# Patient Record
Sex: Female | Born: 1935 | Hispanic: Yes | Marital: Married | Smoking: Never smoker
Health system: Southern US, Community
[De-identification: ages and names within clinical notes are randomized; demographics above are authoritative.]

## PROBLEM LIST (undated history)

## (undated) DIAGNOSIS — C50919 Malignant neoplasm of unspecified site of unspecified female breast: Secondary | ICD-10-CM

## (undated) DIAGNOSIS — N289 Disorder of kidney and ureter, unspecified: Secondary | ICD-10-CM

## (undated) DIAGNOSIS — K219 Gastro-esophageal reflux disease without esophagitis: Secondary | ICD-10-CM

## (undated) DIAGNOSIS — D649 Anemia, unspecified: Secondary | ICD-10-CM

## (undated) DIAGNOSIS — M1990 Unspecified osteoarthritis, unspecified site: Secondary | ICD-10-CM

## (undated) DIAGNOSIS — R5383 Other fatigue: Secondary | ICD-10-CM

## (undated) DIAGNOSIS — E785 Hyperlipidemia, unspecified: Secondary | ICD-10-CM

## (undated) DIAGNOSIS — L0390 Cellulitis, unspecified: Secondary | ICD-10-CM

## (undated) DIAGNOSIS — N184 Chronic kidney disease, stage 4 (severe): Secondary | ICD-10-CM

## (undated) DIAGNOSIS — H3530 Unspecified macular degeneration: Secondary | ICD-10-CM

## (undated) DIAGNOSIS — IMO0002 Reserved for concepts with insufficient information to code with codable children: Secondary | ICD-10-CM

## (undated) DIAGNOSIS — F329 Major depressive disorder, single episode, unspecified: Secondary | ICD-10-CM

## (undated) DIAGNOSIS — F32A Depression, unspecified: Secondary | ICD-10-CM

## (undated) DIAGNOSIS — I10 Essential (primary) hypertension: Secondary | ICD-10-CM

## (undated) DIAGNOSIS — IMO0001 Reserved for inherently not codable concepts without codable children: Secondary | ICD-10-CM

## (undated) HISTORY — DX: Gastro-esophageal reflux disease without esophagitis: K21.9

## (undated) HISTORY — DX: Chronic kidney disease, stage 4 (severe): N18.4

## (undated) HISTORY — DX: Anemia, unspecified: D64.9

## (undated) HISTORY — DX: Malignant neoplasm of unspecified site of unspecified female breast: C50.919

## (undated) HISTORY — DX: Unspecified osteoarthritis, unspecified site: M19.90

## (undated) HISTORY — DX: Disorder of kidney and ureter, unspecified: N28.9

## (undated) HISTORY — DX: Depression, unspecified: F32.A

## (undated) HISTORY — DX: Major depressive disorder, single episode, unspecified: F32.9

## (undated) HISTORY — DX: Hyperlipidemia, unspecified: E78.5

## (undated) HISTORY — DX: Essential (primary) hypertension: I10

## (undated) HISTORY — DX: Unspecified macular degeneration: H35.30

---
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
## 1997-10-11 HISTORY — PX: MASTECTOMY: SHX3

## 1997-10-11 HISTORY — PX: LUNG BIOPSY: SHX232

## 2000-04-17 ENCOUNTER — Inpatient Hospital Stay (HOSPITAL_COMMUNITY): Admission: EM | Admit: 2000-04-17 | Discharge: 2000-04-19 | Payer: Self-pay | Admitting: Emergency Medicine

## 2000-04-17 ENCOUNTER — Encounter: Payer: Self-pay | Admitting: Internal Medicine

## 2000-05-09 ENCOUNTER — Ambulatory Visit (HOSPITAL_BASED_OUTPATIENT_CLINIC_OR_DEPARTMENT_OTHER): Admission: RE | Admit: 2000-05-09 | Discharge: 2000-05-09 | Payer: Self-pay | Admitting: General Surgery

## 2000-06-17 ENCOUNTER — Encounter: Admission: RE | Admit: 2000-06-17 | Discharge: 2000-06-17 | Payer: Self-pay | Admitting: Gynecology

## 2000-06-17 ENCOUNTER — Encounter: Payer: Self-pay | Admitting: Gynecology

## 2000-06-21 ENCOUNTER — Ambulatory Visit (HOSPITAL_COMMUNITY): Admission: RE | Admit: 2000-06-21 | Discharge: 2000-06-21 | Payer: Self-pay | Admitting: Internal Medicine

## 2000-07-06 ENCOUNTER — Encounter: Payer: Self-pay | Admitting: Oncology

## 2000-07-06 ENCOUNTER — Encounter: Admission: RE | Admit: 2000-07-06 | Discharge: 2000-07-06 | Payer: Self-pay | Admitting: Oncology

## 2000-07-14 ENCOUNTER — Encounter: Admission: RE | Admit: 2000-07-14 | Discharge: 2000-08-17 | Payer: Self-pay | Admitting: Oncology

## 2001-06-22 ENCOUNTER — Other Ambulatory Visit: Admission: RE | Admit: 2001-06-22 | Discharge: 2001-06-22 | Payer: Self-pay | Admitting: Gynecology

## 2001-06-28 ENCOUNTER — Encounter: Payer: Self-pay | Admitting: Internal Medicine

## 2001-07-11 ENCOUNTER — Encounter: Payer: Self-pay | Admitting: Oncology

## 2001-07-11 ENCOUNTER — Encounter: Admission: RE | Admit: 2001-07-11 | Discharge: 2001-07-11 | Payer: Self-pay | Admitting: Oncology

## 2002-01-31 ENCOUNTER — Ambulatory Visit (HOSPITAL_COMMUNITY): Admission: RE | Admit: 2002-01-31 | Discharge: 2002-01-31 | Payer: Self-pay | Admitting: Oncology

## 2002-01-31 ENCOUNTER — Encounter: Payer: Self-pay | Admitting: Oncology

## 2002-05-17 ENCOUNTER — Encounter (INDEPENDENT_AMBULATORY_CARE_PROVIDER_SITE_OTHER): Payer: Self-pay | Admitting: Specialist

## 2002-05-17 ENCOUNTER — Ambulatory Visit (HOSPITAL_BASED_OUTPATIENT_CLINIC_OR_DEPARTMENT_OTHER): Admission: RE | Admit: 2002-05-17 | Discharge: 2002-05-17 | Payer: Self-pay | Admitting: Gynecology

## 2002-05-31 ENCOUNTER — Emergency Department (HOSPITAL_COMMUNITY): Admission: EM | Admit: 2002-05-31 | Discharge: 2002-05-31 | Payer: Self-pay | Admitting: Emergency Medicine

## 2002-06-14 ENCOUNTER — Ambulatory Visit (HOSPITAL_COMMUNITY): Admission: RE | Admit: 2002-06-14 | Discharge: 2002-06-14 | Payer: Self-pay | Admitting: Orthopedic Surgery

## 2002-06-14 ENCOUNTER — Encounter: Payer: Self-pay | Admitting: Orthopedic Surgery

## 2002-07-16 ENCOUNTER — Encounter: Payer: Self-pay | Admitting: Oncology

## 2002-07-16 ENCOUNTER — Encounter: Admission: RE | Admit: 2002-07-16 | Discharge: 2002-07-16 | Payer: Self-pay | Admitting: Oncology

## 2003-01-29 ENCOUNTER — Encounter: Payer: Self-pay | Admitting: Oncology

## 2003-01-29 ENCOUNTER — Ambulatory Visit (HOSPITAL_COMMUNITY): Admission: RE | Admit: 2003-01-29 | Discharge: 2003-01-29 | Payer: Self-pay | Admitting: Oncology

## 2003-07-04 ENCOUNTER — Other Ambulatory Visit: Admission: RE | Admit: 2003-07-04 | Discharge: 2003-07-04 | Payer: Self-pay | Admitting: Gynecology

## 2003-08-02 ENCOUNTER — Encounter: Payer: Self-pay | Admitting: Oncology

## 2003-08-02 ENCOUNTER — Encounter: Admission: RE | Admit: 2003-08-02 | Discharge: 2003-08-02 | Payer: Self-pay | Admitting: Oncology

## 2003-08-02 ENCOUNTER — Emergency Department (HOSPITAL_COMMUNITY): Admission: EM | Admit: 2003-08-02 | Discharge: 2003-08-02 | Payer: Self-pay

## 2003-08-03 ENCOUNTER — Emergency Department (HOSPITAL_COMMUNITY): Admission: AD | Admit: 2003-08-03 | Discharge: 2003-08-03 | Payer: Self-pay | Admitting: *Deleted

## 2004-01-26 ENCOUNTER — Emergency Department (HOSPITAL_COMMUNITY): Admission: EM | Admit: 2004-01-26 | Discharge: 2004-01-27 | Payer: Self-pay | Admitting: Emergency Medicine

## 2004-07-06 ENCOUNTER — Other Ambulatory Visit: Admission: RE | Admit: 2004-07-06 | Discharge: 2004-07-06 | Payer: Self-pay | Admitting: Gynecology

## 2004-08-03 ENCOUNTER — Encounter: Admission: RE | Admit: 2004-08-03 | Discharge: 2004-08-03 | Payer: Self-pay | Admitting: Oncology

## 2004-08-19 ENCOUNTER — Ambulatory Visit: Payer: Self-pay | Admitting: Internal Medicine

## 2004-10-16 ENCOUNTER — Ambulatory Visit: Payer: Self-pay | Admitting: Internal Medicine

## 2004-11-09 ENCOUNTER — Ambulatory Visit: Payer: Self-pay | Admitting: Internal Medicine

## 2005-01-08 ENCOUNTER — Ambulatory Visit: Payer: Self-pay | Admitting: Internal Medicine

## 2005-01-15 ENCOUNTER — Ambulatory Visit: Payer: Self-pay | Admitting: Oncology

## 2005-02-01 ENCOUNTER — Ambulatory Visit: Payer: Self-pay | Admitting: Internal Medicine

## 2005-03-01 ENCOUNTER — Ambulatory Visit: Payer: Self-pay | Admitting: Internal Medicine

## 2005-03-31 ENCOUNTER — Ambulatory Visit: Payer: Self-pay | Admitting: Internal Medicine

## 2005-04-02 ENCOUNTER — Encounter: Admission: RE | Admit: 2005-04-02 | Discharge: 2005-04-02 | Payer: Self-pay | Admitting: Internal Medicine

## 2005-05-11 ENCOUNTER — Ambulatory Visit: Payer: Self-pay | Admitting: Internal Medicine

## 2005-05-17 ENCOUNTER — Encounter: Admission: RE | Admit: 2005-05-17 | Discharge: 2005-06-23 | Payer: Self-pay | Admitting: Internal Medicine

## 2005-06-01 ENCOUNTER — Ambulatory Visit: Payer: Self-pay | Admitting: Internal Medicine

## 2005-06-04 ENCOUNTER — Encounter: Admission: RE | Admit: 2005-06-04 | Discharge: 2005-06-04 | Payer: Self-pay | Admitting: Internal Medicine

## 2005-07-12 ENCOUNTER — Other Ambulatory Visit: Admission: RE | Admit: 2005-07-12 | Discharge: 2005-07-12 | Payer: Self-pay | Admitting: Gynecology

## 2005-07-30 ENCOUNTER — Ambulatory Visit: Payer: Self-pay | Admitting: Internal Medicine

## 2005-08-05 ENCOUNTER — Encounter: Admission: RE | Admit: 2005-08-05 | Discharge: 2005-08-05 | Payer: Self-pay | Admitting: Gynecology

## 2005-09-28 ENCOUNTER — Ambulatory Visit: Payer: Self-pay | Admitting: Internal Medicine

## 2006-01-31 ENCOUNTER — Ambulatory Visit: Payer: Self-pay | Admitting: Oncology

## 2006-02-01 LAB — CBC WITH DIFFERENTIAL/PLATELET
BASO%: 0.3 % (ref 0.0–2.0)
Basophils Absolute: 0 10*3/uL (ref 0.0–0.1)
EOS%: 2 % (ref 0.0–7.0)
HCT: 33.1 % — ABNORMAL LOW (ref 34.8–46.6)
HGB: 11.2 g/dL — ABNORMAL LOW (ref 11.6–15.9)
LYMPH%: 27.9 % (ref 14.0–48.0)
MCH: 31.7 pg (ref 26.0–34.0)
MCHC: 33.9 g/dL (ref 32.0–36.0)
MCV: 93.5 fL (ref 81.0–101.0)
MONO%: 5.9 % (ref 0.0–13.0)
NEUT%: 63.9 % (ref 39.6–76.8)
Platelets: 193 10*3/uL (ref 145–400)

## 2006-02-01 LAB — COMPREHENSIVE METABOLIC PANEL
ALT: 24 U/L (ref 0–40)
AST: 28 U/L (ref 0–37)
BUN: 39 mg/dL — ABNORMAL HIGH (ref 6–23)
Calcium: 10 mg/dL (ref 8.4–10.5)
Chloride: 103 mEq/L (ref 96–112)
Creatinine, Ser: 2.1 mg/dL — ABNORMAL HIGH (ref 0.4–1.2)
Total Bilirubin: 0.4 mg/dL (ref 0.3–1.2)

## 2006-06-01 ENCOUNTER — Ambulatory Visit: Payer: Self-pay | Admitting: Internal Medicine

## 2006-06-07 ENCOUNTER — Ambulatory Visit: Payer: Self-pay | Admitting: Internal Medicine

## 2006-06-16 ENCOUNTER — Ambulatory Visit: Payer: Self-pay

## 2006-07-14 ENCOUNTER — Other Ambulatory Visit: Admission: RE | Admit: 2006-07-14 | Discharge: 2006-07-14 | Payer: Self-pay | Admitting: Gynecology

## 2006-08-08 ENCOUNTER — Encounter: Admission: RE | Admit: 2006-08-08 | Discharge: 2006-08-08 | Payer: Self-pay | Admitting: Gynecology

## 2006-08-24 ENCOUNTER — Ambulatory Visit: Payer: Self-pay | Admitting: Internal Medicine

## 2006-12-16 ENCOUNTER — Ambulatory Visit: Payer: Self-pay | Admitting: Internal Medicine

## 2006-12-16 LAB — CONVERTED CEMR LAB
ALT: 25 units/L (ref 0–40)
AST: 31 units/L (ref 0–37)
BUN: 43 mg/dL — ABNORMAL HIGH (ref 6–23)
CO2: 29 meq/L (ref 19–32)
Calcium: 10.4 mg/dL (ref 8.4–10.5)
Chloride: 106 meq/L (ref 96–112)
Creatinine, Ser: 1.8 mg/dL — ABNORMAL HIGH (ref 0.4–1.2)
GFR calc Af Amer: 36 mL/min
GFR calc non Af Amer: 30 mL/min
Glucose, Bld: 132 mg/dL — ABNORMAL HIGH (ref 70–99)
Hgb A1c MFr Bld: 7.1 % — ABNORMAL HIGH (ref 4.6–6.0)
Potassium: 4.6 meq/L (ref 3.5–5.1)
Sodium: 143 meq/L (ref 135–145)

## 2007-01-19 ENCOUNTER — Ambulatory Visit: Payer: Self-pay | Admitting: Internal Medicine

## 2007-01-20 ENCOUNTER — Ambulatory Visit: Payer: Self-pay | Admitting: Oncology

## 2007-01-25 LAB — CBC WITH DIFFERENTIAL/PLATELET
BASO%: 0.5 % (ref 0.0–2.0)
Basophils Absolute: 0 10*3/uL (ref 0.0–0.1)
EOS%: 1.5 % (ref 0.0–7.0)
HCT: 30.8 % — ABNORMAL LOW (ref 34.8–46.6)
HGB: 10.5 g/dL — ABNORMAL LOW (ref 11.6–15.9)
LYMPH%: 36 % (ref 14.0–48.0)
MCH: 31.3 pg (ref 26.0–34.0)
MCHC: 34.2 g/dL (ref 32.0–36.0)
MONO#: 0.6 10*3/uL (ref 0.1–0.9)
NEUT%: 52.2 % (ref 39.6–76.8)
Platelets: 174 10*3/uL (ref 145–400)
lymph#: 2.3 10*3/uL (ref 0.9–3.3)

## 2007-01-25 LAB — COMPREHENSIVE METABOLIC PANEL
BUN: 51 mg/dL — ABNORMAL HIGH (ref 6–23)
CO2: 28 mEq/L (ref 19–32)
Calcium: 9.5 mg/dL (ref 8.4–10.5)
Chloride: 102 mEq/L (ref 96–112)
Creatinine, Ser: 2.34 mg/dL — ABNORMAL HIGH (ref 0.40–1.20)
Total Bilirubin: 0.4 mg/dL (ref 0.3–1.2)

## 2007-01-25 LAB — CANCER ANTIGEN 27.29: CA 27.29: 16 U/mL (ref 0–39)

## 2007-02-15 ENCOUNTER — Ambulatory Visit: Payer: Self-pay | Admitting: Internal Medicine

## 2007-02-28 DIAGNOSIS — E118 Type 2 diabetes mellitus with unspecified complications: Secondary | ICD-10-CM | POA: Insufficient documentation

## 2007-02-28 DIAGNOSIS — E785 Hyperlipidemia, unspecified: Secondary | ICD-10-CM | POA: Insufficient documentation

## 2007-02-28 DIAGNOSIS — D649 Anemia, unspecified: Secondary | ICD-10-CM

## 2007-02-28 DIAGNOSIS — E119 Type 2 diabetes mellitus without complications: Secondary | ICD-10-CM

## 2007-02-28 DIAGNOSIS — M109 Gout, unspecified: Secondary | ICD-10-CM | POA: Insufficient documentation

## 2007-02-28 DIAGNOSIS — I10 Essential (primary) hypertension: Secondary | ICD-10-CM

## 2007-03-16 ENCOUNTER — Ambulatory Visit: Payer: Self-pay | Admitting: Internal Medicine

## 2007-04-10 ENCOUNTER — Encounter (INDEPENDENT_AMBULATORY_CARE_PROVIDER_SITE_OTHER): Payer: Self-pay | Admitting: *Deleted

## 2007-06-12 LAB — CONVERTED CEMR LAB: Pap Smear: NORMAL

## 2007-06-15 ENCOUNTER — Telehealth (INDEPENDENT_AMBULATORY_CARE_PROVIDER_SITE_OTHER): Payer: Self-pay | Admitting: *Deleted

## 2007-07-07 ENCOUNTER — Ambulatory Visit: Payer: Self-pay | Admitting: Internal Medicine

## 2007-07-07 DIAGNOSIS — N259 Disorder resulting from impaired renal tubular function, unspecified: Secondary | ICD-10-CM

## 2007-07-07 DIAGNOSIS — M1990 Unspecified osteoarthritis, unspecified site: Secondary | ICD-10-CM

## 2007-07-07 DIAGNOSIS — C50919 Malignant neoplasm of unspecified site of unspecified female breast: Secondary | ICD-10-CM | POA: Insufficient documentation

## 2007-07-10 LAB — CONVERTED CEMR LAB
ALT: 23 units/L (ref 0–35)
AST: 24 units/L (ref 0–37)
BUN: 55 mg/dL — ABNORMAL HIGH (ref 6–23)
Basophils Absolute: 0 10*3/uL (ref 0.0–0.1)
Basophils Relative: 0.7 % (ref 0.0–1.0)
CO2: 30 meq/L (ref 19–32)
Calcium: 10 mg/dL (ref 8.4–10.5)
Chloride: 105 meq/L (ref 96–112)
Cholesterol: 166 mg/dL (ref 0–200)
Creatinine, Ser: 2 mg/dL — ABNORMAL HIGH (ref 0.4–1.2)
Creatinine,U: 33.8 mg/dL
Eosinophils Absolute: 0.1 10*3/uL (ref 0.0–0.6)
Eosinophils Relative: 1.9 % (ref 0.0–5.0)
Folate: 13.3 ng/mL
GFR calc Af Amer: 32 mL/min
GFR calc non Af Amer: 26 mL/min
Glucose, Bld: 108 mg/dL — ABNORMAL HIGH (ref 70–99)
HCT: 32.4 % — ABNORMAL LOW (ref 36.0–46.0)
HDL: 42.6 mg/dL (ref >39.0)
Hemoglobin: 11.2 g/dL — ABNORMAL LOW (ref 12.0–15.0)
Hgb A1c MFr Bld: 6.8 % — ABNORMAL HIGH (ref 4.6–6.0)
Iron: 68 ug/dL (ref 42–145)
LDL Cholesterol: 83 mg/dL (ref 0–99)
Lymphocytes Relative: 34.2 % (ref 12.0–46.0)
MCHC: 34.4 g/dL (ref 30.0–36.0)
MCV: 91.9 fL (ref 78.0–100.0)
Microalb Creat Ratio: 5.9 mg/g (ref 0.0–30.0)
Microalb, Ur: 0.2 mg/dL (ref 0.0–1.9)
Monocytes Absolute: 0.4 10*3/uL (ref 0.2–0.7)
Monocytes Relative: 7.5 % (ref 3.0–11.0)
Neutro Abs: 3.3 10*3/uL (ref 1.4–7.7)
Neutrophils Relative %: 55.7 % (ref 43.0–77.0)
Platelets: 172 10*3/uL (ref 150–400)
Potassium: 4.8 meq/L (ref 3.5–5.1)
RBC: 3.53 M/uL — ABNORMAL LOW (ref 3.87–5.11)
RDW: 15 % — ABNORMAL HIGH (ref 11.5–14.6)
Sodium: 141 meq/L (ref 135–145)
TSH: 1.87 microintl units/mL (ref 0.35–5.50)
Total CHOL/HDL Ratio: 3.9
Triglycerides: 200 mg/dL — ABNORMAL HIGH (ref 0–149)
VLDL: 40 mg/dL (ref 0–40)
Vitamin B-12: 199 pg/mL — ABNORMAL LOW (ref 211–911)
WBC: 5.8 10*3/uL (ref 4.5–10.5)

## 2007-07-21 ENCOUNTER — Encounter: Payer: Self-pay | Admitting: Internal Medicine

## 2007-08-10 ENCOUNTER — Encounter: Admission: RE | Admit: 2007-08-10 | Discharge: 2007-08-10 | Payer: Self-pay | Admitting: Gynecology

## 2007-08-10 ENCOUNTER — Encounter: Payer: Self-pay | Admitting: Internal Medicine

## 2007-08-14 ENCOUNTER — Encounter (INDEPENDENT_AMBULATORY_CARE_PROVIDER_SITE_OTHER): Payer: Self-pay | Admitting: *Deleted

## 2007-12-19 ENCOUNTER — Ambulatory Visit: Payer: Self-pay | Admitting: Internal Medicine

## 2007-12-19 DIAGNOSIS — K219 Gastro-esophageal reflux disease without esophagitis: Secondary | ICD-10-CM | POA: Insufficient documentation

## 2007-12-22 LAB — CONVERTED CEMR LAB
BUN: 45 mg/dL — ABNORMAL HIGH (ref 6–23)
CO2: 28 meq/L (ref 19–32)
Calcium: 10.1 mg/dL (ref 8.4–10.5)
Chloride: 107 meq/L (ref 96–112)
Creatinine, Ser: 2 mg/dL — ABNORMAL HIGH (ref 0.4–1.2)
Creatinine,U: 30.2 mg/dL
GFR calc Af Amer: 32 mL/min
GFR calc non Af Amer: 26 mL/min
Glucose, Bld: 107 mg/dL — ABNORMAL HIGH (ref 70–99)
Hgb A1c MFr Bld: 6.7 % — ABNORMAL HIGH (ref 4.6–6.0)
Microalb Creat Ratio: 33.1 mg/g — ABNORMAL HIGH (ref 0.0–30.0)
Microalb, Ur: 1 mg/dL (ref 0.0–1.9)
Potassium: 4.5 meq/L (ref 3.5–5.1)
Sodium: 142 meq/L (ref 135–145)

## 2008-01-29 ENCOUNTER — Ambulatory Visit: Payer: Self-pay | Admitting: Oncology

## 2008-01-31 ENCOUNTER — Encounter: Payer: Self-pay | Admitting: Internal Medicine

## 2008-01-31 LAB — COMPREHENSIVE METABOLIC PANEL
ALT: 19 U/L (ref 0–35)
AST: 20 U/L (ref 0–37)
Alkaline Phosphatase: 65 U/L (ref 39–117)
CO2: 27 mEq/L (ref 19–32)
Creatinine, Ser: 2.1 mg/dL — ABNORMAL HIGH (ref 0.40–1.20)
Sodium: 139 mEq/L (ref 135–145)
Total Bilirubin: 0.4 mg/dL (ref 0.3–1.2)
Total Protein: 7.7 g/dL (ref 6.0–8.3)

## 2008-01-31 LAB — CBC WITH DIFFERENTIAL/PLATELET
BASO%: 0.1 % (ref 0.0–2.0)
EOS%: 2.6 % (ref 0.0–7.0)
LYMPH%: 32.7 % (ref 14.0–48.0)
MCH: 31.5 pg (ref 26.0–34.0)
MCHC: 34.2 g/dL (ref 32.0–36.0)
MONO#: 0.4 10*3/uL (ref 0.1–0.9)
Platelets: 204 10*3/uL (ref 145–400)
RBC: 3.6 10*6/uL — ABNORMAL LOW (ref 3.70–5.32)
WBC: 5.5 10*3/uL (ref 3.9–10.0)
lymph#: 1.8 10*3/uL (ref 0.9–3.3)

## 2008-01-31 LAB — CANCER ANTIGEN 27.29: CA 27.29: 14 U/mL (ref 0–39)

## 2008-03-25 ENCOUNTER — Telehealth (INDEPENDENT_AMBULATORY_CARE_PROVIDER_SITE_OTHER): Payer: Self-pay | Admitting: *Deleted

## 2008-04-01 ENCOUNTER — Ambulatory Visit: Payer: Self-pay | Admitting: Internal Medicine

## 2008-04-01 DIAGNOSIS — E538 Deficiency of other specified B group vitamins: Secondary | ICD-10-CM | POA: Insufficient documentation

## 2008-04-04 ENCOUNTER — Encounter (INDEPENDENT_AMBULATORY_CARE_PROVIDER_SITE_OTHER): Payer: Self-pay | Admitting: *Deleted

## 2008-04-04 LAB — CONVERTED CEMR LAB
Creatinine, Ser: 1.9 mg/dL — ABNORMAL HIGH (ref 0.4–1.2)
Creatinine,U: 28.2 mg/dL
Folate: >20 ng/mL
Hgb A1c MFr Bld: 6.6 % — ABNORMAL HIGH (ref 4.6–6.0)
Microalb, Ur: 0.2 mg/dL (ref 0.0–1.9)
Vitamin B-12: 1101 pg/mL — ABNORMAL HIGH (ref 211–911)

## 2008-04-18 ENCOUNTER — Encounter (INDEPENDENT_AMBULATORY_CARE_PROVIDER_SITE_OTHER): Payer: Self-pay | Admitting: *Deleted

## 2008-07-22 ENCOUNTER — Encounter: Payer: Self-pay | Admitting: Internal Medicine

## 2008-08-12 ENCOUNTER — Encounter: Admission: RE | Admit: 2008-08-12 | Discharge: 2008-08-12 | Payer: Self-pay | Admitting: Internal Medicine

## 2008-08-19 ENCOUNTER — Telehealth (INDEPENDENT_AMBULATORY_CARE_PROVIDER_SITE_OTHER): Payer: Self-pay | Admitting: *Deleted

## 2008-09-09 ENCOUNTER — Telehealth (INDEPENDENT_AMBULATORY_CARE_PROVIDER_SITE_OTHER): Payer: Self-pay | Admitting: *Deleted

## 2008-11-08 ENCOUNTER — Telehealth (INDEPENDENT_AMBULATORY_CARE_PROVIDER_SITE_OTHER): Payer: Self-pay | Admitting: *Deleted

## 2008-11-27 ENCOUNTER — Ambulatory Visit: Payer: Self-pay | Admitting: Internal Medicine

## 2008-11-28 ENCOUNTER — Ambulatory Visit: Payer: Self-pay | Admitting: Internal Medicine

## 2008-12-03 ENCOUNTER — Telehealth (INDEPENDENT_AMBULATORY_CARE_PROVIDER_SITE_OTHER): Payer: Self-pay | Admitting: *Deleted

## 2008-12-03 LAB — CONVERTED CEMR LAB
ALT: 21 units/L (ref 0–35)
AST: 24 units/L (ref 0–37)
BUN: 52 mg/dL — ABNORMAL HIGH (ref 6–23)
Basophils Absolute: 0 10*3/uL (ref 0.0–0.1)
Basophils Relative: 0.6 % (ref 0.0–3.0)
CO2: 28 meq/L (ref 19–32)
Calcium: 9.5 mg/dL (ref 8.4–10.5)
Chloride: 106 meq/L (ref 96–112)
Cholesterol: 144 mg/dL (ref 0–200)
Creatinine, Ser: 2.1 mg/dL — ABNORMAL HIGH (ref 0.4–1.2)
Eosinophils Absolute: 0.2 10*3/uL (ref 0.0–0.7)
Eosinophils Relative: 2.5 % (ref 0.0–5.0)
GFR calc Af Amer: 30 mL/min
GFR calc non Af Amer: 25 mL/min
Glucose, Bld: 131 mg/dL — ABNORMAL HIGH (ref 70–99)
HCT: 33.1 % — ABNORMAL LOW (ref 36.0–46.0)
HDL: 47 mg/dL (ref >39.0)
Hemoglobin: 11 g/dL — ABNORMAL LOW (ref 12.0–15.0)
Hgb A1c MFr Bld: 6.5 % — ABNORMAL HIGH (ref 4.6–6.0)
LDL Cholesterol: 77 mg/dL (ref 0–99)
Lymphocytes Relative: 30.8 % (ref 12.0–46.0)
MCHC: 33.3 g/dL (ref 30.0–36.0)
MCV: 94.5 fL (ref 78.0–100.0)
Monocytes Absolute: 0.4 10*3/uL (ref 0.1–1.0)
Monocytes Relative: 5.8 % (ref 3.0–12.0)
Neutro Abs: 3.8 10*3/uL (ref 1.4–7.7)
Neutrophils Relative %: 60.3 % (ref 43.0–77.0)
Platelets: 161 10*3/uL (ref 150–400)
Potassium: 4.8 meq/L (ref 3.5–5.1)
RBC: 3.51 M/uL — ABNORMAL LOW (ref 3.87–5.11)
RDW: 15 % — ABNORMAL HIGH (ref 11.5–14.6)
Sodium: 141 meq/L (ref 135–145)
Total CHOL/HDL Ratio: 3.1
Triglycerides: 100 mg/dL (ref 0–149)
VLDL: 20 mg/dL (ref 0–40)
WBC: 6.4 10*3/uL (ref 4.5–10.5)

## 2009-01-03 ENCOUNTER — Ambulatory Visit: Payer: Self-pay | Admitting: Internal Medicine

## 2009-01-27 ENCOUNTER — Ambulatory Visit: Payer: Self-pay | Admitting: Internal Medicine

## 2009-01-28 ENCOUNTER — Ambulatory Visit: Payer: Self-pay | Admitting: Oncology

## 2009-01-30 ENCOUNTER — Encounter: Payer: Self-pay | Admitting: Internal Medicine

## 2009-01-30 LAB — CBC WITH DIFFERENTIAL/PLATELET
Basophils Absolute: 0 10*3/uL (ref 0.0–0.1)
Eosinophils Absolute: 0.1 10*3/uL (ref 0.0–0.5)
HCT: 33.3 % — ABNORMAL LOW (ref 34.8–46.6)
MCV: 93.4 fL (ref 79.5–101.0)
MONO#: 0.5 10*3/uL (ref 0.1–0.9)
Platelets: 180 10*3/uL (ref 145–400)
RDW: 15.7 % — ABNORMAL HIGH (ref 11.2–14.5)
lymph#: 2.4 10*3/uL (ref 0.9–3.3)

## 2009-01-31 LAB — COMPREHENSIVE METABOLIC PANEL
Albumin: 4 g/dL (ref 3.5–5.2)
BUN: 38 mg/dL — ABNORMAL HIGH (ref 6–23)
Calcium: 9.3 mg/dL (ref 8.4–10.5)
Chloride: 104 mEq/L (ref 96–112)
Glucose, Bld: 108 mg/dL — ABNORMAL HIGH (ref 70–99)
Potassium: 4.6 mEq/L (ref 3.5–5.3)
Total Protein: 7.1 g/dL (ref 6.0–8.3)

## 2009-01-31 LAB — VITAMIN D 25 HYDROXY (VIT D DEFICIENCY, FRACTURES): Vit D, 25-Hydroxy: 77 ng/mL (ref 30–89)

## 2009-03-06 ENCOUNTER — Ambulatory Visit: Payer: Self-pay | Admitting: Internal Medicine

## 2009-03-06 ENCOUNTER — Encounter: Payer: Self-pay | Admitting: Internal Medicine

## 2009-03-06 ENCOUNTER — Inpatient Hospital Stay (HOSPITAL_COMMUNITY): Admission: EM | Admit: 2009-03-06 | Discharge: 2009-03-07 | Payer: Self-pay | Admitting: Emergency Medicine

## 2009-04-04 ENCOUNTER — Ambulatory Visit: Payer: Self-pay | Admitting: Internal Medicine

## 2009-04-16 ENCOUNTER — Encounter (INDEPENDENT_AMBULATORY_CARE_PROVIDER_SITE_OTHER): Payer: Self-pay | Admitting: *Deleted

## 2009-04-16 LAB — CONVERTED CEMR LAB
Basophils Absolute: 0 10*3/uL (ref 0.0–0.1)
Basophils Relative: 0.8 % (ref 0.0–3.0)
Eosinophils Absolute: 0.2 10*3/uL (ref 0.0–0.7)
Eosinophils Relative: 3.7 % (ref 0.0–5.0)
Folate: >20 ng/mL
HCT: 29.7 % — ABNORMAL LOW (ref 36.0–46.0)
Hemoglobin: 10.1 g/dL — ABNORMAL LOW (ref 12.0–15.0)
Hgb A1c MFr Bld: 6.6 % — ABNORMAL HIGH (ref 4.6–6.5)
Iron: 52 ug/dL (ref 42–145)
Lymphocytes Relative: 40.2 % (ref 12.0–46.0)
Lymphs Abs: 2.2 10*3/uL (ref 0.7–4.0)
MCHC: 34.1 g/dL (ref 30.0–36.0)
MCV: 93.2 fL (ref 78.0–100.0)
Monocytes Absolute: 0.4 10*3/uL (ref 0.1–1.0)
Monocytes Relative: 6.9 % (ref 3.0–12.0)
Neutro Abs: 2.7 10*3/uL (ref 1.4–7.7)
Neutrophils Relative %: 48.4 % (ref 43.0–77.0)
Platelets: 161 10*3/uL (ref 150.0–400.0)
RBC: 3.19 M/uL — ABNORMAL LOW (ref 3.87–5.11)
RDW: 15 % — ABNORMAL HIGH (ref 11.5–14.6)
Saturation Ratios: 18.7 % — ABNORMAL LOW (ref 20.0–50.0)
Transferrin: 198.1 mg/dL — ABNORMAL LOW (ref 212.0–360.0)
Vitamin B-12: 599 pg/mL (ref 211–911)
WBC: 5.5 10*3/uL (ref 4.5–10.5)

## 2009-07-24 ENCOUNTER — Encounter: Payer: Self-pay | Admitting: Internal Medicine

## 2009-08-13 ENCOUNTER — Encounter: Admission: RE | Admit: 2009-08-13 | Discharge: 2009-08-13 | Payer: Self-pay | Admitting: Gynecology

## 2009-08-13 LAB — HM MAMMOGRAPHY: HM Mammogram: NORMAL

## 2009-10-11 DIAGNOSIS — H3530 Unspecified macular degeneration: Secondary | ICD-10-CM

## 2009-10-11 HISTORY — DX: Unspecified macular degeneration: H35.30

## 2009-10-20 ENCOUNTER — Encounter: Payer: Self-pay | Admitting: Internal Medicine

## 2009-12-15 ENCOUNTER — Ambulatory Visit: Payer: Self-pay | Admitting: Internal Medicine

## 2009-12-15 LAB — CONVERTED CEMR LAB
Cholesterol, target level: 200 mg/dL
HDL goal, serum: 40 mg/dL
LDL Goal: 100 mg/dL

## 2009-12-19 LAB — CONVERTED CEMR LAB
ALT: 19 units/L (ref 0–35)
AST: 23 units/L (ref 0–37)
BUN: 34 mg/dL — ABNORMAL HIGH (ref 6–23)
Basophils Absolute: 0 10*3/uL (ref 0.0–0.1)
Basophils Relative: 0.9 % (ref 0.0–3.0)
CO2: 30 meq/L (ref 19–32)
Calcium: 9 mg/dL (ref 8.4–10.5)
Chloride: 106 meq/L (ref 96–112)
Cholesterol: 192 mg/dL (ref 0–200)
Creatinine, Ser: 2 mg/dL — ABNORMAL HIGH (ref 0.4–1.2)
Direct LDL: 96.6 mg/dL
Eosinophils Absolute: 0.2 10*3/uL (ref 0.0–0.7)
Eosinophils Relative: 2.9 % (ref 0.0–5.0)
Folate: >20 ng/mL
GFR calc non Af Amer: 26.08 mL/min (ref >60)
Glucose, Bld: 128 mg/dL — ABNORMAL HIGH (ref 70–99)
HCT: 33.9 % — ABNORMAL LOW (ref 36.0–46.0)
HDL: 55.8 mg/dL (ref >39.00)
Hemoglobin: 11.2 g/dL — ABNORMAL LOW (ref 12.0–15.0)
Hgb A1c MFr Bld: 6.6 % — ABNORMAL HIGH (ref 4.6–6.5)
Lymphocytes Relative: 31.1 % (ref 12.0–46.0)
Lymphs Abs: 1.7 10*3/uL (ref 0.7–4.0)
MCHC: 33.2 g/dL (ref 30.0–36.0)
MCV: 95.7 fL (ref 78.0–100.0)
Monocytes Absolute: 0.4 10*3/uL (ref 0.1–1.0)
Monocytes Relative: 7.1 % (ref 3.0–12.0)
Neutro Abs: 3.1 10*3/uL (ref 1.4–7.7)
Neutrophils Relative %: 58 % (ref 43.0–77.0)
Platelets: 182 10*3/uL (ref 150.0–400.0)
Potassium: 4.5 meq/L (ref 3.5–5.1)
RBC: 3.54 M/uL — ABNORMAL LOW (ref 3.87–5.11)
RDW: 14.5 % (ref 11.5–14.6)
Sodium: 142 meq/L (ref 135–145)
TSH: 2.49 microintl units/mL (ref 0.35–5.50)
Total CHOL/HDL Ratio: 3
Triglycerides: 205 mg/dL — ABNORMAL HIGH (ref 0.0–149.0)
Uric Acid, Serum: 6.5 mg/dL (ref 2.4–7.0)
VLDL: 41 mg/dL — ABNORMAL HIGH (ref 0.0–40.0)
Vitamin B-12: 658 pg/mL (ref 211–911)
WBC: 5.4 10*3/uL (ref 4.5–10.5)

## 2010-01-29 ENCOUNTER — Telehealth (INDEPENDENT_AMBULATORY_CARE_PROVIDER_SITE_OTHER): Payer: Self-pay | Admitting: *Deleted

## 2010-02-11 ENCOUNTER — Encounter: Payer: Self-pay | Admitting: Internal Medicine

## 2010-02-27 ENCOUNTER — Telehealth (INDEPENDENT_AMBULATORY_CARE_PROVIDER_SITE_OTHER): Payer: Self-pay | Admitting: *Deleted

## 2010-03-10 ENCOUNTER — Telehealth (INDEPENDENT_AMBULATORY_CARE_PROVIDER_SITE_OTHER): Payer: Self-pay | Admitting: *Deleted

## 2010-04-06 ENCOUNTER — Telehealth (INDEPENDENT_AMBULATORY_CARE_PROVIDER_SITE_OTHER): Payer: Self-pay | Admitting: *Deleted

## 2010-07-07 ENCOUNTER — Telehealth (INDEPENDENT_AMBULATORY_CARE_PROVIDER_SITE_OTHER): Payer: Self-pay | Admitting: *Deleted

## 2010-08-03 ENCOUNTER — Encounter: Payer: Self-pay | Admitting: Internal Medicine

## 2010-08-14 ENCOUNTER — Encounter: Admission: RE | Admit: 2010-08-14 | Discharge: 2010-08-14 | Payer: Self-pay | Admitting: Internal Medicine

## 2010-09-08 ENCOUNTER — Telehealth (INDEPENDENT_AMBULATORY_CARE_PROVIDER_SITE_OTHER): Payer: Self-pay | Admitting: *Deleted

## 2010-09-09 ENCOUNTER — Ambulatory Visit: Payer: Self-pay | Admitting: Internal Medicine

## 2010-09-09 LAB — HM DIABETES FOOT EXAM

## 2010-09-14 LAB — CONVERTED CEMR LAB
BUN: 41 mg/dL — ABNORMAL HIGH (ref 6–23)
CO2: 28 meq/L (ref 19–32)
Calcium: 9.7 mg/dL (ref 8.4–10.5)
Chloride: 105 meq/L (ref 96–112)
Creatinine, Ser: 2.3 mg/dL — ABNORMAL HIGH (ref 0.4–1.2)
GFR calc non Af Amer: 22.72 mL/min (ref >60)
Glucose, Bld: 128 mg/dL — ABNORMAL HIGH (ref 70–99)
Hemoglobin: 11.4 g/dL — ABNORMAL LOW (ref 12.0–15.0)
Hgb A1c MFr Bld: 6.7 % — ABNORMAL HIGH (ref 4.6–6.5)
Iron: 76 ug/dL (ref 42–145)
Potassium: 5.1 meq/L (ref 3.5–5.1)
Saturation Ratios: 21 % (ref 20.0–50.0)
Sodium: 142 meq/L (ref 135–145)
Transferrin: 258.2 mg/dL (ref 212.0–360.0)

## 2010-10-11 HISTORY — PX: OTHER SURGICAL HISTORY: SHX169

## 2010-10-26 ENCOUNTER — Ambulatory Visit
Admission: RE | Admit: 2010-10-26 | Discharge: 2010-10-26 | Payer: Self-pay | Source: Home / Self Care | Attending: Internal Medicine | Admitting: Internal Medicine

## 2010-11-10 NOTE — Letter (Signed)
Summary: [facility], followup in one year  [place] By: Edmonia James 02/23/2010 08:30:15  _____________________________________________________________________  External Attachment:    Type:   Image     Comment:   External Document  Appended Document: [facility] stable, followup in one year

## 2010-11-10 NOTE — Progress Notes (Signed)
Summary: Refill Request  Phone Note Refill Request Call back at [phone] Message from:  Pharmacy on Mar 10, 2010 8:28 AM  Refills Requested: Medication #1:  SIMVASTATIN 40 MG TABS 1po qd   Dosage confirmed as above?Dosage Confirmed   Supply Requested: 3 months  Medication #2:  METOPROLOL TARTRATE 50 MG TABS 1 by mouth once daily   Dosage confirmed as above?Dosage Confirmed   Supply Requested: 3 months   Notes: Need new rx for [facility] supply [facility] [address]  Next Appointment Scheduled: none Initial call taken by: Elna Breslow,  Mar 10, 2010 8:29 AM    Prescriptions: SIMVASTATIN 40 MG TABS (SIMVASTATIN) 1po qd  #90 x 0   Entered by:   Dawson Bills   Authorized by:   Alda Berthold. Paz MD   Signed by:   Dawson Bills on 03/10/2010   Method used:   Electronically to        [facility] [phone]* (retail)       [place], [place]  [address]       Ph: [phone]       Fax: [phone]   RxID:   [id number] METOPROLOL TARTRATE 50 MG TABS (METOPROLOL TARTRATE) 1 by mouth once daily  #90 x 0   Entered by:   Dawson Bills   Authorized by:   Alda Berthold. Paz MD   Signed by:   Dawson Bills on 03/10/2010   Method used:   Electronically to        [facility] [phone]* (retail)       [address]       [place], [place]  [address]       Ph: [phone]       Fax: [phone]   RxID:   [id number]

## 2010-11-10 NOTE — Assessment & Plan Note (Signed)
Summary: f/u appt//pt will be fasting//lch    Vital Signs:  Patient profile:   75 year old female Weight:      142.13 pounds Pulse rate:   74 / minute Pulse rhythm:   regular BP sitting:   126 / 84  (left arm) Cuff size:   regular  Vitals Entered By: Allyn Kenner CMA (September 09, 2010 8:26 AM) CC: Follow up visit- fasting Comments c/o having neck pain, getting worse overtime. Feels like it "cracks" when turning head. [facility] [address]   History of Present Illness: ROV  OA-- c/o neck pain  x  ~ 1 year, slightly  worse  pain not constant , worse w/ hyperextension no radiation  h/o breast cancer--saw gynecology  10- 11, had a  negative breast exam  had flu shot 10-11at  gynecology  Diabetes -- no ambulatory CBGs    Hyperlipidemia-- good medication compliance except x 1 day (runned out)  Hypertension-- ambulatory BPs varies but  ~ 120-130  Renal insufficiency--saw nephrology 5-11, note reviewed: Stable, followup one year        Current Medications (verified): 1)  Diovan 320 Mg Tabs (Valsartan) .Marland Kitchen.. 1 By Mouth Once Daily 2)  Metoprolol Tartrate 50 Mg Tabs (Metoprolol Tartrate) .Marland Kitchen.. 1 By Mouth Once Daily 3)  Simvastatin 40 Mg Tabs (Simvastatin) .Marland Kitchen.. 1po Once Daily. Due For Office Visit Before Additional Refills. 4)  Allopurinol 100 Mg Tabs (Allopurinol) .Marland Kitchen.. 1 By Mouth Once Daily.  Due For Office Visit. 5)  Nexium 40 Mg  Cpdr (Esomeprazole Magnesium) .Marland Kitchen.. 1 By Mouth Two Times A Day Before Meals 6)  Allegra 60 Mg  Tabs (Fexofenadine Hcl) .Marland Kitchen.. 1 By Mouth Qd 7)  Aspir-Low 81 Mg  Tbec (Aspirin) 8)  Amlodipine Besylate 5 Mg Tabs (Amlodipine Besylate) .Marland Kitchen.. 1 By Mouth Once Daily  Allergies (verified): 1)  ! Sulfa 2)  ! Codeine 3)  ! Percocet 4)  ! Morphine 5)  ! Ultram  Past History:  Past Medical History: h/o breast cancer--- surgery,chemo, XRT; had peripheral neuropathy after chemo Anemia-NOS Diabetes mellitus, type II Gout Hyperlipidemia Hypertension Renal  insufficiency Osteoarthritis EGD 02-2007-- gastritis GERD Carotid u/s Nl 11-07 (had a carotid bruit) Allergic rhinitis GERD  Past Surgical History: Reviewed history from 12/15/2009 and no changes required. Mastectomy AND ZJQBHALPFX(9024) BONE SCAN-NEG(AUG 2006) open Lung Bx (1999) ----> neg   Social History: Married husband  w/ several medical problems 2 children, 2 gk tobacco-- never  ETOH-- rarely  diet-- relatively low fat , avoiding sugars  exercise-- not exercising lately d/t weather   Review of Systems CV:  Denies chest pain or discomfort and swelling of feet. Resp:  Denies cough and shortness of breath. GI:  Denies bloody stools, nausea, and vomiting. MS:  pain at the distal right foot with walking. Has a long history of tingling/burning in the lower extremity--- that is unchanged. Psych:  admits to some depression, symptoms usually surface in December (Christmas), her sons are divorced, grandkids are not as happy as she would like.  Physical Exam  General:  alert and well-developed.   Neck:  full ROM.  no TTP Lungs:  normal respiratory effort, no intercostal retractions, no accessory muscle use, and normal breath sounds.   Heart:  normal rate, regular rhythm, no murmur, and no gallop.   Pulses:  normal pedal pulses Extremities:  no lower extremity edema inspection and palpation of the right foot normal  Diabetes Management Exam:    Foot Exam (with socks and/or shoes not present):  Sensory-Pinprick/Light touch:          Left medial foot (L-4): normal          Left dorsal foot (L-5): normal          Left lateral foot (S-1): normal          Right medial foot (L-4): normal          Right dorsal foot (L-5): normal          Right lateral foot (S-1): normal       Sensory-Monofilament:          Left foot: normal          Right foot: normal       Inspection:          Left foot: normal          Right foot: normal       Nails:          Left foot: normal           Right foot: normal   Impression & Recommendations:  Problem # 1:  DIABETES MELLITUS, TYPE II (ICD-250.00) labs  Her updated medication list for this problem includes:    Diovan 320 Mg Tabs (Valsartan) .Marland Kitchen... 1 by mouth once daily    Aspir-low 81 Mg Tbec (Aspirin)  Labs Reviewed: Creat: 2.0 (12/15/2009)    Reviewed HgBA1c results: 6.6 (12/15/2009)  6.6 (04/04/2009)  Orders: Venipuncture (53299) TLB-A1C / Hgb A1C (Glycohemoglobin) (83036-A1C) Specimen Handling (99000)  Problem # 2:  HYPERLIPIDEMIA (ICD-272.4) patient  is on amlodipine and simvastatin, although there is no apparent problems, I prefer to switch her to Lipitor 20, half tablet daily Her updated medication list for this problem includes:    Lipitor 20 Mg Tabs (Atorvastatin calcium) .Marland Kitchen... As directed  Labs Reviewed: SGOT: 23 (12/15/2009)   SGPT: 19 (12/15/2009)  Lipid Goals: Chol Goal: 200 (12/15/2009)   HDL Goal: 40 (12/15/2009)   LDL Goal: 100 (12/15/2009)   TG Goal:  will switch to simvastatin to Pravachol.150 (12/15/2009)  Prior 10 Yr Risk Heart Disease: 15 % (12/15/2009)   HDL:55.80 (12/15/2009), 47.0 (11/28/2008)  LDL:77 (11/28/2008), 83 (07/07/2007)  Chol:192 (12/15/2009), 144 (11/28/2008)  Trig:205.0 (12/15/2009), 100 (11/28/2008)  Problem # 3:  HYPERTENSION (ICD-401.9) at goal  Her updated medication list for this problem includes:    Diovan 320 Mg Tabs (Valsartan) .Marland Kitchen... 1 by mouth once daily    Metoprolol Tartrate 50 Mg Tabs (Metoprolol tartrate) .Marland Kitchen... 1 by mouth once daily    Amlodipine Besylate 5 Mg Tabs (Amlodipine besylate) .Marland Kitchen... 1 by mouth once daily    BP today: 126/84 Prior BP: 120/64 (12/15/2009)  Prior 10 Yr Risk Heart Disease: 15 % (12/15/2009)  Labs Reviewed: K+: 4.5 (12/15/2009) Creat: : 2.0 (12/15/2009)   Chol: 192 (12/15/2009)   HDL: 55.80 (12/15/2009)   LDL: 77 (11/28/2008)   TG: 205.0 (12/15/2009)  Orders: TLB-BMP (Basic Metabolic Panel-BMET) (24268-TMHDQQI) Specimen Handling  (99000)  Problem # 4:  OSTEOARTHRITIS (ICD-715.90) neck pain, rec tylenol for now  R foot pain, exam normal, ?Morton's neuroma (pain is different from neuropathy) ----> observe  Her updated medication list for this problem includes:    Aspir-low 81 Mg Tbec (Aspirin)  Problem # 5:  ANEMIA-NOS (ICD-285.9) history of anemia, recheck labs, patient is still reluctant to proceed with a colonoscopy  Hgb: 11.2 (12/15/2009)   Hct: 33.9 (12/15/2009)   Platelets: 182.0 (12/15/2009) RBC: 3.54 (12/15/2009)   RDW: 14.5 (12/15/2009)   WBC: 5.4 (12/15/2009) MCV:  95.7 (12/15/2009)   MCHC: 33.2 (12/15/2009) Iron: 52 (04/04/2009)   % Sat: 18.7 (04/04/2009) B12: 658 (12/15/2009)   Folate: >20.0 ng/mL (12/15/2009)   TSH: 2.49 (12/15/2009)  Orders: TLB-IBC Pnl (Iron/FE;Transferrin) (83550-IBC) TLB-Hemoglobin (Hgb) (85018-HGB) Specimen Handling (99000)  Problem # 6:  depression see ROS, counseled  Complete Medication List: 1)  Diovan 320 Mg Tabs (Valsartan) .Marland Kitchen.. 1 by mouth once daily 2)  Metoprolol Tartrate 50 Mg Tabs (Metoprolol tartrate) .Marland Kitchen.. 1 by mouth once daily 3)  Lipitor 20 Mg Tabs (Atorvastatin calcium) .... As directed 4)  Allopurinol 100 Mg Tabs (Allopurinol) .Marland Kitchen.. 1 by mouth once daily.  due for office visit. 5)  Nexium 40 Mg Cpdr (Esomeprazole magnesium) .Marland Kitchen.. 1 by mouth two times a day before meals 6)  Allegra 60 Mg Tabs (Fexofenadine hcl) .Marland Kitchen.. 1 by mouth qd 7)  Aspir-low 81 Mg Tbec (Aspirin) 8)  Amlodipine Besylate 5 Mg Tabs (Amlodipine besylate) .Marland Kitchen.. 1 by mouth once daily  Patient Instructions: 1)  stop simvastatin 2)  start  Lipitor 20 mg half tablet at bedtime. 3)  for neck pain, try only Tylenol, no Motrin 4)  Please schedule a follow-up appointment in 3 months .  Prescriptions: LIPITOR 20 MG TABS (ATORVASTATIN CALCIUM) as directed  #90 x 1   Entered and Authorized by:   Jacqulyn Bath E. Paz MD   Signed by:   Alda Berthold. Paz MD on 09/09/2010   Method used:   Electronically to        [facility] [phone]* (retail)       [place], [place]  [address]       Ph: [phone]       Fax: [phone]   RxID:   [id number] AMLODIPINE BESYLATE 5 MG TABS (AMLODIPINE BESYLATE) 1 by mouth once daily  #90 Tablet x 1   Entered by:   Allyn Kenner CMA   Authorized by:   Alda Berthold. Paz MD   Signed by:   Alda Berthold. Paz MD on 09/09/2010   Method used:   Electronically to        [facility] [phone]* (retail)       [place], [place]  [address]       Ph: [phone]       Fax: [phone]   RxID:   [id number] ALLOPURINOL 100 MG TABS (ALLOPURINOL) 1 by mouth once daily.  DUE FOR OFFICE VISIT.  #90 x 1   Entered by:   Allyn Kenner CMA   Authorized by:   Alda Berthold. Paz MD   Signed by:   Alda Berthold. Paz MD on 09/09/2010   Method used:   Electronically to        [facility] [phone]* (retail)       [place], [place]  [address]       Ph: [phone]       Fax: [phone]   RxID:   [id number] METOPROLOL TARTRATE 50 MG TABS (METOPROLOL TARTRATE) 1 by mouth once daily  #90 Tablet x 1   Entered by:   Allyn Kenner CMA   Authorized by:   Alda Berthold. Paz MD   Signed by:   Alda Berthold. Paz MD on 09/09/2010  Method used:   Electronically to        [facility] [phone]* (retail)       [address]       [place], [place]  [address]       Ph: [phone]       Fax: [phone]   RxID:   [id number]    Orders Added: 1)  Venipuncture [97949] 2)  TLB-A1C / Hgb A1C (Glycohemoglobin) [83036-A1C] 3)  TLB-BMP (Basic Metabolic Panel-BMET) [97182-UVHAWUJ] 4)  TLB-IBC Pnl (Iron/FE;Transferrin) [83550-IBC] 5)  TLB-Hemoglobin (Hgb) [85018-HGB] 6)  Specimen Handling [99000] 7)  Est. Patient Level IV [34068]   Immunization History:  Influenza Immunization History:    Influenza:  historical  (08/09/2010)   Immunization History:  Influenza Immunization History:    Influenza:  Historical (08/09/2010)

## 2010-11-10 NOTE — Letter (Signed)
Summary: [facility]   Imported By: Edmonia James 11/05/2009 13:49:15  _____________________________________________________________________  External Attachment:    Type:   Image     Comment:   External Document

## 2010-11-10 NOTE — Progress Notes (Signed)
Summary: ALLOPURINOL REFILL--INS REQUIRES 90 DAY SUPPLY  Phone Note Refill Request Message from:  Fax from Pharmacy on July 07, 2010 4:27 PM  Refills Requested: Medication #1:  ALLOPURINOL 100 MG TABS 1 by mouth once daily.  DUE FOR OFFICE VISIT. [facility]X [id number] D[facility] 9/27 [facility]--INSURANCE REQUIRES 90 DAY SUPPLY --PRESCRIPTION FOR 9/24 IS FOR 30 PILLS ONLY BECAUSE [facility] NEEDS OFFICE VISIT     Joylene Igo [phone]  Initial call taken by: Berneta Sages,  July 07, 2010 4:29 PM    Prescriptions: ALLOPURINOL 100 MG TABS (ALLOPURINOL) 1 by mouth once daily.  DUE FOR OFFICE VISIT.  #90 x 0   Entered by:   Allyn Kenner CMA   Authorized by:   Alda Berthold. Paz MD   Signed by:   Allyn Kenner CMA on 07/07/2010   Method used:   Electronically to        [facility] [phone]* (retail)       [address]       [place], [place]  [address]       Ph: [phone]       Fax: [phone]   RxID:   [id number]

## 2010-11-10 NOTE — Assessment & Plan Note (Signed)
Summary: emp-will fast//ccm   Vital Signs:  Patient profile:   75 year old female Height:      62 inches Weight:      145.6 pounds BMI:     26.73 Pulse rate:   70 / minute BP sitting:   120 / 64  Vitals Entered By: Dawson Bills (December 15, 2009 8:54 AM) CC: yearly - fasting - pt has gyn Comments  - pt saw Dr. Moshe Cipro & she increased diovan to 320 BP @ home 144/66, 137/83, 120/59, 123/60, 162/63 Dawson Bills  December 15, 2009 8:59 AM    History of Present Illness: yearly - fasting - pt has gyn chart reviewed  feels well   Renal insuff. pt saw Dr. Moshe Cipro & she increased diovan to 320  HTN BP @ home 144/66, 137/83, 120/59, 123/60, 162/63  h/o breast cancer-- was seen 4-10 by onc: released from  them , needs yearly MMG -breast exam    allergies-- needs a RF   Diabetes-- on diet only, ambulatory CBGs readings? (checks infrecuently)   Hyperlipidemia-- good medication compliance   GERD-- occasionally heartburns depending on diet   Current Medications (verified): 1)  Diovan 320 Mg Tabs (Valsartan) .Marland Kitchen.. 1 By Mouth Once Daily 2)  Metoprolol Tartrate 50 Mg Tabs (Metoprolol Tartrate) .Marland Kitchen.. 1 By Mouth Once Daily 3)  Simvastatin 40 Mg Tabs (Simvastatin) .Marland Kitchen.. 1po Qd 4)  Allopurinol 100 Mg Tabs (Allopurinol) .Marland Kitchen.. 1 By Mouth Qd 5)  Nexium 40 Mg  Cpdr (Esomeprazole Magnesium) .Marland Kitchen.. 1 By Mouth Two Times A Day Before Meals 6)  Allegra 60 Mg  Tabs (Fexofenadine Hcl) .Marland Kitchen.. 1 By Mouth Qd 7)  Flonase 50 Mcg/act  Susp (Fluticasone Propionate) .... 2 Sprays Once Daily 8)  Aspir-Low 81 Mg  Tbec (Aspirin) 9)  Amlodipine Besylate 5 Mg Tabs (Amlodipine Besylate) .Marland Kitchen.. 1 By Mouth Once Daily  Allergies (verified): 1)  ! Sulfa 2)  ! Codeine 3)  ! Percocet 4)  ! Morphine 5)  ! Ultram  Past History:  Past Medical History: h/o breast cancer Anemia-NOS Diabetes mellitus, type II Gout Hyperlipidemia Hypertension Renal insufficiency Osteoarthritis EGD 02-2007-- gastritis  GERD Carotid u/s Nl 11-07 (had a carotid bruit) Allergic rhinitis GERD  Past Surgical History: Mastectomy AND CHYIFOYDXA(1287) BONE SCAN-NEG(AUG 2006) open Lung Bx (1999) ----> neg   Social History: Married husband  w/ several medical problems 2 children tobacco-- never  ETOH-- rarely  diet-- relatively low fat , avoiding sugars  exercise-- not exercising lately d/t weather   Review of Systems General:  Denies fatigue and fever. CV:  Denies chest pain or discomfort and shortness of breath with exertion. Resp:  Denies cough and wheezing. GI:  Denies bloody stools, nausea, and vomiting; no odynophagia or dysphagia . GU:  Denies dysuria and hematuria. Psych:  Denies anxiety and depression.  Physical Exam  General:  alert, well-developed, and well-nourished.   Neck:  no masses, no thyromegaly, and normal carotid upstroke.   Lungs:  normal respiratory effort, no intercostal retractions, no accessory muscle use, and normal breath sounds.   Heart:  normal rate, regular rhythm, no murmur, and no gallop.   Abdomen:  soft, non-tender, no distention, and no masses.   Pulses:  normal B pedal pulses  Extremities:  no pretibial edema bilaterally  Psych:  Oriented X3, memory intact for recent and remote, normally interactive, good eye contact, not anxious appearing, and not depressed appearing.    Diabetes Management Exam:    Foot Exam (with socks  and/or shoes not present):       Sensory-Pinprick/Light touch:          Left medial foot (L-4): normal          Left dorsal foot (L-5): normal          Left lateral foot (S-1): normal          Right medial foot (L-4): normal          Right dorsal foot (L-5): normal          Right lateral foot (S-1): normal       Sensory-Monofilament:          Left foot: normal          Right foot: normal       Inspection:          Left foot: normal          Right foot: normal       Nails:          Left foot: normal          Right foot:  normal   Impression & Recommendations:  Problem # 1:  NEOP, MALIGNANT, FEMALE BREAST NOS (ICD-174.9) released from oncology, needs yearly MMG -breast exam   last MMG 11-10 breast exam @ gyn   Problem # 2:  [place] (ICD-V70.0) chart reviewed  Last Tetanus 2005 Last Pneumovax:  2005 printed material provided regards shingles shot   Female care per gynecology Ubaldo Glassing)  DEXAs per gyn (Dr Ubaldo Glassing rec no further testing per patient)   up to date on colonoscopy ( 2001).  reports hemocults (-)  per gyn  due for Cscope , reluctant to have one done, discussed benefits, patient will let me know when ready     Problem # 3:  ANEMIA-NOS (ICD-285.9) h/o anemia recheck  Hgb: 10.1 (04/04/2009)   Hct: 29.7 (04/04/2009)   Platelets: 161.0 (04/04/2009) RBC: 3.19 (04/04/2009)   RDW: 15.0 (04/04/2009)   WBC: 5.5 (04/04/2009) MCV: 93.2 (04/04/2009)   MCHC: 34.1 (04/04/2009) Iron: 52 (04/04/2009)   % Sat: 18.7 (04/04/2009) B12: 599 (04/04/2009)   Folate: >20.0 ng/mL (04/04/2009)   TSH: 1.87 (07/07/2007)  Orders: TLB-CBC Platelet - w/Differential (85025-CBCD)  Problem # 4:  VITAMIN B12 DEFICIENCY (ICD-266.2) h/o B12 def? on no suplements if labs normal will  take B12 def off  problems list  Orders: TLB-B12 + Folate Pnl (16109_60454-U98/JXB)  Problem # 5:  RENAL INSUFFICIENCY (ICD-588.9) nephrology notes reviewed diovan recently increase by nephrology to improve BP  Orders: TLB-BMP (Basic Metabolic Panel-BMET) (14782-NFAOZHY)  Problem # 6:  HYPERLIPIDEMIA (ICD-272.4) labs  Her updated medication list for this problem includes:    Simvastatin 40 Mg Tabs (Simvastatin) .Marland Kitchen... 1po qd    Labs Reviewed: SGOT: 24 (11/28/2008)   SGPT: 21 (11/28/2008)  Lipid Goals: Chol Goal: 200 (12/15/2009)   HDL Goal: 40 (12/15/2009)   LDL Goal: 100 (12/15/2009)   TG Goal: 150 (12/15/2009)  10 Yr Risk Heart Disease: 15 %   HDL:47.0 (11/28/2008), 42.6 (07/07/2007)  LDL:77 (11/28/2008), 83  (07/07/2007)  Chol:144 (11/28/2008), 166 (07/07/2007)  Trig:100 (11/28/2008), 200 (07/07/2007)  Orders: TLB-ALT (SGPT) (84460-ALT) TLB-AST (SGOT) (84450-SGOT) TLB-Lipid Panel (80061-LIPID)  Problem # 7:  HYPERTENSION (ICD-401.9) ambulatory BPs varies, diovan dose  recently increased  no change for now  Her updated medication list for this problem includes:    Diovan 320 Mg Tabs (Valsartan) .Marland Kitchen... 1 by mouth once daily    Metoprolol Tartrate 50 Mg Tabs (Metoprolol tartrate) .Marland KitchenMarland KitchenMarland KitchenMarland Kitchen  1 by mouth once daily    Amlodipine Besylate 5 Mg Tabs (Amlodipine besylate) .Marland Kitchen... 1 by mouth once daily    BP today: 120/64 Prior BP: 104/46 (04/04/2009)  10 Yr Risk Heart Disease: 15 %  Labs Reviewed: K+: 4.8 (11/28/2008) Creat: : 2.1 (11/28/2008)   Chol: 144 (11/28/2008)   HDL: 47.0 (11/28/2008)   LDL: 77 (11/28/2008)   TG: 100 (11/28/2008)  Orders: TLB-TSH (Thyroid Stimulating Hormone) (84443-TSH)  Problem # 8:  GOUT (ICD-274.9) labs Her updated medication list for this problem includes:    Allopurinol 100 Mg Tabs (Allopurinol) .Marland Kitchen... 1 by mouth qd      Orders: TLB-Uric Acid, Blood (84550-URIC)  Problem # 9:  DIABETES MELLITUS, TYPE II (ICD-250.00) on diet only, labs  Her updated medication list for this problem includes:    Diovan 320 Mg Tabs (Valsartan) .Marland Kitchen... 1 by mouth once daily    Aspir-low 81 Mg Tbec (Aspirin)    Labs Reviewed: Creat: 2.1 (11/28/2008)    Reviewed HgBA1c results: 6.6 (04/04/2009)  6.5 (11/28/2008)  Orders: TLB-A1C / Hgb A1C (Glycohemoglobin) (83036-A1C) Venipuncture (76720)  Problem # 10:  needs labs sent to renal   Complete Medication List: 1)  Diovan 320 Mg Tabs (Valsartan) .Marland Kitchen.. 1 by mouth once daily 2)  Metoprolol Tartrate 50 Mg Tabs (Metoprolol tartrate) .Marland Kitchen.. 1 by mouth once daily 3)  Simvastatin 40 Mg Tabs (Simvastatin) .Marland Kitchen.. 1po qd 4)  Allopurinol 100 Mg Tabs (Allopurinol) .Marland Kitchen.. 1 by mouth qd 5)  Nexium 40 Mg Cpdr (Esomeprazole magnesium) .Marland Kitchen.. 1 by  mouth two times a day before meals 6)  Allegra 60 Mg Tabs (Fexofenadine hcl) .Marland Kitchen.. 1 by mouth qd 7)  Flonase 50 Mcg/act Susp (Fluticasone propionate) .... 2 sprays once daily 8)  Aspir-low 81 Mg Tbec (Aspirin) 9)  Amlodipine Besylate 5 Mg Tabs (Amlodipine besylate) .Marland Kitchen.. 1 by mouth once daily  Other Orders: Pneumococcal Vaccine (94709) Admin 1st Vaccine (62836)   Patient Instructions: 1)  Please schedule a follow-up appointment in 4 months .  Prescriptions: ALLEGRA 60 MG  TABS (FEXOFENADINE HCL) 1 by mouth qd  #90 Tablet x 1   Entered and Authorized by:   Alda Berthold. Christina Gintz MD   Signed by:   Alda Berthold. Maryum Batterson MD on 12/15/2009   Method used:   Electronically to        [facility] [phone]* (retail)       [address]       [place], [place]  [address]       Ph: [phone]       Fax: [phone]   RxID:   [id number]    Preventive Care Screening  Mammogram:    Date:  08/13/2009    Results:  normal  Prior Values:    Pap Smear:  normal (06/12/2007)    Mammogram:  ASSESSMENT: Negative - BI-RADS 1^MM DIGITAL SCREENING UNILAT R (08/13/2009)    Bone Density:  normal (10/11/2005)    Last Tetanus Booster:  Historical (05/28/2004)    Last Pneumovax:  Historical (10/12/2003)    Dexa Interp:  normal (10/11/2005)     Immunizations Administered:  Pneumonia Vaccine:    Vaccine Type: Pneumovax (Medicare)    Site: right deltoid    Mfr: Merck    Dose: 0.5 ml    Route: IM    Given by: Dawson Bills    Exp. Date: 01/29/2011    Lot #: [id number]

## 2010-11-10 NOTE — Progress Notes (Signed)
Summary: appt  Phone Note Outgoing Call   Call placed by: Allyn Kenner CMA,  September 08, 2010 8:05 AM Summary of Call: Pt needs f/u appt with Dr.Paz.  Follow-up for Phone Call        Patient is coming in tomorrow morning @ 8:20am for a fasting appt.Marland KitchenMarland KitchenElna Mckenzie  September 08, 2010 9:06 AM

## 2010-11-10 NOTE — Progress Notes (Signed)
Summary: Refill Request  Phone Note Refill Request Call back at [phone] Message from:  Pharmacy on Feb 27, 2010 4:29 PM  Refills Requested: Medication #1:  SIMVASTATIN 40 MG TABS 1po qd   Dosage confirmed as above?Dosage Confirmed   [facility] [address]  Next Appointment Scheduled: none Initial call taken by: Elna Breslow,  Feb 27, 2010 4:29 PM    Prescriptions: SIMVASTATIN 40 MG TABS (SIMVASTATIN) 1po qd  #30 Tablet x 6   Entered by:   Dawson Bills   Authorized by:   Alda Berthold. Paz MD   Signed by:   Dawson Bills on 02/27/2010   Method used:   Electronically to        [facility] [phone]* (retail)       [address]       [place], [place]  [address]       Ph: [phone]       Fax: [phone]   RxID:   [id number]

## 2010-11-10 NOTE — Letter (Signed)
Summary: Rexford Maus MD  Rexford Maus MD   Imported By: Edmonia James 08/13/2010 15:28:54  _____________________________________________________________________  External Attachment:    Type:   Image     Comment:   External Document

## 2010-11-10 NOTE — Progress Notes (Signed)
Summary: Refill Requests  Phone Note Refill Request Message from:  [facility] Fax #: [phone]  Refills Requested: Medication #1:  ALLOPURINOL 100 MG TABS 1 by mouth qd   Dosage confirmed as above?Dosage Confirmed   Supply Requested: 3 months  Medication #2:  AMLODIPINE BESYLATE 5 MG TABS 1 by mouth once daily.   Dosage confirmed as above?Dosage Confirmed   Supply Requested: 3 months  Medication #3:  METOPROLOL TARTRATE 50 MG TABS 1 by mouth once daily   Dosage confirmed as above?Dosage Confirmed   Supply Requested: 3 months Next Appointment Scheduled: none Initial call taken by: Elna Breslow,  January 29, 2010 8:28 AM    Prescriptions: AMLODIPINE BESYLATE 5 MG TABS (AMLODIPINE BESYLATE) 1 by mouth once daily  #30 Tablet x 3   Entered by:   Verdie Mosher   Authorized by:   Alda Berthold. Paz MD   Signed by:   Verdie Mosher on 01/29/2010   Method used:   [facility]ed to ...       [facility] [phone]* (retail)       [place], [place]  [address]       Ph: [phone]       Fax: [phone]   RxID:   [id number] ALLOPURINOL 100 MG TABS (ALLOPURINOL) 1 by mouth qd  #30 Tablet x 3   Entered by:   Verdie Mosher   Authorized by:   Alda Berthold. Paz MD   Signed by:   Verdie Mosher on 01/29/2010   Method used:   Faxed to ...       [facility] [phone]* (retail)       [place], [place]  [address]       Ph: [phone]       Fax: [phone]   RxID:   [id number] METOPROLOL TARTRATE 50 MG TABS (METOPROLOL TARTRATE) 1 by mouth once daily  #30 Tablet x 3   Entered by:   Verdie Mosher   Authorized by:   Alda Berthold. Paz MD   Signed by:   Verdie Mosher on 01/29/2010   Method used:   Faxed to ...       [facility] [phone]* (retail)       [address]       [place], [place]  [address]       Ph: [phone]       Fax: [phone]   RxID:    [id number]

## 2010-11-10 NOTE — Progress Notes (Signed)
Summary: allopurinol refill   Phone Note Refill Request Call back at [phone] Message from:  Pharmacy on April 06, 2010 9:47 AM  Refills Requested: Medication #1:  ALLOPURINOL 100 MG TABS 1 by mouth qd   Dosage confirmed as above?Dosage Confirmed   Supply Requested: 3 months [facility] [address] NEEDS 90 DAY SUPPLY FOR INSURANCE PURPOSES.  Next Appointment Scheduled: NONE Initial call taken by: Osborn Coho,  April 06, 2010 9:48 AM    Prescriptions: ALLOPURINOL 100 MG TABS (ALLOPURINOL) 1 by mouth qd  #90 x 0   Entered by:   Malachi Bonds   Authorized by:   Alda Berthold. Paz MD   Signed by:   Malachi Bonds on 04/06/2010   Method used:   Electronically to        [facility] [phone]* (retail)       [address]       [place], [place]  [address]       Ph: [phone]       Fax: [phone]   RxID:   [id number]

## 2010-11-12 NOTE — Assessment & Plan Note (Signed)
Summary: ears clogged//lch   Vital Signs:  Patient profile:   75 year old female Height:      62 inches Weight:      145.25 pounds BMI:     26.66 Temp:     98.1 degrees F oral Pulse rate:   76 / minute Pulse rhythm:   regular BP sitting:   128 / 82  (left arm) Cuff size:   regular  Vitals Entered By: [place] (October 26, 2010 2:18 PM) CC: Pt here c/o ears feeling "full", head feels like its going to explode. Comments x 1 week not fasting [facility] [address]   History of Present Illness: clogged feeling at ears x 1 week  ROS no ear d/c or pain no URI type of symptoms  hearing ?sometimes decreased    Current Medications (verified): 1)  Diovan 320 Mg Tabs (Valsartan) .Marland Kitchen.. 1 By Mouth Once Daily 2)  Metoprolol Tartrate 50 Mg Tabs (Metoprolol Tartrate) .Marland Kitchen.. 1 By Mouth Once Daily 3)  Lipitor 20 Mg Tabs (Atorvastatin Calcium) .... As Directed 4)  Allopurinol 100 Mg Tabs (Allopurinol) .Marland Kitchen.. 1 By Mouth Once Daily.  Due For Office Visit. 5)  Nexium 40 Mg  Cpdr (Esomeprazole Magnesium) .Marland Kitchen.. 1 By Mouth Two Times A Day Before Meals 6)  Allegra 60 Mg  Tabs (Fexofenadine Hcl) .Marland Kitchen.. 1 By Mouth Qd 7)  Aspir-Low 81 Mg  Tbec (Aspirin) 8)  Amlodipine Besylate 5 Mg Tabs (Amlodipine Besylate) .Marland Kitchen.. 1 By Mouth Once Daily  Allergies (verified): 1)  ! Sulfa 2)  ! Codeine 3)  ! Percocet 4)  ! Morphine 5)  ! Ultram  Past History:  Past Medical History: Reviewed history from 09/09/2010 and no changes required. h/o breast cancer--- surgery,chemo, XRT; had peripheral neuropathy after chemo Anemia-NOS Diabetes mellitus, type II Gout Hyperlipidemia Hypertension Renal insufficiency Osteoarthritis EGD 02-2007-- gastritis GERD Carotid u/s Nl 11-07 (had a carotid bruit) Allergic rhinitis GERD  Past Surgical History: Reviewed history from 12/15/2009 and no changes required. Mastectomy AND JQBHALPFXT(0240) BONE SCAN-NEG(AUG 2006) open Lung Bx (1999) ----> neg   Physical  Exam  General:  alert and well-developed.   Head:  face symetric , NTTP Ears:  R ear normal and L ear --wax Nose:  not congested    Impression & Recommendations:  Problem # 1:  CERUMEN IMPACTION (ICD-380.4) Assessment New  cerumen impact., better after lavage (L side) will use peroxide and will call if no better   Orders: Cerumen Impaction Removal (97353)  Complete Medication List: 1)  Diovan 320 Mg Tabs (Valsartan) .Marland Kitchen.. 1 by mouth once daily 2)  Metoprolol Tartrate 50 Mg Tabs (Metoprolol tartrate) .Marland Kitchen.. 1 by mouth once daily 3)  Lipitor 20 Mg Tabs (Atorvastatin calcium) .... As directed 4)  Allopurinol 100 Mg Tabs (Allopurinol) .Marland Kitchen.. 1 by mouth once daily.  due for office visit. 5)  Nexium 40 Mg Cpdr (Esomeprazole magnesium) .Marland Kitchen.. 1 by mouth two times a day before meals 6)  Allegra 60 Mg Tabs (Fexofenadine hcl) .Marland Kitchen.. 1 by mouth qd 7)  Aspir-low 81 Mg Tbec (Aspirin) 8)  Amlodipine Besylate 5 Mg Tabs (Amlodipine besylate) .Marland Kitchen.. 1 by mouth once daily  Patient Instructions: 1)  peroxide : 3 or 4 drops in the L ear once a day x 5 days    Orders Added: 1)  Est. Patient Level II [29924] 2)  Cerumen Impaction Removal [26834]

## 2010-12-28 ENCOUNTER — Encounter: Payer: Self-pay | Admitting: Internal Medicine

## 2010-12-28 ENCOUNTER — Ambulatory Visit (INDEPENDENT_AMBULATORY_CARE_PROVIDER_SITE_OTHER): Payer: Medicare Other | Admitting: Internal Medicine

## 2010-12-28 ENCOUNTER — Other Ambulatory Visit: Payer: Self-pay | Admitting: Internal Medicine

## 2010-12-28 DIAGNOSIS — E119 Type 2 diabetes mellitus without complications: Secondary | ICD-10-CM

## 2010-12-28 DIAGNOSIS — E785 Hyperlipidemia, unspecified: Secondary | ICD-10-CM

## 2010-12-28 DIAGNOSIS — M542 Cervicalgia: Secondary | ICD-10-CM | POA: Insufficient documentation

## 2010-12-28 DIAGNOSIS — R5383 Other fatigue: Secondary | ICD-10-CM | POA: Insufficient documentation

## 2010-12-28 DIAGNOSIS — I10 Essential (primary) hypertension: Secondary | ICD-10-CM

## 2010-12-28 DIAGNOSIS — Z79899 Other long term (current) drug therapy: Secondary | ICD-10-CM

## 2010-12-28 DIAGNOSIS — R5381 Other malaise: Secondary | ICD-10-CM

## 2010-12-29 LAB — HEPATIC FUNCTION PANEL
ALT: 17 U/L (ref 0–35)
AST: 22 U/L (ref 0–37)
Albumin: 3.9 g/dL (ref 3.5–5.2)
Alkaline Phosphatase: 65 U/L (ref 39–117)
Bilirubin, Direct: 0.1 mg/dL (ref 0.0–0.3)
Total Bilirubin: 0.4 mg/dL (ref 0.3–1.2)
Total Protein: 6.8 g/dL (ref 6.0–8.3)

## 2010-12-29 LAB — BASIC METABOLIC PANEL
BUN: 39 mg/dL — ABNORMAL HIGH (ref 6–23)
CO2: 28 mEq/L (ref 19–32)
Calcium: 9.4 mg/dL (ref 8.4–10.5)
Chloride: 107 mEq/L (ref 96–112)
Creatinine, Ser: 2.2 mg/dL — ABNORMAL HIGH (ref 0.4–1.2)
GFR: 23.42 mL/min — ABNORMAL LOW (ref >60.00)
Glucose, Bld: 128 mg/dL — ABNORMAL HIGH (ref 70–99)
Potassium: 4.7 mEq/L (ref 3.5–5.1)
Sodium: 144 mEq/L (ref 135–145)

## 2010-12-29 LAB — CBC WITH DIFFERENTIAL/PLATELET
Basophils Absolute: 0 10*3/uL (ref 0.0–0.1)
Basophils Relative: 0.3 % (ref 0.0–3.0)
Eosinophils Absolute: 0.2 10*3/uL (ref 0.0–0.7)
Eosinophils Relative: 2.6 % (ref 0.0–5.0)
HCT: 34.2 % — ABNORMAL LOW (ref 36.0–46.0)
Hemoglobin: 11.5 g/dL — ABNORMAL LOW (ref 12.0–15.0)
Lymphocytes Relative: 41.6 % (ref 12.0–46.0)
Lymphs Abs: 2.7 10*3/uL (ref 0.7–4.0)
MCHC: 33.6 g/dL (ref 30.0–36.0)
MCV: 95.7 fl (ref 78.0–100.0)
Monocytes Absolute: 0.4 10*3/uL (ref 0.1–1.0)
Monocytes Relative: 5.6 % (ref 3.0–12.0)
Neutro Abs: 3.2 10*3/uL (ref 1.4–7.7)
Neutrophils Relative %: 49.9 % (ref 43.0–77.0)
Platelets: 195 10*3/uL (ref 150.0–400.0)
RBC: 3.58 Mil/uL — ABNORMAL LOW (ref 3.87–5.11)
RDW: 15.4 % — ABNORMAL HIGH (ref 11.5–14.6)
WBC: 6.5 10*3/uL (ref 4.5–10.5)

## 2010-12-29 LAB — HEMOGLOBIN A1C: Hgb A1c MFr Bld: 6.8 % — ABNORMAL HIGH (ref 4.6–6.5)

## 2010-12-29 LAB — TSH: TSH: 1.56 u[IU]/mL (ref 0.35–5.50)

## 2010-12-29 LAB — SEDIMENTATION RATE: Sed Rate: 79 mm/hr — ABNORMAL HIGH (ref 0–22)

## 2010-12-29 LAB — B12 AND FOLATE PANEL
Folate: >24.8 ng/mL (ref >5.9)
Vitamin B-12: 1255 pg/mL — ABNORMAL HIGH (ref 211–911)

## 2011-01-01 MED ORDER — DIAZEPAM 2 MG PO TABS: 2.0000 mg | ORAL_TABLET | Freq: Every evening | ORAL | Status: DC | PRN

## 2011-01-01 NOTE — Progress Notes (Addendum)
Addended by: Allyn Kenner on: 01/01/2011 09:06 AM  Modules accepted: Orders  Pt will stop Flexeril

## 2011-01-07 NOTE — Assessment & Plan Note (Signed)
Summary: fatigue/headache/cbs   Vital Signs:  Patient profile:   75 year old female Weight:      146.50 pounds Pulse rate:   65 / minute Pulse rhythm:   regular BP sitting:   144 / 80  (left arm) Cuff size:   regular  Vitals Entered By: Allyn Kenner CMA (December 28, 2010 2:17 PM) CC: Pt here to disucss being fatigued and a chronic Ha/ Comments x [facility]    History of Present Illness:  6 months history of fatigue, described as "I need to rest more frequently"  symptoms are gradually slightly worse  denies feeling sleepy per se.  Also several months history of "headache"  The pain is located at the nuchal area  and radiates down to the shoulders and trapezoid areas bilaterally. Sometimes worse with certain head motion.  described as a "tenderness and soreness in the scalp" Not the  worst headache of her life.   Diabetes--  no recent ambulatory CBGs   Hyperlipidemia-- was switched from Lipitor a few months ago, above describe headache was already going on when she started Lipitor.    ROS  no fever or weight loss  mood okay, very mild and sporadic depressive and anxiety feelings No chest pain, lower extremity edema, dyspnea on exertion  has been told she snores but otherwise  she sleeps well  Current Medications (verified): 1)  Diovan 320 Mg Tabs (Valsartan) .Marland Kitchen.. 1 By Mouth Once Daily 2)  Metoprolol Tartrate 50 Mg Tabs (Metoprolol Tartrate) .Marland Kitchen.. 1 By Mouth Once Daily 3)  Lipitor 20 Mg Tabs (Atorvastatin Calcium) .... As Directed 4)  Allopurinol 100 Mg Tabs (Allopurinol) .Marland Kitchen.. 1 By Mouth Once Daily.  Due For Office Visit. 5)  Nexium 40 Mg  Cpdr (Esomeprazole Magnesium) .Marland Kitchen.. 1 By Mouth Two Times A Day Before Meals 6)  Allegra 60 Mg  Tabs (Fexofenadine Hcl) .Marland Kitchen.. 1 By Mouth Qd 7)  Aspir-Low 81 Mg  Tbec (Aspirin) 8)  Amlodipine Besylate 5 Mg Tabs (Amlodipine Besylate) .Marland Kitchen.. 1 By Mouth Once Daily  Allergies (verified): 1)  ! Sulfa 2)  ! Codeine 3)  ! Percocet 4)   ! Morphine 5)  ! Ultram  Past History:  Past Medical History: h/o breast cancer--- surgery,chemo, XRT; had peripheral neuropathy (imbalance at times) after chemo Anemia-NOS Diabetes mellitus, type II Gout Hyperlipidemia Hypertension Renal insufficiency Osteoarthritis EGD 02-2007-- gastritis GERD Carotid u/s Nl 11-07 (had a carotid bruit) Allergic rhinitis GERD  Past Surgical History: Reviewed history from 12/15/2009 and no changes required. Mastectomy AND QTMAUQJFHL(4562) BONE SCAN-NEG(AUG 2006) open Lung Bx (1999) ----> neg   Social History: Reviewed history from 09/09/2010 and no changes required. Married husband  w/ several medical problems 2 children, 2 gk tobacco-- never  ETOH-- rarely  diet-- relatively low fat , avoiding sugars  exercise-- not exercising lately d/t weather   Physical Exam  General:  alert and well-developed.   no apparent distress, seems to be doing well Neck:   no JVD at 45  cervical spine is nontender to palpation, range of motion is normal, the pain was elicited when she turned to her sides. Lungs:  normal respiratory effort, no intercostal retractions, no accessory muscle use, and normal breath sounds.   Heart:  normal rate, regular rhythm, no murmur, and no gallop.   Extremities:  no lower extremity edema   Neurologic:  alert & oriented X3 and strength normal in all extremities.   DTRs symmetrically decreased throughout Psych:  Cognition and judgment  appear intact. Alert and cooperative with normal attention span and concentration. not anxious appearing and not depressed appearing.     Impression & Recommendations:  Problem # 1:  FATIGUE (ICD-780.79) several months history of fatigue, cardiac review of systems negative, she is not volume overloaded. Very mild anxiety /depression. Plan: Labs  consider further workup if symptoms continue  Orders: TLB-CBC Platelet - w/Differential (85025-CBCD) TLB-B12 + Folate Pnl  (82746_82607-B12/FOL) TLB-Sedimentation Rate (ESR) (85652-ESR) TLB-TSH (Thyroid Stimulating Hormone) (84443-TSH)  Problem # 2:  NECK PAIN (ICD-723.1)  what she describes as  "headache"  I think is actually neck pain, see physical exam.  recommend Flexeril at bedtime , consider XRays Her updated medication list for this problem includes:    Aspir-low 81 Mg Tbec (Aspirin)    Cyclobenzaprine Hcl 5 Mg Tabs (Cyclobenzaprine hcl) ..... One at bedtime as needed  Problem # 3:  DIABETES MELLITUS, TYPE II (ICD-250.00) labs  Her updated medication list for this problem includes:    Diovan 320 Mg Tabs (Valsartan) .Marland Kitchen... 1 by mouth once daily    Aspir-low 81 Mg Tbec (Aspirin)  Orders: TLB-A1C / Hgb A1C (Glycohemoglobin) (83036-A1C)  Labs Reviewed: Creat: 2.3 (09/09/2010)    Reviewed HgBA1c results: 6.7 (09/09/2010)  6.6 (12/15/2009)  Problem # 4:  HYPERLIPIDEMIA (ICD-272.4) labs  Her updated medication list for this problem includes:    Lipitor 20 Mg Tabs (Atorvastatin calcium) .Marland Kitchen... As directed  Orders: Venipuncture (16109) TLB-Hepatic/Liver Function Pnl (80076-HEPATIC)  Labs Reviewed: SGOT: 23 (12/15/2009)   SGPT: 19 (12/15/2009)  Lipid Goals: Chol Goal: 200 (12/15/2009)   HDL Goal: 40 (12/15/2009)   LDL Goal: 100 (12/15/2009)   TG Goal: 150 (12/15/2009)  Prior 10 Yr Risk Heart Disease: 15 % (12/15/2009)   HDL:55.80 (12/15/2009), 47.0 (11/28/2008)  LDL:77 (11/28/2008), 83 (07/07/2007)  Chol:192 (12/15/2009), 144 (11/28/2008)  Trig:205.0 (12/15/2009), 100 (11/28/2008)  Complete Medication List: 1)  Diovan 320 Mg Tabs (Valsartan) .Marland Kitchen.. 1 by mouth once daily 2)  Metoprolol Tartrate 50 Mg Tabs (Metoprolol tartrate) .Marland Kitchen.. 1 by mouth once daily 3)  Lipitor 20 Mg Tabs (Atorvastatin calcium) .... As directed 4)  Allopurinol 100 Mg Tabs (Allopurinol) .Marland Kitchen.. 1 by mouth once daily.  due for office visit. 5)  Nexium 40 Mg Cpdr (Esomeprazole magnesium) .Marland Kitchen.. 1 by mouth two times a day before  meals 6)  Allegra 60 Mg Tabs (Fexofenadine hcl) .Marland Kitchen.. 1 by mouth qd 7)  Aspir-low 81 Mg Tbec (Aspirin) 8)  Amlodipine Besylate 5 Mg Tabs (Amlodipine besylate) .Marland Kitchen.. 1 by mouth once daily 9)  Cyclobenzaprine Hcl 5 Mg Tabs (Cyclobenzaprine hcl) .... One at bedtime as needed  Other Orders: TLB-BMP (Basic Metabolic Panel-BMET) (60454-UJWJXBJ)  Patient Instructions: 1)   warm compresses to the neck twice a day  2)  cyclobenzaprine at bedtime for neck pain 3)  Watch for drowsiness 4)  Come back in 2 months for a physical exam, fasting Prescriptions: CYCLOBENZAPRINE HCL 5 MG TABS (CYCLOBENZAPRINE HCL) one at bedtime as needed  #30 x 1   Entered and Authorized by:   Malia Corsi E. Rillie Riffel MD   Signed by:   Alda Berthold. Amahd Morino MD on 12/28/2010   Method used:   Print then Give to Patient   RxID:   [id number]    Orders Added: 1)  Venipuncture [36415] 2)  TLB-A1C / Hgb A1C (Glycohemoglobin) [83036-A1C] 3)  TLB-BMP (Basic Metabolic Panel-BMET) [46962-XBMWUXL] 4)  TLB-CBC Platelet - w/Differential [85025-CBCD] 5)  TLB-B12 + Folate Pnl [82746_82607-B12/FOL] 6)  TLB-Sedimentation Rate (ESR) [85652-ESR] 7)  TLB-Hepatic/Liver Function Pnl [80076-HEPATIC] 8)  TLB-TSH (Thyroid Stimulating Hormone) [84443-TSH] 9)  Est. Patient Level IV [70786]

## 2011-01-19 LAB — COMPREHENSIVE METABOLIC PANEL WITH GFR
ALT: 22 U/L (ref 0–35)
AST: 26 U/L (ref 0–37)
Albumin: 3.4 g/dL — ABNORMAL LOW (ref 3.5–5.2)
Alkaline Phosphatase: 61 U/L (ref 39–117)
BUN: 38 mg/dL — ABNORMAL HIGH (ref 6–23)
CO2: 22 meq/L (ref 19–32)
Calcium: 8.5 mg/dL (ref 8.4–10.5)
Chloride: 109 meq/L (ref 96–112)
Creatinine, Ser: 2.06 mg/dL — ABNORMAL HIGH (ref 0.4–1.2)
GFR calc non Af Amer: 24 mL/min — ABNORMAL LOW
Glucose, Bld: 193 mg/dL — ABNORMAL HIGH (ref 70–99)
Potassium: 4.2 meq/L (ref 3.5–5.1)
Sodium: 137 meq/L (ref 135–145)
Total Bilirubin: 0.5 mg/dL (ref 0.3–1.2)
Total Protein: 6.9 g/dL (ref 6.0–8.3)

## 2011-01-19 LAB — URINE CULTURE

## 2011-01-19 LAB — CBC
HCT: 26.8 % — ABNORMAL LOW (ref 36.0–46.0)
Hemoglobin: 9 g/dL — ABNORMAL LOW (ref 12.0–15.0)
MCHC: 33.8 g/dL (ref 30.0–36.0)
MCV: 93.6 fL (ref 78.0–100.0)
Platelets: 134 10*3/uL — ABNORMAL LOW (ref 150–400)
RBC: 2.86 MIL/uL — ABNORMAL LOW (ref 3.87–5.11)
RDW: 15.9 % — ABNORMAL HIGH (ref 11.5–15.5)
WBC: 7.5 10*3/uL (ref 4.0–10.5)

## 2011-01-19 LAB — DIFFERENTIAL
Basophils Absolute: 0 10*3/uL (ref 0.0–0.1)
Lymphocytes Relative: 10 % — ABNORMAL LOW (ref 12–46)
Monocytes Absolute: 0.2 10*3/uL (ref 0.1–1.0)
Monocytes Relative: 3 % (ref 3–12)
Neutro Abs: 6.6 10*3/uL (ref 1.7–7.7)

## 2011-01-19 LAB — URINE MICROSCOPIC-ADD ON

## 2011-01-19 LAB — PROTIME-INR
INR: 1.2 (ref 0.00–1.49)
Prothrombin Time: 15.9 seconds — ABNORMAL HIGH (ref 11.6–15.2)

## 2011-01-19 LAB — TYPE AND SCREEN

## 2011-01-19 LAB — COMPREHENSIVE METABOLIC PANEL
ALT: 25 U/L (ref 0–35)
AST: 34 U/L (ref 0–37)
Calcium: 8.1 mg/dL — ABNORMAL LOW (ref 8.4–10.5)
Creatinine, Ser: 2.07 mg/dL — ABNORMAL HIGH (ref 0.4–1.2)
GFR calc Af Amer: 29 mL/min — ABNORMAL LOW (ref >60)
GFR calc non Af Amer: 24 mL/min — ABNORMAL LOW (ref >60)
Sodium: 142 mEq/L (ref 135–145)
Total Protein: 6.1 g/dL (ref 6.0–8.3)

## 2011-01-19 LAB — HEMOGLOBIN AND HEMATOCRIT, BLOOD
HCT: 31.7 % — ABNORMAL LOW (ref 36.0–46.0)
Hemoglobin: 10 g/dL — ABNORMAL LOW (ref 12.0–15.0)
Hemoglobin: 10.4 g/dL — ABNORMAL LOW (ref 12.0–15.0)
Hemoglobin: 9.5 g/dL — ABNORMAL LOW (ref 12.0–15.0)

## 2011-01-19 LAB — GLUCOSE, CAPILLARY: Glucose-Capillary: 190 mg/dL — ABNORMAL HIGH (ref 70–99)

## 2011-01-19 LAB — ABO/RH: ABO/RH(D): A POS

## 2011-01-19 LAB — URINALYSIS, ROUTINE W REFLEX MICROSCOPIC
Hgb urine dipstick: NEGATIVE
Nitrite: NEGATIVE
Protein, ur: NEGATIVE mg/dL
Specific Gravity, Urine: 1.009 (ref 1.005–1.030)
Urobilinogen, UA: 0.2 mg/dL (ref 0.0–1.0)

## 2011-01-19 LAB — HEMOCCULT GUIAC POC 1CARD (OFFICE): Fecal Occult Bld: POSITIVE

## 2011-02-12 ENCOUNTER — Encounter: Payer: Medicare Other | Admitting: Internal Medicine

## 2011-02-23 NOTE — Assessment & Plan Note (Signed)
[facility] OFFICE NOTE   Samantha, Mckenzie                      MRN:          [id number]  DATE:03/16/2007                            DOB:          [birth year]    HISTORY:  Samantha Mckenzie presents today for followup.  She is accompanied by  her husband.  She was evaluated January 19, 2007 for reflux.  See that  dictation for details.  She underwent upper endoscopy Feb 15, 2007.  She  was found to have reflux esophagitis.  An inflammatory-appearing nodule  at the gastroesophageal junction was biopsied and returned inflammation  only.  There was no Barrett's esophagus.  She also had a small hiatal  hernia and nonspecific gastritis.  Testing for Helicobacter pylori was  negative.  She was placed on Prilosec 20 mg daily and asked to follow up  at this time.  On Prilosec, her symptoms are improved.  However, she  does experience intermittent breakthrough and is requesting a change  from Prilosec to Nexium.  No dysphagia, nausea or vomiting, abdominal  pain, or other symptoms.   CURRENT MEDICATIONS:  Include allopurinol.  Arimidex.  Diovan.  Doxazosin.  Metoprolol.  Zocor.  Prilosec.   PHYSICAL EXAM:  Well-appearing female in no acute distress.  Blood pressure is 152/62, heart rate 72 and regular, weight 157.6  pounds.  The abdomen was not re-examined.   IMPRESSION:  1. Gastroesophageal reflux disease with endoscopic evidence of      esophagitis.  Improved though incompletely on Prilosec.  2. Gastritis.  No evidence of Helicobacter pylori.   RECOMMENDATIONS:  1. Reflux precautions with attention to weight loss.  2. Change from Prilosec to Nexium 40 mg daily.  Samples as well as a      prescription with multiple refills have been provided.  3. GI followup in 1 year.  4. Resume general medical care with Dr. Larose Kells.     Docia Chuck. Henrene Pastor, MD  Electronically Signed    JNP/MedQ  DD: 03/16/2007  DT: 03/16/2007  Job #:  [id number]   cc:   Kathlene November, MD

## 2011-02-23 NOTE — Discharge Summary (Signed)
NAME:  Samantha Mckenzie, Samantha Mckenzie               ACCOUNT NO.:  [id number]   MEDICAL RECORD NO.:  [id number]          PATIENT TYPE:  INP   LOCATION:  24                         FACILITY:  [facility]   PHYSICIAN:  Darrick Penna. Swords, MD    DATE OF BIRTH:  [birth year]   DATE OF ADMISSION:  03/06/2009  DATE OF DISCHARGE:  03/07/2009                               DISCHARGE SUMMARY   DISCHARGE DIAGNOSES:  1. Acute gastroenteritis.  2. Heme-positive stool.  3. Presyncope.   Other diagnoses per HPI.   DISCHARGE MEDICATIONS:  Per medicine reconciliation form.   DISCHARGE LABORATORY DATA:  CMET normal except for a chloride of 114,  glucose 114, BUN 28, creatinine 2.07, albumin 2.9, calcium 8.1.  Discharge hemoglobin 9.5 (presenting hemoglobin 9.0).  Hemoccult  positive stool.   HOSPITAL PROCEDURES:  Abdominal x-ray with no acute abnormalities.   HOSPITAL CONSULT:  Gastroenterology, no recommendations for evaluation  of GI bleed.   HOSPITAL COURSE:  The patient admitted to the hospitalist service on Mar 06, 2009; see admission note for details.  The patient admitted with  acute gastroenteritis-type symptoms but a hemoglobin of 9.0 and heme-  positive stools.  The patient admitted to the hospital for evaluation of  GI bleed.  Hemoglobin actually increased during the hospitalization with  hydration.  Based on that, gastroenterology thought heme-positive stools  resulting from mucosal irritation.  No further evaluation recommended at  this time.   CONDITION ON DISCHARGE:  Improved; the patient tolerating diet without  difficulty.  No nausea, vomiting.  No gross GI bleed.   FOLLOW-UP PLANS:  Dr. Larose Kells as previously scheduled.      Bruce Lemmie Evens Swords, MD  Electronically Signed     BHS/MEDQ  D:  03/07/2009  T:  03/07/2009  Job:  [id number]

## 2011-02-26 NOTE — Discharge Summary (Signed)
[place]  Patient:    Samantha Mckenzie, Samantha Mckenzie                       MRN: [id number] Adm. Date:  [id number] Disc. Date: [id number] Attending:  Linna Darner Dictator:   Rejeana Brock. Vanessa Wardell, M.D. [facility] CC:         Darrick Penna. Swords, M.D. [facility]                           Discharge Summary  HISTORY OF PRESENT ILLNESS:  Samantha Mckenzie is a 75 year old [place] female admitted by Dr. Linna Darner due to chills and rash of the left upper extremity. Apparently, the fever began early in the morning of 04/15/00.  The patient ten presented with erythema of the left upper extremity with associated cellulitis.  The patient is a primary care patient of Dr. Phoebe Sharps _____ office.  PAST MEDICAL HISTORY:  Significant for a benign nodule of the lung in 2000. She had a mastectomy in 02/91 with subsequent chemotherapy and radiation.  Her lymph node biopsy was benign at that time.  She did have Port-A-Cath placement for chemotherapy and still has the Port-A-Cath.  HOSPITAL COURSE: Problem #1 - INFECTIOUS DISEASE:  The patient was noted to have cellulitis and placed on Rocephin.  Blood cultures were drawn.  Two peripherally drawn ones were negative.  A blood culture from the Port-A-Cath was positive for gram-positive cocci.  Identification is still pending at the time of this dictation.  She continued on Rocephin and had good resolution of the cellulitis.  The arm is chronically edematous and she will be switched to Keflex 500 mg p.o. t.i.d. to finish a full 10 day course.  Further blood cultures were drawn through the Port-A-Cath.  They remained negative at one day.  With a normal white blood cell count and resolution of her symptoms, I feel that it is unlikely that she has a Port-A-Cath infection; however, I did caution her to watch for fever, sweats, or chills and to call her primary physician if this should happen.  Problem #2 - HYPERTENSION:  The patients blood pressure  was a little on the low side.  Her Cardura was held.  She was continued on Diovan 80 and Metoprolol.  Problem #3 - DIABETES MELLITUS:  Blood sugars were well-controlled.  She should resume her Glyburide at discharge.  FINAL DIAGNOSES: 1.  Left upper extremity cellulitis. 2.  Port-A-Cath since 1999. 3.  Diabetes mellitus, controlled.  DISCHARGE MEDICATIONS: 1.  Keflex 500 mg p.o. t.i.d. to complete a 10 day course. 2.  Cardura 2 mg p.o. q. d. 3.  Tamoxifen 10 mg p.o. b.i.d. 4.  Diovan 80 mg p.o. q. d. 5.  Coumadin 1 mg daily. 6.  Lopressor 50 mg p.o. q. d. 7.  Zocor 40 mg p.o. q. hs. 8.  Glyburide at previous dose.  DIET:  Diabetic with low salt.  ACTIVITY:  As tolerated.  FOLLOWUP:  She is to followup with Dr. Leanne Chang in 7-10 days and she is to call for the appointment. DD:  04/19/00 TD:  04/19/00 Job: [id number]

## 2011-02-26 NOTE — H&P (Signed)
[place]  Patient:    Samantha Mckenzie, Samantha Mckenzie                       MRN: [id number] Adm. Date:  [id number] Attending:  Linna Darner CC:         Darrick Penna. Swords, M.D. [facility]             Gustav C. Magrinat, M.D.             Selinda Orion, M.D.                         History and Physical  HISTORY OF PRESENT ILLNESS:  Jaquala Fuller is a 75 year old female, originally from [place], [place], who presents with rigor, fever, cellulitis, and lymphedema of rapid progression.  She had some chilling last night and was found to have a temperature of 101 this morning.  She was brought to the emergency room where significant lymphedema and cellulitis of the left upper extremity was found; the patient has been unaware of this.  She has had some nausea but denies any other symptoms.  Specifically there has been no sign of infection such as sore throat, purulent secretions from her head or chest, dysuria, diarrhea, or significant rash otherwise.  She denies any tick exposure.  PAST MEDICAL HISTORY:  Significant for mastectomy in February of 1999 followed by chemotherapy and radiation.  Subsequently, she had a lymph node biopsy which proved to be benign.  In 2000 in [place], [place] a benign nodule of the lung was biopsied.  MEDICATIONS: 1. Tamoxifen 10 mg daily. 2. Coumadin 1 mg daily. 3. Lopressor 50 mg daily. 4. Doxazosin 2 mg daily. 5. She is also on valsartan and simvastatin, but the dosages are not listed.  ALLERGIES:  She is intolerant or allergic to CODEINE, SULFA, and MORPHINE.  SOCIAL HISTORY:  She does not drink nor smoke.  She and her husband moved from [place].  REVIEW OF SYSTEMS:  As noted above.  Although she is a diabetic and takes glyburide of unknown dose, she denies hypoglycemia, polyphaga, polyuria, or polydipsia.  She has had no nonhealing lesions of her feet.  She does see an ophthalmologist on a regular  basis.  Her glucose this morning was 177.  PHYSICAL EXAMINATION:  GENERAL:  She is mildly overweight; she is in no acute distress.  Normal sinus rhythm on telemetry at 95.  VITAL SIGNS:  Blood pressure 129/60.  O2 saturation are 97% on room air. Respiratory rate is 20.  She has no lymphadenopathy about the head, neck or axilla.  HEENT:  Fundi examination reveals some arterial narrowing.  Otolaryngologic examination and oropharynx are unremarkable.  CHEST:  Clear with no ______ breathing.  She has an S4 with no significant murmurs.  ABDOMEN:  Bowel sounds are present.  She has no organomegaly or tenderness.  EXTREMITIES:  Pedal pulses are intact and she has no edema.  Homans sign is negative.  There are no striking changes, there is a brawny, erythematous, lymphedematous change of the left upper extremity extending from the shoulder joint to the antecubital area.  She has had a mastectomy on the left as well.  PLAN:  She will be admitted for intravenous antibiotics because of the rapid progression of the cellulitis in the setting of diabetes of questionable control.  Additionally, she will be monitored for thrombophlebitis in that extremity.  She will be continued on  the Coumadin and prothrombin time monitored.  Cultures will be collected prior to the initiation of the Rocephin.  A copy of this will be sent to Dr. Gunnar Bulla Magrinat, Dr. Phoebe Sharps, and to Dr. Rexford Maus to facilitate continuity of care.  She does have an appointment to see Dr. Ubaldo Glassing in September. DD:  04/17/00 TD:  04/18/00 Job: [id number]

## 2011-02-26 NOTE — Assessment & Plan Note (Signed)
Samantha Mckenzie OFFICE NOTE   Samantha Mckenzie                      MRN:          [id number]  DATE:01/19/2007                            DOB:          1936-01-02    REFERRING PHYSICIAN:  Kathlene November, MD   REASON FOR CONSULTATION:  Reflux disease.   HISTORY:  This is a 75 year old female with a history of hypertension,  hyperlipidemia, osteoarthritis, and breast cancer, for which she is  status post left mastectomy, as well as chemotherapy in 1999.  She was  evaluated previously for anemia and questionable history of colon  polyps.  In September of 2001 she underwent complete colonoscopy.  She  was found to have sigmoid diverticulosis and internal hemorrhoids only.  She has not been seen since.  She is referred through the courtesy of  Dr. Larose Kells regarding reflux disease.  The patient reports reflux symptoms  over the past 2 years.  Symptoms are worse with dietary indiscretion and  late at night.  She denies nausea or vomiting, or dysphagia.  No  abdominal pain.  Her bowel habits are regular.  No bleeding.  Her  appetite and weight are stable.  For her reflux symptoms, she uses Tums.  It was recommended that she take Prilosec.  She is not certain if that  helped.   PAST MEDICAL HISTORY:  1. Hypertension.  2. Hyperlipidemia.  3. Arthritis.  4. Left breast cancer.   PAST SURGICAL HISTORY:  Left mastectomy.   ALLERGIES:  CODEINE, MORPHINE, PERCOCET, SULFATE, TRAMADOL.   CURRENT MEDICATIONS:  1. Allopurinol 100 mg daily.  2. Arimidex 1 mg daily.  3. Diovan 80 mg daily.  4. Doxazosin 2 mg daily.  5. Metoprolol 25 mg b.i.d.  6. Zocor 40 mg at night.   FAMILY HISTORY:  No family history of gastrointestinal malignancy.  Mother with a history of ovarian cancer, 3 siblings with diabetes.   SOCIAL HISTORY:  The patient is married with 2 sons.  She lives with her  husband.  She does not smoke.  She rarely has a  glass of wine.   REVIEW OF SYSTEMS:  Per diagnostic evaluation form.   PHYSICAL EXAM:  Well-appearing female in no acute distress.  Blood pressure is 142/60, heart rate 64, weight is 155.2 pounds.  She is  5 feet 3 inches in height.  HEENT:  Sclerae anicteric.  Conjunctivae pink.  Oral mucosa is intact.  There is no adenopathy.  LUNGS:  Clear.  HEART:  Regular.  ABDOMEN:  Soft without tenderness, mass, or hernia.   IMPRESSION:  1. Chronic gastroesophageal reflux disease without alarm symptoms.  2. Multiple general medical problems as listed, including breast      cancer.  3. Colonoscopy in 2001 revealing diverticulosis and hemorrhoids only.   RECOMMENDATIONS:  1. Discussion today regarding reflux disease.  Brochure on the topic      as well as reflux precautions has been provided.  2. Schedule upper endoscopy to evaluate reflux symptoms and exclude      complications, such as esophagitis or Barrett's esophagus.  The  nature of the procedure, as well as the risks, benefits, and      alternatives were reviewed.  She understood and agreed to proceed.  3. Okay to use antacids at this time.  We will reconsider medical      therapy after the results of her endoscopy are known.     Docia Chuck. Henrene Pastor, MD  Electronically Signed    JNP/MedQ  DD: 01/19/2007  DT: 01/19/2007  Job #: [id number]   cc:   Kathlene November, MD

## 2011-02-26 NOTE — Op Note (Signed)
   Samantha Mckenzie, Samantha Mckenzie                        ACCOUNT NO.:  [id number]   MEDICAL RECORD NO.:  [id number]                   PATIENT TYPE:  AMB   LOCATION:  [facility]                                 FACILITY:  [facility]   PHYSICIAN:  Selinda Orion, M.D.              DATE OF BIRTH:  07/04/1936   DATE OF PROCEDURE:  05/17/2002  DATE OF DISCHARGE:  05/17/2002                                 OPERATIVE REPORT   PREOPERATIVE DIAGNOSIS:  Endometrial polyp on tamoxifen therapy.   POSTOPERATIVE DIAGNOSIS:  Endometrial polyp on tamoxifen therapy.   PROCEDURE:  Hysteroscopy, resection of endometrial polyp, and endometrial  ablation.   SURGEON:  Selinda Orion, M.D.   ANESTHESIA:  Paracervical block and IV sedation.   DESCRIPTION OF PROCEDURE:  Under excellent anesthesia as above, with the  patient prepped and draped in the [place], the cervix was grasped  with a single-tooth tenaculum, progressively dilated with series of Pratt  dilators to accommodate a 7 mm resectoscope.  The resectoscope was then  introduced and the endometrial cavity evaluated.  There was a large polyp  protruding from the anterior wall. The polyp was progressively resected  until the entire polyp was removed.  At this point, the entire endometrial  cavity was then also resected with the resectoscope with 90 degree loop.  Once all of the tissue had been removed throughout the entire endometrial  cavity, the VaporTrode electrode was then used to eliminate any islands of  viable endometrial tissue close to the surface.  At this point, there was no  significant bleeding at reduced pressure.  The resected tissue was all  removed and no significant bleeding at reduced pressure.  The instruments  were removed and the patient returned to the recovery room in excellent  condition.                                                Selinda Orion, M.D.    CWL/MEDQ  D:  06/06/2002  T:  06/06/2002  Job:  [id number]

## 2011-02-26 NOTE — Procedures (Signed)
[facility]  Patient:    Samantha Mckenzie, GATCHEL                      MRN: [id number] Proc. Date: 06/21/00 Adm. Date:  [id number] Attending:  Neita Garnet CC:         Darrick Penna Swords, M.D. [facility]  Sarajane Jews C. Magrinat, M.D.   Procedure Report  PROCEDURE:  Colonoscopy.  INDICATIONS FOR PROCEDURE:  Anemia and questionable history of colon polyps.  HISTORY OF PRESENT ILLNESS:  This 75 year old female with a history of breast cancer is evaluated in the office June 01, 2000 for anemia, questionable history of colon polyps and consideration of colonoscopy. She is now for that exam. Coumadin has been held prior to the procedure. Preoperative coagulation studies are normal. She is now for the exam.  The nature of the procedure as well as the risks, benefits, and alternatives were reviewed. She understood and agreed to proceed.  PHYSICAL EXAMINATION:  GENERAL:  A well-appearing female in no acute distress. She is alert and oriented.  VITAL SIGNS:  Stable.  LUNGS:  Clear.  HEART:  Regular.  ABDOMEN:  Soft.  DESCRIPTION OF PROCEDURE:  After informed consent was obtained, the patient was sedated with 50 mcg of fentanyl and 5 mg of Versed IV. Rectal exam was performed and found to be unremarkable. The Olympus colonoscope was passed under direct vision per rectum and advanced through the entire length of the colon to the cecal tip. Preparation was excellent. Careful examination of the colonic mucosa from the cecal tip to rectum revealed mild sigmoid diverticulosis. Retroflexed view of the rectum demonstrated internal hemorrhoids. No other abnormalities.  IMPRESSION: 1. Sigmoid diverticulosis. 2. Internal hemorrhoids. 3. No evidence of neoplasia.  RECOMMENDATIONS: 1. Resume previous medications. 2. Medical follow-up with Dr. Leanne Chang. DD:  06/21/00 TD:  06/22/00 Job: [id number]

## 2011-03-01 ENCOUNTER — Other Ambulatory Visit: Payer: Self-pay | Admitting: Internal Medicine

## 2011-03-05 ENCOUNTER — Other Ambulatory Visit: Payer: Self-pay | Admitting: Internal Medicine

## 2011-03-18 ENCOUNTER — Other Ambulatory Visit: Payer: Self-pay | Admitting: Internal Medicine

## 2011-05-03 ENCOUNTER — Ambulatory Visit (INDEPENDENT_AMBULATORY_CARE_PROVIDER_SITE_OTHER): Payer: Medicare Other | Admitting: Internal Medicine

## 2011-05-03 ENCOUNTER — Encounter: Payer: Self-pay | Admitting: Internal Medicine

## 2011-05-03 VITALS — BP 146/74 | HR 64 | Wt 144.0 lb

## 2011-05-03 DIAGNOSIS — R5383 Other fatigue: Secondary | ICD-10-CM

## 2011-05-03 DIAGNOSIS — R5381 Other malaise: Secondary | ICD-10-CM

## 2011-05-03 DIAGNOSIS — F329 Major depressive disorder, single episode, unspecified: Secondary | ICD-10-CM | POA: Insufficient documentation

## 2011-05-03 DIAGNOSIS — F32A Depression, unspecified: Secondary | ICD-10-CM | POA: Insufficient documentation

## 2011-05-03 MED ORDER — ESCITALOPRAM OXALATE 10 MG PO TABS: 10.0000 mg | ORAL_TABLET | Freq: Every day | ORAL | Status: DC

## 2011-05-03 NOTE — Patient Instructions (Signed)
Start Lexapro, call if side effects. Take it at night.

## 2011-05-03 NOTE — Assessment & Plan Note (Signed)
Patient is clearly depressed due to a number of factors. See history of present illness. At this point I recommend counseling and medication.  We talked about different options and I think Lexapro is a good one  Information about counselors in the area and the benefits of counseling discussed.

## 2011-05-03 NOTE — Progress Notes (Signed)
  Subjective:    Patient ID: Samantha Mckenzie, female    DOB: 03/02/1936, 75 y.o.   MRN: [id number]  HPI Followup from previous visit. Here with her sister who is visiting from [place]  As soon as we started talking, it become clear that she is depressed. On further questioning, she has been feeling depressed for months, frequent crying, feeling sad. She is concerned because her husband has dementia, her son is divorced, does not see her grand kids frequently. She was also diagnosed with macular degeneration and obviously she distress about the dx.  Past Medical History  Diagnosis Date  . Breast ca     surgery, chemo, XRT; had peripheral neuropathy (imbalance at times) after chemo  . Anemia   . Diabetes mellitus   . Gout   . Hyperlipidemia   . Hypertension   . Renal insufficiency   . Osteoarthritis   . GERD (gastroesophageal reflux disease)     gastritis, EGD 02/2007  . Allergic rhinitis   . Depression   . Macular degeneration 2011   Past Surgical History  Procedure Date  . Mastectomy 1999    and lymphnodes   . Lung biopsy 1999    neg  . Cataracts bilaterally 1 -2012     Review of Systems Continue to be fatigued She was seen with neck pain, that seems better; also she denies actual headaches, from time to time her scalp feels tender. No fever or weight loss, some aches and pains. No rash No chest pain, shortness of breath or cough. Denies any suicidal ideas but she has lost her desire to get out and walk, her appetite comes and goes.     Objective:   Physical Exam  Constitutional: She is oriented to person, place, and time. She appears well-developed and well-nourished.       tearfull  HENT:  Head: Normocephalic and atraumatic.  Cardiovascular: Normal rate, regular rhythm and normal heart sounds.   No murmur heard. Pulmonary/Chest: Breath sounds normal. No respiratory distress. She has no wheezes. She has no rales.  Musculoskeletal: She exhibits no edema.    Neurological: She is alert and oriented to person, place, and time.  Skin: She is not diaphoretic.  Psychiatric:       Obviously sad, tearful but coherent  And  cooperative          Assessment & Plan:  Today , I spent more than 25 min with the patient, >50% of the time counseling

## 2011-05-03 NOTE — Assessment & Plan Note (Signed)
Continued to be fatigued, recent labs negative, sedimentation rate was elevated to 79. Today, it is clear that she is depressed. We'll have to reassess fatigue once the depression is better. Also, when she comes back we'll recheck a set rate

## 2011-05-20 ENCOUNTER — Encounter (INDEPENDENT_AMBULATORY_CARE_PROVIDER_SITE_OTHER): Payer: Medicare Other | Admitting: Ophthalmology

## 2011-06-02 ENCOUNTER — Inpatient Hospital Stay (HOSPITAL_COMMUNITY)
Admission: AD | Admit: 2011-06-02 | Discharge: 2011-06-04 | DRG: 602 | Disposition: A | Discharge status: Home / Self Care | Payer: Medicare Other | Source: Other Acute Inpatient Hospital | Attending: Family Medicine | Admitting: Family Medicine

## 2011-06-02 ENCOUNTER — Emergency Department (HOSPITAL_BASED_OUTPATIENT_CLINIC_OR_DEPARTMENT_OTHER)
Admission: EM | Admit: 2011-06-02 | Discharge: 2011-06-02 | Disposition: A | Discharge status: Other Acute Inpatient Hospital | Payer: Medicare Other | Source: Home / Self Care | Attending: Emergency Medicine | Admitting: Emergency Medicine

## 2011-06-02 ENCOUNTER — Emergency Department (INDEPENDENT_AMBULATORY_CARE_PROVIDER_SITE_OTHER): Payer: Medicare Other

## 2011-06-02 ENCOUNTER — Other Ambulatory Visit: Payer: Self-pay

## 2011-06-02 ENCOUNTER — Emergency Department (HOSPITAL_BASED_OUTPATIENT_CLINIC_OR_DEPARTMENT_OTHER): Payer: Medicare Other

## 2011-06-02 ENCOUNTER — Inpatient Hospital Stay (HOSPITAL_COMMUNITY): Payer: Medicare Other

## 2011-06-02 ENCOUNTER — Encounter (HOSPITAL_BASED_OUTPATIENT_CLINIC_OR_DEPARTMENT_OTHER): Payer: Self-pay | Admitting: Emergency Medicine

## 2011-06-02 DIAGNOSIS — R9431 Abnormal electrocardiogram [ECG] [EKG]: Secondary | ICD-10-CM

## 2011-06-02 DIAGNOSIS — M79609 Pain in unspecified limb: Secondary | ICD-10-CM

## 2011-06-02 DIAGNOSIS — R079 Chest pain, unspecified: Secondary | ICD-10-CM

## 2011-06-02 DIAGNOSIS — I10 Essential (primary) hypertension: Secondary | ICD-10-CM | POA: Insufficient documentation

## 2011-06-02 DIAGNOSIS — E119 Type 2 diabetes mellitus without complications: Secondary | ICD-10-CM | POA: Diagnosis present

## 2011-06-02 DIAGNOSIS — IMO0002 Reserved for concepts with insufficient information to code with codable children: Principal | ICD-10-CM | POA: Diagnosis present

## 2011-06-02 DIAGNOSIS — A419 Sepsis, unspecified organism: Secondary | ICD-10-CM | POA: Diagnosis present

## 2011-06-02 DIAGNOSIS — Z853 Personal history of malignant neoplasm of breast: Secondary | ICD-10-CM

## 2011-06-02 DIAGNOSIS — R55 Syncope and collapse: Secondary | ICD-10-CM

## 2011-06-02 DIAGNOSIS — I129 Hypertensive chronic kidney disease with stage 1 through stage 4 chronic kidney disease, or unspecified chronic kidney disease: Secondary | ICD-10-CM | POA: Diagnosis present

## 2011-06-02 DIAGNOSIS — K219 Gastro-esophageal reflux disease without esophagitis: Secondary | ICD-10-CM | POA: Insufficient documentation

## 2011-06-02 DIAGNOSIS — R51 Headache: Secondary | ICD-10-CM | POA: Insufficient documentation

## 2011-06-02 DIAGNOSIS — R42 Dizziness and giddiness: Secondary | ICD-10-CM

## 2011-06-02 DIAGNOSIS — F329 Major depressive disorder, single episode, unspecified: Secondary | ICD-10-CM | POA: Diagnosis present

## 2011-06-02 DIAGNOSIS — F3289 Other specified depressive episodes: Secondary | ICD-10-CM | POA: Diagnosis present

## 2011-06-02 DIAGNOSIS — Z79899 Other long term (current) drug therapy: Secondary | ICD-10-CM | POA: Insufficient documentation

## 2011-06-02 DIAGNOSIS — E785 Hyperlipidemia, unspecified: Secondary | ICD-10-CM | POA: Insufficient documentation

## 2011-06-02 DIAGNOSIS — R112 Nausea with vomiting, unspecified: Secondary | ICD-10-CM | POA: Insufficient documentation

## 2011-06-02 DIAGNOSIS — L0390 Cellulitis, unspecified: Secondary | ICD-10-CM

## 2011-06-02 DIAGNOSIS — R5381 Other malaise: Secondary | ICD-10-CM | POA: Diagnosis present

## 2011-06-02 DIAGNOSIS — N184 Chronic kidney disease, stage 4 (severe): Secondary | ICD-10-CM | POA: Diagnosis present

## 2011-06-02 LAB — COMPREHENSIVE METABOLIC PANEL
AST: 27 U/L (ref 0–37)
BUN: 41 mg/dL — ABNORMAL HIGH (ref 6–23)
CO2: 25 mEq/L (ref 19–32)
Chloride: 99 mEq/L (ref 96–112)
Creatinine, Ser: 1.9 mg/dL — ABNORMAL HIGH (ref 0.50–1.10)
GFR calc Af Amer: 31 mL/min — ABNORMAL LOW (ref >60)
GFR calc non Af Amer: 26 mL/min — ABNORMAL LOW (ref >60)
Glucose, Bld: 201 mg/dL — ABNORMAL HIGH (ref 70–99)
Total Bilirubin: 0.3 mg/dL (ref 0.3–1.2)

## 2011-06-02 LAB — GLUCOSE, CAPILLARY

## 2011-06-02 LAB — URINALYSIS, ROUTINE W REFLEX MICROSCOPIC
Bilirubin Urine: NEGATIVE
Ketones, ur: NEGATIVE mg/dL
Nitrite: NEGATIVE
Specific Gravity, Urine: 1.01 (ref 1.005–1.030)
Urobilinogen, UA: 0.2 mg/dL (ref 0.0–1.0)

## 2011-06-02 LAB — CBC
HCT: 33.7 % — ABNORMAL LOW (ref 36.0–46.0)
Hemoglobin: 11.5 g/dL — ABNORMAL LOW (ref 12.0–15.0)
MCH: 31.6 pg (ref 26.0–34.0)
MCV: 92.6 fL (ref 78.0–100.0)
Platelets: 212 10*3/uL (ref 150–400)
RBC: 3.64 MIL/uL — ABNORMAL LOW (ref 3.87–5.11)
WBC: 9.3 10*3/uL (ref 4.0–10.5)

## 2011-06-02 LAB — CARDIAC PANEL(CRET KIN+CKTOT+MB+TROPI): CK, MB: 1.5 ng/mL (ref 0.3–4.0)

## 2011-06-02 LAB — URINE MICROSCOPIC-ADD ON

## 2011-06-02 LAB — MRSA PCR SCREENING: MRSA by PCR: POSITIVE — AB

## 2011-06-02 MED ORDER — VANCOMYCIN HCL IN DEXTROSE 1-5 GM/200ML-% IV SOLN
1000.0000 mg | Freq: Once | INTRAVENOUS | Status: AC
  Administered 2011-06-02: 1000 mg via INTRAVENOUS
  Filled 2011-06-02: qty 200

## 2011-06-02 MED ORDER — XENON XE 133 GAS
5.3000 | GAS_FOR_INHALATION | Freq: Once | RESPIRATORY_TRACT | Status: AC | PRN
  Administered 2011-06-02: 5 via RESPIRATORY_TRACT

## 2011-06-02 MED ORDER — SODIUM CHLORIDE 0.9 % IV SOLN
Freq: Once | INTRAVENOUS | Status: AC
  Administered 2011-06-02: 08:00:00 via INTRAVENOUS

## 2011-06-02 MED ORDER — ENOXAPARIN SODIUM 150 MG/ML ~~LOC~~ SOLN
1.0000 mg/kg | Freq: Once | SUBCUTANEOUS | Status: AC
  Administered 2011-06-02: 65 mg via SUBCUTANEOUS

## 2011-06-02 MED ORDER — TECHNETIUM TO 99M ALBUMIN AGGREGATED
5.4000 | Freq: Once | INTRAVENOUS | Status: AC | PRN
  Administered 2011-06-02: 5 via INTRAVENOUS

## 2011-06-02 MED ORDER — ONDANSETRON HCL 4 MG/2ML IJ SOLN
4.0000 mg | Freq: Once | INTRAMUSCULAR | Status: AC
  Administered 2011-06-02: 4 mg via INTRAVENOUS
  Filled 2011-06-02: qty 2

## 2011-06-02 MED ORDER — PIPERACILLIN-TAZOBACTAM 3.375 G IVPB
3.3750 g | Freq: Once | INTRAVENOUS | Status: DC
  Administered 2011-06-02: 3.375 g via INTRAVENOUS
  Filled 2011-06-02: qty 50

## 2011-06-02 MED ORDER — ACETAMINOPHEN 325 MG PO TABS
ORAL_TABLET | ORAL | Status: AC
  Administered 2011-06-02: 650 mg
  Filled 2011-06-02: qty 2

## 2011-06-02 MED ORDER — ENOXAPARIN SODIUM 100 MG/ML ~~LOC~~ SOLN
SUBCUTANEOUS | Status: AC
  Administered 2011-06-02: 09:00:00
  Filled 2011-06-02: qty 1

## 2011-06-02 NOTE — Progress Notes (Signed)
CT head negative, OK to give lovenox

## 2011-06-02 NOTE — ED Notes (Signed)
Chart reviewed and care assumed.  Pt resting quietly. VSS.

## 2011-06-02 NOTE — ED Notes (Signed)
CBG 191

## 2011-06-02 NOTE — Progress Notes (Signed)
Called to bedside for low BP, change from previous. Pt alert and oriented. Pt states she feels the same as previous. Denies worsening/new weakness/dizziness/cp/sob/chills.  No h/o CHF. Will bolus with IVF and reassess  Filed Vitals:   06/02/11 0915  BP: 90/50  Pulse:   Temp:   Resp:

## 2011-06-02 NOTE — ED Notes (Signed)
Dr. Justin Mend informed of BP, new orders received and IVF bolus initiated.

## 2011-06-02 NOTE — ED Notes (Signed)
NSR per monitor HR 92.  Family at bedside.

## 2011-06-02 NOTE — ED Notes (Signed)
Report called to unit nurse Magdalene Molly, RN

## 2011-06-02 NOTE — ED Notes (Signed)
Belongings sent with son.

## 2011-06-02 NOTE — ED Notes (Signed)
Urine collected.  Pt ambulatory to BR.  Pt transported to CT via stretcher.  Pt and family updated of plan of care.

## 2011-06-02 NOTE — ED Notes (Signed)
Returned from radiology. IV ABX started and pt placed on telemetry.

## 2011-06-02 NOTE — ED Notes (Signed)
Pt c/o nasuea

## 2011-06-02 NOTE — ED Notes (Signed)
[facility] at bedside for transport. 

## 2011-06-02 NOTE — ED Provider Notes (Signed)
History     CSN: [id number] Arrival date & time: No admission date for patient encounter.  Chief Complaint  Patient presents with  . Nausea   Patient is a 75 y.o. female presenting with vomiting. The history is provided by the patient. No language interpreter was used.  Emesis  This is a new problem. The current episode started 3 to 5 hours ago. Episode frequency: dry heaves only. The problem has not changed since onset.There has been no fever. Associated symptoms include arthralgias. Pertinent negatives include no abdominal pain, no chills, no cough, no diarrhea, no fever, no headaches, no myalgias, no sweats and no URI. Associated symptoms comments: Left arm pain and near syncope. Risk factors: none.  No CP, no SOB today.    Past Medical History  Diagnosis Date  . Breast ca     surgery, chemo, XRT; had peripheral neuropathy (imbalance at times) after chemo  . Anemia   . Diabetes mellitus   . Gout   . Hyperlipidemia   . Hypertension   . Renal insufficiency   . Osteoarthritis   . GERD (gastroesophageal reflux disease)     gastritis, EGD 02/2007  . Allergic rhinitis   . Depression   . Macular degeneration 2011    Past Surgical History  Procedure Date  . Mastectomy 1999    and lymphnodes   . Lung biopsy 1999    neg  . Cataracts bilaterally 1 -2012    Family History  Problem Relation Age of Onset  . Breast cancer    . Colon cancer Neg Hx   . Heart attack Father 75  . Diabetes Sister     2    History  Substance Use Topics  . Smoking status: Never Smoker   . Smokeless tobacco: Not on file  . Alcohol Use: Yes     rarely    OB History    Grav Para Term Preterm Abortions TAB SAB Ect Mult Living                  Review of Systems  Constitutional: Negative for fever and chills.  Respiratory: Negative for cough.   Cardiovascular: Negative for chest pain and leg swelling.  Gastrointestinal: Positive for nausea and vomiting. Negative for abdominal pain, diarrhea,  constipation and rectal pain.  Genitourinary: Negative for difficulty urinating.  Musculoskeletal: Positive for arthralgias. Negative for myalgias.  Skin: Negative.   Neurological: Negative for dizziness, tremors, seizures, weakness, numbness and headaches.  Hematological: Negative.   Psychiatric/Behavioral: Negative.     Physical Exam  BP 138/63  Pulse 112  Temp(Src) 98.6 F (37 C) (Oral)  Resp 18  SpO2 96%  Physical Exam  Constitutional: She is oriented to person, place, and time. She appears well-developed and well-nourished. No distress.  HENT:  Head: Normocephalic and atraumatic.  Eyes: EOM are normal. Pupils are equal, round, and reactive to light.  Neck: Normal range of motion. Neck supple. No JVD present.  Cardiovascular: Regular rhythm, normal heart sounds and intact distal pulses.  Exam reveals no gallop and no friction rub.   No murmur heard. Pulmonary/Chest: Effort normal and breath sounds normal. No respiratory distress.  Abdominal: Soft. Bowel sounds are normal. She exhibits no distension and no mass. There is no tenderness. There is no rebound and no guarding.  Musculoskeletal: Normal range of motion.       Left upper arm: She exhibits swelling. She exhibits no bony tenderness.       Warmth, erythema and induration  of the LUE, no crepitus.  No effusion  Neurological: She is alert and oriented to person, place, and time. No cranial nerve deficit.  Skin: Skin is warm and dry.  Psychiatric: She has a normal mood and affect.    ED Course  Procedures  MDM  Date: 06/02/2011  Rate: 116  Rhythm: sinus tachycardia  QRS Axis: normal  Intervals: normal  ST/T Wave abnormalities: normal  Conduction Disutrbances:none  Narrative Interpretation: IMI age undetermined  Old EKG Reviewed: changes noted        Bonnell Placzek Alfonso Patten, MD 06/02/11 [phone]

## 2011-06-02 NOTE — ED Notes (Signed)
Report called to [facility], [facility].  Pt and family informed of plan of care.

## 2011-06-03 LAB — GLUCOSE, CAPILLARY
Glucose-Capillary: 93 mg/dL (ref 70–99)
Glucose-Capillary: 110 mg/dL — ABNORMAL HIGH (ref 70–99)
Glucose-Capillary: 90 mg/dL (ref 70–99)
Glucose-Capillary: 78 mg/dL (ref 70–99)

## 2011-06-03 LAB — BASIC METABOLIC PANEL
Calcium: 9.1 mg/dL (ref 8.4–10.5)
Creatinine, Ser: 2.12 mg/dL — ABNORMAL HIGH (ref 0.50–1.10)
GFR calc Af Amer: 28 mL/min — ABNORMAL LOW (ref >60)
GFR calc non Af Amer: 23 mL/min — ABNORMAL LOW (ref >60)
Sodium: 139 mEq/L (ref 135–145)

## 2011-06-03 LAB — URINE CULTURE
Culture  Setup Time: 201208230120
Culture: NO GROWTH
Special Requests: NEGATIVE

## 2011-06-03 LAB — HEMOGLOBIN A1C: Hgb A1c MFr Bld: 6.5 % — ABNORMAL HIGH (ref <5.7)

## 2011-06-03 LAB — CBC: RDW: 15.3 % (ref 11.5–15.5)

## 2011-06-03 NOTE — H&P (Signed)
NAME:  Samantha Mckenzie, Samantha Mckenzie NO.:  [id number]  MEDICAL RECORD NO.:  [id number]  LOCATION:  7096                         FACILITY:  [facility]  PHYSICIAN:  Murray Hodgkins, MD    DATE OF BIRTH:  Aug 02, 1936  DATE OF ADMISSION:  06/02/2011 DATE OF DISCHARGE:                             HISTORY & PHYSICAL   REFERRING PHYSICIAN:  Dr. Randal Buba at [facility].  PRIMARY CARE PHYSICIAN:  Dr. Larose Kells.  PRIMARY NEPHROLOGIST:  Dr. Moshe Cipro.  CHIEF COMPLAINT:  Weakness.  HISTORY OF PRESENT ILLNESS:  This is a 75 year old woman who presented to [facility] today with left arm pain.  She knows that over the last 6 weeks, she has had very poor appetite and general fatigue as well as headache.  She saw her primary care physician in the outpatient setting and was diagnosed with depression and started on Lexapro, which has really not helped.  Otherwise, she felt in her usual state; however, she was cold last night and she fell asleep on her left arm.  She woke up at about midnight and her left arm was hurting without relief.  She initially thought it was because she had been lying on it; however, again at 4 a.m., she woke up and her arm continued to increase in pain.  She felt generally weak and has some nausea as well as presyncope, so she came to the emergency room for further evaluation.  In the emergency room, she was noted to be tachycardic and initially normotensive.  Further investigation was conducted including a negative CT of the head and chest x-ray.  However, for unclear reasons, a D-dimer was checked, which was positive.  CT scan was not able to be performed secondary to the patient's chronic kidney disease and so a V/Q scan was under consideration on transfer.  I discussed the case with Dr. Randal Buba who contacted me for transfer to [facility] for further evaluation and treatment.  She reported the patient felt "foggy headed."  Dr. Lynnae January clinical  impression was left arm cellulitis.  Of note, there is no chest pain or shortness of breath and her EKG had no acute changes.  She was given Lovenox, Zofran, Zosyn, and vancomycin as well as Tylenol.  She was felt to be hemodynamically stable and was accepted to a telemetry bed.  [facility] contacted me on arrival to [facility] at 10:04 this morning. They told me that when they arrived at [facility] to pick up the patient, her blood pressure was 90/40, which they did verify manually, also on the monitor was a systolic blood pressure of 70.  I was not apprised of this by Dr. Randal Buba or the staff at [facility] to transfer.  In reviewing notes from the emergency room, I do note recorded blood pressure of 90/50 and an order for a bolus was given.  Again, this was not communicated to the accepting physician. Per [facility], the patient's vital signs on arrival were blood pressure of 94/48, heart rate of 72, respiratory rate of 14 to 15, and oxygen saturation of 90% on 2 L.  Here in the ICU now, blood pressure is 106/44 and  vital signs appear to be stable.  REVIEW OF SYSTEMS:  Positive for visual changes from macular degeneration.  Negative for sore throat, rash, chest pain, shortness of breath, vomiting, dysuria, bleeding, abdominal pain, or diarrhea.  PAST MEDICAL HISTORY: 1. Depression. 2. Macular degeneration. 3. Left-sided breast cancer. 4. Anemia. 5. Diabetes mellitus, previously hypoglycemic with treatment.  Not     currently being treated with medications. 6. Gout. 7. Hypertension. 8. Chronic kidney disease, stage IV, followed by Dr. Moshe Cipro. 9. GERD. 10.History of at least 2 bouts of cellulitis of the left upper     extremity in the past.  PAST SURGICAL HISTORY: 1. Left mastectomy. 2. Lung biopsy, which was benign. 3. Bilateral cataract extraction.  SOCIAL HISTORY:  Nonsmoker and nondrinker.  She lives in [place] with her husband.  FAMILY  HISTORY:  Father had heart disease and died in his 57s.  ALLERGIES: 1. CODEINE and 2. MORPHINE SULFATE, which cause nausea. 3. SULFA, which causes nausea. 4. OXYCODONE and 5. TRAMADOL are also listed.  MEDICATIONS:  The patient does not have a list with her.  After reviewing the emergency room documentation, I do not see any medications documented.  We will await medical reconciliation.  PHYSICAL EXAMINATION:  GENERAL:  A well-developed and well-nourished woman, currently sitting up in bed in no acute distress.  The patient appears to be nontoxic. VITAL SIGNS:  Noted to be afebrile at 98.6, blood pressure, heart rate, and oxygenation stable.  These have been reviewed. HEAD:  Appears to be normal. EYES:  Sclerae clear.  Pupils equal, round, and reactive to light with irides and conjunctivae appear unremarkable. ENT:  Hearing is grossly normal.  Lips and tongue appear normal. NECK:  Supple.  No lymphadenopathy or masses.  No thyromegaly. CHEST:  Clear to auscultation bilaterally.  No wheezes, rales, or rhonchi.  There is normal respiratory effort. CARDIOVASCULAR:  Regular rate and rhythm.  No murmur, rub, or gallop. ABDOMEN:  Soft, nontender, and nondistended.  No masses are appreciated. SKIN:  Excluding the left upper extremity appears to be unremarkable. MUSCULOSKELETAL:  Digits and nails of upper and lower extremities appear to be grossly unremarkable.  No lower extremity edema.  The left upper extremity is notable for lymphedema, which the patient says that usual side.  It is slightly erythematous from wrist up to upper arm, currently nontender to palpation.  It is warm to touch.  No fluctuance or abscess is noted.  No lesions seen.  IMAGING: 1. Chest x-ray, negative. 2. CT of the head, negative. 3. Nuclear medicine ventilation perfusion lung scan is pending.  EKG independently reviewed showed sinus rhythm with no acute changes seen.  Inferior infarct, age  unknown.  PERTINENT LABORATORY STUDIES: 1. Hemoglobin is 11.5, which appears to be stable. 2. Basic metabolic panel is notable for blood sugar of 201 and     creatinine of 1.90, which appears to be at the patient's baseline. 3. Mild elevation of serum calcium is seen at 10.6. 4. One set of cardiac markers negative. 5. D-dimer positive at 3.13. 6. Urinalysis equivocal.  ASSESSMENT AND PLAN:  This is a 75 year old woman presents with left upper extremity cellulitis and presumed sepsis. 1. Left upper extremity cellulitis with presumed sepsis.  The patient     was noted be hypotensive at [facility].  Currently is     normotensive now after 1 L bolus.  She has been admitted to the     step-down unit because of her hypotension and dizziness.  She has     already received Zosyn and vancomycin.  I doubt methicillin-     resistant Staphylococcus aureus at this point.  We will continue     her on Zosyn and follow clinically.  The patient appears to be     asymptomatic at this point.  We will continue IV fluids as needed. 2. Presumed sepsis.  Plan as above. 3. Generalized weakness with presyncope.  Presumably secondary to the     above.  We will obtain Physical Therapy consultation. 4. Positive D-dimer.  It is unclear why this test was obtained as the     patient has no history of shortness of breath or chest pain     documented or by clinical history.  Presumably this will be related     to her current infection.  She has already been given 1 therapeutic     dose of Lovenox and a V/Q scan is pending at this point.  If this     is negative, we would not pursue any further evaluation.  We will     determine her positive D-dimer to her acute infection and she has     no signs or symptoms suggestive of pulmonary embolism or venous     thromboembolism.  Her lymphedema is about its usual size. 5. Diet-controlled diabetes mellitus.  Noted to be elevated.  Here we     will check a  hemoglobin A1c and place her on sliding scale insulin. 6. Chronic normocytic anemia.  This appears to be stable. 7. Chronic kidney disease stage 4.  This appears to be stable.  We     will check baseline metabolic panel in the morning. 8. Code status.  The patient is full code.     Murray Hodgkins, MD     DG/MEDQ  D:  06/02/2011  T:  06/02/2011  Job:  [id number]  cc:   Kathlene November, MD [phone] [place], [place] [address]  Louis Meckel, M.D. Fax: [phone]  Electronically Signed by Murray Hodgkins  on 06/03/2011 07:40:20 AM

## 2011-06-04 LAB — BASIC METABOLIC PANEL
BUN: 29 mg/dL — ABNORMAL HIGH (ref 6–23)
Chloride: 105 mEq/L (ref 96–112)
GFR calc Af Amer: 28 mL/min — ABNORMAL LOW (ref >60)
GFR calc non Af Amer: 23 mL/min — ABNORMAL LOW (ref >60)
Potassium: 4.2 mEq/L (ref 3.5–5.1)
Sodium: 141 mEq/L (ref 135–145)

## 2011-06-04 LAB — CBC
HCT: 28.8 % — ABNORMAL LOW (ref 36.0–46.0)
Hemoglobin: 9.7 g/dL — ABNORMAL LOW (ref 12.0–15.0)
MCHC: 33.7 g/dL (ref 30.0–36.0)
RDW: 15.5 % (ref 11.5–15.5)
WBC: 6.4 10*3/uL (ref 4.0–10.5)

## 2011-06-04 LAB — GLUCOSE, CAPILLARY: Glucose-Capillary: 104 mg/dL — ABNORMAL HIGH (ref 70–99)

## 2011-06-07 LAB — GLUCOSE, CAPILLARY: Glucose-Capillary: 126 mg/dL — ABNORMAL HIGH (ref 70–99)

## 2011-06-08 ENCOUNTER — Other Ambulatory Visit: Payer: Self-pay | Admitting: Internal Medicine

## 2011-06-08 LAB — CULTURE, BLOOD (ROUTINE X 2)
Culture  Setup Time: 201208222233
Culture: NO GROWTH

## 2011-06-09 NOTE — Telephone Encounter (Signed)
Rx Done.

## 2011-06-16 ENCOUNTER — Other Ambulatory Visit: Payer: Self-pay | Admitting: Internal Medicine

## 2011-06-17 NOTE — Discharge Summary (Signed)
NAME:  Samantha Mckenzie, LAUMANN NO.:  [id number]  MEDICAL RECORD NO.:  [id number]  LOCATION:  3704                         FACILITY:  [facility]  PHYSICIAN:  Murray Hodgkins, MD    DATE OF BIRTH:  1936/05/03  DATE OF ADMISSION:  06/02/2011 DATE OF DISCHARGE:  06/04/2011                              DISCHARGE SUMMARY   PRIMARY CARE PHYSICIAN:  Kathlene November, MD  PRIMARY NEPHROLOGIST:  Louis Meckel, MD  CONDITION ON DISCHARGE:  Improved.  DISPOSITION:  Home.  DISCHARGE DIAGNOSES: 1. Left upper extremity cellulitis with probable associated sepsis. 2. Generalized weakness. 3. Diabetes mellitus type 2, diet controlled. 4. Stable chronic kidney disease stage 4.  HISTORY OF PRESENT ILLNESS:  This is a 75 year old woman who presented to [facility] with generalized weakness and left arm pain.  She was noted to have cellulitis in the left arm and was transferred to [facility] for further evaluation and treatment.  Prior to transfer, she was noted to be hypotensive.  HOSPITAL COURSE:  Ms. Deprey was admitted initially to the step-down unit for hypotension, however, her condition quickly improved and she was later transferred that evening.  She has been placed on empiric antibiotic therapy and continues to improve.  Blood pressure has been stable.  She is now stable for discharge and will complete a course of antibiotics in the outpatient setting.  She has been able to ambulate and she has been stable and will return home.  Her diabetes and chronic kidney disease have remained stable.  CONSULTATIONS:  None.  PROCEDURES:  None.  IMAGING: 1. Chest x-ray, August 22nd:  No evidence of acute cardiopulmonary     disease. 2. CT of the head, August 22nd:  No acute or significant findings. 3. Nuclear medicine ventilation perfusion lung scan, August 22nd:  Low     likelihood ratio for pulmonary embolism.  MICROBIOLOGY: 1. Urine culture x2, August 22nd,  insignificant growth. 2. Blood cultures x2, August 22nd, no growth to date.  ANCILLARY STUDIES:  Left upper extremity venous Doppler was negative for DVT.  PERTINENT LABORATORY STUDIES: 1. D-dimer was noted to be 3.13.  Please see history and physical for     full details.  It is not clear why this test was ordered. 2. Hemoglobin A1c was 6.5. 3. CBC notable for hemoglobin of 9.7 which is stable. 4. Capillary blood sugars stable. 5. Creatinine 2.09 which appears to be her baseline.  PHYSICAL EXAMINATION:  GENERAL:  On discharge, the patient is feeling well.  She feels like her arm has improved and she is ready to go home. VITAL SIGNS:  Temperature is 97.7, pulse 70, respirations 20, blood pressure 152/66, saturation 99% on room air. CARDIOVASCULAR:  Regular rate and rhythm.  No murmur, rub, or gallop. RESPIRATORY:  Clear to auscultation bilaterally.  No wheezes, rales, or rhonchi.  Normal respiratory effort. EXTREMITIES:  No lower extremity edema.  The left upper arm, she has chronic lymphedema without change.  No tenderness with palpation of the arm.  The skin has some sun damage and perhaps mild erythema.  Overall, she continues to improve.  DISCHARGE INSTRUCTIONS:  The patient will be discharged home  today. Diet is a heart-healthy diet.  Activity as tolerated.  Follow up with Dr. Larose Kells in about 2 weeks and Dr. Moshe Cipro as directed.  DISCHARGE MEDICATIONS:  New, 1. Augmentin 875 mg p.o. b.i.d. 2. Mupirocin 1 application nasally b.i.d., last dose August 27th.  Resume home medications, 1. Allopurinol 100 mg p.o. daily. 2. Amlodipine 5 mg p.o. nightly. 3. Aspirin 81 mg p.o. daily. 4. Atorvastatin 20 mg p.o. daily at bedtime. 5. Bromfenac 0.09% ophthalmic solution 1 drop in the right eye at     bedtime. 6. Calcium carbonate over the counter p.o. nightly. 7. Lexapro 10 mg p.o. daily at bedtime. 8. Nexium 40 mg p.o. daily. 9. Fexofenadine 60 mg p.o. daily as needed for  allergies. 10.ICaps eye vitamin multivitamin p.o. daily. 11.Metoprolol 50 mg p.o. nightly. 12.Multivitamin p.o. daily. 13.Prednisolone acetate 1% ophthalmic solution, use as directed. 14.Valsartan 320 mg p.o. daily. 15.Vitamin B12 one tablet p.o. nightly. 16.Vitamin D3 over the counter p.o. nightly.  Time coordinating discharge is 30 minutes.     Murray Hodgkins, MD     DG/MEDQ  D:  06/04/2011  T:  06/05/2011  Job:  [id number]  cc:   Kathlene November, MD [phone] [place], [place] [address]  Louis Meckel, M.D. Fax: [phone]  Electronically Signed by Murray Hodgkins  on 06/17/2011 09:54:07 PM

## 2011-06-22 ENCOUNTER — Ambulatory Visit (INDEPENDENT_AMBULATORY_CARE_PROVIDER_SITE_OTHER): Payer: Medicare Other | Admitting: Internal Medicine

## 2011-06-22 ENCOUNTER — Encounter: Payer: Self-pay | Admitting: Internal Medicine

## 2011-06-22 DIAGNOSIS — E538 Deficiency of other specified B group vitamins: Secondary | ICD-10-CM

## 2011-06-22 DIAGNOSIS — E119 Type 2 diabetes mellitus without complications: Secondary | ICD-10-CM

## 2011-06-22 DIAGNOSIS — C50919 Malignant neoplasm of unspecified site of unspecified female breast: Secondary | ICD-10-CM

## 2011-06-22 DIAGNOSIS — N259 Disorder resulting from impaired renal tubular function, unspecified: Secondary | ICD-10-CM

## 2011-06-22 DIAGNOSIS — E785 Hyperlipidemia, unspecified: Secondary | ICD-10-CM

## 2011-06-22 DIAGNOSIS — I10 Essential (primary) hypertension: Secondary | ICD-10-CM

## 2011-06-22 DIAGNOSIS — D649 Anemia, unspecified: Secondary | ICD-10-CM

## 2011-06-22 DIAGNOSIS — Z23 Encounter for immunization: Secondary | ICD-10-CM

## 2011-06-22 DIAGNOSIS — Z0000 Encounter for general adult medical examination without abnormal findings: Secondary | ICD-10-CM | POA: Insufficient documentation

## 2011-06-22 LAB — LIPID PANEL
LDL Cholesterol: 67 mg/dL (ref 0–99)
Total CHOL/HDL Ratio: 3
VLDL: 22.6 mg/dL (ref 0.0–40.0)

## 2011-06-22 LAB — CBC WITH DIFFERENTIAL/PLATELET
Basophils Absolute: 0 10*3/uL (ref 0.0–0.1)
HCT: 31.7 % — ABNORMAL LOW (ref 36.0–46.0)
Hemoglobin: 10.4 g/dL — ABNORMAL LOW (ref 12.0–15.0)
Lymphs Abs: 1.5 10*3/uL (ref 0.7–4.0)
MCV: 97.3 fl (ref 78.0–100.0)
Monocytes Absolute: 0.4 10*3/uL (ref 0.1–1.0)
Monocytes Relative: 6.1 % (ref 3.0–12.0)
Neutro Abs: 4.4 10*3/uL (ref 1.4–7.7)
Platelets: 221 10*3/uL (ref 150.0–400.0)
RDW: 16.5 % — ABNORMAL HIGH (ref 11.5–14.6)

## 2011-06-22 MED ORDER — ZOSTER VACCINE LIVE 19400 UNT/0.65ML ~~LOC~~ SOLR: 0.6500 mL | Freq: Once | SUBCUTANEOUS | Status: DC

## 2011-06-22 NOTE — Assessment & Plan Note (Signed)
Well controlled  Per last labs

## 2011-06-22 NOTE — Assessment & Plan Note (Addendum)
chart reviewed  Last Tetanus 2005 Last Pneumovax:  2005, 2011 shingles shot : Rx provided  PAP: saw Dr Ubaldo Glassing the last time 2011, was told no further PAPs MMG: due for one soon Breast exam today-- see physical    DEXAs per gyn , Dr Ubaldo Glassing told pt all DEXAs were neg, and he recommended no further testing; will re asses next year   Had a  colonoscopy ( 2001).  reports hemocults (-)  per gyn : due for Cscope , reluctant to have one done, discussed benefits   Diet exercise discussed

## 2011-06-22 NOTE — Assessment & Plan Note (Signed)
Recent B12 wnl

## 2011-06-22 NOTE — Progress Notes (Signed)
Subjective:    Patient ID: Samantha Mckenzie, female    DOB: [birth year], 75 y.o.   MRN: [id number]  HPI Here for Medicare AWV: 1. Risk factors based on Past M, S, F history: reviewed 2. Physical Activities: sedentary due to  fatigue    3. Depression/mood:  Denies depressive mood , does not look depress  4. Hearing:  No problems noted or reported 5. ADL's:  Independent , still drives  6. Fall Risk: no recent problems  7. home Safety: does feelsafe at home  8. Height, weight, &visual acuity: see VS, h/o macular degeneration, sees ophthalmology routinely 9. Counseling: provided 10. Labs ordered based on risk factors: if needed  11. Referral Coordination: if needed 12.  Care Plan, see assessment and plan  13.   Cognitive Assessment: Motor skills and cognition seem appropriate  In addition, today we discussed the following: Cellulitis-- s/p admission, better, finished abx  hyperlipipidemia-- good med compliance  HTN--  good medication compliance, no recent amb BPs DM--no ACBGs, trying to eat healthy   Past Medical History  Diagnosis Date  . Breast ca     surgery, chemo, XRT; had peripheral neuropathy (imbalance at times) after chemo  . Anemia   . Diabetes mellitus   . Gout   . Hyperlipidemia   . Hypertension   . Renal insufficiency   . Osteoarthritis   . GERD (gastroesophageal reflux disease)     gastritis, EGD 02/2007  . Allergic rhinitis   . Depression   . Macular degeneration 2011   Past Surgical History  Procedure Date  . Mastectomy 1999    and lymphnodes   . Lung biopsy 1999    neg  . Cataracts bilaterally 1 -2012   Family History  Problem Relation Age of Onset  . Breast cancer      ? of   . Colon cancer Neg Hx   . Heart attack Father 19  . Diabetes Sister     2   Family History  Problem Relation Age of Onset  . Breast cancer      ? of   . Colon cancer Neg Hx   . Heart attack Father 28  . Diabetes Sister     2    Social History: Married husband   w/ several medical problems 2 children, 2 gk tobacco-- never  ETOH-- rarely  diet-- healthy  exercise-- not exercising  Review of Systems  Constitutional: Negative for fever.  Respiratory: Negative for cough and shortness of breath.   Cardiovascular: Negative for chest pain and leg swelling.  Gastrointestinal: Negative for abdominal pain and blood in stool. Diarrhea: had some while on abx: resolved.  Genitourinary: Negative for dysuria and difficulty urinating.       Objective:   Physical Exam  Constitutional: She is oriented to person, place, and time. She appears well-developed and well-nourished. No distress.  HENT:  Head: Normocephalic and atraumatic.  Neck: No thyromegaly present.  Cardiovascular: Normal rate, regular rhythm and normal heart sounds.   No murmur heard. Pulmonary/Chest: Effort normal and breath sounds normal. No respiratory distress. She has no wheezes. She has no rales.  Abdominal: Soft. She exhibits no distension. There is no tenderness. There is no rebound.  Genitourinary:       R breast normal; s/p L mastectomy, chest palpation w/ post op changes, no LAD  Musculoskeletal:       No LE edema; R arm normal, L arm w/o redness , no tender, larger than the R (  back to baseline)  Neurological: She is alert and oriented to person, place, and time.  Skin: She is not diaphoretic.  Psychiatric: She has a normal mood and affect. Judgment and thought content normal.          Assessment & Plan:

## 2011-06-22 NOTE — Assessment & Plan Note (Signed)
Labs

## 2011-06-22 NOTE — Assessment & Plan Note (Addendum)
Stable per last BMP but Hg decreased lately  Labs

## 2011-06-22 NOTE — Assessment & Plan Note (Addendum)
States saw oncology 2011, was released Breast exam today ok Has a MMG due soon rec SBE

## 2011-06-22 NOTE — Assessment & Plan Note (Signed)
No change 

## 2011-06-30 NOTE — Progress Notes (Signed)
Quick Note:  Pt unavailable. Will try to call back later ______

## 2011-07-01 ENCOUNTER — Telehealth: Payer: Self-pay | Admitting: *Deleted

## 2011-07-01 ENCOUNTER — Encounter: Payer: Self-pay | Admitting: *Deleted

## 2011-07-01 DIAGNOSIS — D649 Anemia, unspecified: Secondary | ICD-10-CM

## 2011-07-01 NOTE — Telephone Encounter (Signed)
Message copied by Marylen Ponto on Thu Jul 01, 2011  5:24 PM ------      Message from: Colon Branch      Created: Sat Jun 26, 2011  2:30 PM       Advise patient to:      Cholesterol very well controlled      Continue with mild iron deficiency anemia, recommend GI referral. Please arrange

## 2011-07-01 NOTE — Telephone Encounter (Signed)
Pt aware Referral put, copy of labs mailed

## 2011-07-02 ENCOUNTER — Encounter: Payer: Self-pay | Admitting: Internal Medicine

## 2011-07-15 ENCOUNTER — Other Ambulatory Visit: Payer: Self-pay | Admitting: Internal Medicine

## 2011-07-16 ENCOUNTER — Other Ambulatory Visit: Payer: Self-pay | Admitting: Internal Medicine

## 2011-07-16 DIAGNOSIS — Z1231 Encounter for screening mammogram for malignant neoplasm of breast: Secondary | ICD-10-CM

## 2011-07-16 DIAGNOSIS — Z9012 Acquired absence of left breast and nipple: Secondary | ICD-10-CM

## 2011-07-20 ENCOUNTER — Other Ambulatory Visit: Payer: Self-pay | Admitting: Internal Medicine

## 2011-08-04 ENCOUNTER — Encounter: Payer: Self-pay | Admitting: Internal Medicine

## 2011-08-04 ENCOUNTER — Ambulatory Visit (INDEPENDENT_AMBULATORY_CARE_PROVIDER_SITE_OTHER): Payer: Medicare Other | Admitting: Internal Medicine

## 2011-08-04 DIAGNOSIS — D509 Iron deficiency anemia, unspecified: Secondary | ICD-10-CM

## 2011-08-04 DIAGNOSIS — Z1211 Encounter for screening for malignant neoplasm of colon: Secondary | ICD-10-CM

## 2011-08-04 DIAGNOSIS — K219 Gastro-esophageal reflux disease without esophagitis: Secondary | ICD-10-CM

## 2011-08-04 MED ORDER — PEG-KCL-NACL-NASULF-NA ASC-C 100 G PO SOLR: 1.0000 | Freq: Once | ORAL | Status: DC

## 2011-08-04 NOTE — Progress Notes (Signed)
HISTORY OF PRESENT ILLNESS:  Samantha Mckenzie is a 75 y.o. female with hypertension, hyperlipidemia, diabetes mellitus, breast cancer, actually degeneration, renal insufficiency, and chronic anemia. She said today regarding anemia, possibly iron deficient. The patient has had anemia for many years. In September of 2001 she underwent complete colonoscopy to evaluate anemia and a questionable history of colon polyps. Examination was normal except for mild sigmoid diverticulosis and internal hemorrhoids. She has not had colonoscopy since. She also has a history of GERD and underwent upper endoscopy in May of 2008 to evaluate reflux symptoms. She was found to have active esophagitis and gastritis. No evidence of Helicobacter pylori. She is maintained on Nexium 40 mg daily or twice daily. This controls GERD symptoms. Her only GI complaint is belching. No melena or hematochezia. Annual Hemoccult studies with her gynecologist have been negative. Review of outside records shows chronic anemia. Her hemoglobin last year was 12.2. Currently 10.4. Normal B12 and folate. Normal ferritin. Mild decrease in iron. High normal MCV. He is accompanied today by her husband.  REVIEW OF SYSTEMS:  All non-GI ROS negative except for occasional joint aches  Past Medical History  Diagnosis Date  . Breast ca     surgery, chemo, XRT; had peripheral neuropathy (imbalance at times) after chemo  . Anemia   . Diabetes mellitus   . Gout   . Hyperlipidemia   . Hypertension   . Renal insufficiency   . Osteoarthritis   . GERD (gastroesophageal reflux disease)     gastritis, EGD 02/2007  . Allergic rhinitis   . Depression   . Macular degeneration 2011    Past Surgical History  Procedure Date  . Mastectomy 1999    and lymphnodes   . Lung biopsy 1999    neg  . Cataracts bilaterally 1 -2012    Social History [name]  reports that she has never smoked. She has never used smokeless tobacco. She reports that she  drinks alcohol. She reports that she does not use illicit drugs.  family history includes Breast cancer in an unspecified family member; Diabetes in her sister; Heart attack (age of onset:86) in her father; and Hypertension in her sister.  There is no history of Colon cancer.  Allergies  Allergen Reactions  . Codeine Nausea And Vomiting  . Morphine Nausea And Vomiting  . Oxycodone-Acetaminophen Nausea And Vomiting  . Sulfonamide Derivatives Nausea And Vomiting  . Tramadol Hcl Nausea And Vomiting       PHYSICAL EXAMINATION: Vital signs: BP 118/58  Pulse 60  Ht 5' 2"  (1.575 m)  Wt 140 lb 9.6 oz (63.776 kg)  BMI 25.72 kg/m2  Constitutional: generally well-appearing, no acute distress Psychiatric: alert and oriented x3, cooperative Eyes: extraocular movements intact, anicteric, conjunctiva pink Mouth: oral pharynx moist, no lesions Neck: supple no lymphadenopathy Cardiovascular: heart regular rate and rhythm, no murmur Lungs: clear to auscultation bilaterally Abdomen: soft, nontender, nondistended, no obvious ascites, no peritoneal signs, normal bowel sounds, no organomegaly Rectal: Deferred until colonoscopy Extremities: no lower extremity edema bilaterally Skin: no lesions on visible extremities Neuro: No focal deficits.   ASSESSMENT:  #1. Chronic normocytic anemia. Not clearcut iron deficiency, though possible. #2. Last colonoscopy 2001 #3. GERD with a history of endoscopic esophagitis and gastritis on EGD in 2008 #4. Multiple medical problems   PLAN:  #1. Colonoscopy to provide followup colon cancer screening. Greater than 10 years since last colonoscopy. Also to investigate possible iron deficiency anemia.The nature of the procedure, as well  as the risks, benefits, and alternatives were carefully and thoroughly reviewed with the patient. Ample time for discussion and questions allowed. The patient understood, was satisfied, and agreed to proceed. Movi prep prescribed.  Patient instructed on its use #2. Continue PPI to control GERD symptoms #3. Would recommend a problem of iron post colonoscopy, if no obvious cause found. If she does not respond to iron therapy, then it may be reasonable for her to seek a hematology opinion. I will leave this to her primary provider, Dr. Larose Kells

## 2011-08-04 NOTE — Patient Instructions (Signed)
You have been scheduled for a colonoscopy. Please follow written instructions given to you at your visit today.  Please pick up your prep kit at the pharmacy within the next 2-3 days.  We have sent the following medications to your pharmacy for you to pick up at your convenience: Moviprep. Please use as directed.

## 2011-08-19 ENCOUNTER — Ambulatory Visit
Admission: RE | Admit: 2011-08-19 | Discharge: 2011-08-19 | Disposition: A | Discharge status: Home / Self Care | Payer: Medicare Other | Source: Ambulatory Visit | Attending: Internal Medicine | Admitting: Internal Medicine

## 2011-08-19 DIAGNOSIS — Z1231 Encounter for screening mammogram for malignant neoplasm of breast: Secondary | ICD-10-CM

## 2011-08-19 DIAGNOSIS — Z9012 Acquired absence of left breast and nipple: Secondary | ICD-10-CM

## 2011-09-13 ENCOUNTER — Encounter: Payer: Self-pay | Admitting: Internal Medicine

## 2011-09-13 ENCOUNTER — Ambulatory Visit (AMBULATORY_SURGERY_CENTER): Payer: Medicare Other | Admitting: Internal Medicine

## 2011-09-13 VITALS — BP 142/76 | HR 66 | Temp 96.5°F | Resp 18 | Ht 62.0 in | Wt 140.0 lb

## 2011-09-13 DIAGNOSIS — K5730 Diverticulosis of large intestine without perforation or abscess without bleeding: Secondary | ICD-10-CM

## 2011-09-13 DIAGNOSIS — D509 Iron deficiency anemia, unspecified: Secondary | ICD-10-CM

## 2011-09-13 DIAGNOSIS — Z1211 Encounter for screening for malignant neoplasm of colon: Secondary | ICD-10-CM

## 2011-09-13 MED ORDER — SODIUM CHLORIDE 0.9 % IV SOLN: 500.0000 mL | INTRAVENOUS | Status: DC

## 2011-09-13 NOTE — Progress Notes (Signed)
Pt denied having diabetes, therefore removed from record today

## 2011-09-13 NOTE — Progress Notes (Signed)
Patient did not experience any of the following events: a burn prior to discharge; a fall within the facility; wrong site/side/patient/procedure/implant event; or a hospital transfer or hospital admission upon discharge from the facility. (G8907) Patient did not have preoperative order for IV antibiotic SSI prophylaxis. (G8918)  

## 2011-09-13 NOTE — Patient Instructions (Signed)
Diverticulosis Trial of iron FeSO4 330m daily Please refer to blue and green discharge instructions

## 2011-09-14 ENCOUNTER — Telehealth: Payer: Self-pay

## 2011-09-14 NOTE — Telephone Encounter (Signed)

## 2011-10-19 ENCOUNTER — Other Ambulatory Visit: Payer: Self-pay | Admitting: Internal Medicine

## 2011-10-19 DIAGNOSIS — H35379 Puckering of macula, unspecified eye: Secondary | ICD-10-CM | POA: Diagnosis not present

## 2011-10-19 DIAGNOSIS — H43819 Vitreous degeneration, unspecified eye: Secondary | ICD-10-CM | POA: Diagnosis not present

## 2011-10-19 DIAGNOSIS — H40059 Ocular hypertension, unspecified eye: Secondary | ICD-10-CM | POA: Diagnosis not present

## 2011-11-02 DIAGNOSIS — H40059 Ocular hypertension, unspecified eye: Secondary | ICD-10-CM | POA: Diagnosis not present

## 2011-11-05 DIAGNOSIS — H26499 Other secondary cataract, unspecified eye: Secondary | ICD-10-CM | POA: Diagnosis not present

## 2011-11-11 ENCOUNTER — Other Ambulatory Visit: Payer: Self-pay | Admitting: Internal Medicine

## 2011-11-12 NOTE — Telephone Encounter (Signed)
Refill done.  

## 2011-12-03 ENCOUNTER — Ambulatory Visit (INDEPENDENT_AMBULATORY_CARE_PROVIDER_SITE_OTHER): Payer: Medicare Other | Admitting: Internal Medicine

## 2011-12-03 VITALS — BP 120/64 | HR 62 | Temp 98.6°F | Wt 140.0 lb

## 2011-12-03 DIAGNOSIS — E119 Type 2 diabetes mellitus without complications: Secondary | ICD-10-CM | POA: Diagnosis not present

## 2011-12-03 DIAGNOSIS — R5383 Other fatigue: Secondary | ICD-10-CM | POA: Diagnosis not present

## 2011-12-03 DIAGNOSIS — R5381 Other malaise: Secondary | ICD-10-CM | POA: Diagnosis not present

## 2011-12-03 DIAGNOSIS — D649 Anemia, unspecified: Secondary | ICD-10-CM

## 2011-12-03 DIAGNOSIS — I10 Essential (primary) hypertension: Secondary | ICD-10-CM

## 2011-12-03 LAB — BASIC METABOLIC PANEL
BUN: 39 mg/dL — ABNORMAL HIGH (ref 6–23)
CO2: 25 mEq/L (ref 19–32)
Calcium: 9.3 mg/dL (ref 8.4–10.5)
Creatinine, Ser: 1.9 mg/dL — ABNORMAL HIGH (ref 0.4–1.2)

## 2011-12-03 LAB — CBC WITH DIFFERENTIAL/PLATELET
Basophils Absolute: 0 10*3/uL (ref 0.0–0.1)
Basophils Relative: 0.2 % (ref 0.0–3.0)
HCT: 33.8 % — ABNORMAL LOW (ref 36.0–46.0)
Hemoglobin: 11.2 g/dL — ABNORMAL LOW (ref 12.0–15.0)
Lymphs Abs: 1.9 10*3/uL (ref 0.7–4.0)
MCHC: 33.1 g/dL (ref 30.0–36.0)
Monocytes Relative: 6.6 % (ref 3.0–12.0)
Neutro Abs: 3.9 10*3/uL (ref 1.4–7.7)
RBC: 3.53 Mil/uL — ABNORMAL LOW (ref 3.87–5.11)
RDW: 15.4 % — ABNORMAL HIGH (ref 11.5–14.6)

## 2011-12-03 NOTE — Assessment & Plan Note (Signed)
On diet control only, labs

## 2011-12-03 NOTE — Assessment & Plan Note (Signed)
Mild fatigue x 1 week only, ROS neg Recommend observation Checking for anemia

## 2011-12-03 NOTE — Assessment & Plan Note (Signed)
Due for labs. Has c/o fatigue today but BP  doesn't seem to be over controlled

## 2011-12-03 NOTE — Progress Notes (Signed)
  Subjective:    Patient ID: Samantha Mckenzie, female    DOB: [birth year], 76 y.o.   MRN: [id number]  HPI Routine office visit Complaining of hair loss (global) for the last 2 months. Scalp slightly itchy. Also complains of fatigue , only going on for the last week Anemia, chart reviewed, normal colonoscopy December 2012. Hypertension, good medication compliance, ambulatory blood pressures are 120/80.  Past Medical History  Diagnosis Date  . Breast CA     surgery, chemo, XRT; had peripheral neuropathy (imbalance at times) after chemo  . Anemia   . Gout   . Hyperlipidemia   . Hypertension   . Renal insufficiency   . Osteoarthritis   . GERD (gastroesophageal reflux disease)     gastritis, EGD 02/2007  . Allergic rhinitis   . Depression   . Macular degeneration 2011   Past Surgical History  Procedure Date  . Mastectomy 1999    and lymphnodes   . Lung biopsy 1999    neg  . Cataracts bilaterally 1 -2012     Review of Systems Denies anxiety or depression, symptoms well-controlled No lower extremity edema, chest pain or shortness of breath Denies fever, chills, aches or URI type of symptoms.     Objective:   Physical Exam  Constitutional: She is oriented to person, place, and time. She appears well-developed and well-nourished. No distress.  HENT:  Head: Normocephalic and atraumatic.  Cardiovascular: Normal rate, regular rhythm and normal heart sounds.   No murmur heard. Pulmonary/Chest: Effort normal and breath sounds normal. No respiratory distress. She has no wheezes. She has no rales.  Musculoskeletal: She exhibits no edema.  Neurological: She is alert and oriented to person, place, and time.  Skin: She is not diaphoretic.       Scalp normal, hair thin but healthy and seems appropriate for age      Assessment & Plan:  Hair loss-- rec observation

## 2011-12-03 NOTE — Assessment & Plan Note (Addendum)
Cscope 12-12 neg, per GI notes they rec iron po and consider hematology eval Plan: labs , she is not on iron supplement thus if still anemia I'll recommend oral iron and cont MVIs-B12 supplements

## 2011-12-07 MED ORDER — FERROUS SULFATE 325 (65 FE) MG PO TABS: 325.0000 mg | ORAL_TABLET | Freq: Two times a day (BID) | ORAL | Status: DC

## 2011-12-07 NOTE — Progress Notes (Signed)
Addended by: Douglass Rivers T on: 12/07/2011 05:05 PM   Modules accepted: Orders

## 2011-12-08 ENCOUNTER — Ambulatory Visit (INDEPENDENT_AMBULATORY_CARE_PROVIDER_SITE_OTHER): Payer: Medicare Other | Admitting: Internal Medicine

## 2011-12-08 VITALS — BP 146/72 | HR 65 | Temp 99.8°F | Wt 142.0 lb

## 2011-12-08 DIAGNOSIS — Z23 Encounter for immunization: Secondary | ICD-10-CM

## 2011-12-08 DIAGNOSIS — L0390 Cellulitis, unspecified: Secondary | ICD-10-CM

## 2011-12-08 DIAGNOSIS — L0291 Cutaneous abscess, unspecified: Secondary | ICD-10-CM

## 2011-12-08 DIAGNOSIS — IMO0002 Reserved for concepts with insufficient information to code with codable children: Secondary | ICD-10-CM

## 2011-12-08 HISTORY — DX: Reserved for concepts with insufficient information to code with codable children: IMO0002

## 2011-12-08 MED ORDER — CEFTRIAXONE SODIUM 1 G IJ SOLR
1.0000 g | Freq: Once | INTRAMUSCULAR | Status: AC
  Administered 2011-12-08: 1 g via INTRAMUSCULAR

## 2011-12-08 MED ORDER — DOXYCYCLINE HYCLATE 100 MG PO TABS: 100.0000 mg | ORAL_TABLET | Freq: Two times a day (BID) | ORAL | Status: DC

## 2011-12-08 MED ORDER — DEXTROSE 5 % IV SOLN: 1.0000 g | INTRAVENOUS | Status: DC

## 2011-12-08 NOTE — Progress Notes (Signed)
  Subjective:    Patient ID: Samantha Mckenzie, female    DOB: 12-15-1935, 76 y.o.   MRN: [id number]  HPI Acute visit Patient noted redness, swelling and tenderness on the left arm yesterday, she has a history of cellulitis in that arm and she knew what it was.  Past Medical History  Diagnosis Date  . Breast CA     surgery, chemo, XRT; had peripheral neuropathy (imbalance at times) after chemo  . Anemia   . Gout   . Hyperlipidemia   . Hypertension   . Renal insufficiency   . Osteoarthritis   . GERD (gastroesophageal reflux disease)     gastritis, EGD 02/2007  . Allergic rhinitis   . Depression   . Macular degeneration 2011   Past Surgical History  Procedure Date  . Mastectomy 1999    and lymphnodes   . Lung biopsy 1999    neg  . Cataracts bilaterally 1 -2012     Review of Systems Denies any fever, chills although her temperature today is 99.8. No nausea, vomiting. Other than the mild fatigue that she has for a while, she feels well.    Objective:   Physical Exam  Musculoskeletal:       Arms:   Alert, oriented x3, no apparent distress. Lungs are clear to auscultation bilaterally Cardiovascular regular rate and rhythm without a murmur. Right upper extremity normal Left upper extremity swelling, red, warm, slightly tender. At the biceps area the arm is 3 inches larger than the right side. At the forearm is only 2 inches. See graphic. No open sores, fluctuance or anything to incise and drain       Assessment & Plan:  Today , I spent more than 25 min with the patient, >50% of the time counseling, see a/p

## 2011-12-08 NOTE — Assessment & Plan Note (Addendum)
Patient presents with acute cellulitis of the left arm , has a history of left breast cancer. This is his fourth episode of cellulitis since the breast surgery, this has been treated successfully as an outpatient at least one time however last year, she presented with arm cellulitis and she was hypotensive and ended up being admitted to the hospital. I am concerned about her and I am recommending an admission to the hospital, the patient is extremely reluctant to do that, I told her that I am willing to work with her understanding that there is a chance that she may get a little worse. She said she understand that risk. (Fortunately, besides the cellulitis she looks well, vital signs are stable.) With this in mind I recommend: Doxycycline, Rocephin 1 g IM, blood cultures, reassess in the morning, ER if symptoms increase, see instructions. Again patient seems to understand my concerns.

## 2011-12-08 NOTE — Patient Instructions (Signed)
Keep your arm elevated Take doxycycline for 10 days Please call 911 or go to the ER if you have high fever, the swelling/redness/pain gets worse or if you have nausea, vomiting or feeling faint. Please come back tomorrow for a recheck.

## 2011-12-09 ENCOUNTER — Ambulatory Visit (INDEPENDENT_AMBULATORY_CARE_PROVIDER_SITE_OTHER): Payer: Medicare Other | Admitting: Internal Medicine

## 2011-12-09 ENCOUNTER — Encounter: Payer: Self-pay | Admitting: Internal Medicine

## 2011-12-09 ENCOUNTER — Other Ambulatory Visit: Payer: Self-pay | Admitting: Internal Medicine

## 2011-12-09 VITALS — BP 118/64 | HR 58 | Temp 97.8°F | Wt 140.0 lb

## 2011-12-09 DIAGNOSIS — L0390 Cellulitis, unspecified: Secondary | ICD-10-CM

## 2011-12-09 MED ORDER — CEFTRIAXONE SODIUM 1 G IJ SOLR
1.0000 g | Freq: Once | INTRAMUSCULAR | Status: AC
  Administered 2011-12-09: 1 g via INTRAMUSCULAR

## 2011-12-09 NOTE — Assessment & Plan Note (Signed)
Cellulitis: Improving,  Plan: Rocephin today. Continue with by mouth antibiotics Followup in 2 weeks I again discussed the patient the importance of calling immediately if she's not improving or if she initially improves and then symptoms resurface. She verbalized understanding

## 2011-12-09 NOTE — Progress Notes (Signed)
  Subjective:    Patient ID: Samantha Mckenzie, female    DOB: 1935-11-21, 76 y.o.   MRN: [id number]  HPI  Followup from yesterday. Good compliance with antibiotics. Arm seems  better to her.  Past Medical History  Diagnosis Date  . Breast CA     surgery, chemo, XRT; had peripheral neuropathy (imbalance at times) after chemo  . Anemia   . Gout   . Hyperlipidemia   . Hypertension   . Renal insufficiency   . Osteoarthritis   . GERD (gastroesophageal reflux disease)     gastritis, EGD 02/2007  . Allergic rhinitis   . Depression   . Macular degeneration 2011    Review of Systems No fever or chills. Tolerates by mouth antibiotics will without nausea, vomiting or diarrhea    Objective:   Physical Exam  Alert, oriented x3, afebrile. Cellulitis looks less red. On the proximal arm, the redness has actually decreased. I measured the  bicipital  and the left side is only 2 inches larger than the right. The forearm is still 3 inches larger.       Assessment & Plan:

## 2011-12-10 NOTE — Telephone Encounter (Signed)
Refill done.  

## 2011-12-14 LAB — CULTURE, BLOOD (SINGLE): Organism ID, Bacteria: NO GROWTH

## 2011-12-23 ENCOUNTER — Other Ambulatory Visit: Payer: Self-pay | Admitting: Internal Medicine

## 2011-12-23 ENCOUNTER — Ambulatory Visit (INDEPENDENT_AMBULATORY_CARE_PROVIDER_SITE_OTHER): Payer: Medicare Other | Admitting: Internal Medicine

## 2011-12-23 VITALS — BP 122/64 | HR 53 | Temp 98.2°F | Wt 139.0 lb

## 2011-12-23 DIAGNOSIS — L0390 Cellulitis, unspecified: Secondary | ICD-10-CM | POA: Diagnosis not present

## 2011-12-23 DIAGNOSIS — L0291 Cutaneous abscess, unspecified: Secondary | ICD-10-CM

## 2011-12-23 MED ORDER — DOXYCYCLINE HYCLATE 100 MG PO TABS: 100.0000 mg | ORAL_TABLET | Freq: Two times a day (BID) | ORAL | Status: AC

## 2011-12-23 NOTE — Telephone Encounter (Signed)
Refill done.  

## 2011-12-23 NOTE — Progress Notes (Signed)
  Subjective:    Patient ID: Samantha Mckenzie, female    DOB: Sep 30, 1936, 76 y.o.   MRN: [id number]  HPI Followup for cellulitis She finished doxycycline 2 days ago, tolerated well except for a very mild diarrhea  Past Medical History  Diagnosis Date  . Breast CA     surgery, chemo, XRT; had peripheral neuropathy (imbalance at times) after chemo  . Anemia   . Gout   . Hyperlipidemia   . Hypertension   . Renal insufficiency   . Osteoarthritis   . GERD (gastroesophageal reflux disease)     gastritis, EGD 02/2007  . Allergic rhinitis   . Depression   . Macular degeneration 2011      Review of Systems No fever chills, thinks that her arms are almost  back to normal.    Objective:   Physical Exam  Alert oriented x3 Right arm normal Left arm, continue to be slightly larger than the right, patient reports it is back to baseline however when I palpated it seemed to be slightly warm. It is 1 & 3/4 inches larger than the R (forearm)      Assessment & Plan:

## 2011-12-24 ENCOUNTER — Encounter: Payer: Self-pay | Admitting: Internal Medicine

## 2011-12-24 NOTE — Assessment & Plan Note (Signed)
Cellulitis, patient reports the arm is back to baseline however arm is still slightly warm. I recommend doxycycline for 5 additional days , no need to followup if any improvement continue.

## 2012-01-04 DIAGNOSIS — H35379 Puckering of macula, unspecified eye: Secondary | ICD-10-CM | POA: Diagnosis not present

## 2012-01-04 DIAGNOSIS — H40059 Ocular hypertension, unspecified eye: Secondary | ICD-10-CM | POA: Diagnosis not present

## 2012-01-04 DIAGNOSIS — H43819 Vitreous degeneration, unspecified eye: Secondary | ICD-10-CM | POA: Diagnosis not present

## 2012-01-10 ENCOUNTER — Other Ambulatory Visit: Payer: Self-pay | Admitting: Internal Medicine

## 2012-01-10 NOTE — Telephone Encounter (Signed)
Refill done.  

## 2012-01-18 ENCOUNTER — Other Ambulatory Visit: Payer: Self-pay | Admitting: Internal Medicine

## 2012-01-18 NOTE — Telephone Encounter (Signed)
Refill done.  

## 2012-02-11 DIAGNOSIS — L259 Unspecified contact dermatitis, unspecified cause: Secondary | ICD-10-CM | POA: Diagnosis not present

## 2012-02-17 ENCOUNTER — Other Ambulatory Visit: Payer: Self-pay | Admitting: Internal Medicine

## 2012-02-24 ENCOUNTER — Telehealth: Payer: Self-pay | Admitting: Internal Medicine

## 2012-02-24 MED ORDER — ESCITALOPRAM OXALATE 10 MG PO TABS: 10.0000 mg | ORAL_TABLET | Freq: Every day | ORAL | Status: DC

## 2012-02-24 MED ORDER — ALLOPURINOL 100 MG PO TABS: 100.0000 mg | ORAL_TABLET | Freq: Every day | ORAL | Status: DC

## 2012-02-24 MED ORDER — ATORVASTATIN CALCIUM 20 MG PO TABS: 20.0000 mg | ORAL_TABLET | Freq: Every day | ORAL | Status: DC

## 2012-02-24 NOTE — Telephone Encounter (Signed)
Refills x 3 - [facility] to [facility] fax [phone]  1-Escitalopram 10MG tablet Requesting 90 day supply wt/4-refills Last written 4.9.13, qty 30, take on tablet by mouth daily , with 2-refills  2-Allopurinol 100 MG Tablet Requesting 90 day supply Last written 3.14.13, qty 90,wt/3-refills take one tablet by mouth once daily (NOTED DUE FOR Office Visit)  3-Atorvastatin 20MG tablet Requesting 90 days Last written 4.1.13, qty 90 wt/3-refills, Take as directed  Last OV 3.14.13

## 2012-02-24 NOTE — Telephone Encounter (Signed)
Refill done.  

## 2012-02-25 ENCOUNTER — Other Ambulatory Visit: Payer: Self-pay

## 2012-02-25 ENCOUNTER — Other Ambulatory Visit: Payer: Self-pay | Admitting: Internal Medicine

## 2012-02-25 MED ORDER — METOPROLOL TARTRATE 50 MG PO TABS: 50.0000 mg | ORAL_TABLET | Freq: Every day | ORAL | Status: DC

## 2012-02-25 MED ORDER — AMLODIPINE BESYLATE 5 MG PO TABS: 5.0000 mg | ORAL_TABLET | Freq: Every day | ORAL | Status: DC

## 2012-02-25 MED ORDER — ESOMEPRAZOLE MAGNESIUM 40 MG PO CPDR: 40.0000 mg | DELAYED_RELEASE_CAPSULE | Freq: Every day | ORAL | Status: DC

## 2012-02-25 NOTE — Telephone Encounter (Signed)
Refills x 2 Amlodipine Besylate 83m tab Qty 90 Last written 1.31.13, qty 90 TAKE 1 TABLET BY MOUTH EVERY DAY  metroprolol tartrate 593mtab Qty 90 Last written 2.28.13, qty 90 TAKE 1 TABLET BY MOUTH ONCE DAILY  Last OV 03.14.13

## 2012-02-25 NOTE — Telephone Encounter (Signed)
Refill done.  

## 2012-02-25 NOTE — Telephone Encounter (Signed)
request for refill of nexium; rx refilled

## 2012-03-01 ENCOUNTER — Ambulatory Visit (INDEPENDENT_AMBULATORY_CARE_PROVIDER_SITE_OTHER): Payer: Medicare Other | Admitting: Internal Medicine

## 2012-03-01 ENCOUNTER — Ambulatory Visit (INDEPENDENT_AMBULATORY_CARE_PROVIDER_SITE_OTHER)
Admission: RE | Admit: 2012-03-01 | Discharge: 2012-03-01 | Disposition: A | Discharge status: Home / Self Care | Payer: Medicare Other | Source: Ambulatory Visit | Attending: Internal Medicine | Admitting: Internal Medicine

## 2012-03-01 ENCOUNTER — Telehealth: Payer: Self-pay | Admitting: Internal Medicine

## 2012-03-01 VITALS — BP 128/68 | HR 58 | Temp 98.4°F | Wt 138.0 lb

## 2012-03-01 DIAGNOSIS — M542 Cervicalgia: Secondary | ICD-10-CM

## 2012-03-01 DIAGNOSIS — M5030 Other cervical disc degeneration, unspecified cervical region: Secondary | ICD-10-CM | POA: Diagnosis not present

## 2012-03-01 DIAGNOSIS — M1990 Unspecified osteoarthritis, unspecified site: Secondary | ICD-10-CM | POA: Diagnosis not present

## 2012-03-01 DIAGNOSIS — I10 Essential (primary) hypertension: Secondary | ICD-10-CM | POA: Diagnosis not present

## 2012-03-01 DIAGNOSIS — M47812 Spondylosis without myelopathy or radiculopathy, cervical region: Secondary | ICD-10-CM | POA: Diagnosis not present

## 2012-03-01 DIAGNOSIS — D649 Anemia, unspecified: Secondary | ICD-10-CM

## 2012-03-01 DIAGNOSIS — E119 Type 2 diabetes mellitus without complications: Secondary | ICD-10-CM

## 2012-03-01 LAB — HEMOGLOBIN A1C: Hgb A1c MFr Bld: 6.5 % (ref 4.6–6.5)

## 2012-03-01 LAB — CBC WITH DIFFERENTIAL/PLATELET
Basophils Absolute: 0 10*3/uL (ref 0.0–0.1)
Basophils Relative: 0.5 % (ref 0.0–3.0)
Eosinophils Absolute: 0.1 10*3/uL (ref 0.0–0.7)
HCT: 34.6 % — ABNORMAL LOW (ref 36.0–46.0)
Hemoglobin: 11.2 g/dL — ABNORMAL LOW (ref 12.0–15.0)
Lymphs Abs: 1.9 10*3/uL (ref 0.7–4.0)
MCHC: 32.4 g/dL (ref 30.0–36.0)
MCV: 97.5 fl (ref 78.0–100.0)
Monocytes Absolute: 0.5 10*3/uL (ref 0.1–1.0)
Neutro Abs: 5.5 10*3/uL (ref 1.4–7.7)
RBC: 3.55 Mil/uL — ABNORMAL LOW (ref 3.87–5.11)
RDW: 15.9 % — ABNORMAL HIGH (ref 11.5–14.6)

## 2012-03-01 LAB — BASIC METABOLIC PANEL
BUN: 54 mg/dL — ABNORMAL HIGH (ref 6–23)
Calcium: 10.1 mg/dL (ref 8.4–10.5)
Creatinine, Ser: 2.2 mg/dL — ABNORMAL HIGH (ref 0.4–1.2)

## 2012-03-01 NOTE — Assessment & Plan Note (Signed)
Well-controlled,  check a BMP

## 2012-03-01 NOTE — Assessment & Plan Note (Deleted)
Patient with a history of ulcer try his presents with a new problem, neck pain for a few weeks. Pain is without radiation, DTRs symmetric 6 Plan: X-ray Avoid Motrin or to light medicines do to renal insufficiency Continue with Tylenol, warm compress If not better consider PT referral or Vicodin.

## 2012-03-01 NOTE — Patient Instructions (Signed)
Please get your x-ray at the other [facility]  office located at: [place], across from [facility].  Please go to the basement, this is a walk-in facility, they are open from 8:30 to 5:30 PM. Phone number [phone]. ------ Warm compress 3 times a day to the neck Tylenol 500 mg 2 tablets every 6 hours as needed Avoid Motrin or Motrin-like medicines. Call if the pain is not better.

## 2012-03-01 NOTE — Assessment & Plan Note (Addendum)
History of anemia, saw GI -- 2012, had a colonoscopy 09/2011, essentially negative. GI recommended iron supplementation and consider a hematology referral Plan: check a CBC and iron panel.

## 2012-03-01 NOTE — Telephone Encounter (Signed)
Patient 's spouse calling office, states someone from our office called his phone, let it ring twice, then hung up.  He believes it may have been about patient's xray results.  Please return his call with results.

## 2012-03-01 NOTE — Assessment & Plan Note (Signed)
On diet control only, last A1c 6.9, labs

## 2012-03-01 NOTE — Telephone Encounter (Signed)
Discussed with pt

## 2012-03-01 NOTE — Progress Notes (Signed)
  Subjective:    Patient ID: Samantha Mckenzie, female    DOB: 09-27-1936, 76 y.o.   MRN: [id number]  HPI Acute visit Reports neck pain for the last few weeks, located in the back of the neck, no radiation, no recent injury. No history of neck surgery. Symptoms worse with moving her neck. Tylenol help some. Also thinks she has a cerumen in the left ear  Past Medical History  Diagnosis Date  . Breast CA     surgery, chemo, XRT; had peripheral neuropathy (imbalance at times) after chemo  . Anemia   . Gout   . Hyperlipidemia   . Hypertension   . Renal insufficiency   . Osteoarthritis   . GERD (gastroesophageal reflux disease)     gastritis, EGD 02/2007  . Allergic rhinitis   . Depression   . Macular degeneration 2011    Review of Systems Denies any upper or lower extremity paresthesias. She is here with a acute problem however she hasn't been seen for a routine visit consequently we reviewed her medications--->  good compliance, we'll also order appropriate labs to followup chronic medical problems.    Objective:   Physical Exam  Constitutional: She appears well-developed and well-nourished.  HENT:  Head: Normocephalic and atraumatic.  Neck:         Range of motion Limited by pain, particularly by turning her head to the left or the right  Musculoskeletal: She exhibits no edema.  Neurological:       Gait normal, DTRs and motor strength symmetric  Psychiatric: She has a normal mood and affect. Her behavior is normal. Judgment and thought content normal.       Assessment & Plan:

## 2012-03-01 NOTE — Assessment & Plan Note (Signed)
Patient complained about neck pain last year, problems was silent  until the last few weeks. Pain is without radiation, DTRs symmetric  Plan: X-ray Avoid Motrin-nsaids d/t renal insufficiency Continue with Tylenol, warm compress If not better consider PT referral or Vicodin.

## 2012-03-02 ENCOUNTER — Encounter: Payer: Self-pay | Admitting: Internal Medicine

## 2012-03-17 DIAGNOSIS — I129 Hypertensive chronic kidney disease with stage 1 through stage 4 chronic kidney disease, or unspecified chronic kidney disease: Secondary | ICD-10-CM | POA: Diagnosis not present

## 2012-03-17 DIAGNOSIS — E119 Type 2 diabetes mellitus without complications: Secondary | ICD-10-CM | POA: Diagnosis not present

## 2012-03-17 DIAGNOSIS — D649 Anemia, unspecified: Secondary | ICD-10-CM | POA: Diagnosis not present

## 2012-03-17 DIAGNOSIS — N184 Chronic kidney disease, stage 4 (severe): Secondary | ICD-10-CM | POA: Diagnosis not present

## 2012-03-26 ENCOUNTER — Telehealth: Payer: Self-pay | Admitting: Internal Medicine

## 2012-03-26 NOTE — Telephone Encounter (Signed)
Her potassium was elevated, needs a nurse visit this weeks for a BP check , labs (BMP --- dx hypertension) and to be sure she cut Diovan in 1/2 dose

## 2012-03-28 NOTE — Telephone Encounter (Signed)
Done coming in 6.19.13 @ 830 - labs, then to see nurse for bp

## 2012-03-28 NOTE — Telephone Encounter (Signed)
Lm w spouse for pt to call the office.

## 2012-03-29 ENCOUNTER — Encounter: Payer: Self-pay | Admitting: *Deleted

## 2012-03-29 ENCOUNTER — Encounter (INDEPENDENT_AMBULATORY_CARE_PROVIDER_SITE_OTHER): Payer: Medicare Other | Admitting: Ophthalmology

## 2012-03-29 ENCOUNTER — Ambulatory Visit: Payer: Medicare Other | Admitting: *Deleted

## 2012-03-29 ENCOUNTER — Other Ambulatory Visit (INDEPENDENT_AMBULATORY_CARE_PROVIDER_SITE_OTHER): Payer: Medicare Other

## 2012-03-29 VITALS — BP 114/68

## 2012-03-29 DIAGNOSIS — H43819 Vitreous degeneration, unspecified eye: Secondary | ICD-10-CM | POA: Diagnosis not present

## 2012-03-29 DIAGNOSIS — H35379 Puckering of macula, unspecified eye: Secondary | ICD-10-CM

## 2012-03-29 DIAGNOSIS — H26499 Other secondary cataract, unspecified eye: Secondary | ICD-10-CM | POA: Diagnosis not present

## 2012-03-29 DIAGNOSIS — H35039 Hypertensive retinopathy, unspecified eye: Secondary | ICD-10-CM

## 2012-03-29 DIAGNOSIS — I10 Essential (primary) hypertension: Secondary | ICD-10-CM

## 2012-03-29 LAB — BASIC METABOLIC PANEL
BUN: 38 mg/dL — ABNORMAL HIGH (ref 6–23)
CO2: 25 mEq/L (ref 19–32)
GFR: 25.77 mL/min — ABNORMAL LOW (ref >60.00)
Glucose, Bld: 145 mg/dL — ABNORMAL HIGH (ref 70–99)
Potassium: 4.6 mEq/L (ref 3.5–5.1)
Sodium: 137 mEq/L (ref 135–145)

## 2012-04-12 ENCOUNTER — Ambulatory Visit (INDEPENDENT_AMBULATORY_CARE_PROVIDER_SITE_OTHER): Payer: Medicare Other | Admitting: Ophthalmology

## 2012-04-12 DIAGNOSIS — H2700 Aphakia, unspecified eye: Secondary | ICD-10-CM

## 2012-04-12 DIAGNOSIS — H35039 Hypertensive retinopathy, unspecified eye: Secondary | ICD-10-CM

## 2012-05-16 DIAGNOSIS — H35379 Puckering of macula, unspecified eye: Secondary | ICD-10-CM | POA: Diagnosis not present

## 2012-05-16 DIAGNOSIS — H43819 Vitreous degeneration, unspecified eye: Secondary | ICD-10-CM | POA: Diagnosis not present

## 2012-06-21 ENCOUNTER — Ambulatory Visit (INDEPENDENT_AMBULATORY_CARE_PROVIDER_SITE_OTHER): Payer: Medicare Other | Admitting: Internal Medicine

## 2012-06-21 ENCOUNTER — Encounter: Payer: Self-pay | Admitting: Internal Medicine

## 2012-06-21 VITALS — BP 142/70 | HR 63 | Temp 98.2°F | Wt 142.0 lb

## 2012-06-21 DIAGNOSIS — F329 Major depressive disorder, single episode, unspecified: Secondary | ICD-10-CM

## 2012-06-21 DIAGNOSIS — E119 Type 2 diabetes mellitus without complications: Secondary | ICD-10-CM | POA: Diagnosis not present

## 2012-06-21 DIAGNOSIS — I10 Essential (primary) hypertension: Secondary | ICD-10-CM

## 2012-06-21 DIAGNOSIS — M79609 Pain in unspecified limb: Secondary | ICD-10-CM

## 2012-06-21 DIAGNOSIS — N259 Disorder resulting from impaired renal tubular function, unspecified: Secondary | ICD-10-CM | POA: Diagnosis not present

## 2012-06-21 DIAGNOSIS — M79606 Pain in leg, unspecified: Secondary | ICD-10-CM | POA: Insufficient documentation

## 2012-06-21 MED ORDER — GABAPENTIN 100 MG PO CAPS: 100.0000 mg | ORAL_CAPSULE | Freq: Every evening | ORAL | Status: DC | PRN

## 2012-06-21 NOTE — Assessment & Plan Note (Signed)
On no medication, check a A1c

## 2012-06-21 NOTE — Assessment & Plan Note (Signed)
Good compliance of medication, BP 142/70 today. No change for now.

## 2012-06-21 NOTE — Patient Instructions (Addendum)
Gabapentin 1 or 2 tablets at night and see if that helps with the discomfort in your legs

## 2012-06-21 NOTE — Assessment & Plan Note (Signed)
Self discontinue Lexapro, does not feel depressed at this point.

## 2012-06-21 NOTE — Progress Notes (Signed)
Subjective:    Patient ID: Samantha Mckenzie, female    DOB: [birth year], 76 y.o.   MRN: [id number]  HPI Acute visit for leg pain Several weeks history of ill-defined  lower extremity symptoms: Hurting  at the lateral aspect of both legs on and off, mostly when walking, no calf pain with exertion. Additionally, mostly at night her feet feel hot and dry and she has ill-defined discomfort throughout the legs, better when she move legs?.  We also checked her for her chronic medical problems High cholesterol good medication compliance Hypertension, good compliance with medicines, normal ambulatory blood pressures History of depression, few months ago we prescribed Lexapro, she took it for a while but then discontinued it.  States that at this point she does not feel depressed. Did notice that Lexapro helped her sleep better , at the beginning of the treatment.  Past Medical History: h/o breast cancer--- surgery,chemo, XRT; had peripheral neuropathy (imbalance at times) after chemo Diabetes mellitus, type II Gout Hyperlipidemia Hypertension Renal insufficiency Osteoarthritis H/o anemia  GERD EGD 02-2007-- gastritis GERD Carotid u/s Nl 11-07 (had a carotid bruit) Allergic rhinitis   Past Surgical History: Mastectomy AND [id number](5170) open Lung Bx (1999) ----> neg   Social History:  Married  husband w/ several medical problems  2 children, 2 gk  tobacco-- never  ETOH-- rarely  diet-- healthy  exercise-- not exercising   Review of Systems Denies fever chills. No rash anywhere in the lower extremities. No bladder or bowel incontinence. Occasionally has neck and back pain but not severe No  nausea, vomiting, diarrhea.  Current Outpatient Rx  Name Route Sig Dispense Refill  . ALLOPURINOL 100 MG PO TABS Oral Take 1 tablet (100 mg total) by mouth daily. 90 tablet 3  . AMLODIPINE BESYLATE 5 MG PO TABS Oral Take 1 tablet (5 mg total) by mouth daily. 90 tablet 3  . ASPIRIN 81 MG  PO TABS Oral Take 81 mg by mouth daily.      . ATORVASTATIN CALCIUM 20 MG PO TABS Oral Take 1 tablet (20 mg total) by mouth daily. 90 tablet 3  . BRIMONIDINE TARTRATE-TIMOLOL 0.2-0.5 % OP SOLN Both Eyes Place 1 drop into both eyes every 12 (twelve) hours.    Marland Kitchen VITAMIN D3 1000 UNITS PO CAPS Oral Take 1 each by mouth daily.      Marland Kitchen ESOMEPRAZOLE MAGNESIUM 40 MG PO CPDR Oral Take 1 capsule (40 mg total) by mouth daily before breakfast. 180 capsule 3  . METOPROLOL TARTRATE 50 MG PO TABS Oral Take 1 tablet (50 mg total) by mouth daily. 90 tablet 3  . CENTRUM SILVER PO Oral Take 1 each by mouth daily.      Marland Kitchen VALSARTAN 320 MG PO TABS Oral Take 160 mg by mouth daily.     Marland Kitchen VITAMIN B-12 1000 MCG PO TABS Oral Take 1,000 mcg by mouth daily.      Marland Kitchen FERROUS SULFATE 325 (65 FE) MG PO TABS Oral Take 1 tablet (325 mg total) by mouth 2 (two) times daily. 60 tablet 3  . FEXOFENADINE HCL 60 MG PO TABS Oral Take 60 mg by mouth daily.      Marland Kitchen GABAPENTIN 100 MG PO CAPS Oral Take 1-2 capsules (100-200 mg total) by mouth at bedtime as needed. 60 capsule 3       Objective:   Physical Exam General -- alert, well-developed Lungs -- normal respiratory effort, no intercostal retractions, no accessory muscle use, and normal breath sounds.  Heart-- normal rate, regular rhythm, no murmur, and no gallop.   Abdomen--soft, non-tender   Extremities-- no pretibial edema bilaterally , normal femoral and pedal pulses. Hips and knees with normal rotation without pain. Knees do have some changes consistent with DJD but no effusion. DIABETIC FEET EXAM: Skin and nails are normal without calluses Pinprick examination of the feet normal. Neurologic--  strength normal in all extremities. Gait normal Psych-- Cognition and judgment appear intact. Alert and cooperative with normal attention span and concentration.  not anxious appearing and not depressed appearing.       Assessment & Plan:

## 2012-06-21 NOTE — Assessment & Plan Note (Addendum)
Saw nephrology 03/2012, she was stable. Pt request a BMP

## 2012-06-21 NOTE — Assessment & Plan Note (Addendum)
Chief complaint today is leg discomfort, symptoms could be RLS versus diabetic neuropathy. She has a history of B12 deficiency but B12s have been consistently normal. Plan: Trial with Neurontin , taking in consideration renal function she should not be taking more than 300 daily. Will start with 100 mg tablet 1 or 2 at bedtime.

## 2012-06-22 LAB — BASIC METABOLIC PANEL
BUN: 51 mg/dL — ABNORMAL HIGH (ref 6–23)
Chloride: 104 mEq/L (ref 96–112)
Creatinine, Ser: 2 mg/dL — ABNORMAL HIGH (ref 0.4–1.2)
Glucose, Bld: 103 mg/dL — ABNORMAL HIGH (ref 70–99)
Potassium: 4.9 mEq/L (ref 3.5–5.1)

## 2012-06-26 ENCOUNTER — Encounter: Payer: Self-pay | Admitting: *Deleted

## 2012-07-11 ENCOUNTER — Other Ambulatory Visit: Payer: Self-pay | Admitting: Internal Medicine

## 2012-07-11 DIAGNOSIS — Z9012 Acquired absence of left breast and nipple: Secondary | ICD-10-CM

## 2012-07-11 DIAGNOSIS — Z1231 Encounter for screening mammogram for malignant neoplasm of breast: Secondary | ICD-10-CM

## 2012-07-19 ENCOUNTER — Encounter (HOSPITAL_COMMUNITY): Payer: Self-pay | Admitting: Emergency Medicine

## 2012-07-19 ENCOUNTER — Emergency Department (HOSPITAL_COMMUNITY)
Admission: EM | Admit: 2012-07-19 | Discharge: 2012-07-20 | Disposition: A | Discharge status: Home / Self Care | Payer: Medicare Other | Attending: Emergency Medicine | Admitting: Emergency Medicine

## 2012-07-19 ENCOUNTER — Ambulatory Visit (INDEPENDENT_AMBULATORY_CARE_PROVIDER_SITE_OTHER): Payer: Medicare Other | Admitting: Internal Medicine

## 2012-07-19 ENCOUNTER — Encounter: Payer: Self-pay | Admitting: Internal Medicine

## 2012-07-19 VITALS — BP 118/62 | HR 48 | Temp 97.5°F | Wt 142.0 lb

## 2012-07-19 DIAGNOSIS — F329 Major depressive disorder, single episode, unspecified: Secondary | ICD-10-CM | POA: Insufficient documentation

## 2012-07-19 DIAGNOSIS — R112 Nausea with vomiting, unspecified: Secondary | ICD-10-CM | POA: Insufficient documentation

## 2012-07-19 DIAGNOSIS — R404 Transient alteration of awareness: Secondary | ICD-10-CM | POA: Diagnosis not present

## 2012-07-19 DIAGNOSIS — Z79899 Other long term (current) drug therapy: Secondary | ICD-10-CM | POA: Diagnosis not present

## 2012-07-19 DIAGNOSIS — R197 Diarrhea, unspecified: Secondary | ICD-10-CM | POA: Insufficient documentation

## 2012-07-19 DIAGNOSIS — K219 Gastro-esophageal reflux disease without esophagitis: Secondary | ICD-10-CM | POA: Insufficient documentation

## 2012-07-19 DIAGNOSIS — R5381 Other malaise: Secondary | ICD-10-CM | POA: Insufficient documentation

## 2012-07-19 DIAGNOSIS — I10 Essential (primary) hypertension: Secondary | ICD-10-CM | POA: Diagnosis not present

## 2012-07-19 DIAGNOSIS — R110 Nausea: Secondary | ICD-10-CM | POA: Diagnosis not present

## 2012-07-19 DIAGNOSIS — E785 Hyperlipidemia, unspecified: Secondary | ICD-10-CM | POA: Insufficient documentation

## 2012-07-19 DIAGNOSIS — F3289 Other specified depressive episodes: Secondary | ICD-10-CM | POA: Insufficient documentation

## 2012-07-19 DIAGNOSIS — I959 Hypotension, unspecified: Secondary | ICD-10-CM | POA: Diagnosis not present

## 2012-07-19 LAB — COMPREHENSIVE METABOLIC PANEL
Albumin: 3.2 g/dL — ABNORMAL LOW (ref 3.5–5.2)
BUN: 47 mg/dL — ABNORMAL HIGH (ref 6–23)
Calcium: 8.8 mg/dL (ref 8.4–10.5)
Creatinine, Ser: 1.91 mg/dL — ABNORMAL HIGH (ref 0.50–1.10)
Potassium: 4.9 mEq/L (ref 3.5–5.1)
Total Protein: 7.1 g/dL (ref 6.0–8.3)

## 2012-07-19 LAB — CBC WITH DIFFERENTIAL/PLATELET
Basophils Relative: 0 % (ref 0–1)
Eosinophils Absolute: 0.2 10*3/uL (ref 0.0–0.7)
Eosinophils Relative: 2 % (ref 0–5)
Hemoglobin: 11 g/dL — ABNORMAL LOW (ref 12.0–15.0)
MCH: 32.4 pg (ref 26.0–34.0)
MCHC: 34.3 g/dL (ref 30.0–36.0)
Monocytes Absolute: 0.5 10*3/uL (ref 0.1–1.0)
Monocytes Relative: 5 % (ref 3–12)
Neutrophils Relative %: 77 % (ref 43–77)

## 2012-07-19 LAB — GLUCOSE, CAPILLARY: Glucose-Capillary: 151 mg/dL — ABNORMAL HIGH (ref 70–99)

## 2012-07-19 NOTE — ED Notes (Signed)
Pt reports thinking she ate too much. Pt took tums, kept burping, and started vomiting.

## 2012-07-19 NOTE — ED Notes (Signed)
Pt reports no trauma, no fall, "just sitting in bed when she felt faint".

## 2012-07-19 NOTE — Progress Notes (Signed)
  Subjective:    Patient ID: Samantha Mckenzie, female    DOB: 08-15-1936, 76 y.o.   MRN: [id number]  HPI Here to discuss her blood pressure. BP has been in the low side the last 2 days, 97/60, 91/57, pulse is stable in the 50s. Previously, her BP was actually around the 120s, 130s. Her medication regimen has not changed, she has not changed her BP cuff.   Past Medical History: h/o breast cancer--- surgery,chemo, XRT; had peripheral neuropathy (imbalance at times) after chemo Diabetes mellitus, type II Gout Hyperlipidemia Hypertension Renal insufficiency Osteoarthritis H/o anemia   GERD EGD 02-2007-- gastritis GERD Carotid u/s Nl 11-07 (had a carotid bruit) Allergic rhinitis   Past Surgical History: Mastectomy AND [id number](5956) open Lung Bx (1999) ----> neg   Social History:   Married   husband w/ several medical problems   2 children, 2 gk   tobacco-- never   ETOH-- rarely   diet-- healthy   exercise-- not exercising    Review of Systems Denies fever or chills No nausea, vomiting, blood in the stools Denies feeling weak, she does feel a little off balance which is nothing new to her, no falls. Denies headaches Taking fluids as usual    Objective:   Physical Exam  General -- alert, well-developed , not pale Lungs -- normal respiratory effort, no intercostal retractions, no accessory muscle use, and normal breath sounds.  Heart-- normal rate, regular rhythm, no murmur, and no gallop.  Extremities-- no pretibial edema bilaterally   Psych-- Cognition and judgment appear intact. Alert and cooperative with normal attention span and concentration. not anxious appearing and not depressed appearing.      Assessment & Plan:

## 2012-07-19 NOTE — ED Notes (Signed)
Samantha Mckenzie     [place] [phone]

## 2012-07-19 NOTE — ED Notes (Signed)
Pt states she normally has to get blood transfusions when she goes to the hospital.

## 2012-07-19 NOTE — Assessment & Plan Note (Addendum)
BP usually well controlled,  slightly low per her own measurements in the last couple of days, review of systems is essentially negative, she has not changed any medication. Will decrease slightly the doses of beta blockers, see instructions.

## 2012-07-19 NOTE — ED Notes (Signed)
Pt c/o "feeling faint" N/V/D at home 2000 today. Pt BP 70/40 at initial EMS arrival. Pt BP 114/56 HR 64 RR 16 CBG 139 SpO2 95% RA at 2155.  Hx of Left Breast mastectomy. Medications at bedside. AAOx4.

## 2012-07-19 NOTE — Patient Instructions (Addendum)
Continue with all medications as before except for metoprolol, take only half tablet daily. Check the  blood pressure 2 or 3 times a week, be sure it is between 110/60 and 140/85. If it is consistently higher or lower, let me know

## 2012-07-19 NOTE — ED Notes (Signed)
GAY:GE72<WT> Expected date:<BR> Expected time:<BR> Means of arrival:<BR> Comments:<BR> EMS/faint and weak-N/V/D-SBP 70 before fluid bolus

## 2012-07-20 ENCOUNTER — Ambulatory Visit: Payer: Medicare Other | Admitting: Internal Medicine

## 2012-07-20 LAB — URINALYSIS, ROUTINE W REFLEX MICROSCOPIC
Glucose, UA: NEGATIVE mg/dL
Leukocytes, UA: NEGATIVE
Nitrite: NEGATIVE
pH: 5 (ref 5.0–8.0)

## 2012-07-20 LAB — LACTIC ACID, PLASMA: Lactic Acid, Venous: 1.5 mmol/L (ref 0.5–2.2)

## 2012-07-20 MED ORDER — ONDANSETRON HCL 4 MG/2ML IJ SOLN
4.0000 mg | Freq: Once | INTRAMUSCULAR | Status: AC
  Administered 2012-07-20: 4 mg via INTRAVENOUS
  Filled 2012-07-20: qty 2

## 2012-07-20 MED ORDER — SODIUM CHLORIDE 0.9 % IV BOLUS (SEPSIS)
1000.0000 mL | Freq: Once | INTRAVENOUS | Status: AC
  Administered 2012-07-20: 1000 mL via INTRAVENOUS

## 2012-07-20 MED ORDER — ONDANSETRON HCL 4 MG PO TABS: 4.0000 mg | ORAL_TABLET | Freq: Four times a day (QID) | ORAL | Status: DC

## 2012-07-20 NOTE — ED Notes (Signed)
Patient positioned for comfort. Water given.

## 2012-07-20 NOTE — ED Notes (Signed)
Staff ambulated pt into the bathroom, pt had no dizziness, pt was able to dress herself.

## 2012-07-20 NOTE — ED Notes (Signed)
Spoke with lab. Lactic acid being run at this time.

## 2012-07-20 NOTE — ED Provider Notes (Signed)
History     CSN: [id number]  Arrival date & time 07/19/12  2155   First MD Initiated Contact with Patient 07/19/12 2309      Chief Complaint  Patient presents with  . Weakness    (Consider location/radiation/quality/duration/timing/severity/associated sxs/prior treatment) Patient is a 76 y.o. female presenting with weakness.  Weakness The primary symptoms include nausea and vomiting. Primary symptoms do not include headaches or fever.  Additional symptoms include weakness. Additional symptoms do not include neck stiffness.   HX per PT. Ate dinner at [employer], came home feeling nauseated with diarrhea and then developed diarrhea. No F/C.  feeling weak and called EMS and reported BP 70/ 40. No recent ABX or travel. No known sick contacts. Mod in severity. BP imporved with IVFs PTA. No blood in stool or emesis. Had flu shot earlier today. No rash or known allergies to flu shot.  Past Medical History  Diagnosis Date  . Breast CA     surgery, chemo, XRT; had peripheral neuropathy (imbalance at times) after chemo  . Anemia   . Gout   . Hyperlipidemia   . Hypertension   . Renal insufficiency   . Osteoarthritis   . GERD (gastroesophageal reflux disease)     gastritis, EGD 02/2007  . Allergic rhinitis   . Depression   . Macular degeneration 2011    Past Surgical History  Procedure Date  . Mastectomy 1999    and lymphnodes   . Lung biopsy 1999    neg  . Cataracts bilaterally 1 -2012    Family History  Problem Relation Age of Onset  . Breast cancer      ? of   . Colon cancer Neg Hx   . Heart attack Father 77  . Diabetes Sister     2  . Hypertension Sister     History  Substance Use Topics  . Smoking status: Never Smoker   . Smokeless tobacco: Never Used  . Alcohol Use: 0.0 oz/week    0 Glasses of wine per week     rarely    OB History    Grav Para Term Preterm Abortions TAB SAB Ect Mult Living                  Review of Systems  Constitutional:  Negative for fever and chills.  HENT: Negative for neck pain and neck stiffness.   Eyes: Negative for pain.  Respiratory: Negative for shortness of breath.   Cardiovascular: Negative for chest pain.  Gastrointestinal: Positive for nausea, vomiting and diarrhea. Negative for abdominal pain, constipation and blood in stool.  Genitourinary: Negative for dysuria.  Musculoskeletal: Negative for back pain.  Skin: Negative for rash.  Neurological: Positive for weakness. Negative for headaches.  All other systems reviewed and are negative.    Allergies  Codeine; Morphine; Oxycodone-acetaminophen; Sulfonamide derivatives; and Tramadol hcl  Home Medications   Current Outpatient Rx  Name Route Sig Dispense Refill  . ALLOPURINOL 100 MG PO TABS Oral Take 1 tablet (100 mg total) by mouth daily. 90 tablet 3  . AMLODIPINE BESYLATE 5 MG PO TABS Oral Take 1 tablet (5 mg total) by mouth daily. 90 tablet 3  . ASPIRIN 81 MG PO TABS Oral Take 81 mg by mouth daily.      . ATORVASTATIN CALCIUM 20 MG PO TABS Oral Take 1 tablet (20 mg total) by mouth daily. 90 tablet 3  . BRIMONIDINE TARTRATE-TIMOLOL 0.2-0.5 % OP SOLN Both Eyes Place 1 drop  into both eyes every 12 (twelve) hours.    Marland Kitchen VITAMIN D3 1000 UNITS PO CAPS Oral Take 1 capsule by mouth daily.     Marland Kitchen ESOMEPRAZOLE MAGNESIUM 40 MG PO CPDR Oral Take 1 capsule (40 mg total) by mouth daily before breakfast. 180 capsule 3  . GABAPENTIN 100 MG PO CAPS Oral Take 100-200 mg by mouth at bedtime as needed. For pain    . METOPROLOL TARTRATE 50 MG PO TABS Oral Take 25 mg by mouth 2 (two) times daily.     . CENTRUM SILVER PO Oral Take 1 tablet by mouth daily.     Marland Kitchen VALSARTAN 320 MG PO TABS Oral Take 160 mg by mouth daily.     Marland Kitchen VITAMIN B-12 1000 MCG PO TABS Oral Take 1,000 mcg by mouth daily.        BP 135/49  Pulse 59  Temp 97.5 F (36.4 C) (Oral)  Resp 15  SpO2 99%  Physical Exam  Constitutional: She is oriented to person, place, and time. She appears  well-developed and well-nourished.  HENT:  Head: Normocephalic and atraumatic.       mmm  Eyes: Conjunctivae normal and EOM are normal. Pupils are equal, round, and reactive to light.  Neck: Trachea normal. Neck supple. No thyromegaly present.  Cardiovascular: Normal rate, regular rhythm, S1 normal, S2 normal and normal pulses.     No systolic murmur is present   No diastolic murmur is present  Pulses:      Radial pulses are 2+ on the right side, and 2+ on the left side.  Pulmonary/Chest: Effort normal and breath sounds normal. She has no wheezes. She has no rhonchi. She has no rales. She exhibits no tenderness.  Abdominal: Soft. Normal appearance and bowel sounds are normal. She exhibits no distension and no mass. There is no tenderness. There is no rebound, no guarding, no CVA tenderness and negative Murphy's sign.  Musculoskeletal:       BLE:s Calves nontender, no cords or erythema, negative Homans sign  Neurological: She is alert and oriented to person, place, and time. She has normal strength. No cranial nerve deficit or sensory deficit. GCS eye subscore is 4. GCS verbal subscore is 5. GCS motor subscore is 6.  Skin: Skin is warm and dry. No rash noted. She is not diaphoretic.  Psychiatric: Her speech is normal.       Cooperative and appropriate    ED Course  Procedures (including critical care time)  Results for orders placed during the hospital encounter of 07/19/12  CBC WITH DIFFERENTIAL      Component Value Range   WBC 10.9 (*) 4.0 - 10.5 K/uL   RBC 3.40 (*) 3.87 - 5.11 MIL/uL   Hemoglobin 11.0 (*) 12.0 - 15.0 g/dL   HCT 32.1 (*) 36.0 - 46.0 %   MCV 94.4  78.0 - 100.0 fL   MCH 32.4  26.0 - 34.0 pg   MCHC 34.3  30.0 - 36.0 g/dL   RDW 15.1  11.5 - 15.5 %   Platelets 187  150 - 400 K/uL   Neutrophils Relative 77  43 - 77 %   Neutro Abs 8.4 (*) 1.7 - 7.7 K/uL   Lymphocytes Relative 16  12 - 46 %   Lymphs Abs 1.8  0.7 - 4.0 K/uL   Monocytes Relative 5  3 - 12 %    Monocytes Absolute 0.5  0.1 - 1.0 K/uL   Eosinophils Relative 2  0 - 5 %  Eosinophils Absolute 0.2  0.0 - 0.7 K/uL   Basophils Relative 0  0 - 1 %   Basophils Absolute 0.0  0.0 - 0.1 K/uL  COMPREHENSIVE METABOLIC PANEL      Component Value Range   Sodium 133 (*) 135 - 145 mEq/L   Potassium 4.9  3.5 - 5.1 mEq/L   Chloride 102  96 - 112 mEq/L   CO2 17 (*) 19 - 32 mEq/L   Glucose, Bld 169 (*) 70 - 99 mg/dL   BUN 47 (*) 6 - 23 mg/dL   Creatinine, Ser 1.91 (*) 0.50 - 1.10 mg/dL   Calcium 8.8  8.4 - 10.5 mg/dL   Total Protein 7.1  6.0 - 8.3 g/dL   Albumin 3.2 (*) 3.5 - 5.2 g/dL   AST 26  0 - 37 U/L   ALT 22  0 - 35 U/L   Alkaline Phosphatase 78  39 - 117 U/L   Total Bilirubin 0.2 (*) 0.3 - 1.2 mg/dL   GFR calc non Af Amer 25 (*) >90 mL/min   GFR calc Af Amer 29 (*) >90 mL/min  URINALYSIS, ROUTINE W REFLEX MICROSCOPIC      Component Value Range   Color, Urine YELLOW  YELLOW   APPearance CLEAR  CLEAR   Specific Gravity, Urine 1.007  1.005 - 1.030   pH 5.0  5.0 - 8.0   Glucose, UA NEGATIVE  NEGATIVE mg/dL   Hgb urine dipstick NEGATIVE  NEGATIVE   Bilirubin Urine NEGATIVE  NEGATIVE   Ketones, ur NEGATIVE  NEGATIVE mg/dL   Protein, ur NEGATIVE  NEGATIVE mg/dL   Urobilinogen, UA 0.2  0.0 - 1.0 mg/dL   Nitrite NEGATIVE  NEGATIVE   Leukocytes, UA NEGATIVE  NEGATIVE  GLUCOSE, CAPILLARY      Component Value Range   Glucose-Capillary 151 (*) 70 - 99 mg/dL   Comment 1 Documented in Chart     Comment 2 Notify RN    LACTIC ACID, PLASMA      Component Value Range   Lactic Acid, Venous 1.5  0.5 - 2.2 mmol/L   IVFs. Zofran. Labs  1:34 AM recheck feeling much better. No further emesis or BM. Tolerating PO fluids.   3:03 AM Lactate WNL, no hypotension in ED. PT requesting to be discharged home. UA, CBC and metabolic panel as above. No ABD tenderness or acute ABD serial exams.   MDM   N/V/D now resolved - reported hypotension in route improved with IVFs. Work up as above. Prolonged  period of observation in the ED with serial evaluations and improved condition. PT felt to be appropriate for d/c home with strict return precautions verbalized as understood.         Teressa Lower, MD 07/21/12 1125

## 2012-07-20 NOTE — ED Notes (Signed)
[facility] contacted 

## 2012-07-20 NOTE — ED Notes (Signed)
Contacted laboratory. They confirmed to me that it will take ten more minutes.

## 2012-07-23 ENCOUNTER — Telehealth: Payer: Self-pay | Admitting: Internal Medicine

## 2012-07-23 NOTE — Telephone Encounter (Signed)
Went to the ER w/ GI sx 07-19-12, please check on her , better? BP ok?

## 2012-07-24 NOTE — Telephone Encounter (Signed)
lmovm for pt to return call.  

## 2012-08-21 ENCOUNTER — Ambulatory Visit
Admission: RE | Admit: 2012-08-21 | Discharge: 2012-08-21 | Disposition: A | Discharge status: Home / Self Care | Payer: Medicare Other | Source: Ambulatory Visit | Attending: Internal Medicine | Admitting: Internal Medicine

## 2012-08-21 DIAGNOSIS — Z1231 Encounter for screening mammogram for malignant neoplasm of breast: Secondary | ICD-10-CM

## 2012-08-21 DIAGNOSIS — Z9012 Acquired absence of left breast and nipple: Secondary | ICD-10-CM

## 2012-09-22 DIAGNOSIS — N184 Chronic kidney disease, stage 4 (severe): Secondary | ICD-10-CM | POA: Diagnosis not present

## 2012-09-22 DIAGNOSIS — I129 Hypertensive chronic kidney disease with stage 1 through stage 4 chronic kidney disease, or unspecified chronic kidney disease: Secondary | ICD-10-CM | POA: Diagnosis not present

## 2012-09-22 DIAGNOSIS — I10 Essential (primary) hypertension: Secondary | ICD-10-CM | POA: Diagnosis not present

## 2012-09-22 DIAGNOSIS — D649 Anemia, unspecified: Secondary | ICD-10-CM | POA: Diagnosis not present

## 2012-09-22 DIAGNOSIS — N2581 Secondary hyperparathyroidism of renal origin: Secondary | ICD-10-CM | POA: Diagnosis not present

## 2012-10-13 ENCOUNTER — Ambulatory Visit (INDEPENDENT_AMBULATORY_CARE_PROVIDER_SITE_OTHER): Payer: Medicare Other | Admitting: Ophthalmology

## 2012-11-01 ENCOUNTER — Ambulatory Visit (INDEPENDENT_AMBULATORY_CARE_PROVIDER_SITE_OTHER): Payer: Medicare Other | Admitting: Ophthalmology

## 2012-11-01 DIAGNOSIS — H35379 Puckering of macula, unspecified eye: Secondary | ICD-10-CM | POA: Diagnosis not present

## 2012-11-01 DIAGNOSIS — H43819 Vitreous degeneration, unspecified eye: Secondary | ICD-10-CM | POA: Diagnosis not present

## 2012-11-01 DIAGNOSIS — H40059 Ocular hypertension, unspecified eye: Secondary | ICD-10-CM | POA: Diagnosis not present

## 2012-11-11 ENCOUNTER — Other Ambulatory Visit: Payer: Self-pay | Admitting: Internal Medicine

## 2012-11-13 NOTE — Telephone Encounter (Signed)
Refill done.  

## 2012-12-20 DIAGNOSIS — H10019 Acute follicular conjunctivitis, unspecified eye: Secondary | ICD-10-CM | POA: Diagnosis not present

## 2013-01-06 ENCOUNTER — Other Ambulatory Visit: Payer: Self-pay | Admitting: Internal Medicine

## 2013-01-08 NOTE — Telephone Encounter (Signed)
Refill done.  

## 2013-01-18 ENCOUNTER — Other Ambulatory Visit: Payer: Self-pay | Admitting: Internal Medicine

## 2013-01-19 NOTE — Telephone Encounter (Signed)
Refill done.  

## 2013-01-23 DIAGNOSIS — H40059 Ocular hypertension, unspecified eye: Secondary | ICD-10-CM | POA: Diagnosis not present

## 2013-01-23 DIAGNOSIS — H35379 Puckering of macula, unspecified eye: Secondary | ICD-10-CM | POA: Diagnosis not present

## 2013-02-19 ENCOUNTER — Other Ambulatory Visit: Payer: Self-pay | Admitting: Internal Medicine

## 2013-02-19 NOTE — Telephone Encounter (Signed)
Refill done.  

## 2013-03-26 DIAGNOSIS — M109 Gout, unspecified: Secondary | ICD-10-CM | POA: Diagnosis not present

## 2013-03-26 DIAGNOSIS — I10 Essential (primary) hypertension: Secondary | ICD-10-CM | POA: Diagnosis not present

## 2013-03-26 DIAGNOSIS — N2581 Secondary hyperparathyroidism of renal origin: Secondary | ICD-10-CM | POA: Diagnosis not present

## 2013-03-26 DIAGNOSIS — D649 Anemia, unspecified: Secondary | ICD-10-CM | POA: Diagnosis not present

## 2013-03-26 DIAGNOSIS — I129 Hypertensive chronic kidney disease with stage 1 through stage 4 chronic kidney disease, or unspecified chronic kidney disease: Secondary | ICD-10-CM | POA: Diagnosis not present

## 2013-04-05 ENCOUNTER — Other Ambulatory Visit (HOSPITAL_COMMUNITY): Payer: Self-pay

## 2013-04-06 ENCOUNTER — Ambulatory Visit (HOSPITAL_COMMUNITY)
Admission: RE | Admit: 2013-04-06 | Discharge: 2013-04-06 | Disposition: A | Discharge status: Home / Self Care | Payer: Medicare Other | Source: Ambulatory Visit | Attending: Nephrology | Admitting: Nephrology

## 2013-04-06 DIAGNOSIS — N184 Chronic kidney disease, stage 4 (severe): Secondary | ICD-10-CM | POA: Diagnosis not present

## 2013-04-06 DIAGNOSIS — D509 Iron deficiency anemia, unspecified: Secondary | ICD-10-CM | POA: Insufficient documentation

## 2013-04-06 MED ORDER — SODIUM CHLORIDE 0.9 % IV SOLN
INTRAVENOUS | Status: DC
  Administered 2013-04-06: 12:00:00 via INTRAVENOUS

## 2013-04-06 MED ORDER — FERUMOXYTOL INJECTION 510 MG/17 ML
1020.0000 mg | Freq: Once | INTRAVENOUS | Status: AC
  Administered 2013-04-06: 1020 mg via INTRAVENOUS
  Filled 2013-04-06: qty 34

## 2013-04-07 ENCOUNTER — Other Ambulatory Visit: Payer: Self-pay | Admitting: Internal Medicine

## 2013-04-09 NOTE — Telephone Encounter (Signed)
Refill done.  

## 2013-04-10 ENCOUNTER — Other Ambulatory Visit: Payer: Self-pay | Admitting: Internal Medicine

## 2013-04-10 NOTE — Telephone Encounter (Signed)
Refill done.  

## 2013-04-19 ENCOUNTER — Encounter: Payer: Self-pay | Admitting: Internal Medicine

## 2013-04-19 ENCOUNTER — Ambulatory Visit (INDEPENDENT_AMBULATORY_CARE_PROVIDER_SITE_OTHER): Payer: Medicare Other | Admitting: Internal Medicine

## 2013-04-19 ENCOUNTER — Inpatient Hospital Stay (HOSPITAL_COMMUNITY)
Admission: EM | Admit: 2013-04-19 | Discharge: 2013-04-23 | DRG: 872 | Disposition: A | Discharge status: Home / Self Care | Payer: Medicare Other | Attending: Internal Medicine | Admitting: Internal Medicine

## 2013-04-19 ENCOUNTER — Encounter (HOSPITAL_COMMUNITY): Payer: Self-pay

## 2013-04-19 VITALS — BP 152/78 | HR 100 | Temp 103.1°F | Resp 14 | Wt 145.0 lb

## 2013-04-19 DIAGNOSIS — IMO0002 Reserved for concepts with insufficient information to code with codable children: Secondary | ICD-10-CM

## 2013-04-19 DIAGNOSIS — F329 Major depressive disorder, single episode, unspecified: Secondary | ICD-10-CM | POA: Diagnosis present

## 2013-04-19 DIAGNOSIS — M1990 Unspecified osteoarthritis, unspecified site: Secondary | ICD-10-CM

## 2013-04-19 DIAGNOSIS — E538 Deficiency of other specified B group vitamins: Secondary | ICD-10-CM

## 2013-04-19 DIAGNOSIS — Z8614 Personal history of Methicillin resistant Staphylococcus aureus infection: Secondary | ICD-10-CM | POA: Diagnosis not present

## 2013-04-19 DIAGNOSIS — D649 Anemia, unspecified: Secondary | ICD-10-CM | POA: Diagnosis present

## 2013-04-19 DIAGNOSIS — L03114 Cellulitis of left upper limb: Secondary | ICD-10-CM

## 2013-04-19 DIAGNOSIS — M109 Gout, unspecified: Secondary | ICD-10-CM | POA: Diagnosis present

## 2013-04-19 DIAGNOSIS — G609 Hereditary and idiopathic neuropathy, unspecified: Secondary | ICD-10-CM | POA: Diagnosis present

## 2013-04-19 DIAGNOSIS — E118 Type 2 diabetes mellitus with unspecified complications: Secondary | ICD-10-CM | POA: Diagnosis present

## 2013-04-19 DIAGNOSIS — K219 Gastro-esophageal reflux disease without esophagitis: Secondary | ICD-10-CM | POA: Diagnosis not present

## 2013-04-19 DIAGNOSIS — I890 Lymphedema, not elsewhere classified: Secondary | ICD-10-CM

## 2013-04-19 DIAGNOSIS — I129 Hypertensive chronic kidney disease with stage 1 through stage 4 chronic kidney disease, or unspecified chronic kidney disease: Secondary | ICD-10-CM | POA: Diagnosis present

## 2013-04-19 DIAGNOSIS — E119 Type 2 diabetes mellitus without complications: Secondary | ICD-10-CM | POA: Diagnosis present

## 2013-04-19 DIAGNOSIS — R6883 Chills (without fever): Secondary | ICD-10-CM | POA: Diagnosis not present

## 2013-04-19 DIAGNOSIS — A419 Sepsis, unspecified organism: Secondary | ICD-10-CM | POA: Diagnosis not present

## 2013-04-19 DIAGNOSIS — I10 Essential (primary) hypertension: Secondary | ICD-10-CM | POA: Diagnosis not present

## 2013-04-19 DIAGNOSIS — F3289 Other specified depressive episodes: Secondary | ICD-10-CM | POA: Diagnosis present

## 2013-04-19 DIAGNOSIS — H3530 Unspecified macular degeneration: Secondary | ICD-10-CM | POA: Diagnosis present

## 2013-04-19 DIAGNOSIS — N189 Chronic kidney disease, unspecified: Secondary | ICD-10-CM | POA: Diagnosis present

## 2013-04-19 DIAGNOSIS — R6889 Other general symptoms and signs: Secondary | ICD-10-CM

## 2013-04-19 DIAGNOSIS — E785 Hyperlipidemia, unspecified: Secondary | ICD-10-CM | POA: Diagnosis not present

## 2013-04-19 DIAGNOSIS — Z853 Personal history of malignant neoplasm of breast: Secondary | ICD-10-CM | POA: Diagnosis not present

## 2013-04-19 DIAGNOSIS — C50919 Malignant neoplasm of unspecified site of unspecified female breast: Secondary | ICD-10-CM

## 2013-04-19 DIAGNOSIS — R509 Fever, unspecified: Secondary | ICD-10-CM | POA: Diagnosis not present

## 2013-04-19 DIAGNOSIS — M79609 Pain in unspecified limb: Secondary | ICD-10-CM | POA: Diagnosis not present

## 2013-04-19 DIAGNOSIS — M542 Cervicalgia: Secondary | ICD-10-CM

## 2013-04-19 DIAGNOSIS — N259 Disorder resulting from impaired renal tubular function, unspecified: Secondary | ICD-10-CM

## 2013-04-19 DIAGNOSIS — R5381 Other malaise: Secondary | ICD-10-CM

## 2013-04-19 DIAGNOSIS — Z8719 Personal history of other diseases of the digestive system: Secondary | ICD-10-CM

## 2013-04-19 HISTORY — DX: Cellulitis, unspecified: L03.90

## 2013-04-19 LAB — COMPREHENSIVE METABOLIC PANEL
ALT: 29 U/L (ref 0–35)
AST: 31 U/L (ref 0–37)
Albumin: 3.7 g/dL (ref 3.5–5.2)
Alkaline Phosphatase: 99 U/L (ref 39–117)
Calcium: 9.2 mg/dL (ref 8.4–10.5)
GFR calc Af Amer: 26 mL/min — ABNORMAL LOW (ref >90)
Potassium: 5.1 mEq/L (ref 3.5–5.1)
Sodium: 139 mEq/L (ref 135–145)
Total Protein: 8 g/dL (ref 6.0–8.3)

## 2013-04-19 LAB — CBC WITH DIFFERENTIAL/PLATELET
Basophils Absolute: 0 10*3/uL (ref 0.0–0.1)
Basophils Relative: 0 % (ref 0–1)
Eosinophils Absolute: 0 10*3/uL (ref 0.0–0.7)
Eosinophils Relative: 0 % (ref 0–5)
Lymphs Abs: 0.6 10*3/uL — ABNORMAL LOW (ref 0.7–4.0)
MCH: 32.5 pg (ref 26.0–34.0)
MCHC: 35 g/dL (ref 30.0–36.0)
MCV: 92.8 fL (ref 78.0–100.0)
Neutrophils Relative %: 92 % — ABNORMAL HIGH (ref 43–77)
Platelets: 150 10*3/uL (ref 150–400)
RBC: 4.31 MIL/uL (ref 3.87–5.11)
RDW: 15.5 % (ref 11.5–15.5)

## 2013-04-19 LAB — URINALYSIS, ROUTINE W REFLEX MICROSCOPIC
Bilirubin Urine: NEGATIVE
Hgb urine dipstick: NEGATIVE
Specific Gravity, Urine: 1.01 (ref 1.005–1.030)
Urobilinogen, UA: 0.2 mg/dL (ref 0.0–1.0)
pH: 6 (ref 5.0–8.0)

## 2013-04-19 LAB — URINE MICROSCOPIC-ADD ON

## 2013-04-19 MED ORDER — VANCOMYCIN HCL IN DEXTROSE 750-5 MG/150ML-% IV SOLN
750.0000 mg | INTRAVENOUS | Status: DC
  Administered 2013-04-20 – 2013-04-22 (×3): 750 mg via INTRAVENOUS
  Filled 2013-04-19 (×4): qty 150

## 2013-04-19 MED ORDER — ASPIRIN 81 MG PO CHEW
81.0000 mg | CHEWABLE_TABLET | Freq: Every day | ORAL | Status: DC
  Administered 2013-04-20 – 2013-04-23 (×4): 81 mg via ORAL
  Filled 2013-04-19 (×4): qty 1

## 2013-04-19 MED ORDER — SODIUM CHLORIDE 0.9 % IV SOLN
INTRAVENOUS | Status: DC
  Administered 2013-04-19: 23:00:00 via INTRAVENOUS

## 2013-04-19 MED ORDER — HEPARIN SODIUM (PORCINE) 5000 UNIT/ML IJ SOLN
5000.0000 [IU] | Freq: Three times a day (TID) | INTRAMUSCULAR | Status: DC
  Administered 2013-04-20 – 2013-04-23 (×11): 5000 [IU] via SUBCUTANEOUS
  Filled 2013-04-19 (×14): qty 1

## 2013-04-19 MED ORDER — ALLOPURINOL 100 MG PO TABS
100.0000 mg | ORAL_TABLET | Freq: Every day | ORAL | Status: DC
  Administered 2013-04-20 – 2013-04-23 (×4): 100 mg via ORAL
  Filled 2013-04-19 (×4): qty 1

## 2013-04-19 MED ORDER — SODIUM CHLORIDE 0.9 % IJ SOLN
3.0000 mL | Freq: Two times a day (BID) | INTRAMUSCULAR | Status: DC
  Administered 2013-04-20 – 2013-04-22 (×7): 3 mL via INTRAVENOUS

## 2013-04-19 MED ORDER — ATORVASTATIN CALCIUM 20 MG PO TABS
20.0000 mg | ORAL_TABLET | Freq: Every day | ORAL | Status: DC
  Administered 2013-04-19 – 2013-04-22 (×4): 20 mg via ORAL
  Filled 2013-04-19 (×5): qty 1

## 2013-04-19 MED ORDER — GABAPENTIN 100 MG PO CAPS
100.0000 mg | ORAL_CAPSULE | Freq: Every day | ORAL | Status: DC
  Administered 2013-04-19: 100 mg via ORAL
  Administered 2013-04-20 – 2013-04-22 (×3): 200 mg via ORAL
  Filled 2013-04-19 (×5): qty 2

## 2013-04-19 MED ORDER — ESCITALOPRAM OXALATE 10 MG PO TABS
10.0000 mg | ORAL_TABLET | Freq: Every day | ORAL | Status: DC
  Administered 2013-04-19 – 2013-04-22 (×4): 10 mg via ORAL
  Filled 2013-04-19 (×5): qty 1

## 2013-04-19 MED ORDER — IRBESARTAN 300 MG PO TABS
300.0000 mg | ORAL_TABLET | Freq: Every day | ORAL | Status: DC
  Administered 2013-04-20 – 2013-04-23 (×4): 300 mg via ORAL
  Filled 2013-04-19 (×4): qty 1

## 2013-04-19 MED ORDER — SODIUM CHLORIDE 0.9 % IV SOLN: 1000.0000 mL | INTRAVENOUS | Status: DC

## 2013-04-19 MED ORDER — ACETAMINOPHEN 325 MG PO TABS
650.0000 mg | ORAL_TABLET | Freq: Once | ORAL | Status: AC
  Administered 2013-04-19: 650 mg via ORAL
  Filled 2013-04-19: qty 2

## 2013-04-19 MED ORDER — SODIUM CHLORIDE 0.9 % IV SOLN: 1000.0000 mL | Freq: Once | INTRAVENOUS | Status: DC

## 2013-04-19 MED ORDER — VITAMIN B-12 1000 MCG PO TABS
1000.0000 ug | ORAL_TABLET | Freq: Every day | ORAL | Status: DC
  Administered 2013-04-20 – 2013-04-23 (×4): 1000 ug via ORAL
  Filled 2013-04-19 (×4): qty 1

## 2013-04-19 MED ORDER — PANTOPRAZOLE SODIUM 40 MG PO TBEC
80.0000 mg | DELAYED_RELEASE_TABLET | Freq: Every day | ORAL | Status: DC
  Administered 2013-04-20 – 2013-04-22 (×3): 80 mg via ORAL
  Filled 2013-04-19 (×2): qty 2

## 2013-04-19 MED ORDER — AMLODIPINE BESYLATE 5 MG PO TABS
5.0000 mg | ORAL_TABLET | Freq: Every day | ORAL | Status: DC
  Administered 2013-04-20 – 2013-04-23 (×4): 5 mg via ORAL
  Filled 2013-04-19 (×4): qty 1

## 2013-04-19 MED ORDER — SODIUM CHLORIDE 0.9 % IV SOLN
1000.0000 mL | Freq: Once | INTRAVENOUS | Status: AC
  Administered 2013-04-19: 1000 mL via INTRAVENOUS

## 2013-04-19 MED ORDER — ASPIRIN 81 MG PO TABS: 81.0000 mg | ORAL_TABLET | Freq: Every day | ORAL | Status: DC

## 2013-04-19 MED ORDER — VANCOMYCIN HCL IN DEXTROSE 1-5 GM/200ML-% IV SOLN
1000.0000 mg | Freq: Once | INTRAVENOUS | Status: AC
  Administered 2013-04-19: 1000 mg via INTRAVENOUS
  Filled 2013-04-19 (×2): qty 200

## 2013-04-19 NOTE — H&P (Signed)
[facility] History and Physical  Samantha Mckenzie ZGY:[id number] DOB: 1936-05-05 DOA: 04/19/2013  Referring physician: ED PCP: Samantha November, MD   Chief Complaint: Left arm pain, rigors  HPI: Samantha Mckenzie is a 77 y.o. female who presents to the ED with c/o fever, chills, rigors, L arm pain, erythema.  Symptoms onset this morning, and have been persistant throughout the day.  This occurs in the context of chronic LUE lymphedema secondary to mastectomy for BRCA in 1999 (no recurrence).  She has had multiple episodes of recurrent cellulitis in the LUE because of this in the past.  Tylenol makes fever better.  In ED she was started on vancomycin for cellulitis, fever 103.1, tachycardia 125, tachycardia resolved with IVF and fever improved with tylenol.  Hospitalist asked to admit.  Review of Systems: 12 systems reviewed and otherwise negative.  Past Medical History  Diagnosis Date  . Breast CA     surgery, chemo, XRT; had peripheral neuropathy (imbalance at times) after chemo  . Anemia   . Gout   . Hyperlipidemia   . Hypertension   . Renal insufficiency   . Osteoarthritis   . GERD (gastroesophageal reflux disease)     gastritis, EGD 02/2007  . Allergic rhinitis   . Depression   . Macular degeneration 2011  . Cellulitis     Left arm   Past Surgical History  Procedure Laterality Date  . Mastectomy  1999    and lymphnodes   . Lung biopsy  1999    neg  . Cataracts bilaterally  1 -2012   Social History:  reports that she has never smoked. She has never used smokeless tobacco. She reports that  drinks alcohol. She reports that she does not use illicit drugs.   Allergies  Allergen Reactions  . Codeine Nausea And Vomiting  . Morphine Nausea And Vomiting  . Oxycodone-Acetaminophen Nausea And Vomiting  . Sulfonamide Derivatives Nausea And Vomiting  . Tramadol Hcl Nausea And Vomiting    Family History  Problem Relation Age of Onset  . Breast cancer      ? of   . Colon  cancer Neg Hx   . Heart attack Father 69  . Diabetes Sister     2  . Hypertension Sister     Prior to Admission medications   Medication Sig Start Date End Date Taking? Authorizing Provider  allopurinol (ZYLOPRIM) 100 MG tablet Take 100 mg by mouth daily.   Yes Historical Provider, MD  amLODipine (NORVASC) 5 MG tablet Take 5 mg by mouth daily.   Yes Historical Provider, MD  aspirin 81 MG tablet Take 81 mg by mouth daily.     Yes Historical Provider, MD  atorvastatin (LIPITOR) 20 MG tablet Take 20 mg by mouth daily.   Yes Historical Provider, MD  Cholecalciferol (VITAMIN D3) 1000 UNITS CAPS Take 1 capsule by mouth daily.    Yes Historical Provider, MD  escitalopram (LEXAPRO) 10 MG tablet Take 10 mg by mouth at bedtime.   Yes Historical Provider, MD  esomeprazole (NEXIUM) 40 MG capsule Take 40 mg by mouth daily before breakfast.   Yes Historical Provider, MD  gabapentin (NEURONTIN) 100 MG capsule Take 100-200 mg by mouth at bedtime.   Yes Historical Provider, MD  Multiple Vitamins-Minerals (CENTRUM SILVER PO) Take 1 tablet by mouth daily.    Yes Historical Provider, MD  valsartan (DIOVAN) 320 MG tablet Take 160 mg by mouth daily.    Yes Historical Provider, MD  vitamin  B-12 (CYANOCOBALAMIN) 1000 MCG tablet Take 1,000 mcg by mouth daily.     Yes Historical Provider, MD   Physical Exam: Filed Vitals:   04/19/13 1822 04/19/13 1930 04/19/13 2000  BP: 138/70 146/62 125/48  Pulse: 129 112 101  Temp: 102.1 F (38.9 C)    TempSrc: Oral    Resp: 18 18 17   Height: 5' 5"  (1.651 m)    Weight: 65.772 kg (145 lb)    SpO2: 94% 96% 93%    General:  NAD, resting comfortably in bed Eyes: PEERLA EOMI ENT: mucous membranes moist Neck: supple w/o JVD Cardiovascular: RRR w/o MRG Respiratory: CTA B Abdomen: soft, nt, nd, bs+ Skin: LUE with lymphedema, erythema, TTP, not TTP beyond site of erythema, pain not out of proportion to findings, no subq emphyzema Musculoskeletal: MAE, full ROM all 4  extremities Psychiatric: normal tone and affect Neurologic: AAOx3, grossly non-focal  Labs on Admission:  Basic Metabolic Panel:  Recent Labs Lab 04/19/13 1905  NA 139  K 5.1  CL 102  CO2 26  GLUCOSE 160*  BUN 46*  CREATININE 2.10*  CALCIUM 9.2   Liver Function Tests:  Recent Labs Lab 04/19/13 1905  AST 31  ALT 29  ALKPHOS 99  BILITOT 0.3  PROT 8.0  ALBUMIN 3.7   No results found for this basename: LIPASE, AMYLASE,  in the last 168 hours No results found for this basename: AMMONIA,  in the last 168 hours CBC:  Recent Labs Lab 04/19/13 1905  WBC 10.3  NEUTROABS 9.4*  HGB 14.0  HCT 40.0  MCV 92.8  PLT 150   Cardiac Enzymes: No results found for this basename: CKTOTAL, CKMB, CKMBINDEX, TROPONINI,  in the last 168 hours  BNP (last 3 results) No results found for this basename: PROBNP,  in the last 8760 hours CBG: No results found for this basename: GLUCAP,  in the last 168 hours  Radiological Exams on Admission: No results found.  EKG: Independently reviewed.  Assessment/Plan Principal Problem:   Cellulitis of left arm Active Problems:   DIABETES MELLITUS, TYPE II   HYPERTENSION   RENAL INSUFFICIENCY   Sepsis   1. Cellulitis of L arm causing sepsis - on vancomycin, IVF at 100 cc/hr, tylenol for fever, tachycardia already improving in ED, has h/o MRSA causing her cellulitis in the past so will need to continue vanc unless she can be discharged on linezolid. 2. DM2 - appears to be diet controlled at this time, not on any home meds 3. HTN - continue home meds 4. CKD - continue home meds, appears to be at baseline    Code Status: Full Code (must indicate code status--if unknown or must be presumed, indicate so) Family Communication: Spoke with son at bedside (indicate person spoken with, if applicable, with phone number if by telephone) Disposition Plan: Admit to inpatient (indicate anticipated LOS)  Time spent: 70 min  Samantha Mckenzie M. [facility] Pager [phone]  If 7PM-7AM, please contact night-coverage [email or web address] Password [facility] 04/19/2013, 8:51 PM

## 2013-04-19 NOTE — ED Provider Notes (Signed)
History    CSN: [id number] Arrival date & time 04/19/13  8756  First MD Initiated Contact with Patient 04/19/13 1838     Chief Complaint  Patient presents with  . Code Sepsis  . Joint Pain   (Consider location/radiation/quality/duration/timing/severity/associated sxs/prior Treatment) The history is provided by the patient.   patient has had redness and some pain in her left upper extremity for the last day or 2. She has a previous history of cellulitis here. She was seen by her PCP and told to come to the ED. She's had fevers. No other skin changes. No chest pain. Trouble breathing. She states she has felt fatigued. No difficulty moving the arm. She had breast cancer with mastectomy on the side and has had lymph nodes also removed. She has a previous history of MRSA Past Medical History  Diagnosis Date  . Breast CA     surgery, chemo, XRT; had peripheral neuropathy (imbalance at times) after chemo  . Anemia   . Gout   . Hyperlipidemia   . Hypertension   . Renal insufficiency   . Osteoarthritis   . GERD (gastroesophageal reflux disease)     gastritis, EGD 02/2007  . Allergic rhinitis   . Depression   . Macular degeneration 2011  . Cellulitis     Left arm   Past Surgical History  Procedure Laterality Date  . Mastectomy  1999    and lymphnodes   . Lung biopsy  1999    neg  . Cataracts bilaterally  1 -2012   Family History  Problem Relation Age of Onset  . Breast cancer      ? of   . Colon cancer Neg Hx   . Heart attack Father 41  . Diabetes Sister     2  . Hypertension Sister    History  Substance Use Topics  . Smoking status: Never Smoker   . Smokeless tobacco: Never Used  . Alcohol Use: 0.0 oz/week    0 Glasses of wine per week     Comment: rarely   OB History   Grav Para Term Preterm Abortions TAB SAB Ect Mult Living                 Review of Systems  Constitutional: Positive for fever and fatigue. Negative for activity change and appetite change.   HENT: Negative for neck stiffness.   Eyes: Negative for pain.  Respiratory: Negative for chest tightness and shortness of breath.   Cardiovascular: Negative for chest pain and leg swelling.  Gastrointestinal: Negative for nausea, vomiting, abdominal pain and diarrhea.  Genitourinary: Negative for flank pain.  Musculoskeletal: Negative for back pain.  Skin: Positive for color change. Negative for rash.  Neurological: Negative for weakness, numbness and headaches.  Psychiatric/Behavioral: Negative for behavioral problems.    Allergies  Codeine; Morphine; Oxycodone-acetaminophen; Sulfonamide derivatives; and Tramadol hcl  Home Medications   No current outpatient prescriptions on file. BP 113/57  Pulse 79  Temp(Src) 98.5 F (36.9 C) (Oral)  Resp 18  Ht 5' 3"  (1.6 m)  Wt 145 lb 1 oz (65.8 kg)  BMI 25.7 kg/m2  SpO2 95% Physical Exam  Nursing note and vitals reviewed. Constitutional: She is oriented to person, place, and time. She appears well-developed and well-nourished.  HENT:  Head: Normocephalic and atraumatic.  Eyes: EOM are normal. Pupils are equal, round, and reactive to light.  Neck: Normal range of motion. Neck supple.  Cardiovascular: Regular rhythm and normal heart sounds.  No murmur heard. Tachycardia  Pulmonary/Chest: Effort normal and breath sounds normal. No respiratory distress. She has no wheezes. She has no rales.  Abdominal: Soft. Bowel sounds are normal. She exhibits no distension. There is no tenderness. There is no rebound and no guarding.  Musculoskeletal: Normal range of motion. She exhibits edema and tenderness.  Redness and lymphedema of the left upper extremity from shoulder to wrist. And appears uninvolved. There is scar along the upper arm from previous surgery. No fluctuance. Joints are not irritable.  Neurological: She is alert and oriented to person, place, and time. No cranial nerve deficit.  Skin: Skin is warm and dry.  Psychiatric: She has a  normal mood and affect. Her speech is normal.    ED Course  Procedures (including critical care time) Labs Reviewed  CBC WITH DIFFERENTIAL - Abnormal; Notable for the following:    Neutrophils Relative % 92 (*)    Neutro Abs 9.4 (*)    Lymphocytes Relative 5 (*)    Lymphs Abs 0.6 (*)    All other components within normal limits  COMPREHENSIVE METABOLIC PANEL - Abnormal; Notable for the following:    Glucose, Bld 160 (*)    BUN 46 (*)    Creatinine, Ser 2.10 (*)    GFR calc non Af Amer 22 (*)    GFR calc Af Amer 26 (*)    All other components within normal limits  URINALYSIS, ROUTINE W REFLEX MICROSCOPIC - Abnormal; Notable for the following:    Leukocytes, UA TRACE (*)    All other components within normal limits  CBC - Abnormal; Notable for the following:    WBC 10.7 (*)    RBC 3.17 (*)    Hemoglobin 10.0 (*)    HCT 29.4 (*)    RDW 15.7 (*)    Platelets 149 (*)    All other components within normal limits  BASIC METABOLIC PANEL - Abnormal; Notable for the following:    Glucose, Bld 143 (*)    BUN 39 (*)    Creatinine, Ser 1.81 (*)    Calcium 8.2 (*)    GFR calc non Af Amer 26 (*)    GFR calc Af Amer 31 (*)    All other components within normal limits  CBC - Abnormal; Notable for the following:    RBC 2.98 (*)    Hemoglobin 9.3 (*)    HCT 27.8 (*)    RDW 16.1 (*)    All other components within normal limits  BASIC METABOLIC PANEL - Abnormal; Notable for the following:    Glucose, Bld 117 (*)    BUN 36 (*)    Creatinine, Ser 1.95 (*)    GFR calc non Af Amer 24 (*)    GFR calc Af Amer 28 (*)    All other components within normal limits  CULTURE, BLOOD (ROUTINE X 2)  CULTURE, BLOOD (ROUTINE X 2)  URINE CULTURE  MRSA PCR SCREENING  PROCALCITONIN  URINE MICROSCOPIC-ADD ON  BASIC METABOLIC PANEL  CBC  CG4 I-STAT (LACTIC ACID)   No results found. 1. Cellulitis of left upper extremity   2. Sepsis   3. Type II or unspecified type diabetes mellitus without  mention of complication, not stated as uncontrolled   4. Unspecified essential hypertension   5. Depression   6. Malignant neoplasm of breast (female), unspecified site   7. Cellulitis of left arm   8. Esophageal reflux     MDM  Patient with cellulitis of  her left upper extremity. Previous MRSA. She does not appear to be in severe sepsis at this time. Will be admitted to internal medicine  Jasper Riling. Alvino Chapel, MD 04/21/13 [phone]

## 2013-04-19 NOTE — Patient Instructions (Addendum)
Share results with [facility] ER medical staff.

## 2013-04-19 NOTE — ED Notes (Addendum)
Pt c/o all over body aches, joint pain, fever, and chills starting this am. Pt's redness and swelling noted to pt's Left arm, when asked about this pt states "that's why the dr sent me over here, I had Left breast cancer years ago and my Left arm sometime does this." pt brought paper work with her that suggest possible Left arm cellulitis with a hx of recurrent cellulitis to this extremity since 1999. Pt reports taking 2 Tylenol tablets earlier today

## 2013-04-19 NOTE — Progress Notes (Signed)
  Subjective:    Patient ID: Samantha Mckenzie, female    DOB: 1936/09/21, 77 y.o.   MRN: [id number]  HPI   Symptoms began this morning acutely between 6:30-7 AM as chills an exacerbation of arthralgias in all extremities. This was also associated with frank rigor  PMH : chronic lymphedema LUE post breast cancer 1999. Recurrent cellulitis LUE    Review of Systems  Specifically denies frontal headache, facial pain, nasal purulence, sore throat, cough, sputum production, diarrhea, dysuria, pyuria, or hematuria.  She denies any new rashes or skin lesions  She's had no tick exposure     Objective:   Physical Exam Gen.: appears fatigued & slightly uncomfortable but well-nourished in appearance. Alert, appropriate and cooperative throughout exam.Appears younger than stated age  Head: Normocephalic without obvious abnormalities  Eyes: No corneal or conjunctival inflammation noted. No icterus Ears: External  ear exam reveals no significant lesions or deformities. Canals clear .TMs normal. Hearing is grossly normal bilaterally. Nose: External nasal exam reveals no deformity or inflammation. Nasal mucosa are dry. No lesions or exudates noted.  Mouth: Oral mucosa and oropharynx reveal no lesions or exudates. Teeth in good repair. Neck: No deformities, masses, or tenderness noted. Supple w/o meningismus Lungs: Normal respiratory effort; chest expands symmetrically. Lungs are clear to auscultation without rales, wheezes, or increased work of breathing. Heart: rate rechecked @ 140 ; regular  rhythm. Normal S1 and S2. No gallop, click, or rub. Flow murmur. Abdomen: Bowel sounds normal; abdomen soft and nontender. No masses, organomegaly or hernias noted.                                Musculoskeletal/extremities:  No clubbing or cyanosis noted. Severe edema with active cellulitis from wrist to biceps. Erythema extents total of 37 cm. Range of motion normal .Tone & strength  Normal. Joints normal.  Nail health good. Able to lie down & sit up w/o help. Negative SLR bilaterally Vascular: Carotid, radial artery, dorsalis pedis and  posterior tibial pulses are full and equal. No bruits present. Neurologic: Alert and oriented x3. Deep tendon reflexes symmetrical and normal.         Skin: Intact without suspicious lesions or rashes. The left upper extremity is palpably hot to touch Lymph: No cervical, axillary lymphadenopathy present. Psych: Mood and affect are normal. Normally interactive                                                                                        Assessment & Plan:  #1 progressive cellulitis left upper extremity in the context of a history of chronic lymphedema. Associated tachycardia and extremely elevated temperature.  Plan: Inpatient therapy indicated with parenteral antibiotics.

## 2013-04-19 NOTE — Progress Notes (Signed)
ANTIBIOTIC CONSULT NOTE - INITIAL  Pharmacy Consult for vancomycin Indication: cellulitis  Allergies  Allergen Reactions  . Codeine Nausea And Vomiting  . Morphine Nausea And Vomiting  . Oxycodone-Acetaminophen Nausea And Vomiting  . Sulfonamide Derivatives Nausea And Vomiting  . Tramadol Hcl Nausea And Vomiting    Patient Measurements: Height: 5' 5"  (165.1 cm) Weight: 145 lb (65.772 kg) IBW/kg (Calculated) : 57 Adjusted Body Weight:   Vital Signs: Temp: 102.1 F (38.9 C) (07/10 1822) Temp src: Oral (07/10 1822) BP: 125/48 mmHg (07/10 2000) Pulse Rate: 101 (07/10 2000) Intake/Output from previous day:   Intake/Output from this shift:    Labs:  Recent Labs  04/19/13 1905  WBC 10.3  HGB 14.0  PLT 150  CREATININE 2.10*   Estimated Creatinine Clearance: 21.1 ml/min (by C-G formula based on Cr of 2.1). No results found for this basename: VANCOTROUGH, VANCOPEAK, VANCORANDOM, GENTTROUGH, GENTPEAK, GENTRANDOM, TOBRATROUGH, TOBRAPEAK, TOBRARND, AMIKACINPEAK, AMIKACINTROU, AMIKACIN,  in the last 72 hours   Microbiology: No results found for this or any previous visit (from the past 720 hour(s)).  Medical History: Past Medical History  Diagnosis Date  . Breast CA     surgery, chemo, XRT; had peripheral neuropathy (imbalance at times) after chemo  . Anemia   . Gout   . Hyperlipidemia   . Hypertension   . Renal insufficiency   . Osteoarthritis   . GERD (gastroesophageal reflux disease)     gastritis, EGD 02/2007  . Allergic rhinitis   . Depression   . Macular degeneration 2011  . Cellulitis     Left arm    Medications:  Anti-infectives   Start     Dose/Rate Route Frequency Ordered Stop   04/20/13 2000  vancomycin (VANCOCIN) IVPB 750 mg/150 ml premix     750 mg 150 mL/hr over 60 Minutes Intravenous Every 24 hours 04/19/13 2055     04/19/13 1900  vancomycin (VANCOCIN) IVPB 1000 mg/200 mL premix     1,000 mg 200 mL/hr over 60 Minutes Intravenous  Once  04/19/13 1846 04/19/13 2028     Assessment: 50 yof presented to the ED with body aches and fever. Initially called a code sepsis and received 1gm vancomycin. Pts tmax 103.1 and WBC is WNL, Scr is elevated at 2.1.   Goal of Therapy:  Vancomycin trough level 10-15 mcg/ml  Plan:  1. Vanc 747m IV Q24H starting tomorrow 2. F/u renal fxn, C&S, clinical [facility] RRande Lawman7/07/2013,8:55 PM

## 2013-04-19 NOTE — ED Notes (Signed)
Dr. Hurley Cisco at bedside.

## 2013-04-20 DIAGNOSIS — F329 Major depressive disorder, single episode, unspecified: Secondary | ICD-10-CM | POA: Diagnosis not present

## 2013-04-20 DIAGNOSIS — IMO0002 Reserved for concepts with insufficient information to code with codable children: Secondary | ICD-10-CM | POA: Diagnosis not present

## 2013-04-20 DIAGNOSIS — C50919 Malignant neoplasm of unspecified site of unspecified female breast: Secondary | ICD-10-CM | POA: Diagnosis not present

## 2013-04-20 DIAGNOSIS — E119 Type 2 diabetes mellitus without complications: Secondary | ICD-10-CM | POA: Diagnosis not present

## 2013-04-20 LAB — BASIC METABOLIC PANEL
BUN: 39 mg/dL — ABNORMAL HIGH (ref 6–23)
Calcium: 8.2 mg/dL — ABNORMAL LOW (ref 8.4–10.5)
GFR calc Af Amer: 31 mL/min — ABNORMAL LOW (ref >90)
GFR calc non Af Amer: 26 mL/min — ABNORMAL LOW (ref >90)
Potassium: 4.4 mEq/L (ref 3.5–5.1)
Sodium: 138 mEq/L (ref 135–145)

## 2013-04-20 LAB — URINE CULTURE: Culture: NO GROWTH

## 2013-04-20 LAB — CBC
MCHC: 34 g/dL (ref 30.0–36.0)
RDW: 15.7 % — ABNORMAL HIGH (ref 11.5–15.5)

## 2013-04-20 NOTE — Progress Notes (Signed)
[facility] PROGRESS NOTE  Samantha Mckenzie YTW:[id number] DOB: 1936-05-10 DOA: 04/19/2013 PCP: Samantha November, MD  Assessment/Plan:  Sepsis -Fever of 103.1 last night -Clinically sepsis appears resolved.  Cellulitis of L arm  -on vancomycin (history of MRSA) - tylenol for fever - Will rule out DVT of LUE with venous doppler (7/11)  DM2  - appears to be diet controlled at this time, not on any home meds  -CBGs reasonable.  HTN  - continue home meds   CKD  - appears to be at baseline -  Continue to monitor while on vanc.   DVT Prophylaxis:  Heparin  Code Status: full Family Communication:  Disposition Plan: Home with appropriate.  inpatient   Antibiotics:  vanc  HPI/Subjective: "they took out 16 lymph nodes"  I've been off treatment since 1998.  This is my 4th episode of cellulitis in my left arm.  Objective: Filed Vitals:   04/19/13 2100 04/19/13 2131 04/19/13 2157 04/20/13 0509  BP: 117/50  107/56 152/62  Pulse:   85 100  Temp:  98 F (36.7 C) 98.6 F (37 C) 98.8 F (37.1 C)  TempSrc:  Oral Oral Oral  Resp: 14  16 18   Height:   5' 3"  (1.6 m)   Weight:   65.8 kg (145 lb 1 oz)   SpO2:   95% 94%    Intake/Output Summary (Last 24 hours) at 04/20/13 1033 Last data filed at 04/20/13 0600  Gross per 24 hour  Intake 783.33 ml  Output      0 ml  Net 783.33 ml   Filed Weights   04/19/13 1822 04/19/13 2157  Weight: 65.772 kg (145 lb) 65.8 kg (145 lb 1 oz)    Exam:   General:  A&O, NAD, Lying comfortably in bed  Cardiovascular: RRR, no murmurs, rubs or gallops, no lower extremity edema  Respiratory: CTA, no wheeze, crackles, or rales.  No increased work of breathing.  Abdomen: Soft, non-tender, non-distended, + bowel sounds, no masses  Musculoskeletal: left arm visibly larger and with erythema compared to right.  There is not a single focus of infection or fluid collection.  Data Reviewed: Basic Metabolic Panel:  Recent Labs Lab  04/19/13 1905 04/20/13 0547  NA 139 138  K 5.1 4.4  CL 102 106  CO2 26 24  GLUCOSE 160* 143*  BUN 46* 39*  CREATININE 2.10* 1.81*  CALCIUM 9.2 8.2*   Liver Function Tests:  Recent Labs Lab 04/19/13 1905  AST 31  ALT 29  ALKPHOS 99  BILITOT 0.3  PROT 8.0  ALBUMIN 3.7   CBC:  Recent Labs Lab 04/19/13 1905 04/20/13 0547  WBC 10.3 10.7*  NEUTROABS 9.4*  --   HGB 14.0 10.0*  HCT 40.0 29.4*  MCV 92.8 92.7  PLT 150 149*     Recent Results (from the past 240 hour(s))  MRSA PCR SCREENING     Status: None   Collection Time    04/19/13 10:18 PM      Result Value Range Status   MRSA by PCR NEGATIVE  NEGATIVE Final   Comment:            The GeneXpert MRSA Assay (FDA     approved for NASAL specimens     only), is one component of a     comprehensive MRSA colonization     surveillance program. It is not     intended to diagnose MRSA     infection nor to guide or  monitor treatment for     MRSA infections.      Scheduled Meds: . allopurinol  100 mg Oral Daily  . amLODipine  5 mg Oral Daily  . aspirin  81 mg Oral Daily  . atorvastatin  20 mg Oral q1800  . escitalopram  10 mg Oral QHS  . gabapentin  100-200 mg Oral QHS  . heparin  5,000 Units Subcutaneous Q8H  . irbesartan  300 mg Oral Daily  . pantoprazole  80 mg Oral Q1200  . sodium chloride  3 mL Intravenous Q12H  . vancomycin  750 mg Intravenous Q24H  . vitamin B-12  1,000 mcg Oral Daily   Continuous Infusions:   Principal Problem:   Cellulitis of left arm Active Problems:   DIABETES MELLITUS, TYPE II   HYPERTENSION   RENAL INSUFFICIENCY   Sepsis    Samantha Mckenzie  [facility] Pager [phone]. If 7PM-7AM, please contact night-coverage at [email or web address], password [facility] 04/20/2013, 10:33 AM  LOS: 1 day

## 2013-04-20 NOTE — Progress Notes (Signed)
Addendum  Patient seen and examined, chart and data base reviewed.  I agree with the above assessment and plan.  For full details please see Mrs. Imogene Burn PA note.  Left upper extremity cellulitis complicating chronic lymphedema.  Check Doppler ultrasound to rule out DVT.   Birdie Hopes, MD [facility] Pager: [phone] 04/20/2013, 11:47 AM

## 2013-04-20 NOTE — Care Management Note (Unsigned)
    Page 1 of 1   04/20/2013     8:44:14 AM   CARE MANAGEMENT NOTE 04/20/2013  Patient:  Samantha Mckenzie, Samantha Mckenzie   Account Number:  [id number]  Date Initiated:  04/20/2013  Documentation initiated by:  Tomi Bamberger  Subjective/Objective Assessment:   dx cellulitis  admit- lives with spouse.     Action/Plan:   Anticipated DC Date:  04/23/2013   Anticipated DC Plan:  [place]  CM consult      Choice offered to / List presented to:             Status of service:  In process, will continue to follow Medicare Important Message given?   (If response is "NO", the following Medicare IM given date fields will be blank) Date Medicare IM given:   Date Additional Medicare IM given:    Discharge Disposition:    Per UR Regulation:  Reviewed for med. necessity/level of care/duration of stay  If discussed at [place] of Stay Meetings, dates discussed:    Comments:  04/20/13 8:43 Tomi Bamberger RN, BSN [phone] patient lives with spouse, NCM will continue to follow for dc needs.

## 2013-04-21 DIAGNOSIS — M79609 Pain in unspecified limb: Secondary | ICD-10-CM

## 2013-04-21 DIAGNOSIS — K219 Gastro-esophageal reflux disease without esophagitis: Secondary | ICD-10-CM | POA: Diagnosis not present

## 2013-04-21 DIAGNOSIS — IMO0002 Reserved for concepts with insufficient information to code with codable children: Secondary | ICD-10-CM | POA: Diagnosis not present

## 2013-04-21 LAB — BASIC METABOLIC PANEL
Calcium: 8.5 mg/dL (ref 8.4–10.5)
Creatinine, Ser: 1.95 mg/dL — ABNORMAL HIGH (ref 0.50–1.10)
GFR calc non Af Amer: 24 mL/min — ABNORMAL LOW (ref >90)
Glucose, Bld: 117 mg/dL — ABNORMAL HIGH (ref 70–99)
Sodium: 139 mEq/L (ref 135–145)

## 2013-04-21 LAB — CBC
Hemoglobin: 9.3 g/dL — ABNORMAL LOW (ref 12.0–15.0)
MCH: 31.2 pg (ref 26.0–34.0)
MCHC: 33.5 g/dL (ref 30.0–36.0)
MCV: 93.3 fL (ref 78.0–100.0)

## 2013-04-21 NOTE — Progress Notes (Signed)
Left upper extremity venous duplex:  No evidence of DVT or superficial thrombosis.

## 2013-04-21 NOTE — Progress Notes (Signed)
[facility] PROGRESS NOTE  Samantha Mckenzie [id number]:[id number] DOB: December 14, 1935 DOA: 04/19/2013 PCP: Kathlene November, MD  HPI/Subjective: Feels much better, denies any fever or chills. Less pain and warmth around her left arm.  Assessment/Plan:  Sepsis -Fever of 103.1 last night -Clinically sepsis appears resolved.  Cellulitis of L arm  -on vancomycin (history of MRSA) - tylenol for fever - LUE venous Doppler showed no evidence of DVT.  DM2  - appears to be diet controlled at this time, not on any home meds  -CBGs reasonable.  HTN  - continue home meds   CKD  - appears to be at baseline -  Continue to monitor while on vanc.   DVT Prophylaxis:  Heparin  Code Status: full Family Communication:  Disposition Plan: Home when appropriate.  inpatient   Antibiotics:  vanc    Objective: Filed Vitals:   04/20/13 0509 04/20/13 1346 04/20/13 2134 04/21/13 0500  BP: 152/62 118/66 163/71 116/54  Pulse: 100 81 83 84  Temp: 98.8 F (37.1 C) 98.1 F (36.7 C) 98.4 F (36.9 C) 98.7 F (37.1 C)  TempSrc: Oral Oral Oral Oral  Resp: 18 18 19 18   Height:      Weight:      SpO2: 94% 99% 94% 97%    Intake/Output Summary (Last 24 hours) at 04/21/13 1310 Last data filed at 04/20/13 1940  Gross per 24 hour  Intake    390 ml  Output      0 ml  Net    390 ml   Filed Weights   04/19/13 1822 04/19/13 2157  Weight: 65.772 kg (145 lb) 65.8 kg (145 lb 1 oz)    Exam:   General:  A&O, NAD, Lying comfortably in bed  Cardiovascular: RRR, no murmurs, rubs or gallops, no lower extremity edema  Respiratory: CTA, no wheeze, crackles, or rales.  No increased work of breathing.  Abdomen: Soft, non-tender, non-distended, + bowel sounds, no masses  Musculoskeletal: left arm visibly larger and with erythema compared to right.  There is not a single focus of infection or fluid collection.  Data Reviewed: Basic Metabolic Panel:  Recent Labs Lab 04/19/13 1905 04/20/13 0547  04/21/13 0500  NA 139 138 139  K 5.1 4.4 4.2  CL 102 106 105  CO2 26 24 28   GLUCOSE 160* 143* 117*  BUN 46* 39* 36*  CREATININE 2.10* 1.81* 1.95*  CALCIUM 9.2 8.2* 8.5   Liver Function Tests:  Recent Labs Lab 04/19/13 1905  AST 31  ALT 29  ALKPHOS 99  BILITOT 0.3  PROT 8.0  ALBUMIN 3.7   CBC:  Recent Labs Lab 04/19/13 1905 04/20/13 0547 04/21/13 0500  WBC 10.3 10.7* 8.1  NEUTROABS 9.4*  --   --   HGB 14.0 10.0* 9.3*  HCT 40.0 29.4* 27.8*  MCV 92.8 92.7 93.3  PLT 150 149* 170     Recent Results (from the past 240 hour(s))  URINE CULTURE     Status: None   Collection Time    04/19/13  8:36 PM      Result Value Range Status   Specimen Description URINE, RANDOM   Final   Special Requests NONE   Final   Culture  Setup Time 04/19/2013 21:33   Final   Colony Count NO GROWTH   Final   Culture NO GROWTH   Final   Report Status 04/20/2013 FINAL   Final  MRSA PCR SCREENING     Status: None   Collection Time  04/19/13 10:18 PM      Result Value Range Status   MRSA by PCR NEGATIVE  NEGATIVE Final   Comment:            The GeneXpert MRSA Assay (FDA     approved for NASAL specimens     only), is one component of a     comprehensive MRSA colonization     surveillance program. It is not     intended to diagnose MRSA     infection nor to guide or     monitor treatment for     MRSA infections.      Scheduled Meds: . allopurinol  100 mg Oral Daily  . amLODipine  5 mg Oral Daily  . aspirin  81 mg Oral Daily  . atorvastatin  20 mg Oral q1800  . escitalopram  10 mg Oral QHS  . gabapentin  100-200 mg Oral QHS  . heparin  5,000 Units Subcutaneous Q8H  . irbesartan  300 mg Oral Daily  . pantoprazole  80 mg Oral Q1200  . sodium chloride  3 mL Intravenous Q12H  . vancomycin  750 mg Intravenous Q24H  . vitamin B-12  1,000 mcg Oral Daily   Continuous Infusions:   Principal Problem:   Cellulitis of left arm Active Problems:   DIABETES MELLITUS, TYPE II    HYPERTENSION   RENAL INSUFFICIENCY   Sepsis    [facility] A  [facility] Pager [phone]. If 7PM-7AM, please contact night-coverage at [email or web address], password [facility] 04/21/2013, 1:10 PM  LOS: 2 days

## 2013-04-22 DIAGNOSIS — D649 Anemia, unspecified: Secondary | ICD-10-CM | POA: Diagnosis not present

## 2013-04-22 DIAGNOSIS — C50919 Malignant neoplasm of unspecified site of unspecified female breast: Secondary | ICD-10-CM | POA: Diagnosis not present

## 2013-04-22 DIAGNOSIS — IMO0002 Reserved for concepts with insufficient information to code with codable children: Secondary | ICD-10-CM | POA: Diagnosis not present

## 2013-04-22 DIAGNOSIS — F329 Major depressive disorder, single episode, unspecified: Secondary | ICD-10-CM | POA: Diagnosis not present

## 2013-04-22 LAB — CBC
Hemoglobin: 9.3 g/dL — ABNORMAL LOW (ref 12.0–15.0)
MCH: 31.6 pg (ref 26.0–34.0)
MCHC: 33.7 g/dL (ref 30.0–36.0)
Platelets: 165 10*3/uL (ref 150–400)
RDW: 15.8 % — ABNORMAL HIGH (ref 11.5–15.5)

## 2013-04-22 LAB — BASIC METABOLIC PANEL
BUN: 35 mg/dL — ABNORMAL HIGH (ref 6–23)
Calcium: 8.8 mg/dL (ref 8.4–10.5)
Creatinine, Ser: 1.94 mg/dL — ABNORMAL HIGH (ref 0.50–1.10)
GFR calc Af Amer: 28 mL/min — ABNORMAL LOW (ref >90)
GFR calc non Af Amer: 24 mL/min — ABNORMAL LOW (ref >90)
Glucose, Bld: 121 mg/dL — ABNORMAL HIGH (ref 70–99)
Potassium: 4.4 mEq/L (ref 3.5–5.1)

## 2013-04-22 NOTE — Progress Notes (Signed)
[facility] PROGRESS NOTE  Samantha Mckenzie IOE:[id number] DOB: Oct 30, 1935 DOA: 04/19/2013 PCP: Kathlene November, MD  HPI/Subjective: Denies any fever or chills. Less redness and hotness, but he still has significant tenderness.  Assessment/Plan:  Sepsis -MAXIMUM TEMPERATURE was 103.148 hours ago, no fever last night -Clinically sepsis appears resolved.  Cellulitis of L arm  -on vancomycin (history of MRSA) - tylenol for fever - LUE venous Doppler showed no evidence of DVT.  DM2  - appears to be diet controlled at this time, not on any home meds  -CBGs reasonable.  HTN  - continue home meds   CKD  - appears to be at baseline -  Continue to monitor while on vanc.   DVT Prophylaxis:  Heparin  Code Status: full Family Communication:  Disposition Plan: Remains inpatient, likely to be discharged in a.m.   Antibiotics:  vanc    Objective: Filed Vitals:   04/21/13 1347 04/21/13 2143 04/21/13 2148 04/22/13 0605  BP: 113/57 157/71 154/70 115/64  Pulse: 79 73  78  Temp: 98.5 F (36.9 C) 98.2 F (36.8 C)  98.7 F (37.1 C)  TempSrc: Oral Oral  Oral  Resp: 18 18  17   Height:      Weight:      SpO2: 95% 95%  95%    Intake/Output Summary (Last 24 hours) at 04/22/13 1236 Last data filed at 04/21/13 2116  Gross per 24 hour  Intake    150 ml  Output      0 ml  Net    150 ml   Filed Weights   04/19/13 1822 04/19/13 2157  Weight: 65.772 kg (145 lb) 65.8 kg (145 lb 1 oz)    Exam:   General:  A&O, NAD, Lying comfortably in bed  Cardiovascular: RRR, no murmurs, rubs or gallops, no lower extremity edema  Respiratory: CTA, no wheeze, crackles, or rales.  No increased work of breathing.  Abdomen: Soft, non-tender, non-distended, + bowel sounds, no masses  Musculoskeletal: left arm visibly larger and with erythema compared to right.  There is not a single focus of infection or fluid collection.  Data Reviewed: Basic Metabolic Panel:  Recent Labs Lab  04/19/13 1905 04/20/13 0547 04/21/13 0500 04/22/13 0430  NA 139 138 139 136  K 5.1 4.4 4.2 4.4  CL 102 106 105 102  CO2 26 24 28 26   GLUCOSE 160* 143* 117* 121*  BUN 46* 39* 36* 35*  CREATININE 2.10* 1.81* 1.95* 1.94*  CALCIUM 9.2 8.2* 8.5 8.8   Liver Function Tests:  Recent Labs Lab 04/19/13 1905  AST 31  ALT 29  ALKPHOS 99  BILITOT 0.3  PROT 8.0  ALBUMIN 3.7   CBC:  Recent Labs Lab 04/19/13 1905 04/20/13 0547 04/21/13 0500 04/22/13 0430  WBC 10.3 10.7* 8.1 6.7  NEUTROABS 9.4*  --   --   --   HGB 14.0 10.0* 9.3* 9.3*  HCT 40.0 29.4* 27.8* 27.6*  MCV 92.8 92.7 93.3 93.9  PLT 150 149* 170 165     Recent Results (from the past 240 hour(s))  CULTURE, BLOOD (ROUTINE X 2)     Status: None   Collection Time    04/19/13  7:00 PM      Result Value Range Status   Specimen Description BLOOD ARM RIGHT   Final   Special Requests BOTTLES DRAWN AEROBIC AND ANAEROBIC 5CC   Final   Culture  Setup Time 04/20/2013 01:20   Final   Culture  Final   Value:        BLOOD CULTURE RECEIVED NO GROWTH TO DATE CULTURE WILL BE HELD FOR 5 DAYS BEFORE ISSUING A FINAL NEGATIVE REPORT   Report Status PENDING   Incomplete  CULTURE, BLOOD (ROUTINE X 2)     Status: None   Collection Time    04/19/13  7:23 PM      Result Value Range Status   Specimen Description BLOOD HAND RIGHT   Final   Special Requests BOTTLES DRAWN AEROBIC ONLY 10CC   Final   Culture  Setup Time 04/20/2013 01:20   Final   Culture     Final   Value:        BLOOD CULTURE RECEIVED NO GROWTH TO DATE CULTURE WILL BE HELD FOR 5 DAYS BEFORE ISSUING A FINAL NEGATIVE REPORT   Report Status PENDING   Incomplete  URINE CULTURE     Status: None   Collection Time    04/19/13  8:36 PM      Result Value Range Status   Specimen Description URINE, RANDOM   Final   Special Requests NONE   Final   Culture  Setup Time 04/19/2013 21:33   Final   Colony Count NO GROWTH   Final   Culture NO GROWTH   Final   Report Status  04/20/2013 FINAL   Final  MRSA PCR SCREENING     Status: None   Collection Time    04/19/13 10:18 PM      Result Value Range Status   MRSA by PCR NEGATIVE  NEGATIVE Final   Comment:            The GeneXpert MRSA Assay (FDA     approved for NASAL specimens     only), is one component of a     comprehensive MRSA colonization     surveillance program. It is not     intended to diagnose MRSA     infection nor to guide or     monitor treatment for     MRSA infections.      Scheduled Meds: . allopurinol  100 mg Oral Daily  . amLODipine  5 mg Oral Daily  . aspirin  81 mg Oral Daily  . atorvastatin  20 mg Oral q1800  . escitalopram  10 mg Oral QHS  . gabapentin  100-200 mg Oral QHS  . heparin  5,000 Units Subcutaneous Q8H  . irbesartan  300 mg Oral Daily  . pantoprazole  80 mg Oral Q1200  . sodium chloride  3 mL Intravenous Q12H  . vancomycin  750 mg Intravenous Q24H  . vitamin B-12  1,000 mcg Oral Daily   Continuous Infusions:   Principal Problem:   Cellulitis of left arm Active Problems:   DIABETES MELLITUS, TYPE II   HYPERTENSION   RENAL INSUFFICIENCY   Sepsis    [facility] A  [facility] Pager [phone]. If 7PM-7AM, please contact night-coverage at [email or web address], password [facility] 04/22/2013, 12:36 PM  LOS: 3 days

## 2013-04-23 DIAGNOSIS — E785 Hyperlipidemia, unspecified: Secondary | ICD-10-CM | POA: Diagnosis not present

## 2013-04-23 DIAGNOSIS — C50919 Malignant neoplasm of unspecified site of unspecified female breast: Secondary | ICD-10-CM | POA: Diagnosis not present

## 2013-04-23 DIAGNOSIS — IMO0002 Reserved for concepts with insufficient information to code with codable children: Secondary | ICD-10-CM | POA: Diagnosis not present

## 2013-04-23 DIAGNOSIS — E119 Type 2 diabetes mellitus without complications: Secondary | ICD-10-CM | POA: Diagnosis not present

## 2013-04-23 LAB — BASIC METABOLIC PANEL
Calcium: 8.8 mg/dL (ref 8.4–10.5)
GFR calc Af Amer: 29 mL/min — ABNORMAL LOW (ref >90)
GFR calc non Af Amer: 25 mL/min — ABNORMAL LOW (ref >90)
Potassium: 4.5 mEq/L (ref 3.5–5.1)
Sodium: 140 mEq/L (ref 135–145)

## 2013-04-23 MED ORDER — DOXYCYCLINE HYCLATE 50 MG PO CAPS: 50.0000 mg | ORAL_CAPSULE | Freq: Two times a day (BID) | ORAL | Status: DC

## 2013-04-23 NOTE — Discharge Summary (Signed)
Physician Discharge Summary  Samantha Mckenzie [id number]:[id number] DOB: June 10, 1936 DOA: 04/19/2013  PCP: Kathlene November, MD  Admit date: 04/19/2013 Discharge date: 04/23/2013  Time spent: 35 minutes  Recommendations for Outpatient Follow-up:  1. PCP follow up in 1 week to monitor left arm for any evidence of cellulitis.  Discharge Diagnoses:  Principal Problem:   Cellulitis of left arm Active Problems:   DIABETES MELLITUS, TYPE II   HYPERTENSION   RENAL INSUFFICIENCY   Sepsis   Discharge Condition: stable and much improved.  Diet recommendation: Heart Healthy  Filed Weights   04/19/13 1822 04/19/13 2157  Weight: 65.772 kg (145 lb) 65.8 kg (145 lb 1 oz)    History of present illness:  Samantha Mckenzie is a 77 y.o. female who presents to the ED with c/o fever, chills, rigors, L arm pain, erythema. Symptoms onset this morning, and have been persistant throughout the day. This occurs in the context of chronic LUE lymphedema secondary to mastectomy for BRCA in 1999 (no recurrence). She has had multiple episodes of recurrent cellulitis in the LUE because of this in the past. This is her fourth episode.  In ED she was started on vancomycin for cellulitis, fever 103.1, tachycardia 125, tachycardia resolved with IVF and fever improved with tylenol. Hospitalist asked to admit.   Hospital Course:  Sepsis  -MAXIMUM TEMPERATURE was 103. Afebrile for 48+  hours  -Clinically sepsis appears resolved.   Cellulitis of L arm  - Treated with vancomycin (history of MRSA) as an inpatient. Discharged on Doxycycline for a total of 14 days of antibiotic therapy - LUE venous Doppler showed no evidence of DVT.   DM2  - appears to be diet controlled at this time, not on any home meds  -CBGs reasonable.   HTN  - continue home meds   CKD  - appears to be at baseline     Discharge Exam: Filed Vitals:   04/22/13 0605 04/22/13 1517 04/22/13 2033 04/23/13 0515  BP: 115/64 126/61 149/70 131/66  Pulse: 78 77  75 75  Temp: 98.7 F (37.1 C) 98 F (36.7 C) 98.5 F (36.9 C) 98.8 F (37.1 C)  TempSrc: Oral Oral Oral Oral  Resp: 17 18 18 18   Height:      Weight:      SpO2: 95% 94% 96% 93%    General: A&O, Very pleasant, appears well Cardiovascular: rrr, no m/r/g Respiratory: cta no w/c/r Abdomen:  Soft, nt, nd, +Bs, no masses Extremities:  Left arm erythema resolved.  Swelling improved.  Discharge Instructions      Discharge Orders   Future Appointments Provider Department Dept Phone   05/30/2013 10:30 AM Colon Branch, MD [place] at  [place]   Future Orders Complete By Expires     Diet - low sodium heart healthy  As directed     Increase activity slowly  As directed         Medication List         allopurinol 100 MG tablet  Commonly known as:  ZYLOPRIM  Take 100 mg by mouth daily.     amLODipine 5 MG tablet  Commonly known as:  NORVASC  Take 5 mg by mouth daily.     aspirin 81 MG tablet  Take 81 mg by mouth daily.     atorvastatin 20 MG tablet  Commonly known as:  LIPITOR  Take 20 mg by mouth daily.     CENTRUM SILVER PO  Take 1 tablet  by mouth daily.     doxycycline 50 MG capsule  Commonly known as:  VIBRAMYCIN  Take 1 capsule (50 mg total) by mouth 2 (two) times daily.     escitalopram 10 MG tablet  Commonly known as:  LEXAPRO  Take 10 mg by mouth at bedtime.     esomeprazole 40 MG capsule  Commonly known as:  NEXIUM  Take 40 mg by mouth daily before breakfast.     gabapentin 100 MG capsule  Commonly known as:  NEURONTIN  Take 100-200 mg by mouth at bedtime.     valsartan 320 MG tablet  Commonly known as:  DIOVAN  Take 160 mg by mouth daily.     vitamin B-12 1000 MCG tablet  Commonly known as:  CYANOCOBALAMIN  Take 1,000 mcg by mouth daily.     Vitamin D3 1000 UNITS Caps  Take 1 capsule by mouth daily.       Allergies  Allergen Reactions  . Codeine Nausea And Vomiting  . Morphine Nausea And Vomiting  .  Oxycodone-Acetaminophen Nausea And Vomiting  . Sulfonamide Derivatives Nausea And Vomiting  . Tramadol Hcl Nausea And Vomiting   Follow-up Information   Follow up with Kathlene November, MD. Schedule an appointment as soon as possible for a visit in 7 days.   Contact information:   [facility] [address] [address] [place] [address] [phone]        The results of significant diagnostics from this hospitalization (including imaging, microbiology, ancillary and laboratory) are listed below for reference.    Significant Diagnostic Studies: No results found.  Microbiology: Recent Results (from the past 240 hour(s))  CULTURE, BLOOD (ROUTINE X 2)     Status: None   Collection Time    04/19/13  7:00 PM      Result Value Range Status   Specimen Description BLOOD ARM RIGHT   Final   Special Requests BOTTLES DRAWN AEROBIC AND ANAEROBIC 5CC   Final   Culture  Setup Time 04/20/2013 01:20   Final   Culture     Final   Value:        BLOOD CULTURE RECEIVED NO GROWTH TO DATE CULTURE WILL BE HELD FOR 5 DAYS BEFORE ISSUING A FINAL NEGATIVE REPORT   Report Status PENDING   Incomplete  CULTURE, BLOOD (ROUTINE X 2)     Status: None   Collection Time    04/19/13  7:23 PM      Result Value Range Status   Specimen Description BLOOD HAND RIGHT   Final   Special Requests BOTTLES DRAWN AEROBIC ONLY 10CC   Final   Culture  Setup Time 04/20/2013 01:20   Final   Culture     Final   Value:        BLOOD CULTURE RECEIVED NO GROWTH TO DATE CULTURE WILL BE HELD FOR 5 DAYS BEFORE ISSUING A FINAL NEGATIVE REPORT   Report Status PENDING   Incomplete  URINE CULTURE     Status: None   Collection Time    04/19/13  8:36 PM      Result Value Range Status   Specimen Description URINE, RANDOM   Final   Special Requests NONE   Final   Culture  Setup Time 04/19/2013 21:33   Final   Colony Count NO GROWTH   Final   Culture NO GROWTH   Final   Report Status 04/20/2013 FINAL   Final  MRSA PCR SCREENING  Status: None   Collection Time    04/19/13 10:18 PM      Result Value Range Status   MRSA by PCR NEGATIVE  NEGATIVE Final   Comment:            The GeneXpert MRSA Assay (FDA     approved for NASAL specimens     only), is one component of a     comprehensive MRSA colonization     surveillance program. It is not     intended to diagnose MRSA     infection nor to guide or     monitor treatment for     MRSA infections.     Labs: Basic Metabolic Panel:  Recent Labs Lab 04/19/13 1905 04/20/13 0547 04/21/13 0500 04/22/13 0430 04/23/13 0510  NA 139 138 139 136 140  K 5.1 4.4 4.2 4.4 4.5  CL 102 106 105 102 104  CO2 26 24 28 26 29   GLUCOSE 160* 143* 117* 121* 116*  BUN 46* 39* 36* 35* 35*  CREATININE 2.10* 1.81* 1.95* 1.94* 1.90*  CALCIUM 9.2 8.2* 8.5 8.8 8.8   Liver Function Tests:  Recent Labs Lab 04/19/13 1905  AST 31  ALT 29  ALKPHOS 99  BILITOT 0.3  PROT 8.0  ALBUMIN 3.7   CBC:  Recent Labs Lab 04/19/13 1905 04/20/13 0547 04/21/13 0500 04/22/13 0430  WBC 10.3 10.7* 8.1 6.7  NEUTROABS 9.4*  --   --   --   HGB 14.0 10.0* 9.3* 9.3*  HCT 40.0 29.4* 27.8* 27.6*  MCV 92.8 92.7 93.3 93.9  PLT 150 149* 170 165     Signed:  YorkBobby Rumpf, PA-C  [facility] 04/23/2013, 3:32 PM

## 2013-04-23 NOTE — Discharge Summary (Signed)
Addendum  Patient seen and examined, chart and data base reviewed.  I agree with the above assessment and discharge plan.  For full details please see Mrs. Imogene Burn PA note.  Left upper extremity cellulitis and setting of lymphedema.  Discharge on doxycycline for 10 days.   Birdie Hopes, MD [facility] Pager: [phone] 04/23/2013, 3:35 PM

## 2013-04-23 NOTE — Progress Notes (Signed)
Chevis Pretty to be D/C'd Home per MD order.  Discussed with the patient and all questions fully answered.    Medication List         allopurinol 100 MG tablet  Commonly known as:  ZYLOPRIM  Take 100 mg by mouth daily.     amLODipine 5 MG tablet  Commonly known as:  NORVASC  Take 5 mg by mouth daily.     aspirin 81 MG tablet  Take 81 mg by mouth daily.     atorvastatin 20 MG tablet  Commonly known as:  LIPITOR  Take 20 mg by mouth daily.     CENTRUM SILVER PO  Take 1 tablet by mouth daily.     doxycycline 50 MG capsule  Commonly known as:  VIBRAMYCIN  Take 1 capsule (50 mg total) by mouth 2 (two) times daily.     escitalopram 10 MG tablet  Commonly known as:  LEXAPRO  Take 10 mg by mouth at bedtime.     esomeprazole 40 MG capsule  Commonly known as:  NEXIUM  Take 40 mg by mouth daily before breakfast.     gabapentin 100 MG capsule  Commonly known as:  NEURONTIN  Take 100-200 mg by mouth at bedtime.     valsartan 320 MG tablet  Commonly known as:  DIOVAN  Take 160 mg by mouth daily.     vitamin B-12 1000 MCG tablet  Commonly known as:  CYANOCOBALAMIN  Take 1,000 mcg by mouth daily.     Vitamin D3 1000 UNITS Caps  Take 1 capsule by mouth daily.        VVS, Skin clean, dry and intact without evidence of skin break down, no evidence of skin tears noted. IV catheter discontinued intact. Site without signs and symptoms of complications. Dressing and pressure applied.  An After Visit Summary was printed and given to the patient. Follow up appointments , new prescriptions and medication administration times given. Handout given and discussed on cellulitis Patient escorted via [place], and D/C home via private auto.  Park Breed, RN 04/23/2013 8:00 PM

## 2013-04-26 ENCOUNTER — Telehealth: Payer: Self-pay | Admitting: Internal Medicine

## 2013-04-26 LAB — CULTURE, BLOOD (ROUTINE X 2): Culture: NO GROWTH

## 2013-04-26 NOTE — Telephone Encounter (Signed)
Yes, next week

## 2013-04-26 NOTE — Telephone Encounter (Signed)
She has an apt. On 05/30/13 does she need to be seen sooner?

## 2013-04-26 NOTE — Telephone Encounter (Signed)
Patient was recently admitted to the hospital, needs followup within few days, please be sure she has an appointment.

## 2013-04-26 NOTE — Telephone Encounter (Signed)
thx

## 2013-04-26 NOTE — Telephone Encounter (Signed)
Spoke with her husband, Jailee Jaquez and scheduled apt for 05/04/13 at 2:30

## 2013-05-04 ENCOUNTER — Ambulatory Visit (INDEPENDENT_AMBULATORY_CARE_PROVIDER_SITE_OTHER): Payer: Medicare Other | Admitting: Internal Medicine

## 2013-05-04 VITALS — BP 140/50 | HR 66 | Temp 97.7°F | Wt 144.8 lb

## 2013-05-04 DIAGNOSIS — D649 Anemia, unspecified: Secondary | ICD-10-CM

## 2013-05-04 DIAGNOSIS — IMO0002 Reserved for concepts with insufficient information to code with codable children: Secondary | ICD-10-CM

## 2013-05-04 DIAGNOSIS — R42 Dizziness and giddiness: Secondary | ICD-10-CM

## 2013-05-04 MED ORDER — DOXYCYCLINE HYCLATE 100 MG PO TABS: 100.0000 mg | ORAL_TABLET | Freq: Two times a day (BID) | ORAL | Status: DC

## 2013-05-04 NOTE — Progress Notes (Signed)
  Subjective:    Patient ID: Samantha Mckenzie, female    DOB: [birth year], 77 y.o.   MRN: [id number]  HPI Hospital followup Was admitted to with left arm cellulitis:  discharge summary, labs, x-rays reviewed and summarized in the assessment and plan.  Also, had 2 episodes of dizziness 3 and 2 months ago.Reports that the dizziness felt like she was "falling to one side", it lasted a few minutes, was not associated with double vision, slurred speech, motor deficits, face paresthesias, nausea or headaches. Also denies chest pain or palpitations.  Past Medical History: h/o breast cancer--- surgery,chemo, XRT; had peripheral neuropathy (imbalance at times) after chemo Diabetes mellitus, type II Gout Hyperlipidemia Hypertension Renal insufficiency Osteoarthritis H/o anemia   GERD EGD 02-2007-- gastritis GERD Carotid u/s Nl 11-07 (had a carotid bruit) Allergic rhinitis   Past Surgical History: Mastectomy AND [id number]) open Lung Bx (1999) ----> neg   Social History:   Married   husband w/ several medical problems   2 children, 2 gk   tobacco-- never   ETOH-- rarely   diet-- healthy   exercise-- not exercising    Review of Systems Since he left the hospital she is doing well, she got her last doxycycline 50 mg tablet today. No fever. Appetite is good. No nausea, vomiting, diarrhea. Arm looks almost back to baseline.     Objective:   Physical Exam  Skin:      BP 140/50  Pulse 66  Temp(Src) 97.7 F (36.5 C) (Oral)  Wt 144 lb 12.8 oz (65.681 kg)  BMI 25.66 kg/m2  SpO2 94%  General -- alert, well-developed, NAD .   Neck -- normal carotid pulse Lungs -- normal respiratory effort, no intercostal retractions, no accessory muscle use, and normal breath sounds.   Heart-- normal rate, regular rhythm, no murmur, and no gallop.   Extremities-- no pretibial edema bilaterally Neurologic-- alert & oriented X3 ; Speech clear, pupils equal and reactive, EOMI, motor are  intact, gait normal.  Psych-- Cognition and judgment appear intact. Alert and cooperative with normal attention span and concentration.  not anxious appearing and not depressed appearing.       Assessment & Plan:

## 2013-05-04 NOTE — Patient Instructions (Addendum)
take doxycycline 5 additional days Please come back in 4 weeks for a routine checkup. Call anytime if you have fever, chills, the arm start swelling again or looks red and warm.

## 2013-05-06 ENCOUNTER — Encounter: Payer: Self-pay | Admitting: Internal Medicine

## 2013-05-06 DIAGNOSIS — R42 Dizziness and giddiness: Secondary | ICD-10-CM | POA: Insufficient documentation

## 2013-05-06 NOTE — Assessment & Plan Note (Signed)
  2 episodes of dizziness, see history of present illness; She has multiple cardiovascular risk factors consequently a TIA is always in the differential however her neurological exam normal, normal carotid pulses an there was no associated neurological symptoms. If she has more symptoms or a severe episode needs to let me know or go to the ER. In the meantime We'll continue controlling her cardiovascular risk factors and consider  further eval.

## 2013-05-06 NOTE — Assessment & Plan Note (Signed)
  S/p admission from 7-10 to 04-23-2013 left arm cellulitis, this is~ the fourth episode of cellulitis, Was treated with IV vancomycin and discharge and doxycycline. Ultrasound was negative for left upper extremity DVT. Creatinine was stable at 1.9, LFTs normal. Hemoglobin upon admission was 14, on discharge 9.3. Platelets in the 150, 160, lower than baseline Blood cultures negative x2 patient seems to be improving but there is one area of the arm that feel slightly warm. I'm going to continue with doxycycline for 5 additional days 100 mg twice a day.

## 2013-05-06 NOTE — Assessment & Plan Note (Signed)
last hemoglobin of the hospital slightly lower than baseline, platelets also slt decreased; we'll recheck on return to the office in one month.

## 2013-05-16 ENCOUNTER — Other Ambulatory Visit: Payer: Self-pay | Admitting: Internal Medicine

## 2013-05-16 NOTE — Telephone Encounter (Signed)
Ok to refill gabapentin? Last OV 05/04/13 Last filled 04/10/13 with #60 and no refills.

## 2013-05-17 NOTE — Telephone Encounter (Signed)
Refill done per orders.

## 2013-05-17 NOTE — Telephone Encounter (Signed)
Ok 60, 5 RF

## 2013-05-30 ENCOUNTER — Encounter: Payer: Self-pay | Admitting: Internal Medicine

## 2013-05-30 ENCOUNTER — Ambulatory Visit (INDEPENDENT_AMBULATORY_CARE_PROVIDER_SITE_OTHER): Payer: Medicare Other | Admitting: Internal Medicine

## 2013-05-30 VITALS — BP 130/60 | HR 57 | Temp 97.8°F | Ht 63.4 in | Wt 144.8 lb

## 2013-05-30 DIAGNOSIS — I10 Essential (primary) hypertension: Secondary | ICD-10-CM

## 2013-05-30 DIAGNOSIS — Z13820 Encounter for screening for osteoporosis: Secondary | ICD-10-CM

## 2013-05-30 DIAGNOSIS — F329 Major depressive disorder, single episode, unspecified: Secondary | ICD-10-CM | POA: Diagnosis not present

## 2013-05-30 DIAGNOSIS — F32A Depression, unspecified: Secondary | ICD-10-CM

## 2013-05-30 DIAGNOSIS — E119 Type 2 diabetes mellitus without complications: Secondary | ICD-10-CM

## 2013-05-30 DIAGNOSIS — Z0000 Encounter for general adult medical examination without abnormal findings: Secondary | ICD-10-CM | POA: Diagnosis not present

## 2013-05-30 DIAGNOSIS — M79609 Pain in unspecified limb: Secondary | ICD-10-CM

## 2013-05-30 DIAGNOSIS — D649 Anemia, unspecified: Secondary | ICD-10-CM

## 2013-05-30 DIAGNOSIS — M109 Gout, unspecified: Secondary | ICD-10-CM

## 2013-05-30 DIAGNOSIS — E785 Hyperlipidemia, unspecified: Secondary | ICD-10-CM | POA: Diagnosis not present

## 2013-05-30 LAB — LDL CHOLESTEROL, DIRECT: Direct LDL: 78.6 mg/dL

## 2013-05-30 LAB — CBC WITH DIFFERENTIAL/PLATELET
Basophils Relative: 0.4 % (ref 0.0–3.0)
Eosinophils Absolute: 0.1 10*3/uL (ref 0.0–0.7)
Hemoglobin: 10.4 g/dL — ABNORMAL LOW (ref 12.0–15.0)
MCHC: 33.3 g/dL (ref 30.0–36.0)
MCV: 97.1 fl (ref 78.0–100.0)
Monocytes Absolute: 0.4 10*3/uL (ref 0.1–1.0)
Neutro Abs: 2.9 10*3/uL (ref 1.4–7.7)
RBC: 3.22 Mil/uL — ABNORMAL LOW (ref 3.87–5.11)

## 2013-05-30 LAB — LIPID PANEL: HDL: 37.7 mg/dL — ABNORMAL LOW (ref >39.00)

## 2013-05-30 LAB — URIC ACID: Uric Acid, Serum: 6.5 mg/dL (ref 2.4–7.0)

## 2013-05-30 LAB — HEMOGLOBIN A1C: Hgb A1c MFr Bld: 6.5 % (ref 4.6–6.5)

## 2013-05-30 MED ORDER — ZOSTER VACCINE LIVE 19400 UNT/0.65ML ~~LOC~~ SOLR: 0.6500 mL | Freq: Once | SUBCUTANEOUS | Status: DC

## 2013-05-30 NOTE — Assessment & Plan Note (Signed)
Doing well, not taking lexapro, med list corrected

## 2013-05-30 NOTE — Assessment & Plan Note (Signed)
Due for labs , cont lipitor

## 2013-05-30 NOTE — Patient Instructions (Addendum)
Get your blood work before you leave  Next visit in  4-5 months for a routine office visit Please make an appointment before you leave the office today (or call few weeks in advance) ----    Fall Prevention and Home Safety Falls cause injuries and can affect all age groups. It is possible to use preventive measures to significantly decrease the likelihood of falls. There are many simple measures which can make your home safer and prevent falls. OUTDOORS  Repair cracks and edges of walkways and driveways.  Remove high doorway thresholds.  Trim shrubbery on the main path into your home.  Have good outside lighting.  Clear walkways of tools, rocks, debris, and clutter.  Check that handrails are not broken and are securely fastened. Both sides of steps should have handrails.  Have leaves, snow, and ice cleared regularly.  Use sand or salt on walkways during winter months.  In the garage, clean up grease or oil spills. BATHROOM  Install night lights.  Install grab bars by the toilet and in the tub and shower.  Use non-skid mats or decals in the tub or shower.  Place a plastic non-slip stool in the shower to sit on, if needed.  Keep floors dry and clean up all water on the floor immediately.  Remove soap buildup in the tub or shower on a regular basis.  Secure bath mats with non-slip, double-sided rug tape.  Remove throw rugs and tripping hazards from the floors. BEDROOMS  Install night lights.  Make sure a bedside light is easy to reach.  Do not use oversized bedding.  Keep a telephone by your bedside.  Have a firm chair with side arms to use for getting dressed.  Remove throw rugs and tripping hazards from the floor. KITCHEN  Keep handles on pots and pans turned toward the center of the stove. Use back burners when possible.  Clean up spills quickly and allow time for drying.  Avoid walking on wet floors.  Avoid hot utensils and knives.  Position  shelves so they are not too high or low.  Place commonly used objects within easy reach.  If necessary, use a sturdy step stool with a grab bar when reaching.  Keep electrical cables out of the way.  Do not use floor polish or wax that makes floors slippery. If you must use wax, use non-skid floor wax.  Remove throw rugs and tripping hazards from the floor. STAIRWAYS  Never leave objects on stairs.  Place handrails on both sides of stairways and use them. Fix any loose handrails. Make sure handrails on both sides of the stairways are as long as the stairs.  Check carpeting to make sure it is firmly attached along stairs. Make repairs to worn or loose carpet promptly.  Avoid placing throw rugs at the top or bottom of stairways, or properly secure the rug with carpet tape to prevent slippage. Get rid of throw rugs, if possible.  Have an electrician put in a light switch at the top and bottom of the stairs. OTHER FALL PREVENTION TIPS  Wear low-heel or rubber-soled shoes that are supportive and fit well. Wear closed toe shoes.  When using a stepladder, make sure it is fully opened and both spreaders are firmly locked. Do not climb a closed stepladder.  Add color or contrast paint or tape to grab bars and handrails in your home. Place contrasting color strips on first and last steps.  Learn and use mobility aids as needed.  Install an electrical emergency response system.  Turn on lights to avoid dark areas. Replace light bulbs that burn out immediately. Get light switches that glow.  Arrange furniture to create clear pathways. Keep furniture in the same place.  Firmly attach carpet with non-skid or double-sided tape.  Eliminate uneven floor surfaces.  Select a carpet pattern that does not visually hide the edge of steps.  Be aware of all pets. OTHER HOME SAFETY TIPS  Set the water temperature for 120 F (48.8 C).  Keep emergency numbers on or near the telephone.  Keep  smoke detectors on every level of the home and near sleeping areas. Document Released: 09/17/2002 Document Revised: 03/28/2012 Document Reviewed: 12/17/2011 [facility] Patient Information 2014 [place].

## 2013-05-30 NOTE — Assessment & Plan Note (Signed)
Check a u acid, cont allopurinol

## 2013-05-30 NOTE — Progress Notes (Signed)
Subjective:    Patient ID: Samantha Mckenzie, female    DOB: 23-Oct-1935, 77 y.o.   MRN: [id number]  HPI Here for Medicare AWV:  1. Risk factors based on Past M, S, F history: reviewed  2. Physical Activities: occ takes a walk, 1-2 miles 3. Depression/mood: neg screening   4. Hearing: No problems noted or reported  5. ADL's: Independent, still drives  6. Fall Risk: no recent problems , see instructions  7. home Safety: does feelsafe at home  8. Height, weight, &visual acuity: see VS, h/o macular degeneration, sees Dr Baird Cancer routinely  9. Counseling: provided  10. Labs ordered based on risk factors: if needed  11. Referral Coordination: if needed  12. Care Plan, see assessment and plan  13. Cognitive Assessment: Motor skills  seem appropriate for age, cognition normal   In addition, today we discussed the following: Hypertension, good medication compliance, ambulatory BPs 130/60, she takes  Metoprolol half tablet daily in addition to other medications. Leg pain-- RLS? On Neurontin, doing great. High cholesterol, good medication compliance, no apparent side effects. At some point she was taking Lexapro, not taking any longer, not anxious or depressed at all.   Past Medical History  Diagnosis Date  . Breast CA     surgery, chemo, XRT; had peripheral neuropathy (imbalance at times) after chemo  . Anemia   . Gout   . Hyperlipidemia   . Hypertension   . Renal insufficiency   . Osteoarthritis   . GERD (gastroesophageal reflux disease)     gastritis, EGD 02/2007  . Allergic rhinitis   . Depression   . Macular degeneration 2011  . Cellulitis     Left arm, recurrent    Past Surgical History  Procedure Laterality Date  . Mastectomy  1999    and lymphnodes   . Lung biopsy  1999    neg  . Cataracts bilaterally  1 -2012   Family History  Problem Relation Age of Onset  . Breast cancer Other     ? of   . Colon cancer Neg Hx   . Heart attack Father 79  . Diabetes Sister     2   . Hypertension Sister    History   Social History  . Marital Status: Married    Spouse Name: N/A    Number of Children: 2  . Years of Education: N/A   Occupational History  . retired     Social History Main Topics  . Smoking status: Never Smoker   . Smokeless tobacco: Never Used  . Alcohol Use: 0.0 oz/week    0 Glasses of wine per week     Comment: rarely  . Drug Use: No  . Sexual Activity: Not on file   Other Topics Concern  . Not on file   Social History Narrative   Lives w/ husband , 2 children in [place], 2 [place]               Review of Systems Chest pain or shortness or breath No  nausea, vomiting, diarrhea. No blood in the stools. No fever or chills. No dysuria or vaginal bleeding.     Objective:   Physical Exam BP 130/60  Pulse 57  Temp(Src) 97.8 F (36.6 C)  Ht 5' 3.4" (1.61 m)  Wt 144 lb 12.8 oz (65.681 kg)  BMI 25.34 kg/m2  SpO2 98% General -- alert, well-developed, NAD.  Neck --no thyromegaly , normal carotid pulse  Breast--declined  Lungs --  normal respiratory effort, no intercostal retractions, no accessory muscle use, and normal breath sounds.  Heart-- normal rate, regular rhythm, no murmur.  Abdomen-- Not distended, Good bowel sounds,soft, non-tender.No mass Extremities-- no pretibial edema bilaterally ; Walks with some difficulty but appropriate for age. Neurologic-- alert & oriented X3. Speech, gait normal.  Psych-- Cognition and judgment appear intact. Alert and cooperative with normal attention span and concentration. not anxious appearing and not depressed appearing.      Assessment & Plan:

## 2013-05-30 NOTE — Assessment & Plan Note (Signed)
Sx well controlled w/ neurontinn, no change

## 2013-05-30 NOTE — Assessment & Plan Note (Signed)
Good compliance w/ medication, she takes metoprolol half tablet daily. Metoprolol was added to her medication list. Last BMP stable. No change

## 2013-05-30 NOTE — Assessment & Plan Note (Signed)
On no medications, taking diovan, check the A1c

## 2013-05-30 NOTE — Assessment & Plan Note (Signed)
Labs

## 2013-05-30 NOTE — Assessment & Plan Note (Addendum)
Last Tetanus 2005 Last Pneumovax:  2005, 2011 shingles shot Rx was provided, never used, Rx reissued  PAP: saw Dr Ubaldo Glassing the last time 2011, was told no further PAPs R MMG (-) 2013, does one every year Breast exam today-- declined, does SBE and is normal    DEXAs per gyn , Dr Ubaldo Glassing told pt all DEXAs were neg, last ~ 3 years ago, rx one today   Had a  Colonoscopy 2001 and 2012 (tics), next  Per GI Diet exercise discussed

## 2013-05-31 ENCOUNTER — Encounter: Payer: Self-pay | Admitting: Internal Medicine

## 2013-06-04 ENCOUNTER — Telehealth: Payer: Self-pay | Admitting: Internal Medicine

## 2013-06-04 ENCOUNTER — Encounter: Payer: Self-pay | Admitting: General Practice

## 2013-06-04 NOTE — Telephone Encounter (Signed)
06/04/2013  Pt called, returning Stephanie's call from Friday.  Looked in chart notes but I did not see where Colletta [place] had called.  When I put pt on hold, she got disconnected.  bw

## 2013-06-04 NOTE — Telephone Encounter (Signed)
Spoke with pt advised that her bone density was trying to be scheduled. Also gave her a copy of her labs.

## 2013-06-19 ENCOUNTER — Other Ambulatory Visit: Payer: Self-pay | Admitting: Internal Medicine

## 2013-06-19 NOTE — Telephone Encounter (Signed)
rx refilled per protocol. DJR

## 2013-07-05 ENCOUNTER — Other Ambulatory Visit: Payer: Self-pay | Admitting: Internal Medicine

## 2013-07-06 ENCOUNTER — Other Ambulatory Visit: Payer: Self-pay | Admitting: *Deleted

## 2013-07-06 DIAGNOSIS — E785 Hyperlipidemia, unspecified: Secondary | ICD-10-CM

## 2013-07-06 MED ORDER — ATORVASTATIN CALCIUM 20 MG PO TABS: 20.0000 mg | ORAL_TABLET | Freq: Every day | ORAL | Status: DC

## 2013-07-06 NOTE — Telephone Encounter (Signed)
Refill for lipitor sent to [facility]

## 2013-07-17 ENCOUNTER — Other Ambulatory Visit: Payer: Self-pay

## 2013-07-17 DIAGNOSIS — Z9012 Acquired absence of left breast and nipple: Secondary | ICD-10-CM

## 2013-07-17 DIAGNOSIS — Z1231 Encounter for screening mammogram for malignant neoplasm of breast: Secondary | ICD-10-CM

## 2013-07-31 ENCOUNTER — Other Ambulatory Visit: Payer: Self-pay | Admitting: Internal Medicine

## 2013-07-31 DIAGNOSIS — H35379 Puckering of macula, unspecified eye: Secondary | ICD-10-CM | POA: Diagnosis not present

## 2013-07-31 DIAGNOSIS — H43819 Vitreous degeneration, unspecified eye: Secondary | ICD-10-CM | POA: Diagnosis not present

## 2013-07-31 DIAGNOSIS — H40059 Ocular hypertension, unspecified eye: Secondary | ICD-10-CM | POA: Diagnosis not present

## 2013-07-31 NOTE — Telephone Encounter (Signed)
Amlodipine refill sent to pharmacy

## 2013-08-20 DIAGNOSIS — H35349 Macular cyst, hole, or pseudohole, unspecified eye: Secondary | ICD-10-CM | POA: Diagnosis not present

## 2013-08-20 DIAGNOSIS — H35379 Puckering of macula, unspecified eye: Secondary | ICD-10-CM | POA: Diagnosis not present

## 2013-08-21 DIAGNOSIS — H35379 Puckering of macula, unspecified eye: Secondary | ICD-10-CM | POA: Diagnosis not present

## 2013-08-22 ENCOUNTER — Ambulatory Visit: Payer: Medicare Other

## 2013-09-25 ENCOUNTER — Ambulatory Visit
Admission: RE | Admit: 2013-09-25 | Discharge: 2013-09-25 | Disposition: A | Discharge status: Home / Self Care | Payer: Medicare Other | Source: Ambulatory Visit

## 2013-09-25 DIAGNOSIS — Z9012 Acquired absence of left breast and nipple: Secondary | ICD-10-CM

## 2013-09-25 DIAGNOSIS — Z1231 Encounter for screening mammogram for malignant neoplasm of breast: Secondary | ICD-10-CM | POA: Diagnosis not present

## 2013-10-01 DIAGNOSIS — H35379 Puckering of macula, unspecified eye: Secondary | ICD-10-CM | POA: Diagnosis not present

## 2013-10-11 ENCOUNTER — Other Ambulatory Visit: Payer: Self-pay | Admitting: Internal Medicine

## 2013-10-12 DIAGNOSIS — N184 Chronic kidney disease, stage 4 (severe): Secondary | ICD-10-CM | POA: Diagnosis not present

## 2013-10-12 DIAGNOSIS — E785 Hyperlipidemia, unspecified: Secondary | ICD-10-CM | POA: Diagnosis not present

## 2013-10-12 DIAGNOSIS — M109 Gout, unspecified: Secondary | ICD-10-CM | POA: Diagnosis not present

## 2013-10-12 DIAGNOSIS — I129 Hypertensive chronic kidney disease with stage 1 through stage 4 chronic kidney disease, or unspecified chronic kidney disease: Secondary | ICD-10-CM | POA: Diagnosis not present

## 2013-10-12 NOTE — Telephone Encounter (Signed)
Amlodipine refilled per protocol. JG//CMA

## 2013-11-10 ENCOUNTER — Other Ambulatory Visit: Payer: Self-pay | Admitting: Internal Medicine

## 2013-11-12 NOTE — Telephone Encounter (Signed)
Gabapentin refilled per protocol. JG//CMA

## 2013-11-12 NOTE — Telephone Encounter (Signed)
Allopurinol refilled per protocol. JG//CMA

## 2014-01-07 ENCOUNTER — Other Ambulatory Visit: Payer: Self-pay | Admitting: Internal Medicine

## 2014-01-15 DIAGNOSIS — H3530 Unspecified macular degeneration: Secondary | ICD-10-CM | POA: Diagnosis not present

## 2014-01-15 DIAGNOSIS — T1510XA Foreign body in conjunctival sac, unspecified eye, initial encounter: Secondary | ICD-10-CM | POA: Diagnosis not present

## 2014-01-23 ENCOUNTER — Other Ambulatory Visit: Payer: Self-pay | Admitting: Internal Medicine

## 2014-01-23 NOTE — Telephone Encounter (Signed)
Rx sent to the pharmacy by e-script.//AB/CMA 

## 2014-03-05 ENCOUNTER — Other Ambulatory Visit: Payer: Self-pay | Admitting: Internal Medicine

## 2014-03-08 ENCOUNTER — Encounter: Payer: Self-pay | Admitting: Family Medicine

## 2014-03-08 ENCOUNTER — Ambulatory Visit (INDEPENDENT_AMBULATORY_CARE_PROVIDER_SITE_OTHER): Payer: Medicare Other | Admitting: Family Medicine

## 2014-03-08 VITALS — BP 124/78 | HR 66 | Temp 98.2°F | Resp 16 | Wt 144.1 lb

## 2014-03-08 DIAGNOSIS — R5381 Other malaise: Secondary | ICD-10-CM

## 2014-03-08 DIAGNOSIS — R5383 Other fatigue: Secondary | ICD-10-CM | POA: Diagnosis not present

## 2014-03-08 DIAGNOSIS — M1990 Unspecified osteoarthritis, unspecified site: Secondary | ICD-10-CM

## 2014-03-08 DIAGNOSIS — E785 Hyperlipidemia, unspecified: Secondary | ICD-10-CM | POA: Diagnosis not present

## 2014-03-08 DIAGNOSIS — IMO0001 Reserved for inherently not codable concepts without codable children: Secondary | ICD-10-CM | POA: Diagnosis not present

## 2014-03-08 LAB — HEPATIC FUNCTION PANEL
ALT: 21 U/L (ref 0–35)
AST: 23 U/L (ref 0–37)
Albumin: 3.5 g/dL (ref 3.5–5.2)
Alkaline Phosphatase: 58 U/L (ref 39–117)
BILIRUBIN DIRECT: 0.1 mg/dL (ref 0.0–0.3)
BILIRUBIN TOTAL: 0.4 mg/dL (ref 0.2–1.2)
TOTAL PROTEIN: 7.1 g/dL (ref 6.0–8.3)

## 2014-03-08 LAB — CBC WITH DIFFERENTIAL/PLATELET
BASOS PCT: 0.4 % (ref 0.0–3.0)
Basophils Absolute: 0 10*3/uL (ref 0.0–0.1)
EOS PCT: 2.2 % (ref 0.0–5.0)
Eosinophils Absolute: 0.1 10*3/uL (ref 0.0–0.7)
HEMATOCRIT: 31.8 % — AB (ref 36.0–46.0)
HEMOGLOBIN: 10.5 g/dL — AB (ref 12.0–15.0)
LYMPHS ABS: 1.7 10*3/uL (ref 0.7–4.0)
LYMPHS PCT: 31 % (ref 12.0–46.0)
MCHC: 33.1 g/dL (ref 30.0–36.0)
MCV: 97.1 fl (ref 78.0–100.0)
MONOS PCT: 6.9 % (ref 3.0–12.0)
Monocytes Absolute: 0.4 10*3/uL (ref 0.1–1.0)
NEUTROS ABS: 3.3 10*3/uL (ref 1.4–7.7)
Neutrophils Relative %: 59.5 % (ref 43.0–77.0)
Platelets: 197 10*3/uL (ref 150.0–400.0)
RBC: 3.28 Mil/uL — AB (ref 3.87–5.11)
RDW: 15.8 % — ABNORMAL HIGH (ref 11.5–15.5)
WBC: 5.5 10*3/uL (ref 4.0–10.5)

## 2014-03-08 LAB — BASIC METABOLIC PANEL
BUN: 42 mg/dL — AB (ref 6–23)
CHLORIDE: 105 meq/L (ref 96–112)
CO2: 28 meq/L (ref 19–32)
CREATININE: 2.4 mg/dL — AB (ref 0.4–1.2)
Calcium: 9.1 mg/dL (ref 8.4–10.5)
GFR: 20.79 mL/min — ABNORMAL LOW (ref >60.00)
Glucose, Bld: 105 mg/dL — ABNORMAL HIGH (ref 70–99)
POTASSIUM: 4.7 meq/L (ref 3.5–5.1)
Sodium: 140 mEq/L (ref 135–145)

## 2014-03-08 LAB — CK: Total CK: 69 U/L (ref 7–177)

## 2014-03-08 LAB — LIPID PANEL
CHOL/HDL RATIO: 4
Cholesterol: 166 mg/dL (ref 0–200)
HDL: 44.8 mg/dL (ref >39.00)
LDL CALC: 80 mg/dL (ref 0–99)
Triglycerides: 205 mg/dL — ABNORMAL HIGH (ref 0.0–149.0)
VLDL: 41 mg/dL — ABNORMAL HIGH (ref 0.0–40.0)

## 2014-03-08 NOTE — Assessment & Plan Note (Signed)
Chronic problem.  Given pt's muscular complaints, concern for possible statin induced myalgias.  Check labs.  Will hold statin if needed.  Pt expressed understanding and is in agreement w/ plan.

## 2014-03-08 NOTE — Assessment & Plan Note (Signed)
Chronic problem.  Continue tylenol prn.  Ice or heat prn.  Pt expressed understanding and is in agreement w/ plan.

## 2014-03-08 NOTE — Patient Instructions (Signed)
Follow up as needed Continue to take the tylenol regularly for arthritis pain Alternate heat and ice for pain relief We'll notify you of your lab results and make any changes if needed Call with any questions or concerns Hang in there!!!

## 2014-03-08 NOTE — Progress Notes (Signed)
Pre visit review using our clinic review tool, if applicable. No additional management support is needed unless otherwise documented below in the visit note. 

## 2014-03-08 NOTE — Progress Notes (Signed)
   Subjective:    Patient ID: Samantha Mckenzie, female    DOB: 1936/05/03, 78 y.o.   MRN: [id number]  HPI Bilateral upper arm pain for 'quite some time'.  Thought it was arthritis 'or the weather' but it's not getting better.  Husband thinks that she 'over does it'.  On statin.  + fatigue- hx of similar.  Hand and feet cramping- pt has known arthritis in hands.  Taking tylenol w/ minimal relief.  Has hx of renal insufficiency and is unable to take NSAIDs.   Review of Systems No CP, SOB, HAs, abd pain, N/V, fevers.    Objective:   Physical Exam  Vitals reviewed. Constitutional: She is oriented to person, place, and time. She appears well-developed and well-nourished. No distress.  Appears younger than stated age  HENT:  Head: Normocephalic and atraumatic.  Cardiovascular: Normal rate, regular rhythm, normal heart sounds and intact distal pulses.   Pulmonary/Chest: Effort normal and breath sounds normal. No respiratory distress. She has no wheezes. She has no rales.  Abdominal: Soft. Bowel sounds are normal. She exhibits no distension. There is no tenderness. There is no rebound.  Musculoskeletal: She exhibits tenderness (over upper arms bilaterally). She exhibits no edema.  Obvious OA of R index finger and thumb  Neurological: She is alert and oriented to person, place, and time. No cranial nerve deficit. Coordination normal.  Skin: Skin is warm and dry.  Psychiatric: She has a normal mood and affect. Her behavior is normal.          Assessment & Plan:

## 2014-03-08 NOTE — Assessment & Plan Note (Signed)
New to provider, recurrent for pt.  Husband feels strongly that pt 'over does it'.  Check labs to r/o metabolic abnormality, infection, or anemia.  Will follow.

## 2014-03-08 NOTE — Assessment & Plan Note (Signed)
New.  Concern for possible statin induced myalgias.  Check CK level.  Will hold statin if levels are elevated.  Pt expressed understanding and is in agreement w/ plan.

## 2014-03-20 ENCOUNTER — Other Ambulatory Visit: Payer: Self-pay | Admitting: Internal Medicine

## 2014-04-04 ENCOUNTER — Other Ambulatory Visit: Payer: Self-pay | Admitting: Internal Medicine

## 2014-04-05 ENCOUNTER — Other Ambulatory Visit: Payer: Self-pay | Admitting: Internal Medicine

## 2014-04-18 ENCOUNTER — Telehealth: Payer: Self-pay | Admitting: *Deleted

## 2014-04-18 NOTE — Telephone Encounter (Signed)
Gabapentin 100 mg  Last OV- 05/30/13  Last refilled- 11/10/13 # 60 / 5 rf  UDS- none

## 2014-04-19 MED ORDER — GABAPENTIN 100 MG PO CAPS: 100.0000 mg | ORAL_CAPSULE | Freq: Every day | ORAL | Status: DC

## 2014-04-19 NOTE — Telephone Encounter (Signed)
rx faxed to [facility]

## 2014-04-19 NOTE — Telephone Encounter (Signed)
Gabapentin doesn't require a UDS RF x 1 year

## 2014-04-29 ENCOUNTER — Telehealth: Payer: Self-pay | Admitting: Internal Medicine

## 2014-04-29 MED ORDER — GABAPENTIN 100 MG PO CAPS: 100.0000 mg | ORAL_CAPSULE | Freq: Every day | ORAL | Status: DC

## 2014-04-29 NOTE — Telephone Encounter (Signed)
Done . Pt notified.

## 2014-04-29 NOTE — Telephone Encounter (Signed)
Caller name: Shaily Relation to pt: patient Call back number: [phone] Pharmacy: [facility]   Reason for call: Patient called to request a refill for Gabapentin 100 mg to [facility]. Patient would like a phone call when this is done. Please advise.

## 2014-05-16 ENCOUNTER — Other Ambulatory Visit: Payer: Self-pay | Admitting: Internal Medicine

## 2014-05-25 ENCOUNTER — Other Ambulatory Visit: Payer: Self-pay | Admitting: Internal Medicine

## 2014-05-28 ENCOUNTER — Encounter: Payer: Self-pay | Admitting: Internal Medicine

## 2014-05-28 ENCOUNTER — Ambulatory Visit (INDEPENDENT_AMBULATORY_CARE_PROVIDER_SITE_OTHER): Payer: Medicare Other | Admitting: Internal Medicine

## 2014-05-28 VITALS — BP 146/64 | HR 67 | Temp 97.9°F | Wt 143.5 lb

## 2014-05-28 DIAGNOSIS — N259 Disorder resulting from impaired renal tubular function, unspecified: Secondary | ICD-10-CM

## 2014-05-28 DIAGNOSIS — R5383 Other fatigue: Principal | ICD-10-CM

## 2014-05-28 DIAGNOSIS — R5381 Other malaise: Secondary | ICD-10-CM

## 2014-05-28 DIAGNOSIS — E119 Type 2 diabetes mellitus without complications: Secondary | ICD-10-CM

## 2014-05-28 DIAGNOSIS — E785 Hyperlipidemia, unspecified: Secondary | ICD-10-CM

## 2014-05-28 DIAGNOSIS — E538 Deficiency of other specified B group vitamins: Secondary | ICD-10-CM

## 2014-05-28 DIAGNOSIS — F329 Major depressive disorder, single episode, unspecified: Secondary | ICD-10-CM

## 2014-05-28 DIAGNOSIS — F32A Depression, unspecified: Secondary | ICD-10-CM

## 2014-05-28 DIAGNOSIS — F3289 Other specified depressive episodes: Secondary | ICD-10-CM

## 2014-05-28 NOTE — Assessment & Plan Note (Signed)
High cholesterol, continue with aches and pains, likely DJD but I recommend to hold Lipitor for 3 weeks and see if she improves

## 2014-05-28 NOTE — Assessment & Plan Note (Signed)
Chronic renal insufficiency, Anemia  To see her nephrologist This week, thus will not check  BMP or CBC today

## 2014-05-28 NOTE — Assessment & Plan Note (Signed)
Depression, counseled, declined medications

## 2014-05-28 NOTE — Assessment & Plan Note (Signed)
Diabetes, due for a A1c.

## 2014-05-28 NOTE — Patient Instructions (Addendum)
Get your blood work before you leave , you also need a I37 and Folic Acid   Hold atorvastatin for 3 weeks, then restart the medicine; see if that help the aches and pains you have  Next visit is for a physical exam in 6 weeks,   fasting Please make an appointment    Please tell Dr Moshe Cipro  I'm not checking a BMP-CBC or iron

## 2014-05-28 NOTE — Progress Notes (Signed)
Subjective:    Patient ID: Samantha Mckenzie, female    DOB: January 17, 1936, 78 y.o.   MRN: [id number]  DOS:  05/28/2014 Type of visit - description: acute  History: Today, she complains of fatigue for the last month, on chart review she had the same symptoms about 3 months ago, labs were done, results reviewed, see assessment and plan. Describe her fatigue as  lack of energy after activities of daily living; despite that she is able to sometimes take a 2 mile walk if the weather is good. When asked, admits to some depression, lost her brother last week. Continue with ache and pains, on Lipitor, mostly are pains in the hands, occasionally has true myalgias but they are infrequent  ROS Denies fever, chills, weight loss. Appetite is normal No chest pain, dyspnea on exertion, shortness of breath, orthopnea. Occasionally has mild lower extremity edema at the end of the day No suicidal ideas, no headaches  Past Medical History  Diagnosis Date  . Breast CA     surgery, chemo, XRT; had peripheral neuropathy (imbalance at times) after chemo  . Anemia   . Gout   . Hyperlipidemia   . Hypertension   . Renal insufficiency   . Osteoarthritis   . GERD (gastroesophageal reflux disease)     gastritis, EGD 02/2007  . Allergic rhinitis   . Depression   . Macular degeneration 2011  . Cellulitis     Left arm, recurrent     Past Surgical History  Procedure Laterality Date  . Mastectomy  1999    and lymphnodes   . Lung biopsy  1999    neg  . Cataracts bilaterally  1 -2012    History   Social History  . Marital Status: Married    Spouse Name: N/A    Number of Children: 2  . Years of Education: N/A   Occupational History  . retired     Social History Main Topics  . Smoking status: Never Smoker   . Smokeless tobacco: Never Used  . Alcohol Use: 0.0 oz/week    0 Glasses of wine per week     Comment: rarely  . Drug Use: No  . Sexual Activity: Not on file   Other Topics Concern  . Not  on file   Social History Narrative   Lives w/ husband , 2 children in [place], 2 [place]                Medication List       This list is accurate as of: 05/28/14  6:40 PM.  Always use your most recent med list.               allopurinol 100 MG tablet  Commonly known as:  ZYLOPRIM  TAKE 1 TABLET DAILY     amLODipine 5 MG tablet  Commonly known as:  NORVASC  TAKE 1 TABLET DAILY (DUE FOR OFFICE VISIT AT DR Larose Kells)     aspirin 81 MG tablet  Take 81 mg by mouth daily.     atorvastatin 20 MG tablet  Commonly known as:  LIPITOR  TAKE 1 TABLET DAILY     CENTRUM SILVER PO  Take 1 tablet by mouth daily.     esomeprazole 40 MG capsule  Commonly known as:  NEXIUM  Take 40 mg by mouth daily before breakfast.     gabapentin 100 MG capsule  Commonly known as:  NEURONTIN  Take 1-2 capsules (100-200 mg total) by mouth  at bedtime.     metoprolol 50 MG tablet  Commonly known as:  LOPRESSOR  TAKE 1 TABLET DAILY (DUE FOR OFFICE VISIT WITH DR. Dorota Heinrichs, PLEASE SCHEDULE [phone])     valsartan 320 MG tablet  Commonly known as:  DIOVAN  Take 160 mg by mouth daily.     vitamin B-12 1000 MCG tablet  Commonly known as:  CYANOCOBALAMIN  Take 1,000 mcg by mouth daily.     Vitamin D3 1000 UNITS Caps  Take 1 capsule by mouth daily.           Objective:   Physical Exam BP 146/64  Pulse 67  Temp(Src) 97.9 F (36.6 C) (Oral)  Wt 143 lb 8 oz (65.091 kg)  SpO2 97%  General -- alert, well-developed, NAD.  Neck --no thyromegaly  HEENT-- Not pale.  Lungs -- normal respiratory effort, no intercostal retractions, no accessory muscle use, and normal breath sounds.  Heart-- normal rate, regular rhythm, no murmur.   Extremities-- no pretibial edema bilaterally ; Hands and wrists without synovitis Neurologic--  alert & oriented X3. Speech normal, gait appropriate for age, strength symmetric and appropriate for age.  Psych-- Cognition and judgment appear intact. Cooperative with normal  attention span and concentration. No anxious or depressed appearing except when we talked about her brother .      Assessment & Plan:

## 2014-05-28 NOTE — Assessment & Plan Note (Signed)
Fatigue, Labs 3 months ago shows stable anemia and worsening kidney function. The fatigue is likely multifactorial including renal disease and depression. Plan: Check O70 and folic acid, reassess on return to the office

## 2014-05-28 NOTE — Progress Notes (Signed)
Pre-visit discussion using our clinic review tool. No additional management support is needed unless otherwise documented below in the visit note.  

## 2014-05-29 LAB — VITAMIN B12: VITAMIN B 12: 1065 pg/mL — AB (ref 211–911)

## 2014-05-29 LAB — HEMOGLOBIN A1C: Hgb A1c MFr Bld: 6.7 % — ABNORMAL HIGH (ref 4.6–6.5)

## 2014-05-29 LAB — FOLATE

## 2014-05-30 DIAGNOSIS — E119 Type 2 diabetes mellitus without complications: Secondary | ICD-10-CM | POA: Diagnosis not present

## 2014-05-30 DIAGNOSIS — I10 Essential (primary) hypertension: Secondary | ICD-10-CM | POA: Diagnosis not present

## 2014-05-30 DIAGNOSIS — M109 Gout, unspecified: Secondary | ICD-10-CM | POA: Diagnosis not present

## 2014-05-30 DIAGNOSIS — N183 Chronic kidney disease, stage 3 unspecified: Secondary | ICD-10-CM | POA: Diagnosis not present

## 2014-05-30 DIAGNOSIS — E785 Hyperlipidemia, unspecified: Secondary | ICD-10-CM | POA: Diagnosis not present

## 2014-05-30 DIAGNOSIS — I129 Hypertensive chronic kidney disease with stage 1 through stage 4 chronic kidney disease, or unspecified chronic kidney disease: Secondary | ICD-10-CM | POA: Diagnosis not present

## 2014-05-30 DIAGNOSIS — N184 Chronic kidney disease, stage 4 (severe): Secondary | ICD-10-CM | POA: Diagnosis not present

## 2014-06-26 ENCOUNTER — Telehealth: Payer: Self-pay | Admitting: *Deleted

## 2014-06-26 NOTE — Telephone Encounter (Signed)
Pt was scheduled today for BP check/diabetic bundle.  She states when she talked to someone on the phone and told them she wanted to have her physical done.  Pt states whoever she talked to checked and told her that would be fine.  So when she came in today, she was expecting to get her physical.  Pt said that was just another mistake on our part.  She said she was not seeing the nurse today, and would call if she needed anything.  Dustin and I encouraged her to go ahead and schedule her physical, letting her know the earliest appointment at this time for physical with Larose Kells was in December.  She refused to make the physical appointment at this time, stating she has never had to wait that long.  At that point, pt left.

## 2014-07-21 ENCOUNTER — Other Ambulatory Visit: Payer: Self-pay | Admitting: Internal Medicine

## 2014-07-27 ENCOUNTER — Other Ambulatory Visit: Payer: Self-pay | Admitting: Internal Medicine

## 2014-08-05 ENCOUNTER — Encounter: Payer: Medicare Other | Admitting: Internal Medicine

## 2014-08-07 ENCOUNTER — Encounter: Payer: Self-pay | Admitting: Internal Medicine

## 2014-08-07 ENCOUNTER — Other Ambulatory Visit: Payer: Self-pay | Admitting: Internal Medicine

## 2014-08-07 ENCOUNTER — Ambulatory Visit (INDEPENDENT_AMBULATORY_CARE_PROVIDER_SITE_OTHER): Payer: Medicare Other | Admitting: Internal Medicine

## 2014-08-07 VITALS — BP 118/62 | HR 61 | Temp 98.4°F | Wt 142.4 lb

## 2014-08-07 DIAGNOSIS — F329 Major depressive disorder, single episode, unspecified: Secondary | ICD-10-CM

## 2014-08-07 DIAGNOSIS — E785 Hyperlipidemia, unspecified: Secondary | ICD-10-CM

## 2014-08-07 DIAGNOSIS — R5383 Other fatigue: Secondary | ICD-10-CM | POA: Diagnosis not present

## 2014-08-07 LAB — CBC WITH DIFFERENTIAL/PLATELET
BASOS PCT: 0.5 % (ref 0.0–3.0)
Basophils Absolute: 0 10*3/uL (ref 0.0–0.1)
EOS PCT: 1.4 % (ref 0.0–5.0)
Eosinophils Absolute: 0.1 10*3/uL (ref 0.0–0.7)
HEMATOCRIT: 34 % — AB (ref 36.0–46.0)
HEMOGLOBIN: 11.3 g/dL — AB (ref 12.0–15.0)
Lymphocytes Relative: 29.5 % (ref 12.0–46.0)
Lymphs Abs: 1.7 10*3/uL (ref 0.7–4.0)
MCHC: 33.2 g/dL (ref 30.0–36.0)
MCV: 95.8 fl (ref 78.0–100.0)
MONO ABS: 0.4 10*3/uL (ref 0.1–1.0)
Monocytes Relative: 7.5 % (ref 3.0–12.0)
NEUTROS ABS: 3.6 10*3/uL (ref 1.4–7.7)
NEUTROS PCT: 61.1 % (ref 43.0–77.0)
Platelets: 225 10*3/uL (ref 150.0–400.0)
RBC: 3.55 Mil/uL — AB (ref 3.87–5.11)
RDW: 15.6 % — ABNORMAL HIGH (ref 11.5–15.5)
WBC: 5.9 10*3/uL (ref 4.0–10.5)

## 2014-08-07 LAB — BASIC METABOLIC PANEL
BUN: 41 mg/dL — ABNORMAL HIGH (ref 6–23)
CHLORIDE: 102 meq/L (ref 96–112)
CO2: 20 mEq/L (ref 19–32)
CREATININE: 2.1 mg/dL — AB (ref 0.4–1.2)
Calcium: 9.1 mg/dL (ref 8.4–10.5)
GFR: 23.82 mL/min — ABNORMAL LOW (ref >60.00)
Glucose, Bld: 106 mg/dL — ABNORMAL HIGH (ref 70–99)
POTASSIUM: 4.7 meq/L (ref 3.5–5.1)
SODIUM: 137 meq/L (ref 135–145)

## 2014-08-07 LAB — TSH: TSH: 3.26 u[IU]/mL (ref 0.35–4.50)

## 2014-08-07 MED ORDER — FLUOXETINE HCL 20 MG PO TABS: 20.0000 mg | ORAL_TABLET | Freq: Every day | ORAL | Status: DC

## 2014-08-07 NOTE — Assessment & Plan Note (Addendum)
Continue with fatigue. Labs reviewed: A1c 2 months ago satisfactory, normal B12, hemoglobin 10.5 on May 2015 which is stable. No cardiopulmonary symptoms. PHQ 9 scored 13, mild-to-moderate depression. Suspect fatigue is multifactorial,  Depression probably playing a dominant role (pt agreed w/ me, she got emotional, her husband is sick and she is unable to do much b/c takes care of him) Plan: CBC, BMP, vitamin D, TSH Treat  Depression, discussed counseling and SSRI-- will start w/ prozac, see instructions

## 2014-08-07 NOTE — Progress Notes (Signed)
Pre visit review using our clinic review tool, if applicable. No additional management support is needed unless otherwise documented below in the visit note. 

## 2014-08-07 NOTE — Assessment & Plan Note (Signed)
Since the last visit, she stopped Lipitor, aches and pain at the upper extremity improved but continue with right leg discomfort sometimes. Plan: Restart Lipitor, see if  pain resurface

## 2014-08-07 NOTE — Progress Notes (Signed)
Subjective:    Patient ID: Samantha Mckenzie, female    DOB: Mar 13, 1936, 78 y.o.   MRN: [id number]  DOS:  08/07/2014 Type of visit - description : acute Interval history: Continue with fatigue, symptoms started approximately July 2015: During the morning she is able to do her activities of daily living and even take a walk without problems but consistently in the afternoons she is very tired, lacks energy. Also reports she is quite irritable, when I asked to describe this symptom she said "I don't feel like doing anything".  High cholesterol, off Lipitor since last office visit,  pains at the arms improved, still has occasional pain on the right leg pain, no claudication per se    ROS Denies chest pain,DOE, lower extremity edema or palpitations. Denies  suicidal ideas, not feeling withdraw  from her family or friends. She has 2 children in [place], does not see them as often as she would like to.   Past Medical History  Diagnosis Date  . Breast CA     surgery, chemo, XRT; had peripheral neuropathy (imbalance at times) after chemo  . Anemia   . Gout   . Hyperlipidemia   . Hypertension   . Renal insufficiency   . Osteoarthritis   . GERD (gastroesophageal reflux disease)     gastritis, EGD 02/2007  . Allergic rhinitis   . Depression   . Macular degeneration 2011  . Cellulitis     Left arm, recurrent     Past Surgical History  Procedure Laterality Date  . Mastectomy  1999    and lymphnodes   . Lung biopsy  1999    neg  . Cataracts bilaterally  1 -2012    History   Social History  . Marital Status: Married    Spouse Name: N/A    Number of Children: 2  . Years of Education: N/A   Occupational History  . retired     Social History Main Topics  . Smoking status: Never Smoker   . Smokeless tobacco: Never Used  . Alcohol Use: 0.0 oz/week    0 Glasses of wine per week     Comment: rarely  . Drug Use: No  . Sexual Activity: Not on file   Other Topics Concern    . Not on file   Social History Narrative   Lives w/ husband , 2 children in [place], 2 [place]                Medication List       This list is accurate as of: 08/07/14  6:54 PM.  Always use your most recent med list.               allopurinol 100 MG tablet  Commonly known as:  ZYLOPRIM  TAKE 1 TABLET DAILY     amLODipine 5 MG tablet  Commonly known as:  NORVASC  Take 1 tablet daily.     aspirin 81 MG tablet  Take 81 mg by mouth daily.     atorvastatin 20 MG tablet  Commonly known as:  LIPITOR  Take 1 tablet daily.     CENTRUM SILVER PO  Take 1 tablet by mouth daily.     esomeprazole 40 MG capsule  Commonly known as:  NEXIUM  Take 40 mg by mouth daily before breakfast.     FLUoxetine 20 MG tablet  Commonly known as:  PROZAC  Take 1 tablet (20 mg total) by mouth daily.  gabapentin 100 MG capsule  Commonly known as:  NEURONTIN  Take 1-2 capsules (100-200 mg total) by mouth at bedtime.     metoprolol 50 MG tablet  Commonly known as:  LOPRESSOR  TAKE 1 TABLET DAILY (DUE FOR OFFICE VISIT WITH DR. Jonuel Butterfield, PLEASE SCHEDULE [phone])     valsartan 320 MG tablet  Commonly known as:  DIOVAN  Take 160 mg by mouth daily.     vitamin B-12 1000 MCG tablet  Commonly known as:  CYANOCOBALAMIN  Take 1,000 mcg by mouth daily.     Vitamin D3 1000 UNITS Caps  Take 1 capsule by mouth daily.           Objective:   Physical Exam BP 118/62  Pulse 61  Temp(Src) 98.4 F (36.9 C) (Oral)  Wt 142 lb 6 oz (64.581 kg)  SpO2 99% General -- alert, well-developed, NAD.  Neck --no thyromegaly  HEENT-- Not pale.   Lungs -- normal respiratory effort, no intercostal retractions, no accessory muscle use, and normal breath sounds.  Heart-- normal rate, regular rhythm, no murmur.  Abdomen-- Not distended, good bowel sounds,soft, non-tender.  Extremities-- no pretibial edema bilaterally ; normal femoral-pedal pulse Neurologic--  alert & oriented X3. Speech normal, gait  appropriate for age, strength symmetric and appropriate for age.  Psych-- Cognition and judgment appear intact. Cooperative with normal attention span and concentration. No anxious or depressed appearing.     Assessment & Plan:

## 2014-08-07 NOTE — Patient Instructions (Signed)
Get your blood work before you leave  \ START FLUOXETINE 20 MG: 1/2 TABLET THE FIRST 10 DAYS, THEN 1 TABLET EVERY MORNING   Please come back to the office IN 4 WEEKS  for a routine check up

## 2014-08-08 ENCOUNTER — Other Ambulatory Visit: Payer: Medicare Other

## 2014-08-19 ENCOUNTER — Other Ambulatory Visit: Payer: Self-pay

## 2014-08-19 DIAGNOSIS — Z1231 Encounter for screening mammogram for malignant neoplasm of breast: Secondary | ICD-10-CM

## 2014-08-27 ENCOUNTER — Other Ambulatory Visit: Payer: Self-pay | Admitting: Internal Medicine

## 2014-09-09 ENCOUNTER — Ambulatory Visit: Payer: Medicare Other | Admitting: Internal Medicine

## 2014-09-26 ENCOUNTER — Ambulatory Visit
Admission: RE | Admit: 2014-09-26 | Discharge: 2014-09-26 | Disposition: A | Discharge status: Home / Self Care | Payer: Medicare Other | Source: Ambulatory Visit

## 2014-09-26 DIAGNOSIS — Z1231 Encounter for screening mammogram for malignant neoplasm of breast: Secondary | ICD-10-CM | POA: Diagnosis not present

## 2014-10-09 ENCOUNTER — Ambulatory Visit (INDEPENDENT_AMBULATORY_CARE_PROVIDER_SITE_OTHER): Payer: Medicare Other | Admitting: Internal Medicine

## 2014-10-09 ENCOUNTER — Encounter: Payer: Self-pay | Admitting: Internal Medicine

## 2014-10-09 VITALS — BP 134/71 | HR 86 | Temp 98.2°F | Ht 62.0 in | Wt 141.1 lb

## 2014-10-09 DIAGNOSIS — F32A Depression, unspecified: Secondary | ICD-10-CM

## 2014-10-09 DIAGNOSIS — E785 Hyperlipidemia, unspecified: Secondary | ICD-10-CM

## 2014-10-09 DIAGNOSIS — R5383 Other fatigue: Secondary | ICD-10-CM

## 2014-10-09 DIAGNOSIS — E119 Type 2 diabetes mellitus without complications: Secondary | ICD-10-CM

## 2014-10-09 DIAGNOSIS — M150 Primary generalized (osteo)arthritis: Secondary | ICD-10-CM | POA: Diagnosis not present

## 2014-10-09 DIAGNOSIS — Z0000 Encounter for general adult medical examination without abnormal findings: Secondary | ICD-10-CM | POA: Diagnosis not present

## 2014-10-09 DIAGNOSIS — Z23 Encounter for immunization: Secondary | ICD-10-CM | POA: Diagnosis not present

## 2014-10-09 DIAGNOSIS — F329 Major depressive disorder, single episode, unspecified: Secondary | ICD-10-CM | POA: Diagnosis not present

## 2014-10-09 DIAGNOSIS — N959 Unspecified menopausal and perimenopausal disorder: Secondary | ICD-10-CM

## 2014-10-09 DIAGNOSIS — M159 Polyosteoarthritis, unspecified: Secondary | ICD-10-CM

## 2014-10-09 DIAGNOSIS — I10 Essential (primary) hypertension: Secondary | ICD-10-CM

## 2014-10-09 LAB — TSH: TSH: 3.62 u[IU]/mL (ref 0.35–4.50)

## 2014-10-09 LAB — HEMOGLOBIN A1C: HEMOGLOBIN A1C: 6.8 % — AB (ref 4.6–6.5)

## 2014-10-09 MED ORDER — METOPROLOL TARTRATE 25 MG PO TABS: 25.0000 mg | ORAL_TABLET | Freq: Every day | ORAL | Status: DC

## 2014-10-09 NOTE — Assessment & Plan Note (Signed)
Last BMP satisfactory, continue with Diovan, amlodipine and metoprolol. She takes metoprolol 25 mg, will send a new prescription.

## 2014-10-09 NOTE — Assessment & Plan Note (Signed)
See previous entry, labs were okay, now feels better

## 2014-10-09 NOTE — Progress Notes (Signed)
Subjective:    Patient ID: Samantha Mckenzie, female    DOB: Nov 17, 1935, 78 y.o.   MRN: [id number]  DOS:  10/09/2014 Type of visit - description :   Here for Medicare AWV:   1. Risk factors based on Past M, S, F history: reviewed   2. Physical Activities: occ takes a walk, 1-2 miles depending on the weather  3. Depression/mood:  Last ov was rx ssri for depression, did not feel well w/ meds, currently feels ok emotionally 4. Hearing: No problems noted or reported   5. ADL's: Independent, still drives   6. Fall Risk: no recent problems , see instructions   7. home Safety: does feel safe at home   8. Height, weight, &visual acuity: see VS, reports that eventually  macular degeneration was r/o, used to see Dr Baird Cancer -----------will refer to ophthalmology 9. Counseling: provided   10. Labs ordered based on risk factors: if needed   11. Referral Coordination: if needed   12. Care Plan, see assessment and plan   13. Cognitive Assessment: Motor skills  seem appropriate for age, cognition normal  14. Care team updated-- will refer to ophtalmology 15. Written plan of care provided   In addition, today we discussed the following: Hypertension, good medication compliance, ambulatory BP in the 120s Hyperlipidemia, on Lipitor, doing well. Was seen recently with fatigue, see feels better DJD, complains of knee and hip pain, worse on the right, on and off, worse with standing and walking, after he "warm up" and continue walking she feels is slightly better. Occasional back pain.  ROS Denies chest pain or difficulty breathing No nausea, vomiting, diarrhea No cough or sputum production No dysuria, gross hematuria, vaginal discharge or bleeding  Past Medical History  Diagnosis Date  . Breast CA     surgery, chemo, XRT; had peripheral neuropathy (imbalance at times) after chemo  . Anemia   . Gout   . Hyperlipidemia   . Hypertension   . Renal insufficiency   . Osteoarthritis   . GERD  (gastroesophageal reflux disease)     gastritis, EGD 02/2007  . Allergic rhinitis   . Depression   . Macular degeneration 2011  . Cellulitis     Left arm, recurrent     Past Surgical History  Procedure Laterality Date  . Mastectomy  1999    and lymphnodes   . Lung biopsy  1999    neg  . Cataracts bilaterally  1 -2012    History   Social History  . Marital Status: Married    Spouse Name: N/A    Number of Children: 2  . Years of Education: N/A   Occupational History  . retired     Social History Main Topics  . Smoking status: Never Smoker   . Smokeless tobacco: Never Used  . Alcohol Use: 0.0 oz/week    0 Glasses of wine per week     Comment: rarely  . Drug Use: No  . Sexual Activity: Not on file   Other Topics Concern  . Not on file   Social History Narrative   Lives w/ husband , 2 children in [place], 2 [place]             Family History  Problem Relation Age of Onset  . Breast cancer Other     ? of   . Colon cancer Neg Hx   . Heart attack Father 60  . Diabetes Sister     2  .  Hypertension Sister        Medication List       This list is accurate as of: 10/09/14  9:16 AM.  Always use your most recent med list.               allopurinol 100 MG tablet  Commonly known as:  ZYLOPRIM  TAKE 1 TABLET DAILY     amLODipine 5 MG tablet  Commonly known as:  NORVASC  Take 1 tablet daily.     aspirin 81 MG tablet  Take 81 mg by mouth daily.     atorvastatin 20 MG tablet  Commonly known as:  LIPITOR  Take 1 tablet daily.     CENTRUM SILVER PO  Take 1 tablet by mouth daily.     esomeprazole 40 MG capsule  Commonly known as:  NEXIUM  Take 40 mg by mouth daily before breakfast.     FLUoxetine 20 MG tablet  Commonly known as:  PROZAC  Take 1 tablet (20 mg total) by mouth daily.     gabapentin 100 MG capsule  Commonly known as:  NEURONTIN  Take 1-2 capsules (100-200 mg total) by mouth at bedtime.     metoprolol 50 MG tablet  Commonly known as:   LOPRESSOR  TAKE 1 TABLET DAILY (DUE FOR OFFICE VISIT WITH DR. Graycen Sadlon, PLEASE SCHEDULE [phone])     valsartan 320 MG tablet  Commonly known as:  DIOVAN  Take 160 mg by mouth daily.     vitamin B-12 1000 MCG tablet  Commonly known as:  CYANOCOBALAMIN  Take 1,000 mcg by mouth daily.     Vitamin D3 1000 UNITS Caps  Take 1 capsule by mouth daily.           Objective:   Physical Exam BP 134/71 mmHg  Pulse 86  Temp(Src) 98.2 F (36.8 C) (Oral)  Ht 5' 2"  (1.575 m)  Wt 141 lb 2 oz (64.014 kg)  BMI 25.81 kg/m2  SpO2 96% General -- alert, well-developed, NAD.  Neck --no thyromegaly  HEENT-- Not pale.   Breast-- DECLINED Lungs -- normal respiratory effort, no intercostal retractions, no accessory muscle use, and normal breath sounds.  Heart-- normal rate, regular rhythm, no murmur.  Abdomen-- Not distended, good bowel sounds,soft, non-tender. No bruit  Extremities-- no pretibial edema bilaterally ; hips with normal range of motion without pain. Femoral and pedal pulses normal bilaterally Neurologic--  alert & oriented X3. Speech normal, gait appropriate for age, strength symmetric and appropriate for age.  Psych-- Cognition and judgment appear intact. Cooperative with normal attention span and concentration. No anxious or depressed appearing.     Assessment & Plan:

## 2014-10-09 NOTE — Progress Notes (Signed)
Pre visit review using our clinic review tool, if applicable. No additional management support is needed unless otherwise documented below in the visit note. 

## 2014-10-09 NOTE — Assessment & Plan Note (Addendum)
Td today Last Pneumovax:  2005, 2011 prevnar-- today shingles shot Rx was provided before Had a Flu shot  PAP: saw Dr Ubaldo Glassing the last time 2011, was told no further PAPs History of breast cancer, she does occasional SBE and is normal. Declined breast exam today. Reports a recent mammogram was normal.    DEXAs used to be rx by Dr Ubaldo Glassing,  told pt all DEXAs were neg, will order a DEXA   Had a  Colonoscopy 2001 and 2012 (tics), next  Per GI Diet exercise discussed

## 2014-10-09 NOTE — Assessment & Plan Note (Signed)
Hip and knee pain likely due to DJD, vascular exam normal. Recommend use Tylenol as needed, avoid NSAIDs

## 2014-10-09 NOTE — Patient Instructions (Signed)
Get your blood work before you leave   Please come back to the office in 6 months  for a routine check up    Come back fasting       Fall Prevention and [place] cause injuries and can affect all age groups. It is possible to use preventive measures to significantly decrease the likelihood of falls. There are many simple measures which can make your home safer and prevent falls. OUTDOORS  Repair cracks and edges of walkways and driveways.  Remove high doorway thresholds.  Trim shrubbery on the main path into your home.  Have good outside lighting.  Clear walkways of tools, rocks, debris, and clutter.  Check that handrails are not broken and are securely fastened. Both sides of steps should have handrails.  Have leaves, snow, and ice cleared regularly.  Use sand or salt on walkways during winter months.  In the garage, clean up grease or oil spills. BATHROOM  Install night lights.  Install grab bars by the toilet and in the tub and shower.  Use non-skid mats or decals in the tub or shower.  Place a plastic non-slip stool in the shower to sit on, if needed.  Keep floors dry and clean up all water on the floor immediately.  Remove soap buildup in the tub or shower on a regular basis.  Secure bath mats with non-slip, double-sided rug tape.  Remove throw rugs and tripping hazards from the floors. BEDROOMS  Install night lights.  Make sure a bedside light is easy to reach.  Do not use oversized bedding.  Keep a telephone by your bedside.  Have a firm chair with side arms to use for getting dressed.  Remove throw rugs and tripping hazards from the floor. KITCHEN  Keep handles on pots and pans turned toward the center of the stove. Use back burners when possible.  Clean up spills quickly and allow time for drying.  Avoid walking on wet floors.  Avoid hot utensils and knives.  Position shelves so they are not too high or low.  Place commonly used  objects within easy reach.  If necessary, use a sturdy step stool with a grab bar when reaching.  Keep electrical cables out of the way.  Do not use floor polish or wax that makes floors slippery. If you must use wax, use non-skid floor wax.  Remove throw rugs and tripping hazards from the floor. STAIRWAYS  Never leave objects on stairs.  Place handrails on both sides of stairways and use them. Fix any loose handrails. Make sure handrails on both sides of the stairways are as long as the stairs.  Check carpeting to make sure it is firmly attached along stairs. Make repairs to worn or loose carpet promptly.  Avoid placing throw rugs at the top or bottom of stairways, or properly secure the rug with carpet tape to prevent slippage. Get rid of throw rugs, if possible.  Have an electrician put in a light switch at the top and bottom of the stairs. OTHER FALL PREVENTION TIPS  Wear low-heel or rubber-soled shoes that are supportive and fit well. Wear closed toe shoes.  When using a stepladder, make sure it is fully opened and both spreaders are firmly locked. Do not climb a closed stepladder.  Add color or contrast paint or tape to grab bars and handrails in your home. Place contrasting color strips on first and last steps.  Learn and use mobility aids as needed. Install an Dealer  emergency response system.  Turn on lights to avoid dark areas. Replace light bulbs that burn out immediately. Get light switches that glow.  Arrange furniture to create clear pathways. Keep furniture in the same place.  Firmly attach carpet with non-skid or double-sided tape.  Eliminate uneven floor surfaces.  Select a carpet pattern that does not visually hide the edge of steps.  Be aware of all pets. OTHER HOME SAFETY TIPS  Set the water temperature for 120 F (48.8 C).  Keep emergency numbers on or near the telephone.  Keep smoke detectors on every level of the home and near sleeping  areas. Document Released: 09/17/2002 Document Revised: 03/28/2012 Document Reviewed: 12/17/2011 [facility] Patient Information 2015 [place], [place]. This information is not intended to replace advice given to you by your health care provider. Make sure you discuss any questions you have with your health care provider.      Preventive Care for Adults Ages 14 years and over  Blood pressure check.** / Every 1 to 2 years.  Lipid and cholesterol check.** / Every 5 years beginning at age 76 years.  Lung cancer screening. / Every year if you are aged 76-76 years and have a 30-pack-year history of smoking and currently smoke or have quit within the past 15 years. Yearly screening is stopped once you have quit smoking for at least 15 years or develop a health problem that would prevent you from having lung cancer treatment.  Clinical breast exam.** / Every year after age 76 years.  BRCA-related cancer risk assessment.** / For women who have family members with a BRCA-related cancer (breast, ovarian, tubal, or peritoneal cancers).  Mammogram.** / Every year beginning at age 76 years and continuing for as long as you are in good health. Consult with your health care provider.  Pap test.** / Every 3 years starting at age 76 years through age 76 or 76 years with 3 consecutive normal Pap tests. Testing can be stopped between 65 and 70 years with 3 consecutive normal Pap tests and no abnormal Pap or HPV tests in the past 10 years.  HPV screening.** / Every 3 years from ages 76 years through ages 76 or 76 years with a history of 3 consecutive normal Pap tests. Testing can be stopped between 65 and 70 years with 3 consecutive normal Pap tests and no abnormal Pap or HPV tests in the past 10 years.  Fecal occult blood test (FOBT) of stool. / Every year beginning at age 76 years and continuing until age 76 years. You may not need to do this test if you get a colonoscopy every 10 years.  Flexible  sigmoidoscopy or colonoscopy.** / Every 5 years for a flexible sigmoidoscopy or every 10 years for a colonoscopy beginning at age 76 years and continuing until age 76 years.  Hepatitis C blood test.** / For all people born from [birth year] through 1963 and any individual with known risks for hepatitis C.  Osteoporosis screening.** / A one-time screening for women ages 66 years and over and women at risk for fractures or osteoporosis.  Skin self-exam. / Monthly.  Influenza vaccine. / Every year.  Tetanus, diphtheria, and acellular pertussis (Tdap/Td) vaccine.** / 1 dose of Td every 10 years.  Varicella vaccine.** / Consult your health care provider.  Zoster vaccine.** / 1 dose for adults aged 25 years or older.  Pneumococcal 13-valent conjugate (PCV13) vaccine.** / Consult your health care provider.  Pneumococcal polysaccharide (PPSV23) vaccine.** / 1 dose for all adults  aged 71 years and older.  Meningococcal vaccine.** / Consult your health care provider.  Hepatitis A vaccine.** / Consult your health care provider.  Hepatitis B vaccine.** / Consult your health care provider.  Haemophilus influenzae type b (Hib) vaccine.** / Consult your health care provider. ** Family history and personal history of risk and conditions may change your health care provider's recommendations. Document Released: 11/23/2001 Document Revised: 02/11/2014 Document Reviewed: 02/22/2011 [facility] Patient Information 2015 [place], [place]. This information is not intended to replace advice given to you by your health care provider. Make sure you discuss any questions you have with your health care provider.

## 2014-10-09 NOTE — Assessment & Plan Note (Signed)
On diet control, check A1c

## 2014-10-09 NOTE — Assessment & Plan Note (Addendum)
At the last office visit she was felt to be depressed, was prescribed fluoxetine, took it temporarily, did not feel very well on meds and self discontinued it. At this point she denies depression. Plan: Observation

## 2014-10-18 ENCOUNTER — Other Ambulatory Visit: Payer: Self-pay

## 2014-10-18 MED ORDER — ESOMEPRAZOLE MAGNESIUM 40 MG PO CPDR: 40.0000 mg | DELAYED_RELEASE_CAPSULE | Freq: Every day | ORAL | Status: DC

## 2014-11-12 DIAGNOSIS — H35372 Puckering of macula, left eye: Secondary | ICD-10-CM | POA: Diagnosis not present

## 2014-11-12 LAB — HM DIABETES EYE EXAM

## 2014-12-05 ENCOUNTER — Encounter: Payer: Self-pay | Admitting: Internal Medicine

## 2014-12-10 ENCOUNTER — Other Ambulatory Visit (HOSPITAL_COMMUNITY): Payer: Self-pay | Admitting: Cardiology

## 2014-12-10 DIAGNOSIS — E785 Hyperlipidemia, unspecified: Secondary | ICD-10-CM | POA: Diagnosis not present

## 2014-12-10 DIAGNOSIS — D649 Anemia, unspecified: Secondary | ICD-10-CM | POA: Diagnosis not present

## 2014-12-10 DIAGNOSIS — R079 Chest pain, unspecified: Secondary | ICD-10-CM

## 2014-12-10 DIAGNOSIS — I2510 Atherosclerotic heart disease of native coronary artery without angina pectoris: Secondary | ICD-10-CM | POA: Diagnosis not present

## 2014-12-10 DIAGNOSIS — IMO0001 Reserved for inherently not codable concepts without codable children: Secondary | ICD-10-CM

## 2014-12-10 DIAGNOSIS — N189 Chronic kidney disease, unspecified: Secondary | ICD-10-CM | POA: Diagnosis not present

## 2014-12-10 DIAGNOSIS — I10 Essential (primary) hypertension: Secondary | ICD-10-CM | POA: Diagnosis not present

## 2014-12-10 DIAGNOSIS — E119 Type 2 diabetes mellitus without complications: Secondary | ICD-10-CM | POA: Diagnosis not present

## 2014-12-10 HISTORY — DX: Reserved for inherently not codable concepts without codable children: IMO0001

## 2014-12-14 ENCOUNTER — Encounter (HOSPITAL_COMMUNITY): Payer: Self-pay | Admitting: Emergency Medicine

## 2014-12-14 ENCOUNTER — Emergency Department (HOSPITAL_COMMUNITY)
Admission: EM | Admit: 2014-12-14 | Discharge: 2014-12-15 | Disposition: A | Discharge status: Home / Self Care | Payer: Medicare Other | Attending: Emergency Medicine | Admitting: Emergency Medicine

## 2014-12-14 ENCOUNTER — Emergency Department (HOSPITAL_COMMUNITY): Payer: Medicare Other

## 2014-12-14 DIAGNOSIS — E785 Hyperlipidemia, unspecified: Secondary | ICD-10-CM | POA: Insufficient documentation

## 2014-12-14 DIAGNOSIS — Z862 Personal history of diseases of the blood and blood-forming organs and certain disorders involving the immune mechanism: Secondary | ICD-10-CM | POA: Insufficient documentation

## 2014-12-14 DIAGNOSIS — R079 Chest pain, unspecified: Secondary | ICD-10-CM | POA: Insufficient documentation

## 2014-12-14 DIAGNOSIS — Z8659 Personal history of other mental and behavioral disorders: Secondary | ICD-10-CM | POA: Insufficient documentation

## 2014-12-14 DIAGNOSIS — M1990 Unspecified osteoarthritis, unspecified site: Secondary | ICD-10-CM | POA: Insufficient documentation

## 2014-12-14 DIAGNOSIS — Z79899 Other long term (current) drug therapy: Secondary | ICD-10-CM | POA: Insufficient documentation

## 2014-12-14 DIAGNOSIS — Z7982 Long term (current) use of aspirin: Secondary | ICD-10-CM | POA: Insufficient documentation

## 2014-12-14 DIAGNOSIS — Z8669 Personal history of other diseases of the nervous system and sense organs: Secondary | ICD-10-CM | POA: Insufficient documentation

## 2014-12-14 DIAGNOSIS — I10 Essential (primary) hypertension: Secondary | ICD-10-CM | POA: Diagnosis not present

## 2014-12-14 DIAGNOSIS — Z853 Personal history of malignant neoplasm of breast: Secondary | ICD-10-CM | POA: Diagnosis not present

## 2014-12-14 DIAGNOSIS — K219 Gastro-esophageal reflux disease without esophagitis: Secondary | ICD-10-CM | POA: Diagnosis not present

## 2014-12-14 DIAGNOSIS — M109 Gout, unspecified: Secondary | ICD-10-CM | POA: Diagnosis not present

## 2014-12-14 DIAGNOSIS — Z87448 Personal history of other diseases of urinary system: Secondary | ICD-10-CM | POA: Insufficient documentation

## 2014-12-14 DIAGNOSIS — Z872 Personal history of diseases of the skin and subcutaneous tissue: Secondary | ICD-10-CM | POA: Insufficient documentation

## 2014-12-14 DIAGNOSIS — R000 Tachycardia, unspecified: Secondary | ICD-10-CM | POA: Diagnosis not present

## 2014-12-14 LAB — COMPREHENSIVE METABOLIC PANEL
ALBUMIN: 3.7 g/dL (ref 3.5–5.2)
ALK PHOS: 73 U/L (ref 39–117)
ALT: 15 U/L (ref 0–35)
ANION GAP: 6 (ref 5–15)
AST: 23 U/L (ref 0–37)
BILIRUBIN TOTAL: 0.3 mg/dL (ref 0.3–1.2)
BUN: 45 mg/dL — ABNORMAL HIGH (ref 6–23)
CHLORIDE: 108 mmol/L (ref 96–112)
CO2: 24 mmol/L (ref 19–32)
Calcium: 9.8 mg/dL (ref 8.4–10.5)
Creatinine, Ser: 2.04 mg/dL — ABNORMAL HIGH (ref 0.50–1.10)
GFR calc Af Amer: 26 mL/min — ABNORMAL LOW (ref >90)
GFR calc non Af Amer: 23 mL/min — ABNORMAL LOW (ref >90)
Glucose, Bld: 155 mg/dL — ABNORMAL HIGH (ref 70–99)
Potassium: 4.3 mmol/L (ref 3.5–5.1)
Sodium: 138 mmol/L (ref 135–145)
TOTAL PROTEIN: 7.8 g/dL (ref 6.0–8.3)

## 2014-12-14 LAB — CBC
HCT: 35.9 % — ABNORMAL LOW (ref 36.0–46.0)
HEMOGLOBIN: 12 g/dL (ref 12.0–15.0)
MCH: 31.7 pg (ref 26.0–34.0)
MCHC: 33.4 g/dL (ref 30.0–36.0)
MCV: 95 fL (ref 78.0–100.0)
Platelets: 207 10*3/uL (ref 150–400)
RBC: 3.78 MIL/uL — AB (ref 3.87–5.11)
RDW: 14.6 % (ref 11.5–15.5)
WBC: 10.8 10*3/uL — AB (ref 4.0–10.5)

## 2014-12-14 LAB — I-STAT TROPONIN, ED: TROPONIN I, POC: 0.01 ng/mL (ref 0.00–0.08)

## 2014-12-14 MED ORDER — SODIUM CHLORIDE 0.9 % IV BOLUS (SEPSIS)
500.0000 mL | Freq: Once | INTRAVENOUS | Status: AC
  Administered 2014-12-14: 500 mL via INTRAVENOUS

## 2014-12-14 MED ORDER — LORAZEPAM 2 MG/ML IJ SOLN
0.5000 mg | Freq: Once | INTRAMUSCULAR | Status: AC
  Administered 2014-12-15: 0.5 mg via INTRAVENOUS
  Filled 2014-12-14: qty 1

## 2014-12-14 MED ORDER — GI COCKTAIL ~~LOC~~
30.0000 mL | Freq: Once | ORAL | Status: AC
  Administered 2014-12-14: 30 mL via ORAL
  Filled 2014-12-14: qty 30

## 2014-12-14 MED ORDER — ACETAMINOPHEN 325 MG PO TABS
650.0000 mg | ORAL_TABLET | Freq: Once | ORAL | Status: AC
  Administered 2014-12-15: 650 mg via ORAL
  Filled 2014-12-14: qty 2

## 2014-12-14 MED ORDER — FAMOTIDINE IN NACL 20-0.9 MG/50ML-% IV SOLN
20.0000 mg | Freq: Once | INTRAVENOUS | Status: AC
  Administered 2014-12-14: 20 mg via INTRAVENOUS
  Filled 2014-12-14: qty 50

## 2014-12-14 NOTE — ED Notes (Signed)
MD at bedside. 

## 2014-12-14 NOTE — ED Provider Notes (Addendum)
CSN: [id number]     Arrival date & time 12/14/14  2117 History   First MD Initiated Contact with Patient 12/14/14 2119     Chief Complaint  Patient presents with  . Chest Pain     (Consider location/radiation/quality/duration/timing/severity/associated sxs/prior Treatment) HPI Comments: Patient presents to the ER for evaluation of chest pain. Patient reports that she had onset of a burning sensation from her throat down to her chest while eating at all about 2 hours ago. Patient reports that she does have a history of GERD. She stopped taking her Prevacid one week ago because she read that it could damage her kidneys. Patient went home and took 2 Tums with significant improvement. She continues to have some slight burning sensation. Patient reports that she recently saw Dr. Terrence Dupont and was told that she might have had a heart attack in the past based on the EKG. Patient denies any shortness of breath.  Patient is a 79 y.o. female presenting with chest pain.  Chest Pain Associated symptoms: no shortness of breath     Past Medical History  Diagnosis Date  . Breast CA     surgery, chemo, XRT; had peripheral neuropathy (imbalance at times) after chemo  . Anemia   . Gout   . Hyperlipidemia   . Hypertension   . Renal insufficiency   . Osteoarthritis   . GERD (gastroesophageal reflux disease)     gastritis, EGD 02/2007  . Allergic rhinitis   . Depression   . Macular degeneration 2011  . Cellulitis     Left arm, recurrent    Past Surgical History  Procedure Laterality Date  . Mastectomy Left 1999    and lymphnodes   . Lung biopsy  1999    neg  . Cataracts bilaterally  1 -2012   Family History  Problem Relation Age of Onset  . Breast cancer Other     ? of   . Colon cancer Neg Hx   . Heart attack Father 39  . Diabetes Sister     2  . Hypertension Sister    History  Substance Use Topics  . Smoking status: Never Smoker   . Smokeless tobacco: Never Used  . Alcohol Use: 0.0  oz/week    0 Glasses of wine per week     Comment: rarely   OB History    No data available     Review of Systems  Respiratory: Negative for shortness of breath.   Cardiovascular: Positive for chest pain.  All other systems reviewed and are negative.     Allergies  Codeine; Morphine; Oxycodone-acetaminophen; Sulfonamide derivatives; and Tramadol hcl  Home Medications   Prior to Admission medications   Medication Sig Start Date End Date Taking? Authorizing Provider  allopurinol (ZYLOPRIM) 100 MG tablet TAKE 1 TABLET DAILY 08/27/14  Yes Colon Branch, MD  amLODipine (NORVASC) 5 MG tablet Take 1 tablet daily. Patient taking differently: Take 10 mg by mouth daily. Take 2 tablets daily. 07/29/14  Yes Colon Branch, MD  aspirin 81 MG tablet Take 81 mg by mouth daily.     Yes Historical Provider, MD  atorvastatin (LIPITOR) 20 MG tablet Take 1 tablet daily. 08/07/14  Yes Colon Branch, MD  Cholecalciferol (VITAMIN D3) 1000 UNITS CAPS Take 1 capsule by mouth daily.    Yes Historical Provider, MD  gabapentin (NEURONTIN) 100 MG capsule Take 1-2 capsules (100-200 mg total) by mouth at bedtime. 04/29/14  Yes Colon Branch, MD  metoprolol tartrate (LOPRESSOR) 25 MG tablet Take 1 tablet (25 mg total) by mouth daily. Patient taking differently: Take 50 mg by mouth daily.  10/09/14  Yes Colon Branch, MD  Multiple Vitamins-Minerals (CENTRUM SILVER PO) Take 1 tablet by mouth daily.    Yes Historical Provider, MD  valsartan (DIOVAN) 320 MG tablet Take 160 mg by mouth daily.    Yes Historical Provider, MD  vitamin B-12 (CYANOCOBALAMIN) 1000 MCG tablet Take 1,000 mcg by mouth daily.     Yes Historical Provider, MD  esomeprazole (NEXIUM) 40 MG capsule Take 1 capsule (40 mg total) by mouth daily before breakfast. 10/18/14   Colon Branch, MD   BP 168/77 mmHg  Pulse 110  Temp(Src) 99.2 F (37.3 C) (Oral)  Resp 12  Ht 5' 2"  (1.575 m)  Wt 138 lb (62.596 kg)  BMI 25.23 kg/m2  SpO2 98% Physical Exam  Constitutional:  She is oriented to person, place, and time. She appears well-developed and well-nourished. No distress.  HENT:  Head: Normocephalic and atraumatic.  Right Ear: Hearing normal.  Left Ear: Hearing normal.  Nose: Nose normal.  Mouth/Throat: Oropharynx is clear and moist and mucous membranes are normal.  Eyes: Conjunctivae and EOM are normal. Pupils are equal, round, and reactive to light.  Neck: Normal range of motion. Neck supple.  Cardiovascular: Regular rhythm, S1 normal and S2 normal.  Exam reveals no gallop and no friction rub.   No murmur heard. Pulmonary/Chest: Effort normal and breath sounds normal. No respiratory distress. She exhibits no tenderness.  Abdominal: Soft. Normal appearance and bowel sounds are normal. There is no hepatosplenomegaly. There is no tenderness. There is no rebound, no guarding, no tenderness at McBurney's point and negative Murphy's sign. No hernia.  Musculoskeletal: Normal range of motion.  Neurological: She is alert and oriented to person, place, and time. She has normal strength. No cranial nerve deficit or sensory deficit. Coordination normal. GCS eye subscore is 4. GCS verbal subscore is 5. GCS motor subscore is 6.  Skin: Skin is warm, dry and intact. No rash noted. No cyanosis.  Psychiatric: She has a normal mood and affect. Her speech is normal and behavior is normal. Thought content normal.  Nursing note and vitals reviewed.   ED Course  Procedures (including critical care time) Labs Review Labs Reviewed  COMPREHENSIVE METABOLIC PANEL - Abnormal; Notable for the following:    Glucose, Bld 155 (*)    BUN 45 (*)    Creatinine, Ser 2.04 (*)    GFR calc non Af Amer 23 (*)    GFR calc Af Amer 26 (*)    All other components within normal limits  CBC - Abnormal; Notable for the following:    WBC 10.8 (*)    RBC 3.78 (*)    HCT 35.9 (*)    All other components within normal limits  I-STAT TROPOININ, ED    Imaging Review Dg Chest 2  View  12/14/2014   CLINICAL DATA:  Initial evaluation chills heartburn tachycardia high blood pressure  EXAM: CHEST  2 VIEW  COMPARISON:  06/02/2011  FINDINGS: Heart size and vascular pattern are normal. Old left rib fractures. No consolidation effusion or pneumothorax. Postsurgical change left mid lung stable.  IMPRESSION: No active cardiopulmonary disease.   Electronically Signed   By: Skipper Cliche M.D.   On: 12/14/2014 22:45     EKG Interpretation   Date/Time:  Saturday December 14 2014 21:22:06 EST Ventricular Rate:  118 PR Interval:  177 QRS Duration: 90 QT Interval:  313 QTC Calculation: 438 R Axis:   40 Text Interpretation:  Age not entered, assumed to be  79 years old for  purpose of ECG interpretation Sinus tachycardia Confirmed by Lylliana Kitamura  MD,  [place] [phone]) on 12/14/2014 9:32:50 PM      MDM   Final diagnoses:  None   chest pain  GERD  Headache  Patient presents to the ER for evaluation of chest pain. Patient had onset of a burning sensation in her throat, esophagus and chest while eating at all of guarding. Patient does report a previous history of reflux, stopped taking her Prevacid because of concern over possibly causing kidney damage. She did have significant improvement with Tums. She had some mild burning sensation upon arrival which completely resolved after GI cocktail and IV Pepcid here in the ER. EKG does not show any evidence of ischemia. Troponin is negative. Patient not experiencing any shortness of breath with the symptoms.  Patient was noted to have some mild tachycardia upon arrival to the ER. She seemed very anxious upon arrival. She was administered a small dose of Ativan and IV fluids. Tachycardia remains unchanged. Will add a d-dimer. She cannot have CT angiography because of chronic renal insufficiency.   She is complaining of a headache as well. She was given Tylenol for her headache.  Will be monitored here in the ER to have a second troponin.  Will sign out to oncoming ER physician. Chest pain symptoms are very atypical. Would feel comfortable discharging the patient for follow-up with primary care doctor if second troponin is negative. If the d-dimer is elevated, however, patient might require further evaluation for possible PE. Her PT test probability for PE is very low, but the tachycardia puts PE in the differential diagnosis. As she cannot have CT angiography and nuclear medicine is not available, could potentially require hospitalization for further workup if d-dimer is significantly elevated.     Orpah Greek, MD 12/14/14 3825  Orpah Greek, MD 12/15/14 [phone]

## 2014-12-14 NOTE — ED Notes (Signed)
Per EMS, pt was eating at [employer] two hours ago when she had an episode of indigestion. Pt took 2 tums with relief. Pt stopped taking Prilosec 1 week ago. Family advised pt to be seen due to EKG abnormality at PCP. Per EMS, PCP told PCP previous EKG showed a potential old MI, but no tests were done. Pt has lab work at stress test scheduled for next week. NAD noted. VSS.

## 2014-12-15 DIAGNOSIS — R079 Chest pain, unspecified: Secondary | ICD-10-CM | POA: Diagnosis not present

## 2014-12-15 LAB — TROPONIN I: Troponin I: <0.03 ng/mL (ref <0.031)

## 2014-12-15 LAB — D-DIMER, QUANTITATIVE: D-Dimer, Quant: 1.05 ug/mL-FEU — ABNORMAL HIGH (ref 0.00–0.48)

## 2014-12-15 MED ORDER — ESOMEPRAZOLE MAGNESIUM 40 MG PO CPDR: 40.0000 mg | DELAYED_RELEASE_CAPSULE | Freq: Every day | ORAL | Status: DC

## 2014-12-15 NOTE — ED Provider Notes (Addendum)
1:44 AM The patient is overall well-appearing.  Her heart rate is 97.  Clinically this sounds exactly like gastroesophageal reflux disease.  Doubt ACS doubt PE.  No chest pain or shortness of breath at this time.  Pulse ox is 98%.  EKG is without ischemic changes.  Troponin is negative 2.  Patient with renal insufficiency.  Her d-dimer is 1.05 however my pretest probability is so low I have no sense what to make with this result.  I don't believe this is clinically significant.  I recommended that the patient restart her Nexium.  Discharge home in good condition.  Primary care follow-up.  Hoy Morn, MD 12/15/14 0145   EKG Interpretation  Date/Time:  Saturday December 14 2014 21:22:06 EST Ventricular Rate:  118 PR Interval:  177 QRS Duration: 90 QT Interval:  313 QTC Calculation: 438 R Axis:   40 Text Interpretation:  Age not entered, assumed to be  79 years old for purpose of ECG interpretation Sinus tachycardia Confirmed by POLLINA  MD, CHRISTOPHER [phone]) on 12/14/2014 9:32:50 PM       Dg Chest 2 View  12/14/2014   CLINICAL DATA:  Initial evaluation chills heartburn tachycardia high blood pressure  EXAM: CHEST  2 VIEW  COMPARISON:  06/02/2011  FINDINGS: Heart size and vascular pattern are normal. Old left rib fractures. No consolidation effusion or pneumothorax. Postsurgical change left mid lung stable.  IMPRESSION: No active cardiopulmonary disease.   Electronically Signed   By: Skipper Cliche M.D.   On: 12/14/2014 22:45     Hoy Morn, MD 12/15/14 [phone]

## 2014-12-15 NOTE — ED Notes (Signed)
Pt. Refused wheelchair and left with all belongings

## 2014-12-15 NOTE — Discharge Instructions (Signed)
Gastroesophageal Reflux Disease, Adult Gastroesophageal reflux disease (GERD) happens when acid from your stomach flows up into the esophagus. When acid comes in contact with the esophagus, the acid causes soreness (inflammation) in the esophagus. Over time, GERD may create small holes (ulcers) in the lining of the esophagus. CAUSES   Increased body weight. This puts pressure on the stomach, making acid rise from the stomach into the esophagus.  Smoking. This increases acid production in the stomach.  Drinking alcohol. This causes decreased pressure in the lower esophageal sphincter (valve or ring of muscle between the esophagus and stomach), allowing acid from the stomach into the esophagus.  Late evening meals and a full stomach. This increases pressure and acid production in the stomach.  A malformed lower esophageal sphincter. Sometimes, no cause is found. SYMPTOMS   Burning pain in the lower part of the mid-chest behind the breastbone and in the mid-stomach area. This may occur twice a week or more often.  Trouble swallowing.  Sore throat.  Dry cough.  Asthma-like symptoms including chest tightness, shortness of breath, or wheezing. DIAGNOSIS  Your caregiver may be able to diagnose GERD based on your symptoms. In some cases, X-rays and other tests may be done to check for complications or to check the condition of your stomach and esophagus. TREATMENT  Your caregiver may recommend over-the-counter or prescription medicines to help decrease acid production. Ask your caregiver before starting or adding any new medicines.  HOME CARE INSTRUCTIONS   Change the factors that you can control. Ask your caregiver for guidance concerning weight loss, quitting smoking, and alcohol consumption.  Avoid foods and drinks that make your symptoms worse, such as:  Caffeine or alcoholic drinks.  Chocolate.  Peppermint or mint flavorings.  Garlic and onions.  Spicy foods.  Citrus fruits,  such as oranges, lemons, or limes.  Tomato-based foods such as sauce, chili, salsa, and pizza.  Fried and fatty foods.  Avoid lying down for the 3 hours prior to your bedtime or prior to taking a nap.  Eat small, frequent meals instead of large meals.  Wear loose-fitting clothing. Do not wear anything tight around your waist that causes pressure on your stomach.  Raise the head of your bed 6 to 8 inches with wood blocks to help you sleep. Extra pillows will not help.  Only take over-the-counter or prescription medicines for pain, discomfort, or fever as directed by your caregiver.  Do not take aspirin, ibuprofen, or other nonsteroidal anti-inflammatory drugs (NSAIDs). SEEK IMMEDIATE MEDICAL CARE IF:   You have pain in your arms, neck, jaw, teeth, or back.  Your pain increases or changes in intensity or duration.  You develop nausea, vomiting, or sweating (diaphoresis).  You develop shortness of breath, or you faint.  Your vomit is green, yellow, black, or looks like coffee grounds or blood.  Your stool is red, bloody, or black. These symptoms could be signs of other problems, such as heart disease, gastric bleeding, or esophageal bleeding. MAKE SURE YOU:   Understand these instructions.  Will watch your condition.  Will get help right away if you are not doing well or get worse. Document Released: 07/07/2005 Document Revised: 12/20/2011 Document Reviewed: 04/16/2011 Exit[employer] Patient Information 2015 [employer]. This information is not intended to replace advice given to you by your health care provider. Make sure you discuss any questions you have with your health care provider.  

## 2014-12-15 NOTE — ED Notes (Signed)
MD Campos at bedside.

## 2014-12-16 ENCOUNTER — Ambulatory Visit (INDEPENDENT_AMBULATORY_CARE_PROVIDER_SITE_OTHER): Payer: Medicare Other | Admitting: Internal Medicine

## 2014-12-16 ENCOUNTER — Encounter: Payer: Self-pay | Admitting: Internal Medicine

## 2014-12-16 VITALS — BP 116/74 | HR 65 | Temp 98.1°F | Ht 62.0 in | Wt 140.4 lb

## 2014-12-16 DIAGNOSIS — I2510 Atherosclerotic heart disease of native coronary artery without angina pectoris: Secondary | ICD-10-CM | POA: Diagnosis not present

## 2014-12-16 DIAGNOSIS — I10 Essential (primary) hypertension: Secondary | ICD-10-CM

## 2014-12-16 DIAGNOSIS — E119 Type 2 diabetes mellitus without complications: Secondary | ICD-10-CM | POA: Diagnosis not present

## 2014-12-16 DIAGNOSIS — R079 Chest pain, unspecified: Secondary | ICD-10-CM | POA: Diagnosis not present

## 2014-12-16 DIAGNOSIS — K219 Gastro-esophageal reflux disease without esophagitis: Secondary | ICD-10-CM | POA: Diagnosis not present

## 2014-12-16 DIAGNOSIS — D649 Anemia, unspecified: Secondary | ICD-10-CM | POA: Diagnosis not present

## 2014-12-16 DIAGNOSIS — E785 Hyperlipidemia, unspecified: Secondary | ICD-10-CM | POA: Diagnosis not present

## 2014-12-16 DIAGNOSIS — N189 Chronic kidney disease, unspecified: Secondary | ICD-10-CM | POA: Diagnosis not present

## 2014-12-16 MED ORDER — RANITIDINE HCL 150 MG PO TABS: 150.0000 mg | ORAL_TABLET | Freq: Every day | ORAL | Status: DC

## 2014-12-16 NOTE — Patient Instructions (Signed)
For heartburn, take ranitidine 1 tablet at bedtime Okay to take Tums as needed as well.  Please review the medication list below be sure is what you are actually taking  Next visit by June 2016 for a checkup.  If you have more palpitations and heartburn, call anytime

## 2014-12-16 NOTE — Progress Notes (Signed)
Subjective:    Patient ID: Samantha Mckenzie, female    DOB: [birth year], 79 y.o.   MRN: [id number]  DOS:  12/16/2014 Type of visit - description : ER f/u Interval history: On 12/14/2014 he was eating a restaurant, shortly after she developed a hot sensation at the throat and chest, she felt it was GERD, took Tums and felt better. Then she feels slightly shaky and had palpitation, went to the ER. In the ER, EKG showed no acute changes, troponin negative 2. D-dimer was mildly elevated but felt to be not clinically significant. She had mild tachycardia but eventually improved. Was recommended to go back on Nexium. She didn't. No further symptoms.   Review of Systems Denies chest pain, difficulty breathing. Occasionally lower extremity edema at the end of the day, no calf pain. No dysphasia or odynophagia.  Past Medical History  Diagnosis Date  . Breast CA     surgery, chemo, XRT; had peripheral neuropathy (imbalance at times) after chemo  . Anemia   . Gout   . Hyperlipidemia   . Hypertension   . Renal insufficiency   . Osteoarthritis   . GERD (gastroesophageal reflux disease)     gastritis, EGD 02/2007  . Allergic rhinitis   . Depression   . Macular degeneration 2011  . Cellulitis     Left arm, recurrent     Past Surgical History  Procedure Laterality Date  . Mastectomy Left 1999    and lymphnodes   . Lung biopsy  1999    neg  . Cataracts bilaterally  1 -2012    History   Social History  . Marital Status: Married    Spouse Name: N/A  . Number of Children: 2  . Years of Education: N/A   Occupational History  . retired     Social History Main Topics  . Smoking status: Never Smoker   . Smokeless tobacco: Never Used  . Alcohol Use: 0.0 oz/week    0 Glasses of wine per week     Comment: rarely  . Drug Use: No  . Sexual Activity: Not on file   Other Topics Concern  . Not on file   Social History Narrative   Lives w/ husband , 2 children in [place], 2 [place]               Medication List       This list is accurate as of: 12/16/14 11:59 PM.  Always use your most recent med list.               allopurinol 100 MG tablet  Commonly known as:  ZYLOPRIM  TAKE 1 TABLET DAILY     amLODipine 10 MG tablet  Commonly known as:  NORVASC  Take 10 mg by mouth daily.     aspirin 81 MG tablet  Take 81 mg by mouth daily.     atorvastatin 20 MG tablet  Commonly known as:  LIPITOR  Take 1 tablet daily.     CENTRUM SILVER PO  Take 1 tablet by mouth daily.     esomeprazole 40 MG capsule  Commonly known as:  NEXIUM  Take 1 capsule (40 mg total) by mouth daily before breakfast.     gabapentin 100 MG capsule  Commonly known as:  NEURONTIN  Take 1-2 capsules (100-200 mg total) by mouth at bedtime.     metoprolol tartrate 25 MG tablet  Commonly known as:  LOPRESSOR  Take 1 tablet (25 mg total)  by mouth daily.     ranitidine 150 MG tablet  Commonly known as:  ZANTAC  Take 1 tablet (150 mg total) by mouth at bedtime.     valsartan 320 MG tablet  Commonly known as:  DIOVAN  Take 160 mg by mouth daily.     vitamin B-12 1000 MCG tablet  Commonly known as:  CYANOCOBALAMIN  Take 1,000 mcg by mouth daily.     Vitamin D3 1000 UNITS Caps  Take 1 capsule by mouth daily.           Objective:   Physical Exam BP 116/74 mmHg  Pulse 65  Temp(Src) 98.1 F (36.7 C) (Oral)  Ht 5' 2"  (1.575 m)  Wt 140 lb 6 oz (63.674 kg)  BMI 25.67 kg/m2  SpO2 94% General:   Well developed, well nourished . NAD.  HEENT:  Normocephalic . Face symmetric, atraumatic Lungs:  CTA B Normal respiratory effort, no intercostal retractions, no accessory muscle use. Heart: RRR,  no murmur.  Abdomen:  Not distended, soft, non-tender. No rebound or rigidity. No mass,organomegaly Muscle skeletal: no pretibial edema bilaterally  Skin: Not pale. Not jaundice Neurologic:  alert & oriented X3.  Speech normal, gait appropriate for age and unassisted Psych--  Cognition  and judgment appear intact.  Cooperative with normal attention span and concentration.  Behavior appropriate. No anxious or depressed appearing.       Assessment & Plan:

## 2014-12-16 NOTE — Assessment & Plan Note (Signed)
Last week (prior to go to the ER) she went to see a cardiologist, Dr. Terrence Dupont at the request of her husband, he increased the  amlodipine dose. I'm slightly confused about how she is taking Toprol and Diovan, I recommend the patient to call us when she go home and tell us exactly what medications she is taking. At this point her blood pressure is very good.

## 2014-12-16 NOTE — Assessment & Plan Note (Addendum)
Patient went to the ER with symptoms consistent with GERD, she used to take Nexium but discontinue the medication because things that she read PPIs. We talk about alternatives, she does not like to take PPIs. Plan: ranitidine, okay to take Tums as needed. Avoid other OTC medications that could contain Mg (she has renal failure).

## 2014-12-16 NOTE — Progress Notes (Signed)
Pre visit review using our clinic review tool, if applicable. No additional management support is needed unless otherwise documented below in the visit note. 

## 2014-12-18 ENCOUNTER — Other Ambulatory Visit: Payer: Self-pay | Admitting: Internal Medicine

## 2014-12-20 ENCOUNTER — Encounter (HOSPITAL_COMMUNITY)
Admission: RE | Admit: 2014-12-20 | Discharge: 2014-12-20 | Disposition: A | Discharge status: Home / Self Care | Payer: Medicare Other | Source: Ambulatory Visit | Attending: Cardiology | Admitting: Cardiology

## 2014-12-20 ENCOUNTER — Other Ambulatory Visit: Payer: Self-pay

## 2014-12-20 VITALS — BP 127/50 | HR 75

## 2014-12-20 DIAGNOSIS — E119 Type 2 diabetes mellitus without complications: Secondary | ICD-10-CM | POA: Diagnosis not present

## 2014-12-20 DIAGNOSIS — R079 Chest pain, unspecified: Secondary | ICD-10-CM | POA: Insufficient documentation

## 2014-12-20 DIAGNOSIS — I10 Essential (primary) hypertension: Secondary | ICD-10-CM | POA: Diagnosis not present

## 2014-12-20 DIAGNOSIS — I2510 Atherosclerotic heart disease of native coronary artery without angina pectoris: Secondary | ICD-10-CM | POA: Diagnosis not present

## 2014-12-20 DIAGNOSIS — R0789 Other chest pain: Secondary | ICD-10-CM | POA: Diagnosis not present

## 2014-12-20 LAB — HEPATIC FUNCTION PANEL
ALK PHOS: 74 U/L (ref 39–117)
ALT: 17 U/L (ref 0–35)
AST: 19 U/L (ref 0–37)
Albumin: 3.5 g/dL (ref 3.5–5.2)
BILIRUBIN TOTAL: 0.3 mg/dL (ref 0.3–1.2)
TOTAL PROTEIN: 7.8 g/dL (ref 6.0–8.3)

## 2014-12-20 LAB — BASIC METABOLIC PANEL
Anion gap: 9 (ref 5–15)
BUN: 43 mg/dL — AB (ref 6–23)
CHLORIDE: 105 mmol/L (ref 96–112)
CO2: 26 mmol/L (ref 19–32)
CREATININE: 1.86 mg/dL — AB (ref 0.50–1.10)
Calcium: 9.1 mg/dL (ref 8.4–10.5)
GFR calc non Af Amer: 25 mL/min — ABNORMAL LOW (ref >90)
GFR, EST AFRICAN AMERICAN: 29 mL/min — AB (ref >90)
Glucose, Bld: 127 mg/dL — ABNORMAL HIGH (ref 70–99)
Potassium: 5 mmol/L (ref 3.5–5.1)
Sodium: 140 mmol/L (ref 135–145)

## 2014-12-20 MED ORDER — TECHNETIUM TC 99M SESTAMIBI GENERIC - CARDIOLITE
10.0000 | Freq: Once | INTRAVENOUS | Status: AC | PRN
  Administered 2014-12-20: 10 via INTRAVENOUS

## 2014-12-20 MED ORDER — REGADENOSON 0.4 MG/5ML IV SOLN
0.4000 mg | Freq: Once | INTRAVENOUS | Status: AC
  Administered 2014-12-20: 0.4 mg via INTRAVENOUS

## 2014-12-20 MED ORDER — TECHNETIUM TC 99M SESTAMIBI GENERIC - CARDIOLITE
30.0000 | Freq: Once | INTRAVENOUS | Status: AC | PRN
  Administered 2014-12-20: 30 via INTRAVENOUS

## 2014-12-20 MED ORDER — REGADENOSON 0.4 MG/5ML IV SOLN
INTRAVENOUS | Status: AC
  Filled 2014-12-20: qty 5

## 2014-12-21 LAB — HEMOGLOBIN A1C
Hgb A1c MFr Bld: 6.4 % — ABNORMAL HIGH (ref 4.8–5.6)
MEAN PLASMA GLUCOSE: 137 mg/dL

## 2014-12-25 ENCOUNTER — Telehealth: Payer: Self-pay | Admitting: Internal Medicine

## 2014-12-25 NOTE — Telephone Encounter (Signed)
Pts husband states that pt has been having lots of heartburn and epigastric pain. Husband requests pt be seen. Pt scheduled to see Nicoletta Ba PA tomorrow at 3pm. Husband aware of appt.

## 2014-12-25 NOTE — Telephone Encounter (Signed)
Left message for pt to call back  °

## 2014-12-26 ENCOUNTER — Ambulatory Visit (INDEPENDENT_AMBULATORY_CARE_PROVIDER_SITE_OTHER): Payer: Medicare Other | Admitting: Physician Assistant

## 2014-12-26 ENCOUNTER — Encounter: Payer: Self-pay | Admitting: Physician Assistant

## 2014-12-26 VITALS — BP 128/50 | HR 88 | Ht 62.0 in | Wt 139.1 lb

## 2014-12-26 DIAGNOSIS — K2100 Gastro-esophageal reflux disease with esophagitis, without bleeding: Secondary | ICD-10-CM

## 2014-12-26 MED ORDER — FAMOTIDINE 40 MG PO TABS: ORAL_TABLET | ORAL | Status: DC

## 2014-12-26 NOTE — Progress Notes (Signed)
Patient ID: Samantha Mckenzie, female   DOB: May 08, 1936, 79 y.o.   MRN: [id number]   Subjective:    Patient ID: Samantha Mckenzie, female    DOB: 10-Aug-1936, 79 y.o.   MRN: [id number]  HPI Samantha Mckenzie is a pleasant 79 year old Hispanic female known to Dr. Henrene Mckenzie remotely. She has history of breast cancer, adult-onset diabetes mellitus, hypertension, chronic kidney disease, and depression. She had undergone an EGD in 2008 and was found to have moderate esophagitis and an acute gastritis. There is no evidence of Barrett's. She has been on PPI therapy since then. She recently stopped Nexium about 3 weeks ago because of reading about potential issues with kidney problems. She has had a significant increase in reflux symptoms and heartburn since. She had an ER visit a couple of weeks ago for chest pain and epigastric pain. She had cardiac workup done which was negative and has since seen Dr. Terrence Mckenzie, had her blood pressure medication adjusted and also had a nuclear stress test done on 12/15/2014 which was read as negative. She is seen Dr. Lincoln Mckenzie to go back on Nexium. She did not want to do this and was given a prescription for Zantac 150 daily at bedtime. She has tried the Zantac but does not feel this is helping. He has taken some Tums and Mylanta as well. She has no complaints of dysphagia and no current odynophagia. She has been having heartburn indigestion and some discomfort with swallowing.   Review of Systems Pertinent positive and negative review of systems were noted in the above HPI section.  All other review of systems was otherwise negative.  Outpatient Encounter Prescriptions as of 12/26/2014  Medication Sig  . allopurinol (ZYLOPRIM) 100 MG tablet TAKE 1 TABLET DAILY  . amLODipine (NORVASC) 10 MG tablet Take 10 mg by mouth daily.  Marland Kitchen aspirin 81 MG tablet Take 81 mg by mouth daily.    Marland Kitchen atorvastatin (LIPITOR) 20 MG tablet Take 1 tablet daily.  . Cholecalciferol (VITAMIN D3) 1000 UNITS CAPS Take 1  capsule by mouth daily.   . famotidine (PEPCID) 40 MG tablet Take 1 tab before breakfast and dinner. ( twice daily)  . gabapentin (NEURONTIN) 100 MG capsule TAKE 1 TO 2 CAPSULES (100 TO 200 MG TOTAL) AT BEDTIME  . metoprolol tartrate (LOPRESSOR) 25 MG tablet Take 1 tablet (25 mg total) by mouth daily. (Patient taking differently: Take 12.5 mg by mouth daily. )  . Multiple Vitamins-Minerals (CENTRUM SILVER PO) Take 1 tablet by mouth daily.   . ranitidine (ZANTAC) 150 MG tablet Take 1 tablet (150 mg total) by mouth at bedtime.  . valsartan (DIOVAN) 320 MG tablet Take 160 mg by mouth daily.   . vitamin B-12 (CYANOCOBALAMIN) 1000 MCG tablet Take 1,000 mcg by mouth daily.    . [DISCONTINUED] amLODipine (NORVASC) 5 MG tablet TAKE 1 TABLET DAILY  . [DISCONTINUED] esomeprazole (NEXIUM) 40 MG capsule Take 1 capsule (40 mg total) by mouth daily before breakfast.   Allergies  Allergen Reactions  . Codeine Nausea And Vomiting  . Morphine Nausea And Vomiting  . Oxycodone-Acetaminophen Nausea And Vomiting  . Sulfonamide Derivatives Nausea And Vomiting  . Tramadol Hcl Nausea And Vomiting   Patient Active Problem List   Diagnosis Date Noted  . Myalgia and myositis 03/08/2014  . Dizziness and giddiness 05/06/2013  . Leg pain, RLS? neuropathy? 06/21/2012  . Cellulitis LEFT arm recurrent 12/08/2011  . Medicare annual wellness visit, subsequent 06/22/2011  . Depression   . NECK PAIN 12/28/2010  .  Fatigue 12/28/2010  . VITAMIN B12 DEFICIENCY 04/01/2008  . GERD 12/19/2007  . NEOP, MALIGNANT, FEMALE BREAST NOS 07/07/2007  . RENAL INSUFFICIENCY 07/07/2007  . Osteoarthritis 07/07/2007  . DM II (diabetes mellitus, type II), controlled 02/28/2007  . Hyperlipidemia 02/28/2007  . GOUT 02/28/2007  . ANEMIA-NOS 02/28/2007  . Essential hypertension 02/28/2007  . COLONOSCOPY, HX OF 02/28/2007   History   Social History  . Marital Status: Married    Spouse Name: N/A  . Number of Children: 2  . Years of  Education: N/A   Occupational History  . retired     Social History Main Topics  . Smoking status: Never Smoker   . Smokeless tobacco: Never Used  . Alcohol Use: 0.0 oz/week    0 Glasses of wine per week     Comment: rarely  . Drug Use: No  . Sexual Activity: Not on file   Other Topics Concern  . Not on file   Social History Narrative   Lives w/ husband , 2 children in [place], [address] [place]            Samantha Mckenzie's family history includes Breast cancer in her other; Diabetes in her sister; Heart attack (age of onset: 78) in her father; Hypertension in her sister. There is no history of Samantha cancer.      Objective:    Filed Vitals:   12/26/14 1443  BP: 128/50  Pulse: 88    Physical Exam  well-developed older Hispanic female in no acute distress, accompanied by her husband, pleasant blood pressure 128/50 pulse 88 height 5 foot 2 weight 139 and HEENT; nontraumatic normocephalic EOMI PERRLA sclera anicteric, Supple; no JVD, Cardiovascular; regular rate and rhythm with S1-S2 no murmur or gallop, Pulmonary; clear bilaterally, Abdomen ;soft nontender nondistended bowel sounds are active there is no palpable mass or hepatosplenomegaly, Rectal ;exam not done, Extremities ;no clubbing cyanosis or edema skin warm and dry, Psych; mood and affect appropriate       Assessment & Plan:  #1 79 yo female with chronic GERD and hx of reflux induced esophagitis with worsening GERD sxs off PPI therapy- pt does not want to continue PPI  rx due to concerns with contributing to  renal failure May have esophagitis #2 AODM #3 CKD #4 HTN #5 hx breast CA  Plan; Strict  ant-reflux regimen- reviewed with pt and education material, need to elevate HOB 45 degrees ,try wedge pillow Start Pepcid 40 mg BID She will call back in a few weeks with update     Samantha Ferguson PA-C 12/26/2014   Cc: Samantha Branch, MD

## 2014-12-26 NOTE — Patient Instructions (Signed)
We sent a prescription to [facility], [place], [place]. 1. Pepcid 40 mg. Take 1 tab before breakfast and dinner. ( Twice daily).   Follow up with Dr. Scarlette Shorts as needed.  We have given you information on Reflux.

## 2014-12-27 NOTE — Progress Notes (Signed)
Agree 

## 2014-12-29 ENCOUNTER — Other Ambulatory Visit: Payer: Self-pay | Admitting: Internal Medicine

## 2014-12-29 ENCOUNTER — Emergency Department (HOSPITAL_COMMUNITY)
Admission: EM | Admit: 2014-12-29 | Discharge: 2014-12-29 | Disposition: A | Discharge status: Home / Self Care | Payer: Medicare Other | Attending: Emergency Medicine | Admitting: Emergency Medicine

## 2014-12-29 ENCOUNTER — Emergency Department (HOSPITAL_COMMUNITY): Payer: Medicare Other

## 2014-12-29 ENCOUNTER — Encounter (HOSPITAL_COMMUNITY): Payer: Self-pay | Admitting: Emergency Medicine

## 2014-12-29 DIAGNOSIS — Z9221 Personal history of antineoplastic chemotherapy: Secondary | ICD-10-CM | POA: Diagnosis not present

## 2014-12-29 DIAGNOSIS — K219 Gastro-esophageal reflux disease without esophagitis: Secondary | ICD-10-CM | POA: Insufficient documentation

## 2014-12-29 DIAGNOSIS — Z79899 Other long term (current) drug therapy: Secondary | ICD-10-CM | POA: Insufficient documentation

## 2014-12-29 DIAGNOSIS — Z853 Personal history of malignant neoplasm of breast: Secondary | ICD-10-CM | POA: Diagnosis not present

## 2014-12-29 DIAGNOSIS — R109 Unspecified abdominal pain: Secondary | ICD-10-CM | POA: Insufficient documentation

## 2014-12-29 DIAGNOSIS — M1990 Unspecified osteoarthritis, unspecified site: Secondary | ICD-10-CM | POA: Insufficient documentation

## 2014-12-29 DIAGNOSIS — I10 Essential (primary) hypertension: Secondary | ICD-10-CM | POA: Diagnosis not present

## 2014-12-29 DIAGNOSIS — N281 Cyst of kidney, acquired: Secondary | ICD-10-CM | POA: Diagnosis not present

## 2014-12-29 DIAGNOSIS — Z872 Personal history of diseases of the skin and subcutaneous tissue: Secondary | ICD-10-CM | POA: Insufficient documentation

## 2014-12-29 DIAGNOSIS — R197 Diarrhea, unspecified: Secondary | ICD-10-CM | POA: Diagnosis not present

## 2014-12-29 DIAGNOSIS — Z862 Personal history of diseases of the blood and blood-forming organs and certain disorders involving the immune mechanism: Secondary | ICD-10-CM | POA: Diagnosis not present

## 2014-12-29 DIAGNOSIS — Z7982 Long term (current) use of aspirin: Secondary | ICD-10-CM | POA: Insufficient documentation

## 2014-12-29 DIAGNOSIS — R1032 Left lower quadrant pain: Secondary | ICD-10-CM | POA: Diagnosis not present

## 2014-12-29 HISTORY — DX: Reserved for inherently not codable concepts without codable children: IMO0001

## 2014-12-29 HISTORY — DX: Other fatigue: R53.83

## 2014-12-29 LAB — URINALYSIS, ROUTINE W REFLEX MICROSCOPIC
BILIRUBIN URINE: NEGATIVE
Glucose, UA: NEGATIVE mg/dL
HGB URINE DIPSTICK: NEGATIVE
KETONES UR: NEGATIVE mg/dL
NITRITE: NEGATIVE
PH: 5.5 (ref 5.0–8.0)
Protein, ur: NEGATIVE mg/dL
Specific Gravity, Urine: 1.006 (ref 1.005–1.030)
UROBILINOGEN UA: 0.2 mg/dL (ref 0.0–1.0)

## 2014-12-29 LAB — URINE MICROSCOPIC-ADD ON

## 2014-12-29 LAB — CBC WITH DIFFERENTIAL/PLATELET
BASOS PCT: 0 % (ref 0–1)
Basophils Absolute: 0 10*3/uL (ref 0.0–0.1)
EOS ABS: 0 10*3/uL (ref 0.0–0.7)
Eosinophils Relative: 1 % (ref 0–5)
HEMATOCRIT: 33.7 % — AB (ref 36.0–46.0)
Hemoglobin: 10.9 g/dL — ABNORMAL LOW (ref 12.0–15.0)
LYMPHS ABS: 1.2 10*3/uL (ref 0.7–4.0)
LYMPHS PCT: 19 % (ref 12–46)
MCH: 31.2 pg (ref 26.0–34.0)
MCHC: 32.3 g/dL (ref 30.0–36.0)
MCV: 96.6 fL (ref 78.0–100.0)
MONO ABS: 0.4 10*3/uL (ref 0.1–1.0)
MONOS PCT: 6 % (ref 3–12)
NEUTROS ABS: 4.5 10*3/uL (ref 1.7–7.7)
Neutrophils Relative %: 74 % (ref 43–77)
PLATELETS: 234 10*3/uL (ref 150–400)
RBC: 3.49 MIL/uL — ABNORMAL LOW (ref 3.87–5.11)
RDW: 14.8 % (ref 11.5–15.5)
WBC: 6.1 10*3/uL (ref 4.0–10.5)

## 2014-12-29 LAB — COMPREHENSIVE METABOLIC PANEL
ALK PHOS: 80 U/L (ref 39–117)
ALT: 31 U/L (ref 0–35)
AST: 37 U/L (ref 0–37)
Albumin: 3.4 g/dL — ABNORMAL LOW (ref 3.5–5.2)
Anion gap: 10 (ref 5–15)
BILIRUBIN TOTAL: 0.4 mg/dL (ref 0.3–1.2)
BUN: 42 mg/dL — ABNORMAL HIGH (ref 6–23)
CALCIUM: 8.6 mg/dL (ref 8.4–10.5)
CO2: 24 mmol/L (ref 19–32)
Chloride: 103 mmol/L (ref 96–112)
Creatinine, Ser: 2.59 mg/dL — ABNORMAL HIGH (ref 0.50–1.10)
GFR calc Af Amer: 20 mL/min — ABNORMAL LOW (ref >90)
GFR calc non Af Amer: 17 mL/min — ABNORMAL LOW (ref >90)
GLUCOSE: 155 mg/dL — AB (ref 70–99)
POTASSIUM: 4.3 mmol/L (ref 3.5–5.1)
Sodium: 137 mmol/L (ref 135–145)
Total Protein: 7.3 g/dL (ref 6.0–8.3)

## 2014-12-29 LAB — LIPASE, BLOOD: LIPASE: 53 U/L (ref 11–59)

## 2014-12-29 LAB — LACTIC ACID, PLASMA: Lactic Acid, Venous: 2.3 mmol/L (ref 0.5–2.0)

## 2014-12-29 MED ORDER — SODIUM CHLORIDE 0.9 % IV BOLUS (SEPSIS)
250.0000 mL | Freq: Once | INTRAVENOUS | Status: AC
  Administered 2014-12-29: 250 mL via INTRAVENOUS

## 2014-12-29 MED ORDER — IOHEXOL 300 MG/ML  SOLN: 25.0000 mL | Freq: Once | INTRAMUSCULAR | Status: DC | PRN

## 2014-12-29 MED ORDER — SODIUM CHLORIDE 0.9 % IV SOLN
INTRAVENOUS | Status: DC
  Administered 2014-12-29: 12:00:00 via INTRAVENOUS

## 2014-12-29 NOTE — ED Provider Notes (Signed)
CSN: [id number]     Arrival date & time 12/29/14  0940 History   First MD Initiated Contact with Patient 12/29/14 1004     Chief Complaint  Patient presents with  . Diarrhea  . Abdominal Pain      HPI Pt was seen at 1015. Per pt, c/o gradual onset and persistence of multiple intermittent episodes of diarrhea that began last night.   Describes the stools as "watery."  Has been associated with "a little" LLQ abd "pain." Denies N/V, no CP/SOB, no back pain, no fevers, no black or blood in stools.     Past Medical History  Diagnosis Date  . Breast CA     surgery, chemo, XRT; had peripheral neuropathy (imbalance at times) after chemo  . Anemia   . Gout   . Hyperlipidemia   . Hypertension   . Renal insufficiency   . Osteoarthritis   . GERD (gastroesophageal reflux disease)     gastritis, EGD 02/2007  . Allergic rhinitis   . Depression   . Macular degeneration 2011  . Cellulitis     Left arm, recurrent   . Normal cardiac stress test 12/2014  . Fatigue    Past Surgical History  Procedure Laterality Date  . Mastectomy Left 1999    and lymphnodes   . Lung biopsy  1999    neg  . Cataracts bilaterally  1 -2012   Family History  Problem Relation Age of Onset  . Breast cancer Other     ? of   . Colon cancer Neg Hx   . Heart attack Father 47  . Diabetes Sister     2  . Hypertension Sister    History  Substance Use Topics  . Smoking status: Never Smoker   . Smokeless tobacco: Never Used  . Alcohol Use: 0.0 oz/week    0 Glasses of wine per week     Comment: rarely    Review of Systems ROS: Statement: All systems negative except as marked or noted in the HPI; Constitutional: Negative for fever and chills. ; ; Eyes: Negative for eye pain, redness and discharge. ; ; ENMT: Negative for ear pain, hoarseness, nasal congestion, sinus pressure and sore throat. ; ; Cardiovascular: Negative for chest pain, palpitations, diaphoresis, dyspnea and peripheral edema. ; ; Respiratory:  Negative for cough, wheezing and stridor. ; ; Gastrointestinal: +diarrhea, abd pain. Negative for nausea, vomiting, blood in stool, hematemesis, jaundice and rectal bleeding. . ; ; Genitourinary: Negative for dysuria, flank pain and hematuria. ; ; Musculoskeletal: Negative for back pain and neck pain. Negative for swelling and trauma.; ; Skin: Negative for pruritus, rash, abrasions, blisters, bruising and skin lesion.; ; Neuro: Negative for headache, lightheadedness and neck stiffness. Negative for weakness, altered level of consciousness , altered mental status, extremity weakness, paresthesias, involuntary movement, seizure and syncope.      Allergies  Codeine; Morphine; Oxycodone-acetaminophen; Sulfonamide derivatives; and Tramadol hcl  Home Medications   Prior to Admission medications   Medication Sig Start Date End Date Taking? Authorizing Provider  allopurinol (ZYLOPRIM) 100 MG tablet TAKE 1 TABLET DAILY 08/27/14   Colon Branch, MD  amLODipine (NORVASC) 10 MG tablet Take 10 mg by mouth daily.    Historical Provider, MD  aspirin 81 MG tablet Take 81 mg by mouth daily.      Historical Provider, MD  atorvastatin (LIPITOR) 20 MG tablet Take 1 tablet daily. 08/07/14   Colon Branch, MD  Cholecalciferol (VITAMIN D3) 1000 UNITS  CAPS Take 1 capsule by mouth daily.     Historical Provider, MD  famotidine (PEPCID) 40 MG tablet Take 1 tab before breakfast and dinner. ( twice daily) 12/26/14   Amy S Esterwood, PA-C  gabapentin (NEURONTIN) 100 MG capsule TAKE 1 TO 2 CAPSULES (100 TO 200 MG TOTAL) AT BEDTIME 12/18/14   Colon Branch, MD  metoprolol tartrate (LOPRESSOR) 25 MG tablet Take 1 tablet (25 mg total) by mouth daily. Patient taking differently: Take 12.5 mg by mouth daily.  10/09/14   Colon Branch, MD  Multiple Vitamins-Minerals (CENTRUM SILVER PO) Take 1 tablet by mouth daily.     Historical Provider, MD  ranitidine (ZANTAC) 150 MG tablet Take 1 tablet (150 mg total) by mouth at bedtime. 12/16/14   Colon Branch, MD  valsartan (DIOVAN) 320 MG tablet Take 160 mg by mouth daily.     Historical Provider, MD  vitamin B-12 (CYANOCOBALAMIN) 1000 MCG tablet Take 1,000 mcg by mouth daily.      Historical Provider, MD   BP 123/46 mmHg  Pulse 70  Temp(Src) 98.4 F (36.9 C) (Oral)  Resp 10  Ht 5' 2"  (1.575 m)  Wt 135 lb (61.236 kg)  BMI 24.69 kg/m2  SpO2 95%   Filed Vitals:   12/29/14 1300 12/29/14 1330 12/29/14 1430 12/29/14 1500  BP: 119/49 117/51 120/48 123/46  Pulse: 75 73 71 70  Temp:      TempSrc:      Resp: 15 11 12 10   Height:      Weight:      SpO2: 97% 93% 96% 95%    11:28:52 Orthostatic Vital Signs KC  Orthostatic Lying  - BP- Lying: 115/52 mmHg ; Pulse- Lying: 78  Orthostatic Sitting - BP- Sitting: 111/50 mmHg ; Pulse- Sitting: 88  Orthostatic Standing at 0 minutes - BP- Standing at 0 minutes: 118/51 mmHg ; Pulse- Standing at 0 minutes: 90      Physical Exam  1020: Physical examination:  Nursing notes reviewed; Vital signs and O2 SAT reviewed;  Constitutional: Well developed, Well nourished, Well hydrated, In no acute distress; Head:  Normocephalic, atraumatic; Eyes: EOMI, PERRL, No scleral icterus; ENMT: Mouth and pharynx normal, Mucous membranes moist; Neck: Supple, Full range of motion, No lymphadenopathy; Cardiovascular: Regular rate and rhythm, No gallop; Respiratory: Breath sounds clear & equal bilaterally, No wheezes.  Speaking full sentences with ease, Normal respiratory effort/excursion; Chest: Nontender, Movement normal; Abdomen: Soft, +mild LLQ tenderness to palp. No rebound or guarding. Nondistended, Normal bowel sounds; Genitourinary: No CVA tenderness; Extremities: Pulses normal, No tenderness, No edema, No calf edema or asymmetry.; Neuro: AA&Ox3, Major CN grossly intact.  Speech clear. No gross focal motor or sensory deficits in extremities.; Skin: Color normal, Warm, Dry.; Psych:  Affect flat, poor eye contact.    ED Course  Procedures   1025:  During my exam,  pt stated she "was wearing a pad because the stool just keeps coming out of me." States she currently was "sitting in stool." ED Tech went into room to clean pt; pad dry, no stool noted. Will continue to monitor.   1550:  Pt is not orthostatic on VS. BUN/Cr elevated per baseline. Workup otherwise reassuring. Lactic acid very mildly elevated; IVF bolus given as well as PO fluids. CO2 on CMP is normal. Doubt sepsis at this time. Pt has tol PO well while in the ED without N/V.  No stooling while in the ED.  Abd benign, VSS/afebrile. Feels better and  wants to go home now. Dx and testing d/w pt and family.  Questions answered.  Verb understanding, agreeable to d/c home with outpt f/u.     MDM  MDM Reviewed: previous chart, nursing note and vitals Reviewed previous: labs Interpretation: labs and CT scan      Results for orders placed or performed during the hospital encounter of 12/29/14  CBC with Differential  Result Value Ref Range   WBC 6.1 4.0 - 10.5 K/uL   RBC 3.49 (L) 3.87 - 5.11 MIL/uL   Hemoglobin 10.9 (L) 12.0 - 15.0 g/dL   HCT 33.7 (L) 36.0 - 46.0 %   MCV 96.6 78.0 - 100.0 fL   MCH 31.2 26.0 - 34.0 pg   MCHC 32.3 30.0 - 36.0 g/dL   RDW 14.8 11.5 - 15.5 %   Platelets 234 150 - 400 K/uL   Neutrophils Relative % 74 43 - 77 %   Neutro Abs 4.5 1.7 - 7.7 K/uL   Lymphocytes Relative 19 12 - 46 %   Lymphs Abs 1.2 0.7 - 4.0 K/uL   Monocytes Relative 6 3 - 12 %   Monocytes Absolute 0.4 0.1 - 1.0 K/uL   Eosinophils Relative 1 0 - 5 %   Eosinophils Absolute 0.0 0.0 - 0.7 K/uL   Basophils Relative 0 0 - 1 %   Basophils Absolute 0.0 0.0 - 0.1 K/uL  Comprehensive metabolic panel  Result Value Ref Range   Sodium 137 135 - 145 mmol/L   Potassium 4.3 3.5 - 5.1 mmol/L   Chloride 103 96 - 112 mmol/L   CO2 24 19 - 32 mmol/L   Glucose, Bld 155 (H) 70 - 99 mg/dL   BUN 42 (H) 6 - 23 mg/dL   Creatinine, Ser 2.59 (H) 0.50 - 1.10 mg/dL   Calcium 8.6 8.4 - 10.5 mg/dL   Total Protein 7.3 6.0 -  8.3 g/dL   Albumin 3.4 (L) 3.5 - 5.2 g/dL   AST 37 0 - 37 U/L   ALT 31 0 - 35 U/L   Alkaline Phosphatase 80 39 - 117 U/L   Total Bilirubin 0.4 0.3 - 1.2 mg/dL   GFR calc non Af Amer 17 (L) >90 mL/min   GFR calc Af Amer 20 (L) >90 mL/min   Anion gap 10 5 - 15  Lipase, blood  Result Value Ref Range   Lipase 53 11 - 59 U/L  Urinalysis, Routine w reflex microscopic  Result Value Ref Range   Color, Urine YELLOW YELLOW   APPearance CLEAR CLEAR   Specific Gravity, Urine 1.006 1.005 - 1.030   pH 5.5 5.0 - 8.0   Glucose, UA NEGATIVE NEGATIVE mg/dL   Hgb urine dipstick NEGATIVE NEGATIVE   Bilirubin Urine NEGATIVE NEGATIVE   Ketones, ur NEGATIVE NEGATIVE mg/dL   Protein, ur NEGATIVE NEGATIVE mg/dL   Urobilinogen, UA 0.2 0.0 - 1.0 mg/dL   Nitrite NEGATIVE NEGATIVE   Leukocytes, UA TRACE (A) NEGATIVE  Lactic acid, plasma  Result Value Ref Range   Lactic Acid, Venous 2.3 (HH) 0.5 - 2.0 mmol/L  Urine microscopic-add on  Result Value Ref Range   Squamous Epithelial / LPF RARE RARE   WBC, UA 3-6 <3 WBC/hpf   Casts HYALINE CASTS (A) NEGATIVE    Ct Abdomen Pelvis Wo Contrast 12/29/2014   CLINICAL DATA:  Diffuse abdominal pain, diarrhea. History of breast cancer status post chemotherapy and radiation.  EXAM: CT ABDOMEN AND PELVIS WITHOUT CONTRAST  TECHNIQUE: Multidetector CT imaging of  the abdomen and pelvis was performed following the standard protocol without IV contrast.  COMPARISON:  None.  FINDINGS: Lower chest: Status post left mastectomy with radiation changes in the left upper lobe.  Hepatobiliary: Unenhanced liver is within normal limits.  Gallbladder is unremarkable. No intrahepatic or extrahepatic ductal dilatation.  Pancreas: Within normal limits.  Spleen: Calcified splenic granulomata.  Adrenals/Urinary Tract: Adrenal glands are unremarkable.  Bilateral renal cysts, including a 19 mm cyst along the lateral interpolar right kidney (series 2/ image 39), a 6 mm cyst along the lateral  interpolar left kidney (series 2/ image 43), and a 5 mm hyperdense/hemorrhagic cyst along the posterior right lower kidney (series 2/ image 46).  No renal, ureteral, or bladder calculi.  Bladder is within normal limits.  Stomach/Bowel: Stomach is notable for a tiny hiatal hernia.  No evidence of bowel obstruction.  Normal appendix.  Colonic diverticulosis, without evidence of diverticulitis.  Vascular/Lymphatic: Atherosclerotic calcifications of the abdominal aorta and branch vessels.  No suspicious abdominopelvic lymphadenopathy.  Reproductive: Uterus is unremarkable.  Bilateral ovaries are within normal limits.  Other: No abdominopelvic ascites.  Musculoskeletal: Grade 1 anterolisthesis of L4 on L5. Visualized osseous structures are otherwise within normal limits.  IMPRESSION: No renal, ureteral, or bladder calculi.  No hydronephrosis.  No evidence of bowel obstruction.  Normal appendix.  No CT findings to account for the patient's abdominal pain.   Electronically Signed   By: Julian Hy M.D.   On: 12/29/2014 12:54    Nm Myocar Multi W/spect W/wall Motion / Ef 12/20/2014   CLINICAL DATA:  Chest pain.  Cardiac chest pain.  EXAM: MYOCARDIAL IMAGING WITH SPECT (REST AND EXERCISE)  GATED LEFT VENTRICULAR WALL MOTION STUDY  LEFT VENTRICULAR EJECTION FRACTION  TECHNIQUE: Standard myocardial SPECT imaging was performed after resting intravenous injection of 10 mCi [id number]. Subsequently, exercise tolerance test was performed by the patient under the supervision of the Cardiology staff. At peak-stress, 30 mCi [id number] was injected intravenously and standard myocardial SPECT imaging was performed. Quantitative gated imaging was also performed to evaluate left ventricular wall motion, and estimate left ventricular ejection fraction.  COMPARISON:  Chest radiograph 12/14/2014.  FINDINGS: Perfusion: No decreased activity in the left ventricle on stress imaging to suggest reversible ischemia or  infarction.  Wall Motion: Normal left ventricular wall motion. No left ventricular dilation.  Left Ventricular Ejection Fraction: 68 %  End diastolic volume 63 ml  End systolic volume 20 ml  IMPRESSION: 1. No reversible ischemia or infarction.  2. Normal left ventricular wall motion.  3. Left ventricular ejection fraction 68%  4. Low-risk stress test findings*.  *2012 Appropriate Use Criteria for Coronary Revascularization Focused Update: J Am Coll Cardiol. 203343;56(8):616-837http://[email or web address]=[email or web address]   Electronically Signed   By: GeDereck Ligas.D.   On: 12/20/2014 13:42      KaFrancine GravenDO 01/01/15 1409

## 2014-12-29 NOTE — Discharge Instructions (Signed)
Emergency Department Resource Guide 1) Find a Doctor and Pay Out of Pocket Although you won't have to find out who is covered by your insurance plan, it is a good idea to ask around and get recommendations. You will then need to call the office and see if the doctor you have chosen will accept you as a new patient and what types of options they offer for patients who are self-pay. Some doctors offer discounts or will set up payment plans for their patients who do not have insurance, but you will need to ask so you aren't surprised when you get to your appointment.  2) Contact Your Local Health Department Not all health departments have doctors that can see patients for sick visits, but many do, so it is worth a call to see if yours does. If you don't know where your local health department is, you can check in your phone book. The CDC also has a tool to help you locate your state's health department, and many state websites also have listings of all of their local health departments.  3) Find a Hope Clinic If your illness is not likely to be very severe or complicated, you may want to try a walk in clinic. These are popping up all over the country in pharmacies, drugstores, and shopping centers. They're usually staffed by nurse practitioners or physician assistants that have been trained to treat common illnesses and complaints. They're usually fairly quick and inexpensive. However, if you have serious medical issues or chronic medical problems, these are probably not your best option.  No Primary Care Doctor: - Call Health Connect at  [phone] - they can help you locate a primary care doctor that  accepts your insurance, provides certain services, etc. - Physician Referral Service- [phone]  Chronic Pain Problems: Organization         Address  Phone   Notes  [facility]  [phone] Patients need to be referred by their primary care doctor.   Medication  Assistance: Organization         Address  Phone   Notes  [facility] Medication [facility] [address] [place], [place] [address] [phone] --Must be a resident of [facility] -- Must have NO insurance coverage whatsoever (no Medicaid/ Medicare, etc.) -- The pt. MUST have a primary care doctor that directs their care regularly and follows them in the community   [facility]  [phone]   [employer]  [phone]    Agencies that provide inexpensive medical care: Organization         Address  Phone   Notes  [place]  [phone]   [facility]    [phone]   [facility] [place], [place] [address] [phone]   [place] [address], [place] [phone]   Planned Parenthood    [phone]   [facility]    [phone]   [place] [address], [place] Phone:  [phone], Fax:  [phone] Hours of Operation:  9 am - 6 pm, M-F.  Also accepts Medicaid/Medicare and self-pay.  [facility] for [place], [address], [place] Phone: [phone], Fax: [phone]. Hours of Operation:  8:30 am - 5:30 pm, M-F.  Also accepts Medicaid and self-pay.  [facility] [address], [place] Phone: [phone]   [place], [place], [place] [phone], Ext. [phone] Mondays & Thursdays: 7-9 AM.  First 15 patients are seen on a first come, first serve basis.    [place] Providers:  Organization         Address  Phone   Notes  [facility] [address], [place] [phone] Also accepts self-pay patients.  [facility] [address], [place]  [phone]   [place], [address], [place]  [phone]   [facility] [address], [place] [phone]   Lucianne Lei [address], [place]   [phone] Only accepts [place] Access [place] patients after they have their name applied to their card.   Self-Pay (no insurance) in [facility]:  Organization         Address  Phone   Notes  Sickle Cell Patients, [facility] Internal Medicine [place] [phone]   [facility]gent [facility] [place] [phone]   [facility] [place]  [place], [place], [place] [phone]   [facility]/Dr. Osei-Bonsu  [address] [place] or [address], [place] [phone] Phone number for both [place] and [place] locations is the same.  [facility] [address] [place] [phone]   [facility] [address] [place] or [address] [phone]   [facility] [address], [place] [phone], phone; [phone], fax Sees patients 1st and 3rd Saturday of every month.  Must not qualify for public or private insurance (i.e. Medicaid, Medicare, [place] Health Choice, Veterans' Benefits)  Household income should be no more than 200% of the poverty level The clinic cannot treat you if you are pregnant or think you are pregnant  Sexually transmitted diseases are not treated at the clinic.    Dental Care: Organization         Address  Phone  Notes  [facility] Department of [facility] [phone] Accepts children up to age 51 who are enrolled in [place] or [place]; pregnant women with a Medicaid card; and children who have applied for Medicaid or [place] Health Choice, but were declined, whose parents can pay a reduced fee at time of service.  [facility]  Department of [facility]  [address], [place] [phone] Accepts children up to age 44 who are enrolled in [place] or [place]; pregnant women with a Medicaid card; and children who have applied for Medicaid or [place] Health Choice, but were declined, whose parents can pay a reduced fee at time of service.  [place] Adult Dental Access PROGRAM  [place] [phone] Patients are seen by appointment only. Walk-ins are not accepted. [place] will see patients 57 years of age and older. Monday - Tuesday (8am-5pm) Most Wednesdays (8:30-5pm) $30 per visit, cash only  [facility] Adult Dental Access PROGRAM  [address], [facility] [phone] Patients are seen by appointment only. Walk-ins are not accepted. [facility] [place] will see patients 26 years of age and older. One  Wednesday Evening (Monthly: Volunteer Based).  $30 per visit, cash only  [place]  [phone] for adults; Children under age 40, call Graduate Pediatric Dentistry at [phone]. Children aged 46-14, please call [phone] to request a pediatric application.  Dental services are provided in all areas of dental care including fillings, crowns and bridges, complete and partial dentures, implants, gum treatment, root canals, and extractions. Preventive care is also provided. Treatment is provided to both adults and children. Patients are selected via a lottery and there is often a waiting list.   [facility] [address] [place]  [phone] [email or web address]   [facility] [address] [place], [place] [phone], Ext. [phone] Second and Fourth Thursday of each month, opens at 6:30 AM; Clinic ends at 9 AM.  Patients are seen on a first-come first-served basis, and a limited number are seen during each clinic.   [facility]  [address] Hillard Danker [place], [place] [phone]    Eligibility Requirements You must have lived in [place], [place], or [place] counties for at least the last three months.   You cannot be eligible for state or federal sponsored [employer], including [employer], [place], or [employer].   You generally cannot be eligible for healthcare insurance through your employer.    How to apply: Eligibility screenings are held every Tuesday and Wednesday afternoon from 1:00 pm until 4:00 pm. You do not need an appointment for the interview!  [facility] [address] [place], [place]   [place]  [place] Department  [place]  [phone]    Behavioral Health Resources in the Community: Intensive Outpatient Programs Organization         Address  Phone  Notes  [place]. [address], [place], [place] [phone]   [facility] Outpatient [address], [place], [place]   ADS: Alcohol [facility] Drug S[facility]cs [address] [place], [place]   [place] [address],  [place], [place] or [phone]   Substance Abuse Resources Organization         Address  Phone  Notes  Al[facility] Drug Services  [phone]   [place]  [phone]   [facility] [place]   Chinita Pester  [phone]   Residential & Outpatient Substance Abuse Program  [phone]   Psychological Services Organization         Address  Phone  Notes  [facility] [place]  [place]  [phone]   [place] [address] [place] or [phone]    Mobile Crisis Teams Organization         Address  Phone  Notes  [facility], Mobile Crisis Care Unit  [phone]   [facility]  [address]  [place], [place]   [place] [address] [place] [phone]    Self-Help/Support Groups Organization         Address  Phone             Notes  [place]. of [place] - variety of support groups  [place] Call for more information  Narcotics Anonymous (NA), [facility] [address], [employer] [place]  2 meetings at this location  Residential Treatment Programs Organization         Address  Phone  Notes  [facility] [address],    [place]  [phone]   [facility]  [address] [place] [phone], [place], [place]   [place], [place] [phone] Admissions: 8am-3pm M-F  Incentives Substance [place] [address],    [place], [place] [phone]   [facility] [address] [place], [place], [place]   [facility] [address]  [place], [place]   [facility] - Intensive Outpatient [address] Kristeen [address], [place], [place]   [facility] [address].,  [place], [place] [phone] or [phone]   [facility] Services (RTS) [address] [place], [place] Accepts Medicaid  [facility] [place] [address]  [place] 1-4133298493 Substance Abuse/Addiction Treatment   [facility] Organization         Address  Phone  Notes  [facility]  [phone]   Domenic Schwab, PhD [address] [place]lis [place], [place]   [phone] or [phone]   [place]ancy [place], [place] [phone]   [facility] [address] [place], [place] [phone] Insurance/Medicaid/sponsorship through [facility] Families [address], [address] [place]                                    [place], [place] [phone] [place] [address] [place], [place] [phone]    Dr. Adele Schilder  [phone]   [facility] Dept. 1) [address], [place] 2) [place] 3)  [place] [address], [place] [phone]  [phone]   [place] [phone] or [phone] (After Hours)      Take your usual prescriptions as previously directed.  Increase your fluid intake (ie:  Gatoraide) for the next few days, as discussed.  Eat a bland diet and advance to your regular diet slowly as you can tolerate it.   Avoid full strength juices, as well as milk and milk products until your diarrhea has resolved.   Call your regular medical doctor tomorrow to schedule a follow up appointment in the next 2 days.  Return to the Emergency Department immediately if not improving (or even worsening) despite taking the medicines as prescribed, any black or bloody stool or vomit, if you develop a fever over "101," or for any other concerns.

## 2014-12-29 NOTE — ED Notes (Signed)
Pt is in a gown and on the monitor.

## 2014-12-29 NOTE — ED Notes (Signed)
Pt getting dressed in gown. Heather EMT notified

## 2014-12-29 NOTE — ED Notes (Signed)
Pt c/o diarrhea onset last night. Pt denies n/v.

## 2015-01-06 ENCOUNTER — Telehealth: Payer: Self-pay | Admitting: Physician Assistant

## 2015-01-06 NOTE — Telephone Encounter (Signed)
Spoke with the husband who calls for the patient. She is taking her Pepcid BID and continues to have symptoms. She is not wanting to eat because of the pain that it causes. Frequent use of TUMS. He thinks she has lost weight. She is following the reflux precautions, diet and has a wedge to elevate her head when she sleep. The patient wants an EGD to determine what is "going on inside". Please advise.

## 2015-01-07 NOTE — Telephone Encounter (Signed)
Arrange EGD in [place]

## 2015-01-08 ENCOUNTER — Ambulatory Visit (AMBULATORY_SURGERY_CENTER): Payer: Self-pay

## 2015-01-08 VITALS — Ht 62.0 in | Wt 138.2 lb

## 2015-01-08 DIAGNOSIS — K219 Gastro-esophageal reflux disease without esophagitis: Secondary | ICD-10-CM

## 2015-01-08 NOTE — Progress Notes (Signed)
No allergies to eggs or soy No diet/weight loss meds No home oxygen No past problems with anesthesia  No email

## 2015-01-08 NOTE — Telephone Encounter (Signed)
Patient is in agreement with this plan. Pre-visit today. EGD 01/13/15.

## 2015-01-13 ENCOUNTER — Ambulatory Visit (AMBULATORY_SURGERY_CENTER): Payer: Medicare Other | Admitting: Internal Medicine

## 2015-01-13 ENCOUNTER — Encounter: Payer: Self-pay | Admitting: Internal Medicine

## 2015-01-13 VITALS — BP 127/63 | HR 66 | Temp 95.5°F | Resp 19 | Ht 62.0 in | Wt 138.0 lb

## 2015-01-13 DIAGNOSIS — I10 Essential (primary) hypertension: Secondary | ICD-10-CM | POA: Diagnosis not present

## 2015-01-13 DIAGNOSIS — K219 Gastro-esophageal reflux disease without esophagitis: Secondary | ICD-10-CM

## 2015-01-13 DIAGNOSIS — F329 Major depressive disorder, single episode, unspecified: Secondary | ICD-10-CM | POA: Diagnosis not present

## 2015-01-13 DIAGNOSIS — N186 End stage renal disease: Secondary | ICD-10-CM | POA: Diagnosis not present

## 2015-01-13 MED ORDER — SODIUM CHLORIDE 0.9 % IV SOLN: 500.0000 mL | INTRAVENOUS | Status: DC

## 2015-01-13 NOTE — Progress Notes (Signed)
Awake with Spont resp, Alert and oriented pleased with MAC, Report to RN

## 2015-01-13 NOTE — Op Note (Signed)
[place]  [employer] [place], [address]   ENDOSCOPY PROCEDURE REPORT  PATIENT: Samantha, Mckenzie  MR#: [id number] BIRTHDATE: [birth year] , 78  yrs. old GENDER: female ENDOSCOPIST: Eustace Quail, MD REFERRED BY:  Kathlene November, M.D. PROCEDURE DATE:  01/13/2015 PROCEDURE:  EGD, diagnostic ASA CLASS:     Class II INDICATIONS:  history of esophageal reflux. MEDICATIONS: Monitored anesthesia care and Propofol 100 mg IV TOPICAL ANESTHETIC: none  DESCRIPTION OF PROCEDURE: After the risks benefits and alternatives of the procedure were thoroughly explained, informed consent was obtained.  The LB [id number] endoscope was introduced through the mouth and advanced to the second portion of the duodenum , Without limitations.  The instrument was slowly withdrawn as the mucosa was fully examined.     EXAM: The esophagus and gastroesophageal junction were completely normal in appearance.  The stomach was entered and closely examined.The antrum, angularis, and lesser curvature were well visualized, including a retroflexed view of the cardia and fundus. The stomach wall was normally distensable.  The scope passed easily through the pylorus into the duodenum.  Retroflexed views revealed no abnormalities.     The scope was then withdrawn from the patient and the procedure completed.  COMPLICATIONS: There were no immediate complications.  ENDOSCOPIC IMPRESSION: 1. Normal EGD 2. GERD  RECOMMENDATIONS: 1. Continue Pepcid (famotidine) 40 mg twice daily . May need to increase if reflux symptoms persist  REPEAT EXAM:  eSigned:  Eustace Quail, MD 01/13/2015 9:13 AM    CB:ULAG Larose Kells, MD and The Patient

## 2015-01-13 NOTE — Patient Instructions (Signed)
Discharge instructions given. Normal exam. Resume previous medications. YOU HAD AN ENDOSCOPIC PROCEDURE TODAY AT THE [facility]:   Refer to the procedure report that was given to you for any specific questions about what was found during the examination.  If the procedure report does not answer your questions, please call your gastroenterologist to clarify.  If you requested that your care partner not be given the details of your procedure findings, then the procedure report has been included in a sealed envelope for you to review at your convenience later.  YOU SHOULD EXPECT: Some feelings of bloating in the abdomen. Passage of more gas than usual.  Walking can help get rid of the air that was put into your GI tract during the procedure and reduce the bloating. If you had a lower endoscopy (such as a colonoscopy or flexible sigmoidoscopy) you may notice spotting of blood in your stool or on the toilet paper. If you underwent a bowel prep for your procedure, you may not have a normal bowel movement for a few days.  Please Note:  You might notice some irritation and congestion in your nose or some drainage.  This is from the oxygen used during your procedure.  There is no need for concern and it should clear up in a day or so.  SYMPTOMS TO REPORT IMMEDIATELY:    Following upper endoscopy (EGD)  Vomiting of blood or coffee ground material  New chest pain or pain under the shoulder blades  Painful or persistently difficult swallowing  New shortness of breath  Fever of 100F or higher  Black, tarry-looking stools  For urgent or emergent issues, a gastroenterologist can be reached at any hour by calling [phone].   DIET: Your first meal following the procedure should be a small meal and then it is ok to progress to your normal diet. Heavy or fried foods are harder to digest and may make you feel nauseous or bloated.  Likewise, meals heavy in dairy and vegetables can increase  bloating.  Drink plenty of fluids but you should avoid alcoholic beverages for 24 hours.  ACTIVITY:  You should plan to take it easy for the rest of today and you should NOT DRIVE or use heavy machinery until tomorrow (because of the sedation medicines used during the test).    FOLLOW UP: Our staff will call the number listed on your records the next business day following your procedure to check on you and address any questions or concerns that you may have regarding the information given to you following your procedure. If we do not reach you, we will leave a message.  However, if you are feeling well and you are not experiencing any problems, there is no need to return our call.  We will assume that you have returned to your regular daily activities without incident.  If any biopsies were taken you will be contacted by phone or by letter within the next 1-3 weeks.  Please call us at [phone] if you have not heard about the biopsies in 3 weeks.    SIGNATURES/CONFIDENTIALITY: You and/or your care partner have signed paperwork which will be entered into your electronic medical record.  These signatures attest to the fact that that the information above on your After Visit Summary has been reviewed and is understood.  Full responsibility of the confidentiality of this discharge information lies with you and/or your care-partner. 

## 2015-01-14 ENCOUNTER — Telehealth: Payer: Self-pay | Admitting: *Deleted

## 2015-01-14 NOTE — Telephone Encounter (Signed)
  Follow up Call-  Call back number 01/13/2015  Post procedure Call Back phone  # [phone]  Permission to leave phone message Yes     Patient questions:  Do you have a fever, pain , or abdominal swelling? No. Pain Score  0 *  Have you tolerated food without any problems? Yes.    Have you been able to return to your normal activities? Yes.    Do you have any questions about your discharge instructions: Diet   No. Medications  No. Follow up visit  No.  Do you have questions or concerns about your Care? No.  Actions: * If pain score is 4 or above: No action needed, pain <4.

## 2015-01-17 ENCOUNTER — Other Ambulatory Visit: Payer: Self-pay | Admitting: Internal Medicine

## 2015-01-17 DIAGNOSIS — I10 Essential (primary) hypertension: Secondary | ICD-10-CM | POA: Diagnosis not present

## 2015-01-17 DIAGNOSIS — D649 Anemia, unspecified: Secondary | ICD-10-CM | POA: Diagnosis not present

## 2015-01-17 DIAGNOSIS — I2510 Atherosclerotic heart disease of native coronary artery without angina pectoris: Secondary | ICD-10-CM | POA: Diagnosis not present

## 2015-01-17 DIAGNOSIS — R079 Chest pain, unspecified: Secondary | ICD-10-CM | POA: Diagnosis not present

## 2015-01-17 DIAGNOSIS — I252 Old myocardial infarction: Secondary | ICD-10-CM | POA: Diagnosis not present

## 2015-01-17 DIAGNOSIS — E119 Type 2 diabetes mellitus without complications: Secondary | ICD-10-CM | POA: Diagnosis not present

## 2015-01-20 DIAGNOSIS — N184 Chronic kidney disease, stage 4 (severe): Secondary | ICD-10-CM | POA: Diagnosis not present

## 2015-01-20 DIAGNOSIS — K219 Gastro-esophageal reflux disease without esophagitis: Secondary | ICD-10-CM | POA: Diagnosis not present

## 2015-01-20 DIAGNOSIS — E785 Hyperlipidemia, unspecified: Secondary | ICD-10-CM | POA: Diagnosis not present

## 2015-01-20 DIAGNOSIS — I129 Hypertensive chronic kidney disease with stage 1 through stage 4 chronic kidney disease, or unspecified chronic kidney disease: Secondary | ICD-10-CM | POA: Diagnosis not present

## 2015-01-20 LAB — BASIC METABOLIC PANEL
BUN: 39 mg/dL — AB (ref 4–21)
Creatinine: 2 mg/dL — AB (ref <=1.1)
GLUCOSE: 112 mg/dL
POTASSIUM: 5.7 mmol/L — AB (ref 3.4–5.3)
SODIUM: 139 mmol/L (ref 137–147)

## 2015-01-20 LAB — CBC AND DIFFERENTIAL: HEMOGLOBIN: 10.8 g/dL — AB (ref 12.0–16.0)

## 2015-03-12 DIAGNOSIS — N189 Chronic kidney disease, unspecified: Secondary | ICD-10-CM | POA: Diagnosis not present

## 2015-03-12 DIAGNOSIS — R221 Localized swelling, mass and lump, neck: Secondary | ICD-10-CM | POA: Diagnosis not present

## 2015-03-12 DIAGNOSIS — I2510 Atherosclerotic heart disease of native coronary artery without angina pectoris: Secondary | ICD-10-CM | POA: Diagnosis not present

## 2015-03-12 DIAGNOSIS — E785 Hyperlipidemia, unspecified: Secondary | ICD-10-CM | POA: Diagnosis not present

## 2015-03-12 DIAGNOSIS — D649 Anemia, unspecified: Secondary | ICD-10-CM | POA: Diagnosis not present

## 2015-03-12 DIAGNOSIS — I10 Essential (primary) hypertension: Secondary | ICD-10-CM | POA: Diagnosis not present

## 2015-03-12 DIAGNOSIS — E119 Type 2 diabetes mellitus without complications: Secondary | ICD-10-CM | POA: Diagnosis not present

## 2015-03-13 DIAGNOSIS — I10 Essential (primary) hypertension: Secondary | ICD-10-CM | POA: Diagnosis not present

## 2015-03-27 ENCOUNTER — Other Ambulatory Visit: Payer: Self-pay | Admitting: Internal Medicine

## 2015-04-10 ENCOUNTER — Ambulatory Visit: Payer: Medicare Other | Admitting: Internal Medicine

## 2015-06-16 ENCOUNTER — Other Ambulatory Visit: Payer: Self-pay | Admitting: Internal Medicine

## 2015-06-18 DIAGNOSIS — D649 Anemia, unspecified: Secondary | ICD-10-CM | POA: Diagnosis not present

## 2015-06-18 DIAGNOSIS — R0789 Other chest pain: Secondary | ICD-10-CM | POA: Diagnosis not present

## 2015-06-18 DIAGNOSIS — N189 Chronic kidney disease, unspecified: Secondary | ICD-10-CM | POA: Diagnosis not present

## 2015-06-18 DIAGNOSIS — E785 Hyperlipidemia, unspecified: Secondary | ICD-10-CM | POA: Diagnosis not present

## 2015-06-18 DIAGNOSIS — E119 Type 2 diabetes mellitus without complications: Secondary | ICD-10-CM | POA: Diagnosis not present

## 2015-06-18 DIAGNOSIS — I10 Essential (primary) hypertension: Secondary | ICD-10-CM | POA: Diagnosis not present

## 2015-06-18 DIAGNOSIS — I2510 Atherosclerotic heart disease of native coronary artery without angina pectoris: Secondary | ICD-10-CM | POA: Diagnosis not present

## 2015-06-19 DIAGNOSIS — E785 Hyperlipidemia, unspecified: Secondary | ICD-10-CM | POA: Diagnosis not present

## 2015-06-19 DIAGNOSIS — E119 Type 2 diabetes mellitus without complications: Secondary | ICD-10-CM | POA: Diagnosis not present

## 2015-06-19 DIAGNOSIS — I10 Essential (primary) hypertension: Secondary | ICD-10-CM | POA: Diagnosis not present

## 2015-06-26 ENCOUNTER — Other Ambulatory Visit: Payer: Self-pay | Admitting: Internal Medicine

## 2015-07-09 ENCOUNTER — Ambulatory Visit: Payer: TRICARE For Life (TFL)

## 2015-07-11 ENCOUNTER — Encounter: Payer: Self-pay | Admitting: Internal Medicine

## 2015-07-11 ENCOUNTER — Ambulatory Visit (INDEPENDENT_AMBULATORY_CARE_PROVIDER_SITE_OTHER): Payer: Medicare Other | Admitting: Internal Medicine

## 2015-07-11 ENCOUNTER — Ambulatory Visit (HOSPITAL_BASED_OUTPATIENT_CLINIC_OR_DEPARTMENT_OTHER)
Admission: RE | Admit: 2015-07-11 | Discharge: 2015-07-11 | Disposition: A | Discharge status: Home / Self Care | Payer: Medicare Other | Source: Ambulatory Visit | Attending: Internal Medicine | Admitting: Internal Medicine

## 2015-07-11 VITALS — BP 126/52 | HR 62 | Temp 98.2°F | Ht 62.0 in | Wt 137.5 lb

## 2015-07-11 DIAGNOSIS — E119 Type 2 diabetes mellitus without complications: Secondary | ICD-10-CM

## 2015-07-11 DIAGNOSIS — M150 Primary generalized (osteo)arthritis: Secondary | ICD-10-CM

## 2015-07-11 DIAGNOSIS — M159 Polyosteoarthritis, unspecified: Secondary | ICD-10-CM

## 2015-07-11 DIAGNOSIS — K219 Gastro-esophageal reflux disease without esophagitis: Secondary | ICD-10-CM | POA: Diagnosis not present

## 2015-07-11 DIAGNOSIS — M79641 Pain in right hand: Secondary | ICD-10-CM | POA: Diagnosis not present

## 2015-07-11 DIAGNOSIS — M154 Erosive (osteo)arthritis: Secondary | ICD-10-CM

## 2015-07-11 DIAGNOSIS — M1990 Unspecified osteoarthritis, unspecified site: Secondary | ICD-10-CM

## 2015-07-11 DIAGNOSIS — Z09 Encounter for follow-up examination after completed treatment for conditions other than malignant neoplasm: Secondary | ICD-10-CM | POA: Insufficient documentation

## 2015-07-11 DIAGNOSIS — I10 Essential (primary) hypertension: Secondary | ICD-10-CM

## 2015-07-11 DIAGNOSIS — M79642 Pain in left hand: Secondary | ICD-10-CM | POA: Diagnosis not present

## 2015-07-11 DIAGNOSIS — M7989 Other specified soft tissue disorders: Secondary | ICD-10-CM | POA: Insufficient documentation

## 2015-07-11 DIAGNOSIS — Z23 Encounter for immunization: Secondary | ICD-10-CM

## 2015-07-11 DIAGNOSIS — M19042 Primary osteoarthritis, left hand: Secondary | ICD-10-CM | POA: Diagnosis not present

## 2015-07-11 LAB — BASIC METABOLIC PANEL
BUN: 48 mg/dL — AB (ref 6–23)
CHLORIDE: 103 meq/L (ref 96–112)
CO2: 29 mEq/L (ref 19–32)
Calcium: 9.8 mg/dL (ref 8.4–10.5)
Creatinine, Ser: 2.15 mg/dL — ABNORMAL HIGH (ref 0.40–1.20)
GFR: 23.63 mL/min — ABNORMAL LOW (ref >60.00)
Glucose, Bld: 119 mg/dL — ABNORMAL HIGH (ref 70–99)
Potassium: 5.3 mEq/L — ABNORMAL HIGH (ref 3.5–5.1)
Sodium: 139 mEq/L (ref 135–145)

## 2015-07-11 LAB — HEMOGLOBIN A1C: Hgb A1c MFr Bld: 6.6 % — ABNORMAL HIGH (ref 4.6–6.5)

## 2015-07-11 NOTE — Patient Instructions (Signed)
Get your blood work before you leave   Stop by the first floor and get the XR    Check the  blood pressure  weekly  Be sure your blood pressure is between 110/65 and  145/85.  if it is consistently higher or lower, let me know   Next visit  for a complete physical exam in 3 months, fasting.    Please schedule an appointment at the front desk

## 2015-07-11 NOTE — Progress Notes (Signed)
Subjective:    Patient ID: Samantha Mckenzie, female    DOB: 30-Oct-1935, 79 y.o.   MRN: [id number]  DOS:  07/11/2015 Type of visit - description : Routine office visit Interval history: Hypertension: Good compliance of medication, ambulatory BPs within normal per patient. GERD: Have a normal egd in April, currently on Nexium as needed. Diabetes, on chart reviewed, due for a A1c Complain of pain at the hands mostly DIPs PIPs, different joints on different days.    Review of Systems Denies chest pain or difficulty breathing No nausea, vomiting, diarrhea. Denies depression per se  Past Medical History  Diagnosis Date  . Breast CA     surgery, chemo, XRT; had peripheral neuropathy (imbalance at times) after chemo  . Anemia   . Gout   . Hyperlipidemia   . Hypertension   . Renal insufficiency   . Osteoarthritis   . GERD (gastroesophageal reflux disease)     gastritis, EGD 02/2007  . Allergic rhinitis   . Depression   . Macular degeneration 2011  . Cellulitis     Left arm, recurrent   . Normal cardiac stress test 12/2014  . Fatigue     Past Surgical History  Procedure Laterality Date  . Mastectomy Left 1999    and lymphnodes   . Lung biopsy  1999    neg  . Cataracts bilaterally  1 -2012    Social History   Social History  . Marital Status: Married    Spouse Name: N/A  . Number of Children: 2  . Years of Education: N/A   Occupational History  . retired     Social History Main Topics  . Smoking status: Never Smoker   . Smokeless tobacco: Never Used  . Alcohol Use: 0.0 oz/week    0 Glasses of wine per week     Comment: rarely  . Drug Use: No  . Sexual Activity: Not on file   Other Topics Concern  . Not on file   Social History Narrative   Lives w/ husband , 2 children in [place], 2 [place]                Medication List       This list is accurate as of: 07/11/15  5:54 PM.  Always use your most recent med list.               allopurinol 100 MG tablet   Commonly known as:  ZYLOPRIM  Take 1 tablet (100 mg total) by mouth daily.     amLODipine 10 MG tablet  Commonly known as:  NORVASC  Take 10 mg by mouth at bedtime.     aspirin 81 MG tablet  Take 81 mg by mouth daily.     atorvastatin 20 MG tablet  Commonly known as:  LIPITOR  Take 1 tablet (20 mg total) by mouth daily.     CENTRUM SILVER PO  Take 1 tablet by mouth daily.     esomeprazole 40 MG capsule  Commonly known as:  NEXIUM  Take 40 mg by mouth daily at 12 noon.     gabapentin 100 MG capsule  Commonly known as:  NEURONTIN  TAKE 1 TO 2 CAPSULES (100 TO 200 MG TOTAL) AT BEDTIME     metoprolol tartrate 25 MG tablet  Commonly known as:  LOPRESSOR  Take 1 tablet (25 mg total) by mouth daily.     valsartan 320 MG tablet  Commonly known as:  DIOVAN  Take 160 mg by mouth daily.     vitamin B-12 1000 MCG tablet  Commonly known as:  CYANOCOBALAMIN  Take 1,000 mcg by mouth daily.     Vitamin D3 1000 UNITS Caps  Take 1 capsule by mouth daily.           Objective:   Physical Exam BP 126/52 mmHg  Pulse 62  Temp(Src) 98.2 F (36.8 C) (Oral)  Ht 5' 2"  (1.575 m)  Wt 137 lb 8 oz (62.37 kg)  BMI 25.14 kg/m2  SpO2 97% General:   Well developed, well nourished . NAD.  HEENT:  Normocephalic . Face symmetric, atraumatic Lungs:  CTA B Normal respiratory effort, no intercostal retractions, no accessory muscle use. Heart: RRR,  no murmur.  No pretibial edema bilaterally  MSK: Wrists normal, hands with some deformities consistent with DJD in the fingers, some of the DIPs are TTP but not  swollen , red or warm. Skin: Not pale. Not jaundice Neurologic:  alert & oriented X3.  Speech normal, gait appropriate for age and unassisted Psych--  Cognition and judgment appear intact.  Cooperative with normal attention span and concentration.  Behavior appropriate. No anxious or depressed appearing.      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 Labs potassium is slightly elevated, she has renal insufficiency, check a BMP Depression? Back in October 2015 the PHQ 9 was +, was prescribed Prozac, did not like it and self discontinue. Reports she is not depressed and does not need any medication. DJD: Pain at the fingers and physical findings consistent with DJD, will get x-rays ; she knows to avoid Motrin type of medications, recommend to take Tylenol. DM: Recommend a A1c. GERD: Normal endoscopy 01-2015, since the last time she was here she decided to take PPIs, on Nexium as needed prescribed at the elsewhere RTC 3 months

## 2015-07-11 NOTE — Assessment & Plan Note (Signed)
HTN: Seems to be well-controlled at present. Labs potassium is slightly elevated, she has renal insufficiency, check a BMP Depression? Back in October 2015 the PHQ 9 was +, was prescribed Prozac, did not like it and self discontinue. Reports she is not depressed and does not need any medication. DJD: Pain at the fingers and physical findings consistent with DJD, will get x-rays ; she knows to avoid Motrin type of medications, recommend to take Tylenol. DM: Recommend a A1c. GERD: Normal endoscopy 01-2015, since the last time she was here she decided to take PPIs, on Nexium as needed prescribed at the elsewhere RTC 3 months

## 2015-07-11 NOTE — Progress Notes (Signed)
Pre visit review using our clinic review tool, if applicable. No additional management support is needed unless otherwise documented below in the visit note. 

## 2015-07-14 ENCOUNTER — Inpatient Hospital Stay (HOSPITAL_BASED_OUTPATIENT_CLINIC_OR_DEPARTMENT_OTHER): Admission: RE | Admit: 2015-07-14 | Payer: TRICARE For Life (TFL) | Source: Ambulatory Visit

## 2015-07-15 ENCOUNTER — Other Ambulatory Visit: Payer: Self-pay | Admitting: Internal Medicine

## 2015-07-15 NOTE — Addendum Note (Signed)
Addended by: Wilfrid Lund on: 07/15/2015 02:13 PM   Modules accepted: Orders

## 2015-07-24 ENCOUNTER — Other Ambulatory Visit: Payer: Self-pay

## 2015-07-29 DIAGNOSIS — I129 Hypertensive chronic kidney disease with stage 1 through stage 4 chronic kidney disease, or unspecified chronic kidney disease: Secondary | ICD-10-CM | POA: Diagnosis not present

## 2015-07-29 DIAGNOSIS — E785 Hyperlipidemia, unspecified: Secondary | ICD-10-CM | POA: Diagnosis not present

## 2015-07-29 DIAGNOSIS — N184 Chronic kidney disease, stage 4 (severe): Secondary | ICD-10-CM | POA: Diagnosis not present

## 2015-07-29 DIAGNOSIS — K219 Gastro-esophageal reflux disease without esophagitis: Secondary | ICD-10-CM | POA: Diagnosis not present

## 2015-08-01 ENCOUNTER — Telehealth: Payer: Self-pay | Admitting: Internal Medicine

## 2015-08-01 MED ORDER — METOPROLOL TARTRATE 25 MG PO TABS: 25.0000 mg | ORAL_TABLET | Freq: Every day | ORAL | Status: DC

## 2015-08-01 NOTE — Telephone Encounter (Signed)
Rx sent 

## 2015-08-01 NOTE — Telephone Encounter (Signed)
Caller name: Jalise Zawistowski   Relationship to patient: Self   Can be reached: [phone]  Pharmacy: [facility], [place]  Reason for call: Pt is requesting a refill on her metoprolol tartrate Rx.

## 2015-08-29 ENCOUNTER — Other Ambulatory Visit: Payer: Self-pay

## 2015-08-29 DIAGNOSIS — Z1231 Encounter for screening mammogram for malignant neoplasm of breast: Secondary | ICD-10-CM

## 2015-08-29 DIAGNOSIS — Z9012 Acquired absence of left breast and nipple: Secondary | ICD-10-CM

## 2015-09-09 ENCOUNTER — Encounter: Payer: Self-pay | Admitting: Internal Medicine

## 2015-09-09 ENCOUNTER — Ambulatory Visit (INDEPENDENT_AMBULATORY_CARE_PROVIDER_SITE_OTHER): Payer: Medicare Other | Admitting: Internal Medicine

## 2015-09-09 VITALS — BP 130/76 | HR 67 | Temp 98.0°F | Resp 16 | Ht 62.0 in | Wt 141.2 lb

## 2015-09-09 DIAGNOSIS — N19 Unspecified kidney failure: Secondary | ICD-10-CM

## 2015-09-09 DIAGNOSIS — M542 Cervicalgia: Secondary | ICD-10-CM

## 2015-09-09 DIAGNOSIS — Z09 Encounter for follow-up examination after completed treatment for conditions other than malignant neoplasm: Secondary | ICD-10-CM | POA: Diagnosis not present

## 2015-09-09 NOTE — Progress Notes (Signed)
Pre visit review using our clinic review tool, if applicable. No additional management support is needed unless otherwise documented below in the visit note. 

## 2015-09-09 NOTE — Patient Instructions (Signed)
Tylenol  500 mg OTC 2 tabs a day every 8 hours as needed for pain  Use ICE twice a day  Call if severe symptoms or if not better in few days

## 2015-09-09 NOTE — Progress Notes (Signed)
Subjective:    Patient ID: Samantha Mckenzie, female    DOB: [birth year], 79 y.o.   MRN: [id number]  DOS:  09/09/2015 Type of visit - description : Acute Interval history: This morning, woke up with pain at the right side of the neck. Denies any radiation. Pain worse by moving her head. She took Tylenol and applied ice and she now feels much better.   Review of Systems No upper or lower extremity paresthesias No headaches or shoulder pain Denies any fall or injury  Past Medical History  Diagnosis Date  . Breast CA ([place])     surgery, chemo, XRT; had peripheral neuropathy (imbalance at times) after chemo  . Anemia   . Gout   . Hyperlipidemia   . Hypertension   . Renal insufficiency   . Osteoarthritis   . GERD (gastroesophageal reflux disease)     gastritis, EGD 02/2007  . Allergic rhinitis   . Depression   . Macular degeneration 2011  . Cellulitis     Left arm, recurrent   . Normal cardiac stress test 12/2014  . Fatigue   . Chronic renal disease, stage IV (HCC)     Dr. Moshe Cipro    Past Surgical History  Procedure Laterality Date  . Mastectomy Left 1999    and lymphnodes   . Lung biopsy  1999    neg  . Cataracts bilaterally  1 -2012    Social History   Social History  . Marital Status: Married    Spouse Name: N/A  . Number of Children: 2  . Years of Education: N/A   Occupational History  . retired     Social History Main Topics  . Smoking status: Never Smoker   . Smokeless tobacco: Never Used  . Alcohol Use: 0.0 oz/week    0 Glasses of wine per week     Comment: rarely  . Drug Use: No  . Sexual Activity: Not on file   Other Topics Concern  . Not on file   Social History Narrative   Lives w/ husband , 2 children in [place], 2 [place]                Medication List       This list is accurate as of: 09/09/15 11:59 PM.  Always use your most recent med list.               allopurinol 100 MG tablet  Commonly known as:  ZYLOPRIM  Take 1 tablet  (100 mg total) by mouth daily.     amLODipine 10 MG tablet  Commonly known as:  NORVASC  Take 10 mg by mouth at bedtime.     aspirin 81 MG tablet  Take 81 mg by mouth daily.     atorvastatin 20 MG tablet  Commonly known as:  LIPITOR  Take 1 tablet (20 mg total) by mouth daily.     CENTRUM SILVER PO  Take 1 tablet by mouth daily.     esomeprazole 40 MG capsule  Commonly known as:  NEXIUM  Take 40 mg by mouth daily at 12 noon.     gabapentin 100 MG capsule  Commonly known as:  NEURONTIN  TAKE 1 TO 2 CAPSULES (100 TO 200 MG TOTAL) AT BEDTIME     metoprolol tartrate 25 MG tablet  Commonly known as:  LOPRESSOR  Take 1 tablet (25 mg total) by mouth daily.     valsartan 320 MG tablet  Commonly known  as:  DIOVAN  Take 160 mg by mouth daily.     vitamin B-12 1000 MCG tablet  Commonly known as:  CYANOCOBALAMIN  Take 1,000 mcg by mouth daily.     Vitamin D3 1000 UNITS Caps  Take 1 capsule by mouth daily.           Objective:   Physical Exam BP 130/76 mmHg  Pulse 67  Temp(Src) 98 F (36.7 C) (Oral)  Resp 16  Ht 5' 2"  (1.575 m)  Wt 141 lb 3.2 oz (64.048 kg)  BMI 25.82 kg/m2  SpO2 98% General:   Well developed, well nourished . NAD.  HEENT:  Normocephalic . Face symmetric, atraumatic Neck: Normal to inspection on palpation, no TTP, range of motion normal although she did report pain when she turned her head to the right. Lungs:  CTA B Normal respiratory effort, no intercostal retractions, no accessory muscle use. Heart: RRR,  no murmur.  No pretibial edema bilaterally  Skin: Not pale. Not jaundice Neurologic:  alert & oriented X3.  Speech normal, gait appropriate for age and unassisted. Motor and DTRs symmetric Psych--  Cognition and judgment appear intact.  Cooperative with normal attention span and concentration.  Behavior appropriate. No anxious or depressed appearing.      Assessment & Plan:    Assessment> DM HTN Hyperlipidemia Depression Renal  insufficiency-- sees nephrology Gout GERD H/o anemia --- no iron def , Cscope 2012, normal EGD 01-2015 Breast cancer:  --S/p Surgery (L mastectomy), chemotherapy, XRT --Peripheral neuropathy felt to be due to chemotherapy --Recurrent left arm cellulitis DJD Macular degeneration 2011 Normal stress test 12-2014  PLAN Neck pain: Likely self-limited MSK pain. Recommend to continue Tylenol and ice and call if not better. Reminded  not to take NSAIDs. Renal insufficiency and increased potassium: Diet discussed.

## 2015-09-10 NOTE — Assessment & Plan Note (Signed)
Neck pain: Likely self-limited MSK pain. Recommend to continue Tylenol and ice and call if not better. Reminded  not to take NSAIDs. Renal insufficiency and increased potassium: Diet discussed.

## 2015-09-15 ENCOUNTER — Other Ambulatory Visit: Payer: Self-pay | Admitting: Internal Medicine

## 2015-09-20 DIAGNOSIS — R51 Headache: Secondary | ICD-10-CM | POA: Diagnosis not present

## 2015-09-20 DIAGNOSIS — M542 Cervicalgia: Secondary | ICD-10-CM | POA: Diagnosis not present

## 2015-09-26 ENCOUNTER — Other Ambulatory Visit: Payer: Self-pay

## 2015-09-26 ENCOUNTER — Other Ambulatory Visit: Payer: Self-pay | Admitting: Internal Medicine

## 2015-10-07 ENCOUNTER — Ambulatory Visit
Admission: RE | Admit: 2015-10-07 | Discharge: 2015-10-07 | Disposition: A | Discharge status: Home / Self Care | Payer: Medicare Other | Source: Ambulatory Visit

## 2015-10-07 DIAGNOSIS — Z9012 Acquired absence of left breast and nipple: Secondary | ICD-10-CM

## 2015-10-07 DIAGNOSIS — Z1231 Encounter for screening mammogram for malignant neoplasm of breast: Secondary | ICD-10-CM

## 2015-11-04 ENCOUNTER — Other Ambulatory Visit: Payer: Self-pay

## 2015-11-04 MED ORDER — METOPROLOL TARTRATE 25 MG PO TABS: 25.0000 mg | ORAL_TABLET | Freq: Every day | ORAL | Status: DC

## 2015-11-18 ENCOUNTER — Telehealth: Payer: Self-pay | Admitting: Behavioral Health

## 2015-11-18 NOTE — Telephone Encounter (Signed)
Spoke to the patient's spouse and he voiced that he will have his wife to return the call later for pre-visit info.

## 2015-11-19 ENCOUNTER — Encounter: Payer: Self-pay | Admitting: Internal Medicine

## 2015-11-19 ENCOUNTER — Ambulatory Visit (INDEPENDENT_AMBULATORY_CARE_PROVIDER_SITE_OTHER): Payer: Medicare Other | Admitting: Internal Medicine

## 2015-11-19 VITALS — BP 122/52 | HR 58 | Temp 97.8°F | Ht 62.0 in | Wt 139.5 lb

## 2015-11-19 DIAGNOSIS — N19 Unspecified kidney failure: Secondary | ICD-10-CM | POA: Diagnosis not present

## 2015-11-19 DIAGNOSIS — E1122 Type 2 diabetes mellitus with diabetic chronic kidney disease: Secondary | ICD-10-CM | POA: Diagnosis not present

## 2015-11-19 DIAGNOSIS — N182 Chronic kidney disease, stage 2 (mild): Secondary | ICD-10-CM

## 2015-11-19 DIAGNOSIS — I10 Essential (primary) hypertension: Secondary | ICD-10-CM

## 2015-11-19 DIAGNOSIS — Z0000 Encounter for general adult medical examination without abnormal findings: Secondary | ICD-10-CM | POA: Diagnosis not present

## 2015-11-19 DIAGNOSIS — E785 Hyperlipidemia, unspecified: Secondary | ICD-10-CM | POA: Diagnosis not present

## 2015-11-19 DIAGNOSIS — E119 Type 2 diabetes mellitus without complications: Secondary | ICD-10-CM | POA: Diagnosis not present

## 2015-11-19 LAB — COMPREHENSIVE METABOLIC PANEL
ALT: 17 U/L (ref 0–35)
AST: 21 U/L (ref 0–37)
Albumin: 3.8 g/dL (ref 3.5–5.2)
Alkaline Phosphatase: 64 U/L (ref 39–117)
BILIRUBIN TOTAL: 0.3 mg/dL (ref 0.2–1.2)
BUN: 48 mg/dL — AB (ref 6–23)
CALCIUM: 9.5 mg/dL (ref 8.4–10.5)
CHLORIDE: 105 meq/L (ref 96–112)
CO2: 28 meq/L (ref 19–32)
CREATININE: 2.11 mg/dL — AB (ref 0.40–1.20)
GFR: 24.13 mL/min — ABNORMAL LOW (ref >60.00)
GLUCOSE: 117 mg/dL — AB (ref 70–99)
Potassium: 5.2 mEq/L — ABNORMAL HIGH (ref 3.5–5.1)
SODIUM: 140 meq/L (ref 135–145)
Total Protein: 7.5 g/dL (ref 6.0–8.3)

## 2015-11-19 LAB — LIPID PANEL
CHOL/HDL RATIO: 4
Cholesterol: 176 mg/dL (ref 0–200)
HDL: 47.6 mg/dL (ref >39.00)
NonHDL: 128.24
Triglycerides: 233 mg/dL — ABNORMAL HIGH (ref 0.0–149.0)
VLDL: 46.6 mg/dL — ABNORMAL HIGH (ref 0.0–40.0)

## 2015-11-19 LAB — LDL CHOLESTEROL, DIRECT: LDL DIRECT: 71 mg/dL

## 2015-11-19 LAB — HEMOGLOBIN A1C: HEMOGLOBIN A1C: 6.6 % — AB (ref 4.6–6.5)

## 2015-11-19 NOTE — Patient Instructions (Signed)
Get your blood work before you leave    Please consider visit these websites for more information:  www.begintheconversation.org  theconversationproject.org   Next visit in 6 months, routine checkup, no fasting.    Fall Prevention and Home Safety Falls cause injuries and can affect all age groups. It is possible to use preventive measures to significantly decrease the likelihood of falls. There are many simple measures which can make your home safer and prevent falls. OUTDOORS  Repair cracks and edges of walkways and driveways.  Remove high doorway thresholds.  Trim shrubbery on the main path into your home.  Have good outside lighting.  Clear walkways of tools, rocks, debris, and clutter.  Check that handrails are not broken and are securely fastened. Both sides of steps should have handrails.  Have leaves, snow, and ice cleared regularly.  Use sand or salt on walkways during winter months.  In the garage, clean up grease or oil spills. BATHROOM  Install night lights.  Install grab bars by the toilet and in the tub and shower.  Use non-skid mats or decals in the tub or shower.  Place a plastic non-slip stool in the shower to sit on, if needed.  Keep floors dry and clean up all water on the floor immediately.  Remove soap buildup in the tub or shower on a regular basis.  Secure bath mats with non-slip, double-sided rug tape.  Remove throw rugs and tripping hazards from the floors. BEDROOMS  Install night lights.  Make sure a bedside light is easy to reach.  Do not use oversized bedding.  Keep a telephone by your bedside.  Have a firm chair with side arms to use for getting dressed.  Remove throw rugs and tripping hazards from the floor. KITCHEN  Keep handles on pots and pans turned toward the center of the stove. Use back burners when possible.  Clean up spills quickly and allow time for drying.  Avoid walking on wet floors.  Avoid hot utensils  and knives.  Position shelves so they are not too high or low.  Place commonly used objects within easy reach.  If necessary, use a sturdy step stool with a grab bar when reaching.  Keep electrical cables out of the way.  Do not use floor polish or wax that makes floors slippery. If you must use wax, use non-skid floor wax.  Remove throw rugs and tripping hazards from the floor. STAIRWAYS  Never leave objects on stairs.  Place handrails on both sides of stairways and use them. Fix any loose handrails. Make sure handrails on both sides of the stairways are as long as the stairs.  Check carpeting to make sure it is firmly attached along stairs. Make repairs to worn or loose carpet promptly.  Avoid placing throw rugs at the top or bottom of stairways, or properly secure the rug with carpet tape to prevent slippage. Get rid of throw rugs, if possible.  Have an electrician put in a light switch at the top and bottom of the stairs. OTHER FALL PREVENTION TIPS  Wear low-heel or rubber-soled shoes that are supportive and fit well. Wear closed toe shoes.  When using a stepladder, make sure it is fully opened and both spreaders are firmly locked. Do not climb a closed stepladder.  Add color or contrast paint or tape to grab bars and handrails in your home. Place contrasting color strips on first and last steps.  Learn and use mobility aids as needed. Install an Dealer  emergency response system.  Turn on lights to avoid dark areas. Replace light bulbs that burn out immediately. Get light switches that glow.  Arrange furniture to create clear pathways. Keep furniture in the same place.  Firmly attach carpet with non-skid or double-sided tape.  Eliminate uneven floor surfaces.  Select a carpet pattern that does not visually hide the edge of steps.  Be aware of all pets. OTHER HOME SAFETY TIPS  Set the water temperature for 120 F (48.8 C).  Keep emergency numbers on or near  the telephone.  Keep smoke detectors on every level of the home and near sleeping areas. Document Released: 09/17/2002 Document Revised: 03/28/2012 Document Reviewed: 12/17/2011 [facility] Patient Information 2015 [place], [place]. This information is not intended to replace advice given to you by your health care provider. Make sure you discuss any questions you have with your health care provider.   Preventive Care for Adults Ages 50 and over  Blood pressure check.** / Every 1 to 2 years.  Lipid and cholesterol check.**/ Every 5 years beginning at age 47.  Lung cancer screening. / Every year if you are aged 53-80 years and have a 30-pack-year history of smoking and currently smoke or have quit within the past 15 years. Yearly screening is stopped once you have quit smoking for at least 15 years or develop a health problem that would prevent you from having lung cancer treatment.  Fecal occult blood test (FOBT) of stool. / Every year beginning at age 37 and continuing until age 71. You may not have to do this test if you get a colonoscopy every 10 years.  Flexible sigmoidoscopy** or colonoscopy.** / Every 5 years for a flexible sigmoidoscopy or every 10 years for a colonoscopy beginning at age 9 and continuing until age 61.  Hepatitis C blood test.** / For all people born from [birth year] through 1963 and any individual with known risks for hepatitis C.  Abdominal aortic aneurysm (AAA) screening.** / A one-time screening for ages 69 to 22 years who are current or former smokers.  Skin self-exam. / Monthly.  Influenza vaccine. / Every year.  Tetanus, diphtheria, and acellular pertussis (Tdap/Td) vaccine.** / 1 dose of Td every 10 years.  Varicella vaccine.** / Consult your health care provider.  Zoster vaccine.** / 1 dose for adults aged 46 years or older.  Pneumococcal 13-valent conjugate (PCV13) vaccine.** / Consult your health care provider.  Pneumococcal polysaccharide (PPSV23)  vaccine.** / 1 dose for all adults aged 18 years and older.  Meningococcal vaccine.** / Consult your health care provider.  Hepatitis A vaccine.** / Consult your health care provider.  Hepatitis B vaccine.** / Consult your health care provider.  Haemophilus influenzae type b (Hib) vaccine.** / Consult your health care provider. **Family history and personal history of risk and conditions may change your health care provider's recommendations. Document Released: 11/23/2001 Document Revised: 10/02/2013 Document Reviewed: 02/22/2011 [facility] Patient Information 2015 [place], [place]. This information is not intended to replace advice given to you by your health care provider. Make sure you discuss any questions you have with your health care provider.

## 2015-11-19 NOTE — Progress Notes (Signed)
Subjective:    Patient ID: Samantha Mckenzie, female    DOB: May 19, 1936, 80 y.o.   MRN: [id number]  DOS:  11/19/2015 Type of visit - description :   Here for Medicare AWV:   1. Risk factors based on Past M, S, F history: reviewed   2. Physical Activities: occ takes a walk, 1-2 miles depending on the weather   3. Depression/mood:  neg screening 4. Hearing: No problems noted or reported   5. ADL's: Independent, still drives   6. Fall Risk: no recent problems , see instructions   7. home Safety: does feel safe at home   8. Height, weight, &visual acuity: see VS,rec to see ophthalmology q year, will call if referral needed  9. Counseling: provided   10. Labs ordered based on risk factors: if needed   11. Referral Coordination: if needed   12. Care Plan, see assessment and plan   13. Cognitive Assessment: Motor skills  seem appropriate for age, cognition normal   14. Care team updated  15. Written plan of care provided  16. HC-POA discussed   In addition, today we discussed the following:  DM: diet control, due for A1c CAD ?- states she sees cardiology, no records HTN: On Diovan and beta blockers, BP today is excellent. CKD: Up-to-date on nephrology visits.   Review of Systems  Constitutional: No fever. No chills. No unexplained wt changes. No unusual sweats  HEENT: No dental problems, no ear discharge, no facial swelling, no voice changes. No eye discharge, no eye  redness , no  intolerance to light   Respiratory: No wheezing , no  difficulty breathing. No cough , no mucus production  Cardiovascular: No CP, no leg swelling , no  Palpitations  GI: no nausea, no vomiting, no diarrhea , no  abdominal pain.  No blood in the stools. No dysphagia, no odynophagia    Endocrine: No polyphagia, no polyuria , no polydipsia  GU: No dysuria, gross hematuria, difficulty urinating. No urinary urgency, no frequency.  Musculoskeletal: No joint swellings or unusual aches or pains  Skin:  No change in the color of the skin, palor , no  Rash  Allergic, immunologic: No environmental allergies , no  food allergies  Neurological: No dizziness no  syncope. No headaches. No diplopia, no slurred, no slurred speech, no motor deficits, no facial  Numbness  Hematological: No enlarged lymph nodes, no easy bruising , no unusual bleedings  Psychiatry: No suicidal ideas, no hallucinations, no beavior problems, no confusion.  No unusual/severe anxiety, no depression  Past Medical History  Diagnosis Date  . Breast CA ([facility])     surgery, chemo, XRT; had peripheral neuropathy (imbalance at times) after chemo  . Anemia   . Gout   . Hyperlipidemia   . Hypertension   . Renal insufficiency   . Osteoarthritis   . GERD (gastroesophageal reflux disease)     gastritis, EGD 02/2007  . Allergic rhinitis   . Depression   . Macular degeneration 2011  . Cellulitis     Left arm, recurrent   . Normal cardiac stress test 12/2014  . Fatigue   . Chronic renal disease, stage IV (HCC)     Dr. Moshe Cipro    Past Surgical History  Procedure Laterality Date  . Mastectomy Left 1999    and lymphnodes   . Lung biopsy  1999    neg  . Cataracts bilaterally  1 -2012    Social History   Social History  .  Marital Status: Married    Spouse Name: N/A  . Number of Children: 2  . Years of Education: N/A   Occupational History  . retired     Social History Main Topics  . Smoking status: Never Smoker   . Smokeless tobacco: Never Used  . Alcohol Use: 0.0 oz/week    0 Glasses of wine per week     Comment: rarely  . Drug Use: No  . Sexual Activity: Not on file   Other Topics Concern  . Not on file   Social History Narrative   Lives w/ husband , 2 children in [place], [place]             Family History  Problem Relation Age of Onset  . Breast cancer Other     ? of   . Colon cancer Neg Hx   . Stomach cancer Neg Hx   . Heart attack Father 56  . Diabetes Sister     2  . Hypertension Sister         Medication List       This list is accurate as of: 11/19/15  2:05 PM.  Always use your most recent med list.               allopurinol 100 MG tablet  Commonly known as:  ZYLOPRIM  Take 1 tablet (100 mg total) by mouth daily.     amLODipine 5 MG tablet  Commonly known as:  NORVASC  Take 1 tablet (5 mg total) by mouth daily.     aspirin 81 MG tablet  Take 81 mg by mouth daily.     atorvastatin 20 MG tablet  Commonly known as:  LIPITOR  Take 1 tablet (20 mg total) by mouth daily.     CENTRUM SILVER PO  Take 1 tablet by mouth daily.     esomeprazole 40 MG capsule  Commonly known as:  NEXIUM  Take 40 mg by mouth daily at 12 noon.     gabapentin 100 MG capsule  Commonly known as:  NEURONTIN  Take 1-2 capsules (100-200 mg total) by mouth at bedtime.     metoprolol tartrate 25 MG tablet  Commonly known as:  LOPRESSOR  Take 1 tablet (25 mg total) by mouth daily.     valsartan 320 MG tablet  Commonly known as:  DIOVAN  Take 160 mg by mouth daily.     vitamin B-12 1000 MCG tablet  Commonly known as:  CYANOCOBALAMIN  Take 1,000 mcg by mouth daily.     Vitamin D3 1000 units Caps  Take 1 capsule by mouth daily.           Objective:   Physical Exam BP 122/52 mmHg  Pulse 58  Temp(Src) 97.8 F (36.6 C) (Oral)  Ht 5' 2"  (1.575 m)  Wt 139 lb 8 oz (63.277 kg)  BMI 25.51 kg/m2  SpO2 97% General:   Well developed, well nourished . NAD.  HEENT:  Normocephalic . Face symmetric, atraumatic. Neck: No thyromegaly, normal carotid pulses Lungs:  CTA B Normal respiratory effort, no intercostal retractions, no accessory muscle use. Heart: RRR,  no murmur.  no pretibial edema bilaterally  Abdomen:  Not distended, soft, non-tender. No rebound or rigidity.  Diabetic feet exam: + B Pedal pulses, pinprick examination normal  Neurologic:  alert & oriented X3.  Speech normal, gait appropriate for age and unassisted Psych--  Cognition and judgment appear intact.    Cooperative with normal attention  span and concentration.  Behavior appropriate. No anxious or depressed appearing.    Assessment & Plan:   Assessment> DM- neuropathy (paresthesias, nl pinprick exam), CKD HTN Hyperlipidemia: Lipitor intolerant? CAD? States see Dr Terrence Dupont, no OV  records. (-) stress test 12-2014 CKD -- sees nephrology Gout GERD, nl EGD 01-2015  H/o Depression Leg pain-- RLS vs neuropathy, on gabapentin since 2013, good results H/o anemia --- no iron def , Cscope 2012, normal EGD 01-2015 Breast cancer:  --S/p Surgery (L mastectomy), chemotherapy, XRT --Peripheral neuropathy felt to be due to chemotherapy --Recurrent left arm cellulitis DJD   PLAN Diabetes: Paresthesias, well-controlled with gabapentin, normal pinprick examination today. Check a A1c, continue with diet control. HTN: Continue Diovan and beta blockers, BP well-controlled. Hyperlipidemia: Currently on no medication, question of Lipitor intolerance before, check FLP. CKD: Follow-up by nephrology, check a BMP. Gout: Asymptomatic, on allopurinol RTC 6 months RTC

## 2015-11-19 NOTE — Addendum Note (Signed)
Addended by: Kathlene November E on: 11/19/2015 02:13 PM   Modules accepted: Miquel Dunn

## 2015-11-19 NOTE — Assessment & Plan Note (Addendum)
Td 2015;  Pneumovax:  2005, 2011; prevnar-- 2015; shingles shot Rx was provided before, states she got it  Had a Flu shot  No further PAPs see previous entry  History of breast cancer,had a R MMG this year, no report; declined breast exam today  DEXAs used to be rx by Dr Ubaldo Glassing,  told pt all DEXAs were neg, declined further dexas   Had a  Colonoscopy 2001 and 2012 (tics), next  Per GI Diet exercise discussed

## 2015-11-19 NOTE — Progress Notes (Signed)
Pre visit review using our clinic review tool, if applicable. No additional management support is needed unless otherwise documented below in the visit note. 

## 2015-12-15 DIAGNOSIS — I129 Hypertensive chronic kidney disease with stage 1 through stage 4 chronic kidney disease, or unspecified chronic kidney disease: Secondary | ICD-10-CM | POA: Diagnosis not present

## 2015-12-15 DIAGNOSIS — I2510 Atherosclerotic heart disease of native coronary artery without angina pectoris: Secondary | ICD-10-CM | POA: Diagnosis not present

## 2015-12-15 DIAGNOSIS — E785 Hyperlipidemia, unspecified: Secondary | ICD-10-CM | POA: Diagnosis not present

## 2015-12-15 DIAGNOSIS — E119 Type 2 diabetes mellitus without complications: Secondary | ICD-10-CM | POA: Diagnosis not present

## 2015-12-15 DIAGNOSIS — D649 Anemia, unspecified: Secondary | ICD-10-CM | POA: Diagnosis not present

## 2015-12-15 DIAGNOSIS — I252 Old myocardial infarction: Secondary | ICD-10-CM | POA: Diagnosis not present

## 2015-12-16 DIAGNOSIS — E785 Hyperlipidemia, unspecified: Secondary | ICD-10-CM | POA: Diagnosis not present

## 2015-12-16 DIAGNOSIS — I10 Essential (primary) hypertension: Secondary | ICD-10-CM | POA: Diagnosis not present

## 2015-12-16 DIAGNOSIS — E119 Type 2 diabetes mellitus without complications: Secondary | ICD-10-CM | POA: Diagnosis not present

## 2016-02-05 DIAGNOSIS — E785 Hyperlipidemia, unspecified: Secondary | ICD-10-CM | POA: Diagnosis not present

## 2016-02-05 DIAGNOSIS — M109 Gout, unspecified: Secondary | ICD-10-CM | POA: Diagnosis not present

## 2016-02-05 DIAGNOSIS — K219 Gastro-esophageal reflux disease without esophagitis: Secondary | ICD-10-CM | POA: Diagnosis not present

## 2016-02-05 DIAGNOSIS — I129 Hypertensive chronic kidney disease with stage 1 through stage 4 chronic kidney disease, or unspecified chronic kidney disease: Secondary | ICD-10-CM | POA: Diagnosis not present

## 2016-02-05 DIAGNOSIS — E875 Hyperkalemia: Secondary | ICD-10-CM | POA: Diagnosis not present

## 2016-02-05 DIAGNOSIS — N184 Chronic kidney disease, stage 4 (severe): Secondary | ICD-10-CM | POA: Diagnosis not present

## 2016-02-20 DIAGNOSIS — H02831 Dermatochalasis of right upper eyelid: Secondary | ICD-10-CM | POA: Diagnosis not present

## 2016-02-20 DIAGNOSIS — H527 Unspecified disorder of refraction: Secondary | ICD-10-CM | POA: Diagnosis not present

## 2016-02-20 DIAGNOSIS — H35373 Puckering of macula, bilateral: Secondary | ICD-10-CM | POA: Diagnosis not present

## 2016-02-20 DIAGNOSIS — H04123 Dry eye syndrome of bilateral lacrimal glands: Secondary | ICD-10-CM | POA: Diagnosis not present

## 2016-03-29 DIAGNOSIS — D649 Anemia, unspecified: Secondary | ICD-10-CM | POA: Diagnosis not present

## 2016-03-29 DIAGNOSIS — I2510 Atherosclerotic heart disease of native coronary artery without angina pectoris: Secondary | ICD-10-CM | POA: Diagnosis not present

## 2016-03-29 DIAGNOSIS — I129 Hypertensive chronic kidney disease with stage 1 through stage 4 chronic kidney disease, or unspecified chronic kidney disease: Secondary | ICD-10-CM | POA: Diagnosis not present

## 2016-03-29 DIAGNOSIS — E119 Type 2 diabetes mellitus without complications: Secondary | ICD-10-CM | POA: Diagnosis not present

## 2016-03-29 DIAGNOSIS — N189 Chronic kidney disease, unspecified: Secondary | ICD-10-CM | POA: Diagnosis not present

## 2016-03-29 DIAGNOSIS — E785 Hyperlipidemia, unspecified: Secondary | ICD-10-CM | POA: Diagnosis not present

## 2016-03-29 DIAGNOSIS — R002 Palpitations: Secondary | ICD-10-CM | POA: Diagnosis not present

## 2016-04-11 ENCOUNTER — Other Ambulatory Visit: Payer: Self-pay | Admitting: Internal Medicine

## 2016-04-12 NOTE — Telephone Encounter (Signed)
Refill sent per [facility] refill protocol.

## 2016-06-15 ENCOUNTER — Other Ambulatory Visit: Payer: Self-pay | Admitting: Internal Medicine

## 2016-06-25 ENCOUNTER — Other Ambulatory Visit: Payer: Self-pay | Admitting: Internal Medicine

## 2016-07-05 DIAGNOSIS — I2510 Atherosclerotic heart disease of native coronary artery without angina pectoris: Secondary | ICD-10-CM | POA: Diagnosis not present

## 2016-07-05 DIAGNOSIS — I10 Essential (primary) hypertension: Secondary | ICD-10-CM | POA: Diagnosis not present

## 2016-07-05 DIAGNOSIS — I252 Old myocardial infarction: Secondary | ICD-10-CM | POA: Diagnosis not present

## 2016-07-05 DIAGNOSIS — E119 Type 2 diabetes mellitus without complications: Secondary | ICD-10-CM | POA: Diagnosis not present

## 2016-07-05 DIAGNOSIS — E785 Hyperlipidemia, unspecified: Secondary | ICD-10-CM | POA: Diagnosis not present

## 2016-07-05 DIAGNOSIS — D649 Anemia, unspecified: Secondary | ICD-10-CM | POA: Diagnosis not present

## 2016-07-05 DIAGNOSIS — N189 Chronic kidney disease, unspecified: Secondary | ICD-10-CM | POA: Diagnosis not present

## 2016-07-05 DIAGNOSIS — I129 Hypertensive chronic kidney disease with stage 1 through stage 4 chronic kidney disease, or unspecified chronic kidney disease: Secondary | ICD-10-CM | POA: Diagnosis not present

## 2016-07-11 ENCOUNTER — Other Ambulatory Visit: Payer: Self-pay | Admitting: Internal Medicine

## 2016-07-14 ENCOUNTER — Telehealth: Payer: Self-pay

## 2016-07-14 NOTE — Telephone Encounter (Signed)
Form from [facility] completed for Breast prosthesis. Forms faxed to [phone] successfully. Form sent for scanning.

## 2016-07-20 DIAGNOSIS — N184 Chronic kidney disease, stage 4 (severe): Secondary | ICD-10-CM | POA: Diagnosis not present

## 2016-07-20 DIAGNOSIS — E785 Hyperlipidemia, unspecified: Secondary | ICD-10-CM | POA: Diagnosis not present

## 2016-07-20 DIAGNOSIS — M109 Gout, unspecified: Secondary | ICD-10-CM | POA: Diagnosis not present

## 2016-07-20 DIAGNOSIS — K219 Gastro-esophageal reflux disease without esophagitis: Secondary | ICD-10-CM | POA: Diagnosis not present

## 2016-07-20 DIAGNOSIS — I129 Hypertensive chronic kidney disease with stage 1 through stage 4 chronic kidney disease, or unspecified chronic kidney disease: Secondary | ICD-10-CM | POA: Diagnosis not present

## 2016-07-20 DIAGNOSIS — E875 Hyperkalemia: Secondary | ICD-10-CM | POA: Diagnosis not present

## 2016-07-26 DIAGNOSIS — Z23 Encounter for immunization: Secondary | ICD-10-CM | POA: Diagnosis not present

## 2016-09-08 ENCOUNTER — Other Ambulatory Visit: Payer: Self-pay | Admitting: Internal Medicine

## 2016-09-08 DIAGNOSIS — Z1231 Encounter for screening mammogram for malignant neoplasm of breast: Secondary | ICD-10-CM

## 2016-09-10 ENCOUNTER — Encounter: Payer: Self-pay | Admitting: Internal Medicine

## 2016-09-10 ENCOUNTER — Ambulatory Visit (INDEPENDENT_AMBULATORY_CARE_PROVIDER_SITE_OTHER): Payer: Medicare Other | Admitting: Internal Medicine

## 2016-09-10 VITALS — BP 124/72 | HR 70 | Temp 98.1°F | Resp 14 | Ht 62.0 in | Wt 142.1 lb

## 2016-09-10 DIAGNOSIS — E1122 Type 2 diabetes mellitus with diabetic chronic kidney disease: Secondary | ICD-10-CM | POA: Diagnosis not present

## 2016-09-10 DIAGNOSIS — N182 Chronic kidney disease, stage 2 (mild): Secondary | ICD-10-CM

## 2016-09-10 DIAGNOSIS — H6121 Impacted cerumen, right ear: Secondary | ICD-10-CM

## 2016-09-10 DIAGNOSIS — I10 Essential (primary) hypertension: Secondary | ICD-10-CM | POA: Diagnosis not present

## 2016-09-10 DIAGNOSIS — H6122 Impacted cerumen, left ear: Secondary | ICD-10-CM

## 2016-09-10 LAB — HEMOGLOBIN A1C: Hgb A1c MFr Bld: 6.5 % (ref 4.6–6.5)

## 2016-09-10 LAB — BASIC METABOLIC PANEL
BUN: 50 mg/dL — AB (ref 6–23)
CHLORIDE: 103 meq/L (ref 96–112)
CO2: 30 meq/L (ref 19–32)
CREATININE: 2.18 mg/dL — AB (ref 0.40–1.20)
Calcium: 9.9 mg/dL (ref 8.4–10.5)
GFR: 23.19 mL/min — ABNORMAL LOW (ref >60.00)
Glucose, Bld: 98 mg/dL (ref 70–99)
Potassium: 5.4 mEq/L — ABNORMAL HIGH (ref 3.5–5.1)
Sodium: 141 mEq/L (ref 135–145)

## 2016-09-10 NOTE — Progress Notes (Signed)
Subjective:    Patient ID: Samantha Mckenzie, female    DOB: 08-21-1936, 80 y.o.   MRN: [id number]  DOS:  09/10/2016 Type of visit - description : Acute visit Interval history: For the last few days, she has left ear discomfort described as feeling clogged. She tried to clean the ear with a Q-tip but she was unsuccessful. Has a history of renal failure, nephrology note reviewed  Review of Systems No fever chills Mild runny nose but no sore throat. No sinus pain or congestion  Past Medical History:  Diagnosis Date  . Allergic rhinitis   . Anemia   . Breast CA ([facility])    surgery, chemo, XRT; had peripheral neuropathy (imbalance at times) after chemo  . Cellulitis    Left arm, recurrent   . Chronic renal disease, stage IV (HCC)    Dr. Moshe Cipro  . Depression   . Fatigue   . GERD (gastroesophageal reflux disease)    gastritis, EGD 02/2007  . Gout   . Hyperlipidemia   . Hypertension   . Macular degeneration 2011  . Normal cardiac stress test 12/2014  . Osteoarthritis   . Renal insufficiency     Past Surgical History:  Procedure Laterality Date  . cataracts bilaterally  1 -2012  . LUNG BIOPSY  1999   neg  . MASTECTOMY Left 1999   and lymphnodes     Social History   Social History  . Marital status: Married    Spouse name: N/A  . Number of children: 2  . Years of education: N/A   Occupational History  . retired  Retired   Social History Main Topics  . Smoking status: Never Smoker  . Smokeless tobacco: Never Used  . Alcohol use 0.0 oz/week     Comment: rarely  . Drug use: No  . Sexual activity: Not on file   Other Topics Concern  . Not on file   Social History Narrative   Lives w/ husband , 2 children in [place], [place]                Medication List       Accurate as of 09/10/16 11:59 PM. Always use your most recent med list.          allopurinol 100 MG tablet Commonly known as:  ZYLOPRIM Take 1 tablet (100 mg total) by mouth daily.     amLODipine 5 MG tablet Commonly known as:  NORVASC Take 1 tablet (5 mg total) by mouth daily.   aspirin 81 MG tablet Take 81 mg by mouth daily.   atorvastatin 20 MG tablet Commonly known as:  LIPITOR Take 1 tablet (20 mg total) by mouth daily.   CENTRUM SILVER PO Take 1 tablet by mouth daily.   esomeprazole 40 MG capsule Commonly known as:  NEXIUM Take 40 mg by mouth daily at 12 noon.   gabapentin 100 MG capsule Commonly known as:  NEURONTIN Take 1-2 capsules (100-200 mg total) by mouth at bedtime.   metoprolol tartrate 25 MG tablet Commonly known as:  LOPRESSOR Take 1 tablet (25 mg total) by mouth daily.   valsartan 320 MG tablet Commonly known as:  DIOVAN Take 160 mg by mouth daily.   vitamin B-12 1000 MCG tablet Commonly known as:  CYANOCOBALAMIN Take 1,000 mcg by mouth daily.   Vitamin D3 1000 units Caps Take 1 capsule by mouth daily.          Objective:   Physical Exam  BP 124/72 (BP Location: Right Arm, Patient Position: Sitting, Cuff Size: Small)   Pulse 70   Temp 98.1 F (36.7 C) (Oral)   Resp 14   Ht 5' 2"  (1.575 m)   Wt 142 lb 2 oz (64.5 kg)   SpO2 96%   BMI 26.00 kg/m  General:   Well developed, well nourished . NAD.  HEENT:  Normocephalic . Face symmetric, atraumatic. Right year: Normal Left ear: + Cerumen impaction, canal is otherwise normal. Lungs:  CTA B Normal respiratory effort, no intercostal retractions, no accessory muscle use. Heart: RRR,  no murmur.  No pretibial edema bilaterally  Skin: Not pale. Not jaundice Neurologic:  alert & oriented X3.  Speech normal, gait appropriate for age and unassisted Psych--  Cognition and judgment appear intact.  Cooperative with normal attention span and concentration.  Behavior appropriate. No anxious or depressed appearing.      Assessment & Plan:     Assessment> DM- neuropathy (paresthesias, nl pinprick exam), CKD HTN Hyperlipidemia: Lipitor intolerant? CAD? States see Dr  Terrence Dupont, no OV  records. (-) stress test 12-2014 CKD -- sees nephrology Gout GERD, nl EGD 01-2015  H/o Depression Leg pain-- RLS vs neuropathy, on gabapentin since 2013, good results H/o anemia --- no iron def , Cscope 2012, normal EGD 01-2015 Breast cancer:  --S/p Surgery (L mastectomy), chemotherapy, XRT --Peripheral neuropathy felt to be due to chemotherapy --Recurrent left arm cellulitis DJD   PLAN L Cerumen impaction: Canal is very narrow, we flushed the ear, not successfully. I tried with a spoon and was unable to get much. Minimal bleeding noted. Recommend H2 O2 for a week, call if no better, or if swelling pain or discharge. ENT referral? DM: Diet control, check A1c HTN: Continue Diovan and beta blockers. Last visit with nephrology 07-2016, noted to be stable. Patient quite concerned about the elevated potassium. Request to recheck a BMP. Will do. High cholesterol: Last LDL satisfactory. No change. Had a flu shot RTC 11-2016, CPX

## 2016-09-10 NOTE — Progress Notes (Signed)
Pre visit review using our clinic review tool, if applicable. No additional management support is needed unless otherwise documented below in the visit note. 

## 2016-09-10 NOTE — Patient Instructions (Addendum)
GO TO THE LAB : Get the blood work     GO TO THE FRONT DESK Schedule your next appointment for a  complete physical exam by 11-2016, fasting   Use H2O2 few drops every night on the Left ear If no better, please call

## 2016-09-11 NOTE — Assessment & Plan Note (Signed)
L Cerumen impaction: Canal is very narrow, we flushed the ear, not successfully. I tried with a spoon and was unable to get much. Minimal bleeding noted. Recommend H2 O2 for a week, call if no better, or if swelling pain or discharge. ENT referral? DM: Diet control, check A1c HTN: Continue Diovan and beta blockers. Last visit with nephrology 07-2016, noted to be stable. Patient quite concerned about the elevated potassium. Request to recheck a BMP. Will do. High cholesterol: Last LDL satisfactory. No change. Had a flu shot RTC 11-2016, CPX

## 2016-09-16 ENCOUNTER — Telehealth: Payer: Self-pay | Admitting: Internal Medicine

## 2016-09-16 NOTE — Telephone Encounter (Signed)
Notes Recorded by Colon Branch, MD on 09/13/2016 at 11:17 AM EST Please call with the nurse for Dr. Moshe Cipro at nephrology. Her potassium is 5.4, fax them the results; from my side will just recommend a low K+ diet, I don't know if they like to do something different Then send the patient low potassium diet and a letter Samantha Mckenzie, your kidney function is a stable, your potassium is slightly high, please contact your nephrology for further advice, I do recommend you a low potassium diet. Your blood sugar is excellent  -- Pt notified of instructions and verbalized understanding. She will call the office if she does not receive letter mailed on 09/13/16 by the end of the week.

## 2016-09-16 NOTE — Telephone Encounter (Signed)
Tried calling Pt, unavailable, left message to return call.

## 2016-09-16 NOTE — Telephone Encounter (Signed)
Patient called with questions on her lab results. Please advise   Patient phone: [phone]

## 2016-10-14 DIAGNOSIS — N184 Chronic kidney disease, stage 4 (severe): Secondary | ICD-10-CM | POA: Diagnosis not present

## 2016-10-15 ENCOUNTER — Ambulatory Visit
Admission: RE | Admit: 2016-10-15 | Discharge: 2016-10-15 | Disposition: A | Discharge status: Home / Self Care | Payer: Medicare Other | Source: Ambulatory Visit | Attending: Internal Medicine | Admitting: Internal Medicine

## 2016-10-15 DIAGNOSIS — Z1231 Encounter for screening mammogram for malignant neoplasm of breast: Secondary | ICD-10-CM

## 2016-10-18 DIAGNOSIS — E119 Type 2 diabetes mellitus without complications: Secondary | ICD-10-CM | POA: Diagnosis not present

## 2016-10-18 DIAGNOSIS — N183 Chronic kidney disease, stage 3 (moderate): Secondary | ICD-10-CM | POA: Diagnosis not present

## 2016-10-18 DIAGNOSIS — I129 Hypertensive chronic kidney disease with stage 1 through stage 4 chronic kidney disease, or unspecified chronic kidney disease: Secondary | ICD-10-CM | POA: Diagnosis not present

## 2016-10-18 DIAGNOSIS — E785 Hyperlipidemia, unspecified: Secondary | ICD-10-CM | POA: Diagnosis not present

## 2016-10-18 DIAGNOSIS — D649 Anemia, unspecified: Secondary | ICD-10-CM | POA: Diagnosis not present

## 2016-10-18 DIAGNOSIS — M791 Myalgia: Secondary | ICD-10-CM | POA: Diagnosis not present

## 2016-10-18 DIAGNOSIS — I2510 Atherosclerotic heart disease of native coronary artery without angina pectoris: Secondary | ICD-10-CM | POA: Diagnosis not present

## 2016-11-02 ENCOUNTER — Other Ambulatory Visit: Payer: Self-pay | Admitting: Internal Medicine

## 2016-11-08 ENCOUNTER — Other Ambulatory Visit: Payer: Self-pay

## 2016-11-08 MED ORDER — GABAPENTIN 100 MG PO CAPS: 100.0000 mg | ORAL_CAPSULE | Freq: Every day | ORAL | 1 refills | Status: DC

## 2016-12-06 ENCOUNTER — Ambulatory Visit (INDEPENDENT_AMBULATORY_CARE_PROVIDER_SITE_OTHER): Payer: Medicare Other | Admitting: Medical

## 2016-12-06 ENCOUNTER — Encounter: Payer: Self-pay | Admitting: Medical

## 2016-12-06 VITALS — BP 135/58 | HR 61 | Temp 97.5°F | Resp 16 | Ht 62.0 in | Wt 143.2 lb

## 2016-12-06 DIAGNOSIS — H6122 Impacted cerumen, left ear: Secondary | ICD-10-CM | POA: Diagnosis not present

## 2016-12-06 NOTE — Patient Instructions (Signed)
For your left side cerumen impaction that failed prior lavage, I will make a ENT referral. Since your canal is tiny and your ear bled on last attempt, I do think referral to ent is better option. Would use debrox over the counter starting 4 days before your ent appointment date. This would allow wax to be softened and make for easier lavage.   Follow up with [place] as needed pior to ent referral date.

## 2016-12-06 NOTE — Progress Notes (Signed)
Pre visit review using our clinic review tool, if applicable. No additional management support is needed unless otherwise documented below in the visit note/SLS  

## 2016-12-06 NOTE — Progress Notes (Signed)
Subjective:    Patient ID: Samantha Mckenzie, female    DOB: 1936/06/04, 81 y.o.   MRN: [id number]  HPI  Pt in with left ear pain. Pt had very hard time getting her wax removed last time in December. Pt has narrow canal on prior report. Lavage and manual removal with currete. During procedure desrcibed that blood was seen from trauma.   Review of Systems  Constitutional: Negative for chills, fatigue and fever.  HENT: Negative for congestion, postnasal drip, sneezing and tinnitus.        Ear blocked sensation.  Respiratory: Negative for cough, chest tightness, shortness of breath, wheezing and stridor.   Cardiovascular: Negative for chest pain and palpitations.  Gastrointestinal: Negative for abdominal pain, blood in stool and constipation.  Musculoskeletal: Negative for back pain.  Skin: Negative for rash.  Neurological: Negative for dizziness and headaches.  Hematological: Negative for adenopathy. Does not bruise/bleed easily.    Past Medical History:  Diagnosis Date  . Allergic rhinitis   . Anemia   . Breast CA ([facility])    surgery, chemo, XRT; had peripheral neuropathy (imbalance at times) after chemo  . Cellulitis    Left arm, recurrent   . Chronic renal disease, stage IV (HCC)    Dr. Moshe Cipro  . Depression   . Fatigue   . GERD (gastroesophageal reflux disease)    gastritis, EGD 02/2007  . Gout   . Hyperlipidemia   . Hypertension   . Macular degeneration 2011  . Normal cardiac stress test 12/2014  . Osteoarthritis   . Renal insufficiency      Social History   Social History  . Marital status: Married    Spouse name: N/A  . Number of children: 2  . Years of education: N/A   Occupational History  . retired  Retired   Social History Main Topics  . Smoking status: Never Smoker  . Smokeless tobacco: Never Used  . Alcohol use 0.0 oz/week     Comment: rarely  . Drug use: No  . Sexual activity: Not on file   Other Topics Concern  . Not on file   Social  History Narrative   Lives w/ husband , 2 children in [place], [place]            Past Surgical History:  Procedure Laterality Date  . cataracts bilaterally  1 -2012  . LUNG BIOPSY  1999   neg  . MASTECTOMY Left 1999   and lymphnodes     Family History  Problem Relation Age of Onset  . Breast cancer Other     ? of   . Heart attack Father 69  . Diabetes Sister     2  . Hypertension Sister   . Colon cancer Neg Hx   . Stomach cancer Neg Hx     Allergies  Allergen Reactions  . Codeine Nausea And Vomiting  . Morphine Nausea And Vomiting  . Oxycodone-Acetaminophen Nausea And Vomiting  . Sulfonamide Derivatives Nausea And Vomiting  . Tramadol Hcl Nausea And Vomiting    Current Outpatient Prescriptions on File Prior to Visit  Medication Sig Dispense Refill  . allopurinol (ZYLOPRIM) 100 MG tablet Take 1 tablet (100 mg total) by mouth daily. 90 tablet 2  . amLODipine (NORVASC) 5 MG tablet Take 1 tablet (5 mg total) by mouth daily. 90 tablet 1  . aspirin 81 MG tablet Take 81 mg by mouth daily.      Marland Kitchen atorvastatin (LIPITOR) 20 MG  tablet Take 1 tablet (20 mg total) by mouth daily. 90 tablet 0  . Cholecalciferol (VITAMIN D3) 1000 UNITS CAPS Take 1 capsule by mouth daily.     Marland Kitchen esomeprazole (NEXIUM) 40 MG capsule Take 40 mg by mouth daily at 12 noon.    . gabapentin (NEURONTIN) 100 MG capsule Take 1-2 capsules (100-200 mg total) by mouth at bedtime. 180 capsule 1  . metoprolol tartrate (LOPRESSOR) 25 MG tablet Take 1 tablet (25 mg total) by mouth daily. 90 tablet 2  . Multiple Vitamins-Minerals (CENTRUM SILVER PO) Take 1 tablet by mouth daily.     . valsartan (DIOVAN) 320 MG tablet Take 160 mg by mouth daily. Take one-half tablet daily.    . vitamin B-12 (CYANOCOBALAMIN) 1000 MCG tablet Take 1,000 mcg by mouth daily.       No current facility-administered medications on file prior to visit.     BP (!) 135/58 (BP Location: Right Arm, Patient Position: Sitting, Cuff Size: Normal)   Pulse  61   Temp 97.5 F (36.4 C) (Oral)   Resp 16   Ht 5' 2"  (1.575 m)   Wt 143 lb 4 oz (65 kg)   SpO2 99%   BMI 26.20 kg/m       Objective:   Physical Exam   General  Mental Status - Alert. General Appearance - Well groomed. Not in acute distress.  Skin Rashes- No Rashes.  HEENT Head- Normal. Ear Auditory Canal - Left- tiny canal and blocked with wax Right - Normal.Tympanic Membrane- Left- small edge of tm seen and normal.  Right- Normal. Eye Sclera/Conjunctiva- Left- Normal. Right- Normal. Nose & Sinuses Nasal Mucosa- Left-  Not Boggy and Congested. Right-   Not Boggy and  Congested.Bilateral no maxillary and no  frontal sinus pressure. Mouth & Throat Lips: Upper Lip- Normal: no dryness, cracking, pallor, cyanosis, or vesicular eruption. Lower Lip-Normal: no dryness, cracking, pallor, cyanosis or vesicular eruption. Buccal Mucosa- Bilateral- No Aphthous ulcers. Oropharynx- No Discharge or Erythema. Tonsils: Characteristics- Bilateral- No Erythema or Congestion. Size/Enlargement- Bilateral- No enlargement. Discharge- bilateral-None.  Neck Neck- Supple. No Masses.   Chest and Lung Exam Auscultation: Breath Sounds:-Clear even and unlabored.  Cardiovascular Auscultation:Rythm- Regular, rate and rhythm. Murmurs & Other Heart Sounds:Ausculatation of the heart reveal- No Murmurs.  Lymphatic Head & Neck General Head & Neck Lymphatics: Bilateral: Description- No Localized lymphadenopathy.      Assessment & Plan:  For your left side cerumen impaction that failed prior lavage, I will make a ENT referral. Since your canal is tiny and your ear bled on last attempt, I do think referral to ent is better option. Would use debrox over the counter starting 4 days before your ent appointment date. This would allow wax to be softened and make for easier lavage.   Follow up with [place] as needed pior to ent referral date.  Note seeing last note and reviewing that she bled on attempt  thought best to refer.  Pt given [facility] [place] name to call here if no one from ent calls her.  Karmella Bouvier, Percell Miller, PA-C

## 2016-12-13 ENCOUNTER — Other Ambulatory Visit: Payer: Self-pay | Admitting: Internal Medicine

## 2016-12-21 DIAGNOSIS — H6122 Impacted cerumen, left ear: Secondary | ICD-10-CM | POA: Diagnosis not present

## 2016-12-24 ENCOUNTER — Encounter: Payer: Self-pay | Admitting: Internal Medicine

## 2016-12-24 ENCOUNTER — Other Ambulatory Visit: Payer: Self-pay | Admitting: Internal Medicine

## 2016-12-24 ENCOUNTER — Ambulatory Visit (INDEPENDENT_AMBULATORY_CARE_PROVIDER_SITE_OTHER): Payer: Medicare Other | Admitting: Internal Medicine

## 2016-12-24 VITALS — BP 126/74 | HR 59 | Temp 98.1°F | Resp 14 | Ht 62.0 in | Wt 142.4 lb

## 2016-12-24 DIAGNOSIS — Z23 Encounter for immunization: Secondary | ICD-10-CM | POA: Diagnosis not present

## 2016-12-24 DIAGNOSIS — M109 Gout, unspecified: Secondary | ICD-10-CM | POA: Diagnosis not present

## 2016-12-24 DIAGNOSIS — N189 Chronic kidney disease, unspecified: Secondary | ICD-10-CM | POA: Diagnosis not present

## 2016-12-24 DIAGNOSIS — I10 Essential (primary) hypertension: Secondary | ICD-10-CM | POA: Diagnosis not present

## 2016-12-24 DIAGNOSIS — Z0000 Encounter for general adult medical examination without abnormal findings: Secondary | ICD-10-CM

## 2016-12-24 DIAGNOSIS — E785 Hyperlipidemia, unspecified: Secondary | ICD-10-CM

## 2016-12-24 LAB — CBC WITH DIFFERENTIAL/PLATELET
Basophils Absolute: 0.1 10*3/uL (ref 0.0–0.1)
Basophils Relative: 1 % (ref 0.0–3.0)
EOS ABS: 0.1 10*3/uL (ref 0.0–0.7)
Eosinophils Relative: 2.5 % (ref 0.0–5.0)
HCT: 33.1 % — ABNORMAL LOW (ref 36.0–46.0)
HEMOGLOBIN: 10.9 g/dL — AB (ref 12.0–15.0)
Lymphocytes Relative: 28.3 % (ref 12.0–46.0)
Lymphs Abs: 1.6 10*3/uL (ref 0.7–4.0)
MCHC: 32.8 g/dL (ref 30.0–36.0)
MCV: 96.6 fl (ref 78.0–100.0)
Monocytes Absolute: 0.4 10*3/uL (ref 0.1–1.0)
Monocytes Relative: 7.2 % (ref 3.0–12.0)
Neutro Abs: 3.4 10*3/uL (ref 1.4–7.7)
Neutrophils Relative %: 61 % (ref 43.0–77.0)
Platelets: 211 10*3/uL (ref 150.0–400.0)
RBC: 3.43 Mil/uL — ABNORMAL LOW (ref 3.87–5.11)
RDW: 15.5 % (ref 11.5–15.5)
WBC: 5.6 10*3/uL (ref 4.0–10.5)

## 2016-12-24 LAB — LIPID PANEL
CHOL/HDL RATIO: 4
Cholesterol: 162 mg/dL (ref 0–200)
HDL: 43.5 mg/dL (ref >39.00)
LDL CALC: 81 mg/dL (ref 0–99)
NONHDL: 118.96
Triglycerides: 190 mg/dL — ABNORMAL HIGH (ref 0.0–149.0)
VLDL: 38 mg/dL (ref 0.0–40.0)

## 2016-12-24 LAB — ALT: ALT: 12 U/L (ref 0–35)

## 2016-12-24 LAB — BASIC METABOLIC PANEL
BUN: 48 mg/dL — AB (ref 6–23)
CO2: 27 meq/L (ref 19–32)
CREATININE: 1.99 mg/dL — AB (ref 0.40–1.20)
Calcium: 8.7 mg/dL (ref 8.4–10.5)
Chloride: 102 mEq/L (ref 96–112)
GFR: 25.74 mL/min — ABNORMAL LOW (ref >60.00)
GLUCOSE: 134 mg/dL — AB (ref 70–99)
Potassium: 4.6 mEq/L (ref 3.5–5.1)
Sodium: 136 mEq/L (ref 135–145)

## 2016-12-24 LAB — URIC ACID: URIC ACID, SERUM: 6.4 mg/dL (ref 2.4–7.0)

## 2016-12-24 LAB — AST: AST: 19 U/L (ref 0–37)

## 2016-12-24 NOTE — Assessment & Plan Note (Addendum)
--  Td 2015;  Pneumovax: 2005 and 2018, 2011; prevnar-- 2015; s/p zostavax per pt  --No further PAPs see previous entry  --h/o breast cancer, (-) R MMG 10-2016; declined breast exam today --DEXAs used to be rx by Dr Ubaldo Glassing,  told pt all DEXAs were neg, again declined further dexas --Had a  Colonoscopy 2001 and 2012 (tics), next per GI --Diet exercise discussed   --I also recommend fall prevention, PT eval? Use a cane? Declined for now.

## 2016-12-24 NOTE — Progress Notes (Signed)
Pre visit review using our clinic review tool, if applicable. No additional management support is needed unless otherwise documented below in the visit note. 

## 2016-12-24 NOTE — Patient Instructions (Signed)
GO TO THE LAB : Get the blood work     GO TO THE FRONT DESK Schedule your next appointment for a routine checkup in 4-5 months  Please schedule Medicare wellness with one of our RNs  Continue watching your potassium intake     Fall Prevention in the Home Falls can cause injuries and can affect people from all age groups. There are many simple things that you can do to make your home safe and to help prevent falls. What can I do on the outside of my home?  Regularly repair the edges of walkways and driveways and fix any cracks.  Remove high doorway thresholds.  Trim any shrubbery on the main path into your home.  Use bright outdoor lighting.  Clear walkways of debris and clutter, including tools and rocks.  Regularly check that handrails are securely fastened and in good repair. Both sides of any steps should have handrails.  Install guardrails along the edges of any raised decks or porches.  Have leaves, snow, and ice cleared regularly.  Use sand or salt on walkways during winter months.  In the garage, clean up any spills right away, including grease or oil spills. What can I do in the bathroom?  Use night lights.  Install grab bars by the toilet and in the tub and shower. Do not use towel bars as grab bars.  Use non-skid mats or decals on the floor of the tub or shower.  If you need to sit down while you are in the shower, use a plastic, non-slip stool.  Keep the floor dry. Immediately clean up any water that spills on the floor.  Remove soap buildup in the tub or shower on a regular basis.  Attach bath mats securely with double-sided non-slip rug tape.  Remove throw rugs and other tripping hazards from the floor. What can I do in the bedroom?  Use night lights.  Make sure that a bedside light is easy to reach.  Do not use oversized bedding that drapes onto the floor.  Have a firm chair that has side arms to use for getting dressed.  Remove throw rugs  and other tripping hazards from the floor. What can I do in the kitchen?  Clean up any spills right away.  Avoid walking on wet floors.  Place frequently used items in easy-to-reach places.  If you need to reach for something above you, use a sturdy step stool that has a grab bar.  Keep electrical cables out of the way.  Do not use floor polish or wax that makes floors slippery. If you have to use wax, make sure that it is non-skid floor wax.  Remove throw rugs and other tripping hazards from the floor. What can I do in the stairways?  Do not leave any items on the stairs.  Make sure that there are handrails on both sides of the stairs. Fix handrails that are broken or loose. Make sure that handrails are as long as the stairways.  Check any carpeting to make sure that it is firmly attached to the stairs. Fix any carpet that is loose or worn.  Avoid having throw rugs at the top or bottom of stairways, or secure the rugs with carpet tape to prevent them from moving.  Make sure that you have a light switch at the top of the stairs and the bottom of the stairs. If you do not have them, have them installed. What are some other fall prevention tips?  Wear closed-toe shoes that fit well and support your feet. Wear shoes that have rubber soles or low heels.  When you use a stepladder, make sure that it is completely opened and that the sides are firmly locked. Have someone hold the ladder while you are using it. Do not climb a closed stepladder.  Add color or contrast paint or tape to grab bars and handrails in your home. Place contrasting color strips on the first and last steps.  Use mobility aids as needed, such as canes, walkers, scooters, and crutches.  Turn on lights if it is dark. Replace any light bulbs that burn out.  Set up furniture so that there are clear paths. Keep the furniture in the same spot.  Fix any uneven floor surfaces.  Choose a carpet design that does not  hide the edge of steps of a stairway.  Be aware of any and all pets.  Review your medicines with your healthcare provider. Some medicines can cause dizziness or changes in blood pressure, which increase your risk of falling. Talk with your health care provider about other ways that you can decrease your risk of falls. This may include working with a physical therapist or trainer to improve your strength, balance, and endurance. This information is not intended to replace advice given to you by your health care provider. Make sure you discuss any questions you have with your health care provider. Document Released: 09/17/2002 Document Revised: 02/24/2016 Document Reviewed: 11/01/2014 Elsevier Interactive Patient Education  2017 [employer].

## 2016-12-24 NOTE — Progress Notes (Signed)
Subjective:    Patient ID: Samantha Mckenzie, female    DOB: 18-Nov-1935, 81 y.o.   MRN: [id number]  DOS:  12/24/2016 Type of visit - description : Routine checkup Interval history: In general feeling well except for chronic fatigue, tiredness. DM: Good medication compliance, normal ambulatory BPs Gout: On allopurinol, no recent attacks  Review of [employer] [place] one time few months ago, no major injury, it happened in the context of she helping her disabled husband. Denies chest pain, difficulty breathing. Feels tired butdenies feeling sleepy, no headaches, fever, chills or weight loss.   Past Medical History:  Diagnosis Date  . Allergic rhinitis   . Anemia   . Breast CA ([place])    surgery, chemo, XRT; had peripheral neuropathy (imbalance at times) after chemo  . Cellulitis    Left arm, recurrent   . Chronic renal disease, stage IV (HCC)    Dr. Moshe Cipro  . Depression   . Fatigue   . GERD (gastroesophageal reflux disease)    gastritis, EGD 02/2007  . Gout   . Hyperlipidemia   . Hypertension   . Macular degeneration 2011  . Normal cardiac stress test 12/2014  . Osteoarthritis   . Renal insufficiency     Past Surgical History:  Procedure Laterality Date  . cataracts bilaterally  1 -2012  . LUNG BIOPSY  1999   neg  . MASTECTOMY Left 1999   and lymphnodes     Social History   Social History  . Marital status: Married    Spouse name: N/A  . Number of children: 2  . Years of education: N/A   Occupational History  . retired  Retired   Social History Main Topics  . Smoking status: Never Smoker  . Smokeless tobacco: Never Used  . Alcohol use 0.0 oz/week     Comment: rarely  . Drug use: No  . Sexual activity: Not on file   Other Topics Concern  . Not on file   Social History Narrative   Lives w/ husband , 2 children in [place], 2 GK              Allergies as of 12/24/2016      Reactions   Codeine Nausea And Vomiting   Morphine Nausea And Vomiting   Oxycodone-acetaminophen Nausea And Vomiting   Sulfonamide Derivatives Nausea And Vomiting   Tramadol Hcl Nausea And Vomiting      Medication List       Accurate as of 12/24/16 11:59 PM. Always use your most recent med list.          allopurinol 100 MG tablet Commonly known as:  ZYLOPRIM Take 1 tablet (100 mg total) by mouth daily.   amLODipine 5 MG tablet Commonly known as:  NORVASC Take 1 tablet (5 mg total) by mouth daily.   aspirin 81 MG tablet Take 81 mg by mouth daily.   atorvastatin 20 MG tablet Commonly known as:  LIPITOR Take 1 tablet (20 mg total) by mouth daily.   CENTRUM SILVER PO Take 1 tablet by mouth daily.   esomeprazole 40 MG capsule Commonly known as:  NEXIUM Take 40 mg by mouth daily at 12 noon.   gabapentin 100 MG capsule Commonly known as:  NEURONTIN Take 1-2 capsules (100-200 mg total) by mouth at bedtime.   metoprolol tartrate 25 MG tablet Commonly known as:  LOPRESSOR Take 1 tablet (25 mg total) by mouth daily.   valsartan 320 MG tablet Commonly known as:  DIOVAN Take 160 mg by mouth daily. Take one-half tablet daily.   vitamin B-12 1000 MCG tablet Commonly known as:  CYANOCOBALAMIN Take 1,000 mcg by mouth daily.   Vitamin D3 1000 units Caps Take 1 capsule by mouth daily.          Objective:   Physical Exam BP 126/74 (BP Location: Left Arm, Patient Position: Sitting, Cuff Size: Normal)   Pulse (!) 59   Temp 98.1 F (36.7 C) (Oral)   Resp 14   Ht 5' 2"  (1.575 m)   Wt 142 lb 6 oz (64.6 kg)   SpO2 95%   BMI 26.04 kg/m  General:   Well developed, well nourished . NAD.  HEENT:  Normocephalic . Face symmetric, atraumatic Neck: No thyromegaly Lungs:  CTA B Normal respiratory effort, no intercostal retractions, no accessory muscle use. Heart: RRR,  no murmur.  no pretibial edema bilaterally  Abdomen:  Not distended, soft, non-tender. No rebound or rigidity.  DIABETIC FEET EXAM: No lower extremity edema Normal pedal  pulses bilaterally Skin normal, nails normal, no calluses Pinprick examination of the feet normal. Neurologic:  alert & oriented X3.  Speech normal, gait appropriate for age and unassisted Psych--  Cognition and judgment appear intact.  Cooperative with normal attention span and concentration.  Behavior appropriate. No anxious or depressed appearing.    Assessment & Plan:   Assessment> DM- neuropathy (paresthesias, nl pinprick exam), CKD HTN Hyperlipidemia: Lipitor intolerant? CAD? States see Dr Terrence Dupont, no OV  records. (-) stress test 12-2014 CKD -- sees nephrology Gout GERD, nl EGD 01-2015  H/o Depression Leg pain-- RLS vs neuropathy, on gabapentin since 2013, good results H/o anemia --- no iron def , Cscope 2012, normal EGD 01-2015 Breast cancer:  --S/p Surgery (L mastectomy), chemotherapy, XRT --Peripheral neuropathy felt to be due to chemotherapy --Recurrent left arm cellulitis DJD  PLAN DM: Diet control, last A1c satisfactory HTN: Continue amlodipine, Diovan, checking a BMP, has a h/o elevated potassium. Encouraged low potassium diet. Hyperlipidemia: On Lipitor, check a FLP, AST, ALT CKD: Check a CBC.  Gout: Checking a uric acid, on allopurinol Fatigue: Chronic issue, at baseline. Primary care issues reviewed   RTC 4-5 months

## 2016-12-26 NOTE — Assessment & Plan Note (Signed)
DM: Diet control, last A1c satisfactory HTN: Continue amlodipine, Diovan, checking a BMP, has a h/o elevated potassium. Encouraged low potassium diet. Hyperlipidemia: On Lipitor, check a FLP, AST, ALT CKD: Check a CBC.  Gout: Checking a uric acid, on allopurinol Fatigue: Chronic issue, at baseline. Primary care issues reviewed   RTC 4-5 months

## 2017-04-18 DIAGNOSIS — D649 Anemia, unspecified: Secondary | ICD-10-CM | POA: Diagnosis not present

## 2017-04-18 DIAGNOSIS — E119 Type 2 diabetes mellitus without complications: Secondary | ICD-10-CM | POA: Diagnosis not present

## 2017-04-18 DIAGNOSIS — I2510 Atherosclerotic heart disease of native coronary artery without angina pectoris: Secondary | ICD-10-CM | POA: Diagnosis not present

## 2017-04-18 DIAGNOSIS — I129 Hypertensive chronic kidney disease with stage 1 through stage 4 chronic kidney disease, or unspecified chronic kidney disease: Secondary | ICD-10-CM | POA: Diagnosis not present

## 2017-04-18 DIAGNOSIS — E785 Hyperlipidemia, unspecified: Secondary | ICD-10-CM | POA: Diagnosis not present

## 2017-04-18 DIAGNOSIS — N189 Chronic kidney disease, unspecified: Secondary | ICD-10-CM | POA: Diagnosis not present

## 2017-04-21 DIAGNOSIS — N183 Chronic kidney disease, stage 3 (moderate): Secondary | ICD-10-CM | POA: Diagnosis not present

## 2017-04-21 DIAGNOSIS — I10 Essential (primary) hypertension: Secondary | ICD-10-CM | POA: Diagnosis not present

## 2017-04-21 DIAGNOSIS — E785 Hyperlipidemia, unspecified: Secondary | ICD-10-CM | POA: Diagnosis not present

## 2017-05-26 ENCOUNTER — Ambulatory Visit: Payer: Medicare Other | Admitting: Internal Medicine

## 2017-05-31 ENCOUNTER — Telehealth: Payer: Self-pay | Admitting: Internal Medicine

## 2017-05-31 NOTE — Telephone Encounter (Signed)
FYI

## 2017-05-31 NOTE — Telephone Encounter (Signed)
Caller name: Estella Malatesta Relationship to patient: self Can be reached: [phone]  Reason for call: Pt called in stating dizzy each morning when she gets up. This has been ongoing for about 2 months. Transferred to [facility] with [facility].

## 2017-05-31 NOTE — Telephone Encounter (Signed)
[facility] - Client TELEPHONE ADVICE RECORD [facility]  Patient Name: Samantha Mckenzie  DOB: [birth year]    Initial Comment Caller states she has been waking up with dizziness   Nurse Assessment  Nurse: Wynetta Emery, RN, Baker Janus Date/Time (Eastern Time): 05/31/2017 9:52:13 AM  Confirm and document reason for call. If symptomatic, describe symptoms. ---Brand Males has been waking up during the night to go to [place] had dizziness one episode in June and it happened again last week; when getting back up she is fine. concerned since this has now happened x 2  Does the patient have any new or worsening symptoms? ---Yes  Will a triage be completed? ---Yes  Related visit to physician within the last 2 weeks? ---No  Does the PT have any chronic conditions? (i.e. diabetes, asthma, etc.) ---Unknown  Is this a behavioral health or substance abuse call? ---No     Guidelines    Guideline Title Affirmed Question Affirmed Notes       Final Disposition User        Comments  NOTE: 06/01/2017 11am w/Dr. French Ana c/o vertigo

## 2017-06-01 ENCOUNTER — Ambulatory Visit (INDEPENDENT_AMBULATORY_CARE_PROVIDER_SITE_OTHER): Payer: Medicare Other | Admitting: Internal Medicine

## 2017-06-01 ENCOUNTER — Encounter: Payer: Self-pay | Admitting: Internal Medicine

## 2017-06-01 VITALS — BP 132/60 | HR 58 | Temp 97.4°F | Resp 14 | Ht 62.0 in | Wt 141.0 lb

## 2017-06-01 DIAGNOSIS — R42 Dizziness and giddiness: Secondary | ICD-10-CM

## 2017-06-01 DIAGNOSIS — E1122 Type 2 diabetes mellitus with diabetic chronic kidney disease: Secondary | ICD-10-CM

## 2017-06-01 DIAGNOSIS — N182 Chronic kidney disease, stage 2 (mild): Secondary | ICD-10-CM | POA: Diagnosis not present

## 2017-06-01 DIAGNOSIS — E875 Hyperkalemia: Secondary | ICD-10-CM

## 2017-06-01 DIAGNOSIS — I10 Essential (primary) hypertension: Secondary | ICD-10-CM

## 2017-06-01 LAB — TSH: TSH: 2.52 u[IU]/mL (ref 0.35–4.50)

## 2017-06-01 LAB — BASIC METABOLIC PANEL
BUN: 41 mg/dL — AB (ref 6–23)
CO2: 29 meq/L (ref 19–32)
Calcium: 9.3 mg/dL (ref 8.4–10.5)
Chloride: 101 mEq/L (ref 96–112)
Creatinine, Ser: 2.08 mg/dL — ABNORMAL HIGH (ref 0.40–1.20)
GFR: 24.43 mL/min — ABNORMAL LOW (ref >60.00)
Glucose, Bld: 118 mg/dL — ABNORMAL HIGH (ref 70–99)
Potassium: 5.4 mEq/L — ABNORMAL HIGH (ref 3.5–5.1)
Sodium: 138 mEq/L (ref 135–145)

## 2017-06-01 LAB — CBC WITH DIFFERENTIAL/PLATELET
BASOS PCT: 0.7 % (ref 0.0–3.0)
Basophils Absolute: 0 10*3/uL (ref 0.0–0.1)
EOS ABS: 0.1 10*3/uL (ref 0.0–0.7)
Eosinophils Relative: 1.6 % (ref 0.0–5.0)
HEMATOCRIT: 33.5 % — AB (ref 36.0–46.0)
Hemoglobin: 10.7 g/dL — ABNORMAL LOW (ref 12.0–15.0)
Lymphocytes Relative: 30.5 % (ref 12.0–46.0)
Lymphs Abs: 1.6 10*3/uL (ref 0.7–4.0)
MCHC: 31.9 g/dL (ref 30.0–36.0)
MCV: 97.6 fl (ref 78.0–100.0)
MONO ABS: 0.5 10*3/uL (ref 0.1–1.0)
Monocytes Relative: 9.2 % (ref 3.0–12.0)
NEUTROS ABS: 3.1 10*3/uL (ref 1.4–7.7)
Neutrophils Relative %: 58 % (ref 43.0–77.0)
PLATELETS: 235 10*3/uL (ref 150.0–400.0)
RBC: 3.43 Mil/uL — ABNORMAL LOW (ref 3.87–5.11)
RDW: 15.8 % — AB (ref 11.5–15.5)
WBC: 5.3 10*3/uL (ref 4.0–10.5)

## 2017-06-01 LAB — HEMOGLOBIN A1C: HEMOGLOBIN A1C: 6.7 % — AB (ref 4.6–6.5)

## 2017-06-01 NOTE — Assessment & Plan Note (Signed)
Dizziness: Episodes x 2 as described above, likely a peripheral issue however given cardiovascular risk factors, will proceed with a workup. EKG machine  is not working, we will ask her to come back as soon as the machine is fixed. Get a MRI, echo and carotid ultrasound. DM: Diet control, check A1c HTN: Continue amlodipine, Diovan (to check w/ pharmacy if her batch is ok), check a BMP, CBC, TSH ER if symptoms persistent.. RTC 6 weeks.

## 2017-06-01 NOTE — Patient Instructions (Signed)
GO TO THE LAB : Get the blood work     GO TO THE FRONT DESK Schedule your next appointment for a  checkup in 6 weeks  We'll schedule an MRI, echocardiogram and carotid ultrasound   If you have more dizziness particularly if you have persistent symptoms: Go to the ER  We'll call you as soon as our EKG machine is fixed

## 2017-06-01 NOTE — Progress Notes (Signed)
Pre visit review using our clinic review tool, if applicable. No additional management support is needed unless otherwise documented below in the visit note. 

## 2017-06-01 NOTE — Progress Notes (Signed)
Subjective:    Patient ID: Samantha Mckenzie, female    DOB: January 07, 1936, 81 y.o.   MRN: [id number]  DOS:  06/01/2017 Type of visit - description : acute Interval history: Her main concern is dizziness. 2 months ago, she was sleeping, needed to go to the bathroom, got up and immediately felt dizzy.  Described the symptoms as she had to "walk on a moving train", had to hold onto things to prevent a fall. After she went to the bathroom, she went back to bed and symptoms stopped . The next morning she felt well. There was no associated chest pain, difficulty breathing, palpitations. No stroke symptoms such as slurred speech, diplopia, facial numbness. Had an identical episodes last week.  Since she is here, we also discussed her chronic medical problems. Good compliance of medication, ambulatory BPs normal  Review of Systems See above   Past Medical History:  Diagnosis Date  . Allergic rhinitis   . Anemia   . Breast CA ([place])    surgery, chemo, XRT; had peripheral neuropathy (imbalance at times) after chemo  . Cellulitis    Left arm, recurrent   . Chronic renal disease, stage IV (HCC)    Dr. Moshe Cipro  . Depression   . Fatigue   . GERD (gastroesophageal reflux disease)    gastritis, EGD 02/2007  . Gout   . Hyperlipidemia   . Hypertension   . Macular degeneration 2011  . Normal cardiac stress test 12/2014  . Osteoarthritis   . Renal insufficiency     Past Surgical History:  Procedure Laterality Date  . cataracts bilaterally  1 -2012  . LUNG BIOPSY  1999   neg  . MASTECTOMY Left 1999   and lymphnodes     Social History   Social History  . Marital status: Married    Spouse name: N/A  . Number of children: 2  . Years of education: N/A   Occupational History  . retired  Retired   Social History Main Topics  . Smoking status: Never Smoker  . Smokeless tobacco: Never Used  . Alcohol use 0.0 oz/week     Comment: rarely  . Drug use: No  . Sexual activity: Not on  file   Other Topics Concern  . Not on file   Social History Narrative   Lives w/ husband , 2 children in [place], 2 [place]              Allergies as of 06/01/2017      Reactions   Codeine Nausea And Vomiting   Morphine Nausea And Vomiting   Oxycodone-acetaminophen Nausea And Vomiting   Sulfonamide Derivatives Nausea And Vomiting   Tramadol Hcl Nausea And Vomiting      Medication List       Accurate as of 06/01/17  5:27 PM. Always use your most recent med list.          allopurinol 100 MG tablet Commonly known as:  ZYLOPRIM Take 1 tablet (100 mg total) by mouth daily.   amLODipine 5 MG tablet Commonly known as:  NORVASC Take 1 tablet (5 mg total) by mouth daily.   aspirin 81 MG tablet Take 81 mg by mouth daily.   atorvastatin 20 MG tablet Commonly known as:  LIPITOR Take 1 tablet (20 mg total) by mouth daily.   CENTRUM SILVER PO Take 1 tablet by mouth daily.   esomeprazole 40 MG capsule Commonly known as:  NEXIUM Take 40 mg by mouth daily at  12 noon.   gabapentin 100 MG capsule Commonly known as:  NEURONTIN Take 1-2 capsules (100-200 mg total) by mouth at bedtime.   metoprolol tartrate 25 MG tablet Commonly known as:  LOPRESSOR Take 1 tablet (25 mg total) by mouth daily.   valsartan 320 MG tablet Commonly known as:  DIOVAN Take 160 mg by mouth daily. Take one-half tablet daily.   vitamin B-12 1000 MCG tablet Commonly known as:  CYANOCOBALAMIN Take 1,000 mcg by mouth daily.   Vitamin D3 1000 units Caps Take 1 capsule by mouth daily.            Discharge Care Instructions        Start     Ordered   06/01/17 8937  Basic metabolic panel     34/28/76 1137   06/01/17 0000  CBC w/Diff     06/01/17 1137   06/01/17 0000  Hemoglobin A1c     06/01/17 1137   06/01/17 0000  TSH     06/01/17 1137   06/01/17 0000  MR Brain Wo Contrast    Question Answer Comment  What is the patient's sedation requirement? No Sedation   Does the patient have a pacemaker  or implanted devices? No   Preferred imaging location? [facility] (table limit 350lbs)   Radiology Contrast Protocol - do NOT remove file path [email or web address]      06/01/17 1142   06/01/17 0000  ECHOCARDIOGRAM COMPLETE    Question Answer Comment  Where should this test be performed [facility]   Perflutren DEFINITY (image enhancing agent) should be administered unless hypersensitivity or allergy exist Administer Perflutren   Expected Date: ASAP      06/01/17 1142   06/01/17 0000  US Carotid Duplex Bilateral    Question Answer Comment  Reason for exam: dizziness   Preferred imaging location? [facility]      06/01/17 1142         Objective:   Physical Exam BP 132/60 (BP Location: Right Arm, Patient Position: Sitting, Cuff Size: Small)   Pulse (!) 58   Temp (!) 97.4 F (36.3 C) (Oral)   Resp 14   Ht 5' 2"  (1.575 m)   Wt 141 lb (64 kg)   SpO2 96%   BMI 25.79 kg/m  General:   Well developed, well nourished . NAD.  HEENT:  Normocephalic . Face symmetric, atraumatic Neck: Normal carotid pulses, no bruit Lungs:  CTA B Normal respiratory effort, no intercostal retractions, no accessory muscle use. Heart: RRR,  no murmur.  No pretibial edema bilaterally  Skin: Not pale. Not jaundice Neurologic:  alert & oriented X3.  Speech normal, gait appropriate for age and unassisted EOMI. Pupils nonreactive due to previous surgeries. Motor and DTRs symmetric Psych--  Cognition and judgment appear intact.  Cooperative with normal attention span and concentration.  Behavior appropriate. No anxious or depressed appearing.      Assessment & Plan:   Assessment  DM- neuropathy (paresthesias, nl pinprick exam), CKD HTN Hyperlipidemia: Lipitor intolerant? CAD? States see Dr Terrence Dupont, no OV  records. (-) stress test 12-2014 CKD -- sees nephrology Gout GERD, nl EGD 01-2015  H/o Depression Leg pain-- RLS vs neuropathy, on gabapentin since  2013, good results H/o anemia --- no iron def , Cscope 2012, normal EGD 01-2015 Breast cancer:  --S/p Surgery (L mastectomy), chemotherapy, XRT --Peripheral neuropathy felt to be due to chemotherapy --Recurrent left arm cellulitis DJD  PLAN Dizziness: Episodes x 2 as described above, likely  a peripheral issue however given cardiovascular risk factors, will proceed with a workup. EKG machine  is not working, we will ask her to come back as soon as the machine is fixed. Get a MRI, echo and carotid ultrasound. DM: Diet control, check A1c HTN: Continue amlodipine, Diovan (to check w/ pharmacy if her batch is ok), check a BMP, CBC, TSH ER if symptoms persistent.. RTC 6 weeks.

## 2017-06-02 ENCOUNTER — Ambulatory Visit (HOSPITAL_BASED_OUTPATIENT_CLINIC_OR_DEPARTMENT_OTHER): Admission: RE | Admit: 2017-06-02 | Payer: Medicare Other | Source: Ambulatory Visit

## 2017-06-02 ENCOUNTER — Ambulatory Visit (INDEPENDENT_AMBULATORY_CARE_PROVIDER_SITE_OTHER): Payer: Medicare Other | Admitting: Family Medicine

## 2017-06-02 DIAGNOSIS — R42 Dizziness and giddiness: Secondary | ICD-10-CM

## 2017-06-02 NOTE — Progress Notes (Signed)
Pre visit review using our clinic review tool, if applicable. No additional management support is needed unless otherwise documented below in the visit note.  Patient presents in office for EKG per OV note 06/01/17. RN completed EKG & printed the results. Patient tolerated the procedure well. Dr. Nani Ravens reviewed EKG in PCP's absence.

## 2017-06-02 NOTE — Progress Notes (Signed)
Noted. LVH by voltage criteria in AVL. Reg PCP has echo ordered. No changes in care at this time.

## 2017-06-03 ENCOUNTER — Ambulatory Visit (HOSPITAL_BASED_OUTPATIENT_CLINIC_OR_DEPARTMENT_OTHER)
Admission: RE | Admit: 2017-06-03 | Discharge: 2017-06-03 | Disposition: A | Discharge status: Home / Self Care | Payer: Medicare Other | Source: Ambulatory Visit | Attending: Internal Medicine | Admitting: Internal Medicine

## 2017-06-03 DIAGNOSIS — I6523 Occlusion and stenosis of bilateral carotid arteries: Secondary | ICD-10-CM | POA: Insufficient documentation

## 2017-06-03 DIAGNOSIS — R42 Dizziness and giddiness: Secondary | ICD-10-CM | POA: Insufficient documentation

## 2017-06-03 NOTE — Addendum Note (Signed)
Addended byDamita Dunnings D on: 06/03/2017 01:57 PM   Modules accepted: Orders

## 2017-06-07 ENCOUNTER — Other Ambulatory Visit: Payer: Self-pay

## 2017-06-07 ENCOUNTER — Ambulatory Visit (HOSPITAL_COMMUNITY): Payer: Medicare Other | Attending: Cardiology

## 2017-06-07 DIAGNOSIS — R42 Dizziness and giddiness: Secondary | ICD-10-CM

## 2017-06-07 DIAGNOSIS — I5030 Unspecified diastolic (congestive) heart failure: Secondary | ICD-10-CM | POA: Insufficient documentation

## 2017-06-07 DIAGNOSIS — I10 Essential (primary) hypertension: Secondary | ICD-10-CM | POA: Diagnosis not present

## 2017-06-11 ENCOUNTER — Ambulatory Visit (HOSPITAL_BASED_OUTPATIENT_CLINIC_OR_DEPARTMENT_OTHER)
Admission: RE | Admit: 2017-06-11 | Discharge: 2017-06-11 | Disposition: A | Discharge status: Home / Self Care | Payer: Medicare Other | Source: Ambulatory Visit | Attending: Internal Medicine | Admitting: Internal Medicine

## 2017-06-11 DIAGNOSIS — R410 Disorientation, unspecified: Secondary | ICD-10-CM | POA: Insufficient documentation

## 2017-06-11 DIAGNOSIS — R42 Dizziness and giddiness: Secondary | ICD-10-CM | POA: Diagnosis not present

## 2017-06-14 ENCOUNTER — Other Ambulatory Visit: Payer: Self-pay | Admitting: Internal Medicine

## 2017-06-18 ENCOUNTER — Other Ambulatory Visit: Payer: Self-pay | Admitting: Internal Medicine

## 2017-07-13 ENCOUNTER — Encounter: Payer: Self-pay | Admitting: Internal Medicine

## 2017-07-13 ENCOUNTER — Ambulatory Visit (INDEPENDENT_AMBULATORY_CARE_PROVIDER_SITE_OTHER): Payer: Medicare Other | Admitting: Internal Medicine

## 2017-07-13 VITALS — BP 116/56 | HR 57 | Temp 97.7°F | Resp 14 | Ht 62.0 in | Wt 141.1 lb

## 2017-07-13 DIAGNOSIS — N182 Chronic kidney disease, stage 2 (mild): Secondary | ICD-10-CM

## 2017-07-13 DIAGNOSIS — Z23 Encounter for immunization: Secondary | ICD-10-CM | POA: Diagnosis not present

## 2017-07-13 DIAGNOSIS — E1122 Type 2 diabetes mellitus with diabetic chronic kidney disease: Secondary | ICD-10-CM | POA: Diagnosis not present

## 2017-07-13 DIAGNOSIS — R42 Dizziness and giddiness: Secondary | ICD-10-CM

## 2017-07-13 DIAGNOSIS — N19 Unspecified kidney failure: Secondary | ICD-10-CM

## 2017-07-13 LAB — BASIC METABOLIC PANEL
BUN: 54 mg/dL — AB (ref 6–23)
CHLORIDE: 99 meq/L (ref 96–112)
CO2: 28 meq/L (ref 19–32)
Calcium: 9.7 mg/dL (ref 8.4–10.5)
Creatinine, Ser: 2.24 mg/dL — ABNORMAL HIGH (ref 0.40–1.20)
GFR: 22.42 mL/min — ABNORMAL LOW (ref >60.00)
GLUCOSE: 111 mg/dL — AB (ref 70–99)
Potassium: 5.1 mEq/L (ref 3.5–5.1)
Sodium: 133 mEq/L — ABNORMAL LOW (ref 135–145)

## 2017-07-13 NOTE — Progress Notes (Signed)
Pre visit review using our clinic review tool, if applicable. No additional management support is needed unless otherwise documented below in the visit note. 

## 2017-07-13 NOTE — Progress Notes (Signed)
Subjective:    Patient ID: Samantha Mckenzie, female    DOB: Feb 16, 1936, 81 y.o.   MRN: [id number]  DOS:  07/13/2017 Type of visit - description : f/u Interval history: Was seen several weeks ago with dizziness, symptoms resolved. Workup reviewed and discussed with the patient Hyperkalemia: She does follow a low potassium diet.   Review of Systems Denies chest pain, difficulty breathing or palpitations No headaches No lower extremity paresthesias  Past Medical History:  Diagnosis Date  . Allergic rhinitis   . Anemia   . Breast CA ([place])    surgery, chemo, XRT; had peripheral neuropathy (imbalance at times) after chemo  . Cellulitis    Left arm, recurrent   . Chronic renal disease, stage IV (HCC)    Dr. Moshe Cipro  . Depression   . Fatigue   . GERD (gastroesophageal reflux disease)    gastritis, EGD 02/2007  . Gout   . Hyperlipidemia   . Hypertension   . Macular degeneration 2011  . Normal cardiac stress test 12/2014  . Osteoarthritis   . Renal insufficiency     Past Surgical History:  Procedure Laterality Date  . cataracts bilaterally  1 -2012  . LUNG BIOPSY  1999   neg  . MASTECTOMY Left 1999   and lymphnodes     Social History   Social History  . Marital status: Married    Spouse name: N/A  . Number of children: 2  . Years of education: N/A   Occupational History  . retired  Retired   Social History Main Topics  . Smoking status: Never Smoker  . Smokeless tobacco: Never Used  . Alcohol use 0.0 oz/week     Comment: rarely  . Drug use: No  . Sexual activity: Not on file   Other Topics Concern  . Not on file   Social History Narrative   Lives w/ husband , 2 children in [place], 2 [place] as of 07/13/2017      Reactions   Codeine Nausea And Vomiting   Morphine Nausea And Vomiting   Oxycodone-acetaminophen Nausea And Vomiting   Sulfonamide Derivatives Nausea And Vomiting   Tramadol Hcl Nausea And Vomiting      Medication  List       Accurate as of 07/13/17 11:59 PM. Always use your most recent med list.          allopurinol 100 MG tablet Commonly known as:  ZYLOPRIM Take 1 tablet (100 mg total) by mouth daily.   amLODipine 5 MG tablet Commonly known as:  NORVASC Take 1 tablet (5 mg total) by mouth daily.   aspirin 81 MG tablet Take 81 mg by mouth daily.   atorvastatin 20 MG tablet Commonly known as:  LIPITOR Take 1 tablet (20 mg total) by mouth daily.   CENTRUM SILVER PO Take 1 tablet by mouth daily.   esomeprazole 40 MG capsule Commonly known as:  NEXIUM Take 40 mg by mouth daily at 12 noon.   gabapentin 100 MG capsule Commonly known as:  NEURONTIN Take 1-2 capsules (100-200 mg total) by mouth at bedtime.   metoprolol tartrate 25 MG tablet Commonly known as:  LOPRESSOR Take 1 tablet (25 mg total) by mouth daily.   valsartan 320 MG tablet Commonly known as:  DIOVAN Take 160 mg by mouth daily. Take one-half tablet daily.   vitamin B-12 1000 MCG tablet Commonly known as:  CYANOCOBALAMIN Take 1,000 mcg by mouth daily.   Vitamin D3 1000 units Caps Take 1 capsule by mouth daily.          Objective:   Physical Exam BP (!) 116/56 (BP Location: Right Arm, Patient Position: Sitting, Cuff Size: Small)   Pulse (!) 57   Temp 97.7 F (36.5 C) (Oral)   Resp 14   Ht 5' 2"  (1.575 m)   Wt 141 lb 2 oz (64 kg)   SpO2 93%   BMI 25.81 kg/m  General:   Well developed, well nourished . NAD.  HEENT:  Normocephalic . Face symmetric, atraumatic Lungs:  CTA B Normal respiratory effort, no intercostal retractions, no accessory muscle use. Heart: RRR,  no murmur.  No pretibial edema bilaterally  DIABETIC FEET EXAM: No lower extremity edema Normal pedal pulses bilaterally Skin normal, nails normal, no calluses Pinprick examination of the feet normal. Neurologic:  alert & oriented X3.  Speech normal, gait appropriate for age and unassisted Psych--  Cognition and judgment appear  intact.  Cooperative with normal attention span and concentration.  Behavior appropriate. No anxious or depressed appearing.      Assessment & Plan:   Assessment  DM- neuropathy (paresthesias, nl pinprick exam), CKD HTN Hyperlipidemia: Lipitor intolerant? CAD? States see Dr Terrence Dupont, no OV  records. (-) stress test 12-2014 CKD -- sees nephrology Gout GERD, nl EGD 01-2015  H/o Depression Leg pain-- RLS vs neuropathy, on gabapentin since 2013, good results H/o anemia --- no iron def , Cscope 2012, normal EGD 01-2015 Breast cancer:  --S/p Surgery (L mastectomy), chemotherapy, XRT --Peripheral neuropathy felt to be due to chemotherapy --Recurrent left arm cellulitis DJD  PLAN DM: Last A1c 6.7, on diet control,Today she denies paresthesias and feet exam is normal. CKD: Last potassium elevated, she does follow a low potassium diet, recheck a BMP. States she has a follow-up with nephrology this week. Will consider decrease or stop Diovan. Dizziness: Since the last visit, symptoms resolved, and normal brain MRI, echocardiogram: "Abnormal global longitudinal strain, -12.4%.", I informally discuss results w/  Cards, told echo was essentially okay. No further eval needed Flu shot today RTC 4 months

## 2017-07-13 NOTE — Patient Instructions (Signed)
GO TO THE LAB : Get the blood work     GO TO THE FRONT DESK Schedule your next appointment for a   routine checkup in 4 months

## 2017-07-14 DIAGNOSIS — N184 Chronic kidney disease, stage 4 (severe): Secondary | ICD-10-CM | POA: Diagnosis not present

## 2017-07-14 DIAGNOSIS — M109 Gout, unspecified: Secondary | ICD-10-CM | POA: Diagnosis not present

## 2017-07-14 DIAGNOSIS — Z6826 Body mass index (BMI) 26.0-26.9, adult: Secondary | ICD-10-CM | POA: Diagnosis not present

## 2017-07-14 DIAGNOSIS — K219 Gastro-esophageal reflux disease without esophagitis: Secondary | ICD-10-CM | POA: Diagnosis not present

## 2017-07-14 DIAGNOSIS — I129 Hypertensive chronic kidney disease with stage 1 through stage 4 chronic kidney disease, or unspecified chronic kidney disease: Secondary | ICD-10-CM | POA: Diagnosis not present

## 2017-07-14 DIAGNOSIS — E875 Hyperkalemia: Secondary | ICD-10-CM | POA: Diagnosis not present

## 2017-07-14 DIAGNOSIS — E785 Hyperlipidemia, unspecified: Secondary | ICD-10-CM | POA: Diagnosis not present

## 2017-07-14 NOTE — Assessment & Plan Note (Signed)
DM: Last A1c 6.7, on diet control,Today she denies paresthesias and feet exam is normal. CKD: Last potassium elevated, she does follow a low potassium diet, recheck a BMP. States she has a follow-up with nephrology this week. Will consider decrease or stop Diovan. Dizziness: Since the last visit, symptoms resolved, and normal brain MRI, echocardiogram: "Abnormal global longitudinal strain, -12.4%.", I informally discuss results w/  Cards, told echo was essentially okay. No further eval needed Flu shot today RTC 4 months

## 2017-07-18 DIAGNOSIS — E785 Hyperlipidemia, unspecified: Secondary | ICD-10-CM | POA: Diagnosis not present

## 2017-07-18 DIAGNOSIS — N189 Chronic kidney disease, unspecified: Secondary | ICD-10-CM | POA: Diagnosis not present

## 2017-07-18 DIAGNOSIS — I129 Hypertensive chronic kidney disease with stage 1 through stage 4 chronic kidney disease, or unspecified chronic kidney disease: Secondary | ICD-10-CM | POA: Diagnosis not present

## 2017-07-18 DIAGNOSIS — R42 Dizziness and giddiness: Secondary | ICD-10-CM | POA: Diagnosis not present

## 2017-07-18 DIAGNOSIS — I2510 Atherosclerotic heart disease of native coronary artery without angina pectoris: Secondary | ICD-10-CM | POA: Diagnosis not present

## 2017-07-18 DIAGNOSIS — E1122 Type 2 diabetes mellitus with diabetic chronic kidney disease: Secondary | ICD-10-CM | POA: Diagnosis not present

## 2017-07-30 ENCOUNTER — Other Ambulatory Visit: Payer: Self-pay | Admitting: Internal Medicine

## 2017-09-13 ENCOUNTER — Other Ambulatory Visit: Payer: Self-pay | Admitting: Internal Medicine

## 2017-09-13 DIAGNOSIS — Z1231 Encounter for screening mammogram for malignant neoplasm of breast: Secondary | ICD-10-CM

## 2017-09-23 ENCOUNTER — Other Ambulatory Visit: Payer: Self-pay | Admitting: Internal Medicine

## 2017-09-27 DIAGNOSIS — H9312 Tinnitus, left ear: Secondary | ICD-10-CM | POA: Diagnosis not present

## 2017-09-27 DIAGNOSIS — H6122 Impacted cerumen, left ear: Secondary | ICD-10-CM | POA: Diagnosis not present

## 2017-10-18 ENCOUNTER — Ambulatory Visit
Admission: RE | Admit: 2017-10-18 | Discharge: 2017-10-18 | Disposition: A | Discharge status: Home / Self Care | Payer: Medicare Other | Source: Ambulatory Visit | Attending: Internal Medicine | Admitting: Internal Medicine

## 2017-10-18 DIAGNOSIS — Z1231 Encounter for screening mammogram for malignant neoplasm of breast: Secondary | ICD-10-CM | POA: Diagnosis not present

## 2017-10-31 DIAGNOSIS — I252 Old myocardial infarction: Secondary | ICD-10-CM | POA: Diagnosis not present

## 2017-10-31 DIAGNOSIS — D649 Anemia, unspecified: Secondary | ICD-10-CM | POA: Diagnosis not present

## 2017-10-31 DIAGNOSIS — I2510 Atherosclerotic heart disease of native coronary artery without angina pectoris: Secondary | ICD-10-CM | POA: Diagnosis not present

## 2017-10-31 DIAGNOSIS — N189 Chronic kidney disease, unspecified: Secondary | ICD-10-CM | POA: Diagnosis not present

## 2017-10-31 DIAGNOSIS — I129 Hypertensive chronic kidney disease with stage 1 through stage 4 chronic kidney disease, or unspecified chronic kidney disease: Secondary | ICD-10-CM | POA: Diagnosis not present

## 2017-10-31 DIAGNOSIS — E1122 Type 2 diabetes mellitus with diabetic chronic kidney disease: Secondary | ICD-10-CM | POA: Diagnosis not present

## 2017-10-31 DIAGNOSIS — E785 Hyperlipidemia, unspecified: Secondary | ICD-10-CM | POA: Diagnosis not present

## 2017-11-04 DIAGNOSIS — E119 Type 2 diabetes mellitus without complications: Secondary | ICD-10-CM | POA: Diagnosis not present

## 2017-11-04 DIAGNOSIS — E785 Hyperlipidemia, unspecified: Secondary | ICD-10-CM | POA: Diagnosis not present

## 2017-11-04 DIAGNOSIS — I10 Essential (primary) hypertension: Secondary | ICD-10-CM | POA: Diagnosis not present

## 2017-11-07 ENCOUNTER — Ambulatory Visit (HOSPITAL_BASED_OUTPATIENT_CLINIC_OR_DEPARTMENT_OTHER)
Admission: RE | Admit: 2017-11-07 | Discharge: 2017-11-07 | Disposition: A | Discharge status: Home / Self Care | Payer: Medicare Other | Source: Ambulatory Visit | Attending: Medical | Admitting: Medical

## 2017-11-07 ENCOUNTER — Encounter: Payer: Self-pay | Admitting: Medical

## 2017-11-07 ENCOUNTER — Ambulatory Visit (INDEPENDENT_AMBULATORY_CARE_PROVIDER_SITE_OTHER): Payer: Medicare Other | Admitting: Medical

## 2017-11-07 VITALS — BP 132/47 | HR 59 | Temp 98.1°F | Resp 16 | Ht 62.0 in | Wt 140.0 lb

## 2017-11-07 DIAGNOSIS — G8929 Other chronic pain: Secondary | ICD-10-CM | POA: Diagnosis not present

## 2017-11-07 DIAGNOSIS — M546 Pain in thoracic spine: Secondary | ICD-10-CM

## 2017-11-07 NOTE — Patient Instructions (Addendum)
For your chronic back pain in the thoracic area I want you to continue with Tylenol but could also use salon pas lidocaine patch.  Cannot use NSAIDs due to your kidney insufficiency, and you expressed desire not to use narcotic type medication.  I do think the lidocaine patch is reasonable option or you could use ThermaCare heat pads.  We will get x-rays today to see if you have any compression of thoracic vertebrae or degenerative changes.  I do think the pain is related to the vertebrae or muscles since you report when you lie supine the pain is resolved.  If you have any other symptoms associated with thoracic pain such as shoulder pain, shortness of breath or chest pain then recommend emergency department evaluation.  Follow-up in 7-10 days or as needed.

## 2017-11-07 NOTE — Progress Notes (Signed)
Subjective:    Patient ID: Samantha Mckenzie, female    DOB: [birth year], 82 y.o.   MRN: [id number]  HPI  Pt in with some upper back pain for quite sometime per pt. Pt states pain for 4-5 months.   Pt states pain will come and goes. She states pain mostly end of the day. During the day sometimes when she lays flat on her back can relieve pain. No pain at night.  Notes when lays down during the day or at night does not have back pain.  Pt not taking anything for pain presently. She does not like taking medications.   Pt never has any chest pain, no shortness of breath or shoulder pain related to her back pain.  In august 2018. Pt had ekg done by Dr. Nani Ravens. Prior ct abdomen does not mention and anuersym.   Pt present pain in her back is about level 1/10 presently. When she rests supine pain will decrease.  At times  pain level increase to 3-5/10.  She expresses does not want to be on any strong medication.   Review of Systems  Constitutional: Negative for chills, fatigue and fever.  Respiratory: Negative for cough, chest tightness, shortness of breath and wheezing.   Cardiovascular: Negative for chest pain and palpitations.  Musculoskeletal: Positive for back pain. Negative for arthralgias, myalgias, neck pain and neck stiffness.  Skin: Negative for rash.  Neurological: Negative for dizziness, speech difficulty, numbness and headaches.  Psychiatric/Behavioral: Negative for behavioral problems and confusion.   Past Medical History:  Diagnosis Date  . Allergic rhinitis   . Anemia   . Breast CA ([place])    surgery, chemo, XRT; had peripheral neuropathy (imbalance at times) after chemo  . Cellulitis    Left arm, recurrent   . Chronic renal disease, stage IV (HCC)    Dr. Moshe Cipro  . Depression   . Fatigue   . GERD (gastroesophageal reflux disease)    gastritis, EGD 02/2007  . Gout   . Hyperlipidemia   . Hypertension   . Macular degeneration 2011  . Normal cardiac stress  test 12/2014  . Osteoarthritis   . Renal insufficiency      Social History   Socioeconomic History  . Marital status: Married    Spouse name: Not on file  . Number of children: 2  . Years of education: Not on file  . Highest education level: Not on file  Social Needs  . Financial resource strain: Not on file  . Food insecurity - worry: Not on file  . Food insecurity - inability: Not on file  . Transportation needs - medical: Not on file  . Transportation needs - non-medical: Not on file  Occupational History  . Occupation: retired     Fish farm manager: RETIRED  Tobacco Use  . Smoking status: Never Smoker  . Smokeless tobacco: Never Used  Substance and Sexual Activity  . Alcohol use: Yes    Alcohol/week: 0.0 oz    Comment: rarely  . Drug use: No  . Sexual activity: Not on file  Other Topics Concern  . Not on file  Social History Narrative   Lives w/ husband , 2 children in [place], [place]            Past Surgical History:  Procedure Laterality Date  . cataracts bilaterally  1 -2012  . LUNG BIOPSY  1999   neg  . MASTECTOMY Left 1999   and lymphnodes     Family History  Problem Relation Age of Onset  . Breast cancer Other        ? of   . Heart attack Father 65  . Diabetes Sister        2  . Hypertension Sister   . Colon cancer Neg Hx   . Stomach cancer Neg Hx     Allergies  Allergen Reactions  . Codeine Nausea And Vomiting  . Morphine Nausea And Vomiting  . Oxycodone-Acetaminophen Nausea And Vomiting  . Sulfonamide Derivatives Nausea And Vomiting  . Tramadol Hcl Nausea And Vomiting    Current Outpatient Medications on File Prior to Visit  Medication Sig Dispense Refill  . allopurinol (ZYLOPRIM) 100 MG tablet Take 1 tablet (100 mg total) by mouth daily. 90 tablet 1  . amLODipine (NORVASC) 5 MG tablet Take 1 tablet (5 mg total) by mouth daily. 90 tablet 1  . aspirin 81 MG tablet Take 81 mg by mouth daily.      Marland Kitchen atorvastatin (LIPITOR) 20 MG tablet Take 1 tablet  (20 mg total) by mouth daily. 90 tablet 1  . Cholecalciferol (VITAMIN D3) 1000 UNITS CAPS Take 1 capsule by mouth daily.     Marland Kitchen esomeprazole (NEXIUM) 40 MG capsule Take 40 mg by mouth daily at 12 noon.    . gabapentin (NEURONTIN) 100 MG capsule Take 1-2 capsules (100-200 mg total) by mouth at bedtime. 180 capsule 1  . metoprolol tartrate (LOPRESSOR) 25 MG tablet Take 1 tablet (25 mg total) by mouth daily. 90 tablet 2  . Multiple Vitamins-Minerals (CENTRUM SILVER PO) Take 1 tablet by mouth daily.     . valsartan (DIOVAN) 320 MG tablet Take 160 mg by mouth daily. Take one-half tablet daily.    . vitamin B-12 (CYANOCOBALAMIN) 1000 MCG tablet Take 1,000 mcg by mouth daily.       No current facility-administered medications on file prior to visit.     BP (!) 132/47   Pulse (!) 59   Temp 98.1 F (36.7 C) (Oral)   Resp 16   Ht 5' 2"  (1.575 m)   Wt 140 lb (63.5 kg)   SpO2 100%   BMI 25.61 kg/m       Objective:   Physical Exam  General- No acute distress. Pleasant patient. Neck- Full range of motion, no jvd Lungs- Clear, even and unlabored. Heart- regular rate and rhythm. Neurologic- CNII- XII grossly intact.  Back- mild paraspinal tenderness to palpation level of scapula. Pain 1/10 when seated. But when lies  supine pain is resolved.       Assessment & Plan:  For your chronic back pain in the thoracic area I want you to continue with Tylenol but could also use salon pas lidocaine patch.  Cannot use NSAIDs due to your kidney insufficiency, and you expressed desire not to use narcotic type medication.  I do think the lidocaine patch is reasonable option or you could use ThermaCare heat pads.  We will get x-rays today to see if you have any compression of thoracic vertebrae or degenerative changes.  I do think the pain is related to the vertebrae or muscles since you report when you lie supine the pain is resolved.  If you have any other symptoms associated with thoracic pain such as  shoulder pain, shortness of breath or chest pain then recommend emergency department evaluation.  Follow-up in 7-10 days or as needed.  Mackie Pai, PA-C

## 2017-11-10 DIAGNOSIS — N189 Chronic kidney disease, unspecified: Secondary | ICD-10-CM | POA: Diagnosis not present

## 2017-11-10 DIAGNOSIS — I10 Essential (primary) hypertension: Secondary | ICD-10-CM | POA: Diagnosis not present

## 2017-11-10 DIAGNOSIS — E119 Type 2 diabetes mellitus without complications: Secondary | ICD-10-CM | POA: Diagnosis not present

## 2017-11-10 DIAGNOSIS — E785 Hyperlipidemia, unspecified: Secondary | ICD-10-CM | POA: Diagnosis not present

## 2017-12-13 ENCOUNTER — Other Ambulatory Visit: Payer: Self-pay | Admitting: Internal Medicine

## 2018-01-03 ENCOUNTER — Other Ambulatory Visit: Payer: Self-pay | Admitting: Internal Medicine

## 2018-02-21 DIAGNOSIS — D649 Anemia, unspecified: Secondary | ICD-10-CM | POA: Diagnosis not present

## 2018-02-21 DIAGNOSIS — I129 Hypertensive chronic kidney disease with stage 1 through stage 4 chronic kidney disease, or unspecified chronic kidney disease: Secondary | ICD-10-CM | POA: Diagnosis not present

## 2018-02-21 DIAGNOSIS — E785 Hyperlipidemia, unspecified: Secondary | ICD-10-CM | POA: Diagnosis not present

## 2018-02-21 DIAGNOSIS — E1122 Type 2 diabetes mellitus with diabetic chronic kidney disease: Secondary | ICD-10-CM | POA: Diagnosis not present

## 2018-02-21 DIAGNOSIS — N189 Chronic kidney disease, unspecified: Secondary | ICD-10-CM | POA: Diagnosis not present

## 2018-02-21 DIAGNOSIS — I2510 Atherosclerotic heart disease of native coronary artery without angina pectoris: Secondary | ICD-10-CM | POA: Diagnosis not present

## 2018-02-22 ENCOUNTER — Ambulatory Visit (INDEPENDENT_AMBULATORY_CARE_PROVIDER_SITE_OTHER): Payer: Medicare Other | Admitting: Medical

## 2018-02-22 ENCOUNTER — Encounter: Payer: Self-pay | Admitting: Medical

## 2018-02-22 VITALS — BP 169/56 | HR 56 | Temp 97.6°F | Resp 16 | Ht 62.0 in | Wt 140.4 lb

## 2018-02-22 DIAGNOSIS — F329 Major depressive disorder, single episode, unspecified: Secondary | ICD-10-CM | POA: Diagnosis not present

## 2018-02-22 DIAGNOSIS — E875 Hyperkalemia: Secondary | ICD-10-CM | POA: Diagnosis not present

## 2018-02-22 DIAGNOSIS — G8929 Other chronic pain: Secondary | ICD-10-CM

## 2018-02-22 DIAGNOSIS — F32A Depression, unspecified: Secondary | ICD-10-CM

## 2018-02-22 DIAGNOSIS — M546 Pain in thoracic spine: Secondary | ICD-10-CM | POA: Diagnosis not present

## 2018-02-22 MED ORDER — SERTRALINE HCL 25 MG PO TABS: 25.0000 mg | ORAL_TABLET | Freq: Every day | ORAL | 0 refills | Status: DC

## 2018-02-22 MED ORDER — GABAPENTIN 100 MG PO CAPS: 100.0000 mg | ORAL_CAPSULE | Freq: Three times a day (TID) | ORAL | 0 refills | Status: DC

## 2018-02-22 NOTE — Progress Notes (Signed)
Subjective:    Patient ID: Samantha Mckenzie, female    DOB: 08/11/1936, 82 y.o.   MRN: [id number]  HPI  Pt in with back pain. Still in her bilateral thoracic. Pt xray of t spine did not show degenerative changes that were significant. Pain level  5-6/10. Most of time pain during the day. Pt is taking tylenol but not helping much.  Pain has been present since around January at least.  On discussion of her pain she seems to have forgotten that I saw her for the same problem in January.  Pt can't take nsaid due to kidney functions.  Also has a lot of allergies to narcotics.  Unfortunately Tylenol did not help much at all as stated above.  She does have kidney specialist.  Pain varies from day to day.   Pt does report some feeling of sadness. Not sure why. Just reports generalized sadness. Poor motivation. Decreased energy.  She does reflect an state that her life is not what it used to be.  Expresses some sadness about her general health.  Pt does read the psalms in the bible.  She reports reading the Bible does encourage her.     Review of Systems  Constitutional: Negative for chills.  Respiratory: Negative for cough, chest tightness, shortness of breath and wheezing.   Cardiovascular: Negative for chest pain and palpitations.  Gastrointestinal: Negative for abdominal pain.  Genitourinary: Negative for dyspareunia.  Musculoskeletal: Positive for back pain.  Neurological: Negative for dizziness, seizures, syncope, weakness, light-headedness and headaches.  Hematological: Negative for adenopathy. Does not bruise/bleed easily.  Psychiatric/Behavioral: Positive for dysphoric mood. Negative for behavioral problems, confusion and sleep disturbance. The patient is not nervous/anxious.    Past Medical History:  Diagnosis Date  . Allergic rhinitis   . Anemia   . Breast CA ([place])    surgery, chemo, XRT; had peripheral neuropathy (imbalance at times) after chemo  . Cellulitis    Left arm,  recurrent   . Chronic renal disease, stage IV (HCC)    Dr. Moshe Cipro  . Depression   . Fatigue   . GERD (gastroesophageal reflux disease)    gastritis, EGD 02/2007  . Gout   . Hyperlipidemia   . Hypertension   . Macular degeneration 2011  . Normal cardiac stress test 12/2014  . Osteoarthritis   . Renal insufficiency      Social History   Socioeconomic History  . Marital status: Married    Spouse name: Not on file  . Number of children: 2  . Years of education: Not on file  . Highest education level: Not on file  Occupational History  . Occupation: retired     Fish farm manager: RETIRED  Social Needs  . Financial resource strain: Not on file  . Food insecurity:    Worry: Not on file    Inability: Not on file  . Transportation needs:    Medical: Not on file    Non-medical: Not on file  Tobacco Use  . Smoking status: Never Smoker  . Smokeless tobacco: Never Used  Substance and Sexual Activity  . Alcohol use: Yes    Alcohol/week: 0.0 oz    Comment: rarely  . Drug use: No  . Sexual activity: Not on file  Lifestyle  . Physical activity:    Days per week: Not on file    Minutes per session: Not on file  . Stress: Not on file  Relationships  . Social connections:    Talks on  phone: Not on file    Gets together: Not on file    Attends religious service: Not on file    Active member of club or organization: Not on file    Attends meetings of clubs or organizations: Not on file    Relationship status: Not on file  . Intimate partner violence:    Fear of current or ex partner: Not on file    Emotionally abused: Not on file    Physically abused: Not on file    Forced sexual activity: Not on file  Other Topics Concern  . Not on file  Social History Narrative   Lives w/ husband , 2 children in [place], [place]            Past Surgical History:  Procedure Laterality Date  . cataracts bilaterally  1 -2012  . LUNG BIOPSY  1999   neg  . MASTECTOMY Left 1999   and lymphnodes      Family History  Problem Relation Age of Onset  . Breast cancer Other        ? of   . Heart attack Father 23  . Diabetes Sister        2  . Hypertension Sister   . Colon cancer Neg Hx   . Stomach cancer Neg Hx     Allergies  Allergen Reactions  . Codeine Nausea And Vomiting  . Morphine Nausea And Vomiting  . Oxycodone-Acetaminophen Nausea And Vomiting  . Sulfonamide Derivatives Nausea And Vomiting  . Tramadol Hcl Nausea And Vomiting    Current Outpatient Medications on File Prior to Visit  Medication Sig Dispense Refill  . allopurinol (ZYLOPRIM) 100 MG tablet Take 1 tablet (100 mg total) by mouth daily. 90 tablet 1  . amLODipine (NORVASC) 5 MG tablet Take 1 tablet (5 mg total) by mouth daily. 90 tablet 0  . aspirin 81 MG tablet Take 81 mg by mouth daily.      Marland Kitchen atorvastatin (LIPITOR) 20 MG tablet Take 1 tablet (20 mg total) by mouth daily. 90 tablet 1  . Cholecalciferol (VITAMIN D3) 1000 UNITS CAPS Take 1 capsule by mouth daily.     Marland Kitchen esomeprazole (NEXIUM) 40 MG capsule Take 40 mg by mouth daily at 12 noon.    . metoprolol tartrate (LOPRESSOR) 25 MG tablet Take 1 tablet (25 mg total) by mouth daily. 90 tablet 2  . Multiple Vitamins-Minerals (CENTRUM SILVER PO) Take 1 tablet by mouth daily.     . valsartan (DIOVAN) 320 MG tablet Take 160 mg by mouth daily. Take one-half tablet daily.    . vitamin B-12 (CYANOCOBALAMIN) 1000 MCG tablet Take 1,000 mcg by mouth daily.       No current facility-administered medications on file prior to visit.     BP (!) 169/56   Pulse (!) 56   Temp 97.6 F (36.4 C) (Oral)   Resp 16   Ht 5' 2"  (1.575 m)   Wt 140 lb 6.4 oz (63.7 kg)   SpO2 100%   BMI 25.68 kg/m       Objective:   Physical Exam   General Mental Status- Alert. General Appearance- Not in acute distress.   Skin General: Color- Normal Color. Moisture- Normal Moisture.  Neck Carotid Arteries- Normal color. Moisture- Normal Moisture. No carotid bruits. No  JVD.  Chest and Lung Exam Auscultation: Breath Sounds:-Normal.  Cardiovascular Auscultation:Rythm- Regular. Murmurs & Other Heart Sounds:Auscultation of the heart reveals- No Murmurs.  Abdomen Inspection:-Inspeection Normal. Palpation/Percussion:Note:No  mass. Palpation and Percussion of the abdomen reveal- Non Tender, Non Distended + BS, no rebound or guarding.    Neurologic Cranial Nerve exam:- CN III-XII intact(No nystagmus), symmetric smile. Strength:- 5/5 equal and symmetric strength both upper and lower extremities.  Back- bilateral para-Thoracic tender.  No mid spine tenderness to palpation presently.     Assessment & Plan:  For your depression recently, I wrote for sertraline 25 mg daily. Will see if this helps.   Also for back pain will try low dose gabapentin. This is not my first choice your allergy to narcotics, poor response to tylenol and very low gfr limits nsaid use.  Use gapentin up to 3 times daily if needed.(sedation side effect so maybe start out just using one tab at night)  Follow up in 2 weeks or as needed  Note patient does point out that she was on gabapentin in the past and it did help.  She was only taking it 1 to 2 tablets at night.  She specifically states that did not over sedate her.  I did go ahead and send new prescription to her pharmacy.  She can use it 1 tablet 3 times daily as needed for pain.  Or I explained to her she could take 2 tablets at night as she sometimes does and then take 1 tablet midday.  Note also did discuss with our pharmacist dosing and he thought 3 tablets a day would be safe.  Reviewed due to her decreased GFR.  Mackie Pai, PA-C

## 2018-02-22 NOTE — Patient Instructions (Addendum)
For your depression recently, I wrote for sertraline 25 mg daily. Will see if this helps.   Also for back pain will try low dose gabapentin. This is not my first choice your allergy to narcotics, poor response to tylenol and very low gfr limits nsaid use.  Use gapentin up to 3 times daily if needed.(sedation side effect so maybe start out just using one tab at night)  Follow up in 2 weeks or as needed

## 2018-02-23 LAB — COMPREHENSIVE METABOLIC PANEL
ALK PHOS: 74 U/L (ref 39–117)
ALT: 15 U/L (ref 0–35)
AST: 20 U/L (ref 0–37)
Albumin: 3.9 g/dL (ref 3.5–5.2)
BUN: 48 mg/dL — ABNORMAL HIGH (ref 6–23)
CO2: 28 meq/L (ref 19–32)
Calcium: 8.7 mg/dL (ref 8.4–10.5)
Chloride: 101 mEq/L (ref 96–112)
Creatinine, Ser: 2.28 mg/dL — ABNORMAL HIGH (ref 0.40–1.20)
GFR: 21.93 mL/min — AB (ref >60.00)
GLUCOSE: 127 mg/dL — AB (ref 70–99)
Potassium: 4.2 mEq/L (ref 3.5–5.1)
Sodium: 138 mEq/L (ref 135–145)
TOTAL PROTEIN: 7.2 g/dL (ref 6.0–8.3)
Total Bilirubin: 0.3 mg/dL (ref 0.2–1.2)

## 2018-04-26 DIAGNOSIS — H353131 Nonexudative age-related macular degeneration, bilateral, early dry stage: Secondary | ICD-10-CM | POA: Diagnosis not present

## 2018-04-26 LAB — HM DIABETES EYE EXAM

## 2018-04-27 ENCOUNTER — Encounter: Payer: Self-pay | Admitting: Internal Medicine

## 2018-05-09 ENCOUNTER — Other Ambulatory Visit: Payer: Self-pay | Admitting: Internal Medicine

## 2018-05-16 ENCOUNTER — Ambulatory Visit: Payer: Medicare Other | Admitting: Internal Medicine

## 2018-05-16 ENCOUNTER — Other Ambulatory Visit: Payer: Self-pay | Admitting: Internal Medicine

## 2018-05-16 MED ORDER — ALLOPURINOL 100 MG PO TABS: 100.0000 mg | ORAL_TABLET | Freq: Every day | ORAL | 1 refills | Status: DC

## 2018-05-16 MED ORDER — AMLODIPINE BESYLATE 5 MG PO TABS: 5.0000 mg | ORAL_TABLET | Freq: Every day | ORAL | 1 refills | Status: DC

## 2018-05-16 MED ORDER — METOPROLOL TARTRATE 25 MG PO TABS: 25.0000 mg | ORAL_TABLET | Freq: Every day | ORAL | 1 refills | Status: DC

## 2018-05-16 MED ORDER — ATORVASTATIN CALCIUM 20 MG PO TABS: 20.0000 mg | ORAL_TABLET | Freq: Every day | ORAL | 1 refills | Status: DC

## 2018-05-16 NOTE — Telephone Encounter (Signed)
Requested Rx's sent to [facility].

## 2018-05-16 NOTE — Telephone Encounter (Signed)
Copied from [place] [id number]. Topic: Quick Communication - See Telephone Encounter >> May 16, 2018 11:43 AM Rosalin Hawking wrote: CRM for notification. See Telephone encounter for: 05/16/18.    Pt needing refill on Atorvastatin 20 mg tab, Metoprolol Tartrate 25 mg, Allopurinol tab 137m and Anlodopine Besylate tabs 5 mg for 30 days. Pt is rescheduling her appt and will come back to see provider another day. Please advise.

## 2018-05-22 MED ORDER — METOPROLOL TARTRATE 25 MG PO TABS: 25.0000 mg | ORAL_TABLET | Freq: Every day | ORAL | 0 refills | Status: DC

## 2018-05-22 MED ORDER — ALLOPURINOL 100 MG PO TABS: 100.0000 mg | ORAL_TABLET | Freq: Every day | ORAL | 0 refills | Status: DC

## 2018-05-22 MED ORDER — AMLODIPINE BESYLATE 5 MG PO TABS: 5.0000 mg | ORAL_TABLET | Freq: Every day | ORAL | 0 refills | Status: DC

## 2018-05-22 MED ORDER — ATORVASTATIN CALCIUM 20 MG PO TABS: 20.0000 mg | ORAL_TABLET | Freq: Every day | ORAL | 0 refills | Status: DC

## 2018-05-22 NOTE — Telephone Encounter (Signed)
Rx sent to [facility] discontinued by Damita Dunnings, CMA, and resent to [facility] per pt's request.

## 2018-05-22 NOTE — Addendum Note (Signed)
Addended byDamita Dunnings D on: 05/22/2018 09:28 AM   Modules accepted: Orders

## 2018-05-22 NOTE — Telephone Encounter (Signed)
Pt would like to see if these medications can be resent to [facility] instead of [facility].

## 2018-05-22 NOTE — Telephone Encounter (Signed)
Rx's sent to [facility].

## 2018-06-05 ENCOUNTER — Ambulatory Visit (HOSPITAL_BASED_OUTPATIENT_CLINIC_OR_DEPARTMENT_OTHER)
Admission: RE | Admit: 2018-06-05 | Discharge: 2018-06-05 | Disposition: A | Discharge status: Home / Self Care | Payer: Medicare Other | Source: Ambulatory Visit | Attending: Medical | Admitting: Medical

## 2018-06-05 ENCOUNTER — Ambulatory Visit (INDEPENDENT_AMBULATORY_CARE_PROVIDER_SITE_OTHER): Payer: Medicare Other | Admitting: Medical

## 2018-06-05 ENCOUNTER — Encounter: Payer: Self-pay | Admitting: Medical

## 2018-06-05 ENCOUNTER — Telehealth: Payer: Self-pay | Admitting: Medical

## 2018-06-05 VITALS — BP 156/60 | HR 62 | Temp 98.3°F | Resp 16 | Ht 62.0 in | Wt 136.2 lb

## 2018-06-05 DIAGNOSIS — M7989 Other specified soft tissue disorders: Secondary | ICD-10-CM | POA: Insufficient documentation

## 2018-06-05 DIAGNOSIS — L089 Local infection of the skin and subcutaneous tissue, unspecified: Secondary | ICD-10-CM

## 2018-06-05 DIAGNOSIS — R5383 Other fatigue: Secondary | ICD-10-CM

## 2018-06-05 DIAGNOSIS — R6883 Chills (without fever): Secondary | ICD-10-CM

## 2018-06-05 MED ORDER — CEFTRIAXONE SODIUM 1 G IJ SOLR
1.0000 g | Freq: Once | INTRAMUSCULAR | Status: AC
  Administered 2018-06-05: 1 g via INTRAMUSCULAR

## 2018-06-05 MED ORDER — DOXYCYCLINE HYCLATE 100 MG PO TABS: 100.0000 mg | ORAL_TABLET | Freq: Two times a day (BID) | ORAL | 0 refills | Status: DC

## 2018-06-05 NOTE — Telephone Encounter (Signed)
Sent doxycycline antibiotic to your pharmacy.

## 2018-06-05 NOTE — Progress Notes (Signed)
Subjective:    Patient ID: Samantha Mckenzie, female    DOB: [birth year], 82 y.o.   MRN: [id number]  HPI   Pt has history of breast cancer and has lymphedema. She had swollen left forearm in past. She had describes probably skin infection cellulitis twice in past but this was remote maybe 20 years ago(but one time in recent past on review of Dr. Larose Kells note). She does not last Wednesday she was feeling chills and she felt fatigued. That Wednesday night she stats was fatigued/wiped out. That night paramedics told her she had a fever and her bp was little high. They offered her ED evaluation. She took tylenol fever came down and she felt overall better next morning but since then feels fatigued.   Pt notes that yesterday her left arm felt warm and swollen. She typically does not wear compression sleeve.    Review of Systems  Constitutional: Positive for fatigue and fever. Negative for chills.  Respiratory: Negative for cough, chest tightness, shortness of breath and wheezing.   Cardiovascular: Negative for chest pain and palpitations.  Gastrointestinal: Negative for abdominal distention, abdominal pain, anal bleeding, constipation and nausea.  Musculoskeletal: Negative for back pain, myalgias, neck pain and neck stiffness.       Left forearm swelling.  Skin: Negative for rash and wound.       See hpi.  Neurological: Negative for dizziness, tremors, weakness and light-headedness.  Hematological: Negative for adenopathy.  Psychiatric/Behavioral: Negative for behavioral problems and confusion.    Past Medical History:  Diagnosis Date  . Allergic rhinitis   . Anemia   . Breast CA ([place])    surgery, chemo, XRT; had peripheral neuropathy (imbalance at times) after chemo  . Cellulitis    Left arm, recurrent   . Chronic renal disease, stage IV (HCC)    Dr. Moshe Cipro  . Depression   . Fatigue   . GERD (gastroesophageal reflux disease)    gastritis, EGD 02/2007  . Gout   . Hyperlipidemia    . Hypertension   . Macular degeneration 2011  . Normal cardiac stress test 12/2014  . Osteoarthritis   . Renal insufficiency      Social History   Socioeconomic History  . Marital status: Married    Spouse name: Not on file  . Number of children: 2  . Years of education: Not on file  . Highest education level: Not on file  Occupational History  . Occupation: retired     Fish farm manager: RETIRED  Social Needs  . Financial resource strain: Not on file  . Food insecurity:    Worry: Not on file    Inability: Not on file  . Transportation needs:    Medical: Not on file    Non-medical: Not on file  Tobacco Use  . Smoking status: Never Smoker  . Smokeless tobacco: Never Used  Substance and Sexual Activity  . Alcohol use: Yes    Alcohol/week: 0.0 standard drinks    Comment: rarely  . Drug use: No  . Sexual activity: Not on file  Lifestyle  . Physical activity:    Days per week: Not on file    Minutes per session: Not on file  . Stress: Not on file  Relationships  . Social connections:    Talks on phone: Not on file    Gets together: Not on file    Attends religious service: Not on file    Active member of club or organization: Not on file  Attends meetings of clubs or organizations: Not on file    Relationship status: Not on file  . Intimate partner violence:    Fear of current or ex partner: Not on file    Emotionally abused: Not on file    Physically abused: Not on file    Forced sexual activity: Not on file  Other Topics Concern  . Not on file  Social History Narrative   Lives w/ husband , 2 children in [place], [place]            Past Surgical History:  Procedure Laterality Date  . cataracts bilaterally  1 -2012  . LUNG BIOPSY  1999   neg  . MASTECTOMY Left 1999   and lymphnodes     Family History  Problem Relation Age of Onset  . Breast cancer Other        ? of   . Heart attack Father 74  . Diabetes Sister        2  . Hypertension Sister   . Colon  cancer Neg Hx   . Stomach cancer Neg Hx     Allergies  Allergen Reactions  . Codeine Nausea And Vomiting  . Morphine Nausea And Vomiting  . Oxycodone-Acetaminophen Nausea And Vomiting  . Sulfonamide Derivatives Nausea And Vomiting  . Tramadol Hcl Nausea And Vomiting    Current Outpatient Medications on File Prior to Visit  Medication Sig Dispense Refill  . allopurinol (ZYLOPRIM) 100 MG tablet Take 1 tablet (100 mg total) by mouth daily. 90 tablet 0  . amLODipine (NORVASC) 5 MG tablet Take 1 tablet (5 mg total) by mouth daily. 90 tablet 0  . aspirin 81 MG tablet Take 81 mg by mouth daily.      Marland Kitchen atorvastatin (LIPITOR) 20 MG tablet Take 1 tablet (20 mg total) by mouth daily. 90 tablet 0  . Cholecalciferol (VITAMIN D3) 1000 UNITS CAPS Take 1 capsule by mouth daily.     Marland Kitchen esomeprazole (NEXIUM) 40 MG capsule Take 40 mg by mouth daily at 12 noon.    . gabapentin (NEURONTIN) 100 MG capsule Take 1 capsule (100 mg total) by mouth 3 (three) times daily. 180 capsule 0  . metoprolol tartrate (LOPRESSOR) 25 MG tablet Take 1 tablet (25 mg total) by mouth daily. 90 tablet 0  . Multiple Vitamins-Minerals (CENTRUM SILVER PO) Take 1 tablet by mouth daily.     . sertraline (ZOLOFT) 25 MG tablet Take 1 tablet (25 mg total) by mouth daily. 30 tablet 0  . valsartan (DIOVAN) 320 MG tablet Take 160 mg by mouth daily. Take one-half tablet daily.    . vitamin B-12 (CYANOCOBALAMIN) 1000 MCG tablet Take 1,000 mcg by mouth daily.       No current facility-administered medications on file prior to visit.     BP (!) 156/60   Pulse 62   Temp 98.3 F (36.8 C) (Oral)   Resp 16   Ht 5' 2"  (1.575 m)   Wt 136 lb 3.2 oz (61.8 kg)   SpO2 99%   BMI 24.91 kg/m       Objective:   Physical Exam  General- No acute distress. Pleasant patient. Neck- Full range of motion, no jvd Lungs- Clear, even and unlabored. Heart- regular rate and rhythm. Neurologic- CNII- XII grossly intact.  Left upper ex- moderate to  severe edema of left forearm. Skin mild pink and warm to touch. No fluctuance. Upper arm at bicep level not swollen.  Assessment & Plan:  You do have history of lymphedema following your prior breast cancer and surgery.  But you also have history of cellulitis of the left upper extremity.  The recent chills and fatigue might be related to infection.  We will get CBC and CMP.  We will also add TSH and B vitamin studies.  We gave you Rocephin 1 g injection today pending results of labs and left upper extremity ultrasound study.  The study will be done tonight at 8:30 PM.  We will need to call you later tonight with the results.  If you do have DVT in upper extremity then would need to make decision if ED evaluation would be necessary.  The soonest outpatient ultrasound is tonight after hours at 8:30.  If no dvt found then will call in/send additional oral antibiotic for your to start tomorrow.  Follow-up date to be determined after imaging and ultrasound review.  Mackie Pai, PA-C

## 2018-06-05 NOTE — Patient Instructions (Addendum)
You do have history of lymphedema following your prior breast cancer and surgery.  But you also have history of cellulitis of the left upper extremity.  The recent chills and fatigue might be related to infection.  We will get CBC and CMP.  We will also add TSH and B vitamin studies.  We gave you Rocephin 1 g injection today pending results of labs and left upper extremity ultrasound study.  The study will be done tonight at 8:30 PM.  We will need to call you later tonight with the results.  If you do have DVT in upper extremity then would need to make decision if ED evaluation would be necessary.  The soonest outpatient ultrasound is tonight after hours at 8:30.  If no dvt found then will call in/send additional oral antibiotic for your to start tomorrow.  Follow-up date to be determined after imaging and ultrasound review.

## 2018-06-06 LAB — CBC WITH DIFFERENTIAL/PLATELET
BASOS PCT: 0.6 % (ref 0.0–3.0)
Basophils Absolute: 0 10*3/uL (ref 0.0–0.1)
EOS PCT: 1.3 % (ref 0.0–5.0)
Eosinophils Absolute: 0.1 10*3/uL (ref 0.0–0.7)
HCT: 32.2 % — ABNORMAL LOW (ref 36.0–46.0)
Hemoglobin: 10.6 g/dL — ABNORMAL LOW (ref 12.0–15.0)
LYMPHS ABS: 2.2 10*3/uL (ref 0.7–4.0)
Lymphocytes Relative: 31.3 % (ref 12.0–46.0)
MCHC: 32.8 g/dL (ref 30.0–36.0)
MCV: 95.3 fl (ref 78.0–100.0)
MONO ABS: 0.6 10*3/uL (ref 0.1–1.0)
Monocytes Relative: 8.2 % (ref 3.0–12.0)
NEUTROS PCT: 58.6 % (ref 43.0–77.0)
Neutro Abs: 4.1 10*3/uL (ref 1.4–7.7)
PLATELETS: 244 10*3/uL (ref 150.0–400.0)
RBC: 3.38 Mil/uL — ABNORMAL LOW (ref 3.87–5.11)
RDW: 15.9 % — AB (ref 11.5–15.5)
WBC: 7.1 10*3/uL (ref 4.0–10.5)

## 2018-06-06 LAB — TSH: TSH: 1.85 u[IU]/mL (ref 0.35–4.50)

## 2018-06-06 LAB — COMPREHENSIVE METABOLIC PANEL
ALBUMIN: 4 g/dL (ref 3.5–5.2)
ALT: 19 U/L (ref 0–35)
AST: 21 U/L (ref 0–37)
Alkaline Phosphatase: 64 U/L (ref 39–117)
BUN: 60 mg/dL — AB (ref 6–23)
CHLORIDE: 102 meq/L (ref 96–112)
CO2: 27 mEq/L (ref 19–32)
Calcium: 9.5 mg/dL (ref 8.4–10.5)
Creatinine, Ser: 2.34 mg/dL — ABNORMAL HIGH (ref 0.40–1.20)
GFR: 21.27 mL/min — ABNORMAL LOW (ref >60.00)
GLUCOSE: 103 mg/dL — AB (ref 70–99)
POTASSIUM: 5.2 meq/L — AB (ref 3.5–5.1)
SODIUM: 138 meq/L (ref 135–145)
Total Bilirubin: 0.5 mg/dL (ref 0.2–1.2)
Total Protein: 7.3 g/dL (ref 6.0–8.3)

## 2018-06-06 LAB — VITAMIN B12

## 2018-06-10 LAB — VITAMIN B1: Vitamin B1 (Thiamine): 21 nmol/L (ref 8–30)

## 2018-06-13 ENCOUNTER — Ambulatory Visit (INDEPENDENT_AMBULATORY_CARE_PROVIDER_SITE_OTHER): Payer: Medicare Other | Admitting: Internal Medicine

## 2018-06-13 ENCOUNTER — Encounter: Payer: Self-pay | Admitting: Internal Medicine

## 2018-06-13 VITALS — BP 130/68 | HR 59 | Temp 98.0°F | Resp 16 | Ht 62.0 in | Wt 130.2 lb

## 2018-06-13 DIAGNOSIS — E1122 Type 2 diabetes mellitus with diabetic chronic kidney disease: Secondary | ICD-10-CM

## 2018-06-13 DIAGNOSIS — I10 Essential (primary) hypertension: Secondary | ICD-10-CM

## 2018-06-13 DIAGNOSIS — R197 Diarrhea, unspecified: Secondary | ICD-10-CM

## 2018-06-13 DIAGNOSIS — N182 Chronic kidney disease, stage 2 (mild): Secondary | ICD-10-CM

## 2018-06-13 MED ORDER — AMLODIPINE BESYLATE 10 MG PO TABS: 10.0000 mg | ORAL_TABLET | Freq: Every day | ORAL | 6 refills | Status: DC

## 2018-06-13 MED ORDER — VALSARTAN 160 MG PO TABS: 160.0000 mg | ORAL_TABLET | Freq: Every day | ORAL | 3 refills | Status: DC

## 2018-06-13 MED ORDER — SERTRALINE HCL 25 MG PO TABS: 50.0000 mg | ORAL_TABLET | Freq: Every day | ORAL | 3 refills | Status: DC

## 2018-06-13 NOTE — Assessment & Plan Note (Signed)
DM: Diet controlled, check a A1c HTN: Well-controlled, currently on amlodipine 5 mg, Diovan 320 mg, potassium has been moderately elevated on and off.  Patient quite concerned. Plan: Increase amlodipine to 10 mg daily decrease Diovan to 160.  Monitor BPs.  BMP in 2 weeks. Depression?  See above, PHQ 9 scored 9 which is mild depression.  Samantha Mckenzie is started Zoloft 11 days ago, increase Zoloft to 50 mg Diarrhea: Chronic, worse for the last few weeks, they take antibiotics but by then the diarrhea was already worse.  Will get stool studies including C. difficile, WBCs and a culture.  Further advised with results, consider further eval by GI. RTC for blood work in 2 weeks and follow-up visit in 4 weeks.

## 2018-06-13 NOTE — Progress Notes (Signed)
Subjective:    Patient ID: Samantha Mckenzie, female    DOB: 03/03/1936, 82 y.o.   MRN: [id number]  DOS:  06/13/2018 Type of visit - description : f/u Interval history: Since the last office visit was seen by Iline Oven twice. On 02/22/2018, he suspected depression, was prescribed Zoloft, did not start until 11 days ago when she felt fatigue, somewhat withdrawal, and felt that maybe Zoloft would help.  Has not seen any difference. Was seen with arm cellulitis 06/05/2018, ultrasound negative for DVT, was Rx antibiotics, she is better. Also report 1 year history of diarrhea, worse for the last month.  Stools are yellow, loose, has anywhere between 1 to 4 BMs daily. No associated with blood in the stools, mucus. HTN: Very well controlled per ambulatory readings, did not bring her log today.  Review of Systems Continue with occasional dizziness. No dysuria, gross hematuria No suicidal ideas.   Past Medical History:  Diagnosis Date  . Allergic rhinitis   . Anemia   . Breast CA ([place])    surgery, chemo, XRT; had peripheral neuropathy (imbalance at times) after chemo  . Cellulitis    Left arm, recurrent   . Chronic renal disease, stage IV (HCC)    Dr. Moshe Cipro  . Depression   . Fatigue   . GERD (gastroesophageal reflux disease)    gastritis, EGD 02/2007  . Gout   . Hyperlipidemia   . Hypertension   . Macular degeneration 2011  . Normal cardiac stress test 12/2014  . Osteoarthritis   . Renal insufficiency     Past Surgical History:  Procedure Laterality Date  . cataracts bilaterally  1 -2012  . LUNG BIOPSY  1999   neg  . MASTECTOMY Left 1999   and lymphnodes     Social History   Socioeconomic History  . Marital status: Married    Spouse name: Not on file  . Number of children: 2  . Years of education: Not on file  . Highest education level: Not on file  Occupational History  . Occupation: retired     Fish farm manager: RETIRED  Social Needs  . Financial resource strain: Not  on file  . Food insecurity:    Worry: Not on file    Inability: Not on file  . Transportation needs:    Medical: Not on file    Non-medical: Not on file  Tobacco Use  . Smoking status: Never Smoker  . Smokeless tobacco: Never Used  Substance and Sexual Activity  . Alcohol use: Yes    Alcohol/week: 0.0 standard drinks    Comment: rarely  . Drug use: No  . Sexual activity: Not on file  Lifestyle  . Physical activity:    Days per week: Not on file    Minutes per session: Not on file  . Stress: Not on file  Relationships  . Social connections:    Talks on phone: Not on file    Gets together: Not on file    Attends religious service: Not on file    Active member of club or organization: Not on file    Attends meetings of clubs or organizations: Not on file    Relationship status: Not on file  . Intimate partner violence:    Fear of current or ex partner: Not on file    Emotionally abused: Not on file    Physically abused: Not on file    Forced sexual activity: Not on file  Other Topics Concern  .  Not on file  Social History Narrative   Lives w/ husband , 2 children in [place], [place]              Allergies as of 06/13/2018      Reactions   Codeine Nausea And Vomiting   Morphine Nausea And Vomiting   Oxycodone-acetaminophen Nausea And Vomiting   Sulfonamide Derivatives Nausea And Vomiting   Tramadol Hcl Nausea And Vomiting      Medication List        Accurate as of 06/13/18 10:30 PM. Always use your most recent med list.          allopurinol 100 MG tablet Commonly known as:  ZYLOPRIM Take 1 tablet (100 mg total) by mouth daily.   amLODipine 10 MG tablet Commonly known as:  NORVASC Take 1 tablet (10 mg total) by mouth daily.   aspirin 81 MG tablet Take 81 mg by mouth daily.   atorvastatin 20 MG tablet Commonly known as:  LIPITOR Take 1 tablet (20 mg total) by mouth daily.   CENTRUM SILVER PO Take 1 tablet by mouth daily.   doxycycline 100 MG  tablet Commonly known as:  VIBRA-TABS Take 1 tablet (100 mg total) by mouth 2 (two) times daily. Can give caps or generic   esomeprazole 40 MG capsule Commonly known as:  NEXIUM Take 40 mg by mouth daily at 12 noon.   gabapentin 100 MG capsule Commonly known as:  NEURONTIN Take 1 capsule (100 mg total) by mouth 3 (three) times daily.   metoprolol tartrate 25 MG tablet Commonly known as:  LOPRESSOR Take 1 tablet (25 mg total) by mouth daily.   sertraline 25 MG tablet Commonly known as:  ZOLOFT Take 2 tablets (50 mg total) by mouth daily.   valsartan 160 MG tablet Commonly known as:  DIOVAN Take 1 tablet (160 mg total) by mouth daily.   vitamin B-12 1000 MCG tablet Commonly known as:  CYANOCOBALAMIN Take 1,000 mcg by mouth daily.   Vitamin D3 1000 units Caps Take 1 capsule by mouth daily.          Objective:   Physical Exam BP 130/68 (BP Location: Right Arm, Patient Position: Sitting, Cuff Size: Small)   Pulse (!) 59   Temp 98 F (36.7 C) (Oral)   Resp 16   Ht 5' 2"  (1.575 m)   Wt 130 lb 3.2 oz (59.1 kg)   SpO2 99%   BMI 23.81 kg/m  General:   Well developed, NAD, see BMI.  HEENT:  Normocephalic . Face symmetric, atraumatic Lungs:  CTA B Normal respiratory effort, no intercostal retractions, no accessory muscle use. Heart: RRR,  no murmur.  no pretibial edema bilaterally  UE: L arm slt swollen, no red or warm, at baseline Abdomen:  Not distended, soft, non-tender. No rebound or rigidity.   Skin: Not pale. Not jaundice Neurologic:  alert & oriented X3.  Speech normal, gait appropriate for age and unassisted Psych--  Cognition and judgment appear intact.  Cooperative with normal attention span and concentration.  Behavior appropriate. No anxious or depressed appearing.     Assessment & Plan:   Assessment  DM- neuropathy (paresthesias, nl pinprick exam), CKD HTN Hyperlipidemia: Lipitor intolerant? CAD? States see Dr Terrence Dupont, no OV  records. (-)  stress test 12-2014 CKD -- sees nephrology Gout GERD, nl EGD 01-2015  H/o Depression Leg pain-- RLS vs neuropathy, on gabapentin since 2013, good results H/o anemia --- no iron def , Cscope 2012, normal  EGD 01-2015 Breast cancer:  --S/p Surgery (L mastectomy), chemotherapy, XRT --Peripheral neuropathy felt to be due to chemotherapy --Recurrent left arm cellulitis DJD  PLAN DM: Diet controlled, check a A1c HTN: Well-controlled, currently on amlodipine 5 mg, Diovan 320 mg, potassium has been moderately elevated on and off.  Patient quite concerned. Plan: Increase amlodipine to 10 mg daily decrease Diovan to 160.  Monitor BPs.  BMP in 2 weeks. Depression?  See above, PHQ 9 scored 9 which is mild depression.  She is started Zoloft 11 days ago, increase Zoloft to 50 mg Diarrhea: Chronic, worse for the last few weeks, they take antibiotics but by then the diarrhea was already worse.  Will get stool studies including C. difficile, WBCs and a culture.  Further advised with results, consider further eval by GI. RTC for blood work in 2 weeks and follow-up visit in 4 weeks.

## 2018-06-13 NOTE — Patient Instructions (Signed)
GO TO THE LAB : Pick up containers to provide a stool sample  GO TO THE FRONT DESK Schedule your next appointment for  blood work only, 2 weeks from today  Schedule a follow-up to see me in 4 weeks  ==== Amlodipine: 10 mg a day Diovan 160 mg a day   Check the  blood pressure  daily Be sure your blood pressure is between 110/65 and  135/85. If it is consistently higher or lower, let me know  === Increase sertraline to 2 tablets a day

## 2018-06-14 ENCOUNTER — Other Ambulatory Visit: Payer: Medicare Other

## 2018-06-14 DIAGNOSIS — R197 Diarrhea, unspecified: Secondary | ICD-10-CM

## 2018-06-15 ENCOUNTER — Emergency Department (HOSPITAL_BASED_OUTPATIENT_CLINIC_OR_DEPARTMENT_OTHER)
Admission: EM | Admit: 2018-06-15 | Discharge: 2018-06-15 | Disposition: A | Discharge status: Home / Self Care | Payer: Medicare Other | Attending: Emergency Medicine | Admitting: Emergency Medicine

## 2018-06-15 ENCOUNTER — Encounter (HOSPITAL_BASED_OUTPATIENT_CLINIC_OR_DEPARTMENT_OTHER): Payer: Self-pay | Admitting: Emergency Medicine

## 2018-06-15 ENCOUNTER — Other Ambulatory Visit: Payer: Self-pay

## 2018-06-15 DIAGNOSIS — E1122 Type 2 diabetes mellitus with diabetic chronic kidney disease: Secondary | ICD-10-CM | POA: Insufficient documentation

## 2018-06-15 DIAGNOSIS — Z79899 Other long term (current) drug therapy: Secondary | ICD-10-CM | POA: Insufficient documentation

## 2018-06-15 DIAGNOSIS — Z7982 Long term (current) use of aspirin: Secondary | ICD-10-CM | POA: Insufficient documentation

## 2018-06-15 DIAGNOSIS — N184 Chronic kidney disease, stage 4 (severe): Secondary | ICD-10-CM | POA: Insufficient documentation

## 2018-06-15 DIAGNOSIS — I129 Hypertensive chronic kidney disease with stage 1 through stage 4 chronic kidney disease, or unspecified chronic kidney disease: Secondary | ICD-10-CM | POA: Diagnosis not present

## 2018-06-15 DIAGNOSIS — R197 Diarrhea, unspecified: Secondary | ICD-10-CM | POA: Insufficient documentation

## 2018-06-15 DIAGNOSIS — R531 Weakness: Secondary | ICD-10-CM | POA: Diagnosis not present

## 2018-06-15 DIAGNOSIS — M6281 Muscle weakness (generalized): Secondary | ICD-10-CM | POA: Diagnosis not present

## 2018-06-15 LAB — COMPREHENSIVE METABOLIC PANEL
ALBUMIN: 3.3 g/dL — AB (ref 3.5–5.0)
ALT: 19 U/L (ref 0–44)
AST: 28 U/L (ref 15–41)
Alkaline Phosphatase: 67 U/L (ref 38–126)
Anion gap: 10 (ref 5–15)
BILIRUBIN TOTAL: 0.7 mg/dL (ref 0.3–1.2)
BUN: 65 mg/dL — AB (ref 8–23)
CALCIUM: 8.2 mg/dL — AB (ref 8.9–10.3)
CO2: 20 mmol/L — ABNORMAL LOW (ref 22–32)
CREATININE: 1.96 mg/dL — AB (ref 0.44–1.00)
Chloride: 104 mmol/L (ref 98–111)
GFR calc Af Amer: 27 mL/min — ABNORMAL LOW (ref >60)
GFR calc non Af Amer: 23 mL/min — ABNORMAL LOW (ref >60)
GLUCOSE: 114 mg/dL — AB (ref 70–99)
Potassium: 4.6 mmol/L (ref 3.5–5.1)
SODIUM: 134 mmol/L — AB (ref 135–145)
TOTAL PROTEIN: 7.2 g/dL (ref 6.5–8.1)

## 2018-06-15 LAB — CBC WITH DIFFERENTIAL/PLATELET
BASOS PCT: 0 %
Basophils Absolute: 0 10*3/uL (ref 0.0–0.1)
Eosinophils Absolute: 0 10*3/uL (ref 0.0–0.7)
Eosinophils Relative: 1 %
HEMATOCRIT: 30.3 % — AB (ref 36.0–46.0)
HEMOGLOBIN: 10.5 g/dL — AB (ref 12.0–15.0)
LYMPHS ABS: 1.3 10*3/uL (ref 0.7–4.0)
Lymphocytes Relative: 20 %
MCH: 31.9 pg (ref 26.0–34.0)
MCHC: 34.7 g/dL (ref 30.0–36.0)
MCV: 92.1 fL (ref 78.0–100.0)
MONOS PCT: 7 %
Monocytes Absolute: 0.4 10*3/uL (ref 0.1–1.0)
NEUTROS ABS: 4.6 10*3/uL (ref 1.7–7.7)
NEUTROS PCT: 72 %
Platelets: 255 10*3/uL (ref 150–400)
RBC: 3.29 MIL/uL — AB (ref 3.87–5.11)
RDW: 14.6 % (ref 11.5–15.5)
WBC: 6.4 10*3/uL (ref 4.0–10.5)

## 2018-06-15 LAB — URINALYSIS, ROUTINE W REFLEX MICROSCOPIC
Bilirubin Urine: NEGATIVE
Glucose, UA: NEGATIVE mg/dL
Ketones, ur: NEGATIVE mg/dL
Leukocytes, UA: NEGATIVE
NITRITE: NEGATIVE
PH: 5.5 (ref 5.0–8.0)
Protein, ur: NEGATIVE mg/dL

## 2018-06-15 LAB — URINALYSIS, MICROSCOPIC (REFLEX)

## 2018-06-15 LAB — LIPASE, BLOOD: Lipase: 77 U/L — ABNORMAL HIGH (ref 11–51)

## 2018-06-15 MED ORDER — SODIUM CHLORIDE 0.9 % IV BOLUS
1000.0000 mL | Freq: Once | INTRAVENOUS | Status: AC
  Administered 2018-06-15: 1000 mL via INTRAVENOUS

## 2018-06-15 NOTE — ED Provider Notes (Signed)
[facility] EMERGENCY DEPARTMENT Provider Note   CSN: [id number] Arrival date & time: 06/15/18  1275     History   Chief Complaint Chief Complaint  Patient presents with  . Diarrhea    HPI Samantha Mckenzie is a 82 y.o. female past medical history of anemia, depression, GERD, hyperlipidemia, hypertension who presents for evaluation of diarrhea and generalized weakness that is been ongoing for the last 2 weeks.  She states that she has had 2-3 episodes of loose stool for the last 2 weeks.  No blood noted in stool, no black tarry stools.  She states sometimes her stools more of a yellow rusty color.  She states that she has had some nausea and decreased appetite and feels like she does not want to eat.  She denies any vomiting or abdominal pain but states she feels like "her stomach feels like it swelling."  She was seen by her PCP several days ago and had lab work done at that time.  She states that they called her on the 27th and told her she had an infection and prescribed her doxycycline which she has been taking.  Patient does not know what the infection was.  Patient reports no other antibiotic use or any travel outside of the country.  Patient reports that the symptoms have caused her to be generalized weak, fatigued and rundown.  She states she feels like she has no energy.  She denies any focal weakness or numbness.  Patient reports that yesterday, she had an episode where she just felt so weak that she felt like she could not even finish getting dressed and just laid on the bed.  Patient states she has felt some subjective fevers but has not measured temperature.  Patient denies any vision changes, headache, chest pain, difficulty breathing, dysuria, hematuria, abdominal pain.  The history is provided by the patient.    Past Medical History:  Diagnosis Date  . Allergic rhinitis   . Anemia   . Breast CA ([place])    surgery, chemo, XRT; had peripheral neuropathy (imbalance at  times) after chemo  . Cellulitis    Left arm, recurrent   . Chronic renal disease, stage IV (HCC)    Dr. Moshe Cipro  . Depression   . Fatigue   . GERD (gastroesophageal reflux disease)    gastritis, EGD 02/2007  . Gout   . Hyperlipidemia   . Hypertension   . Macular degeneration 2011  . Normal cardiac stress test 12/2014  . Osteoarthritis   . Renal insufficiency     Patient Active Problem List   Diagnosis Date Noted  . PCP NOTES >>> 07/11/2015  . Myalgia and myositis 03/08/2014  . Dizziness and giddiness 05/06/2013  . Leg pain, RLS? neuropathy? 06/21/2012  . Cellulitis LEFT arm recurrent 12/08/2011  . Annual physical exam 06/22/2011  . Depression   . NECK PAIN 12/28/2010  . Fatigue 12/28/2010  . VITAMIN B12 DEFICIENCY 04/01/2008  . GERD 12/19/2007  . NEOP, MALIGNANT, FEMALE BREAST NOS 07/07/2007  . RENAL INSUFFICIENCY 07/07/2007  . Osteoarthritis 07/07/2007  . DM II (diabetes mellitus, type II), controlled ([place]) 02/28/2007  . Hyperlipidemia 02/28/2007  . GOUT 02/28/2007  . ANEMIA-NOS 02/28/2007  . Essential hypertension 02/28/2007    Past Surgical History:  Procedure Laterality Date  . cataracts bilaterally  1 -2012  . LUNG BIOPSY  1999   neg  . MASTECTOMY Left 1999   and lymphnodes      OB History  None      Home Medications    Prior to Admission medications   Medication Sig Start Date End Date Taking? Authorizing Provider  allopurinol (ZYLOPRIM) 100 MG tablet Take 1 tablet (100 mg total) by mouth daily. 05/22/18   Colon Branch, MD  amLODipine (NORVASC) 10 MG tablet Take 1 tablet (10 mg total) by mouth daily. 06/13/18   Colon Branch, MD  aspirin 81 MG tablet Take 81 mg by mouth daily.      [provider]  atorvastatin (LIPITOR) 20 MG tablet Take 1 tablet (20 mg total) by mouth daily. 05/22/18   Colon Branch, MD  Cholecalciferol (VITAMIN D3) 1000 UNITS CAPS Take 1 capsule by mouth daily.     [provider]  doxycycline (VIBRA-TABS) 100 MG  tablet Take 1 tablet (100 mg total) by mouth 2 (two) times daily. Can give caps or generic 06/05/18   Saguier, Percell Miller, PA-C  esomeprazole (NEXIUM) 40 MG capsule Take 40 mg by mouth daily at 12 noon.    [provider]  gabapentin (NEURONTIN) 100 MG capsule Take 1 capsule (100 mg total) by mouth 3 (three) times daily. 02/22/18   Saguier, Percell Miller, PA-C  metoprolol tartrate (LOPRESSOR) 25 MG tablet Take 1 tablet (25 mg total) by mouth daily. 05/22/18   Colon Branch, MD  Multiple Vitamins-Minerals (CENTRUM SILVER PO) Take 1 tablet by mouth daily.     [provider]  sertraline (ZOLOFT) 25 MG tablet Take 2 tablets (50 mg total) by mouth daily. 06/13/18   Colon Branch, MD  valsartan (DIOVAN) 160 MG tablet Take 1 tablet (160 mg total) by mouth daily. 06/13/18   Colon Branch, MD  vitamin B-12 (CYANOCOBALAMIN) 1000 MCG tablet Take 1,000 mcg by mouth daily.      [provider]    Family History Family History  Problem Relation Age of Onset  . Breast cancer Other        ? of   . Heart attack Father 36  . Diabetes Sister        2  . Hypertension Sister   . Colon cancer Neg Hx   . Stomach cancer Neg Hx     Social History Social History   Tobacco Use  . Smoking status: Never Smoker  . Smokeless tobacco: Never Used  Substance Use Topics  . Alcohol use: Yes    Alcohol/week: 0.0 standard drinks    Comment: rarely  . Drug use: No     Allergies   Codeine; Morphine; Oxycodone-acetaminophen; Sulfonamide derivatives; and Tramadol hcl   Review of Systems Review of Systems  Constitutional: Positive for appetite change and fatigue. Negative for fever.  Eyes: Negative for visual disturbance.  Respiratory: Negative for cough and shortness of breath.   Cardiovascular: Negative for chest pain.  Gastrointestinal: Positive for diarrhea and nausea. Negative for abdominal pain and vomiting.  Genitourinary: Negative for dysuria and hematuria.  Neurological: Positive for weakness  (generalized). Negative for numbness and headaches.  All other systems reviewed and are negative.    Physical Exam Updated Vital Signs BP (!) 142/79   Pulse 61   Temp 98.3 F (36.8 C) (Oral)   Resp 16   Ht 5' 5"  (1.651 m)   Wt 59 kg   SpO2 99%   BMI 21.63 kg/m   Physical Exam  Constitutional: She is oriented to person, place, and time. She appears well-developed and well-nourished.  Appears fatigued but no acute distress   HENT:  Head: Normocephalic and atraumatic.  Mouth/Throat: Oropharynx is clear and moist and mucous membranes are normal.  Eyes: Pupils are equal, round, and reactive to light. Conjunctivae, EOM and lids are normal.  Neck: Full passive range of motion without pain.  Cardiovascular: Normal rate, regular rhythm, normal heart sounds and normal pulses. Exam reveals no gallop and no friction rub.  No murmur heard. Pulmonary/Chest: Effort normal and breath sounds normal.  Lungs clear to auscultation bilaterally.  Symmetric chest rise.  No wheezing, rales, rhonchi.  Abdominal: Soft. Normal appearance. There is no tenderness. There is no rigidity and no guarding.  Abdomen is soft, non-distended, non-tender. No rigidity, No guarding. No peritoneal signs.  Musculoskeletal: Normal range of motion.  Neurological: She is alert and oriented to person, place, and time.  Cranial nerves III-XII intact Follows commands, Moves all extremities  5/5 strength to BUE and BLE  Sensation intact throughout all major nerve distributions Normal coordination No slurred speech. No facial droop.   Skin: Skin is warm and dry. Capillary refill takes less than 2 seconds.  Psychiatric: She has a normal mood and affect. Her speech is normal.  Nursing note and vitals reviewed.    ED Treatments / Results  Labs (all labs ordered are listed, but only abnormal results are displayed) Labs Reviewed  CBC WITH DIFFERENTIAL/PLATELET - Abnormal; Notable for the following components:      Result  Value   RBC 3.29 (*)    Hemoglobin 10.5 (*)    HCT 30.3 (*)    All other components within normal limits  URINALYSIS, ROUTINE W REFLEX MICROSCOPIC - Abnormal; Notable for the following components:   APPearance HAZY (*)    Specific Gravity, Urine <1.005 (*)    Hgb urine dipstick TRACE (*)    All other components within normal limits  COMPREHENSIVE METABOLIC PANEL - Abnormal; Notable for the following components:   Sodium 134 (*)    CO2 20 (*)    Glucose, Bld 114 (*)    BUN 65 (*)    Creatinine, Ser 1.96 (*)    Calcium 8.2 (*)    Albumin 3.3 (*)    GFR calc non Af Amer 23 (*)    GFR calc Af Amer 27 (*)    All other components within normal limits  LIPASE, BLOOD - Abnormal; Notable for the following components:   Lipase 77 (*)    All other components within normal limits  URINALYSIS, MICROSCOPIC (REFLEX) - Abnormal; Notable for the following components:   Bacteria, UA RARE (*)    All other components within normal limits    EKG None  Radiology No results found.  Procedures Procedures (including critical care time)  Medications Ordered in ED Medications  sodium chloride 0.9 % bolus 1,000 mL (0 mLs Intravenous Stopped 06/15/18 1214)     Initial Impression / Assessment and Plan / ED Course  I have reviewed the triage vital signs and the nursing notes.  Pertinent labs & imaging results that were available during my care of the patient were reviewed by me and considered in my medical decision making (see chart for details).     82 year old female with past medical history of depression, GERD, hypertension, hyperlipidemia presents for evaluation of 2 weeks of generalized weakness and diarrhea.  Seen by PCP last week and was diagnosed with "infection" and was started on doxycycline.  No blood in stools, vomiting, abdominal pain.  No focal weakness. Patient is afebrile, non-toxic appearing, sitting comfortably on examination table. Vital  signs reviewed and stable. No neuro  deficits noted on exam. Abdomen exam is benign. Consider viral GI process vs UTI. History/physical exam is not concerning for CVA. Plan to check basic labs, UA.   UA shows trace hgb. No infectious signs. Lipase is slightly elevated at 77. CMP shows bicarb 20. BUN is slightly elevated at 65. Baseline is 60. Creatinine is 1.96. Patient's baseline is around 2.3. Improved from previous. CBC shows Hgb. Is 10.5. This is consistent with with patient's previous.  Patient is hemodynamically stable.  She is not having any abdominal pain or nausea/vomiting.  No indication for further C abdomen pelvis at this time.  Discussed results with patient.  Review of patient records show that she had been placed on doxycycline because of some left upper extremity swelling.  Patient has chronic lymphedema of left upper extremity secondary to previous history of breast cancer.  No signs of surrounding warmth, erythema.  Will DC doxycycline.  Encourage at home supportive care measures.  Encourage follow-up with patient's primary care doctor. Patient had ample opportunity for questions and discussion. All patient's questions were answered with full understanding. Strict return precautions discussed. Patient expresses understanding and agreement to plan.    Final Clinical Impressions(s) / ED Diagnoses   Final diagnoses:  Diarrhea, unspecified type  Generalized weakness    ED Discharge Orders    None       Volanda Napoleon, PA-C 06/15/18 1501    Quintella Reichert, MD 06/17/18 [phone]

## 2018-06-15 NOTE — ED Notes (Signed)
Pt aware that we need urine specimen. Pt states that she has problems d/t "kidney issues".

## 2018-06-15 NOTE — ED Triage Notes (Signed)
Pt c/o diarrhea and nause and weakness for over 2 weeks. She states that she is unable to eat.  She went to dr 2 days ago and is not improving and is feeling weaker.

## 2018-06-15 NOTE — Discharge Instructions (Signed)
Follow the BRAT (Bananas/Bread, Rice, Applesauce, Toast) to help with diarrhea.   Make sure you are drinking plenty of fluids and staying hydrated.   Follow-up with your primary care doctor.   Stop taking the doxycycline.   Return to the Emergency Department for any worsening weakness, chest pain, difficulty breathing, abdominal pain, persistent vomiting or any other worsening or concerning symptoms.

## 2018-06-16 ENCOUNTER — Other Ambulatory Visit: Payer: Self-pay

## 2018-06-16 ENCOUNTER — Emergency Department (HOSPITAL_COMMUNITY): Payer: Medicare Other

## 2018-06-16 ENCOUNTER — Encounter (HOSPITAL_COMMUNITY): Payer: Self-pay | Admitting: *Deleted

## 2018-06-16 ENCOUNTER — Emergency Department (HOSPITAL_COMMUNITY)
Admission: EM | Admit: 2018-06-16 | Discharge: 2018-06-16 | Disposition: A | Discharge status: Home / Self Care | Payer: Medicare Other | Attending: Emergency Medicine | Admitting: Emergency Medicine

## 2018-06-16 DIAGNOSIS — I10 Essential (primary) hypertension: Secondary | ICD-10-CM | POA: Diagnosis not present

## 2018-06-16 DIAGNOSIS — R0902 Hypoxemia: Secondary | ICD-10-CM | POA: Diagnosis not present

## 2018-06-16 DIAGNOSIS — N184 Chronic kidney disease, stage 4 (severe): Secondary | ICD-10-CM | POA: Diagnosis not present

## 2018-06-16 DIAGNOSIS — E119 Type 2 diabetes mellitus without complications: Secondary | ICD-10-CM | POA: Insufficient documentation

## 2018-06-16 DIAGNOSIS — R197 Diarrhea, unspecified: Secondary | ICD-10-CM | POA: Diagnosis not present

## 2018-06-16 DIAGNOSIS — I129 Hypertensive chronic kidney disease with stage 1 through stage 4 chronic kidney disease, or unspecified chronic kidney disease: Secondary | ICD-10-CM | POA: Diagnosis not present

## 2018-06-16 DIAGNOSIS — R1033 Periumbilical pain: Secondary | ICD-10-CM | POA: Insufficient documentation

## 2018-06-16 DIAGNOSIS — R531 Weakness: Secondary | ICD-10-CM

## 2018-06-16 DIAGNOSIS — Z79899 Other long term (current) drug therapy: Secondary | ICD-10-CM | POA: Insufficient documentation

## 2018-06-16 DIAGNOSIS — R42 Dizziness and giddiness: Secondary | ICD-10-CM | POA: Diagnosis not present

## 2018-06-16 DIAGNOSIS — K5730 Diverticulosis of large intestine without perforation or abscess without bleeding: Secondary | ICD-10-CM | POA: Diagnosis not present

## 2018-06-16 DIAGNOSIS — Z7982 Long term (current) use of aspirin: Secondary | ICD-10-CM | POA: Insufficient documentation

## 2018-06-16 DIAGNOSIS — R110 Nausea: Secondary | ICD-10-CM | POA: Diagnosis not present

## 2018-06-16 DIAGNOSIS — Z853 Personal history of malignant neoplasm of breast: Secondary | ICD-10-CM | POA: Insufficient documentation

## 2018-06-16 LAB — COMPREHENSIVE METABOLIC PANEL
ALBUMIN: 3.2 g/dL — AB (ref 3.5–5.0)
ALT: 20 U/L (ref 0–44)
AST: 30 U/L (ref 15–41)
Alkaline Phosphatase: 72 U/L (ref 38–126)
Anion gap: 7 (ref 5–15)
BUN: 54 mg/dL — AB (ref 8–23)
CHLORIDE: 104 mmol/L (ref 98–111)
CO2: 22 mmol/L (ref 22–32)
Calcium: 8.4 mg/dL — ABNORMAL LOW (ref 8.9–10.3)
Creatinine, Ser: 1.94 mg/dL — ABNORMAL HIGH (ref 0.44–1.00)
GFR calc Af Amer: 27 mL/min — ABNORMAL LOW (ref >60)
GFR calc non Af Amer: 23 mL/min — ABNORMAL LOW (ref >60)
GLUCOSE: 116 mg/dL — AB (ref 70–99)
POTASSIUM: 4.4 mmol/L (ref 3.5–5.1)
SODIUM: 133 mmol/L — AB (ref 135–145)
Total Bilirubin: 0.9 mg/dL (ref 0.3–1.2)
Total Protein: 7.1 g/dL (ref 6.5–8.1)

## 2018-06-16 LAB — CBC WITH DIFFERENTIAL/PLATELET
ABS IMMATURE GRANULOCYTES: 0 10*3/uL (ref 0.0–0.1)
Basophils Absolute: 0 10*3/uL (ref 0.0–0.1)
Basophils Relative: 0 %
Eosinophils Absolute: 0 10*3/uL (ref 0.0–0.7)
Eosinophils Relative: 0 %
HEMATOCRIT: 32.6 % — AB (ref 36.0–46.0)
HEMOGLOBIN: 10.7 g/dL — AB (ref 12.0–15.0)
Immature Granulocytes: 0 %
LYMPHS ABS: 1.6 10*3/uL (ref 0.7–4.0)
LYMPHS PCT: 20 %
MCH: 31.2 pg (ref 26.0–34.0)
MCHC: 32.8 g/dL (ref 30.0–36.0)
MCV: 95 fL (ref 78.0–100.0)
MONO ABS: 0.5 10*3/uL (ref 0.1–1.0)
Monocytes Relative: 6 %
NEUTROS ABS: 5.8 10*3/uL (ref 1.7–7.7)
Neutrophils Relative %: 74 %
Platelets: 235 10*3/uL (ref 150–400)
RBC: 3.43 MIL/uL — ABNORMAL LOW (ref 3.87–5.11)
RDW: 14.8 % (ref 11.5–15.5)
WBC: 7.9 10*3/uL (ref 4.0–10.5)

## 2018-06-16 LAB — LIPASE, BLOOD: LIPASE: 80 U/L — AB (ref 11–51)

## 2018-06-16 MED ORDER — IOPAMIDOL (ISOVUE-300) INJECTION 61%
INTRAVENOUS | Status: AC
  Filled 2018-06-16: qty 30

## 2018-06-16 MED ORDER — IOPAMIDOL (ISOVUE-300) INJECTION 61%: 15.0000 mL | INTRAVENOUS | Status: AC

## 2018-06-16 MED ORDER — SODIUM CHLORIDE 0.9 % IV BOLUS
1000.0000 mL | Freq: Once | INTRAVENOUS | Status: AC
  Administered 2018-06-16: 1000 mL via INTRAVENOUS

## 2018-06-16 MED ORDER — ONDANSETRON 4 MG PO TBDP
4.0000 mg | ORAL_TABLET | Freq: Once | ORAL | Status: AC
  Administered 2018-06-16: 4 mg via ORAL
  Filled 2018-06-16: qty 1

## 2018-06-16 MED ORDER — ONDANSETRON 4 MG PO TBDP: 4.0000 mg | ORAL_TABLET | Freq: Three times a day (TID) | ORAL | 0 refills | Status: DC | PRN

## 2018-06-16 MED ORDER — DIPHENOXYLATE-ATROPINE 2.5-0.025 MG PO TABS
1.0000 | ORAL_TABLET | Freq: Once | ORAL | Status: AC
  Administered 2018-06-16: 1 via ORAL
  Filled 2018-06-16: qty 1

## 2018-06-16 MED ORDER — LOPERAMIDE HCL 2 MG PO CAPS: 2.0000 mg | ORAL_CAPSULE | Freq: Four times a day (QID) | ORAL | 0 refills | Status: DC | PRN

## 2018-06-16 NOTE — ED Notes (Signed)
Pt given sandwich but states she hasn't had an appetite.  Pt able to keep fluids down.

## 2018-06-16 NOTE — ED Notes (Signed)
Pt stable, ambulatory, states understanding of discharge instructions, family coming to transport

## 2018-06-16 NOTE — ED Notes (Signed)
Patient was asked to ambulated and patient responded that she was too weak to do so at this time. RN notified.

## 2018-06-16 NOTE — ED Notes (Signed)
Patient transported to CT 

## 2018-06-16 NOTE — ED Triage Notes (Signed)
Patient presents to ed via [facility] states she has been having dizziness and diarrhea for several weeks. Was seen at [facility] yest , however she doesn't feel any better today . States her diarrhea is yellow in color just feels very weak.

## 2018-06-16 NOTE — ED Provider Notes (Signed)
[facility] EMERGENCY DEPARTMENT Provider Note   CSN: [id number] Arrival date & time: 06/16/18  0920     History   Chief Complaint No chief complaint on file.   HPI Samantha Mckenzie is a 82 y.o. female.  The history is provided by the patient. No language interpreter was used.    Samantha Mckenzie is a 82 y.o. female who presents to the Emergency Department complaining of weakness. She presents to the emergency department by EMS for evaluation of weakness. She has experienced 2 to 3 weeks of diarrhea, yellow in color. She states that every time she eats or drinks she has to have a bowel movement. She feels profound progressive generalized weakness with difficulty with ambulation. She has not passed out but feels as if she might. She has been seen by her PCP and in the emergency department for similar symptoms. She endorses some left lower quadrant abdominal discomfort and rumbling sensation that began yesterday. No fevers, vomiting, dysuria.  Past Medical History:  Diagnosis Date  . Allergic rhinitis   . Anemia   . Breast CA ([place])    surgery, chemo, XRT; had peripheral neuropathy (imbalance at times) after chemo  . Cellulitis    Left arm, recurrent   . Chronic renal disease, stage IV (HCC)    Dr. Moshe Cipro  . Depression   . Fatigue   . GERD (gastroesophageal reflux disease)    gastritis, EGD 02/2007  . Gout   . Hyperlipidemia   . Hypertension   . Macular degeneration 2011  . Normal cardiac stress test 12/2014  . Osteoarthritis   . Renal insufficiency     Patient Active Problem List   Diagnosis Date Noted  . PCP NOTES >>> 07/11/2015  . Myalgia and myositis 03/08/2014  . Dizziness and giddiness 05/06/2013  . Leg pain, RLS? neuropathy? 06/21/2012  . Cellulitis LEFT arm recurrent 12/08/2011  . Annual physical exam 06/22/2011  . Depression   . NECK PAIN 12/28/2010  . Fatigue 12/28/2010  . VITAMIN B12 DEFICIENCY 04/01/2008  . GERD 12/19/2007  . NEOP,  MALIGNANT, FEMALE BREAST NOS 07/07/2007  . RENAL INSUFFICIENCY 07/07/2007  . Osteoarthritis 07/07/2007  . DM II (diabetes mellitus, type II), controlled ([place]) 02/28/2007  . Hyperlipidemia 02/28/2007  . GOUT 02/28/2007  . ANEMIA-NOS 02/28/2007  . Essential hypertension 02/28/2007    Past Surgical History:  Procedure Laterality Date  . cataracts bilaterally  1 -2012  . LUNG BIOPSY  1999   neg  . MASTECTOMY Left 1999   and lymphnodes      OB History   None      Home Medications    Prior to Admission medications   Medication Sig Start Date End Date Taking? Authorizing Provider  allopurinol (ZYLOPRIM) 100 MG tablet Take 1 tablet (100 mg total) by mouth daily. 05/22/18   Colon Branch, MD  amLODipine (NORVASC) 10 MG tablet Take 1 tablet (10 mg total) by mouth daily. 06/13/18   Colon Branch, MD  aspirin 81 MG tablet Take 81 mg by mouth daily.      [provider]  atorvastatin (LIPITOR) 20 MG tablet Take 1 tablet (20 mg total) by mouth daily. 05/22/18   Colon Branch, MD  Cholecalciferol (VITAMIN D3) 1000 UNITS CAPS Take 1 capsule by mouth daily.     [provider]  doxycycline (VIBRA-TABS) 100 MG tablet Take 1 tablet (100 mg total) by mouth 2 (two) times daily. Can give caps or generic 06/05/18  Saguier, Percell Miller, PA-C  esomeprazole (NEXIUM) 40 MG capsule Take 40 mg by mouth daily at 12 noon.    [provider]  gabapentin (NEURONTIN) 100 MG capsule Take 1 capsule (100 mg total) by mouth 3 (three) times daily. 02/22/18   Saguier, Percell Miller, PA-C  loperamide (IMODIUM) 2 MG capsule Take 1 capsule (2 mg total) by mouth 4 (four) times daily as needed for diarrhea or loose stools. 06/16/18   Quintella Reichert, MD  metoprolol tartrate (LOPRESSOR) 25 MG tablet Take 1 tablet (25 mg total) by mouth daily. 05/22/18   Colon Branch, MD  Multiple Vitamins-Minerals (CENTRUM SILVER PO) Take 1 tablet by mouth daily.     [provider]  ondansetron (ZOFRAN ODT) 4 MG disintegrating  tablet Take 1 tablet (4 mg total) by mouth every 8 (eight) hours as needed for nausea or vomiting. 06/16/18   Quintella Reichert, MD  sertraline (ZOLOFT) 25 MG tablet Take 2 tablets (50 mg total) by mouth daily. 06/13/18   Colon Branch, MD  valsartan (DIOVAN) 160 MG tablet Take 1 tablet (160 mg total) by mouth daily. 06/13/18   Colon Branch, MD  vitamin B-12 (CYANOCOBALAMIN) 1000 MCG tablet Take 1,000 mcg by mouth daily.      [provider]    Family History Family History  Problem Relation Age of Onset  . Breast cancer Other        ? of   . Heart attack Father 53  . Diabetes Sister        2  . Hypertension Sister   . Colon cancer Neg Hx   . Stomach cancer Neg Hx     Social History Social History   Tobacco Use  . Smoking status: Never Smoker  . Smokeless tobacco: Never Used  Substance Use Topics  . Alcohol use: Yes    Alcohol/week: 0.0 standard drinks    Comment: rarely  . Drug use: No     Allergies   Codeine; Morphine; Oxycodone-acetaminophen; Sulfonamide derivatives; and Tramadol hcl   Review of Systems Review of Systems  All other systems reviewed and are negative.    Physical Exam Updated Vital Signs BP (!) 163/62   Pulse 64   Temp 98.5 F (36.9 C) (Oral)   Resp 18   Ht 5' 5"  (1.651 m)   Wt 58.9 kg   SpO2 100%   BMI 21.61 kg/m   Physical Exam  Constitutional: She is oriented to person, place, and time. She appears well-developed and well-nourished.  HENT:  Head: Normocephalic and atraumatic.  Cardiovascular: Normal rate and regular rhythm.  No murmur heard. Pulmonary/Chest: Effort normal and breath sounds normal. No respiratory distress.  Abdominal: Soft. There is no rebound and no guarding.  Mild LLQ tenderness  Musculoskeletal: She exhibits no tenderness.  Edema to LUE without erythema  Neurological: She is alert and oriented to person, place, and time.  Skin: Skin is warm and dry.  Psychiatric: She has a normal mood and affect. Her behavior  is normal.  Nursing note and vitals reviewed.    ED Treatments / Results  Labs (all labs ordered are listed, but only abnormal results are displayed) Labs Reviewed  COMPREHENSIVE METABOLIC PANEL - Abnormal; Notable for the following components:      Result Value   Sodium 133 (*)    Glucose, Bld 116 (*)    BUN 54 (*)    Creatinine, Ser 1.94 (*)    Calcium 8.4 (*)    Albumin 3.2 (*)  GFR calc non Af Amer 23 (*)    GFR calc Af Amer 27 (*)    All other components within normal limits  CBC WITH DIFFERENTIAL/PLATELET - Abnormal; Notable for the following components:   RBC 3.43 (*)    Hemoglobin 10.7 (*)    HCT 32.6 (*)    All other components within normal limits  LIPASE, BLOOD - Abnormal; Notable for the following components:   Lipase 80 (*)    All other components within normal limits    EKG None  Radiology Ct Abdomen Pelvis Wo Contrast  Result Date: 06/16/2018 CLINICAL DATA:  Periumbilical abdominal pain, nausea, unable to keep anything down for 1 month, question diverticulitis, history hypertension, type II diabetes mellitus EXAM: CT ABDOMEN AND PELVIS WITHOUT CONTRAST TECHNIQUE: Multidetector CT imaging of the abdomen and pelvis was performed following the standard protocol without IV contrast. Sagittal and coronal MPR images reconstructed from axial data set. COMPARISON:  12/29/2014 FINDINGS: Lower chest: Dependent atelectasis RIGHT lower lobe Hepatobiliary: Gallbladder and liver normal appearance Pancreas: Atrophic pancreas without mass Spleen: Calcified granulomata in the spleen.  No focal mass lesion. Adrenals/Urinary Tract: Adrenal glands normal appearance. Atrophic kidneys bilaterally. BILATERAL prominent extrarenal pelves. Exophytic low-attenuation lesion lateral RIGHT kidney 2.2 x 2.1 cm consistent with a cyst. No additional renal masses. Decompressed ureters without calcification. Bladder well distended and unremarkable. Stomach/Bowel: Sigmoid diverticulosis without  evidence of diverticulitis. Normal appendix. Stomach and bowel loops otherwise normal appearance. Vascular/Lymphatic: Atherosclerotic calcifications aorta and iliac arteries without aneurysm. No adenopathy. Reproductive: Atrophic uterus and adnexa Other: No free air or free fluid. No inflammatory process. No hernia. Musculoskeletal: Osseous demineralization. Grade 1 anterolisthesis L4-L5 with mildly bulging disc. IMPRESSION: Distal colonic diverticulosis without evidence of diverticulitis. No acute intra-abdominal or intrapelvic abnormalities. Aortic Atherosclerosis (ICD10-I70.0). Electronically Signed   By: Lavonia Dana M.D.   On: 06/16/2018 13:50    Procedures Procedures (including critical care time)  Medications Ordered in ED Medications  iopamidol (ISOVUE-300) 61 % injection 15 mL ( Oral Canceled Entry 06/16/18 1258)  sodium chloride 0.9 % bolus 1,000 mL (0 mLs Intravenous Stopped 06/16/18 1102)  diphenoxylate-atropine (LOMOTIL) 2.5-0.025 MG per tablet 1 tablet (1 tablet Oral Given 06/16/18 1643)  ondansetron (ZOFRAN-ODT) disintegrating tablet 4 mg (4 mg Oral Given 06/16/18 1643)     Initial Impression / Assessment and Plan / ED Course  I have reviewed the triage vital signs and the nursing notes.  Pertinent labs & imaging results that were available during my care of the patient were reviewed by me and considered in my medical decision making (see chart for details).     Patient with history of CKD here for evaluation of progressive diarrhea, generalized weakness. She is non-toxic appearing on examination. She does have minimal left lower quadrant tenderness on exam. Labs are at her baseline with stable renal function. CT abdomen is negative for acute intra-abdominal abnormality, no acute diverticulitis. Please note that well patient complaint states hypotension she did not have any hypotension in the department. She is well perfused on examination and tolerating oral's without difficulty.  Reviewed records in epic. She did have stool studies performed that were negative for C diff. Patient has significant symptoms secondary to her diarrhea. Will treat with loperamide, Zofran for nausea. Discussed importance of PCP follow-up as well as return precautions.  Final Clinical Impressions(s) / ED Diagnoses   Final diagnoses:  Diarrhea, unspecified type  Weakness    ED Discharge Orders  Ordered    ondansetron (ZOFRAN ODT) 4 MG disintegrating tablet  Every 8 hours PRN     06/16/18 1600    loperamide (IMODIUM) 2 MG capsule  4 times daily PRN     06/16/18 1600           Quintella Reichert, MD 06/16/18 1649

## 2018-06-17 LAB — STOOL CULTURE
MICRO NUMBER: 91055808
MICRO NUMBER:: 91055804
MICRO NUMBER:: 91055806
SHIGA RESULT:: NOT DETECTED
SPECIMEN QUALITY:: ADEQUATE
SPECIMEN QUALITY:: ADEQUATE
SPECIMEN QUALITY:: ADEQUATE

## 2018-06-17 LAB — FECAL LACTOFERRIN, QUANT
FECAL LACTOFERRIN: NEGATIVE
MICRO NUMBER: 91055807
SPECIMEN QUALITY:: ADEQUATE

## 2018-06-17 LAB — C. DIFFICILE GDH AND TOXIN A/B
GDH ANTIGEN: NOT DETECTED
MICRO NUMBER: 91055805
SPECIMEN QUALITY: ADEQUATE
TOXIN A AND B: NOT DETECTED

## 2018-06-19 ENCOUNTER — Telehealth: Payer: Self-pay | Admitting: Internal Medicine

## 2018-06-19 NOTE — Telephone Encounter (Signed)
Patient has been scheduled for follow up visit tomorrow.

## 2018-06-19 NOTE — Telephone Encounter (Signed)
Please call the patient, went to the ER twice recently, schedule a follow-up for this week to discuss further steps.  She is having diarrhea and generalized weakness.

## 2018-06-19 NOTE — Telephone Encounter (Signed)
thx

## 2018-06-20 ENCOUNTER — Encounter (HOSPITAL_BASED_OUTPATIENT_CLINIC_OR_DEPARTMENT_OTHER): Payer: Self-pay | Admitting: Emergency Medicine

## 2018-06-20 ENCOUNTER — Observation Stay (HOSPITAL_BASED_OUTPATIENT_CLINIC_OR_DEPARTMENT_OTHER)
Admission: EM | Admit: 2018-06-20 | Discharge: 2018-06-21 | Disposition: A | Discharge status: Home / Self Care | Payer: Medicare Other | Attending: Family Medicine | Admitting: Family Medicine

## 2018-06-20 ENCOUNTER — Emergency Department (HOSPITAL_BASED_OUTPATIENT_CLINIC_OR_DEPARTMENT_OTHER): Payer: Medicare Other

## 2018-06-20 ENCOUNTER — Ambulatory Visit (INDEPENDENT_AMBULATORY_CARE_PROVIDER_SITE_OTHER): Payer: Medicare Other | Admitting: Internal Medicine

## 2018-06-20 ENCOUNTER — Other Ambulatory Visit: Payer: Self-pay

## 2018-06-20 ENCOUNTER — Encounter: Payer: Self-pay | Admitting: Internal Medicine

## 2018-06-20 ENCOUNTER — Telehealth: Payer: Self-pay | Admitting: *Deleted

## 2018-06-20 VITALS — BP 126/58 | HR 86 | Temp 98.0°F | Resp 14 | Ht 62.0 in | Wt 131.4 lb

## 2018-06-20 DIAGNOSIS — E1140 Type 2 diabetes mellitus with diabetic neuropathy, unspecified: Secondary | ICD-10-CM | POA: Insufficient documentation

## 2018-06-20 DIAGNOSIS — M1990 Unspecified osteoarthritis, unspecified site: Secondary | ICD-10-CM | POA: Insufficient documentation

## 2018-06-20 DIAGNOSIS — E785 Hyperlipidemia, unspecified: Secondary | ICD-10-CM | POA: Diagnosis not present

## 2018-06-20 DIAGNOSIS — Z853 Personal history of malignant neoplasm of breast: Secondary | ICD-10-CM | POA: Insufficient documentation

## 2018-06-20 DIAGNOSIS — R55 Syncope and collapse: Secondary | ICD-10-CM | POA: Diagnosis not present

## 2018-06-20 DIAGNOSIS — N189 Chronic kidney disease, unspecified: Secondary | ICD-10-CM

## 2018-06-20 DIAGNOSIS — F32A Depression, unspecified: Secondary | ICD-10-CM

## 2018-06-20 DIAGNOSIS — I890 Lymphedema, not elsewhere classified: Secondary | ICD-10-CM | POA: Diagnosis not present

## 2018-06-20 DIAGNOSIS — K5730 Diverticulosis of large intestine without perforation or abscess without bleeding: Secondary | ICD-10-CM | POA: Diagnosis not present

## 2018-06-20 DIAGNOSIS — K219 Gastro-esophageal reflux disease without esophagitis: Secondary | ICD-10-CM | POA: Diagnosis not present

## 2018-06-20 DIAGNOSIS — M109 Gout, unspecified: Secondary | ICD-10-CM | POA: Insufficient documentation

## 2018-06-20 DIAGNOSIS — E871 Hypo-osmolality and hyponatremia: Secondary | ICD-10-CM | POA: Diagnosis present

## 2018-06-20 DIAGNOSIS — R110 Nausea: Secondary | ICD-10-CM | POA: Diagnosis not present

## 2018-06-20 DIAGNOSIS — N182 Chronic kidney disease, stage 2 (mild): Secondary | ICD-10-CM | POA: Diagnosis not present

## 2018-06-20 DIAGNOSIS — F329 Major depressive disorder, single episode, unspecified: Secondary | ICD-10-CM

## 2018-06-20 DIAGNOSIS — E1122 Type 2 diabetes mellitus with diabetic chronic kidney disease: Secondary | ICD-10-CM | POA: Insufficient documentation

## 2018-06-20 DIAGNOSIS — R531 Weakness: Secondary | ICD-10-CM

## 2018-06-20 DIAGNOSIS — Z923 Personal history of irradiation: Secondary | ICD-10-CM | POA: Diagnosis not present

## 2018-06-20 DIAGNOSIS — R197 Diarrhea, unspecified: Secondary | ICD-10-CM

## 2018-06-20 DIAGNOSIS — I951 Orthostatic hypotension: Secondary | ICD-10-CM | POA: Diagnosis not present

## 2018-06-20 DIAGNOSIS — N184 Chronic kidney disease, stage 4 (severe): Secondary | ICD-10-CM | POA: Diagnosis not present

## 2018-06-20 DIAGNOSIS — I700 Atherosclerosis of aorta: Secondary | ICD-10-CM | POA: Diagnosis not present

## 2018-06-20 DIAGNOSIS — R1084 Generalized abdominal pain: Secondary | ICD-10-CM | POA: Diagnosis not present

## 2018-06-20 DIAGNOSIS — Z9889 Other specified postprocedural states: Secondary | ICD-10-CM | POA: Insufficient documentation

## 2018-06-20 DIAGNOSIS — E118 Type 2 diabetes mellitus with unspecified complications: Secondary | ICD-10-CM

## 2018-06-20 DIAGNOSIS — Z9221 Personal history of antineoplastic chemotherapy: Secondary | ICD-10-CM | POA: Insufficient documentation

## 2018-06-20 DIAGNOSIS — I129 Hypertensive chronic kidney disease with stage 1 through stage 4 chronic kidney disease, or unspecified chronic kidney disease: Secondary | ICD-10-CM | POA: Diagnosis not present

## 2018-06-20 DIAGNOSIS — I10 Essential (primary) hypertension: Secondary | ICD-10-CM | POA: Diagnosis present

## 2018-06-20 DIAGNOSIS — Z885 Allergy status to narcotic agent status: Secondary | ICD-10-CM | POA: Insufficient documentation

## 2018-06-20 DIAGNOSIS — E860 Dehydration: Secondary | ICD-10-CM | POA: Diagnosis not present

## 2018-06-20 DIAGNOSIS — Z9012 Acquired absence of left breast and nipple: Secondary | ICD-10-CM | POA: Insufficient documentation

## 2018-06-20 DIAGNOSIS — E538 Deficiency of other specified B group vitamins: Secondary | ICD-10-CM | POA: Diagnosis not present

## 2018-06-20 DIAGNOSIS — N3289 Other specified disorders of bladder: Secondary | ICD-10-CM | POA: Diagnosis not present

## 2018-06-20 DIAGNOSIS — Z7982 Long term (current) use of aspirin: Secondary | ICD-10-CM | POA: Insufficient documentation

## 2018-06-20 DIAGNOSIS — D631 Anemia in chronic kidney disease: Secondary | ICD-10-CM | POA: Diagnosis not present

## 2018-06-20 DIAGNOSIS — E119 Type 2 diabetes mellitus without complications: Secondary | ICD-10-CM

## 2018-06-20 DIAGNOSIS — Z79899 Other long term (current) drug therapy: Secondary | ICD-10-CM | POA: Insufficient documentation

## 2018-06-20 DIAGNOSIS — Z882 Allergy status to sulfonamides status: Secondary | ICD-10-CM | POA: Insufficient documentation

## 2018-06-20 DIAGNOSIS — Z8249 Family history of ischemic heart disease and other diseases of the circulatory system: Secondary | ICD-10-CM | POA: Insufficient documentation

## 2018-06-20 DIAGNOSIS — N179 Acute kidney failure, unspecified: Secondary | ICD-10-CM | POA: Diagnosis not present

## 2018-06-20 LAB — CBC WITH DIFFERENTIAL/PLATELET
BASOS ABS: 0 10*3/uL (ref 0.0–0.1)
BASOS PCT: 0 %
EOS ABS: 0 10*3/uL (ref 0.0–0.7)
Eosinophils Relative: 1 %
HEMATOCRIT: 31 % — AB (ref 36.0–46.0)
Hemoglobin: 10.8 g/dL — ABNORMAL LOW (ref 12.0–15.0)
Lymphocytes Relative: 24 %
Lymphs Abs: 1.6 10*3/uL (ref 0.7–4.0)
MCH: 31.9 pg (ref 26.0–34.0)
MCHC: 34.8 g/dL (ref 30.0–36.0)
MCV: 91.4 fL (ref 78.0–100.0)
MONO ABS: 0.6 10*3/uL (ref 0.1–1.0)
Monocytes Relative: 8 %
NEUTROS ABS: 4.7 10*3/uL (ref 1.7–7.7)
Neutrophils Relative %: 67 %
PLATELETS: 235 10*3/uL (ref 150–400)
RBC: 3.39 MIL/uL — ABNORMAL LOW (ref 3.87–5.11)
RDW: 14.4 % (ref 11.5–15.5)
WBC: 6.9 10*3/uL (ref 4.0–10.5)

## 2018-06-20 LAB — URINALYSIS, ROUTINE W REFLEX MICROSCOPIC
Bilirubin Urine: NEGATIVE
Glucose, UA: NEGATIVE mg/dL
HGB URINE DIPSTICK: NEGATIVE
KETONES UR: NEGATIVE mg/dL
Leukocytes, UA: NEGATIVE
NITRITE: NEGATIVE
PROTEIN: NEGATIVE mg/dL
pH: 5.5 (ref 5.0–8.0)

## 2018-06-20 LAB — TROPONIN I

## 2018-06-20 LAB — SEDIMENTATION RATE: Sed Rate: 60 mm/hr — ABNORMAL HIGH (ref 0–22)

## 2018-06-20 LAB — COMPREHENSIVE METABOLIC PANEL
ALBUMIN: 3.1 g/dL — AB (ref 3.5–5.0)
ALT: 23 U/L (ref 0–44)
ANION GAP: 12 (ref 5–15)
AST: 33 U/L (ref 15–41)
Alkaline Phosphatase: 87 U/L (ref 38–126)
BILIRUBIN TOTAL: 0.6 mg/dL (ref 0.3–1.2)
BUN: 65 mg/dL — ABNORMAL HIGH (ref 8–23)
CALCIUM: 8.1 mg/dL — AB (ref 8.9–10.3)
CO2: 22 mmol/L (ref 22–32)
Chloride: 96 mmol/L — ABNORMAL LOW (ref 98–111)
Creatinine, Ser: 2.27 mg/dL — ABNORMAL HIGH (ref 0.44–1.00)
GFR calc Af Amer: 22 mL/min — ABNORMAL LOW (ref >60)
GFR, EST NON AFRICAN AMERICAN: 19 mL/min — AB (ref >60)
GLUCOSE: 109 mg/dL — AB (ref 70–99)
POTASSIUM: 4.7 mmol/L (ref 3.5–5.1)
Sodium: 130 mmol/L — ABNORMAL LOW (ref 135–145)
TOTAL PROTEIN: 6.9 g/dL (ref 6.5–8.1)

## 2018-06-20 LAB — C-REACTIVE PROTEIN: CRP: <0.8 mg/dL (ref <1.0)

## 2018-06-20 LAB — MAGNESIUM: MAGNESIUM: 1.6 mg/dL — AB (ref 1.7–2.4)

## 2018-06-20 LAB — LIPASE, BLOOD: LIPASE: 109 U/L — AB (ref 11–51)

## 2018-06-20 MED ORDER — ONDANSETRON HCL 4 MG PO TABS: 4.0000 mg | ORAL_TABLET | Freq: Four times a day (QID) | ORAL | Status: DC | PRN

## 2018-06-20 MED ORDER — ONDANSETRON HCL 4 MG/2ML IJ SOLN
4.0000 mg | Freq: Once | INTRAMUSCULAR | Status: DC
  Filled 2018-06-20: qty 2

## 2018-06-20 MED ORDER — SODIUM CHLORIDE 0.9% FLUSH: 3.0000 mL | Freq: Two times a day (BID) | INTRAVENOUS | Status: DC

## 2018-06-20 MED ORDER — HEPARIN SODIUM (PORCINE) 5000 UNIT/ML IJ SOLN
5000.0000 [IU] | Freq: Three times a day (TID) | INTRAMUSCULAR | Status: DC
  Administered 2018-06-20 – 2018-06-21 (×3): 5000 [IU] via SUBCUTANEOUS
  Filled 2018-06-20 (×3): qty 1

## 2018-06-20 MED ORDER — GABAPENTIN 100 MG PO CAPS
200.0000 mg | ORAL_CAPSULE | Freq: Every day | ORAL | Status: DC
  Administered 2018-06-21: 200 mg via ORAL
  Filled 2018-06-20: qty 2

## 2018-06-20 MED ORDER — METOPROLOL TARTRATE 25 MG PO TABS
25.0000 mg | ORAL_TABLET | Freq: Two times a day (BID) | ORAL | Status: DC
  Administered 2018-06-21 (×2): 25 mg via ORAL
  Filled 2018-06-20 (×2): qty 1

## 2018-06-20 MED ORDER — ATORVASTATIN CALCIUM 20 MG PO TABS
20.0000 mg | ORAL_TABLET | Freq: Every day | ORAL | Status: DC
  Administered 2018-06-21: 20 mg via ORAL
  Filled 2018-06-20: qty 1

## 2018-06-20 MED ORDER — SERTRALINE HCL 50 MG PO TABS
50.0000 mg | ORAL_TABLET | Freq: Every day | ORAL | Status: DC
  Administered 2018-06-21: 50 mg via ORAL
  Filled 2018-06-20: qty 1

## 2018-06-20 MED ORDER — ALBUTEROL SULFATE (2.5 MG/3ML) 0.083% IN NEBU: 2.5000 mg | INHALATION_SOLUTION | Freq: Four times a day (QID) | RESPIRATORY_TRACT | Status: DC | PRN

## 2018-06-20 MED ORDER — PANTOPRAZOLE SODIUM 40 MG PO TBEC
40.0000 mg | DELAYED_RELEASE_TABLET | Freq: Every day | ORAL | Status: DC
  Administered 2018-06-21 (×2): 40 mg via ORAL
  Filled 2018-06-20 (×2): qty 1

## 2018-06-20 MED ORDER — ONDANSETRON HCL 4 MG/2ML IJ SOLN: 4.0000 mg | Freq: Four times a day (QID) | INTRAMUSCULAR | Status: DC | PRN

## 2018-06-20 MED ORDER — AMLODIPINE BESYLATE 10 MG PO TABS
10.0000 mg | ORAL_TABLET | Freq: Every day | ORAL | Status: DC
  Administered 2018-06-21: 10 mg via ORAL
  Filled 2018-06-20: qty 1

## 2018-06-20 MED ORDER — SODIUM CHLORIDE 0.9 % IV BOLUS
1000.0000 mL | Freq: Once | INTRAVENOUS | Status: AC
  Administered 2018-06-20: 1000 mL via INTRAVENOUS

## 2018-06-20 MED ORDER — MAGNESIUM SULFATE 2 GM/50ML IV SOLN
2.0000 g | Freq: Once | INTRAVENOUS | Status: AC
  Administered 2018-06-20: 2 g via INTRAVENOUS
  Filled 2018-06-20: qty 50

## 2018-06-20 MED ORDER — SODIUM CHLORIDE 0.9 % IV SOLN
INTRAVENOUS | Status: DC
  Administered 2018-06-20 – 2018-06-21 (×2): via INTRAVENOUS

## 2018-06-20 MED ORDER — ACETAMINOPHEN 650 MG RE SUPP: 650.0000 mg | Freq: Four times a day (QID) | RECTAL | Status: DC | PRN

## 2018-06-20 MED ORDER — ACETAMINOPHEN 325 MG PO TABS: 650.0000 mg | ORAL_TABLET | Freq: Four times a day (QID) | ORAL | Status: DC | PRN

## 2018-06-20 MED ORDER — ASPIRIN EC 81 MG PO TBEC
81.0000 mg | DELAYED_RELEASE_TABLET | Freq: Every day | ORAL | Status: DC
  Administered 2018-06-21: 81 mg via ORAL
  Filled 2018-06-20: qty 1

## 2018-06-20 NOTE — Assessment & Plan Note (Signed)
Generalized weakness, diarrhea. Patient reports on and off diarrhea for a while, definitely worse lately.  Stool studies ordered by me last week came back negative.  Went to the ER twice, she had stable anemia and decreased renal function.  CT abdomen no acute. On chart review she has a normal B12, B1 and TSH. We did orthostatic vital signs on her and her BP dropped significantly when she stood up. At this point, she feels extremely weak, she is orthostatic, sometimes she feels like she is going to faint.  Lying down: 135 /56 pulse 59 Standing 101/49, pulse 64  I think the best next step would be to send her to the ER, give her some IV fluids, reassess how she is feeling on either send her home or keep her for further treatment. I spoke with the ER doctor who agrees to see the patient. Depression: Not clear if that is playing a role in how she feels Upper back pain: Chronic issue, has a history of breast cancer, last spine x-ray was January 2019 and she had no acute findings.  We will reassess the situation when she comes back. Cellulitis, left arm: She has chronic edema on that arm, arm looks back to normal.

## 2018-06-20 NOTE — Patient Instructions (Signed)
Please go downstairs to the ER, I already spoke with the doctor there

## 2018-06-20 NOTE — Telephone Encounter (Signed)
Copied from [place]. Topic: Inquiry >> Jun 20, 2018  4:33 PM Conception Chancy, NT wrote: Reason for CRM: patient husband is calling and states that the patient was seen today for an appointment with Dr. Larose Kells and was sent downstairs for further testing and was told he would receive the results within 2 hours. He states nobody has called and he is concerned.

## 2018-06-20 NOTE — Progress Notes (Signed)
Pre visit review using our clinic review tool, if applicable. No additional management support is needed unless otherwise documented below in the visit note. 

## 2018-06-20 NOTE — ED Notes (Signed)
Pt ambulated without any assistance and only stated that she had some dizziness.

## 2018-06-20 NOTE — ED Triage Notes (Signed)
Pt sent from PCP upstairs. She c/o generalized weakness following diarrhea for several weeks. She states the diarrhea stopped after taking loperamide. Denies pain.

## 2018-06-20 NOTE — Telephone Encounter (Signed)
Advise husband that I see that they already draw blood work and the doctor at the emergency room is aware of the situation.

## 2018-06-20 NOTE — H&P (Signed)
History and Physical    Samantha Mckenzie:[id number] DOB: [birth year] DOA: 06/20/2018  Referring MD/NP/PA: Chevis Pretty, MD PCP: Colon Branch, MD  Patient coming from: [facility] transfer  Chief Complaint: Fatigue and weakness  I have personally briefly reviewed patient's old medical records in [place]   HPI: Samantha Mckenzie is a 82 y.o. female with medical history significant of prior breast cancer (status post resection, chemo, radiation with residual lymphedema), HTN, HLD, chronic kidney disease stage IV followed by Dr. Clover Mealy, anemia, and depression; who presents with complaints of fatigue and weakness.  Patient reported symptoms started approximately 2 to 3 weeks ago with complaints of diarrhea 2-3 times per day.  Patient reports anything that she ate made her have a bowel movement.  Stools were noted to be rusty yellow in color.  Denies having any blood present.  Associated symptoms included lightheadedness, near syncope with changes in position, decreased appetite, upper epigastric abdominal discomfort.  Due to the symptoms she was seen in the emergency department 2 separate days.  On the last visit she was given Imodium which she took for 2-3 doses with resolution of diarrhea symptoms.  However, patient has not felt back to her baseline.  Even minimum activity causes her to feel significantly tired and drained.  Denies having any leg swelling, chest pain, cough, loss of consciousness, medication changes(besides taking Imodium), or recent falls.  Patient had been seen by her PCP on 8/26 for swelling of the upper extremity.  Imaging studies revealed no acute signs of DVT and she was supposed to start on doxycycline for presumed cellulitis, but unclear if patient took this medication..  ED Course: Upon admission to the emergency department patient was noted to be afebrile with blood pressures 126/58-181/63, and all other vital signs been normal limits.  Labs revealed WBC 6.9, hemoglobin  10.8, sodium 130, chloride 96, BUN 65, creatinine 2.27, magnesium 1.6, and lipase 109.  Urinalysis was negative for signs of infection.  Patient was given 2 L of normal saline IV fluids and TRH called to admit.  Review of Systems  Constitutional: Positive for malaise/fatigue. Negative for fever.  HENT: Negative for ear discharge and nosebleeds.   Eyes: Negative for pain and discharge.  Respiratory: Negative for cough and shortness of breath.   Cardiovascular: Negative for chest pain and leg swelling.  Gastrointestinal: Positive for abdominal pain and nausea. Negative for blood in stool and vomiting.  Genitourinary: Negative for dysuria and hematuria.  Musculoskeletal: Negative for back pain and falls.  Skin: Negative for itching and rash.  Neurological: Positive for dizziness and weakness. Negative for focal weakness and loss of consciousness.  Psychiatric/Behavioral: Negative for memory loss and substance abuse.    Past Medical History:  Diagnosis Date  . Allergic rhinitis   . Anemia   . Breast CA ([place])    surgery, chemo, XRT; had peripheral neuropathy (imbalance at times) after chemo  . Cellulitis    Left arm, recurrent   . Chronic renal disease, stage IV ([facility])    Dr. Moshe Cipro  . Depression   . Fatigue   . GERD (gastroesophageal reflux disease)    gastritis, EGD 02/2007  . Gout   . Hyperlipidemia   . Hypertension   . Macular degeneration 2011  . Normal cardiac stress test 12/2014  . Osteoarthritis   . Renal insufficiency     Past Surgical History:  Procedure Laterality Date  . cataracts bilaterally  1 -2012  . [place]  neg  . MASTECTOMY Left 1999   and lymphnodes      reports that she has never smoked. She has never used smokeless tobacco. She reports that she drinks alcohol. She reports that she does not use drugs.  Allergies  Allergen Reactions  . Codeine Nausea And Vomiting  . Morphine Nausea And Vomiting  . Oxycodone-Acetaminophen Nausea And  Vomiting  . Sulfonamide Derivatives Nausea And Vomiting  . Tramadol Hcl Nausea And Vomiting    Family History  Problem Relation Age of Onset  . Breast cancer Other        ? of   . Heart attack Father 6  . Diabetes Sister        2  . Hypertension Sister   . Colon cancer Neg Hx   . Stomach cancer Neg Hx     Prior to Admission medications   Medication Sig Start Date End Date Taking? Authorizing Provider  allopurinol (ZYLOPRIM) 100 MG tablet Take 1 tablet (100 mg total) by mouth daily. 05/22/18   Colon Branch, MD  amLODipine (NORVASC) 10 MG tablet Take 1 tablet (10 mg total) by mouth daily. 06/13/18   Colon Branch, MD  aspirin 81 MG tablet Take 81 mg by mouth daily.      [provider]  atorvastatin (LIPITOR) 20 MG tablet Take 1 tablet (20 mg total) by mouth daily. 05/22/18   Colon Branch, MD  Cholecalciferol (VITAMIN D3) 1000 UNITS CAPS Take 1 capsule by mouth daily.     [provider]  esomeprazole (NEXIUM) 40 MG capsule Take 40 mg by mouth daily at 12 noon.    [provider]  gabapentin (NEURONTIN) 100 MG capsule Take 1 capsule (100 mg total) by mouth 3 (three) times daily. 02/22/18   Saguier, Percell Miller, PA-C  loperamide (IMODIUM) 2 MG capsule Take 1 capsule (2 mg total) by mouth 4 (four) times daily as needed for diarrhea or loose stools. 06/16/18   Quintella Reichert, MD  metoprolol tartrate (LOPRESSOR) 25 MG tablet Take 1 tablet (25 mg total) by mouth daily. 05/22/18   Colon Branch, MD  Multiple Vitamins-Minerals (CENTRUM SILVER PO) Take 1 tablet by mouth daily.     [provider]  ondansetron (ZOFRAN ODT) 4 MG disintegrating tablet Take 1 tablet (4 mg total) by mouth every 8 (eight) hours as needed for nausea or vomiting. 06/16/18   Quintella Reichert, MD  sertraline (ZOLOFT) 25 MG tablet Take 2 tablets (50 mg total) by mouth daily. 06/13/18   Colon Branch, MD  valsartan (DIOVAN) 160 MG tablet Take 1 tablet (160 mg total) by mouth daily. 06/13/18   Colon Branch, MD    vitamin B-12 (CYANOCOBALAMIN) 1000 MCG tablet Take 1,000 mcg by mouth daily.      [provider]    Physical Exam:  Constitutional: Elderly female who appears fatigued, but otherwise in no acute distress. Vitals:   06/20/18 1524 06/20/18 1900 06/20/18 1930 06/20/18 2042  BP: (!) 142/69 (!) 178/63 (!) 181/63   Pulse: 75 73 79   Resp: _0 Temp: 98 F (36.7 C)     TempSrc: Oral     SpO2: 99% 97% 100%   Weight:    58.4 kg  Height:    _1  (1.651 m)   Eyes: PERRL, lids and conjunctivae normal ENMT: Mucous membranes are moist. Posterior pharynx clear of any exudate or lesions.Normal dentition.  Neck: normal, supple, no masses, no thyromegaly Respiratory:  clear to auscultation bilaterally, no wheezing, no crackles. Normal respiratory effort. No accessory muscle use.  Cardiovascular: Regular rate and rhythm, no murmurs / rubs / gallops. No extremity edema. 2+ pedal pulses. No carotid bruits.  Abdomen: no tenderness, no masses palpated. No hepatosplenomegaly. Bowel sounds positive.  Musculoskeletal: no clubbing / cyanosis. No joint deformity upper and lower extremities. Good ROM, no contractures. Normal muscle tone.  Skin: no rashes, lesions, ulcers. No induration Neurologic: CN 2-12 grossly intact. Sensation intact, DTR normal. Strength 5/5 in all 4.  Psychiatric: Normal judgment and insight. Alert and oriented x 3. Normal mood.     Labs on Admission: I have personally reviewed following labs and imaging studies  CBC: Recent Labs  Lab 06/15/18 0952 06/16/18 0959 06/20/18 1235  WBC 6.4 7.9 6.9  NEUTROABS 4.6 5.8 4.7  HGB 10.5* 10.7* 10.8*  HCT 30.3* 32.6* 31.0*  MCV 92.1 95.0 91.4  PLT 255 235 841   Basic Metabolic Panel: Recent Labs  Lab 06/15/18 1130 06/16/18 0959 06/20/18 1235  NA 134* 133* 130*  K 4.6 4.4 4.7  CL 104 104 96*  CO2 20* 22 22  GLUCOSE 114* 116* 109*  BUN 65* 54* 65*  CREATININE 1.96* 1.94* 2.27*  CALCIUM 8.2* 8.4* 8.1*  MG  --    --  1.6*   GFR: Estimated Creatinine Clearance: 18.1 mL/min (A) (by C-G formula based on SCr of 2.27 mg/dL (H)). Liver Function Tests: Recent Labs  Lab 06/15/18 1130 06/16/18 0959 06/20/18 1235  AST 28 30 33  ALT _0 ALKPHOS 67 72 87  BILITOT 0.7 0.9 0.6  PROT 7.2 7.1 6.9  ALBUMIN 3.3* 3.2* 3.1*   Recent Labs  Lab 06/15/18 1130 06/16/18 0959 06/20/18 1235  LIPASE 77* 80* 109*   No results for input(s): AMMONIA in the last 168 hours. Coagulation Profile: No results for input(s): INR, PROTIME in the last 168 hours. Cardiac Enzymes: No results for input(s): CKTOTAL, CKMB, CKMBINDEX, TROPONINI in the last 168 hours. BNP (last 3 results) No results for input(s): PROBNP in the last 8760 hours. HbA1C: No results for input(s): HGBA1C in the last 72 hours. CBG: No results for input(s): GLUCAP in the last 168 hours. Lipid Profile: No results for input(s): CHOL, HDL, LDLCALC, TRIG, CHOLHDL, LDLDIRECT in the last 72 hours. Thyroid Function Tests: No results for input(s): TSH, T4TOTAL, FREET4, T3FREE, THYROIDAB in the last 72 hours. Anemia Panel: No results for input(s): VITAMINB12, FOLATE, FERRITIN, TIBC, IRON, RETICCTPCT in the last 72 hours. Urine analysis:    Component Value Date/Time   COLORURINE YELLOW 06/20/2018 1522   APPEARANCEUR CLEAR 06/20/2018 1522   LABSPEC <1.005 (L) 06/20/2018 1522   PHURINE 5.5 06/20/2018 1522   GLUCOSEU NEGATIVE 06/20/2018 1522   HGBUR NEGATIVE 06/20/2018 1522   BILIRUBINUR NEGATIVE 06/20/2018 1522   KETONESUR NEGATIVE 06/20/2018 1522   PROTEINUR NEGATIVE 06/20/2018 1522   UROBILINOGEN 0.2 12/29/2014 1400   NITRITE NEGATIVE 06/20/2018 1522   LEUKOCYTESUR NEGATIVE 06/20/2018 1522   Sepsis Labs: Recent Results (from the past 240 hour(s))  Stool Culture     Status: None   Collection Time: 06/14/18 10:16 AM  Result Value Ref Range Status   MICRO NUMBER: [id number]  Final   SPECIMEN QUALITY: ADEQUATE  Final   SOURCE: STOOL  Final    STATUS: FINAL  Final   SHIGA RESULT: Not Detected  Final   MICRO NUMBER: [id number]  Final   SPECIMEN QUALITY: ADEQUATE  Final   Source STOOL  Final   STATUS: FINAL  Final   CAM RESULT: No enteric Campylobacter isolated  Final   MICRO NUMBER: [id number]  Final   SPECIMEN QUALITY: ADEQUATE  Final   SOURCE: STOOL  Final   STATUS: FINAL  Final   SS RESULT: No Salmonella or Shigella isolated  Final     Radiological Exams on Admission: Ct Abdomen Pelvis Wo Contrast  Result Date: 06/20/2018 CLINICAL DATA:  Diarrhea for 2 weeks EXAM: CT ABDOMEN AND PELVIS WITHOUT CONTRAST TECHNIQUE: Multidetector CT imaging of the abdomen and pelvis was performed following the standard protocol without IV contrast. COMPARISON:  06/16/2018 FINDINGS: Lower chest: Dependent atelectasis. Hepatobiliary: Unremarkable Pancreas: Unremarkable Spleen: Calcified granulomata. Adrenals/Urinary Tract: Stable hypodensities in the kidneys. Moderate atrophy of the cortex. Adrenal glands are unremarkable. Bladder is distended. Stomach/Bowel: Appendix is nonvisualized. No disproportionate dilatation of bowel. Air-fluid levels are scattered throughout both small and large bowel. No obvious mass in the colon. Sigmoid diverticulosis without evidence of acute diverticulitis. Stomach is decompressed. Vascular/Lymphatic: Atherosclerotic calcifications of the aorta and iliac arteries. No abnormal retroperitoneal adenopathy. Reproductive: Small calcification in the fundus of the uterus may simply represent a calcified fibroid. Adnexa are unremarkable. Other: No free fluid. Musculoskeletal: No vertebral compression deformity. Lumbosacral junction is transitional. Small ribs at T12. L5 is sacralized. There is anterolisthesis L4 upon L5 associated with advanced facet arthropathy. IMPRESSION: No acute intra-abdominal or intrapelvic process. Bladder distention. Sigmoid diverticulosis without acute diverticulitis. Electronically Signed   By: Marybelle Killings M.D.    On: 06/20/2018 15:03    EKG: Independently reviewed.  Sinus rhythm at 58 bpm with left ventricular hypertrophy  Assessment/Plan Generalized weakness, presyncope: Acute.  Patient reports having generalized weakness and fatigue with near syncope after weeks of diarrhea now improved.  Suspect dehydration is likely cause of symptoms with findings of decreased kidney function with elevated BUN. - Admit to a telemetry bed - Check orthostatic vital signs - Check ESR, CRP, troponin - Follow-up telemetry overnight - Physical therapy to eval and treat in a.m.  Acute kidney injury superimposed on chronic kidney disease stage IV 2/2 dehydration: At baseline patient creatinine appears to have recently been around 1.9.  However patient creatinine 2.27 with BUN 65.  Given recent diarrhea suspect prerenal cause.  Patient had received 2 L of normal saline IV fluids in the emergency department.  Patient followed in outpatient setting by Dr. Moshe Cipro of nephrology. - Gentle IV fluids normal saline at 75 mL/h overnight as tolerated - Monitor intake and output  Diarrhea: Resolved.  Patient reports resolution of diarrhea after taking couple doses of Imodium.  Epigastric abdominal discomfort: Patient reports having upper epigastric abdominal discomfort and decreased appetite.  Question possibility of gastritis. - Protonix  Hypomagnesia: Acute.  Initial magnesium level 1.6 on admission. - Give 2 g of magnesium sulfate IV x1 dose now - Continue to monitor and replace as needed  Normocytic normochromic anemia: Hemoglobin 10.8 which appears near her baseline upon review of records. - Continue to monitor  Hyponatremia: Acute on chronic.  Presents with sodium of 130 which is lower than baseline.  Possible contributing factors include recent diarrhea and Zoloft. - IV fluids as seen above  Essential hypertension - Continue amlodipine and metoprolol - Held valsartan  History of breast cancer: Patient  status post mastectomy with chemo and radiation.  Depression - Continue Zoloft  Hyperlipidemia - Continue atorvastatin  GI prophylaxis: Protonix DVT prophylaxis: Heparin Code Status: Full Family Communication: No family present at bedside Disposition Plan: To  be determined Consults called: None Admission status: Observation  Norval Morton MD [facility] Pager [phone]   If 7PM-7AM, please contact night-coverage [email or web address] Password [facility]  06/20/2018, 8:46 PM

## 2018-06-20 NOTE — ED Notes (Signed)
ED Provider at bedside. 

## 2018-06-20 NOTE — ED Notes (Signed)
Pt on monitor 

## 2018-06-20 NOTE — Progress Notes (Addendum)
Transfer from [facility] for generalized weakness, ? Presyncope patient feels she is going to pass out.  Patient had several weeks of diarrhea that has now resolved.  3rd ED visit in a week.  Appears dehydrated.  Creatinine mildly elevated/close to baseline.  Called to admit/transfer for ? Presyncope, and generalized weakness.   Temporary admit order- obs, tele.  LOS- NO CHARGE.   Bing Neighbors, MD. [facility]

## 2018-06-20 NOTE — Telephone Encounter (Signed)
Notified pt's spouse and advised him that ER doctor would discuss results with pt in the ER and advise her of the next steps. Notified Abby in the ER to please have pt call spouse with any updates as she can to ease his concern.

## 2018-06-20 NOTE — ED Provider Notes (Signed)
[facility] EMERGENCY DEPARTMENT Provider Note   CSN: [id number] Arrival date & time: 06/20/18  1215     History   Chief Complaint Chief Complaint  Patient presents with  . Weakness    HPI Samantha Mckenzie is a 82 y.o. female with a hx of anemia, CKD stabe IV, GERD, hyperlipidemia, hypertension, T2DM, and depression who presents to the ED from PCP appointment for generalized weakness and orthostatic hypotension in office.   Information Per Chart Review: Patient seen by PCP and in the ED x 2 for generalized weakness, abdominal discomfort, and non bloody diarrhea since 09/03. Sxs reportedly ongoing for 2-3 weeks. Initial PCP visit 09/03 stool cultures and C. Diff testing performed- negative. 09/05 and 09/06 ER visits- Initial visit work-up included a CBC with a stable anemia, urinalysis negative, sodium 134 slightly low.  Creatinine 1.96, better than baseline.  Lipase 77 slightly elevated. Ultimately discharged home and returned subsequent day with continued sxs, had repeat labs which were stable. CT abdomen/pelvis without acute findings at that time as well. Was discharged home with zofran and imodium with PCP follow up.  Information Per Patient: Patient states that she has had resolution of her diarrhea with taking imodium since last ER visit, stopped taking this day before last due to trouble having a bowel movement, she stopping medicine she has had a bowel movement and is passing gas. She has however continued to experience diffuse abdominal "soreness" that is a 5/10 in severity, decreased appetite, occasional nausea without vomiting, and generalized weakness. Generalized weakness is fairly constant, worse with activity and when transitioning from sitting to standing- with these activities she becomes lightheaded and feels almost near syncopal, but does not believe she has passed out. With her decreased appetite she has had 2-3 16 ounce bottles of water daily, yesterday had some  chicken noodle soup, today attempted to eat eggs and toast and could not finish her meal, she states she just does not feel hungry, not necessarily unable to due to nausea. She was seen in PCP office today for continued weakness, was found to be orthostatic, and sent to ER for further evaluation and IV fluids. EDP spoke with patient's PCP.  Patient denies fever, chills, chest pain, dyspnea, focal weakness, numbness, headache, change in vision, blood in stool, vomiting, or dysuria.    HPI  Past Medical History:  Diagnosis Date  . Allergic rhinitis   . Anemia   . Breast CA ([place])    surgery, chemo, XRT; had peripheral neuropathy (imbalance at times) after chemo  . Cellulitis    Left arm, recurrent   . Chronic renal disease, stage IV (HCC)    Dr. Moshe Cipro  . Depression   . Fatigue   . GERD (gastroesophageal reflux disease)    gastritis, EGD 02/2007  . Gout   . Hyperlipidemia   . Hypertension   . Macular degeneration 2011  . Normal cardiac stress test 12/2014  . Osteoarthritis   . Renal insufficiency     Patient Active Problem List   Diagnosis Date Noted  . PCP NOTES >>> 07/11/2015  . Myalgia and myositis 03/08/2014  . Dizziness and giddiness 05/06/2013  . Leg pain, RLS? neuropathy? 06/21/2012  . Cellulitis LEFT arm recurrent 12/08/2011  . Annual physical exam 06/22/2011  . Depression   . NECK PAIN 12/28/2010  . Fatigue 12/28/2010  . VITAMIN B12 DEFICIENCY 04/01/2008  . GERD 12/19/2007  . NEOP, MALIGNANT, FEMALE BREAST NOS 07/07/2007  . RENAL INSUFFICIENCY 07/07/2007  .  Osteoarthritis 07/07/2007  . DM II (diabetes mellitus, type II), controlled ([place]) 02/28/2007  . Hyperlipidemia 02/28/2007  . GOUT 02/28/2007  . ANEMIA-NOS 02/28/2007  . Essential hypertension 02/28/2007    Past Surgical History:  Procedure Laterality Date  . cataracts bilaterally  1 -2012  . LUNG BIOPSY  1999   neg  . MASTECTOMY Left 1999   and lymphnodes      OB History   None      Home  Medications    Prior to Admission medications   Medication Sig Start Date End Date Taking? Authorizing Provider  allopurinol (ZYLOPRIM) 100 MG tablet Take 1 tablet (100 mg total) by mouth daily. 05/22/18   Colon Branch, MD  amLODipine (NORVASC) 10 MG tablet Take 1 tablet (10 mg total) by mouth daily. 06/13/18   Colon Branch, MD  aspirin 81 MG tablet Take 81 mg by mouth daily.      [provider]  atorvastatin (LIPITOR) 20 MG tablet Take 1 tablet (20 mg total) by mouth daily. 05/22/18   Colon Branch, MD  Cholecalciferol (VITAMIN D3) 1000 UNITS CAPS Take 1 capsule by mouth daily.     [provider]  esomeprazole (NEXIUM) 40 MG capsule Take 40 mg by mouth daily at 12 noon.    [provider]  gabapentin (NEURONTIN) 100 MG capsule Take 1 capsule (100 mg total) by mouth 3 (three) times daily. 02/22/18   Saguier, Percell Miller, PA-C  loperamide (IMODIUM) 2 MG capsule Take 1 capsule (2 mg total) by mouth 4 (four) times daily as needed for diarrhea or loose stools. 06/16/18   Quintella Reichert, MD  metoprolol tartrate (LOPRESSOR) 25 MG tablet Take 1 tablet (25 mg total) by mouth daily. 05/22/18   Colon Branch, MD  Multiple Vitamins-Minerals (CENTRUM SILVER PO) Take 1 tablet by mouth daily.     [provider]  ondansetron (ZOFRAN ODT) 4 MG disintegrating tablet Take 1 tablet (4 mg total) by mouth every 8 (eight) hours as needed for nausea or vomiting. 06/16/18   Quintella Reichert, MD  sertraline (ZOLOFT) 25 MG tablet Take 2 tablets (50 mg total) by mouth daily. 06/13/18   Colon Branch, MD  valsartan (DIOVAN) 160 MG tablet Take 1 tablet (160 mg total) by mouth daily. 06/13/18   Colon Branch, MD  vitamin B-12 (CYANOCOBALAMIN) 1000 MCG tablet Take 1,000 mcg by mouth daily.      [provider]    Family History Family History  Problem Relation Age of Onset  . Breast cancer Other        ? of   . Heart attack Father 4  . Diabetes Sister        2  . Hypertension Sister   . Colon  cancer Neg Hx   . Stomach cancer Neg Hx     Social History Social History   Tobacco Use  . Smoking status: Never Smoker  . Smokeless tobacco: Never Used  Substance Use Topics  . Alcohol use: Yes    Alcohol/week: 0.0 standard drinks    Comment: rarely  . Drug use: No     Allergies   Codeine; Morphine; Oxycodone-acetaminophen; Sulfonamide derivatives; and Tramadol hcl   Review of Systems Review of Systems  Constitutional: Negative for chills and fever.  Respiratory: Negative for shortness of breath.   Cardiovascular: Negative for chest pain.  Gastrointestinal: Positive for abdominal pain, diarrhea (now resolved) and nausea. Negative for blood in stool, rectal pain and vomiting.  Genitourinary: Negative for dysuria.  Neurological: Positive for weakness (generalized, non focal) and light-headedness. Negative for dizziness, seizures, syncope, facial asymmetry, numbness and headaches.  All other systems reviewed and are negative.   Physical Exam Updated Vital Signs BP (!) 145/63 (BP Location: Right Arm)   Pulse (!) 59   Temp 98.5 F (36.9 C) (Oral)   Resp 16   Ht 5' 2"  (1.575 m)   Wt 59.6 kg   SpO2 99%   BMI 24.03 kg/m   Physical Exam  Constitutional: She appears well-developed and well-nourished.  Non-toxic appearance. No distress.  HENT:  Head: Normocephalic and atraumatic.  Mouth/Throat: Uvula is midline. Mucous membranes are dry.  Eyes: Pupils are equal, round, and reactive to light. Conjunctivae and EOM are normal. Right eye exhibits no discharge. Left eye exhibits no discharge.  Neck: Normal range of motion. Neck supple.  Cardiovascular: Normal rate and regular rhythm.  No murmur heard. Pulmonary/Chest: Effort normal and breath sounds normal. No respiratory distress. She has no wheezes. She has no rhonchi. She has no rales.  Respiration even and unlabored  Abdominal: Soft. She exhibits no distension. There is generalized tenderness (mild, non focal). There is  no rigidity, no rebound, no guarding and no CVA tenderness.  Normoactive bowel sounds in all four quadrants.   Neurological: She is alert.  Clear speech.  CN III through XII grossly intact.  Normal finger-nose.  Negative pronator drift.  Symmetric 5 out of 5 grip strength and symmetric 5 out of 5 strength with plantar dorsiflexion bilaterally.  Sensation grossly intact x4. Patient felt lightheaded when she attempted to stand, will defer at this time.   Skin: Skin is warm and dry. No rash noted.  Psychiatric: She has a normal mood and affect. Her behavior is normal.  Nursing note and vitals reviewed.   ED Treatments / Results  Labs Results for orders placed or performed during the hospital encounter of 06/20/18  Comprehensive metabolic panel  Result Value Ref Range   Sodium 130 (L) 135 - 145 mmol/L   Potassium 4.7 3.5 - 5.1 mmol/L   Chloride 96 (L) 98 - 111 mmol/L   CO2 22 22 - 32 mmol/L   Glucose, Bld 109 (H) 70 - 99 mg/dL   BUN 65 (H) 8 - 23 mg/dL   Creatinine, Ser 2.27 (H) 0.44 - 1.00 mg/dL   Calcium 8.1 (L) 8.9 - 10.3 mg/dL   Total Protein 6.9 6.5 - 8.1 g/dL   Albumin 3.1 (L) 3.5 - 5.0 g/dL   AST 33 15 - 41 U/L   ALT 23 0 - 44 U/L   Alkaline Phosphatase 87 38 - 126 U/L   Total Bilirubin 0.6 0.3 - 1.2 mg/dL   GFR calc non Af Amer 19 (L) >60 mL/min   GFR calc Af Amer 22 (L) >60 mL/min   Anion gap 12 5 - 15  Lipase, blood  Result Value Ref Range   Lipase 109 (H) 11 - 51 U/L  CBC with Differential  Result Value Ref Range   WBC 6.9 4.0 - 10.5 K/uL   RBC 3.39 (L) 3.87 - 5.11 MIL/uL   Hemoglobin 10.8 (L) 12.0 - 15.0 g/dL   HCT 31.0 (L) 36.0 - 46.0 %   MCV 91.4 78.0 - 100.0 fL   MCH 31.9 26.0 - 34.0 pg   MCHC 34.8 30.0 - 36.0 g/dL   RDW 14.4 11.5 - 15.5 %   Platelets 235 150 - 400 K/uL   Neutrophils Relative %  67 %   Neutro Abs 4.7 1.7 - 7.7 K/uL   Lymphocytes Relative 24 %   Lymphs Abs 1.6 0.7 - 4.0 K/uL   Monocytes Relative 8 %   Monocytes Absolute 0.6 0.1 - 1.0 K/uL    Eosinophils Relative 1 %   Eosinophils Absolute 0.0 0.0 - 0.7 K/uL   Basophils Relative 0 %   Basophils Absolute 0.0 0.0 - 0.1 K/uL  Magnesium  Result Value Ref Range   Magnesium 1.6 (L) 1.7 - 2.4 mg/dL  Urinalysis, Routine w reflex microscopic  Result Value Ref Range   Color, Urine YELLOW YELLOW   APPearance CLEAR CLEAR   Specific Gravity, Urine <1.005 (L) 1.005 - 1.030   pH 5.5 5.0 - 8.0   Glucose, UA NEGATIVE NEGATIVE mg/dL   Hgb urine dipstick NEGATIVE NEGATIVE   Bilirubin Urine NEGATIVE NEGATIVE   Ketones, ur NEGATIVE NEGATIVE mg/dL   Protein, ur NEGATIVE NEGATIVE mg/dL   Nitrite NEGATIVE NEGATIVE   Leukocytes, UA NEGATIVE NEGATIVE    EKG EKG Interpretation  Date/Time:  Tuesday June 20 2018 13:00:47 EDT Ventricular Rate:  58 PR Interval:    QRS Duration: 92 QT Interval:  417 QTC Calculation: 410 R Axis:   22 Text Interpretation:  Sinus rhythm Left ventricular hypertrophy inferior Q waves unchanged No significant change since last tracing in march 2016 Confirmed by Merrily Pew [phone]) on 06/20/2018 1:26:38 PM   Radiology Ct Abdomen Pelvis Wo Contrast  Result Date: 06/20/2018 CLINICAL DATA:  Diarrhea for 2 weeks EXAM: CT ABDOMEN AND PELVIS WITHOUT CONTRAST TECHNIQUE: Multidetector CT imaging of the abdomen and pelvis was performed following the standard protocol without IV contrast. COMPARISON:  06/16/2018 FINDINGS: Lower chest: Dependent atelectasis. Hepatobiliary: Unremarkable Pancreas: Unremarkable Spleen: Calcified granulomata. Adrenals/Urinary Tract: Stable hypodensities in the kidneys. Moderate atrophy of the cortex. Adrenal glands are unremarkable. Bladder is distended. Stomach/Bowel: Appendix is nonvisualized. No disproportionate dilatation of bowel. Air-fluid levels are scattered throughout both small and large bowel. No obvious mass in the colon. Sigmoid diverticulosis without evidence of acute diverticulitis. Stomach is decompressed. Vascular/Lymphatic:  Atherosclerotic calcifications of the aorta and iliac arteries. No abnormal retroperitoneal adenopathy. Reproductive: Small calcification in the fundus of the uterus may simply represent a calcified fibroid. Adnexa are unremarkable. Other: No free fluid. Musculoskeletal: No vertebral compression deformity. Lumbosacral junction is transitional. Small ribs at T12. L5 is sacralized. There is anterolisthesis L4 upon L5 associated with advanced facet arthropathy. IMPRESSION: No acute intra-abdominal or intrapelvic process. Bladder distention. Sigmoid diverticulosis without acute diverticulitis. Electronically Signed   By: Marybelle Killings M.D.   On: 06/20/2018 15:03    Procedures Procedures (including critical care time)  Medications Ordered in ED Medications - No data to display   Initial Impression / Assessment and Plan / ED Course  I have reviewed the triage vital signs and the nursing notes.  Pertinent labs & imaging results that were available during my care of the patient were reviewed by me and considered in my medical decision making (see chart for details).   Patient presents from PCP office for continued generalized weakness and findings consistent with orthostatic hypotension (per PCP note: Lying down: 135 /56 pulse 59, Standing 101/49, pulse 64). Patient nontoxic appearing, resting vitals without significant abnormality in the ER, overall fairly benign physical exam. No peritoneal signs on abdominal exam, generalized tenderness. Patient passing gas and has had a bowel movement, do not suspect obstruction/perforation. She does have some lightheadedness with near syncope sensation when attempt to stand on exam,  no focal neurologic deficits. Will further evaluate with labs and EKG at this time. Plan for IVF hydration and re-assessment.   Patient's work-up in the ER has been reviewed:  Patient's creatine/BUN are elevated at 2.27/65 this is increased from prior 1.94/54 at visit four days ago. Mild  electrolyte derangements include hyponatremic at 130, hypochloremic at 96, hypocalcemic at 8.1, and hypomagnesemic at 1.6.  Her LFTs are within normal limits. No leukocytosis. Hgb stable at 10.8. : Laboratory evaluation appears consistent with dehydration, patient has received 1 L NS thus far, additional 1 L ordered. EKG without significant change from prior. Her lipase continues to trend up, 109 today, recently 107 and 80 with past ER visits- discussed with supervising physician Dr. Dayna [place]- recommends CT abdomen/pelvis wo contrast given patient can likely not have IV contrast and will likely not tolerate PO.   CT grossly unremarkable, no acute findings.   Discussed at length with patient and her son via telephone, patient's son is concerned given her persistent weakness and refusal to eat that she will be unsafe at home and return. Patient with persistent generalized weakness and decreased appetite. She continues to complain of of lightheadedness with ambulation despite 2L of fluids, feel it is reasonable to admit her to hospitalist service given presentation in combination with this being patient's 3rd ER visit for same in past 5 days. Patient is agreeable.   Findings and plan of care discussed with supervising physician Dr. Dayna [place] who personally evaluated and examined this patient and is in agreement.   18:43: CONSULT: Discussed with hospitalist Dr. Denton Brick- accepts for observation.   Final Clinical Impressions(s) / ED Diagnoses   Final diagnoses:  Generalized weakness  Orthostatic hypotension  Generalized abdominal pain    ED Discharge Orders    None       Amaryllis Dyke, PA-C 06/21/18 1153    Mesner, Corene Cornea, MD 06/21/18 2122

## 2018-06-20 NOTE — Progress Notes (Signed)
Subjective:    Patient ID: Samantha Mckenzie, female    DOB: 1935/12/31, 82 y.o.   MRN: [id number]  DOS:  06/20/2018 Type of visit - description :  Interval history:  Since the last office visit 06/13/2018 went to the ER twice. 06/15/2018: Her main concern was generalized weakness and diarrhea. The patient was afebrile, nontoxic, abdominal exam benign. Work-up included a CBC with a stable anemia, urinalysis negative, sodium 134 slightly low.  Creatinine 1.96, better than baseline.  Lipase 77 slightly elevated. Was released home. 06/16/2018: Went back to the ER again c/o  weakness, she arrived by EMS. She continue reporting diarrhea. CMP, CBC remained stable.  Lipase is slightly elevated at 80.  CT of the abdomen and pelvis with no acute findings.   Review of Systems She is here with her husband for a follow-up. Again her main concern is generalized weakness.  I asked several questions tried to get a better idea of how she feels but she could not clarify that, "I am just weak", "I feel like I am going to faint sometimes" Diarrhea has stopped after she took loperamide. Denies chest pain no difficulty breathing No cough or wheezing. She has a history of back pain, seems to be worse lately. Admits to poor appetite. Has some ill-defined abdominal pain. I asked about anxiety, depression: She was not very clear on her answer, feels like she depressed because she is not feeling well physically.  Past Medical History:  Diagnosis Date  . Allergic rhinitis   . Anemia   . Breast CA ([place])    surgery, chemo, XRT; had peripheral neuropathy (imbalance at times) after chemo  . Cellulitis    Left arm, recurrent   . Chronic renal disease, stage IV (HCC)    Dr. Moshe Cipro  . Depression   . Fatigue   . GERD (gastroesophageal reflux disease)    gastritis, EGD 02/2007  . Gout   . Hyperlipidemia   . Hypertension   . Macular degeneration 2011  . Normal cardiac stress test 12/2014  . Osteoarthritis     . Renal insufficiency     Past Surgical History:  Procedure Laterality Date  . cataracts bilaterally  1 -2012  . LUNG BIOPSY  1999   neg  . MASTECTOMY Left 1999   and lymphnodes     Social History   Socioeconomic History  . Marital status: Married    Spouse name: Not on file  . Number of children: 2  . Years of education: Not on file  . Highest education level: Not on file  Occupational History  . Occupation: retired     Fish farm manager: RETIRED  Social Needs  . Financial resource strain: Not on file  . Food insecurity:    Worry: Not on file    Inability: Not on file  . Transportation needs:    Medical: Not on file    Non-medical: Not on file  Tobacco Use  . Smoking status: Never Smoker  . Smokeless tobacco: Never Used  Substance and Sexual Activity  . Alcohol use: Yes    Alcohol/week: 0.0 standard drinks    Comment: rarely  . Drug use: No  . Sexual activity: Not on file  Lifestyle  . Physical activity:    Days per week: Not on file    Minutes per session: Not on file  . Stress: Not on file  Relationships  . Social connections:    Talks on phone: Not on file    Gets  together: Not on file    Attends religious service: Not on file    Active member of club or organization: Not on file    Attends meetings of clubs or organizations: Not on file    Relationship status: Not on file  . Intimate partner violence:    Fear of current or ex partner: Not on file    Emotionally abused: Not on file    Physically abused: Not on file    Forced sexual activity: Not on file  Other Topics Concern  . Not on file  Social History Narrative   Lives w/ husband , 2 children in [place], 2 [place]              Allergies as of 06/20/2018      Reactions   Codeine Nausea And Vomiting   Morphine Nausea And Vomiting   Oxycodone-acetaminophen Nausea And Vomiting   Sulfonamide Derivatives Nausea And Vomiting   Tramadol Hcl Nausea And Vomiting      Medication List        Accurate as of  06/20/18  6:24 PM. Always use your most recent med list.          allopurinol 100 MG tablet Commonly known as:  ZYLOPRIM Take 1 tablet (100 mg total) by mouth daily.   amLODipine 10 MG tablet Commonly known as:  NORVASC Take 1 tablet (10 mg total) by mouth daily.   aspirin 81 MG tablet Take 81 mg by mouth daily.   atorvastatin 20 MG tablet Commonly known as:  LIPITOR Take 1 tablet (20 mg total) by mouth daily.   CENTRUM SILVER PO Take 1 tablet by mouth daily.   esomeprazole 40 MG capsule Commonly known as:  NEXIUM Take 40 mg by mouth daily at 12 noon.   gabapentin 100 MG capsule Commonly known as:  NEURONTIN Take 1 capsule (100 mg total) by mouth 3 (three) times daily.   loperamide 2 MG capsule Commonly known as:  IMODIUM Take 1 capsule (2 mg total) by mouth 4 (four) times daily as needed for diarrhea or loose stools.   metoprolol tartrate 25 MG tablet Commonly known as:  LOPRESSOR Take 1 tablet (25 mg total) by mouth daily.   ondansetron 4 MG disintegrating tablet Commonly known as:  ZOFRAN-ODT Take 1 tablet (4 mg total) by mouth every 8 (eight) hours as needed for nausea or vomiting.   sertraline 25 MG tablet Commonly known as:  ZOLOFT Take 2 tablets (50 mg total) by mouth daily.   valsartan 160 MG tablet Commonly known as:  DIOVAN Take 1 tablet (160 mg total) by mouth daily.   vitamin B-12 1000 MCG tablet Commonly known as:  CYANOCOBALAMIN Take 1,000 mcg by mouth daily.   Vitamin D3 1000 units Caps Take 1 capsule by mouth daily.          Objective:   Physical Exam BP (!) 126/58 (BP Location: Right Arm, Patient Position: Sitting, Cuff Size: Small)   Pulse 86   Temp 98 F (36.7 C) (Oral)   Resp 14   Ht 5' 2"  (1.575 m)   Wt 131 lb 6 oz (59.6 kg)   SpO2 98%   BMI 24.03 kg/m  General:   Well developed, elderly lady, looks fatigued but in no distress. HEENT:  Normocephalic . Face symmetric, atraumatic Lungs:  CTA B Normal respiratory effort,  no intercostal retractions, no accessory muscle use. O2 sat was 88 when she arrived to the office, I recheck it I was  98 on room air. Heart: RRR,  no murmur.  no pretibial edema bilaterally  Abdomen:  Not distended, soft, slightly tender at the upper abdomen bilaterally but no mass or rebound. Skin: Not pale. Not jaundice Neurologic:  alert & oriented X3.  Speech normal, gait appropriate for age and unassisted Psych--  Cognition and judgment appear intact.  Cooperative with normal attention span and concentration.  Behavior appropriate. No anxious or depressed appearing.     Assessment & Plan:   Assessment  DM- neuropathy (paresthesias, nl pinprick exam), CKD HTN Hyperlipidemia: Lipitor intolerant? CAD? States see Dr Terrence Dupont, no OV  records. 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 Patient reports on and off diarrhea for a while, definitely worse lately.  Stool studies ordered by me last week came back negative.  Went to the ER twice, she had stable anemia and decreased renal function.  CT abdomen no acute. On chart review she has a normal B12, B1 and TSH. We did orthostatic vital signs on her and her BP dropped significantly when she stood up. At this point, she feels extremely weak, she is orthostatic, sometimes she feels like she is going to faint.  Lying down: 135 /56 pulse 59 Standing 101/49, pulse 64  I think the best next step would be to send her to the ER, give her some IV fluids, reassess how she is feeling on either send her home or keep her for further treatment. I spoke with the ER doctor who  agrees to see the patient. Depression: Not clear if that is playing a role in how she feels Upper back pain: Chronic issue, has a history of breast cancer, last spine x-ray was January 2019 and she had no acute findings.  We will reassess the situation when she comes back. Cellulitis, left arm: She has chronic edema on that arm, arm looks back to normal.   F2F 25 min plus (reviewing the chart and coordinating her care)

## 2018-06-20 NOTE — ED Provider Notes (Signed)
Medical screening examination/treatment/procedure(s) were conducted as a shared visit with non-physician practitioner(s) and myself.  I personally evaluated the patient during the encounter.  82 year old female sent from primary care office secondary to likely dehydration.  Patient apparently has had some diarrhea for the last week or week and a half.  She just took loperamide a couple days ago it stopped the diarrhea but she continues to feel unwell.  She has decreased appetite.  She was seen upstairs by Dr. Laurine Blazer and they did orthostatics which were positive with a blood pressure going from 130s to low 100s.  Patient also feels worse with movement.  On exam she does appear dry with sunken eyes, skin tenting and dry mouth.  Would suggest checking basic labs and fluid hydrating to see if we get her feel better. Near syncope workup otherwise.   None     Huntley Knoop, Corene Cornea, MD 06/21/18 2122

## 2018-06-20 NOTE — ED Notes (Signed)
Denies nausea after fluid intake.

## 2018-06-20 NOTE — ED Notes (Signed)
Patient called her son to pick her up.  Pt wanted me to talk to her son.  I informed her son, Marcello Fennel, that at this time, I don't have any order to discharge his mom.  EDP notified of this conversation.    EDP informed that pt refused Zofran.  Denies nausea after PO intake.

## 2018-06-20 NOTE — ED Notes (Signed)
Report given to Leretha Dykes, Therapist, sports at [facility].

## 2018-06-21 DIAGNOSIS — R531 Weakness: Secondary | ICD-10-CM | POA: Diagnosis not present

## 2018-06-21 DIAGNOSIS — F329 Major depressive disorder, single episode, unspecified: Secondary | ICD-10-CM

## 2018-06-21 DIAGNOSIS — I10 Essential (primary) hypertension: Secondary | ICD-10-CM

## 2018-06-21 DIAGNOSIS — E871 Hypo-osmolality and hyponatremia: Secondary | ICD-10-CM | POA: Diagnosis present

## 2018-06-21 DIAGNOSIS — N179 Acute kidney failure, unspecified: Secondary | ICD-10-CM

## 2018-06-21 DIAGNOSIS — E860 Dehydration: Secondary | ICD-10-CM | POA: Diagnosis not present

## 2018-06-21 LAB — BASIC METABOLIC PANEL
Anion gap: 7 (ref 5–15)
BUN: 52 mg/dL — ABNORMAL HIGH (ref 8–23)
CO2: 22 mmol/L (ref 22–32)
Calcium: 7.9 mg/dL — ABNORMAL LOW (ref 8.9–10.3)
Chloride: 108 mmol/L (ref 98–111)
Creatinine, Ser: 1.71 mg/dL — ABNORMAL HIGH (ref 0.44–1.00)
GFR calc Af Amer: 32 mL/min — ABNORMAL LOW (ref >60)
GFR, EST NON AFRICAN AMERICAN: 27 mL/min — AB (ref >60)
GLUCOSE: 111 mg/dL — AB (ref 70–99)
POTASSIUM: 5 mmol/L (ref 3.5–5.1)
Sodium: 137 mmol/L (ref 135–145)

## 2018-06-21 LAB — CBC
HCT: 25.8 % — ABNORMAL LOW (ref 36.0–46.0)
Hemoglobin: 8.9 g/dL — ABNORMAL LOW (ref 12.0–15.0)
MCH: 31.9 pg (ref 26.0–34.0)
MCHC: 34.5 g/dL (ref 30.0–36.0)
MCV: 92.5 fL (ref 78.0–100.0)
PLATELETS: 227 10*3/uL (ref 150–400)
RBC: 2.79 MIL/uL — AB (ref 3.87–5.11)
RDW: 15.1 % (ref 11.5–15.5)
WBC: 5.8 10*3/uL (ref 4.0–10.5)

## 2018-06-21 MED ORDER — METOPROLOL TARTRATE 25 MG PO TABS: 25.0000 mg | ORAL_TABLET | Freq: Two times a day (BID) | ORAL | Status: DC

## 2018-06-21 MED ORDER — GABAPENTIN 100 MG PO CAPS: 200.0000 mg | ORAL_CAPSULE | Freq: Every day | ORAL | Status: DC

## 2018-06-21 MED ORDER — SODIUM CHLORIDE 0.9 % IV SOLN
2.0000 g | Freq: Once | INTRAVENOUS | Status: AC
  Administered 2018-06-21: 2 g via INTRAVENOUS
  Filled 2018-06-21: qty 20

## 2018-06-21 NOTE — Care Management Obs Status (Signed)
[facility] NOTIFICATION   Patient Details  Name: Samantha Mckenzie MRN: [id number] Date of Birth: December 22, 1935   Medicare Observation Status Notification Given:  Yes    Purcell Mouton, RN 06/21/2018, 2:18 PM

## 2018-06-21 NOTE — Evaluation (Signed)
Physical Therapy One Time Evaluation Patient Details Name: Samantha Mckenzie MRN: [id number] DOB: 10-16-1935 Today's Date: 06/21/2018   History of Present Illness  82 y.o. female with medical history significant of prior breast cancer (status post resection, chemo, radiation with residual lymphedema), HTN, HLD, chronic kidney disease stage IV followed by Dr. Clover Mealy, anemia, and depression and presents to ED with weakness and fatigue and admitted for work up of Generalized weakness, presyncope  Clinical Impression  Patient evaluated by Physical Therapy with no further acute PT needs identified. All education has been completed and the patient has no further questions.  Pt ambulated around unit and denies any symptoms today.  Pt reports she has been fatigued lately and napping a lot.  Pt also reports poor appetite prior to admission however states she has been hungry today.  See below for any follow-up Physical Therapy or equipment needs. PT is signing off. Thank you for this referral.     Follow Up Recommendations No PT follow up    Equipment Recommendations  None recommended by PT    Recommendations for Other Services       Precautions / Restrictions Precautions Precautions: Fall      Mobility  Bed Mobility Overal bed mobility: Modified Independent                Transfers Overall transfer level: Needs assistance Equipment used: None Transfers: Sit to/from Stand Sit to Stand: Supervision         General transfer comment: supervision for safety  Ambulation/Gait Ambulation/Gait assistance: Min guard;Supervision Gait Distance (Feet): 400 Feet Assistive device: None Gait Pattern/deviations: Step-through pattern;Decreased stride length     General Gait Details: slow but steady pace, pt denies any symptoms, no overt LOB observed  Stairs            Wheelchair Mobility    Modified Rankin (Stroke Patients Only)       Balance Overall balance assessment:  No apparent balance deficits (not formally assessed)(pt denies any recent falls)                                           Pertinent Vitals/Pain Pain Assessment: No/denies pain    Home Living Family/patient expects to be discharged to:: Private residence Living Arrangements: Spouse/significant other   Type of Home: House Home Access: Stairs to enter   Technical brewer of Steps: 2-3 Home Layout: One level Home Equipment: None      Prior Function Level of Independence: Independent               Hand Dominance        Extremity/Trunk Assessment        Lower Extremity Assessment Lower Extremity Assessment: (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 txs)    Cervical / Trunk Assessment Cervical / Trunk Assessment: Normal  Communication   Communication: No difficulties  Cognition Arousal/Alertness: Awake/alert Behavior During Therapy: WFL for tasks assessed/performed Overall Cognitive Status: Within Functional Limits for tasks assessed                                        General Comments      Exercises     Assessment/Plan    PT Assessment Patent does not need any further PT services  PT Problem List  PT Treatment Interventions      PT Goals (Current goals can be found in the Care Plan section)  Acute Rehab PT Goals PT Goal Formulation: All assessment and education complete, DC therapy    Frequency     Barriers to discharge        Co-evaluation               AM-PAC PT "6 Clicks" Daily Activity  Outcome Measure Difficulty turning over in bed (including adjusting bedclothes, sheets and blankets)?: None Difficulty moving from lying on back to sitting on the side of the bed? : None Difficulty sitting down on and standing up from a chair with arms (e.g., wheelchair, bedside commode, etc,.)?: None Help needed moving to and from a bed to chair (including a wheelchair)?: A Little Help needed  walking in hospital room?: A Little Help needed climbing 3-5 steps with a railing? : A Little 6 Click Score: 21    End of Session Equipment Utilized During Treatment: Gait belt Activity Tolerance: Patient tolerated treatment well Patient left: in chair;with call bell/phone within reach Nurse Communication: Mobility status(requested nursing ambulate with pt during acute stay) PT Visit Diagnosis: Difficulty in walking, not elsewhere classified (R26.2)    Time: 0938-1829 PT Time Calculation (min) (ACUTE ONLY): 12 min   Charges:   PT Evaluation $PT Eval Low Complexity: Metzger, PT, DPT Acute Rehabilitation Services Office: [phone] Pager: [phone]  Trena Platt 06/21/2018, 1:18 PM

## 2018-06-21 NOTE — Discharge Summary (Signed)
Physician Discharge Summary  [name]  YBW:[id number]  DOB: Apr 25, 1936  DOA: 06/20/2018 PCP: Colon Branch, MD  Admit date: 06/20/2018 Discharge date: 06/21/2018  Admitted From: Home  Disposition: Home   Recommendations for Outpatient Follow-up:  1. Follow up with PCP in 1-2 weeks 2. Follow up with nephrology in 1 to 2 weeks 3. Please obtain BMP/CBC in one week to monitor renal function and hemoglobin 4. Hold valsartan until seen by PCP  Discharge Condition: Stable   CODE STATUS: Full Code  Diet recommendation: Heart Healthy    Brief/Interim Summary: For full details see H&P/Progress note, but in brief, Samantha Mckenzie is a 82 year old female with medical history significant for prior breast cancer, hypertension, hyperlipidemia, CKD stage IV, anemia and depression who presented to the emergency department complaining of weakness and fatigue.  Patient had diarrhea for 2 to 3 weeks prior to admission upon ED evaluation lab work-up revealed elevated creatinine to 2.27, sodium of 130, low magnesium at 1.6 and hemoglobin of 10.8.  UA was negative for signs of infection.  Patient was admitted with working diagnosis for AKI due to diarrhea.   Subjective: Patient seen and examined, she reports feeling better.  Work with PT with no acute issues.  Creatinine significantly improved.   Discharge Diagnoses/Hospital Course:  Principal Problem:   Generalized weakness Active Problems:   DM II (diabetes mellitus, type II), controlled (HCC)   Hyperlipidemia   Essential hypertension   Depression   Hyponatremia   Hypomagnesemia  Generalized weakness in setting of severe dehydration Patient with history of CKD stage IV, develop with diarrhea developing AKI leading to weakness. Patient was hydrated and feeling improved.  Normal orthostatic vital signs.  No events on telemetry monitor.  PT work with patient and no outpatient recommendations were made.  Acute kidney injury on CKD stage IV Due  to diarrhea, upon admission creatinine 2.7, after IV hydration creatinine down to 1.5 Avoid nephrotoxic agent, holding ARB until seen by PCP or renal Encourage oral hydration and monitor renal function in 1 week  Diarrhea resolved after couple of doses of Imodium  Hypomagnesemia Repleted, check magnesium in 1 week  Anemia of chronic disease, likely related to CKD.  Slight drop in hemoglobin likely from hemo-dilution.  No signs of overt bleeding.  Check CBC in 1 week  Essential hypertension BP stable during hospital stay Continue amlodipine and metoprolol  Depression Felt that weakness and nice fatigue is associated to depressive symptoms.  Advised to follow-up with PCP for medication adjustment.  On the day of the discharge the patient's vitals were stable, and no other acute medical condition were reported by patient. the patient was felt safe to be discharge to home.   Discharge Instructions  You were cared for by a hospitalist during your hospital stay. If you have any questions about your discharge medications or the care you received while you were in the hospital after you are discharged, you can call the unit and asked to speak with the hospitalist on call if the hospitalist that took care of you is not available. Once you are discharged, your primary care physician will handle any further medical issues. Please note that NO REFILLS for any discharge medications will be authorized once you are discharged, as it is imperative that you return to your primary care physician (or establish a relationship with a primary care physician if you do not have one) for your aftercare needs so that they can reassess your need for medications  and monitor your lab values.  Discharge Instructions    Call MD for:  difficulty breathing, headache or visual disturbances   Complete by:  As directed    Call MD for:  extreme fatigue   Complete by:  As directed    Call MD for:  hives   Complete by:  As  directed    Call MD for:  persistant dizziness or light-headedness   Complete by:  As directed    Call MD for:  persistant nausea and vomiting   Complete by:  As directed    Call MD for:  redness, tenderness, or signs of infection (pain, swelling, redness, odor or green/yellow discharge around incision site)   Complete by:  As directed    Call MD for:  severe uncontrolled pain   Complete by:  As directed    Call MD for:  temperature >100.4   Complete by:  As directed    Diet - low sodium heart healthy   Complete by:  As directed    Increase activity slowly   Complete by:  As directed      Allergies as of 06/21/2018      Reactions   Codeine Nausea And Vomiting   Morphine Nausea And Vomiting   Oxycodone-acetaminophen Nausea And Vomiting   Sulfonamide Derivatives Nausea And Vomiting   Tramadol Hcl Nausea And Vomiting      Medication List    STOP taking these medications   loperamide 2 MG capsule Commonly known as:  IMODIUM   valsartan 160 MG tablet Commonly known as:  DIOVAN     TAKE these medications   allopurinol 100 MG tablet Commonly known as:  ZYLOPRIM Take 1 tablet (100 mg total) by mouth daily.   amLODipine 10 MG tablet Commonly known as:  NORVASC Take 1 tablet (10 mg total) by mouth daily.   aspirin 81 MG tablet Take 81 mg by mouth daily.   atorvastatin 20 MG tablet Commonly known as:  LIPITOR Take 1 tablet (20 mg total) by mouth daily.   CENTRUM SILVER PO Take 1 tablet by mouth daily.   gabapentin 100 MG capsule Commonly known as:  NEURONTIN Take 2 capsules (200 mg total) by mouth at bedtime.   metoprolol tartrate 25 MG tablet Commonly known as:  LOPRESSOR Take 1 tablet (25 mg total) by mouth 2 (two) times daily.   ondansetron 4 MG disintegrating tablet Commonly known as:  ZOFRAN-ODT Take 1 tablet (4 mg total) by mouth every 8 (eight) hours as needed for nausea or vomiting.   sertraline 25 MG tablet Commonly known as:  ZOLOFT Take 2 tablets  (50 mg total) by mouth daily.   vitamin B-12 1000 MCG tablet Commonly known as:  CYANOCOBALAMIN Take 1,000 mcg by mouth daily.   Vitamin D3 1000 units Caps Take 1 capsule by mouth daily.       Allergies  Allergen Reactions  . Codeine Nausea And Vomiting  . Morphine Nausea And Vomiting  . Oxycodone-Acetaminophen Nausea And Vomiting  . Sulfonamide Derivatives Nausea And Vomiting  . Tramadol Hcl Nausea And Vomiting    Consultations:     Procedures/Studies: Ct Abdomen Pelvis Wo Contrast  Result Date: 06/20/2018 CLINICAL DATA:  Diarrhea for 2 weeks EXAM: CT ABDOMEN AND PELVIS WITHOUT CONTRAST TECHNIQUE: Multidetector CT imaging of the abdomen and pelvis was performed following the standard protocol without IV contrast. COMPARISON:  06/16/2018 FINDINGS: Lower chest: Dependent atelectasis. Hepatobiliary: Unremarkable Pancreas: Unremarkable Spleen: Calcified granulomata. Adrenals/Urinary Tract: Stable hypodensities in  the kidneys. Moderate atrophy of the cortex. Adrenal glands are unremarkable. Bladder is distended. Stomach/Bowel: Appendix is nonvisualized. No disproportionate dilatation of bowel. Air-fluid levels are scattered throughout both small and large bowel. No obvious mass in the colon. Sigmoid diverticulosis without evidence of acute diverticulitis. Stomach is decompressed. Vascular/Lymphatic: Atherosclerotic calcifications of the aorta and iliac arteries. No abnormal retroperitoneal adenopathy. Reproductive: Small calcification in the fundus of the uterus may simply represent a calcified fibroid. Adnexa are unremarkable. Other: No free fluid. Musculoskeletal: No vertebral compression deformity. Lumbosacral junction is transitional. Small ribs at T12. L5 is sacralized. There is anterolisthesis L4 upon L5 associated with advanced facet arthropathy. IMPRESSION: No acute intra-abdominal or intrapelvic process. Bladder distention. Sigmoid diverticulosis without acute diverticulitis.  Electronically Signed   By: Marybelle Killings M.D.   On: 06/20/2018 15:03   Ct Abdomen Pelvis Wo Contrast  Result Date: 06/16/2018 CLINICAL DATA:  Periumbilical abdominal pain, nausea, unable to keep anything down for 1 month, question diverticulitis, history hypertension, type II diabetes mellitus EXAM: CT ABDOMEN AND PELVIS WITHOUT CONTRAST TECHNIQUE: Multidetector CT imaging of the abdomen and pelvis was performed following the standard protocol without IV contrast. Sagittal and coronal MPR images reconstructed from axial data set. COMPARISON:  12/29/2014 FINDINGS: Lower chest: Dependent atelectasis RIGHT lower lobe Hepatobiliary: Gallbladder and liver normal appearance Pancreas: Atrophic pancreas without mass Spleen: Calcified granulomata in the spleen.  No focal mass lesion. Adrenals/Urinary Tract: Adrenal glands normal appearance. Atrophic kidneys bilaterally. BILATERAL prominent extrarenal pelves. Exophytic low-attenuation lesion lateral RIGHT kidney 2.2 x 2.1 cm consistent with a cyst. No additional renal masses. Decompressed ureters without calcification. Bladder well distended and unremarkable. Stomach/Bowel: Sigmoid diverticulosis without evidence of diverticulitis. Normal appendix. Stomach and bowel loops otherwise normal appearance. Vascular/Lymphatic: Atherosclerotic calcifications aorta and iliac arteries without aneurysm. No adenopathy. Reproductive: Atrophic uterus and adnexa Other: No free air or free fluid. No inflammatory process. No hernia. Musculoskeletal: Osseous demineralization. Grade 1 anterolisthesis L4-L5 with mildly bulging disc. IMPRESSION: Distal colonic diverticulosis without evidence of diverticulitis. No acute intra-abdominal or intrapelvic abnormalities. Aortic Atherosclerosis (ICD10-I70.0). Electronically Signed   By: Lavonia Dana M.D.   On: 06/16/2018 13:50   US Venous Img Upper Uni Left  Result Date: 06/05/2018 CLINICAL DATA:  Left upper extremity swelling. EXAM: LEFT UPPER  EXTREMITY VENOUS DOPPLER ULTRASOUND TECHNIQUE: Gray-scale sonography with graded compression, as well as color Doppler and duplex ultrasound were performed to evaluate the upper extremity deep venous system from the level of the subclavian vein and including the jugular, axillary, basilic, radial, ulnar and upper cephalic vein. Spectral Doppler was utilized to evaluate flow at rest and with distal augmentation maneuvers. COMPARISON:  None. FINDINGS: Contralateral Subclavian Vein: Respiratory phasicity is normal and symmetric with the symptomatic side. No evidence of thrombus. Normal compressibility. Internal Jugular Vein: No evidence of thrombus. Normal compressibility, respiratory phasicity and response to augmentation. Subclavian Vein: No evidence of thrombus. Normal compressibility, respiratory phasicity and response to augmentation. Axillary Vein: No evidence of thrombus. Normal compressibility, respiratory phasicity and response to augmentation. Cephalic Vein: No evidence of thrombus. Normal compressibility, respiratory phasicity and response to augmentation. Basilic Vein: No evidence of thrombus. Normal compressibility, respiratory phasicity and response to augmentation. Brachial Veins: No evidence of thrombus. Normal compressibility, respiratory phasicity and response to augmentation. Radial Veins: No evidence of thrombus. Normal compressibility, respiratory phasicity and response to augmentation. Ulnar Veins: No evidence of thrombus. Normal compressibility, respiratory phasicity and response to augmentation. Venous Reflux:  None visualized. Other Findings:  None visualized. IMPRESSION: No  evidence of DVT within the left upper extremity. Electronically Signed   By: Rolm Baptise M.D.   On: 06/05/2018 21:48    Discharge Exam: Vitals:   06/21/18 0903 06/21/18 1330  BP: (!) 133/54 (!) 128/46  Pulse: 63 (!) 59  Resp:  18  Temp:  98.2 F (36.8 C)  SpO2:  99%   Vitals:   06/21/18 0509 06/21/18 0851  06/21/18 0903 06/21/18 1330  BP: (!) 110/54  (!) 133/54 (!) 128/46  Pulse: 61  63 (!) 59  Resp: 18   18  Temp: 98.8 F (37.1 C) 99.3 F (37.4 C)  98.2 F (36.8 C)  TempSrc: Oral Oral  Oral  SpO2: 98% 97%  99%  Weight:      Height:        General: NAD  Cardiovascular: RRR, S1/S2 +, no rubs, no gallops Respiratory: CTA bilaterally, no wheezing, no rhonchi Abdominal: Soft, NT, ND, bowel sounds + Extremities: No LE edema    The results of significant diagnostics from this hospitalization (including imaging, microbiology, ancillary and laboratory) are listed below for reference.     Microbiology: Recent Results (from the past 240 hour(s))  Stool Culture     Status: None   Collection Time: 06/14/18 10:16 AM  Result Value Ref Range Status   MICRO NUMBER: [id number]  Final   SPECIMEN QUALITY: ADEQUATE  Final   SOURCE: STOOL  Final   STATUS: FINAL  Final   SHIGA RESULT: Not Detected  Final   MICRO NUMBER: [id number]  Final   SPECIMEN QUALITY: ADEQUATE  Final   Source STOOL  Final   STATUS: FINAL  Final   CAM RESULT: No enteric Campylobacter isolated  Final   MICRO NUMBER: [id number]  Final   SPECIMEN QUALITY: ADEQUATE  Final   SOURCE: STOOL  Final   STATUS: FINAL  Final   SS RESULT: No Salmonella or Shigella isolated  Final     Labs: BNP (last 3 results) No results for input(s): BNP in the last 8760 hours. Basic Metabolic Panel: Recent Labs  Lab 06/15/18 1130 06/16/18 0959 06/20/18 1235 06/21/18 0411  NA 134* 133* 130* 137  K 4.6 4.4 4.7 5.0  CL 104 104 96* 108  CO2 20* 22 22 22   GLUCOSE 114* 116* 109* 111*  BUN 65* 54* 65* 52*  CREATININE 1.96* 1.94* 2.27* 1.71*  CALCIUM 8.2* 8.4* 8.1* 7.9*  MG  --   --  1.6*  --    Liver Function Tests: Recent Labs  Lab 06/15/18 1130 06/16/18 0959 06/20/18 1235  AST 28 30 33  ALT 19 20 23   ALKPHOS 67 72 87  BILITOT 0.7 0.9 0.6  PROT 7.2 7.1 6.9  ALBUMIN 3.3* 3.2* 3.1*   Recent Labs  Lab 06/15/18 1130 06/16/18 0959  06/20/18 1235  LIPASE 77* 80* 109*   No results for input(s): AMMONIA in the last 168 hours. CBC: Recent Labs  Lab 06/15/18 0952 06/16/18 0959 06/20/18 1235 06/21/18 0411  WBC 6.4 7.9 6.9 5.8  NEUTROABS 4.6 5.8 4.7  --   HGB 10.5* 10.7* 10.8* 8.9*  HCT 30.3* 32.6* 31.0* 25.8*  MCV 92.1 95.0 91.4 92.5  PLT 255 235 235 227   Cardiac Enzymes: Recent Labs  Lab 06/20/18 2129  TROPONINI <0.03   BNP: Invalid input(s): POCBNP CBG: No results for input(s): GLUCAP in the last 168 hours. D-Dimer No results for input(s): DDIMER in the last 72 hours. Hgb A1c No results for input(s): HGBA1C in the  last 72 hours. Lipid Profile No results for input(s): CHOL, HDL, LDLCALC, TRIG, CHOLHDL, LDLDIRECT in the last 72 hours. Thyroid function studies No results for input(s): TSH, T4TOTAL, T3FREE, THYROIDAB in the last 72 hours.  Invalid input(s): FREET3 Anemia work up No results for input(s): VITAMINB12, FOLATE, FERRITIN, TIBC, IRON, RETICCTPCT in the last 72 hours. Urinalysis    Component Value Date/Time   COLORURINE YELLOW 06/20/2018 1522   APPEARANCEUR CLEAR 06/20/2018 1522   LABSPEC <1.005 (L) 06/20/2018 1522   PHURINE 5.5 06/20/2018 1522   GLUCOSEU NEGATIVE 06/20/2018 1522   HGBUR NEGATIVE 06/20/2018 1522   BILIRUBINUR NEGATIVE 06/20/2018 1522   KETONESUR NEGATIVE 06/20/2018 1522   PROTEINUR NEGATIVE 06/20/2018 1522   UROBILINOGEN 0.2 12/29/2014 1400   NITRITE NEGATIVE 06/20/2018 1522   LEUKOCYTESUR NEGATIVE 06/20/2018 1522   Sepsis Labs Invalid input(s): PROCALCITONIN,  WBC,  LACTICIDVEN Microbiology Recent Results (from the past 240 hour(s))  Stool Culture     Status: None   Collection Time: 06/14/18 10:16 AM  Result Value Ref Range Status   MICRO NUMBER: [id number]  Final   SPECIMEN QUALITY: ADEQUATE  Final   SOURCE: STOOL  Final   STATUS: FINAL  Final   SHIGA RESULT: Not Detected  Final   MICRO NUMBER: [id number]  Final   SPECIMEN QUALITY: ADEQUATE  Final   Source  STOOL  Final   STATUS: FINAL  Final   CAM RESULT: No enteric Campylobacter isolated  Final   MICRO NUMBER: [id number]  Final   SPECIMEN QUALITY: ADEQUATE  Final   SOURCE: STOOL  Final   STATUS: FINAL  Final   SS RESULT: No Salmonella or Shigella isolated  Final   Time coordinating discharge: 32 minutes  SIGNED:  Chipper Oman, MD  [facility] 06/21/2018, 1:51 PM  Pager please text page via  [email or web address]  Note - This record has been created using [employer]. Chart creation errors have been sought, but may not always have been located. Such creation errors do not reflect on the standard of medical care.

## 2018-06-21 NOTE — Progress Notes (Signed)
Patient discharged home with son, discharge instructions given and explained to patient and she verbalized understanding, denies any pain/distress. Skin intact, no wound noted.  Accompanied home by son.

## 2018-06-21 NOTE — Progress Notes (Signed)
Patient discharged, discharge instructions given. waiting for transportation-son,

## 2018-06-22 ENCOUNTER — Telehealth: Payer: Self-pay

## 2018-06-22 NOTE — Telephone Encounter (Signed)
thx

## 2018-06-22 NOTE — Telephone Encounter (Signed)
Patient would like a call back fro [facility] to discuss medication changes and her hospital follow up appointment.

## 2018-06-22 NOTE — Telephone Encounter (Signed)
06/22/18   Transition Care Management Follow-up Telephone Call  ADMISSION DATE: 06/20/18  DISCHARGE DATE: 06/21/18  How have you been since you were released from the hospital? Weakness with back pain.   Do you understand why you were in the hospital? Yes   Do you understand the discharge instrcutions? Yes    Items Reviewed:  Medications reviewed:  Yes  Allergies reviewed: Yes   Dietary changes reviewed: Heart healthy   Referrals reviewed: Nephrology scheduled. Follow up with Dr. Larose Kells scheduled.   Functional Questionnaire:  Activities of Daily Living (ADLs): Patient states she can perform all ADL'S independently.  Any patient concerns? Back pain continues.   Confirmed importance and date/time of follow-up visits scheduled: Yes   Confirmed with patient if condition begins to worsen call PCP or go to the ER. Yes   Patient was given the office number and encouragred to call back with questions or concerns. Yes

## 2018-06-26 NOTE — Telephone Encounter (Signed)
Patient has a hospital follow-up with me tomorrow, will discuss her hospital results then.  If there is anything urgently let me know

## 2018-06-27 ENCOUNTER — Ambulatory Visit (INDEPENDENT_AMBULATORY_CARE_PROVIDER_SITE_OTHER): Payer: Medicare Other | Admitting: Internal Medicine

## 2018-06-27 ENCOUNTER — Encounter: Payer: Self-pay | Admitting: Internal Medicine

## 2018-06-27 ENCOUNTER — Other Ambulatory Visit: Payer: Medicare Other

## 2018-06-27 VITALS — BP 118/60 | HR 59 | Temp 97.8°F | Resp 16 | Ht 62.0 in | Wt 131.1 lb

## 2018-06-27 DIAGNOSIS — E785 Hyperlipidemia, unspecified: Secondary | ICD-10-CM | POA: Diagnosis not present

## 2018-06-27 DIAGNOSIS — R197 Diarrhea, unspecified: Secondary | ICD-10-CM

## 2018-06-27 DIAGNOSIS — E1122 Type 2 diabetes mellitus with diabetic chronic kidney disease: Secondary | ICD-10-CM | POA: Diagnosis not present

## 2018-06-27 DIAGNOSIS — I10 Essential (primary) hypertension: Secondary | ICD-10-CM

## 2018-06-27 DIAGNOSIS — N182 Chronic kidney disease, stage 2 (mild): Secondary | ICD-10-CM

## 2018-06-27 DIAGNOSIS — D649 Anemia, unspecified: Secondary | ICD-10-CM | POA: Diagnosis not present

## 2018-06-27 DIAGNOSIS — R531 Weakness: Secondary | ICD-10-CM | POA: Diagnosis not present

## 2018-06-27 DIAGNOSIS — M549 Dorsalgia, unspecified: Secondary | ICD-10-CM

## 2018-06-27 LAB — CBC WITH DIFFERENTIAL/PLATELET
BASOS ABS: 0 10*3/uL (ref 0.0–0.1)
BASOS PCT: 0.6 % (ref 0.0–3.0)
Eosinophils Absolute: 0.1 10*3/uL (ref 0.0–0.7)
Eosinophils Relative: 1.1 % (ref 0.0–5.0)
HEMATOCRIT: 29.4 % — AB (ref 36.0–46.0)
Hemoglobin: 9.8 g/dL — ABNORMAL LOW (ref 12.0–15.0)
LYMPHS PCT: 23.6 % (ref 12.0–46.0)
Lymphs Abs: 1.4 10*3/uL (ref 0.7–4.0)
MCHC: 33.4 g/dL (ref 30.0–36.0)
MCV: 95.3 fl (ref 78.0–100.0)
MONOS PCT: 7.2 % (ref 3.0–12.0)
Monocytes Absolute: 0.4 10*3/uL (ref 0.1–1.0)
NEUTROS ABS: 4.1 10*3/uL (ref 1.4–7.7)
Neutrophils Relative %: 67.5 % (ref 43.0–77.0)
PLATELETS: 222 10*3/uL (ref 150.0–400.0)
RBC: 3.09 Mil/uL — ABNORMAL LOW (ref 3.87–5.11)
RDW: 16.4 % — ABNORMAL HIGH (ref 11.5–15.5)
WBC: 6.1 10*3/uL (ref 4.0–10.5)

## 2018-06-27 LAB — LIPID PANEL
CHOL/HDL RATIO: 3
Cholesterol: 125 mg/dL (ref 0–200)
HDL: 42.6 mg/dL (ref >39.00)
LDL Cholesterol: 55 mg/dL (ref 0–99)
NONHDL: 82.62
Triglycerides: 140 mg/dL (ref 0.0–149.0)
VLDL: 28 mg/dL (ref 0.0–40.0)

## 2018-06-27 LAB — COMPREHENSIVE METABOLIC PANEL
ALT: 32 U/L (ref 0–35)
AST: 30 U/L (ref 0–37)
Albumin: 3.5 g/dL (ref 3.5–5.2)
Alkaline Phosphatase: 73 U/L (ref 39–117)
BUN: 52 mg/dL — AB (ref 6–23)
CALCIUM: 9.1 mg/dL (ref 8.4–10.5)
CHLORIDE: 102 meq/L (ref 96–112)
CO2: 25 meq/L (ref 19–32)
CREATININE: 2 mg/dL — AB (ref 0.40–1.20)
GFR: 25.49 mL/min — AB (ref >60.00)
Glucose, Bld: 112 mg/dL — ABNORMAL HIGH (ref 70–99)
Potassium: 5 mEq/L (ref 3.5–5.1)
Sodium: 136 mEq/L (ref 135–145)
Total Bilirubin: 0.3 mg/dL (ref 0.2–1.2)
Total Protein: 6.7 g/dL (ref 6.0–8.3)

## 2018-06-27 LAB — IRON: IRON: 35 ug/dL — AB (ref 42–145)

## 2018-06-27 LAB — HEMOGLOBIN A1C: Hgb A1c MFr Bld: 6.3 % (ref 4.6–6.5)

## 2018-06-27 NOTE — Progress Notes (Signed)
Subjective:    Patient ID: Samantha Mckenzie, female    DOB: November 23, 1935, 82 y.o.   MRN: [id number]  DOS:  06/27/2018 Type of visit - description : TCM 7 Interval history: Patient recently seen initially with diarrhea and subsequently with generalized weakness and worsening diarrhea. The last time she was here, she was noted to be orthostatic, was sent to the ER and subsequently admitted. Was discharged home 06/21/2018. She was diagnosed with generalized weekends in the setting of dehydration and increasing creatinine. Patient was hydrated and felt better. Creatinine decreased to 1.5. Diarrhea resolved after Imodium. Have low magnesium, needs to be rechecked. It was felt that depression was playing a role on  her symptoms. Last potassium 5.0, creatinine 1.7.  Hemoglobin 8.9.  CRP normal.  Sed rate 60 Abdomen and pelvic CT with no acute findings.  Review of Systems Since she left the hospital she is feeling about the same.  Still tired.  Very fatigued.  Denies exertional chest pain, shortness of breath or claudication. Continue with upper back pain Appetite has improved. Diarrhea is resolved. Denies headache, myalgias, fever or chills. Depression?  She does not think that that issue.  Still taking Zoloft.  Past Medical History:  Diagnosis Date  . Allergic rhinitis   . Anemia   . Breast CA ([place])    surgery, chemo, XRT; had peripheral neuropathy (imbalance at times) after chemo  . Cellulitis    Left arm, recurrent   . Chronic renal disease, stage IV (HCC)    Dr. Moshe Cipro  . Depression   . Fatigue   . GERD (gastroesophageal reflux disease)    gastritis, EGD 02/2007  . Gout   . Hyperlipidemia   . Hypertension   . Macular degeneration 2011  . Normal cardiac stress test 12/2014  . Osteoarthritis   . Renal insufficiency     Past Surgical History:  Procedure Laterality Date  . cataracts bilaterally  1 -2012  . LUNG BIOPSY  1999   neg  . MASTECTOMY Left 1999   and  lymphnodes     Social History   Socioeconomic History  . Marital status: Married    Spouse name: Not on file  . Number of children: 2  . Years of education: Not on file  . Highest education level: Not on file  Occupational History  . Occupation: retired     Fish farm manager: RETIRED  Social Needs  . Financial resource strain: Not on file  . Food insecurity:    Worry: Not on file    Inability: Not on file  . Transportation needs:    Medical: Not on file    Non-medical: Not on file  Tobacco Use  . Smoking status: Never Smoker  . Smokeless tobacco: Never Used  Substance and Sexual Activity  . Alcohol use: Yes    Alcohol/week: 0.0 standard drinks    Comment: rarely  . Drug use: No  . Sexual activity: Not on file  Lifestyle  . Physical activity:    Days per week: Not on file    Minutes per session: Not on file  . Stress: Not on file  Relationships  . Social connections:    Talks on phone: Not on file    Gets together: Not on file    Attends religious service: Not on file    Active member of club or organization: Not on file    Attends meetings of clubs or organizations: Not on file    Relationship status: Not on  file  . Intimate partner violence:    Fear of current or ex partner: Not on file    Emotionally abused: Not on file    Physically abused: Not on file    Forced sexual activity: Not on file  Other Topics Concern  . Not on file  Social History Narrative   Lives w/ husband , 2 children in [place], 2 [place] as of 06/27/2018      Reactions   Codeine Nausea And Vomiting   Morphine Nausea And Vomiting   Oxycodone-acetaminophen Nausea And Vomiting   Sulfonamide Derivatives Nausea And Vomiting   Tramadol Hcl Nausea And Vomiting      Medication List        Accurate as of 06/27/18 11:59 PM. Always use your most recent med list.          allopurinol 100 MG tablet Commonly known as:  ZYLOPRIM Take 1 tablet (100 mg total) by mouth daily.     amLODipine 10 MG tablet Commonly known as:  NORVASC Take 1 tablet (10 mg total) by mouth daily.   aspirin 81 MG tablet Take 81 mg by mouth daily.   atorvastatin 20 MG tablet Commonly known as:  LIPITOR Take 1 tablet (20 mg total) by mouth daily.   CENTRUM SILVER PO Take 1 tablet by mouth daily.   gabapentin 100 MG capsule Commonly known as:  NEURONTIN Take 2 capsules (200 mg total) by mouth at bedtime.   metoprolol tartrate 25 MG tablet Commonly known as:  LOPRESSOR Take 1 tablet (25 mg total) by mouth 2 (two) times daily.   ondansetron 4 MG disintegrating tablet Commonly known as:  ZOFRAN-ODT Take 1 tablet (4 mg total) by mouth every 8 (eight) hours as needed for nausea or vomiting.   sertraline 25 MG tablet Commonly known as:  ZOLOFT Take 2 tablets (50 mg total) by mouth daily.   vitamin B-12 1000 MCG tablet Commonly known as:  CYANOCOBALAMIN Take 1,000 mcg by mouth daily.   Vitamin D3 1000 units Caps Take 1 capsule by mouth daily.          Objective:   Physical Exam BP 118/60 (BP Location: Right Arm, Patient Position: Sitting, Cuff Size: Small)   Pulse (!) 59   Temp 97.8 F (36.6 C) (Oral)   Resp 16   Ht 5' 2"  (1.575 m)   Wt 131 lb 2 oz (59.5 kg)   SpO2 98%   BMI 23.98 kg/m  General:   Well developed, NAD, see BMI.  She looks much stronger today. HEENT:  Normocephalic . Face symmetric, atraumatic Lungs:  CTA B Normal respiratory effort, no intercostal retractions, no accessory muscle use. Heart: RRR,  no murmur.  no pretibial edema bilaterally  Abdomen:  Not distended, soft, non-tender. No rebound or rigidity.   Skin: Not pale. Not jaundice Neurologic:  alert & oriented X3.  Speech normal, gait appropriate for age and unassisted Psych--  Cognition and judgment appear intact.  Cooperative with normal attention span and concentration.  Behavior appropriate. No anxious or depressed appearing.     Assessment & Plan:   Assessment  DM-  neuropathy (paresthesias, nl pinprick exam), CKD HTN Hyperlipidemia: Lipitor intolerant? CAD? States see Dr Terrence Dupont, no OV  records. (-) stress test 12-2014 CKD -- sees nephrology Gout GERD, nl EGD 01-2015  H/o Depression Leg pain-- RLS vs neuropathy, on gabapentin since 2013, good results H/o anemia ---  no iron def , Cscope 2012, normal EGD 01-2015 Breast cancer:  --S/p Surgery (L mastectomy), chemotherapy, XRT --Peripheral neuropathy felt to be due to chemotherapy --Recurrent left arm cellulitis DJD  PLAN Generalized weakness, diarrhea: Since the last office visit, she was admitted to the hospital with dehydration.  Diarrhea has resolved, she continued to feel tired.  Work-up so far has been negative including sed rate of 60 which is not very high for her age, a CT of the abdomen with no acute findings. At this point I am not sure why she feels so tired, I still think there is some issues with depression and caregiver fatigue, she takes care of her husband who is having a toe amputated soon.  She does not think that is a issue. We are getting labs today. DM: Check a A1c HTN: Currently on amlodipine, metoprolol.  Off Diovan since the last admission to the hospital, creatinine was elevated.  Ambulatory BPs 118, 120, 140. rec monitor BPs, reintroduce low dose ARB if needed  Upper back pain: Still an issue, Tylenol offers little help, she is allergic to most painkillers.  We agreed on refer her to Dr. Marlou Sa, orthopedic surgery. Hyperlipidemia: Request a FLP will do CKD: See nephrology in few weeks Depression?  For now recommend to stay on Zoloft Anemia: Last hemoglobin at the hospital decreased, probably delusional, will check iron and ferritin. RTC 4 weeks

## 2018-06-27 NOTE — Patient Instructions (Addendum)
GO TO THE LAB : Get the blood work     GO TO THE FRONT DESK Schedule your next appointment for a  Check up in 4 weeks     Check the  blood pressure  daily Be sure your blood pressure is between 110/65 and  135/85. If it is consistently higher or lower, let me know

## 2018-06-27 NOTE — Progress Notes (Signed)
Pre visit review using our clinic review tool, if applicable. No additional management support is needed unless otherwise documented below in the visit note. 

## 2018-06-28 LAB — FERRITIN: Ferritin: 306.5 ng/mL — ABNORMAL HIGH (ref 10.0–291.0)

## 2018-06-28 NOTE — Assessment & Plan Note (Signed)
Generalized weakness, diarrhea: Since the last office visit, she was admitted to the hospital with dehydration.  Diarrhea has resolved, she continued to feel tired.  Work-up so far has been negative including sed rate of 60 which is not very high for her age, a CT of the abdomen with no acute findings. At this point I am not sure why she feels so tired, I still think there is some issues with depression and caregiver fatigue, she takes care of her husband who is having a toe amputated soon.  She does not think that is a issue. We are getting labs today. DM: Check a A1c HTN: Currently on amlodipine, metoprolol.  Off Diovan since the last admission to the hospital, creatinine was elevated.  Ambulatory BPs 118, 120, 140. rec monitor BPs, reintroduce low dose ARB if needed  Upper back pain: Still an issue, Tylenol offers little help, she is allergic to most painkillers.  We agreed on refer her to Dr. Marlou Sa, orthopedic surgery. Hyperlipidemia: Request a FLP will do CKD: See nephrology in few weeks Depression?  For now recommend to stay on Zoloft Anemia: Last hemoglobin at the hospital decreased, probably delusional, will check iron and ferritin. RTC 4 weeks

## 2018-06-29 NOTE — Addendum Note (Signed)
Addended byDamita Dunnings D on: 06/29/2018 07:54 AM   Modules accepted: Orders

## 2018-06-30 ENCOUNTER — Other Ambulatory Visit: Payer: Self-pay | Admitting: Medical

## 2018-07-11 ENCOUNTER — Ambulatory Visit: Payer: Medicare Other | Admitting: Internal Medicine

## 2018-07-11 ENCOUNTER — Other Ambulatory Visit: Payer: Self-pay

## 2018-07-11 DIAGNOSIS — E860 Dehydration: Secondary | ICD-10-CM | POA: Diagnosis not present

## 2018-07-11 DIAGNOSIS — E785 Hyperlipidemia, unspecified: Secondary | ICD-10-CM | POA: Diagnosis not present

## 2018-07-11 DIAGNOSIS — E119 Type 2 diabetes mellitus without complications: Secondary | ICD-10-CM | POA: Diagnosis not present

## 2018-07-11 DIAGNOSIS — R197 Diarrhea, unspecified: Secondary | ICD-10-CM | POA: Diagnosis not present

## 2018-07-11 DIAGNOSIS — D649 Anemia, unspecified: Secondary | ICD-10-CM | POA: Diagnosis not present

## 2018-07-11 DIAGNOSIS — I10 Essential (primary) hypertension: Secondary | ICD-10-CM | POA: Diagnosis not present

## 2018-07-11 DIAGNOSIS — I2510 Atherosclerotic heart disease of native coronary artery without angina pectoris: Secondary | ICD-10-CM | POA: Diagnosis not present

## 2018-07-11 DIAGNOSIS — N189 Chronic kidney disease, unspecified: Secondary | ICD-10-CM | POA: Diagnosis not present

## 2018-07-11 MED ORDER — METOPROLOL TARTRATE 25 MG PO TABS: 25.0000 mg | ORAL_TABLET | Freq: Two times a day (BID) | ORAL | 1 refills | Status: DC

## 2018-07-14 ENCOUNTER — Ambulatory Visit (INDEPENDENT_AMBULATORY_CARE_PROVIDER_SITE_OTHER): Payer: Medicare Other

## 2018-07-14 ENCOUNTER — Ambulatory Visit (INDEPENDENT_AMBULATORY_CARE_PROVIDER_SITE_OTHER): Payer: Medicare Other | Admitting: Orthopedic Surgery

## 2018-07-14 ENCOUNTER — Encounter (INDEPENDENT_AMBULATORY_CARE_PROVIDER_SITE_OTHER): Payer: Self-pay | Admitting: Orthopedic Surgery

## 2018-07-14 DIAGNOSIS — M546 Pain in thoracic spine: Secondary | ICD-10-CM

## 2018-07-14 DIAGNOSIS — M542 Cervicalgia: Secondary | ICD-10-CM

## 2018-07-14 NOTE — Progress Notes (Signed)
Office Visit Note   Patient: Samantha Mckenzie           Date of Birth: 1936/03/25           MRN: [id number] Visit Date: 07/14/2018 Requested by: Colon Branch, [address] [place], [place] [address] PCP: Colon Branch, MD  Subjective: Chief Complaint  Patient presents with  . Neck - Pain  . Middle Back - Pain    HPI: Samantha Mckenzie is a patient with upper back pain and lower neck pain for the past 2 months.  She reports pain on a daily basis and pain which causes her to have to stop activity rest and then the pain typically recurs when she resumes activity.  She reports some occasional numbness and tingling in the left arm.  She has had a history of breast cancer and has lymphedema in that left arm.  T-spine x-rays from prior studies appear unremarkable.  She does have a history of breast cancer on that left-hand side in 1999.  I have previously treated her for gout years ago.  She is on allopurinol and cannot take anti-inflammatories because of kidney issues.  She is tried pain patches which do not work.              ROS: All systems reviewed are negative as they relate to the chief complaint within the history of present illness.  Patient denies  fevers or chills.   Assessment & Plan: Visit Diagnoses:  1. Neck pain   2. Cervicalgia   3. Acute midline thoracic back pain     Plan: Impression is neck and upper thoracic pain with questionable radiculopathy and radiation to that left side.  She is failed conservative management and symptoms have been ongoing for several months.  She has daily pain which is been refractory to nonoperative treatment.  With her history of cancer my concern would be that this could represent some type of metastatic process.  Secondarily this may be stenosis or degenerative process.  Plan MRI cervical spine and thoracic spine to evaluate this shoulder blade type pain in a patient with a history of breast cancer.  I will see her back after that  study  Follow-Up Instructions: Return for after MRI.   Orders:  Orders Placed This Encounter  Procedures  . XR Cervical Spine 2 or 3 views  . MR Cervical Spine w/o contrast  . MR Thoracic Spine w/o contrast   No orders of the defined types were placed in this encounter.     Procedures: No procedures performed   Clinical Data: No additional findings.  Objective: Vital Signs: There were no vitals taken for this visit.  Physical Exam:   Constitutional: Patient appears well-developed HEENT:  Head: Normocephalic Eyes:EOM are normal Neck: Normal range of motion Cardiovascular: Normal rate Pulmonary/chest: Effort normal Neurologic: Patient is alert Skin: Skin is warm Psychiatric: Patient has normal mood and affect    Ortho Exam: Ortho exam demonstrates cervical spine extension to about 30 degrees flexion chin to chest rotation is about 50 degrees bilaterally motor or sensory function to both hands intact.  Radial pulse intact although there is some lymphedema in that left arm.  She has good biceps triceps and deltoid strength without any coarse grinding or crepitus in that shoulder region bilaterally.  No scapular dyskinesia with forward flexion.  Reflexes symmetric bilateral biceps triceps 1+ out of 4.  No rash in that neck region.  Specialty Comments:  No specialty comments available.  Imaging: Xr Cervical Spine 2 Or 3 Views  Result Date: 07/14/2018 AP lateral cervical spine shows mild arthritis at the C4-5 and C5-6 disc levels.  Slight spondylolisthesis at C7-T1.  No significant loss of lordosis.  Bone quality appears intact.  Fairly minimal facet arthritis present.    PMFS History: Patient Active Problem List   Diagnosis Date Noted  . Hyponatremia 06/21/2018  . Hypomagnesemia 06/21/2018  . Generalized weakness 06/20/2018  . PCP NOTES >>> 07/11/2015  . Myalgia and myositis 03/08/2014  . Dizziness and giddiness 05/06/2013  . Leg pain, RLS? neuropathy?  06/21/2012  . Cellulitis LEFT arm recurrent 12/08/2011  . Annual physical exam 06/22/2011  . Depression   . NECK PAIN 12/28/2010  . Fatigue 12/28/2010  . VITAMIN B12 DEFICIENCY 04/01/2008  . GERD 12/19/2007  . NEOP, MALIGNANT, FEMALE BREAST NOS 07/07/2007  . RENAL INSUFFICIENCY 07/07/2007  . Osteoarthritis 07/07/2007  . DM II (diabetes mellitus, type II), controlled ([facility]) 02/28/2007  . Hyperlipidemia 02/28/2007  . GOUT 02/28/2007  . ANEMIA-NOS 02/28/2007  . Essential hypertension 02/28/2007   Past Medical History:  Diagnosis Date  . Allergic rhinitis   . Anemia   . Breast CA ([facility])    surgery, chemo, XRT; had peripheral neuropathy (imbalance at times) after chemo  . Cellulitis    Left arm, recurrent   . Chronic renal disease, stage IV (HCC)    Dr. Moshe Cipro  . Depression   . Fatigue   . GERD (gastroesophageal reflux disease)    gastritis, EGD 02/2007  . Gout   . Hyperlipidemia   . Hypertension   . Macular degeneration 2011  . Normal cardiac stress test 12/2014  . Osteoarthritis   . Renal insufficiency     Family History  Problem Relation Age of Onset  . Breast cancer Other        ? of   . Heart attack Father 25  . Diabetes Sister        2  . Hypertension Sister   . Colon cancer Neg Hx   . Stomach cancer Neg Hx     Past Surgical History:  Procedure Laterality Date  . cataracts bilaterally  1 -2012  . LUNG BIOPSY  1999   neg  . MASTECTOMY Left 1999   and lymphnodes    Social History   Occupational History  . Occupation: retired     Fish farm manager: RETIRED  Tobacco Use  . Smoking status: Never Smoker  . Smokeless tobacco: Never Used  Substance and Sexual Activity  . Alcohol use: Yes    Alcohol/week: 0.0 standard drinks    Comment: rarely  . Drug use: No  . Sexual activity: Not on file

## 2018-07-17 ENCOUNTER — Encounter: Payer: Self-pay | Admitting: Internal Medicine

## 2018-07-17 ENCOUNTER — Ambulatory Visit (INDEPENDENT_AMBULATORY_CARE_PROVIDER_SITE_OTHER): Payer: Medicare Other | Admitting: Internal Medicine

## 2018-07-17 VITALS — BP 132/78 | HR 69 | Temp 98.0°F | Resp 16 | Ht 62.0 in | Wt 130.0 lb

## 2018-07-17 DIAGNOSIS — R197 Diarrhea, unspecified: Secondary | ICD-10-CM | POA: Diagnosis not present

## 2018-07-17 DIAGNOSIS — Z23 Encounter for immunization: Secondary | ICD-10-CM | POA: Diagnosis not present

## 2018-07-17 DIAGNOSIS — R531 Weakness: Secondary | ICD-10-CM | POA: Diagnosis not present

## 2018-07-17 DIAGNOSIS — I10 Essential (primary) hypertension: Secondary | ICD-10-CM

## 2018-07-17 MED ORDER — SERTRALINE HCL 25 MG PO TABS: 25.0000 mg | ORAL_TABLET | Freq: Every day | ORAL | 0 refills | Status: DC

## 2018-07-17 NOTE — Progress Notes (Signed)
Pre visit review using our clinic review tool, if applicable. No additional management support is needed unless otherwise documented below in the visit note. 

## 2018-07-17 NOTE — Patient Instructions (Addendum)
  GO TO THE FRONT DESK Schedule your next appointment for a  Check up in 3 months  Decrease sertraline to 1 tablet a day for 2 weeks, then stop

## 2018-07-17 NOTE — Progress Notes (Signed)
Subjective:    Patient ID: Samantha Mckenzie, female    DOB: 05-03-1936, 82 y.o.   MRN: [id number]  DOS:  07/17/2018 Type of visit - description : f/u Interval history: Since the last office visit she is doing about the same. HTN: Good ambulatory BPs Fatigue: About the same. Depression?  She is taking sertraline, feels medication is not doing anything for her.  Denies depression per se. Neck pain: About the same, further work-up pending, note from orthopedic surgery reviewed.   Review of Systems Appetite is somewhat decreased Continue with diarrhea, 4 episodes over the last week, nonbloody, yellowish in color, no abdominal pain.  Past Medical History:  Diagnosis Date  . Allergic rhinitis   . Anemia   . Breast CA ([facility])    surgery, chemo, XRT; had peripheral neuropathy (imbalance at times) after chemo  . Cellulitis    Left arm, recurrent   . Chronic renal disease, stage IV (HCC)    Dr. Moshe Cipro  . Depression   . Fatigue   . GERD (gastroesophageal reflux disease)    gastritis, EGD 02/2007  . Gout   . Hyperlipidemia   . Hypertension   . Macular degeneration 2011  . Normal cardiac stress test 12/2014  . Osteoarthritis   . Renal insufficiency     Past Surgical History:  Procedure Laterality Date  . cataracts bilaterally  1 -2012  . LUNG BIOPSY  1999   neg  . MASTECTOMY Left 1999   and lymphnodes     Social History   Socioeconomic History  . Marital status: Married    Spouse name: Not on file  . Number of children: 2  . Years of education: Not on file  . Highest education level: Not on file  Occupational History  . Occupation: retired     Fish farm manager: RETIRED  Social Needs  . Financial resource strain: Not on file  . Food insecurity:    Worry: Not on file    Inability: Not on file  . Transportation needs:    Medical: Not on file    Non-medical: Not on file  Tobacco Use  . Smoking status: Never Smoker  . Smokeless tobacco: Never Used  Substance and  Sexual Activity  . Alcohol use: Yes    Alcohol/week: 0.0 standard drinks    Comment: rarely  . Drug use: No  . Sexual activity: Not Currently  Lifestyle  . Physical activity:    Days per week: Not on file    Minutes per session: Not on file  . Stress: Not on file  Relationships  . Social connections:    Talks on phone: Not on file    Gets together: Not on file    Attends religious service: Not on file    Active member of club or organization: Not on file    Attends meetings of clubs or organizations: Not on file    Relationship status: Not on file  . Intimate partner violence:    Fear of current or ex partner: Not on file    Emotionally abused: Not on file    Physically abused: Not on file    Forced sexual activity: Not on file  Other Topics Concern  . Not on file  Social History Narrative   Lives w/ husband , 2 children in [place], 2 [place] as of 07/17/2018      Reactions   Codeine Nausea And  Vomiting   Morphine Nausea And Vomiting   Oxycodone-acetaminophen Nausea And Vomiting   Sulfonamide Derivatives Nausea And Vomiting   Tramadol Hcl Nausea And Vomiting      Medication List        Accurate as of 07/17/18  9:00 PM. Always use your most recent med list.          allopurinol 100 MG tablet Commonly known as:  ZYLOPRIM Take 1 tablet (100 mg total) by mouth daily.   amLODipine 10 MG tablet Commonly known as:  NORVASC Take 1 tablet (10 mg total) by mouth daily.   aspirin 81 MG tablet Take 81 mg by mouth daily.   atorvastatin 20 MG tablet Commonly known as:  LIPITOR Take 1 tablet (20 mg total) by mouth daily.   CENTRUM SILVER PO Take 1 tablet by mouth daily.   gabapentin 100 MG capsule Commonly known as:  NEURONTIN Take 2 capsules (200 mg total) by mouth at bedtime.   metoprolol tartrate 25 MG tablet Commonly known as:  LOPRESSOR Take 1 tablet (25 mg total) by mouth 2 (two) times daily.   ondansetron 4 MG disintegrating  tablet Commonly known as:  ZOFRAN-ODT Take 1 tablet (4 mg total) by mouth every 8 (eight) hours as needed for nausea or vomiting.   sertraline 25 MG tablet Commonly known as:  ZOLOFT Take 1 tablet (25 mg total) by mouth daily.   vitamin B-12 1000 MCG tablet Commonly known as:  CYANOCOBALAMIN Take 1,000 mcg by mouth daily.   Vitamin D3 1000 units Caps Take 1 capsule by mouth daily.          Objective:   Physical Exam BP 132/78 (BP Location: Right Arm, Patient Position: Sitting, Cuff Size: Small)   Pulse 69   Temp 98 F (36.7 C) (Oral)   Resp 16   Ht 5' 2"  (1.575 m)   Wt 130 lb (59 kg)   SpO2 96%   BMI 23.78 kg/m  General:   Well developed, NAD, see BMI.  HEENT:  Normocephalic . Face symmetric, atraumatic Lungs:  CTA B Normal respiratory effort, no intercostal retractions, no accessory muscle use. Heart: RRR,  no murmur.  No pretibial edema bilaterally  Skin: Not pale. Not jaundice Neurologic:  alert & oriented X3.  Speech normal, gait appropriate for age   Psych--  Cognition and judgment appear intact.  Cooperative with normal attention span and concentration.  Behavior appropriate. No anxious or depressed appearing.      Assessment & Plan:   Assessment  DM- neuropathy (paresthesias, nl pinprick exam), CKD HTN Hyperlipidemia: Lipitor intolerant? CAD? States see Dr Terrence Dupont, no OV  records. (-) stress test 12-2014 CKD -- sees nephrology Gout GERD, nl EGD 01-2015  H/o Depression Leg pain-- RLS vs neuropathy, on gabapentin since 2013, good results H/o anemia --- no iron def , Cscope 2012, normal EGD 01-2015 Breast cancer:  --S/p Surgery (L mastectomy), chemotherapy, XRT --Peripheral neuropathy felt to be due to chemotherapy --Recurrent left arm cellulitis DJD  PLAN Generalized weakness: Continue complaining of lack of energy, she looks well today.  Work-up so far negative. rec observation. Diarrhea: Ongoing, last month a C. difficile, lactoferrin and  culture were negative.  Refer to GI.  Further eval?. HTN: Good ambulatory BPs, currently on amlodipine, metoprolol.  No change Depression?  I do think there is some degree of depression related to caregiver fatigue.  She takes her husband.  She does not feel the same way and  likes to stop  Zoloft.  Will wean her off.  See instructions. Upper back pain: Seen by Ortho, MRI pending. CAD: Saw Dr. Terrence Dupont last week, found to be stable according to the patient. Flu shot today RTC 3 months

## 2018-07-17 NOTE — Assessment & Plan Note (Signed)
Generalized weakness: Continue complaining of lack of energy, she looks well today.  Work-up so far negative. rec observation. Diarrhea: Ongoing, last month a C. difficile, lactoferrin and culture were negative.  Refer to GI.  Further eval?. HTN: Good ambulatory BPs, currently on amlodipine, metoprolol.  No change Depression?  I do think there is some degree of depression related to caregiver fatigue.  She takes her husband.  She does not feel the same way and  likes to stop Zoloft.  Will wean her off.  See instructions. Upper back pain: Seen by Ortho, MRI pending. CAD: Saw Dr. Terrence Dupont last week, found to be stable according to the patient. Flu shot today RTC 3 months

## 2018-07-19 ENCOUNTER — Ambulatory Visit (INDEPENDENT_AMBULATORY_CARE_PROVIDER_SITE_OTHER): Payer: Self-pay | Admitting: Orthopedic Surgery

## 2018-07-27 DIAGNOSIS — N184 Chronic kidney disease, stage 4 (severe): Secondary | ICD-10-CM | POA: Diagnosis not present

## 2018-07-27 DIAGNOSIS — Z6826 Body mass index (BMI) 26.0-26.9, adult: Secondary | ICD-10-CM | POA: Diagnosis not present

## 2018-07-27 DIAGNOSIS — I129 Hypertensive chronic kidney disease with stage 1 through stage 4 chronic kidney disease, or unspecified chronic kidney disease: Secondary | ICD-10-CM | POA: Diagnosis not present

## 2018-07-27 DIAGNOSIS — E875 Hyperkalemia: Secondary | ICD-10-CM | POA: Diagnosis not present

## 2018-07-27 DIAGNOSIS — E785 Hyperlipidemia, unspecified: Secondary | ICD-10-CM | POA: Diagnosis not present

## 2018-07-27 DIAGNOSIS — K219 Gastro-esophageal reflux disease without esophagitis: Secondary | ICD-10-CM | POA: Diagnosis not present

## 2018-07-27 DIAGNOSIS — M109 Gout, unspecified: Secondary | ICD-10-CM | POA: Diagnosis not present

## 2018-08-02 ENCOUNTER — Telehealth: Payer: Self-pay | Admitting: Internal Medicine

## 2018-08-02 ENCOUNTER — Other Ambulatory Visit: Payer: Self-pay | Admitting: Internal Medicine

## 2018-08-02 NOTE — Telephone Encounter (Signed)
Noted Atorvastatin 20 mg. was sent to [facility], today, per CMA in office; # 90; RF x 1

## 2018-08-02 NOTE — Telephone Encounter (Signed)
FYI

## 2018-08-02 NOTE — Telephone Encounter (Signed)
Noted, thx.

## 2018-08-02 NOTE — Telephone Encounter (Signed)
Copied from [place] [id number]. Topic: Quick Communication - Rx Refill/Question >> Aug 02, 2018  2:51 PM Burchel, Abbi R wrote: Medication:  sertraline (ZOLOFT) 25 MG tablet    Pt states she has finished taking this rx, and cannot tell any difference in the way she feels.  She has discontinued taking this medication.  Please notify Dr Larose Kells that she is no longer taking it.

## 2018-08-02 NOTE — Telephone Encounter (Signed)
Copied from [place] [id number]. Topic: Quick Communication - Rx Refill/Question >> Aug 02, 2018  2:48 PM Burchel, Abbi R wrote: Medication: atorvastatin (LIPITOR) 20 MG tablet   Preferred Pharmacy: [facility], [place]  [phone] (Phone) [phone] (Fax)   Pt was advised that RX refills may take up to 3 business days. We ask that you follow-up with your pharmacy.

## 2018-08-04 ENCOUNTER — Ambulatory Visit
Admission: RE | Admit: 2018-08-04 | Discharge: 2018-08-04 | Disposition: A | Discharge status: Home / Self Care | Payer: Medicare Other | Source: Ambulatory Visit | Attending: Orthopedic Surgery | Admitting: Orthopedic Surgery

## 2018-08-04 DIAGNOSIS — M4802 Spinal stenosis, cervical region: Secondary | ICD-10-CM | POA: Diagnosis not present

## 2018-08-04 DIAGNOSIS — M546 Pain in thoracic spine: Secondary | ICD-10-CM

## 2018-08-04 DIAGNOSIS — M542 Cervicalgia: Secondary | ICD-10-CM

## 2018-08-21 ENCOUNTER — Ambulatory Visit (INDEPENDENT_AMBULATORY_CARE_PROVIDER_SITE_OTHER): Payer: Medicare Other | Admitting: Orthopedic Surgery

## 2018-08-21 ENCOUNTER — Encounter (INDEPENDENT_AMBULATORY_CARE_PROVIDER_SITE_OTHER): Payer: Self-pay | Admitting: Orthopedic Surgery

## 2018-08-21 DIAGNOSIS — M542 Cervicalgia: Secondary | ICD-10-CM

## 2018-08-21 NOTE — Progress Notes (Signed)
Office Visit Note   Patient: Samantha Mckenzie           Date of Birth: 1935/11/10           MRN: [id number] Visit Date: 08/21/2018 Requested by: Colon Branch, [address] [place], [place] [address] PCP: Colon Branch, MD  Subjective: Chief Complaint  Patient presents with  . Follow-up    scan review    HPI: Samantha Mckenzie is a patient with neck and thoracic spine pain.  Since of seen her she is had an MRI scan of both.  T-spine MRI okay.  C-spine MRI shows moderate foraminal stenosis at C4-5 and C5-6.  Patient states she has good and bad days.  It was pretty bad a month ago.  Takes Tylenol for pain.  Denies any radicular symptoms.  Reports primarily trapezial type symptoms.              ROS: All systems reviewed are negative as they relate to the chief complaint within the history of present illness.  Patient denies  fevers or chills.   Assessment & Plan: Visit Diagnoses:  1. Neck pain     Plan: Impression is foraminal stenosis cervical spine with some degenerative arthritic changes present.  I think that if Tylenol stops helping her than she should call me up and we will get her set up for an injection short of that I will see her back as needed.  Follow-Up Instructions: Return if symptoms worsen or fail to improve.   Orders:  No orders of the defined types were placed in this encounter.  No orders of the defined types were placed in this encounter.     Procedures: No procedures performed   Clinical Data: No additional findings.  Objective: Vital Signs: There were no vitals taken for this visit.  Physical Exam:   Constitutional: Patient appears well-developed HEENT:  Head: Normocephalic Eyes:EOM are normal Neck: Normal range of motion Cardiovascular: Normal rate Pulmonary/chest: Effort normal Neurologic: Patient is alert Skin: Skin is warm Psychiatric: Patient has normal mood and affect    Ortho Exam: Ortho exam demonstrates no change.  She is got  pretty decent cervical spine range of motion no loss of strength in her arms.  She does have forward flexion abduction above 90 degrees.  Specialty Comments:  No specialty comments available.  Imaging: No results found.   PMFS History: Patient Active Problem List   Diagnosis Date Noted  . Hyponatremia 06/21/2018  . Hypomagnesemia 06/21/2018  . Generalized weakness 06/20/2018  . PCP NOTES >>> 07/11/2015  . Myalgia and myositis 03/08/2014  . Dizziness and giddiness 05/06/2013  . Leg pain, RLS? neuropathy? 06/21/2012  . Cellulitis LEFT arm recurrent 12/08/2011  . Annual physical exam 06/22/2011  . Depression   . NECK PAIN 12/28/2010  . Fatigue 12/28/2010  . VITAMIN B12 DEFICIENCY 04/01/2008  . GERD 12/19/2007  . NEOP, MALIGNANT, FEMALE BREAST NOS 07/07/2007  . RENAL INSUFFICIENCY 07/07/2007  . Osteoarthritis 07/07/2007  . DM II (diabetes mellitus, type II), controlled ([facility]) 02/28/2007  . Hyperlipidemia 02/28/2007  . GOUT 02/28/2007  . ANEMIA-NOS 02/28/2007  . Essential hypertension 02/28/2007   Past Medical History:  Diagnosis Date  . Allergic rhinitis   . Anemia   . Breast CA ([facility])    surgery, chemo, XRT; had peripheral neuropathy (imbalance at times) after chemo  . Cellulitis    Left arm, recurrent   . Chronic renal disease, stage IV ([facility])  Dr. Moshe Cipro  . Depression   . Fatigue   . GERD (gastroesophageal reflux disease)    gastritis, EGD 02/2007  . Gout   . Hyperlipidemia   . Hypertension   . Macular degeneration 2011  . Normal cardiac stress test 12/2014  . Osteoarthritis   . Renal insufficiency     Family History  Problem Relation Age of Onset  . Breast cancer Other        ? of   . Heart attack Father 96  . Diabetes Sister        2  . Hypertension Sister   . Colon cancer Neg Hx   . Stomach cancer Neg Hx     Past Surgical History:  Procedure Laterality Date  . cataracts bilaterally  1 -2012  . LUNG BIOPSY  1999   neg  . MASTECTOMY Left  1999   and lymphnodes    Social History   Occupational History  . Occupation: retired     Fish farm manager: RETIRED  Tobacco Use  . Smoking status: Never Smoker  . Smokeless tobacco: Never Used  Substance and Sexual Activity  . Alcohol use: Yes    Alcohol/week: 0.0 standard drinks    Comment: rarely  . Drug use: No  . Sexual activity: Not Currently

## 2018-08-30 ENCOUNTER — Encounter: Payer: Self-pay | Admitting: Internal Medicine

## 2018-08-30 ENCOUNTER — Ambulatory Visit (INDEPENDENT_AMBULATORY_CARE_PROVIDER_SITE_OTHER): Payer: Medicare Other | Admitting: Internal Medicine

## 2018-08-30 VITALS — BP 120/70 | HR 66 | Ht 62.0 in | Wt 131.1 lb

## 2018-08-30 DIAGNOSIS — K529 Noninfective gastroenteritis and colitis, unspecified: Secondary | ICD-10-CM | POA: Diagnosis not present

## 2018-08-30 DIAGNOSIS — R197 Diarrhea, unspecified: Secondary | ICD-10-CM | POA: Diagnosis not present

## 2018-08-30 DIAGNOSIS — K5900 Constipation, unspecified: Secondary | ICD-10-CM

## 2018-08-30 NOTE — Progress Notes (Signed)
HISTORY OF PRESENT ILLNESS:  Samantha Mckenzie is a 82 y.o. female with multiple medical problems as listed below who presents today for evaluation of problems with diarrhea hollowed by alternating bowel habits.  Patient has not been seen in this office since March 2016 when she was evaluated by the advanced practitioner regarding GERD and concerns of her PPI.  In any event, patient tells me that on rare occasions over the years she would have episodes of diarrhea seemingly related to dietary indiscretion.  However, approximately 2 months ago she developed problems with daily diarrhea developed as watery.  She became dehydrated.  She was hospitalized (reviewed).  She was treated with Imodium which helped.  Since that time she describes occasional constipation, occasional urgency with loose stools, occasionally will go 1 or 2 weeks without problems.  She is not using any particular medications.  She has had rare episodes of incontinence.  Her last complete colonoscopy was December 2012.  This was normal except for diverticulosis.  Last upper endoscopy April 2016.  Normal.  Review of outside blood work from September 2019 shows creatinine 2.0.  Hemoglobin 8.9.  Normal MCV.  Review of outside x-rays show CT scan of the abdomen and pelvis without contrast June 20, 2018.  Incidental diverticulosis and bladder distention.  Otherwise normal CT scan.  She denies recent problems with GERD.  No longer taking acid suppressive therapy.  GI review of systems is otherwise negative  REVIEW OF SYSTEMS:  All non-GI ROS negative unless otherwise stated in the HPI except for sinus and allergies, arthritis  Past Medical History:  Diagnosis Date  . Allergic rhinitis   . Anemia   . Breast CA ([facility])    surgery, chemo, XRT; had peripheral neuropathy (imbalance at times) after chemo  . Cellulitis    Left arm, recurrent   . Chronic renal disease, stage IV ([facility])    Dr. Moshe Cipro  . Depression   . Fatigue   . GERD  (gastroesophageal reflux disease)    gastritis, EGD 02/2007  . Gout   . Hyperlipidemia   . Hypertension   . Macular degeneration 2011  . Normal cardiac stress test 12/2014  . Osteoarthritis   . Renal insufficiency     Past Surgical History:  Procedure Laterality Date  . cataracts bilaterally  1 -2012  . LUNG BIOPSY  1999   neg  . MASTECTOMY Left 1999   and lymphnodes     Social History Samantha Mckenzie  reports that she has never smoked. She has never used smokeless tobacco. She reports that she drinks alcohol. She reports that she does not use drugs.  family history includes Breast cancer in her other; Diabetes in her sister; Heart attack (age of onset: 9) in her father; Hypertension in her sister.  Allergies  Allergen Reactions  . Codeine Nausea And Vomiting  . Morphine Nausea And Vomiting  . Oxycodone-Acetaminophen Nausea And Vomiting  . Sulfonamide Derivatives Nausea And Vomiting  . Tramadol Hcl Nausea And Vomiting       PHYSICAL EXAMINATION: Vital signs: BP 120/70   Pulse 66   Ht 5' 2"  (1.575 m)   Wt 131 lb 2 oz (59.5 kg)   BMI 23.98 kg/m   Constitutional: generally well-appearing, no acute distress Psychiatric: alert and oriented x3, cooperative Eyes: extraocular movements intact, anicteric, conjunctiva pink Mouth: oral pharynx moist, no lesions Neck: supple no lymphadenopathy Cardiovascular: heart regular rate and rhythm, no murmur Lungs: clear to auscultation bilaterally Abdomen: soft, nontender, nondistended,  no obvious ascites, no peritoneal signs, normal bowel sounds, no organomegaly Rectal: Omitted Extremities: no clubbing, cyanosis, or lower extremity edema bilaterally Skin: no lesions on visible extremities Neuro: No focal deficits.  Cranial nerves intact  ASSESSMENT:  1.  Acute viral gastroenteritis.  This is the cause for her acute diarrheal illness in September with resultant dehydration.  The acute process has resolved 2.  Alternating bowel  habits.  Postinfectious dysmotility.  No alarm features 3.  Colonoscopy 2012 with diverticulosis 4.  History of GERD   PLAN:  1.  Reassurance 2.  Metamucil 1-2 tablespoons daily to improve bowel habit consistency 3.  GI follow-up as needed

## 2018-08-30 NOTE — Patient Instructions (Signed)
Take 1-2 tablespoons of Metamucil daily  Continue to wear protective undergarments  Please follow up as needed

## 2018-09-15 ENCOUNTER — Other Ambulatory Visit: Payer: Self-pay | Admitting: Internal Medicine

## 2018-09-15 DIAGNOSIS — Z1231 Encounter for screening mammogram for malignant neoplasm of breast: Secondary | ICD-10-CM

## 2018-09-19 ENCOUNTER — Other Ambulatory Visit: Payer: Self-pay | Admitting: Internal Medicine

## 2018-09-20 MED ORDER — AMLODIPINE BESYLATE 10 MG PO TABS: 10.0000 mg | ORAL_TABLET | Freq: Every day | ORAL | 1 refills | Status: DC

## 2018-10-17 DIAGNOSIS — E1122 Type 2 diabetes mellitus with diabetic chronic kidney disease: Secondary | ICD-10-CM | POA: Diagnosis not present

## 2018-10-17 DIAGNOSIS — I2510 Atherosclerotic heart disease of native coronary artery without angina pectoris: Secondary | ICD-10-CM | POA: Diagnosis not present

## 2018-10-17 DIAGNOSIS — I10 Essential (primary) hypertension: Secondary | ICD-10-CM | POA: Diagnosis not present

## 2018-10-17 DIAGNOSIS — N189 Chronic kidney disease, unspecified: Secondary | ICD-10-CM | POA: Diagnosis not present

## 2018-10-17 DIAGNOSIS — D649 Anemia, unspecified: Secondary | ICD-10-CM | POA: Diagnosis not present

## 2018-10-17 DIAGNOSIS — I252 Old myocardial infarction: Secondary | ICD-10-CM | POA: Diagnosis not present

## 2018-10-17 DIAGNOSIS — E785 Hyperlipidemia, unspecified: Secondary | ICD-10-CM | POA: Diagnosis not present

## 2018-10-26 ENCOUNTER — Ambulatory Visit
Admission: RE | Admit: 2018-10-26 | Discharge: 2018-10-26 | Disposition: A | Discharge status: Home / Self Care | Payer: Medicare Other | Source: Ambulatory Visit | Attending: Internal Medicine | Admitting: Internal Medicine

## 2018-10-26 DIAGNOSIS — Z1231 Encounter for screening mammogram for malignant neoplasm of breast: Secondary | ICD-10-CM

## 2018-11-27 ENCOUNTER — Encounter: Payer: Self-pay | Admitting: Internal Medicine

## 2018-11-27 ENCOUNTER — Ambulatory Visit (INDEPENDENT_AMBULATORY_CARE_PROVIDER_SITE_OTHER): Payer: Medicare Other | Admitting: Internal Medicine

## 2018-11-27 ENCOUNTER — Ambulatory Visit (HOSPITAL_BASED_OUTPATIENT_CLINIC_OR_DEPARTMENT_OTHER)
Admission: RE | Admit: 2018-11-27 | Discharge: 2018-11-27 | Disposition: A | Discharge status: Home / Self Care | Payer: Medicare Other | Source: Ambulatory Visit | Attending: Internal Medicine | Admitting: Internal Medicine

## 2018-11-27 VITALS — BP 116/74 | HR 69 | Temp 97.7°F | Resp 16 | Ht 62.0 in | Wt 137.1 lb

## 2018-11-27 DIAGNOSIS — R197 Diarrhea, unspecified: Secondary | ICD-10-CM | POA: Diagnosis not present

## 2018-11-27 DIAGNOSIS — M160 Bilateral primary osteoarthritis of hip: Secondary | ICD-10-CM | POA: Diagnosis not present

## 2018-11-27 DIAGNOSIS — E875 Hyperkalemia: Secondary | ICD-10-CM

## 2018-11-27 DIAGNOSIS — R5383 Other fatigue: Secondary | ICD-10-CM | POA: Diagnosis not present

## 2018-11-27 DIAGNOSIS — N189 Chronic kidney disease, unspecified: Secondary | ICD-10-CM | POA: Diagnosis not present

## 2018-11-27 DIAGNOSIS — M79605 Pain in left leg: Secondary | ICD-10-CM

## 2018-11-27 DIAGNOSIS — N289 Disorder of kidney and ureter, unspecified: Secondary | ICD-10-CM

## 2018-11-27 DIAGNOSIS — M79652 Pain in left thigh: Secondary | ICD-10-CM | POA: Diagnosis not present

## 2018-11-27 NOTE — Progress Notes (Signed)
Subjective:    Patient ID: Samantha Mckenzie, female    DOB: July 06, 1936, 83 y.o.   MRN: [id number]  DOS:  11/27/2018 Type of visit - description: Acute Her main concern today is a 2-week history of pain with ambulation.  The pain goes from the lateral aspect of the left hip and radiates down to the knee. No knee pain per se, mild low back pain which is at baseline. No pain at rest. No swelling per se, no recent fall. Diarrhea: GI note reviewed. Currently doing better with only occasional loose stools. Neck pain: Note from Ortho reviewed, doing better.   Review of Systems Denies fever chills Denies blood in the stools or abdominal pain.  No claudication   Past Medical History:  Diagnosis Date  . Allergic rhinitis   . Anemia   . Breast CA ([facility])    surgery, chemo, XRT; had peripheral neuropathy (imbalance at times) after chemo  . Cellulitis    Left arm, recurrent   . Chronic renal disease, stage IV ([facility])    Dr. Moshe Cipro  . Depression   . Fatigue   . GERD (gastroesophageal reflux disease)    gastritis, EGD 02/2007  . Gout   . Hyperlipidemia   . Hypertension   . Macular degeneration 2011  . Normal cardiac stress test 12/2014  . Osteoarthritis   . Renal insufficiency     Past Surgical History:  Procedure Laterality Date  . cataracts bilaterally  1 -2012  . LUNG BIOPSY  1999   neg  . MASTECTOMY Left 1999   and lymphnodes     Social History   Socioeconomic History  . Marital status: Married    Spouse name: Not on file  . Number of children: 2  . Years of education: Not on file  . Highest education level: Not on file  Occupational History  . Occupation: retired     Fish farm manager: RETIRED  Social Needs  . Financial resource strain: Not on file  . Food insecurity:    Worry: Not on file    Inability: Not on file  . Transportation needs:    Medical: Not on file    Non-medical: Not on file  Tobacco Use  . Smoking status: Never Smoker  . Smokeless tobacco: Never  Used  Substance and Sexual Activity  . Alcohol use: Yes    Alcohol/week: 0.0 standard drinks    Comment: rarely  . Drug use: No  . Sexual activity: Not Currently  Lifestyle  . Physical activity:    Days per week: Not on file    Minutes per session: Not on file  . Stress: Not on file  Relationships  . Social connections:    Talks on phone: Not on file    Gets together: Not on file    Attends religious service: Not on file    Active member of club or organization: Not on file    Attends meetings of clubs or organizations: Not on file    Relationship status: Not on file  . Intimate partner violence:    Fear of current or ex partner: Not on file    Emotionally abused: Not on file    Physically abused: Not on file    Forced sexual activity: Not on file  Other Topics Concern  . Not on file  Social History Narrative   Lives w/ husband , 2 children in [place], [place]  Allergies as of 11/27/2018      Reactions   Codeine Nausea And Vomiting   Morphine Nausea And Vomiting   Oxycodone-acetaminophen Nausea And Vomiting   Sulfonamide Derivatives Nausea And Vomiting   Tramadol Hcl Nausea And Vomiting      Medication List       Accurate as of November 27, 2018 11:59 PM. Always use your most recent med list.        allopurinol 100 MG tablet Commonly known as:  ZYLOPRIM Take 1 tablet (100 mg total) by mouth daily.   amLODipine 10 MG tablet Commonly known as:  NORVASC Take 1 tablet (10 mg total) by mouth daily.   aspirin 81 MG tablet Take 81 mg by mouth daily.   atorvastatin 20 MG tablet Commonly known as:  LIPITOR Take 1 tablet (20 mg total) by mouth daily.   CENTRUM SILVER PO Take 1 tablet by mouth daily.   gabapentin 100 MG capsule Commonly known as:  NEURONTIN Take 2 capsules (200 mg total) by mouth at bedtime.   metoprolol tartrate 25 MG tablet Commonly known as:  LOPRESSOR Take 1 tablet (25 mg total) by mouth 2 (two) times daily.   ondansetron 4 MG  disintegrating tablet Commonly known as:  ZOFRAN ODT Take 1 tablet (4 mg total) by mouth every 8 (eight) hours as needed for nausea or vomiting.   vitamin B-12 1000 MCG tablet Commonly known as:  CYANOCOBALAMIN Take 1,000 mcg by mouth daily.   Vitamin D3 25 MCG (1000 UT) Caps Take 1 capsule by mouth daily.           Objective:   Physical Exam Musculoskeletal:       Legs:    BP 116/74 (BP Location: Right Arm, Patient Position: Sitting, Cuff Size: Small)   Pulse 69   Temp 97.7 F (36.5 C) (Oral)   Resp 16   Ht 5' 2"  (1.575 m)   Wt 137 lb 2 oz (62.2 kg)   SpO2 95%   BMI 25.08 kg/m  General:   Well developed, NAD, BMI noted. HEENT:  Normocephalic . Face symmetric, atraumatic Lungs:  CTA B Normal respiratory effort, no intercostal retractions, no accessory muscle use. Heart: RRR,  no murmur.  No pretibial edema bilaterally.  Good pedal pulses MSK: Not tender at the trochanteric bursa's.  Hip rotation normal.  Knee rotation normal. Gait: Slightly limited by left hip/leg pain Skin: Not pale. Not jaundice Neurologic:  alert & oriented X3.  Speech normal Psych--  Cognition and judgment appear intact.  Cooperative with normal attention span and concentration.  Behavior appropriate. No anxious or depressed appearing.      Assessment     Assessment  DM- neuropathy (paresthesias, nl pinprick exam), CKD HTN Hyperlipidemia: Lipitor intolerant? CAD? States see Dr Terrence Dupont, no OV  records. (-) stress test 12-2014 CKD -- sees nephrology Gout GERD, nl EGD 01-2015  H/o Depression Leg pain-- RLS vs neuropathy, on gabapentin since 2013, good results H/o anemia --- no iron def , Cscope 2012, normal EGD 01-2015 Breast cancer:  --S/p Surgery (L mastectomy), chemotherapy, XRT --Peripheral neuropathy felt to be due to chemotherapy --Recurrent left arm cellulitis DJD  PLAN CRI: Check BMP and CBC Left leg pain: new problem. Pain only with ambulation and weightbearing.   Normal pedal pulses.  Normal hip rotation.  No recent fall.  We will get a x-ray of the hip and femur, h/o breast ca, r/o mets, DJD?   Continue Tylenol, avoid NSAIDs , call Ortho-Dr  Dean's office if pain continue. Diarrhea: Saw GI 08/2018, symptoms felt to be postinfection dysmotility, recommended Metamucil and reassurance. Neck pain: Saw orthopedic surgery, T-spine MRI okay.  C-spine MRI shows moderate foraminal stenosis at C4-5 and C5-6.  Was recommended to continue Tylenol consider a local injection but the neck pain is actually improved. Caregiver fatigue: The patient continues to be somewhat stressed and occasionally frustrated by being the caregiver for her husband who is chronically ill.  No depression per se, patient is counseled. RTC 4 months  F2F > 25 min

## 2018-11-27 NOTE — Progress Notes (Signed)
Pre visit review using our clinic review tool, if applicable. No additional management support is needed unless otherwise documented below in the visit note. 

## 2018-11-27 NOTE — Patient Instructions (Signed)
GO TO THE LAB : Get the blood work     GO TO THE FRONT DESK Schedule your next appointment   follow-up in 4 months   STOP BY THE FIRST FLOOR:  get the XR

## 2018-11-28 ENCOUNTER — Other Ambulatory Visit (INDEPENDENT_AMBULATORY_CARE_PROVIDER_SITE_OTHER): Payer: Medicare Other

## 2018-11-28 DIAGNOSIS — N189 Chronic kidney disease, unspecified: Secondary | ICD-10-CM | POA: Diagnosis not present

## 2018-11-28 LAB — BASIC METABOLIC PANEL
BUN: 46 mg/dL — ABNORMAL HIGH (ref 6–23)
CO2: 30 mEq/L (ref 19–32)
Calcium: 9.2 mg/dL (ref 8.4–10.5)
Chloride: 103 mEq/L (ref 96–112)
Creatinine, Ser: 2.38 mg/dL — ABNORMAL HIGH (ref 0.40–1.20)
GFR: 19.6 mL/min — AB (ref >60.00)
Glucose, Bld: 98 mg/dL (ref 70–99)
POTASSIUM: 5.2 meq/L — AB (ref 3.5–5.1)
SODIUM: 141 meq/L (ref 135–145)

## 2018-11-28 LAB — CBC WITH DIFFERENTIAL/PLATELET
Basophils Absolute: 0 10*3/uL (ref 0.0–0.1)
Basophils Relative: 0.6 % (ref 0.0–3.0)
Eosinophils Absolute: 0.1 10*3/uL (ref 0.0–0.7)
Eosinophils Relative: 2.5 % (ref 0.0–5.0)
HCT: 32.5 % — ABNORMAL LOW (ref 36.0–46.0)
HEMOGLOBIN: 10.6 g/dL — AB (ref 12.0–15.0)
LYMPHS PCT: 23.7 % (ref 12.0–46.0)
Lymphs Abs: 1.3 10*3/uL (ref 0.7–4.0)
MCHC: 32.7 g/dL (ref 30.0–36.0)
MCV: 94 fl (ref 78.0–100.0)
MONO ABS: 0.4 10*3/uL (ref 0.1–1.0)
MONOS PCT: 6.8 % (ref 3.0–12.0)
Neutro Abs: 3.6 10*3/uL (ref 1.4–7.7)
Neutrophils Relative %: 66.4 % (ref 43.0–77.0)
Platelets: 277 10*3/uL (ref 150.0–400.0)
RBC: 3.46 Mil/uL — AB (ref 3.87–5.11)
RDW: 16.3 % — ABNORMAL HIGH (ref 11.5–15.5)
WBC: 5.4 10*3/uL (ref 4.0–10.5)

## 2018-11-28 NOTE — Assessment & Plan Note (Signed)
CRI: Check BMP and CBC Left leg pain: new problem. Pain only with ambulation and weightbearing.  Normal pedal pulses.  Normal hip rotation.  No recent fall.  We will get a x-ray of the hip and femur, h/o breast ca, r/o mets, DJD?   Continue Tylenol, avoid NSAIDs , call Ortho-Dr Dean's office if pain continue. Diarrhea: Saw GI 08/2018, symptoms felt to be postinfection dysmotility, recommended Metamucil and reassurance. Neck pain: Saw orthopedic surgery, T-spine MRI okay.  C-spine MRI shows moderate foraminal stenosis at C4-5 and C5-6.  Was recommended to continue Tylenol consider a local injection but the neck pain is actually improved. Caregiver fatigue: The patient continues to be somewhat stressed and occasionally frustrated by being the caregiver for her husband who is chronically ill.  No depression per se, patient is counseled. RTC 4 months

## 2018-11-29 NOTE — Addendum Note (Signed)
Addended byDamita Dunnings D on: 11/29/2018 07:52 AM   Modules accepted: Orders

## 2018-12-22 ENCOUNTER — Other Ambulatory Visit: Payer: Self-pay

## 2018-12-22 MED ORDER — GABAPENTIN 100 MG PO CAPS: 200.0000 mg | ORAL_CAPSULE | Freq: Every day | ORAL | 1 refills | Status: DC

## 2018-12-25 ENCOUNTER — Telehealth: Payer: Self-pay

## 2018-12-25 NOTE — Telephone Encounter (Signed)
Please advise 

## 2018-12-25 NOTE — Telephone Encounter (Signed)
Copied from [facility] [id number]. Topic: General - Inquiry >> Dec 25, 2018  3:28 PM Alanda Slim E wrote: Reason for CRM: Pt is experiencing Heart burn and wants advice from Dr. Larose Kells on what to take for it due to her potassium and kidney problems /if something is called in please use  [place], [place]. [phone] (Phone) [phone] (Fax)  Derrek Monaco advise

## 2018-12-26 NOTE — Telephone Encounter (Signed)
Spoke w/ Pt- informed of recommendations. Pt verbalized understanding.  

## 2018-12-26 NOTE — Telephone Encounter (Signed)
Advise patient to take omeprazole 20 mg 1 or 2 tablets before breakfast. I added the medication to her list, there is no interaction and is okay to take with kidney problems. Call if not better.  Needs also to be seen if she is not certain her symptoms are d/t acid reflux.

## 2019-01-31 ENCOUNTER — Other Ambulatory Visit: Payer: Self-pay | Admitting: Internal Medicine

## 2019-02-13 ENCOUNTER — Telehealth: Payer: Self-pay

## 2019-02-13 DIAGNOSIS — E1122 Type 2 diabetes mellitus with diabetic chronic kidney disease: Secondary | ICD-10-CM | POA: Diagnosis not present

## 2019-02-13 DIAGNOSIS — N189 Chronic kidney disease, unspecified: Secondary | ICD-10-CM | POA: Diagnosis not present

## 2019-02-13 DIAGNOSIS — D649 Anemia, unspecified: Secondary | ICD-10-CM | POA: Diagnosis not present

## 2019-02-13 DIAGNOSIS — I10 Essential (primary) hypertension: Secondary | ICD-10-CM | POA: Diagnosis not present

## 2019-02-13 DIAGNOSIS — I2510 Atherosclerotic heart disease of native coronary artery without angina pectoris: Secondary | ICD-10-CM | POA: Diagnosis not present

## 2019-02-13 DIAGNOSIS — E559 Vitamin D deficiency, unspecified: Secondary | ICD-10-CM | POA: Diagnosis not present

## 2019-02-13 DIAGNOSIS — E785 Hyperlipidemia, unspecified: Secondary | ICD-10-CM | POA: Diagnosis not present

## 2019-02-13 DIAGNOSIS — I252 Old myocardial infarction: Secondary | ICD-10-CM | POA: Diagnosis not present

## 2019-02-13 NOTE — Telephone Encounter (Signed)
LVM in spanish for pt to schedule VOV with P[facility], need appt for regarding Potassium.

## 2019-02-13 NOTE — Telephone Encounter (Signed)
Due for follow-up visit regarding her potassium. Samantha Mckenzie- can you schedule virtual visit please?

## 2019-03-04 ENCOUNTER — Other Ambulatory Visit: Payer: Self-pay | Admitting: Internal Medicine

## 2019-03-06 DIAGNOSIS — E785 Hyperlipidemia, unspecified: Secondary | ICD-10-CM | POA: Diagnosis not present

## 2019-03-06 DIAGNOSIS — K219 Gastro-esophageal reflux disease without esophagitis: Secondary | ICD-10-CM | POA: Diagnosis not present

## 2019-03-06 DIAGNOSIS — I252 Old myocardial infarction: Secondary | ICD-10-CM | POA: Diagnosis not present

## 2019-03-06 DIAGNOSIS — I2510 Atherosclerotic heart disease of native coronary artery without angina pectoris: Secondary | ICD-10-CM | POA: Diagnosis not present

## 2019-03-06 DIAGNOSIS — E559 Vitamin D deficiency, unspecified: Secondary | ICD-10-CM | POA: Diagnosis not present

## 2019-03-06 DIAGNOSIS — K920 Hematemesis: Secondary | ICD-10-CM | POA: Diagnosis not present

## 2019-03-06 DIAGNOSIS — D649 Anemia, unspecified: Secondary | ICD-10-CM | POA: Diagnosis not present

## 2019-03-06 DIAGNOSIS — N189 Chronic kidney disease, unspecified: Secondary | ICD-10-CM | POA: Diagnosis not present

## 2019-03-06 DIAGNOSIS — E1122 Type 2 diabetes mellitus with diabetic chronic kidney disease: Secondary | ICD-10-CM | POA: Diagnosis not present

## 2019-03-06 DIAGNOSIS — I10 Essential (primary) hypertension: Secondary | ICD-10-CM | POA: Diagnosis not present

## 2019-03-19 ENCOUNTER — Emergency Department (HOSPITAL_COMMUNITY): Payer: Medicare Other

## 2019-03-19 ENCOUNTER — Encounter (HOSPITAL_COMMUNITY): Payer: Self-pay | Admitting: Emergency Medicine

## 2019-03-19 ENCOUNTER — Other Ambulatory Visit: Payer: Self-pay | Admitting: Internal Medicine

## 2019-03-19 ENCOUNTER — Inpatient Hospital Stay (HOSPITAL_COMMUNITY)
Admission: EM | Admit: 2019-03-19 | Discharge: 2019-03-27 | DRG: 871 | Disposition: A | Discharge status: Home Health | Payer: Medicare Other | Attending: Internal Medicine | Admitting: Internal Medicine

## 2019-03-19 DIAGNOSIS — R0902 Hypoxemia: Secondary | ICD-10-CM | POA: Diagnosis not present

## 2019-03-19 DIAGNOSIS — I499 Cardiac arrhythmia, unspecified: Secondary | ICD-10-CM | POA: Diagnosis not present

## 2019-03-19 DIAGNOSIS — Z20828 Contact with and (suspected) exposure to other viral communicable diseases: Secondary | ICD-10-CM | POA: Diagnosis not present

## 2019-03-19 DIAGNOSIS — J189 Pneumonia, unspecified organism: Secondary | ICD-10-CM

## 2019-03-19 DIAGNOSIS — M109 Gout, unspecified: Secondary | ICD-10-CM | POA: Diagnosis present

## 2019-03-19 DIAGNOSIS — I517 Cardiomegaly: Secondary | ICD-10-CM | POA: Diagnosis present

## 2019-03-19 DIAGNOSIS — A419 Sepsis, unspecified organism: Principal | ICD-10-CM | POA: Diagnosis present

## 2019-03-19 DIAGNOSIS — E785 Hyperlipidemia, unspecified: Secondary | ICD-10-CM | POA: Diagnosis present

## 2019-03-19 DIAGNOSIS — Z833 Family history of diabetes mellitus: Secondary | ICD-10-CM

## 2019-03-19 DIAGNOSIS — E872 Acidosis, unspecified: Secondary | ICD-10-CM | POA: Diagnosis present

## 2019-03-19 DIAGNOSIS — N179 Acute kidney failure, unspecified: Secondary | ICD-10-CM | POA: Diagnosis not present

## 2019-03-19 DIAGNOSIS — D539 Nutritional anemia, unspecified: Secondary | ICD-10-CM | POA: Diagnosis present

## 2019-03-19 DIAGNOSIS — E118 Type 2 diabetes mellitus with unspecified complications: Secondary | ICD-10-CM | POA: Diagnosis present

## 2019-03-19 DIAGNOSIS — K8020 Calculus of gallbladder without cholecystitis without obstruction: Secondary | ICD-10-CM | POA: Diagnosis present

## 2019-03-19 DIAGNOSIS — R1084 Generalized abdominal pain: Secondary | ICD-10-CM | POA: Diagnosis not present

## 2019-03-19 DIAGNOSIS — J9601 Acute respiratory failure with hypoxia: Secondary | ICD-10-CM | POA: Diagnosis not present

## 2019-03-19 DIAGNOSIS — R52 Pain, unspecified: Secondary | ICD-10-CM | POA: Diagnosis not present

## 2019-03-19 DIAGNOSIS — N184 Chronic kidney disease, stage 4 (severe): Secondary | ICD-10-CM | POA: Diagnosis not present

## 2019-03-19 DIAGNOSIS — E1122 Type 2 diabetes mellitus with diabetic chronic kidney disease: Secondary | ICD-10-CM | POA: Diagnosis present

## 2019-03-19 DIAGNOSIS — J181 Lobar pneumonia, unspecified organism: Secondary | ICD-10-CM | POA: Diagnosis present

## 2019-03-19 DIAGNOSIS — R112 Nausea with vomiting, unspecified: Secondary | ICD-10-CM | POA: Diagnosis not present

## 2019-03-19 DIAGNOSIS — R109 Unspecified abdominal pain: Secondary | ICD-10-CM

## 2019-03-19 DIAGNOSIS — I129 Hypertensive chronic kidney disease with stage 1 through stage 4 chronic kidney disease, or unspecified chronic kidney disease: Secondary | ICD-10-CM | POA: Diagnosis present

## 2019-03-19 DIAGNOSIS — E1142 Type 2 diabetes mellitus with diabetic polyneuropathy: Secondary | ICD-10-CM | POA: Diagnosis present

## 2019-03-19 DIAGNOSIS — F4321 Adjustment disorder with depressed mood: Secondary | ICD-10-CM

## 2019-03-19 DIAGNOSIS — N3289 Other specified disorders of bladder: Secondary | ICD-10-CM | POA: Diagnosis not present

## 2019-03-19 DIAGNOSIS — Z853 Personal history of malignant neoplasm of breast: Secondary | ICD-10-CM

## 2019-03-19 DIAGNOSIS — R197 Diarrhea, unspecified: Secondary | ICD-10-CM | POA: Diagnosis not present

## 2019-03-19 DIAGNOSIS — Z794 Long term (current) use of insulin: Secondary | ICD-10-CM

## 2019-03-19 DIAGNOSIS — K5730 Diverticulosis of large intestine without perforation or abscess without bleeding: Secondary | ICD-10-CM | POA: Diagnosis present

## 2019-03-19 DIAGNOSIS — I440 Atrioventricular block, first degree: Secondary | ICD-10-CM | POA: Diagnosis not present

## 2019-03-19 DIAGNOSIS — J90 Pleural effusion, not elsewhere classified: Secondary | ICD-10-CM | POA: Diagnosis not present

## 2019-03-19 DIAGNOSIS — K219 Gastro-esophageal reflux disease without esophagitis: Secondary | ICD-10-CM | POA: Diagnosis present

## 2019-03-19 DIAGNOSIS — E876 Hypokalemia: Secondary | ICD-10-CM | POA: Diagnosis present

## 2019-03-19 DIAGNOSIS — I10 Essential (primary) hypertension: Secondary | ICD-10-CM | POA: Diagnosis present

## 2019-03-19 DIAGNOSIS — R4702 Dysphasia: Secondary | ICD-10-CM | POA: Diagnosis present

## 2019-03-19 DIAGNOSIS — G252 Other specified forms of tremor: Secondary | ICD-10-CM | POA: Diagnosis present

## 2019-03-19 DIAGNOSIS — Z8249 Family history of ischemic heart disease and other diseases of the circulatory system: Secondary | ICD-10-CM

## 2019-03-19 DIAGNOSIS — D649 Anemia, unspecified: Secondary | ICD-10-CM | POA: Diagnosis present

## 2019-03-19 LAB — COMPREHENSIVE METABOLIC PANEL
ALT: 19 U/L (ref 0–44)
AST: 32 U/L (ref 15–41)
Albumin: 3.8 g/dL (ref 3.5–5.0)
Alkaline Phosphatase: 68 U/L (ref 38–126)
Anion gap: 16 — ABNORMAL HIGH (ref 5–15)
BUN: 41 mg/dL — ABNORMAL HIGH (ref 8–23)
CO2: 24 mmol/L (ref 22–32)
Calcium: 8.4 mg/dL — ABNORMAL LOW (ref 8.9–10.3)
Chloride: 96 mmol/L — ABNORMAL LOW (ref 98–111)
Creatinine, Ser: 2.79 mg/dL — ABNORMAL HIGH (ref 0.44–1.00)
GFR calc Af Amer: 18 mL/min — ABNORMAL LOW (ref >60)
GFR calc non Af Amer: 15 mL/min — ABNORMAL LOW (ref >60)
Glucose, Bld: 153 mg/dL — ABNORMAL HIGH (ref 70–99)
Potassium: 3.3 mmol/L — ABNORMAL LOW (ref 3.5–5.1)
Sodium: 136 mmol/L (ref 135–145)
Total Bilirubin: 0.5 mg/dL (ref 0.3–1.2)
Total Protein: 8.3 g/dL — ABNORMAL HIGH (ref 6.5–8.1)

## 2019-03-19 LAB — URINALYSIS, ROUTINE W REFLEX MICROSCOPIC
Bilirubin Urine: NEGATIVE
Glucose, UA: 50 mg/dL — AB
Hgb urine dipstick: NEGATIVE
Ketones, ur: NEGATIVE mg/dL
Leukocytes,Ua: NEGATIVE
Nitrite: NEGATIVE
Protein, ur: NEGATIVE mg/dL
Specific Gravity, Urine: 1.005 (ref 1.005–1.030)
pH: 6 (ref 5.0–8.0)

## 2019-03-19 LAB — CBC WITH DIFFERENTIAL/PLATELET
Abs Immature Granulocytes: 0.04 10*3/uL (ref 0.00–0.07)
Basophils Absolute: 0 10*3/uL (ref 0.0–0.1)
Basophils Relative: 0 %
Eosinophils Absolute: 0 10*3/uL (ref 0.0–0.5)
Eosinophils Relative: 0 %
HCT: 32.3 % — ABNORMAL LOW (ref 36.0–46.0)
Hemoglobin: 10.7 g/dL — ABNORMAL LOW (ref 12.0–15.0)
Immature Granulocytes: 0 %
Lymphocytes Relative: 10 %
Lymphs Abs: 0.9 10*3/uL (ref 0.7–4.0)
MCH: 30.6 pg (ref 26.0–34.0)
MCHC: 33.1 g/dL (ref 30.0–36.0)
MCV: 92.3 fL (ref 80.0–100.0)
Monocytes Absolute: 0.4 10*3/uL (ref 0.1–1.0)
Monocytes Relative: 4 %
Neutro Abs: 7.5 10*3/uL (ref 1.7–7.7)
Neutrophils Relative %: 86 %
Platelets: 214 10*3/uL (ref 150–400)
RBC: 3.5 MIL/uL — ABNORMAL LOW (ref 3.87–5.11)
RDW: 14.5 % (ref 11.5–15.5)
WBC: 8.9 10*3/uL (ref 4.0–10.5)
nRBC: 0 % (ref 0.0–0.2)

## 2019-03-19 LAB — LIPASE, BLOOD: Lipase: 69 U/L — ABNORMAL HIGH (ref 11–51)

## 2019-03-19 MED ORDER — METOCLOPRAMIDE HCL 5 MG/ML IJ SOLN
10.0000 mg | Freq: Once | INTRAMUSCULAR | Status: AC
  Administered 2019-03-19: 10 mg via INTRAVENOUS
  Filled 2019-03-19: qty 2

## 2019-03-19 MED ORDER — SODIUM CHLORIDE 0.9 % IV BOLUS
500.0000 mL | Freq: Once | INTRAVENOUS | Status: AC
  Administered 2019-03-19: 500 mL via INTRAVENOUS

## 2019-03-19 MED ORDER — PROMETHAZINE HCL 25 MG/ML IJ SOLN
6.2500 mg | Freq: Once | INTRAMUSCULAR | Status: AC
  Administered 2019-03-19: 6.25 mg via INTRAVENOUS
  Filled 2019-03-19: qty 1

## 2019-03-19 MED ORDER — FENTANYL CITRATE (PF) 100 MCG/2ML IJ SOLN
50.0000 ug | Freq: Once | INTRAMUSCULAR | Status: AC
  Administered 2019-03-19: 50 ug via INTRAVENOUS
  Filled 2019-03-19: qty 2

## 2019-03-19 MED ORDER — FENTANYL CITRATE (PF) 100 MCG/2ML IJ SOLN
50.0000 ug | Freq: Once | INTRAMUSCULAR | Status: AC
  Administered 2019-03-21: 50 ug via INTRAVENOUS
  Filled 2019-03-19 (×2): qty 2

## 2019-03-19 NOTE — ED Provider Notes (Signed)
Care assumed at shift change from [facility], [place], pending CT abdomen/pelvis and re-evaluation.. See her note for full HPI and workup. Briefly, pt presenting with nausea and sudden onset of generalized abdominal pain that began today. Also with diarrhea, however hx of the same. She has been given Zofran and phenergan here. Plan follow up ct, improve nausea. May need admit if no symptomatic improvement.  Physical Exam  BP (!) 155/55   Pulse 74   Temp 97.6 F (36.4 C) (Oral)   Resp 12   SpO2 100%   Physical Exam Vitals signs and nursing note reviewed.  Constitutional:      Appearance: She is well-developed. She is ill-appearing.     Comments: Pt moaning, appears flushed. "help me", "I'm so sick."  HENT:     Head: Normocephalic and atraumatic.  Eyes:     Conjunctiva/sclera: Conjunctivae normal.  Cardiovascular:     Rate and Rhythm: Normal rate and regular rhythm.  Pulmonary:     Effort: Pulmonary effort is normal. No respiratory distress.     Breath sounds: Normal breath sounds.  Abdominal:     General: Bowel sounds are normal.     Palpations: Abdomen is soft.     Tenderness: There is generalized abdominal tenderness. There is guarding.  Skin:    General: Skin is warm.  Neurological:     Mental Status: She is alert.  Psychiatric:        Behavior: Behavior normal.     ED Course/Procedures   Clinical Course as of Mar 21 2356  Mon Mar 19, 2019  2145 Called by nurse tech.  Patient is moaning in pain, describing pain to be in her mid abdomen.  She is still little bit nauseous.  Pain medication and antiemetics ordered.  At this time, doubt patient will drink oral contrast.  Will CT without.   [JR]  2332 CT abdomen shows no acute pathology.  There is distended urinary bladder.  New right lower lobe infiltrate consistent with pneumonia.  Reevaluated patient, she eyes any respiratory symptoms or fever.  States her diarrhea is not abnormal, she has frequent intermittent episodes of diarrhea.   Reassures me this is not new.  She has had nausea and vomiting.  Symptoms are somewhat improved, however she states she does need to urinate.  She denies any recent urinary incontinence or other urinary symptoms.  Patient continues to look ill.  X-ray ordered, COVID swab.  Discussed with Dr. Lita Mains, at this time, recommend consult hospitalist.   [JR]  Tue Mar 20, 2019  0001 Patient reports no recent antibiotics or contact with COVID positive people.   [JR]  0031 Dr. Alcario Drought with [facility] accepting, pending COVID.   [JR]    Clinical Course User Index [JR] Cassandra Mcmanaman, [place] N, PA-C    Procedures Results for orders placed or performed during the hospital encounter of 03/19/19  CBC with Differential  Result Value Ref Range   WBC 8.9 4.0 - 10.5 K/uL   RBC 3.50 (L) 3.87 - 5.11 MIL/uL   Hemoglobin 10.7 (L) 12.0 - 15.0 g/dL   HCT 32.3 (L) 36.0 - 46.0 %   MCV 92.3 80.0 - 100.0 fL   MCH 30.6 26.0 - 34.0 pg   MCHC 33.1 30.0 - 36.0 g/dL   RDW 14.5 11.5 - 15.5 %   Platelets 214 150 - 400 K/uL   nRBC 0.0 0.0 - 0.2 %   Neutrophils Relative % 86 %   Neutro Abs 7.5 1.7 - 7.7 K/uL  Lymphocytes Relative 10 %   Lymphs Abs 0.9 0.7 - 4.0 K/uL   Monocytes Relative 4 %   Monocytes Absolute 0.4 0.1 - 1.0 K/uL   Eosinophils Relative 0 %   Eosinophils Absolute 0.0 0.0 - 0.5 K/uL   Basophils Relative 0 %   Basophils Absolute 0.0 0.0 - 0.1 K/uL   Immature Granulocytes 0 %   Abs Immature Granulocytes 0.04 0.00 - 0.07 K/uL  Comprehensive metabolic panel  Result Value Ref Range   Sodium 136 135 - 145 mmol/L   Potassium 3.3 (L) 3.5 - 5.1 mmol/L   Chloride 96 (L) 98 - 111 mmol/L   CO2 24 22 - 32 mmol/L   Glucose, Bld 153 (H) 70 - 99 mg/dL   BUN 41 (H) 8 - 23 mg/dL   Creatinine, Ser 2.79 (H) 0.44 - 1.00 mg/dL   Calcium 8.4 (L) 8.9 - 10.3 mg/dL   Total Protein 8.3 (H) 6.5 - 8.1 g/dL   Albumin 3.8 3.5 - 5.0 g/dL   AST 32 15 - 41 U/L   ALT 19 0 - 44 U/L   Alkaline Phosphatase 68 38 - 126 U/L   Total  Bilirubin 0.5 0.3 - 1.2 mg/dL   GFR calc non Af Amer 15 (L) >60 mL/min   GFR calc Af Amer 18 (L) >60 mL/min   Anion gap 16 (H) 5 - 15  Lipase, blood  Result Value Ref Range   Lipase 69 (H) 11 - 51 U/L  Urinalysis, Routine w reflex microscopic  Result Value Ref Range   Color, Urine STRAW (A) YELLOW   APPearance CLEAR CLEAR   Specific Gravity, Urine 1.005 1.005 - 1.030   pH 6.0 5.0 - 8.0   Glucose, UA 50 (A) NEGATIVE mg/dL   Hgb urine dipstick NEGATIVE NEGATIVE   Bilirubin Urine NEGATIVE NEGATIVE   Ketones, ur NEGATIVE NEGATIVE mg/dL   Protein, ur NEGATIVE NEGATIVE mg/dL   Nitrite NEGATIVE NEGATIVE   Leukocytes,Ua NEGATIVE NEGATIVE   Ct Abdomen Pelvis Wo Contrast  Result Date: 03/19/2019 CLINICAL DATA:  Abdominal pain with diverticulitis suspected. EXAM: CT ABDOMEN AND PELVIS WITHOUT CONTRAST TECHNIQUE: Multidetector CT imaging of the abdomen and pelvis was performed following the standard protocol without IV contrast. COMPARISON:  06/20/2018. FINDINGS: Lower chest: There is airspace consolidation at the right lung base. There is atelectasis at the left lung base. The heart size is enlarged. Aortic calcifications are noted. Hepatobiliary: No focal liver abnormality is seen. No gallstones, gallbladder wall thickening, or biliary dilatation. Pancreas: Unremarkable. No pancreatic ductal dilatation or surrounding inflammatory changes. Spleen: A few splenic calcifications are noted. Adrenals/Urinary Tract: There are bilateral stable renal cortical cysts, some of which are hyperdense especially on the left. There is no hydronephrosis. There are no radiopaque obstructing kidney stones. The bladder is significantly distended. The adrenal glands are unremarkable. Stomach/Bowel: There is sigmoid diverticulosis without definite CT evidence of diverticulitis. There is no evidence of a small-bowel obstruction. The appendix is not reliably identified, however there are no inflammatory changes in the right  lower quadrant. The stomach is unremarkable. Vascular/Lymphatic: Aortic atherosclerosis. No enlarged abdominal or pelvic lymph nodes. Reproductive: Uterus and bilateral adnexa are unremarkable. Other: No abdominal wall hernia or abnormality. No abdominopelvic ascites. Musculoskeletal: No acute or significant osseous findings. IMPRESSION: 1. Findings concerning for a right lower lobe pneumonia. 2. Sigmoid diverticulosis without CT evidence of diverticulitis. 3. Distended urinary bladder. 4.  Aortic Atherosclerosis (ICD10-I70.0). Electronically Signed   By: Constance Holster M.D.   On:  03/19/2019 22:55   Dg Chest Port 1 View  Result Date: 03/19/2019 CLINICAL DATA:  Lower abdominal pain and nausea. EXAM: PORTABLE CHEST 1 VIEW COMPARISON:  Chest x-ray dated 06/02/2011 FINDINGS: The heart size is enlarged. There is mild volume overload without overt pulmonary edema. There are small bilateral pleural effusions. Multiple surgical staples project over the left axilla. There is no displaced fracture. No large focal area of consolidation. There is elevation of the right hemidiaphragm. IMPRESSION: 1. Cardiomegaly with mild volume overload. 2. Trace bilateral pleural effusions. Electronically Signed   By: Constance Holster M.D.   On: 03/19/2019 23:52    MDM  Patient with sudden onset of generalized abdominal pain, n/v, started today.  Care assumed pending CT abdomen pelvis.  CT revealing no acute intra-abdominal pathology, other than distended bladder.  There are findings consistent with new right lower lobe pneumonia.  On reevaluation, she denies any respiratory symptoms or fevers.  No known sick contacts.  Open swab ordered.  Patient did empty her bladder, however reports no significant improvement in symptoms.  Urine sent.  Patient discussed with Dr. Lita Mains. Given new diagnosis of pneumonia, persistent abdominal pain, and patient's ill appearance, believe it is best to admit to hospitalist service for further  management.   Samantha Mckenzie was evaluated in Emergency Department on 03/20/2019 for the symptoms described in the history of present illness. She was evaluated in the context of the global COVID-19 pandemic, which necessitated consideration that the patient might be at risk for infection with the SARS-CoV-2 virus that causes COVID-19. Institutional protocols and algorithms that pertain to the evaluation of patients at risk for COVID-19 are in a state of rapid change based on information released by regulatory bodies including the CDC and federal and state organizations. These policies and algorithms were followed during the patient's care in the ED.    Adama Ivins, [place] N, PA-C 03/22/19 2357    Julianne Rice, MD 03/24/19 [phone]

## 2019-03-19 NOTE — ED Triage Notes (Signed)
TC to Pt's Husband  Called with update on care .

## 2019-03-19 NOTE — ED Notes (Signed)
Pt vomited large amt and pooped herself

## 2019-03-19 NOTE — ED Notes (Signed)
[name] [phone] pts husband wants an update

## 2019-03-19 NOTE — ED Notes (Signed)
Pt de-sats to ~74% while resting. Pulse ox changed and pt inclined. No change. Placed on 3LPM via [place]. Sat increases to 99%

## 2019-03-19 NOTE — ED Triage Notes (Signed)
Lower abd pain and nausea x 1 1/2 hours tender to palp

## 2019-03-19 NOTE — ED Provider Notes (Signed)
[facility]ER[facility] DEPARTMENT Provider Note   CSN: [id number] Arrival date & time: 03/19/19  1845    History   Chief Complaint Chief Complaint  Patient presents with  . Abdominal Pain    HPI Samantha Mckenzie is a 83 y.o. female with history of CKD, GERD, HLD, HTN presents for evaluation of acute onset, progressively worsening generalized abdominal pain with nausea beginning earlier today.  Denies fever, cough, shortness of breath, chest pain.  No aggravating or alleviating factors noted.  Pain is constant, sharp, primarily right-sided but radiates to the left at times.  She notes persistent nausea but no vomiting today.  Also has had a few episodes of watery nonbloody diarrhea.  Reports feeling very weak and lightheaded.     The history is provided by the patient.    Past Medical History:  Diagnosis Date  . Allergic rhinitis   . Anemia   . Breast CA ([place])    surgery, chemo, XRT; had peripheral neuropathy (imbalance at times) after chemo  . Cellulitis    Left arm, recurrent   . Cellulitis LEFT arm recurrent 12/08/2011  . Chronic renal disease, stage IV (HCC)    Dr. Moshe Cipro  . Depression   . Fatigue   . GERD (gastroesophageal reflux disease)    gastritis, EGD 02/2007  . Gout   . Hyperlipidemia   . Hypertension   . Macular degeneration 2011  . Normal cardiac stress test 12/2014  . Osteoarthritis   . Renal insufficiency     Patient Active Problem List   Diagnosis Date Noted  . CAP (community acquired pneumonia) 03/20/2019  . Nausea vomiting and diarrhea 03/20/2019  . Acute respiratory failure with hypoxia ([place]) 03/20/2019  . CKD (chronic kidney disease), stage IV ([place]) 03/20/2019  . AKI (acute kidney injury) ([place]) 03/20/2019  . Lactic acidosis 03/20/2019  . Diabetic peripheral neuropathy associated with type 2 diabetes mellitus ([place]) 03/20/2019  . Hypomagnesemia 06/21/2018  . PCP NOTES >>> 07/11/2015  . Depression   . VITAMIN B12 DEFICIENCY  04/01/2008  . GERD 12/19/2007  . NEOP, MALIGNANT, FEMALE BREAST NOS 07/07/2007  . RENAL INSUFFICIENCY 07/07/2007  . Osteoarthritis 07/07/2007  . Diabetes mellitus type 2 with complications ([place]) 05/24/4817  . Hyperlipidemia 02/28/2007  . GOUT 02/28/2007  . Normocytic anemia 02/28/2007  . Essential hypertension 02/28/2007    Past Surgical History:  Procedure Laterality Date  . cataracts bilaterally  1 -2012  . LUNG BIOPSY  1999   neg  . MASTECTOMY Left 1999   and lymphnodes      OB History   No obstetric history on file.      Home Medications    Prior to Admission medications   Medication Sig Start Date End Date Taking? Authorizing Provider  allopurinol (ZYLOPRIM) 100 MG tablet Take 1 tablet (100 mg total) by mouth daily. 03/19/19  Yes Paz, Alda Berthold, MD  amLODipine (NORVASC) 10 MG tablet Take 1 tablet (10 mg total) by mouth daily. 03/06/19  Yes Colon Branch, MD  aspirin 81 MG tablet Take 81 mg by mouth daily.     Yes [provider]  atorvastatin (LIPITOR) 20 MG tablet Take 1 tablet (20 mg total) by mouth daily. 01/31/19  Yes Paz, Alda Berthold, MD  Cholecalciferol (VITAMIN D3) 1000 UNITS CAPS Take 1 capsule by mouth daily.    Yes [provider]  furosemide (LASIX) 40 MG tablet Take 40 mg by mouth 2 (two) times daily. 03/16/19  Yes [provider]  gabapentin (NEURONTIN) 100 MG capsule Take 2 capsules (200 mg total) by mouth at bedtime. 12/22/18  Yes Paz, Alda Berthold, MD  metoprolol tartrate (LOPRESSOR) 25 MG tablet Take 1 tablet (25 mg total) by mouth 2 (two) times daily. 07/11/18  Yes Paz, Alda Berthold, MD  Multiple Vitamins-Minerals (CENTRUM SILVER PO) Take 1 tablet by mouth daily.    Yes [provider]  omeprazole (PRILOSEC OTC) 20 MG tablet Take 1-2 tablets (20-40 mg total) by mouth daily. 12/26/18  Yes Paz, Alda Berthold, MD  ondansetron (ZOFRAN ODT) 4 MG disintegrating tablet Take 1 tablet (4 mg total) by mouth every 8 (eight) hours as needed for nausea or vomiting.  06/16/18  Yes Quintella Reichert, MD  vitamin B-12 (CYANOCOBALAMIN) 1000 MCG tablet Take 1,000 mcg by mouth daily.     Yes [provider]    Family History Family History  Problem Relation Age of Onset  . Breast cancer Other        ? of   . Heart attack Father 61  . Diabetes Sister        2  . Hypertension Sister   . Colon cancer Neg Hx   . Stomach cancer Neg Hx     Social History Social History   Tobacco Use  . Smoking status: Never Smoker  . Smokeless tobacco: Never Used  Substance Use Topics  . Alcohol use: Yes    Alcohol/week: 0.0 standard drinks    Comment: rarely  . Drug use: No     Allergies   Codeine; Morphine; Oxycodone-acetaminophen; Sulfonamide derivatives; and Tramadol hcl   Review of Systems Review of Systems  Constitutional: Negative for chills and fever.  Respiratory: Negative for cough and shortness of breath.   Cardiovascular: Negative for chest pain.  Gastrointestinal: Positive for abdominal pain, diarrhea, nausea and vomiting. Negative for blood in stool and constipation.  All other systems reviewed and are negative.    Physical Exam Updated Vital Signs BP (!) 166/60   Pulse (!) 101   Temp 98.7 F (37.1 C)   Resp 17   Ht 5' 2"  (1.575 m)   Wt 62.2 kg   SpO2 100%   BMI 25.08 kg/m   Physical Exam Vitals signs and nursing note reviewed.  Constitutional:      General: She is not in acute distress.    Appearance: She is well-developed.     Comments: Appears ill  HENT:     Head: Normocephalic and atraumatic.  Eyes:     General:        Right eye: No discharge.        Left eye: No discharge.     Conjunctiva/sclera: Conjunctivae normal.  Neck:     Vascular: No JVD.     Trachea: No tracheal deviation.  Cardiovascular:     Rate and Rhythm: Normal rate and regular rhythm.  Pulmonary:     Effort: Pulmonary effort is normal.     Breath sounds: Normal breath sounds.  Abdominal:     General: Abdomen is protuberant. Bowel sounds  are normal. There is no distension.     Palpations: Abdomen is soft.     Tenderness: There is generalized abdominal tenderness. There is no guarding or rebound.     Comments: Actively vomiting yellowish nonbilious emesis in the room  Skin:    General: Skin is warm and dry.     Findings: No erythema.  Neurological:     Mental Status: She is alert.  Psychiatric:        Behavior: Behavior normal.      ED Treatments / Results  Labs (all labs ordered are listed, but only abnormal results are displayed) Labs Reviewed  CBC WITH DIFFERENTIAL/PLATELET - Abnormal; Notable for the following components:      Result Value   RBC 3.50 (*)    Hemoglobin 10.7 (*)    HCT 32.3 (*)    All other components within normal limits  COMPREHENSIVE METABOLIC PANEL - Abnormal; Notable for the following components:   Potassium 3.3 (*)    Chloride 96 (*)    Glucose, Bld 153 (*)    BUN 41 (*)    Creatinine, Ser 2.79 (*)    Calcium 8.4 (*)    Total Protein 8.3 (*)    GFR calc non Af Amer 15 (*)    GFR calc Af Amer 18 (*)    Anion gap 16 (*)    All other components within normal limits  LIPASE, BLOOD - Abnormal; Notable for the following components:   Lipase 69 (*)    All other components within normal limits  URINALYSIS, ROUTINE W REFLEX MICROSCOPIC - Abnormal; Notable for the following components:   Color, Urine STRAW (*)    Glucose, UA 50 (*)    All other components within normal limits  LACTIC ACID, PLASMA - Abnormal; Notable for the following components:   Lactic Acid, Venous 2.2 (*)    All other components within normal limits  LACTIC ACID, PLASMA - Abnormal; Notable for the following components:   Lactic Acid, Venous 2.0 (*)    All other components within normal limits  BASIC METABOLIC PANEL - Abnormal; Notable for the following components:   Potassium 3.2 (*)    Glucose, Bld 243 (*)    BUN 36 (*)    Creatinine, Ser 2.45 (*)    Calcium 8.3 (*)    GFR calc non Af Amer 18 (*)    GFR calc  Af Amer 21 (*)    Anion gap 16 (*)    All other components within normal limits  CBC - Abnormal; Notable for the following components:   WBC 18.0 (*)    RBC 3.54 (*)    Hemoglobin 11.0 (*)    HCT 33.6 (*)    All other components within normal limits  MAGNESIUM - Abnormal; Notable for the following components:   Magnesium 1.2 (*)    All other components within normal limits  GLUCOSE, CAPILLARY - Abnormal; Notable for the following components:   Glucose-Capillary 168 (*)    All other components within normal limits  GLUCOSE, CAPILLARY - Abnormal; Notable for the following components:   Glucose-Capillary 145 (*)    All other components within normal limits  SARS CORONAVIRUS 2 (HOSPITAL ORDER, [facility])  RESPIRATORY PANEL BY PCR  CULTURE, BLOOD (ROUTINE X 2)  CULTURE, BLOOD (ROUTINE X 2)  EXPECTORATED SPUTUM ASSESSMENT W REFEX TO RESP CULTURE  GRAM STAIN  HIV ANTIBODY (ROUTINE TESTING W REFLEX)  STREP PNEUMONIAE URINARY ANTIGEN  CBC  BASIC METABOLIC PANEL  MAGNESIUM    EKG EKG Interpretation  Date/Time:  Monday March 19 2019 21:07:06 EDT Ventricular Rate:  68 PR Interval:    QRS Duration: 101 QT Interval:  434 QTC Calculation: 462 R Axis:   10 Text Interpretation:  Sinus rhythm Borderline prolonged PR interval Left ventricular hypertrophy Inferior infarct, age indeterminate Confirmed by Ripley Fraise [phone]) on 03/19/2019 11:19:39 PM   Radiology  Ct Abdomen Pelvis Wo Contrast  Result Date: 03/19/2019 CLINICAL DATA:  Abdominal pain with diverticulitis suspected. EXAM: CT ABDOMEN AND PELVIS WITHOUT CONTRAST TECHNIQUE: Multidetector CT imaging of the abdomen and pelvis was performed following the standard protocol without IV contrast. COMPARISON:  06/20/2018. FINDINGS: Lower chest: There is airspace consolidation at the right lung base. There is atelectasis at the left lung base. The heart size is enlarged. Aortic calcifications are noted.  Hepatobiliary: No focal liver abnormality is seen. No gallstones, gallbladder wall thickening, or biliary dilatation. Pancreas: Unremarkable. No pancreatic ductal dilatation or surrounding inflammatory changes. Spleen: A few splenic calcifications are noted. Adrenals/Urinary Tract: There are bilateral stable renal cortical cysts, some of which are hyperdense especially on the left. There is no hydronephrosis. There are no radiopaque obstructing kidney stones. The bladder is significantly distended. The adrenal glands are unremarkable. Stomach/Bowel: There is sigmoid diverticulosis without definite CT evidence of diverticulitis. There is no evidence of a small-bowel obstruction. The appendix is not reliably identified, however there are no inflammatory changes in the right lower quadrant. The stomach is unremarkable. Vascular/Lymphatic: Aortic atherosclerosis. No enlarged abdominal or pelvic lymph nodes. Reproductive: Uterus and bilateral adnexa are unremarkable. Other: No abdominal wall hernia or abnormality. No abdominopelvic ascites. Musculoskeletal: No acute or significant osseous findings. IMPRESSION: 1. Findings concerning for a right lower lobe pneumonia. 2. Sigmoid diverticulosis without CT evidence of diverticulitis. 3. Distended urinary bladder. 4.  Aortic Atherosclerosis (ICD10-I70.0). Electronically Signed   By: Constance Holster M.D.   On: 03/19/2019 22:55   Dg Chest Port 1 View  Result Date: 03/19/2019 CLINICAL DATA:  Lower abdominal pain and nausea. EXAM: PORTABLE CHEST 1 VIEW COMPARISON:  Chest x-ray dated 06/02/2011 FINDINGS: The heart size is enlarged. There is mild volume overload without overt pulmonary edema. There are small bilateral pleural effusions. Multiple surgical staples project over the left axilla. There is no displaced fracture. No large focal area of consolidation. There is elevation of the right hemidiaphragm. IMPRESSION: 1. Cardiomegaly with mild volume overload. 2. Trace  bilateral pleural effusions. Electronically Signed   By: Constance Holster M.D.   On: 03/19/2019 23:52    Procedures Procedures (including critical care time)  Medications Ordered in ED Medications  fentaNYL (SUBLIMAZE) injection 50 mcg (50 mcg Intravenous Canceled Entry 03/19/19 2209)  enoxaparin (LOVENOX) injection 30 mg (30 mg Subcutaneous Given 03/20/19 1026)  cefTRIAXone (ROCEPHIN) 1 g in sodium chloride 0.9 % 100 mL IVPB (has no administration in time range)  azithromycin (ZITHROMAX) tablet 500 mg (has no administration in time range)  ondansetron (ZOFRAN) injection 4 mg (has no administration in time range)  0.9 %  sodium chloride infusion ( Intravenous New Bag/Given 03/20/19 1903)  amLODipine (NORVASC) tablet 10 mg (10 mg Oral Given 03/20/19 1028)  atorvastatin (LIPITOR) tablet 20 mg (20 mg Oral Given 03/20/19 1028)  gabapentin (NEURONTIN) capsule 200 mg (has no administration in time range)  metoprolol tartrate (LOPRESSOR) tablet 25 mg (25 mg Oral Given 03/20/19 1027)  allopurinol (ZYLOPRIM) tablet 100 mg (100 mg Oral Given 03/20/19 1028)  HYDROmorphone (DILAUDID) injection 0.5-1 mg (1 mg Intravenous Given 03/20/19 1028)  aspirin chewable tablet 81 mg (81 mg Oral Given 03/20/19 1028)  insulin aspart (novoLOG) injection 0-9 Units (1 Units Subcutaneous Given 03/20/19 1903)  insulin aspart (novoLOG) injection 0-5 Units (has no administration in time range)  insulin aspart (novoLOG) injection 3 Units (3 Units Subcutaneous Given 03/20/19 1904)  promethazine (PHENERGAN) injection 6.25 mg (6.25 mg Intravenous Given 03/19/19 1915)  sodium chloride  0.9 % bolus 500 mL (0 mLs Intravenous Stopped 03/20/19 0252)  metoCLOPramide (REGLAN) injection 10 mg (10 mg Intravenous Given 03/19/19 2158)  fentaNYL (SUBLIMAZE) injection 50 mcg (50 mcg Intravenous Given 03/19/19 2210)  cefTRIAXone (ROCEPHIN) 1 g in sodium chloride 0.9 % 100 mL IVPB (0 g Intravenous Stopped 03/20/19 0359)  azithromycin (ZITHROMAX) 500 mg in sodium  chloride 0.9 % 250 mL IVPB (0 mg Intravenous Stopped 03/20/19 0535)  potassium chloride 10 mEq in 100 mL IVPB (0 mEq Intravenous Stopped 03/20/19 0704)  potassium chloride 10 mEq in 100 mL IVPB (0 mEq Intravenous Stopping Infusion hung by another clincian 03/20/19 1953)  magnesium sulfate IVPB 4 g 100 mL (0 g Intravenous Stopping Infusion hung by another clincian 03/20/19 1953)     Initial Impression / Assessment and Plan / ED Course  I have reviewed the triage vital signs and the nursing notes.  Pertinent labs & imaging results that were available during my care of the patient were reviewed by me and considered in my medical decision making (see chart for details).  Patient presenting for evaluation of abdominal pain, nausea, vomiting, diarrhea.  She is afebrile, somewhat hypertensive in the ED.  Vital signs overall stable however she does appear quite ill and uncomfortable.  She has generalized tenderness to palpation with no peritoneal signs.  She is actively vomiting on my assessment, some improvement with Phenergan.  Lab work reviewed by me shows leukocytosis, mild anemia, renal insufficiency which appears to be at patient's baseline.  Will obtain CT of the abdomen pelvis without contrast for further evaluation.  9:30PM Signed out to oncoming provider PA Quentin Cornwall.  Pending imaging.  Patient was not able to tolerate p.o. contrast fluid, will give Reglan for nausea and vomiting.1 she continues to appear somewhat ill.  If her imaging is reassuring and her symptoms are under control, she may be stable for discharge home.  Otherwise she would likely require admission for further evaluation and management.  Final Clinical Impressions(s) / ED Diagnoses   Final diagnoses:  Community acquired pneumonia of right lower lobe of lung ([place])  Generalized abdominal pain  Nausea vomiting and diarrhea    ED Discharge Orders    None       Debroah Baller 03/20/19 2020    Julianne Rice, MD  03/24/19 [phone]

## 2019-03-20 ENCOUNTER — Encounter (HOSPITAL_COMMUNITY): Payer: Self-pay | Admitting: Internal Medicine

## 2019-03-20 DIAGNOSIS — R112 Nausea with vomiting, unspecified: Secondary | ICD-10-CM | POA: Diagnosis not present

## 2019-03-20 DIAGNOSIS — J9601 Acute respiratory failure with hypoxia: Secondary | ICD-10-CM | POA: Diagnosis present

## 2019-03-20 DIAGNOSIS — D649 Anemia, unspecified: Secondary | ICD-10-CM

## 2019-03-20 DIAGNOSIS — E872 Acidosis, unspecified: Secondary | ICD-10-CM | POA: Diagnosis present

## 2019-03-20 DIAGNOSIS — R1031 Right lower quadrant pain: Secondary | ICD-10-CM | POA: Diagnosis not present

## 2019-03-20 DIAGNOSIS — J181 Lobar pneumonia, unspecified organism: Secondary | ICD-10-CM

## 2019-03-20 DIAGNOSIS — K219 Gastro-esophageal reflux disease without esophagitis: Secondary | ICD-10-CM | POA: Diagnosis present

## 2019-03-20 DIAGNOSIS — E118 Type 2 diabetes mellitus with unspecified complications: Secondary | ICD-10-CM

## 2019-03-20 DIAGNOSIS — K8080 Other cholelithiasis without obstruction: Secondary | ICD-10-CM | POA: Diagnosis not present

## 2019-03-20 DIAGNOSIS — N184 Chronic kidney disease, stage 4 (severe): Secondary | ICD-10-CM | POA: Diagnosis not present

## 2019-03-20 DIAGNOSIS — K5730 Diverticulosis of large intestine without perforation or abscess without bleeding: Secondary | ICD-10-CM | POA: Diagnosis not present

## 2019-03-20 DIAGNOSIS — I10 Essential (primary) hypertension: Secondary | ICD-10-CM | POA: Diagnosis not present

## 2019-03-20 DIAGNOSIS — R4702 Dysphasia: Secondary | ICD-10-CM | POA: Diagnosis present

## 2019-03-20 DIAGNOSIS — R1084 Generalized abdominal pain: Secondary | ICD-10-CM | POA: Diagnosis not present

## 2019-03-20 DIAGNOSIS — E1122 Type 2 diabetes mellitus with diabetic chronic kidney disease: Secondary | ICD-10-CM | POA: Diagnosis present

## 2019-03-20 DIAGNOSIS — E876 Hypokalemia: Secondary | ICD-10-CM | POA: Diagnosis present

## 2019-03-20 DIAGNOSIS — J189 Pneumonia, unspecified organism: Secondary | ICD-10-CM | POA: Diagnosis present

## 2019-03-20 DIAGNOSIS — Z794 Long term (current) use of insulin: Secondary | ICD-10-CM | POA: Diagnosis not present

## 2019-03-20 DIAGNOSIS — N179 Acute kidney failure, unspecified: Secondary | ICD-10-CM | POA: Diagnosis present

## 2019-03-20 DIAGNOSIS — Z853 Personal history of malignant neoplasm of breast: Secondary | ICD-10-CM | POA: Diagnosis not present

## 2019-03-20 DIAGNOSIS — A419 Sepsis, unspecified organism: Secondary | ICD-10-CM | POA: Diagnosis present

## 2019-03-20 DIAGNOSIS — E785 Hyperlipidemia, unspecified: Secondary | ICD-10-CM

## 2019-03-20 DIAGNOSIS — F4321 Adjustment disorder with depressed mood: Secondary | ICD-10-CM | POA: Diagnosis not present

## 2019-03-20 DIAGNOSIS — I517 Cardiomegaly: Secondary | ICD-10-CM | POA: Diagnosis present

## 2019-03-20 DIAGNOSIS — Z8249 Family history of ischemic heart disease and other diseases of the circulatory system: Secondary | ICD-10-CM | POA: Diagnosis not present

## 2019-03-20 DIAGNOSIS — R197 Diarrhea, unspecified: Secondary | ICD-10-CM | POA: Diagnosis not present

## 2019-03-20 DIAGNOSIS — E1142 Type 2 diabetes mellitus with diabetic polyneuropathy: Secondary | ICD-10-CM | POA: Diagnosis present

## 2019-03-20 DIAGNOSIS — K8020 Calculus of gallbladder without cholecystitis without obstruction: Secondary | ICD-10-CM | POA: Diagnosis present

## 2019-03-20 DIAGNOSIS — I129 Hypertensive chronic kidney disease with stage 1 through stage 4 chronic kidney disease, or unspecified chronic kidney disease: Secondary | ICD-10-CM | POA: Diagnosis present

## 2019-03-20 DIAGNOSIS — R45851 Suicidal ideations: Secondary | ICD-10-CM | POA: Diagnosis not present

## 2019-03-20 DIAGNOSIS — Z20828 Contact with and (suspected) exposure to other viral communicable diseases: Secondary | ICD-10-CM | POA: Diagnosis present

## 2019-03-20 DIAGNOSIS — M109 Gout, unspecified: Secondary | ICD-10-CM | POA: Diagnosis not present

## 2019-03-20 DIAGNOSIS — G252 Other specified forms of tremor: Secondary | ICD-10-CM | POA: Diagnosis present

## 2019-03-20 DIAGNOSIS — K5900 Constipation, unspecified: Secondary | ICD-10-CM | POA: Diagnosis not present

## 2019-03-20 LAB — BASIC METABOLIC PANEL
Anion gap: 16 — ABNORMAL HIGH (ref 5–15)
BUN: 36 mg/dL — ABNORMAL HIGH (ref 8–23)
CO2: 23 mmol/L (ref 22–32)
Calcium: 8.3 mg/dL — ABNORMAL LOW (ref 8.9–10.3)
Chloride: 100 mmol/L (ref 98–111)
Creatinine, Ser: 2.45 mg/dL — ABNORMAL HIGH (ref 0.44–1.00)
GFR calc Af Amer: 21 mL/min — ABNORMAL LOW (ref >60)
GFR calc non Af Amer: 18 mL/min — ABNORMAL LOW (ref >60)
Glucose, Bld: 243 mg/dL — ABNORMAL HIGH (ref 70–99)
Potassium: 3.2 mmol/L — ABNORMAL LOW (ref 3.5–5.1)
Sodium: 139 mmol/L (ref 135–145)

## 2019-03-20 LAB — RESPIRATORY PANEL BY PCR

## 2019-03-20 LAB — CBC
HCT: 33.6 % — ABNORMAL LOW (ref 36.0–46.0)
Hemoglobin: 11 g/dL — ABNORMAL LOW (ref 12.0–15.0)
MCH: 31.1 pg (ref 26.0–34.0)
MCHC: 32.7 g/dL (ref 30.0–36.0)
MCV: 94.9 fL (ref 80.0–100.0)
Platelets: 187 10*3/uL (ref 150–400)
RBC: 3.54 MIL/uL — ABNORMAL LOW (ref 3.87–5.11)
RDW: 14.6 % (ref 11.5–15.5)
WBC: 18 10*3/uL — ABNORMAL HIGH (ref 4.0–10.5)
nRBC: 0 % (ref 0.0–0.2)

## 2019-03-20 LAB — GLUCOSE, CAPILLARY
Glucose-Capillary: 168 mg/dL — ABNORMAL HIGH (ref 70–99)
Glucose-Capillary: 145 mg/dL — ABNORMAL HIGH (ref 70–99)
Glucose-Capillary: 123 mg/dL — ABNORMAL HIGH (ref 70–99)

## 2019-03-20 LAB — LACTIC ACID, PLASMA
Lactic Acid, Venous: 2.2 mmol/L (ref 0.5–1.9)
Lactic Acid, Venous: 2 mmol/L (ref 0.5–1.9)

## 2019-03-20 LAB — MAGNESIUM: Magnesium: 1.2 mg/dL — ABNORMAL LOW (ref 1.7–2.4)

## 2019-03-20 LAB — SARS CORONAVIRUS 2 BY RT PCR (HOSPITAL ORDER, PERFORMED IN ~~LOC~~ HOSPITAL LAB): SARS Coronavirus 2: NEGATIVE

## 2019-03-20 LAB — HIV ANTIBODY (ROUTINE TESTING W REFLEX): HIV Screen 4th Generation wRfx: NONREACTIVE

## 2019-03-20 LAB — STREP PNEUMONIAE URINARY ANTIGEN: Strep Pneumo Urinary Antigen: NEGATIVE

## 2019-03-20 MED ORDER — ASPIRIN 81 MG PO TABS: 81.0000 mg | ORAL_TABLET | Freq: Every day | ORAL | Status: DC

## 2019-03-20 MED ORDER — POTASSIUM CHLORIDE 10 MEQ/100ML IV SOLN
10.0000 meq | INTRAVENOUS | Status: AC
  Administered 2019-03-20 (×2): 10 meq via INTRAVENOUS
  Filled 2019-03-20 (×2): qty 100

## 2019-03-20 MED ORDER — SODIUM CHLORIDE 0.9 % IV SOLN
1.0000 g | INTRAVENOUS | Status: AC
  Administered 2019-03-21 – 2019-03-27 (×7): 1 g via INTRAVENOUS
  Filled 2019-03-20 (×7): qty 10

## 2019-03-20 MED ORDER — INSULIN ASPART 100 UNIT/ML ~~LOC~~ SOLN: 0.0000 [IU] | Freq: Every day | SUBCUTANEOUS | Status: DC

## 2019-03-20 MED ORDER — MAGNESIUM SULFATE 4 GM/100ML IV SOLN
4.0000 g | Freq: Once | INTRAVENOUS | Status: AC
  Administered 2019-03-20: 4 g via INTRAVENOUS
  Filled 2019-03-20: qty 100

## 2019-03-20 MED ORDER — INSULIN ASPART 100 UNIT/ML ~~LOC~~ SOLN
3.0000 [IU] | Freq: Three times a day (TID) | SUBCUTANEOUS | Status: DC
  Administered 2019-03-20 – 2019-03-26 (×8): 3 [IU] via SUBCUTANEOUS

## 2019-03-20 MED ORDER — AMLODIPINE BESYLATE 10 MG PO TABS
10.0000 mg | ORAL_TABLET | Freq: Every day | ORAL | Status: DC
  Administered 2019-03-20 – 2019-03-27 (×8): 10 mg via ORAL
  Filled 2019-03-20 (×9): qty 1

## 2019-03-20 MED ORDER — INSULIN ASPART 100 UNIT/ML ~~LOC~~ SOLN
0.0000 [IU] | Freq: Three times a day (TID) | SUBCUTANEOUS | Status: DC
  Administered 2019-03-20: 2 [IU] via SUBCUTANEOUS
  Administered 2019-03-20: 1 [IU] via SUBCUTANEOUS
  Administered 2019-03-21: 2 [IU] via SUBCUTANEOUS
  Administered 2019-03-22: 1 [IU] via SUBCUTANEOUS

## 2019-03-20 MED ORDER — ASPIRIN 81 MG PO CHEW
81.0000 mg | CHEWABLE_TABLET | Freq: Every day | ORAL | Status: DC
  Administered 2019-03-20 – 2019-03-27 (×8): 81 mg via ORAL
  Filled 2019-03-20 (×9): qty 1

## 2019-03-20 MED ORDER — AZITHROMYCIN 250 MG PO TABS
500.0000 mg | ORAL_TABLET | ORAL | Status: AC
  Administered 2019-03-21 – 2019-03-27 (×7): 500 mg via ORAL
  Filled 2019-03-20 (×7): qty 2

## 2019-03-20 MED ORDER — ALLOPURINOL 100 MG PO TABS
100.0000 mg | ORAL_TABLET | Freq: Every day | ORAL | Status: DC
  Administered 2019-03-20 – 2019-03-27 (×8): 100 mg via ORAL
  Filled 2019-03-20 (×10): qty 1

## 2019-03-20 MED ORDER — HYDROMORPHONE HCL 1 MG/ML IJ SOLN
0.5000 mg | INTRAMUSCULAR | Status: DC | PRN
  Administered 2019-03-20: 1 mg via INTRAVENOUS
  Administered 2019-03-20: 0.5 mg via INTRAVENOUS
  Administered 2019-03-20 – 2019-03-23 (×7): 1 mg via INTRAVENOUS
  Filled 2019-03-20 (×10): qty 1

## 2019-03-20 MED ORDER — ATORVASTATIN CALCIUM 10 MG PO TABS
20.0000 mg | ORAL_TABLET | Freq: Every day | ORAL | Status: DC
  Administered 2019-03-20 – 2019-03-27 (×8): 20 mg via ORAL
  Filled 2019-03-20 (×9): qty 2

## 2019-03-20 MED ORDER — METOPROLOL TARTRATE 25 MG PO TABS
25.0000 mg | ORAL_TABLET | Freq: Two times a day (BID) | ORAL | Status: DC
  Administered 2019-03-20 – 2019-03-27 (×15): 25 mg via ORAL
  Filled 2019-03-20 (×15): qty 1

## 2019-03-20 MED ORDER — SODIUM CHLORIDE 0.9 % IV SOLN
INTRAVENOUS | Status: DC
  Administered 2019-03-20 – 2019-03-25 (×8): via INTRAVENOUS
  Administered 2019-03-25: 75 mL/h via INTRAVENOUS
  Administered 2019-03-26 (×2): via INTRAVENOUS

## 2019-03-20 MED ORDER — ENOXAPARIN SODIUM 30 MG/0.3ML ~~LOC~~ SOLN
30.0000 mg | SUBCUTANEOUS | Status: DC
  Administered 2019-03-20 – 2019-03-27 (×8): 30 mg via SUBCUTANEOUS
  Filled 2019-03-20 (×8): qty 0.3

## 2019-03-20 MED ORDER — SODIUM CHLORIDE 0.9 % IV SOLN
500.0000 mg | Freq: Once | INTRAVENOUS | Status: AC
  Administered 2019-03-20: 500 mg via INTRAVENOUS
  Filled 2019-03-20: qty 500

## 2019-03-20 MED ORDER — GABAPENTIN 100 MG PO CAPS
200.0000 mg | ORAL_CAPSULE | Freq: Every day | ORAL | Status: DC
  Administered 2019-03-20 – 2019-03-26 (×7): 200 mg via ORAL
  Filled 2019-03-20 (×7): qty 2

## 2019-03-20 MED ORDER — POTASSIUM CHLORIDE 10 MEQ/100ML IV SOLN
10.0000 meq | INTRAVENOUS | Status: AC
  Administered 2019-03-20 (×4): 10 meq via INTRAVENOUS
  Filled 2019-03-20 (×4): qty 100

## 2019-03-20 MED ORDER — SODIUM CHLORIDE 0.9 % IV SOLN
1.0000 g | Freq: Once | INTRAVENOUS | Status: AC
  Administered 2019-03-20: 1 g via INTRAVENOUS
  Filled 2019-03-20: qty 10

## 2019-03-20 MED ORDER — ONDANSETRON HCL 4 MG/2ML IJ SOLN
4.0000 mg | Freq: Four times a day (QID) | INTRAMUSCULAR | Status: DC | PRN
  Administered 2019-03-21 – 2019-03-26 (×4): 4 mg via INTRAVENOUS
  Filled 2019-03-20 (×5): qty 2

## 2019-03-20 NOTE — H&P (Signed)
History and Physical    Samantha Mckenzie:[id number] DOB: [birth year] DOA: 03/19/2019  PCP: Colon Branch, MD  Patient coming from: Home  I have personally briefly reviewed patient's old medical records in [place]  Chief Complaint: N/V/D, abd pain  HPI: Samantha Mckenzie is a 83 y.o. female with medical history significant of CKD stage 4, HTN.  Patient presents to the ED with c/o abd pain, N/V/D.  Symptoms onset suddenly about 1.5h PTA in the ED, persistent, nothing makes better or worse.  Symptoms are severe.  Abd pain is generalized.   ED Course: While in the noted to have O2 sats in the 70s, improved with 3L via [place].  CT abd/pelvis shows RLL PNA, no acute intra abd process.  Started on rocephin and azithromycin.  COVID is negative surprisingly enough.  Hospitalist asked to admit.   Review of Systems: As per HPI otherwise 10 point review of systems negative.   Past Medical History:  Diagnosis Date   Allergic rhinitis    Anemia    Breast CA ([place])    surgery, chemo, XRT; had peripheral neuropathy (imbalance at times) after chemo   Cellulitis    Left arm, recurrent    Chronic renal disease, stage IV ([place])    Dr. Moshe Cipro   Depression    Fatigue    GERD (gastroesophageal reflux disease)    gastritis, EGD 02/2007   Gout    Hyperlipidemia    Hypertension    Macular degeneration 2011   Normal cardiac stress test 12/2014   Osteoarthritis    Renal insufficiency     Past Surgical History:  Procedure Laterality Date   cataracts bilaterally  1 -2012   [place]   neg   MASTECTOMY Left 1999   and lymphnodes      reports that she has never smoked. She has never used smokeless tobacco. She reports current alcohol use. She reports that she does not use drugs.  Allergies  Allergen Reactions   Codeine Nausea And Vomiting   Morphine Nausea And Vomiting   Oxycodone-Acetaminophen Nausea And Vomiting   Sulfonamide Derivatives Nausea And  Vomiting   Tramadol Hcl Nausea And Vomiting    Family History  Problem Relation Age of Onset   Breast cancer Other        ? of    Heart attack Father 79   Diabetes Sister        2   Hypertension Sister    Colon cancer Neg Hx    Stomach cancer Neg Hx      Prior to Admission medications   Medication Sig Start Date End Date Taking? Authorizing Provider  allopurinol (ZYLOPRIM) 100 MG tablet Take 1 tablet (100 mg total) by mouth daily. 03/19/19  Yes Paz, Alda Berthold, MD  amLODipine (NORVASC) 10 MG tablet Take 1 tablet (10 mg total) by mouth daily. 03/06/19  Yes Colon Branch, MD  aspirin 81 MG tablet Take 81 mg by mouth daily.     Yes [provider]  atorvastatin (LIPITOR) 20 MG tablet Take 1 tablet (20 mg total) by mouth daily. 01/31/19  Yes Paz, Alda Berthold, MD  Cholecalciferol (VITAMIN D3) 1000 UNITS CAPS Take 1 capsule by mouth daily.    Yes [provider]  furosemide (LASIX) 40 MG tablet Take 40 mg by mouth 2 (two) times daily. 03/16/19  Yes [provider]  gabapentin (NEURONTIN) 100 MG capsule Take 2 capsules (200 mg total) by mouth  at bedtime. 12/22/18  Yes Paz, Alda Berthold, MD  metoprolol tartrate (LOPRESSOR) 25 MG tablet Take 1 tablet (25 mg total) by mouth 2 (two) times daily. 07/11/18  Yes Paz, Alda Berthold, MD  Multiple Vitamins-Minerals (CENTRUM SILVER PO) Take 1 tablet by mouth daily.    Yes [provider]  omeprazole (PRILOSEC OTC) 20 MG tablet Take 1-2 tablets (20-40 mg total) by mouth daily. 12/26/18  Yes Paz, Alda Berthold, MD  ondansetron (ZOFRAN ODT) 4 MG disintegrating tablet Take 1 tablet (4 mg total) by mouth every 8 (eight) hours as needed for nausea or vomiting. 06/16/18  Yes Quintella Reichert, MD  vitamin B-12 (CYANOCOBALAMIN) 1000 MCG tablet Take 1,000 mcg by mouth daily.     Yes [provider]    Physical Exam: Vitals:   03/19/19 2015 03/19/19 2145 03/19/19 2200 03/19/19 2215  BP: 130/78 (!) 141/53 (!) 143/78 (!) 155/55  Pulse: 70 69 64 74    Resp: 15 (!) 24 17 12   Temp:      TempSrc:      SpO2: 100% 100% 99% 100%    Constitutional: NAD, calm, comfortable Eyes: PERRL, lids and conjunctivae normal ENMT: Mucous membranes are moist. Posterior pharynx clear of any exudate or lesions.Normal dentition.  Neck: normal, supple, no masses, no thyromegaly Respiratory: clear to auscultation bilaterally, no wheezing, no crackles. Normal respiratory effort. No accessory muscle use.  Cardiovascular: Regular rate and rhythm, no murmurs / rubs / gallops. No extremity edema. 2+ pedal pulses. No carotid bruits.  Abdomen: TTP diffusely, has guarding but no rebound Musculoskeletal: no clubbing / cyanosis. No joint deformity upper and lower extremities. Good ROM, no contractures. Normal muscle tone.  Skin: no rashes, lesions, ulcers. No induration Neurologic: CN 2-12 grossly intact. Sensation intact, DTR normal. Strength 5/5 in all 4.  Psychiatric: Normal judgment and insight. Alert and oriented x 3. Normal mood.    Labs on Admission: I have personally reviewed following labs and imaging studies  CBC: Recent Labs  Lab 03/19/19 2033  WBC 8.9  NEUTROABS 7.5  HGB 10.7*  HCT 32.3*  MCV 92.3  PLT 671   Basic Metabolic Panel: Recent Labs  Lab 03/19/19 2033  NA 136  K 3.3*  CL 96*  CO2 24  GLUCOSE 153*  BUN 41*  CREATININE 2.79*  CALCIUM 8.4*   GFR: CrCl cannot be calculated (Unknown ideal weight.). Liver Function Tests: Recent Labs  Lab 03/19/19 2033  AST 32  ALT 19  ALKPHOS 68  BILITOT 0.5  PROT 8.3*  ALBUMIN 3.8   Recent Labs  Lab 03/19/19 2033  LIPASE 69*   No results for input(s): AMMONIA in the last 168 hours. Coagulation Profile: No results for input(s): INR, PROTIME in the last 168 hours. Cardiac Enzymes: No results for input(s): CKTOTAL, CKMB, CKMBINDEX, TROPONINI in the last 168 hours. BNP (last 3 results) No results for input(s): PROBNP in the last 8760 hours. HbA1C: No results for input(s): HGBA1C  in the last 72 hours. CBG: No results for input(s): GLUCAP in the last 168 hours. Lipid Profile: No results for input(s): CHOL, HDL, LDLCALC, TRIG, CHOLHDL, LDLDIRECT in the last 72 hours. Thyroid Function Tests: No results for input(s): TSH, T4TOTAL, FREET4, T3FREE, THYROIDAB in the last 72 hours. Anemia Panel: No results for input(s): VITAMINB12, FOLATE, FERRITIN, TIBC, IRON, RETICCTPCT in the last 72 hours. Urine analysis:    Component Value Date/Time   COLORURINE STRAW (A) 03/19/2019 2329   APPEARANCEUR CLEAR 03/19/2019 2329   LABSPEC 1.005  03/19/2019 2329   PHURINE 6.0 03/19/2019 2329   GLUCOSEU 50 (A) 03/19/2019 2329   HGBUR NEGATIVE 03/19/2019 2329   BILIRUBINUR NEGATIVE 03/19/2019 2329   KETONESUR NEGATIVE 03/19/2019 2329   PROTEINUR NEGATIVE 03/19/2019 2329   UROBILINOGEN 0.2 12/29/2014 1400   NITRITE NEGATIVE 03/19/2019 2329   LEUKOCYTESUR NEGATIVE 03/19/2019 2329    Radiological Exams on Admission: Ct Abdomen Pelvis Wo Contrast  Result Date: 03/19/2019 CLINICAL DATA:  Abdominal pain with diverticulitis suspected. EXAM: CT ABDOMEN AND PELVIS WITHOUT CONTRAST TECHNIQUE: Multidetector CT imaging of the abdomen and pelvis was performed following the standard protocol without IV contrast. COMPARISON:  06/20/2018. FINDINGS: Lower chest: There is airspace consolidation at the right lung base. There is atelectasis at the left lung base. The heart size is enlarged. Aortic calcifications are noted. Hepatobiliary: No focal liver abnormality is seen. No gallstones, gallbladder wall thickening, or biliary dilatation. Pancreas: Unremarkable. No pancreatic ductal dilatation or surrounding inflammatory changes. Spleen: A few splenic calcifications are noted. Adrenals/Urinary Tract: There are bilateral stable renal cortical cysts, some of which are hyperdense especially on the left. There is no hydronephrosis. There are no radiopaque obstructing kidney stones. The bladder is significantly  distended. The adrenal glands are unremarkable. Stomach/Bowel: There is sigmoid diverticulosis without definite CT evidence of diverticulitis. There is no evidence of a small-bowel obstruction. The appendix is not reliably identified, however there are no inflammatory changes in the right lower quadrant. The stomach is unremarkable. Vascular/Lymphatic: Aortic atherosclerosis. No enlarged abdominal or pelvic lymph nodes. Reproductive: Uterus and bilateral adnexa are unremarkable. Other: No abdominal wall hernia or abnormality. No abdominopelvic ascites. Musculoskeletal: No acute or significant osseous findings. IMPRESSION: 1. Findings concerning for a right lower lobe pneumonia. 2. Sigmoid diverticulosis without CT evidence of diverticulitis. 3. Distended urinary bladder. 4.  Aortic Atherosclerosis (ICD10-I70.0). Electronically Signed   By: Constance Holster M.D.   On: 03/19/2019 22:55   Dg Chest Port 1 View  Result Date: 03/19/2019 CLINICAL DATA:  Lower abdominal pain and nausea. EXAM: PORTABLE CHEST 1 VIEW COMPARISON:  Chest x-ray dated 06/02/2011 FINDINGS: The heart size is enlarged. There is mild volume overload without overt pulmonary edema. There are small bilateral pleural effusions. Multiple surgical staples project over the left axilla. There is no displaced fracture. No large focal area of consolidation. There is elevation of the right hemidiaphragm. IMPRESSION: 1. Cardiomegaly with mild volume overload. 2. Trace bilateral pleural effusions. Electronically Signed   By: Constance Holster M.D.   On: 03/19/2019 23:52    EKG: Independently reviewed.  Assessment/Plan Principal Problem:   CAP (community acquired pneumonia) Active Problems:   Essential hypertension   Nausea vomiting and diarrhea   Acute respiratory failure with hypoxia ([place])    1. CAP - with new O2 requirement 1. PNA pathway 2. Rocephin / azithromycin 3. Cultures pending 4. RVP pending 5. COVID is negative surprisingly  enough 2. N/V/D + abd pain - 1. zofran PRN 2. Dilaudid PRN 3. Check lactic acid 4. CT abd/pelvis didn't show an acute intra-abdominal process 5. Hold lasix 6. IVF: NS at 75 cc/hr, got 500 cc bolus in ED 3. HTN - 1. Continue home BP meds  DVT prophylaxis: Lovenox Code Status: Full Family Communication: No family in room Disposition Plan: Home after admit Consults called: None Admission status: Admit to inpatient  Severity of Illness: The appropriate patient status for this patient is INPATIENT. Inpatient status is judged to be reasonable and necessary in order to provide the required intensity of service to  ensure the patient's safety. The patient's presenting symptoms, physical exam findings, and initial radiographic and laboratory data in the context of their chronic comorbidities is felt to place them at high risk for further clinical deterioration. Furthermore, it is not anticipated that the patient will be medically stable for discharge from the hospital within 2 midnights of admission. The following factors support the patient status of inpatient.   IP status for PNA with new O2 requirement.   * I certify that at the point of admission it is my clinical judgment that the patient will require inpatient hospital care spanning beyond 2 midnights from the point of admission due to high intensity of service, high risk for further deterioration and high frequency of surveillance required.*    Rolene Andrades M. DO [facility]  How to contact the [facility] Attending or Consulting provider [place] or covering provider during after hours [place], for this patient?  1. Check the care team in [facility] and look for a) attending/consulting [facility] provider listed and b) the [facility] team listed 2. Log into [email or web address]  Amion Physician Scheduling and messaging for groups and whole hospitals  On call and physician scheduling software for group practices, residents, hospitalists and other medical  providers for call, clinic, rotation and shift schedules. On[employer] is a hospital-wide system for scheduling doctors and paging doctors on call. EasyPlot is for scientific plotting and data analysis.  [email or web address]  and use [place]'s universal password to access. If you do not have the password, please contact the hospital operator.  3. Locate the [facility] provider you are looking for under [facility] and page to a number that you can be directly reached. 4. If you still have difficulty reaching the provider, please page the [facility] (Director on Call) for the Hospitalists listed on amion for assistance.  03/20/2019, 1:10 AM

## 2019-03-20 NOTE — ED Notes (Signed)
Updated pt's son that pt has bed on 2W

## 2019-03-20 NOTE — ED Notes (Signed)
Breakfast tray ordered 

## 2019-03-20 NOTE — Progress Notes (Addendum)
Progress Note    Samantha Mckenzie  CHE:[id number] DOB: July 10, 1936  DOA: 03/19/2019 PCP: Colon Branch, MD    Brief Narrative:   Chief complaint: Follow-up nausea, vomiting, diarrhea and lobar pneumonia with hypoxic respiratory failure.  Medical records reviewed and are as summarized below:  Samantha Mckenzie is an 83 y.o. female with a PMH of stage IV CKD and hypertension who presented to the hospital 03/19/2019 with a chief complaint of abdominal pain associated with nausea, vomiting, and diarrhea.  CT of the abdomen and pelvis showed a right lower lobe pneumonia but no acute intra-abdominal process.  She was also noted to be hypoxic with oxygen saturations in the 70s, which improved with 3 L of oxygen via nasal cannula.  COVID testing was negative.  Assessment/Plan:   Principal Problem:   Sepsis secondary to lobar CAP (community acquired pneumonia) associated with acute hypoxic respiratory failure Chest x-ray personally reviewed and shows cardiomegaly with trace bilateral pleural effusions.  No obvious pneumonia.  CT (also personally reviewed), however does show a right lower lobar pneumonia. Sepsis physiology present with leukocytosis, tachycardia, tachypnea, elevated lactic acid and acute kidney injury.  Empirically placed on Rocephin/azithromycin.  WBC markedly elevated at 18.  Lactic acid elevated at 2.2.  COVID testing negative.  Follow-up blood cultures, respiratory virus panel, strep pneumonia antigen..  Given the presence of a lobar pneumonia in the right lower lobe and advanced age, would get a swallowing evaluation.  Patient remains toxic appearing and weak.  Continue current therapy.  Still on oxygen but sats improved.  Wean oxygen for sats > 92%.     Active Problems:   Essential hypertension Blood pressure stable.  Continue amlodipine and metoprolol.    Nausea vomiting and diarrhea No acute findings on CT.  Continue supportive care with antiemetics.   Hypokalemia/hypomagnesemia Received potassium runs in the ED last night.  Will give 4 more runs.  Magnesium checked and is also low, will give 4 grams IV magnesium sulfate and recheck lytes in a.m.    Acute kidney injury on stage IV chronic kidney disease Baseline creatinine appears to be around 1.7-2.4.  Current creatinine elevated over usual baseline values, but improving on IV fluids.    Normocytic anemia Likely secondary to chronic kidney disease.    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  Will place on insulin sensitive SSI with 3 units of meal coverage and change diet to carbohydrate modified.    Gout   Continue allopurinol.    Diabetic peripheral neuropathy Continue Neurontin.    Hyperlipidemia Continue Lipitor.    GERD Continue PPI therapy.  Family Communication/Anticipated D/C date and plan/Code Status   DVT prophylaxis: Lovenox ordered. Code Status: Full Code.  Family Communication: Spouse called and updated by telephone. Disposition Plan: Home when clinically improved. Will need PT when feeling better.   Medical Consultants:    None.   Anti-Infectives:    Rocephin 03/19/2019--->  Azithromycin 03/19/2019--->  Subjective:   Samantha Mckenzie feels weak and continues to complain of lower abdominal pain and mild nausea.  No vomiting over night. Unable to tolerate oral intake.  Objective:    Vitals:   03/20/19 0600 03/20/19 0629 03/20/19 0700 03/20/19 0750  BP: (!) 155/61   (!) 161/62  Pulse: (!) 106   (!) 112  Resp: 14   14  Temp:  97.8 F (36.6 C)  TempSrc:  Oral    SpO2: 100%   100%  Weight:   62.2 kg   Height:   5' 2"  (1.575 m)     Intake/Output Summary (Last 24 hours) at 03/20/2019 0755 Last data filed at 03/20/2019 0359 Gross per 24 hour  Intake 682.42 ml  Output 550 ml  Net 132.42 ml   Filed Weights   03/20/19  0700  Weight: 62.2 kg    Exam: General: Ill appearing female. Cardiovascular: Heart sounds show a regular rate, and rhythm. No gallops or rubs. No murmurs. No JVD. Lungs: Clear to auscultation bilaterally with good air movement. No rales, rhonchi or wheezes. Abdomen: Soft, tender in the lower abdomen with +normal active bowel sounds. No masses. No hepatosplenomegaly. Neurological: Mildly lethargic but oriented 3. Moves all extremities 4 with equal strength but with generalized weakness. Cranial nerves II through XII grossly intact. Skin: Warm and dry. No rashes or lesions. Extremities: No clubbing or cyanosis. 1+ edema. Pedal pulses 2+. Psychiatric: Mood and affect are flat. Insight and judgment are fair.   Data Reviewed:   I have personally reviewed following labs and imaging studies:  Labs: Labs show the following:   Basic Metabolic Panel: Recent Labs  Lab 03/19/19 2033 03/20/19 0526  NA 136 139  K 3.3* 3.2*  CL 96* 100  CO2 24 23  GLUCOSE 153* 243*  BUN 41* 36*  CREATININE 2.79* 2.45*  CALCIUM 8.4* 8.3*   GFR Estimated Creatinine Clearance: 15.9 mL/min (A) (by C-G formula based on SCr of 2.45 mg/dL (H)). Liver Function Tests: Recent Labs  Lab 03/19/19 2033  AST 32  ALT 19  ALKPHOS 68  BILITOT 0.5  PROT 8.3*  ALBUMIN 3.8   Recent Labs  Lab 03/19/19 2033  LIPASE 69*   CBC: Recent Labs  Lab 03/19/19 2033 03/20/19 0526  WBC 8.9 18.0*  NEUTROABS 7.5  --   HGB 10.7* 11.0*  HCT 32.3* 33.6*  MCV 92.3 94.9  PLT 214 187   Sepsis Labs: Recent Labs  Lab 03/19/19 2033 03/20/19 0248 03/20/19 0526  WBC 8.9  --  18.0*  LATICACIDVEN  --  2.2* 2.0*    Microbiology Recent Results (from the past 240 hour(s))  SARS Coronavirus 2 (CEPHEID - Performed in [place] hospital lab), Hosp Order     Status: None   Collection Time: 03/19/19 11:38 PM  Result Value Ref Range Status   SARS Coronavirus 2 NEGATIVE NEGATIVE Final    Comment: (NOTE) If result is  NEGATIVE SARS-CoV-2 target nucleic acids are NOT DETECTED. The SARS-CoV-2 RNA is generally detectable in upper and lower  respiratory specimens during the acute phase of infection. The lowest  concentration of SARS-CoV-2 viral copies this assay can detect is 250  copies / mL. A negative result does not preclude SARS-CoV-2 infection  and should not be used as the sole basis for treatment or other  patient management decisions.  A negative result may occur with  improper specimen collection / handling, submission of specimen other  than nasopharyngeal swab, presence of viral mutation(s) within the  areas targeted by this assay, and inadequate number of viral copies  (<250 copies / mL). A negative result must be combined with clinical  observations, patient history, and epidemiological information. If result is POSITIVE SARS-CoV-2 target nucleic acids are DETECTED. The SARS-CoV-2 RNA is generally detectable in upper and lower  respiratory specimens dur ing the acute phase of infection.  Positive  results are indicative of active infection with  SARS-CoV-2.  Clinical  correlation with patient history and other diagnostic information is  necessary to determine patient infection status.  Positive results do  not rule out bacterial infection or co-infection with other viruses. If result is PRESUMPTIVE POSTIVE SARS-CoV-2 nucleic acids MAY BE PRESENT.   A presumptive positive result was obtained on the submitted specimen  and confirmed on repeat testing.  While 2019 novel coronavirus  (SARS-CoV-2) nucleic acids may be present in the submitted sample  additional confirmatory testing may be necessary for epidemiological  and / or clinical management purposes  to differentiate between  SARS-CoV-2 and other Sarbecovirus currently known to infect humans.  If clinically indicated additional testing with an alternate test  methodology [phone]) is advised. The SARS-CoV-2 RNA is generally  detectable  in upper and lower respiratory sp ecimens during the acute  phase of infection. The expected result is Negative. Fact Sheet for Patients:  StrictlyIdeas.no Fact Sheet for Healthcare Providers: BankingDealers.co.za This test is not yet approved or cleared by the [place] FDA and has been authorized for detection and/or diagnosis of SARS-CoV-2 by FDA under an Emergency Use Authorization (EUA).  This EUA will remain in effect (meaning this test can be used) for the duration of the COVID-19 declaration under Section 564(b)(1) of the Act, 21 U.S.C. section 360bbb-3(b)(1), unless the authorization is terminated or revoked sooner. Performed at [facility], [place] [address], [place], [place] [address]     Procedures and diagnostic studies:  Ct Abdomen Pelvis Wo Contrast  Result Date: 03/19/2019 CLINICAL DATA:  Abdominal pain with diverticulitis suspected. EXAM: CT ABDOMEN AND PELVIS WITHOUT CONTRAST TECHNIQUE: Multidetector CT imaging of the abdomen and pelvis was performed following the standard protocol without IV contrast. COMPARISON:  06/20/2018. FINDINGS: Lower chest: There is airspace consolidation at the right lung base. There is atelectasis at the left lung base. The heart size is enlarged. Aortic calcifications are noted. Hepatobiliary: No focal liver abnormality is seen. No gallstones, gallbladder wall thickening, or biliary dilatation. Pancreas: Unremarkable. No pancreatic ductal dilatation or surrounding inflammatory changes. Spleen: A few splenic calcifications are noted. Adrenals/Urinary Tract: There are bilateral stable renal cortical cysts, some of which are hyperdense especially on the left. There is no hydronephrosis. There are no radiopaque obstructing kidney stones. The bladder is significantly distended. The adrenal glands are unremarkable. Stomach/Bowel: There is sigmoid diverticulosis without definite CT evidence of  diverticulitis. There is no evidence of a small-bowel obstruction. The appendix is not reliably identified, however there are no inflammatory changes in the right lower quadrant. The stomach is unremarkable. Vascular/Lymphatic: Aortic atherosclerosis. No enlarged abdominal or pelvic lymph nodes. Reproductive: Uterus and bilateral adnexa are unremarkable. Other: No abdominal wall hernia or abnormality. No abdominopelvic ascites. Musculoskeletal: No acute or significant osseous findings. IMPRESSION: 1. Findings concerning for a right lower lobe pneumonia. 2. Sigmoid diverticulosis without CT evidence of diverticulitis. 3. Distended urinary bladder. 4.  Aortic Atherosclerosis (ICD10-I70.0). Electronically Signed   By: Constance Holster M.D.   On: 03/19/2019 22:55   Dg Chest Port 1 View  Result Date: 03/19/2019 CLINICAL DATA:  Lower abdominal pain and nausea. EXAM: PORTABLE CHEST 1 VIEW COMPARISON:  Chest x-ray dated 06/02/2011 FINDINGS: The heart size is enlarged. There is mild volume overload without overt pulmonary edema. There are small bilateral pleural effusions. Multiple surgical staples project over the left axilla. There is no displaced fracture. No large focal area of consolidation. There is elevation of the right hemidiaphragm. IMPRESSION: 1. Cardiomegaly with mild volume overload.  2. Trace bilateral pleural effusions. Electronically Signed   By: Constance Holster M.D.   On: 03/19/2019 23:52    Medications:   . allopurinol  100 mg Oral Daily  . amLODipine  10 mg Oral Daily  . aspirin  81 mg Oral Daily  . atorvastatin  20 mg Oral Daily  . [START ON 03/21/2019] azithromycin  500 mg Oral Q24H  . enoxaparin (LOVENOX) injection  30 mg Subcutaneous Q24H  . fentaNYL (SUBLIMAZE) injection  50 mcg Intravenous Once  . gabapentin  200 mg Oral QHS  . metoprolol tartrate  25 mg Oral BID   Continuous Infusions: . sodium chloride 75 mL/hr at 03/20/19 0259  . [START ON 03/21/2019] cefTRIAXone (ROCEPHIN)   IV       LOS: 0 days   Samantha Mckenzie  [facility] Pager [phone].   *Please refer to [email or web address], password [email or web address] to get updated schedule on who will round on this patient, as hospitalists switch teams weekly. If 7PM-7AM, please contact night-coverage at [email or web address], password [email or web address] for any overnight needs.  03/20/2019, 7:55 AM

## 2019-03-20 NOTE — ED Notes (Signed)
Pt is a one assist up to bedside commode.  Pt placed in an inpt bed for comfort.

## 2019-03-20 NOTE — Evaluation (Signed)
Clinical/Bedside Swallow Evaluation Patient Details  Name: Samantha Mckenzie MRN: [id number] Date of Birth: January 28, 1936  Today's Date: 03/20/2019 Time: SLP Start Time (ACUTE ONLY): 101 SLP Stop Time (ACUTE ONLY): 1350 SLP Time Calculation (min) (ACUTE ONLY): 16 min  Past Medical History:  Past Medical History:  Diagnosis Date  . Allergic rhinitis   . Anemia   . Breast CA ([place])    surgery, chemo, XRT; had peripheral neuropathy (imbalance at times) after chemo  . Cellulitis    Left arm, recurrent   . Cellulitis LEFT arm recurrent 12/08/2011  . Chronic renal disease, stage IV ([facility])    Dr. Moshe Cipro  . Depression   . Fatigue   . GERD (gastroesophageal reflux disease)    gastritis, EGD 02/2007  . Gout   . Hyperlipidemia   . Hypertension   . Macular degeneration 2011  . Normal cardiac stress test 12/2014  . Osteoarthritis   . Renal insufficiency    Past Surgical History:  Past Surgical History:  Procedure Laterality Date  . cataracts bilaterally  1 -2012  . LUNG BIOPSY  1999   neg  . MASTECTOMY Left 1999   and lymphnodes    HPI:  Samantha Mckenzie is an 83 y.o. female with a PMH of stage IV CKD, GERD, breast cancer and hypertension who presented to the hospital 03/19/2019 with a chief complaint of abdominal pain associated with nausea, vomiting, and diarrhea. CT of the abdomen and pelvis showed a right lower lobe pneumonia but no acute intra-abdominal process.  She was also noted to be hypoxic with oxygen saturations in the 70s. Covid testing was negative.   Assessment / Plan / Recommendation Clinical Impression  Oral and pharyngeal components of swallow were functional from clinical observation and discussion re: swallow. Pt has dx of GERD and reports frequent heart burn for which she takes medication. She takes precautions such as sleeping inclined, remains up right following meals. SLP educated on additional reflux precautions. It is possible she may have aspirated post prandial.  Recommend continue regular texture, thin liquids and reflux precautions. No follow up needed.        SLP Visit Diagnosis: Dysphagia, unspecified (R13.10)    Aspiration Risk  Mild aspiration risk    Diet Recommendation Regular;Thin liquid   Liquid Administration via: Cup;Straw Medication Administration: Whole meds with liquid Supervision: Patient able to self feed Postural Changes: Remain upright for at least 30 minutes after po intake;Seated upright at 90 degrees    Other  Recommendations Oral Care Recommendations: Oral care BID   Follow up Recommendations None      Frequency and Duration            Prognosis        Swallow Study   General HPI: Samantha Mckenzie is an 83 y.o. female with a PMH of stage IV CKD, GERD, breast cancer and hypertension who presented to the hospital 03/19/2019 with a chief complaint of abdominal pain associated with nausea, vomiting, and diarrhea. CT of the abdomen and pelvis showed a right lower lobe pneumonia but no acute intra-abdominal process.  She was also noted to be hypoxic with oxygen saturations in the 70s. Covid testing was negative. Type of Study: Bedside Swallow Evaluation Previous Swallow Assessment: (none) Diet Prior to this Study: Regular;Thin liquids Temperature Spikes Noted: No Respiratory Status: Nasal cannula History of Recent Intubation: No Behavior/Cognition: Alert;Cooperative;Pleasant mood Oral Cavity Assessment: Other (comment)(? lingual candidia) Oral Care Completed by SLP: No Oral Cavity - Dentition: Adequate  natural dentition Vision: Functional for self-feeding Self-Feeding Abilities: Able to feed self Patient Positioning: Upright in bed Baseline Vocal Quality: Normal Volitional Cough: Strong Volitional Swallow: Able to elicit    Oral/Motor/Sensory Function Overall Oral Motor/Sensory Function: Within functional limits   Ice Chips Ice chips: Not tested   Thin Liquid Thin Liquid: Within functional limits Presentation:  Cup;Straw    Nectar Thick Nectar Thick Liquid: Not tested   Honey Thick Honey Thick Liquid: Not tested   Puree Puree: Not tested   Solid     Solid: Within functional limits      Houston Siren 03/20/2019,4:43 PM   Orbie Pyo Colvin Caroli.Ed Risk analyst [phone] Office [phone]

## 2019-03-20 NOTE — ED Notes (Addendum)
Pt's husband updated on patients status. Pt's husband expressed understanding and stated that he would call back in the morning to see what room she is in and get an update. Please call if needed or if patients status changes. George(husband) [phone]

## 2019-03-21 ENCOUNTER — Other Ambulatory Visit: Payer: Self-pay

## 2019-03-21 DIAGNOSIS — R4702 Dysphasia: Secondary | ICD-10-CM | POA: Diagnosis present

## 2019-03-21 LAB — CBC
HCT: 32.4 % — ABNORMAL LOW (ref 36.0–46.0)
Hemoglobin: 10.8 g/dL — ABNORMAL LOW (ref 12.0–15.0)
MCH: 30.8 pg (ref 26.0–34.0)
MCHC: 33.3 g/dL (ref 30.0–36.0)
MCV: 92.3 fL (ref 80.0–100.0)
Platelets: 183 10*3/uL (ref 150–400)
RBC: 3.51 MIL/uL — ABNORMAL LOW (ref 3.87–5.11)
RDW: 14.9 % (ref 11.5–15.5)
WBC: 18.5 10*3/uL — ABNORMAL HIGH (ref 4.0–10.5)
nRBC: 0 % (ref 0.0–0.2)

## 2019-03-21 LAB — BASIC METABOLIC PANEL
Anion gap: 14 (ref 5–15)
BUN: 31 mg/dL — ABNORMAL HIGH (ref 8–23)
CO2: 25 mmol/L (ref 22–32)
Calcium: 8.3 mg/dL — ABNORMAL LOW (ref 8.9–10.3)
Chloride: 102 mmol/L (ref 98–111)
Creatinine, Ser: 1.88 mg/dL — ABNORMAL HIGH (ref 0.44–1.00)
GFR calc Af Amer: 29 mL/min — ABNORMAL LOW (ref >60)
GFR calc non Af Amer: 25 mL/min — ABNORMAL LOW (ref >60)
Glucose, Bld: 140 mg/dL — ABNORMAL HIGH (ref 70–99)
Potassium: 3.7 mmol/L (ref 3.5–5.1)
Sodium: 141 mmol/L (ref 135–145)

## 2019-03-21 LAB — GLUCOSE, CAPILLARY
Glucose-Capillary: 118 mg/dL — ABNORMAL HIGH (ref 70–99)
Glucose-Capillary: 152 mg/dL — ABNORMAL HIGH (ref 70–99)
Glucose-Capillary: 106 mg/dL — ABNORMAL HIGH (ref 70–99)
Glucose-Capillary: 133 mg/dL — ABNORMAL HIGH (ref 70–99)

## 2019-03-21 LAB — MAGNESIUM: Magnesium: 2.4 mg/dL (ref 1.7–2.4)

## 2019-03-21 MED ORDER — ALUM & MAG HYDROXIDE-SIMETH 200-200-20 MG/5ML PO SUSP
15.0000 mL | ORAL | Status: DC | PRN
  Administered 2019-03-21 – 2019-03-24 (×3): 15 mL via ORAL
  Filled 2019-03-21 (×4): qty 30

## 2019-03-21 NOTE — Progress Notes (Addendum)
Progress Note    Samantha Mckenzie  UKG:[id number] DOB: Feb 08, 1936  DOA: 03/19/2019 PCP: Colon Branch, MD    Brief Narrative:   Chief complaint: Follow-up nausea, vomiting, diarrhea and lobar pneumonia with hypoxic respiratory failure.  Medical records reviewed and are as summarized below:  Samantha Mckenzie is an 83 y.o. female with a PMH of stage IV CKD and hypertension who presented to the hospital 03/19/2019 with a chief complaint of abdominal pain associated with nausea, vomiting, and diarrhea.  CT of the abdomen and pelvis showed a right lower lobe pneumonia but no acute intra-abdominal process.  She was also noted to be hypoxic with oxygen saturations in the 70s, which improved with 3 L of oxygen via nasal cannula.  COVID testing was negative.  Assessment/Plan:   Principal Problem:   Sepsis secondary to lobar CAP (community acquired pneumonia) associated with acute hypoxic respiratory failure/dysphasia Chest x-ray showed cardiomegaly with trace bilateral pleural effusions.  No obvious pneumonia.  CT showed a right lower lobar pneumonia. Sepsis physiology present on admission with leukocytosis, tachycardia, tachypnea, elevated lactic acid and acute kidney injury.  Empirically placed on Rocephin/azithromycin. COVID testing, strep pneumoniae and respiratory virus panel negative.  WBC 18.5 this am. Follow-up blood cultures.  Given the presence of a lobar pneumonia in the right lower lobe and advanced age, the patient was evaluated by ST for a swallowing evaluation and found to be at mild risk for aspiration.  Remains tachycardic today and is still reporting abdominal pain associated with poor oral intake. Still on oxygen, will attempt to wean.     Active Problems:   Essential hypertension Blood pressure stable.  Continue amlodipine and metoprolol.    Nausea vomiting and diarrhea No acute findings on CT.  Continue supportive care with antiemetics. No vomiting or diarrhea but still  reports nausea and abdominal pain.      Hypokalemia/hypomagnesemia Repleted.    Acute kidney injury on stage IV chronic kidney disease Baseline creatinine appears to be around 1.7-2.4.  Creatinine elevated over usual baseline values on admission, now back to baseline after IV hydration.    Normocytic anemia Likely secondary to chronic kidney disease.  Hemoglobin stable.    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  Currently being managed with insulin sensitive SSI with 3 units of meal coverage and a carbohydrate modified.  CBG is 118-145, good control.    Gout   Continue allopurinol.    Diabetic peripheral neuropathy Continue Neurontin.    Hyperlipidemia Continue Lipitor.    GERD Continue PPI therapy.  Family Communication/Anticipated D/C date and plan/Code Status   DVT prophylaxis: Lovenox ordered. Code Status: Full Code.  Family Communication: Spouse called and updated by telephone. Disposition Plan: Home when clinically improved. Will need PT when feeling better.   Medical Consultants:    None.   Anti-Infectives:    Rocephin 03/19/2019--->  Azithromycin 03/19/2019--->  Subjective:   Samantha Mckenzie continues to feel weak and continues to report lower abdominal pain and nausea, but no further diarrhea or vomiting.  Says she has not eaten anything.  Remains on oxygen.  Objective:    Vitals:   03/20/19 0843 03/20/19 1632 03/21/19 0010 03/21/19 0013  BP: (!) 166/63 (!) 166/60 (!) 157/61   Pulse: (!) 120 (!) 101 80   Resp: 18 17 16  (!) 95  Temp: 98.2 F (36.8 C) 98.7 F (37.1  C) 98.4 F (36.9 C)   TempSrc: Oral  Oral   SpO2: 100% 100% 100%   Weight:      Height:        Intake/Output Summary (Last 24 hours) at 03/21/2019 0804 Last data filed at 03/21/2019 0549 Gross per 24 hour  Intake 519.45 ml  Output 2200 ml  Net -1680.55  ml   Filed Weights   03/20/19 0700  Weight: 62.2 kg    Exam: General: No acute distress.  Continues to be ill-appearing. Cardiovascular: Heart sounds are tachycardic. No gallops or rubs. No murmurs. No JVD. Lungs: Diminished in the bases. No rales, rhonchi or wheezes. Abdomen: Continues to be very tender to the lower abdomen.  Positive bowel sounds. No masses. No hepatosplenomegaly. Skin: Warm and dry. No rashes or lesions. Extremities: No clubbing or cyanosis. No edema. Pedal pulses 2+.  Data Reviewed:   I have personally reviewed following labs and imaging studies:  Labs: Labs show the following:   Basic Metabolic Panel: Recent Labs  Lab 03/19/19 2033 03/20/19 0526 03/21/19 0533  NA 136 139 141  K 3.3* 3.2* 3.7  CL 96* 100 102  CO2 24 23 25   GLUCOSE 153* 243* 140*  BUN 41* 36* 31*  CREATININE 2.79* 2.45* 1.88*  CALCIUM 8.4* 8.3* 8.3*  MG  --  1.2* 2.4   GFR Estimated Creatinine Clearance: 20.7 mL/min (A) (by C-G formula based on SCr of 1.88 mg/dL (H)). Liver Function Tests: Recent Labs  Lab 03/19/19 2033  AST 32  ALT 19  ALKPHOS 68  BILITOT 0.5  PROT 8.3*  ALBUMIN 3.8   Recent Labs  Lab 03/19/19 2033  LIPASE 69*   CBC: Recent Labs  Lab 03/19/19 2033 03/20/19 0526 03/21/19 0533  WBC 8.9 18.0* 18.5*  NEUTROABS 7.5  --   --   HGB 10.7* 11.0* 10.8*  HCT 32.3* 33.6* 32.4*  MCV 92.3 94.9 92.3  PLT 214 187 183   Sepsis Labs: Recent Labs  Lab 03/19/19 2033 03/20/19 0248 03/20/19 0526 03/21/19 0533  WBC 8.9  --  18.0* 18.5*  LATICACIDVEN  --  2.2* 2.0*  --    CBG (last 3)  Recent Labs    03/20/19 1629 03/20/19 2144 03/21/19 0725  GLUCAP 145* 123* 118*     Microbiology Recent Results (from the past 240 hour(s))  SARS Coronavirus 2 (CEPHEID - Performed in [place] hospital lab), Hosp Order     Status: None   Collection Time: 03/19/19 11:38 PM  Result Value Ref Range Status   SARS Coronavirus 2 NEGATIVE NEGATIVE Final    Comment:  (NOTE) If result is NEGATIVE SARS-CoV-2 target nucleic acids are NOT DETECTED. The SARS-CoV-2 RNA is generally detectable in upper and lower  respiratory specimens during the acute phase of infection. The lowest  concentration of SARS-CoV-2 viral copies this assay can detect is 250  copies / mL. A negative result does not preclude SARS-CoV-2 infection  and should not be used as the sole basis for treatment or other  patient management decisions.  A negative result may occur with  improper specimen collection / handling, submission of specimen other  than nasopharyngeal swab, presence of viral mutation(s) within the  areas targeted by this assay, and inadequate number of viral copies  (<250 copies / mL). A negative result must be combined with clinical  observations, patient history, and epidemiological information. If result is POSITIVE SARS-CoV-2 target nucleic acids are DETECTED. The SARS-CoV-2 RNA is generally detectable in upper  and lower  respiratory specimens dur ing the acute phase of infection.  Positive  results are indicative of active infection with SARS-CoV-2.  Clinical  correlation with patient history and other diagnostic information is  necessary to determine patient infection status.  Positive results do  not rule out bacterial infection or co-infection with other viruses. If result is PRESUMPTIVE POSTIVE SARS-CoV-2 nucleic acids MAY BE PRESENT.   A presumptive positive result was obtained on the submitted specimen  and confirmed on repeat testing.  While 2019 novel coronavirus  (SARS-CoV-2) nucleic acids may be present in the submitted sample  additional confirmatory testing may be necessary for epidemiological  and / or clinical management purposes  to differentiate between  SARS-CoV-2 and other Sarbecovirus currently known to infect humans.  If clinically indicated additional testing with an alternate test  methodology [phone]) is advised. The SARS-CoV-2 RNA is  generally  detectable in upper and lower respiratory sp ecimens during the acute  phase of infection. The expected result is Negative. Fact Sheet for Patients:  StrictlyIdeas.no Fact Sheet for Healthcare Providers: BankingDealers.co.za This test is not yet approved or cleared by the [place] FDA and has been authorized for detection and/or diagnosis of SARS-CoV-2 by FDA under an Emergency Use Authorization (EUA).  This EUA will remain in effect (meaning this test can be used) for the duration of the COVID-19 declaration under Section 564(b)(1) of the Act, 21 U.S.C. section 360bbb-3(b)(1), unless the authorization is terminated or revoked sooner. Performed at [facility], [place] [address] [place], [place] [address]   Respiratory Panel by PCR     Status: None   Collection Time: 03/20/19 12:57 AM  Result Value Ref Range Status   Adenovirus NOT DETECTED NOT DETECTED Final   Coronavirus 229E NOT DETECTED NOT DETECTED Final    Comment: (NOTE) The Coronavirus on the Respiratory Panel, DOES NOT test for the novel  Coronavirus (2019 nCoV)    Coronavirus HKU1 NOT DETECTED NOT DETECTED Final   Coronavirus NL63 NOT DETECTED NOT DETECTED Final   Coronavirus OC43 NOT DETECTED NOT DETECTED Final   Metapneumovirus NOT DETECTED NOT DETECTED Final   Rhinovirus / Enterovirus NOT DETECTED NOT DETECTED Final   Influenza A NOT DETECTED NOT DETECTED Final   Influenza B NOT DETECTED NOT DETECTED Final   Parainfluenza Virus 1 NOT DETECTED NOT DETECTED Final   Parainfluenza Virus 2 NOT DETECTED NOT DETECTED Final   Parainfluenza Virus 3 NOT DETECTED NOT DETECTED Final   Parainfluenza Virus 4 NOT DETECTED NOT DETECTED Final   Respiratory Syncytial Virus NOT DETECTED NOT DETECTED Final   Bordetella pertussis NOT DETECTED NOT DETECTED Final   Chlamydophila pneumoniae NOT DETECTED NOT DETECTED Final   Mycoplasma pneumoniae NOT DETECTED NOT DETECTED  Final    Comment: Performed at [facility], [place]. [address], [place], [place] [address]    Procedures and diagnostic studies:  Ct Abdomen Pelvis Wo Contrast  Result Date: 03/19/2019 CLINICAL DATA:  Abdominal pain with diverticulitis suspected. EXAM: CT ABDOMEN AND PELVIS WITHOUT CONTRAST TECHNIQUE: Multidetector CT imaging of the abdomen and pelvis was performed following the standard protocol without IV contrast. COMPARISON:  06/20/2018. FINDINGS: Lower chest: There is airspace consolidation at the right lung base. There is atelectasis at the left lung base. The heart size is enlarged. Aortic calcifications are noted. Hepatobiliary: No focal liver abnormality is seen. No gallstones, gallbladder wall thickening, or biliary dilatation. Pancreas: Unremarkable. No pancreatic ductal dilatation or surrounding inflammatory changes. Spleen: A few  splenic calcifications are noted. Adrenals/Urinary Tract: There are bilateral stable renal cortical cysts, some of which are hyperdense especially on the left. There is no hydronephrosis. There are no radiopaque obstructing kidney stones. The bladder is significantly distended. The adrenal glands are unremarkable. Stomach/Bowel: There is sigmoid diverticulosis without definite CT evidence of diverticulitis. There is no evidence of a small-bowel obstruction. The appendix is not reliably identified, however there are no inflammatory changes in the right lower quadrant. The stomach is unremarkable. Vascular/Lymphatic: Aortic atherosclerosis. No enlarged abdominal or pelvic lymph nodes. Reproductive: Uterus and bilateral adnexa are unremarkable. Other: No abdominal wall hernia or abnormality. No abdominopelvic ascites. Musculoskeletal: No acute or significant osseous findings. IMPRESSION: 1. Findings concerning for a right lower lobe pneumonia. 2. Sigmoid diverticulosis without CT evidence of diverticulitis. 3. Distended urinary bladder. 4.  Aortic Atherosclerosis  (ICD10-I70.0). Electronically Signed   By: Constance Holster M.D.   On: 03/19/2019 22:55   Dg Chest Port 1 View  Result Date: 03/19/2019 CLINICAL DATA:  Lower abdominal pain and nausea. EXAM: PORTABLE CHEST 1 VIEW COMPARISON:  Chest x-ray dated 06/02/2011 FINDINGS: The heart size is enlarged. There is mild volume overload without overt pulmonary edema. There are small bilateral pleural effusions. Multiple surgical staples project over the left axilla. There is no displaced fracture. No large focal area of consolidation. There is elevation of the right hemidiaphragm. IMPRESSION: 1. Cardiomegaly with mild volume overload. 2. Trace bilateral pleural effusions. Electronically Signed   By: Constance Holster M.D.   On: 03/19/2019 23:52    Medications:    allopurinol  100 mg Oral Daily   amLODipine  10 mg Oral Daily   aspirin  81 mg Oral Daily   atorvastatin  20 mg Oral Daily   azithromycin  500 mg Oral Q24H   enoxaparin (LOVENOX) injection  30 mg Subcutaneous Q24H   fentaNYL (SUBLIMAZE) injection  50 mcg Intravenous Once   gabapentin  200 mg Oral QHS   insulin aspart  0-5 Units Subcutaneous QHS   insulin aspart  0-9 Units Subcutaneous TID WC   insulin aspart  3 Units Subcutaneous TID WC   metoprolol tartrate  25 mg Oral BID   Continuous Infusions:  sodium chloride 75 mL/hr at 03/20/19 1903   cefTRIAXone (ROCEPHIN)  IV 1 g (03/21/19 0351)     LOS: 1 day   Jacquelynn Cree  [facility] Pager [phone].   *Please refer to [email or web address], password [email or web address] to get updated schedule on who will round on this patient, as hospitalists switch teams weekly. If 7PM-7AM, please contact night-coverage at [email or web address], password [email or web address] for any overnight needs.  03/21/2019, 8:04 AM

## 2019-03-21 NOTE — Plan of Care (Signed)
  Problem: Education: Goal: Knowledge of General Education information will improve Description Including pain rating scale, medication(s)/side effects and non-pharmacologic comfort measures Outcome: Progressing   Problem: Health Behavior/Discharge Planning: Goal: Ability to manage health-related needs will improve Outcome: Progressing   Problem: Activity: Goal: Risk for activity intolerance will decrease Outcome: Progressing   Problem: Nutrition: Goal: Adequate nutrition will be maintained Outcome: Not Progressing   Problem: Coping: Goal: Level of anxiety will decrease Outcome: Progressing   Problem: Pain Managment: Goal: General experience of comfort will improve Outcome: Not Progressing

## 2019-03-21 NOTE — Evaluation (Signed)
Physical Therapy Evaluation Patient Details Name: Samantha Mckenzie MRN: [id number] DOB: [birth year] Today's Date: 03/21/2019   History of Present Illness  Samantha Mckenzie is an 83 y.o. female with a PMH of stage IV CKD and hypertension who presented to the hospital 03/19/2019 with a chief complaint of abdominal pain associated with nausea, vomiting, and diarrhea.  CT of the abdomen and pelvis showed a right lower lobe pneumonia but no acute intra-abdominal process.  She was also noted to be hypoxic with oxygen saturations in the 70s, which improved with 3 L of oxygen via nasal cannula.  COVID testing was negative.  Clinical Impression  Pt admitted with above diagnosis. Pt currently with functional limitations due to the deficits listed below (see PT Problem List). Pt was able to ambulate around bed however somewhat unsteady and will need RW for home use for safety.  Pt also positive for right BPPV and treated with canalith repositioning. Will follow acutely.  Pt will benefit from skilled PT to increase their independence and safety with mobility to allow discharge to the venue listed below.      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   Ambulation/Gait Ambulation/Gait assistance: Min assist Gait Distance (Feet): 12 Feet Assistive device: 1 person hand held assist Gait Pattern/deviations: Step-through pattern;Decreased stride length   Gait velocity interpretation: 1.31 - 2.62 ft/sec, indicative of limited community ambulator General Gait Details: Pt was able to ambulate around bed with guard assist. Slightly unsteady on feet however unsure if this was due to canalith repositioning manuever.    Stairs            Wheelchair Mobility    Modified Rankin (Stroke Patients Only)       Balance Overall balance assessment: Needs assistance Sitting-balance support: No upper extremity supported;Feet supported Sitting balance-Leahy Scale: Fair     Standing balance support: Single extremity supported;During functional activity Standing balance-Leahy Scale: Poor Standing balance comment: relies on at least 1 UE support                             Pertinent Vitals/Pain Pain Assessment: No/denies pain    Home Living Family/patient expects to be discharged to:: Private residence Living Arrangements: Spouse/significant other Available Help at Discharge: (Pt cooks and drives for spouse, he uses walker) Type of Home: House Home Access: Stairs to enter   Technical brewer of Steps: 2-3 Home Layout: One level Home Equipment: None      Prior Function Level of Independence: Independent               Hand Dominance  Extremity/Trunk Assessment   Upper Extremity Assessment Upper Extremity Assessment: Defer to OT evaluation    Lower Extremity Assessment Lower Extremity Assessment: Generalized weakness    Cervical / Trunk Assessment Cervical / Trunk Assessment: Normal  Communication   Communication: No difficulties  Cognition Arousal/Alertness: Awake/alert Behavior During Therapy: WFL for tasks assessed/performed Overall Cognitive Status: Within Functional  Limits for tasks assessed                                        General Comments General comments (skin integrity, edema, etc.): Pt tested positive for right BPPV and treated wtih canalith repositioning maneuver. Symptoms were very dizzy post treatment however hopeful that this will help pts issue.  Pt was on 2 LO2 on arrival. Sats 92% and greater during treatment therefore will try pt off O2.     Exercises     Assessment/Plan    PT Assessment Patient needs continued PT services  PT Problem List Decreased activity tolerance;Decreased balance;Decreased mobility;Decreased knowledge of use of DME;Decreased safety awareness;Decreased knowledge of precautions(dizziness)       PT Treatment Interventions DME instruction;Gait training;Functional mobility training;Therapeutic activities;Therapeutic exercise;Stair training;Balance training;Patient/family education    PT Goals (Current goals can be found in the Care Plan section)  Acute Rehab PT Goals Patient Stated Goal: to go home PT Goal Formulation: With patient Time For Goal Achievement: 04/04/19 Potential to Achieve Goals: Good    Frequency Min 3X/week   Barriers to discharge Decreased caregiver support      Co-evaluation               AM-PAC PT "6 Clicks" Mobility  Outcome Measure Help needed turning from your back to your side while in a flat bed without using bedrails?: A Little Help needed moving from lying on your back to sitting on the side of a flat bed without using bedrails?: A Little Help needed moving to and from a bed to a chair (including a wheelchair)?: A Little Help needed standing up from a chair using your arms (e.g., wheelchair or bedside chair)?: A Little Help needed to walk in hospital room?: A Little Help needed climbing 3-5 steps with a railing? : A Little 6 Click Score: 18    End of Session Equipment Utilized During Treatment: Gait belt;Oxygen Activity Tolerance: Patient limited  by fatigue Patient left: in chair;with call bell/phone within reach Nurse Communication: Mobility status PT Visit Diagnosis: Muscle weakness (generalized) (M62.81);Unsteadiness on feet (R26.81);Dizziness and giddiness (R42);BPPV BPPV - Right/Left : Right    Time: 3435-6861 PT Time Calculation (min) (ACUTE ONLY): 24 min   Charges:   PT Evaluation $PT Eval Moderate Complexity: 1 Mod PT Treatments $Gait Training: 8-22 mins        [place] Pager:  [phone]  Office:  [place] 03/21/2019, 10:05 AM

## 2019-03-22 DIAGNOSIS — M109 Gout, unspecified: Secondary | ICD-10-CM

## 2019-03-22 LAB — BASIC METABOLIC PANEL
Anion gap: 11 (ref 5–15)
BUN: 34 mg/dL — ABNORMAL HIGH (ref 8–23)
CO2: 27 mmol/L (ref 22–32)
Calcium: 7.9 mg/dL — ABNORMAL LOW (ref 8.9–10.3)
Chloride: 97 mmol/L — ABNORMAL LOW (ref 98–111)
Creatinine, Ser: 2.19 mg/dL — ABNORMAL HIGH (ref 0.44–1.00)
GFR calc Af Amer: 24 mL/min — ABNORMAL LOW (ref >60)
GFR calc non Af Amer: 21 mL/min — ABNORMAL LOW (ref >60)
Glucose, Bld: 151 mg/dL — ABNORMAL HIGH (ref 70–99)
Potassium: 3.4 mmol/L — ABNORMAL LOW (ref 3.5–5.1)
Sodium: 135 mmol/L (ref 135–145)

## 2019-03-22 LAB — CBC
HCT: 32 % — ABNORMAL LOW (ref 36.0–46.0)
Hemoglobin: 10.7 g/dL — ABNORMAL LOW (ref 12.0–15.0)
MCH: 30.7 pg (ref 26.0–34.0)
MCHC: 33.4 g/dL (ref 30.0–36.0)
MCV: 91.7 fL (ref 80.0–100.0)
Platelets: 180 10*3/uL (ref 150–400)
RBC: 3.49 MIL/uL — ABNORMAL LOW (ref 3.87–5.11)
RDW: 14.6 % (ref 11.5–15.5)
WBC: 15.4 10*3/uL — ABNORMAL HIGH (ref 4.0–10.5)
nRBC: 0 % (ref 0.0–0.2)

## 2019-03-22 LAB — GLUCOSE, CAPILLARY
Glucose-Capillary: 145 mg/dL — ABNORMAL HIGH (ref 70–99)
Glucose-Capillary: 75 mg/dL (ref 70–99)
Glucose-Capillary: 112 mg/dL — ABNORMAL HIGH (ref 70–99)
Glucose-Capillary: 92 mg/dL (ref 70–99)

## 2019-03-22 MED ORDER — DICLOFENAC SODIUM 1 % TD GEL
2.0000 g | Freq: Four times a day (QID) | TRANSDERMAL | Status: DC
  Administered 2019-03-22 – 2019-03-27 (×17): 2 g via TOPICAL
  Filled 2019-03-22: qty 100

## 2019-03-22 MED ORDER — POTASSIUM CHLORIDE CRYS ER 20 MEQ PO TBCR
40.0000 meq | EXTENDED_RELEASE_TABLET | Freq: Once | ORAL | Status: AC
  Administered 2019-03-22: 40 meq via ORAL
  Filled 2019-03-22: qty 2

## 2019-03-22 NOTE — Progress Notes (Addendum)
PROGRESS NOTE    Samantha Mckenzie  DXA:[id number] DOB: 1936-01-20 DOA: 03/19/2019 PCP: Colon Branch, MD   Brief Narrative:  HPI On 03/20/2019 by Dr. Jennette Kettle Samantha Mckenzie is a 83 y.o. female with medical history significant of CKD stage 4, HTN.  Patient presents to the ED with c/o abd pain, N/V/D.  Symptoms onset suddenly about 1.5h PTA in the ED, persistent, nothing makes better or worse.  Symptoms are severe.  Abd pain is generalized.  Interim history Admitted with sepsis secondary to pneumonia and acute hypoxic respiratory failure.  Also having intractable abdominal pain with unknown cause at this time.  Assessment & Plan   Sepsis secondary to bar community-acquired pneumonia/acute hypoxic respiratory failure -on admission, patient with leukocytosis, tachypnea -Chest x-ray showed cardiomegaly with mild volume overload.  Trace bilateral pleural effusions. -CT abdomen/pelvis did show findings concerning for right lower lobe pneumonia -COVID, respiratory viral panel negative -Blood cultures showed no growth to date -Continue azithromycin and ceftriaxone -Patient was noted to have oxygen saturations in the 70s when she presented to the emergency department, needing 3 L of nasal cannula. -Attempt to wean oxygen to maintain saturations above 90% -question of dysphagia, which places patient at increased risk of aspiration- speech therapy consulted- recommended regular diet with thin liquids  Nausea, vomiting, diarrhea and abdominal pain -Patient denies any recent vomiting or diarrhea but continues to have abdominal pain -CT abdomen and pelvis: Sigmoid diverticulosis without CT evidence of diverticulitis. -?  Musculoskeletal secondary to coughing from pneumonia and recent vomiting -Will order Voltaren gel and K pad -LFTs WNL -Continue pain control and antiemetics -patient with poor appetite   Acute kidney injury chronic kidney disease, stage IV -Resolved with IV fluids    -Creatinine on admission 2.79 -Creatinine back down to baseline, currently 2.19 (Baseline creatinine approximate 1.7-2.4) -Continue to monitor BMP  Essential hypertension -Blood pressure stable, continue amlodipine, metoprolol  Hypokalemia/hypomagnesemia -Improving with replacement.   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 Dispo TBD. Pending improvement in respiratory drive and pain.   Consultants None  Procedures  None  Antibiotics   Anti-infectives (From admission, onward)   Start     Dose/Rate Route Frequency Ordered Stop   03/21/19 0400  azithromycin (ZITHROMAX) tablet 500 mg     500 mg Oral Every 24 hours 03/20/19 0057 03/28/19 0359   03/21/19 0300  cefTRIAXone (ROCEPHIN) 1 g in sodium chloride 0.9 % 100 mL IVPB     1 g 200 mL/hr over 30 Minutes Intravenous Every 24 hours 03/20/19 0057 03/28/19 0259   03/20/19 0015  cefTRIAXone (ROCEPHIN) 1 g in sodium chloride 0.9 % 100 mL IVPB     1 g 200 mL/hr over 30 Minutes Intravenous  Once 03/20/19 0001 03/20/19 0359   03/20/19 0015  azithromycin (ZITHROMAX) 500 mg in sodium chloride 0.9 % 250 mL IVPB     500 mg 250 mL/hr over 60 Minutes Intravenous  Once 03/20/19 0001 03/20/19 0535      Subjective:   Samantha Mckenzie seen and examined today.  Feels breathing is about the same, however continues to  need oxygen. Continues to complain of abdominal pain, more on the right side and worse with movement. Has some nausea without current vomiting or diarrhea. Has poor  appetite and does not wan to eat or drink. Denies current chest pain, dizziness, headache.   Objective:   Vitals:   03/21/19 0842 03/21/19 1622 03/21/19 2258 03/22/19 0745  BP: (!) 165/61 (!) 158/64 (!) 164/71 (!) 168/65  Pulse: 91 97 97 95  Resp: 19 18 19 15   Temp: 98.4 F (36.9 C) 98.4 F (36.9 C) 98.4 F (36.9 C) 98.6 F (37 C)  TempSrc:   Oral Oral  SpO2: 100% 92% 92% 91%  Weight:      Height:        Intake/Output Summary (Last 24 hours) at 03/22/2019 1017 Last data filed at 03/22/2019 0700 Gross per 24 hour  Intake 988.78 ml  Output 1200 ml  Net -211.22 ml   Filed Weights   03/20/19 0700  Weight: 62.2 kg    Exam  General: Well developed, acutely ill appearing, in mild distress from pain  HEENT: NCAT, mucous membranes moist.   Neck: Supple  Cardiovascular: S1 S2 auscultated, no rubs, murmurs or gallops. Regular rate and rhythm.  Respiratory: Clear to auscultation bilaterally with equal chest rise anteriorly   Abdomen: Soft, TTP Right abdomen, nondistended, + bowel sounds  Extremities: warm dry without cyanosis clubbing. Mild LE edema  Neuro: AAOx3, nonfocal  Skin: Without rashes exudates or nodules  Psych: Flat affect, however appropriate    Data Reviewed: I have personally reviewed following labs and imaging studies  CBC: Recent Labs  Lab 03/19/19 2033 03/20/19 0526 03/21/19 0533 03/22/19 0821  WBC 8.9 18.0* 18.5* 15.4*  NEUTROABS 7.5  --   --   --   HGB 10.7* 11.0* 10.8* 10.7*  HCT 32.3* 33.6* 32.4* 32.0*  MCV 92.3 94.9 92.3 91.7  PLT 214 187 183 315   Basic Metabolic Panel: Recent Labs  Lab 03/19/19 2033 03/20/19 0526 03/21/19 0533 03/22/19 0821  NA 136 139 141 135  K 3.3* 3.2* 3.7 3.4*  CL 96* 100 102 97*  CO2 24 23 25 27   GLUCOSE 153* 243* 140* 151*  BUN 41* 36* 31* 34*  CREATININE 2.79* 2.45* 1.88* 2.19*  CALCIUM 8.4* 8.3* 8.3* 7.9*  MG  --  1.2* 2.4  --    GFR: Estimated Creatinine Clearance: 17.8 mL/min (A) (by C-G  formula based on SCr of 2.19 mg/dL (H)). Liver Function Tests: Recent Labs  Lab 03/19/19 2033  AST 32  ALT 19  ALKPHOS 68  BILITOT 0.5  PROT 8.3*  ALBUMIN 3.8   Recent Labs  Lab 03/19/19 2033  LIPASE 69*   No results for input(s): AMMONIA in the last 168 hours. Coagulation Profile: No results for input(s): INR, PROTIME in the last 168 hours. Cardiac Enzymes: No results for input(s): CKTOTAL, CKMB, CKMBINDEX, TROPONINI in the last 168 hours. BNP (last 3 results) No results for input(s): PROBNP in the last 8760 hours. HbA1C: No results for input(s): HGBA1C in the last 72 hours. CBG: Recent Labs  Lab 03/21/19 0725 03/21/19 1205 03/21/19 1618 03/21/19 2125 03/22/19 0745  GLUCAP 118* 152* 106* 133* 145*   Lipid Profile: No results for input(s): CHOL, HDL, LDLCALC, TRIG, CHOLHDL, LDLDIRECT in the last 72 hours. Thyroid Function Tests: No results for input(s): TSH, T4TOTAL, FREET4, T3FREE, THYROIDAB in the last 72 hours. Anemia Panel: No results for input(s): VITAMINB12, FOLATE, FERRITIN, TIBC, IRON, RETICCTPCT in the last 72 hours.  Urine analysis:    Component Value Date/Time   COLORURINE STRAW (A) 03/19/2019 2329   APPEARANCEUR CLEAR 03/19/2019 2329   LABSPEC 1.005 03/19/2019 2329   PHURINE 6.0 03/19/2019 2329   GLUCOSEU 50 (A) 03/19/2019 2329   HGBUR NEGATIVE 03/19/2019 2329   BILIRUBINUR NEGATIVE 03/19/2019 2329   KETONESUR NEGATIVE 03/19/2019 2329   PROTEINUR NEGATIVE 03/19/2019 2329   UROBILINOGEN 0.2 12/29/2014 1400   NITRITE NEGATIVE 03/19/2019 2329   LEUKOCYTESUR NEGATIVE 03/19/2019 2329   Sepsis Labs: @LABRCNTIP (procalcitonin:4,lacticidven:4)  ) Recent Results (from the past 240 hour(s))  SARS Coronavirus 2 (CEPHEID - Performed in [facility] hospital lab), Hosp Order     Status: None   Collection Time: 03/19/19 11:38 PM   Specimen: Nasopharyngeal Swab  Result Value Ref Range Status   SARS Coronavirus 2 NEGATIVE NEGATIVE Final    Comment:  (NOTE) If result is NEGATIVE SARS-CoV-2 target nucleic acids are NOT DETECTED. The SARS-CoV-2 RNA is generally detectable in upper and lower  respiratory specimens during the acute phase of infection. The lowest  concentration of SARS-CoV-2 viral copies this assay can detect is 250  copies / mL. A negative result does not preclude SARS-CoV-2 infection  and should not be used as the sole basis for treatment or other  patient management decisions.  A negative result may occur with  improper specimen collection / handling, submission of specimen other  than nasopharyngeal swab, presence of viral mutation(s) within the  areas targeted by this assay, and inadequate number of viral copies  (<250 copies / mL). A negative result must be combined with clinical  observations, patient history, and epidemiological information. If result is POSITIVE SARS-CoV-2 target nucleic acids are DETECTED. The SARS-CoV-2 RNA is generally detectable in upper and lower  respiratory specimens dur ing the acute phase of infection.  Positive  results are indicative of active infection with SARS-CoV-2.  Clinical  correlation with patient history and other diagnostic information is  necessary to determine patient infection status.  Positive results do  not rule out bacterial infection or co-infection with other viruses. If result is PRESUMPTIVE POSTIVE SARS-CoV-2 nucleic acids MAY BE PRESENT.   A presumptive positive result was obtained on the submitted specimen  and confirmed on repeat testing.  While 2019 novel coronavirus  (SARS-CoV-2) nucleic acids may be present in the submitted sample  additional confirmatory testing may be necessary for epidemiological  and / or clinical management purposes  to differentiate between  SARS-CoV-2 and other Sarbecovirus currently known to infect humans.  If clinically indicated additional testing with an alternate test  methodology [phone]) is advised. The SARS-CoV-2 RNA is  generally  detectable in upper and lower respiratory sp ecimens during the acute  phase of infection. The expected result is Negative. Fact Sheet for Patients:  StrictlyIdeas.no Fact Sheet for Healthcare Providers: BankingDealers.co.za This test is not yet approved or cleared by the [place] FDA and has been authorized for detection and/or diagnosis of SARS-CoV-2 by FDA under an Emergency Use Authorization (EUA).  This EUA will remain in effect (meaning this test can be used) for the duration of the COVID-19 declaration under Section 564(b)(1) of the Act, 21 U.S.C. section 360bbb-3(b)(1), unless the authorization is terminated or revoked sooner. Performed at [facility], [place] [address] [place], [place] [address]   Respiratory Panel by PCR     Status: None   Collection Time: 03/20/19 12:57 AM   Specimen: Nasopharyngeal Swab; Respiratory  Result Value Ref Range Status  Adenovirus NOT DETECTED NOT DETECTED Final   Coronavirus 229E NOT DETECTED NOT DETECTED Final    Comment: (NOTE) The Coronavirus on the Respiratory Panel, DOES NOT test for the novel  Coronavirus (2019 nCoV)    Coronavirus HKU1 NOT DETECTED NOT DETECTED Final   Coronavirus NL63 NOT DETECTED NOT DETECTED Final   Coronavirus OC43 NOT DETECTED NOT DETECTED Final   Metapneumovirus NOT DETECTED NOT DETECTED Final   Rhinovirus / Enterovirus NOT DETECTED NOT DETECTED Final   Influenza A NOT DETECTED NOT DETECTED Final   Influenza B NOT DETECTED NOT DETECTED Final   Parainfluenza Virus 1 NOT DETECTED NOT DETECTED Final   Parainfluenza Virus 2 NOT DETECTED NOT DETECTED Final   Parainfluenza Virus 3 NOT DETECTED NOT DETECTED Final   Parainfluenza Virus 4 NOT DETECTED NOT DETECTED Final   Respiratory Syncytial Virus NOT DETECTED NOT DETECTED Final   Bordetella pertussis NOT DETECTED NOT DETECTED Final   Chlamydophila pneumoniae NOT DETECTED NOT DETECTED Final    Mycoplasma pneumoniae NOT DETECTED NOT DETECTED Final    Comment: Performed at [facility], [place] [address] [place], [place] [address]  Culture, blood (routine x 2) Call MD if unable to obtain prior to antibiotics being given     Status: None (Preliminary result)   Collection Time: 03/20/19  2:48 AM   Specimen: BLOOD RIGHT WRIST  Result Value Ref Range Status   Specimen Description BLOOD RIGHT WRIST  Final   Special Requests   Final    BOTTLES DRAWN AEROBIC ONLY Blood Culture results may not be optimal due to an inadequate volume of blood received in culture bottles   Culture   Final    NO GROWTH 1 DAY Performed at [facility] [address], [place], [place] [address]    Report Status PENDING  Incomplete      Radiology Studies: No results found.   Scheduled Meds:  allopurinol  100 mg Oral Daily   amLODipine  10 mg Oral Daily   aspirin  81 mg Oral Daily   atorvastatin  20 mg Oral Daily   azithromycin  500 mg Oral Q24H   diclofenac sodium  2 g Topical QID   enoxaparin (LOVENOX) injection  30 mg Subcutaneous Q24H   gabapentin  200 mg Oral QHS   insulin aspart  0-5 Units Subcutaneous QHS   insulin aspart  0-9 Units Subcutaneous TID WC   insulin aspart  3 Units Subcutaneous TID WC   metoprolol tartrate  25 mg Oral BID   Continuous Infusions:  sodium chloride 75 mL/hr at 03/22/19 0920   cefTRIAXone (ROCEPHIN)  IV Stopped (03/22/19 0422)     LOS: 2 days   Time Spent in minutes   30 minutes  Yao Hyppolite D.O. on 03/22/2019 at 10:17 AM  Between 7am to 7pm - Please see pager noted on [email or web address]  After 7pm go to [email or web address]  And look for the night coverage person covering for me after hours  [facility] Office  [phone]

## 2019-03-22 NOTE — Progress Notes (Signed)
SATURATION QUALIFICATIONS: (This note is used to comply with regulatory documentation for home oxygen)  Patient Saturations on Room Air at Rest = 83%  Patient Saturations on Room Air while Ambulating = NT as pt desat on RA at rest  Patient Saturations on 2 Liters of oxygen while Ambulating = 90%  Please briefly explain why patient needs home oxygen:Pt desat on RA at rest.  Will possibly need home O2.  Thanks. [place] Pager:  [phone]  Office:  [phone]

## 2019-03-22 NOTE — Progress Notes (Addendum)
Physical Therapy Treatment Patient Details Name: Samantha Mckenzie MRN: [id number] DOB: [birth year] Today's Date: 03/22/2019    History of Present Illness Samantha Mckenzie is an 83 y.o. female with a PMH of stage IV CKD and hypertension who presented to the hospital 03/19/2019 with a chief complaint of abdominal pain associated with nausea, vomiting, and diarrhea.  CT of the abdomen and pelvis showed a right lower lobe pneumonia but no acute intra-abdominal process.  She was also noted to be hypoxic with oxygen saturations in the 70s, which improved with 3 L of oxygen via nasal cannula.  COVID testing was negative.    PT Comments    Pt admitted with above diagnosis. Pt currently with functional limitations due to balance and endurance deficits. Pt was able to ambulate with min assist in room with pt having to have external support of therapist.  Pt struggled using RW as she just can't move it well on her own yet she isnt safe not having UE support and was reaching for objects in room when not using RW.  Pt also needed O2 to maintain sats >90% at rest. Pt reports multiple falls at home.  Have treated her vertigo x 2 now and initially thought that the vertigo was causing imbalance however feel that pt has more going on therefore feel that a SNF stay is warranted prior to d/c home.  Did do another treatment for vertigo but feel pt needs further vestibular treatment as well.  Will follow acutely.  Pt will benefit from skilled PT to increase their independence and safety with mobility to allow discharge to the venue listed below.    SATURATION QUALIFICATIONS: (This note is used to comply with regulatory documentation for home oxygen)  Patient Saturations on Room Air at Rest = 83%  Patient Saturations on Room Air while Ambulating = NT as pt desat on RA at rest  Patient Saturations on 2 Liters of oxygen while Ambulating = 90%  Please briefly explain why patient needs home oxygen:Pt desat on RA at rest.  Will  possibly need home O2.  Follow Up Recommendations  SNF;Supervision/Assistance - 24 hour(vestibular rehab)     Equipment Recommendations  Rolling walker with 5" wheels;3in1 (PT)    Recommendations for Other Services       Precautions / Restrictions Precautions Precautions: Fall Restrictions Weight Bearing Restrictions: No    Mobility  Bed Mobility Overal bed mobility: Needs Assistance Bed Mobility: Supine to Sit     Supine to sit: Min assist     General bed mobility comments: Needed a little assist as pt with decr use of left UE due to lymphadema  Transfers Overall transfer level: Needs assistance Equipment used: 1 person hand held assist;Rolling walker (2 wheeled) Transfers: Sit to/from Stand Sit to Stand: Min guard         General transfer comment: Able to stand and steadying assist given as she had just had the canalith repositioning.   Ambulation/Gait Ambulation/Gait assistance: Min assist Gait Distance (Feet): 45 Feet Assistive device: 1 person hand held assist;Rolling walker (2 wheeled) Gait Pattern/deviations: Step-through pattern;Decreased stride length;Shuffle;Drifts right/left   Gait velocity interpretation: <1.31 ft/sec, indicative of household ambulator General Gait Details: Pt was able to ambulate however very guarded gait.  States she holds onto "furniture" at home.  Slightly unsteady on feet needing steadying assist several times.  With RW, pt very slow and just had a hard time steering and turning RW.  Without the RW, pt reaching for objects to  steady herself.     Stairs             Wheelchair Mobility    Modified Rankin (Stroke Patients Only)       Balance Overall balance assessment: Needs assistance Sitting-balance support: No upper extremity supported;Feet supported Sitting balance-Leahy Scale: Fair     Standing balance support: Single extremity supported;During functional activity Standing balance-Leahy Scale: Poor Standing  balance comment: relies on at least 1 UE support for balance and can stand statically but struggles with dynamic balance wihtout BIl UE support.                             Cognition Arousal/Alertness: Awake/alert Behavior During Therapy: WFL for tasks assessed/performed Overall Cognitive Status: Within Functional Limits for tasks assessed                                        Exercises General Exercises - Lower Extremity Ankle Circles/Pumps: AROM;Both;10 reps;Seated Long Arc Quad: AROM;Both;10 reps;Seated    General Comments General comments (skin integrity, edema, etc.): Pt positive for left BPPV therefore treated pt with canalith repositioning maneuver.  Today, sats on RA at rest were 83%.  With 2L O2, pt was 90%.        Pertinent Vitals/Pain Pain Assessment: No/denies pain    Home Living                      Prior Function            PT Goals (current goals can now be found in the care plan section) Acute Rehab PT Goals Patient Stated Goal: to go home Progress towards PT goals: Progressing toward goals    Frequency    Min 3X/week      PT Plan Discharge plan needs to be updated    Co-evaluation              AM-PAC PT "6 Clicks" Mobility   Outcome Measure  Help needed turning from your back to your side while in a flat bed without using bedrails?: A Little Help needed moving from lying on your back to sitting on the side of a flat bed without using bedrails?: A Little Help needed moving to and from a bed to a chair (including a wheelchair)?: A Little Help needed standing up from a chair using your arms (e.g., wheelchair or bedside chair)?: A Little Help needed to walk in hospital room?: A Little Help needed climbing 3-5 steps with a railing? : A Little 6 Click Score: 18    End of Session Equipment Utilized During Treatment: Gait belt;Oxygen Activity Tolerance: Patient limited by fatigue Patient left: in chair;with  call bell/phone within reach Nurse Communication: Mobility status PT Visit Diagnosis: Muscle weakness (generalized) (M62.81);Unsteadiness on feet (R26.81);Dizziness and giddiness (R42);BPPV BPPV - Right/Left : Right     Time: 7829-5621 PT Time Calculation (min) (ACUTE ONLY): 23 min  Charges:  $Gait Training: 8-22 mins $Canalith Rep Proc: 8-22 mins                     [place] Pager:  [phone]  Office:  [place] 03/22/2019, 1:34 PM

## 2019-03-22 NOTE — Plan of Care (Signed)
  Problem: Clinical Measurements: Goal: Respiratory complications will improve 03/22/2019 1351 by [name], RN Outcome: Progressing 03/22/2019 0847 by [name], RN Outcome: Progressing   Problem: Activity: Goal: Risk for activity intolerance will decrease Outcome: Progressing.Marland KitchenMarland KitchenGot OOB on a chair and stayed for approximately more than 2 hours.

## 2019-03-22 NOTE — Plan of Care (Signed)
  Problem: Education: Goal: Knowledge of General Education information will improve Description: Including pain rating scale, medication(s)/side effects and non-pharmacologic comfort measures Outcome: Progressing   Problem: Health Behavior/Discharge Planning: Goal: Ability to manage health-related needs will improve Outcome: Progressing   Problem: Nutrition: Goal: Adequate nutrition will be maintained Outcome: Progressing   Problem: Coping: Goal: Level of anxiety will decrease Outcome: Progressing   Problem: Pain Managment: Goal: General experience of comfort will improve Outcome: Not Progressing

## 2019-03-23 ENCOUNTER — Inpatient Hospital Stay (HOSPITAL_COMMUNITY): Payer: Medicare Other

## 2019-03-23 LAB — BASIC METABOLIC PANEL
Anion gap: 12 (ref 5–15)
BUN: 41 mg/dL — ABNORMAL HIGH (ref 8–23)
CO2: 25 mmol/L (ref 22–32)
Calcium: 8.3 mg/dL — ABNORMAL LOW (ref 8.9–10.3)
Chloride: 101 mmol/L (ref 98–111)
Creatinine, Ser: 2.63 mg/dL — ABNORMAL HIGH (ref 0.44–1.00)
GFR calc Af Amer: 19 mL/min — ABNORMAL LOW (ref >60)
GFR calc non Af Amer: 17 mL/min — ABNORMAL LOW (ref >60)
Glucose, Bld: 124 mg/dL — ABNORMAL HIGH (ref 70–99)
Potassium: 3.9 mmol/L (ref 3.5–5.1)
Sodium: 138 mmol/L (ref 135–145)

## 2019-03-23 LAB — GLUCOSE, CAPILLARY
Glucose-Capillary: 115 mg/dL — ABNORMAL HIGH (ref 70–99)
Glucose-Capillary: 115 mg/dL — ABNORMAL HIGH (ref 70–99)
Glucose-Capillary: 126 mg/dL — ABNORMAL HIGH (ref 70–99)
Glucose-Capillary: 100 mg/dL — ABNORMAL HIGH (ref 70–99)

## 2019-03-23 LAB — HEPATIC FUNCTION PANEL
ALT: 18 U/L (ref 0–44)
AST: 22 U/L (ref 15–41)
Albumin: 2.4 g/dL — ABNORMAL LOW (ref 3.5–5.0)
Alkaline Phosphatase: 73 U/L (ref 38–126)
Bilirubin, Direct: 0.1 mg/dL (ref 0.0–0.2)
Indirect Bilirubin: 0.4 mg/dL (ref 0.3–0.9)
Total Bilirubin: 0.5 mg/dL (ref 0.3–1.2)
Total Protein: 7 g/dL (ref 6.5–8.1)

## 2019-03-23 LAB — CBC
HCT: 32.3 % — ABNORMAL LOW (ref 36.0–46.0)
Hemoglobin: 10.8 g/dL — ABNORMAL LOW (ref 12.0–15.0)
MCH: 30.9 pg (ref 26.0–34.0)
MCHC: 33.4 g/dL (ref 30.0–36.0)
MCV: 92.3 fL (ref 80.0–100.0)
Platelets: 196 10*3/uL (ref 150–400)
RBC: 3.5 MIL/uL — ABNORMAL LOW (ref 3.87–5.11)
RDW: 14.8 % (ref 11.5–15.5)
WBC: 16.6 10*3/uL — ABNORMAL HIGH (ref 4.0–10.5)
nRBC: 0 % (ref 0.0–0.2)

## 2019-03-23 LAB — LIPASE, BLOOD: Lipase: 29 U/L (ref 11–51)

## 2019-03-23 LAB — TSH: TSH: 1.025 u[IU]/mL (ref 0.350–4.500)

## 2019-03-23 LAB — VITAMIN B12: Vitamin B-12: 2183 pg/mL — ABNORMAL HIGH (ref 180–914)

## 2019-03-23 NOTE — Progress Notes (Signed)
PROGRESS NOTE    Samantha Mckenzie  MOL:[id number] DOB: Jun 02, 1936 DOA: 03/19/2019 PCP: Colon Branch, MD   Brief Narrative:  HPI On 03/20/2019 by Dr. Jennette Kettle Samantha Mckenzie is a 83 y.o. female with medical history significant of CKD stage 4, HTN.  Patient presents to the ED with c/o abd pain, N/V/D.  Symptoms onset suddenly about 1.5h PTA in the ED, persistent, nothing makes better or worse.  Symptoms are severe.  Abd pain is generalized.  Interim history Admitted with sepsis secondary to pneumonia and acute hypoxic respiratory failure.  Also having intractable abdominal pain with unknown cause at this time.  Assessment & Plan   Sepsis secondary to bar community-acquired pneumonia/acute hypoxic respiratory failure -on admission, patient with leukocytosis, tachypnea -Chest x-ray showed cardiomegaly with mild volume overload.  Trace bilateral pleural effusions. -CT abdomen/pelvis did show findings concerning for right lower lobe pneumonia -COVID, respiratory viral panel negative -Blood cultures showed no growth to date -Continue azithromycin and ceftriaxone -Patient was noted to have oxygen saturations in the 70s when she presented to the emergency department, needing 2 L of nasal cannula. -Attempt to wean oxygen to maintain saturations above 90%- patient O2 saturations at rest on room air drop to 83%, but rebound to 90 with 2L -question of dysphagia, which places patient at increased risk of aspiration- speech therapy consulted- recommended regular diet with thin liquids  Nausea, vomiting, diarrhea and abdominal pain -Patient denies any recent vomiting or diarrhea but continues to have abdominal pain -CT abdomen and pelvis: Sigmoid diverticulosis without CT evidence of diverticulitis. -?  Musculoskeletal secondary to coughing from pneumonia and recent vomiting- also states that the pain worsens with movement -Continue Voltaren gel and K pad -LFTs and lipase WNL -Continue pain  control and antiemetics -patient with poor appetite  -will obtain Abd [place]  Acute kidney injury chronic kidney disease, stage IV -Resolved with IV fluids  -Creatinine on admission 2.79 -Creatinine back down to baseline, currently 2.6 (Baseline creatinine approximate 1.7-2.4) -Continue to monitor BMP  Upper extremity tremors -patient complains of UE tremors when her hands get cold only and that she cannot pick up a cup of coffee or even the phone -on examination, no tremors noted when UE are fully extended  -?due to weakness and recent poor oral intake as well as infection -discussed with neurology, Dr. Leonel Ramsay, referred to this as possible Enhanced physiological tremors. No treatment, will get better with time.  Essential hypertension -Blood pressure stable, continue amlodipine, metoprolol  Hypokalemia/hypomagnesemia -Improving with replacement.   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 Son via phone.  Disposition Plan: Admitted. Dispo SNF. Pending improvement in respiratory drive and pain.   Consultants Neurology, Dr. Leonel Ramsay, via phone  Procedures  None  Antibiotics   Anti-infectives (From admission, onward)   Start     Dose/Rate Route Frequency Ordered Stop   03/21/19 0400  azithromycin (ZITHROMAX) tablet 500 mg     500 mg Oral Every  24 hours 03/20/19 0057  03/28/19 0359   03/21/19 0300  cefTRIAXone (ROCEPHIN) 1 g in sodium chloride 0.9 % 100 mL IVPB     1 g 200 mL/hr over 30 Minutes Intravenous Every 24 hours 03/20/19 0057 03/28/19 0259   03/20/19 0015  cefTRIAXone (ROCEPHIN) 1 g in sodium chloride 0.9 % 100 mL IVPB     1 g 200 mL/hr over 30 Minutes Intravenous  Once 03/20/19 0001 03/20/19 0359   03/20/19 0015  azithromycin (ZITHROMAX) 500 mg in sodium chloride 0.9 % 250 mL IVPB     500 mg 250 mL/hr over 60 Minutes Intravenous  Once 03/20/19 0001 03/20/19 0535      Subjective:   Samantha Mckenzie seen and examined today.  Breathing is about the same but continues to need oxygen.  Denies any cough at this time.  Continues to complain of abdominal pain and states that occurs more with movement and is more on the right side.  She denies any nausea or vomiting at this time.  Denies any further diarrhea.  Denies current chest pain, dizziness or headache.  Not complaining of tremors that occur when her hands are cold and states she is unable to pick up a cup of coffee or even the telephone.  States her husband had similar situation and was treated at the [facility].  Objective:   Vitals:   03/22/19 1709 03/22/19 2100 03/22/19 2302 03/23/19 0736  BP: (!) 162/61 (!) 145/67 (!) 159/71 (!) 152/66  Pulse: 100 97 96 97  Resp: 16  18 16   Temp: 97.8 F (36.6 C) 98.9 F (37.2 C) (!) 97.5 F (36.4 C) 98.5 F (36.9 C)  TempSrc: Oral Oral Oral Oral  SpO2: 93% 95% 96% 97%  Weight:      Height:        Intake/Output Summary (Last 24 hours) at 03/23/2019 0948 Last data filed at 03/23/2019 0100 Gross per 24 hour  Intake 214.74 ml  Output 1200 ml  Net -985.26 ml   Filed Weights   03/20/19 0700  Weight: 62.2 kg   Exam  General: Well developed, acutely ill appearing, but improved from previous day, NAD  HEENT: NCAT, mucous membranes moist.   Neck: Supple  Cardiovascular: S1 S2 auscultated, RRR, no murmur  Respiratory: Clear to auscultation  bilaterally with equal chest rise  Abdomen: Soft, TTP Right abdomen, nondistended, + bowel sounds  Extremities: warm dry without cyanosis clubbing or edema  Neuro: AAOx3, nonfocal  Psych: Appropriate mood and affect  Data Reviewed: I have personally reviewed following labs and imaging studies  CBC: Recent Labs  Lab 03/19/19 2033 03/20/19 0526 03/21/19 0533 03/22/19 0821 03/23/19 0556  WBC 8.9 18.0* 18.5* 15.4* 16.6*  NEUTROABS 7.5  --   --   --   --   HGB 10.7* 11.0* 10.8* 10.7* 10.8*  HCT 32.3* 33.6* 32.4* 32.0* 32.3*  MCV 92.3 94.9 92.3 91.7 92.3  PLT 214 187 183 180 254   Basic Metabolic Panel: Recent Labs  Lab 03/19/19 2033 03/20/19 0526 03/21/19 0533 03/22/19 0821 03/23/19 0556  NA 136 139 141 135 138  K 3.3* 3.2* 3.7 3.4* 3.9  CL 96* 100 102 97* 101  CO2 24 23 25 27 25   GLUCOSE 153* 243* 140* 151* 124*  BUN 41* 36* 31* 34* 41*  CREATININE 2.79* 2.45* 1.88* 2.19* 2.63*  CALCIUM 8.4* 8.3* 8.3* 7.9* 8.3*  MG  --  1.2* 2.4  --   --    GFR: Estimated  Creatinine Clearance: 14.8 mL/min (A) (by C-G formula based on SCr of 2.63 mg/dL (H)). Liver Function Tests: Recent Labs  Lab 03/19/19 2033 03/23/19 0556  AST 32 22  ALT 19 18  ALKPHOS 68 73  BILITOT 0.5 0.5  PROT 8.3* 7.0  ALBUMIN 3.8 2.4*   Recent Labs  Lab 03/19/19 2033 03/23/19 0556  LIPASE 69* 29   No results for input(s): AMMONIA in the last 168 hours. Coagulation Profile: No results for input(s): INR, PROTIME in the last 168 hours. Cardiac Enzymes: No results for input(s): CKTOTAL, CKMB, CKMBINDEX, TROPONINI in the last 168 hours. BNP (last 3 results) No results for input(s): PROBNP in the last 8760 hours. HbA1C: No results for input(s): HGBA1C in the last 72 hours. CBG: Recent Labs  Lab 03/22/19 0745 03/22/19 1151 03/22/19 1710 03/22/19 2128 03/23/19 0737  GLUCAP 145* 75 112* 92 115*   Lipid Profile: No results for input(s): CHOL, HDL, LDLCALC, TRIG, CHOLHDL, LDLDIRECT in the  last 72 hours. Thyroid Function Tests: No results for input(s): TSH, T4TOTAL, FREET4, T3FREE, THYROIDAB in the last 72 hours. Anemia Panel: No results for input(s): VITAMINB12, FOLATE, FERRITIN, TIBC, IRON, RETICCTPCT in the last 72 hours. Urine analysis:    Component Value Date/Time   COLORURINE STRAW (A) 03/19/2019 2329   APPEARANCEUR CLEAR 03/19/2019 2329   LABSPEC 1.005 03/19/2019 2329   PHURINE 6.0 03/19/2019 2329   GLUCOSEU 50 (A) 03/19/2019 2329   HGBUR NEGATIVE 03/19/2019 2329   BILIRUBINUR NEGATIVE 03/19/2019 2329   KETONESUR NEGATIVE 03/19/2019 2329   PROTEINUR NEGATIVE 03/19/2019 2329   UROBILINOGEN 0.2 12/29/2014 1400   NITRITE NEGATIVE 03/19/2019 2329   LEUKOCYTESUR NEGATIVE 03/19/2019 2329   Sepsis Labs: @LABRCNTIP (procalcitonin:4,lacticidven:4)  ) Recent Results (from the past 240 hour(s))  SARS Coronavirus 2 (CEPHEID - Performed in [facility] lab), Hosp Order     Status: None   Collection Time: 03/19/19 11:38 PM   Specimen: Nasopharyngeal Swab  Result Value Ref Range Status   SARS Coronavirus 2 NEGATIVE NEGATIVE Final    Comment: (NOTE) If result is NEGATIVE SARS-CoV-2 target nucleic acids are NOT DETECTED. The SARS-CoV-2 RNA is generally detectable in upper and lower  respiratory specimens during the acute phase of infection. The lowest  concentration of SARS-CoV-2 viral copies this assay can detect is 250  copies / mL. A negative result does not preclude SARS-CoV-2 infection  and should not be used as the sole basis for treatment or other  patient management decisions.  A negative result may occur with  improper specimen collection / handling, submission of specimen other  than nasopharyngeal swab, presence of viral mutation(s) within the  areas targeted by this assay, and inadequate number of viral copies  (<250 copies / mL). A negative result must be combined with clinical  observations, patient history, and epidemiological information. If  result is POSITIVE SARS-CoV-2 target nucleic acids are DETECTED. The SARS-CoV-2 RNA is generally detectable in upper and lower  respiratory specimens dur ing the acute phase of infection.  Positive  results are indicative of active infection with SARS-CoV-2.  Clinical  correlation with patient history and other diagnostic information is  necessary to determine patient infection status.  Positive results do  not rule out bacterial infection or co-infection with other viruses. If result is PRESUMPTIVE POSTIVE SARS-CoV-2 nucleic acids MAY BE PRESENT.   A presumptive positive result was obtained on the submitted specimen  and confirmed on repeat testing.  While 2019 novel coronavirus  (SARS-CoV-2) nucleic acids may  be present in the submitted sample  additional confirmatory testing may be necessary for epidemiological  and / or clinical management purposes  to differentiate between  SARS-CoV-2 and other Sarbecovirus currently known to infect humans.  If clinically indicated additional testing with an alternate test  methodology [phone]) is advised. The SARS-CoV-2 RNA is generally  detectable in upper and lower respiratory sp ecimens during the acute  phase of infection. The expected result is Negative. Fact Sheet for Patients:  StrictlyIdeas.no Fact Sheet for Healthcare Providers: BankingDealers.co.za This test is not yet approved or cleared by the [place] FDA and has been authorized for detection and/or diagnosis of SARS-CoV-2 by FDA under an Emergency Use Authorization (EUA).  This EUA will remain in effect (meaning this test can be used) for the duration of the COVID-19 declaration under Section 564(b)(1) of the Act, 21 U.S.C. section 360bbb-3(b)(1), unless the authorization is terminated or revoked sooner. Performed at [facility], [place] [address], [place], [place] [address]   Respiratory Panel by PCR     Status: None    Collection Time: 03/20/19 12:57 AM   Specimen: Nasopharyngeal Swab; Respiratory  Result Value Ref Range Status   Adenovirus NOT DETECTED NOT DETECTED Final   Coronavirus 229E NOT DETECTED NOT DETECTED Final    Comment: (NOTE) The Coronavirus on the Respiratory Panel, DOES NOT test for the novel  Coronavirus (2019 nCoV)    Coronavirus HKU1 NOT DETECTED NOT DETECTED Final   Coronavirus NL63 NOT DETECTED NOT DETECTED Final   Coronavirus OC43 NOT DETECTED NOT DETECTED Final   Metapneumovirus NOT DETECTED NOT DETECTED Final   Rhinovirus / Enterovirus NOT DETECTED NOT DETECTED Final   Influenza A NOT DETECTED NOT DETECTED Final   Influenza B NOT DETECTED NOT DETECTED Final   Parainfluenza Virus 1 NOT DETECTED NOT DETECTED Final   Parainfluenza Virus 2 NOT DETECTED NOT DETECTED Final   Parainfluenza Virus 3 NOT DETECTED NOT DETECTED Final   Parainfluenza Virus 4 NOT DETECTED NOT DETECTED Final   Respiratory Syncytial Virus NOT DETECTED NOT DETECTED Final   Bordetella pertussis NOT DETECTED NOT DETECTED Final   Chlamydophila pneumoniae NOT DETECTED NOT DETECTED Final   Mycoplasma pneumoniae NOT DETECTED NOT DETECTED Final    Comment: Performed at [facility], [place]. [address] [place], [place] [address]  Culture, blood (routine x 2) Call MD if unable to obtain prior to antibiotics being given     Status: None (Preliminary result)   Collection Time: 03/20/19  2:48 AM   Specimen: BLOOD RIGHT WRIST  Result Value Ref Range Status   Specimen Description BLOOD RIGHT WRIST  Final   Special Requests   Final    BOTTLES DRAWN AEROBIC ONLY Blood Culture results may not be optimal due to an inadequate volume of blood received in culture bottles   Culture   Final    NO GROWTH 3 DAYS Performed at [facility], [place] [address], [place], [place] [address]    Report Status PENDING  Incomplete  Culture, blood (routine x 2) Call MD if unable to obtain prior to antibiotics being given     Status:  None (Preliminary result)   Collection Time: 03/20/19  5:26 AM   Specimen: BLOOD LEFT HAND  Result Value Ref Range Status   Specimen Description BLOOD LEFT HAND  Final   Special Requests   Final    BOTTLES DRAWN AEROBIC ONLY Blood Culture results may not be optimal due to an inadequate volume  of blood received in culture bottles   Culture   Final    NO GROWTH 3 DAYS Performed at [facility], [place] [address], [place], [place] [address]    Report Status PENDING  Incomplete      Radiology Studies: No results found.   Scheduled Meds: . allopurinol  100 mg Oral Daily  . amLODipine  10 mg Oral Daily  . aspirin  81 mg Oral Daily  . atorvastatin  20 mg Oral Daily  . azithromycin  500 mg Oral Q24H  . diclofenac sodium  2 g Topical QID  . enoxaparin (LOVENOX) injection  30 mg Subcutaneous Q24H  . gabapentin  200 mg Oral QHS  . insulin aspart  0-5 Units Subcutaneous QHS  . insulin aspart  0-9 Units Subcutaneous TID WC  . insulin aspart  3 Units Subcutaneous TID WC  . metoprolol tartrate  25 mg Oral BID   Continuous Infusions: . sodium chloride 75 mL/hr at 03/22/19 1000  . cefTRIAXone (ROCEPHIN)  IV 1 g (03/23/19 0202)     LOS: 3 days   Time Spent in minutes   45 minutes (greater than 50% of time spent with patient face to face, as well as reviewing old records, discussing with consultant and family, and formulating a plan)   Parys Elenbaas D.O. on 03/23/2019 at 9:48 AM  Between 7am to 7pm - Please see pager noted on [email or web address]  After 7pm go to [email or web address]  And look for the night coverage person covering for me after hours  [facility] Office  [phone]

## 2019-03-23 NOTE — Plan of Care (Signed)
  Problem: Activity: Goal: Risk for activity intolerance will decrease Outcome: Progressing   Problem: Nutrition: Goal: Adequate nutrition will be maintained Outcome: Progressing   Problem: Safety: Goal: Ability to remain free from injury will improve Outcome: Progressing   Problem: Pain Managment: Goal: General experience of comfort will improve Outcome: Progressing

## 2019-03-23 NOTE — Progress Notes (Signed)
Physical Therapy Treatment Patient Details Name: Samantha Mckenzie MRN: [id number] DOB: 02/09/1936 Today's Date: 03/23/2019    History of Present Illness Samantha Mckenzie is an 83 y.o. female with a PMH of stage IV CKD and hypertension who presented to the hospital 03/19/2019 with a chief complaint of abdominal pain associated with nausea, vomiting, and diarrhea.  CT of the abdomen and pelvis showed a right lower lobe pneumonia but no acute intra-abdominal process.  She was also noted to be hypoxic with oxygen saturations in the 70s, which improved with 3 L of oxygen via nasal cannula.  COVID testing was negative.    PT Comments    Pt admitted with above diagnosis. Pt currently with functional limitations due to balance and endurance deficits. Pt was able to ambulate in hallway and progress distance today. List to right needing min assist for balance.  Pt still fatigues quickly and very nauseated at end of walk and stated, "I wish the [facility] would take me.  I feel so bad. "  Will continue to follow pt and progress as able.   Pt will benefit from skilled PT to increase their independence and safety with mobility to allow discharge to the venue listed below.     Follow Up Recommendations  SNF;Supervision/Assistance - 24 hour(vestibular rehab)     Equipment Recommendations  Rolling walker with 5" wheels;3in1 (PT)    Recommendations for Other Services       Precautions / Restrictions Precautions Precautions: Fall Restrictions Weight Bearing Restrictions: No    Mobility  Bed Mobility Overal bed mobility: Needs Assistance Bed Mobility: Supine to Sit     Supine to sit: Min assist     General bed mobility comments: Needed a little assist as pt with decr use of left UE due to lymphadema  Transfers Overall transfer level: Needs assistance Equipment used: 1 person hand held assist;Rolling walker (2 wheeled) Transfers: Sit to/from Stand Sit to Stand: Min guard         General transfer  comment: cues for hand placement  Ambulation/Gait Ambulation/Gait assistance: Min assist Gait Distance (Feet): 220 Feet Assistive device: Rolling walker (2 wheeled) Gait Pattern/deviations: Step-through pattern;Decreased stride length;Shuffle;Drifts right/left   Gait velocity interpretation: <1.31 ft/sec, indicative of household ambulator General Gait Details: Pt was able to ambulate with RW with better cadence today not as guarded.  Pt lists to her right and needed cues and assist at times.    Slightly unsteady on feet needing steadying assist several times.  Pt did a little better with steering and turning RW.  Without the RW, pt reaching for objects to steady herself.     Stairs             Wheelchair Mobility    Modified Rankin (Stroke Patients Only)       Balance Overall balance assessment: Needs assistance Sitting-balance support: No upper extremity supported;Feet supported Sitting balance-Leahy Scale: Fair     Standing balance support: Single extremity supported;During functional activity Standing balance-Leahy Scale: Poor Standing balance comment: relies on at least 1 UE support for balance and can stand statically but struggles with dynamic balance wihtout BIl UE support.                             Cognition Arousal/Alertness: Awake/alert Behavior During Therapy: WFL for tasks assessed/performed Overall Cognitive Status: Within Functional Limits for tasks assessed  Exercises General Exercises - Lower Extremity Ankle Circles/Pumps: AROM;Both;10 reps;Seated Long Arc Quad: AROM;Both;10 reps;Seated    General Comments General comments (skin integrity, edema, etc.): Did not test for BPPV as concentrated on walking today and once back from walk, pt used 3N1 and then c/o nausea and wanted to lie down. Notified nursing regarding nausea. Of note, pt on RA stayed at 92% during walk today.         Pertinent Vitals/Pain Pain Assessment: No/denies pain    Home Living                      Prior Function            PT Goals (current goals can now be found in the care plan section) Acute Rehab PT Goals Patient Stated Goal: to go home Progress towards PT goals: Progressing toward goals    Frequency    Min 3X/week      PT Plan Current plan remains appropriate    Co-evaluation              AM-PAC PT "6 Clicks" Mobility   Outcome Measure  Help needed turning from your back to your side while in a flat bed without using bedrails?: A Little Help needed moving from lying on your back to sitting on the side of a flat bed without using bedrails?: A Little Help needed moving to and from a bed to a chair (including a wheelchair)?: A Little Help needed standing up from a chair using your arms (e.g., wheelchair or bedside chair)?: A Little Help needed to walk in hospital room?: A Little Help needed climbing 3-5 steps with a railing? : A Little 6 Click Score: 18    End of Session Equipment Utilized During Treatment: Gait belt;Oxygen Activity Tolerance: Patient limited by fatigue Patient left: with call bell/phone within reach;in bed;with bed alarm set Nurse Communication: Mobility status(nausea per pt) PT Visit Diagnosis: Muscle weakness (generalized) (M62.81);Unsteadiness on feet (R26.81);Dizziness and giddiness (R42);BPPV BPPV - Right/Left : Right     Time: 7981-0254 PT Time Calculation (min) (ACUTE ONLY): 21 min  Charges:  $Gait Training: 8-22 mins                     [place] Pager:  [phone]  Office:  [place] 03/23/2019, 2:48 PM

## 2019-03-24 LAB — BASIC METABOLIC PANEL
Anion gap: 13 (ref 5–15)
BUN: 46 mg/dL — ABNORMAL HIGH (ref 8–23)
CO2: 22 mmol/L (ref 22–32)
Calcium: 8.1 mg/dL — ABNORMAL LOW (ref 8.9–10.3)
Chloride: 105 mmol/L (ref 98–111)
Creatinine, Ser: 2.45 mg/dL — ABNORMAL HIGH (ref 0.44–1.00)
GFR calc Af Amer: 21 mL/min — ABNORMAL LOW (ref >60)
GFR calc non Af Amer: 18 mL/min — ABNORMAL LOW (ref >60)
Glucose, Bld: 94 mg/dL (ref 70–99)
Potassium: 3.7 mmol/L (ref 3.5–5.1)
Sodium: 140 mmol/L (ref 135–145)

## 2019-03-24 LAB — CBC
HCT: 30.7 % — ABNORMAL LOW (ref 36.0–46.0)
Hemoglobin: 10 g/dL — ABNORMAL LOW (ref 12.0–15.0)
MCH: 30.2 pg (ref 26.0–34.0)
MCHC: 32.6 g/dL (ref 30.0–36.0)
MCV: 92.7 fL (ref 80.0–100.0)
Platelets: 198 10*3/uL (ref 150–400)
RBC: 3.31 MIL/uL — ABNORMAL LOW (ref 3.87–5.11)
RDW: 14.7 % (ref 11.5–15.5)
WBC: 10.6 10*3/uL — ABNORMAL HIGH (ref 4.0–10.5)
nRBC: 0 % (ref 0.0–0.2)

## 2019-03-24 LAB — GLUCOSE, CAPILLARY
Glucose-Capillary: 86 mg/dL (ref 70–99)
Glucose-Capillary: 158 mg/dL — ABNORMAL HIGH (ref 70–99)
Glucose-Capillary: 108 mg/dL — ABNORMAL HIGH (ref 70–99)
Glucose-Capillary: 114 mg/dL — ABNORMAL HIGH (ref 70–99)
Glucose-Capillary: 152 mg/dL — ABNORMAL HIGH (ref 70–99)

## 2019-03-24 LAB — VITAMIN D 25 HYDROXY (VIT D DEFICIENCY, FRACTURES): Vit D, 25-Hydroxy: 61.5 ng/mL (ref 30.0–100.0)

## 2019-03-24 NOTE — Progress Notes (Addendum)
PROGRESS NOTE    Samantha Mckenzie  LKJ:[id number] DOB: 08/22/1936 DOA: 03/19/2019 PCP: Colon Branch, MD   Brief Narrative:  HPI On 03/20/2019 by Dr. Jennette Kettle Samantha Mckenzie is a 83 y.o. female with medical history significant of CKD stage 4, HTN.  Patient presents to the ED with c/o abd pain, N/V/D.  Symptoms onset suddenly about 1.5h PTA in the ED, persistent, nothing makes better or worse.  Symptoms are severe.  Abd pain is generalized.  Interim history Admitted with sepsis secondary to pneumonia and acute hypoxic respiratory failure.  Also having intractable abdominal pain with unknown cause at this time.  Assessment & Plan   Sepsis secondary to bar community-acquired pneumonia/acute hypoxic respiratory failure -on admission, patient with leukocytosis, tachypnea -Chest x-ray showed cardiomegaly with mild volume overload.  Trace bilateral pleural effusions. -CT abdomen/pelvis did show findings concerning for right lower lobe pneumonia -COVID, respiratory viral panel negative -Blood cultures showed no growth to date -Continue azithromycin and ceftriaxone -Patient was noted to have oxygen saturations in the 70s when she presented to the emergency department, needing 2 L of nasal cannula. -Attempt to wean oxygen to maintain saturations above 90%- patient O2 saturations at rest on room air drop to 83%, but rebound to 90 with 2L -question of dysphagia, which places patient at increased risk of aspiration- speech therapy consulted- recommended regular diet with thin liquids  Nausea, vomiting, diarrhea and abdominal pain -Patient denies any recent vomiting or diarrhea but continues to have abdominal pain -CT abdomen and pelvis: Sigmoid diverticulosis without CT evidence of diverticulitis. -?  Musculoskeletal secondary to coughing from pneumonia and recent vomiting- also states that the pain worsens with movement -Continue Voltaren gel and K pad -LFTs and lipase WNL -Continue pain  control and antiemetics -Abdominal US: cholelithiasis and gallbladder sludge without complicating factors  Acute kidney injury chronic kidney disease, stage IV -Resolved with IV fluids  -Creatinine on admission 2.79 Baseline creatinine approximate 1.7-2.4 -Abd [place]: increased echogenicity consistent with medical renal disease. R renal cyst -Pending labs this morning   Upper extremity tremors -patient complains of UE tremors when her hands get cold only and that she cannot pick up a cup of coffee or even the phone -on examination, no tremors noted when UE are fully extended  -?due to weakness and recent poor oral intake as well as infection -discussed with neurology, Dr. Leonel Ramsay, referred to this as possible Enhanced physiological tremors. No treatment, will get better with time. -obtained Vit D 25-hydroxy 61.5, Vitamin B12 2,183, TSH 1.025     Essential hypertension -Blood pressure stable, continue amlodipine, metoprolol  Hypokalemia/hypomagnesemia -Improving with replacement.   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 Dispo TBD- likely home with home health  Consultants Neurology, Dr. Leonel Ramsay, via phone  Procedures  Abd [place]  Antibiotics   Anti-infectives (From admission, onward)    Start  Dose/Rate Route Frequency Ordered Stop   03/21/19 0400  azithromycin (ZITHROMAX) tablet 500 mg     500 mg Oral Every 24 hours 03/20/19 0057 03/28/19 0359   03/21/19 0300  cefTRIAXone (ROCEPHIN) 1 g in sodium chloride 0.9 % 100 mL IVPB     1 g 200 mL/hr over 30 Minutes Intravenous Every 24 hours 03/20/19 0057 03/28/19 0259   03/20/19 0015  cefTRIAXone (ROCEPHIN) 1 g in sodium chloride 0.9 % 100 mL IVPB     1 g 200 mL/hr over 30 Minutes Intravenous  Once 03/20/19 0001 03/20/19 0359   03/20/19 0015  azithromycin (ZITHROMAX) 500 mg in sodium chloride 0.9 % 250 mL IVPB     500 mg 250 mL/hr over 60 Minutes Intravenous  Once 03/20/19 0001 03/20/19 0535      Subjective:   Samantha Mckenzie seen and examined today.  Breathing is about the same and she feels comfortable.  She eats she feels mildly better as compared to the past week.  She does state that she has somewhat of an appetite this morning would like to eat some fruit.  She still complains of abdominal pain on the right side especially with movement.  Denies current chest pain, nausea or vomiting, dizziness or headache. Still complains of "hand shaking" when they get cold.   Objective:   Vitals:   03/23/19 1007 03/23/19 1654 03/23/19 2200 03/24/19 0726  BP: (!) 150/61 (!) 160/66 (!) 160/58 (!) 145/62  Pulse:  90 88 91  Resp:  14 16 16   Temp:  98.5 F (36.9 C) 98.5 F (36.9 C) 98.5 F (36.9 C)  TempSrc:  Oral Oral Oral  SpO2:  97% 95% 97%  Weight:      Height:        Intake/Output Summary (Last 24 hours) at 03/24/2019 0855 Last data filed at 03/24/2019 0400 Gross per 24 hour  Intake -  Output 1150 ml  Net -1150 ml   Filed Weights   03/20/19 0700  Weight: 62.2 kg   Exam  General: Well developed, elderly, No apparent distress  HEENT: NCAT, mucous membranes moist.   Neck: Supple  Cardiovascular: S1 S2 auscultated, RRR, no murmur   Respiratory: Clear to auscultation bilaterally with equal chest rise   Abdomen: Soft, Right side TTP, nondistended, + bowel sounds  Extremities: warm dry without cyanosis clubbing or edema  Neuro: AAOx3, nonfocal  Skin: Without rashes exudates or nodules  Psych: Appropriate mood and affect  Data Reviewed: I have personally reviewed following labs and imaging studies  CBC: Recent Labs  Lab 03/19/19 2033 03/20/19 0526 03/21/19 0533 03/22/19 0821 03/23/19 0556  WBC 8.9 18.0* 18.5* 15.4* 16.6*  NEUTROABS 7.5  --   --   --   --   HGB 10.7* 11.0* 10.8* 10.7* 10.8*  HCT 32.3* 33.6* 32.4* 32.0* 32.3*  MCV 92.3 94.9 92.3 91.7 92.3  PLT 214 187 183 180 732   Basic Metabolic Panel: Recent Labs  Lab 03/19/19 2033 03/20/19 0526 03/21/19 0533 03/22/19 0821 03/23/19 0556  NA 136 139 141 135 138  K 3.3* 3.2* 3.7 3.4* 3.9  CL 96* 100 102 97* 101  CO2 24 23 25 27 25   GLUCOSE 153* 243* 140* 151* 124*  BUN 41* 36* 31* 34* 41*  CREATININE 2.79* 2.45* 1.88* 2.19* 2.63*  CALCIUM 8.4* 8.3* 8.3* 7.9* 8.3*  MG  --  1.2* 2.4  --   --    GFR: Estimated Creatinine Clearance: 14.8 mL/min (A) (by C-G  formula based on SCr of 2.63 mg/dL (H)). Liver Function Tests: Recent Labs  Lab 03/19/19 2033 03/23/19 0556  AST 32 22  ALT 19 18  ALKPHOS 68 73  BILITOT 0.5 0.5  PROT 8.3* 7.0  ALBUMIN 3.8 2.4*   Recent Labs  Lab 03/19/19 2033 03/23/19 0556  LIPASE 69* 29   No results for input(s): AMMONIA in the last 168 hours. Coagulation Profile: No results for input(s): INR, PROTIME in the last 168 hours. Cardiac Enzymes: No results for input(s): CKTOTAL, CKMB, CKMBINDEX, TROPONINI in the last 168 hours. BNP (last 3 results) No results for input(s): PROBNP in the last 8760 hours. HbA1C: No results for input(s): HGBA1C in the last 72 hours. CBG: Recent Labs  Lab 03/23/19 0737 03/23/19 1159 03/23/19 1655 03/23/19 2106 03/24/19 0726  GLUCAP 115* 115* 126* 100* 86   Lipid Profile: No results for input(s): CHOL, HDL, LDLCALC, TRIG, CHOLHDL, LDLDIRECT in  the last 72 hours. Thyroid Function Tests: Recent Labs    03/23/19 1057  TSH 1.025   Anemia Panel: Recent Labs    03/23/19 1057  VITAMINB12 2,183*   Urine analysis:    Component Value Date/Time   COLORURINE STRAW (A) 03/19/2019 2329   APPEARANCEUR CLEAR 03/19/2019 2329   LABSPEC 1.005 03/19/2019 2329   PHURINE 6.0 03/19/2019 2329   GLUCOSEU 50 (A) 03/19/2019 2329   HGBUR NEGATIVE 03/19/2019 2329   BILIRUBINUR NEGATIVE 03/19/2019 2329   KETONESUR NEGATIVE 03/19/2019 2329   PROTEINUR NEGATIVE 03/19/2019 2329   UROBILINOGEN 0.2 12/29/2014 1400   NITRITE NEGATIVE 03/19/2019 2329   LEUKOCYTESUR NEGATIVE 03/19/2019 2329   Sepsis Labs: @LABRCNTIP (procalcitonin:4,lacticidven:4)  ) Recent Results (from the past 240 hour(s))  SARS Coronavirus 2 (CEPHEID - Performed in [place] hospital lab), Hosp Order     Status: None   Collection Time: 03/19/19 11:38 PM   Specimen: Nasopharyngeal Swab  Result Value Ref Range Status   SARS Coronavirus 2 NEGATIVE NEGATIVE Final    Comment: (NOTE) If result is NEGATIVE SARS-CoV-2 target nucleic acids are NOT DETECTED. The SARS-CoV-2 RNA is generally detectable in upper and lower  respiratory specimens during the acute phase of infection. The lowest  concentration of SARS-CoV-2 viral copies this assay can detect is 250  copies / mL. A negative result does not preclude SARS-CoV-2 infection  and should not be used as the sole basis for treatment or other  patient management decisions.  A negative result may occur with  improper specimen collection / handling, submission of specimen other  than nasopharyngeal swab, presence of viral mutation(s) within the  areas targeted by this assay, and inadequate number of viral copies  (<250 copies / mL). A negative result must be combined with clinical  observations, patient history, and epidemiological information. If result is POSITIVE SARS-CoV-2 target nucleic acids are DETECTED. The SARS-CoV-2 RNA  is generally detectable in upper and lower  respiratory specimens dur ing the acute phase of infection.  Positive  results are indicative of active infection with SARS-CoV-2.  Clinical  correlation with patient history and other diagnostic information is  necessary to determine patient infection status.  Positive results do  not rule out bacterial infection or co-infection with other viruses. If result is PRESUMPTIVE POSTIVE SARS-CoV-2 nucleic acids MAY BE PRESENT.   A presumptive positive result was obtained on the submitted specimen  and confirmed on repeat testing.  While 2019 novel coronavirus  (SARS-CoV-2) nucleic acids may be present in the submitted sample  additional confirmatory testing may be  necessary for epidemiological  and / or clinical management purposes  to differentiate between  SARS-CoV-2 and other Sarbecovirus currently known to infect humans.  If clinically indicated additional testing with an alternate test  methodology [phone]) is advised. The SARS-CoV-2 RNA is generally  detectable in upper and lower respiratory sp ecimens during the acute  phase of infection. The expected result is Negative. Fact Sheet for Patients:  StrictlyIdeas.no Fact Sheet for Healthcare Providers: BankingDealers.co.za This test is not yet approved or cleared by the [place] FDA and has been authorized for detection and/or diagnosis of SARS-CoV-2 by FDA under an Emergency Use Authorization (EUA).  This EUA will remain in effect (meaning this test can be used) for the duration of the COVID-19 declaration under Section 564(b)(1) of the Act, 21 U.S.C. section 360bbb-3(b)(1), unless the authorization is terminated or revoked sooner. Performed at [facility], [place] [address] [place], [place] [address]   Respiratory Panel by PCR     Status: None   Collection Time: 03/20/19 12:57 AM   Specimen: Nasopharyngeal Swab; Respiratory   Result Value Ref Range Status   Adenovirus NOT DETECTED NOT DETECTED Final   Coronavirus 229E NOT DETECTED NOT DETECTED Final    Comment: (NOTE) The Coronavirus on the Respiratory Panel, DOES NOT test for the novel  Coronavirus (2019 nCoV)    Coronavirus HKU1 NOT DETECTED NOT DETECTED Final   Coronavirus NL63 NOT DETECTED NOT DETECTED Final   Coronavirus OC43 NOT DETECTED NOT DETECTED Final   Metapneumovirus NOT DETECTED NOT DETECTED Final   Rhinovirus / Enterovirus NOT DETECTED NOT DETECTED Final   Influenza A NOT DETECTED NOT DETECTED Final   Influenza B NOT DETECTED NOT DETECTED Final   Parainfluenza Virus 1 NOT DETECTED NOT DETECTED Final   Parainfluenza Virus 2 NOT DETECTED NOT DETECTED Final   Parainfluenza Virus 3 NOT DETECTED NOT DETECTED Final   Parainfluenza Virus 4 NOT DETECTED NOT DETECTED Final   Respiratory Syncytial Virus NOT DETECTED NOT DETECTED Final   Bordetella pertussis NOT DETECTED NOT DETECTED Final   Chlamydophila pneumoniae NOT DETECTED NOT DETECTED Final   Mycoplasma pneumoniae NOT DETECTED NOT DETECTED Final    Comment: Performed at [facility], [place]. [address] [place], [place] [address]  Culture, blood (routine x 2) Call MD if unable to obtain prior to antibiotics being given     Status: None (Preliminary result)   Collection Time: 03/20/19  2:48 AM   Specimen: BLOOD RIGHT WRIST  Result Value Ref Range Status   Specimen Description BLOOD RIGHT WRIST  Final   Special Requests   Final    BOTTLES DRAWN AEROBIC ONLY Blood Culture results may not be optimal due to an inadequate volume of blood received in culture bottles   Culture   Final    NO GROWTH 4 DAYS Performed at [facility] [address] [place], [place] [address]    Report Status PENDING  Incomplete  Culture, blood (routine x 2) Call MD if unable to obtain prior to antibiotics being given     Status: None (Preliminary result)   Collection Time: 03/20/19  5:26 AM   Specimen: BLOOD  LEFT HAND  Result Value Ref Range Status   Specimen Description BLOOD LEFT HAND  Final   Special Requests   Final    BOTTLES DRAWN AEROBIC ONLY Blood Culture results may not be optimal due to an inadequate volume of blood received in culture bottles   Culture   Final  NO GROWTH 4 DAYS Performed at [facility], [place] [address], [place], [place] [address]    Report Status PENDING  Incomplete      Radiology Studies: US Abdomen Complete  Result Date: 03/23/2019 CLINICAL DATA:  Abdominal pain, acute renal injury EXAM: ABDOMEN ULTRASOUND COMPLETE COMPARISON:  03/19/2019 CT of the abdomen and pelvis FINDINGS: Gallbladder: Well distended with cholelithiasis and gallbladder sludge. No pericholecystic fluid is noted. Negative sonographic Murphy's sign is elicited. Common bile duct: Diameter: 3.9 mm. Liver: No focal lesion identified. Within normal limits in parenchymal echogenicity. Portal vein is patent on color Doppler imaging with normal direction of blood flow towards the liver. IVC: No abnormality visualized. Pancreas: Visualized portion unremarkable. Spleen: Size and appearance within normal limits. Right Kidney: Length: 7.3 cm. 2.5 cm cyst is noted in the midportion of the right kidney stable from the prior CT. Increased echogenicity is noted. Left Kidney: Length: 7.5 cm. No hydronephrosis is noted. Prominent extrarenal pelvis on the left is noted similar to that seen on CT. Increased echogenicity is noted. Abdominal aorta: No aneurysm visualized. Other findings: None. IMPRESSION: Cholelithiasis and gallbladder sludge without complicating factors. Increased echogenicity consistent with medical renal disease. Right renal cyst. Electronically Signed   By: Inez Catalina M.D.   On: 03/23/2019 15:20     Scheduled Meds: . allopurinol  100 mg Oral Daily  . amLODipine  10 mg Oral Daily  . aspirin  81 mg Oral Daily  . atorvastatin  20 mg Oral Daily  . azithromycin  500 mg Oral Q24H  . diclofenac  sodium  2 g Topical QID  . enoxaparin (LOVENOX) injection  30 mg Subcutaneous Q24H  . gabapentin  200 mg Oral QHS  . insulin aspart  0-5 Units Subcutaneous QHS  . insulin aspart  0-9 Units Subcutaneous TID WC  . insulin aspart  3 Units Subcutaneous TID WC  . metoprolol tartrate  25 mg Oral BID   Continuous Infusions: . sodium chloride 75 mL/hr at 03/23/19 2221  . cefTRIAXone (ROCEPHIN)  IV 1 g (03/24/19 0346)     LOS: 4 days   Time Spent in minutes   30 minutes   Shaine Mount D.O. on 03/24/2019 at 8:55 AM  Between 7am to 7pm - Please see pager noted on [email or web address]  After 7pm go to [email or web address]  And look for the night coverage person covering for me after hours  [facility] Office  [phone]

## 2019-03-25 LAB — CULTURE, BLOOD (ROUTINE X 2)
Culture: NO GROWTH
Culture: NO GROWTH

## 2019-03-25 LAB — GLUCOSE, CAPILLARY
Glucose-Capillary: 121 mg/dL — ABNORMAL HIGH (ref 70–99)
Glucose-Capillary: 75 mg/dL (ref 70–99)
Glucose-Capillary: 111 mg/dL — ABNORMAL HIGH (ref 70–99)
Glucose-Capillary: 101 mg/dL — ABNORMAL HIGH (ref 70–99)

## 2019-03-25 MED ORDER — PEG 3350-KCL-NA BICARB-NACL 420 G PO SOLR
4000.0000 mL | Freq: Once | ORAL | Status: AC
  Administered 2019-03-25: 4000 mL via ORAL
  Filled 2019-03-25: qty 4000

## 2019-03-25 NOTE — TOC Initial Note (Signed)
Transition of Care [facility]) - Initial/Assessment Note    Patient Details  Name: Samantha Mckenzie MRN: [id number] Date of Birth: 1936-06-22  Transition of Care [facility]) CM/SW Contact:    Gelene Mink, [place] Phone Number: 03/25/2019, 5:36 PM  Clinical Narrative:                  Before the CSW went into the patient's room, the charge nurse made the CSW aware that the patient is expressing suicidal ideations. CSW stated that she was not aware but would address it with the patient.   CSW met with the patient at bedside. The patient was alert and oriented. The patient was willing to speak with the CSW. CSW introduced herself and explained her role. CSW shared the therapy recommendation. The patient declined SNF, the patient would like to go home with home health. CSW explained the process and left the list at bedside. The patient will make a decision on which agency she would like to use. The patient lives at home with her husband. The patient would like to receive services at home.   CSW addressed the suicidal ideations with the patient. She stated that she did not want to harm herself she just wanted to die. The patient shared that "she is praying for the lord to bring her home". CSW asked why she felt that way, she stated that she is a very sick woman and she does not know why. CSW asked if she had a plan to harm herself, she stated no she didn't. CSW asked if she had any intention to kill herself, she stated no. The patient wants to go home but also wants all her pain to stop. CSW asked the patient if she would be okay with the palliative team speaking with her, she agreed.   CSW stated that she would let the patient rest but the nurse case manager would follow up with her tomorrow about setting up home health services.   CSW spoke with Dr. Ree Kida about the patient's suicidal ideations. Dr. Ree Kida stated that she has already met with the patient and completed her note. The patient does not express  any means of harming herself or anyone. Dr. Ree Kida said that the patient is in pain and experiencing situational depression.  CSW will continue to follow and assist with disposition planning.    Expected Discharge Plan: [place] Barriers to Discharge: Continued Medical Work up   Patient Goals and CMS Choice Patient states their goals for this hospitalization and ongoing recovery are:: Pt wants her pain to stop and to feel better CMS Medicare.gov Compare Post Acute Care list provided to:: Patient Choice offered to / list presented to : Patient  Expected Discharge Plan and Services Expected Discharge Plan: [place] In-house Referral: Clinical Social Work Discharge Planning Services: NA Post Acute Care Choice: Home Health                                        Prior Living Arrangements/Services   Lives with:: Self, Spouse Patient language and need for interpreter reviewed:: No Do you feel safe going back to the place where you live?: Yes      Need for Family Participation in Patient Care: No (Comment) Care giver support system in place?: Yes (comment)   Criminal Activity/Legal Involvement Pertinent to Current Situation/Hospitalization: No - Comment as  needed  Activities of Daily Living Home Assistive Devices/Equipment: None ADL Screening (condition at time of admission) Patient's cognitive ability adequate to safely complete daily activities?: Yes Is the patient deaf or have difficulty hearing?: No Does the patient have difficulty seeing, even when wearing glasses/contacts?: No Does the patient have difficulty concentrating, remembering, or making decisions?: No Patient able to express need for assistance with ADLs?: Yes Does the patient have difficulty dressing or bathing?: Yes Independently performs ADLs?: Yes (appropriate for developmental age) Does the patient have difficulty walking or climbing stairs?: Yes Weakness of  Legs: Both Weakness of Arms/Hands: None  Permission Sought/Granted Permission sought to share information with : Case Manager Permission granted to share information with : Yes, Verbal Permission Granted     Permission granted to share info w AGENCY: Declined SNF        Emotional Assessment Appearance:: Appears stated age     Orientation: : Oriented to Self, Oriented to Place, Oriented to  Time, Oriented to Situation Alcohol / Substance Use: Not Applicable Psych Involvement: No (comment)  Admission diagnosis:  N,V Patient Active Problem List   Diagnosis Date Noted  . Dysphasia 03/21/2019  . CAP (community acquired pneumonia) 03/20/2019  . Nausea vomiting and diarrhea 03/20/2019  . Acute respiratory failure with hypoxia ([place]) 03/20/2019  . CKD (chronic kidney disease), stage IV ([place]) 03/20/2019  . AKI (acute kidney injury) ([place]) 03/20/2019  . Lactic acidosis 03/20/2019  . Diabetic peripheral neuropathy associated with type 2 diabetes mellitus ([place]) 03/20/2019  . Hypomagnesemia 06/21/2018  . PCP NOTES >>> 07/11/2015  . Depression   . VITAMIN B12 DEFICIENCY 04/01/2008  . GERD 12/19/2007  . NEOP, MALIGNANT, FEMALE BREAST NOS 07/07/2007  . RENAL INSUFFICIENCY 07/07/2007  . Osteoarthritis 07/07/2007  . Diabetes mellitus type 2 with complications ([place]) 25/85/2778  . Hyperlipidemia 02/28/2007  . GOUT 02/28/2007  . Normocytic anemia 02/28/2007  . Essential hypertension 02/28/2007   PCP:  Colon Branch, MD Pharmacy:   [facility], [place] [address] [place] [address] Phone: [phone] Fax: [facility] [address] [place]. [place]. [place] [address] Phone: [phone] Fax: [phone]     Social Determinants of Health (SDOH) Interventions    Readmission Risk Interventions No flowsheet data found.
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
## 2019-03-25 NOTE — Progress Notes (Signed)
Multiple conflicting comments made to RN, Charge RN, GI MD, and attending MD throughout the morning concerning for suicidal ideation or thoughts in that realm. When asked by myself if she had plans to commit suicide herself she replied "No, I don't want to do it myself, I want someone else to do it. Will you do it?"   She expresses frustration in not feeling better as the reason behind these remarks. She is refusing meals, citing poor appetite. When asked persistently about moving/getting up to the chair she says she will get up later.   Due to concern for patient safety, the chaplain has been consulted and is preparing to speak with her now. Will continue to closely monitor for changes in behavior, thoughts of actively planning suicide, or other hints concerning for self harm.   Ellwood Handler, RN 03/25/19 11:47 AM

## 2019-03-25 NOTE — Consult Note (Signed)
Cross cover [facility] Reason for Consult: Abdominal pain. Referring Physician: THP  Samantha Mckenzie is an 83 y.o. female.  HPI: Samantha Mckenzie is a 83 year old Hispanic female with multiple medical problems listed below admitted to the hospital on 03/19/2019 with nausea vomiting and diarrhea.  Patient was diagnosed with a pneumonia on admission was treated with Rocephin and azithromycin her COVID test was negative.  She was hypoxic hypoxemic with sats in the 70s when she came in and is improved significantly with oxygen supplementation and antibiotics.  On a CT scan of the abdomen and pelvis done on admission] noncontrasted CT] no acute abnormalities were noted; diverticulosis without diverticulitis was noted. Patient seems to have recovered from pneumonia and is been doing well but continues to complain of right lower quadrant pain. There are no specific relieving or aggravating factors. She denies a history of melena hematochezia. She has a history of acid reflux and has been on PPIs off and on. There is history of constipation alternating with diarrhea. Records reviewed shows a visit with Dr. Scarlette Shorts last year for viral gastroenteritis. Patient's last EGD was done in 2016 and was essentially normal she had a colonoscopy done in 2012 when she was noted to have a few sigmoid diverticula with no masses or polyps. She has a longstanding history of diabetes with peripheral neuropathy and acute on chronic stage IV kidney disease. On detailed questioning patient claims she is depressed and does not want to go on in her life like this anymore is no one is able to help her with her symptoms. She did not admit any suicidal ideation to me and I did not ask any direct questions..She claims she has no desire to go on like this anymore.   Past Medical History:  Diagnosis Date  . Allergic rhinitis   . Anemia   . Breast CA ([place])    surgery, chemo, XRT; had peripheral neuropathy (imbalance at times) after chemo  .  Cellulitis    Left arm, recurrent   . Cellulitis LEFT arm recurrent 12/08/2011  . Chronic renal disease, stage IV ([facility])    Dr. Moshe Cipro  . Depression   . Fatigue   . GERD (gastroesophageal reflux disease)    gastritis, EGD 02/2007  . Gout   . Hyperlipidemia   . Hypertension   . Macular degeneration 2011  . Normal cardiac stress test 12/2014  . Osteoarthritis   . Renal insufficiency    Past Surgical History:  Procedure Laterality Date  . cataracts bilaterally  1 -2012  . LUNG BIOPSY  1999   neg  . MASTECTOMY Left 1999   and lymphnodes    Family History  Problem Relation Age of Onset  . Breast cancer Other        ? of   . Heart attack Father 39  . Diabetes Sister        2  . Hypertension Sister   . Colon cancer Neg Hx   . Stomach cancer Neg Hx    Social History:  reports that she has never smoked. She has never used smokeless tobacco. She reports current alcohol use. She reports that she does not use drugs.  Allergies:  Allergies  Allergen Reactions  . Codeine Nausea And Vomiting  . Morphine Nausea And Vomiting  . Oxycodone-Acetaminophen Nausea And Vomiting  . Sulfonamide Derivatives Nausea And Vomiting  . Tramadol Hcl Nausea And Vomiting   Medications: I have reviewed the patient's current medications.  Results for orders placed  or performed during the hospital encounter of 03/19/19 (from the past 48 hour(s))  Vitamin B12     Status: Abnormal   Collection Time: 03/23/19 10:57 AM  Result Value Ref Range   Vitamin B-12 2,183 (H) 180 - 914 pg/mL    Comment: (NOTE) This assay is not validated for testing neonatal or myeloproliferative syndrome specimens for Vitamin B12 levels. Performed at [facility], [place] [address] [place], [place] [address]   VITAMIN D 25 Hydroxy (Vit-D Deficiency, Fractures)     Status: None   Collection Time: 03/23/19 10:57 AM  Result Value Ref Range   Vit D, 25-Hydroxy 61.5 30.0 - 100.0 ng/mL    Comment: (NOTE) Vitamin D  deficiency has been defined by the [place] practice guideline as a level of serum 25-OH vitamin D less than 20 ng/mL (1,2). The Endocrine Society went on to further define vitamin D insufficiency as a level between 21 and 29 ng/mL (2). 1. IOM (Institute of Medicine). 2010. Dietary reference   intakes for calcium and D. [place]: The   [employer]. 2. Holick MF, Binkley [place], Bischoff-Ferrari HA, et al.   Evaluation, treatment, and prevention of vitamin D   deficiency: an Endocrine Society clinical practice   guideline. JCEM. 2011 Jul; 96(7):1911-30. Performed At: [facility] [place], [place] [id number] Rush Farmer MD [id number]   TSH     Status: None   Collection Time: 03/23/19 10:57 AM  Result Value Ref Range   TSH 1.025 0.350 - 4.500 uIU/mL    Comment: Performed by a 3rd Generation assay with a functional sensitivity of <=0.01 uIU/mL. Performed at [facility], [place] [address], [place], [place] [address]   Glucose, capillary     Status: Abnormal   Collection Time: 03/23/19 11:59 AM  Result Value Ref Range   Glucose-Capillary 115 (H) 70 - 99 mg/dL  Glucose, capillary     Status: Abnormal   Collection Time: 03/23/19  4:55 PM  Result Value Ref Range   Glucose-Capillary 126 (H) 70 - 99 mg/dL  Glucose, capillary     Status: Abnormal   Collection Time: 03/23/19  9:06 PM  Result Value Ref Range   Glucose-Capillary 100 (H) 70 - 99 mg/dL  Glucose, capillary     Status: None   Collection Time: 03/24/19  7:26 AM  Result Value Ref Range   Glucose-Capillary 86 70 - 99 mg/dL  CBC     Status: Abnormal   Collection Time: 03/24/19  8:27 AM  Result Value Ref Range   WBC 10.6 (H) 4.0 - 10.5 K/uL   RBC 3.31 (L) 3.87 - 5.11 MIL/uL   Hemoglobin 10.0 (L) 12.0 - 15.0 g/dL   HCT 30.7 (L) 36.0 - 46.0 %   MCV 92.7 80.0 - 100.0 fL   MCH 30.2 26.0 - 34.0 pg   MCHC 32.6 30.0 - 36.0 g/dL   RDW 14.7 11.5 -  15.5 %   Platelets 198 150 - 400 K/uL   nRBC 0.0 0.0 - 0.2 %    Comment: Performed at [facility], [place]. [address], [place], [place] [address]  Basic metabolic panel     Status: Abnormal   Collection Time: 03/24/19  8:27 AM  Result Value Ref Range   Sodium 140 135 - 145 mmol/L   Potassium 3.7 3.5 - 5.1 mmol/L   Chloride 105 98 - 111 mmol/L   CO2 22 22 -  32 mmol/L   Glucose, Bld 94 70 - 99 mg/dL   BUN 46 (H) 8 - 23 mg/dL   Creatinine, Ser 2.45 (H) 0.44 - 1.00 mg/dL   Calcium 8.1 (L) 8.9 - 10.3 mg/dL   GFR calc non Af Amer 18 (L) >60 mL/min   GFR calc Af Amer 21 (L) >60 mL/min   Anion gap 13 5 - 15    Comment: Performed at [facility] [address] [place], [place] [address]  Glucose, capillary     Status: Abnormal   Collection Time: 03/24/19 11:30 AM  Result Value Ref Range   Glucose-Capillary 158 (H) 70 - 99 mg/dL  Glucose, capillary     Status: Abnormal   Collection Time: 03/24/19  1:29 PM  Result Value Ref Range   Glucose-Capillary 108 (H) 70 - 99 mg/dL  Glucose, capillary     Status: Abnormal   Collection Time: 03/24/19  4:52 PM  Result Value Ref Range   Glucose-Capillary 114 (H) 70 - 99 mg/dL  Glucose, capillary     Status: Abnormal   Collection Time: 03/24/19  9:00 PM  Result Value Ref Range   Glucose-Capillary 152 (H) 70 - 99 mg/dL  Glucose, capillary     Status: Abnormal   Collection Time: 03/25/19  7:37 AM  Result Value Ref Range   Glucose-Capillary 121 (H) 70 - 99 mg/dL   US Abdomen Complete  Result Date: 03/23/2019 CLINICAL DATA:  Abdominal pain, acute renal injury EXAM: ABDOMEN ULTRASOUND COMPLETE COMPARISON:  03/19/2019 CT of the abdomen and pelvis FINDINGS: Gallbladder: Well distended with cholelithiasis and gallbladder sludge. No pericholecystic fluid is noted. Negative sonographic Murphy's sign is elicited. Common bile duct: Diameter: 3.9 mm. Liver: No focal lesion identified. Within normal limits in parenchymal echogenicity. Portal vein is  patent on color Doppler imaging with normal direction of blood flow towards the liver. IVC: No abnormality visualized. Pancreas: Visualized portion unremarkable. Spleen: Size and appearance within normal limits. Right Kidney: Length: 7.3 cm. 2.5 cm cyst is noted in the midportion of the right kidney stable from the prior CT. Increased echogenicity is noted. Left Kidney: Length: 7.5 cm. No hydronephrosis is noted. Prominent extrarenal pelvis on the left is noted similar to that seen on CT. Increased echogenicity is noted. Abdominal aorta: No aneurysm visualized. Other findings: None. IMPRESSION: Cholelithiasis and gallbladder sludge without complicating factors. Increased echogenicity consistent with medical renal disease. Right renal cyst. Electronically Signed   By: Inez Catalina M.D.   On: 03/23/2019 15:20   Review of Systems  Constitutional: Positive for malaise/fatigue. Negative for chills, diaphoresis, fever and weight loss.  HENT: Negative.   Gastrointestinal: Positive for abdominal pain, constipation and heartburn. Negative for blood in stool, diarrhea, melena, nausea and vomiting.  Genitourinary: Negative.   Musculoskeletal: Positive for joint pain.  Skin: Negative.   Neurological: Negative.   Endo/Heme/Allergies: Negative.   Psychiatric/Behavioral: Positive for depression. Negative for hallucinations, memory loss and substance abuse. The patient is nervous/anxious. The patient does not have insomnia.    Blood pressure (!) 147/77, pulse 90, temperature 98.2 F (36.8 C), temperature source Oral, resp. rate 17, height 5' 2"  (1.575 m), weight 62.2 kg, SpO2 93 %. Physical Exam  Constitutional: She is oriented to person, place, and time. She appears well-developed and well-nourished.  HENT:  Head: Normocephalic and atraumatic.  Eyes: Pupils are equal, round, and reactive to light. Conjunctivae and EOM are normal.  Neck: Normal range of motion. Neck supple.  Cardiovascular: Normal rate  and  regular rhythm.  Respiratory: Effort normal and breath sounds normal.  GI: Soft. Bowel sounds are normal. She exhibits no distension, no fluid wave and no ascites. There is abdominal tenderness in the right lower quadrant.  RLQ tenderness with gaurding but without rebound or rigidity; no masses or palpable  Neurological: She is alert and oriented to person, place, and time.  Skin: Skin is warm and dry.  Psychiatric: Her speech is normal. Judgment normal. She is withdrawn. Cognition and memory are normal. She exhibits a depressed mood.   Assessment/Plan: RLQ pain/Constipation-I have advised the patient to try some NuLYTELY to see if it helps her symptoms and improves her constipation. This may relieve the right lower quadrant pain that she is having. CT scan shows no acute abnormalities in that area.  2) Sgmoid diverticulosis without diverticulitis.  The patient's right lower quadrant pain seems to be out of proportion to her physical exam and CT finding. 3) cholelithiasis with gallbladder sludge on abdominal ultrasound-patient denies any right upper quadrant pain at this time she does not associate any of her symptoms to be worsened postprandially. 4) Recent sepsis from community-acquired pneumonia with small pleural effusions, Covid negative. 5) Severe depression-patient will benefit from a psychiatric consult.  I have informed her nurse about the conversation she had with me. 6) CKD Stage IV. Estefany Goebel 03/25/2019, 10:21 AM

## 2019-03-25 NOTE — Progress Notes (Addendum)
PROGRESS NOTE    Samantha Mckenzie  SEG:[id number] DOB: [birth year] DOA: 03/19/2019 PCP: Colon Branch, MD   Brief Narrative:  HPI On 03/20/2019 by Dr. Jennette Kettle Samantha Mckenzie is a 83 y.o. female with medical history significant of CKD stage 4, HTN.  Patient presents to the ED with c/o abd pain, N/V/D.  Symptoms onset suddenly about 1.5h PTA in the ED, persistent, nothing makes better or worse.  Symptoms are severe.  Abd pain is generalized.  Interim history Admitted with sepsis secondary to pneumonia and acute hypoxic respiratory failure.  Also having intractable abdominal pain with unknown cause at this time.  Assessment & Plan   Sepsis secondary to bar community-acquired pneumonia/acute hypoxic respiratory failure -on admission, patient with leukocytosis, tachypnea -Chest x-ray showed cardiomegaly with mild volume overload.  Trace bilateral pleural effusions. -CT abdomen/pelvis did show findings concerning for right lower lobe pneumonia -COVID, respiratory viral panel negative -Blood cultures showed no growth to date -Continue azithromycin and ceftriaxone -Patient was noted to have oxygen saturations in the 70s when she presented to the emergency department, needing 2 L of nasal cannula. -Currently on room air and maintaining O2 saturations in the 90s -question of dysphagia, which places patient at increased risk of aspiration- speech therapy consulted- recommended regular diet with thin liquids  Nausea, vomiting, diarrhea and abdominal pain -Patient denies any recent vomiting or diarrhea but continues to have abdominal pain -CT abdomen and pelvis: Sigmoid diverticulosis without CT evidence of diverticulitis. -?  Musculoskeletal secondary to coughing from pneumonia and recent vomiting- also states that the pain worsens with movement -Continue Voltaren gel and K pad -LFTs and lipase WNL -Continue pain control and antiemetics -Abdominal US: cholelithiasis and gallbladder sludge  without complicating factors -Will consult Gastroenterology for continued abdominal pain  Acute kidney injury chronic kidney disease, stage IV -Resolved with IV fluids  -Creatinine on admission 2.79 (Baseline creatinine approximate 1.7-2.4) -Creatinine currently 2.45 -Abd [place]: increased echogenicity consistent with medical renal disease. R renal cyst -Continue to monitor BMP  Upper extremity tremors -patient complains of UE tremors when her hands get cold only and that she cannot pick up a cup of coffee or even the phone -on examination, no tremors noted when UE are fully extended  -?due to weakness and recent poor oral intake as well as infection -discussed with neurology, Dr. Leonel Ramsay, referred to this as possible Enhanced physiological tremors. No treatment, will get better with time. -obtained Vit D 25-hydroxy 61.5, Vitamin B12 2,183, TSH 1.025    -Improved  Essential hypertension -Blood pressure stable, continue amlodipine, metoprolol  Hypokalemia/hypomagnesemia -Resolved with replacement.   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 -Continue NovoLog 3 units 3 times daily WC, insulin sliding scale with CBG monitoring  Gout  -Stable, continue allopurinol  Diabetic peripheral neuropathy -Continue Neurontin  Hyperlipidemia -Continue statin  GERD -Continue PPI  Deconditioning -per PT, patient fell several times prior to admission -she is visible weak when trying to use the walker and SNF was recommended -Social work consulted -discussed SNF with patient- she states she would rather go home with home heatlh  Addendum Situational depression -Received a call from RN regarding poss suicidal ideation -I spoke with patient, she states she feels like "Shit and wants to feel better". She has no intentions of wanting to harm herself or commit suicide. Asked patient if she would want a psychiatry consult, she declined. -will  continue to monitor   DVT Prophylaxis  lovenox  Code Status: Full  Family Communication: none at bedside  Disposition Plan: Admitted. Dispo TBD- likely home with home health  Consultants Neurology, Dr. Leonel Ramsay, via phone Gastroenterology   Procedures  Abd [place]  Antibiotics   Anti-infectives (From admission, onward)   Start     Dose/Rate Route Frequency Ordered Stop   03/21/19 0400  azithromycin (ZITHROMAX) tablet 500 mg     500 mg Oral Every 24 hours 03/20/19 0057 03/28/19 0359   03/21/19 0300  cefTRIAXone (ROCEPHIN) 1 g in sodium chloride 0.9 % 100 mL IVPB     1 g 200 mL/hr over 30 Minutes Intravenous Every 24 hours 03/20/19 0057 03/28/19 0259   03/20/19 0015  cefTRIAXone (ROCEPHIN) 1 g in sodium chloride 0.9 % 100 mL IVPB     1 g 200 mL/hr over 30 Minutes Intravenous  Once 03/20/19 0001 03/20/19 0359   03/20/19 0015  azithromycin (ZITHROMAX) 500 mg in sodium chloride 0.9 % 250 mL IVPB     500 mg 250 mL/hr over 60 Minutes Intravenous  Once 03/20/19 0001 03/20/19 0535      Subjective:   Samantha Mckenzie seen and examined today.  Breathing has improved and currently off of oxygen.  Continues to complain of right-sided abdominal pain and states it is worse with movement.  Has no appetite this morning.  Denies any nausea or vomiting, diarrhea or constipation.  Denies chest pain, dizziness or headache.  Feels hand shakiness has improved.  Objective:   Vitals:   03/24/19 0726 03/24/19 1650 03/24/19 2259 03/25/19 0834  BP: (!) 145/62 (!) 169/68 (!) 162/64 (!) 147/77  Pulse: 91 98 92 90  Resp: 16 17    Temp: 98.5 F (36.9 C) 98.3 F (36.8 C) 99.5 F (37.5 C) 98.2 F (36.8 C)  TempSrc: Oral Oral Oral Oral  SpO2: 97% 91% 95% 93%  Weight:      Height:        Intake/Output Summary (Last 24 hours) at 03/25/2019 0915 Last data filed at 03/25/2019 0800 Gross per 24 hour  Intake 1720 ml  Output --  Net 1720 ml   Filed Weights   03/20/19 0700  Weight: 62.2 kg    Exam  General: Well developed, well nourished, NAD, appears stated age  9: NCAT, mucous membranes moist.   Cardiovascular: S1 S2 auscultated, RRR, no murmur  Respiratory: Clear to auscultation bilaterally with equal chest rise  Abdomen: Soft, R TTP, nondistended, + bowel sounds  Extremities: warm dry without cyanosis clubbing or edema  Neuro: AAOx3, nonfocal  Psych: Appropriate mood and affect  Data Reviewed: I have personally reviewed following labs and imaging studies  CBC: Recent Labs  Lab 03/19/19 2033 03/20/19 0526 03/21/19 0533 03/22/19 0821 03/23/19 0556 03/24/19 0827  WBC 8.9 18.0* 18.5* 15.4* 16.6* 10.6*  NEUTROABS 7.5  --   --   --   --   --   HGB 10.7* 11.0* 10.8* 10.7* 10.8* 10.0*  HCT 32.3* 33.6* 32.4* 32.0* 32.3* 30.7*  MCV 92.3 94.9 92.3 91.7 92.3 92.7  PLT 214 187 183 180 196 357   Basic Metabolic Panel: Recent Labs  Lab 03/20/19 0526 03/21/19 0533 03/22/19 0821 03/23/19 0556 03/24/19 0827  NA 139 141 135 138 140  K 3.2* 3.7 3.4* 3.9 3.7  CL 100 102 97* 101 105  CO2 23 25 27 25 22   GLUCOSE 243* 140* 151* 124* 94  BUN 36* 31* 34* 41* 46*  CREATININE 2.45* 1.88* 2.19* 2.63* 2.45*  CALCIUM 8.3* 8.3*  7.9* 8.3* 8.1*  MG 1.2* 2.4  --   --   --    GFR: Estimated Creatinine Clearance: 15.9 mL/min (A) (by C-G formula based on SCr of 2.45 mg/dL (H)). Liver Function Tests: Recent Labs  Lab 03/19/19 2033 03/23/19 0556  AST 32 22  ALT 19 18  ALKPHOS 68 73  BILITOT 0.5 0.5  PROT 8.3* 7.0  ALBUMIN 3.8 2.4*   Recent Labs  Lab 03/19/19 2033 03/23/19 0556  LIPASE 69* 29   No results for input(s): AMMONIA in the last 168 hours. Coagulation Profile: No results for input(s): INR, PROTIME in the last 168 hours. Cardiac Enzymes: No results for input(s): CKTOTAL, CKMB, CKMBINDEX, TROPONINI in the last 168 hours. BNP (last 3 results) No results for input(s): PROBNP in the last 8760 hours. HbA1C: No results for input(s): HGBA1C in the  last 72 hours. CBG: Recent Labs  Lab 03/24/19 1130 03/24/19 1329 03/24/19 1652 03/24/19 2100 03/25/19 0737  GLUCAP 158* 108* 114* 152* 121*   Lipid Profile: No results for input(s): CHOL, HDL, LDLCALC, TRIG, CHOLHDL, LDLDIRECT in the last 72 hours. Thyroid Function Tests: Recent Labs    03/23/19 1057  TSH 1.025   Anemia Panel: Recent Labs    03/23/19 1057  VITAMINB12 2,183*   Urine analysis:    Component Value Date/Time   COLORURINE STRAW (A) 03/19/2019 2329   APPEARANCEUR CLEAR 03/19/2019 2329   LABSPEC 1.005 03/19/2019 2329   PHURINE 6.0 03/19/2019 2329   GLUCOSEU 50 (A) 03/19/2019 2329   HGBUR NEGATIVE 03/19/2019 2329   BILIRUBINUR NEGATIVE 03/19/2019 2329   KETONESUR NEGATIVE 03/19/2019 2329   PROTEINUR NEGATIVE 03/19/2019 2329   UROBILINOGEN 0.2 12/29/2014 1400   NITRITE NEGATIVE 03/19/2019 2329   LEUKOCYTESUR NEGATIVE 03/19/2019 2329   Sepsis Labs: @LABRCNTIP (procalcitonin:4,lacticidven:4)  ) Recent Results (from the past 240 hour(s))  SARS Coronavirus 2 (CEPHEID - Performed in [place] hospital lab), Hosp Order     Status: None   Collection Time: 03/19/19 11:38 PM   Specimen: Nasopharyngeal Swab  Result Value Ref Range Status   SARS Coronavirus 2 NEGATIVE NEGATIVE Final    Comment: (NOTE) If result is NEGATIVE SARS-CoV-2 target nucleic acids are NOT DETECTED. The SARS-CoV-2 RNA is generally detectable in upper and lower  respiratory specimens during the acute phase of infection. The lowest  concentration of SARS-CoV-2 viral copies this assay can detect is 250  copies / mL. A negative result does not preclude SARS-CoV-2 infection  and should not be used as the sole basis for treatment or other  patient management decisions.  A negative result may occur with  improper specimen collection / handling, submission of specimen other  than nasopharyngeal swab, presence of viral mutation(s) within the  areas targeted by this assay, and inadequate number  of viral copies  (<250 copies / mL). A negative result must be combined with clinical  observations, patient history, and epidemiological information. If result is POSITIVE SARS-CoV-2 target nucleic acids are DETECTED. The SARS-CoV-2 RNA is generally detectable in upper and lower  respiratory specimens dur ing the acute phase of infection.  Positive  results are indicative of active infection with SARS-CoV-2.  Clinical  correlation with patient history and other diagnostic information is  necessary to determine patient infection status.  Positive results do  not rule out bacterial infection or co-infection with other viruses. If result is PRESUMPTIVE POSTIVE SARS-CoV-2 nucleic acids MAY BE PRESENT.   A presumptive positive result was obtained on the submitted specimen  and confirmed on repeat testing.  While 2019 novel coronavirus  (SARS-CoV-2) nucleic acids may be present in the submitted sample  additional confirmatory testing may be necessary for epidemiological  and / or clinical management purposes  to differentiate between  SARS-CoV-2 and other Sarbecovirus currently known to infect humans.  If clinically indicated additional testing with an alternate test  methodology [phone]) is advised. The SARS-CoV-2 RNA is generally  detectable in upper and lower respiratory sp ecimens during the acute  phase of infection. The expected result is Negative. Fact Sheet for Patients:  StrictlyIdeas.no Fact Sheet for Healthcare Providers: BankingDealers.co.za This test is not yet approved or cleared by the [place] FDA and has been authorized for detection and/or diagnosis of SARS-CoV-2 by FDA under an Emergency Use Authorization (EUA).  This EUA will remain in effect (meaning this test can be used) for the duration of the COVID-19 declaration under Section 564(b)(1) of the Act, 21 U.S.C. section 360bbb-3(b)(1), unless the authorization is  terminated or revoked sooner. Performed at [facility], [place] [address], [place], [place] [address]   Respiratory Panel by PCR     Status: None   Collection Time: 03/20/19 12:57 AM   Specimen: Nasopharyngeal Swab; Respiratory  Result Value Ref Range Status   Adenovirus NOT DETECTED NOT DETECTED Final   Coronavirus 229E NOT DETECTED NOT DETECTED Final    Comment: (NOTE) The Coronavirus on the Respiratory Panel, DOES NOT test for the novel  Coronavirus (2019 nCoV)    Coronavirus HKU1 NOT DETECTED NOT DETECTED Final   Coronavirus NL63 NOT DETECTED NOT DETECTED Final   Coronavirus OC43 NOT DETECTED NOT DETECTED Final   Metapneumovirus NOT DETECTED NOT DETECTED Final   Rhinovirus / Enterovirus NOT DETECTED NOT DETECTED Final   Influenza A NOT DETECTED NOT DETECTED Final   Influenza B NOT DETECTED NOT DETECTED Final   Parainfluenza Virus 1 NOT DETECTED NOT DETECTED Final   Parainfluenza Virus 2 NOT DETECTED NOT DETECTED Final   Parainfluenza Virus 3 NOT DETECTED NOT DETECTED Final   Parainfluenza Virus 4 NOT DETECTED NOT DETECTED Final   Respiratory Syncytial Virus NOT DETECTED NOT DETECTED Final   Bordetella pertussis NOT DETECTED NOT DETECTED Final   Chlamydophila pneumoniae NOT DETECTED NOT DETECTED Final   Mycoplasma pneumoniae NOT DETECTED NOT DETECTED Final    Comment: Performed at [facility], [place]. [address], [place], [place] [address]  Culture, blood (routine x 2) Call MD if unable to obtain prior to antibiotics being given     Status: None   Collection Time: 03/20/19  2:48 AM   Specimen: BLOOD RIGHT WRIST  Result Value Ref Range Status   Specimen Description BLOOD RIGHT WRIST  Final   Special Requests   Final    BOTTLES DRAWN AEROBIC ONLY Blood Culture results may not be optimal due to an inadequate volume of blood received in culture bottles   Culture   Final    NO GROWTH 5 DAYS Performed at [facility] [address], [place], [place] [address]     Report Status 03/25/2019 FINAL  Final  Culture, blood (routine x 2) Call MD if unable to obtain prior to antibiotics being given     Status: None   Collection Time: 03/20/19  5:26 AM   Specimen: BLOOD LEFT HAND  Result Value Ref Range Status   Specimen Description BLOOD LEFT HAND  Final   Special Requests   Final    BOTTLES DRAWN AEROBIC ONLY  Blood Culture results may not be optimal due to an inadequate volume of blood received in culture bottles   Culture   Final    NO GROWTH 5 DAYS Performed at [facility] [address], [place],  [address]    Report Status 03/25/2019 FINAL  Final      Radiology Studies: US Abdomen Complete  Result Date: 03/23/2019 CLINICAL DATA:  Abdominal pain, acute renal injury EXAM: ABDOMEN ULTRASOUND COMPLETE COMPARISON:  03/19/2019 CT of the abdomen and pelvis FINDINGS: Gallbladder: Well distended with cholelithiasis and gallbladder sludge. No pericholecystic fluid is noted. Negative sonographic Murphy's sign is elicited. Common bile duct: Diameter: 3.9 mm. Liver: No focal lesion identified. Within normal limits in parenchymal echogenicity. Portal vein is patent on color Doppler imaging with normal direction of blood flow towards the liver. IVC: No abnormality visualized. Pancreas: Visualized portion unremarkable. Spleen: Size and appearance within normal limits. Right Kidney: Length: 7.3 cm. 2.5 cm cyst is noted in the midportion of the right kidney stable from the prior CT. Increased echogenicity is noted. Left Kidney: Length: 7.5 cm. No hydronephrosis is noted. Prominent extrarenal pelvis on the left is noted similar to that seen on CT. Increased echogenicity is noted. Abdominal aorta: No aneurysm visualized. Other findings: None. IMPRESSION: Cholelithiasis and gallbladder sludge without complicating factors. Increased echogenicity consistent with medical renal disease. Right renal cyst. Electronically Signed   By: Inez Catalina M.D.   On: 03/23/2019  15:20     Scheduled Meds:  allopurinol  100 mg Oral Daily   amLODipine  10 mg Oral Daily   aspirin  81 mg Oral Daily   atorvastatin  20 mg Oral Daily   azithromycin  500 mg Oral Q24H   diclofenac sodium  2 g Topical QID   enoxaparin (LOVENOX) injection  30 mg Subcutaneous Q24H   gabapentin  200 mg Oral QHS   insulin aspart  0-5 Units Subcutaneous QHS   insulin aspart  0-9 Units Subcutaneous TID WC   insulin aspart  3 Units Subcutaneous TID WC   metoprolol tartrate  25 mg Oral BID   Continuous Infusions:  sodium chloride 75 mL/hr at 03/25/19 0309   cefTRIAXone (ROCEPHIN)  IV 1 g (03/25/19 0310)     LOS: 5 days   Time Spent in minutes   30 minutes   Juwann Sherk D.O. on 03/25/2019 at 9:15 AM  Between 7am to 7pm - Please see pager noted on [email or web address]  After 7pm go to [email or web address]  And look for the night coverage person covering for me after hours  [facility] Office  [phone]

## 2019-03-26 DIAGNOSIS — F4321 Adjustment disorder with depressed mood: Secondary | ICD-10-CM

## 2019-03-26 DIAGNOSIS — R1084 Generalized abdominal pain: Secondary | ICD-10-CM

## 2019-03-26 DIAGNOSIS — R45851 Suicidal ideations: Secondary | ICD-10-CM

## 2019-03-26 DIAGNOSIS — R112 Nausea with vomiting, unspecified: Secondary | ICD-10-CM

## 2019-03-26 DIAGNOSIS — R197 Diarrhea, unspecified: Secondary | ICD-10-CM

## 2019-03-26 LAB — GLUCOSE, CAPILLARY
Glucose-Capillary: 102 mg/dL — ABNORMAL HIGH (ref 70–99)
Glucose-Capillary: 49 mg/dL — ABNORMAL LOW (ref 70–99)
Glucose-Capillary: 56 mg/dL — ABNORMAL LOW (ref 70–99)
Glucose-Capillary: 155 mg/dL — ABNORMAL HIGH (ref 70–99)
Glucose-Capillary: 83 mg/dL (ref 70–99)
Glucose-Capillary: 124 mg/dL — ABNORMAL HIGH (ref 70–99)

## 2019-03-26 SURGERY — COLONOSCOPY
Anesthesia: Monitor Anesthesia Care | Laterality: Left

## 2019-03-26 MED ORDER — DICYCLOMINE HCL 10 MG PO CAPS
10.0000 mg | ORAL_CAPSULE | Freq: Three times a day (TID) | ORAL | Status: DC
  Administered 2019-03-26 (×3): 10 mg via ORAL
  Filled 2019-03-26 (×5): qty 1

## 2019-03-26 MED ORDER — DEXTROSE 50 % IV SOLN
INTRAVENOUS | Status: AC
  Administered 2019-03-26: 50 mL
  Filled 2019-03-26: qty 50

## 2019-03-26 MED ORDER — PANTOPRAZOLE SODIUM 40 MG PO TBEC: 40.0000 mg | DELAYED_RELEASE_TABLET | Freq: Every day | ORAL | Status: DC

## 2019-03-26 MED ORDER — PANTOPRAZOLE SODIUM 40 MG IV SOLR: 40.0000 mg | Freq: Once | INTRAVENOUS | Status: DC

## 2019-03-26 MED ORDER — METOCLOPRAMIDE HCL 5 MG/ML IJ SOLN
5.0000 mg | Freq: Four times a day (QID) | INTRAMUSCULAR | Status: DC
  Administered 2019-03-26 – 2019-03-27 (×4): 5 mg via INTRAVENOUS
  Filled 2019-03-26 (×4): qty 2

## 2019-03-26 MED ORDER — MIRTAZAPINE 15 MG PO TABS
15.0000 mg | ORAL_TABLET | Freq: Every day | ORAL | Status: DC
  Administered 2019-03-26: 15 mg via ORAL
  Filled 2019-03-26: qty 1

## 2019-03-26 MED ORDER — PANTOPRAZOLE SODIUM 40 MG PO TBEC
40.0000 mg | DELAYED_RELEASE_TABLET | Freq: Every day | ORAL | Status: DC
  Administered 2019-03-26 – 2019-03-27 (×2): 40 mg via ORAL
  Filled 2019-03-26 (×3): qty 1

## 2019-03-26 NOTE — Progress Notes (Signed)
Physical Therapy Treatment Patient Details Name: Samantha Mckenzie MRN: [id number] DOB: May 16, 1936 Today's Date: 03/26/2019    History of Present Illness Samantha Mckenzie is an 83 y.o. female with a PMH of stage IV CKD and hypertension who presented to the hospital 03/19/2019 with a chief complaint of abdominal pain associated with nausea, vomiting, and diarrhea.  CT of the abdomen and pelvis showed a right lower lobe pneumonia but no acute intra-abdominal process.  She was also noted to be hypoxic with oxygen saturations in the 70s, which improved with 3 L of oxygen via nasal cannula.  COVID testing was negative.    PT Comments    Pt making good progress with mobility. Pt declines SNF and plans to return home with husband. Expect pt will be able to mobilize adequately around the house at DC.    Follow Up Recommendations  Home health PT;Supervision - Intermittent(vestibular rehab)     Equipment Recommendations  Other (comment)(recommend [facility]. [facility])    Recommendations for Other Services       Precautions / Restrictions Precautions Precautions: Fall Restrictions Weight Bearing Restrictions: No    Mobility  Bed Mobility Overal bed mobility: Modified Independent Bed Mobility: Supine to Sit     Supine to sit: Modified independent (Device/Increase time)        Transfers Overall transfer level: Needs assistance Equipment used: 4-wheeled walker Transfers: Sit to/from Stand Sit to Stand: Supervision         General transfer comment: assist for safety  Ambulation/Gait Ambulation/Gait assistance: Supervision Gait Distance (Feet): 350 Feet Assistive device: 4-wheeled walker Gait Pattern/deviations: Step-through pattern;Decreased stride length   Gait velocity interpretation: >2.62 ft/sec, indicative of community ambulatory General Gait Details: supervision for lines and safety. One standing rest break. Able to [employer] rollator well   Location manager    Modified Rankin (Stroke Patients Only)       Balance Overall balance assessment: Needs assistance Sitting-balance support: No upper extremity supported;Feet supported Sitting balance-Leahy Scale: Fair     Standing balance support: During functional activity;No upper extremity supported Standing balance-Leahy Scale: Fair                              Cognition Arousal/Alertness: Awake/alert Behavior During Therapy: WFL for tasks assessed/performed Overall Cognitive Status: Within Functional Limits for tasks assessed                                        Exercises      General Comments        Pertinent Vitals/Pain      Home Living                      Prior Function            PT Goals (current goals can now be found in the care plan section) Acute Rehab PT Goals Patient Stated Goal: to go home Progress towards PT goals: Progressing toward goals    Frequency    Min 3X/week      PT Plan Discharge plan needs to be updated    Co-evaluation              AM-PAC PT "6 Clicks" Mobility   Outcome Measure  Help needed turning from your back to your side while in a flat bed without using bedrails?: None Help needed moving from lying on your back to sitting on the side of a flat bed without using bedrails?: None Help needed moving to and from a bed to a chair (including a wheelchair)?: A Little Help needed standing up from a chair using your arms (e.g., wheelchair or bedside chair)?: A Little Help needed to walk in hospital room?: A Little Help needed climbing 3-5 steps with a railing? : A Little 6 Click Score: 20    End of Session   Activity Tolerance: Patient tolerated treatment well Patient left: in bed;with call bell/phone within reach(pt reports recliner is very uncomfortable) Nurse Communication: Mobility status PT Visit Diagnosis: Muscle weakness (generalized)  (M62.81);Unsteadiness on feet (R26.81);Dizziness and giddiness (R42);BPPV BPPV - Right/Left : Right     Time: 1007-1219 PT Time Calculation (min) (ACUTE ONLY): 24 min  Charges:  $Gait Training: 23-37 mins                     [place] Pager [phone] Office [place] 03/26/2019, 9:36 AM

## 2019-03-26 NOTE — Progress Notes (Signed)
Inpatient Diabetes Program Recommendations  AACE/ADA: New Consensus Statement on Inpatient Glycemic Control (2015)  Target Ranges:  Prepandial:   less than 140 mg/dL      Peak postprandial:   less than 180 mg/dL (1-2 hours)      Critically ill patients:  140 - 180 mg/dL   Results for Samantha Mckenzie, Samantha "[name]" (MRN [id number]) as of 03/26/2019 12:53  Ref. Range 03/25/2019 07:37 03/25/2019 11:43 03/25/2019 16:18 03/25/2019 20:59  Glucose-Capillary Latest Ref Range: 70 - 99 mg/dL 121 (H)  3 units NOVOLOG  75 111 (H) 101 (H)   Results for Samantha Mckenzie, Samantha "[name]" (MRN [id number]) as of 03/26/2019 12:53  Ref. Range 03/26/2019 07:26 03/26/2019 11:20 03/26/2019 12:10 03/26/2019 12:48  Glucose-Capillary Latest Ref Range: 70 - 99 mg/dL 102 (H)  3 units NOVOLOG  49 (L) 56 (L) 155 (H)     Admit with: Sepsis/ CAP/ N/V/D/ Abd Pain  History: CKD, DM  Home DM Meds: None  Current Orders: Novolog Sensitive Correction Scale/ SSI (0-9 units) TID AC + HS     Novolog 3 units TID with meals     MD- Note patient received 3 units Novolog Meal Coverage this AM--CBG dropped to 49 mg/dl at 12pm today.   Please Discontinue Novolog 3 units TID with meals for now  Recommend continuing Novolog SSI     --Will follow patient during hospitalization--  Wyn Quaker RN, MSN, CDE Diabetes Coordinator Inpatient Glycemic Control Team Team Pager: [phone] (8a-5p)

## 2019-03-26 NOTE — TOC Progression Note (Addendum)
Transition of Care [facility]) - Progression Note    Patient Details  Name: Samantha Mckenzie MRN: [id number] Date of Birth: 05-20-1936  Transition of [facility] CM/SW Contact  Zenon Mayo, RN Phone Number: 03/26/2019, 10:10 AM  Clinical Narrative:    NCM spoke with patient regarding HHPT (vestibular) at home, she had her spouse on the phone and asked the NCM to speak with him about the [facility].  Spouse states he has worked with [facility] in the past and would like [facility], referral made to [facility] with [facility] for HHPT (vestibular), HHRN, HHOT.  Soc will begin 24-48 hrs post dc.  Also she will need a rollator per pt eval.  Spouse states would like to go thru the [place] , NCM contacted the [place] and spoke with Glean Hess and she states they do not do DME for veterans spouses.  NCM contacted Zack with  [employer] for the rollator. NCM spoke with spouse to inform him of this information and he is ok going thru [employer] for the rollator.  Patient still having abd pain and not ready for dc yet.  6/16 Tomi Bamberger RN, BSN- Patient will not need [facility], informed Butch Penny with [facility], she will cancel [facility] and keep HHPT/HHOT.      Expected Discharge Plan: [place] Barriers to Discharge: No Barriers Identified  Expected Discharge Plan and Services Expected Discharge Plan: [place] In-house Referral: NA Discharge Planning Services: CM Consult Post Acute Care Choice: [place] arrangements for the past 2 months: Single Family Home                 DME Arranged: Walker rolling with seat DME Agency: [facility] [facility]) Date DME Agency Contacted: 03/26/19 Time DME Agency Contacted: 1006 Representative spoke with at DME Agency: zack HH Arranged: PT, RN, OT [place] Agency: [place] ([place]) Date [place]: 03/26/19 Time [place]: 1001 Representative spoke with at [place]: [place] ([place]) Interventions     Readmission Risk Interventions No flowsheet data found.

## 2019-03-26 NOTE — Progress Notes (Signed)
Patient refused to finish the [place], stated it made her nauseous. She drunk half of the gallon. She didn't voice any suicide ideation/intention. Will continue to monitor.

## 2019-03-26 NOTE — Progress Notes (Addendum)
Daily Rounding Note  03/26/2019, 8:19 AM  LOS: 6 days   SUBJECTIVE:   Chief complaint:     Nauseated with the Golytely, drank 2 of 4 liters.  Small amount of soft to watery, brown stool yesterday.   Still nauseated, anorexic, and feeling abdominal discomfort and bloating.  Not a lot of flatus.    OBJECTIVE:         Vital signs in last 24 hours:    Temp:  [98.2 F (36.8 C)-99 F (37.2 C)] 98.6 F (37 C) (06/15 0730) Pulse Rate:  [90-94] 94 (06/15 0730) Resp:  [16] 16 (06/15 0730) BP: (147-172)/(63-77) 159/63 (06/15 0730) SpO2:  [93 %-98 %] 98 % (06/15 0730) Last BM Date: 03/25/19 Filed Weights   03/20/19 0700  Weight: 62.2 kg   General: flat affect, looks unwell but not toxic.  uncomfortable   Heart: RRR Chest: clear bil.  No cough or dyspnea Abdomen: soft, ND, minor tenderness mostly on right.  No guard or rebound.  BS present but reduced  Extremities: no CCE Neuro/Psych:  Oriented x 3.  Flat affect.  Follows commands.   No tremors or gross deficits.    Intake/Output from previous day: 06/14 0701 - 06/15 0700 In: 1990 [P.O.:1240; I.V.:750] Out: -   Intake/Output this shift: No intake/output data recorded.  Lab Results: Recent Labs    03/24/19 0827  WBC 10.6*  HGB 10.0*  HCT 30.7*  PLT 198   BMET Recent Labs    03/24/19 0827  NA 140  K 3.7  CL 105  CO2 22  GLUCOSE 94  BUN 46*  CREATININE 2.45*  CALCIUM 8.1*   Scheduled Meds: . allopurinol  100 mg Oral Daily  . amLODipine  10 mg Oral Daily  . aspirin  81 mg Oral Daily  . atorvastatin  20 mg Oral Daily  . azithromycin  500 mg Oral Q24H  . diclofenac sodium  2 g Topical QID  . enoxaparin (LOVENOX) injection  30 mg Subcutaneous Q24H  . gabapentin  200 mg Oral QHS  . insulin aspart  0-5 Units Subcutaneous QHS  . insulin aspart  0-9 Units Subcutaneous TID WC  . insulin aspart  3 Units Subcutaneous TID WC  . metoprolol tartrate  25 mg Oral  BID   Continuous Infusions: . sodium chloride 75 mL/hr (03/25/19 1245)  . cefTRIAXone (ROCEPHIN)  IV 1 g (03/26/19 0322)   PRN Meds:.alum & mag hydroxide-simeth, HYDROmorphone (DILAUDID) injection, ondansetron (ZOFRAN) IV    ASSESMENT:   *   RLQ.  NV/D at admission.  Hx IBS alternating constipation/diarrhea. . Diverticulosis on CTAP. Tics on 2012 colonoscopy.  GB sludge and cholelithiasis on ultrasound. Normal LFTs.  Not a lot of response to the 2 liters of golytely yesterday.   Bloating, discomfort, nausea persist.  Not getting any PPI, takes Omeprazole 20 mg/day at home.      *   CAP RLL.  Azithromycin, ceftriaxone in place.  WBCs improved.     *  Depression  *  AKI, baseline CKD 4.    *   Normocytic anemia.   *   DM 2.      PLAN   *   Added meds: Reglan 5  Mg IV q 6, for 8 doses. Protonix 40 mg po q day.   Bentyl qid.  *   Psych consult.  ? If Remeron might help her anorexia?      [name]  03/26/2019, 8:19 AM Phone [phone]

## 2019-03-26 NOTE — Consult Note (Addendum)
Telepsych Consultation   Reason for Consult:  Concern for SI Referring Physician:  Dr. Cristal Ford Location of Patient: [facility] Location of Provider: [facility]  Patient Identification: Samantha Mckenzie MRN:  [id number] Principal Diagnosis: Adjustment disorder with depressed mood Diagnosis:  Principal Problem:   CAP (community acquired pneumonia) Active Problems:   Diabetes mellitus type 2 with complications ([place])   Hyperlipidemia   GOUT   Normocytic anemia   Essential hypertension   GERD   Hypomagnesemia   Nausea vomiting and diarrhea   Acute respiratory failure with hypoxia (HCC)   CKD (chronic kidney disease), stage IV (HCC)   AKI (acute kidney injury) ([place])   Lactic acidosis   Diabetic peripheral neuropathy associated with type 2 diabetes mellitus ([place])   Dysphasia   Total Time spent with patient: 1 hour  Subjective:   Samantha Mckenzie is a 83 y.o. female patient admitted with sepsis secondary to community acquired pneumonia and acute hypoxic respiratory failure.  HPI:   Per chart review, patient was admitted with sepsis secondary to community acquired pneumonia and acute hypoxic respiratory failure. She is having intractable abdominal pain with nausea and vomiting. Psychiatry was consulted for concern for SI. She reported to primary team that she "does not want to live like this" but denies SI or HI. Remeron 15 mg qhs is scheduled to start tonight. PT recommends SNF placement although patient has decided to go home with [facility].   On interview, [name]" reports that today she is feeling better and is more relaxed. She reports that she took a nap. She reports frustration with her ongoing medical problems. She reports that her stomach pain has been the most bothersome. She reports, "I am 83 years old and I have lived my life." She denies SI and reports that her religion is protective. She denies a history of suicide attempts. She denies HI or AVH. She does report  AVH a couple days ago likely secondary to medication side effects. She was informed about the plan to start Remeron. She is hopeful that it will alleviate some of her GI upset.   Past Psychiatric History: Depression   Risk to Self:  None. Denies SI.  Risk to Others:  None. Denies HI.  Prior Inpatient Therapy:  Denies  Prior Outpatient Therapy:  Previously took Prozac but she did "not like the way it made her feel."   Past Medical History:  Past Medical History:  Diagnosis Date  . Allergic rhinitis   . Anemia   . Breast CA ([place])    surgery, chemo, XRT; had peripheral neuropathy (imbalance at times) after chemo  . Cellulitis    Left arm, recurrent   . Cellulitis LEFT arm recurrent 12/08/2011  . Chronic renal disease, stage IV (HCC)    Dr. Moshe Cipro  . Depression   . Fatigue   . GERD (gastroesophageal reflux disease)    gastritis, EGD 02/2007  . Gout   . Hyperlipidemia   . Hypertension   . Macular degeneration 2011  . Normal cardiac stress test 12/2014  . Osteoarthritis   . Renal insufficiency     Past Surgical History:  Procedure Laterality Date  . cataracts bilaterally  1 -2012  . LUNG BIOPSY  1999   neg  . MASTECTOMY Left 1999   and lymphnodes    Family History:  Family History  Problem Relation Age of Onset  . Breast cancer Other        ? of   . Heart  attack Father 30  . Diabetes Sister        2  . Hypertension Sister   . Colon cancer Neg Hx   . Stomach cancer Neg Hx    Family Psychiatric  History: Denies  Social History:  Social History   Substance and Sexual Activity  Alcohol Use Yes  . Alcohol/week: 0.0 standard drinks   Comment: rarely     Social History   Substance and Sexual Activity  Drug Use No    Social History   Socioeconomic History  . Marital status: Married    Spouse name: Not on file  . Number of children: 2  . Years of education: Not on file  . Highest education level: Not on file  Occupational History  . Occupation: retired      Fish farm manager: RETIRED  Social Needs  . Financial resource strain: Not on file  . Food insecurity    Worry: Not on file    Inability: Not on file  . Transportation needs    Medical: Not on file    Non-medical: Not on file  Tobacco Use  . Smoking status: Never Smoker  . Smokeless tobacco: Never Used  Substance and Sexual Activity  . Alcohol use: Yes    Alcohol/week: 0.0 standard drinks    Comment: rarely  . Drug use: No  . Sexual activity: Not Currently  Lifestyle  . Physical activity    Days per week: Not on file    Minutes per session: Not on file  . Stress: Not on file  Relationships  . Social Herbalist on phone: Not on file    Gets together: Not on file    Attends religious service: Not on file    Active member of club or organization: Not on file    Attends meetings of clubs or organizations: Not on file    Relationship status: Not on file  Other Topics Concern  . Not on file  Social History Narrative   Lives w/ husband , 2 children in [place], 2 [place]           Additional Social History: She lives at home with her husband. She has 2 adult sons. She denies illicit substance use. She reports social alcohol use.     Allergies:   Allergies  Allergen Reactions  . Codeine Nausea And Vomiting  . Morphine Nausea And Vomiting  . Oxycodone-Acetaminophen Nausea And Vomiting  . Sulfonamide Derivatives Nausea And Vomiting  . Tramadol Hcl Nausea And Vomiting    Labs:  Results for orders placed or performed during the hospital encounter of 03/19/19 (from the past 48 hour(s))  Glucose, capillary     Status: Abnormal   Collection Time: 03/24/19  1:29 PM  Result Value Ref Range   Glucose-Capillary 108 (H) 70 - 99 mg/dL  Glucose, capillary     Status: Abnormal   Collection Time: 03/24/19  4:52 PM  Result Value Ref Range   Glucose-Capillary 114 (H) 70 - 99 mg/dL  Glucose, capillary     Status: Abnormal   Collection Time: 03/24/19  9:00 PM  Result Value Ref Range    Glucose-Capillary 152 (H) 70 - 99 mg/dL  Glucose, capillary     Status: Abnormal   Collection Time: 03/25/19  7:37 AM  Result Value Ref Range   Glucose-Capillary 121 (H) 70 - 99 mg/dL  Glucose, capillary     Status: None   Collection Time: 03/25/19 11:43 AM  Result Value Ref  Range   Glucose-Capillary 75 70 - 99 mg/dL  Glucose, capillary     Status: Abnormal   Collection Time: 03/25/19  4:18 PM  Result Value Ref Range   Glucose-Capillary 111 (H) 70 - 99 mg/dL  Glucose, capillary     Status: Abnormal   Collection Time: 03/25/19  8:59 PM  Result Value Ref Range   Glucose-Capillary 101 (H) 70 - 99 mg/dL  Glucose, capillary     Status: Abnormal   Collection Time: 03/26/19  7:26 AM  Result Value Ref Range   Glucose-Capillary 102 (H) 70 - 99 mg/dL   Comment 1 QC Due   Glucose, capillary     Status: Abnormal   Collection Time: 03/26/19 11:20 AM  Result Value Ref Range   Glucose-Capillary 49 (L) 70 - 99 mg/dL    Medications:  Current Facility-Administered Medications  Medication Dose Route Frequency Provider Last Rate Last Dose  . 0.9 %  sodium chloride infusion   Intravenous Continuous Etta Quill, DO 75 mL/hr at 03/26/19 0835    . allopurinol (ZYLOPRIM) tablet 100 mg  100 mg Oral Daily Jennette Kettle M, DO   100 mg at 03/26/19 1029  . alum & mag hydroxide-simeth (MAALOX/MYLANTA) 200-200-20 MG/5ML suspension 15 mL  15 mL Oral Q4H PRN Rama, Venetia Maxon, MD   15 mL at 03/24/19 2355  . amLODipine (NORVASC) tablet 10 mg  10 mg Oral Daily Jennette Kettle M, DO   10 mg at 03/26/19 1030  . aspirin chewable tablet 81 mg  81 mg Oral Daily Rama, Venetia Maxon, MD   81 mg at 03/26/19 1030  . atorvastatin (LIPITOR) tablet 20 mg  20 mg Oral Daily Jennette Kettle M, DO   20 mg at 03/26/19 1030  . azithromycin (ZITHROMAX) tablet 500 mg  500 mg Oral Q24H Jennette Kettle M, DO   500 mg at 03/26/19 0322  . cefTRIAXone (ROCEPHIN) 1 g in sodium chloride 0.9 % 100 mL IVPB  1 g Intravenous Q24H Jennette Kettle M, DO 200 mL/hr at 03/26/19 0322 1 g at 03/26/19 0322  . diclofenac sodium (VOLTAREN) 1 % transdermal gel 2 g  2 g Topical QID Cristal Ford, DO   2 g at 03/26/19 1033  . dicyclomine (BENTYL) capsule 10 mg  10 mg Oral TID AC & HS Gribbin, Charlynne Cousins, PA-C      . enoxaparin (LOVENOX) injection 30 mg  30 mg Subcutaneous Q24H Jennette Kettle M, DO   30 mg at 03/26/19 6010  . gabapentin (NEURONTIN) capsule 200 mg  200 mg Oral QHS Jennette Kettle M, DO   200 mg at 03/25/19 2107  . HYDROmorphone (DILAUDID) injection 0.5-1 mg  0.5-1 mg Intravenous Q4H PRN Etta Quill, DO   1 mg at 03/23/19 0108  . insulin aspart (novoLOG) injection 0-5 Units  0-5 Units Subcutaneous QHS Rama, Christina P, MD      . insulin aspart (novoLOG) injection 0-9 Units  0-9 Units Subcutaneous TID WC Rama, Venetia Maxon, MD   1 Units at 03/22/19 0917  . insulin aspart (novoLOG) injection 3 Units  3 Units Subcutaneous TID WC Rama, Venetia Maxon, MD   3 Units at 03/26/19 [phone]  . metoCLOPramide (REGLAN) injection 5 mg  5 mg Intravenous Q6H Gribbin, Sarah J, PA-C      . metoprolol tartrate (LOPRESSOR) tablet 25 mg  25 mg Oral BID Jennette Kettle M, DO   25 mg at 03/26/19 1030  . mirtazapine (REMERON) tablet 15 mg  15 mg Oral QHS Mikhail, [place], DO      . ondansetron [facility]) injection 4 mg  4 mg Intravenous Q6H PRN Etta Quill, DO   4 mg at 03/26/19 1104  . pantoprazole (PROTONIX) EC tablet 40 mg  40 mg Oral Daily Cristal Ford, DO   40 mg at 03/26/19 1029    Musculoskeletal: Strength & Muscle Tone: No atrophy noted. Gait & Station: UTA since patient is lying in bed. Patient leans: N/A  Psychiatric Specialty Exam: Physical Exam  Nursing note and vitals reviewed. Constitutional: She is oriented to person, place, and time. She appears well-developed and well-nourished.  HENT:  Head: Normocephalic and atraumatic.  Neck: Normal range of motion.  Respiratory: Effort normal.  Musculoskeletal: Normal range of motion.   Neurological: She is alert and oriented to person, place, and time.  Psychiatric: She has a normal mood and affect. Her behavior is normal. Judgment and thought content normal.    Review of Systems  Gastrointestinal: Positive for abdominal pain. Negative for constipation, diarrhea, nausea and vomiting.  Psychiatric/Behavioral: Positive for depression. Negative for hallucinations, substance abuse and suicidal ideas. The patient does not have insomnia.     Blood pressure (!) 159/63, pulse 94, temperature 98.6 F (37 C), temperature source Oral, resp. rate 16, height 5' 2"  (1.575 m), weight 62.2 kg, SpO2 98 %.Body mass index is 25.08 kg/m.  General Appearance: Fairly Groomed, elderly, Hispanic female, wearing a hospital gown who is lying in bed. NAD.   Eye Contact:  Good  Speech:  Clear and Coherent and Normal Rate  Volume:  Normal  Mood:  Anxious and Depressed  Affect:  Congruent  Thought Process:  Goal Directed, Linear and Descriptions of Associations: Intact  Orientation:  Full (Time, Place, and Person)  Thought Content:  Logical  Suicidal Thoughts:  No  Homicidal Thoughts:  No  Memory:  Immediate;   Good Recent;   Good Remote;   Good  Judgement:  Fair  Insight:  Fair  Psychomotor Activity:  Normal  Concentration:  Concentration: Good and Attention Span: Good  Recall:  Good  Fund of Knowledge:  Good  Language:  Good  Akathisia:  No  Handed:  Right  AIMS (if indicated):   N/A  Assets:  Communication Skills Desire for Improvement Housing Intimacy Resilience Social Support  ADL's:  Intact  Cognition:  WNL  Sleep:   Okay   Assessment:  AKEYA RYTHER is a 83 y.o. female who was admitted with sepsis secondary to community acquired pneumonia and acute hypoxic respiratory failure. Patient reports frustration with her ongoing medical problems. She denies SI, HI or AVH. She denies a history of suicide attempts. Agree with Remeron for mood symptoms and GI upset.   Treatment  Plan Summary: -Continue Remeron 15 mg qhs for mood and may be beneficial for GI upset.  -EKG reviewed and QTc 462 on 6/8. Please closely monitor when starting or increasing QTc prolonging agents.  -Psychiatry will sign off on patient at this time. Please consult psychiatry again as needed.   Disposition: No evidence of imminent risk to self or others at present.   Patient does not meet criteria for psychiatric inpatient admission.  This service was provided via telemedicine using a 2-way, interactive audio and video technology.  Names of all persons participating in this telemedicine service and their role in this encounter. Name: Buford Dresser, DO Role: Psychiatrist  Name: Olevia Perches Role: Patient    Faythe Dingwall, DO 03/26/2019 12:11 PM

## 2019-03-26 NOTE — Progress Notes (Signed)
PROGRESS NOTE    Samantha Mckenzie  VHQ:[id number] DOB: December 08, 1935 DOA: 03/19/2019 PCP: Colon Branch, MD   Brief Narrative:  HPI On 03/20/2019 by Dr. Jennette Kettle TAYLORE Samantha Mckenzie is a 83 y.o. female with medical history significant of CKD stage 4, HTN.  Patient presents to the ED with c/o abd pain, N/V/D.  Symptoms onset suddenly about 1.5h PTA in the ED, persistent, nothing makes better or worse.  Symptoms are severe.  Abd pain is generalized.  Interim history Admitted with sepsis secondary to pneumonia and acute hypoxic respiratory failure.  Also having intractable abdominal pain with unknown cause at this time.  Assessment & Plan   Sepsis secondary to bar community-acquired pneumonia/acute hypoxic respiratory failure -on admission, patient with leukocytosis, tachypnea -Chest x-ray showed cardiomegaly with mild volume overload.  Trace bilateral pleural effusions. -CT abdomen/pelvis did show findings concerning for right lower lobe pneumonia -COVID, respiratory viral panel negative -Blood cultures showed no growth to date -Continue azithromycin and ceftriaxone -Patient was noted to have oxygen saturations in the 70s when she presented to the emergency department, needing 2 L of nasal cannula. -Currently on room air and maintaining O2 saturations in the 90s -question of dysphagia, which places patient at increased risk of aspiration- speech therapy consulted- recommended regular diet with thin liquids  Nausea, vomiting, diarrhea and abdominal pain -Patient denies any recent vomiting or diarrhea but continues to have abdominal pain -CT abdomen and pelvis: Sigmoid diverticulosis without CT evidence of diverticulitis. -?  Musculoskeletal secondary to coughing from pneumonia and recent vomiting- also states that the pain worsens with movement -Continue Voltaren gel and K pad -LFTs and lipase WNL -Continue pain control and antiemetics -Abdominal US: cholelithiasis and gallbladder sludge  without complicating factors -Gastroenterology consulted and appreciated. Was given NuLYTELY to help alleviate RLQ pain.  -Patient was only able to tolerate half of the [place] as she developed nausea with vomiting. She did have 2 bowel movements. -Pain seems to be mildly better today.  Acute kidney injury chronic kidney disease, stage IV -Resolved with IV fluids  -Creatinine on admission 2.79 (Baseline creatinine approximate 1.7-2.4) -Creatinine currently 2.45 -Abd [place]: increased echogenicity consistent with medical renal disease. R renal cyst -Continue to monitor BMP  Upper extremity tremors -patient complains of UE tremors when her hands get cold only and that she cannot pick up a cup of coffee or even the phone -on examination, no tremors noted when UE are fully extended  -?due to weakness and recent poor oral intake as well as infection -discussed with neurology, Dr. Leonel Ramsay, referred to this as possible Enhanced physiological tremors. No treatment, will get better with time. -obtained Vit D 25-hydroxy 61.5, Vitamin B12 2,183, TSH 1.025    -Improved  Essential hypertension -Blood pressure stable, continue amlodipine, metoprolol  Hypokalemia/hypomagnesemia -Resolved with replacement.   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   -She states she wants to feel  and get better. She has no intentions of hurting herself or anyone else. She states she did not ask the nurse to "help her" in killing herself.  -Patient appears to be situational depressed. She does not appear to be suicidal.  -psychiatry consulted  -patient may benefit from remeron for depression and appetite- however these effects do not occur instantaneously    DVT Prophylaxis  lovenox  Code Status: Full  Family Communication: none at bedside  Disposition Plan: Admitted. Dispo home with home health when abd pain improves  Consultants Neurology, Dr. Leonel Ramsay, via phone Gastroenterology  Psychiatry  Procedures  Abd [place]  Antibiotics   Anti-infectives (From admission, onward)   Start     Dose/Rate Route Frequency Ordered Stop   03/21/19 0400  azithromycin (ZITHROMAX) tablet 500 mg     500 mg Oral Every 24 hours 03/20/19 0057 03/28/19 0359   03/21/19 0300  cefTRIAXone (ROCEPHIN) 1 g in sodium chloride 0.9 % 100 mL IVPB     1 g 200 mL/hr over 30 Minutes Intravenous Every 24 hours 03/20/19 0057 03/28/19 0259   03/20/19 0015  cefTRIAXone (ROCEPHIN) 1 g in sodium chloride 0.9 % 100 mL IVPB     1 g 200 mL/hr over 30 Minutes Intravenous  Once 03/20/19 0001 03/20/19 0359   03/20/19 0015  azithromycin (ZITHROMAX) 500 mg in sodium chloride 0.9 % 250 mL IVPB     500 mg 250 mL/hr over 60 Minutes Intravenous  Once 03/20/19 0001 03/20/19 0535      Subjective:   Samantha Mckenzie seen and examined today.  Feels breathing has improved. Continues to have abdominal pain, and had several episodes of vomiting overnight. Wants to drink water today. Denies current chest pain, dizziness, headache. Denies suicidal or homicidal thoughts. Wants to get better, but does not want to live "like this, in pain."  Objective:   Vitals:   03/25/19 0834 03/25/19 1620 03/25/19 2300 03/26/19 0730  BP: (!) 147/77 (!) 172/63 (!) 168/64 (!) 159/63  Pulse: 90 91 91 94  Resp:    16   Temp: 98.2 F (36.8 C) 99 F (37.2 C) 98.9 F (37.2 C) 98.6 F (37 C)  TempSrc: Oral Oral Oral Oral  SpO2: 93% 93% 93% 98%  Weight:      Height:        Intake/Output Summary (Last 24 hours) at 03/26/2019 0853 Last data filed at 03/25/2019 1600 Gross per 24 hour  Intake 1750 ml  Output -  Net 1750 ml   Filed Weights   03/20/19 0700  Weight: 62.2 kg   Exam  General: Well developed, well nourished, NAD, appears stated age  73: NCAT, mucous membranes moist.   Cardiovascular: S1 S2 auscultated, no murmur, RRR  Respiratory: Clear to auscultation bilaterally with equal chest rise, no wheezing  Abdomen: Soft, mildly TTP Right mid/lower quadrant, nondistended, +bowel sounds  Extremities: warm dry without cyanosis clubbing or edema  Neuro: AAOx3, nonfocal  Skin: Without rashes exudates or nodules  Psych: depressed  Data Reviewed: I have personally reviewed following labs and imaging studies  CBC: Recent Labs  Lab 03/19/19 2033 03/20/19 0526 03/21/19 0533 03/22/19 0821 03/23/19 0556 03/24/19 0827  WBC 8.9 18.0* 18.5* 15.4* 16.6* 10.6*  NEUTROABS 7.5  --   --   --   --   --   HGB 10.7* 11.0* 10.8* 10.7* 10.8* 10.0*  HCT 32.3* 33.6* 32.4* 32.0* 32.3* 30.7*  MCV 92.3 94.9 92.3 91.7 92.3 92.7  PLT 214 187 183  180 196 756   Basic Metabolic Panel: Recent Labs  Lab 03/20/19 0526 03/21/19 0533 03/22/19 0821 03/23/19 0556 03/24/19 0827  NA 139 141 135 138 140  K 3.2* 3.7 3.4* 3.9 3.7  CL 100 102 97* 101 105  CO2 23 25 27 25 22   GLUCOSE 243* 140* 151* 124* 94  BUN 36* 31* 34* 41* 46*  CREATININE 2.45* 1.88* 2.19* 2.63* 2.45*  CALCIUM 8.3* 8.3* 7.9* 8.3* 8.1*  MG 1.2* 2.4  --   --   --    GFR: Estimated Creatinine Clearance: 15.9 mL/min (A) (by C-G formula based on SCr of 2.45 mg/dL (H)). Liver Function Tests: Recent Labs  Lab 03/19/19 2033 03/23/19 0556  AST 32 22  ALT 19 18  ALKPHOS 68 73  BILITOT 0.5 0.5  PROT 8.3* 7.0  ALBUMIN 3.8 2.4*    Recent Labs  Lab 03/19/19 2033 03/23/19 0556  LIPASE 69* 29   No results for input(s): AMMONIA in the last 168 hours. Coagulation Profile: No results for input(s): INR, PROTIME in the last 168 hours. Cardiac Enzymes: No results for input(s): CKTOTAL, CKMB, CKMBINDEX, TROPONINI in the last 168 hours. BNP (last 3 results) No results for input(s): PROBNP in the last 8760 hours. HbA1C: No results for input(s): HGBA1C in the last 72 hours. CBG: Recent Labs  Lab 03/24/19 2100 03/25/19 0737 03/25/19 1143 03/25/19 1618 03/25/19 2059  GLUCAP 152* 121* 75 111* 101*   Lipid Profile: No results for input(s): CHOL, HDL, LDLCALC, TRIG, CHOLHDL, LDLDIRECT in the last 72 hours. Thyroid Function Tests: Recent Labs    03/23/19 1057  TSH 1.025   Anemia Panel: Recent Labs    03/23/19 1057  VITAMINB12 2,183*   Urine analysis:    Component Value Date/Time   COLORURINE STRAW (A) 03/19/2019 2329   APPEARANCEUR CLEAR 03/19/2019 2329   LABSPEC 1.005 03/19/2019 2329   PHURINE 6.0 03/19/2019 2329   GLUCOSEU 50 (A) 03/19/2019 2329   HGBUR NEGATIVE 03/19/2019 2329   BILIRUBINUR NEGATIVE 03/19/2019 2329   KETONESUR NEGATIVE 03/19/2019 2329   PROTEINUR NEGATIVE 03/19/2019 2329   UROBILINOGEN 0.2 12/29/2014 1400   NITRITE NEGATIVE 03/19/2019 2329   LEUKOCYTESUR NEGATIVE 03/19/2019 2329   Sepsis Labs: @LABRCNTIP (procalcitonin:4,lacticidven:4)  ) Recent Results (from the past 240 hour(s))  SARS Coronavirus 2 (CEPHEID - Performed in [place] hospital lab), Hosp Order     Status: None   Collection Time: 03/19/19 11:38 PM   Specimen: Nasopharyngeal Swab  Result Value Ref Range Status   SARS Coronavirus 2 NEGATIVE NEGATIVE Final    Comment: (NOTE) If result is NEGATIVE SARS-CoV-2 target nucleic acids are NOT DETECTED. The SARS-CoV-2 RNA is generally detectable in upper and lower  respiratory specimens during the acute phase of infection. The lowest  concentration of SARS-CoV-2  viral copies this assay can detect is 250  copies / mL. A negative result does not preclude SARS-CoV-2 infection  and should not be used as the sole basis for treatment or other  patient management decisions.  A negative result may occur with  improper specimen collection / handling, submission of specimen other  than nasopharyngeal swab, presence of viral mutation(s) within the  areas targeted by this assay, and inadequate number of viral copies  (<250 copies / mL). A negative result must be combined with clinical  observations, patient history, and epidemiological information. If result is POSITIVE SARS-CoV-2 target nucleic acids are DETECTED. The SARS-CoV-2 RNA is generally detectable in upper and lower  respiratory specimens dur  ing the acute phase of infection.  Positive  results are indicative of active infection with SARS-CoV-2.  Clinical  correlation with patient history and other diagnostic information is  necessary to determine patient infection status.  Positive results do  not rule out bacterial infection or co-infection with other viruses. If result is PRESUMPTIVE POSTIVE SARS-CoV-2 nucleic acids MAY BE PRESENT.   A presumptive positive result was obtained on the submitted specimen  and confirmed on repeat testing.  While 2019 novel coronavirus  (SARS-CoV-2) nucleic acids may be present in the submitted sample  additional confirmatory testing may be necessary for epidemiological  and / or clinical management purposes  to differentiate between  SARS-CoV-2 and other Sarbecovirus currently known to infect humans.  If clinically indicated additional testing with an alternate test  methodology [phone]) is advised. The SARS-CoV-2 RNA is generally  detectable in upper and lower respiratory sp ecimens during the acute  phase of infection. The expected result is Negative. Fact Sheet for Patients:  StrictlyIdeas.no Fact Sheet for Healthcare Providers:  BankingDealers.co.za This test is not yet approved or cleared by the [place] FDA and has been authorized for detection and/or diagnosis of SARS-CoV-2 by FDA under an Emergency Use Authorization (EUA).  This EUA will remain in effect (meaning this test can be used) for the duration of the COVID-19 declaration under Section 564(b)(1) of the Act, 21 U.S.C. section 360bbb-3(b)(1), unless the authorization is terminated or revoked sooner. Performed at [facility], [place] [address] [place], [place] [address]   Respiratory Panel by PCR     Status: None   Collection Time: 03/20/19 12:57 AM   Specimen: Nasopharyngeal Swab; Respiratory  Result Value Ref Range Status   Adenovirus NOT DETECTED NOT DETECTED Final   Coronavirus 229E NOT DETECTED NOT DETECTED Final    Comment: (NOTE) The Coronavirus on the Respiratory Panel, DOES NOT test for the novel  Coronavirus (2019 nCoV)    Coronavirus HKU1 NOT DETECTED NOT DETECTED Final   Coronavirus NL63 NOT DETECTED NOT DETECTED Final   Coronavirus OC43 NOT DETECTED NOT DETECTED Final   Metapneumovirus NOT DETECTED NOT DETECTED Final   Rhinovirus / Enterovirus NOT DETECTED NOT DETECTED Final   Influenza A NOT DETECTED NOT DETECTED Final   Influenza B NOT DETECTED NOT DETECTED Final   Parainfluenza Virus 1 NOT DETECTED NOT DETECTED Final   Parainfluenza Virus 2 NOT DETECTED NOT DETECTED Final   Parainfluenza Virus 3 NOT DETECTED NOT DETECTED Final   Parainfluenza Virus 4 NOT DETECTED NOT DETECTED Final   Respiratory Syncytial Virus NOT DETECTED NOT DETECTED Final   Bordetella pertussis NOT DETECTED NOT DETECTED Final   Chlamydophila pneumoniae NOT DETECTED NOT DETECTED Final   Mycoplasma pneumoniae NOT DETECTED NOT DETECTED Final    Comment: Performed at [facility] : [facility], [place]. [address], [place], [place] [address]  Culture, blood (routine x 2) Call MD if unable to obtain prior to antibiotics being given     Status:  None   Collection Time: 03/20/19  2:48 AM   Specimen: BLOOD RIGHT WRIST  Result Value Ref Range Status   Specimen Description BLOOD RIGHT WRIST  Final   Special Requests   Final    BOTTLES DRAWN AEROBIC ONLY Blood Culture results may not be optimal due to an inadequate volume of blood received in culture bottles   Culture   Final    NO GROWTH 5 DAYS Performed at [facility] [address], [place],  [address]  Report Status 03/25/2019 FINAL  Final  Culture, blood (routine x 2) Call MD if unable to obtain prior to antibiotics being given     Status: None   Collection Time: 03/20/19  5:26 AM   Specimen: BLOOD LEFT HAND  Result Value Ref Range Status   Specimen Description BLOOD LEFT HAND  Final   Special Requests   Final    BOTTLES DRAWN AEROBIC ONLY Blood Culture results may not be optimal due to an inadequate volume of blood received in culture bottles   Culture   Final    NO GROWTH 5 DAYS Performed at [facility], [place] [address], [place], [place] [address]    Report Status 03/25/2019 FINAL  Final      Radiology Studies: No results found.   Scheduled Meds: . allopurinol  100 mg Oral Daily  . amLODipine  10 mg Oral Daily  . aspirin  81 mg Oral Daily  . atorvastatin  20 mg Oral Daily  . azithromycin  500 mg Oral Q24H  . diclofenac sodium  2 g Topical QID  . enoxaparin (LOVENOX) injection  30 mg Subcutaneous Q24H  . gabapentin  200 mg Oral QHS  . insulin aspart  0-5 Units Subcutaneous QHS  . insulin aspart  0-9 Units Subcutaneous TID WC  . insulin aspart  3 Units Subcutaneous TID WC  . metoprolol tartrate  25 mg Oral BID   Continuous Infusions: . sodium chloride 75 mL/hr at 03/26/19 0835  . cefTRIAXone (ROCEPHIN)  IV 1 g (03/26/19 0322)     LOS: 6 days   Time Spent in minutes   45 minutes   Jissel Slavens D.O. on 03/26/2019 at 8:53 AM  Between 7am to 7pm - Please see pager noted on [email or web address]  After 7pm go to [email or web address]  And look for  the night coverage person covering for me after hours  [facility] Office  [phone]

## 2019-03-27 DIAGNOSIS — F4321 Adjustment disorder with depressed mood: Secondary | ICD-10-CM

## 2019-03-27 DIAGNOSIS — K219 Gastro-esophageal reflux disease without esophagitis: Secondary | ICD-10-CM

## 2019-03-27 LAB — BASIC METABOLIC PANEL
Anion gap: 9 (ref 5–15)
BUN: 35 mg/dL — ABNORMAL HIGH (ref 8–23)
CO2: 24 mmol/L (ref 22–32)
Calcium: 7.7 mg/dL — ABNORMAL LOW (ref 8.9–10.3)
Chloride: 108 mmol/L (ref 98–111)
Creatinine, Ser: 2.17 mg/dL — ABNORMAL HIGH (ref 0.44–1.00)
GFR calc Af Amer: 24 mL/min — ABNORMAL LOW (ref >60)
GFR calc non Af Amer: 21 mL/min — ABNORMAL LOW (ref >60)
Glucose, Bld: 91 mg/dL (ref 70–99)
Potassium: 3.1 mmol/L — ABNORMAL LOW (ref 3.5–5.1)
Sodium: 141 mmol/L (ref 135–145)

## 2019-03-27 LAB — GLUCOSE, CAPILLARY: Glucose-Capillary: 89 mg/dL (ref 70–99)

## 2019-03-27 MED ORDER — DICYCLOMINE HCL 10 MG PO CAPS: 20.0000 mg | ORAL_CAPSULE | Freq: Three times a day (TID) | ORAL | 1 refills | Status: DC

## 2019-03-27 MED ORDER — ONDANSETRON HCL 4 MG PO TABS: 4.0000 mg | ORAL_TABLET | Freq: Three times a day (TID) | ORAL | 0 refills | Status: DC

## 2019-03-27 MED ORDER — MIRTAZAPINE 15 MG PO TABS: 15.0000 mg | ORAL_TABLET | Freq: Every day | ORAL | 1 refills | Status: DC

## 2019-03-27 MED ORDER — ONDANSETRON HCL 4 MG PO TABS
4.0000 mg | ORAL_TABLET | Freq: Three times a day (TID) | ORAL | Status: DC
  Administered 2019-03-27: 4 mg via ORAL
  Filled 2019-03-27: qty 1

## 2019-03-27 MED ORDER — PANTOPRAZOLE SODIUM 40 MG PO TBEC: 40.0000 mg | DELAYED_RELEASE_TABLET | Freq: Every day | ORAL | 0 refills | Status: DC

## 2019-03-27 MED ORDER — ONDANSETRON 4 MG PO TBDP: 4.0000 mg | ORAL_TABLET | Freq: Three times a day (TID) | ORAL | 0 refills | Status: DC | PRN

## 2019-03-27 MED ORDER — DICYCLOMINE HCL 10 MG PO CAPS
20.0000 mg | ORAL_CAPSULE | Freq: Three times a day (TID) | ORAL | Status: DC
  Filled 2019-03-27: qty 2

## 2019-03-27 NOTE — Progress Notes (Signed)
Patient discharge home on home health, discharge instruction given to patient, education on Bentyl, Remeron. Protonix  And Zofran given to patient. Patient verbalized understanding.Patient has  no further questions at this time. Pt son is here to pick her up.

## 2019-03-27 NOTE — TOC Transition Note (Signed)
Transition of Care [facility]) - CM/SW Discharge Note   Patient Details  Name: Samantha Mckenzie MRN: [id number] Date of Birth: 10-22-1935  [facility] CM/SW Contact:  Zenon Mayo, RN Phone Number: 03/27/2019, 10:02 AM   Clinical Narrative:    Patient for dc today home with HHPT/HHOT, Butch Penny notified with [facility], orders in , also notified Zack with [employer] for rollator.     Final next level of care: [place] Barriers to Discharge: No Barriers Identified   Patient Goals and CMS Choice Patient states their goals for this hospitalization and ongoing recovery are:: get better CMS Medicare.gov Compare Post Acute Care list provided to:: Patient Choice offered to / list presented to : Spouse, Patient  Discharge Placement                       Discharge Plan and Services In-house Referral: NA Discharge Planning Services: CM Consult Post Acute Care Choice: Home Health          DME Arranged: Walker rolling with seat DME Agency: [employer] Date DME Agency Contacted: 03/26/19 Time DME Agency Contacted: 74 Representative spoke with at DME Agency: zack HH Arranged: PT, OT [place] Agency: [place] ([place]) Date [place]: 03/26/19 Time [place]: [id number] Representative spoke with at [place]: [place] ([place]) Interventions     Readmission Risk Interventions Readmission Risk Prevention Plan 03/26/2019  Transportation Screening Complete  PCP or Specialist Appt within 3-5 Days Complete  HRI or [place] Complete  Social Work Consult for [place] Planning/Counseling Complete  Palliative Care Screening Not Applicable  Medication Review Press photographer) Complete  Some recent data might be hidden

## 2019-03-27 NOTE — Discharge Instructions (Signed)
Community-Acquired Pneumonia, Adult Pneumonia is an infection of the lungs. It causes swelling in the airways of the lungs. Mucus and fluid may also build up inside the airways. One type of pneumonia can happen while a person is in a hospital. A different type can happen when a person is not in a hospital (community-acquired pneumonia).  What are the causes?  This condition is caused by germs (viruses, bacteria, or fungi). Some types of germs can be passed from one person to another. This can happen when you breathe in droplets from the cough or sneeze of an infected person. What increases the risk? You are more likely to develop this condition if you:  Have a long-term (chronic) disease, such as: ? Chronic obstructive pulmonary disease (COPD). ? Asthma. ? Cystic fibrosis. ? Congestive heart failure. ? Diabetes. ? Kidney disease.  Have HIV.  Have sickle cell disease.  Have had your spleen removed.  Do not take good care of your teeth and mouth (poor dental hygiene).  Have a medical condition that increases the risk of breathing in droplets from your own mouth and nose.  Have a weakened body defense system (immune system).  Are a smoker.  Travel to areas where the germs that cause this illness are common.  Are around certain animals or the places they live. What are the signs or symptoms?  A dry cough.  A wet (productive) cough.  Fever.  Sweating.  Chest pain. This often happens when breathing deeply or coughing.  Fast breathing or trouble breathing.  Shortness of breath.  Shaking chills.  Feeling tired (fatigue).  Muscle aches. How is this treated? Treatment for this condition depends on many things. Most adults can be treated at home. In some cases, treatment must happen in a hospital. Treatment may include:  Medicines given by mouth or through an IV tube.  Being given extra oxygen.  Respiratory therapy. In rare cases, treatment for very bad pneumonia  may include:  Using a machine to help you breathe.  Having a procedure to remove fluid from around your lungs. Follow these instructions at home: Medicines  Take over-the-counter and prescription medicines only as told by your doctor. ? Only take cough medicine if you are losing sleep.  If you were prescribed an antibiotic medicine, take it as told by your doctor. Do not stop taking the antibiotic even if you start to feel better. General instructions   Sleep with your head and neck raised (elevated). You can do this by sleeping in a recliner or by putting a few pillows under your head.  Rest as needed. Get at least 8 hours of sleep each night.  Drink enough water to keep your pee (urine) pale yellow.  Eat a healthy diet that includes plenty of vegetables, fruits, whole grains, low-fat dairy products, and lean protein.  Do not use any products that contain nicotine or tobacco. These include cigarettes, e-cigarettes, and chewing tobacco. If you need help quitting, ask your doctor.  Keep all follow-up visits as told by your doctor. This is important. How is this prevented? A shot (vaccine) can help prevent pneumonia. Shots are often suggested for:  People older than 81 years of age.  People older than 81 years of age who: ? Are having cancer treatment. ? Have long-term (chronic) lung disease. ? Have problems with their body's defense system. You may also prevent pneumonia if you take these actions:  Get the flu (influenza) shot every year.  Go to the dentist as  often as told.  Wash your hands often. If you cannot use soap and water, use hand sanitizer. Contact a doctor if:  You have a fever.  You lose sleep because your cough medicine does not help. Get help right away if:  You are short of breath and it gets worse.  You have more chest pain.  Your sickness gets worse. This is very serious if: ? You are an older adult. ? Your body's defense system is weak.  You  cough up blood. Summary  Pneumonia is an infection of the lungs.  Most adults can be treated at home. Some will need treatment in a hospital.  Drink enough water to keep your pee pale yellow.  Get at least 8 hours of sleep each night. This information is not intended to replace advice given to you by your health care provider. Make sure you discuss any questions you have with your health care provider. Document Released: 03/15/2008 Document Revised: 05/25/2018 Document Reviewed: 05/25/2018 [employer] Interactive Patient Education  2019 [employer]   Irritable Bowel Syndrome, Adult  Irritable bowel syndrome (IBS) is a group of symptoms that affects the organs responsible for digestion (gastrointestinal or GI tract). IBS is not one specific disease. To regulate how the GI tract works, the body sends signals back and forth between the intestines and the brain. If you have IBS, there may be a problem with these signals. As a result, the GI tract does not function normally. The intestines may become more sensitive and overreact to certain things. This may be especially true when you eat certain foods or when you are under stress. There are four types of IBS. These may be determined based on the consistency of your stool (feces):  IBS with diarrhea.  IBS with constipation.  Mixed IBS.  Unsubtyped IBS. It is important to know which type of IBS you have. Certain treatments are more likely to be helpful for certain types of IBS. What are the causes? The exact cause of IBS is not known. What increases the risk? You may have a higher risk for IBS if you:  Are female.  Are younger than 64.  Have a family history of IBS.  Have a mental health condition, such as depression, anxiety, or post-traumatic stress disorder.  Have had a bacterial infection of your GI tract. What are the signs or symptoms? Symptoms of IBS vary from person to person. The main symptom is abdominal pain or  discomfort. Other symptoms usually include one or more of the following:  Diarrhea, constipation, or both.  Abdominal swelling or bloating.  Feeling full after eating a small or regular-sized meal.  Frequent gas.  Mucus in the stool.  A feeling of having more stool left after a bowel movement. Symptoms tend to come and go. They may be triggered by stress, mental health conditions, or certain foods. How is this diagnosed? This condition may be diagnosed based on a physical exam, your medical history, and your symptoms. You may have tests, such as:  Blood tests.  Stool test.  X-rays.  CT scan.  Colonoscopy. This is a procedure in which your GI tract is viewed with a long, thin, flexible tube. How is this treated? There is no cure for IBS, but treatment can help relieve symptoms. Treatment depends on the type of IBS you have, and may include:  Changes to your diet, such as: ? Avoiding foods that cause symptoms. ? Drinking more water. ? Following a low-FODMAP (fermentable oligosaccharides, disaccharides,  monosaccharides, and polyols) diet for up to 6 weeks, or as told by your health care provider. FODMAPs are sugars that are hard for some people to digest. ? Eating more fiber. ? Eating medium-sized meals at the same times every day.  Medicines. These may include: ? Fiber supplements, if you have constipation. ? Medicine to control diarrhea (antidiarrheal medicines). ? Medicine to help control muscle tightening (spasms) in your GI tract (antispasmodic medicines). ? Medicines to help with mental health conditions, such as antidepressants or tranquilizers.  Talk therapy or counseling.  Working with a diet and nutrition specialist (dietitian) to help create a food plan that is right for you.  Managing your stress. Follow these instructions at home: Eating and drinking  Eat a healthy diet.  Eat medium-sized meals at about the same time every day. Do not eat large  meals.  Gradually eat more fiber-rich foods. These include whole grains, fruits, and vegetables. This may be especially helpful if you have IBS with constipation.  Eat a diet low in FODMAPs.  Drink enough fluid to keep your urine pale yellow.  Keep a journal of foods that seem to trigger symptoms.  Avoid foods and drinks that: ? Contain added sugar. ? Make your symptoms worse. Dairy products, caffeinated drinks, and carbonated drinks can make symptoms worse for some people. General instructions  Take over-the-counter and prescription medicines and supplements only as told by your health care provider.  Get enough exercise. Do at least 150 minutes of moderate-intensity exercise each week.  Manage your stress. Getting enough sleep and exercise can help you manage stress.  Keep all follow-up visits as told by your health care provider and therapist. This is important. Alcohol Use  Do not drink alcohol if: ? Your health care provider tells you not to drink. ? You are pregnant, may be pregnant, or are planning to become pregnant.  If you drink alcohol, limit how much you have: ? 0-1 drink a day for women. ? 0-2 drinks a day for men.  Be aware of how much alcohol is in your drink. In the [place] one drink equals one typical bottle of beer (12 oz), one-half glass of wine (5 oz), or one shot of hard liquor (1 oz). Contact a health care provider if you have:  Constant pain.  Weight loss.  Difficulty or pain when swallowing.  Diarrhea that gets worse. Get help right away if you have:  Severe abdominal pain.  Fever.  Diarrhea with symptoms of dehydration, such as dizziness or dry mouth.  Bright red blood in your stool.  Stool that is black and tarry.  Abdominal swelling.  Vomiting that does not stop.  Blood in your vomit. Summary  Irritable bowel syndrome (IBS) is not one specific disease. It is a group of symptoms that affects digestion.  Your intestines may become  more sensitive and overreact to certain things. This may be especially true when you eat certain foods or when you are under stress.  There is no cure for IBS, but treatment can help relieve symptoms. This information is not intended to replace advice given to you by your health care provider. Make sure you discuss any questions you have with your health care provider. Document Released: 09/27/2005 Document Revised: 09/20/2017 Document Reviewed: 09/20/2017 [employer] Interactive Patient Education  2019 [employer].

## 2019-03-27 NOTE — Discharge Summary (Signed)
Physician Discharge Summary  Samantha Mckenzie DGL:[id number] DOB: 22-Mar-1936 DOA: 03/19/2019  PCP: Colon Branch, MD  Admit date: 03/19/2019 Discharge date: 03/27/2019  Time spent: 45 minutes  Recommendations for Outpatient Follow-up:  Patient will be discharged to home with home health physical and occupational therapy.  Patient will need to follow up with primary care provider within one week of discharge. Follow up with Dr. Henrene Pastor, gastroenterology, in one month.  Patient should continue medications as prescribed.  Patient should follow a heart healthy/carb modified diet.   Discharge Diagnoses:  Sepsis secondary to bar community-acquired pneumonia/acute hypoxic respiratory failure Nausea, vomiting, diarrhea and abdominal pain Acute kidney injury chronic kidney disease, stage IV Upper extremity tremors Essential hypertension Hypokalemia/hypomagnesemia Diabetes mellitus, type II Gout  Diabetic peripheral neuropathy Hyperlipidemia GERD Deconditioning Situational depression  Discharge Condition: Stable  Diet recommendation: heart healthy/carb modified   Filed Weights   03/20/19 0700  Weight: 62.2 kg    History of present illness:  On 03/20/2019 by Dr. Melton Krebs Samantha Smithis a 83 y.o.femalewith medical history significant ofCKD stage 4, HTN.  Patient presents to the ED with c/o abd pain, N/V/D. Symptoms onset suddenly about 1.5h PTA in the ED, persistent, nothing makes better or worse. Symptoms are severe. Abd pain is generalized.  Hospital Course:  Sepsis secondary to bar community-acquired pneumonia/acute hypoxic respiratory failure -on admission, patient with leukocytosis, tachypnea -Chest x-ray showed cardiomegaly with mild volume overload.  Trace bilateral pleural effusions. -CT abdomen/pelvis did show findings concerning for right lower lobe pneumonia -COVID, respiratory viral panel negative -Blood cultures showed no growth to date -Continue azithromycin  and ceftriaxone- completed antibiotics during hospitalization  -Patient was noted to have oxygen saturations in the 70s when she presented to the emergency department, needing 2 L of nasal cannula. -Currently on room air and maintaining O2 saturations in the 90s -question of dysphagia, which places patient at increased risk of aspiration- speech therapy consulted- recommended regular diet with thin liquids  Nausea, vomiting, diarrhea and abdominal pain -Patient denies any recent vomiting or diarrhea but continues to have abdominal pain -CT abdomen and pelvis: Sigmoid diverticulosis without CT evidence of diverticulitis. -?  Musculoskeletal secondary to coughing from pneumonia and recent vomiting- also states that the pain worsens with movement -Continue Voltaren gel and K pad -LFTs and lipase WNL -Continue pain control and antiemetics -Abdominal US: cholelithiasis and gallbladder sludge without complicating factors -Gastroenterology consulted and appreciated. Was given NuLYTELY to help alleviate RLQ pain.  -Patient did have several bowel movements after using Nulytely. -appears to be improving slowly -Gastroenterology feels patient may have IBS with alternating constipation and diarrhea exacerbated by acute illness and depression.  May also have a component of musculoskeletal right lower quadrant pain.  Patient placed on dicyclomine, increase to 20 mg 4 times daily AC & QHS, Zofran 4 mg p.o. 3 times daily for several days, MiraLAX for constipation as needed.  Avoid opiates for abdominal pain.  Follow-up with Dr. Henrene Pastor in 1 month.  Acute kidney injury chronic kidney disease, stage IV -Resolved with IV fluids  -Creatinine on admission 2.79 (Baseline creatinine approximate 1.7-2.4) -Creatinine currently 2.45 -Abd [place]: increased echogenicity consistent with medical renal disease. Samantha renal cyst  Upper extremity tremors -patient complains of UE tremors when her hands get cold only and that she  cannot pick up a cup of coffee or even the phone -on examination, no tremors noted when UE are fully extended  -?due to weakness and recent poor oral intake as  well as infection -discussed with neurology, Dr. Leonel Ramsay, referred to this as possible Enhanced physiological tremors. No treatment, will get better with time. -obtained Vit D 25-hydroxy 61.5, Vitamin B12 2,183, TSH 1.025    -Resolved  Essential hypertension -Blood pressure stable, continue amlodipine, metoprolol  Hypokalemia/hypomagnesemia -Resolved with replacement.   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 -was placed on ISS with CBG monitoring  -appears to be diet controlled  -patient to follow up with PCP  Gout  -Stable, continue allopurinol  Diabetic peripheral neuropathy -Continue Neurontin  Hyperlipidemia -Continue statin  GERD -Continue PPI  Deconditioning -per PT, patient fell several times prior to admission -she is visible weak when trying to use the walker and SNF was recommended -Social work consulted -discussed SNF with patient- she states she would rather go home with home heatlh  Situational depression -Seems that patient has been relaying one thing to the nurses and something different to me.  -She states she wants to feel and get better. She has no intentions of hurting herself or anyone else. She states she did not ask the nurse to "help her" in killing herself.  -Patient appears to be situational depressed. She does not appear to be suicidal.  -Psychiatry consulted and appreciated.  Feels patient is not suicidal or homicidal.  Agreed with using Remeron. -Patient started on Remeron 15 mg nightly  Consultants Neurology, Dr. Leonel Ramsay, via phone Gastroenterology  Psychiatry  Procedures  Abd [place]  Discharge Exam: Vitals:   03/26/19 2112 03/26/19 2343  BP: (!) 153/63 (!) 155/57  Pulse: 80 75  Resp: 16 14  Temp: 97.8 F (36.6  C) 98.4 F (36.9 C)  SpO2: 96% 97%     General: Well developed, well nourished, NAD, appears stated age  HEENT: NCAT, mucous membranes moist.  Neck: Supple  Cardiovascular: S1 S2 auscultated, soft SEM, RRR  Respiratory: Clear to auscultation bilaterally  Abdomen: Soft, RLQ TTP, nondistended, + bowel sounds  Extremities: warm dry without cyanosis clubbing or edema  Neuro: AAOx3, nonfocal  Skin: Without rashes exudates or nodules  Psych: Appropriate mood and affect  Discharge Instructions Discharge Instructions    Discharge instructions   Complete by: As directed    Patient will be discharged to home with home health physical and occupational therapy.  Patient will need to follow up with primary care provider within one week of discharge. Follow up with Dr. Henrene Pastor, gastroenterology, in one month.  Patient should continue medications as prescribed.  Patient should follow a heart healthy/carb modified diet.     Allergies as of 03/27/2019      Reactions   Codeine Nausea And Vomiting   Morphine Nausea And Vomiting   Oxycodone-acetaminophen Nausea And Vomiting   Sulfonamide Derivatives Nausea And Vomiting   Tramadol Hcl Nausea And Vomiting      Medication List    STOP taking these medications   omeprazole 20 MG tablet Commonly known as: PRILOSEC OTC     TAKE these medications   allopurinol 100 MG tablet Commonly known as: ZYLOPRIM Take 1 tablet (100 mg total) by mouth daily.   amLODipine 10 MG tablet Commonly known as: NORVASC Take 1 tablet (10 mg total) by mouth daily.   aspirin 81 MG tablet Take 81 mg by mouth daily.   atorvastatin 20 MG tablet Commonly known as: LIPITOR Take 1 tablet (20 mg total) by mouth daily.   CENTRUM SILVER PO Take 1 tablet by mouth daily.   dicyclomine 10 MG  capsule Commonly known as: BENTYL Take 2 capsules (20 mg total) by mouth 4 (four) times daily -  before meals and at bedtime for 30 days.   furosemide 40 MG  tablet Commonly known as: LASIX Take 40 mg by mouth 2 (two) times daily.   gabapentin 100 MG capsule Commonly known as: NEURONTIN Take 2 capsules (200 mg total) by mouth at bedtime.   metoprolol tartrate 25 MG tablet Commonly known as: LOPRESSOR Take 1 tablet (25 mg total) by mouth 2 (two) times daily.   mirtazapine 15 MG tablet Commonly known as: REMERON Take 1 tablet (15 mg total) by mouth at bedtime.   ondansetron 4 MG disintegrating tablet Commonly known as: Zofran ODT Take 1 tablet (4 mg total) by mouth every 8 (eight) hours as needed for nausea or vomiting.   ondansetron 4 MG tablet Commonly known as: ZOFRAN Take 1 tablet (4 mg total) by mouth 3 (three) times daily with meals.   pantoprazole 40 MG tablet Commonly known as: PROTONIX Take 1 tablet (40 mg total) by mouth daily.   vitamin B-12 1000 MCG tablet Commonly known as: CYANOCOBALAMIN Take 1,000 mcg by mouth daily.   Vitamin D3 25 MCG (1000 UT) Caps Take 1 capsule by mouth daily.            Durable Medical Equipment  (From admission, onward)         Start     Ordered   03/26/19 0956  For home use only DME 4 wheeled rolling walker with seat  Once    Question:  Patient needs a walker to treat with the following condition  Answer:  Weakness   03/26/19 0956         Allergies  Allergen Reactions   Codeine Nausea And Vomiting   Morphine Nausea And Vomiting   Oxycodone-Acetaminophen Nausea And Vomiting   Sulfonamide Derivatives Nausea And Vomiting   Tramadol Hcl Nausea And Vomiting   Follow-up Information    [facility] Follow up.   Why: HHPT (vestibular) , HHOT, [place] Follow up.   Why: rollator       Colon Branch, MD.   Specialty: Internal Medicine Why: Scheduled for April 03, 2019 at 3:00 p.m. in the office. Contact information: [address] [place] [address] [phone]        Irene Shipper, MD. Schedule an  appointment as soon as possible for a visit in 1 month(s).   Specialty: Gastroenterology Why: Hospital follow up Contact information: [address] [place] [address] [phone]            The results of significant diagnostics from this hospitalization (including imaging, microbiology, ancillary and laboratory) are listed below for reference.    Significant Diagnostic Studies: Ct Abdomen Pelvis Wo Contrast  Result Date: 03/19/2019 CLINICAL DATA:  Abdominal pain with diverticulitis suspected. EXAM: CT ABDOMEN AND PELVIS WITHOUT CONTRAST TECHNIQUE: Multidetector CT imaging of the abdomen and pelvis was performed following the standard protocol without IV contrast. COMPARISON:  06/20/2018. FINDINGS: Lower chest: There is airspace consolidation at the right lung base. There is atelectasis at the left lung base. The heart size is enlarged. Aortic calcifications are noted. Hepatobiliary: No focal liver abnormality is seen. No gallstones, gallbladder wall thickening, or biliary dilatation. Pancreas: Unremarkable. No pancreatic ductal dilatation or surrounding inflammatory changes. Spleen: A few splenic calcifications are noted. Adrenals/Urinary Tract: There are bilateral stable  renal cortical cysts, some of which are hyperdense especially on the left. There is no hydronephrosis. There are no radiopaque obstructing kidney stones. The bladder is significantly distended. The adrenal glands are unremarkable. Stomach/Bowel: There is sigmoid diverticulosis without definite CT evidence of diverticulitis. There is no evidence of a small-bowel obstruction. The appendix is not reliably identified, however there are no inflammatory changes in the right lower quadrant. The stomach is unremarkable. Vascular/Lymphatic: Aortic atherosclerosis. No enlarged abdominal or pelvic lymph nodes. Reproductive: Uterus and bilateral adnexa are unremarkable. Other: No abdominal wall hernia or abnormality. No  abdominopelvic ascites. Musculoskeletal: No acute or significant osseous findings. IMPRESSION: 1. Findings concerning for a right lower lobe pneumonia. 2. Sigmoid diverticulosis without CT evidence of diverticulitis. 3. Distended urinary bladder. 4.  Aortic Atherosclerosis (ICD10-I70.0). Electronically Signed   By: Constance Holster M.D.   On: 03/19/2019 22:55   US Abdomen Complete  Result Date: 03/23/2019 CLINICAL DATA:  Abdominal pain, acute renal injury EXAM: ABDOMEN ULTRASOUND COMPLETE COMPARISON:  03/19/2019 CT of the abdomen and pelvis FINDINGS: Gallbladder: Well distended with cholelithiasis and gallbladder sludge. No pericholecystic fluid is noted. Negative sonographic Murphy's sign is elicited. Common bile duct: Diameter: 3.9 mm. Liver: No focal lesion identified. Within normal limits in parenchymal echogenicity. Portal vein is patent on color Doppler imaging with normal direction of blood flow towards the liver. IVC: No abnormality visualized. Pancreas: Visualized portion unremarkable. Spleen: Size and appearance within normal limits. Right Kidney: Length: 7.3 cm. 2.5 cm cyst is noted in the midportion of the right kidney stable from the prior CT. Increased echogenicity is noted. Left Kidney: Length: 7.5 cm. No hydronephrosis is noted. Prominent extrarenal pelvis on the left is noted similar to that seen on CT. Increased echogenicity is noted. Abdominal aorta: No aneurysm visualized. Other findings: None. IMPRESSION: Cholelithiasis and gallbladder sludge without complicating factors. Increased echogenicity consistent with medical renal disease. Right renal cyst. Electronically Signed   By: Inez Catalina M.D.   On: 03/23/2019 15:20   Dg Chest Port 1 View  Result Date: 03/19/2019 CLINICAL DATA:  Lower abdominal pain and nausea. EXAM: PORTABLE CHEST 1 VIEW COMPARISON:  Chest x-ray dated 06/02/2011 FINDINGS: The heart size is enlarged. There is mild volume overload without overt pulmonary edema. There  are small bilateral pleural effusions. Multiple surgical staples project over the left axilla. There is no displaced fracture. No large focal area of consolidation. There is elevation of the right hemidiaphragm. IMPRESSION: 1. Cardiomegaly with mild volume overload. 2. Trace bilateral pleural effusions. Electronically Signed   By: Constance Holster M.D.   On: 03/19/2019 23:52    Microbiology: Recent Results (from the past 240 hour(s))  SARS Coronavirus 2 (CEPHEID - Performed in [facility] hospital lab), Hosp Order     Status: None   Collection Time: 03/19/19 11:38 PM   Specimen: Nasopharyngeal Swab  Result Value Ref Range Status   SARS Coronavirus 2 NEGATIVE NEGATIVE Final    Comment: (NOTE) If result is NEGATIVE SARS-CoV-2 target nucleic acids are NOT DETECTED. The SARS-CoV-2 RNA is generally detectable in upper and lower  respiratory specimens during the acute phase of infection. The lowest  concentration of SARS-CoV-2 viral copies this assay can detect is 250  copies / mL. A negative result does not preclude SARS-CoV-2 infection  and should not be used as the sole basis for treatment or other  patient management decisions.  A negative result may occur with  improper specimen collection / handling, submission of specimen other  than  nasopharyngeal swab, presence of viral mutation(s) within the  areas targeted by this assay, and inadequate number of viral copies  (<250 copies / mL). A negative result must be combined with clinical  observations, patient history, and epidemiological information. If result is POSITIVE SARS-CoV-2 target nucleic acids are DETECTED. The SARS-CoV-2 RNA is generally detectable in upper and lower  respiratory specimens dur ing the acute phase of infection.  Positive  results are indicative of active infection with SARS-CoV-2.  Clinical  correlation with patient history and other diagnostic information is  necessary to determine patient infection status.   Positive results do  not rule out bacterial infection or co-infection with other viruses. If result is PRESUMPTIVE POSTIVE SARS-CoV-2 nucleic acids MAY BE PRESENT.   A presumptive positive result was obtained on the submitted specimen  and confirmed on repeat testing.  While 2019 novel coronavirus  (SARS-CoV-2) nucleic acids may be present in the submitted sample  additional confirmatory testing may be necessary for epidemiological  and / or clinical management purposes  to differentiate between  SARS-CoV-2 and other Sarbecovirus currently known to infect humans.  If clinically indicated additional testing with an alternate test  methodology [phone]) is advised. The SARS-CoV-2 RNA is generally  detectable in upper and lower respiratory sp ecimens during the acute  phase of infection. The expected result is Negative. Fact Sheet for Patients:  StrictlyIdeas.no Fact Sheet for Healthcare Providers: BankingDealers.co.za This test is not yet approved or cleared by the [place] FDA and has been authorized for detection and/or diagnosis of SARS-CoV-2 by FDA under an Emergency Use Authorization (EUA).  This EUA will remain in effect (meaning this test can be used) for the duration of the COVID-19 declaration under Section 564(b)(1) of the Act, 21 U.S.C. section 360bbb-3(b)(1), unless the authorization is terminated or revoked sooner. Performed at [facility], [place] [address] [place], [place] [address]   Respiratory Panel by PCR     Status: None   Collection Time: 03/20/19 12:57 AM   Specimen: Nasopharyngeal Swab; Respiratory  Result Value Ref Range Status   Adenovirus NOT DETECTED NOT DETECTED Final   Coronavirus 229E NOT DETECTED NOT DETECTED Final    Comment: (NOTE) The Coronavirus on the Respiratory Panel, DOES NOT test for the novel  Coronavirus (2019 nCoV)    Coronavirus HKU1 NOT DETECTED NOT DETECTED Final   Coronavirus  NL63 NOT DETECTED NOT DETECTED Final   Coronavirus OC43 NOT DETECTED NOT DETECTED Final   Metapneumovirus NOT DETECTED NOT DETECTED Final   Rhinovirus / Enterovirus NOT DETECTED NOT DETECTED Final   Influenza A NOT DETECTED NOT DETECTED Final   Influenza B NOT DETECTED NOT DETECTED Final   Parainfluenza Virus 1 NOT DETECTED NOT DETECTED Final   Parainfluenza Virus 2 NOT DETECTED NOT DETECTED Final   Parainfluenza Virus 3 NOT DETECTED NOT DETECTED Final   Parainfluenza Virus 4 NOT DETECTED NOT DETECTED Final   Respiratory Syncytial Virus NOT DETECTED NOT DETECTED Final   Bordetella pertussis NOT DETECTED NOT DETECTED Final   Chlamydophila pneumoniae NOT DETECTED NOT DETECTED Final   Mycoplasma pneumoniae NOT DETECTED NOT DETECTED Final    Comment: Performed at [facility], [place]. [address] [place],  [address]  Culture, blood (routine x 2) Call MD if unable to obtain prior to antibiotics being given     Status: None   Collection Time: 03/20/19  2:48 AM   Specimen: BLOOD RIGHT WRIST  Result Value Ref Range Status   Specimen  Description BLOOD RIGHT WRIST  Final   Special Requests   Final    BOTTLES DRAWN AEROBIC ONLY Blood Culture results may not be optimal due to an inadequate volume of blood received in culture bottles   Culture   Final    NO GROWTH 5 DAYS Performed at [facility], [place] [address] [place], [place] [address]    Report Status 03/25/2019 FINAL  Final  Culture, blood (routine x 2) Call MD if unable to obtain prior to antibiotics being given     Status: None   Collection Time: 03/20/19  5:26 AM   Specimen: BLOOD LEFT HAND  Result Value Ref Range Status   Specimen Description BLOOD LEFT HAND  Final   Special Requests   Final    BOTTLES DRAWN AEROBIC ONLY Blood Culture results may not be optimal due to an inadequate volume of blood received in culture bottles   Culture   Final    NO GROWTH 5 DAYS Performed at [facility], [place] [address]  [place], [place] [address]    Report Status 03/25/2019 FINAL  Final     Labs: Basic Metabolic Panel: Recent Labs  Lab 03/21/19 0533 03/22/19 0821 03/23/19 0556 03/24/19 0827 03/27/19 0552  NA 141 135 138 140 141  K 3.7 3.4* 3.9 3.7 3.1*  CL 102 97* 101 105 108  CO2 25 27 25 22 24   GLUCOSE 140* 151* 124* 94 91  BUN 31* 34* 41* 46* 35*  CREATININE 1.88* 2.19* 2.63* 2.45* 2.17*  CALCIUM 8.3* 7.9* 8.3* 8.1* 7.7*  MG 2.4  --   --   --   --    Liver Function Tests: Recent Labs  Lab 03/23/19 0556  AST 22  ALT 18  ALKPHOS 73  BILITOT 0.5  PROT 7.0  ALBUMIN 2.4*   Recent Labs  Lab 03/23/19 0556  LIPASE 29   No results for input(s): AMMONIA in the last 168 hours. CBC: Recent Labs  Lab 03/21/19 0533 03/22/19 0821 03/23/19 0556 03/24/19 0827  WBC 18.5* 15.4* 16.6* 10.6*  HGB 10.8* 10.7* 10.8* 10.0*  HCT 32.4* 32.0* 32.3* 30.7*  MCV 92.3 91.7 92.3 92.7  PLT 183 180 196 198   Cardiac Enzymes: No results for input(s): CKTOTAL, CKMB, CKMBINDEX, TROPONINI in the last 168 hours. BNP: BNP (last 3 results) No results for input(s): BNP in the last 8760 hours.  ProBNP (last 3 results) No results for input(s): PROBNP in the last 8760 hours.  CBG: Recent Labs  Lab 03/26/19 1210 03/26/19 1248 03/26/19 1731 03/26/19 2123 03/27/19 0717  GLUCAP 56* 155* 83 124* 89       Signed:  Jibril Mcminn  [facility] 03/27/2019, 9:47 AM

## 2019-03-27 NOTE — Progress Notes (Addendum)
    Progress Note   Subjective  Several loose bowel movements post Nulytely, abdominal pain, bloating and little appetite.    Objective  Vital signs in last 24 hours: Temp:  [97.8 F (36.6 C)-98.4 F (36.9 C)] 98.4 F (36.9 C) (06/15 2343) Pulse Rate:  [75-80] 75 (06/15 2343) Resp:  [14-16] 14 (06/15 2343) BP: (146-155)/(57-63) 155/57 (06/15 2343) SpO2:  [94 %-97 %] 97 % (06/15 2343) Last BM Date: 03/26/19  General: Alert, well-developed, in NAD Heart:  Regular rate and rhythm; no murmurs Chest: Clear to ascultation bilaterally Abdomen:  Soft, mild generalized tenderness and mildly distended. Normal bowel sounds, without guarding, and without rebound.   Extremities:  Without edema. Neurologic:  Alert and  oriented x4; grossly normal neurologically. Psych:  Alert and cooperative. Depressed affect.  Intake/Output from previous day: No intake/output data recorded. Intake/Output this shift: No intake/output data recorded.  Lab Results: No results for input(s): WBC, HGB, HCT, PLT in the last 72 hours. BMET Recent Labs    03/27/19 0552  NA 141  K 3.1*  CL 108  CO2 24  GLUCOSE 91  BUN 35*  CREATININE 2.17*  CALCIUM 7.7*      Assessment & Recommendations   1. RLQ pain, now generalized pain with bloating. N/V resolving. Constipation resolved post Nulytely. Suspected IBS with alternating constipation and diarrhea exacerbated by acute illness and depression. Possible component of musculoskeletal RLQ pain.  Increase dicyclomine to 20 mg po qid, ac & hs Add Zofran 4 mg po tid ac for several days  Miralax po prn constipation Attempt to avoid opioids for abdominal pain  GI follow up with Dr. Scarlette Shorts in 1 month   2. GERD.  Continue Pantoprazole 40 mg qd  Follow antireflux measures long term   3. RLL CAP with sepsis, improving.   4. Depression. Started Remeron.   5. Hypokalemia. Per primary service.   6. CKD.  GI signing off. Outpatient follow up as outlined.     LOS: 7 days    Sandy Haye T. Fuller Plan MD 03/27/2019, 8:49 AM

## 2019-03-27 NOTE — Plan of Care (Signed)

## 2019-03-28 ENCOUNTER — Ambulatory Visit: Payer: Medicare Other | Admitting: Internal Medicine

## 2019-03-28 ENCOUNTER — Telehealth: Payer: Self-pay | Admitting: *Deleted

## 2019-03-28 ENCOUNTER — Telehealth: Payer: Self-pay | Admitting: Internal Medicine

## 2019-03-28 DIAGNOSIS — E1142 Type 2 diabetes mellitus with diabetic polyneuropathy: Secondary | ICD-10-CM | POA: Diagnosis not present

## 2019-03-28 DIAGNOSIS — F4321 Adjustment disorder with depressed mood: Secondary | ICD-10-CM | POA: Diagnosis not present

## 2019-03-28 DIAGNOSIS — I129 Hypertensive chronic kidney disease with stage 1 through stage 4 chronic kidney disease, or unspecified chronic kidney disease: Secondary | ICD-10-CM | POA: Diagnosis not present

## 2019-03-28 DIAGNOSIS — N184 Chronic kidney disease, stage 4 (severe): Secondary | ICD-10-CM | POA: Diagnosis not present

## 2019-03-28 DIAGNOSIS — Z8701 Personal history of pneumonia (recurrent): Secondary | ICD-10-CM | POA: Diagnosis not present

## 2019-03-28 DIAGNOSIS — E1122 Type 2 diabetes mellitus with diabetic chronic kidney disease: Secondary | ICD-10-CM | POA: Diagnosis not present

## 2019-03-28 DIAGNOSIS — Z7982 Long term (current) use of aspirin: Secondary | ICD-10-CM | POA: Diagnosis not present

## 2019-03-28 NOTE — Telephone Encounter (Signed)
Transition Care Management Follow-up Telephone Call   Date discharged? 03/27/19   How have you been since you were released from the hospital? "Still having trouble getting around"   Do you understand why you were in the hospital? yes   Do you understand the discharge instructions? yes   Where were you discharged to? home   Items Reviewed:  Medications reviewed: pt does not have list near by  Allergies reviewed: yes  Dietary changes reviewed: yes  Referrals reviewed: yes   Functional Questionnaire:   Activities of Daily Living (ADLs):   She states they are independent in the following: ambulation, bathing and hygiene, feeding, continence, grooming, toileting and dressing States they require assistance with the following: is now using rolling walker.   Any transportation issues/concerns?: no   Any patient concerns? no   Confirmed importance and date/time of follow-up visits scheduled yes  Provider Appointment booked with 04/03/19 w PCP  Confirmed with patient if condition begins to worsen call PCP or go to the ER.  Patient was given the office number and encouraged to call back with question or concerns.  : yes

## 2019-03-28 NOTE — Progress Notes (Signed)
Received message from Charge RN that patient son called and would like for NCM to call him. NCM called Pat at [phone], he states he knows she does not want to go to a snf but when she got home yesterday they found out the spouse could not handle her and she was falling, would like for her to go to a snf.  NCM informed him that patient has to be willing to go to a snf  Being that she refused snf, and that NCM contacted the [facility] agency to let them know to add a CSW to her services so that this can be facilitated and that the [facility] will be contacting son also.

## 2019-03-28 NOTE — Telephone Encounter (Signed)
Caller name: Claiborne Billings  Relation to pt: PT from [facility]  Call back number: [phone]    Reason for call:  Requesting verbal orders for Home health PT 1x 4 patient was recently discharged for sepsis

## 2019-03-28 NOTE — Telephone Encounter (Signed)
LMOM for [employer]- verbal orders given.

## 2019-03-29 ENCOUNTER — Telehealth: Payer: Self-pay | Admitting: Internal Medicine

## 2019-03-29 DIAGNOSIS — Z8701 Personal history of pneumonia (recurrent): Secondary | ICD-10-CM | POA: Diagnosis not present

## 2019-03-29 DIAGNOSIS — E1142 Type 2 diabetes mellitus with diabetic polyneuropathy: Secondary | ICD-10-CM | POA: Diagnosis not present

## 2019-03-29 DIAGNOSIS — F4321 Adjustment disorder with depressed mood: Secondary | ICD-10-CM | POA: Diagnosis not present

## 2019-03-29 DIAGNOSIS — E1122 Type 2 diabetes mellitus with diabetic chronic kidney disease: Secondary | ICD-10-CM | POA: Diagnosis not present

## 2019-03-29 DIAGNOSIS — I129 Hypertensive chronic kidney disease with stage 1 through stage 4 chronic kidney disease, or unspecified chronic kidney disease: Secondary | ICD-10-CM | POA: Diagnosis not present

## 2019-03-29 DIAGNOSIS — N184 Chronic kidney disease, stage 4 (severe): Secondary | ICD-10-CM | POA: Diagnosis not present

## 2019-03-29 NOTE — Telephone Encounter (Signed)
Spoke w/ Erlene Quan- verbal orders given.

## 2019-03-29 NOTE — Telephone Encounter (Signed)
Home Health Verbal Orders - [facility]  Requesting OT Frequency: 1x4

## 2019-04-03 ENCOUNTER — Ambulatory Visit (INDEPENDENT_AMBULATORY_CARE_PROVIDER_SITE_OTHER): Payer: Medicare Other | Admitting: Internal Medicine

## 2019-04-03 ENCOUNTER — Other Ambulatory Visit: Payer: Self-pay

## 2019-04-03 ENCOUNTER — Telehealth: Payer: Self-pay | Admitting: Internal Medicine

## 2019-04-03 ENCOUNTER — Ambulatory Visit: Payer: Self-pay | Admitting: Internal Medicine

## 2019-04-03 DIAGNOSIS — N289 Disorder of kidney and ureter, unspecified: Secondary | ICD-10-CM

## 2019-04-03 DIAGNOSIS — J9690 Respiratory failure, unspecified, unspecified whether with hypoxia or hypercapnia: Secondary | ICD-10-CM | POA: Diagnosis not present

## 2019-04-03 DIAGNOSIS — F32A Depression, unspecified: Secondary | ICD-10-CM

## 2019-04-03 DIAGNOSIS — E118 Type 2 diabetes mellitus with unspecified complications: Secondary | ICD-10-CM | POA: Diagnosis not present

## 2019-04-03 DIAGNOSIS — J181 Lobar pneumonia, unspecified organism: Secondary | ICD-10-CM | POA: Diagnosis not present

## 2019-04-03 DIAGNOSIS — J189 Pneumonia, unspecified organism: Secondary | ICD-10-CM

## 2019-04-03 DIAGNOSIS — F329 Major depressive disorder, single episode, unspecified: Secondary | ICD-10-CM

## 2019-04-03 NOTE — Telephone Encounter (Signed)
Pts husband called to report pt "Has pain all over and is sleeping a lot." Called back, spoke to pt who states "Im stiff all over. Just do not feel weel." Has appt with Dr. Larose Kells at 1500. Wanted to cancel. Husband on other line speaking with practice. Dr. Larose Kells will call pt at 1500 today. Pt aware.  Reason for Disposition . Nursing judgment or information in reference  Answer Assessment - Initial Assessment Questions 1. REASON FOR CALL: "What is your main concern right now?"     cancel appt 2. ONSET: "When did the *No Answer* start?"     *No Answer* 3. SEVERITY: "How bad is the *No Answer*?"     *No Answer* 4. FEVER: "Do you have a fever?"     *No Answer* 5. OTHER SYMPTOMS: "Do you have any other new symptoms?"     *No Answer* 6. INTERVENTIONS AND RESPONSE: "What have you done so far to try to make this better? What medications have you used?"     *No Answer*  Protocols used: NO GUIDELINE AVAILABLE-A-AH

## 2019-04-03 NOTE — Patient Instructions (Signed)
It was nice to talk to you over the phone today  We will call you and schedule blood work to be done soon and a visit to see me in 5 to 6 weeks but close sooner than that if you need me.  Continue the same medications, see attached list.  Increase Remeron 15  mg to 2 tablets daily  Please drink plenty fluids and try to eat some solids as well.  Please contact the [place] counselors at [phone]

## 2019-04-03 NOTE — Progress Notes (Signed)
Subjective:    Patient ID: Samantha Mckenzie, female    DOB: 04/18/1936, 83 y.o.   MRN: [id number]  DOS:  04/03/2019 Type of visit - description: Attempted  to make this a video visit, due to technical difficulties from the patient side it was not possible  thus we proceeded with a Virtual Visit via Telephone    I connected with@ on 04/04/19 at  3:00 PM EDT by telephone and verified that I am speaking with the correct person using two identifiers.  THIS ENCOUNTER IS A VIRTUAL VISIT DUE TO COVID-19 - PATIENT WAS NOT SEEN IN THE OFFICE. PATIENT HAS CONSENTED TO VIRTUAL VISIT / TELEMEDICINE VISIT   Location of patient: home  Location of provider: office  I discussed the limitations, risks, security and privacy concerns of performing an evaluation and management service by telephone and the availability of in person appointments. I also discussed with the patient that there may be a patient responsible charge related to this service. The patient expressed understanding and agreed to proceed.   History of Present Illness: TCM 7 Admitted to hospital 03/19/2019, discharged 03/27/2019  Diagnosis was sepsis due to community-acquired pneumonia with acute hypoxic respiratory failure. Upon admission, chest x-ray showed cardiomegaly and volume overload evidence. COVID and respiratory panel negative Completed azithromycin and ceftriaxone in-house. She continued to be hypoxic, at time of discharge O2 sat in the 90s.  Speech therapy consulted due to dysphagia.  Recommended regular diet with thin liquids.  Nausea vomiting diarrhea and abdominal pain: Work-up negative, GI was consulted, symptoms felt to be related possibly to IBS with constipation/  Diarrhea  Kidney function was decreased.  Upper extremity tremors: Vitamin D, vitamin B12, TSH negative, symptoms resolved  Depression, appears to be situational.  No suicidal ideas, psychiatry consulted.  They recommended Remeron  Review of Systems I  spoke with the patient and her husband. States that since she left the hospital she is feeling terrible: Has no desire to walk, to move or to  get out of bed. "I just like to lie down in bed".  Despite that statement, she  strongly denies any suicidal ideas. Denies fever chills. + Mild cough.  No shortness of breath.  She has not been active so it is hard to for her to say if she has DOE.  GI symptoms: Continue with some abdominal pain, no nausea, vomiting.  She has diarrhea without blood in the stools, Imodium helping.  HTN: No ambulatory BPs   Past Medical History:  Diagnosis Date  . Allergic rhinitis   . Anemia   . Breast CA ([place])    surgery, chemo, XRT; had peripheral neuropathy (imbalance at times) after chemo  . Cellulitis    Left arm, recurrent   . Cellulitis LEFT arm recurrent 12/08/2011  . Chronic renal disease, stage IV (HCC)    Dr. Moshe Cipro  . Depression   . Fatigue   . GERD (gastroesophageal reflux disease)    gastritis, EGD 02/2007  . Gout   . Hyperlipidemia   . Hypertension   . Macular degeneration 2011  . Normal cardiac stress test 12/2014  . Osteoarthritis   . Renal insufficiency     Past Surgical History:  Procedure Laterality Date  . cataracts bilaterally  1 -2012  . LUNG BIOPSY  1999   neg  . MASTECTOMY Left 1999   and lymphnodes     Social History   Socioeconomic History  . Marital status: Married    Spouse name: Not  on file  . Number of children: 2  . Years of education: Not on file  . Highest education level: Not on file  Occupational History  . Occupation: retired     Fish farm manager: RETIRED  Social Needs  . Financial resource strain: Not on file  . Food insecurity    Worry: Not on file    Inability: Not on file  . Transportation needs    Medical: Not on file    Non-medical: Not on file  Tobacco Use  . Smoking status: Never Smoker  . Smokeless tobacco: Never Used  Substance and Sexual Activity  . Alcohol use: Yes    Alcohol/week:  0.0 standard drinks    Comment: rarely  . Drug use: No  . Sexual activity: Not Currently  Lifestyle  . Physical activity    Days per week: Not on file    Minutes per session: Not on file  . Stress: Not on file  Relationships  . Social Herbalist on phone: Not on file    Gets together: Not on file    Attends religious service: Not on file    Active member of club or organization: Not on file    Attends meetings of clubs or organizations: Not on file    Relationship status: Not on file  . Intimate partner violence    Fear of current or ex partner: Not on file    Emotionally abused: Not on file    Physically abused: Not on file    Forced sexual activity: Not on file  Other Topics Concern  . Not on file  Social History Narrative   Lives w/ husband , 2 children in [place], 2 [place] as of 04/03/2019      Reactions   Codeine Nausea And Vomiting   Morphine Nausea And Vomiting   Oxycodone-acetaminophen Nausea And Vomiting   Sulfonamide Derivatives Nausea And Vomiting   Tramadol Hcl Nausea And Vomiting      Medication List       Accurate as of April 03, 2019 11:59 PM. If you have any questions, ask your nurse or doctor.        allopurinol 100 MG tablet Commonly known as: ZYLOPRIM Take 1 tablet (100 mg total) by mouth daily.   amLODipine 10 MG tablet Commonly known as: NORVASC Take 1 tablet (10 mg total) by mouth daily.   aspirin 81 MG tablet Take 81 mg by mouth daily.   atorvastatin 20 MG tablet Commonly known as: LIPITOR Take 1 tablet (20 mg total) by mouth daily.   CENTRUM SILVER PO Take 1 tablet by mouth daily.   dicyclomine 10 MG capsule Commonly known as: BENTYL Take 2 capsules (20 mg total) by mouth 4 (four) times daily -  before meals and at bedtime for 30 days.   furosemide 40 MG tablet Commonly known as: LASIX Take 40 mg by mouth 2 (two) times daily.   gabapentin 100 MG capsule Commonly known as: NEURONTIN Take 2  capsules (200 mg total) by mouth at bedtime.   metoprolol tartrate 25 MG tablet Commonly known as: LOPRESSOR Take 1 tablet (25 mg total) by mouth 2 (two) times daily.   mirtazapine 15 MG tablet Commonly known as: REMERON Take 2 tablets (30 mg total) by mouth at bedtime. What changed: how much to take Changed by: Kathlene November, MD   ondansetron 4 MG disintegrating tablet Commonly known  as: Zofran ODT Take 1 tablet (4 mg total) by mouth every 8 (eight) hours as needed for nausea or vomiting.   ondansetron 4 MG tablet Commonly known as: ZOFRAN Take 1 tablet (4 mg total) by mouth 3 (three) times daily with meals.   pantoprazole 40 MG tablet Commonly known as: PROTONIX Take 1 tablet (40 mg total) by mouth daily.   vitamin B-12 1000 MCG tablet Commonly known as: CYANOCOBALAMIN Take 1,000 mcg by mouth daily.   Vitamin D3 25 MCG (1000 UT) Caps Take 1 capsule by mouth daily.           Objective:   Physical Exam There were no vitals taken for this visit. This was a virtual visit, via phone.    Assessment      Assessment  DM- neuropathy (paresthesias, nl pinprick exam), CKD HTN Hyperlipidemia: Lipitor intolerant? CAD? States see Dr Terrence Dupont, no OV  records. (-) stress test 12-2014 CKD -- sees nephrology Gout GERD, nl EGD 01-2015  H/o Depression Leg pain-- RLS vs neuropathy, on gabapentin since 2013, good results H/o anemia --- no iron def , Cscope 2012, normal EGD 01-2015 Breast cancer:  --S/p Surgery (L mastectomy), chemotherapy, XRT --Peripheral neuropathy felt to be due to chemotherapy --Recurrent left arm cellulitis DJD  PLAN TCM 7 Extensive chart review.  Most recent labs: Potassium 3.1, creatinine 2.1,Hemoglobin 10.0. Community-acquired pneumonia, respiratory failure: Admitted to the hospital with abdominal pain, nausea, vomiting.  Found to be hypoxic. Chest x-ray showed cardiomegaly and evidence of volume overload however CT of the abdomen showed pneumonia.  She  completed the antibiotic treatment in-house, was hypoxic for few additional days, eventually discharged home without oxygen.  She reports today no fever chills but is still have some cough. Plan: CMP, CBC, chest x-ray. Check O2 sat when she comes for a nurse visit Abdominal pain: Work-up negative, saw GI, IBS?  Symptoms about the same.  Observation for now HTN: Currently on metoprolol, amlodipine, Lasix.  Checking labs.  Will check vital signs when she comes for a nurse visit  CRI: Checking labs Depression: I believe this is the main driver of how the patient feels, she strongly denies uicidal ideas.  Feels that Remeron 35m daily is helping.  Will increase to 30 mg.  Encouraged to see a counselor, will send a letter with information DM: Due for A1c, diet controlled F/U Nurse visit in few days, labs, BPs, O2 sat check, chest x-ray Follow-up with me 5 to 6 weeks.      I discussed the assessment and treatment plan with the patient. The patient was provided an opportunity to ask questions and all were answered. The patient agreed with the plan and demonstrated an understanding of the instructions.   The patient was advised to call back or seek an in-person evaluation if the symptoms worsen or if the condition fails to improve as anticipated.  I provided 40  minutes of non-face-to-face time during this encounter.  JKathlene November MD

## 2019-04-03 NOTE — Telephone Encounter (Signed)
Opened in error

## 2019-04-04 ENCOUNTER — Emergency Department (HOSPITAL_BASED_OUTPATIENT_CLINIC_OR_DEPARTMENT_OTHER): Payer: Medicare Other

## 2019-04-04 ENCOUNTER — Encounter (HOSPITAL_BASED_OUTPATIENT_CLINIC_OR_DEPARTMENT_OTHER): Payer: Self-pay | Admitting: Emergency Medicine

## 2019-04-04 ENCOUNTER — Emergency Department (HOSPITAL_BASED_OUTPATIENT_CLINIC_OR_DEPARTMENT_OTHER)
Admission: EM | Admit: 2019-04-04 | Discharge: 2019-04-04 | Disposition: A | Discharge status: Home / Self Care | Payer: Medicare Other | Source: Home / Self Care | Attending: Emergency Medicine | Admitting: Emergency Medicine

## 2019-04-04 ENCOUNTER — Telehealth: Payer: Self-pay | Admitting: Internal Medicine

## 2019-04-04 ENCOUNTER — Other Ambulatory Visit: Payer: Self-pay

## 2019-04-04 DIAGNOSIS — E1140 Type 2 diabetes mellitus with diabetic neuropathy, unspecified: Secondary | ICD-10-CM | POA: Insufficient documentation

## 2019-04-04 DIAGNOSIS — S0101XA Laceration without foreign body of scalp, initial encounter: Secondary | ICD-10-CM | POA: Diagnosis not present

## 2019-04-04 DIAGNOSIS — Z20828 Contact with and (suspected) exposure to other viral communicable diseases: Secondary | ICD-10-CM | POA: Diagnosis not present

## 2019-04-04 DIAGNOSIS — D509 Iron deficiency anemia, unspecified: Secondary | ICD-10-CM | POA: Diagnosis not present

## 2019-04-04 DIAGNOSIS — W010XXA Fall on same level from slipping, tripping and stumbling without subsequent striking against object, initial encounter: Secondary | ICD-10-CM | POA: Insufficient documentation

## 2019-04-04 DIAGNOSIS — Z79899 Other long term (current) drug therapy: Secondary | ICD-10-CM | POA: Insufficient documentation

## 2019-04-04 DIAGNOSIS — I130 Hypertensive heart and chronic kidney disease with heart failure and stage 1 through stage 4 chronic kidney disease, or unspecified chronic kidney disease: Secondary | ICD-10-CM | POA: Diagnosis not present

## 2019-04-04 DIAGNOSIS — R220 Localized swelling, mass and lump, head: Secondary | ICD-10-CM | POA: Diagnosis not present

## 2019-04-04 DIAGNOSIS — R531 Weakness: Secondary | ICD-10-CM | POA: Diagnosis not present

## 2019-04-04 DIAGNOSIS — I129 Hypertensive chronic kidney disease with stage 1 through stage 4 chronic kidney disease, or unspecified chronic kidney disease: Secondary | ICD-10-CM | POA: Insufficient documentation

## 2019-04-04 DIAGNOSIS — Z853 Personal history of malignant neoplasm of breast: Secondary | ICD-10-CM | POA: Insufficient documentation

## 2019-04-04 DIAGNOSIS — K529 Noninfective gastroenteritis and colitis, unspecified: Secondary | ICD-10-CM | POA: Diagnosis not present

## 2019-04-04 DIAGNOSIS — E876 Hypokalemia: Secondary | ICD-10-CM | POA: Diagnosis not present

## 2019-04-04 DIAGNOSIS — Z7982 Long term (current) use of aspirin: Secondary | ICD-10-CM | POA: Insufficient documentation

## 2019-04-04 DIAGNOSIS — N184 Chronic kidney disease, stage 4 (severe): Secondary | ICD-10-CM | POA: Insufficient documentation

## 2019-04-04 DIAGNOSIS — F4321 Adjustment disorder with depressed mood: Secondary | ICD-10-CM | POA: Diagnosis not present

## 2019-04-04 DIAGNOSIS — Y929 Unspecified place or not applicable: Secondary | ICD-10-CM | POA: Insufficient documentation

## 2019-04-04 DIAGNOSIS — R296 Repeated falls: Secondary | ICD-10-CM | POA: Diagnosis not present

## 2019-04-04 DIAGNOSIS — Y9301 Activity, walking, marching and hiking: Secondary | ICD-10-CM | POA: Insufficient documentation

## 2019-04-04 DIAGNOSIS — E1142 Type 2 diabetes mellitus with diabetic polyneuropathy: Secondary | ICD-10-CM | POA: Diagnosis not present

## 2019-04-04 DIAGNOSIS — I5032 Chronic diastolic (congestive) heart failure: Secondary | ICD-10-CM | POA: Diagnosis not present

## 2019-04-04 DIAGNOSIS — K6389 Other specified diseases of intestine: Secondary | ICD-10-CM | POA: Diagnosis not present

## 2019-04-04 DIAGNOSIS — S0191XA Laceration without foreign body of unspecified part of head, initial encounter: Secondary | ICD-10-CM

## 2019-04-04 DIAGNOSIS — I10 Essential (primary) hypertension: Secondary | ICD-10-CM | POA: Diagnosis not present

## 2019-04-04 DIAGNOSIS — S0990XA Unspecified injury of head, initial encounter: Secondary | ICD-10-CM | POA: Diagnosis not present

## 2019-04-04 DIAGNOSIS — E1122 Type 2 diabetes mellitus with diabetic chronic kidney disease: Secondary | ICD-10-CM | POA: Diagnosis not present

## 2019-04-04 DIAGNOSIS — Z8701 Personal history of pneumonia (recurrent): Secondary | ICD-10-CM | POA: Diagnosis not present

## 2019-04-04 DIAGNOSIS — J9621 Acute and chronic respiratory failure with hypoxia: Secondary | ICD-10-CM | POA: Diagnosis not present

## 2019-04-04 DIAGNOSIS — Y998 Other external cause status: Secondary | ICD-10-CM | POA: Insufficient documentation

## 2019-04-04 DIAGNOSIS — Z66 Do not resuscitate: Secondary | ICD-10-CM | POA: Diagnosis not present

## 2019-04-04 NOTE — ED Notes (Addendum)
ED Provider at bedside. Placing staples in back of head.

## 2019-04-04 NOTE — ED Provider Notes (Signed)
[facility] Provider Note   CSN: [id number] Arrival date & time: 04/04/19  1456    History   Chief Complaint Chief Complaint  Patient presents with  . Head Injury    HPI Samantha Mckenzie is a 83 y.o. female.     Patient fell while using her walker earlier this morning.  Has laceration to the back of the head.  Hemostatic.  Did not lose consciousness.  Patient is on a blood thinner.  The history is provided by the patient.  Head Injury Location:  Occipital Time since incident:  13 hours Mechanism of injury: fall   Fall:    Entrapped after fall: no   Pain details:    Quality:  Aching   Progression:  Resolved Chronicity:  New Relieved by:  Nothing Worsened by:  Nothing Associated symptoms: headache   Associated symptoms: no difficulty breathing, no double vision, no loss of consciousness, no seizures and no vomiting     Past Medical History:  Diagnosis Date  . Allergic rhinitis   . Anemia   . Breast CA ([place])    surgery, chemo, XRT; had peripheral neuropathy (imbalance at times) after chemo  . Cellulitis    Left arm, recurrent   . Cellulitis LEFT arm recurrent 12/08/2011  . Chronic renal disease, stage IV (HCC)    Dr. Moshe Cipro  . Depression   . Fatigue   . GERD (gastroesophageal reflux disease)    gastritis, EGD 02/2007  . Gout   . Hyperlipidemia   . Hypertension   . Macular degeneration 2011  . Normal cardiac stress test 12/2014  . Osteoarthritis   . Renal insufficiency     Patient Active Problem List   Diagnosis Date Noted  . Adjustment disorder with depressed mood   . Generalized abdominal pain   . Dysphasia 03/21/2019  . CAP (community acquired pneumonia) 03/20/2019  . Nausea vomiting and diarrhea 03/20/2019  . Acute respiratory failure with hypoxia ([place]) 03/20/2019  . CKD (chronic kidney disease), stage IV ([place]) 03/20/2019  . AKI (acute kidney injury) ([place]) 03/20/2019  . Lactic acidosis 03/20/2019  . Diabetic  peripheral neuropathy associated with type 2 diabetes mellitus ([place]) 03/20/2019  . Hypomagnesemia 06/21/2018  . PCP NOTES >>> 07/11/2015  . Depression   . VITAMIN B12 DEFICIENCY 04/01/2008  . GERD 12/19/2007  . NEOP, MALIGNANT, FEMALE BREAST NOS 07/07/2007  . RENAL INSUFFICIENCY 07/07/2007  . Osteoarthritis 07/07/2007  . Diabetes mellitus type 2 with complications ([place]) 20/94/7096  . Hyperlipidemia 02/28/2007  . GOUT 02/28/2007  . Normocytic anemia 02/28/2007  . Essential hypertension 02/28/2007    Past Surgical History:  Procedure Laterality Date  . cataracts bilaterally  1 -2012  . LUNG BIOPSY  1999   neg  . MASTECTOMY Left 1999   and lymphnodes      OB History   No obstetric history on file.      Home Medications    Prior to Admission medications   Medication Sig Start Date End Date Taking? Authorizing Provider  allopurinol (ZYLOPRIM) 100 MG tablet Take 1 tablet (100 mg total) by mouth daily. 03/19/19   Colon Branch, MD  amLODipine (NORVASC) 10 MG tablet Take 1 tablet (10 mg total) by mouth daily. 03/06/19   Colon Branch, MD  aspirin 81 MG tablet Take 81 mg by mouth daily.      [provider]  atorvastatin (LIPITOR) 20 MG tablet Take 1 tablet (20 mg total) by mouth daily. 01/31/19  Colon Branch, MD  Cholecalciferol (VITAMIN D3) 1000 UNITS CAPS Take 1 capsule by mouth daily.     [provider]  dicyclomine (BENTYL) 10 MG capsule Take 2 capsules (20 mg total) by mouth 4 (four) times daily -  before meals and at bedtime for 30 days. 03/27/19 04/26/19  Cristal Ford, DO  furosemide (LASIX) 40 MG tablet Take 40 mg by mouth 2 (two) times daily. 03/16/19   [provider]  gabapentin (NEURONTIN) 100 MG capsule Take 2 capsules (200 mg total) by mouth at bedtime. 12/22/18   Colon Branch, MD  metoprolol tartrate (LOPRESSOR) 25 MG tablet Take 1 tablet (25 mg total) by mouth 2 (two) times daily. 07/11/18   Colon Branch, MD  mirtazapine (REMERON) 15 MG tablet Take  2 tablets (30 mg total) by mouth at bedtime. 04/03/19   Colon Branch, MD  Multiple Vitamins-Minerals (CENTRUM SILVER PO) Take 1 tablet by mouth daily.     [provider]  ondansetron (ZOFRAN ODT) 4 MG disintegrating tablet Take 1 tablet (4 mg total) by mouth every 8 (eight) hours as needed for nausea or vomiting. 03/27/19   Mikhail, Velta Addison, DO  ondansetron (ZOFRAN) 4 MG tablet Take 1 tablet (4 mg total) by mouth 3 (three) times daily with meals. 03/27/19   Mikhail, Velta Addison, DO  pantoprazole (PROTONIX) 40 MG tablet Take 1 tablet (40 mg total) by mouth daily. 03/27/19   Mikhail, Velta Addison, DO  vitamin B-12 (CYANOCOBALAMIN) 1000 MCG tablet Take 1,000 mcg by mouth daily.      [provider]    Family History Family History  Problem Relation Age of Onset  . Breast cancer Other        ? of   . Heart attack Father 74  . Diabetes Sister        2  . Hypertension Sister   . Colon cancer Neg Hx   . Stomach cancer Neg Hx     Social History Social History   Tobacco Use  . Smoking status: Never Smoker  . Smokeless tobacco: Never Used  Substance Use Topics  . Alcohol use: Yes    Alcohol/week: 0.0 standard drinks    Comment: rarely  . Drug use: No     Allergies   Codeine, Morphine, Oxycodone-acetaminophen, Sulfonamide derivatives, and Tramadol hcl   Review of Systems Review of Systems  Constitutional: Negative for chills and fever.  HENT: Negative for ear pain and sore throat.   Eyes: Negative for double vision, pain and visual disturbance.  Respiratory: Negative for cough and shortness of breath.   Cardiovascular: Negative for chest pain and palpitations.  Gastrointestinal: Negative for abdominal pain and vomiting.  Genitourinary: Negative for dysuria and hematuria.  Musculoskeletal: Negative for arthralgias and back pain.  Skin: Positive for wound. Negative for color change and rash.  Neurological: Positive for headaches. Negative for seizures, loss of consciousness  and syncope.  All other systems reviewed and are negative.    Physical Exam Updated Vital Signs BP (!) 132/53 (BP Location: Right Arm)   Pulse 87   Temp 99.1 F (37.3 C) (Oral)   Resp 16   Ht 5' 2"  (1.575 m)   Wt 58.1 kg   SpO2 94%   BMI 23.41 kg/m   Physical Exam Vitals signs and nursing note reviewed.  Constitutional:      General: She is not in acute distress.    Appearance: She is well-developed.  HENT:     Right Ear:  Tympanic membrane normal.     Left Ear: Tympanic membrane normal.     Nose: Nose normal.     Mouth/Throat:     Mouth: Mucous membranes are moist.  Eyes:     Extraocular Movements: Extraocular movements intact.     Conjunctiva/sclera: Conjunctivae normal.     Pupils: Pupils are equal, round, and reactive to light.  Neck:     Musculoskeletal: Normal range of motion and neck supple. Muscular tenderness present.  Cardiovascular:     Rate and Rhythm: Normal rate and regular rhythm.     Heart sounds: No murmur.  Pulmonary:     Effort: Pulmonary effort is normal. No respiratory distress.     Breath sounds: Normal breath sounds.  Abdominal:     Palpations: Abdomen is soft.     Tenderness: There is no abdominal tenderness.  Musculoskeletal:        General: No tenderness.  Skin:    General: Skin is warm and dry.     Capillary Refill: Capillary refill takes less than 2 seconds.     Comments: 3 cm laceration to posterior scalp   Neurological:     General: No focal deficit present.     Mental Status: She is alert and oriented to person, place, and time.     Cranial Nerves: No cranial nerve deficit.     Sensory: No sensory deficit.     Motor: No weakness.      ED Treatments / Results  Labs (all labs ordered are listed, but only abnormal results are displayed) Labs Reviewed - No data to display  EKG None  Radiology No results found.  Procedures .Marland KitchenLaceration Repair  Date/Time: 04/04/2019 3:55 PM Performed by: Lennice Sites, DO Authorized  by: Lennice Sites, DO   Consent:    Consent obtained:  Verbal   Consent given by:  Patient   Risks discussed:  Infection, nerve damage, need for additional repair, pain, poor cosmetic result, poor wound healing and retained foreign body   Alternatives discussed:  No treatment Anesthesia (see MAR for exact dosages):    Anesthesia method:  None Laceration details:    Length (cm):  3 Repair type:    Repair type:  Simple Pre-procedure details:    Preparation:  Patient was prepped and draped in usual sterile fashion Treatment:    Area cleansed with:  Saline   Amount of cleaning:  Standard   Irrigation solution:  Sterile saline   Visualized foreign bodies/material removed: no   Skin repair:    Repair method:  Staples   Number of staples:  2 Approximation:    Approximation:  Close Post-procedure details:    Dressing:  Open (no dressing)   Patient tolerance of procedure:  Tolerated well, no immediate complications   (including critical care time)  Medications Ordered in ED Medications - No data to display   Initial Impression / Assessment and Plan / ED Course  I have reviewed the triage vital signs and the nursing notes.  Pertinent labs & imaging results that were available during my care of the patient were reviewed by me and considered in my medical decision making (see chart for details).     Samantha Mckenzie is an 83 year old female with history of renal disease, hypertension, high cholesterol who presents the ED with head laceration.  Patient had a fall about 12 hours ago while using her walker.  She states that the walker made her fall.  Did not lose consciousness.  Patient had  home aide come today and they noticed that she had a laceration to the back of the head that she had not been able to notice.  Wound is hemostatic.  Was closed with 2 staples.  CT of the head unremarkable.  Patient with no midline spinal pain.  Discharged in ED in good condition.  Given return  precautions.  This chart was dictated using voice recognition software.  Despite best efforts to proofread,  errors can occur which can change the documentation meaning.    Final Clinical Impressions(s) / ED Diagnoses   Final diagnoses:  Laceration of head without foreign body, unspecified part of head, initial encounter    ED Discharge Orders    None       Lennice Sites, DO 04/04/19 1558

## 2019-04-04 NOTE — Telephone Encounter (Signed)
Please advise 

## 2019-04-04 NOTE — Telephone Encounter (Signed)
Samantha Mckenzie w/[facility] [phone] would like an order for a Home Health Aid.

## 2019-04-04 NOTE — Telephone Encounter (Signed)
Agree, thank you

## 2019-04-04 NOTE — Assessment & Plan Note (Signed)
TCM 7 Extensive chart review.  Most recent labs: Potassium 3.1, creatinine 2.1,Hemoglobin 10.0. Community-acquired pneumonia, respiratory failure: Admitted to the hospital with abdominal pain, nausea, vomiting.  Found to be hypoxic. Chest x-ray showed cardiomegaly and evidence of volume overload however CT of the abdomen showed pneumonia.  She completed the antibiotic treatment in-house, was hypoxic for few additional days, eventually discharged home without oxygen.  She reports today no fever chills but is still have some cough. Plan: CMP, CBC, chest x-ray. Check O2 sat when she comes for a nurse visit Abdominal pain: Work-up negative, saw GI, IBS?  Symptoms about the same.  Observation for now HTN: Currently on metoprolol, amlodipine, Lasix.  Checking labs.  Will check vital signs when she comes for a nurse visit  CRI: Checking labs Depression: I believe this is the main driver of how the patient feels, she strongly denies uicidal ideas.  Feels that Remeron 41m daily is helping.  Will increase to 30 mg.  Encouraged to see a counselor, will send a letter with information DM: Due for A1c, diet controlled F/U Nurse visit in few days, labs, BPs, O2 sat check, chest x-ray Follow-up with me 5 to 6 weeks.

## 2019-04-04 NOTE — Telephone Encounter (Signed)
Home health (OT) went out to see the patient today. The patient had a fall at home around 3 am and hit her head. OT needed verbal from PCP Office ok to send to the ED. The patients son is taking her to the ED. Will send message to PCP.

## 2019-04-04 NOTE — Telephone Encounter (Signed)
Okay, I will review the ED notes when ready

## 2019-04-04 NOTE — ED Triage Notes (Signed)
Pt fell this morning at 3am while going to the bathroom using her walker.  Sts her walker seemed to "give out".  Fell backward, hitting back of head on dresser.  1inch lac to back of head. Denies LOC.

## 2019-04-04 NOTE — ED Notes (Signed)
D/c papers reviewed with pt. Son on the way to pick pt up and this RN will review papers with him as well.

## 2019-04-04 NOTE — Discharge Instructions (Addendum)
Staples should be removed in 7 to 10 days.

## 2019-04-04 NOTE — Telephone Encounter (Signed)
Spoke w/ Erlene Quan- verbal orders given.

## 2019-04-05 NOTE — Telephone Encounter (Signed)
Samantha Mckenzie- can you contact Pt to schedule for 1 week please- in person.

## 2019-04-05 NOTE — Telephone Encounter (Signed)
At the ER she got staples on the scalp, they need to come out in 1 week.  Please schedule office visit for that purpose. We can actually do her labs at that time.

## 2019-04-06 DIAGNOSIS — Z8701 Personal history of pneumonia (recurrent): Secondary | ICD-10-CM | POA: Diagnosis not present

## 2019-04-06 DIAGNOSIS — F4321 Adjustment disorder with depressed mood: Secondary | ICD-10-CM | POA: Diagnosis not present

## 2019-04-06 DIAGNOSIS — I129 Hypertensive chronic kidney disease with stage 1 through stage 4 chronic kidney disease, or unspecified chronic kidney disease: Secondary | ICD-10-CM | POA: Diagnosis not present

## 2019-04-06 DIAGNOSIS — E1142 Type 2 diabetes mellitus with diabetic polyneuropathy: Secondary | ICD-10-CM | POA: Diagnosis not present

## 2019-04-06 DIAGNOSIS — E1122 Type 2 diabetes mellitus with diabetic chronic kidney disease: Secondary | ICD-10-CM | POA: Diagnosis not present

## 2019-04-06 DIAGNOSIS — N184 Chronic kidney disease, stage 4 (severe): Secondary | ICD-10-CM | POA: Diagnosis not present

## 2019-04-07 ENCOUNTER — Emergency Department (HOSPITAL_BASED_OUTPATIENT_CLINIC_OR_DEPARTMENT_OTHER): Payer: Medicare Other

## 2019-04-07 ENCOUNTER — Encounter (HOSPITAL_BASED_OUTPATIENT_CLINIC_OR_DEPARTMENT_OTHER): Payer: Self-pay | Admitting: *Deleted

## 2019-04-07 ENCOUNTER — Other Ambulatory Visit: Payer: Self-pay

## 2019-04-07 ENCOUNTER — Inpatient Hospital Stay (HOSPITAL_BASED_OUTPATIENT_CLINIC_OR_DEPARTMENT_OTHER)
Admission: EM | Admit: 2019-04-07 | Discharge: 2019-04-12 | DRG: 391 | Disposition: A | Discharge status: Home Health | Payer: Medicare Other | Attending: Family Medicine | Admitting: Family Medicine

## 2019-04-07 DIAGNOSIS — Z882 Allergy status to sulfonamides status: Secondary | ICD-10-CM

## 2019-04-07 DIAGNOSIS — I130 Hypertensive heart and chronic kidney disease with heart failure and stage 1 through stage 4 chronic kidney disease, or unspecified chronic kidney disease: Secondary | ICD-10-CM | POA: Diagnosis present

## 2019-04-07 DIAGNOSIS — K50011 Crohn's disease of small intestine with rectal bleeding: Secondary | ICD-10-CM | POA: Diagnosis present

## 2019-04-07 DIAGNOSIS — E785 Hyperlipidemia, unspecified: Secondary | ICD-10-CM | POA: Diagnosis present

## 2019-04-07 DIAGNOSIS — E1142 Type 2 diabetes mellitus with diabetic polyneuropathy: Secondary | ICD-10-CM | POA: Diagnosis not present

## 2019-04-07 DIAGNOSIS — M109 Gout, unspecified: Secondary | ICD-10-CM | POA: Diagnosis not present

## 2019-04-07 DIAGNOSIS — Z9012 Acquired absence of left breast and nipple: Secondary | ICD-10-CM

## 2019-04-07 DIAGNOSIS — E860 Dehydration: Secondary | ICD-10-CM | POA: Diagnosis present

## 2019-04-07 DIAGNOSIS — R531 Weakness: Secondary | ICD-10-CM | POA: Diagnosis not present

## 2019-04-07 DIAGNOSIS — Z7289 Other problems related to lifestyle: Secondary | ICD-10-CM

## 2019-04-07 DIAGNOSIS — I10 Essential (primary) hypertension: Secondary | ICD-10-CM | POA: Diagnosis present

## 2019-04-07 DIAGNOSIS — S0101XA Laceration without foreign body of scalp, initial encounter: Secondary | ICD-10-CM | POA: Diagnosis present

## 2019-04-07 DIAGNOSIS — K5289 Other specified noninfective gastroenteritis and colitis: Secondary | ICD-10-CM | POA: Diagnosis not present

## 2019-04-07 DIAGNOSIS — K6389 Other specified diseases of intestine: Secondary | ICD-10-CM | POA: Diagnosis not present

## 2019-04-07 DIAGNOSIS — N179 Acute kidney failure, unspecified: Secondary | ICD-10-CM | POA: Diagnosis present

## 2019-04-07 DIAGNOSIS — D539 Nutritional anemia, unspecified: Secondary | ICD-10-CM | POA: Diagnosis present

## 2019-04-07 DIAGNOSIS — N184 Chronic kidney disease, stage 4 (severe): Secondary | ICD-10-CM | POA: Diagnosis not present

## 2019-04-07 DIAGNOSIS — R195 Other fecal abnormalities: Secondary | ICD-10-CM | POA: Diagnosis present

## 2019-04-07 DIAGNOSIS — I5032 Chronic diastolic (congestive) heart failure: Secondary | ICD-10-CM | POA: Diagnosis present

## 2019-04-07 DIAGNOSIS — R1084 Generalized abdominal pain: Secondary | ICD-10-CM | POA: Diagnosis not present

## 2019-04-07 DIAGNOSIS — K8020 Calculus of gallbladder without cholecystitis without obstruction: Secondary | ICD-10-CM | POA: Diagnosis present

## 2019-04-07 DIAGNOSIS — Z8701 Personal history of pneumonia (recurrent): Secondary | ICD-10-CM | POA: Diagnosis not present

## 2019-04-07 DIAGNOSIS — Z8249 Family history of ischemic heart disease and other diseases of the circulatory system: Secondary | ICD-10-CM

## 2019-04-07 DIAGNOSIS — J9601 Acute respiratory failure with hypoxia: Secondary | ICD-10-CM | POA: Diagnosis present

## 2019-04-07 DIAGNOSIS — J309 Allergic rhinitis, unspecified: Secondary | ICD-10-CM | POA: Diagnosis present

## 2019-04-07 DIAGNOSIS — R296 Repeated falls: Secondary | ICD-10-CM | POA: Diagnosis present

## 2019-04-07 DIAGNOSIS — Z20828 Contact with and (suspected) exposure to other viral communicable diseases: Secondary | ICD-10-CM | POA: Diagnosis present

## 2019-04-07 DIAGNOSIS — J449 Chronic obstructive pulmonary disease, unspecified: Secondary | ICD-10-CM | POA: Diagnosis present

## 2019-04-07 DIAGNOSIS — J9621 Acute and chronic respiratory failure with hypoxia: Secondary | ICD-10-CM | POA: Diagnosis present

## 2019-04-07 DIAGNOSIS — E1122 Type 2 diabetes mellitus with diabetic chronic kidney disease: Secondary | ICD-10-CM | POA: Diagnosis present

## 2019-04-07 DIAGNOSIS — R112 Nausea with vomiting, unspecified: Secondary | ICD-10-CM | POA: Diagnosis not present

## 2019-04-07 DIAGNOSIS — Z923 Personal history of irradiation: Secondary | ICD-10-CM

## 2019-04-07 DIAGNOSIS — Z9221 Personal history of antineoplastic chemotherapy: Secondary | ICD-10-CM

## 2019-04-07 DIAGNOSIS — Z803 Family history of malignant neoplasm of breast: Secondary | ICD-10-CM

## 2019-04-07 DIAGNOSIS — R197 Diarrhea, unspecified: Secondary | ICD-10-CM

## 2019-04-07 DIAGNOSIS — E876 Hypokalemia: Secondary | ICD-10-CM | POA: Diagnosis present

## 2019-04-07 DIAGNOSIS — Z515 Encounter for palliative care: Secondary | ICD-10-CM | POA: Diagnosis not present

## 2019-04-07 DIAGNOSIS — Z66 Do not resuscitate: Secondary | ICD-10-CM | POA: Diagnosis present

## 2019-04-07 DIAGNOSIS — D509 Iron deficiency anemia, unspecified: Secondary | ICD-10-CM | POA: Diagnosis present

## 2019-04-07 DIAGNOSIS — Z7189 Other specified counseling: Secondary | ICD-10-CM | POA: Diagnosis not present

## 2019-04-07 DIAGNOSIS — E118 Type 2 diabetes mellitus with unspecified complications: Secondary | ICD-10-CM

## 2019-04-07 DIAGNOSIS — F4321 Adjustment disorder with depressed mood: Secondary | ICD-10-CM | POA: Diagnosis not present

## 2019-04-07 DIAGNOSIS — Z885 Allergy status to narcotic agent status: Secondary | ICD-10-CM

## 2019-04-07 DIAGNOSIS — F329 Major depressive disorder, single episode, unspecified: Secondary | ICD-10-CM | POA: Diagnosis not present

## 2019-04-07 DIAGNOSIS — Z7982 Long term (current) use of aspirin: Secondary | ICD-10-CM

## 2019-04-07 DIAGNOSIS — K529 Noninfective gastroenteritis and colitis, unspecified: Secondary | ICD-10-CM | POA: Diagnosis present

## 2019-04-07 DIAGNOSIS — F32A Depression, unspecified: Secondary | ICD-10-CM | POA: Diagnosis present

## 2019-04-07 DIAGNOSIS — K219 Gastro-esophageal reflux disease without esophagitis: Secondary | ICD-10-CM | POA: Diagnosis present

## 2019-04-07 DIAGNOSIS — Z79899 Other long term (current) drug therapy: Secondary | ICD-10-CM

## 2019-04-07 DIAGNOSIS — I129 Hypertensive chronic kidney disease with stage 1 through stage 4 chronic kidney disease, or unspecified chronic kidney disease: Secondary | ICD-10-CM | POA: Diagnosis not present

## 2019-04-07 DIAGNOSIS — Z888 Allergy status to other drugs, medicaments and biological substances status: Secondary | ICD-10-CM

## 2019-04-07 DIAGNOSIS — Z853 Personal history of malignant neoplasm of breast: Secondary | ICD-10-CM

## 2019-04-07 DIAGNOSIS — W19XXXA Unspecified fall, initial encounter: Secondary | ICD-10-CM | POA: Diagnosis present

## 2019-04-07 LAB — IRON AND TIBC
Iron: 17 ug/dL — ABNORMAL LOW (ref 28–170)
Saturation Ratios: 10 % — ABNORMAL LOW (ref 10.4–31.8)
TIBC: 168 ug/dL — ABNORMAL LOW (ref 250–450)
UIBC: 151 ug/dL

## 2019-04-07 LAB — URINALYSIS, ROUTINE W REFLEX MICROSCOPIC
Bilirubin Urine: NEGATIVE
Glucose, UA: NEGATIVE mg/dL
Hgb urine dipstick: NEGATIVE
Ketones, ur: NEGATIVE mg/dL
Leukocytes,Ua: NEGATIVE
Nitrite: NEGATIVE
Protein, ur: NEGATIVE mg/dL
Specific Gravity, Urine: 1.004 — ABNORMAL LOW (ref 1.005–1.030)
pH: 6 (ref 5.0–8.0)

## 2019-04-07 LAB — PROTIME-INR
INR: 1.1 (ref 0.8–1.2)
Prothrombin Time: 14.1 seconds (ref 11.4–15.2)

## 2019-04-07 LAB — FERRITIN: Ferritin: 429 ng/mL — ABNORMAL HIGH (ref 11–307)

## 2019-04-07 LAB — CBC WITH DIFFERENTIAL/PLATELET
Abs Immature Granulocytes: 0.05 10*3/uL (ref 0.00–0.07)
Basophils Absolute: 0 10*3/uL (ref 0.0–0.1)
Basophils Relative: 0 %
Eosinophils Absolute: 0.1 10*3/uL (ref 0.0–0.5)
Eosinophils Relative: 2 %
HCT: 22 % — ABNORMAL LOW (ref 36.0–46.0)
Hemoglobin: 7.3 g/dL — ABNORMAL LOW (ref 12.0–15.0)
Immature Granulocytes: 1 %
Lymphocytes Relative: 14 %
Lymphs Abs: 0.7 10*3/uL (ref 0.7–4.0)
MCH: 29.6 pg (ref 26.0–34.0)
MCHC: 33.2 g/dL (ref 30.0–36.0)
MCV: 89.1 fL (ref 80.0–100.0)
Monocytes Absolute: 0.4 10*3/uL (ref 0.1–1.0)
Monocytes Relative: 8 %
Neutro Abs: 3.6 10*3/uL (ref 1.7–7.7)
Neutrophils Relative %: 75 %
Platelets: 377 10*3/uL (ref 150–400)
RBC: 2.47 MIL/uL — ABNORMAL LOW (ref 3.87–5.11)
RDW: 14 % (ref 11.5–15.5)
WBC: 4.7 10*3/uL (ref 4.0–10.5)
nRBC: 0 % (ref 0.0–0.2)

## 2019-04-07 LAB — APTT: aPTT: 30 seconds (ref 24–36)

## 2019-04-07 LAB — OCCULT BLOOD X 1 CARD TO LAB, STOOL: Fecal Occult Bld: POSITIVE — AB

## 2019-04-07 LAB — LIPASE, BLOOD: Lipase: 38 U/L (ref 11–51)

## 2019-04-07 LAB — VITAMIN B12: Vitamin B-12: 5300 pg/mL — ABNORMAL HIGH (ref 180–914)

## 2019-04-07 LAB — MAGNESIUM: Magnesium: 1.3 mg/dL — ABNORMAL LOW (ref 1.7–2.4)

## 2019-04-07 LAB — BRAIN NATRIURETIC PEPTIDE: B Natriuretic Peptide: 294.1 pg/mL — ABNORMAL HIGH (ref 0.0–100.0)

## 2019-04-07 LAB — SARS CORONAVIRUS 2 AG (30 MIN TAT): SARS Coronavirus 2 Ag: NEGATIVE

## 2019-04-07 MED ORDER — SODIUM CHLORIDE 0.9 % IV BOLUS
500.0000 mL | Freq: Once | INTRAVENOUS | Status: AC
  Administered 2019-04-07: 500 mL via INTRAVENOUS

## 2019-04-07 MED ORDER — ACETAMINOPHEN 325 MG PO TABS: 650.0000 mg | ORAL_TABLET | Freq: Four times a day (QID) | ORAL | Status: DC | PRN

## 2019-04-07 MED ORDER — POTASSIUM CHLORIDE 10 MEQ/100ML IV SOLN
10.0000 meq | INTRAVENOUS | Status: AC
  Administered 2019-04-07 (×3): 10 meq via INTRAVENOUS
  Filled 2019-04-07 (×3): qty 100

## 2019-04-07 MED ORDER — PANTOPRAZOLE SODIUM 40 MG PO TBEC
40.0000 mg | DELAYED_RELEASE_TABLET | Freq: Two times a day (BID) | ORAL | Status: DC
  Administered 2019-04-08 – 2019-04-12 (×10): 40 mg via ORAL
  Filled 2019-04-07 (×10): qty 1

## 2019-04-07 MED ORDER — HYDRALAZINE HCL 20 MG/ML IJ SOLN: 5.0000 mg | INTRAMUSCULAR | Status: DC | PRN

## 2019-04-07 MED ORDER — FUROSEMIDE 40 MG PO TABS: 40.0000 mg | ORAL_TABLET | Freq: Two times a day (BID) | ORAL | Status: DC

## 2019-04-07 MED ORDER — AMLODIPINE BESYLATE 10 MG PO TABS
10.0000 mg | ORAL_TABLET | Freq: Every day | ORAL | Status: DC
  Administered 2019-04-08 – 2019-04-12 (×5): 10 mg via ORAL
  Filled 2019-04-07 (×5): qty 1

## 2019-04-07 MED ORDER — ACETAMINOPHEN 650 MG RE SUPP: 650.0000 mg | Freq: Four times a day (QID) | RECTAL | Status: DC | PRN

## 2019-04-07 MED ORDER — ONDANSETRON 4 MG PO TBDP: 4.0000 mg | ORAL_TABLET | Freq: Three times a day (TID) | ORAL | Status: DC | PRN

## 2019-04-07 MED ORDER — CALCIUM GLUCONATE-NACL 2-0.675 GM/100ML-% IV SOLN
2.0000 g | Freq: Once | INTRAVENOUS | Status: AC
  Administered 2019-04-07: 2000 mg via INTRAVENOUS
  Filled 2019-04-07: qty 100

## 2019-04-07 MED ORDER — MAGNESIUM SULFATE 2 GM/50ML IV SOLN
2.0000 g | Freq: Once | INTRAVENOUS | Status: AC
  Administered 2019-04-07: 2 g via INTRAVENOUS
  Filled 2019-04-07: qty 50

## 2019-04-07 MED ORDER — ATORVASTATIN CALCIUM 10 MG PO TABS
20.0000 mg | ORAL_TABLET | Freq: Every day | ORAL | Status: DC
  Administered 2019-04-08 – 2019-04-12 (×5): 20 mg via ORAL
  Filled 2019-04-07 (×2): qty 2
  Filled 2019-04-07: qty 1
  Filled 2019-04-07 (×3): qty 2

## 2019-04-07 MED ORDER — ALBUTEROL SULFATE (2.5 MG/3ML) 0.083% IN NEBU: 2.5000 mg | INHALATION_SOLUTION | RESPIRATORY_TRACT | Status: DC | PRN

## 2019-04-07 MED ORDER — VITAMIN B-12 1000 MCG PO TABS
1000.0000 ug | ORAL_TABLET | Freq: Every day | ORAL | Status: DC
  Administered 2019-04-08 – 2019-04-12 (×5): 1000 ug via ORAL
  Filled 2019-04-07 (×6): qty 1

## 2019-04-07 MED ORDER — MIRTAZAPINE 15 MG PO TABS
30.0000 mg | ORAL_TABLET | Freq: Every day | ORAL | Status: DC
  Filled 2019-04-07: qty 1
  Filled 2019-04-07: qty 2

## 2019-04-07 MED ORDER — ONDANSETRON HCL 4 MG PO TABS
4.0000 mg | ORAL_TABLET | Freq: Three times a day (TID) | ORAL | Status: DC
  Administered 2019-04-08 – 2019-04-12 (×6): 4 mg via ORAL
  Filled 2019-04-07 (×9): qty 1

## 2019-04-07 MED ORDER — PIPERACILLIN-TAZOBACTAM 3.375 G IVPB 30 MIN
3.3750 g | Freq: Once | INTRAVENOUS | Status: AC
  Administered 2019-04-07: 3.375 g via INTRAVENOUS
  Filled 2019-04-07 (×2): qty 50

## 2019-04-07 MED ORDER — POTASSIUM CHLORIDE CRYS ER 20 MEQ PO TBCR
40.0000 meq | EXTENDED_RELEASE_TABLET | ORAL | Status: AC
  Administered 2019-04-07 – 2019-04-08 (×2): 40 meq via ORAL
  Filled 2019-04-07 (×2): qty 2

## 2019-04-07 MED ORDER — GABAPENTIN 100 MG PO CAPS
200.0000 mg | ORAL_CAPSULE | Freq: Every day | ORAL | Status: DC
  Administered 2019-04-07 – 2019-04-11 (×5): 200 mg via ORAL
  Filled 2019-04-07 (×5): qty 2

## 2019-04-07 MED ORDER — ALLOPURINOL 100 MG PO TABS
100.0000 mg | ORAL_TABLET | Freq: Every day | ORAL | Status: DC
  Administered 2019-04-08 – 2019-04-12 (×5): 100 mg via ORAL
  Filled 2019-04-07 (×6): qty 1

## 2019-04-07 MED ORDER — PIPERACILLIN-TAZOBACTAM IN DEX 2-0.25 GM/50ML IV SOLN
2.2500 g | Freq: Three times a day (TID) | INTRAVENOUS | Status: DC
  Administered 2019-04-08 – 2019-04-12 (×14): 2.25 g via INTRAVENOUS
  Filled 2019-04-07 (×16): qty 50

## 2019-04-07 MED ORDER — ASPIRIN EC 81 MG PO TBEC: 81.0000 mg | DELAYED_RELEASE_TABLET | Freq: Every day | ORAL | Status: DC

## 2019-04-07 MED ORDER — DM-GUAIFENESIN ER 30-600 MG PO TB12: 1.0000 | ORAL_TABLET | Freq: Two times a day (BID) | ORAL | Status: DC | PRN

## 2019-04-07 MED ORDER — HYDROXYZINE HCL 10 MG PO TABS
10.0000 mg | ORAL_TABLET | Freq: Three times a day (TID) | ORAL | Status: DC | PRN
  Administered 2019-04-10: 10 mg via ORAL
  Filled 2019-04-07 (×2): qty 1

## 2019-04-07 MED ORDER — ALBUTEROL SULFATE HFA 108 (90 BASE) MCG/ACT IN AERS: 2.0000 | INHALATION_SPRAY | RESPIRATORY_TRACT | Status: DC | PRN

## 2019-04-07 MED ORDER — PANTOPRAZOLE SODIUM 40 MG PO TBEC: 40.0000 mg | DELAYED_RELEASE_TABLET | Freq: Every day | ORAL | Status: DC

## 2019-04-07 MED ORDER — METOPROLOL TARTRATE 25 MG PO TABS
25.0000 mg | ORAL_TABLET | Freq: Two times a day (BID) | ORAL | Status: DC
  Administered 2019-04-07 – 2019-04-12 (×9): 25 mg via ORAL
  Filled 2019-04-07 (×10): qty 1

## 2019-04-07 NOTE — ED Notes (Signed)
Placed in hospital gown.  Patient reports she still doesn't feel the need to urinate due to using the bathroom prior to arrival.

## 2019-04-07 NOTE — ED Provider Notes (Signed)
[facility] EMERGENCY DEPARTMENT Provider Note   CSN: [id number] Arrival date & time: 04/07/19  1525    History   Chief Complaint Chief Complaint  Patient presents with   Weakness    multiple falls    HPI Samantha Mckenzie is a 83 y.o. female.     Patient is a 82 year old female who presents with generalized weakness.  She was admitted recently to [facility] and discharged on June 16.  She was therefore pneumonia and abdominal pain.  She did have a CT scan of her abdomen at that point which showed the pneumonia but otherwise was unremarkable.  She did have some gallstones but no suggestions of cholecystitis.  Over the last several days she is gotten progressively weaker.  Her son states that she has been weak since she left the hospital.  She was seen here on June 24 after a fall which at that time sounded mechanical in nature although her son states that she has had several falls due to weakness.  She reports generalized weakness and fatigue.  No unilateral symptoms.  No slurred speech.  No known fevers.  No cough or cold symptoms.  She still has ongoing abdominal pain.  She occasionally has some loose stools but has not been eating much.  She has some intermittent nausea but no vomiting.  She lives at home with her husband and her son states that normally she is completely independent and drives and is very active.  This apparently is a big change from her baseline.  He is also noticed some mild confusion since this recent hospitalization.     Past Medical History:  Diagnosis Date   Allergic rhinitis    Anemia    Breast CA ([place])    surgery, chemo, XRT; had peripheral neuropathy (imbalance at times) after chemo   Cellulitis    Left arm, recurrent    Cellulitis LEFT arm recurrent 12/08/2011   Chronic renal disease, stage IV ([place])    Dr. Moshe Cipro   Depression    Fatigue    GERD (gastroesophageal reflux disease)    gastritis, EGD 02/2007   Gout     Hyperlipidemia    Hypertension    Macular degeneration 2011   Normal cardiac stress test 12/2014   Osteoarthritis    Renal insufficiency     Patient Active Problem List   Diagnosis Date Noted   Enteritis 04/07/2019   Adjustment disorder with depressed mood    Generalized abdominal pain    Dysphasia 03/21/2019   CAP (community acquired pneumonia) 03/20/2019   Nausea vomiting and diarrhea 03/20/2019   Acute respiratory failure with hypoxia ([place]) 03/20/2019   CKD (chronic kidney disease), stage IV ([place]) 03/20/2019   Lactic acidosis 03/20/2019   Diabetic peripheral neuropathy associated with type 2 diabetes mellitus ([place]) 03/20/2019   Hypomagnesemia 06/21/2018   PCP NOTES >>> 07/11/2015   Depression    VITAMIN B12 DEFICIENCY 04/01/2008   GERD 12/19/2007   NEOP, MALIGNANT, FEMALE BREAST NOS 07/07/2007   Renal insufficiency 07/07/2007   Osteoarthritis 07/07/2007   Diabetes mellitus type 2 with complications ([place]) 54/00/8676   Hyperlipidemia 02/28/2007   GOUT 02/28/2007   Normocytic anemia 02/28/2007   Essential hypertension 02/28/2007    Past Surgical History:  Procedure Laterality Date   cataracts bilaterally  1 -2012   LUNG BIOPSY  1999   neg   MASTECTOMY Left 1999   and lymphnodes      OB History   No obstetric history on  file.      Home Medications    Prior to Admission medications   Medication Sig Start Date End Date Taking? Authorizing Provider  allopurinol (ZYLOPRIM) 100 MG tablet Take 1 tablet (100 mg total) by mouth daily. 03/19/19  Yes Paz, Alda Berthold, MD  amLODipine (NORVASC) 10 MG tablet Take 1 tablet (10 mg total) by mouth daily. 03/06/19  Yes Colon [place], MD  aspirin 81 MG tablet Take 81 mg by mouth daily.     Yes [provider]  atorvastatin (LIPITOR) 20 MG tablet Take 1 tablet (20 mg total) by mouth daily. 01/31/19  Yes Paz, Alda Berthold, MD  Cholecalciferol (VITAMIN D3) 1000 UNITS CAPS Take 1 capsule by mouth daily.    Yes  [provider]  furosemide (LASIX) 40 MG tablet Take 40 mg by mouth 2 (two) times daily. 03/16/19  Yes [provider]  gabapentin (NEURONTIN) 100 MG capsule Take 2 capsules (200 mg total) by mouth at bedtime. 12/22/18  Yes Paz, Alda Berthold, MD  metoprolol tartrate (LOPRESSOR) 25 MG tablet Take 1 tablet (25 mg total) by mouth 2 (two) times daily. 07/11/18  Yes Paz, Alda Berthold, MD  mirtazapine (REMERON) 15 MG tablet Take 2 tablets (30 mg total) by mouth at bedtime. 04/03/19  Yes Paz, Alda Berthold, MD  Multiple Vitamins-Minerals (CENTRUM SILVER PO) Take 1 tablet by mouth daily.    Yes [provider]  pantoprazole (PROTONIX) 40 MG tablet Take 1 tablet (40 mg total) by mouth daily. 03/27/19  Yes Mikhail, [place], DO  vitamin B-12 (CYANOCOBALAMIN) 1000 MCG tablet Take 1,000 mcg by mouth daily.     Yes [provider]  dicyclomine (BENTYL) 10 MG capsule Take 2 capsules (20 mg total) by mouth 4 (four) times daily -  before meals and at bedtime for 30 days. 03/27/19 04/26/19  Mikhail, Velta Addison, DO  ondansetron (ZOFRAN ODT) 4 MG disintegrating tablet Take 1 tablet (4 mg total) by mouth every 8 (eight) hours as needed for nausea or vomiting. 03/27/19   Mikhail, Velta Addison, DO  ondansetron (ZOFRAN) 4 MG tablet Take 1 tablet (4 mg total) by mouth 3 (three) times daily with meals. 03/27/19   Cristal Ford, DO    Family History Family History  Problem Relation Age of Onset   Breast cancer Other        ? of    Heart attack Father 64   Diabetes Sister        2   Hypertension Sister    Colon cancer Neg Hx    Stomach cancer Neg Hx     Social History Social History   Tobacco Use   Smoking status: Never Smoker   Smokeless tobacco: Never Used  Substance Use Topics   Alcohol use: Yes    Alcohol/week: 0.0 standard drinks    Comment: rarely   Drug use: No     Allergies   Codeine, Morphine, Oxycodone-acetaminophen, Sulfonamide derivatives, and Tramadol hcl   Review of  Systems Review of Systems  Constitutional: Positive for fatigue. Negative for chills, diaphoresis and fever.  HENT: Negative for congestion, rhinorrhea and sneezing.   Eyes: Negative.   Respiratory: Negative for cough, chest tightness and shortness of breath.   Cardiovascular: Negative for chest pain and leg swelling.  Gastrointestinal: Positive for abdominal pain, diarrhea and nausea. Negative for blood in stool and vomiting.  Genitourinary: Negative for difficulty urinating, flank pain, frequency and hematuria.  Musculoskeletal: Negative for arthralgias and back pain.  Skin: Negative for  rash.  Neurological: Positive for weakness. Negative for dizziness, speech difficulty, numbness and headaches.     Physical Exam Updated Vital Signs BP (!) 135/58    Pulse 92    Temp 98.7 F (37.1 C) (Oral)    Resp (!) 32    Ht 5' 3"  (1.6 m)    SpO2 98%    BMI 22.67 kg/m   Physical Exam Constitutional:      Appearance: She is well-developed.  HENT:     Head: Normocephalic and atraumatic.  Eyes:     Pupils: Pupils are equal, round, and reactive to light.  Neck:     Musculoskeletal: Normal range of motion and neck supple.  Cardiovascular:     Rate and Rhythm: Normal rate and regular rhythm.     Heart sounds: Normal heart sounds.  Pulmonary:     Effort: Pulmonary effort is normal. No respiratory distress.     Breath sounds: Normal breath sounds. No wheezing or rales.  Chest:     Chest wall: No tenderness.  Abdominal:     General: Bowel sounds are normal.     Palpations: Abdomen is soft.     Tenderness: There is abdominal tenderness in the right upper quadrant and right lower quadrant. There is no guarding or rebound.  Musculoskeletal: Normal range of motion.  Lymphadenopathy:     Cervical: No cervical adenopathy.  Skin:    General: Skin is warm and dry.     Findings: No rash.  Neurological:     General: No focal deficit present.     Mental Status: She is alert and oriented to person,  place, and time.      ED Treatments / Results  Labs (all labs ordered are listed, but only abnormal results are displayed) Labs Reviewed  COMPREHENSIVE METABOLIC PANEL - Abnormal; Notable for the following components:      Result Value   Sodium 130 (*)    Potassium 2.3 (*)    Chloride 86 (*)    Glucose, Bld 131 (*)    BUN 55 (*)    Creatinine, Ser 3.54 (*)    Calcium 6.6 (*)    Total Protein 6.2 (*)    Albumin 2.1 (*)    GFR calc non Af Amer 12 (*)    GFR calc Af Amer 13 (*)    Anion gap 16 (*)    All other components within normal limits  CBC WITH DIFFERENTIAL/PLATELET - Abnormal; Notable for the following components:   RBC 2.47 (*)    Hemoglobin 7.3 (*)    HCT 22.0 (*)    All other components within normal limits  MAGNESIUM - Abnormal; Notable for the following components:   Magnesium 1.3 (*)    All other components within normal limits  OCCULT BLOOD X 1 CARD TO LAB, STOOL - Abnormal; Notable for the following components:   Fecal Occult Bld POSITIVE (*)    All other components within normal limits  SARS CORONAVIRUS 2 (HOSP ORDER, PERFORMED IN [facility] VIA ABBOTT ID)  GASTROINTESTINAL PANEL BY PCR, STOOL (REPLACES STOOL CULTURE)  C DIFFICILE QUICK SCREEN W PCR REFLEX  LIPASE, BLOOD  URINALYSIS, ROUTINE W REFLEX MICROSCOPIC  HEMOGLOBIN O9G  BASIC METABOLIC PANEL  CBC    EKG EKG Interpretation  Date/Time:  Saturday April 07 2019 16:26:02 EDT Ventricular Rate:  85 PR Interval:    QRS Duration: 92 QT Interval:  420 QTC Calculation: 500 R Axis:   13 Text Interpretation:  Sinus  or ectopic atrial rhythm Borderline repolarization abnormality Borderline prolonged QT interval Baseline wander in lead(s) II III aVR aVL aVF V3 V5 since last tracing no significant change Confirmed by Malvin Johns [phone]) on 04/07/2019 4:31:24 PM   Radiology Ct Abdomen Pelvis Wo Contrast  Result Date: 04/07/2019 CLINICAL DATA:  Weakness, intermittent nausea and diarrhea since  03/25/2019, 4 falls, unspecified abdominal pain, history stage IV chronic renal disease, GERD, hypertension, hyperlipidemia, breast cancer EXAM: CT ABDOMEN AND PELVIS WITHOUT CONTRAST TECHNIQUE: Multidetector CT imaging of the abdomen and pelvis was performed following the standard protocol without IV contrast. Sagittal and coronal MPR images reconstructed from axial data set. No oral contrast was administered COMPARISON:  03/19/2019 FINDINGS: Lower chest: Bibasilar atelectasis with questionable coexistent consolidation RIGHT lower lobe. Hepatobiliary: Gallbladder and liver normal appearance Pancreas: Atrophic pancreas without mass Spleen: Normal size with a few scattered calcified granulomata. Adrenals/Urinary Tract: Adrenal glands normal appearance. Minimally dilated renal pelves bilaterally without hydroureter or ureteral calcification. 2.2 x 2.3 cm exophytic RIGHT renal cyst. Intermediate attenuation 11 mm nodule posterior LEFT kidney image 33 versus lobulated cortex unchanged. Ureters and bladder otherwise unremarkable. Stomach/Bowel: Sigmoid diverticulosis without evidence of diverticulitis. Appendix not definitely visualized. Loop of small bowel in the RIGHT upper lateral pelvis demonstrates mild wall thickening and surrounding inflammatory changes compatible with enteritis. Several tiny foci of gas are seen either within or immediately adjacent to the small bowel wall, cannot exclude microperforation. Small bowel proximal to this is upper normal in caliber and demonstrates a small bowel stool sign suggesting prolonged transit. No definite dilatation/obstruction. Vascular/Lymphatic: Atherosclerotic calcifications aorta and iliac arteries without aneurysm. No adenopathy. Reproductive: Atrophic uterus and ovaries Other: No free air or free fluid.  No hernia. Musculoskeletal: Osseous demineralization. IMPRESSION: Focal inflammatory process of the small bowel loop in the upper RIGHT lateral pelvis with bowel wall  thickening and adjacent infiltrative changes compatible with enteritis. This could be due to infection, focal inflammatory bowel disease, foreign body perforation. Tiny foci of gas within or immediately adjacent to the small bowel wall at this site, cannot exclude micro perforation. No definite bowel dilatation to suggest obstruction, though small bowel stool sign is seen proximal to this indicating prolonged transit. No free air or abscess identified. Findings called to Dr. Tamera Punt on 04/07/2019 at 1722 hrs. Electronically Signed   By: Lavonia Dana M.D.   On: 04/07/2019 17:23   Ct Head Wo Contrast  Result Date: 04/07/2019 CLINICAL DATA:  Weakness, multiple falls EXAM: CT HEAD WITHOUT CONTRAST TECHNIQUE: Contiguous axial images were obtained from the base of the skull through the vertex without intravenous contrast. COMPARISON:  04/04/2019 FINDINGS: Brain: No evidence of acute infarction, hemorrhage, hydrocephalus, extra-axial collection or mass lesion/mass effect. Mild periventricular white matter hypodensity. Vascular: No hyperdense vessel or unexpected calcification. Skull: Normal. Negative for fracture or focal lesion. Sinuses/Orbits: No acute finding. Other: None. IMPRESSION: No acute intracranial pathology. Electronically Signed   By: Eddie Candle M.D.   On: 04/07/2019 17:07   Dg Chest Port 1 View  Result Date: 04/07/2019 CLINICAL DATA:  83 year old female with nausea, diarrhea, weakness and frequent falls EXAM: PORTABLE CHEST 1 VIEW COMPARISON:  Prior chest x-ray 03/19/2019 FINDINGS: Stable cardiomegaly. Surgical changes of prior left mastectomy and axillary nodal dissection. Subtle patchy right basilar airspace opacity favored to reflect atelectasis in the setting of low inspiratory volumes. No pulmonary edema. No large effusion or pneumothorax. No acute osseous abnormality. IMPRESSION: Low inspiratory volumes and stable cardiomegaly. No acute cardiopulmonary process. Electronically Signed  By: Jacqulynn Cadet M.D.   On: 04/07/2019 17:06    Procedures Procedures (including critical care time)  Medications Ordered in ED Medications  potassium chloride 10 mEq in 100 mL IVPB (10 mEq Intravenous New Bag/Given 04/07/19 1842)  allopurinol (ZYLOPRIM) tablet 100 mg (has no administration in time range)  aspirin tablet 81 mg (has no administration in time range)  amLODipine (NORVASC) tablet 10 mg (has no administration in time range)  atorvastatin (LIPITOR) tablet 20 mg (has no administration in time range)  furosemide (LASIX) tablet 40 mg (has no administration in time range)  metoprolol tartrate (LOPRESSOR) tablet 25 mg (has no administration in time range)  mirtazapine (REMERON) tablet 30 mg (has no administration in time range)  ondansetron (ZOFRAN-ODT) disintegrating tablet 4 mg (has no administration in time range)  ondansetron (ZOFRAN) tablet 4 mg (has no administration in time range)  pantoprazole (PROTONIX) EC tablet 40 mg (has no administration in time range)  vitamin B-12 (CYANOCOBALAMIN) tablet 1,000 mcg (has no administration in time range)  gabapentin (NEURONTIN) capsule 200 mg (has no administration in time range)  sodium chloride 0.9 % bolus 500 mL (500 mLs Intravenous New Bag/Given 04/07/19 1727)  piperacillin-tazobactam (ZOSYN) IVPB 3.375 g (0 g Intravenous Stopped 04/07/19 1801)     Initial Impression / Assessment and Plan / ED Course  I have reviewed the triage vital signs and the nursing notes.  Pertinent labs & imaging results that were available during my care of the patient were reviewed by me and considered in my medical decision making (see chart for details).        Patient is a 83 year old female who presents with generalized fatigue associated with abdominal pain.  She did have a recent CT scan however she was fairly tender on my exam so I repeated the CT scan.  This does show an evidence of inflammation in her small bowel consistent with possible enteritis.   There is also a tiny focus of air which could represent a microperforation although there was no perforation or abscess per the radiologist.  Her white count is normal.  There is no suggestions of sepsis.  She does have significant anemia with a drop from her baseline anemia.  Her rectal exam did show heme positive stool although there is no gross blood.  It does not appear that she is on anticoagulants.  She will likely need a blood transfusion but we are unable to start this at this facility.  She does have marked hypokalemia as well and she was started on potassium replacement.  She was given dose of IV antibiotics for her bowel inflammation.  I have updated her son and husband.  Her son request that she be admitted to [facility] as he works there. I spoke with Dr. Linda Hedges who will admit the pt. Final Clinical Impressions(s) / ED Diagnoses   Final diagnoses:  Enteritis  Hypokalemia  Iron deficiency anemia, unspecified iron deficiency anemia type    ED Discharge Orders    None       Malvin Johns, MD 04/07/19 1906

## 2019-04-07 NOTE — H&P (Addendum)
History and Physical    Samantha Mckenzie TZG:[id number] DOB: [birth year] DOA: 04/07/2019  Referring MD/NP/PA:   PCP: Colon Branch, MD   Patient coming from:  The patient is coming from home.  At baseline, pt is partially dependent for most of ADL.        Chief Complaint: Intermittent diarrhea abdominal pain, generalized weakness, fall  HPI: Samantha Mckenzie is a 83 y.o. female with medical history significant of hypertension, hyperlipidemia, diet-controlled diabetes, GERD, gout, depression, breast cancer (surgery radiation and chemotherapy), CKD stage IV, dCHF, who presents with intermittent diarrhea, abdominal pain, generalized weakness and fall.  Patient was recently hospitalized from 6/8-6/16 due to community-acquired pneumonia.  Patient had negative COVID-19 testing in that admission.  Patient states that she has been having intermittent diarrhea and abdominal pain for more than 2 weeks.  She states that she has been having 2-3 watery diarrhea each day, but no diarrhea today.  She also has intermittent abdominal pain, which is located in the right lower quadrant. She states that her AP could be very severe, sharp, nonradiating, but currently it has subsided.  Patient does not have vomiting currently.  No fever or chills.  Patient states that she has generalized weakness, but no unilateral numbness or tingling in extremities, No facial droop or slurred speech.  Patient states that she fell several times at home, at least one time she injured her head.  Patient states that she still has some cough with little brownish colored mucus production.  Denies shortness of breath, chest pain, runny nose or sore throat.  Patient states that she did not noticed dark stool or blood in stool, but his FOBT is positive in ED today.  Patient denies symptoms of UTI.  Endoscopy: EGD by Dr. Henrene Pastor on 01/13/15: normal Colonoscopy by Dr. Henrene Pastor on 09/13/2011: Moderate diverticulosis  ED Course: pt was found to have WBC  4.7, lipase 38, negative COVID-19 test ([facility]), positive FOBT, hemoglobin dropped from 10.0 on 03/24/2019 and 7.3, worsening renal function, potassium 2.3, magnesium 1.3, calcium 6.6, temperature 99.3, oxygen saturation at upper 80% on RA -->92-100% on 2L of nasal cannula oxygen, heart rate 93, blood pressure 152/56, CT head is negative for acute intracranial abnormalities.  Chest x-ray showed low volume without obvious infiltration.  CT abdomen/pelvis showed possible enteritis, but cannot rule out possibility of microperforation.  Patient is admitted to [place] bed as inpatient.  General surgeon, Dr. Kieth Brightly was consulted.  Review of Systems:   General: no fevers, chills, no body weight gain, has poor appetite, has fatigue HEENT: no blurry vision, hearing changes or sore throat Respiratory: has dyspnea, coughing, no wheezing CV: no chest pain, no palpitations GI: no nausea, vomiting, has abdominal pain, diarrhea, no constipation GU: no dysuria, burning on urination, increased urinary frequency, hematuria  Ext: no leg edema Neuro: no unilateral weakness, numbness, or tingling, no vision change or hearing loss Skin: no rash, no skin tear. MSK: No muscle spasm, no deformity, no limitation of range of movement in spin Heme: No easy bruising.  Travel history: No recent long distant travel.  Allergy:  Allergies  Allergen Reactions   Codeine Nausea And Vomiting   Morphine Nausea And Vomiting   Oxycodone-Acetaminophen Nausea And Vomiting   Sulfonamide Derivatives Nausea And Vomiting   Tramadol Hcl Nausea And Vomiting    Past Medical History:  Diagnosis Date   Allergic rhinitis    Anemia    Breast CA (HCC)    surgery, chemo, XRT; had peripheral  neuropathy (imbalance at times) after chemo   Cellulitis    Left arm, recurrent    Cellulitis LEFT arm recurrent 12/08/2011   Chronic renal disease, stage IV (HCC)    Dr. Moshe Cipro   Depression    Fatigue    GERD  (gastroesophageal reflux disease)    gastritis, EGD 02/2007   Gout    Hyperlipidemia    Hypertension    Macular degeneration 2011   Normal cardiac stress test 12/2014   Osteoarthritis    Renal insufficiency     Past Surgical History:  Procedure Laterality Date   cataracts bilaterally  1 -2012   LUNG BIOPSY  1999   neg   MASTECTOMY Left 1999   and lymphnodes     Social History:  reports that she has never smoked. She has never used smokeless tobacco. She reports current alcohol use. She reports that she does not use drugs.  Family History:  Family History  Problem Relation Age of Onset   Breast cancer Other        ? of    Heart attack Father 12   Diabetes Sister        2   Hypertension Sister    Colon cancer Neg Hx    Stomach cancer Neg Hx      Prior to Admission medications   Medication Sig Start Date End Date Taking? Authorizing Provider  allopurinol (ZYLOPRIM) 100 MG tablet Take 1 tablet (100 mg total) by mouth daily. 03/19/19  Yes Paz, Alda Berthold, MD  amLODipine (NORVASC) 10 MG tablet Take 1 tablet (10 mg total) by mouth daily. 03/06/19  Yes Colon Branch, MD  aspirin 81 MG tablet Take 81 mg by mouth daily.     Yes [provider]  atorvastatin (LIPITOR) 20 MG tablet Take 1 tablet (20 mg total) by mouth daily. 01/31/19  Yes Paz, Alda Berthold, MD  Cholecalciferol (VITAMIN D3) 1000 UNITS CAPS Take 1 capsule by mouth daily.    Yes [provider]  furosemide (LASIX) 40 MG tablet Take 40 mg by mouth 2 (two) times daily. 03/16/19  Yes [provider]  gabapentin (NEURONTIN) 100 MG capsule Take 2 capsules (200 mg total) by mouth at bedtime. 12/22/18  Yes Paz, Alda Berthold, MD  metoprolol tartrate (LOPRESSOR) 25 MG tablet Take 1 tablet (25 mg total) by mouth 2 (two) times daily. 07/11/18  Yes Paz, Alda Berthold, MD  mirtazapine (REMERON) 15 MG tablet Take 2 tablets (30 mg total) by mouth at bedtime. 04/03/19  Yes Paz, Alda Berthold, MD  Multiple Vitamins-Minerals (CENTRUM  SILVER PO) Take 1 tablet by mouth daily.    Yes [provider]  pantoprazole (PROTONIX) 40 MG tablet Take 1 tablet (40 mg total) by mouth daily. 03/27/19  Yes Mikhail, Teachey, DO  vitamin B-12 (CYANOCOBALAMIN) 1000 MCG tablet Take 1,000 mcg by mouth daily.     Yes [provider]  dicyclomine (BENTYL) 10 MG capsule Take 2 capsules (20 mg total) by mouth 4 (four) times daily -  before meals and at bedtime for 30 days. 03/27/19 04/26/19  Mikhail, Velta Addison, DO  ondansetron (ZOFRAN ODT) 4 MG disintegrating tablet Take 1 tablet (4 mg total) by mouth every 8 (eight) hours as needed for nausea or vomiting. 03/27/19   Mikhail, Velta Addison, DO  ondansetron (ZOFRAN) 4 MG tablet Take 1 tablet (4 mg total) by mouth 3 (three) times daily with meals. 03/27/19   Cristal Ford, DO    Physical Exam: Vitals:  04/07/19 1740 04/07/19 1816 04/07/19 1853 04/07/19 2021  BP: (!) 128/48  (!) 135/58 (!) 152/56  Pulse: 90 87 92 93  Resp: 15 (!) 23 (!) 32 16  Temp:    99.3 F (37.4 C)  TempSrc:    Oral  SpO2: 92% 98% 98% 100%  Weight:    59 kg  Height:    5' 3"  (1.6 m)   General: Not in acute distress HEENT:       Eyes: PERRL, EOMI, no scleral icterus.       ENT: No discharge from the ears and nose, no pharynx injection, no tonsillar enlargement.        Neck: No JVD, no bruit, no mass felt. Heme: No neck lymph node enlargement. Cardiac: S1/S2, RRR, No murmurs, No gallops or rubs. Respiratory: No rales, wheezing, rhonchi or rubs. GI: Soft, nondistended, has mild diffused tenderness, worse in RLQ, no rebound pain, no organomegaly, BS present. GU: No hematuria Ext: no leg edema bilaterally. 2+DP/PT pulse bilaterally. Musculoskeletal: No joint deformities, No joint redness or warmth, no limitation of ROM in spin. Skin: No rashes.  Neuro: Alert, oriented X3, cranial nerves II-XII grossly intact, moves all extremities normally.   Psych: Patient is not psychotic, no suicidal or hemocidal  ideation.  Labs on Admission: I have personally reviewed following labs and imaging studies  CBC: Recent Labs  Lab 04/07/19 1616  WBC 4.7  NEUTROABS 3.6  HGB 7.3*  HCT 22.0*  MCV 89.1  PLT 762   Basic Metabolic Panel: Recent Labs  Lab 04/07/19 1616 04/07/19 1721  NA 130*  --   K 2.3*  --   CL 86*  --   CO2 28  --   GLUCOSE 131*  --   BUN 55*  --   CREATININE 3.54*  --   CALCIUM 6.6*  --   MG  --  1.3*   GFR: Estimated Creatinine Clearance: 10.5 mL/min (A) (by C-G formula based on SCr of 3.54 mg/dL (H)). Liver Function Tests: Recent Labs  Lab 04/07/19 1616  AST 39  ALT 25  ALKPHOS 63  BILITOT 0.6  PROT 6.2*  ALBUMIN 2.1*   Recent Labs  Lab 04/07/19 1616  LIPASE 38   No results for input(s): AMMONIA in the last 168 hours. Coagulation Profile: Recent Labs  Lab 04/07/19 2157  INR 1.1   Cardiac Enzymes: No results for input(s): CKTOTAL, CKMB, CKMBINDEX, TROPONINI in the last 168 hours. BNP (last 3 results) No results for input(s): PROBNP in the last 8760 hours. HbA1C: No results for input(s): HGBA1C in the last 72 hours. CBG: No results for input(s): GLUCAP in the last 168 hours. Lipid Profile: No results for input(s): CHOL, HDL, LDLCALC, TRIG, CHOLHDL, LDLDIRECT in the last 72 hours. Thyroid Function Tests: No results for input(s): TSH, T4TOTAL, FREET4, T3FREE, THYROIDAB in the last 72 hours. Anemia Panel: No results for input(s): VITAMINB12, FOLATE, FERRITIN, TIBC, IRON, RETICCTPCT in the last 72 hours. Urine analysis:    Component Value Date/Time   COLORURINE STRAW (A) 04/07/2019 2239   APPEARANCEUR CLEAR 04/07/2019 2239   LABSPEC 1.004 (L) 04/07/2019 2239   PHURINE 6.0 04/07/2019 2239   GLUCOSEU NEGATIVE 04/07/2019 2239   HGBUR NEGATIVE 04/07/2019 2239   BILIRUBINUR NEGATIVE 04/07/2019 2239   KETONESUR NEGATIVE 04/07/2019 2239   PROTEINUR NEGATIVE 04/07/2019 2239   UROBILINOGEN 0.2 12/29/2014 1400   NITRITE NEGATIVE 04/07/2019 2239    LEUKOCYTESUR NEGATIVE 04/07/2019 2239   Sepsis Labs: @LABRCNTIP (procalcitonin:4,lacticidven:4) ) Recent Results (from  the past 240 hour(s))  SARS Coronavirus 2 (Hosp order,Performed in [facility] lab via [facility] ID)     Status: None   Collection Time: 04/07/19  4:18 PM   Specimen: Dry Nasal Swab ([facility] ID Now)  Result Value Ref Range Status   SARS Coronavirus 2 ([facility] ID Now) NEGATIVE NEGATIVE Final    Comment: (NOTE) Interpretive Result Comment(s): COVID 19 Positive SARS CoV 2 target nucleic acids are DETECTED. The SARS CoV 2 RNA is generally detectable in upper and lower respiratory specimens during the acute phase of infection.  Positive results are indicative of active infection with SARS CoV 2.  Clinical correlation with patient history and other diagnostic information is necessary to determine patient infection status.  Positive results do not rule out bacterial infection or coinfection with other viruses. The expected result is Negative. COVID 19 Negative SARS CoV 2 target nucleic acids are NOT DETECTED. The SARS CoV 2 RNA is generally detectable in upper and lower respiratory specimens during the acute phase of infection.  Negative results do not preclude SARS CoV 2 infection, do not rule out coinfections with other pathogens, and should not be used as the sole basis for treatment or other patient management decisions.  Negative results must be combined with clinical  observations, patient history, and epidemiological information. The expected result is Negative. Invalid Presence or absence of SARS CoV 2 nucleic acids cannot be determined. Repeat testing was performed on the submitted specimen and repeated Invalid results were obtained.  If clinically indicated, additional testing on a new specimen with an alternate test methodology [phone]) is advised.  The SARS CoV 2 RNA is generally detectable in upper and lower respiratory specimens during the acute phase  of infection. The expected result is Negative. Fact Sheet for Patients:  GolfingFamily.no Fact Sheet for Healthcare Providers: https://www.hernandez-brewer.com/ This test is not yet approved or cleared by the [place] FDA and has been authorized for detection and/or diagnosis of SARS CoV 2 by FDA under an Emergency Use Authorization (EUA).  This EUA will remain in effect (meaning this test can be used) for the duration of the COVID19 d eclaration under Section 564(b)(1) of the Act, 21 U.S.C. section [phone] 3(b)(1), unless the authorization is terminated or revoked sooner. Performed at [facility], [address] [place], [place] [address]   Culture, blood (Routine X 2) w Reflex to ID Panel     Status: None (Preliminary result)   Collection Time: 04/07/19  9:57 PM   Specimen: BLOOD RIGHT FOREARM  Result Value Ref Range Status   Specimen Description BLOOD RIGHT FOREARM  Final   Special Requests   Final    BOTTLES DRAWN AEROBIC ONLY Blood Culture adequate volume Performed at [facility], [place] [address] [place], [place] [address]    Culture PENDING  Incomplete   Report Status PENDING  Incomplete     Radiological Exams on Admission: Ct Abdomen Pelvis Wo Contrast  Result Date: 04/07/2019 CLINICAL DATA:  Weakness, intermittent nausea and diarrhea since 03/25/2019, 4 falls, unspecified abdominal pain, history stage IV chronic renal disease, GERD, hypertension, hyperlipidemia, breast cancer EXAM: CT ABDOMEN AND PELVIS WITHOUT CONTRAST TECHNIQUE: Multidetector CT imaging of the abdomen and pelvis was performed following the standard protocol without IV contrast. Sagittal and coronal MPR images reconstructed from axial data set. No oral contrast was administered COMPARISON:  03/19/2019 FINDINGS: Lower chest: Bibasilar atelectasis with questionable coexistent consolidation RIGHT lower lobe. Hepatobiliary: Gallbladder and liver normal  appearance  Pancreas: Atrophic pancreas without mass Spleen: Normal size with a few scattered calcified granulomata. Adrenals/Urinary Tract: Adrenal glands normal appearance. Minimally dilated renal pelves bilaterally without hydroureter or ureteral calcification. 2.2 x 2.3 cm exophytic RIGHT renal cyst. Intermediate attenuation 11 mm nodule posterior LEFT kidney image 33 versus lobulated cortex unchanged. Ureters and bladder otherwise unremarkable. Stomach/Bowel: Sigmoid diverticulosis without evidence of diverticulitis. Appendix not definitely visualized. Loop of small bowel in the RIGHT upper lateral pelvis demonstrates mild wall thickening and surrounding inflammatory changes compatible with enteritis. Several tiny foci of gas are seen either within or immediately adjacent to the small bowel wall, cannot exclude microperforation. Small bowel proximal to this is upper normal in caliber and demonstrates a small bowel stool sign suggesting prolonged transit. No definite dilatation/obstruction. Vascular/Lymphatic: Atherosclerotic calcifications aorta and iliac arteries without aneurysm. No adenopathy. Reproductive: Atrophic uterus and ovaries Other: No free air or free fluid.  No hernia. Musculoskeletal: Osseous demineralization. IMPRESSION: Focal inflammatory process of the small bowel loop in the upper RIGHT lateral pelvis with bowel wall thickening and adjacent infiltrative changes compatible with enteritis. This could be due to infection, focal inflammatory bowel disease, foreign body perforation. Tiny foci of gas within or immediately adjacent to the small bowel wall at this site, cannot exclude micro perforation. No definite bowel dilatation to suggest obstruction, though small bowel stool sign is seen proximal to this indicating prolonged transit. No free air or abscess identified. Findings called to Dr. Tamera Punt on 04/07/2019 at 1722 hrs. Electronically Signed   By: Lavonia Dana M.D.   On: 04/07/2019 17:23   Ct  Head Wo Contrast  Result Date: 04/07/2019 CLINICAL DATA:  Weakness, multiple falls EXAM: CT HEAD WITHOUT CONTRAST TECHNIQUE: Contiguous axial images were obtained from the base of the skull through the vertex without intravenous contrast. COMPARISON:  04/04/2019 FINDINGS: Brain: No evidence of acute infarction, hemorrhage, hydrocephalus, extra-axial collection or mass lesion/mass effect. Mild periventricular white matter hypodensity. Vascular: No hyperdense vessel or unexpected calcification. Skull: Normal. Negative for fracture or focal lesion. Sinuses/Orbits: No acute finding. Other: None. IMPRESSION: No acute intracranial pathology. Electronically Signed   By: Eddie Candle M.D.   On: 04/07/2019 17:07   Dg Chest Port 1 View  Result Date: 04/07/2019 CLINICAL DATA:  83 year old female with nausea, diarrhea, weakness and frequent falls EXAM: PORTABLE CHEST 1 VIEW COMPARISON:  Prior chest x-ray 03/19/2019 FINDINGS: Stable cardiomegaly. Surgical changes of prior left mastectomy and axillary nodal dissection. Subtle patchy right basilar airspace opacity favored to reflect atelectasis in the setting of low inspiratory volumes. No pulmonary edema. No large effusion or pneumothorax. No acute osseous abnormality. IMPRESSION: Low inspiratory volumes and stable cardiomegaly. No acute cardiopulmonary process. Electronically Signed   By: Jacqulynn Cadet M.D.   On: 04/07/2019 17:06     EKG: Independently reviewed.  Sinus rhythm, QTC 500, LAE, Q waves in lead III/aVF, nonspecific T wave change.  Assessment/Plan Principal Problem:   Enteritis Active Problems:   Diabetes mellitus type 2 with complications (HCC)   Hyperlipidemia   GOUT   Normocytic anemia   Essential hypertension   GERD   Depression   Generalized weakness   Hypomagnesemia   Acute respiratory failure with hypoxia (HCC)   Acute renal failure superimposed on stage 4 chronic kidney disease (HCC)   Chronic diastolic CHF (congestive heart  failure) (HCC)   Hypokalemia   Hypocalcemia   Fall   Occult blood positive stool   Enteritis: Patient has been having intermittent diarrhea and abdominal pain.  CT scan showed possible enteritis, cannot rule out small bowel microperforation.  Patient does not have fever or leukocytosis.  Clinically does not meet criteria for sepsis.  Currently hemodynamically stable.  General surgeon, Dr. Kieth Brightly was consulted.  -will admitted to [place] bed as inpatient - IV Zosyn - Blood culture - PRN hydroxyzine for nausea or vomiting (cannot use Zofran due to QTC prolongation) - Follow-up surgeon's recommendation, which will be be highly appreciated. - f/u GI path panel  Diet controled  Diabetes mellitus type 2 with complications ([place]): Last A1c 6.3 on 06/17/18, well controled.  Blood sugar 131 -check CBG qAM  Hyperlipidemia: -lipitor  GOUT: -Allopurinol  Normocytic anemia and positive FOBT; hemoglobin dropped from 10-7.3.  No gross rectal bleeding. - IVF: 500 of NS - change pantoprazole from 40 daily to bid orally - Avoid NSAIDs and SQ heparin - Maintain IV access (2 large bore IVs if possible). - Monitor closely and follow q6h cbc, transfuse as necessary, if Hgb<7.0 - INR/PTT/type & screen -may need to call GI in AM  HTN:  -Continue home medications: Amlodipine, metoprolol -IV hydralazine prn  GERD: -Protonix  Depression: -Hold Remeron due to QTC prolongation  Acute respiratory failure with hypoxia ([place]): Most likely due to incomplete recovery from recently treated community-acquired pneumonia.  Patient had negative COVID-19 test in that admission.  Still has some cough.  Chest x-ray did not show obvious infiltration. -Symptomatic treatment -PRN albuterol inhaler - Mucinex for cough - Nasal cannula oxygen to maintain oxygen saturation above 93% - Repeat COVID-19 test  Acute renal failure superimposed on stage 4 chronic kidney disease: Baseline Cre is 1.7-2.4 , pt's Cre is 3.54  an BUN 55 on admission. Likely due to prerenal secondary to dehydration and continuation of diuretics - IVF: NS 500 - Follow up renal function by BMP - Hold lasix  Chronic diastolic CHF (congestive heart failure) ([place]): 2D echo on 06/02/2017 showed EF of 55% with grade 1 diastolic dysfunction.  Patient does not have shortness of breath, no DVT.  No pulmonary edema on chest x-ray.  CHF seem to be compensated. -Hold Lasix due to worsening renal function - Continue metoprolol -Hold aspirin due to GI bleeding -Check BNP  Hypokalemia, Hypocalcemia and hypomagnesemia -Repleted all  Fall and generalized weakness: Patient fell 4 times, most likely due to generalized weakness.  Her generalized weakness is likely multifactorial etiology given multiple comorbidities and acute issues as listed above.  CT head is negative for acute intracranial abnormalities.  No focal neurologic findings on physical examination.  Low suspicions for stroke. - PT/OT -consult CM and SW for possible SNF    Inpatient status:  # Patient requires inpatient status due to high intensity of service, high risk for further deterioration and high frequency of surveillance required.  I certify that at the point of admission it is my clinical judgment that the patient will require inpatient hospital care spanning beyond 2 midnights from the point of admission.   This patient has multiple chronic comorbidities including hypertension, hyperlipidemia, diet-controlled diabetes, GERD, gout, depression, breast cancer (surgery radiation and chemotherapy), CKD stage IV, dCHF  Now patient has presenting with  intermittent diarrhea, abdominal pain, enteritis, electrolytes disturbance, worsening renal function, oxygen desaturation, generalized weakness and fall.  The worrisome physical exam findings include abdominal tenderness  The initial radiographic and laboratory data are worrisome because of electrolytes disturbance, worsening renal  function, worsening anemia, positive FOBT.  CT scan showed enteritis and a possible microperforation of the small bowel.  Current medical needs: please see my assessment and plan  Predictability of an adverse outcome (risk): Patient has multiple comorbidities, now presents with multiple acute issues as listed above.  Her presentation is highly complicated.  Given her old age, patient is at high risk of deteriorating.  Patient will need to be treated in hospital for at least 2 days.      DVT ppx: SCD Code Status: DNR ( DNR (I discussed with patient about code status, and explained the meaning of CODE STATUS. Patient is very sure that she wants to be DNR. This may need to be confirmed with her power of attorney in the morning) Family Communication: None at bed side.  Disposition Plan:  Possible SNF Consults called:  Dr. Kieth Brightly of general surgeon Admission status: medical floor/inpt   Date of Service 04/07/2019    [facility]   If 7PM-7AM, please contact night-coverage [email or web address] Password [facility] 04/07/2019, 10:52 PM

## 2019-04-07 NOTE — ED Notes (Signed)
Report given to Nellie from [facility].  Aware that patient is on the way.

## 2019-04-07 NOTE — ED Triage Notes (Addendum)
Pt d/c from [facility] hospital on June 14. Her son reports she has been weak, with intermittent nausea and diarrhea since then. She had a neg covid test while in [facility]. Since she has been home she has felt weak and had 4 falls. Her son is at bedside and states pt was supposed to be set up for home rehab but she has been too weak and her husband is having a hard time caring for her

## 2019-04-07 NOTE — ED Notes (Signed)
Called [facility] and spoke to [facility] for consult to hospitalist for admission

## 2019-04-07 NOTE — ED Notes (Signed)
[facility] notified Samantha Mckenzie) - patient ready for transport

## 2019-04-07 NOTE — ED Notes (Signed)
Attempted report.  Nurse to call back when available. 

## 2019-04-07 NOTE — ED Notes (Signed)
O2 sat noted to be dropping to mid to upper 80's on room air.  Patient asymptomatic.  Placed on 2 L via [place] at this time.  Dr. Tamera Punt made aware.

## 2019-04-07 NOTE — Consult Note (Signed)
Reason for Consult: abdominal pain Referring Physician: [name] is an 83 y.o. female.  HPI: 83 yo female with 2 weeks of malaise and abdominal pain. Pain was very bad last week. This week she feels very week, she has fallen twice. She has no abdominal pain. She is having multiple stools a day and some nausea.  Past Medical History:  Diagnosis Date   Allergic rhinitis    Anemia    Breast CA (HCC)    surgery, chemo, XRT; had peripheral neuropathy (imbalance at times) after chemo   Cellulitis    Left arm, recurrent    Cellulitis LEFT arm recurrent 12/08/2011   Chronic renal disease, stage IV ([place])    Samantha Mckenzie   Depression    Fatigue    GERD (gastroesophageal reflux disease)    gastritis, EGD 02/2007   Gout    Hyperlipidemia    Hypertension    Macular degeneration 2011   Normal cardiac stress test 12/2014   Osteoarthritis    Renal insufficiency     Past Surgical History:  Procedure Laterality Date   cataracts bilaterally  1 -2012   LUNG BIOPSY  1999   neg   MASTECTOMY Left 1999   and lymphnodes     Family History  Problem Relation Age of Onset   Breast cancer Other        ? of    Heart attack Father 22   Diabetes Sister        2   Hypertension Sister    Colon cancer Neg Hx    Stomach cancer Neg Hx     Social History:  reports that she has never smoked. She has never used smokeless tobacco. She reports current alcohol use. She reports that she does not use drugs.  Allergies:  Allergies  Allergen Reactions   Codeine Nausea And Vomiting   Morphine Nausea And Vomiting   Oxycodone-Acetaminophen Nausea And Vomiting   Sulfonamide Derivatives Nausea And Vomiting   Tramadol Hcl Nausea And Vomiting    Medications: I have reviewed the patient's current medications.  Results for orders placed or performed during the hospital encounter of 04/07/19 (from the past 48 hour(s))  Comprehensive metabolic panel      Status: Abnormal   Collection Time: 04/07/19  4:16 PM  Result Value Ref Range   Sodium 130 (L) 135 - 145 mmol/L   Potassium 2.3 (LL) 3.5 - 5.1 mmol/L    Comment: CRITICAL RESULT CALLED TO, READ BACK BY AND VERIFIED WITH: GOUGE S RN ON 04/07/19 AT 1509 BY [email or web address]    Chloride 86 (L) 98 - 111 mmol/L   CO2 28 22 - 32 mmol/L   Glucose, Bld 131 (H) 70 - 99 mg/dL   BUN 55 (H) 8 - 23 mg/dL   Creatinine, Ser 3.54 (H) 0.44 - 1.00 mg/dL   Calcium 6.6 (L) 8.9 - 10.3 mg/dL   Total Protein 6.2 (L) 6.5 - 8.1 g/dL   Albumin 2.1 (L) 3.5 - 5.0 g/dL   AST 39 15 - 41 U/L   ALT 25 0 - 44 U/L   Alkaline Phosphatase 63 38 - 126 U/L   Total Bilirubin 0.6 0.3 - 1.2 mg/dL   GFR calc non Af Amer 12 (L) >60 mL/min   GFR calc Af Amer 13 (L) >60 mL/min   Anion gap 16 (H) 5 - 15    Comment: Performed at [facility], [address] [place], [place]  [address]  Lipase, blood     Status: None   Collection Time: 04/07/19  4:16 PM  Result Value Ref Range   Lipase 38 11 - 51 U/L    Comment: Performed at [facility] [address] [place], [place] [address]  CBC with Differential     Status: Abnormal   Collection Time: 04/07/19  4:16 PM  Result Value Ref Range   WBC 4.7 4.0 - 10.5 K/uL   RBC 2.47 (L) 3.87 - 5.11 MIL/uL   Hemoglobin 7.3 (L) 12.0 - 15.0 g/dL   HCT 22.0 (L) 36.0 - 46.0 %   MCV 89.1 80.0 - 100.0 fL   MCH 29.6 26.0 - 34.0 pg   MCHC 33.2 30.0 - 36.0 g/dL   RDW 14.0 11.5 - 15.5 %   Platelets 377 150 - 400 K/uL   nRBC 0.0 0.0 - 0.2 %   Neutrophils Relative % 75 %   Neutro Abs 3.6 1.7 - 7.7 K/uL   Lymphocytes Relative 14 %   Lymphs Abs 0.7 0.7 - 4.0 K/uL   Monocytes Relative 8 %   Monocytes Absolute 0.4 0.1 - 1.0 K/uL   Eosinophils Relative 2 %   Eosinophils Absolute 0.1 0.0 - 0.5 K/uL   Basophils Relative 0 %   Basophils Absolute 0.0 0.0 - 0.1 K/uL   WBC Morphology DOHLE BODIES     Comment: TOXIC GRANULATION   RBC Morphology MORPHOLOGY UNREMARKABLE    Smear Review  MORPHOLOGY UNREMARKABLE    Immature Granulocytes 1 %   Abs Immature Granulocytes 0.05 0.00 - 0.07 K/uL    Comment: Performed at [facility], [address] [place], [place] [address]  SARS Coronavirus 2 (Hosp order,Performed in [place] lab via [employer] [facility])     Status: None   Collection Time: 04/07/19  4:18 PM   Specimen: Dry Nasal Swab ([facility] Now)  Result Value Ref Range   SARS Coronavirus 2 ([facility] Now) NEGATIVE NEGATIVE    Comment: (NOTE) Interpretive Result Comment(s): COVID 19 Positive SARS CoV 2 target nucleic acids are DETECTED. The SARS CoV 2 RNA is generally detectable in upper and lower respiratory specimens during the acute phase of infection.  Positive results are indicative of active infection with SARS CoV 2.  Clinical correlation with patient history and other diagnostic information is necessary to determine patient infection status.  Positive results do not rule out bacterial infection or coinfection with other viruses. The expected result is Negative. COVID 19 Negative SARS CoV 2 target nucleic acids are NOT DETECTED. The SARS CoV 2 RNA is generally detectable in upper and lower respiratory specimens during the acute phase of infection.  Negative results do not preclude SARS CoV 2 infection, do not rule out coinfections with other pathogens, and should not be used as the sole basis for treatment or other patient management decisions.  Negative results must be combined with clinical  observations, patient history, and epidemiological information. The expected result is Negative. Invalid Presence or absence of SARS CoV 2 nucleic acids cannot be determined. Repeat testing was performed on the submitted specimen and repeated Invalid results were obtained.  If clinically indicated, additional testing on a new specimen with an alternate test methodology [phone]) is advised.  The SARS CoV 2 RNA is generally detectable in upper and lower  respiratory specimens during the acute phase of infection. The expected result is Negative. Fact Sheet for Patients:  GolfingFamily.no Fact Sheet for Healthcare Providers:  https://www.hernandez-brewer.com/ This test is not yet approved or cleared by the [place] and has been authorized for detection and/or diagnosis of SARS CoV 2 by FDA under an Emergency Use Authorization (EUA).  This EUA will remain in effect (meaning this test can be used) for the duration of the COVID19 d eclaration under Section 564(b)(1) of the Act, 21 U.S.C. section [phone] 3(b)(1), unless the authorization is terminated or revoked sooner. Performed at [facility], [address], [place], [place] [address]   Magnesium     Status: Abnormal   Collection Time: 04/07/19  5:21 PM  Result Value Ref Range   Magnesium 1.3 (L) 1.7 - 2.4 mg/dL    Comment: Performed at [facility], [address] [place], [place] [address]  Occult blood card to lab, stool     Status: Abnormal   Collection Time: 04/07/19  5:36 PM  Result Value Ref Range   Fecal Occult Bld POSITIVE (A) NEGATIVE    Comment: Performed at [facility], [address] [place], [place] [address]    Ct Abdomen Pelvis Wo Contrast  Result Date: 04/07/2019 CLINICAL DATA:  Weakness, intermittent nausea and diarrhea since 03/25/2019, 4 falls, unspecified abdominal pain, history stage IV chronic renal disease, GERD, hypertension, hyperlipidemia, breast cancer EXAM: CT ABDOMEN AND PELVIS WITHOUT CONTRAST TECHNIQUE: Multidetector CT imaging of the abdomen and pelvis was performed following the standard protocol without IV contrast. Sagittal and coronal MPR images reconstructed from axial data set. No oral contrast was administered COMPARISON:  03/19/2019 FINDINGS: Lower chest: Bibasilar atelectasis with questionable coexistent consolidation RIGHT lower lobe. Hepatobiliary: Gallbladder and liver  normal appearance Pancreas: Atrophic pancreas without mass Spleen: Normal size with a few scattered calcified granulomata. Adrenals/Urinary Tract: Adrenal glands normal appearance. Minimally dilated renal pelves bilaterally without hydroureter or ureteral calcification. 2.2 x 2.3 cm exophytic RIGHT renal cyst. Intermediate attenuation 11 mm nodule posterior LEFT kidney image 33 versus lobulated cortex unchanged. Ureters and bladder otherwise unremarkable. Stomach/Bowel: Sigmoid diverticulosis without evidence of diverticulitis. Appendix not definitely visualized. Loop of small bowel in the RIGHT upper lateral pelvis demonstrates mild wall thickening and surrounding inflammatory changes compatible with enteritis. Several tiny foci of gas are seen either within or immediately adjacent to the small bowel wall, cannot exclude microperforation. Small bowel proximal to this is upper normal in caliber and demonstrates a small bowel stool sign suggesting prolonged transit. No definite dilatation/obstruction. Vascular/Lymphatic: Atherosclerotic calcifications aorta and iliac arteries without aneurysm. No adenopathy. Reproductive: Atrophic uterus and ovaries Other: No free air or free fluid.  No hernia. Musculoskeletal: Osseous demineralization. IMPRESSION: Focal inflammatory process of the small bowel loop in the upper RIGHT lateral pelvis with bowel wall thickening and adjacent infiltrative changes compatible with enteritis. This could be due to infection, focal inflammatory bowel disease, foreign body perforation. Tiny foci of gas within or immediately adjacent to the small bowel wall at this site, cannot exclude micro perforation. No definite bowel dilatation to suggest obstruction, though small bowel stool sign is seen proximal to this indicating prolonged transit. No free air or abscess identified. Findings called to Dr. Tamera Punt on 04/07/2019 at 1722 hrs. Electronically Signed   By: Lavonia Dana M.D.   On: 04/07/2019 17:23    Ct Head Wo Contrast  Result Date: 04/07/2019 CLINICAL DATA:  Weakness, multiple falls EXAM: CT HEAD WITHOUT CONTRAST TECHNIQUE: Contiguous axial images were obtained from the base of the skull through the vertex without intravenous contrast. COMPARISON:  04/04/2019 FINDINGS: Brain: No evidence of acute infarction, hemorrhage, hydrocephalus, extra-axial collection or mass lesion/mass effect. Mild periventricular white matter hypodensity. Vascular: No hyperdense vessel or unexpected calcification. Skull: Normal. Negative for fracture or focal lesion. Sinuses/Orbits: No acute finding. Other: None. IMPRESSION: No acute intracranial pathology. Electronically Signed   By: Eddie Candle M.D.   On: 04/07/2019 17:07   Dg Chest Port 1 View  Result Date: 04/07/2019 CLINICAL DATA:  83 year old female with nausea, diarrhea, weakness and frequent falls EXAM: PORTABLE CHEST 1 VIEW COMPARISON:  Prior chest x-ray 03/19/2019 FINDINGS: Stable cardiomegaly. Surgical changes of prior left mastectomy and axillary nodal dissection. Subtle patchy right basilar airspace opacity favored to reflect atelectasis in the setting of low inspiratory volumes. No pulmonary edema. No large effusion or pneumothorax. No acute osseous abnormality. IMPRESSION: Low inspiratory volumes and stable cardiomegaly. No acute cardiopulmonary process. Electronically Signed   By: Jacqulynn Cadet M.D.   On: 04/07/2019 17:06    Review of Systems  Constitutional: Positive for malaise/fatigue and weight loss. Negative for chills and fever.  HENT: Negative for hearing loss.   Eyes: Negative for blurred vision and double vision.  Respiratory: Negative for cough and hemoptysis.   Cardiovascular: Negative for chest pain and palpitations.  Gastrointestinal: Positive for abdominal pain, diarrhea and nausea. Negative for vomiting.  Genitourinary: Negative for dysuria and urgency.  Musculoskeletal: Negative for myalgias and neck pain.  Skin: Negative for  itching and rash.  Neurological: Positive for weakness. Negative for dizziness, tingling and headaches.  Endo/Heme/Allergies: Does not bruise/bleed easily.  Psychiatric/Behavioral: Negative for depression and suicidal ideas.   Blood pressure (!) 152/56, pulse 93, temperature 99.3 F (37.4 C), temperature source Oral, resp. rate 16, height 5' 3"  (1.6 m), weight 59 kg, SpO2 100 %. Physical Exam  Vitals reviewed. Constitutional: She is oriented to person, place, and time. She appears well-developed and well-nourished.  HENT:  Head: Normocephalic and atraumatic.  Eyes: Pupils are equal, round, and reactive to light. Conjunctivae and EOM are normal.  Neck: Normal range of motion. Neck supple.  Cardiovascular: Normal rate and regular rhythm.  Respiratory: Effort normal and breath sounds normal.  GI: Soft. Bowel sounds are normal. She exhibits no distension. There is abdominal tenderness.  Diffuse tenderness slightly worse in right lower area  Musculoskeletal: Normal range of motion.  Neurological: She is alert and oriented to person, place, and time.  Skin: Skin is warm and dry.  Psychiatric: She has a normal mood and affect. Her behavior is normal.      Assessment/Plan: 83 yo female with abdominal pain and findings of enteritis with small amount of air around the area. At this time I recommend broad spectrum antibiotics. We will follow along and watch for clinical changes. -bowel rest -IV abx  Arta Bruce Braydyn Schultes 04/07/2019, 10:11 PM

## 2019-04-07 NOTE — ED Notes (Signed)
Leaving with [facility] at this time.  Attempted report again.  Nurse to call back when available.

## 2019-04-07 NOTE — Progress Notes (Signed)
Pharmacy Antibiotic Note  Samantha Mckenzie is a 83 y.o. female admitted on 04/07/2019 with intra abdominal infection.  Pharmacy has been consulted for zosyn dosing. Zosyn 3.375 gm given earlier tonight  Plan: Continue zosyn 2.25 gm IV q8 F/u renal function, cultures and clinical course  Height: 5' 3"  (160 cm) Weight: 130 lb (59 kg) IBW/kg (Calculated) : 52.4  Temp (24hrs), Avg:99 F (37.2 C), Min:98.7 F (37.1 C), Max:99.3 F (37.4 C)  Recent Labs  Lab 04/07/19 1616  WBC 4.7  CREATININE 3.54*    Estimated Creatinine Clearance: 10.5 mL/min (A) (by C-G formula based on SCr of 3.54 mg/dL (H)).    Allergies  Allergen Reactions  . Codeine Nausea And Vomiting  . Morphine Nausea And Vomiting  . Oxycodone-Acetaminophen Nausea And Vomiting  . Sulfonamide Derivatives Nausea And Vomiting  . Tramadol Hcl Nausea And Vomiting    Thank you for allowing pharmacy to be a part of this patient's care.  Excell Seltzer Poteet 04/07/2019 10:05 PM

## 2019-04-07 NOTE — ED Notes (Signed)
ED TO INPATIENT HANDOFF REPORT  ED Nurse Name and Phone #: Samantha Mckenzie Name/Age/Gender Samantha Mckenzie 83 y.o. female Room/Bed: MH11/MH11  Code Status   Code Status: Prior  Home/SNF/Other Home Patient oriented to: self, place, time and situation Is this baseline? Yes   Triage Complete: Triage complete  Chief Complaint Weakness; Difficulty Walking   Triage Note Pt d/c from [facility] hospital on June 14. Her son reports she has been weak, with intermittent nausea and diarrhea since then. She had a neg covid test while in [facility]. Since she has been home she has felt weak and had 4 falls. Her son is at bedside and states pt was supposed to be set up for home rehab but she has been too weak and her husband is having a hard time caring for her   Allergies Allergies  Allergen Reactions  . Codeine Nausea And Vomiting  . Morphine Nausea And Vomiting  . Oxycodone-Acetaminophen Nausea And Vomiting  . Sulfonamide Derivatives Nausea And Vomiting  . Tramadol Hcl Nausea And Vomiting    Level of Care/Admitting Diagnosis ED Disposition    ED Disposition Condition Comment   Admit  The patient appears reasonably stabilized for admission considering the current resources, flow, and capabilities available in the ED at this time, and I doubt any other [facility] requiring further screening and/or treatment in the ED prior to admission is  present.       B Medical/Surgery History Past Medical History:  Diagnosis Date  . Allergic rhinitis   . Anemia   . Breast CA ([place])    surgery, chemo, XRT; had peripheral neuropathy (imbalance at times) after chemo  . Cellulitis    Left arm, recurrent   . Cellulitis LEFT arm recurrent 12/08/2011  . Chronic renal disease, stage IV (HCC)    Dr. Moshe Cipro  . Depression   . Fatigue   . GERD (gastroesophageal reflux disease)    gastritis, EGD 02/2007  . Gout   . Hyperlipidemia   . Hypertension   . Macular degeneration 2011  . Normal cardiac stress test 12/2014   . Osteoarthritis   . Renal insufficiency    Past Surgical History:  Procedure Laterality Date  . cataracts bilaterally  1 -2012  . LUNG BIOPSY  1999   neg  . MASTECTOMY Left 1999   and lymphnodes      A IV Location/Drains/Wounds Patient Lines/Drains/Airways Status   Active Line/Drains/Airways    Name:   Placement date:   Placement time:   Site:   Days:   Peripheral IV 04/07/19 Right Antecubital   04/07/19    1628    Antecubital   less than 1          Intake/Output Last 24 hours No intake or output data in the 24 hours ending 04/07/19 1802  Labs/Imaging Results for orders placed or performed during the hospital encounter of 04/07/19 (from the past 48 hour(s))  Comprehensive metabolic panel     Status: Abnormal   Collection Time: 04/07/19  4:16 PM  Result Value Ref Range   Sodium 130 (L) 135 - 145 mmol/L   Potassium 2.3 (LL) 3.5 - 5.1 mmol/L    Comment: CRITICAL RESULT CALLED TO, READ BACK BY AND VERIFIED WITH: GOUGE S RN ON 04/07/19 AT 1509 BY [email or web address]    Chloride 86 (L) 98 - 111 mmol/L   CO2 28 22 - 32 mmol/L   Glucose, Bld 131 (H) 70 - 99 mg/dL   BUN 55 (H) 8 -  23 mg/dL   Creatinine, Ser 3.54 (H) 0.44 - 1.00 mg/dL   Calcium 6.6 (L) 8.9 - 10.3 mg/dL   Total Protein 6.2 (L) 6.5 - 8.1 g/dL   Albumin 2.1 (L) 3.5 - 5.0 g/dL   AST 39 15 - 41 U/L   ALT 25 0 - 44 U/L   Alkaline Phosphatase 63 38 - 126 U/L   Total Bilirubin 0.6 0.3 - 1.2 mg/dL   GFR calc non Af Amer 12 (L) >60 mL/min   GFR calc Af Amer 13 (L) >60 mL/min   Anion gap 16 (H) 5 - 15    Comment: Performed at [facility], [address] [place], [place] [address]  Lipase, blood     Status: None   Collection Time: 04/07/19  4:16 PM  Result Value Ref Range   Lipase 38 11 - 51 U/L    Comment: Performed at [facility], [address] [place], [place] [address]  CBC with Differential     Status: Abnormal   Collection Time: 04/07/19  4:16 PM  Result Value Ref Range   WBC 4.7 4.0 -  10.5 K/uL   RBC 2.47 (L) 3.87 - 5.11 MIL/uL   Hemoglobin 7.3 (L) 12.0 - 15.0 g/dL   HCT 22.0 (L) 36.0 - 46.0 %   MCV 89.1 80.0 - 100.0 fL   MCH 29.6 26.0 - 34.0 pg   MCHC 33.2 30.0 - 36.0 g/dL   RDW 14.0 11.5 - 15.5 %   Platelets 377 150 - 400 K/uL   nRBC 0.0 0.0 - 0.2 %   Neutrophils Relative % 75 %   Neutro Abs 3.6 1.7 - 7.7 K/uL   Lymphocytes Relative 14 %   Lymphs Abs 0.7 0.7 - 4.0 K/uL   Monocytes Relative 8 %   Monocytes Absolute 0.4 0.1 - 1.0 K/uL   Eosinophils Relative 2 %   Eosinophils Absolute 0.1 0.0 - 0.5 K/uL   Basophils Relative 0 %   Basophils Absolute 0.0 0.0 - 0.1 K/uL   WBC Morphology DOHLE BODIES     Comment: TOXIC GRANULATION   RBC Morphology MORPHOLOGY UNREMARKABLE    Smear Review MORPHOLOGY UNREMARKABLE    Immature Granulocytes 1 %   Abs Immature Granulocytes 0.05 0.00 - 0.07 K/uL    Comment: Performed at [facility], [address] [place], [place] [address]  SARS Coronavirus 2 (Hosp order,Performed in [employer] lab via [employer] ID)     Status: None   Collection Time: 04/07/19  4:18 PM   Specimen: Dry Nasal Swab ([employer] Now)  Result Value Ref Range   SARS Coronavirus 2 ([employer] Now) NEGATIVE NEGATIVE    Comment: (NOTE) Interpretive Result Comment(s): COVID 19 Positive SARS CoV 2 target nucleic acids are DETECTED. The SARS CoV 2 RNA is generally detectable in upper and lower respiratory specimens during the acute phase of infection.  Positive results are indicative of active infection with SARS CoV 2.  Clinical correlation with patient history and other diagnostic information is necessary to determine patient infection status.  Positive results do not rule out bacterial infection or coinfection with other viruses. The expected result is Negative. COVID 19 Negative SARS CoV 2 target nucleic acids are NOT DETECTED. The SARS CoV 2 RNA is generally detectable in upper and lower respiratory specimens during the acute phase of infection.   Negative results do not preclude SARS CoV 2 infection, do not rule out coinfections  with other pathogens, and should not be used as the sole basis for treatment or other patient management decisions.  Negative results must be combined with clinical  observations, patient history, and epidemiological information. The expected result is Negative. Invalid Presence or absence of SARS CoV 2 nucleic acids cannot be determined. Repeat testing was performed on the submitted specimen and repeated Invalid results were obtained.  If clinically indicated, additional testing on a new specimen with an alternate test methodology [phone]) is advised.  The SARS CoV 2 RNA is generally detectable in upper and lower respiratory specimens during the acute phase of infection. The expected result is Negative. Fact Sheet for Patients:  GolfingFamily.no Fact Sheet for Healthcare Providers: https://www.hernandez-brewer.com/ This test is not yet approved or cleared by the [place] FDA and has been authorized for detection and/or diagnosis of SARS CoV 2 by FDA under an Emergency Use Authorization (EUA).  This EUA will remain in effect (meaning this test can be used) for the duration of the COVID19 d eclaration under Section 564(b)(1) of the Act, 21 U.S.C. section [phone] 3(b)(1), unless the authorization is terminated or revoked sooner. Performed at [facility], [address], [place], [place] [address]   Magnesium     Status: Abnormal   Collection Time: 04/07/19  5:21 PM  Result Value Ref Range   Magnesium 1.3 (L) 1.7 - 2.4 mg/dL    Comment: Performed at [facility], [address] [place], [place] [address]  Occult blood card to lab, stool     Status: Abnormal   Collection Time: 04/07/19  5:36 PM  Result Value Ref Range   Fecal Occult Bld POSITIVE (A) NEGATIVE    Comment: Performed at [facility], [address] [place], [place] [address]   Ct Abdomen Pelvis Wo Contrast  Result Date: 04/07/2019 CLINICAL DATA:  Weakness, intermittent nausea and diarrhea since 03/25/2019, 4 falls, unspecified abdominal pain, history stage IV chronic renal disease, GERD, hypertension, hyperlipidemia, breast cancer EXAM: CT ABDOMEN AND PELVIS WITHOUT CONTRAST TECHNIQUE: Multidetector CT imaging of the abdomen and pelvis was performed following the standard protocol without IV contrast. Sagittal and coronal MPR images reconstructed from axial data set. No oral contrast was administered COMPARISON:  03/19/2019 FINDINGS: Lower chest: Bibasilar atelectasis with questionable coexistent consolidation RIGHT lower lobe. Hepatobiliary: Gallbladder and liver normal appearance Pancreas: Atrophic pancreas without mass Spleen: Normal size with a few scattered calcified granulomata. Adrenals/Urinary Tract: Adrenal glands normal appearance. Minimally dilated renal pelves bilaterally without hydroureter or ureteral calcification. 2.2 x 2.3 cm exophytic RIGHT renal cyst. Intermediate attenuation 11 mm nodule posterior LEFT kidney image 33 versus lobulated cortex unchanged. Ureters and bladder otherwise unremarkable. Stomach/Bowel: Sigmoid diverticulosis without evidence of diverticulitis. Appendix not definitely visualized. Loop of small bowel in the RIGHT upper lateral pelvis demonstrates mild wall thickening and surrounding inflammatory changes compatible with enteritis. Several tiny foci of gas are seen either within or immediately adjacent to the small bowel wall, cannot exclude microperforation. Small bowel proximal to this is upper normal in caliber and demonstrates a small bowel stool sign suggesting prolonged transit. No definite dilatation/obstruction. Vascular/Lymphatic: Atherosclerotic calcifications aorta and iliac arteries without aneurysm. No adenopathy. Reproductive: Atrophic uterus and ovaries Other: No free air or free fluid.  No hernia.  Musculoskeletal: Osseous demineralization. IMPRESSION: Focal inflammatory process of the small bowel loop in the upper RIGHT lateral pelvis with bowel wall thickening and adjacent infiltrative changes compatible with enteritis. This could be  due to infection, focal inflammatory bowel disease, foreign body perforation. Tiny foci of gas within or immediately adjacent to the small bowel wall at this site, cannot exclude micro perforation. No definite bowel dilatation to suggest obstruction, though small bowel stool sign is seen proximal to this indicating prolonged transit. No free air or abscess identified. Findings called to Dr. Tamera Punt on 04/07/2019 at 1722 hrs. Electronically Signed   By: Lavonia Dana M.D.   On: 04/07/2019 17:23   Ct Head Wo Contrast  Result Date: 04/07/2019 CLINICAL DATA:  Weakness, multiple falls EXAM: CT HEAD WITHOUT CONTRAST TECHNIQUE: Contiguous axial images were obtained from the base of the skull through the vertex without intravenous contrast. COMPARISON:  04/04/2019 FINDINGS: Brain: No evidence of acute infarction, hemorrhage, hydrocephalus, extra-axial collection or mass lesion/mass effect. Mild periventricular white matter hypodensity. Vascular: No hyperdense vessel or unexpected calcification. Skull: Normal. Negative for fracture or focal lesion. Sinuses/Orbits: No acute finding. Other: None. IMPRESSION: No acute intracranial pathology. Electronically Signed   By: Eddie Candle M.D.   On: 04/07/2019 17:07   Dg Chest Port 1 View  Result Date: 04/07/2019 CLINICAL DATA:  83 year old female with nausea, diarrhea, weakness and frequent falls EXAM: PORTABLE CHEST 1 VIEW COMPARISON:  Prior chest x-ray 03/19/2019 FINDINGS: Stable cardiomegaly. Surgical changes of prior left mastectomy and axillary nodal dissection. Subtle patchy right basilar airspace opacity favored to reflect atelectasis in the setting of low inspiratory volumes. No pulmonary edema. No large effusion or pneumothorax. No  acute osseous abnormality. IMPRESSION: Low inspiratory volumes and stable cardiomegaly. No acute cardiopulmonary process. Electronically Signed   By: Jacqulynn Cadet M.D.   On: 04/07/2019 17:06    Pending Labs Unresulted Labs (From admission, onward)    Start     Ordered   04/07/19 1724  Gastrointestinal Panel by PCR , Stool  (Gastrointestinal Panel by PCR, Stool)  Once,   STAT     04/07/19 1723   04/07/19 1724  C Difficile Quick Screen w PCR reflex  (Gastrointestinal Panel by PCR, Stool)  Once, for 24 hours,   STAT     04/07/19 1723   04/07/19 1604  Urinalysis, Routine w reflex microscopic  ONCE - STAT,   STAT     04/07/19 1604          Vitals/Pain Today's Vitals   04/07/19 1536 04/07/19 1539 04/07/19 1540 04/07/19 1727  BP: (!) 120/50 (!) 120/50    Pulse: 86 84    Resp:  18    Temp:  98.7 F (37.1 C)    TempSrc:  Oral    SpO2: 93% 94%    Height:   5' 3"  (1.6 m)   PainSc:   0-No pain 0-No pain    Isolation Precautions Enteric precautions (UV disinfection)  Medications Medications  potassium chloride 10 mEq in 100 mL IVPB (10 mEq Intravenous New Bag/Given 04/07/19 1737)  sodium chloride 0.9 % bolus 500 mL (500 mLs Intravenous New Bag/Given 04/07/19 1727)  piperacillin-tazobactam (ZOSYN) IVPB 3.375 g (3.375 g Intravenous New Bag/Given 04/07/19 1731)    Mobility walks High fall risk   Focused Assessments Cardiac Assessment Handoff:  Cardiac Rhythm: Normal sinus rhythm Lab Results  Component Value Date   CKTOTAL 69 03/08/2014   CKMB 1.5 06/02/2011   TROPONINI <0.03 06/20/2018   Lab Results  Component Value Date   DDIMER 1.05 (H) 12/15/2014   Does the Patient currently have chest pain? No     R Recommendations: See Admitting Provider Note  Report  given to:   Additional Notes:

## 2019-04-08 LAB — RETICULOCYTES
Immature Retic Fract: 25.4 % — ABNORMAL HIGH (ref 2.3–15.9)
RBC.: 2.35 MIL/uL — ABNORMAL LOW (ref 3.87–5.11)
Retic Count, Absolute: 42.5 10*3/uL (ref 19.0–186.0)
Retic Ct Pct: 1.8 % (ref 0.4–3.1)

## 2019-04-08 LAB — CBC
HCT: 20.6 % — ABNORMAL LOW (ref 36.0–46.0)
HCT: 20.8 % — ABNORMAL LOW (ref 36.0–46.0)
HCT: 26.5 % — ABNORMAL LOW (ref 36.0–46.0)
Hemoglobin: 7 g/dL — ABNORMAL LOW (ref 12.0–15.0)
Hemoglobin: 6.9 g/dL — CL (ref 12.0–15.0)
Hemoglobin: 9 g/dL — ABNORMAL LOW (ref 12.0–15.0)
MCH: 29.8 pg (ref 26.0–34.0)
MCH: 29.2 pg (ref 26.0–34.0)
MCH: 28.6 pg (ref 26.0–34.0)
MCHC: 34 g/dL (ref 30.0–36.0)
MCHC: 33.2 g/dL (ref 30.0–36.0)
MCHC: 34 g/dL (ref 30.0–36.0)
MCV: 87.7 fL (ref 80.0–100.0)
MCV: 88.1 fL (ref 80.0–100.0)
MCV: 84.1 fL (ref 80.0–100.0)
Platelets: 361 10*3/uL (ref 150–400)
Platelets: 376 10*3/uL (ref 150–400)
Platelets: 345 10*3/uL (ref 150–400)
RBC: 2.35 MIL/uL — ABNORMAL LOW (ref 3.87–5.11)
RBC: 2.36 MIL/uL — ABNORMAL LOW (ref 3.87–5.11)
RBC: 3.15 MIL/uL — ABNORMAL LOW (ref 3.87–5.11)
RDW: 14 % (ref 11.5–15.5)
RDW: 14.1 % (ref 11.5–15.5)
RDW: 16.3 % — ABNORMAL HIGH (ref 11.5–15.5)
WBC: 4.1 10*3/uL (ref 4.0–10.5)
WBC: 4.2 10*3/uL (ref 4.0–10.5)
WBC: 3.4 10*3/uL — ABNORMAL LOW (ref 4.0–10.5)
nRBC: 0 % (ref 0.0–0.2)
nRBC: 0 % (ref 0.0–0.2)
nRBC: 0 % (ref 0.0–0.2)

## 2019-04-08 LAB — BASIC METABOLIC PANEL
Anion gap: 17 — ABNORMAL HIGH (ref 5–15)
BUN: 47 mg/dL — ABNORMAL HIGH (ref 8–23)
CO2: 26 mmol/L (ref 22–32)
Calcium: 7.3 mg/dL — ABNORMAL LOW (ref 8.9–10.3)
Chloride: 92 mmol/L — ABNORMAL LOW (ref 98–111)
Creatinine, Ser: 3.4 mg/dL — ABNORMAL HIGH (ref 0.44–1.00)
GFR calc Af Amer: 14 mL/min — ABNORMAL LOW (ref >60)
GFR calc non Af Amer: 12 mL/min — ABNORMAL LOW (ref >60)
Glucose, Bld: 86 mg/dL (ref 70–99)
Potassium: 3.5 mmol/L (ref 3.5–5.1)
Sodium: 135 mmol/L (ref 135–145)

## 2019-04-08 LAB — PREPARE RBC (CROSSMATCH)

## 2019-04-08 LAB — SARS CORONAVIRUS 2 BY RT PCR (HOSPITAL ORDER, PERFORMED IN ~~LOC~~ HOSPITAL LAB): SARS Coronavirus 2: NEGATIVE

## 2019-04-08 LAB — FOLATE: Folate: 61.2 ng/mL (ref >5.9)

## 2019-04-08 LAB — GLUCOSE, CAPILLARY: Glucose-Capillary: 101 mg/dL — ABNORMAL HIGH (ref 70–99)

## 2019-04-08 LAB — ABO/RH: ABO/RH(D): A POS

## 2019-04-08 MED ORDER — SODIUM CHLORIDE 0.9 % IV SOLN: 125.0000 mg | Freq: Once | INTRAVENOUS | Status: DC

## 2019-04-08 MED ORDER — CALCIUM CARBONATE-VITAMIN D 500-200 MG-UNIT PO TABS
1.0000 | ORAL_TABLET | Freq: Every day | ORAL | Status: DC
  Administered 2019-04-08 – 2019-04-12 (×4): 1 via ORAL
  Filled 2019-04-08 (×5): qty 1

## 2019-04-08 MED ORDER — LOPERAMIDE HCL 2 MG PO CAPS: 2.0000 mg | ORAL_CAPSULE | Freq: Four times a day (QID) | ORAL | Status: DC | PRN

## 2019-04-08 MED ORDER — SODIUM CHLORIDE 0.9 % IV SOLN
510.0000 mg | Freq: Once | INTRAVENOUS | Status: AC
  Administered 2019-04-08: 510 mg via INTRAVENOUS
  Filled 2019-04-08: qty 17

## 2019-04-08 MED ORDER — SODIUM CHLORIDE 0.9% IV SOLUTION
Freq: Once | INTRAVENOUS | Status: AC
  Administered 2019-04-08: 14:00:00 via INTRAVENOUS

## 2019-04-08 MED ORDER — VITAMIN C 500 MG PO TABS
500.0000 mg | ORAL_TABLET | Freq: Every day | ORAL | Status: DC
  Administered 2019-04-08 – 2019-04-12 (×5): 500 mg via ORAL
  Filled 2019-04-08 (×5): qty 1

## 2019-04-08 NOTE — Evaluation (Signed)
Physical Therapy Evaluation Patient Details Name: Samantha Mckenzie MRN: [id number] DOB: 1936/05/02 Today's Date: 04/08/2019   History of Present Illness  Pt is an 83 y.o. female admitted 04/07/19 with abdominal pain, generalized weakness and falls. Worked up for focal small bowel entiritis; plan for bowel rest and anitbiotics. Head CT negative for acute abnormality. PMH includes CKD 4, HTN, gout, breast CA, macular degeneration. Of note, recent admission 03/19/19-03/27/19 with CAP.    Clinical Impression  Pt presents with an overall decrease in functional mobility secondary to above. PTA, pt ambulatory with RW, lives with husband who she assists as needed. Today, pt ambulatory with RW at supervision-level; limited by fatigue. Noted Hgb 6.9 this AM. SpO2 down to 88% on RA. Pt would benefit from continued acute PT services to maximize functional mobility and independence prior to d/c with HHPT services.     Follow Up Recommendations Home health PT;Supervision - Intermittent    Equipment Recommendations  None recommended by PT    Recommendations for Other Services       Precautions / Restrictions Precautions Precautions: Fall Restrictions Weight Bearing Restrictions: No      Mobility  Bed Mobility               General bed mobility comments: Received sitting in recliner  Transfers Overall transfer level: Needs assistance Equipment used: Rolling walker (2 wheeled) Transfers: Sit to/from Stand Sit to Stand: Supervision         General transfer comment: Able to stand from bed and toilet to RW; supervision for safety  Ambulation/Gait Ambulation/Gait assistance: Supervision Gait Distance (Feet): 120 Feet Assistive device: Rolling walker (2 wheeled) Gait Pattern/deviations: Step-through pattern;Decreased stride length Gait velocity: Decreased Gait velocity interpretation: <1.8 ft/sec, indicate of risk for recurrent falls General Gait Details: Slow, fatigued gait with RW;  supervision for safety. Reports limited by fatigue  Stairs            Wheelchair Mobility    Modified Rankin (Stroke Patients Only)       Balance Overall balance assessment: Needs assistance Sitting-balance support: No upper extremity supported;Feet supported Sitting balance-Leahy Scale: Good     Standing balance support: During functional activity;No upper extremity supported Standing balance-Leahy Scale: Fair Standing balance comment: Can static stand without UE support; dynamic stability improved with at least single UE support                             Pertinent Vitals/Pain Pain Assessment: No/denies pain    Home Living Family/patient expects to be discharged to:: Private residence Living Arrangements: Spouse/significant other Available Help at Discharge: Family;Available PRN/intermittently Type of Home: House Home Access: Stairs to enter   [employer] of Steps: 2-3 Home Layout: One level Home Equipment: Walker - 2 wheels;Walker - 4 wheels      Prior Function Level of Independence: Independent with assistive device(s)         Comments: Ambulatory with RW     Hand Dominance        Extremity/Trunk Assessment   Upper Extremity Assessment Upper Extremity Assessment: Generalized weakness    Lower Extremity Assessment Lower Extremity Assessment: Generalized weakness    Cervical / Trunk Assessment Cervical / Trunk Assessment: Normal  Communication   Communication: HOH  Cognition Arousal/Alertness: Awake/alert Behavior During Therapy: WFL for tasks assessed/performed Overall Cognitive Status: Within Functional Limits for tasks assessed  General Comments General comments (skin integrity, edema, etc.): SpO2 down to 88% on RA, replaced 1L O2 [place]    Exercises     Assessment/Plan    PT Assessment Patient needs continued PT services  PT Problem List Decreased  activity tolerance;Decreased balance;Decreased mobility;Decreased strength       PT Treatment Interventions DME instruction;Gait training;Functional mobility training;Therapeutic activities;Therapeutic exercise;Stair training;Balance training;Patient/family education    PT Goals (Current goals can be found in the Care Plan section)  Acute Rehab PT Goals Patient Stated Goal: To get some rest PT Goal Formulation: With patient Time For Goal Achievement: 04/22/19 Potential to Achieve Goals: Good    Frequency Min 3X/week   Barriers to discharge Decreased caregiver support      Co-evaluation               AM-PAC PT "6 Clicks" Mobility  Outcome Measure Help needed turning from your back to your side while in a flat bed without using bedrails?: None Help needed moving from lying on your back to sitting on the side of a flat bed without using bedrails?: None Help needed moving to and from a bed to a chair (including a wheelchair)?: None Help needed standing up from a chair using your arms (e.g., wheelchair or bedside chair)?: A Little Help needed to walk in hospital room?: A Little Help needed climbing 3-5 steps with a railing? : A Little 6 Click Score: 21    End of Session Equipment Utilized During Treatment: Gait belt Activity Tolerance: Patient tolerated treatment well Patient left: in chair;with call bell/phone within reach;with chair alarm set Nurse Communication: Mobility status PT Visit Diagnosis: Muscle weakness (generalized) (M62.81);Unsteadiness on feet (R26.81)    Time: 5726-2035 PT Time Calculation (min) (ACUTE ONLY): 21 min   Charges:   PT Evaluation $PT Eval Moderate Complexity: [place], PT, DPT Acute Rehabilitation Services  Pager [phone] Office [place] 04/08/2019, 2:42 PM

## 2019-04-08 NOTE — Progress Notes (Signed)
OT Cancellation Note  Patient Details Name: JEZELLE GULLICK MRN: [id number] DOB: January 22, 1936   Cancelled Treatment:    Reason Eval/Treat Not Completed: Fatigue/lethargy limiting ability to participate;Medical issues which prohibited therapy(RN recommends to cancel session due to decreased hemoglobin level. Likely fatigue and weakness during session.)  Marius Ditch 04/08/2019, 10:11 AM

## 2019-04-08 NOTE — Progress Notes (Signed)
CRITICAL VALUE ALERT  Critical Value: Hemoglobin 6.9  Date & Time Notied: 04/08/2019 1368  Provider Notified: Domenica Reamer, MD  Orders Received/Actions taken: Text paged via [place]; Awaiting orders.

## 2019-04-08 NOTE — Progress Notes (Signed)
PROGRESS NOTE  Samantha Mckenzie EUM:[id number] DOB: 09-25-1936 DOA: 04/07/2019 PCP: Colon Branch, MD  Brief History   Samantha Mckenzie is a 83 y.o. female with medical history significant of hypertension, hyperlipidemia, diet-controlled diabetes, GERD, gout, depression, breast cancer (surgery radiation and chemotherapy), CKD stage IV, dCHF, who presents with intermittent diarrhea, abdominal pain, generalized weakness and fall.  Patient was recently hospitalized from 6/8-6/16 due to community-acquired pneumonia.  Patient had negative COVID-19 testing in that admission.  Patient states that she has been having intermittent diarrhea and abdominal pain for more than 2 weeks.  She states that she has been having 2-3 watery diarrhea each day, but no diarrhea today.  She also has intermittent abdominal pain, which is located in the right lower quadrant. She states that her AP could be very severe, sharp, nonradiating, but currently it has subsided.  Patient does not have vomiting currently.  No fever or chills.  Patient states that she has generalized weakness, but no unilateral numbness or tingling in extremities, No facial droop or slurred speech.  Patient states that she fell several times at home, at least one time she injured her head.  Patient states that she still has some cough with little brownish colored mucus production.  Denies shortness of breath, chest pain, runny nose or sore throat.  Patient states that she did not noticed dark stool or blood in stool, but his FOBT is positive in ED today.  Patient denies symptoms of UTI.  The patient underwent an EGD on 01/13/15 by Dr. Henrene Pastor that was normal. She also underwent a colonoscopy by Dr. Henrene Pastor on 09/13/2011 that demonstrated moderate diverticulosis.  Consultants   General surgery  Procedures   None  Antibiotics   Anti-infectives (From admission, onward)   Start     Dose/Rate Route Frequency Ordered Stop   04/08/19 0300  piperacillin-tazobactam  (ZOSYN) IVPB 2.25 g     2.25 g 100 mL/hr over 30 Minutes Intravenous Every 8 hours 04/07/19 2205     04/07/19 1730  piperacillin-tazobactam (ZOSYN) IVPB 3.375 g     3.375 g 100 mL/hr over 30 Minutes Intravenous  Once 04/07/19 1723 04/07/19 1801         Subjective  The patient is resting quietly. She continues to complain of abdominal pain.  Objective   Vitals:  Vitals:   04/08/19 1403 04/08/19 1431  BP: (!) 130/44 (!) 129/57  Pulse: 78 74  Resp: 17 17  Temp: 97.9 F (36.6 C) 97.9 F (36.6 C)  SpO2: 97% 100%    Exam:  Constitutional:   The patient is somnolent, but easily arouseable. No acute distress. Respiratory:   No increased work of breathing.  No wheezes, rales, or rhonchi.  No tactile fremitus. Cardiovascular:   Regular rate and rhythm.  No murmurs, ectopy, or gallups.  No lateral PMI. No thrills.  Abdomen:   Abdomen is soft, non-distended. Positive for diffuse tenderness worst on the right side.  No hernias, masses, or organomegaly.  Hypoactive bowel sounds. Musculoskeletal:   No cyanosis, clubbing, or edema. Skin:   No rashes, lesions, ulcers  palpation of skin: no induration or nodules Neurologic:   CN 2-12 intact  Sensation all 4 extremities intact Psychiatric:   Mental status o Mood, affect appropriate o Orientation to person, place, time   judgment and insight appear intact   I have personally reviewed the following:   Today's Data   CBC, BMP, Vitals  Micro Data   Blood cultures  Scheduled  Meds:  sodium chloride   Intravenous Once   allopurinol  100 mg Oral Daily   amLODipine  10 mg Oral Daily   atorvastatin  20 mg Oral Daily   calcium-vitamin D  1 tablet Oral Daily   gabapentin  200 mg Oral QHS   metoprolol tartrate  25 mg Oral BID   ondansetron  4 mg Oral TID WC   pantoprazole  40 mg Oral BID   vitamin B-12  1,000 mcg Oral Daily   vitamin C  500 mg Oral Daily   Continuous Infusions:   piperacillin-tazobactam (ZOSYN)  IV 2.25 g (04/08/19 1206)    Principal Problem:   Enteritis Active Problems:   Diabetes mellitus type 2 with complications (HCC)   Hyperlipidemia   GOUT   Normocytic anemia   Essential hypertension   GERD   Depression   Generalized weakness   Hypomagnesemia   Acute respiratory failure with hypoxia (HCC)   Acute renal failure superimposed on stage 4 chronic kidney disease (HCC)   Chronic diastolic CHF (congestive heart failure) (HCC)   Hypokalemia   Hypocalcemia   Fall   Occult blood positive stool   LOS: 1 day    A & P  Enteritis: Patient has been having intermittent diarrhea and abdominal pain.  CT scan showed possible enteritis, cannot rule out small bowel microperforation.  Patient does not have fever or leukocytosis.  Clinically does not meet criteria for sepsis.  Currently hemodynamically stable.  General surgeon, Dr. Kieth Brightly was consulted. She was admitted to a med/surg bed as inpatient. Blood cultures have been drawn. She is receiving IV Zosyn. GI pathogen panel is pending. I appreciate General Surgery's help. Plan for now is for bowel rest and antibiotics.   Diabetes mellitus type 2 with complications: Diet controlled. ([place]): Last A1c 6.3 on 06/17/18, well controled.  Blood sugar 131. Clear liquids. Will follow FSBS with SSI.   Hyperlipidemia: lipitor  Gout: Allopurinol  Normocytic anemia and positive FOBT: hemoglobin dropped from 10-7.3.  No gross rectal bleeding. Continue PPI bid. IV fluids. Monitor hemoglobin and transfuse for hemoglobin less than 7.0. Avoid NSAIDS. The patient has been typed and screened.   HTN: Continue home medications: Amlodipine, metoprolol. Hold for low blood pressure. IV hydralazine prn  GERD: Protonix bid.  Depression: Hold Remeron due to QTC prolongation.  Acute respiratory failure with hypoxia ([place]): Most likely due to incomplete recovery from recently treated community-acquired pneumonia.  Patient  had negative COVID-19 test in that admission.  Still has some cough.  Chest x-ray did not show obvious infiltration. Symptomatic treatment for cough, wheezes with as needed albuterol and mucinex. Repeat COVID-19 is negative.   Acute renal failure superimposed on stage 4 chronic kidney disease: Baseline Cre is 1.7-2.4. The patient's creatinine is 3.40 this morning. This is likely due to prerenal secondary to dehydration and continuation of diuretics. Lasix has been held. Monitor creatinine, electrolytes. Hold lasix, avoid nephrotoxic substances and hypotension.  Chronic diastolic CHF (congestive heart failure) ([place]): 2D echo on 06/02/2017 showed EF of 55% with grade 1 diastolic dysfunction.  Patient does not have shortness of breath, no DVT.  No pulmonary edema on chest x-ray.  CHF seem to be compensated. laisx is being held due to worsening renal function. Continue metoprolol with parameters to hold for hypotension and low heart rate.  Hypokalemia, Hypocalcemia and hypomagnesemia: Supplement and monitor.  Fall and generalized weakness: Patient fell 4 times, most likely due to generalized weakness.  Her generalized weakness is likely multifactorial  etiology given multiple comorbidities and acute issues as listed above.  CT head is negative for acute intracranial abnormalities.  No focal neurologic findings on physical examination.  Low suspicions for stroke. PT/OT. [place] management and social work will be consulted for likely discharge to SNF.  I have seen and examined this patient myself. I have spent 35 minutes in her evaluation and care.  DVT prophylaxis: SCD's Code Status: DNR Family Communication: None available Disposition Plan: tbd   Theodore Virgin, DO [facility] Direct contact: see [email or web address]  7PM-7AM contact night coverage as above 04/08/2019, 3:37 PM  LOS: 1 day

## 2019-04-08 NOTE — Progress Notes (Addendum)
Subjective: Alert.  Feeling a little better.  Pain mild.  Right-sided.  No stools during the night.  No vomiting. Urine output 600 cc overnight. Afebrile.  Heart rate 90s.  Hemoglobin 6.9.  Baseline 7.3.  WBC 4200.  Creatinine 3.4.  Similar to yesterday.  Creatinine was 1.88 on June 10.  Potassium up to 3.5  Objective: Vital signs in last 24 hours: Temp:  [98.7 F (37.1 C)-99.3 F (37.4 C)] 99 F (37.2 C) (06/28 0452) Pulse Rate:  [84-93] 88 (06/28 0906) Resp:  [15-32] 16 (06/27 2021) BP: (120-152)/(48-70) 130/70 (06/28 0906) SpO2:  [92 %-100 %] 95 % (06/28 0452) Weight:  [59 kg] 59 kg (06/27 2021)    Intake/Output from previous day: 06/27 0701 - 06/28 0700 In: 200 [IV Piggyback:200] Out: 600 [Urine:600] Intake/Output this shift: No intake/output data recorded.   Physical Exam   Constitutional: She is oriented to person, place, and time. She appears well-developed and well-nourished.  Minimal discomfort Eyes: Pupils are equal, round, and reactive to light. Conjunctivae and EOM are normal.  Cardiovascular: Normal rate and regular rhythm.  Respiratory: Effort normal and breath sounds normal.  GI: Soft. Bowel sounds are normal. She exhibits no distension.  Tender with guarding right flank.  No mass..   Musculoskeletal: Normal range of motion.  Neurological: She is alert and oriented to person, place, and time.  Skin: Skin is warm and dry.  Psychiatric: She has a normal mood and affect. Her behavior is normal.   Lab Results:  Recent Labs    04/08/19 0225 04/08/19 0824  WBC 4.1 4.2  HGB 7.0* 6.9*  HCT 20.6* 20.8*  PLT 361 376   BMET Recent Labs    04/07/19 1616 04/08/19 0824  NA 130* 135  K 2.3* 3.5  CL 86* 92*  CO2 28 26  GLUCOSE 131* 86  BUN 55* 47*  CREATININE 3.54* 3.40*  CALCIUM 6.6* 7.3*   PT/INR Recent Labs    04/07/19 2157  LABPROT 14.1  INR 1.1   ABG No results for input(s): PHART, HCO3 in the last 72 hours.  Invalid input(s): PCO2,  PO2  Studies/Results: Ct Abdomen Pelvis Wo Contrast  Result Date: 04/07/2019 CLINICAL DATA:  Weakness, intermittent nausea and diarrhea since 03/25/2019, 4 falls, unspecified abdominal pain, history stage IV chronic renal disease, GERD, hypertension, hyperlipidemia, breast cancer EXAM: CT ABDOMEN AND PELVIS WITHOUT CONTRAST TECHNIQUE: Multidetector CT imaging of the abdomen and pelvis was performed following the standard protocol without IV contrast. Sagittal and coronal MPR images reconstructed from axial data set. No oral contrast was administered COMPARISON:  03/19/2019 FINDINGS: Lower chest: Bibasilar atelectasis with questionable coexistent consolidation RIGHT lower lobe. Hepatobiliary: Gallbladder and liver normal appearance Pancreas: Atrophic pancreas without mass Spleen: Normal size with a few scattered calcified granulomata. Adrenals/Urinary Tract: Adrenal glands normal appearance. Minimally dilated renal pelves bilaterally without hydroureter or ureteral calcification. 2.2 x 2.3 cm exophytic RIGHT renal cyst. Intermediate attenuation 11 mm nodule posterior LEFT kidney image 33 versus lobulated cortex unchanged. Ureters and bladder otherwise unremarkable. Stomach/Bowel: Sigmoid diverticulosis without evidence of diverticulitis. Appendix not definitely visualized. Loop of small bowel in the RIGHT upper lateral pelvis demonstrates mild wall thickening and surrounding inflammatory changes compatible with enteritis. Several tiny foci of gas are seen either within or immediately adjacent to the small bowel wall, cannot exclude microperforation. Small bowel proximal to this is upper normal in caliber and demonstrates a small bowel stool sign suggesting prolonged transit. No definite dilatation/obstruction. Vascular/Lymphatic: Atherosclerotic calcifications aorta and iliac  arteries without aneurysm. No adenopathy. Reproductive: Atrophic uterus and ovaries Other: No free air or free fluid.  No hernia.  Musculoskeletal: Osseous demineralization. IMPRESSION: Focal inflammatory process of the small bowel loop in the upper RIGHT lateral pelvis with bowel wall thickening and adjacent infiltrative changes compatible with enteritis. This could be due to infection, focal inflammatory bowel disease, foreign body perforation. Tiny foci of gas within or immediately adjacent to the small bowel wall at this site, cannot exclude micro perforation. No definite bowel dilatation to suggest obstruction, though small bowel stool sign is seen proximal to this indicating prolonged transit. No free air or abscess identified. Findings called to Dr. Tamera Punt on 04/07/2019 at 1722 hrs. Electronically Signed   By: Lavonia Dana M.D.   On: 04/07/2019 17:23   Ct Head Wo Contrast  Result Date: 04/07/2019 CLINICAL DATA:  Weakness, multiple falls EXAM: CT HEAD WITHOUT CONTRAST TECHNIQUE: Contiguous axial images were obtained from the base of the skull through the vertex without intravenous contrast. COMPARISON:  04/04/2019 FINDINGS: Brain: No evidence of acute infarction, hemorrhage, hydrocephalus, extra-axial collection or mass lesion/mass effect. Mild periventricular white matter hypodensity. Vascular: No hyperdense vessel or unexpected calcification. Skull: Normal. Negative for fracture or focal lesion. Sinuses/Orbits: No acute finding. Other: None. IMPRESSION: No acute intracranial pathology. Electronically Signed   By: Eddie Candle M.D.   On: 04/07/2019 17:07   Dg Chest Port 1 View  Result Date: 04/07/2019 CLINICAL DATA:  83 year old female with nausea, diarrhea, weakness and frequent falls EXAM: PORTABLE CHEST 1 VIEW COMPARISON:  Prior chest x-ray 03/19/2019 FINDINGS: Stable cardiomegaly. Surgical changes of prior left mastectomy and axillary nodal dissection. Subtle patchy right basilar airspace opacity favored to reflect atelectasis in the setting of low inspiratory volumes. No pulmonary edema. No large effusion or pneumothorax. No  acute osseous abnormality. IMPRESSION: Low inspiratory volumes and stable cardiomegaly. No acute cardiopulmonary process. Electronically Signed   By: Jacqulynn Cadet M.D.   On: 04/07/2019 17:06    Anti-infectives: Anti-infectives (From admission, onward)   Start     Dose/Rate Route Frequency Ordered Stop   04/08/19 0300  piperacillin-tazobactam (ZOSYN) IVPB 2.25 g     2.25 g 100 mL/hr over 30 Minutes Intravenous Every 8 hours 04/07/19 2205     04/07/19 1730  piperacillin-tazobactam (ZOSYN) IVPB 3.375 g     3.375 g 100 mL/hr over 30 Minutes Intravenous  Once 04/07/19 1723 04/07/19 1801      Assessment/Plan:   Focal small bowel enteritis right lower quadrant.  Small air bubbles in area.  No abscess -Etiologies to consider are infectious, inflammatory, foreign body perforation -Ischemic, neoplastic, or Meckel's seem less likely -Plan bowel rest and antibiotics -We will follow.  Reserve operative intervention for failure to resolve or clinical progression  Anemia.  May require transfusion History of breast cancer CKD 4 followed by nephrology GERD Gout Hypertension    LOS: 1 day    Samantha Mckenzie 04/08/2019

## 2019-04-09 LAB — COMPREHENSIVE METABOLIC PANEL
ALT: 25 U/L (ref 0–44)
ALT: 23 U/L (ref 0–44)
AST: 39 U/L (ref 15–41)
AST: 30 U/L (ref 15–41)
Albumin: 2.1 g/dL — ABNORMAL LOW (ref 3.5–5.0)
Albumin: 1.7 g/dL — ABNORMAL LOW (ref 3.5–5.0)
Alkaline Phosphatase: 63 U/L (ref 38–126)
Alkaline Phosphatase: 57 U/L (ref 38–126)
Anion gap: 16 — ABNORMAL HIGH (ref 5–15)
Anion gap: 13 (ref 5–15)
BUN: 55 mg/dL — ABNORMAL HIGH (ref 8–23)
BUN: 43 mg/dL — ABNORMAL HIGH (ref 8–23)
CO2: 28 mmol/L (ref 22–32)
CO2: 28 mmol/L (ref 22–32)
Calcium: 6.6 mg/dL — ABNORMAL LOW (ref 8.9–10.3)
Calcium: 7.9 mg/dL — ABNORMAL LOW (ref 8.9–10.3)
Chloride: 86 mmol/L — ABNORMAL LOW (ref 98–111)
Chloride: 96 mmol/L — ABNORMAL LOW (ref 98–111)
Creatinine, Ser: 3.54 mg/dL — ABNORMAL HIGH (ref 0.44–1.00)
Creatinine, Ser: 3.24 mg/dL — ABNORMAL HIGH (ref 0.44–1.00)
GFR calc Af Amer: 13 mL/min — ABNORMAL LOW (ref >60)
GFR calc Af Amer: 15 mL/min — ABNORMAL LOW (ref >60)
GFR calc non Af Amer: 12 mL/min — ABNORMAL LOW (ref >60)
GFR calc non Af Amer: 13 mL/min — ABNORMAL LOW (ref >60)
Glucose, Bld: 131 mg/dL — ABNORMAL HIGH (ref 70–99)
Glucose, Bld: 84 mg/dL (ref 70–99)
Potassium: 2.3 mmol/L — CL (ref 3.5–5.1)
Potassium: 3.3 mmol/L — ABNORMAL LOW (ref 3.5–5.1)
Sodium: 130 mmol/L — ABNORMAL LOW (ref 135–145)
Sodium: 137 mmol/L (ref 135–145)
Total Bilirubin: 0.6 mg/dL (ref 0.3–1.2)
Total Bilirubin: 1.3 mg/dL — ABNORMAL HIGH (ref 0.3–1.2)
Total Protein: 6.2 g/dL — ABNORMAL LOW (ref 6.5–8.1)
Total Protein: 5.8 g/dL — ABNORMAL LOW (ref 6.5–8.1)

## 2019-04-09 LAB — CBC
HCT: 25 % — ABNORMAL LOW (ref 36.0–46.0)
Hemoglobin: 8.4 g/dL — ABNORMAL LOW (ref 12.0–15.0)
MCH: 28.4 pg (ref 26.0–34.0)
MCHC: 33.6 g/dL (ref 30.0–36.0)
MCV: 84.5 fL (ref 80.0–100.0)
Platelets: 366 10*3/uL (ref 150–400)
RBC: 2.96 MIL/uL — ABNORMAL LOW (ref 3.87–5.11)
RDW: 16.8 % — ABNORMAL HIGH (ref 11.5–15.5)
WBC: 4 10*3/uL (ref 4.0–10.5)
nRBC: 0 % (ref 0.0–0.2)

## 2019-04-09 LAB — TYPE AND SCREEN
ABO/RH(D): A POS
Antibody Screen: NEGATIVE
Unit division: 0

## 2019-04-09 LAB — HEMOGLOBIN A1C
Hgb A1c MFr Bld: 6.4 % — ABNORMAL HIGH (ref 4.8–5.6)
Mean Plasma Glucose: 137 mg/dL

## 2019-04-09 LAB — BPAM RBC
Blood Product Expiration Date: 202007142359
ISSUE DATE / TIME: 202006281349
Unit Type and Rh: 6200

## 2019-04-09 LAB — GLUCOSE, CAPILLARY: Glucose-Capillary: 90 mg/dL (ref 70–99)

## 2019-04-09 MED ORDER — POTASSIUM CHLORIDE 10 MEQ/100ML IV SOLN
10.0000 meq | INTRAVENOUS | Status: AC
  Administered 2019-04-09 (×4): 10 meq via INTRAVENOUS
  Filled 2019-04-09 (×4): qty 100

## 2019-04-09 NOTE — Progress Notes (Signed)
Subjective/Chief Complaint: Feels better, no abd pain, passing flatus, no bm   Objective: Vital signs in last 24 hours: Temp:  [97.9 F (36.6 C)-99.3 F (37.4 C)] 98.3 F (36.8 C) (06/29 0449) Pulse Rate:  [74-88] 80 (06/29 0449) Resp:  [17-19] 17 (06/29 0449) BP: (117-130)/(44-70) 123/49 (06/29 0449) SpO2:  [93 %-100 %] 96 % (06/29 0449)    Intake/Output from previous day: 06/28 0701 - 06/29 0700 In: 150 [IV Piggyback:150] Out: 150 [Urine:150] Intake/Output this shift: No intake/output data recorded.  Constitutional: She isoriented to person, place, and time. She appearswell-developedand well-nourished.   Lungs clear cv rrr  PR:FFMB nd/nt bs present Neurological: She isalertand oriented to person, place, and time.  Skin: Skin iswarmand dry.  Psychiatric: She has anormal mood and affect. Herbehavior is normal.  Lab Results:  Recent Labs    04/08/19 2037 04/09/19 0234  WBC 3.4* 4.0  HGB 9.0* 8.4*  HCT 26.5* 25.0*  PLT 345 366   BMET Recent Labs    04/08/19 0824 04/09/19 0234  NA 135 137  K 3.5 3.3*  CL 92* 96*  CO2 26 28  GLUCOSE 86 84  BUN 47* 43*  CREATININE 3.40* 3.24*  CALCIUM 7.3* 7.9*   PT/INR Recent Labs    04/07/19 2157  LABPROT 14.1  INR 1.1   ABG No results for input(s): PHART, HCO3 in the last 72 hours.  Invalid input(s): PCO2, PO2  Studies/Results: Ct Abdomen Pelvis Wo Contrast  Result Date: 04/07/2019 CLINICAL DATA:  Weakness, intermittent nausea and diarrhea since 03/25/2019, 4 falls, unspecified abdominal pain, history stage IV chronic renal disease, GERD, hypertension, hyperlipidemia, breast cancer EXAM: CT ABDOMEN AND PELVIS WITHOUT CONTRAST TECHNIQUE: Multidetector CT imaging of the abdomen and pelvis was performed following the standard protocol without IV contrast. Sagittal and coronal MPR images reconstructed from axial data set. No oral contrast was administered COMPARISON:  03/19/2019 FINDINGS: Lower chest:  Bibasilar atelectasis with questionable coexistent consolidation RIGHT lower lobe. Hepatobiliary: Gallbladder and liver normal appearance Pancreas: Atrophic pancreas without mass Spleen: Normal size with a few scattered calcified granulomata. Adrenals/Urinary Tract: Adrenal glands normal appearance. Minimally dilated renal pelves bilaterally without hydroureter or ureteral calcification. 2.2 x 2.3 cm exophytic RIGHT renal cyst. Intermediate attenuation 11 mm nodule posterior LEFT kidney image 33 versus lobulated cortex unchanged. Ureters and bladder otherwise unremarkable. Stomach/Bowel: Sigmoid diverticulosis without evidence of diverticulitis. Appendix not definitely visualized. Loop of small bowel in the RIGHT upper lateral pelvis demonstrates mild wall thickening and surrounding inflammatory changes compatible with enteritis. Several tiny foci of gas are seen either within or immediately adjacent to the small bowel wall, cannot exclude microperforation. Small bowel proximal to this is upper normal in caliber and demonstrates a small bowel stool sign suggesting prolonged transit. No definite dilatation/obstruction. Vascular/Lymphatic: Atherosclerotic calcifications aorta and iliac arteries without aneurysm. No adenopathy. Reproductive: Atrophic uterus and ovaries Other: No free air or free fluid.  No hernia. Musculoskeletal: Osseous demineralization. IMPRESSION: Focal inflammatory process of the small bowel loop in the upper RIGHT lateral pelvis with bowel wall thickening and adjacent infiltrative changes compatible with enteritis. This could be due to infection, focal inflammatory bowel disease, foreign body perforation. Tiny foci of gas within or immediately adjacent to the small bowel wall at this site, cannot exclude micro perforation. No definite bowel dilatation to suggest obstruction, though small bowel stool sign is seen proximal to this indicating prolonged transit. No free air or abscess identified.  Findings called to Dr. Tamera Punt on 04/07/2019 at 1722  hrs. Electronically Signed   By: Lavonia Dana M.D.   On: 04/07/2019 17:23   Ct Head Wo Contrast  Result Date: 04/07/2019 CLINICAL DATA:  Weakness, multiple falls EXAM: CT HEAD WITHOUT CONTRAST TECHNIQUE: Contiguous axial images were obtained from the base of the skull through the vertex without intravenous contrast. COMPARISON:  04/04/2019 FINDINGS: Brain: No evidence of acute infarction, hemorrhage, hydrocephalus, extra-axial collection or mass lesion/mass effect. Mild periventricular white matter hypodensity. Vascular: No hyperdense vessel or unexpected calcification. Skull: Normal. Negative for fracture or focal lesion. Sinuses/Orbits: No acute finding. Other: None. IMPRESSION: No acute intracranial pathology. Electronically Signed   By: Eddie Candle M.D.   On: 04/07/2019 17:07   Dg Chest Port 1 View  Result Date: 04/07/2019 CLINICAL DATA:  83 year old female with nausea, diarrhea, weakness and frequent falls EXAM: PORTABLE CHEST 1 VIEW COMPARISON:  Prior chest x-ray 03/19/2019 FINDINGS: Stable cardiomegaly. Surgical changes of prior left mastectomy and axillary nodal dissection. Subtle patchy right basilar airspace opacity favored to reflect atelectasis in the setting of low inspiratory volumes. No pulmonary edema. No large effusion or pneumothorax. No acute osseous abnormality. IMPRESSION: Low inspiratory volumes and stable cardiomegaly. No acute cardiopulmonary process. Electronically Signed   By: Jacqulynn Cadet M.D.   On: 04/07/2019 17:06    Anti-infectives: Anti-infectives (From admission, onward)   Start     Dose/Rate Route Frequency Ordered Stop   04/08/19 0300  piperacillin-tazobactam (ZOSYN) IVPB 2.25 g     2.25 g 100 mL/hr over 30 Minutes Intravenous Every 8 hours 04/07/19 2205     04/07/19 1730  piperacillin-tazobactam (ZOSYN) IVPB 3.375 g     3.375 g 100 mL/hr over 30 Minutes Intravenous  Once 04/07/19 1723 04/07/19 1801       Assessment/Plan: Focal small bowel enteritis right lower quadrant.   -Etiologies to consider are infectious, inflammatory, foreign body perforation -doing better clinically, will give clear liquids today -continue abx - Reserve operative intervention for failure to resolve or clinical progression Anemia.   History of breast cancer CKD 4 followed by nephrology GERD Gout Hypertension dvt proph per trh  Rolm Bookbinder 04/09/2019

## 2019-04-09 NOTE — Telephone Encounter (Signed)
Spoke with son [name] and stated pt is hospitalized. Will call to schedule HFU when pt come out of hospital. Done

## 2019-04-09 NOTE — Telephone Encounter (Signed)
FYI

## 2019-04-09 NOTE — Progress Notes (Signed)
PROGRESS NOTE  Samantha Mckenzie JIR:[id number] DOB: 03-22-1936 DOA: 04/07/2019 PCP: Colon Branch, MD  Brief History   Samantha Mckenzie is a 83 y.o. female with medical history significant of hypertension, hyperlipidemia, diet-controlled diabetes, GERD, gout, depression, breast cancer (surgery radiation and chemotherapy), CKD stage IV, dCHF, who presents with intermittent diarrhea, abdominal pain, generalized weakness and fall.  Patient was recently hospitalized from 6/8-6/16 due to community-acquired pneumonia.  Patient had negative COVID-19 testing in that admission.  Patient states that she has been having intermittent diarrhea and abdominal pain for more than 2 weeks.  She states that she has been having 2-3 watery diarrhea each day, but no diarrhea today.  She also has intermittent abdominal pain, which is located in the right lower quadrant. She states that her AP could be very severe, sharp, nonradiating, but currently it has subsided.  Patient does not have vomiting currently.  No fever or chills.  Patient states that she has generalized weakness, but no unilateral numbness or tingling in extremities, No facial droop or slurred speech.  Patient states that she fell several times at home, at least one time she injured her head.  Patient states that she still has some cough with little brownish colored mucus production.  Denies shortness of breath, chest pain, runny nose or sore throat.  Patient states that she did not noticed dark stool or blood in stool, but his FOBT is positive in ED today.  Patient denies symptoms of UTI.  The patient underwent an EGD on 01/13/15 by Dr. Henrene Pastor that was normal. She also underwent a colonoscopy by Dr. Henrene Pastor on 09/13/2011 that demonstrated moderate diverticulosis.  Consultants   General surgery  Procedures   None  Antibiotics   Anti-infectives (From admission, onward)   Start     Dose/Rate Route Frequency Ordered Stop   04/08/19 0300  piperacillin-tazobactam  (ZOSYN) IVPB 2.25 g     2.25 g 100 mL/hr over 30 Minutes Intravenous Every 8 hours 04/07/19 2205     04/07/19 1730  piperacillin-tazobactam (ZOSYN) IVPB 3.375 g     3.375 g 100 mL/hr over 30 Minutes Intravenous  Once 04/07/19 1723 04/07/19 1801      Subjective  The patient is resting quietly. She continues to complain of abdominal pain.  Objective   Vitals:  Vitals:   04/09/19 1001 04/09/19 1259  BP: (!) 125/55 (!) 122/48  Pulse:  80  Resp:    Temp:  98.2 F (36.8 C)  SpO2:  96%    Exam:  Constitutional:   The patient is awake, alert, and oriented x 3.  No acute distress. Respiratory:   No increased work of breathing.  No wheezes, rales, or rhonchi.  No tactile fremitus. Cardiovascular:   Regular rate and rhythm.  No murmurs, ectopy, or gallups.  No lateral PMI. No thrills.  Abdomen:   Abdomen is soft, non-distended. Positive for diffuse tenderness worst on the right side.  No hernias, masses, or organomegaly.  Hypoactive bowel sounds. Musculoskeletal:   No cyanosis, clubbing, or edema. Skin:   No rashes, lesions, ulcers  palpation of skin: no induration or nodules Neurologic:   CN 2-12 intact  Sensation all 4 extremities intact Psychiatric:   Mental status o Mood, affect appropriate o Orientation to person, place, time   judgment and insight appear intact   I have personally reviewed the following:   Today's Data   CBC, BMP, Vitals  Micro Data   Blood cultures  Scheduled Meds:  allopurinol  100 mg Oral Daily   amLODipine  10 mg Oral Daily   atorvastatin  20 mg Oral Daily   calcium-vitamin D  1 tablet Oral Daily   gabapentin  200 mg Oral QHS   metoprolol tartrate  25 mg Oral BID   ondansetron  4 mg Oral TID WC   pantoprazole  40 mg Oral BID   vitamin B-12  1,000 mcg Oral Daily   vitamin C  500 mg Oral Daily   Continuous Infusions:  piperacillin-tazobactam (ZOSYN)  IV Stopped (04/09/19 1113)    Principal  Problem:   Enteritis Active Problems:   Diabetes mellitus type 2 with complications (HCC)   Hyperlipidemia   GOUT   Normocytic anemia   Essential hypertension   GERD   Depression   Generalized weakness   Hypomagnesemia   Acute respiratory failure with hypoxia (HCC)   Acute renal failure superimposed on stage 4 chronic kidney disease (HCC)   Chronic diastolic CHF (congestive heart failure) (HCC)   Hypokalemia   Hypocalcemia   Fall   Occult blood positive stool   LOS: 2 days    A & P  Enteritis: Patient has been having intermittent diarrhea and abdominal pain.  CT scan showed possible enteritis, cannot rule out small bowel microperforation.  Patient does not have fever or leukocytosis.  Clinically does not meet criteria for sepsis.  Currently hemodynamically stable.  General surgeon, Dr. Kieth Brightly was consulted. She was admitted to a med/surg bed as inpatient. Blood cultures have been drawn. She is receiving IV Zosyn. GI pathogen panel is pending. I appreciate General Surgery's help. Plan for now is for bowel rest and antibiotics.   Diabetes mellitus type 2 with complications: Diet controlled. ([place]): Last A1c 6.3 on 06/17/18, well controled.  Blood sugar 131. Clear liquids. Will follow FSBS with SSI.   Hyperlipidemia: lipitor  Gout: Allopurinol  Normocytic anemia and positive FOBT: hemoglobin dropped from 10-7.3.  No gross rectal bleeding. Continue PPI bid. IV fluids. Monitor hemoglobin and transfuse for hemoglobin less than 7.0. Avoid NSAIDS. The patient has been typed and screened.   HTN: Continue home medications: Amlodipine, metoprolol. Hold for low blood pressure. IV hydralazine prn.  GERD: Protonix bid.  Depression: Hold Remeron due to QTC prolongation.  Acute respiratory failure with hypoxia ([place]): Most likely due to incomplete recovery from recently treated community-acquired pneumonia.  Patient had negative COVID-19 test in that admission.  Still has some cough.   Chest x-ray did not show obvious infiltration. Symptomatic treatment for cough, wheezes with as needed albuterol and mucinex. Repeat COVID-19 is negative.   Acute renal failure superimposed on stage 4 chronic kidney disease: Baseline Cre is 1.7-2.4. The patient's creatinine is 3.40 this morning. This is likely due to prerenal secondary to dehydration and continuation of diuretics. Lasix has been held. Monitor creatinine, electrolytes. Hold lasix, avoid nephrotoxic substances and hypotension.  Chronic diastolic CHF (congestive heart failure) ([place]): 2D echo on 06/02/2017 showed EF of 55% with grade 1 diastolic dysfunction.  Patient does not have shortness of breath, no DVT.  No pulmonary edema on chest x-ray.  CHF seem to be compensated. laisx is being held due to worsening renal function. Continue metoprolol with parameters to hold for hypotension and low heart rate.  Hypokalemia, Hypocalcemia and hypomagnesemia: Supplement and monitor.  Fall and generalized weakness: Patient fell 4 times, most likely due to generalized weakness.  Her generalized weakness is likely multifactorial etiology given multiple comorbidities and acute issues as listed above.  CT head  is negative for acute intracranial abnormalities.  No focal neurologic findings on physical examination.  Low suspicions for stroke. PT/OT. [place] management and social work will be consulted for likely discharge to SNF.  I have seen and examined this patient myself. I have spent 30 minutes in her evaluation and care.  DVT prophylaxis: SCD's Code Status: DNR Family Communication: None available Disposition Plan: tbd   Patricia Fargo, DO [facility] Direct contact: see [email or web address]  7PM-7AM contact night coverage as above 04/09/2019, 6:02 PM  LOS: 1 day

## 2019-04-09 NOTE — Progress Notes (Signed)
Occupational Therapy Evaluation Patient Details Name: Samantha Mckenzie MRN: [id number] DOB: 07/29/1936 Today's Date: 04/09/2019    History of Present Illness Pt is an 83 y.o. female admitted 04/07/19 with abdominal pain, generalized weakness and falls. Worked up for focal small bowel entiritis; plan for bowel rest and anitbiotics. Head CT negative for acute abnormality. PMH includes CKD 4, HTN, gout, breast CA, macular degeneration. Of note, recent admission 03/19/19-03/27/19 with CAP.   Clinical Impression   Pt admitted with above diagnosis. PTA pt PLOF requiring mod I in ADLs with increased time and use of AE. Pt reports living with husband and not having a shower seat for bathroom set up. Pt currently requires Mod I for LB ADLs, Min guard for functional mobility and transfer due to weakness and safety. Pt will benefit from continued acute OT to address deficits and to maximize independence prior to d/c to [facility]. OT will continue to follow acutely.  O2 stats listed below.     Follow Up Recommendations  Home health OT;Supervision - Intermittent    Equipment Recommendations  3 in 1 bedside commode    Recommendations for Other Services       Precautions / Restrictions Precautions Precautions: Fall Restrictions Weight Bearing Restrictions: No      Mobility Bed Mobility Overal bed mobility: Modified Independent Bed Mobility: Supine to Sit     Supine to sit: Modified independent (Device/Increase time)     General bed mobility comments: reliant of 1 hand held assist to power trunk up to sitting.   Transfers Overall transfer level: Needs assistance Equipment used: Rolling walker (2 wheeled) Transfers: Sit to/from Stand Sit to Stand: Min guard         General transfer comment: Able to stand from bed and toilet to RW; min guard for safety    Balance Overall balance assessment: Needs assistance Sitting-balance support: No upper extremity supported;Feet supported Sitting  balance-Leahy Scale: Good     Standing balance support: During functional activity;No upper extremity supported Standing balance-Leahy Scale: Fair Standing balance comment: Can static stand without UE support; dynamic stability improved with at least single UE support                           ADL either performed or assessed with clinical judgement   ADL Overall ADL's : Needs assistance/impaired Eating/Feeding: Set up;Sitting   Grooming: Set up;Sitting   Upper Body Bathing: Independent;Sitting   Lower Body Bathing: Modified independent;Sitting/lateral leans   Upper Body Dressing : Independent;Sitting   Lower Body Dressing: Modified independent;Sit to/from stand Lower Body Dressing Details (indicate cue type and reason): increased time required Toilet Transfer: Min guard Toilet Transfer Details (indicate cue type and reason): pt transfered from bed to [facility] for toileted min gaurd required for safety with RW.  Toileting- Clothing Manipulation and Hygiene: Modified independent;Sitting/lateral lean       Functional mobility during ADLs: Min guard;Rolling walker General ADL Comments: Pt requires increased time to engage in ADLs while seated for safety. Pt on 2L O2 supine: 93, sitting EOB: [birth year], after transfer to chair with RA 89. Cannula replaced.     Vision         Perception     Praxis      Pertinent Vitals/Pain Pain Assessment: No/denies pain     Hand Dominance     Extremity/Trunk Assessment Upper Extremity Assessment Upper Extremity Assessment: Generalized weakness   Lower Extremity Assessment Lower Extremity Assessment: Defer to PT  evaluation   Cervical / Trunk Assessment Cervical / Trunk Assessment: Normal   Communication Communication Communication: HOH   Cognition Arousal/Alertness: Awake/alert Behavior During Therapy: WFL for tasks assessed/performed Overall Cognitive Status: Within Functional Limits for tasks assessed                                      General Comments       Exercises     Shoulder Instructions      Home Living Family/patient expects to be discharged to:: Private residence Living Arrangements: Spouse/significant other Available Help at Discharge: Family;Available PRN/intermittently Type of Home: House Home Access: Stairs to enter [employer] of Steps: 2-3   Home Layout: One level     Bathroom Shower/Tub: Occupational psychologist: Standard     Home Equipment: Environmental consultant - 2 wheels;Walker - 4 wheels   Additional Comments: Pt reports standing when taking a shower. Lives with husband      Prior Functioning/Environment Level of Independence: Independent with assistive device(s)        Comments: Ambulatory with RW. Reports being independent PTA        OT Problem List: Decreased activity tolerance;Decreased safety awareness;Decreased knowledge of use of DME or AE      OT Treatment/Interventions: Self-care/ADL training;Therapeutic exercise;Manual therapy;Patient/family education;Balance training;Therapeutic activities    OT Goals(Current goals can be found in the care plan section) Acute Rehab OT Goals Patient Stated Goal: To get some rest OT Goal Formulation: With patient Time For Goal Achievement: 04/23/19 Potential to Achieve Goals: Good  OT Frequency: Min 2X/week   Barriers to D/C:            Co-evaluation              AM-PAC OT "6 Clicks" Daily Activity     Outcome Measure Help from another person eating meals?: None Help from another person taking care of personal grooming?: None Help from another person toileting, which includes using toliet, bedpan, or urinal?: None Help from another person bathing (including washing, rinsing, drying)?: A Little Help from another person to put on and taking off regular upper body clothing?: None Help from another person to put on and taking off regular lower body clothing?: None 6 Click Score: 23    End of Session Equipment Utilized During Treatment: Gait belt;Rolling walker;Oxygen Nurse Communication: Mobility status  Activity Tolerance: Patient tolerated treatment well Patient left: in chair;with call bell/phone within reach;with chair alarm set;with nursing/sitter in room  OT Visit Diagnosis: Unsteadiness on feet (R26.81);Muscle weakness (generalized) (M62.81)                Time: 8315-1761 OT Time Calculation (min): 16 min Charges:  OT General Charges $OT Visit: 1 Visit OT Evaluation $OT Eval Low Complexity: [place], MSOT, OTR/L  Supplemental Rehabilitation Services  [phone]   Marius Ditch 04/09/2019, 12:17 PM

## 2019-04-10 LAB — COMPREHENSIVE METABOLIC PANEL
ALT: 21 U/L (ref 0–44)
AST: 25 U/L (ref 15–41)
Albumin: 1.8 g/dL — ABNORMAL LOW (ref 3.5–5.0)
Alkaline Phosphatase: 57 U/L (ref 38–126)
Anion gap: 11 (ref 5–15)
BUN: 35 mg/dL — ABNORMAL HIGH (ref 8–23)
CO2: 28 mmol/L (ref 22–32)
Calcium: 8.3 mg/dL — ABNORMAL LOW (ref 8.9–10.3)
Chloride: 98 mmol/L (ref 98–111)
Creatinine, Ser: 2.86 mg/dL — ABNORMAL HIGH (ref 0.44–1.00)
GFR calc Af Amer: 17 mL/min — ABNORMAL LOW (ref >60)
GFR calc non Af Amer: 15 mL/min — ABNORMAL LOW (ref >60)
Glucose, Bld: 94 mg/dL (ref 70–99)
Potassium: 3.9 mmol/L (ref 3.5–5.1)
Sodium: 137 mmol/L (ref 135–145)
Total Bilirubin: 0.9 mg/dL (ref 0.3–1.2)
Total Protein: 6 g/dL — ABNORMAL LOW (ref 6.5–8.1)

## 2019-04-10 LAB — CBC WITH DIFFERENTIAL/PLATELET
Abs Immature Granulocytes: 0.02 10*3/uL (ref 0.00–0.07)
Basophils Absolute: 0 10*3/uL (ref 0.0–0.1)
Basophils Relative: 1 %
Eosinophils Absolute: 0.2 10*3/uL (ref 0.0–0.5)
Eosinophils Relative: 5 %
HCT: 28.6 % — ABNORMAL LOW (ref 36.0–46.0)
Hemoglobin: 9.6 g/dL — ABNORMAL LOW (ref 12.0–15.0)
Immature Granulocytes: 1 %
Lymphocytes Relative: 27 %
Lymphs Abs: 1.1 10*3/uL (ref 0.7–4.0)
MCH: 28.7 pg (ref 26.0–34.0)
MCHC: 33.6 g/dL (ref 30.0–36.0)
MCV: 85.4 fL (ref 80.0–100.0)
Monocytes Absolute: 0.4 10*3/uL (ref 0.1–1.0)
Monocytes Relative: 10 %
Neutro Abs: 2.3 10*3/uL (ref 1.7–7.7)
Neutrophils Relative %: 56 %
Platelets: 417 10*3/uL — ABNORMAL HIGH (ref 150–400)
RBC: 3.35 MIL/uL — ABNORMAL LOW (ref 3.87–5.11)
RDW: 16.4 % — ABNORMAL HIGH (ref 11.5–15.5)
WBC: 4 10*3/uL (ref 4.0–10.5)
nRBC: 0 % (ref 0.0–0.2)

## 2019-04-10 LAB — GLUCOSE, CAPILLARY: Glucose-Capillary: 88 mg/dL (ref 70–99)

## 2019-04-10 NOTE — Progress Notes (Signed)
Physical Therapy Treatment Patient Details Name: Samantha Mckenzie MRN: [id number] DOB: 30-Oct-1935 Today's Date: 04/10/2019    History of Present Illness Pt is an 83 y.o. female admitted 04/07/19 with abdominal pain, generalized weakness and falls. Worked up for focal small bowel entiritis; plan for bowel rest and anitbiotics. Head CT negative for acute abnormality. PMH includes CKD 4, HTN, gout, breast CA, macular degeneration. Of note, recent admission 03/19/19-03/27/19 with CAP.    PT Comments    Patient received up in room - stating she did not fell well overall. Patient ambulating short distance with RW - close min guard for safety with cueing for safety with RW and obstacle navigation. Patient declining further mobility than short distance in room. Patient supine with all needs met at end of session.     Follow Up Recommendations  Home health PT;Supervision - Intermittent     Equipment Recommendations  None recommended by PT    Recommendations for Other Services       Precautions / Restrictions Precautions Precautions: Fall Restrictions Weight Bearing Restrictions: No    Mobility  Bed Mobility Overal bed mobility: Needs Assistance Bed Mobility: Sit to Supine       Sit to supine: Supervision   General bed mobility comments: for safety - increased time to reposition in bed  Transfers Overall transfer level: Needs assistance Equipment used: Rolling walker (2 wheeled) Transfers: Sit to/from Stand Sit to Stand: Min guard         General transfer comment: Min guard from toilet for safety and immediate standing balance  Ambulation/Gait Ambulation/Gait assistance: Supervision Gait Distance (Feet): 15 Feet Assistive device: Rolling walker (2 wheeled) Gait Pattern/deviations: Step-through pattern;Decreased stride length;Trunk flexed Gait velocity: Decreased   General Gait Details: short distance in room - mildly unsteady   Stairs             Wheelchair  Mobility    Modified Rankin (Stroke Patients Only)       Balance Overall balance assessment: Needs assistance Sitting-balance support: No upper extremity supported;Feet supported Sitting balance-Leahy Scale: Good     Standing balance support: During functional activity;Bilateral upper extremity supported Standing balance-Leahy Scale: Fair Standing balance comment: use of RW for dynamic mobility                            Cognition Arousal/Alertness: Awake/alert Behavior During Therapy: WFL for tasks assessed/performed Overall Cognitive Status: Within Functional Limits for tasks assessed                                        Exercises      General Comments General comments (skin integrity, edema, etc.): patient stating she did not feel well - states "just take me out and shoot me"  - nursing immediately notified      Pertinent Vitals/Pain Pain Assessment: Faces Faces Pain Scale: Hurts little more Pain Location: generalized Pain Descriptors / Indicators: Discomfort;Moaning Pain Intervention(s): Limited activity within patient's tolerance;Monitored during session;Repositioned    Home Living                      Prior Function            PT Goals (current goals can now be found in the care plan section) Acute Rehab PT Goals Patient Stated Goal: To get some rest PT Goal Formulation:  With patient Time For Goal Achievement: 04/22/19 Potential to Achieve Goals: Good Progress towards PT goals: Progressing toward goals    Frequency    Min 3X/week      PT Plan Current plan remains appropriate    Co-evaluation              AM-PAC PT "6 Clicks" Mobility   Outcome Measure  Help needed turning from your back to your side while in a flat bed without using bedrails?: None Help needed moving from lying on your back to sitting on the side of a flat bed without using bedrails?: None Help needed moving to and from a bed to a  chair (including a wheelchair)?: A Little Help needed standing up from a chair using your arms (e.g., wheelchair or bedside chair)?: A Little Help needed to walk in hospital room?: A Little Help needed climbing 3-5 steps with a railing? : A Little 6 Click Score: 20    End of Session Equipment Utilized During Treatment: Gait belt Activity Tolerance: Patient tolerated treatment well Patient left: in bed;with call bell/phone within reach Nurse Communication: Mobility status PT Visit Diagnosis: Muscle weakness (generalized) (M62.81);Unsteadiness on feet (R26.81)     Time: 2174-7159 PT Time Calculation (min) (ACUTE ONLY): 13 min  Charges:  $Gait Training: 8-22 mins                      Lanney Gins, PT, DPT Supplemental Physical Therapist 04/10/19 2:06 PM Pager: [phone] Office: [phone]

## 2019-04-10 NOTE — Progress Notes (Signed)
Physical therapy notified this RN of statements made by patient regarding not feeling well and "take me out and shoot me". Paged MD and notified Dr. Benny Lennert. Will continue to monitor closely for remainder of shift and report to oncoming nursing staff.

## 2019-04-10 NOTE — Progress Notes (Signed)
PROGRESS NOTE  [name]:[id number] DOB: September 04, 1936 DOA: 04/07/2019 PCP: Colon Branch, MD  Brief History   Samantha Mckenzie is a 83 y.o. female with medical history significant of hypertension, hyperlipidemia, diet-controlled diabetes, GERD, gout, depression, breast cancer (surgery radiation and chemotherapy), CKD stage IV, dCHF, who presents with intermittent diarrhea, abdominal pain, generalized weakness and fall.  Patient was recently hospitalized from 6/8-6/16 due to community-acquired pneumonia.  Patient had negative COVID-19 testing in that admission.  Patient states that she has been having intermittent diarrhea and abdominal pain for more than 2 weeks.  She states that she has been having 2-3 watery diarrhea each day, but no diarrhea today.  She also has intermittent abdominal pain, which is located in the right lower quadrant. She states that her AP could be very severe, sharp, nonradiating, but currently it has subsided.  Patient does not have vomiting currently.  No fever or chills.  Patient states that she has generalized weakness, but no unilateral numbness or tingling in extremities, No facial droop or slurred speech.  Patient states that she fell several times at home, at least one time she injured her head.  Patient states that she still has some cough with little brownish colored mucus production.  Denies shortness of breath, chest pain, runny nose or sore throat.  Patient states that she did not noticed dark stool or blood in stool, but his FOBT is positive in ED today.  Patient denies symptoms of UTI.  The patient underwent an EGD on 01/13/15 by Dr. Henrene Pastor that was normal. She also underwent a colonoscopy by Dr. Henrene Pastor on 09/13/2011 that demonstrated moderate diverticulosis.  Consultants   General surgery  Procedures   None  Antibiotics   Anti-infectives (From admission, onward)   Start     Dose/Rate Route Frequency Ordered Stop   04/08/19 0300  piperacillin-tazobactam  (ZOSYN) IVPB 2.25 g     2.25 g 100 mL/hr over 30 Minutes Intravenous Every 8 hours 04/07/19 2205     04/07/19 1730  piperacillin-tazobactam (ZOSYN) IVPB 3.375 g     3.375 g 100 mL/hr over 30 Minutes Intravenous  Once 04/07/19 1723 04/07/19 1801      Subjective  The patient is resting quietly. She states that she is feeling a little better. No new complaints.  Objective   Vitals:  Vitals:   04/10/19 1010 04/10/19 1310  BP: (!) 137/59 (!) 116/50  Pulse: 89 69  Resp:    Temp:  98.6 F (37 C)  SpO2:  95%    Exam:  Constitutional:   The patient is somnolent, but arousable. Respiratory:   No increased work of breathing.  No wheezes, rales, or rhonchi.  No tactile fremitus. Cardiovascular:   Regular rate and rhythm.  No murmurs, ectopy, or gallups.  No lateral PMI. No thrills.  Abdomen:   Abdomen is soft, non-distended. Less tender this morning.  No hernias, masses, or organomegaly.  Hypoactive bowel sounds. Musculoskeletal:   No cyanosis, clubbing, or edema. Skin:   No rashes, lesions, ulcers  palpation of skin: no induration or nodules Neurologic:   CN 2-12 intact  Sensation all 4 extremities intact Psychiatric:   Mental status o Mood, affect appropriate o Orientation to person, place, time   judgment and insight appear intact   I have personally reviewed the following:   Today's Data   CBC, BMP, Vitals  Micro Data   Blood cultures  Scheduled Meds:  allopurinol  100 mg Oral Daily  amLODipine  10 mg Oral Daily   atorvastatin  20 mg Oral Daily   calcium-vitamin D  1 tablet Oral Daily   gabapentin  200 mg Oral QHS   metoprolol tartrate  25 mg Oral BID   ondansetron  4 mg Oral TID WC   pantoprazole  40 mg Oral BID   vitamin B-12  1,000 mcg Oral Daily   vitamin C  500 mg Oral Daily   Continuous Infusions:  piperacillin-tazobactam (ZOSYN)  IV 2.25 g (04/10/19 1135)    Principal Problem:   Enteritis Active Problems:    Diabetes mellitus type 2 with complications (HCC)   Hyperlipidemia   GOUT   Normocytic anemia   Essential hypertension   GERD   Depression   Generalized weakness   Hypomagnesemia   Acute respiratory failure with hypoxia (HCC)   Acute renal failure superimposed on stage 4 chronic kidney disease (HCC)   Chronic diastolic CHF (congestive heart failure) (HCC)   Hypokalemia   Hypocalcemia   Fall   Occult blood positive stool   LOS: 3 days    A & P  Enteritis: Patient has been having intermittent diarrhea and abdominal pain.  CT scan showed possible enteritis, cannot rule out small bowel microperforation.  Patient does not have fever or leukocytosis.  Clinically does not meet criteria for sepsis.  Currently hemodynamically stable.  General surgeon, Dr. Kieth Brightly was consulted. She was admitted to a med/surg bed as inpatient. Blood cultures have been drawn. She is receiving IV Zosyn. GI pathogen panel is pending. I appreciate General Surgery's help. Diet has been advanced to full liquids.  Diabetes mellitus type 2 with complications: Diet controlled. ([place]): Last A1c 6.3 on 06/17/18, well controled.  Blood sugar 131. Clear liquids. Will follow FSBS with SSI.   Hyperlipidemia: lipitor  Gout: Allopurinol  Normocytic anemia and positive FOBT: hemoglobin dropped from 10-7.3.  No gross rectal bleeding. Continue PPI bid. IV fluids. Monitor hemoglobin and transfuse for hemoglobin less than 7.0. Avoid NSAIDS. The patient has been typed and screened.   HTN: Continue home medications: Amlodipine, metoprolol. Hold for low blood pressure. IV hydralazine prn.  GERD: Protonix bid.  Depression: Hold Remeron due to QTC prolongation.  Acute respiratory failure with hypoxia ([place]): Most likely due to incomplete recovery from recently treated community-acquired pneumonia.  Patient had negative COVID-19 test in that admission.  Still has some cough.  Chest x-ray did not show obvious infiltration.  Symptomatic treatment for cough, wheezes with as needed albuterol and mucinex. Repeat COVID-19 is negative.   Acute renal failure superimposed on stage 4 chronic kidney disease: Baseline Cre is 1.7-2.4. The patient's creatinine is 2.86 this morning. This is likely due to prerenal secondary to dehydration and continuation of diuretics. Lasix has been held. Monitor creatinine, electrolytes. Hold lasix, avoid nephrotoxic substances and hypotension.  Chronic diastolic CHF (congestive heart failure) ([place]): 2D echo on 06/02/2017 showed EF of 55% with grade 1 diastolic dysfunction.  Patient does not have shortness of breath, no DVT.  No pulmonary edema on chest x-ray.  CHF seem to be compensated. laisx is being held due to worsening renal function. Continue metoprolol with parameters to hold for hypotension and low heart rate.  Hypokalemia, Hypocalcemia and hypomagnesemia: Supplement and monitor.  Fall and generalized weakness: Patient fell 4 times, most likely due to generalized weakness.  Her generalized weakness is likely multifactorial etiology given multiple comorbidities and acute issues as listed above.  CT head is negative for acute intracranial abnormalities.  No  focal neurologic findings on physical examination.  Low suspicions for stroke. PT/OT is seeing the patient and related today that the patient told them several times that they wanted them to take her out back and shoot her. I will order a palliative care consult.  Case management and social work will be consulted for likely discharge to SNF.  I have seen and examined this patient myself. I have spent 30 minutes in her evaluation and care.  DVT prophylaxis: SCD's Code Status: DNR Family Communication: None available Disposition Plan: tbd   Quantavius Humm, DO [facility] Direct contact: see [email or web address]  7PM-7AM contact night coverage as above 04/10/2019, 4:47 PM  LOS: 1 day

## 2019-04-10 NOTE — Progress Notes (Signed)
[facility]/Trauma Progress Note      Assessment/Plan Anemia.  History of breast cancer CKD 4 followed by nephrology GERD Gout Hypertension  Focal small bowel enteritis right lower quadrant.  - Etiologies to consider are infectious, inflammatory, foreign body perforation - doing better clinically, will give FLD - continue abx -Reserve operative intervention for failure to resolve or clinical progression  FEN: FLD VTE: SCD's, dvt proph per trh ID: Zosyn 06/27>> Follow up: TBD  DISPO: allow FLD to see if she tolerates and if so advance to soft.     LOS: 3 days    Subjective: CC: enteritis  Pt denies pain, nausea, vomiting, fever or chills. No BM but having flatus. She wants to go home. She does not feel like eating.   Objective: Vital signs in last 24 hours: Temp:  [98.2 F (36.8 C)-98.5 F (36.9 C)] 98.2 F (36.8 C) (06/30 0445) Pulse Rate:  [80-84] 84 (06/30 0445) Resp:  [17-18] 18 (06/30 0445) BP: (122-135)/(48-57) 126/57 (06/30 0445) SpO2:  [96 %] 96 % (06/30 0445) Last BM Date: 04/07/19  Intake/Output from previous day: 06/29 0701 - 06/30 0700 In: 270.1 [IV Piggyback:270.1] Out: -  Intake/Output this shift: No intake/output data recorded.  PE: Gen:  Alert, NAD, pleasant, cooperative Pulm:  Rate and effort normal Abd: Soft, NT/ND, +BS Skin: no rashes noted, warm and dry Psychiatric: She has anormal mood and affect. Herbehavior is normal  Anti-infectives: Anti-infectives (From admission, onward)   Start     Dose/Rate Route Frequency Ordered Stop   04/08/19 0300  piperacillin-tazobactam (ZOSYN) IVPB 2.25 g     2.25 g 100 mL/hr over 30 Minutes Intravenous Every 8 hours 04/07/19 2205     04/07/19 1730  piperacillin-tazobactam (ZOSYN) IVPB 3.375 g     3.375 g 100 mL/hr over 30 Minutes Intravenous  Once 04/07/19 1723 04/07/19 1801      Lab Results:  Recent Labs    04/09/19 0234 04/10/19 0257  WBC 4.0 4.0  HGB 8.4* 9.6*  HCT  25.0* 28.6*  PLT 366 417*   BMET Recent Labs    04/09/19 0234 04/10/19 0257  NA 137 137  K 3.3* 3.9  CL 96* 98  CO2 28 28  GLUCOSE 84 94  BUN 43* 35*  CREATININE 3.24* 2.86*  CALCIUM 7.9* 8.3*   PT/INR Recent Labs    04/07/19 2157  LABPROT 14.1  INR 1.1   CMP     Component Value Date/Time   NA 137 04/10/2019 0257   NA 139 01/20/2015   K 3.9 04/10/2019 0257   CL 98 04/10/2019 0257   CO2 28 04/10/2019 0257   GLUCOSE 94 04/10/2019 0257   BUN 35 (H) 04/10/2019 0257   BUN 39 (A) 01/20/2015   CREATININE 2.86 (H) 04/10/2019 0257   CALCIUM 8.3 (L) 04/10/2019 0257   PROT 6.0 (L) 04/10/2019 0257   ALBUMIN 1.8 (L) 04/10/2019 0257   AST 25 04/10/2019 0257   ALT 21 04/10/2019 0257   ALKPHOS 57 04/10/2019 0257   BILITOT 0.9 04/10/2019 0257   GFRNONAA 15 (L) 04/10/2019 0257   GFRAA 17 (L) 04/10/2019 0257   Lipase     Component Value Date/Time   LIPASE 38 04/07/2019 1616    Studies/Results: No results found.    Kalman Drape , [employer] [facility] 04/10/2019, 8:19 AM  Pager: [phone] Mon-Wed, Friday 7:00am-4:30pm Thurs 7am-11:30am  Consults: [phone]

## 2019-04-10 NOTE — TOC Initial Note (Signed)
Transition of Care [facility]) - Initial/Assessment Note    Patient Details  Name: Samantha Mckenzie MRN: [id number] Date of Birth: Oct 08, 1936  Transition of Care [facility]) CM/SW Contact:    Marilu Favre, RN Phone Number: 04/10/2019, 3:57 PM  Clinical Narrative:                 Confirmed face sheet information with patient , patient from home with husband, already has DME. Patient active PTA with [facility] for [place]. Asked MD for resumption of care orders   Expected Discharge Plan: [place] Barriers to Discharge: Continued Medical Work up   Patient Goals and CMS Choice Patient states their goals for this hospitalization and ongoing recovery are:: to return to home CMS Medicare.gov Compare Post Acute Care list provided to:: Patient Choice offered to / list presented to : Patient  Expected Discharge Plan and Services Expected Discharge Plan: [place]   Discharge Planning Services: (P) CM Consult Post Acute Care Choice: [place] arrangements for the past 2 months: Single Family Home Expected Discharge Date: 04/13/19               DME Arranged: N/A         HH Arranged: PT, OT, Nurse's Aide HH Agency: [facility]) Date [place]: 04/10/19 Time [place]: [id number] Representative spoke with at [place]: Butch Penny, [facility] has been following since admission, need orders  Prior Living Arrangements/Services Living arrangements for the past 2 months: [place] Lives with:: Spouse Patient language and need for interpreter reviewed:: Yes Do you feel safe going back to the place where you live?: Yes      Need for Family Participation in Patient Care: Yes (Comment) Care giver support system in place?: Yes (comment) Current home services: Home PT, Home OT, Homehealth aide Criminal Activity/Legal Involvement Pertinent to Current Situation/Hospitalization: No - Comment as  needed  Activities of Daily Living Home Assistive Devices/Equipment: None ADL Screening (condition at time of admission) Patient's cognitive ability adequate to safely complete daily activities?: Yes Is the patient deaf or have difficulty hearing?: No Does the patient have difficulty seeing, even when wearing glasses/contacts?: No Does the patient have difficulty concentrating, remembering, or making decisions?: No Patient able to express need for assistance with ADLs?: Yes Does the patient have difficulty dressing or bathing?: No Independently performs ADLs?: Yes (appropriate for developmental age) Does the patient have difficulty walking or climbing stairs?: No Weakness of Legs: None Weakness of Arms/Hands: None  Permission Sought/Granted   Permission granted to share information with : Yes, Verbal Permission Granted     Permission granted to share info w AGENCY: [facility]        Emotional Assessment Appearance:: Appears stated age Attitude/Demeanor/Rapport: Engaged Affect (typically observed): Accepting Orientation: : Oriented to Self, Oriented to Place, Oriented to  Time, Oriented to Situation Alcohol / Substance Use: Not Applicable Psych Involvement: No (comment)  Admission diagnosis:  Hypokalemia [E87.6] Enteritis [K52.9] Weakness [R53.1] Iron deficiency anemia, unspecified iron deficiency anemia type [D50.9] Patient Active Problem List   Diagnosis Date Noted  . Enteritis 04/07/2019  . Chronic diastolic CHF (congestive heart failure) ([place]) 04/07/2019  . Hypokalemia 04/07/2019  . Hypocalcemia 04/07/2019  . Fall 04/07/2019  . Occult blood positive stool 04/07/2019  . Adjustment disorder with depressed mood   . Generalized abdominal pain   . Dysphasia 03/21/2019  . CAP (community acquired pneumonia) 03/20/2019  .  Nausea vomiting and diarrhea 03/20/2019  . Acute respiratory failure with hypoxia ([place]) 03/20/2019  . Acute renal failure superimposed on  stage 4 chronic kidney disease ([place]) 03/20/2019  . Lactic acidosis 03/20/2019  . Diabetic peripheral neuropathy associated with type 2 diabetes mellitus ([place]) 03/20/2019  . Hypomagnesemia 06/21/2018  . Generalized weakness 06/20/2018  . PCP NOTES >>> 07/11/2015  . Depression   . VITAMIN B12 DEFICIENCY 04/01/2008  . GERD 12/19/2007  . NEOP, MALIGNANT, FEMALE BREAST NOS 07/07/2007  . Osteoarthritis 07/07/2007  . Diabetes mellitus type 2 with complications ([place]) 17/49/4496  . Hyperlipidemia 02/28/2007  . GOUT 02/28/2007  . Normocytic anemia 02/28/2007  . Essential hypertension 02/28/2007   PCP:  Colon Branch, MD Pharmacy:   [facility], [place] [address] [place] [address] Phone: [phone] Fax: [facility] [address], [place]. [place]. [place] [address] Phone: [phone] Fax: [phone]     Social Determinants of Health (SDOH) Interventions    Readmission Risk Interventions Readmission Risk Prevention Plan 03/26/2019  Transportation Screening Complete  PCP or Specialist Appt within 3-5 Days Complete  HRI or [place] Complete  Social Work Consult for [place] Planning/Counseling Complete  Palliative Care Screening Not Applicable  Medication Review Press photographer) Complete  Some recent data might be hidden

## 2019-04-10 NOTE — Progress Notes (Signed)
Pharmacy Antibiotic Note  Samantha Mckenzie is a 83 y.o. female admitted on 04/07/2019 with enteritis. Pharmacy has been consulted for zosyn dosing.   Plan: Continue Zosyn 2.25g IV q8h Monitor renal function, cultures, clinical course  Height: 5' 3"  (160 cm) Weight: 130 lb (59 kg) IBW/kg (Calculated) : 52.4  Temp (24hrs), Avg:98.3 F (36.8 C), Min:98.2 F (36.8 C), Max:98.5 F (36.9 C)  Recent Labs  Lab 04/07/19 1616 04/08/19 0225 04/08/19 0824 04/08/19 2037 04/09/19 0234 04/10/19 0257  WBC 4.7 4.1 4.2 3.4* 4.0 4.0  CREATININE 3.54*  --  3.40*  --  3.24* 2.86*    Estimated Creatinine Clearance: 13 mL/min (A) (by C-G formula based on SCr of 2.86 mg/dL (H)).    Allergies  Allergen Reactions  . Codeine Nausea And Vomiting  . Morphine Nausea And Vomiting  . Oxycodone-Acetaminophen Nausea And Vomiting  . Sulfonamide Derivatives Nausea And Vomiting  . Tramadol Hcl Nausea And Vomiting    Thank you for allowing pharmacy to be a part of this patient's care.   Lindell Spar, PharmD, BCPS Clinical Pharmacist  04/10/2019 10:29 AM

## 2019-04-11 LAB — COMPREHENSIVE METABOLIC PANEL
ALT: 19 U/L (ref 0–44)
AST: 24 U/L (ref 15–41)
Albumin: 2 g/dL — ABNORMAL LOW (ref 3.5–5.0)
Alkaline Phosphatase: 60 U/L (ref 38–126)
Anion gap: 13 (ref 5–15)
BUN: 37 mg/dL — ABNORMAL HIGH (ref 8–23)
CO2: 28 mmol/L (ref 22–32)
Calcium: 8.2 mg/dL — ABNORMAL LOW (ref 8.9–10.3)
Chloride: 98 mmol/L (ref 98–111)
Creatinine, Ser: 2.88 mg/dL — ABNORMAL HIGH (ref 0.44–1.00)
GFR calc Af Amer: 17 mL/min — ABNORMAL LOW (ref >60)
GFR calc non Af Amer: 15 mL/min — ABNORMAL LOW (ref >60)
Glucose, Bld: 155 mg/dL — ABNORMAL HIGH (ref 70–99)
Potassium: 3.7 mmol/L (ref 3.5–5.1)
Sodium: 139 mmol/L (ref 135–145)
Total Bilirubin: 1 mg/dL (ref 0.3–1.2)
Total Protein: 6.3 g/dL — ABNORMAL LOW (ref 6.5–8.1)

## 2019-04-11 LAB — GLUCOSE, CAPILLARY: Glucose-Capillary: 117 mg/dL — ABNORMAL HIGH (ref 70–99)

## 2019-04-11 NOTE — Plan of Care (Signed)
  Problem: Safety: Goal: Ability to remain free from injury will improve Outcome: Progressing   Problem: Skin Integrity: Goal: Risk for impaired skin integrity will decrease Outcome: Progressing   

## 2019-04-11 NOTE — Progress Notes (Signed)
Occupational Therapy Treatment Patient Details Name: Samantha Mckenzie MRN: [id number] DOB: October 12, 1935 Today's Date: 04/11/2019    History of present illness Pt is an 83 y.o. female admitted 04/07/19 with abdominal pain, generalized weakness and falls. Worked up for focal small bowel entiritis; plan for bowel rest and anitbiotics. Head CT negative for acute abnormality. PMH includes CKD 4, HTN, gout, breast CA, macular degeneration. Of note, recent admission 03/19/19-03/27/19 with CAP.   OT comments  Pt repeating, "If I could just home and get some sleep, I would feel much better." Pt cooperative, completed toileting, standing grooming, LB dressing with modified independence to supervision. Ambulated in room with RW with supervision. Pt agreeable to using her RW at all times at home. Continue to recommend home with home health.   Follow Up Recommendations  Home health OT;Supervision - Intermittent    Equipment Recommendations  3 in 1 bedside commode    Recommendations for Other Services      Precautions / Restrictions Precautions Precautions: Fall       Mobility Bed Mobility Overal bed mobility: Modified Independent             General bed mobility comments: used rail, increased time and effort  Transfers Overall transfer level: Modified independent Equipment used: Rolling walker (2 wheeled)             General transfer comment: from bed, chair and toilet    Balance Overall balance assessment: Needs assistance   Sitting balance-Leahy Scale: Good Sitting balance - Comments: no LOB with donning socks     Standing balance-Leahy Scale: Fair Standing balance comment: statically at sink and with managing LB dressing                           ADL either performed or assessed with clinical judgement   ADL Overall ADL's : Needs assistance/impaired     Grooming: Wash/dry hands;Standing;Supervision/safety               Lower Body Dressing: Modified  independent;Sit to/from stand   Toilet Transfer: Supervision/safety;Ambulation;RW;Comfort height toilet   Toileting- Clothing Manipulation and Hygiene: Modified independent;Sit to/from stand       Functional mobility during ADLs: Supervision/safety;Rolling walker       Vision       Perception     Praxis      Cognition Arousal/Alertness: Awake/alert Behavior During Therapy: WFL for tasks assessed/performed Overall Cognitive Status: Within Functional Limits for tasks assessed                                 General Comments: some difficulty generalizing safety instruction ie: walking away from her RW        Exercises     Shoulder Instructions       General Comments      Pertinent Vitals/ Pain       Pain Assessment: No/denies pain  Home Living                                          Prior Functioning/Environment              Frequency  Min 2X/week        Progress Toward Goals  OT Goals(current goals can now be found in the care plan section)  Progress towards OT goals: Progressing toward goals  Acute Rehab OT Goals Patient Stated Goal: To get some rest OT Goal Formulation: With patient Time For Goal Achievement: 04/23/19 Potential to Achieve Goals: Good  Plan Discharge plan remains appropriate    Co-evaluation                 AM-PAC OT "6 Clicks" Daily Activity     Outcome Measure   Help from another person eating meals?: None Help from another person taking care of personal grooming?: A Little Help from another person toileting, which includes using toliet, bedpan, or urinal?: A Little Help from another person bathing (including washing, rinsing, drying)?: A Little Help from another person to put on and taking off regular upper body clothing?: None Help from another person to put on and taking off regular lower body clothing?: None 6 Click Score: 21    End of Session Equipment Utilized During  Treatment: Gait belt;Rolling walker  OT Visit Diagnosis: Unsteadiness on feet (R26.81);Muscle weakness (generalized) (M62.81)   Activity Tolerance Patient tolerated treatment well   Patient Left in chair;with call bell/phone within reach;with chair alarm set   Nurse Communication          Time: 8185-9093 OT Time Calculation (min): 34 min  Charges: OT General Charges $OT Visit: 1 Visit OT Treatments $Self Care/Home Management : 23-37 mins  Nestor Lewandowsky, OTR/L Acute Rehabilitation Services Pager: [phone] Office: [phone]   Malka So 04/11/2019, 12:43 PM

## 2019-04-11 NOTE — Final Consult Note (Signed)
Consultant Final Sign-Off Note    Assessment/Final recommendations  Samantha Mckenzie is a 83 y.o. female followed by me for:  Focal small bowel enteritis right lower quadrant.  - Etiologies to consider are infectious, inflammatory, foreign body perforation - doing better clinically and not having any abdominal pain - continue abx  FEN: soft diet VTE: SCD's, dvt proph per trh ID: Zosyn 06/27>> Follow up: TBD  DISPO: advance diet to soft, continue abx. Can transition to PO at discharge for a total of 10 days of antibiotics    Wound care (if applicable): remove staples from back of head in 6-7 days, July 3rd if she is still admitted.    Diet at discharge: soft diet   Activity at discharge: per primary team   Follow-up appointment:  None needed   Pending results:  Unresulted Labs (From admission, onward)   None       Medication recommendations: PO antibiotics for 10days total, Augmentin    Other recommendations:    Thank you for allowing [place] to participate in the care of your patient!  Please consult [place] again if you have further needs for your patient.  Fraser Din Janeal Abadi 04/11/2019 9:07 AM    Subjective   CC: enteritis  Pt is not having any abdominal pain. She just woke up. She states her son wants her to go to [facility] for rehab. She is not having nausea or vomiting. Not hungry but she is trying to eat.   Objective  Vital signs in last 24 hours: Temp:  [98 F (36.7 C)-98.6 F (37 C)] 98 F (36.7 C) (07/01 0459) Pulse Rate:  [69-89] 76 (07/01 0459) Resp:  [16-17] 16 (07/01 0459) BP: (116-137)/(50-59) 130/58 (07/01 0459) SpO2:  [95 %-97 %] 97 % (07/01 0459)  PE: Gen:  Alert, NAD, pleasant, cooperative Pulm:  Rate and effort normal Abd: Soft, NT/ND, +BS Skin: no rashes noted, warm and dry Psychiatric: She has anormal mood and affect. Herbehavior is normal   Pertinent labs and Studies: Recent Labs    04/08/19 Dec 15, 2035 04/09/19 0234 04/10/19 0257  WBC  3.4* 4.0 4.0  HGB 9.0* 8.4* 9.6*  HCT 26.5* 25.0* 28.6*   BMET Recent Labs    04/10/19 0257 04/11/19 0207  NA 137 139  K 3.9 3.7  CL 98 98  CO2 28 28  GLUCOSE 94 155*  BUN 35* 37*  CREATININE 2.86* 2.88*  CALCIUM 8.3* 8.2*   No results for input(s): LABURIN in the last 72 hours. Results for orders placed or performed during the hospital encounter of 04/07/19  SARS Coronavirus 2 (Hosp order,Performed in [facility] lab via Abbott ID)     Status: None   Collection Time: 04/07/19  4:18 PM   Specimen: Dry Nasal Swab (Abbott ID Now)  Result Value Ref Range Status   SARS Coronavirus 2 (Abbott ID Now) NEGATIVE NEGATIVE Final    Comment: (NOTE) Interpretive Result Comment(s): COVID 19 Positive SARS CoV 2 target nucleic acids are DETECTED. The SARS CoV 2 RNA is generally detectable in upper and lower respiratory specimens during the acute phase of infection.  Positive results are indicative of active infection with SARS CoV 2.  Clinical correlation with patient history and other diagnostic information is necessary to determine patient infection status.  Positive results do not rule out bacterial infection or coinfection with other viruses. The expected result is Negative. COVID 19 Negative SARS CoV 2 target nucleic acids are NOT DETECTED. The SARS CoV 2 RNA is  generally detectable in upper and lower respiratory specimens during the acute phase of infection.  Negative results do not preclude SARS CoV 2 infection, do not rule out coinfections with other pathogens, and should not be used as the sole basis for treatment or other patient management decisions.  Negative results must be combined with clinical  observations, patient history, and epidemiological information. The expected result is Negative. Invalid Presence or absence of SARS CoV 2 nucleic acids cannot be determined. Repeat testing was performed on the submitted specimen and repeated Invalid results were obtained.   If clinically indicated, additional testing on a new specimen with an alternate test methodology [phone]) is advised.  The SARS CoV 2 RNA is generally detectable in upper and lower respiratory specimens during the acute phase of infection. The expected result is Negative. Fact Sheet for Patients:  GolfingFamily.no Fact Sheet for Healthcare Providers: https://www.hernandez-brewer.com/ This test is not yet approved or cleared by the [place] FDA and has been authorized for detection and/or diagnosis of SARS CoV 2 by FDA under an Emergency Use Authorization (EUA).  This EUA will remain in effect (meaning this test can be used) for the duration of the COVID19 d eclaration under Section 564(b)(1) of the Act, 21 U.S.C. section 775 326 9775 3(b)(1), unless the authorization is terminated or revoked sooner. Performed at [facility], [address] [place], [place] [address]   Culture, blood (Routine X 2) w Reflex to ID Panel     Status: None (Preliminary result)   Collection Time: 04/07/19  9:57 PM   Specimen: BLOOD RIGHT FOREARM  Result Value Ref Range Status   Specimen Description BLOOD RIGHT FOREARM  Final   Special Requests   Final    BOTTLES DRAWN AEROBIC ONLY Blood Culture adequate volume   Culture   Final    NO GROWTH 4 DAYS Performed at [facility], [place] [address] [place], [place] [address]    Report Status PENDING  Incomplete  Culture, blood (Routine X 2) w Reflex to ID Panel     Status: None (Preliminary result)   Collection Time: 04/07/19  9:57 PM   Specimen: BLOOD RIGHT HAND  Result Value Ref Range Status   Specimen Description BLOOD RIGHT HAND  Final   Special Requests   Final    BOTTLES DRAWN AEROBIC ONLY Blood Culture adequate volume   Culture   Final    NO GROWTH 4 DAYS Performed at [facility], [place] [address], [place], [place] [address]    Report Status PENDING  Incomplete  SARS Coronavirus 2 (CEPHEID -  Performed in [place] hospital lab), Hosp Order     Status: None   Collection Time: 04/07/19 11:10 PM   Specimen: Nasopharyngeal Swab  Result Value Ref Range Status   SARS Coronavirus 2 NEGATIVE NEGATIVE Final    Comment: (NOTE) If result is NEGATIVE SARS-CoV-2 target nucleic acids are NOT DETECTED. The SARS-CoV-2 RNA is generally detectable in upper and lower  respiratory specimens during the acute phase of infection. The lowest  concentration of SARS-CoV-2 viral copies this assay can detect is 250  copies / mL. A negative result does not preclude SARS-CoV-2 infection  and should not be used as the sole basis for treatment or other  patient management decisions.  A negative result may occur with  improper specimen collection / handling, submission of specimen other  than nasopharyngeal swab, presence of viral mutation(s) within the  areas targeted by this assay, and  inadequate number of viral copies  (<250 copies / mL). A negative result must be combined with clinical  observations, patient history, and epidemiological information. If result is POSITIVE SARS-CoV-2 target nucleic acids are DETECTED. The SARS-CoV-2 RNA is generally detectable in upper and lower  respiratory specimens dur ing the acute phase of infection.  Positive  results are indicative of active infection with SARS-CoV-2.  Clinical  correlation with patient history and other diagnostic information is  necessary to determine patient infection status.  Positive results do  not rule out bacterial infection or co-infection with other viruses. If result is PRESUMPTIVE POSTIVE SARS-CoV-2 nucleic acids MAY BE PRESENT.   A presumptive positive result was obtained on the submitted specimen  and confirmed on repeat testing.  While 2019 novel coronavirus  (SARS-CoV-2) nucleic acids may be present in the submitted sample  additional confirmatory testing may be necessary for epidemiological  and / or clinical management  purposes  to differentiate between  SARS-CoV-2 and other Sarbecovirus currently known to infect humans.  If clinically indicated additional testing with an alternate test  methodology [phone]) is advised. The SARS-CoV-2 RNA is generally  detectable in upper and lower respiratory sp ecimens during the acute  phase of infection. The expected result is Negative. Fact Sheet for Patients:  StrictlyIdeas.no Fact Sheet for Healthcare Providers: BankingDealers.co.za This test is not yet approved or cleared by the [place] FDA and has been authorized for detection and/or diagnosis of SARS-CoV-2 by FDA under an Emergency Use Authorization (EUA).  This EUA will remain in effect (meaning this test can be used) for the duration of the COVID-19 declaration under Section 564(b)(1) of the Act, 21 U.S.C. section 360bbb-3(b)(1), unless the authorization is terminated or revoked sooner. Performed at [facility], [place] [address] [place], [place] [address]     Imaging: No results found.

## 2019-04-11 NOTE — Progress Notes (Signed)
PROGRESS NOTE  Samantha Mckenzie VOZ:[id number] DOB: 1935/12/29 DOA: 04/07/2019 PCP: Colon Branch, MD  Brief History   Samantha Mckenzie is a 83 y.o. female with medical history significant of hypertension, hyperlipidemia, diet-controlled diabetes, GERD, gout, depression, breast cancer (surgery radiation and chemotherapy), CKD stage IV, dCHF, who presents with intermittent diarrhea, abdominal pain, generalized weakness and fall.  Patient was recently hospitalized from 6/8-6/16 due to community-acquired pneumonia.  Patient had negative COVID-19 testing in that admission.  Patient states that she has been having intermittent diarrhea and abdominal pain for more than 2 weeks.  She states that she has been having 2-3 watery diarrhea each day, but no diarrhea today.  She also has intermittent abdominal pain, which is located in the right lower quadrant. She states that her AP could be very severe, sharp, nonradiating, but currently it has subsided.  Patient does not have vomiting currently.  No fever or chills.  Patient states that she has generalized weakness, but no unilateral numbness or tingling in extremities, No facial droop or slurred speech.  Patient states that she fell several times at home, at least one time she injured her head.  Patient states that she still has some cough with little brownish colored mucus production.  Denies shortness of breath, chest pain, runny nose or sore throat.  Patient states that she did not noticed dark stool or blood in stool, but his FOBT is positive in ED today.  Patient denies symptoms of UTI.  The patient underwent an EGD on 01/13/15 by Dr. Henrene Pastor that was normal. She also underwent a colonoscopy by Dr. Henrene Pastor on 09/13/2011 that demonstrated moderate diverticulosis.  Palliative care was consulted as it was the reported that the patient implored upon physical therapy to "take her out back and shoot her."  Consultants  . General surgery  Procedures  . None   Antibiotics   Anti-infectives (From admission, onward)   Start     Dose/Rate Route Frequency Ordered Stop   04/08/19 0300  piperacillin-tazobactam (ZOSYN) IVPB 2.25 g     2.25 g 100 mL/hr over 30 Minutes Intravenous Every 8 hours 04/07/19 2205     04/07/19 1730  piperacillin-tazobactam (ZOSYN) IVPB 3.375 g     3.375 g 100 mL/hr over 30 Minutes Intravenous  Once 04/07/19 1723 04/07/19 1801      Subjective  The patient is resting quietly. She states that she is feeling a little better. No new complaints.  Objective   Vitals:  Vitals:   04/11/19 0459 04/11/19 1553  BP: (!) 130/58 (!) 106/47  Pulse: 76 81  Resp: 16 18  Temp: 98 F (36.7 C) 99.3 F (37.4 C)  SpO2: 97% 93%    Exam:  Constitutional:  . The patient is awake, alert, and oriented x 3. No acute distress. Respiratory:  . No increased work of breathing. . No wheezes, rales, or rhonchi. . No tactile fremitus. Cardiovascular:  . Regular rate and rhythm. . No murmurs, ectopy, or gallups. . No lateral PMI. No thrills.  Abdomen:  . Abdomen is soft, non-distended. Non-tender this morning. . No hernias, masses, or organomegaly. . Hypoactive bowel sounds. Musculoskeletal:  . No cyanosis, clubbing, or edema. Skin:  . No rashes, lesions, ulcers . palpation of skin: no induration or nodules Neurologic:  . CN 2-12 intact . Sensation all 4 extremities intact Psychiatric:  . Mental status o Mood, affect appropriate o Orientation to person, place, time  . judgment and insight appear intact   I  have personally reviewed the following:   Today's Data  . CBC, BMP, Vitals  Micro Data  . Blood cultures  Scheduled Meds: . allopurinol  100 mg Oral Daily  . amLODipine  10 mg Oral Daily  . atorvastatin  20 mg Oral Daily  . calcium-vitamin D  1 tablet Oral Daily  . gabapentin  200 mg Oral QHS  . metoprolol tartrate  25 mg Oral BID  . ondansetron  4 mg Oral TID WC  . pantoprazole  40 mg Oral BID  . vitamin B-12   1,000 mcg Oral Daily  . vitamin C  500 mg Oral Daily   Continuous Infusions: . piperacillin-tazobactam (ZOSYN)  IV 2.25 g (04/11/19 1751)    Principal Problem:   Enteritis Active Problems:   Diabetes mellitus type 2 with complications (HCC)   Hyperlipidemia   GOUT   Normocytic anemia   Essential hypertension   GERD   Depression   Generalized weakness   Hypomagnesemia   Acute respiratory failure with hypoxia (HCC)   Acute renal failure superimposed on stage 4 chronic kidney disease (HCC)   Chronic diastolic CHF (congestive heart failure) (HCC)   Hypokalemia   Hypocalcemia   Fall   Occult blood positive stool   LOS: 4 days    A & P  Enteritis: Patient has been having intermittent diarrhea and abdominal pain.  CT scan showed possible enteritis, cannot rule out small bowel microperforation.  Patient does not have fever or leukocytosis.  Clinically does not meet criteria for sepsis.  Currently hemodynamically stable.  General surgeon, Dr. Kieth Brightly was consulted. She was admitted to a med/surg bed as inpatient. Blood cultures have been drawn. She is receiving IV Zosyn. GI pathogen panel is pending. I appreciate General Surgery's help. Diet has been advanced to a soft diet. Pt is taking very little PO.  Diabetes mellitus type 2 with complications: Diet controlled. ([place]): Last A1c 6.3 on 06/17/18, well controled.  Blood sugar 131. Clear liquids. Will follow FSBS with SSI.   Hyperlipidemia: lipitor  Gout: Allopurinol  Normocytic anemia and positive FOBT: hemoglobin dropped from 10-7.3.  No gross rectal bleeding. Continue PPI bid. IV fluids. Monitor hemoglobin and transfuse for hemoglobin less than 7.0. Avoid NSAIDS. The patient has been typed and screened. Hemoglobin 9.6 today after the patient received one unit of blood in transfusion on 04/08/2019.  HTN: Continue home medications: Amlodipine, metoprolol. Hold for low blood pressure. IV hydralazine prn.  GERD: Protonix bid.   Depression: Hold Remeron due to QTC prolongation. Palliative care consulted due to patient's asking physical therapy to "take her out back and shoot her".  Acute respiratory failure with hypoxia ([place]): Most likely due to incomplete recovery from recently treated community-acquired pneumonia.  Patient had negative COVID-19 test in that admission.  Still has some cough.  Chest x-ray did not show obvious infiltration. Symptomatic treatment for cough, wheezes with as needed albuterol and mucinex. Repeat COVID-19 is negative.   Acute renal failure superimposed on stage 4 chronic kidney disease: Baseline Cre is 1.7-2.4. The patient's creatinine is 2.86 this morning. This is likely due to prerenal secondary to dehydration and continuation of diuretics. Lasix has been held. Monitor creatinine, electrolytes. Hold lasix, avoid nephrotoxic substances and hypotension.  Chronic diastolic CHF (congestive heart failure) ([place]): 2D echo on 06/02/2017 showed EF of 55% with grade 1 diastolic dysfunction.  Patient does not have shortness of breath, no DVT.  No pulmonary edema on chest x-ray.  CHF seem to be compensated. laisx  is being held due to worsening renal function. Continue metoprolol with parameters to hold for hypotension and low heart rate.  Hypokalemia, Hypocalcemia and hypomagnesemia: Supplement and monitor.  Fall and generalized weakness: Patient fell 4 times, most likely due to generalized weakness.  Her generalized weakness is likely multifactorial etiology given multiple comorbidities and acute issues as listed above.  CT head is negative for acute intracranial abnormalities.  No focal neurologic findings on physical examination.  Low suspicions for stroke. PT/OT is seeing the patient and related today that the patient told them several times that they wanted them to take her out back and shoot her. I will order a palliative care consult.  Case management and social work will be consulted for likely  discharge to SNF.  I have seen and examined this patient myself. I have spent 32 minutes in her evaluation and care.  DVT prophylaxis: SCD's Code Status: DNR Family Communication: None available Disposition Plan: tbd   Mcihael Hinderman, DO [facility] Direct contact: see [email or web address]  7PM-7AM contact night coverage as above 04/10/2019, 4:47 PM  LOS: 1 day

## 2019-04-12 ENCOUNTER — Telehealth: Payer: Self-pay | Admitting: Internal Medicine

## 2019-04-12 ENCOUNTER — Telehealth: Payer: Self-pay

## 2019-04-12 DIAGNOSIS — E1122 Type 2 diabetes mellitus with diabetic chronic kidney disease: Secondary | ICD-10-CM | POA: Diagnosis not present

## 2019-04-12 DIAGNOSIS — I129 Hypertensive chronic kidney disease with stage 1 through stage 4 chronic kidney disease, or unspecified chronic kidney disease: Secondary | ICD-10-CM | POA: Diagnosis not present

## 2019-04-12 DIAGNOSIS — J449 Chronic obstructive pulmonary disease, unspecified: Secondary | ICD-10-CM | POA: Diagnosis present

## 2019-04-12 DIAGNOSIS — Z7982 Long term (current) use of aspirin: Secondary | ICD-10-CM | POA: Diagnosis not present

## 2019-04-12 DIAGNOSIS — R531 Weakness: Secondary | ICD-10-CM

## 2019-04-12 DIAGNOSIS — E1142 Type 2 diabetes mellitus with diabetic polyneuropathy: Secondary | ICD-10-CM | POA: Diagnosis not present

## 2019-04-12 DIAGNOSIS — F4321 Adjustment disorder with depressed mood: Secondary | ICD-10-CM | POA: Diagnosis not present

## 2019-04-12 DIAGNOSIS — Z66 Do not resuscitate: Secondary | ICD-10-CM

## 2019-04-12 DIAGNOSIS — Z515 Encounter for palliative care: Secondary | ICD-10-CM

## 2019-04-12 DIAGNOSIS — N184 Chronic kidney disease, stage 4 (severe): Secondary | ICD-10-CM | POA: Diagnosis not present

## 2019-04-12 DIAGNOSIS — Z7189 Other specified counseling: Secondary | ICD-10-CM

## 2019-04-12 DIAGNOSIS — Z8701 Personal history of pneumonia (recurrent): Secondary | ICD-10-CM | POA: Diagnosis not present

## 2019-04-12 LAB — CBC WITH DIFFERENTIAL/PLATELET
Abs Immature Granulocytes: 0.02 10*3/uL (ref 0.00–0.07)
Basophils Absolute: 0 10*3/uL (ref 0.0–0.1)
Basophils Relative: 0 %
Eosinophils Absolute: 0.2 10*3/uL (ref 0.0–0.5)
Eosinophils Relative: 4 %
HCT: 25.8 % — ABNORMAL LOW (ref 36.0–46.0)
Hemoglobin: 8.4 g/dL — ABNORMAL LOW (ref 12.0–15.0)
Immature Granulocytes: 0 %
Lymphocytes Relative: 31 %
Lymphs Abs: 1.6 10*3/uL (ref 0.7–4.0)
MCH: 28.7 pg (ref 26.0–34.0)
MCHC: 32.6 g/dL (ref 30.0–36.0)
MCV: 88.1 fL (ref 80.0–100.0)
Monocytes Absolute: 0.4 10*3/uL (ref 0.1–1.0)
Monocytes Relative: 7 %
Neutro Abs: 3 10*3/uL (ref 1.7–7.7)
Neutrophils Relative %: 58 %
Platelets: 358 10*3/uL (ref 150–400)
RBC: 2.93 MIL/uL — ABNORMAL LOW (ref 3.87–5.11)
RDW: 15.9 % — ABNORMAL HIGH (ref 11.5–15.5)
WBC: 5.2 10*3/uL (ref 4.0–10.5)
nRBC: 0 % (ref 0.0–0.2)

## 2019-04-12 LAB — BASIC METABOLIC PANEL
Anion gap: 11 (ref 5–15)
BUN: 32 mg/dL — ABNORMAL HIGH (ref 8–23)
CO2: 31 mmol/L (ref 22–32)
Calcium: 7.8 mg/dL — ABNORMAL LOW (ref 8.9–10.3)
Chloride: 97 mmol/L — ABNORMAL LOW (ref 98–111)
Creatinine, Ser: 3.02 mg/dL — ABNORMAL HIGH (ref 0.44–1.00)
GFR calc Af Amer: 16 mL/min — ABNORMAL LOW (ref >60)
GFR calc non Af Amer: 14 mL/min — ABNORMAL LOW (ref >60)
Glucose, Bld: 104 mg/dL — ABNORMAL HIGH (ref 70–99)
Potassium: 3.8 mmol/L (ref 3.5–5.1)
Sodium: 139 mmol/L (ref 135–145)

## 2019-04-12 LAB — CULTURE, BLOOD (ROUTINE X 2)
Culture: NO GROWTH
Culture: NO GROWTH
Special Requests: ADEQUATE
Special Requests: ADEQUATE

## 2019-04-12 LAB — GLUCOSE, CAPILLARY: Glucose-Capillary: 94 mg/dL (ref 70–99)

## 2019-04-12 MED ORDER — AMOXICILLIN-POT CLAVULANATE 875-125 MG PO TABS: 1.0000 | ORAL_TABLET | Freq: Two times a day (BID) | ORAL | 0 refills | Status: DC

## 2019-04-12 MED ORDER — ASPIRIN 81 MG PO TABS: 81.0000 mg | ORAL_TABLET | Freq: Every day | ORAL | 0 refills | Status: DC

## 2019-04-12 MED ORDER — AMOXICILLIN-POT CLAVULANATE 875-125 MG PO TABS: 1.0000 | ORAL_TABLET | Freq: Two times a day (BID) | ORAL | 0 refills | Status: AC

## 2019-04-12 MED ORDER — ONDANSETRON HCL 4 MG PO TABS: 4.0000 mg | ORAL_TABLET | Freq: Three times a day (TID) | ORAL | 2 refills | Status: DC | PRN

## 2019-04-12 MED ORDER — HYDROXYZINE HCL 10 MG PO TABS: 10.0000 mg | ORAL_TABLET | Freq: Three times a day (TID) | ORAL | 0 refills | Status: DC | PRN

## 2019-04-12 MED ORDER — METOPROLOL TARTRATE 25 MG PO TABS: 25.0000 mg | ORAL_TABLET | Freq: Two times a day (BID) | ORAL | 1 refills | Status: DC

## 2019-04-12 MED ORDER — DICYCLOMINE HCL 10 MG PO CAPS: 20.0000 mg | ORAL_CAPSULE | Freq: Three times a day (TID) | ORAL | 1 refills | Status: DC

## 2019-04-12 MED ORDER — PREDNISONE 20 MG PO TABS: 20.0000 mg | ORAL_TABLET | Freq: Every day | ORAL | 0 refills | Status: DC

## 2019-04-12 MED ORDER — FUROSEMIDE 20 MG PO TABS: 20.0000 mg | ORAL_TABLET | Freq: Every day | ORAL | 1 refills | Status: DC

## 2019-04-12 MED ORDER — ALBUTEROL SULFATE (2.5 MG/3ML) 0.083% IN NEBU: 2.5000 mg | INHALATION_SOLUTION | RESPIRATORY_TRACT | 2 refills | Status: DC | PRN

## 2019-04-12 MED ORDER — ACETAMINOPHEN 325 MG PO TABS: 650.0000 mg | ORAL_TABLET | Freq: Four times a day (QID) | ORAL | 0 refills | Status: AC | PRN

## 2019-04-12 NOTE — TOC Transition Note (Addendum)
Transition of Care [facility]) - CM/SW Discharge Note   Patient Details  Name: MARYKAY MCCLEOD MRN: [id number] Date of Birth: 1935-12-11  Transition of Care [facility]) CM/SW Contact:  Marilu Favre, RN Phone Number: 04/12/2019, 3:30 PM   Clinical Narrative:     Discussed with patient home health , wants to continue with [facility]. Butch Penny with [facility] aware. Patient agreeable to OP palliative services. Choice offered. Patient would like [facility] for palliative services.   Referral given to [facility] with [facility].  Awaiting oxygen saturation note.   Patient states she has a ride home.   1536 Ordered home oxygen and NEB machine through [place] with [employer]. NEB machine and portable oxygen tank will be brought to patient's room prior to discharge. Patient aware.  Final next level of care: [place] Barriers to Discharge: Continued Medical Work up   Patient Goals and CMS Choice Patient states their goals for this hospitalization and ongoing recovery are:: to return to home CMS Medicare.gov Compare Post Acute Care list provided to:: Patient Choice offered to / list presented to : Patient  Discharge Placement                       Discharge Plan and Services   Discharge Planning Services: (P) CM Consult Post Acute Care Choice: Home Health          DME Arranged: N/A         HH Arranged: PT, OT, Nurse's Aide [place] Agency: [place] [facility]) Date [place]: 04/10/19 Time [place]: 1556 Representative spoke with at [place]: Butch Penny [facility] has been following since admission, need orders  Social Determinants of Health (SDOH) Interventions     Readmission Risk Interventions Readmission Risk Prevention Plan 04/10/2019 03/26/2019  Transportation Screening Complete Complete  PCP or Specialist Appt within 3-5 Days - Complete  HRI or [place] - Complete  Social Work Consult for [place] Planning/Counseling - Complete   Palliative Care Screening - Not Applicable  Medication Review Press photographer) Referral to Pharmacy Complete  PCP or Specialist appointment within 3-5 days of discharge Complete -  [place] or Home Care Consult Complete -  SW Recovery Care/Counseling Consult Complete -  Palliative Care Screening Not Applicable -  [place] Not Applicable -  Some recent data might be hidden

## 2019-04-12 NOTE — Progress Notes (Signed)
Discharged home today,grandaughter,Samantha Mckenzie is driving her home. Personal belongings,discharged instructions given to patient. Advised to pick up medications called in to [place] of choice..Prescription for Prednisone given. Verbalized  Understanding of instructions

## 2019-04-12 NOTE — Progress Notes (Signed)
Manufacturing engineer [facility]) Community Based Palliative Care  Received request from Magdalen Spatz for [facility] Palliative services at home after discharge. Spoke with patient's spouse by phone to confirm interest and arrange first visit. He requested call back tomorrow after patient is home, which we will do. Dr. Larose Kells office made aware. [facility] has approved this referral.   Thank you,  Erling Conte, LCSW [phone]

## 2019-04-12 NOTE — Plan of Care (Signed)
  Problem: Coping: Goal: Level of anxiety will decrease Outcome: Progressing   Problem: Pain Managment: Goal: General experience of comfort will improve Outcome: Progressing   Problem: Safety: Goal: Ability to remain free from injury will improve Outcome: Progressing   

## 2019-04-12 NOTE — Telephone Encounter (Signed)
Plan of care received from [facility]- form signed and faxed to [phone]. Form sent for scanning.

## 2019-04-12 NOTE — Progress Notes (Signed)
Physical Therapy Treatment Patient Details Name: Samantha Mckenzie MRN: [id number] DOB: [birth year] Today's Date: 04/12/2019    History of Present Illness Pt is an 83 y.o. female admitted 04/07/19 with abdominal pain, generalized weakness and falls. Worked up for focal small bowel entiritis; plan for bowel rest and anitbiotics. Head CT negative for acute abnormality. PMH includes CKD 4, HTN, gout, breast CA, macular degeneration. Of note, recent admission 03/19/19-03/27/19 with CAP.    PT Comments    Pt supine in bed and agreeable to gait training.  She presents with minor safety concerns but no true LOB noted.  Plan next session for stair training.  Pt reports she has a RW at home for home use.    Follow Up Recommendations  Home health PT;Supervision - Intermittent     Equipment Recommendations  None recommended by PT    Recommendations for Other Services       Precautions / Restrictions Precautions Precautions: Fall Restrictions Weight Bearing Restrictions: No    Mobility  Bed Mobility Overal bed mobility: Needs Assistance Bed Mobility: Supine to Sit;Sit to Supine     Supine to sit: Modified independent (Device/Increase time) Sit to supine: Modified independent (Device/Increase time)   General bed mobility comments: no assistance to move into and OOB,  Pt is slow and guarded.  Transfers Overall transfer level: Needs assistance Equipment used: Rolling walker (2 wheeled) Transfers: Sit to/from Stand Sit to Stand: Supervision         General transfer comment: from bed and toilet, supervision for safety.  Ambulation/Gait Ambulation/Gait assistance: Supervision Gait Distance (Feet): 500 Feet Assistive device: Rolling walker (2 wheeled) Gait Pattern/deviations: Step-through pattern;Decreased stride length;Trunk flexed Gait velocity: Decreased   General Gait Details: Minor scissoring and RW safety concerns as she fatigues.  No overt LOB.   Stairs              Wheelchair Mobility    Modified Rankin (Stroke Patients Only)       Balance   Sitting-balance support: No upper extremity supported;Feet supported Sitting balance-Leahy Scale: Good       Standing balance-Leahy Scale: Fair                              Cognition Arousal/Alertness: Awake/alert Behavior During Therapy: WFL for tasks assessed/performed Overall Cognitive Status: Within Functional Limits for tasks assessed                                 General Comments: some difficulty generalizing safety instruction ie: walking away from her RW      Exercises      General Comments        Pertinent Vitals/Pain Pain Assessment: Faces Faces Pain Scale: Hurts little more Pain Location: generalized Pain Descriptors / Indicators: Discomfort;Moaning Pain Intervention(s): Monitored during session;Repositioned    Home Living                      Prior Function            PT Goals (current goals can now be found in the care plan section) Acute Rehab PT Goals Patient Stated Goal: To get some rest Potential to Achieve Goals: Good Progress towards PT goals: Progressing toward goals    Frequency    Min 3X/week      PT Plan Current plan remains appropriate    Co-evaluation  AM-PAC PT "6 Clicks" Mobility   Outcome Measure  Help needed turning from your back to your side while in a flat bed without using bedrails?: None Help needed moving from lying on your back to sitting on the side of a flat bed without using bedrails?: None Help needed moving to and from a bed to a chair (including a wheelchair)?: A Little Help needed standing up from a chair using your arms (e.g., wheelchair or bedside chair)?: A Little Help needed to walk in hospital room?: A Little Help needed climbing 3-5 steps with a railing? : A Little 6 Click Score: 20    End of Session Equipment Utilized During Treatment: Gait belt Activity  Tolerance: Patient tolerated treatment well Patient left: in bed;with call bell/phone within reach;with bed alarm set Nurse Communication: Mobility status PT Visit Diagnosis: Muscle weakness (generalized) (M62.81);Unsteadiness on feet (R26.81)     Time: 1610-9604 PT Time Calculation (min) (ACUTE ONLY): 17 min  Charges:  $Gait Training: 8-22 mins                     Governor Rooks, PTA Acute Rehabilitation Services Pager [phone] Office [phone]     Elliet Goodnow Eli Hose 04/12/2019, 3:07 PM

## 2019-04-12 NOTE — Progress Notes (Signed)
SATURATION QUALIFICATIONS: (This note is used to comply with regulatory documentation for home oxygen)  Patient Saturations on Room Air at Rest =90%  Patient Saturations on Room Air while Ambulating = 87%  Patient Saturations on 2 Liters of oxygen while Ambulating = 90%  Please briefly explain why patient needs home oxygen:

## 2019-04-12 NOTE — Discharge Summary (Addendum)
Samantha Mckenzie, is a 83 y.o. female  DOB 1936/01/26  MRN [id number].  Admission date:  04/07/2019  Admitting Physician  Ivor Costa, MD  Discharge Date:  04/12/2019   Primary MD  Colon Branch, MD  Recommendations for primary care physician for things to follow:   1)Generalized Weakness and Recurrent Falls----you are at risk for further falls, you have refused skilled nursing facility rehab at this time 2) take medications as prescribed 3) soft diet advised  Admission Diagnosis  Hypokalemia [E87.6] Enteritis [K52.9] Weakness [R53.1] Iron deficiency anemia, unspecified iron deficiency anemia type [D50.9]   Discharge Diagnosis  Hypokalemia [E87.6] Enteritis [K52.9] Weakness [R53.1] Iron deficiency anemia, unspecified iron deficiency anemia type [D50.9]   Principal Problem:   Enteritis Active Problems:   Acute respiratory failure with hypoxia (HCC)   Chronic diastolic CHF (congestive heart failure) (HCC)   COPD (chronic obstructive pulmonary disease) ([place])   Diabetes mellitus type 2 with complications ([place])   Hyperlipidemia   GOUT   Normocytic anemia   Essential hypertension   GERD   Depression   Generalized weakness   Hypomagnesemia   Acute renal failure superimposed on stage 4 chronic kidney disease ([place])   Hypokalemia   Hypocalcemia   Fall   Occult blood positive stool      Past Medical History:  Diagnosis Date   Allergic rhinitis    Anemia    Breast CA ([place])    surgery, chemo, XRT; had peripheral neuropathy (imbalance at times) after chemo   Cellulitis    Left arm, recurrent    Cellulitis LEFT arm recurrent 12/08/2011   Chronic renal disease, stage IV ([place])    Dr. Moshe Cipro   Depression    Fatigue    GERD (gastroesophageal reflux disease)    gastritis, EGD 02/2007   Gout    Hyperlipidemia    Hypertension    Macular degeneration 2011   Normal cardiac stress  test 12/2014   Osteoarthritis    Renal insufficiency     Past Surgical History:  Procedure Laterality Date   cataracts bilaterally  1 -2012   LUNG BIOPSY  1999   neg   MASTECTOMY Left 1999   and lymphnodes      HPI  from the history and physical done on the day of admission:    Patient coming from:  The patient is coming from home.  At baseline, pt is partially dependent for most of ADL.        Chief Complaint: Intermittent diarrhea abdominal pain, generalized weakness, fall  HPI: Samantha Mckenzie is a 83 y.o. female with medical history significant of hypertension, hyperlipidemia, diet-controlled diabetes, GERD, gout, depression, breast cancer (surgery radiation and chemotherapy), CKD stage IV, dCHF, who presents with intermittent diarrhea, abdominal pain, generalized weakness and fall.  Patient was recently hospitalized from 6/8-6/16 due to community-acquired pneumonia.  Patient had negative COVID-19 testing in that admission.  Patient states that she has been having intermittent diarrhea and abdominal pain for more than 2 weeks.  She  states that she has been having 2-3 watery diarrhea each day, but no diarrhea today.  She also has intermittent abdominal pain, which is located in the right lower quadrant. She states that her AP could be very severe, sharp, nonradiating, but currently it has subsided.  Patient does not have vomiting currently.  No fever or chills.  Patient states that she has generalized weakness, but no unilateral numbness or tingling in extremities, No facial droop or slurred speech.  Patient states that she fell several times at home, at least one time she injured her head.  Patient states that she still has some cough with little brownish colored mucus production.  Denies shortness of breath, chest pain, runny nose or sore throat.  Patient states that she did not noticed dark stool or blood in stool, but his FOBT is positive in ED today.  Patient denies symptoms of  UTI.  Endoscopy: EGD by Dr. Henrene Pastor on 01/13/15: normal Colonoscopy by Dr. Henrene Pastor on 09/13/2011: Moderate diverticulosis  ED Course: pt was found to have WBC 4.7, lipase 38, negative COVID-19 test ([facility]), positive FOBT, hemoglobin dropped from 10.0 on 03/24/2019 and 7.3, worsening renal function, potassium 2.3, magnesium 1.3, calcium 6.6, temperature 99.3, oxygen saturation at upper 80% on RA -->92-100% on 2L of nasal cannula oxygen, heart rate 93, blood pressure 152/56, CT head is negative for acute intracranial abnormalities.  Chest x-ray showed low volume without obvious infiltration.  CT abdomen/pelvis showed possible enteritis, but cannot rule out possibility of microperforation.  Patient is admitted to [facility] bed as inpatient.  General surgeon, Dr. Kieth Brightly was consulted.   Hospital Course:      Brief Summary 83 y.o. female with multiple medical problems including hypertension, hyperlipidemia, diet-controlled diabetes, GERD, gout, depression, breast cancer (left mastectomy, radiation, and chemotherapy), CKD stage IV,and dCHF. She presented to ED with complaints of diarrhea, generalized weakness, falls, and abdominal pain. She was recently hospitalized 6/8-6/16/2020 due to CAP. CT scan showed possible enteritis, questionable small bowel microperforation, Re-admitted 04/07/19  A/p 1)Enteritis--- concerns about possible microperforation so patient treated with antibiotics as below ----no further vomiting or diarrhea, EGD on 01/13/15 by Dr. Henrene Pastor that was normal. She also underwent a colonoscopy by Dr. Henrene Pastor on 09/13/2011 that demonstrated moderate diverticulosis.  She was treated with IV Zosyn due to possible microperforation, okay to discharge on p.o. Augmentin, Blood cx is NGTD--no fevers, no leukocytosis, surgical consult appreciated recommend non-operative management  2)Chronic Anemia--- heme positive stool in the setting of enteritis, please see EGD and colonoscopy as noted above #1, baseline  hemoglobin usually around  10 to 11 patient received transfusion on 04/08/2019 hemoglobin currently 8.4 avoid NSAIDs  3) DM2-  A1c is 6.4, oral intake is not great, patient is at risk for hypoglycemic episodes, avoid over aggressive control  4) Acute on chronic hypoxic respiratory failure--- discharge on home oxygen at 2 L/min--- SATURATION QUALIFICATIONS: (Thisnote is usedto comply with regulatory documentation for home oxygen)  Patient Saturations on Room Air at Rest =90 %  Patient Saturations on Room Air while Ambulating =87 %  Patient Saturations on2Liters of oxygen while Ambulating = 93 to 95 %   Patient needs continuous O2 at 2 L/min continuously via nasal cannula with humidifier, with gaseous portability and conserving device    5)Generalized weakness with Recurrent Falls----CT head without acute findings, patient declines SNF rehab, Discussed with pt's husband at [phone] ....husband is okay with patient coming home with home health  6)HTN--stable, continue Amlodipine 10 mg daily and Metoprolol  25 mg twice daily  7)AKI----acute kidney injury on CKD stage - III,  creatinine on admission= 3.54  ,   baseline creatinine = 2.78 (03/19/2019)   , creatinine is now= 3.02  , renally adjust medications, avoid nephrotoxic agents/dehydration/hypotension   8)HFpEF--- dCHF--- stable, Lasix decreased to 20 mg daily due to poor oral intake to avoid dehydration, continue metoprolol 25 mg twice daily  9)Social/Ethics--- patient is a DNR/DNR, palliative care consult appreciated  Discharge Condition: stable  Follow UP----PCP in a week Follow-up Information    [facility].   Specialty: Home Health Services Why: phone [place] Follow up.   Why: phone [phone] oxygen and NEB machine        [facility] Follow up.   Why: palliative care  Contact information: [place]  [place]         Consults obtained -General surgery/palliative care  Diet and Activity recommendation:  As advised  Discharge Instructions    Discharge Instructions    Call MD for:  difficulty breathing, headache or visual disturbances   Complete by: As directed    Call MD for:  persistant dizziness or light-headedness   Complete by: As directed    Call MD for:  persistant nausea and vomiting   Complete by: As directed    Call MD for:  temperature >100.4   Complete by: As directed    Diet - low sodium heart healthy   Complete by: As directed    Soft diet advised   Discharge instructions   Complete by: As directed    1)Generalized Weakness and Recurrent Falls----you are at risk for further falls, you have refused skilled nursing facility rehab at this time 2) take medications as prescribed 3) soft diet advised 4)Avoid ibuprofen/Advil/Aleve/Motrin/Goody Powders/Naproxen/BC powders/Meloxicam/Diclofenac/Indomethacin and other Nonsteroidal anti-inflammatory medications as these will make you more likely to bleed and can cause stomach ulcers, can also cause Kidney problems.   Increase activity slowly   Complete by: As directed         Discharge Medications     Allergies as of 04/12/2019      Reactions   Codeine Nausea And Vomiting   Morphine Nausea And Vomiting   Oxycodone-acetaminophen Nausea And Vomiting   Sulfonamide Derivatives Nausea And Vomiting   Tramadol Hcl Nausea And Vomiting      Medication List    TAKE these medications   acetaminophen 325 MG tablet Commonly known as: TYLENOL Take 2 tablets (650 mg total) by mouth every 6 (six) hours as needed for mild pain, fever or headache (or Fever >/= 101).   albuterol (2.5 MG/3ML) 0.083% nebulizer solution Commonly known as: PROVENTIL Take 3 mLs (2.5 mg total) by nebulization every 4 (four) hours as needed for wheezing or shortness of breath.   allopurinol 100 MG tablet Commonly known as: ZYLOPRIM Take 1 tablet (100  mg total) by mouth daily.   amLODipine 10 MG tablet Commonly known as: NORVASC Take 1 tablet (10 mg total) by mouth daily.   amoxicillin-clavulanate 875-125 MG tablet Commonly known as: Augmentin Take 1 tablet by mouth 2 (two) times daily for 4 days.   aspirin 81 MG tablet Take 1 tablet (81 mg total) by mouth daily with breakfast. What changed: when to take this   atorvastatin 20 MG tablet Commonly known as: LIPITOR Take 1 tablet (20 mg total) by mouth daily.   CALTRATE 600+D3 PO  Take 1 tablet by mouth daily.   CENTRUM SILVER PO Take 1 tablet by mouth daily.   dicyclomine 10 MG capsule Commonly known as: BENTYL Take 2 capsules (20 mg total) by mouth 4 (four) times daily -  before meals and at bedtime.   furosemide 20 MG tablet Commonly known as: LASIX Take 1 tablet (20 mg total) by mouth daily. What changed:   medication strength  how much to take  when to take this   hydrOXYzine 10 MG tablet Commonly known as: ATARAX/VISTARIL Take 1 tablet (10 mg total) by mouth 3 (three) times daily as needed for nausea.   loperamide 2 MG tablet Commonly known as: IMODIUM A-D Take 2 mg by mouth 4 (four) times daily as needed for diarrhea or loose stools.   metoprolol tartrate 25 MG tablet Commonly known as: LOPRESSOR Take 1 tablet (25 mg total) by mouth 2 (two) times daily.   ondansetron 4 MG tablet Commonly known as: ZOFRAN Take 1 tablet (4 mg total) by mouth every 8 (eight) hours as needed for nausea or vomiting. What changed:   when to take this  reasons to take this   pantoprazole 40 MG tablet Commonly known as: PROTONIX Take 40 mg by mouth daily.   predniSONE 20 MG tablet Commonly known as: Deltasone Take 1 tablet (20 mg total) by mouth daily with breakfast.   vitamin B-12 1000 MCG tablet Commonly known as: CYANOCOBALAMIN Take 1,000 mcg by mouth daily.   vitamin C 500 MG tablet Commonly known as: ASCORBIC ACID Take 500 mg by mouth daily.   Vitamin D3  25 MCG (1000 UT) Caps Take 1 capsule by mouth daily.            Durable Medical Equipment  (From admission, onward)         Start     Ordered   04/12/19 1534  For home use only DME Nebulizer machine  Once    Question Answer Comment  Patient needs a nebulizer to treat with the following condition COPD (chronic obstructive pulmonary disease) ([place])   Length of Need Lifetime      04/12/19 1533   04/12/19 1532  For home use only DME oxygen  Once    Comments: SATURATION QUALIFICATIONS: (Thisnote is usedto comply with regulatory documentation for home oxygen)  Patient Saturations on Room Air at Rest =90 %  Patient Saturations on Room Air while Ambulating =87 %  Patient Saturations on2Liters of oxygen while Ambulating = 93 to 95 %   Patient needs continuous O2 at 2 L/min continuously via nasal cannula with humidifier, with gaseous portability and conserving device  Question Answer Comment  Length of Need Lifetime   Mode or (Route) Nasal cannula   Liters per Minute 2   Frequency Continuous (stationary and portable oxygen unit needed)   Oxygen conserving device Yes   Oxygen delivery system Gas      04/12/19 1533          Major procedures and Radiology Reports - PLEASE review detailed and final reports for all details, in brief -   Ct Abdomen Pelvis Wo Contrast  Result Date: 04/07/2019 CLINICAL DATA:  Weakness, intermittent nausea and diarrhea since 03/25/2019, 4 falls, unspecified abdominal pain, history stage IV chronic renal disease, GERD, hypertension, hyperlipidemia, breast cancer EXAM: CT ABDOMEN AND PELVIS WITHOUT CONTRAST TECHNIQUE: Multidetector CT imaging of the abdomen and pelvis was performed following the standard protocol without IV contrast. Sagittal and coronal MPR images reconstructed from axial data  set. No oral contrast was administered COMPARISON:  03/19/2019 FINDINGS: Lower chest: Bibasilar atelectasis with questionable coexistent consolidation  RIGHT lower lobe. Hepatobiliary: Gallbladder and liver normal appearance Pancreas: Atrophic pancreas without mass Spleen: Normal size with a few scattered calcified granulomata. Adrenals/Urinary Tract: Adrenal glands normal appearance. Minimally dilated renal pelves bilaterally without hydroureter or ureteral calcification. 2.2 x 2.3 cm exophytic RIGHT renal cyst. Intermediate attenuation 11 mm nodule posterior LEFT kidney image 33 versus lobulated cortex unchanged. Ureters and bladder otherwise unremarkable. Stomach/Bowel: Sigmoid diverticulosis without evidence of diverticulitis. Appendix not definitely visualized. Loop of small bowel in the RIGHT upper lateral pelvis demonstrates mild wall thickening and surrounding inflammatory changes compatible with enteritis. Several tiny foci of gas are seen either within or immediately adjacent to the small bowel wall, cannot exclude microperforation. Small bowel proximal to this is upper normal in caliber and demonstrates a small bowel stool sign suggesting prolonged transit. No definite dilatation/obstruction. Vascular/Lymphatic: Atherosclerotic calcifications aorta and iliac arteries without aneurysm. No adenopathy. Reproductive: Atrophic uterus and ovaries Other: No free air or free fluid.  No hernia. Musculoskeletal: Osseous demineralization. IMPRESSION: Focal inflammatory process of the small bowel loop in the upper RIGHT lateral pelvis with bowel wall thickening and adjacent infiltrative changes compatible with enteritis. This could be due to infection, focal inflammatory bowel disease, foreign body perforation. Tiny foci of gas within or immediately adjacent to the small bowel wall at this site, cannot exclude micro perforation. No definite bowel dilatation to suggest obstruction, though small bowel stool sign is seen proximal to this indicating prolonged transit. No free air or abscess identified. Findings called to Dr. Tamera Punt on 04/07/2019 at 1722 hrs.  Electronically Signed   By: Lavonia Dana M.D.   On: 04/07/2019 17:23   Ct Abdomen Pelvis Wo Contrast  Result Date: 03/19/2019 CLINICAL DATA:  Abdominal pain with diverticulitis suspected. EXAM: CT ABDOMEN AND PELVIS WITHOUT CONTRAST TECHNIQUE: Multidetector CT imaging of the abdomen and pelvis was performed following the standard protocol without IV contrast. COMPARISON:  06/20/2018. FINDINGS: Lower chest: There is airspace consolidation at the right lung base. There is atelectasis at the left lung base. The heart size is enlarged. Aortic calcifications are noted. Hepatobiliary: No focal liver abnormality is seen. No gallstones, gallbladder wall thickening, or biliary dilatation. Pancreas: Unremarkable. No pancreatic ductal dilatation or surrounding inflammatory changes. Spleen: A few splenic calcifications are noted. Adrenals/Urinary Tract: There are bilateral stable renal cortical cysts, some of which are hyperdense especially on the left. There is no hydronephrosis. There are no radiopaque obstructing kidney stones. The bladder is significantly distended. The adrenal glands are unremarkable. Stomach/Bowel: There is sigmoid diverticulosis without definite CT evidence of diverticulitis. There is no evidence of a small-bowel obstruction. The appendix is not reliably identified, however there are no inflammatory changes in the right lower quadrant. The stomach is unremarkable. Vascular/Lymphatic: Aortic atherosclerosis. No enlarged abdominal or pelvic lymph nodes. Reproductive: Uterus and bilateral adnexa are unremarkable. Other: No abdominal wall hernia or abnormality. No abdominopelvic ascites. Musculoskeletal: No acute or significant osseous findings. IMPRESSION: 1. Findings concerning for a right lower lobe pneumonia. 2. Sigmoid diverticulosis without CT evidence of diverticulitis. 3. Distended urinary bladder. 4.  Aortic Atherosclerosis (ICD10-I70.0). Electronically Signed   By: Constance Holster M.D.   On:  03/19/2019 22:55   Ct Head Wo Contrast  Result Date: 04/07/2019 CLINICAL DATA:  Weakness, multiple falls EXAM: CT HEAD WITHOUT CONTRAST TECHNIQUE: Contiguous axial images were obtained from the base of the skull through the vertex without intravenous  contrast. COMPARISON:  04/04/2019 FINDINGS: Brain: No evidence of acute infarction, hemorrhage, hydrocephalus, extra-axial collection or mass lesion/mass effect. Mild periventricular white matter hypodensity. Vascular: No hyperdense vessel or unexpected calcification. Skull: Normal. Negative for fracture or focal lesion. Sinuses/Orbits: No acute finding. Other: None. IMPRESSION: No acute intracranial pathology. Electronically Signed   By: Eddie Candle M.D.   On: 04/07/2019 17:07   Ct Head Wo Contrast  Result Date: 04/04/2019 CLINICAL DATA:  [place] this morning, hitting back of head on a dresser. EXAM: CT HEAD WITHOUT CONTRAST TECHNIQUE: Contiguous axial images were obtained from the base of the skull through the vertex without intravenous contrast. COMPARISON:  Brain MRI 06/11/2017 FINDINGS: Brain: There is no evidence of acute infarct, intracranial hemorrhage, mass, midline shift, or extra-axial fluid collection. Mild cerebral atrophy is unchanged. Vascular: Calcified atherosclerosis at the skull base. No hyperdense vessel. Skull: No fracture or focal osseous lesion. Sinuses/Orbits: Visualized paranasal sinuses and mastoid air cells are clear. Visualized orbits are unremarkable. Other: Mild posterior scalp swelling and gas with skin staples in place. IMPRESSION: 1. No evidence of acute intracranial abnormality. 2. Mild posterior scalp swelling. Electronically Signed   By: Logan Bores M.D.   On: 04/04/2019 16:54   US Abdomen Complete  Result Date: 03/23/2019 CLINICAL DATA:  Abdominal pain, acute renal injury EXAM: ABDOMEN ULTRASOUND COMPLETE COMPARISON:  03/19/2019 CT of the abdomen and pelvis FINDINGS: Gallbladder: Well distended with cholelithiasis and  gallbladder sludge. No pericholecystic fluid is noted. Negative sonographic Murphy's sign is elicited. Common bile duct: Diameter: 3.9 mm. Liver: No focal lesion identified. Within normal limits in parenchymal echogenicity. Portal vein is patent on color Doppler imaging with normal direction of blood flow towards the liver. IVC: No abnormality visualized. Pancreas: Visualized portion unremarkable. Spleen: Size and appearance within normal limits. Right Kidney: Length: 7.3 cm. 2.5 cm cyst is noted in the midportion of the right kidney stable from the prior CT. Increased echogenicity is noted. Left Kidney: Length: 7.5 cm. No hydronephrosis is noted. Prominent extrarenal pelvis on the left is noted similar to that seen on CT. Increased echogenicity is noted. Abdominal aorta: No aneurysm visualized. Other findings: None. IMPRESSION: Cholelithiasis and gallbladder sludge without complicating factors. Increased echogenicity consistent with medical renal disease. Right renal cyst. Electronically Signed   By: Inez Catalina M.D.   On: 03/23/2019 15:20   Dg Chest Port 1 View  Result Date: 04/07/2019 CLINICAL DATA:  83 year old female with nausea, diarrhea, weakness and frequent falls EXAM: PORTABLE CHEST 1 VIEW COMPARISON:  Prior chest x-ray 03/19/2019 FINDINGS: Stable cardiomegaly. Surgical changes of prior left mastectomy and axillary nodal dissection. Subtle patchy right basilar airspace opacity favored to reflect atelectasis in the setting of low inspiratory volumes. No pulmonary edema. No large effusion or pneumothorax. No acute osseous abnormality. IMPRESSION: Low inspiratory volumes and stable cardiomegaly. No acute cardiopulmonary process. Electronically Signed   By: Jacqulynn Cadet M.D.   On: 04/07/2019 17:06   Dg Chest Port 1 View  Result Date: 03/19/2019 CLINICAL DATA:  Lower abdominal pain and nausea. EXAM: PORTABLE CHEST 1 VIEW COMPARISON:  Chest x-ray dated 06/02/2011 FINDINGS: The heart size is  enlarged. There is mild volume overload without overt pulmonary edema. There are small bilateral pleural effusions. Multiple surgical staples project over the left axilla. There is no displaced fracture. No large focal area of consolidation. There is elevation of the right hemidiaphragm. IMPRESSION: 1. Cardiomegaly with mild volume overload. 2. Trace bilateral pleural effusions. Electronically Signed   By: Constance Holster  M.D.   On: 03/19/2019 23:52    Micro Results   Recent Results (from the past 240 hour(s))  SARS Coronavirus 2 (Hosp order,Performed in [facility] lab via [facility])     Status: None   Collection Time: 04/07/19  4:18 PM   Specimen: Dry Nasal Swab ([facility] Now)  Result Value Ref Range Status   SARS Coronavirus 2 ([facility] Now) NEGATIVE NEGATIVE Final    Comment: (NOTE) Interpretive Result Comment(s): COVID 19 Positive SARS CoV 2 target nucleic acids are DETECTED. The SARS CoV 2 RNA is generally detectable in upper and lower respiratory specimens during the acute phase of infection.  Positive results are indicative of active infection with SARS CoV 2.  Clinical correlation with patient history and other diagnostic information is necessary to determine patient infection status.  Positive results do not rule out bacterial infection or coinfection with other viruses. The expected result is Negative. COVID 19 Negative SARS CoV 2 target nucleic acids are NOT DETECTED. The SARS CoV 2 RNA is generally detectable in upper and lower respiratory specimens during the acute phase of infection.  Negative results do not preclude SARS CoV 2 infection, do not rule out coinfections with other pathogens, and should not be used as the sole basis for treatment or other patient management decisions.  Negative results must be combined with clinical  observations, patient history, and epidemiological information. The expected result is Negative. Invalid Presence or absence of SARS  CoV 2 nucleic acids cannot be determined. Repeat testing was performed on the submitted specimen and repeated Invalid results were obtained.  If clinically indicated, additional testing on a new specimen with an alternate test methodology [phone]) is advised.  The SARS CoV 2 RNA is generally detectable in upper and lower respiratory specimens during the acute phase of infection. The expected result is Negative. Fact Sheet for Patients:  GolfingFamily.no Fact Sheet for Healthcare Providers: https://www.hernandez-brewer.com/ This test is not yet approved or cleared by the [place] FDA and has been authorized for detection and/or diagnosis of SARS CoV 2 by FDA under an Emergency Use Authorization (EUA).  This EUA will remain in effect (meaning this test can be used) for the duration of the COVID19 d eclaration under Section 564(b)(1) of the Act, 21 U.S.C. section [phone] 3(b)(1), unless the authorization is terminated or revoked sooner. Performed at [facility], [address] [place], [place] [id number]   Culture, blood (Routine X 2) w Reflex to ID Panel     Status: None   Collection Time: 04/07/19  9:57 PM   Specimen: BLOOD RIGHT FOREARM  Result Value Ref Range Status   Specimen Description BLOOD RIGHT FOREARM  Final   Special Requests   Final    BOTTLES DRAWN AEROBIC ONLY Blood Culture adequate volume   Culture   Final    NO GROWTH 5 DAYS Performed at [facility], [place] [address] [place], [place] [address]    Report Status 04/12/2019 FINAL  Final  Culture, blood (Routine X 2) w Reflex to ID Panel     Status: None   Collection Time: 04/07/19  9:57 PM   Specimen: BLOOD RIGHT HAND  Result Value Ref Range Status   Specimen Description BLOOD RIGHT HAND  Final   Special Requests   Final    BOTTLES DRAWN AEROBIC ONLY Blood Culture adequate volume   Culture   Final    NO GROWTH 5 DAYS Performed at [facility],  [address], [place], [place] [address]    Report Status 04/12/2019 FINAL  Final  SARS Coronavirus 2 (CEPHEID - Performed in [place] hospital lab), Hosp Order     Status: None   Collection Time: 04/07/19 11:10 PM   Specimen: Nasopharyngeal Swab  Result Value Ref Range Status   SARS Coronavirus 2 NEGATIVE NEGATIVE Final    Comment: (NOTE) If result is NEGATIVE SARS-CoV-2 target nucleic acids are NOT DETECTED. The SARS-CoV-2 RNA is generally detectable in upper and lower  respiratory specimens during the acute phase of infection. The lowest  concentration of SARS-CoV-2 viral copies this assay can detect is 250  copies / mL. A negative result does not preclude SARS-CoV-2 infection  and should not be used as the sole basis for treatment or other  patient management decisions.  A negative result may occur with  improper specimen collection / handling, submission of specimen other  than nasopharyngeal swab, presence of viral mutation(s) within the  areas targeted by this assay, and inadequate number of viral copies  (<250 copies / mL). A negative result must be combined with clinical  observations, patient history, and epidemiological information. If result is POSITIVE SARS-CoV-2 target nucleic acids are DETECTED. The SARS-CoV-2 RNA is generally detectable in upper and lower  respiratory specimens dur ing the acute phase of infection.  Positive  results are indicative of active infection with SARS-CoV-2.  Clinical  correlation with patient history and other diagnostic information is  necessary to determine patient infection status.  Positive results do  not rule out bacterial infection or co-infection with other viruses. If result is PRESUMPTIVE POSTIVE SARS-CoV-2 nucleic acids MAY BE PRESENT.   A presumptive positive result was obtained on the submitted specimen  and confirmed on repeat testing.  While 2019 novel coronavirus  (SARS-CoV-2) nucleic acids may be present in the submitted  sample  additional confirmatory testing may be necessary for epidemiological  and / or clinical management purposes  to differentiate between  SARS-CoV-2 and other Sarbecovirus currently known to infect humans.  If clinically indicated additional testing with an alternate test  methodology [phone]) is advised. The SARS-CoV-2 RNA is generally  detectable in upper and lower respiratory sp ecimens during the acute  phase of infection. The expected result is Negative. Fact Sheet for Patients:  StrictlyIdeas.no Fact Sheet for Healthcare Providers: BankingDealers.co.za This test is not yet approved or cleared by the [place] FDA and has been authorized for detection and/or diagnosis of SARS-CoV-2 by FDA under an Emergency Use Authorization (EUA).  This EUA will remain in effect (meaning this test can be used) for the duration of the COVID-19 declaration under Section 564(b)(1) of the Act, 21 U.S.C. section 360bbb-3(b)(1), unless the authorization is terminated or revoked sooner. Performed at [facility], [place] [address], [place], [place] [address]        Today   Subjective    Shenica Holzheimer today has no new complaints, appetite is not great, no vomiting or diarrhea, patient requesting and insisting on being discharged home with home health services, --She declines SNF rehab, --After discussion with patient's husband will discharge home with home health          Patient has been seen and examined prior to discharge   Objective   Blood pressure 136/63, pulse 75, temperature (!) 97.5 F (36.4 C), temperature source Oral, resp. rate 16, height 5' 3"  (1.6 m), weight 59 kg, SpO2 95 %.   Intake/Output Summary (Last 24 hours)  at 04/12/2019 1615 Last data filed at 04/12/2019 0900 Gross per 24 hour  Intake 462 ml  Output --  Net 462 ml   Exam Gen:- Awake Alert, no acute distress  HEENT:- [place].AT, No sclera icterus Nose- [place]  2L/min Neck-Supple Neck,No JVD,.  Lungs-  CTAB , good air movement bilaterally  CV- S1, S2 normal, regular Abd-  +ve B.Sounds, Abd Soft, No tenderness,    Extremity/Skin:- No  edema,   good pulses Psych-affect is appropriate, oriented x3 Neuro-generalized weakness, no new focal deficits, no tremors    Data Review   CBC w Diff:  Lab Results  Component Value Date   WBC 5.2 04/12/2019   HGB 8.4 (L) 04/12/2019   HGB 11.1 (L) 01/30/2009   HCT 25.8 (L) 04/12/2019   HCT 33.3 (L) 01/30/2009   PLT 358 04/12/2019   PLT 180 01/30/2009   LYMPHOPCT 31 04/12/2019   LYMPHOPCT 36.7 01/30/2009   MONOPCT 7 04/12/2019   MONOPCT 7.1 01/30/2009   EOSPCT 4 04/12/2019   EOSPCT 2.0 01/30/2009   BASOPCT 0 04/12/2019   BASOPCT 0.3 01/30/2009    CMP:  Lab Results  Component Value Date   NA 139 04/12/2019   NA 139 01/20/2015   K 3.8 04/12/2019   CL 97 (L) 04/12/2019   CO2 31 04/12/2019   BUN 32 (H) 04/12/2019   BUN 39 (A) 01/20/2015   CREATININE 3.02 (H) 04/12/2019   GLU 112 01/20/2015   PROT 6.3 (L) 04/11/2019   ALBUMIN 2.0 (L) 04/11/2019   BILITOT 1.0 04/11/2019   ALKPHOS 60 04/11/2019   AST 24 04/11/2019   ALT 19 04/11/2019  .   Total Discharge time is about 33 minutes  Roxan Hockey M.D on 04/12/2019 at 4:15 PM  Go to [email or web address] -  for contact info  [facility] - Office  [phone]

## 2019-04-12 NOTE — Discharge Instructions (Signed)
1)Generalized Weakness and Recurrent Falls----you are at risk for further falls, you have refused skilled nursing facility rehab at this time 2) take medications as prescribed 3) soft diet advised 4)Avoid ibuprofen/Advil/Aleve/Motrin/Goody Powders/Naproxen/BC powders/Meloxicam/Diclofenac/Indomethacin and other Nonsteroidal anti-inflammatory medications as these will make you more likely to bleed and can cause stomach ulcers, can also cause Kidney problems.

## 2019-04-12 NOTE — Consult Note (Signed)
Consultation Note Date: 04/12/2019   Patient Name: Samantha Mckenzie  DOB: 11-07-1935  MRN: [id number]  Age / Sex: 83 y.o., female   PCP: Colon Branch, MD Referring Physician: Roxan Hockey, MD   REASON FOR CONSULTATION:Establishing goals of care  Palliative Care consult requested for this 83 y.o. female with multiple medical problems including hypertension, hyperlipidemia, diet-controlled diabetes, GERD, gout, depression, breast cancer (left mastectomy, radiation, and chemotherapy), CKD stage IV, and dCHF. She presented to ED with complaints of diarrhea, generalized weakness, falls, and abdominal pain. She was recently hospitalized 6/8-6/16/2020 due to CAP. CT scan showed possible enteritis, questionable small bowel microperforation. She is being followed by general surgery and receiving IV antibiotics for enteritis.    Clinical Assessment and Goals of Care: I have reviewed medical records including lab results, imaging, Epic notes, and MAR, received report from the bedside RN, and assessed the patient. I met at the bedside with patient to discuss diagnosis prognosis, GOC, EOL wishes, disposition and options. She is awake, alert, and oriented x3. She is sitting up in recliner. Denies pain or shortness of breath. States she is ready to get back home in her own environment.   I introduced Palliative Medicine as specialized medical care for people living with serious illness. It focuses on providing relief from the symptoms and stress of a serious illness. The goal is to improve quality of life for both the patient and the family.  We discussed a brief life review of the patient, along with her functional and nutritional status. She states she lives at home with her husband of 58 years. She has 2 sons and 2 grandchildren. She is originally from [place], [place]. She has a dog named Chicco who she loves and misses dearly. She shares with me her story of meeting her husband while he was in  the [place] up in [place]. She enjoys daily walks, spending time with family, and gardening.   Prior to admission patient reports she was independent in all ADLs. She states she walks 1-2 miles daily and continues to drive. She goes biweekly and gets her nails done. She ambulates without an assistive device however she does have a walker with a seat at the request of her doctors. She reports she rarely uses it but may begin once discharged for more stability. Appetite is ok, but has decreased over the years. She no longer eats 3 meals a day, states mainly 2 meals with snacks.   We discussed Her current illness and what it means in the larger context of Her on-going co-morbidities. With specific discussions regarding her falls, enteritis, respiratory failure, CKD, and overall functional state. Natural disease trajectory and expectations at EOL were discussed. Patient verbalizes awareness of her illness and co-morbidities. She reports she follows up with all of her medical providers regularly with no concerns. She states "I have been sick with several hospitalizations over the past month, and it is just taking me a little longer to bounce back. But I am finally feeling like myself again. No pain or discomfort. I feel better when I walk everyday and I am ready to get back to it!"   I discuss with patient her comment with PT on yesterday and also noted comment in the past per psychiatry note from previous admission. Patient states "I am not going to harm myself and I don't want to die unless God calls me home. I say that when I am not feeling well or in pain.  It is a figure of speech!" She states yesterday she was feeling bad, not in pain but weak and tired as she did not sleep well the night before. She insist that she wishes to live and get better if that is God's plan and doesn't want to die prematurely or to harm herself.   I attempted to elicit values and goals of care important to the patient.    The  difference between aggressive medical intervention and comfort care was considered in light of the patient's goals of care. She wishes to continue with her current plan of care. She is hopeful she can return home today or tomorrow. She continues to state, she is not going to get better until she returns home and get back into her routine.   She reports she was initially consider SNF for rehab, however after further thinking about it, speaking with staff, and watching the news she wishes to go home and have in home health due to her fear of contracting the COVID virus in a facility. She states she is fearful with so many facilities having large quantity of positive patients and staff.   Patient reports she has an advanced directives and her husband, Seleen Walter is her medical decision maker. She confirms wishes for DNR/DNI. Concepts specific to artifical feeding and hydration, dialysis, and rehospitalization were considered and discussed. Patient states she DOES NOT want any forms of artificial feeding or  dialysis in the event it is recommended.  She states "when it gets to that part then I really do want to just be able to die! Just keep me comfortable until God takes me home!"   Hospice and Palliative Care services outpatient were explained and offered. Patient verbalized understanding and awareness of both palliative and hospice's goals and philosophy of care. She is requesting outpatient palliative support at discharge.   Questions and concerns were addressed. The family was encouraged to call with questions or concerns.  PMT will continue to support holistically.   SOCIAL HISTORY:     reports that she has never smoked. She has never used smokeless tobacco. She reports current alcohol use. She reports that she does not use drugs.  CODE STATUS: DNR  ADVANCE DIRECTIVES: Crissie Figures (husband/POA)   SYMPTOM MANAGEMENT: per attending   Palliative Prophylaxis:   Aspiration, Delirium Protocol  and Frequent Pain Assessment  PSYCHO-SOCIAL/SPIRITUAL:  Support System: Family   Desire for further Chaplaincy support:NO   Additional Recommendations (Limitations, Scope, Preferences):  Full Scope Treatment, No Artificial Feeding and No Hemodialysis   PAST MEDICAL HISTORY: Past Medical History:  Diagnosis Date  . Allergic rhinitis   . Anemia   . Breast CA ([place])    surgery, chemo, XRT; had peripheral neuropathy (imbalance at times) after chemo  . Cellulitis    Left arm, recurrent   . Cellulitis LEFT arm recurrent 12/08/2011  . Chronic renal disease, stage IV ([facility])    Dr. Moshe Cipro  . Depression   . Fatigue   . GERD (gastroesophageal reflux disease)    gastritis, EGD 02/2007  . Gout   . Hyperlipidemia   . Hypertension   . Macular degeneration 2011  . Normal cardiac stress test 12/2014  . Osteoarthritis   . Renal insufficiency     PAST SURGICAL HISTORY:  Past Surgical History:  Procedure Laterality Date  . cataracts bilaterally  1 -2012  . LUNG BIOPSY  1999   neg  . MASTECTOMY Left 1999   and lymphnodes  ALLERGIES:  is allergic to codeine; morphine; oxycodone-acetaminophen; sulfonamide derivatives; and tramadol hcl.   MEDICATIONS:  Current Facility-Administered Medications  Medication Dose Route Frequency Provider Last Rate Last Dose  . acetaminophen (TYLENOL) tablet 650 mg  650 mg Oral Q6H PRN Ivor Costa, MD       Or  . acetaminophen (TYLENOL) suppository 650 mg  650 mg Rectal Q6H PRN Ivor Costa, MD      . albuterol (PROVENTIL) (2.5 MG/3ML) 0.083% nebulizer solution 2.5 mg  2.5 mg Nebulization Q4H PRN Ivor Costa, MD      . allopurinol (ZYLOPRIM) tablet 100 mg  100 mg Oral Daily Norins, Heinz Knuckles, MD   100 mg at 04/12/19 1150  . amLODipine (NORVASC) tablet 10 mg  10 mg Oral Daily Norins, Heinz Knuckles, MD   10 mg at 04/12/19 1150  . atorvastatin (LIPITOR) tablet 20 mg  20 mg Oral Daily Norins, Heinz Knuckles, MD   20 mg at 04/12/19 1147  . calcium-vitamin D (OSCAL  WITH D) 500-200 MG-UNIT per tablet 1 tablet  1 tablet Oral Daily Ivor Costa, MD   1 tablet at 04/12/19 1149  . dextromethorphan-guaiFENesin (MUCINEX DM) 30-600 MG per 12 hr tablet 1 tablet  1 tablet Oral BID PRN Ivor Costa, MD      . gabapentin (NEURONTIN) capsule 200 mg  200 mg Oral QHS Norins, Heinz Knuckles, MD   200 mg at 04/11/19 2152  . hydrALAZINE (APRESOLINE) injection 5 mg  5 mg Intravenous Q2H PRN Ivor Costa, MD      . hydrOXYzine (ATARAX/VISTARIL) tablet 10 mg  10 mg Oral TID PRN Ivor Costa, MD   10 mg at 04/10/19 2311  . loperamide (IMODIUM) capsule 2 mg  2 mg Oral QID PRN Ivor Costa, MD      . metoprolol tartrate (LOPRESSOR) tablet 25 mg  25 mg Oral BID Norins, Heinz Knuckles, MD   25 mg at 04/12/19 1150  . ondansetron (ZOFRAN) tablet 4 mg  4 mg Oral TID WC Norins, Heinz Knuckles, MD   4 mg at 04/12/19 0852  . ondansetron (ZOFRAN-ODT) disintegrating tablet 4 mg  4 mg Oral Q8H PRN Norins, Heinz Knuckles, MD      . pantoprazole (PROTONIX) EC tablet 40 mg  40 mg Oral BID Ivor Costa, MD   40 mg at 04/12/19 1150  . piperacillin-tazobactam (ZOSYN) IVPB 2.25 g  2.25 g Intravenous Cleophas Dunker, MD 100 mL/hr at 04/12/19 1147 2.25 g at 04/12/19 1147  . vitamin B-12 (CYANOCOBALAMIN) tablet 1,000 mcg  1,000 mcg Oral Daily Norins, Heinz Knuckles, MD   1,000 mcg at 04/12/19 1148  . vitamin C (ASCORBIC ACID) tablet 500 mg  500 mg Oral Daily Ivor Costa, MD   500 mg at 04/12/19 1148    VITAL SIGNS: BP (!) 128/49 (BP Location: Right Leg)   Pulse 75   Temp 98.9 F (37.2 C) (Oral)   Resp 16   Ht _0  (1.6 m)   Wt 59 kg   SpO2 97%   BMI 23.03 kg/m  Filed Weights   04/07/19 2021  Weight: 59 kg    Estimated body mass index is 23.03 kg/m as calculated from the following:   Height as of this encounter: _1  (1.6 m).   Weight as of this encounter: 59 kg.  LABS: CBC:    Component Value Date/Time   WBC 5.2 04/12/2019 0522   HGB 8.4 (L) 04/12/2019 0522   HGB 11.1 (L) 01/30/2009 1350   HCT  25.8 (L) 04/12/2019  0522   HCT 33.3 (L) 01/30/2009 1350   PLT 358 04/12/2019 0522   PLT 180 01/30/2009 1350   Comprehensive Metabolic Panel:    Component Value Date/Time   NA 139 04/12/2019 0522   NA 139 01/20/2015   K 3.8 04/12/2019 0522   CO2 31 04/12/2019 0522   BUN 32 (H) 04/12/2019 0522   BUN 39 (A) 01/20/2015   CREATININE 3.02 (H) 04/12/2019 0522   ALBUMIN 2.0 (L) 04/11/2019 0207     Review of Systems  Constitutional: Positive for fatigue.  Neurological: Positive for weakness.  All other systems reviewed and are negative.  Physical Exam General: NAD, chronically-ill appearing, thin Cardiovascular: regular rate and rhythm Pulmonary: clear ant fields, diminished bases Abdomen: soft, nontender, + bowel sounds Extremities: no edema, no joint deformities Skin: no rashes, left arm lymphedema, left mastectomy  Neurological: Weakness, alert & oriented x3    Prognosis: Guarded in the setting of enteritis, diabetes, anemia, hypertension, generalized weakness, falls, acute hypoxic respiratory failure, CKD stage IV, dCHF, hypokalemia.   Discharge Planning:  Home with Palliative Services and home health   Recommendations:  DNR/DNI-as confirmed by patient  Continue current plan of care with no escalation.   Patient remains hopeful for improvement and to discharge home with home health and outpatient palliative support.   Patient wishes to continue with care outpatient. States previous statement was "a figure of speech because I was not feeling well!"   PMT will continue to support and follow as needed.   Palliative Performance Scale: PPS 50%              Patient  expressed understanding and was in agreement with this plan.   Thank you for allowing the Palliative Medicine Team to assist in the care of this patient.  Time In: 1115 Time Out: 1225 Time Total: 70 min.   Visit consisted of counseling and education dealing with the complex and emotionally intense issues of symptom  management and palliative care in the setting of serious and potentially life-threatening illness.Greater than 50%  of this time was spent counseling and coordinating care related to the above assessment and plan.  Signed by:  Alda Lea, AGPCNP-BC Palliative Medicine Team  Phone: [phone] Fax: [phone] Pager: [phone] Amion: Bjorn Pippin

## 2019-04-12 NOTE — Care Management Important Message (Signed)
Important Message  Patient Details  Name: Samantha Mckenzie MRN: [id number] Date of Birth: [birth year]   Medicare Important Message Given:  Yes     Memory Argue 04/12/2019, 3:22 PM

## 2019-04-12 NOTE — Telephone Encounter (Signed)
FYI they received an order and will be going out to pt's home.

## 2019-04-13 ENCOUNTER — Other Ambulatory Visit: Payer: Medicare Other | Admitting: Licensed Clinical Social Worker

## 2019-04-13 ENCOUNTER — Other Ambulatory Visit: Payer: Self-pay

## 2019-04-13 DIAGNOSIS — Z515 Encounter for palliative care: Secondary | ICD-10-CM

## 2019-04-13 NOTE — Progress Notes (Signed)
COMMUNITY PALLIATIVE CARE SW NOTE  PATIENT NAME: Samantha Mckenzie DOB: 1936-06-02 MRN: [id number]  PRIMARY CARE PROVIDER: Colon Branch, MD  RESPONSIBLE PARTY:  Acct ID - Guarantor Home Phone Work Phone Relationship Acct Type  [id number] EMMALEIGH, [phone]  Self P/F     [place], [place], [place] [address]   Due to the COVID-19 crisis, this virtual check-in visit was done via telephone from my office and it was initiated and consent given by thispatientand or family.   PLAN OF CARE and INTERVENTIONS:             1. GOALS OF CARE/ ADVANCE CARE PLANNING:  Goal is for patient to remain at home with her husband. 2. SOCIAL/EMOTIONAL/SPIRITUAL ASSESSMENT/ INTERVENTIONS:  SW left a vm with patient's husband, Shalayne Leach, Sr.  SW then conducted a virtual check-in visit with patient's son, [name].  Informed him of the benefits of the Palliative Care/[facility] program and he stated he understood.  Patient has another son, Barbee Cough said his father was very hard of hearing.  He felt he would be open to a meeting with the Palliative Care team. 3. PATIENT/CAREGIVER EDUCATION/ COPING:  SW provided extensive education regarding Palliative Care. 4. PERSONAL EMERGENCY PLAN:  Family will contact EMS. 5. COMMUNITY RESOURCES COORDINATION/ HEALTH CARE NAVIGATION:  Home care was ordered from the hospital. 6. FINANCIAL/LEGAL CONCERNS/INTERVENTIONS:  None identified by son.     SOCIAL HX:  Social History   Tobacco Use  . Smoking status: Never Smoker  . Smokeless tobacco: Never Used  Substance Use Topics  . Alcohol use: Yes    Alcohol/week: 0.0 standard drinks    Comment: rarely    CODE STATUS:   Code Status: Prior  ADVANCED DIRECTIVES: N MOST FORM COMPLETE:  N HOSPICE EDUCATION PROVIDED: Y Duration of visit and documentation:  45 minutes.      Creola Corn Dupree Givler, LCSW

## 2019-04-14 DIAGNOSIS — F4321 Adjustment disorder with depressed mood: Secondary | ICD-10-CM | POA: Diagnosis not present

## 2019-04-14 DIAGNOSIS — N184 Chronic kidney disease, stage 4 (severe): Secondary | ICD-10-CM | POA: Diagnosis not present

## 2019-04-14 DIAGNOSIS — Z8701 Personal history of pneumonia (recurrent): Secondary | ICD-10-CM | POA: Diagnosis not present

## 2019-04-14 DIAGNOSIS — E1142 Type 2 diabetes mellitus with diabetic polyneuropathy: Secondary | ICD-10-CM | POA: Diagnosis not present

## 2019-04-14 DIAGNOSIS — E1122 Type 2 diabetes mellitus with diabetic chronic kidney disease: Secondary | ICD-10-CM | POA: Diagnosis not present

## 2019-04-14 DIAGNOSIS — I129 Hypertensive chronic kidney disease with stage 1 through stage 4 chronic kidney disease, or unspecified chronic kidney disease: Secondary | ICD-10-CM | POA: Diagnosis not present

## 2019-04-16 ENCOUNTER — Ambulatory Visit (HOSPITAL_BASED_OUTPATIENT_CLINIC_OR_DEPARTMENT_OTHER)
Admission: RE | Admit: 2019-04-16 | Discharge: 2019-04-16 | Disposition: A | Discharge status: Home / Self Care | Payer: Medicare Other | Source: Ambulatory Visit | Attending: Internal Medicine | Admitting: Internal Medicine

## 2019-04-16 ENCOUNTER — Telehealth: Payer: Self-pay | Admitting: Internal Medicine

## 2019-04-16 ENCOUNTER — Telehealth: Payer: Self-pay | Admitting: Emergency Medicine

## 2019-04-16 ENCOUNTER — Other Ambulatory Visit: Payer: Self-pay

## 2019-04-16 ENCOUNTER — Encounter: Payer: Self-pay | Admitting: Internal Medicine

## 2019-04-16 ENCOUNTER — Ambulatory Visit (INDEPENDENT_AMBULATORY_CARE_PROVIDER_SITE_OTHER): Payer: Medicare Other | Admitting: Internal Medicine

## 2019-04-16 VITALS — BP 125/46 | HR 72 | Temp 97.9°F | Resp 16 | Ht 62.0 in | Wt 133.1 lb

## 2019-04-16 DIAGNOSIS — R195 Other fecal abnormalities: Secondary | ICD-10-CM

## 2019-04-16 DIAGNOSIS — D649 Anemia, unspecified: Secondary | ICD-10-CM | POA: Diagnosis not present

## 2019-04-16 DIAGNOSIS — R627 Adult failure to thrive: Secondary | ICD-10-CM

## 2019-04-16 DIAGNOSIS — I10 Essential (primary) hypertension: Secondary | ICD-10-CM

## 2019-04-16 DIAGNOSIS — J181 Lobar pneumonia, unspecified organism: Secondary | ICD-10-CM

## 2019-04-16 DIAGNOSIS — F329 Major depressive disorder, single episode, unspecified: Secondary | ICD-10-CM | POA: Diagnosis not present

## 2019-04-16 DIAGNOSIS — R197 Diarrhea, unspecified: Secondary | ICD-10-CM

## 2019-04-16 DIAGNOSIS — R531 Weakness: Secondary | ICD-10-CM | POA: Diagnosis not present

## 2019-04-16 DIAGNOSIS — R05 Cough: Secondary | ICD-10-CM | POA: Diagnosis not present

## 2019-04-16 DIAGNOSIS — Z4802 Encounter for removal of sutures: Secondary | ICD-10-CM

## 2019-04-16 DIAGNOSIS — F32A Depression, unspecified: Secondary | ICD-10-CM

## 2019-04-16 DIAGNOSIS — J189 Pneumonia, unspecified organism: Secondary | ICD-10-CM

## 2019-04-16 LAB — CBC WITH DIFFERENTIAL/PLATELET
Basophils Absolute: 0 10*3/uL (ref 0.0–0.1)
Basophils Relative: 0.4 % (ref 0.0–3.0)
Eosinophils Absolute: 0 10*3/uL (ref 0.0–0.7)
Eosinophils Relative: 0.1 % (ref 0.0–5.0)
HCT: 30.7 % — ABNORMAL LOW (ref 36.0–46.0)
Hemoglobin: 10 g/dL — ABNORMAL LOW (ref 12.0–15.0)
Lymphocytes Relative: 7.7 % — ABNORMAL LOW (ref 12.0–46.0)
Lymphs Abs: 0.6 10*3/uL — ABNORMAL LOW (ref 0.7–4.0)
MCHC: 32.7 g/dL (ref 30.0–36.0)
MCV: 89 fl (ref 78.0–100.0)
Monocytes Absolute: 0.3 10*3/uL (ref 0.1–1.0)
Monocytes Relative: 4.2 % (ref 3.0–12.0)
Neutro Abs: 7.1 10*3/uL (ref 1.4–7.7)
Neutrophils Relative %: 87.6 % — ABNORMAL HIGH (ref 43.0–77.0)
Platelets: 513 10*3/uL — ABNORMAL HIGH (ref 150.0–400.0)
RBC: 3.45 Mil/uL — ABNORMAL LOW (ref 3.87–5.11)
RDW: 17.7 % — ABNORMAL HIGH (ref 11.5–15.5)
WBC: 8.1 10*3/uL (ref 4.0–10.5)

## 2019-04-16 LAB — COMPREHENSIVE METABOLIC PANEL
ALT: 17 U/L (ref 0–35)
AST: 15 U/L (ref 0–37)
Albumin: 3.1 g/dL — ABNORMAL LOW (ref 3.5–5.2)
Alkaline Phosphatase: 64 U/L (ref 39–117)
BUN: 45 mg/dL — ABNORMAL HIGH (ref 6–23)
CO2: 26 mEq/L (ref 19–32)
Calcium: 7.5 mg/dL — ABNORMAL LOW (ref 8.4–10.5)
Chloride: 93 mEq/L — ABNORMAL LOW (ref 96–112)
Creatinine, Ser: 3.11 mg/dL — ABNORMAL HIGH (ref 0.40–1.20)
GFR: 14.38 mL/min — CL (ref >60.00)
Glucose, Bld: 173 mg/dL — ABNORMAL HIGH (ref 70–99)
Potassium: 3.9 mEq/L (ref 3.5–5.1)
Sodium: 133 mEq/L — ABNORMAL LOW (ref 135–145)
Total Bilirubin: 0.5 mg/dL (ref 0.2–1.2)
Total Protein: 6.8 g/dL (ref 6.0–8.3)

## 2019-04-16 MED ORDER — ESCITALOPRAM OXALATE 5 MG PO TABS: ORAL_TABLET | ORAL | 0 refills | Status: DC

## 2019-04-16 NOTE — Progress Notes (Signed)
Pre visit review using our clinic review tool, if applicable. No additional management support is needed unless otherwise documented below in the visit note. 

## 2019-04-16 NOTE — Patient Instructions (Addendum)
    GO TO THE FRONT DESK Schedule your next appointment for checkup in 4 weeks  Get a chest XR   Start Lexapro 5 mg: 1 tablet daily for 1 week, then 2 tablets daily  Drink plenty fluids  Start taking probiotic such as  ALIGN  to help you with the diarrhea  If the diarrhea is not improving in the next 10 days please let me know  Please consider talk with a counselor, the number is [phone]  [phone]

## 2019-04-16 NOTE — Telephone Encounter (Signed)
Not a complete telephone number.

## 2019-04-16 NOTE — Telephone Encounter (Signed)
"  CRITICAL VALUE STICKER  CRITICAL VALUE:GFR 14.38  RECEIVER (on-site recipient of call):Brandyn Lowrey P  DATE & TIME NOTIFIED: 4:12  MESSENGER (representative from lab):HOPE  MD NOTIFIED: PAZ  TIME OF NOTIFICATION:4:15  RESPONSE:

## 2019-04-16 NOTE — Telephone Encounter (Signed)
Caller/Agency: [facility] Number: [phone] ok to leave verbal on VM Requesting OT/PT/Skilled Nursing/Social Work/Speech Therapy: Social Work Frequency: 2 times for a month

## 2019-04-16 NOTE — Assessment & Plan Note (Signed)
Scalp laceration: 2 staples removed without problem.  Recommend local care. Enteritis: Recently admitted to the hospital with enteritis, CT abdomen w/changes consistent with enteritis, and possible microperforation, surgery was consulted, she was recommended medical management and is now about to finish a round of Augmentin. Abdominal pain is gone, no fever, she still have some nausea anddiarrhea w/  brownish/reddish-looking stools. Stools today are  brown, no frank blood but hemocult +. Plan: CMP, CBC.  Consider contact GI w/ results  OTC probiotics.  Call if not better, Depression: Ongoing problem, no suicidal ideas, etiology not completely clear, seems like her relationship with her husband has not been the best for long time.  I counseled her the best I could, recommend to speak with a counselor, numbers provided. She was prescribed mirtazapine 04/03/2019, after that she had at least one fall, I wonder if there is a relationship.  She does need help, will start lexapro.  See instructions Anemia: Checking labs today. Community-acquired pneumonia, history of respiratory failure: Check a chest x-ray, currently saturating good without oxygen supplements RTC 4 weeks

## 2019-04-16 NOTE — Progress Notes (Signed)
Subjective:    Patient ID: Samantha Mckenzie, female    DOB: 1936/04/29, 83 y.o.   MRN: [id number]  DOS:  04/16/2019 Type of visit - description: f/u  Last seen 04/03/2019 at this office for a hospital follow-up due to community-acquired pneumonia.  Since then, she went to the ER 04/04/2019, had a fall, had a laceration repair, CT head with no acute findings.  Subsequently, she was admitted to the hospital on 04/07/2019 and discharged 04/12/2019. She was admitted with enteritis, weakness, iron deficiency anemia. Abd CT: Focal small bowel inflammatory changes consistent with enteritis with possible  microperforation the patient was treated with antibiotics, discharged on Augmentin, surgery team recommended nonoperative management.  Chronic anemia: Heme positive stools in the setting of enteritis per discharge summary.  She received a transfusion 04/08/2019.  Acute respiratory failure, discharged on oxygen due to O2 sat of 87% ambulating.  Generalized weakness: Patient declined at SNF  Kidney injury, discharge creatinine was 3.02  CHF: Lasix decreased due to poor oral intake.  Palliative care consulted, she is DNR  Review of Systems  Since she left the hospital, she is taking prednisone and a antibiotic. Enteritis sxs improved.  Abdominal pain is resolved. She still has some nausea on vomited a couple of days ago.  Stools are still loose, brown/reddish in color but no frank blood. Appetite is still decreased.  She continue with depression, "I just like Jesus to take me".  But denies any suicidal ideas. I  asked about the origin of her depression, she is not sure, she mentioned her husband, apparently the relationship is not the best however at the same time she feels completely safe at home. She is mostly upset about her generalize weakness  Also  affected by current social turmoil in [place]    No further falls  Head laceration: Reports no pain, discharge or bleeding.  Continue  feeling weak, no better than when she was released from the hospital   Past Medical History:  Diagnosis Date  . Allergic rhinitis   . Anemia   . Breast CA ([place])    surgery, chemo, XRT; had peripheral neuropathy (imbalance at times) after chemo  . Cellulitis    Left arm, recurrent   . Cellulitis LEFT arm recurrent 12/08/2011  . Chronic renal disease, stage IV (HCC)    Dr. Moshe Cipro  . Depression   . Fatigue   . GERD (gastroesophageal reflux disease)    gastritis, EGD 02/2007  . Gout   . Hyperlipidemia   . Hypertension   . Macular degeneration 2011  . Normal cardiac stress test 12/2014  . Osteoarthritis   . Renal insufficiency     Past Surgical History:  Procedure Laterality Date  . cataracts bilaterally  1 -2012  . LUNG BIOPSY  1999   neg  . MASTECTOMY Left 1999   and lymphnodes     Social History   Socioeconomic History  . Marital status: Married    Spouse name: Not on file  . Number of children: 2  . Years of education: Not on file  . Highest education level: Not on file  Occupational History  . Occupation: retired     Fish farm manager: RETIRED  Social Needs  . Financial resource strain: Not on file  . Food insecurity    Worry: Not on file    Inability: Not on file  . Transportation needs    Medical: Not on file    Non-medical: Not on file  Tobacco Use  .  Smoking status: Never Smoker  . Smokeless tobacco: Never Used  Substance and Sexual Activity  . Alcohol use: Yes    Alcohol/week: 0.0 standard drinks    Comment: rarely  . Drug use: No  . Sexual activity: Not Currently  Lifestyle  . Physical activity    Days per week: Not on file    Minutes per session: Not on file  . Stress: Not on file  Relationships  . Social Herbalist on phone: Not on file    Gets together: Not on file    Attends religious service: Not on file    Active member of club or organization: Not on file    Attends meetings of clubs or organizations: Not on file     Relationship status: Not on file  . Intimate partner violence    Fear of current or ex partner: Not on file    Emotionally abused: Not on file    Physically abused: Not on file    Forced sexual activity: Not on file  Other Topics Concern  . Not on file  Social History Narrative   Lives w/ husband , 2 children in [place], 2 [place] as of 04/16/2019      Reactions   Codeine Nausea And Vomiting   Morphine Nausea And Vomiting   Oxycodone-acetaminophen Nausea And Vomiting   Sulfonamide Derivatives Nausea And Vomiting   Tramadol Hcl Nausea And Vomiting      Medication List       Accurate as of April 16, 2019  8:59 PM. If you have any questions, ask your nurse or doctor.        acetaminophen 325 MG tablet Commonly known as: TYLENOL Take 2 tablets (650 mg total) by mouth every 6 (six) hours as needed for mild pain, fever or headache (or Fever >/= 101).   albuterol (2.5 MG/3ML) 0.083% nebulizer solution Commonly known as: PROVENTIL Take 3 mLs (2.5 mg total) by nebulization every 4 (four) hours as needed for wheezing or shortness of breath.   allopurinol 100 MG tablet Commonly known as: ZYLOPRIM Take 1 tablet (100 mg total) by mouth daily.   amLODipine 10 MG tablet Commonly known as: NORVASC Take 1 tablet (10 mg total) by mouth daily.   amoxicillin-clavulanate 875-125 MG tablet Commonly known as: Augmentin Take 1 tablet by mouth 2 (two) times daily for 4 days.   aspirin 81 MG tablet Take 1 tablet (81 mg total) by mouth daily with breakfast.   atorvastatin 20 MG tablet Commonly known as: LIPITOR Take 1 tablet (20 mg total) by mouth daily.   CALTRATE 600+D3 PO Take 1 tablet by mouth daily.   CENTRUM SILVER PO Take 1 tablet by mouth daily.   dicyclomine 10 MG capsule Commonly known as: BENTYL Take 2 capsules (20 mg total) by mouth 4 (four) times daily -  before meals and at bedtime.   escitalopram 5 MG tablet Commonly known as: Lexapro 1 tablet daily  for 1 week, then 2 tablets daily Started by: Kathlene November, MD   furosemide 20 MG tablet Commonly known as: LASIX Take 1 tablet (20 mg total) by mouth daily.   hydrOXYzine 10 MG tablet Commonly known as: ATARAX/VISTARIL Take 1 tablet (10 mg total) by mouth 3 (three) times daily as needed for nausea.   loperamide 2 MG tablet Commonly known as: IMODIUM A-D Take 2 mg by mouth 4 (four) times  daily as needed for diarrhea or loose stools.   metoprolol tartrate 25 MG tablet Commonly known as: LOPRESSOR Take 1 tablet (25 mg total) by mouth 2 (two) times daily.   ondansetron 4 MG tablet Commonly known as: ZOFRAN Take 1 tablet (4 mg total) by mouth every 8 (eight) hours as needed for nausea or vomiting.   pantoprazole 40 MG tablet Commonly known as: PROTONIX Take 40 mg by mouth daily.   predniSONE 20 MG tablet Commonly known as: Deltasone Take 1 tablet (20 mg total) by mouth daily with breakfast.   vitamin B-12 1000 MCG tablet Commonly known as: CYANOCOBALAMIN Take 1,000 mcg by mouth daily.   vitamin C 500 MG tablet Commonly known as: ASCORBIC ACID Take 500 mg by mouth daily.   Vitamin D3 25 MCG (1000 UT) Caps Take 1 capsule by mouth daily.           Objective:   Physical Exam BP (!) 125/46 (BP Location: Right Arm, Patient Position: Sitting, Cuff Size: Small)   Pulse 72   Temp 97.9 F (36.6 C) (Oral)   Resp 16   Ht 5' 2"  (1.575 m)   Wt 133 lb 2 oz (60.4 kg)   SpO2 99%   BMI 24.35 kg/m  General:   Well developed, NAD, BMI noted.  HEENT:  Normocephalic . Face symmetric, atraumatic Lungs:  CTA B Normal respiratory effort, no intercostal retractions, no accessory muscle use. Heart: RRR,  no murmur.  no pretibial edema bilaterally  Abdomen:  Not distended, soft, non-tender. No rebound or rigidity.  (Exam was performed while the patient was sitting in the chair, she declined  to get onto the table) DRE: brown stools, some mucus, no frank blood, hemocult + Skin: 2  staples removed from the posterior scalp without problems Neurologic:  alert & oriented X3.  Speech normal, tearful at times during the  visit Psych--  Cognition and judgment appear intact.  Cooperative with normal attention span and concentration.  Behavior appropriate. + depressed appearing.     Assessment      Assessment  DM- neuropathy (paresthesias, nl pinprick exam), CKD HTN Hyperlipidemia: Lipitor intolerant? CAD? States see Dr Terrence Dupont, no OV  records. (-) stress test 12-2014 CKD -- sees nephrology Gout GERD, nl EGD 01-2015  H/o Depression Leg pain-- RLS vs neuropathy, on gabapentin since 2013, good results H/o anemia --- no iron def , Cscope 2012, normal EGD 01-2015 Breast cancer:  --S/p Surgery (L mastectomy), chemotherapy, XRT --Peripheral neuropathy felt to be due to chemotherapy --Recurrent left arm cellulitis DJD  PLAN Scalp laceration: 2 staples removed without problem.  Recommend local care. Enteritis: Recently admitted to the hospital with enteritis, CT abdomen w/changes consistent with enteritis, and possible microperforation, surgery was consulted, she was recommended medical management and is now about to finish a round of Augmentin. Abdominal pain is gone, no fever, she still have some nausea anddiarrhea w/  brownish/reddish-looking stools. Stools today are  brown, no frank blood but hemocult +. Plan: CMP, CBC.  Consider contact GI w/ results  OTC probiotics.  Call if not better, Depression: Ongoing problem, no suicidal ideas, etiology not completely clear, seems like her relationship with her husband has not been the best for long time.  I counseled her the best I could, recommend to speak with a counselor, numbers provided. She was prescribed mirtazapine 04/03/2019, after that she had at least one fall, I wonder if there is a relationship.  She does need help, will start lexapro.  See instructions Anemia: Checking labs today. Community-acquired pneumonia,  history of respiratory failure: Check a chest x-ray, currently saturating good without oxygen supplements RTC 4 weeks  Today, I spent more than 50   min with the patient: >50% of the time counseling regards her multiple medical problems including depression, reviewing the chart, removing 2 staples and listening to her concerns.

## 2019-04-17 ENCOUNTER — Telehealth: Payer: Self-pay | Admitting: Internal Medicine

## 2019-04-17 DIAGNOSIS — Z8701 Personal history of pneumonia (recurrent): Secondary | ICD-10-CM | POA: Diagnosis not present

## 2019-04-17 DIAGNOSIS — E1122 Type 2 diabetes mellitus with diabetic chronic kidney disease: Secondary | ICD-10-CM | POA: Diagnosis not present

## 2019-04-17 DIAGNOSIS — N184 Chronic kidney disease, stage 4 (severe): Secondary | ICD-10-CM | POA: Diagnosis not present

## 2019-04-17 DIAGNOSIS — F4321 Adjustment disorder with depressed mood: Secondary | ICD-10-CM | POA: Diagnosis not present

## 2019-04-17 DIAGNOSIS — E1142 Type 2 diabetes mellitus with diabetic polyneuropathy: Secondary | ICD-10-CM | POA: Diagnosis not present

## 2019-04-17 DIAGNOSIS — I129 Hypertensive chronic kidney disease with stage 1 through stage 4 chronic kidney disease, or unspecified chronic kidney disease: Secondary | ICD-10-CM | POA: Diagnosis not present

## 2019-04-17 NOTE — Telephone Encounter (Signed)
Palliative care referral placed. GI referral placed.

## 2019-04-17 NOTE — Telephone Encounter (Signed)
Spoke w/ Donita- verbal orders given.

## 2019-04-17 NOTE — Telephone Encounter (Signed)
Please advise 

## 2019-04-17 NOTE — Telephone Encounter (Signed)
Donita called and stated that she was with patient today and pt is very weak/light headed/dizzy. Vitals are stable. Pt reported that she felt like she was going to fall. Pt states that she felt like all she wanted to do is sleep.   Donita would like verbals for Nursing  2x2 1x4   Please advise

## 2019-04-17 NOTE — Telephone Encounter (Signed)
Please arrange  palliative care to see the patient at home.  They met her at the hospital already.  DX: Failure to thrive Arrange GI visit, Dr. Henrene Pastor.  Diagnosis: Diarrhea, persistent + Hemoccults. ==== I spoke with the patient today, she is feeling about the same, has not been able to pick up Lexapro just yet because she does not drive. Labs continue showing decreased kidney function, fortunately hemoglobin is stable. We agreed on a palliative care and GI referral. Follow-up with me is in 4 weeks however she will reach out sooner if needed .

## 2019-04-17 NOTE — Telephone Encounter (Signed)
Agree, thank you

## 2019-04-19 ENCOUNTER — Telehealth: Payer: Self-pay | Admitting: Licensed Clinical Social Worker

## 2019-04-19 NOTE — Telephone Encounter (Signed)
CB#[phone]

## 2019-04-19 NOTE — Telephone Encounter (Signed)
Spoke w/ Jenny Reichmann, verbal orders given.

## 2019-04-19 NOTE — Telephone Encounter (Signed)
Palliative Care SW phoned patient's husband, [name], who had returned SW call from last week.  SW left a vm requesting a visit.

## 2019-04-20 ENCOUNTER — Emergency Department (HOSPITAL_COMMUNITY): Payer: Medicare Other

## 2019-04-20 ENCOUNTER — Telehealth: Payer: Self-pay | Admitting: Licensed Clinical Social Worker

## 2019-04-20 ENCOUNTER — Inpatient Hospital Stay (HOSPITAL_COMMUNITY)
Admission: EM | Admit: 2019-04-20 | Discharge: 2019-04-24 | DRG: 386 | Disposition: A | Discharge status: Skilled Nursing Facility | Payer: Medicare Other | Attending: Internal Medicine | Admitting: Internal Medicine

## 2019-04-20 ENCOUNTER — Other Ambulatory Visit: Payer: Self-pay

## 2019-04-20 ENCOUNTER — Encounter (HOSPITAL_COMMUNITY): Payer: Self-pay

## 2019-04-20 DIAGNOSIS — Z9221 Personal history of antineoplastic chemotherapy: Secondary | ICD-10-CM

## 2019-04-20 DIAGNOSIS — K50011 Crohn's disease of small intestine with rectal bleeding: Secondary | ICD-10-CM | POA: Diagnosis present

## 2019-04-20 DIAGNOSIS — F329 Major depressive disorder, single episode, unspecified: Secondary | ICD-10-CM | POA: Diagnosis present

## 2019-04-20 DIAGNOSIS — E1122 Type 2 diabetes mellitus with diabetic chronic kidney disease: Secondary | ICD-10-CM | POA: Diagnosis present

## 2019-04-20 DIAGNOSIS — I130 Hypertensive heart and chronic kidney disease with heart failure and stage 1 through stage 4 chronic kidney disease, or unspecified chronic kidney disease: Secondary | ICD-10-CM | POA: Diagnosis present

## 2019-04-20 DIAGNOSIS — K5000 Crohn's disease of small intestine without complications: Secondary | ICD-10-CM | POA: Diagnosis not present

## 2019-04-20 DIAGNOSIS — Z803 Family history of malignant neoplasm of breast: Secondary | ICD-10-CM

## 2019-04-20 DIAGNOSIS — M109 Gout, unspecified: Secondary | ICD-10-CM | POA: Diagnosis present

## 2019-04-20 DIAGNOSIS — E860 Dehydration: Secondary | ICD-10-CM | POA: Diagnosis present

## 2019-04-20 DIAGNOSIS — K50019 Crohn's disease of small intestine with unspecified complications: Secondary | ICD-10-CM | POA: Diagnosis present

## 2019-04-20 DIAGNOSIS — K529 Noninfective gastroenteritis and colitis, unspecified: Secondary | ICD-10-CM | POA: Diagnosis not present

## 2019-04-20 DIAGNOSIS — Z923 Personal history of irradiation: Secondary | ICD-10-CM

## 2019-04-20 DIAGNOSIS — N184 Chronic kidney disease, stage 4 (severe): Secondary | ICD-10-CM | POA: Diagnosis present

## 2019-04-20 DIAGNOSIS — I959 Hypotension, unspecified: Secondary | ICD-10-CM | POA: Diagnosis not present

## 2019-04-20 DIAGNOSIS — D539 Nutritional anemia, unspecified: Secondary | ICD-10-CM | POA: Diagnosis present

## 2019-04-20 DIAGNOSIS — R0902 Hypoxemia: Secondary | ICD-10-CM | POA: Diagnosis not present

## 2019-04-20 DIAGNOSIS — K5730 Diverticulosis of large intestine without perforation or abscess without bleeding: Secondary | ICD-10-CM | POA: Diagnosis not present

## 2019-04-20 DIAGNOSIS — I10 Essential (primary) hypertension: Secondary | ICD-10-CM | POA: Diagnosis present

## 2019-04-20 DIAGNOSIS — R109 Unspecified abdominal pain: Secondary | ICD-10-CM | POA: Diagnosis not present

## 2019-04-20 DIAGNOSIS — Z7982 Long term (current) use of aspirin: Secondary | ICD-10-CM

## 2019-04-20 DIAGNOSIS — D631 Anemia in chronic kidney disease: Secondary | ICD-10-CM | POA: Diagnosis present

## 2019-04-20 DIAGNOSIS — E785 Hyperlipidemia, unspecified: Secondary | ICD-10-CM | POA: Diagnosis present

## 2019-04-20 DIAGNOSIS — D649 Anemia, unspecified: Secondary | ICD-10-CM | POA: Diagnosis not present

## 2019-04-20 DIAGNOSIS — I5032 Chronic diastolic (congestive) heart failure: Secondary | ICD-10-CM | POA: Diagnosis not present

## 2019-04-20 DIAGNOSIS — Z1159 Encounter for screening for other viral diseases: Secondary | ICD-10-CM | POA: Diagnosis not present

## 2019-04-20 DIAGNOSIS — R509 Fever, unspecified: Secondary | ICD-10-CM | POA: Diagnosis not present

## 2019-04-20 DIAGNOSIS — Z66 Do not resuscitate: Secondary | ICD-10-CM | POA: Diagnosis present

## 2019-04-20 DIAGNOSIS — Z20828 Contact with and (suspected) exposure to other viral communicable diseases: Secondary | ICD-10-CM | POA: Diagnosis not present

## 2019-04-20 DIAGNOSIS — E871 Hypo-osmolality and hyponatremia: Secondary | ICD-10-CM | POA: Diagnosis present

## 2019-04-20 DIAGNOSIS — E118 Type 2 diabetes mellitus with unspecified complications: Secondary | ICD-10-CM | POA: Diagnosis present

## 2019-04-20 DIAGNOSIS — F32A Depression, unspecified: Secondary | ICD-10-CM | POA: Diagnosis present

## 2019-04-20 DIAGNOSIS — K5289 Other specified noninfective gastroenteritis and colitis: Secondary | ICD-10-CM | POA: Diagnosis not present

## 2019-04-20 DIAGNOSIS — R1030 Lower abdominal pain, unspecified: Secondary | ICD-10-CM | POA: Diagnosis not present

## 2019-04-20 DIAGNOSIS — K6389 Other specified diseases of intestine: Secondary | ICD-10-CM | POA: Diagnosis not present

## 2019-04-20 DIAGNOSIS — R42 Dizziness and giddiness: Secondary | ICD-10-CM | POA: Diagnosis not present

## 2019-04-20 DIAGNOSIS — R1084 Generalized abdominal pain: Secondary | ICD-10-CM | POA: Diagnosis not present

## 2019-04-20 DIAGNOSIS — Z853 Personal history of malignant neoplasm of breast: Secondary | ICD-10-CM

## 2019-04-20 DIAGNOSIS — Z79899 Other long term (current) drug therapy: Secondary | ICD-10-CM

## 2019-04-20 DIAGNOSIS — K219 Gastro-esophageal reflux disease without esophagitis: Secondary | ICD-10-CM | POA: Diagnosis present

## 2019-04-20 DIAGNOSIS — Z9012 Acquired absence of left breast and nipple: Secondary | ICD-10-CM

## 2019-04-20 DIAGNOSIS — Z7952 Long term (current) use of systemic steroids: Secondary | ICD-10-CM

## 2019-04-20 LAB — URINALYSIS, ROUTINE W REFLEX MICROSCOPIC
Bilirubin Urine: NEGATIVE
Glucose, UA: NEGATIVE mg/dL
Ketones, ur: NEGATIVE mg/dL
Leukocytes,Ua: NEGATIVE
Nitrite: NEGATIVE
Protein, ur: NEGATIVE mg/dL
Specific Gravity, Urine: 1.005 (ref 1.005–1.030)
pH: 6 (ref 5.0–8.0)

## 2019-04-20 LAB — COMPREHENSIVE METABOLIC PANEL
ALT: 14 U/L (ref 0–44)
AST: 16 U/L (ref 15–41)
Albumin: 2.6 g/dL — ABNORMAL LOW (ref 3.5–5.0)
Alkaline Phosphatase: 56 U/L (ref 38–126)
Anion gap: 15 (ref 5–15)
BUN: 50 mg/dL — ABNORMAL HIGH (ref 8–23)
CO2: 27 mmol/L (ref 22–32)
Calcium: 8.5 mg/dL — ABNORMAL LOW (ref 8.9–10.3)
Chloride: 87 mmol/L — ABNORMAL LOW (ref 98–111)
Creatinine, Ser: 3.07 mg/dL — ABNORMAL HIGH (ref 0.44–1.00)
GFR calc Af Amer: 16 mL/min — ABNORMAL LOW (ref >60)
GFR calc non Af Amer: 14 mL/min — ABNORMAL LOW (ref >60)
Glucose, Bld: 149 mg/dL — ABNORMAL HIGH (ref 70–99)
Potassium: 3.7 mmol/L (ref 3.5–5.1)
Sodium: 129 mmol/L — ABNORMAL LOW (ref 135–145)
Total Bilirubin: 0.6 mg/dL (ref 0.3–1.2)
Total Protein: 6.9 g/dL (ref 6.5–8.1)

## 2019-04-20 LAB — CBC WITH DIFFERENTIAL/PLATELET
Abs Immature Granulocytes: 0.5 10*3/uL — ABNORMAL HIGH (ref 0.00–0.07)
Band Neutrophils: 26 %
Basophils Absolute: 0 10*3/uL (ref 0.0–0.1)
Basophils Relative: 0 %
Eosinophils Absolute: 0.1 10*3/uL (ref 0.0–0.5)
Eosinophils Relative: 1 %
HCT: 28.2 % — ABNORMAL LOW (ref 36.0–46.0)
Hemoglobin: 9.2 g/dL — ABNORMAL LOW (ref 12.0–15.0)
Lymphocytes Relative: 12 %
Lymphs Abs: 0.9 10*3/uL (ref 0.7–4.0)
MCH: 29.7 pg (ref 26.0–34.0)
MCHC: 32.6 g/dL (ref 30.0–36.0)
MCV: 91 fL (ref 80.0–100.0)
Metamyelocytes Relative: 2 %
Monocytes Absolute: 0.2 10*3/uL (ref 0.1–1.0)
Monocytes Relative: 3 %
Myelocytes: 4 %
Neutro Abs: 6 10*3/uL (ref 1.7–7.7)
Neutrophils Relative %: 52 %
Platelets: 404 10*3/uL — ABNORMAL HIGH (ref 150–400)
RBC: 3.1 MIL/uL — ABNORMAL LOW (ref 3.87–5.11)
RDW: 18.5 % — ABNORMAL HIGH (ref 11.5–15.5)
WBC: 7.7 10*3/uL (ref 4.0–10.5)
nRBC: 0 % (ref 0.0–0.2)

## 2019-04-20 LAB — C-REACTIVE PROTEIN: CRP: 17.3 mg/dL — ABNORMAL HIGH (ref <1.0)

## 2019-04-20 LAB — GLUCOSE, CAPILLARY
Glucose-Capillary: 105 mg/dL — ABNORMAL HIGH (ref 70–99)
Glucose-Capillary: 92 mg/dL (ref 70–99)

## 2019-04-20 LAB — LACTIC ACID, PLASMA: Lactic Acid, Venous: 0.8 mmol/L (ref 0.5–1.9)

## 2019-04-20 LAB — SARS CORONAVIRUS 2 BY RT PCR (HOSPITAL ORDER, PERFORMED IN ~~LOC~~ HOSPITAL LAB): SARS Coronavirus 2: NEGATIVE

## 2019-04-20 LAB — LIPASE, BLOOD: Lipase: 31 U/L (ref 11–51)

## 2019-04-20 LAB — SEDIMENTATION RATE: Sed Rate: 130 mm/hr — ABNORMAL HIGH (ref 0–22)

## 2019-04-20 MED ORDER — PIPERACILLIN-TAZOBACTAM IN DEX 2-0.25 GM/50ML IV SOLN
2.2500 g | Freq: Three times a day (TID) | INTRAVENOUS | Status: DC
  Administered 2019-04-20 – 2019-04-24 (×12): 2.25 g via INTRAVENOUS
  Filled 2019-04-20 (×13): qty 50

## 2019-04-20 MED ORDER — HEPARIN SODIUM (PORCINE) 5000 UNIT/ML IJ SOLN
5000.0000 [IU] | Freq: Three times a day (TID) | INTRAMUSCULAR | Status: DC
  Administered 2019-04-20 – 2019-04-24 (×11): 5000 [IU] via SUBCUTANEOUS
  Filled 2019-04-20 (×11): qty 1

## 2019-04-20 MED ORDER — ASPIRIN 81 MG PO TABS: 81.0000 mg | ORAL_TABLET | Freq: Every day | ORAL | Status: DC

## 2019-04-20 MED ORDER — ACETAMINOPHEN 650 MG RE SUPP: 650.0000 mg | Freq: Four times a day (QID) | RECTAL | Status: DC | PRN

## 2019-04-20 MED ORDER — AMLODIPINE BESYLATE 5 MG PO TABS: 10.0000 mg | ORAL_TABLET | Freq: Every day | ORAL | Status: DC

## 2019-04-20 MED ORDER — SODIUM CHLORIDE 0.9 % IV SOLN
2.0000 g | Freq: Once | INTRAVENOUS | Status: AC
  Administered 2019-04-20: 2 g via INTRAVENOUS
  Filled 2019-04-20: qty 20

## 2019-04-20 MED ORDER — ACETAMINOPHEN 325 MG PO TABS: 650.0000 mg | ORAL_TABLET | Freq: Four times a day (QID) | ORAL | Status: DC | PRN

## 2019-04-20 MED ORDER — ATORVASTATIN CALCIUM 20 MG PO TABS: 20.0000 mg | ORAL_TABLET | Freq: Every day | ORAL | Status: DC

## 2019-04-20 MED ORDER — PANTOPRAZOLE SODIUM 40 MG PO TBEC
40.0000 mg | DELAYED_RELEASE_TABLET | Freq: Every day | ORAL | Status: DC
  Administered 2019-04-21 – 2019-04-24 (×4): 40 mg via ORAL
  Filled 2019-04-20 (×5): qty 1

## 2019-04-20 MED ORDER — ONDANSETRON HCL 4 MG PO TABS
4.0000 mg | ORAL_TABLET | Freq: Four times a day (QID) | ORAL | Status: DC | PRN
  Administered 2019-04-22: 4 mg via ORAL
  Filled 2019-04-20: qty 1

## 2019-04-20 MED ORDER — METOPROLOL TARTRATE 25 MG PO TABS: 25.0000 mg | ORAL_TABLET | Freq: Two times a day (BID) | ORAL | Status: DC

## 2019-04-20 MED ORDER — IOHEXOL 300 MG/ML  SOLN
30.0000 mL | Freq: Once | INTRAMUSCULAR | Status: AC | PRN
  Administered 2019-04-20: 06:00:00 30 mL via ORAL

## 2019-04-20 MED ORDER — HYDROXYZINE HCL 10 MG PO TABS
10.0000 mg | ORAL_TABLET | Freq: Three times a day (TID) | ORAL | Status: DC | PRN
  Filled 2019-04-20: qty 1

## 2019-04-20 MED ORDER — ALLOPURINOL 100 MG PO TABS
100.0000 mg | ORAL_TABLET | Freq: Every day | ORAL | Status: DC
  Administered 2019-04-20 – 2019-04-24 (×5): 100 mg via ORAL
  Filled 2019-04-20 (×5): qty 1

## 2019-04-20 MED ORDER — METRONIDAZOLE IN NACL 5-0.79 MG/ML-% IV SOLN
500.0000 mg | Freq: Once | INTRAVENOUS | Status: AC
  Administered 2019-04-20: 500 mg via INTRAVENOUS
  Filled 2019-04-20: qty 100

## 2019-04-20 MED ORDER — INSULIN ASPART 100 UNIT/ML ~~LOC~~ SOLN
0.0000 [IU] | SUBCUTANEOUS | Status: DC
  Administered 2019-04-22: 01:00:00 1 [IU] via SUBCUTANEOUS

## 2019-04-20 MED ORDER — DICYCLOMINE HCL 10 MG PO CAPS
20.0000 mg | ORAL_CAPSULE | Freq: Three times a day (TID) | ORAL | Status: DC
  Administered 2019-04-20 – 2019-04-24 (×13): 20 mg via ORAL
  Filled 2019-04-20 (×13): qty 2

## 2019-04-20 MED ORDER — ACETAMINOPHEN 325 MG PO TABS
650.0000 mg | ORAL_TABLET | Freq: Once | ORAL | Status: DC
  Filled 2019-04-20: qty 2

## 2019-04-20 MED ORDER — SODIUM CHLORIDE 0.9 % IV SOLN
INTRAVENOUS | Status: DC
  Administered 2019-04-20 – 2019-04-23 (×4): via INTRAVENOUS

## 2019-04-20 MED ORDER — ONDANSETRON HCL 4 MG/2ML IJ SOLN
4.0000 mg | Freq: Four times a day (QID) | INTRAMUSCULAR | Status: DC | PRN
  Administered 2019-04-21 – 2019-04-22 (×2): 4 mg via INTRAVENOUS
  Filled 2019-04-20 (×2): qty 2

## 2019-04-20 MED ORDER — ALBUTEROL SULFATE (2.5 MG/3ML) 0.083% IN NEBU: 2.5000 mg | INHALATION_SOLUTION | RESPIRATORY_TRACT | Status: DC | PRN

## 2019-04-20 MED ORDER — ESCITALOPRAM OXALATE 10 MG PO TABS
5.0000 mg | ORAL_TABLET | Freq: Every day | ORAL | Status: DC
  Administered 2019-04-20 – 2019-04-23 (×4): 5 mg via ORAL
  Filled 2019-04-20 (×4): qty 1

## 2019-04-20 NOTE — ED Provider Notes (Signed)
[facility] DEPT Provider Note   CSN: [id number] Arrival date & time: 04/20/19  0441    History   Chief Complaint Chief Complaint  Patient presents with   Weakness    HPI Samantha Mckenzie is a 83 y.o. female.     Patient presents to the emergency department for evaluation of generalized weakness.  Patient reports that she was recently in the hospital with similar issues.  Since going home from the hospital she has started to become weak again.  She has been off balance, having difficulty walking.  She feels like she is going to fall.  She has had a poor appetite and has noticed over the last week or so that she has had increasing abdominal pain again.     Past Medical History:  Diagnosis Date   Allergic rhinitis    Anemia    Breast CA ([place])    surgery, chemo, XRT; had peripheral neuropathy (imbalance at times) after chemo   Cellulitis    Left arm, recurrent    Cellulitis LEFT arm recurrent 12/08/2011   Chronic renal disease, stage IV ([place])    Dr. Moshe Cipro   Depression    Fatigue    GERD (gastroesophageal reflux disease)    gastritis, EGD 02/2007   Gout    Hyperlipidemia    Hypertension    Macular degeneration 2011   Normal cardiac stress test 12/2014   Osteoarthritis    Renal insufficiency     Patient Active Problem List   Diagnosis Date Noted   COPD (chronic obstructive pulmonary disease) ([place]) 04/12/2019   Enteritis 04/07/2019   Chronic diastolic CHF (congestive heart failure) ([place]) 04/07/2019   Hypokalemia 04/07/2019   Hypocalcemia 04/07/2019   Fall 04/07/2019   Occult blood positive stool 04/07/2019   Adjustment disorder with depressed mood    Generalized abdominal pain    Dysphasia 03/21/2019   CAP (community acquired pneumonia) 03/20/2019   Nausea vomiting and diarrhea 03/20/2019   Acute respiratory failure with hypoxia ([place]) 03/20/2019   Acute renal failure superimposed on stage 4  chronic kidney disease ([place]) 03/20/2019   Lactic acidosis 03/20/2019   Diabetic peripheral neuropathy associated with type 2 diabetes mellitus ([place]) 03/20/2019   Hypomagnesemia 06/21/2018   Generalized weakness 06/20/2018   PCP NOTES >>> 07/11/2015   Depression    VITAMIN B12 DEFICIENCY 04/01/2008   GERD 12/19/2007   NEOP, MALIGNANT, FEMALE BREAST NOS 07/07/2007   Osteoarthritis 07/07/2007   Diabetes mellitus type 2 with complications ([place]) 89/21/1939   Hyperlipidemia 02/28/2007   GOUT 02/28/2007   Normocytic anemia 02/28/2007   Essential hypertension 02/28/2007    Past Surgical History:  Procedure Laterality Date   cataracts bilaterally  1 -2012   LUNG BIOPSY  1999   neg   MASTECTOMY Left 1999   and lymphnodes      OB History   No obstetric history on file.      Home Medications    Prior to Admission medications   Medication Sig Start Date End Date Taking? Authorizing Provider  acetaminophen (TYLENOL) 325 MG tablet Take 2 tablets (650 mg total) by mouth every 6 (six) hours as needed for mild pain, fever or headache (or Fever >/= 101). 04/12/19  Yes Emokpae, Courage, MD  albuterol (PROVENTIL) (2.5 MG/3ML) 0.083% nebulizer solution Take 3 mLs (2.5 mg total) by nebulization every 4 (four) hours as needed for wheezing or shortness of breath. 04/12/19  Yes Roxan Hockey, MD  allopurinol (ZYLOPRIM) 100 MG tablet Take  1 tablet (100 mg total) by mouth daily. 03/19/19  Yes Paz, Alda Berthold, MD  amLODipine (NORVASC) 10 MG tablet Take 1 tablet (10 mg total) by mouth daily. 03/06/19  Yes Colon Branch, MD  aspirin 81 MG tablet Take 1 tablet (81 mg total) by mouth daily with breakfast. 04/12/19  Yes Emokpae, Courage, MD  atorvastatin (LIPITOR) 20 MG tablet Take 1 tablet (20 mg total) by mouth daily. 01/31/19  Yes Paz, Alda Berthold, MD  Calcium Carb-Cholecalciferol (CALTRATE 600+D3 PO) Take 1 tablet by mouth daily.   Yes [provider]  Cholecalciferol (VITAMIN D3) 1000 UNITS  CAPS Take 1 capsule by mouth daily.    Yes [provider]  dicyclomine (BENTYL) 10 MG capsule Take 2 capsules (20 mg total) by mouth 4 (four) times daily -  before meals and at bedtime. 04/12/19 05/12/19 Yes Emokpae, Courage, MD  furosemide (LASIX) 40 MG tablet Take 40 mg by mouth daily.   Yes [provider]  hydrOXYzine (ATARAX/VISTARIL) 10 MG tablet Take 1 tablet (10 mg total) by mouth 3 (three) times daily as needed for nausea. 04/12/19  Yes Emokpae, Courage, MD  loperamide (IMODIUM A-D) 2 MG tablet Take 2 mg by mouth 4 (four) times daily as needed for diarrhea or loose stools.   Yes [provider]  metoprolol tartrate (LOPRESSOR) 25 MG tablet Take 1 tablet (25 mg total) by mouth 2 (two) times daily. 04/12/19  Yes Emokpae, Courage, MD  Multiple Vitamins-Minerals (CENTRUM SILVER PO) Take 1 tablet by mouth daily.    Yes [provider]  ondansetron (ZOFRAN) 4 MG tablet Take 1 tablet (4 mg total) by mouth every 8 (eight) hours as needed for nausea or vomiting. 04/12/19  Yes Emokpae, Courage, MD  pantoprazole (PROTONIX) 40 MG tablet Take 40 mg by mouth daily.   Yes [provider]  vitamin B-12 (CYANOCOBALAMIN) 1000 MCG tablet Take 1,000 mcg by mouth daily.     Yes [provider]  vitamin C (ASCORBIC ACID) 500 MG tablet Take 500 mg by mouth daily.   Yes [provider]  escitalopram (LEXAPRO) 5 MG tablet 1 tablet daily for 1 week, then 2 tablets daily 04/16/19   [place] Branch, MD  furosemide (LASIX) 20 MG tablet Take 1 tablet (20 mg total) by mouth daily. Patient not taking: Reported on 04/20/2019 04/12/19   Roxan Hockey, MD  predniSONE (DELTASONE) 20 MG tablet Take 1 tablet (20 mg total) by mouth daily with breakfast. 04/12/19   Roxan Hockey, MD    Family History Family History  Problem Relation Age of Onset   Breast cancer Other        ? of    Heart attack Father 8   Diabetes Sister        2   Hypertension Sister    Colon cancer  Neg Hx    Stomach cancer Neg Hx     Social History Social History   Tobacco Use   Smoking status: Never Smoker   Smokeless tobacco: Never Used  Substance Use Topics   Alcohol use: Yes    Alcohol/week: 0.0 standard drinks    Comment: rarely   Drug use: No     Allergies   Codeine, Morphine, Oxycodone-acetaminophen, Sulfonamide derivatives, and Tramadol hcl   Review of Systems Review of Systems  Gastrointestinal: Positive for abdominal pain.  Neurological: Positive for weakness.  All other systems reviewed and are negative.    Physical Exam Updated Vital Signs BP (!) 131/49 (  BP Location: Right Arm)    Pulse 79    Temp 98 F (36.7 C) (Oral)    Resp 18    Ht 5' 4"  (1.626 m)    Wt 59.9 kg    SpO2 97%    BMI 22.66 kg/m   Physical Exam Vitals signs and nursing note reviewed.  Constitutional:      General: She is not in acute distress.    Appearance: Normal appearance. She is well-developed.  HENT:     Head: Normocephalic and atraumatic.     Right Ear: Hearing normal.     Left Ear: Hearing normal.     Nose: Nose normal.  Eyes:     Conjunctiva/sclera: Conjunctivae normal.     Pupils: Pupils are equal, round, and reactive to light.  Neck:     Musculoskeletal: Normal range of motion and neck supple.  Cardiovascular:     Rate and Rhythm: Regular rhythm.     Heart sounds: S1 normal and S2 normal. No murmur. No friction rub. No gallop.   Pulmonary:     Effort: Pulmonary effort is normal. No respiratory distress.     Breath sounds: Normal breath sounds.  Chest:     Chest wall: No tenderness.  Abdominal:     General: Bowel sounds are normal.     Palpations: Abdomen is soft.     Tenderness: There is generalized abdominal tenderness. There is no guarding or rebound. Negative signs include Murphy's sign and McBurney's sign.     Hernia: No hernia is present.  Musculoskeletal: Normal range of motion.  Skin:    General: Skin is warm and dry.     Findings: No rash.    Neurological:     Mental Status: She is alert and oriented to person, place, and time.     GCS: GCS eye subscore is 4. GCS verbal subscore is 5. GCS motor subscore is 6.     Cranial Nerves: No cranial nerve deficit.     Sensory: No sensory deficit.     Coordination: Coordination normal.  Psychiatric:        Speech: Speech normal.        Behavior: Behavior normal.        Thought Content: Thought content normal.      ED Treatments / Results  Labs (all labs ordered are listed, but only abnormal results are displayed) Labs Reviewed  CBC WITH DIFFERENTIAL/PLATELET - Abnormal; Notable for the following components:      Result Value   RBC 3.10 (*)    Hemoglobin 9.2 (*)    HCT 28.2 (*)    RDW 18.5 (*)    Platelets 404 (*)    Abs Immature Granulocytes 0.50 (*)    All other components within normal limits  COMPREHENSIVE METABOLIC PANEL - Abnormal; Notable for the following components:   Sodium 129 (*)    Chloride 87 (*)    Glucose, Bld 149 (*)    BUN 50 (*)    Creatinine, Ser 3.07 (*)    Calcium 8.5 (*)    Albumin 2.6 (*)    GFR calc non Af Amer 14 (*)    GFR calc Af Amer 16 (*)    All other components within normal limits  URINALYSIS, ROUTINE W REFLEX MICROSCOPIC - Abnormal; Notable for the following components:   Hgb urine dipstick SMALL (*)    Bacteria, UA RARE (*)    All other components within normal limits  SEDIMENTATION RATE - Abnormal; Notable  for the following components:   Sed Rate 130 (*)    All other components within normal limits  C-REACTIVE PROTEIN - Abnormal; Notable for the following components:   CRP 17.3 (*)    All other components within normal limits  GLUCOSE, CAPILLARY - Abnormal; Notable for the following components:   Glucose-Capillary 105 (*)    All other components within normal limits  SARS CORONAVIRUS 2 (HOSPITAL ORDER, [facility])  CULTURE, BLOOD (ROUTINE X 2)  CULTURE, BLOOD (ROUTINE X 2)  GASTROINTESTINAL PANEL BY  PCR, STOOL (REPLACES STOOL CULTURE)  C DIFFICILE QUICK SCREEN W PCR REFLEX  LIPASE, BLOOD  LACTIC ACID, PLASMA  GLUCOSE, CAPILLARY  COMPREHENSIVE METABOLIC PANEL  CBC WITH DIFFERENTIAL/PLATELET    EKG EKG Interpretation  Date/Time:  Friday April 20 2019 06:50:11 EDT Ventricular Rate:  74 PR Interval:    QRS Duration: 86 QT Interval:  387 QTC Calculation: 430 R Axis:   59 Text Interpretation:  Sinus rhythm Normal ECG Confirmed by Orpah Greek [phone]) on 04/20/2019 6:53:02 AM Also confirmed by Orpah Greek [phone]), editor Philomena Doheny [phone])  on 04/20/2019 7:13:52 AM   Radiology Ct Abdomen Pelvis Wo Contrast  Result Date: 04/20/2019 CLINICAL DATA:  Acute generalized abdominal pain. EXAM: CT ABDOMEN AND PELVIS WITHOUT CONTRAST TECHNIQUE: Multidetector CT imaging of the abdomen and pelvis was performed following the standard protocol without IV contrast. COMPARISON:  CT scan of April 07, 2019. FINDINGS: Lower chest: Mild right basilar subsegmental atelectasis is noted. Hepatobiliary: No focal liver abnormality is seen. No gallstones, gallbladder wall thickening, or biliary dilatation. Pancreas: Unremarkable. No pancreatic ductal dilatation or surrounding inflammatory changes. Spleen: Calcified splenic granulomata are noted. Adrenals/Urinary Tract: Adrenal glands appear normal. Stable bilateral renal cysts are noted. No definite hydronephrosis or renal obstruction is noted. No renal or ureteral calculi are noted. Urinary bladder is unremarkable. Stomach/Bowel: The stomach appears normal. Sigmoid diverticulosis is noted without inflammation. Dilated small bowel loop is noted in the right lower quadrant with minimal surrounding inflammatory changes and some fecalization present. The more distal small bowel and ileum is unremarkable. This most likely represents focal inflammation or enteritis. Vascular/Lymphatic: Aortic atherosclerosis. No enlarged abdominal or pelvic lymph  nodes. Reproductive: Small calcified uterine fibroid is noted. No adnexal abnormality is noted. Other: No abdominal wall hernia or abnormality. No abdominopelvic ascites. Musculoskeletal: No acute or significant osseous findings. IMPRESSION: Dilated small bowel loop is seen in the right lower quadrant most consistent with proximal ileum, with minimal surrounding inflammatory changes and some fecalization. This most likely represents focal enteritis or possibly inflammatory bowel disease, although significant wall thickening is not visualized. Sigmoid diverticulosis without inflammation. Aortic Atherosclerosis (ICD10-I70.0). Electronically Signed   By: Marijo Conception M.D.   On: 04/20/2019 08:39   Dg Chest 2 View  Result Date: 04/20/2019 CLINICAL DATA:  Fever and weakness. EXAM: CHEST - 2 VIEW COMPARISON:  Two-view chest x-ray 04/16/2019 FINDINGS: Heart size is exaggerated by low lung volumes. Is chronic elevation of the right hemidiaphragm. Mild bibasilar opacities are present. Postsurgical changes are noted in the left axilla. The visualized soft tissues and bony thorax are otherwise unremarkable. Atherosclerotic changes are noted in the aorta. IMPRESSION: 1. Lung volumes with mild bibasilar airspace disease, likely atelectasis. 2. Borderline cardiomegaly without failure. 3. Aortic atherosclerosis. Electronically Signed   By: San Morelle M.D.   On: 04/20/2019 06:01    Procedures Procedures (including critical care time)  Medications Ordered in ED Medications  acetaminophen (TYLENOL)  tablet 650 mg (650 mg Oral Refused 04/20/19 0952)  albuterol (PROVENTIL) (2.5 MG/3ML) 0.083% nebulizer solution 2.5 mg (has no administration in time range)  allopurinol (ZYLOPRIM) tablet 100 mg (100 mg Oral Given 04/20/19 2135)  dicyclomine (BENTYL) capsule 20 mg (20 mg Oral Given 04/20/19 2134)  escitalopram (LEXAPRO) tablet 5 mg (5 mg Oral Given 04/20/19 2135)  hydrOXYzine (ATARAX/VISTARIL) tablet 10 mg (has no  administration in time range)  pantoprazole (PROTONIX) EC tablet 40 mg (has no administration in time range)  0.9 %  sodium chloride infusion ( Intravenous New Bag/Given 04/20/19 1409)  insulin aspart (novoLOG) injection 0-9 Units (0 Units Subcutaneous Not Given 04/20/19 2111)  heparin injection 5,000 Units (5,000 Units Subcutaneous Given 04/20/19 2137)  acetaminophen (TYLENOL) tablet 650 mg (has no administration in time range)    Or  acetaminophen (TYLENOL) suppository 650 mg (has no administration in time range)  ondansetron (ZOFRAN) tablet 4 mg (has no administration in time range)    Or  ondansetron (ZOFRAN) injection 4 mg (has no administration in time range)  piperacillin-tazobactam (ZOSYN) IVPB 2.25 g (2.25 g Intravenous New Bag/Given 04/20/19 2250)  cefTRIAXone (ROCEPHIN) 2 g in sodium chloride 0.9 % 100 mL IVPB (0 g Intravenous Stopped 04/20/19 0708)    And  metroNIDAZOLE (FLAGYL) IVPB 500 mg (0 mg Intravenous Stopped 04/20/19 0827)  iohexol (OMNIPAQUE) 300 MG/ML solution 30 mL (30 mLs Oral Contrast Given 04/20/19 0607)     Initial Impression / Assessment and Plan / ED Course  I have reviewed the triage vital signs and the nursing notes.  Pertinent labs & imaging results that were available during my care of the patient were reviewed by me and considered in my medical decision making (see chart for details).        Patient presented to the ER for generalized weakness.  Review of her records revealed a complex recent past medical history.  She had been hospitalized for community-acquired pneumonia recently.  Additionally last week she had a repeat hospitalization for generalized weakness at which time she was found to have an enteritis and possible microperforation.  She was on antibiotics for this in the hospital and followed by surgery, no surgical intervention was recommended.  Discussion with the patient at arrival reveals that she is experiencing more than just generalized  weakness.  She has began to have increasing abdominal pain as well.  Examination revealed tenderness but no signs of acute surgical process or peritonitis at this time.  She was found to be febrile.  With her multiple recent infections, this was felt to be significant.  She did not, however, have overt signs of sepsis.  Vital signs remained stable other than the fever.  Based on her recent history, she was given Rocephin and Flagyl for intra-abdominal coverage.  CT scan ordered.  Patient was signed out to oncoming ER physician to follow-up on urinalysis and CT scan to further evaluate for cause of fever.  Final Clinical Impressions(s) / ED Diagnoses   Final diagnoses:  Abdominal pain, unspecified abdominal location    ED Discharge Orders    None       Orpah Greek, MD 04/20/19 2339

## 2019-04-20 NOTE — ED Provider Notes (Signed)
Patient sent to me by Dr. Melchor Amour pending abdominal CT.  CT consistent with likely focal enteritis.  She had received IV antibiotics.  Still has abdominal pain at this time.  Will consult hospitalist for admission for observation.   Lacretia Leigh, MD 04/20/19 1038

## 2019-04-20 NOTE — Consult Note (Signed)
[facility] Surgery Consult Note  Samantha Mckenzie June 17, 1936  [id number].    Requesting MD: Horris Latino Chief Complaint/Reason for Consult: abdominal pain HPI:  Patient is an 83 year old female recently seen by our service for enteritis. She was discharged from the hospital 04/12/19. She continues to have intermittent diarrhea and abdominal pain. Abdominal pain localized to RLQ and patient reports sometimes is better that when this all started and sometimes is worse. She reports last BM was this AM and may have had some bloody material in it. She also reports poor PO intake and generalized weakness.   ROS: Review of Systems  Constitutional: Positive for chills, fever and malaise/fatigue.  Respiratory: Negative for shortness of breath and wheezing.   Cardiovascular: Negative for chest pain and palpitations.  Gastrointestinal: Positive for abdominal pain, blood in stool, diarrhea, nausea and vomiting. Negative for constipation.  Genitourinary: Negative for dysuria, frequency and urgency.  Musculoskeletal: Positive for falls.    Family History  Problem Relation Age of Onset  . Breast cancer Other        ? of   . Heart attack Father 57  . Diabetes Sister        2  . Hypertension Sister   . Colon cancer Neg Hx   . Stomach cancer Neg Hx     Past Medical History:  Diagnosis Date  . Allergic rhinitis   . Anemia   . Breast CA ([place])    surgery, chemo, XRT; had peripheral neuropathy (imbalance at times) after chemo  . Cellulitis    Left arm, recurrent   . Cellulitis LEFT arm recurrent 12/08/2011  . Chronic renal disease, stage IV (HCC)    Dr. Moshe Cipro  . Depression   . Fatigue   . GERD (gastroesophageal reflux disease)    gastritis, EGD 02/2007  . Gout   . Hyperlipidemia   . Hypertension   . Macular degeneration 2011  . Normal cardiac stress test 12/2014  . Osteoarthritis   . Renal insufficiency     Past Surgical History:  Procedure Laterality Date  . cataracts  bilaterally  1 -2012  . LUNG BIOPSY  1999   neg  . MASTECTOMY Left 1999   and lymphnodes     Social History:  reports that she has never smoked. She has never used smokeless tobacco. She reports current alcohol use. She reports that she does not use drugs.  Allergies:  Allergies  Allergen Reactions  . Codeine Nausea And Vomiting  . Morphine Nausea And Vomiting  . Oxycodone-Acetaminophen Nausea And Vomiting  . Sulfonamide Derivatives Nausea And Vomiting  . Tramadol Hcl Nausea And Vomiting    (Not in a hospital admission)   Blood pressure (!) 118/47, pulse 68, temperature (!) 97.5 F (36.4 C), temperature source Oral, resp. rate 16, height 5' 4"  (1.626 m), weight 59.9 kg, SpO2 95 %. Physical Exam: Physical Exam Constitutional:      General: She is not in acute distress.    Appearance: She is well-developed and normal weight.  HENT:     Head: Normocephalic and atraumatic.     Right Ear: External ear normal.     Left Ear: External ear normal.     Ears:     Comments: Nose/mouth covered with mask Eyes:     General: Lids are normal. No scleral icterus.    Conjunctiva/sclera: Conjunctivae normal.  Neck:     Musculoskeletal: Normal range of motion and neck supple.  Cardiovascular:     Rate and  Rhythm: Normal rate and regular rhythm.     Pulses:          Dorsalis pedis pulses are 2+ on the right side and 2+ on the left side.  Pulmonary:     Effort: Pulmonary effort is normal.     Breath sounds: Normal breath sounds.  Abdominal:     General: Abdomen is flat. Bowel sounds are normal. There is no distension.     Palpations: Abdomen is soft. There is no hepatomegaly or splenomegaly.     Tenderness: There is abdominal tenderness in the right lower quadrant. There is no guarding or rebound.     Hernia: No hernia is present.  Musculoskeletal:     Right lower leg: No edema.     Left lower leg: No edema.     Comments: ROM grossly intact in bilateral upper extremities  Skin:     General: Skin is warm and dry.     Coloration: Skin is not jaundiced.     Findings: No rash.  Neurological:     Mental Status: She is alert and oriented to person, place, and time.  Psychiatric:        Mood and Affect: Affect normal.        Speech: Speech normal.        Behavior: Behavior is cooperative.     Results for orders placed or performed during the hospital encounter of 04/20/19 (from the past 48 hour(s))  Urinalysis, Routine w reflex microscopic     Status: Abnormal   Collection Time: 04/20/19  5:39 AM  Result Value Ref Range   Color, Urine YELLOW YELLOW   APPearance CLEAR CLEAR   Specific Gravity, Urine 1.005 1.005 - 1.030   pH 6.0 5.0 - 8.0   Glucose, UA NEGATIVE NEGATIVE mg/dL   Hgb urine dipstick SMALL (A) NEGATIVE   Bilirubin Urine NEGATIVE NEGATIVE   Ketones, ur NEGATIVE NEGATIVE mg/dL   Protein, ur NEGATIVE NEGATIVE mg/dL   Nitrite NEGATIVE NEGATIVE   Leukocytes,Ua NEGATIVE NEGATIVE   RBC / HPF 0-5 0 - 5 RBC/hpf   WBC, UA 0-5 0 - 5 WBC/hpf   Bacteria, UA RARE (A) NONE SEEN   Squamous Epithelial / LPF 0-5 0 - 5    Comment: Performed at [facility], [place] [address], [place], [place] [address]  CBC with Differential/Platelet     Status: Abnormal   Collection Time: 04/20/19  6:29 AM  Result Value Ref Range   WBC 7.7 4.0 - 10.5 K/uL   RBC 3.10 (L) 3.87 - 5.11 MIL/uL   Hemoglobin 9.2 (L) 12.0 - 15.0 g/dL   HCT 28.2 (L) 36.0 - 46.0 %   MCV 91.0 80.0 - 100.0 fL   MCH 29.7 26.0 - 34.0 pg   MCHC 32.6 30.0 - 36.0 g/dL   RDW 18.5 (H) 11.5 - 15.5 %   Platelets 404 (H) 150 - 400 K/uL   nRBC 0.0 0.0 - 0.2 %   Neutrophils Relative % 52 %   Neutro Abs 6.0 1.7 - 7.7 K/uL   Band Neutrophils 26 %   Lymphocytes Relative 12 %   Lymphs Abs 0.9 0.7 - 4.0 K/uL   Monocytes Relative 3 %   Monocytes Absolute 0.2 0.1 - 1.0 K/uL   Eosinophils Relative 1 %   Eosinophils Absolute 0.1 0.0 - 0.5 K/uL   Basophils Relative 0 %   Basophils Absolute 0.0 0.0 - 0.1  K/uL   WBC Morphology DOHLE BODIES  Comment: INCREASED BANDS (>20% BANDS) MILD LEFT SHIFT (1-5% METAS, OCC MYELO, OCC BANDS) VACUOLATED NEUTROPHILS    Metamyelocytes Relative 2 %   Myelocytes 4 %   Abs Immature Granulocytes 0.50 (H) 0.00 - 0.07 K/uL    Comment: Performed at [facility], [place] [address], [place], [place] [address]  Comprehensive metabolic panel     Status: Abnormal   Collection Time: 04/20/19  6:29 AM  Result Value Ref Range   Sodium 129 (L) 135 - 145 mmol/L   Potassium 3.7 3.5 - 5.1 mmol/L   Chloride 87 (L) 98 - 111 mmol/L   CO2 27 22 - 32 mmol/L   Glucose, Bld 149 (H) 70 - 99 mg/dL   BUN 50 (H) 8 - 23 mg/dL   Creatinine, Ser 3.07 (H) 0.44 - 1.00 mg/dL   Calcium 8.5 (L) 8.9 - 10.3 mg/dL   Total Protein 6.9 6.5 - 8.1 g/dL   Albumin 2.6 (L) 3.5 - 5.0 g/dL   AST 16 15 - 41 U/L   ALT 14 0 - 44 U/L   Alkaline Phosphatase 56 38 - 126 U/L   Total Bilirubin 0.6 0.3 - 1.2 mg/dL   GFR calc non Af Amer 14 (L) >60 mL/min   GFR calc Af Amer 16 (L) >60 mL/min   Anion gap 15 5 - 15    Comment: Performed at [facility], [place] [address] [place], [place] [address]  Lipase, blood     Status: None   Collection Time: 04/20/19  6:29 AM  Result Value Ref Range   Lipase 31 11 - 51 U/L    Comment: Performed at [facility], [place] [address], [place], [place] [address]  Lactic acid, plasma     Status: None   Collection Time: 04/20/19  6:29 AM  Result Value Ref Range   Lactic Acid, Venous 0.8 0.5 - 1.9 mmol/L    Comment: Performed at [facility], [place] [address], [place], [place] [address]  SARS Coronavirus 2 (CEPHEID - Performed in [place] hospital lab), Hosp Order     Status: None   Collection Time: 04/20/19  8:13 AM   Specimen: Nasopharyngeal Swab  Result Value Ref Range   SARS Coronavirus 2 NEGATIVE NEGATIVE    Comment: (NOTE) If result is NEGATIVE SARS-CoV-2 target nucleic acids are NOT  DETECTED. The SARS-CoV-2 RNA is generally detectable in upper and lower  respiratory specimens during the acute phase of infection. The lowest  concentration of SARS-CoV-2 viral copies this assay can detect is 250  copies / mL. A negative result does not preclude SARS-CoV-2 infection  and should not be used as the sole basis for treatment or other  patient management decisions.  A negative result may occur with  improper specimen collection / handling, submission of specimen other  than nasopharyngeal swab, presence of viral mutation(s) within the  areas targeted by this assay, and inadequate number of viral copies  (<250 copies / mL). A negative result must be combined with clinical  observations, patient history, and epidemiological information. If result is POSITIVE SARS-CoV-2 target nucleic acids are DETECTED. The SARS-CoV-2 RNA is generally detectable in upper and lower  respiratory specimens dur ing the acute phase of infection.  Positive  results are indicative of active infection with SARS-CoV-2.  Clinical  correlation with patient history and other diagnostic information is  necessary to determine patient infection status.  Positive results do  not rule out bacterial infection  or co-infection with other viruses. If result is PRESUMPTIVE POSTIVE SARS-CoV-2 nucleic acids MAY BE PRESENT.   A presumptive positive result was obtained on the submitted specimen  and confirmed on repeat testing.  While 2019 novel coronavirus  (SARS-CoV-2) nucleic acids may be present in the submitted sample  additional confirmatory testing may be necessary for epidemiological  and / or clinical management purposes  to differentiate between  SARS-CoV-2 and other Sarbecovirus currently known to infect humans.  If clinically indicated additional testing with an alternate test  methodology [phone]) is advised. The SARS-CoV-2 RNA is generally  detectable in upper and lower respiratory sp ecimens during  the acute  phase of infection. The expected result is Negative. Fact Sheet for Patients:  StrictlyIdeas.no Fact Sheet for Healthcare Providers: BankingDealers.co.za This test is not yet approved or cleared by the [place] FDA and has been authorized for detection and/or diagnosis of SARS-CoV-2 by FDA under an Emergency Use Authorization (EUA).  This EUA will remain in effect (meaning this test can be used) for the duration of the COVID-19 declaration under Section 564(b)(1) of the Act, 21 U.S.C. section 360bbb-3(b)(1), unless the authorization is terminated or revoked sooner. Performed at [facility], [place] [address], [place], [place] [address]    Ct Abdomen Pelvis Wo Contrast  Result Date: 04/20/2019 CLINICAL DATA:  Acute generalized abdominal pain. EXAM: CT ABDOMEN AND PELVIS WITHOUT CONTRAST TECHNIQUE: Multidetector CT imaging of the abdomen and pelvis was performed following the standard protocol without IV contrast. COMPARISON:  CT scan of April 07, 2019. FINDINGS: Lower chest: Mild right basilar subsegmental atelectasis is noted. Hepatobiliary: No focal liver abnormality is seen. No gallstones, gallbladder wall thickening, or biliary dilatation. Pancreas: Unremarkable. No pancreatic ductal dilatation or surrounding inflammatory changes. Spleen: Calcified splenic granulomata are noted. Adrenals/Urinary Tract: Adrenal glands appear normal. Stable bilateral renal cysts are noted. No definite hydronephrosis or renal obstruction is noted. No renal or ureteral calculi are noted. Urinary bladder is unremarkable. Stomach/Bowel: The stomach appears normal. Sigmoid diverticulosis is noted without inflammation. Dilated small bowel loop is noted in the right lower quadrant with minimal surrounding inflammatory changes and some fecalization present. The more distal small bowel and ileum is unremarkable. This most likely represents focal  inflammation or enteritis. Vascular/Lymphatic: Aortic atherosclerosis. No enlarged abdominal or pelvic lymph nodes. Reproductive: Small calcified uterine fibroid is noted. No adnexal abnormality is noted. Other: No abdominal wall hernia or abnormality. No abdominopelvic ascites. Musculoskeletal: No acute or significant osseous findings. IMPRESSION: Dilated small bowel loop is seen in the right lower quadrant most consistent with proximal ileum, with minimal surrounding inflammatory changes and some fecalization. This most likely represents focal enteritis or possibly inflammatory bowel disease, although significant wall thickening is not visualized. Sigmoid diverticulosis without inflammation. Aortic Atherosclerosis (ICD10-I70.0). Electronically Signed   By: Marijo Conception M.D.   On: 04/20/2019 08:39   Dg Chest 2 View  Result Date: 04/20/2019 CLINICAL DATA:  Fever and weakness. EXAM: CHEST - 2 VIEW COMPARISON:  Two-view chest x-ray 04/16/2019 FINDINGS: Heart size is exaggerated by low lung volumes. Is chronic elevation of the right hemidiaphragm. Mild bibasilar opacities are present. Postsurgical changes are noted in the left axilla. The visualized soft tissues and bony thorax are otherwise unremarkable. Atherosclerotic changes are noted in the aorta. IMPRESSION: 1. Lung volumes with mild bibasilar airspace disease, likely atelectasis. 2. Borderline cardiomegaly without failure. 3. Aortic atherosclerosis. Electronically Signed   By: San Morelle M.D.   On: 04/20/2019 06:01  Assessment/Plan HTN CKD stage IV GERD Depression HLD Gout Hx of breast CA  Abdominal pain  Enteritis - CT 6/27: focal inflammatory process of small bowel loop in R lateral pelvis with bowel wall thickening and adjacent infiltrative changes compatible with enteritis - CT today: Dilated small bowel loop is seen in the right lower quadrant most consistent with proximal ileum, with minimal surrounding inflammatory  changes and some fecalization. This most likely represents focal enteritis or possibly inflammatory bowel disease, although significant wall thickening is not visualized. - no peritonitis or indications for emergent surgical intervention - recommend bowel rest and abx with fever  - ordered GI panel and C.Diff - recommend GI consult  FEN: NPO, IVF VTE: SCDs ID: [email or web address] given today   Brigid Re, [facility] Surgery 04/20/2019, 12:29 PM Pager: [phone] Consults: [phone]

## 2019-04-20 NOTE — H&P (Signed)
History and Physical  Samantha Mckenzie DPO:[id number] DOB: [birth year] DOA: 04/20/2019  Patient coming from: Home & is able to ambulate  Chief Complaint: Abdominal pain/nausea/vomiting/diarrhea  HPI: Samantha Mckenzie is a 83 y.o. female with medical history significant for hypertension, hyperlipidemia, diabetes-diet-controlled, GERD, gout, depression, breast cancer (status post surgery, radiation and chemotherapy), CKD stage IV, diastolic CHF, presents to the ED complaining of abdominal pain, intermittent nausea/vomiting/diarrhea for the past couple of weeks.  Of note, patient was recently discharged on 04/12/2019 for similar complaints.  During the last admission CT showed enteritis with possible microperforations, patient was treated with IV antibiotics and discharged home.  Patient continues to have intermittent right lower quadrant abdominal tenderness, sharp, no radiation, associated with nausea, vomiting, intermittent diarrhea prompting her to seek medical care.  Patient denies any chest pain, shortness of breath, cough, runny nose, sore throat.  ED Course: On presentation, patient noted to have a fever of 101, labs showed no leukocytosis, showed hyponatremia, low albumin, CRP elevated at 17.3, ESR 130, chronic anemia.  CT abdomen pelvis showed focal enteritis or possibly inflammatory bowel disease.  COVID-19 test was negative.  General surgery and GI consulted for further management  Review of Systems: Review of systems are otherwise negative   Past Medical History:  Diagnosis Date   Allergic rhinitis    Anemia    Breast CA ([place])    surgery, chemo, XRT; had peripheral neuropathy (imbalance at times) after chemo   Cellulitis    Left arm, recurrent    Cellulitis LEFT arm recurrent 12/08/2011   Chronic renal disease, stage IV ([place])    Dr. Moshe Cipro   Depression    Fatigue    GERD (gastroesophageal reflux disease)    gastritis, EGD 02/2007   Gout    Hyperlipidemia     Hypertension    Macular degeneration 2011   Normal cardiac stress test 12/2014   Osteoarthritis    Renal insufficiency    Past Surgical History:  Procedure Laterality Date   cataracts bilaterally  1 -2012   LUNG BIOPSY  1999   neg   MASTECTOMY Left 1999   and lymphnodes     Social History:  reports that she has never smoked. She has never used smokeless tobacco. She reports current alcohol use. She reports that she does not use drugs.   Allergies  Allergen Reactions   Codeine Nausea And Vomiting   Morphine Nausea And Vomiting   Oxycodone-Acetaminophen Nausea And Vomiting   Sulfonamide Derivatives Nausea And Vomiting   Tramadol Hcl Nausea And Vomiting    Family History  Problem Relation Age of Onset   Breast cancer Other        ? of    Heart attack Father 19   Diabetes Sister        2   Hypertension Sister    Colon cancer Neg Hx    Stomach cancer Neg Hx       Prior to Admission medications   Medication Sig Start Date End Date Taking? Authorizing Provider  acetaminophen (TYLENOL) 325 MG tablet Take 2 tablets (650 mg total) by mouth every 6 (six) hours as needed for mild pain, fever or headache (or Fever >/= 101). 04/12/19  Yes Emokpae, Courage, MD  albuterol (PROVENTIL) (2.5 MG/3ML) 0.083% nebulizer solution Take 3 mLs (2.5 mg total) by nebulization every 4 (four) hours as needed for wheezing or shortness of breath. 04/12/19  Yes Emokpae, Courage, MD  allopurinol (ZYLOPRIM) 100 MG tablet Take 1 tablet (  100 mg total) by mouth daily. 03/19/19  Yes Paz, Alda Berthold, MD  amLODipine (NORVASC) 10 MG tablet Take 1 tablet (10 mg total) by mouth daily. 03/06/19  Yes Colon Branch, MD  aspirin 81 MG tablet Take 1 tablet (81 mg total) by mouth daily with breakfast. 04/12/19  Yes Emokpae, Courage, MD  atorvastatin (LIPITOR) 20 MG tablet Take 1 tablet (20 mg total) by mouth daily. 01/31/19  Yes Paz, Alda Berthold, MD  Calcium Carb-Cholecalciferol (CALTRATE 600+D3 PO) Take 1 tablet by mouth  daily.   Yes [provider]  Cholecalciferol (VITAMIN D3) 1000 UNITS CAPS Take 1 capsule by mouth daily.    Yes [provider]  dicyclomine (BENTYL) 10 MG capsule Take 2 capsules (20 mg total) by mouth 4 (four) times daily -  before meals and at bedtime. 04/12/19 05/12/19 Yes Emokpae, Courage, MD  furosemide (LASIX) 40 MG tablet Take 40 mg by mouth daily.   Yes [provider]  hydrOXYzine (ATARAX/VISTARIL) 10 MG tablet Take 1 tablet (10 mg total) by mouth 3 (three) times daily as needed for nausea. 04/12/19  Yes Emokpae, Courage, MD  loperamide (IMODIUM A-D) 2 MG tablet Take 2 mg by mouth 4 (four) times daily as needed for diarrhea or loose stools.   Yes [provider]  metoprolol tartrate (LOPRESSOR) 25 MG tablet Take 1 tablet (25 mg total) by mouth 2 (two) times daily. 04/12/19  Yes Emokpae, Courage, MD  Multiple Vitamins-Minerals (CENTRUM SILVER PO) Take 1 tablet by mouth daily.    Yes [provider]  ondansetron (ZOFRAN) 4 MG tablet Take 1 tablet (4 mg total) by mouth every 8 (eight) hours as needed for nausea or vomiting. 04/12/19  Yes Emokpae, Courage, MD  pantoprazole (PROTONIX) 40 MG tablet Take 40 mg by mouth daily.   Yes [provider]  vitamin B-12 (CYANOCOBALAMIN) 1000 MCG tablet Take 1,000 mcg by mouth daily.     Yes [provider]  vitamin C (ASCORBIC ACID) 500 MG tablet Take 500 mg by mouth daily.   Yes [provider]  escitalopram (LEXAPRO) 5 MG tablet 1 tablet daily for 1 week, then 2 tablets daily 04/16/19   Colon Branch, MD  furosemide (LASIX) 20 MG tablet Take 1 tablet (20 mg total) by mouth daily. Patient not taking: Reported on 04/20/2019 04/12/19   Roxan Hockey, MD  predniSONE (DELTASONE) 20 MG tablet Take 1 tablet (20 mg total) by mouth daily with breakfast. 04/12/19   Roxan Hockey, MD    Physical Exam: BP (!) 134/45 (BP Location: Right Arm)    Pulse 66    Temp 97.7 F (36.5 C) (Oral)    Resp 14    Ht 5'  4" (1.626 m)    Wt 59.9 kg    SpO2 94%    BMI 22.66 kg/m   General: AAO x3, NAD Eyes: Normal ENT: Normal Neck: Supple Cardiovascular: S1, S2 present Respiratory: CTA B Abdomen: Soft, TTP in RLQ, non-distended, bowel sounds present Skin: Normal Musculoskeletal: No pedal edema bilaterally Psychiatric: Normal mood Neurologic: No focal neurologic deficit noted          Labs on Admission:  Basic Metabolic Panel: Recent Labs  Lab 04/16/19 1214 04/20/19 0629  NA 133* 129*  K 3.9 3.7  CL 93* 87*  CO2 26 27  GLUCOSE 173* 149*  BUN 45* 50*  CREATININE 3.11* 3.07*  CALCIUM 7.5* 8.5*   Liver Function Tests: Recent Labs  Lab 04/16/19 1214 04/20/19 8032  AST 15 16  ALT 17 14  ALKPHOS 64 56  BILITOT 0.5 0.6  PROT 6.8 6.9  ALBUMIN 3.1* 2.6*   Recent Labs  Lab 04/20/19 0629  LIPASE 31   No results for input(s): AMMONIA in the last 168 hours. CBC: Recent Labs  Lab 04/16/19 1214 04/20/19 0629  WBC 8.1 7.7  NEUTROABS 7.1 6.0  HGB 10.0* 9.2*  HCT 30.7* 28.2*  MCV 89.0 91.0  PLT 513.0* 404*   Cardiac Enzymes: No results for input(s): CKTOTAL, CKMB, CKMBINDEX, TROPONINI in the last 168 hours.  BNP (last 3 results) Recent Labs    04/07/19 2157  BNP 294.1*    ProBNP (last 3 results) No results for input(s): PROBNP in the last 8760 hours.  CBG: Recent Labs  Lab 04/20/19 1634  GLUCAP 105*    Radiological Exams on Admission: Ct Abdomen Pelvis Wo Contrast  Result Date: 04/20/2019 CLINICAL DATA:  Acute generalized abdominal pain. EXAM: CT ABDOMEN AND PELVIS WITHOUT CONTRAST TECHNIQUE: Multidetector CT imaging of the abdomen and pelvis was performed following the standard protocol without IV contrast. COMPARISON:  CT scan of April 07, 2019. FINDINGS: Lower chest: Mild right basilar subsegmental atelectasis is noted. Hepatobiliary: No focal liver abnormality is seen. No gallstones, gallbladder wall thickening, or biliary dilatation. Pancreas: Unremarkable. No  pancreatic ductal dilatation or surrounding inflammatory changes. Spleen: Calcified splenic granulomata are noted. Adrenals/Urinary Tract: Adrenal glands appear normal. Stable bilateral renal cysts are noted. No definite hydronephrosis or renal obstruction is noted. No renal or ureteral calculi are noted. Urinary bladder is unremarkable. Stomach/Bowel: The stomach appears normal. Sigmoid diverticulosis is noted without inflammation. Dilated small bowel loop is noted in the right lower quadrant with minimal surrounding inflammatory changes and some fecalization present. The more distal small bowel and ileum is unremarkable. This most likely represents focal inflammation or enteritis. Vascular/Lymphatic: Aortic atherosclerosis. No enlarged abdominal or pelvic lymph nodes. Reproductive: Small calcified uterine fibroid is noted. No adnexal abnormality is noted. Other: No abdominal wall hernia or abnormality. No abdominopelvic ascites. Musculoskeletal: No acute or significant osseous findings. IMPRESSION: Dilated small bowel loop is seen in the right lower quadrant most consistent with proximal ileum, with minimal surrounding inflammatory changes and some fecalization. This most likely represents focal enteritis or possibly inflammatory bowel disease, although significant wall thickening is not visualized. Sigmoid diverticulosis without inflammation. Aortic Atherosclerosis (ICD10-I70.0). Electronically Signed   By: Marijo Conception M.D.   On: 04/20/2019 08:39   Dg Chest 2 View  Result Date: 04/20/2019 CLINICAL DATA:  Fever and weakness. EXAM: CHEST - 2 VIEW COMPARISON:  Two-view chest x-ray 04/16/2019 FINDINGS: Heart size is exaggerated by low lung volumes. Is chronic elevation of the right hemidiaphragm. Mild bibasilar opacities are present. Postsurgical changes are noted in the left axilla. The visualized soft tissues and bony thorax are otherwise unremarkable. Atherosclerotic changes are noted in the aorta.  IMPRESSION: 1. Lung volumes with mild bibasilar airspace disease, likely atelectasis. 2. Borderline cardiomegaly without failure. 3. Aortic atherosclerosis. Electronically Signed   By: San Morelle M.D.   On: 04/20/2019 06:01    EKG: Independently reviewed.  Normal sinus rhythm  Assessment/Plan Present on Admission:  Enteritis  Essential hypertension  Diabetes mellitus type 2 with complications (HCC)  Hyperlipidemia  GOUT  Normocytic anemia  GERD  Depression  Principal Problem:   Enteritis Active Problems:   Diabetes mellitus type 2 with complications (HCC)   Hyperlipidemia   GOUT   Normocytic anemia   Essential hypertension   GERD  Depression  Abdominal pain likely 2/2 enteritis vs inflammatory bowel disease Noted a temp spike of 101 on presentation, no leukocytosis ESR, CRP elevated GI stool panel, C. difficile pending BC x2 pending CT abdomen pelvis showed focal enteritis or possibly inflammatory bowel disease General surgery consulted, appreciate recommendation GI consulted, appreciate recommendation Continue IV Zosyn, IV fluids Continue Bentyl Monitor closely  Hyponatremia Likely due to poor oral intake Continue IV fluids BMP  CKD stage IV Creatinine at baseline Daily BMP  Anemia of chronic kidney disease Hemoglobin at baseline Daily CBC  Chronic diastolic CHF Stable Gentle hydration  Diabetes mellitus type 2 Last A1c 6.4 SSI, Accu-Cheks, hypoglycemic protocol  Hypertension BP somewhat soft Hold home amlodipine, metoprolol for now  GERD Continue Protonix  Depression Continue Lexapro     DVT prophylaxis: Heparin  Code Status: DNR  Family Communication: None at bedside  Disposition Plan: To be determined  Consults called: general surgery, GI  Admission status: Observation    Alma Friendly MD [facility]  If 7PM-7AM, please contact night-coverage [email or web address]  04/20/2019, 6:42 PM

## 2019-04-20 NOTE — Consult Note (Addendum)
Referring Provider:  Dr. Horris Latino, [facility] Primary Care Physician:  Colon Branch, MD Primary Gastroenterologist:  Dr. Henrene Pastor  Reason for Consultation:  Abdominal pain  HPI: Samantha Mckenzie is a 83 y.o. female with past medical history significant for breast cancer status post surgery and chemo, stage IV kidney disease, depression, GERD, hyperlipidemia, hypertension, macular degeneration.  She was recently hospitalized and actually just discharged on July 2 after coming in with complaints of diarrhea and abdominal pain.  CT scan of the abdomen pelvis on 04/07/2019 showed the following:  IMPRESSION: Focal inflammatory process of the small bowel loop in the upper RIGHT lateral pelvis with bowel wall thickening and adjacent infiltrative changes compatible with enteritis.  This could be due to infection, focal inflammatory bowel disease, foreign body perforation.  Tiny foci of gas within or immediately adjacent to the small bowel wall at this site, cannot exclude micro perforation.  No definite bowel dilatation to suggest obstruction, though small bowel stool sign is seen proximal to this indicating prolonged transit.  No free air or abscess identified.  She was seen by the surgical service, treated with antibiotics with improvement and was discharged home.  She reportedly did well for a couple of days, but then symptoms returned and she presented back to the emergency department again today.  She reports abdominal pain mostly in the right lower quadrant with diarrhea.  At the time of my visit with her she was very lethargic/sleepy and very hard to arouse to get her to answer questions so I was not able to obtain much history from her.  She kept her eyes closed our entire visit.  She did point for me indicating that the most abdominal pain was in the right lower quadrant.  CT scan on this occasion showed the following:  IMPRESSION: Dilated small bowel loop is seen in the right lower quadrant  most consistent with proximal ileum, with minimal surrounding inflammatory changes and some fecalization. This most likely represents focal enteritis or possibly inflammatory bowel disease, although significant wall thickening is not visualized.  Sigmoid diverticulosis without inflammation.  Aortic Atherosclerosis (ICD10-I70.0).  She has been started back on IV Zosyn.  Surgical service is again seeing her while she is here.  Stool studies have been ordered and are pending.  CRP and sed rate were also ordered by the admitting team.  Her last colonoscopy was in December 2012 by Dr. Henrene Pastor at which time she was found to have only moderate diverticulosis.  Just of note, after further review of her chart it appears that she has undergone multiple CT scans over the years for complaints of abdominal pain and diarrhea.    Past Medical History:  Diagnosis Date   Allergic rhinitis    Anemia    Breast CA ([place])    surgery, chemo, XRT; had peripheral neuropathy (imbalance at times) after chemo   Cellulitis    Left arm, recurrent    Cellulitis LEFT arm recurrent 12/08/2011   Chronic renal disease, stage IV ([place])    Dr. Moshe Cipro   Depression    Fatigue    GERD (gastroesophageal reflux disease)    gastritis, EGD 02/2007   Gout    Hyperlipidemia    Hypertension    Macular degeneration 2011   Normal cardiac stress test 12/2014   Osteoarthritis    Renal insufficiency     Past Surgical History:  Procedure Laterality Date   cataracts bilaterally  1 -2012   [place]  neg   MASTECTOMY Left 1999   and lymphnodes     Prior to Admission medications   Medication Sig Start Date End Date Taking? Authorizing Provider  acetaminophen (TYLENOL) 325 MG tablet Take 2 tablets (650 mg total) by mouth every 6 (six) hours as needed for mild pain, fever or headache (or Fever >/= 101). 04/12/19   Emokpae, Courage, MD  albuterol (PROVENTIL) (2.5 MG/3ML) 0.083% nebulizer  solution Take 3 mLs (2.5 mg total) by nebulization every 4 (four) hours as needed for wheezing or shortness of breath. 04/12/19   Roxan Hockey, MD  allopurinol (ZYLOPRIM) 100 MG tablet Take 1 tablet (100 mg total) by mouth daily. 03/19/19   Colon Branch, MD  amLODipine (NORVASC) 10 MG tablet Take 1 tablet (10 mg total) by mouth daily. 03/06/19   Colon Branch, MD  aspirin 81 MG tablet Take 1 tablet (81 mg total) by mouth daily with breakfast. 04/12/19   Roxan Hockey, MD  atorvastatin (LIPITOR) 20 MG tablet Take 1 tablet (20 mg total) by mouth daily. 01/31/19   Colon Branch, MD  Calcium Carb-Cholecalciferol (CALTRATE 600+D3 PO) Take 1 tablet by mouth daily.    [provider]  Cholecalciferol (VITAMIN D3) 1000 UNITS CAPS Take 1 capsule by mouth daily.     [provider]  dicyclomine (BENTYL) 10 MG capsule Take 2 capsules (20 mg total) by mouth 4 (four) times daily -  before meals and at bedtime. 04/12/19 05/12/19  Roxan Hockey, MD  escitalopram (LEXAPRO) 5 MG tablet 1 tablet daily for 1 week, then 2 tablets daily 04/16/19   Colon Branch, MD  furosemide (LASIX) 20 MG tablet Take 1 tablet (20 mg total) by mouth daily. 04/12/19   Roxan Hockey, MD  hydrOXYzine (ATARAX/VISTARIL) 10 MG tablet Take 1 tablet (10 mg total) by mouth 3 (three) times daily as needed for nausea. 04/12/19   Roxan Hockey, MD  loperamide (IMODIUM A-D) 2 MG tablet Take 2 mg by mouth 4 (four) times daily as needed for diarrhea or loose stools.    [provider]  metoprolol tartrate (LOPRESSOR) 25 MG tablet Take 1 tablet (25 mg total) by mouth 2 (two) times daily. 04/12/19   Roxan Hockey, MD  Multiple Vitamins-Minerals (CENTRUM SILVER PO) Take 1 tablet by mouth daily.     [provider]  ondansetron (ZOFRAN) 4 MG tablet Take 1 tablet (4 mg total) by mouth every 8 (eight) hours as needed for nausea or vomiting. 04/12/19   Roxan Hockey, MD  pantoprazole (PROTONIX) 40 MG tablet Take 40 mg by mouth  daily.    [provider]  predniSONE (DELTASONE) 20 MG tablet Take 1 tablet (20 mg total) by mouth daily with breakfast. 04/12/19   Roxan Hockey, MD  vitamin B-12 (CYANOCOBALAMIN) 1000 MCG tablet Take 1,000 mcg by mouth daily.      [provider]  vitamin C (ASCORBIC ACID) 500 MG tablet Take 500 mg by mouth daily.    [provider]    Current Facility-Administered Medications  Medication Dose Route Frequency Provider Last Rate Last Dose   0.9 %  sodium chloride infusion   Intravenous Continuous Alma Friendly, MD       acetaminophen (TYLENOL) tablet 650 mg  650 mg Oral Q6H PRN Alma Friendly, MD       Or   acetaminophen (TYLENOL) suppository 650 mg  650 mg Rectal Q6H PRN Alma Friendly, MD       acetaminophen (TYLENOL)  tablet 650 mg  650 mg Oral Once Lacretia Leigh, MD       albuterol (PROVENTIL) (2.5 MG/3ML) 0.083% nebulizer solution 2.5 mg  2.5 mg Nebulization Q4H PRN Alma Friendly, MD       allopurinol (ZYLOPRIM) tablet 100 mg  100 mg Oral Daily Alma Friendly, MD       dicyclomine (BENTYL) capsule 20 mg  20 mg Oral TID AC & HS Alma Friendly, MD       escitalopram (LEXAPRO) tablet 5 mg  5 mg Oral QHS Alma Friendly, MD       heparin injection 5,000 Units  5,000 Units Subcutaneous Q8H Alma Friendly, MD       hydrOXYzine (ATARAX/VISTARIL) tablet 10 mg  10 mg Oral TID PRN Alma Friendly, MD       insulin aspart (novoLOG) injection 0-9 Units  0-9 Units Subcutaneous Q4H Alma Friendly, MD       ondansetron [facility]) tablet 4 mg  4 mg Oral Q6H PRN Alma Friendly, MD       Or   ondansetron [facility]) injection 4 mg  4 mg Intravenous Q6H PRN Alma Friendly, MD       pantoprazole (PROTONIX) EC tablet 40 mg  40 mg Oral Daily Alma Friendly, MD       piperacillin-tazobactam (ZOSYN) IVPB 2.25 g  2.25 g Intravenous Q8H Pham, Anh P, RPH        Allergies as of 04/20/2019 -  Review Complete 04/16/2019  Allergen Reaction Noted   Codeine Nausea And Vomiting 07/07/2007   Morphine Nausea And Vomiting 07/07/2007   Oxycodone-acetaminophen Nausea And Vomiting 07/07/2007   Sulfonamide derivatives Nausea And Vomiting 07/07/2007   Tramadol hcl Nausea And Vomiting 07/07/2007    Family History  Problem Relation Age of Onset   Breast cancer Other        ? of    Heart attack Father 81   Diabetes Sister        2   Hypertension Sister    Colon cancer Neg Hx    Stomach cancer Neg Hx     Social History   Socioeconomic History   Marital status: Married    Spouse name: Not on file   Number of children: 2   Years of education: Not on file   Highest education level: Not on file  Occupational History   Occupation: retired     Fish farm manager: RETIRED  Social Needs   Emergency planning/management officer strain: Not on file   Food insecurity    Worry: Not on file    Inability: Not on file   Transportation needs    Medical: Not on file    Non-medical: Not on file  Tobacco Use   Smoking status: Never Smoker   Smokeless tobacco: Never Used  Substance and Sexual Activity   Alcohol use: Yes    Alcohol/week: 0.0 standard drinks    Comment: rarely   Drug use: No   Sexual activity: Not Currently  Lifestyle   Physical activity    Days per week: Not on file    Minutes per session: Not on file   Stress: Not on file  Relationships   Social connections    Talks on phone: Not on file    Gets together: Not on file    Attends religious service: Not on file    Active member of club or organization: Not on file    Attends meetings of clubs or  organizations: Not on file    Relationship status: Not on file   Intimate partner violence    Fear of current or ex partner: Not on file    Emotionally abused: Not on file    Physically abused: Not on file    Forced sexual activity: Not on file  Other Topics Concern   Not on file  Social History Narrative   Lives w/  husband , 2 children in [place], 2 [place]           Review of Systems: ROS is O/W negative except as mentioned in HPI.  Physical Exam: Vital signs in last 24 hours: Temp:  [97.5 F (36.4 C)-101 F (38.3 C)] 97.7 F (36.5 C) (07/10 1322) Pulse Rate:  [65-86] 66 (07/10 1322) Resp:  [11-17] 14 (07/10 1322) BP: (104-134)/(45-52) 134/45 (07/10 1322) SpO2:  [90 %-97 %] 94 % (07/10 1322) Weight:  [59.9 kg] 59.9 kg (07/10 0453)   General:  Alert, Well-developed, well-nourished.  Very lethargic and difficult to arouse to get her to answer questions. Head:  Normocephalic and atraumatic. Eyes:  Sclera clear, no icterus.  Conjunctiva pink. Ears:  Normal auditory acuity. Mouth:  No deformity or lesions.   Lungs:  Clear throughout to auscultation.  No wheezes, crackles, or rhonchi.  Heart:  Regular rate and rhythm; no murmurs, clicks, rubs, or gallops. Abdomen:  Soft, non-distended.  BS present.  Diffuse moderate TTP> in RLQ. Msk:  Symmetrical without gross deformities. Pulses:  Normal pulses noted. Extremities:  LUE lymphedema noted. Neurologic:  Alert and oriented x 4;  grossly normal neurologically. Skin:  Intact without significant lesions or rashes. Psych:  Alert and cooperative. Normal mood and affect.  Intake/Output this shift: Total I/O In: 100 [IV Piggyback:100] Out: -   Lab Results: Recent Labs    04/20/19 0629  WBC 7.7  HGB 9.2*  HCT 28.2*  PLT 404*   BMET Recent Labs    04/20/19 0629  NA 129*  K 3.7  CL 87*  CO2 27  GLUCOSE 149*  BUN 50*  CREATININE 3.07*  CALCIUM 8.5*   LFT Recent Labs    04/20/19 0629  PROT 6.9  ALBUMIN 2.6*  AST 16  ALT 14  ALKPHOS 56  BILITOT 0.6   Studies/Results: Ct Abdomen Pelvis Wo Contrast  Result Date: 04/20/2019 CLINICAL DATA:  Acute generalized abdominal pain. EXAM: CT ABDOMEN AND PELVIS WITHOUT CONTRAST TECHNIQUE: Multidetector CT imaging of the abdomen and pelvis was performed following the standard protocol without IV  contrast. COMPARISON:  CT scan of April 07, 2019. FINDINGS: Lower chest: Mild right basilar subsegmental atelectasis is noted. Hepatobiliary: No focal liver abnormality is seen. No gallstones, gallbladder wall thickening, or biliary dilatation. Pancreas: Unremarkable. No pancreatic ductal dilatation or surrounding inflammatory changes. Spleen: Calcified splenic granulomata are noted. Adrenals/Urinary Tract: Adrenal glands appear normal. Stable bilateral renal cysts are noted. No definite hydronephrosis or renal obstruction is noted. No renal or ureteral calculi are noted. Urinary bladder is unremarkable. Stomach/Bowel: The stomach appears normal. Sigmoid diverticulosis is noted without inflammation. Dilated small bowel loop is noted in the right lower quadrant with minimal surrounding inflammatory changes and some fecalization present. The more distal small bowel and ileum is unremarkable. This most likely represents focal inflammation or enteritis. Vascular/Lymphatic: Aortic atherosclerosis. No enlarged abdominal or pelvic lymph nodes. Reproductive: Small calcified uterine fibroid is noted. No adnexal abnormality is noted. Other: No abdominal wall hernia or abnormality. No abdominopelvic ascites. Musculoskeletal: No acute or significant osseous findings. IMPRESSION:  Dilated small bowel loop is seen in the right lower quadrant most consistent with proximal ileum, with minimal surrounding inflammatory changes and some fecalization. This most likely represents focal enteritis or possibly inflammatory bowel disease, although significant wall thickening is not visualized. Sigmoid diverticulosis without inflammation. Aortic Atherosclerosis (ICD10-I70.0). Electronically Signed   By: Marijo Conception M.D.   On: 04/20/2019 08:39   Dg Chest 2 View  Result Date: 04/20/2019 CLINICAL DATA:  Fever and weakness. EXAM: CHEST - 2 VIEW COMPARISON:  Two-view chest x-ray 04/16/2019 FINDINGS: Heart size is exaggerated by low lung  volumes. Is chronic elevation of the right hemidiaphragm. Mild bibasilar opacities are present. Postsurgical changes are noted in the left axilla. The visualized soft tissues and bony thorax are otherwise unremarkable. Atherosclerotic changes are noted in the aorta. IMPRESSION: 1. Lung volumes with mild bibasilar airspace disease, likely atelectasis. 2. Borderline cardiomegaly without failure. 3. Aortic atherosclerosis. Electronically Signed   By: San Morelle M.D.   On: 04/20/2019 06:01   IMPRESSION:  *Abdominal pain/diarrhea:  CT scan shows focal enteritis at the proximal ileum.  ? Infectious vs inflammatory in nature.  ? Improved by imaging as compared to 2 weeks ago.    PLAN: -Continue abx (Zosyn) and supportive care including IVF's, pain control, antiemetics for now. -Await results of stool studies, sed rate, CRP. -Agree with trial of bentyl 20 mg four times daily as this was also ordered by the admitting team.  Laban Emperor. Zehr  04/20/2019, 1:38 PM  ________________________________________________________________________  Velora Heckler GI MD note:  I personally examined the patient, reviewed the data and agree with the assessment and plan described above. I am seeing her for the first time this morning. She has had no BMs, no nausea, no vomiting and no abd pains.  No stool yet to even send for testing. She asked about when she can go home.  Unclear etiology of her enteritis, but very possibly infectious. She's had no GI symptoms since admission, perhaps the Abx are working or perhaps she's just getting over her acute illness regardless of the abx.    I will advance her diet this morning. She is probably safe for d/c later this afternoon if she tolerates. Would continue Abx as an outpatient, Augmentin for 5 days seems a reasonable match with her zosyn.   Will follow along.   Owens Loffler, MD [facility] Pager [phone]

## 2019-04-20 NOTE — ED Notes (Signed)
Report given to Beth, RN

## 2019-04-20 NOTE — ED Notes (Signed)
Pt sonSaralyn Pilar [phone]  Husband- [name] [phone]

## 2019-04-20 NOTE — ED Triage Notes (Signed)
Patient coming from home with complaints of increased weakness over the past month. This has been an ongoing issue for the patient and has lead to several falls over the past month. Patient states that she is not able to stand without assistance and it is difficult for her to walk.

## 2019-04-20 NOTE — Telephone Encounter (Signed)
Palliative Care SW received a call this morning from patient's husband, [name].  He stated patient was in the hospital.  SW left a vm with [name] to provide support.

## 2019-04-20 NOTE — Progress Notes (Signed)
Pharmacy Antibiotic Note  Samantha Mckenzie is a 83 y.o. female recently discharged from [facility] on 7/2 where she was treated with zosyn inpatient and discharged on augmentin for focal small bowel enteritis and suspected microperforation. She presented to the ED on 04/20/2019 with c/o weakness and frequent falls.  Abd CT showed findings consistent with "focal enteritis or possible inflammatory bowel disease."  To start zosyn for intra-abdominal infection.  - scr 3.07 (crcl~12)  Plan: - zosyn 2.25 gm IV q8h - daily scr x3 ____________________________________  Height: 5' 4"  (162.6 cm) Weight: 132 lb (59.9 kg) IBW/kg (Calculated) : 54.7  Temp (24hrs), Avg:99.5 F (37.5 C), Min:97.5 F (36.4 C), Max:101 F (38.3 C)  Recent Labs  Lab 04/16/19 1214 04/20/19 0629  WBC 8.1 7.7  CREATININE 3.11* 3.07*  LATICACIDVEN  --  0.8    Estimated Creatinine Clearance: 12.6 mL/min (A) (by C-G formula based on SCr of 3.07 mg/dL (H)).    Allergies  Allergen Reactions  . Codeine Nausea And Vomiting  . Morphine Nausea And Vomiting  . Oxycodone-Acetaminophen Nausea And Vomiting  . Sulfonamide Derivatives Nausea And Vomiting  . Tramadol Hcl Nausea And Vomiting     Thank you for allowing pharmacy to be a part of this patient's care.  Lynelle Doctor 04/20/2019 12:56 PM

## 2019-04-20 NOTE — ED Notes (Signed)
Patient transported to radiology

## 2019-04-20 NOTE — ED Notes (Signed)
One set of blood cultures drawn and set to lab before starting antibiotics.

## 2019-04-21 DIAGNOSIS — K529 Noninfective gastroenteritis and colitis, unspecified: Secondary | ICD-10-CM

## 2019-04-21 DIAGNOSIS — F329 Major depressive disorder, single episode, unspecified: Secondary | ICD-10-CM

## 2019-04-21 DIAGNOSIS — E871 Hypo-osmolality and hyponatremia: Secondary | ICD-10-CM

## 2019-04-21 LAB — GLUCOSE, CAPILLARY
Glucose-Capillary: 100 mg/dL — ABNORMAL HIGH (ref 70–99)
Glucose-Capillary: 77 mg/dL (ref 70–99)
Glucose-Capillary: 82 mg/dL (ref 70–99)
Glucose-Capillary: 84 mg/dL (ref 70–99)
Glucose-Capillary: 92 mg/dL (ref 70–99)
Glucose-Capillary: 93 mg/dL (ref 70–99)

## 2019-04-21 LAB — COMPREHENSIVE METABOLIC PANEL
ALT: 13 U/L (ref 0–44)
AST: 15 U/L (ref 15–41)
Albumin: 2 g/dL — ABNORMAL LOW (ref 3.5–5.0)
Alkaline Phosphatase: 53 U/L (ref 38–126)
Anion gap: 12 (ref 5–15)
BUN: 48 mg/dL — ABNORMAL HIGH (ref 8–23)
CO2: 27 mmol/L (ref 22–32)
Calcium: 7.9 mg/dL — ABNORMAL LOW (ref 8.9–10.3)
Chloride: 98 mmol/L (ref 98–111)
Creatinine, Ser: 2.52 mg/dL — ABNORMAL HIGH (ref 0.44–1.00)
GFR calc Af Amer: 20 mL/min — ABNORMAL LOW (ref >60)
GFR calc non Af Amer: 17 mL/min — ABNORMAL LOW (ref >60)
Glucose, Bld: 93 mg/dL (ref 70–99)
Potassium: 3.4 mmol/L — ABNORMAL LOW (ref 3.5–5.1)
Sodium: 137 mmol/L (ref 135–145)
Total Bilirubin: 0.5 mg/dL (ref 0.3–1.2)
Total Protein: 5.6 g/dL — ABNORMAL LOW (ref 6.5–8.1)

## 2019-04-21 LAB — CBC WITH DIFFERENTIAL/PLATELET
Abs Immature Granulocytes: 0.12 10*3/uL — ABNORMAL HIGH (ref 0.00–0.07)
Basophils Absolute: 0 10*3/uL (ref 0.0–0.1)
Basophils Relative: 0 %
Eosinophils Absolute: 0.1 10*3/uL (ref 0.0–0.5)
Eosinophils Relative: 2 %
HCT: 25.8 % — ABNORMAL LOW (ref 36.0–46.0)
Hemoglobin: 8.2 g/dL — ABNORMAL LOW (ref 12.0–15.0)
Immature Granulocytes: 2 %
Lymphocytes Relative: 16 %
Lymphs Abs: 0.9 10*3/uL (ref 0.7–4.0)
MCH: 29 pg (ref 26.0–34.0)
MCHC: 31.8 g/dL (ref 30.0–36.0)
MCV: 91.2 fL (ref 80.0–100.0)
Monocytes Absolute: 0.4 10*3/uL (ref 0.1–1.0)
Monocytes Relative: 7 %
Neutro Abs: 4.2 10*3/uL (ref 1.7–7.7)
Neutrophils Relative %: 73 %
Platelets: 327 10*3/uL (ref 150–400)
RBC: 2.83 MIL/uL — ABNORMAL LOW (ref 3.87–5.11)
RDW: 18.4 % — ABNORMAL HIGH (ref 11.5–15.5)
WBC: 5.8 10*3/uL (ref 4.0–10.5)
nRBC: 0 % (ref 0.0–0.2)

## 2019-04-21 MED ORDER — AMOXICILLIN-POT CLAVULANATE 875-125 MG PO TABS: 1.0000 | ORAL_TABLET | Freq: Two times a day (BID) | ORAL | 0 refills | Status: DC

## 2019-04-21 MED ORDER — POTASSIUM CHLORIDE CRYS ER 20 MEQ PO TBCR
40.0000 meq | EXTENDED_RELEASE_TABLET | ORAL | Status: AC
  Administered 2019-04-21 (×2): 40 meq via ORAL
  Filled 2019-04-21 (×2): qty 2

## 2019-04-21 NOTE — Discharge Summary (Signed)
Physician Discharge Summary  Samantha Mckenzie CMK:[id number] DOB: Jul 07, 1936 DOA: 04/20/2019  PCP: Colon Branch, MD  Admit date: 04/20/2019 Discharge date: 04/21/2019  Admitted From: home Disposition:  home   Recommendations for Outpatient Follow-up:  1. PCP to please Bmet and CBC in 1 wk- she had a low sodium and a low Hb noted on this admission    Discharge Condition:  stable CODE STATUS:  DNR   Consultations:  GI    Discharge Diagnoses:  Principal Problem:   Enteritis Active Problems:   Diabetes mellitus type 2 with complications (HCC)   Hyperlipidemia   GOUT   Normocytic anemia   Essential hypertension   GERD   Depression     Brief Summary: Samantha Mckenzie is a 83 y.o. female with medical history significant for hypertension, hyperlipidemia, diabetes-diet-controlled, GERD, gout, depression, breast cancer (status post surgery, radiation and chemotherapy), CKD stage IV, diastolic CHF, presents to the ED complaining of abdominal pain, intermittent nausea/vomiting/diarrhea for the past couple of weeks.   She was recently discharged on 04/12/2019 for similar complaints.  During the last admission CT showed enteritis with possible microperforations, patient was treated with IV antibiotics and discharged home.   Hospital Course:  Enteritis - GI consult requested- GI feels that this is residual enteritis related to her recent episode- GI has advanced her to a regular diet and is recommended to d/c her home today on Augmentin- she has not had any vomiting or diarrhea since being hospitalized  Hyponatremia - due to dehydration- has improved with IVF  Anemia - related to CKD 4- Hb has been steadily dropping since earlier this year- please follow up on this  CKD4 - stable   Discharge Exam: Vitals:   04/21/19 0749 04/21/19 1209  BP: (!) 127/48 (!) 121/49  Pulse: 81 80  Resp: 16 16  Temp: 99.3 F (37.4 C) 98.7 F (37.1 C)  SpO2: 96% 95%   Vitals:   04/20/19 2148  04/21/19 0500 04/21/19 0749 04/21/19 1209  BP: (!) 131/49 (!) 126/54 (!) 127/48 (!) 121/49  Pulse: 79 83 81 80  Resp:  16 16 16   Temp:  99.2 F (37.3 C) 99.3 F (37.4 C) 98.7 F (37.1 C)  TempSrc:  Oral Oral Oral  SpO2:  96% 96% 95%  Weight:      Height:        General: Pt is alert, awake, not in acute distress Cardiovascular: RRR, S1/S2 +, no rubs, no gallops Respiratory: CTA bilaterally, no wheezing, no rhonchi Abdominal: Soft, NT, ND, bowel sounds + Extremities: no edema, no cyanosis   Discharge Instructions  Discharge Instructions    Diet - low sodium heart healthy   Complete by: As directed    Increase activity slowly   Complete by: As directed      Allergies as of 04/21/2019      Reactions   Codeine Nausea And Vomiting   Morphine Nausea And Vomiting   Oxycodone-acetaminophen Nausea And Vomiting   Sulfonamide Derivatives Nausea And Vomiting   Tramadol Hcl Nausea And Vomiting      Medication List    STOP taking these medications   predniSONE 20 MG tablet Commonly known as: Deltasone     TAKE these medications   acetaminophen 325 MG tablet Commonly known as: TYLENOL Take 2 tablets (650 mg total) by mouth every 6 (six) hours as needed for mild pain, fever or headache (or Fever >/= 101).   albuterol (2.5 MG/3ML) 0.083% nebulizer solution Commonly known  as: PROVENTIL Take 3 mLs (2.5 mg total) by nebulization every 4 (four) hours as needed for wheezing or shortness of breath.   allopurinol 100 MG tablet Commonly known as: ZYLOPRIM Take 1 tablet (100 mg total) by mouth daily.   amLODipine 10 MG tablet Commonly known as: NORVASC Take 1 tablet (10 mg total) by mouth daily.   amoxicillin-clavulanate 875-125 MG tablet Commonly known as: Augmentin Take 1 tablet by mouth every 12 (twelve) hours for 7 days.   aspirin 81 MG tablet Take 1 tablet (81 mg total) by mouth daily with breakfast.   atorvastatin 20 MG tablet Commonly known as: LIPITOR Take 1 tablet  (20 mg total) by mouth daily.   CALTRATE 600+D3 PO Take 1 tablet by mouth daily.   CENTRUM SILVER PO Take 1 tablet by mouth daily.   dicyclomine 10 MG capsule Commonly known as: BENTYL Take 2 capsules (20 mg total) by mouth 4 (four) times daily -  before meals and at bedtime.   escitalopram 5 MG tablet Commonly known as: Lexapro 1 tablet daily for 1 week, then 2 tablets daily   furosemide 40 MG tablet Commonly known as: LASIX Take 40 mg by mouth daily. What changed: Another medication with the same name was removed. Continue taking this medication, and follow the directions you see here.   hydrOXYzine 10 MG tablet Commonly known as: ATARAX/VISTARIL Take 1 tablet (10 mg total) by mouth 3 (three) times daily as needed for nausea.   loperamide 2 MG tablet Commonly known as: IMODIUM A-D Take 2 mg by mouth 4 (four) times daily as needed for diarrhea or loose stools.   metoprolol tartrate 25 MG tablet Commonly known as: LOPRESSOR Take 1 tablet (25 mg total) by mouth 2 (two) times daily.   ondansetron 4 MG tablet Commonly known as: ZOFRAN Take 1 tablet (4 mg total) by mouth every 8 (eight) hours as needed for nausea or vomiting.   pantoprazole 40 MG tablet Commonly known as: PROTONIX Take 40 mg by mouth daily.   vitamin B-12 1000 MCG tablet Commonly known as: CYANOCOBALAMIN Take 1,000 mcg by mouth daily.   vitamin C 500 MG tablet Commonly known as: ASCORBIC ACID Take 500 mg by mouth daily.   Vitamin D3 25 MCG (1000 UT) Caps Take 1 capsule by mouth daily.       Allergies  Allergen Reactions  . Codeine Nausea And Vomiting  . Morphine Nausea And Vomiting  . Oxycodone-Acetaminophen Nausea And Vomiting  . Sulfonamide Derivatives Nausea And Vomiting  . Tramadol Hcl Nausea And Vomiting     Procedures/Studies:    Ct Abdomen Pelvis Wo Contrast  Result Date: 04/20/2019 CLINICAL DATA:  Acute generalized abdominal pain. EXAM: CT ABDOMEN AND PELVIS WITHOUT CONTRAST  TECHNIQUE: Multidetector CT imaging of the abdomen and pelvis was performed following the standard protocol without IV contrast. COMPARISON:  CT scan of April 07, 2019. FINDINGS: Lower chest: Mild right basilar subsegmental atelectasis is noted. Hepatobiliary: No focal liver abnormality is seen. No gallstones, gallbladder wall thickening, or biliary dilatation. Pancreas: Unremarkable. No pancreatic ductal dilatation or surrounding inflammatory changes. Spleen: Calcified splenic granulomata are noted. Adrenals/Urinary Tract: Adrenal glands appear normal. Stable bilateral renal cysts are noted. No definite hydronephrosis or renal obstruction is noted. No renal or ureteral calculi are noted. Urinary bladder is unremarkable. Stomach/Bowel: The stomach appears normal. Sigmoid diverticulosis is noted without inflammation. Dilated small bowel loop is noted in the right lower quadrant with minimal surrounding inflammatory changes and some fecalization present.  The more distal small bowel and ileum is unremarkable. This most likely represents focal inflammation or enteritis. Vascular/Lymphatic: Aortic atherosclerosis. No enlarged abdominal or pelvic lymph nodes. Reproductive: Small calcified uterine fibroid is noted. No adnexal abnormality is noted. Other: No abdominal wall hernia or abnormality. No abdominopelvic ascites. Musculoskeletal: No acute or significant osseous findings. IMPRESSION: Dilated small bowel loop is seen in the right lower quadrant most consistent with proximal ileum, with minimal surrounding inflammatory changes and some fecalization. This most likely represents focal enteritis or possibly inflammatory bowel disease, although significant wall thickening is not visualized. Sigmoid diverticulosis without inflammation. Aortic Atherosclerosis (ICD10-I70.0). Electronically Signed   By: Marijo Conception M.D.   On: 04/20/2019 08:39   Ct Abdomen Pelvis Wo Contrast  Result Date: 04/07/2019 CLINICAL DATA:   Weakness, intermittent nausea and diarrhea since 03/25/2019, 4 falls, unspecified abdominal pain, history stage IV chronic renal disease, GERD, hypertension, hyperlipidemia, breast cancer EXAM: CT ABDOMEN AND PELVIS WITHOUT CONTRAST TECHNIQUE: Multidetector CT imaging of the abdomen and pelvis was performed following the standard protocol without IV contrast. Sagittal and coronal MPR images reconstructed from axial data set. No oral contrast was administered COMPARISON:  03/19/2019 FINDINGS: Lower chest: Bibasilar atelectasis with questionable coexistent consolidation RIGHT lower lobe. Hepatobiliary: Gallbladder and liver normal appearance Pancreas: Atrophic pancreas without mass Spleen: Normal size with a few scattered calcified granulomata. Adrenals/Urinary Tract: Adrenal glands normal appearance. Minimally dilated renal pelves bilaterally without hydroureter or ureteral calcification. 2.2 x 2.3 cm exophytic RIGHT renal cyst. Intermediate attenuation 11 mm nodule posterior LEFT kidney image 33 versus lobulated cortex unchanged. Ureters and bladder otherwise unremarkable. Stomach/Bowel: Sigmoid diverticulosis without evidence of diverticulitis. Appendix not definitely visualized. Loop of small bowel in the RIGHT upper lateral pelvis demonstrates mild wall thickening and surrounding inflammatory changes compatible with enteritis. Several tiny foci of gas are seen either within or immediately adjacent to the small bowel wall, cannot exclude microperforation. Small bowel proximal to this is upper normal in caliber and demonstrates a small bowel stool sign suggesting prolonged transit. No definite dilatation/obstruction. Vascular/Lymphatic: Atherosclerotic calcifications aorta and iliac arteries without aneurysm. No adenopathy. Reproductive: Atrophic uterus and ovaries Other: No free air or free fluid.  No hernia. Musculoskeletal: Osseous demineralization. IMPRESSION: Focal inflammatory process of the small bowel loop  in the upper RIGHT lateral pelvis with bowel wall thickening and adjacent infiltrative changes compatible with enteritis. This could be due to infection, focal inflammatory bowel disease, foreign body perforation. Tiny foci of gas within or immediately adjacent to the small bowel wall at this site, cannot exclude micro perforation. No definite bowel dilatation to suggest obstruction, though small bowel stool sign is seen proximal to this indicating prolonged transit. No free air or abscess identified. Findings called to Dr. Tamera Punt on 04/07/2019 at 1722 hrs. Electronically Signed   By: Lavonia Dana M.D.   On: 04/07/2019 17:23   Dg Chest 2 View  Result Date: 04/20/2019 CLINICAL DATA:  Fever and weakness. EXAM: CHEST - 2 VIEW COMPARISON:  Two-view chest x-ray 04/16/2019 FINDINGS: Heart size is exaggerated by low lung volumes. Is chronic elevation of the right hemidiaphragm. Mild bibasilar opacities are present. Postsurgical changes are noted in the left axilla. The visualized soft tissues and bony thorax are otherwise unremarkable. Atherosclerotic changes are noted in the aorta. IMPRESSION: 1. Lung volumes with mild bibasilar airspace disease, likely atelectasis. 2. Borderline cardiomegaly without failure. 3. Aortic atherosclerosis. Electronically Signed   By: San Morelle M.D.   On: 04/20/2019 06:01   Dg  Chest 2 View  Result Date: 04/16/2019 CLINICAL DATA:  Pt states she was dx with pneumonia last week, denies any worse symptoms but still has cough, HTN, hyperlipidemia, GERD, hx breast ca., left mastectomy EXAM: CHEST - 2 VIEW COMPARISON:  04/07/2019 and older exams. FINDINGS: The cardiac silhouette is normal in size and configuration. No mediastinal hilar masses. There is no evidence of adenopathy. Clear lungs.  No pleural effusion or pneumothorax. Postsurgical changes on the left stable. No acute skeletal abnormality. IMPRESSION: No active cardiopulmonary disease. Electronically Signed   By: Lajean Manes M.D.   On: 04/16/2019 17:20   Ct Head Wo Contrast  Result Date: 04/07/2019 CLINICAL DATA:  Weakness, multiple falls EXAM: CT HEAD WITHOUT CONTRAST TECHNIQUE: Contiguous axial images were obtained from the base of the skull through the vertex without intravenous contrast. COMPARISON:  04/04/2019 FINDINGS: Brain: No evidence of acute infarction, hemorrhage, hydrocephalus, extra-axial collection or mass lesion/mass effect. Mild periventricular white matter hypodensity. Vascular: No hyperdense vessel or unexpected calcification. Skull: Normal. Negative for fracture or focal lesion. Sinuses/Orbits: No acute finding. Other: None. IMPRESSION: No acute intracranial pathology. Electronically Signed   By: Eddie Candle M.D.   On: 04/07/2019 17:07   Ct Head Wo Contrast  Result Date: 04/04/2019 CLINICAL DATA:  [place] this morning, hitting back of head on a dresser. EXAM: CT HEAD WITHOUT CONTRAST TECHNIQUE: Contiguous axial images were obtained from the base of the skull through the vertex without intravenous contrast. COMPARISON:  Brain MRI 06/11/2017 FINDINGS: Brain: There is no evidence of acute infarct, intracranial hemorrhage, mass, midline shift, or extra-axial fluid collection. Mild cerebral atrophy is unchanged. Vascular: Calcified atherosclerosis at the skull base. No hyperdense vessel. Skull: No fracture or focal osseous lesion. Sinuses/Orbits: Visualized paranasal sinuses and mastoid air cells are clear. Visualized orbits are unremarkable. Other: Mild posterior scalp swelling and gas with skin staples in place. IMPRESSION: 1. No evidence of acute intracranial abnormality. 2. Mild posterior scalp swelling. Electronically Signed   By: Logan Bores M.D.   On: 04/04/2019 16:54   US Abdomen Complete  Result Date: 03/23/2019 CLINICAL DATA:  Abdominal pain, acute renal injury EXAM: ABDOMEN ULTRASOUND COMPLETE COMPARISON:  03/19/2019 CT of the abdomen and pelvis FINDINGS: Gallbladder: Well distended with  cholelithiasis and gallbladder sludge. No pericholecystic fluid is noted. Negative sonographic Murphy's sign is elicited. Common bile duct: Diameter: 3.9 mm. Liver: No focal lesion identified. Within normal limits in parenchymal echogenicity. Portal vein is patent on color Doppler imaging with normal direction of blood flow towards the liver. IVC: No abnormality visualized. Pancreas: Visualized portion unremarkable. Spleen: Size and appearance within normal limits. Right Kidney: Length: 7.3 cm. 2.5 cm cyst is noted in the midportion of the right kidney stable from the prior CT. Increased echogenicity is noted. Left Kidney: Length: 7.5 cm. No hydronephrosis is noted. Prominent extrarenal pelvis on the left is noted similar to that seen on CT. Increased echogenicity is noted. Abdominal aorta: No aneurysm visualized. Other findings: None. IMPRESSION: Cholelithiasis and gallbladder sludge without complicating factors. Increased echogenicity consistent with medical renal disease. Right renal cyst. Electronically Signed   By: Inez Catalina M.D.   On: 03/23/2019 15:20   Dg Chest Port 1 View  Result Date: 04/07/2019 CLINICAL DATA:  83 year old female with nausea, diarrhea, weakness and frequent falls EXAM: PORTABLE CHEST 1 VIEW COMPARISON:  Prior chest x-ray 03/19/2019 FINDINGS: Stable cardiomegaly. Surgical changes of prior left mastectomy and axillary nodal dissection. Subtle patchy right basilar airspace opacity favored to reflect atelectasis  in the setting of low inspiratory volumes. No pulmonary edema. No large effusion or pneumothorax. No acute osseous abnormality. IMPRESSION: Low inspiratory volumes and stable cardiomegaly. No acute cardiopulmonary process. Electronically Signed   By: Jacqulynn Cadet M.D.   On: 04/07/2019 17:06     The results of significant diagnostics from this hospitalization (including imaging, microbiology, ancillary and laboratory) are listed below for reference.      Microbiology: Recent Results (from the past 240 hour(s))  SARS Coronavirus 2 (CEPHEID - Performed in [place] hospital lab), Hosp Order     Status: None   Collection Time: 04/20/19  8:13 AM   Specimen: Nasopharyngeal Swab  Result Value Ref Range Status   SARS Coronavirus 2 NEGATIVE NEGATIVE Final    Comment: (NOTE) If result is NEGATIVE SARS-CoV-2 target nucleic acids are NOT DETECTED. The SARS-CoV-2 RNA is generally detectable in upper and lower  respiratory specimens during the acute phase of infection. The lowest  concentration of SARS-CoV-2 viral copies this assay can detect is 250  copies / mL. A negative result does not preclude SARS-CoV-2 infection  and should not be used as the sole basis for treatment or other  patient management decisions.  A negative result may occur with  improper specimen collection / handling, submission of specimen other  than nasopharyngeal swab, presence of viral mutation(s) within the  areas targeted by this assay, and inadequate number of viral copies  (<250 copies / mL). A negative result must be combined with clinical  observations, patient history, and epidemiological information. If result is POSITIVE SARS-CoV-2 target nucleic acids are DETECTED. The SARS-CoV-2 RNA is generally detectable in upper and lower  respiratory specimens dur ing the acute phase of infection.  Positive  results are indicative of active infection with SARS-CoV-2.  Clinical  correlation with patient history and other diagnostic information is  necessary to determine patient infection status.  Positive results do  not rule out bacterial infection or co-infection with other viruses. If result is PRESUMPTIVE POSTIVE SARS-CoV-2 nucleic acids MAY BE PRESENT.   A presumptive positive result was obtained on the submitted specimen  and confirmed on repeat testing.  While 2019 novel coronavirus  (SARS-CoV-2) nucleic acids may be present in the submitted sample   additional confirmatory testing may be necessary for epidemiological  and / or clinical management purposes  to differentiate between  SARS-CoV-2 and other Sarbecovirus currently known to infect humans.  If clinically indicated additional testing with an alternate test  methodology [phone]) is advised. The SARS-CoV-2 RNA is generally  detectable in upper and lower respiratory sp ecimens during the acute  phase of infection. The expected result is Negative. Fact Sheet for Patients:  StrictlyIdeas.no Fact Sheet for Healthcare Providers: BankingDealers.co.za This test is not yet approved or cleared by the [place] FDA and has been authorized for detection and/or diagnosis of SARS-CoV-2 by FDA under an Emergency Use Authorization (EUA).  This EUA will remain in effect (meaning this test can be used) for the duration of the COVID-19 declaration under Section 564(b)(1) of the Act, 21 U.S.C. section 360bbb-3(b)(1), unless the authorization is terminated or revoked sooner. Performed at [facility], [place] [address], [place], [place] [address]      Labs: BNP (last 3 results) Recent Labs    04/07/19 2157  BNP 829.9*   Basic Metabolic Panel: Recent Labs  Lab 04/16/19 1214 04/20/19 0629 04/21/19 0509  NA 133* 129* 137  K 3.9 3.7 3.4*  CL 93* 87*  98  CO2 26 27 27   GLUCOSE 173* 149* 93  BUN 45* 50* 48*  CREATININE 3.11* 3.07* 2.52*  CALCIUM 7.5* 8.5* 7.9*   Liver Function Tests: Recent Labs  Lab 04/16/19 1214 04/20/19 0629 04/21/19 0509  AST 15 16 15   ALT 17 14 13   ALKPHOS 64 56 53  BILITOT 0.5 0.6 0.5  PROT 6.8 6.9 5.6*  ALBUMIN 3.1* 2.6* 2.0*   Recent Labs  Lab 04/20/19 0629  LIPASE 31   No results for input(s): AMMONIA in the last 168 hours. CBC: Recent Labs  Lab 04/16/19 1214 04/20/19 0629 04/21/19 0509  WBC 8.1 7.7 5.8  NEUTROABS 7.1 6.0 4.2  HGB 10.0* 9.2* 8.2*  HCT 30.7* 28.2*  25.8*  MCV 89.0 91.0 91.2  PLT 513.0* 404* 327   Cardiac Enzymes: No results for input(s): CKTOTAL, CKMB, CKMBINDEX, TROPONINI in the last 168 hours. BNP: Invalid input(s): POCBNP CBG: Recent Labs  Lab 04/20/19 2107 04/21/19 0002 04/21/19 0456 04/21/19 0851 04/21/19 1204  GLUCAP 92 93 92 100* 82   D-Dimer No results for input(s): DDIMER in the last 72 hours. Hgb A1c No results for input(s): HGBA1C in the last 72 hours. Lipid Profile No results for input(s): CHOL, HDL, LDLCALC, TRIG, CHOLHDL, LDLDIRECT in the last 72 hours. Thyroid function studies No results for input(s): TSH, T4TOTAL, T3FREE, THYROIDAB in the last 72 hours.  Invalid input(s): FREET3 Anemia work up No results for input(s): VITAMINB12, FOLATE, FERRITIN, TIBC, IRON, RETICCTPCT in the last 72 hours. Urinalysis    Component Value Date/Time   COLORURINE YELLOW 04/20/2019 0539   APPEARANCEUR CLEAR 04/20/2019 0539   LABSPEC 1.005 04/20/2019 0539   PHURINE 6.0 04/20/2019 0539   GLUCOSEU NEGATIVE 04/20/2019 0539   HGBUR SMALL (A) 04/20/2019 0539   BILIRUBINUR NEGATIVE 04/20/2019 0539   KETONESUR NEGATIVE 04/20/2019 0539   PROTEINUR NEGATIVE 04/20/2019 0539   UROBILINOGEN 0.2 12/29/2014 1400   NITRITE NEGATIVE 04/20/2019 0539   LEUKOCYTESUR NEGATIVE 04/20/2019 0539   Sepsis Labs Invalid input(s): PROCALCITONIN,  WBC,  LACTICIDVEN Microbiology Recent Results (from the past 240 hour(s))  SARS Coronavirus 2 (CEPHEID - Performed in [place] hospital lab), Hosp Order     Status: None   Collection Time: 04/20/19  8:13 AM   Specimen: Nasopharyngeal Swab  Result Value Ref Range Status   SARS Coronavirus 2 NEGATIVE NEGATIVE Final    Comment: (NOTE) If result is NEGATIVE SARS-CoV-2 target nucleic acids are NOT DETECTED. The SARS-CoV-2 RNA is generally detectable in upper and lower  respiratory specimens during the acute phase of infection. The lowest  concentration of SARS-CoV-2 viral copies this assay can  detect is 250  copies / mL. A negative result does not preclude SARS-CoV-2 infection  and should not be used as the sole basis for treatment or other  patient management decisions.  A negative result may occur with  improper specimen collection / handling, submission of specimen other  than nasopharyngeal swab, presence of viral mutation(s) within the  areas targeted by this assay, and inadequate number of viral copies  (<250 copies / mL). A negative result must be combined with clinical  observations, patient history, and epidemiological information. If result is POSITIVE SARS-CoV-2 target nucleic acids are DETECTED. The SARS-CoV-2 RNA is generally detectable in upper and lower  respiratory specimens dur ing the acute phase of infection.  Positive  results are indicative of active infection with SARS-CoV-2.  Clinical  correlation with patient history and other diagnostic information is  necessary to  determine patient infection status.  Positive results do  not rule out bacterial infection or co-infection with other viruses. If result is PRESUMPTIVE POSTIVE SARS-CoV-2 nucleic acids MAY BE PRESENT.   A presumptive positive result was obtained on the submitted specimen  and confirmed on repeat testing.  While 2019 novel coronavirus  (SARS-CoV-2) nucleic acids may be present in the submitted sample  additional confirmatory testing may be necessary for epidemiological  and / or clinical management purposes  to differentiate between  SARS-CoV-2 and other Sarbecovirus currently known to infect humans.  If clinically indicated additional testing with an alternate test  methodology [phone]) is advised. The SARS-CoV-2 RNA is generally  detectable in upper and lower respiratory sp ecimens during the acute  phase of infection. The expected result is Negative. Fact Sheet for Patients:  StrictlyIdeas.no Fact Sheet for Healthcare  Providers: BankingDealers.co.za This test is not yet approved or cleared by the [place] FDA and has been authorized for detection and/or diagnosis of SARS-CoV-2 by FDA under an Emergency Use Authorization (EUA).  This EUA will remain in effect (meaning this test can be used) for the duration of the COVID-19 declaration under Section 564(b)(1) of the Act, 21 U.S.C. section 360bbb-3(b)(1), unless the authorization is terminated or revoked sooner. Performed at [facility], [place] [address], [place], [place] [address]      Time coordinating discharge in minutes: 76  SIGNED:   Debbe Odea, MD  [facility] 04/21/2019, 12:54 PM Pager   If 7PM-7AM, please contact night-coverage [email or web address] Password [email or web address]

## 2019-04-21 NOTE — Care Management Obs Status (Signed)
[facility] NOTIFICATION   Patient Details  Name: Samantha Mckenzie MRN: [id number] Date of Birth: [birth year]   Medicare Observation Status Notification Given:  Yes    Joaquin Courts, RN 04/21/2019, 1:58 PM

## 2019-04-21 NOTE — Plan of Care (Signed)
Patient lying in bed this morning; pain and nausea controlled. No other concerns noted at this time. Will continue to monitor.

## 2019-04-21 NOTE — Progress Notes (Signed)
Patient ID: MEZTLI LLANAS, female   DOB: Aug 09, 1936, 83 y.o.   MRN: [id number]  [facility]  Chart reviewed.  Reviewed notes from this AM by gastroenterology.  Discussed with Dr. Wynelle Cleveland.  Surgery will sign off.  Call if we may be of assistance.  Armandina Gemma, [facility] Office: [phone]

## 2019-04-21 NOTE — Progress Notes (Signed)
PHARMACY NOTE -  [facility] has been assisting with dosing of Zosyn for IAI.  Dosage remains stable at 2.25 g IV q8 hr and need for further dosage adjustment appears unlikely at present given CrCl < 20 ml/min at baseline  Pharmacy will sign off, following peripherally for culture results or dose adjustments. Please reconsult if a change in clinical status warrants re-evaluation of dosage.  Reuel Boom, PharmD, BCPS [phone] 04/21/2019, 10:27 AM

## 2019-04-21 NOTE — Progress Notes (Signed)
    Home health agencies that serve [phone].        [place] Quality of Patient Care Rating Patient Survey Summary Rating  [facility] [phone] 3 out of 5 stars 4 out of [place] [phone] 4  out of 5 stars 3 out of [place] [phone] 4 out of 5 stars 4 out of [place] [phone] 4 out of 5 stars 4 out of [place] [phone] 4  out of 5 stars 4 out of [place] [phone] 4 out of 5 stars 4 out of 5 stars  [facility] [phone] 3  out of 5 stars 4 out of [place] [phone] 3 out of 5 stars 4 out of 5 stars  [facility] [phone] 4 out of 5 stars Not Available12  [facility] (336) [phone] 3  out of 5 stars 3 out of [place] [phone] 3  out of 5 stars 4 out of [place] [phone] 3  out of 5 stars 3 out of [place] [phone] 3  out of 5 stars Not [place] [phone] 4  out of 5 stars 3 out of [place] number Footnote as displayed on [place]  1 This agency provides services under a federal waiver program to non-traditional, chronic long term population.  2 This agency provides services to a special needs population.  3 Not Available.  4 The number of patient episodes for this measure is too small to report.  5 This measure currently does not have data or provider has been certified/recertified for less than 6 months.  6 The national average for this measure is not provided because of state-to-state differences in data collection.  7 Medicare is not displaying rates for this  measure for any home health agency, because of an issue with the data.  8 There were problems with the data and they are being corrected.  9 Zero, or very few, patients met the survey's rules for inclusion. The scores shown, if any, reflect a very small number of surveys and may not accurately tell how an agency is doing.  10 Survey results are based on less than 12 months of data.  11 Fewer than 70 patients completed the survey. Use the scores shown, if any, with caution as the number of surveys may be too low to accurately tell how an agency is doing.  12 No survey results are available for this period.  13 Data suppressed by CMS for one or more quarters.

## 2019-04-21 NOTE — TOC Initial Note (Signed)
Transition of Care [facility]) - Initial/Assessment Note    Patient Details  Name: Samantha Mckenzie MRN: [id number] Date of Birth: 1936-06-28  Transition of Care (TOC) CM/SW Contact:    Joaquin Courts, RN Phone Number: 04/21/2019, 2:22 PM  Clinical Narrative:    CM spoke with patient at bedside. Confirmed patient was active with [facility] prior to admit for [facility] RN and aide. MD messaged for resumption of care orders at d/c.                 Expected Discharge Plan: [place] Barriers to Discharge: No Barriers Identified   Patient Goals and CMS Choice Patient states their goals for this hospitalization and ongoing recovery are:: to go home      Expected Discharge Plan and Services Expected Discharge Plan: [place]   Discharge Planning Services: CM Consult   Living arrangements for the past 2 months: Single Family Home Expected Discharge Date: 04/21/19               DME Arranged: N/A DME Agency: NA       HH Arranged: NA [place] Agency: NA        Prior Living Arrangements/Services Living arrangements for the past 2 months: Single Family Home   Patient language and need for interpreter reviewed:: Yes Do you feel safe going back to the place where you live?: Yes      Need for Family Participation in Patient Care: Yes (Comment) Care giver support system in place?: Yes (comment)   Criminal Activity/Legal Involvement Pertinent to Current Situation/Hospitalization: No - Comment as needed  Activities of Daily Living Home Assistive Devices/Equipment: Walker (specify type), Eyeglasses, CBG Meter(walk-in-shower, front wheeled walker, 4 wheeled walker) ADL Screening (condition at time of admission) Patient's cognitive ability adequate to safely complete daily activities?: Yes Is the patient deaf or have difficulty hearing?: No Does the patient have difficulty seeing, even when wearing glasses/contacts?: Yes(has macular degeneration) Does the patient  have difficulty concentrating, remembering, or making decisions?: No Patient able to express need for assistance with ADLs?: Yes Does the patient have difficulty dressing or bathing?: Yes Independently performs ADLs?: No Communication: Independent Dressing (OT): Needs assistance Is this a change from baseline?: Change from baseline, expected to last >3 days Grooming: Needs assistance Is this a change from baseline?: Change from baseline, expected to last >3 days Feeding: Needs assistance Is this a change from baseline?: Change from baseline, expected to last >3 days Bathing: Needs assistance Is this a change from baseline?: Change from baseline, expected to last >3 days Toileting: Dependent Is this a change from baseline?: Change from baseline, expected to last >3days In/Out Bed: Dependent Is this a change from baseline?: Change from baseline, expected to last >3 days Walks in Home: Dependent Is this a change from baseline?: Change from baseline, expected to last >3 days Does the patient have difficulty walking or climbing stairs?: Yes(secondary to weakness) Weakness of Legs: Both Weakness of Arms/Hands: Both  Permission Sought/Granted                  Emotional Assessment Appearance:: Appears stated age Attitude/Demeanor/Rapport: Engaged Affect (typically observed): Accepting Orientation: : Oriented to  Time, Oriented to Situation, Oriented to Place, Oriented to Self   Psych Involvement: No (comment)  Admission diagnosis:  Abdominal pain, unspecified abdominal location [R10.9] Patient Active Problem List   Diagnosis Date Noted  . COPD (chronic obstructive pulmonary disease) ([place]) 04/12/2019  . Enteritis 04/07/2019  .  Chronic diastolic CHF (congestive heart failure) ([place]) 04/07/2019  . Hypokalemia 04/07/2019  . Hypocalcemia 04/07/2019  . Fall 04/07/2019  . Occult blood positive stool 04/07/2019  . Adjustment disorder with depressed mood   . Generalized abdominal pain    . Dysphasia 03/21/2019  . CAP (community acquired pneumonia) 03/20/2019  . Nausea vomiting and diarrhea 03/20/2019  . Acute respiratory failure with hypoxia ([place]) 03/20/2019  . Acute renal failure superimposed on stage 4 chronic kidney disease ([place]) 03/20/2019  . Lactic acidosis 03/20/2019  . Diabetic peripheral neuropathy associated with type 2 diabetes mellitus ([place]) 03/20/2019  . Hypomagnesemia 06/21/2018  . Generalized weakness 06/20/2018  . PCP NOTES >>> 07/11/2015  . Depression   . VITAMIN B12 DEFICIENCY 04/01/2008  . GERD 12/19/2007  . NEOP, MALIGNANT, FEMALE BREAST NOS 07/07/2007  . Osteoarthritis 07/07/2007  . Diabetes mellitus type 2 with complications ([place]) 70/10/7492  . Hyperlipidemia 02/28/2007  . GOUT 02/28/2007  . Normocytic anemia 02/28/2007  . Essential hypertension 02/28/2007   PCP:  Colon Branch, MD Pharmacy:   [place], [place] [address] [place] [address] Phone: [phone] Fax: [place] [address] [place]. [place]. [place] [address] Phone: [phone] Fax: [phone]     Social Determinants of Health (SDOH) Interventions    Readmission Risk Interventions Readmission Risk Prevention Plan 04/10/2019 03/26/2019  Transportation Screening Complete Complete  PCP or Specialist Appt within 3-5 Days - Complete  HRI or [place] - Complete  Social Work Consult for [place] Planning/Counseling - Complete  Palliative Care Screening - Not Applicable  Medication Review Press photographer) Referral to Pharmacy Complete  PCP or Specialist appointment within 3-5 days of discharge Complete -  [place] or Home Care Consult Complete -  SW Recovery Care/Counseling Consult Complete -  Palliative Care Screening Not Applicable -  [place] Not Applicable -  Some recent data might be hidden

## 2019-04-22 DIAGNOSIS — Z66 Do not resuscitate: Secondary | ICD-10-CM | POA: Diagnosis present

## 2019-04-22 DIAGNOSIS — M109 Gout, unspecified: Secondary | ICD-10-CM | POA: Diagnosis present

## 2019-04-22 DIAGNOSIS — R109 Unspecified abdominal pain: Secondary | ICD-10-CM

## 2019-04-22 DIAGNOSIS — E1122 Type 2 diabetes mellitus with diabetic chronic kidney disease: Secondary | ICD-10-CM | POA: Diagnosis present

## 2019-04-22 DIAGNOSIS — D649 Anemia, unspecified: Secondary | ICD-10-CM | POA: Diagnosis not present

## 2019-04-22 DIAGNOSIS — Z7982 Long term (current) use of aspirin: Secondary | ICD-10-CM | POA: Diagnosis not present

## 2019-04-22 DIAGNOSIS — E871 Hypo-osmolality and hyponatremia: Secondary | ICD-10-CM | POA: Diagnosis present

## 2019-04-22 DIAGNOSIS — Z7952 Long term (current) use of systemic steroids: Secondary | ICD-10-CM | POA: Diagnosis not present

## 2019-04-22 DIAGNOSIS — E785 Hyperlipidemia, unspecified: Secondary | ICD-10-CM | POA: Diagnosis present

## 2019-04-22 DIAGNOSIS — K529 Noninfective gastroenteritis and colitis, unspecified: Secondary | ICD-10-CM | POA: Diagnosis not present

## 2019-04-22 DIAGNOSIS — Z853 Personal history of malignant neoplasm of breast: Secondary | ICD-10-CM | POA: Diagnosis not present

## 2019-04-22 DIAGNOSIS — K5000 Crohn's disease of small intestine without complications: Secondary | ICD-10-CM | POA: Diagnosis not present

## 2019-04-22 DIAGNOSIS — Z7401 Bed confinement status: Secondary | ICD-10-CM | POA: Diagnosis not present

## 2019-04-22 DIAGNOSIS — K219 Gastro-esophageal reflux disease without esophagitis: Secondary | ICD-10-CM | POA: Diagnosis present

## 2019-04-22 DIAGNOSIS — K50019 Crohn's disease of small intestine with unspecified complications: Secondary | ICD-10-CM | POA: Diagnosis present

## 2019-04-22 DIAGNOSIS — E860 Dehydration: Secondary | ICD-10-CM | POA: Diagnosis present

## 2019-04-22 DIAGNOSIS — I959 Hypotension, unspecified: Secondary | ICD-10-CM | POA: Diagnosis not present

## 2019-04-22 DIAGNOSIS — I130 Hypertensive heart and chronic kidney disease with heart failure and stage 1 through stage 4 chronic kidney disease, or unspecified chronic kidney disease: Secondary | ICD-10-CM | POA: Diagnosis present

## 2019-04-22 DIAGNOSIS — N184 Chronic kidney disease, stage 4 (severe): Secondary | ICD-10-CM | POA: Diagnosis present

## 2019-04-22 DIAGNOSIS — Z9012 Acquired absence of left breast and nipple: Secondary | ICD-10-CM | POA: Diagnosis not present

## 2019-04-22 DIAGNOSIS — D631 Anemia in chronic kidney disease: Secondary | ICD-10-CM | POA: Diagnosis present

## 2019-04-22 DIAGNOSIS — F329 Major depressive disorder, single episode, unspecified: Secondary | ICD-10-CM | POA: Diagnosis not present

## 2019-04-22 DIAGNOSIS — Z923 Personal history of irradiation: Secondary | ICD-10-CM | POA: Diagnosis not present

## 2019-04-22 DIAGNOSIS — Z79899 Other long term (current) drug therapy: Secondary | ICD-10-CM | POA: Diagnosis not present

## 2019-04-22 DIAGNOSIS — E118 Type 2 diabetes mellitus with unspecified complications: Secondary | ICD-10-CM | POA: Diagnosis not present

## 2019-04-22 DIAGNOSIS — I5032 Chronic diastolic (congestive) heart failure: Secondary | ICD-10-CM | POA: Diagnosis present

## 2019-04-22 DIAGNOSIS — I10 Essential (primary) hypertension: Secondary | ICD-10-CM | POA: Diagnosis not present

## 2019-04-22 DIAGNOSIS — M2550 Pain in unspecified joint: Secondary | ICD-10-CM | POA: Diagnosis not present

## 2019-04-22 DIAGNOSIS — Z1159 Encounter for screening for other viral diseases: Secondary | ICD-10-CM | POA: Diagnosis not present

## 2019-04-22 DIAGNOSIS — Z803 Family history of malignant neoplasm of breast: Secondary | ICD-10-CM | POA: Diagnosis not present

## 2019-04-22 DIAGNOSIS — Z9221 Personal history of antineoplastic chemotherapy: Secondary | ICD-10-CM | POA: Diagnosis not present

## 2019-04-22 LAB — GLUCOSE, CAPILLARY
Glucose-Capillary: 103 mg/dL — ABNORMAL HIGH (ref 70–99)
Glucose-Capillary: 111 mg/dL — ABNORMAL HIGH (ref 70–99)
Glucose-Capillary: 112 mg/dL — ABNORMAL HIGH (ref 70–99)
Glucose-Capillary: 123 mg/dL — ABNORMAL HIGH (ref 70–99)
Glucose-Capillary: 127 mg/dL — ABNORMAL HIGH (ref 70–99)
Glucose-Capillary: 92 mg/dL (ref 70–99)

## 2019-04-22 LAB — CREATININE, SERUM
Creatinine, Ser: 2.42 mg/dL — ABNORMAL HIGH (ref 0.44–1.00)
GFR calc Af Amer: 21 mL/min — ABNORMAL LOW (ref >60)
GFR calc non Af Amer: 18 mL/min — ABNORMAL LOW (ref >60)

## 2019-04-22 MED ORDER — INSULIN ASPART 100 UNIT/ML ~~LOC~~ SOLN: 0.0000 [IU] | Freq: Three times a day (TID) | SUBCUTANEOUS | Status: DC

## 2019-04-22 MED ORDER — ONDANSETRON HCL 4 MG/2ML IJ SOLN
4.0000 mg | Freq: Four times a day (QID) | INTRAMUSCULAR | Status: DC
  Administered 2019-04-22 – 2019-04-24 (×7): 4 mg via INTRAVENOUS
  Filled 2019-04-22 (×8): qty 2

## 2019-04-22 NOTE — Plan of Care (Signed)
Patient lying in bed this morning; pain controlled. Patient does not feel well this morning; states she feels weak and has no appetite. Discussed physical therapy consult that should take place today and she is agreeable to plan. Will continue to monitor.

## 2019-04-22 NOTE — Evaluation (Signed)
Physical Therapy Evaluation Patient Details Name: Samantha Mckenzie MRN: [id number] DOB: 31-Jan-1936 Today's Date: 04/22/2019   History of Present Illness  Pt admitted from home wiht weakness and enteritis.  Pt with hx of falls, CKD, breast CA, COPD and DM  Clinical Impression  Pt admitted as above and presenting with functional mobility limitations 2* generalized weakness, balance deficits, limited endurance and questionable safety awareness.  Unless 24/7 assist becomes available at home (spouse is present but can not assist per pt) pt would benefit from follow up [facility] SNF level rehab to maximize IND and safety prior to return home with limited assist.    Follow Up Recommendations SNF    Equipment Recommendations  None recommended by PT    Recommendations for Other Services OT consult     Precautions / Restrictions Precautions Precautions: Fall Restrictions Weight Bearing Restrictions: No      Mobility  Bed Mobility Overal bed mobility: Needs Assistance Bed Mobility: Supine to Sit     Supine to sit: Min guard;Modified independent (Device/Increase time) Sit to supine: Modified independent (Device/Increase time)   General bed mobility comments: Increased time with use of bed rail  Transfers Overall transfer level: Needs assistance Equipment used: Rolling walker (2 wheeled) Transfers: Sit to/from Stand Sit to Stand: Min assist         General transfer comment: cues for transition position and use of UEs to self assist  Ambulation/Gait Ambulation/Gait assistance: Min assist Gait Distance (Feet): 100 Feet Assistive device: Rolling walker (2 wheeled) Gait Pattern/deviations: Step-through pattern;Decreased step length - right;Decreased step length - left;Shuffle;Trunk flexed;Narrow base of support Gait velocity: Decreased   General Gait Details: cues for posture, position from RW and saftey.  Min assist/min guard assist for RW management and pt safety stability.  Pt  demonstrating mild general instabillity all directions increasing with fatigue but no overt LOB  Stairs            Wheelchair Mobility    Modified Rankin (Stroke Patients Only)       Balance Overall balance assessment: Needs assistance Sitting-balance support: No upper extremity supported;Feet supported Sitting balance-Leahy Scale: Good     Standing balance support: Bilateral upper extremity supported Standing balance-Leahy Scale: Poor                               Pertinent Vitals/Pain Pain Assessment: No/denies pain    Home Living Family/patient expects to be discharged to:: Unsure Living Arrangements: Spouse/significant other Available Help at Discharge: Family;Available PRN/intermittently Type of Home: House Home Access: Stairs to enter   [employer] of Steps: 2-3 Home Layout: One level Home Equipment: Walker - 2 wheels;Walker - 4 wheels Additional Comments: Pt states husband can not assist    Prior Function Level of Independence: Independent with assistive device(s)         Comments: Pt reports using RW since last hospital admission     Hand Dominance        Extremity/Trunk Assessment   Upper Extremity Assessment Upper Extremity Assessment: Generalized weakness    Lower Extremity Assessment Lower Extremity Assessment: Generalized weakness       Communication   Communication: HOH  Cognition Arousal/Alertness: Awake/alert Behavior During Therapy: WFL for tasks assessed/performed Overall Cognitive Status: Within Functional Limits for tasks assessed  General Comments      Exercises     Assessment/Plan    PT Assessment Patient needs continued PT services  PT Problem List Decreased strength;Decreased activity tolerance;Decreased balance;Decreased mobility;Decreased knowledge of use of DME;Decreased safety awareness       PT Treatment Interventions DME  instruction;Gait training;Functional mobility training;Therapeutic activities;Therapeutic exercise;Stair training;Balance training;Patient/family education    PT Goals (Current goals can be found in the Care Plan section)  Acute Rehab PT Goals Patient Stated Goal: To get some rest PT Goal Formulation: With patient Time For Goal Achievement: 04/22/19 Potential to Achieve Goals: Fair    Frequency Min 3X/week   Barriers to discharge Decreased caregiver support      Co-evaluation               AM-PAC PT "6 Clicks" Mobility  Outcome Measure Help needed turning from your back to your side while in a flat bed without using bedrails?: None Help needed moving from lying on your back to sitting on the side of a flat bed without using bedrails?: None Help needed moving to and from a bed to a chair (including a wheelchair)?: A Little Help needed standing up from a chair using your arms (e.g., wheelchair or bedside chair)?: A Little Help needed to walk in hospital room?: A Little Help needed climbing 3-5 steps with a railing? : A Lot 6 Click Score: 19    End of Session Equipment Utilized During Treatment: Gait belt Activity Tolerance: Patient limited by fatigue Patient left: in bed;with call bell/phone within reach;with bed alarm set Nurse Communication: Mobility status PT Visit Diagnosis: Muscle weakness (generalized) (M62.81);Unsteadiness on feet (R26.81)    Time: 4451-4604 PT Time Calculation (min) (ACUTE ONLY): 18 min   Charges:   PT Evaluation $PT Eval Low Complexity: 1 Low          [place] Pager [phone] Office [phone]   Caylin Nass 04/22/2019, 3:23 PM

## 2019-04-22 NOTE — Progress Notes (Signed)
PROGRESS NOTE    Samantha Mckenzie   RDE:[id number]  DOB: [birth year]  DOA: 04/20/2019 PCP: Colon Branch, MD   Brief Narrative:  Samantha Mckenzie is a 83 y.o.femalewith medical history significant forhypertension, hyperlipidemia, diabetes-diet-controlled, GERD, gout, depression, breast cancer(status post surgery, radiation and chemotherapy),CKD stage IV, diastolic CHF, presents to the ED complaining of abdominal pain, intermittent nausea/vomiting/diarrhea for the past couple of weeks.  She was recently discharged on 04/12/2019 for similar complaints. During the last admission CT showed enteritis with possible microperforations, patient was treated with IV antibiotics and discharged home.    Subjective: Nausea and abdominal pain. No appetite. 2 "diarrhea" stools since yesterday.     Assessment & Plan:   Enteritis- terminal ileitis  - GI consult requested- GI feels that this is residual enteritis related to her recent episode- GI has advanced her to a regular diet and is recommended to d/c her home  on Augmentin- she is currently unable to be discharged as she remains symptomatic- cont Zosyn - she is now having nausea- she has had 2 loose stools since yesterday- does not want to eat anything.  - will need to continue IVF to prevent dehydrating - increase Zofran to QID routine for nausea - cont Bentyl  Hyponatremia - due to dehydration- has improved with IVF  Anemia - related to CKD 4- Hb has been steadily dropping since earlier this year-    CKD4 - stable  Time spent in minutes: 35 min DVT prophylaxis: Heparin Code Status: DNR Family Communication:  Disposition Plan: f/u on PT eval- she may need SNF Consultants:   GI Procedures:   none Antimicrobials:  Anti-infectives (From admission, onward)   Start     Dose/Rate Route Frequency Ordered Stop   04/21/19 0000  amoxicillin-clavulanate (AUGMENTIN) 875-125 MG tablet     1 tablet Oral Every 12 hours 04/21/19 1253  04/28/19 2359   04/20/19 1400  piperacillin-tazobactam (ZOSYN) IVPB 2.25 g     2.25 g 100 mL/hr over 30 Minutes Intravenous Every 8 hours 04/20/19 1310     04/20/19 0600  cefTRIAXone (ROCEPHIN) 2 g in sodium chloride 0.9 % 100 mL IVPB     2 g 200 mL/hr over 30 Minutes Intravenous  Once 04/20/19 0545 04/20/19 0708   04/20/19 0600  metroNIDAZOLE (FLAGYL) IVPB 500 mg     500 mg 100 mL/hr over 60 Minutes Intravenous  Once 04/20/19 0545 04/20/19 0827       Objective: Vitals:   04/21/19 1209 04/21/19 1400 04/21/19 2100 04/22/19 0514  BP: (!) 121/49 (!) 116/47 (!) 116/53 (!) 112/45  Pulse: 80 74 75 79  Resp: 16 16 18 16   Temp: 98.7 F (37.1 C) 98.6 F (37 C) 99.2 F (37.3 C) 98.5 F (36.9 C)  TempSrc: Oral Oral Oral Oral  SpO2: 95% 96% 97% 97%  Weight:      Height:        Intake/Output Summary (Last 24 hours) at 04/22/2019 1316 Last data filed at 04/22/2019 1224 Gross per 24 hour  Intake 2389.11 ml  Output 1975 ml  Net 414.11 ml   Filed Weights   04/20/19 0453  Weight: 59.9 kg    Examination: General exam: Appears comfortable  HEENT: PERRLA, oral mucosa moist, no sclera icterus or thrush Respiratory system: Clear to auscultation. Respiratory effort normal. Cardiovascular system: S1 & S2 heard, RRR.   Gastrointestinal system: Abdomen soft,  Tender in mid and lower abdomen, nondistended. Normal bowel sounds. Central nervous system: Alert and oriented.  No focal neurological deficits. Extremities: No cyanosis, clubbing or edema Skin: No rashes or ulcers Psychiatry:  Appears depressed.    Data Reviewed: I have personally reviewed following labs and imaging studies  CBC: Recent Labs  Lab 04/16/19 1214 04/20/19 0629 04/21/19 0509  WBC 8.1 7.7 5.8  NEUTROABS 7.1 6.0 4.2  HGB 10.0* 9.2* 8.2*  HCT 30.7* 28.2* 25.8*  MCV 89.0 91.0 91.2  PLT 513.0* 404* 585   Basic Metabolic Panel: Recent Labs  Lab 04/16/19 1214 04/20/19 0629 04/21/19 0509 04/22/19 0427  NA  133* 129* 137  --   K 3.9 3.7 3.4*  --   CL 93* 87* 98  --   CO2 26 27 27   --   GLUCOSE 173* 149* 93  --   BUN 45* 50* 48*  --   CREATININE 3.11* 3.07* 2.52* 2.42*  CALCIUM 7.5* 8.5* 7.9*  --    GFR: Estimated Creatinine Clearance: 16 mL/min (A) (by C-G formula based on SCr of 2.42 mg/dL (H)). Liver Function Tests: Recent Labs  Lab 04/16/19 1214 04/20/19 0629 04/21/19 0509  AST 15 16 15   ALT 17 14 13   ALKPHOS 64 56 53  BILITOT 0.5 0.6 0.5  PROT 6.8 6.9 5.6*  ALBUMIN 3.1* 2.6* 2.0*   Recent Labs  Lab 04/20/19 0629  LIPASE 31   No results for input(s): AMMONIA in the last 168 hours. Coagulation Profile: No results for input(s): INR, PROTIME in the last 168 hours. Cardiac Enzymes: No results for input(s): CKTOTAL, CKMB, CKMBINDEX, TROPONINI in the last 168 hours. BNP (last 3 results) No results for input(s): PROBNP in the last 8760 hours. HbA1C: No results for input(s): HGBA1C in the last 72 hours. CBG: Recent Labs  Lab 04/21/19 1936 04/22/19 0008 04/22/19 0511 04/22/19 0728 04/22/19 1219  GLUCAP 77 123* 103* 92 111*   Lipid Profile: No results for input(s): CHOL, HDL, LDLCALC, TRIG, CHOLHDL, LDLDIRECT in the last 72 hours. Thyroid Function Tests: No results for input(s): TSH, T4TOTAL, FREET4, T3FREE, THYROIDAB in the last 72 hours. Anemia Panel: No results for input(s): VITAMINB12, FOLATE, FERRITIN, TIBC, IRON, RETICCTPCT in the last 72 hours. Urine analysis:    Component Value Date/Time   COLORURINE YELLOW 04/20/2019 [place] 04/20/2019 0539   LABSPEC 1.005 04/20/2019 0539   PHURINE 6.0 04/20/2019 0539   GLUCOSEU NEGATIVE 04/20/2019 0539   HGBUR SMALL (A) 04/20/2019 0539   BILIRUBINUR NEGATIVE 04/20/2019 0539   KETONESUR NEGATIVE 04/20/2019 0539   PROTEINUR NEGATIVE 04/20/2019 0539   UROBILINOGEN 0.2 12/29/2014 1400   NITRITE NEGATIVE 04/20/2019 0539   LEUKOCYTESUR NEGATIVE 04/20/2019 0539   Sepsis Labs:  @[email or web address] (procalcitonin:4,lacticidven:4) ) Recent Results (from the past 240 hour(s))  SARS Coronavirus 2 (CEPHEID - Performed in [place] hospital lab), Hosp Order     Status: None   Collection Time: 04/20/19  8:13 AM   Specimen: Nasopharyngeal Swab  Result Value Ref Range Status   SARS Coronavirus 2 NEGATIVE NEGATIVE Final    Comment: (NOTE) If result is NEGATIVE SARS-CoV-2 target nucleic acids are NOT DETECTED. The SARS-CoV-2 RNA is generally detectable in upper and lower  respiratory specimens during the acute phase of infection. The lowest  concentration of SARS-CoV-2 viral copies this assay can detect is 250  copies / mL. A negative result does not preclude SARS-CoV-2 infection  and should not be used as the sole basis for treatment or other  patient management decisions.  A negative result may occur with  improper specimen collection /  handling, submission of specimen other  than nasopharyngeal swab, presence of viral mutation(s) within the  areas targeted by this assay, and inadequate number of viral copies  (<250 copies / mL). A negative result must be combined with clinical  observations, patient history, and epidemiological information. If result is POSITIVE SARS-CoV-2 target nucleic acids are DETECTED. The SARS-CoV-2 RNA is generally detectable in upper and lower  respiratory specimens dur ing the acute phase of infection.  Positive  results are indicative of active infection with SARS-CoV-2.  Clinical  correlation with patient history and other diagnostic information is  necessary to determine patient infection status.  Positive results do  not rule out bacterial infection or co-infection with other viruses. If result is PRESUMPTIVE POSTIVE SARS-CoV-2 nucleic acids MAY BE PRESENT.   A presumptive positive result was obtained on the submitted specimen  and confirmed on repeat testing.  While 2019 novel coronavirus  (SARS-CoV-2) nucleic acids may be present in the  submitted sample  additional confirmatory testing may be necessary for epidemiological  and / or clinical management purposes  to differentiate between  SARS-CoV-2 and other Sarbecovirus currently known to infect humans.  If clinically indicated additional testing with an alternate test  methodology [phone]) is advised. The SARS-CoV-2 RNA is generally  detectable in upper and lower respiratory sp ecimens during the acute  phase of infection. The expected result is Negative. Fact Sheet for Patients:  StrictlyIdeas.no Fact Sheet for Healthcare Providers: BankingDealers.co.za This test is not yet approved or cleared by the [place] FDA and has been authorized for detection and/or diagnosis of SARS-CoV-2 by FDA under an Emergency Use Authorization (EUA).  This EUA will remain in effect (meaning this test can be used) for the duration of the COVID-19 declaration under Section 564(b)(1) of the Act, 21 U.S.C. section 360bbb-3(b)(1), unless the authorization is terminated or revoked sooner. Performed at [facility], [place] [address] [place], [place] [address]   Culture, blood (routine x 2)     Status: None (Preliminary result)   Collection Time: 04/20/19  2:02 PM   Specimen: BLOOD LEFT HAND  Result Value Ref Range Status   Specimen Description   Final    BLOOD LEFT HAND Performed at [facility] [address] [place], [place] [address]    Special Requests   Final    BOTTLES DRAWN AEROBIC ONLY Blood Culture adequate volume Performed at [facility] [address] [place], [place] [address]    Culture   Final    NO GROWTH 1 DAY Performed at [facility], [place] [address] [place], [place] [address]    Report Status PENDING  Incomplete  Culture, blood (routine x 2)     Status: None (Preliminary result)   Collection Time: 04/20/19  2:02 PM   Specimen: BLOOD  Result  Value Ref Range Status   Specimen Description   Final    BLOOD LEFT ANTECUBITAL Performed at [facility] [address] [place], [place] [address]    Special Requests   Final    BOTTLES DRAWN AEROBIC ONLY Blood Culture adequate volume Performed at [facility] [address] [place], [place] [address]    Culture   Final    NO GROWTH 1 DAY Performed at [facility], [place] [address] [place],  [address]    Report Status PENDING  Incomplete         Radiology Studies: No results found.  Scheduled Meds: . acetaminophen  650 mg Oral Once  . allopurinol  100 mg Oral Daily  . dicyclomine  20 mg Oral TID AC & HS  . escitalopram  5 mg Oral QHS  . heparin  5,000 Units Subcutaneous Q8H  . insulin aspart  0-9 Units Subcutaneous Q4H  . ondansetron (ZOFRAN) IV  4 mg Intravenous Q6H  . pantoprazole  40 mg Oral Daily   Continuous Infusions: . sodium chloride 100 mL/hr at 04/22/19 0700  . piperacillin-tazobactam (ZOSYN)  IV 2.25 g (04/22/19 0558)     LOS: 0 days      Debbe Odea, MD [facility] Pager: [email or web address] Password [email or web address] 04/22/2019, 1:16 PM

## 2019-04-23 ENCOUNTER — Telehealth: Payer: Self-pay | Admitting: Internal Medicine

## 2019-04-23 LAB — BASIC METABOLIC PANEL
Anion gap: 11 (ref 5–15)
BUN: 26 mg/dL — ABNORMAL HIGH (ref 8–23)
CO2: 23 mmol/L (ref 22–32)
Calcium: 8 mg/dL — ABNORMAL LOW (ref 8.9–10.3)
Chloride: 110 mmol/L (ref 98–111)
Creatinine, Ser: 2.25 mg/dL — ABNORMAL HIGH (ref 0.44–1.00)
GFR calc Af Amer: 23 mL/min — ABNORMAL LOW (ref >60)
GFR calc non Af Amer: 20 mL/min — ABNORMAL LOW (ref >60)
Glucose, Bld: 122 mg/dL — ABNORMAL HIGH (ref 70–99)
Potassium: 3.9 mmol/L (ref 3.5–5.1)
Sodium: 144 mmol/L (ref 135–145)

## 2019-04-23 LAB — GLUCOSE, CAPILLARY
Glucose-Capillary: 105 mg/dL — ABNORMAL HIGH (ref 70–99)
Glucose-Capillary: 106 mg/dL — ABNORMAL HIGH (ref 70–99)
Glucose-Capillary: 108 mg/dL — ABNORMAL HIGH (ref 70–99)
Glucose-Capillary: 114 mg/dL — ABNORMAL HIGH (ref 70–99)
Glucose-Capillary: 116 mg/dL — ABNORMAL HIGH (ref 70–99)
Glucose-Capillary: 116 mg/dL — ABNORMAL HIGH (ref 70–99)
Glucose-Capillary: 94 mg/dL (ref 70–99)

## 2019-04-23 MED ORDER — ONDANSETRON 4 MG PO TBDP: 4.0000 mg | ORAL_TABLET | Freq: Four times a day (QID) | ORAL | 0 refills | Status: DC | PRN

## 2019-04-23 MED ORDER — AMOXICILLIN-POT CLAVULANATE 875-125 MG PO TABS: 1.0000 | ORAL_TABLET | Freq: Two times a day (BID) | ORAL | 0 refills | Status: DC

## 2019-04-23 NOTE — Discharge Summary (Addendum)
Physician Discharge Summary  Samantha Mckenzie VXB:[id number] DOB: 01/30/1936 DOA: 04/20/2019  PCP: Colon Branch, MD  Admit date: 04/20/2019 Discharge date: 04/24/2019  Admitted From: home Disposition:  home   Recommendations for Outpatient Follow-up:  1. F/u on CBC and Bmet in 1 wk please 2. Will need to f/u with GI as outpt  Home Health:  ordered    Discharge Condition:  stable   CODE STATUS:  DNR   Diet recommendation:  Soft, diabetic, heart healthy Consultations:  GI  General surgery    Discharge Diagnoses:  Principal Problem:   Enteritis-   Ileitis, terminal Active Problems:   Diabetes mellitus type 2 with complications    Hyperlipidemia   GOUT   Normocytic anemia   Essential hypertension   GERD   Depression    Brief Summary: MARINELL Mckenzie is a 83 y.o.femalewith medical history significant forhypertension, hyperlipidemia, diabetes-diet-controlled, GERD, gout, depression, breast cancer(status post surgery, radiation and chemotherapy),CKD stage IV, diastolic CHF, presents to the ED complaining of abdominal pain, intermittent nausea/vomiting/diarrhea for the past couple of weeks. She wasrecently discharged on 04/12/2019 for similar complaints. During the last admission CT showed enteritis with possible microperforations, patient was treated with IV antibiotics and discharged home.  Hospital Course:  Enteritis- terminal ileitis  - GI consult requested- GI feels that this is residual enteritis related to her recent episode- GI has advanced her to a regular diet and is recommended to d/c her home  on Augmentin - she is not nauseated and has been drinking fluids well but refuses to eat solid food - states she has no appetite  - she has had 2 loose stools in the hospital and these have not been bloody (confirmed by the RNs)  - cont Bentyl - she will d/c to SNF  Hyponatremia - due to dehydration- has improved with IVF  Anemia - related to CKD 4- Hb has been  steadily dropping since earlier this year-  will need to follow in 1 month  CKD4 - stable  Discharge Exam: Vitals:   04/23/19 2141 04/24/19 0454  BP: (!) 139/59 (!) 146/56  Pulse: 78 77  Resp: 16 16  Temp: 98.3 F (36.8 C) 98.9 F (37.2 C)  SpO2: 98% 98%   Vitals:   04/23/19 0453 04/23/19 1317 04/23/19 2141 04/24/19 0454  BP: (!) 136/53 (!) 148/55 (!) 139/59 (!) 146/56  Pulse: 79 79 78 77  Resp: 16 15 16 16   Temp: 98.7 F (37.1 C) 98 F (36.7 C) 98.3 F (36.8 C) 98.9 F (37.2 C)  TempSrc: Oral Oral Oral Oral  SpO2: 98% 100% 98% 98%  Weight:      Height:        General: Pt is alert, awake, not in acute distress Cardiovascular: RRR, S1/S2 +, no rubs, no gallops Respiratory: CTA bilaterally, no wheezing, no rhonchi Abdominal: Soft, NT, ND, bowel sounds + Extremities: no edema, no cyanosis   Discharge Instructions  Discharge Instructions    Diet - low sodium heart healthy   Complete by: As directed    Diet general   Complete by: As directed    Low fiber, soft diet.   Increase activity slowly   Complete by: As directed    Increase activity slowly   Complete by: As directed      Allergies as of 04/24/2019      Reactions   Codeine Nausea And Vomiting   Morphine Nausea And Vomiting   Oxycodone-acetaminophen Nausea And Vomiting   Sulfonamide  Derivatives Nausea And Vomiting   Tramadol Hcl Nausea And Vomiting      Medication List    STOP taking these medications   furosemide 20 MG tablet Commonly known as: LASIX   furosemide 40 MG tablet Commonly known as: LASIX   predniSONE 20 MG tablet Commonly known as: Deltasone     TAKE these medications   acetaminophen 325 MG tablet Commonly known as: TYLENOL Take 2 tablets (650 mg total) by mouth every 6 (six) hours as needed for mild pain, fever or headache (or Fever >/= 101).   albuterol (2.5 MG/3ML) 0.083% nebulizer solution Commonly known as: PROVENTIL Take 3 mLs (2.5 mg total) by nebulization every 4  (four) hours as needed for wheezing or shortness of breath.   allopurinol 100 MG tablet Commonly known as: ZYLOPRIM Take 1 tablet (100 mg total) by mouth daily.   amLODipine 10 MG tablet Commonly known as: NORVASC Take 1 tablet (10 mg total) by mouth daily.   amoxicillin-clavulanate 875-125 MG tablet Commonly known as: Augmentin Take 1 tablet by mouth every 12 (twelve) hours for 7 days.   aspirin 81 MG tablet Take 1 tablet (81 mg total) by mouth daily with breakfast.   atorvastatin 20 MG tablet Commonly known as: LIPITOR Take 1 tablet (20 mg total) by mouth daily.   CALTRATE 600+D3 PO Take 1 tablet by mouth daily.   CENTRUM SILVER PO Take 1 tablet by mouth daily.   dicyclomine 10 MG capsule Commonly known as: BENTYL Take 2 capsules (20 mg total) by mouth 4 (four) times daily -  before meals and at bedtime.   escitalopram 5 MG tablet Commonly known as: Lexapro 1 tablet daily for 1 week, then 2 tablets daily   hydrOXYzine 10 MG tablet Commonly known as: ATARAX/VISTARIL Take 1 tablet (10 mg total) by mouth 3 (three) times daily as needed for nausea.   loperamide 2 MG tablet Commonly known as: IMODIUM A-D Take 2 mg by mouth 4 (four) times daily as needed for diarrhea or loose stools.   metoprolol tartrate 25 MG tablet Commonly known as: LOPRESSOR Take 1 tablet (25 mg total) by mouth 2 (two) times daily.   ondansetron 4 MG disintegrating tablet Commonly known as: ZOFRAN-ODT Take 1 tablet (4 mg total) by mouth every 6 (six) hours as needed for nausea or vomiting.   ondansetron 4 MG tablet Commonly known as: ZOFRAN Take 1 tablet (4 mg total) by mouth every 8 (eight) hours as needed for nausea or vomiting.   pantoprazole 40 MG tablet Commonly known as: PROTONIX Take 40 mg by mouth daily.   vitamin B-12 1000 MCG tablet Commonly known as: CYANOCOBALAMIN Take 1,000 mcg by mouth daily.   vitamin C 500 MG tablet Commonly known as: ASCORBIC ACID Take 500 mg by mouth  daily.   Vitamin D3 25 MCG (1000 UT) Caps Take 1 capsule by mouth daily.      Follow-up [place], MD Follow up in 4 day(s).   Specialty: Internal Medicine Why: Please have the following blood work done in 1 wk: Bmet and [employer] information: [place] [address] [phone]        [facility] Follow up.   Why: Agency providing home health nurse and aide, agency will call you to schedule visit.          Allergies  Allergen Reactions  . Codeine Nausea And Vomiting  . Morphine Nausea And  Vomiting  . Oxycodone-Acetaminophen Nausea And Vomiting  . Sulfonamide Derivatives Nausea And Vomiting  . Tramadol Hcl Nausea And Vomiting     Procedures/Studies:    Ct Abdomen Pelvis Wo Contrast  Result Date: 04/20/2019 CLINICAL DATA:  Acute generalized abdominal pain. EXAM: CT ABDOMEN AND PELVIS WITHOUT CONTRAST TECHNIQUE: Multidetector CT imaging of the abdomen and pelvis was performed following the standard protocol without IV contrast. COMPARISON:  CT scan of April 07, 2019. FINDINGS: Lower chest: Mild right basilar subsegmental atelectasis is noted. Hepatobiliary: No focal liver abnormality is seen. No gallstones, gallbladder wall thickening, or biliary dilatation. Pancreas: Unremarkable. No pancreatic ductal dilatation or surrounding inflammatory changes. Spleen: Calcified splenic granulomata are noted. Adrenals/Urinary Tract: Adrenal glands appear normal. Stable bilateral renal cysts are noted. No definite hydronephrosis or renal obstruction is noted. No renal or ureteral calculi are noted. Urinary bladder is unremarkable. Stomach/Bowel: The stomach appears normal. Sigmoid diverticulosis is noted without inflammation. Dilated small bowel loop is noted in the right lower quadrant with minimal surrounding inflammatory changes and some fecalization present. The more distal small bowel and ileum is unremarkable. This most likely  represents focal inflammation or enteritis. Vascular/Lymphatic: Aortic atherosclerosis. No enlarged abdominal or pelvic lymph nodes. Reproductive: Small calcified uterine fibroid is noted. No adnexal abnormality is noted. Other: No abdominal wall hernia or abnormality. No abdominopelvic ascites. Musculoskeletal: No acute or significant osseous findings. IMPRESSION: Dilated small bowel loop is seen in the right lower quadrant most consistent with proximal ileum, with minimal surrounding inflammatory changes and some fecalization. This most likely represents focal enteritis or possibly inflammatory bowel disease, although significant wall thickening is not visualized. Sigmoid diverticulosis without inflammation. Aortic Atherosclerosis (ICD10-I70.0). Electronically Signed   By: Marijo Conception M.D.   On: 04/20/2019 08:39   Ct Abdomen Pelvis Wo Contrast  Result Date: 04/07/2019 CLINICAL DATA:  Weakness, intermittent nausea and diarrhea since 03/25/2019, 4 falls, unspecified abdominal pain, history stage IV chronic renal disease, GERD, hypertension, hyperlipidemia, breast cancer EXAM: CT ABDOMEN AND PELVIS WITHOUT CONTRAST TECHNIQUE: Multidetector CT imaging of the abdomen and pelvis was performed following the standard protocol without IV contrast. Sagittal and coronal MPR images reconstructed from axial data set. No oral contrast was administered COMPARISON:  03/19/2019 FINDINGS: Lower chest: Bibasilar atelectasis with questionable coexistent consolidation RIGHT lower lobe. Hepatobiliary: Gallbladder and liver normal appearance Pancreas: Atrophic pancreas without mass Spleen: Normal size with a few scattered calcified granulomata. Adrenals/Urinary Tract: Adrenal glands normal appearance. Minimally dilated renal pelves bilaterally without hydroureter or ureteral calcification. 2.2 x 2.3 cm exophytic RIGHT renal cyst. Intermediate attenuation 11 mm nodule posterior LEFT kidney image 33 versus lobulated cortex  unchanged. Ureters and bladder otherwise unremarkable. Stomach/Bowel: Sigmoid diverticulosis without evidence of diverticulitis. Appendix not definitely visualized. Loop of small bowel in the RIGHT upper lateral pelvis demonstrates mild wall thickening and surrounding inflammatory changes compatible with enteritis. Several tiny foci of gas are seen either within or immediately adjacent to the small bowel wall, cannot exclude microperforation. Small bowel proximal to this is upper normal in caliber and demonstrates a small bowel stool sign suggesting prolonged transit. No definite dilatation/obstruction. Vascular/Lymphatic: Atherosclerotic calcifications aorta and iliac arteries without aneurysm. No adenopathy. Reproductive: Atrophic uterus and ovaries Other: No free air or free fluid.  No hernia. Musculoskeletal: Osseous demineralization. IMPRESSION: Focal inflammatory process of the small bowel loop in the upper RIGHT lateral pelvis with bowel wall thickening and adjacent infiltrative changes compatible with enteritis. This could be due to infection, focal inflammatory bowel disease, foreign  body perforation. Tiny foci of gas within or immediately adjacent to the small bowel wall at this site, cannot exclude micro perforation. No definite bowel dilatation to suggest obstruction, though small bowel stool sign is seen proximal to this indicating prolonged transit. No free air or abscess identified. Findings called to Dr. Tamera Punt on 04/07/2019 at 1722 hrs. Electronically Signed   By: Lavonia Dana M.D.   On: 04/07/2019 17:23   Dg Chest 2 View  Result Date: 04/20/2019 CLINICAL DATA:  Fever and weakness. EXAM: CHEST - 2 VIEW COMPARISON:  Two-view chest x-ray 04/16/2019 FINDINGS: Heart size is exaggerated by low lung volumes. Is chronic elevation of the right hemidiaphragm. Mild bibasilar opacities are present. Postsurgical changes are noted in the left axilla. The visualized soft tissues and bony thorax are otherwise  unremarkable. Atherosclerotic changes are noted in the aorta. IMPRESSION: 1. Lung volumes with mild bibasilar airspace disease, likely atelectasis. 2. Borderline cardiomegaly without failure. 3. Aortic atherosclerosis. Electronically Signed   By: San Morelle M.D.   On: 04/20/2019 06:01   Dg Chest 2 View  Result Date: 04/16/2019 CLINICAL DATA:  Pt states she was dx with pneumonia last week, denies any worse symptoms but still has cough, HTN, hyperlipidemia, GERD, hx breast ca., left mastectomy EXAM: CHEST - 2 VIEW COMPARISON:  04/07/2019 and older exams. FINDINGS: The cardiac silhouette is normal in size and configuration. No mediastinal hilar masses. There is no evidence of adenopathy. Clear lungs.  No pleural effusion or pneumothorax. Postsurgical changes on the left stable. No acute skeletal abnormality. IMPRESSION: No active cardiopulmonary disease. Electronically Signed   By: Lajean Manes M.D.   On: 04/16/2019 17:20   Ct Head Wo Contrast  Result Date: 04/07/2019 CLINICAL DATA:  Weakness, multiple falls EXAM: CT HEAD WITHOUT CONTRAST TECHNIQUE: Contiguous axial images were obtained from the base of the skull through the vertex without intravenous contrast. COMPARISON:  04/04/2019 FINDINGS: Brain: No evidence of acute infarction, hemorrhage, hydrocephalus, extra-axial collection or mass lesion/mass effect. Mild periventricular white matter hypodensity. Vascular: No hyperdense vessel or unexpected calcification. Skull: Normal. Negative for fracture or focal lesion. Sinuses/Orbits: No acute finding. Other: None. IMPRESSION: No acute intracranial pathology. Electronically Signed   By: Eddie Candle M.D.   On: 04/07/2019 17:07   Ct Head Wo Contrast  Result Date: 04/04/2019 CLINICAL DATA:  [place] this morning, hitting back of head on a dresser. EXAM: CT HEAD WITHOUT CONTRAST TECHNIQUE: Contiguous axial images were obtained from the base of the skull through the vertex without intravenous contrast.  COMPARISON:  Brain MRI 06/11/2017 FINDINGS: Brain: There is no evidence of acute infarct, intracranial hemorrhage, mass, midline shift, or extra-axial fluid collection. Mild cerebral atrophy is unchanged. Vascular: Calcified atherosclerosis at the skull base. No hyperdense vessel. Skull: No fracture or focal osseous lesion. Sinuses/Orbits: Visualized paranasal sinuses and mastoid air cells are clear. Visualized orbits are unremarkable. Other: Mild posterior scalp swelling and gas with skin staples in place. IMPRESSION: 1. No evidence of acute intracranial abnormality. 2. Mild posterior scalp swelling. Electronically Signed   By: Logan Bores M.D.   On: 04/04/2019 16:54   Dg Chest Port 1 View  Result Date: 04/07/2019 CLINICAL DATA:  83 year old female with nausea, diarrhea, weakness and frequent falls EXAM: PORTABLE CHEST 1 VIEW COMPARISON:  Prior chest x-ray 03/19/2019 FINDINGS: Stable cardiomegaly. Surgical changes of prior left mastectomy and axillary nodal dissection. Subtle patchy right basilar airspace opacity favored to reflect atelectasis in the setting of low inspiratory volumes. No pulmonary edema. No  large effusion or pneumothorax. No acute osseous abnormality. IMPRESSION: Low inspiratory volumes and stable cardiomegaly. No acute cardiopulmonary process. Electronically Signed   By: Jacqulynn Cadet M.D.   On: 04/07/2019 17:06     The results of significant diagnostics from this hospitalization (including imaging, microbiology, ancillary and laboratory) are listed below for reference.     Microbiology: Recent Results (from the past 240 hour(s))  SARS Coronavirus 2 (CEPHEID - Performed in [facility] hospital lab), Hosp Order     Status: None   Collection Time: 04/20/19  8:13 AM   Specimen: Nasopharyngeal Swab  Result Value Ref Range Status   SARS Coronavirus 2 NEGATIVE NEGATIVE Final    Comment: (NOTE) If result is NEGATIVE SARS-CoV-2 target nucleic acids are NOT DETECTED. The  SARS-CoV-2 RNA is generally detectable in upper and lower  respiratory specimens during the acute phase of infection. The lowest  concentration of SARS-CoV-2 viral copies this assay can detect is 250  copies / mL. A negative result does not preclude SARS-CoV-2 infection  and should not be used as the sole basis for treatment or other  patient management decisions.  A negative result may occur with  improper specimen collection / handling, submission of specimen other  than nasopharyngeal swab, presence of viral mutation(s) within the  areas targeted by this assay, and inadequate number of viral copies  (<250 copies / mL). A negative result must be combined with clinical  observations, patient history, and epidemiological information. If result is POSITIVE SARS-CoV-2 target nucleic acids are DETECTED. The SARS-CoV-2 RNA is generally detectable in upper and lower  respiratory specimens dur ing the acute phase of infection.  Positive  results are indicative of active infection with SARS-CoV-2.  Clinical  correlation with patient history and other diagnostic information is  necessary to determine patient infection status.  Positive results do  not rule out bacterial infection or co-infection with other viruses. If result is PRESUMPTIVE POSTIVE SARS-CoV-2 nucleic acids MAY BE PRESENT.   A presumptive positive result was obtained on the submitted specimen  and confirmed on repeat testing.  While 2019 novel coronavirus  (SARS-CoV-2) nucleic acids may be present in the submitted sample  additional confirmatory testing may be necessary for epidemiological  and / or clinical management purposes  to differentiate between  SARS-CoV-2 and other Sarbecovirus currently known to infect humans.  If clinically indicated additional testing with an alternate test  methodology [phone]) is advised. The SARS-CoV-2 RNA is generally  detectable in upper and lower respiratory sp ecimens during the acute   phase of infection. The expected result is Negative. Fact Sheet for Patients:  StrictlyIdeas.no Fact Sheet for Healthcare Providers: BankingDealers.co.za This test is not yet approved or cleared by the [place] FDA and has been authorized for detection and/or diagnosis of SARS-CoV-2 by FDA under an Emergency Use Authorization (EUA).  This EUA will remain in effect (meaning this test can be used) for the duration of the COVID-19 declaration under Section 564(b)(1) of the Act, 21 U.S.C. section 360bbb-3(b)(1), unless the authorization is terminated or revoked sooner. Performed at [facility], [place] [address] [place], [place] [address]   Culture, blood (routine x 2)     Status: None (Preliminary result)   Collection Time: 04/20/19  2:02 PM   Specimen: BLOOD LEFT HAND  Result Value Ref Range Status   Specimen Description   Final    BLOOD LEFT HAND Performed at [place] [address] [place], [place]  [address]    Special Requests   Final    BOTTLES DRAWN AEROBIC ONLY Blood Culture adequate volume Performed at [facility] [address] [place], [place] [address]    Culture   Final    NO GROWTH 3 DAYS Performed at [facility], [place] [address] [place], [place] [address]    Report Status PENDING  Incomplete  Culture, blood (routine x 2)     Status: None (Preliminary result)   Collection Time: 04/20/19  2:02 PM   Specimen: BLOOD  Result Value Ref Range Status   Specimen Description   Final    BLOOD LEFT ANTECUBITAL Performed at [facility] [address], [place], [place] [address]    Special Requests   Final    BOTTLES DRAWN AEROBIC ONLY Blood Culture adequate volume Performed at [facility] [address] [place], [place] [address]    Culture   Final    NO GROWTH 3 DAYS Performed at [facility],  [place] [address], [place], [place] [address]    Report Status PENDING  Incomplete     Labs: BNP (last 3 results) Recent Labs    04/07/19 2157  BNP 440.1*   Basic Metabolic Panel: Recent Labs  Lab 04/20/19 0629 04/21/19 0509 04/22/19 0427 04/23/19 0355  NA 129* 137  --  144  K 3.7 3.4*  --  3.9  CL 87* 98  --  110  CO2 27 27  --  23  GLUCOSE 149* 93  --  122*  BUN 50* 48*  --  26*  CREATININE 3.07* 2.52* 2.42* 2.25*  CALCIUM 8.5* 7.9*  --  8.0*   Liver Function Tests: Recent Labs  Lab 04/20/19 0629 04/21/19 0509  AST 16 15  ALT 14 13  ALKPHOS 56 53  BILITOT 0.6 0.5  PROT 6.9 5.6*  ALBUMIN 2.6* 2.0*   Recent Labs  Lab 04/20/19 0629  LIPASE 31   No results for input(s): AMMONIA in the last 168 hours. CBC: Recent Labs  Lab 04/20/19 0629 04/21/19 0509  WBC 7.7 5.8  NEUTROABS 6.0 4.2  HGB 9.2* 8.2*  HCT 28.2* 25.8*  MCV 91.0 91.2  PLT 404* 327   Cardiac Enzymes: No results for input(s): CKTOTAL, CKMB, CKMBINDEX, TROPONINI in the last 168 hours. BNP: Invalid input(s): POCBNP CBG: Recent Labs  Lab 04/23/19 1615 04/23/19 1929 04/23/19 2332 04/24/19 0333 04/24/19 0739  GLUCAP 116* 116* 94 86 93   D-Dimer No results for input(s): DDIMER in the last 72 hours. Hgb A1c No results for input(s): HGBA1C in the last 72 hours. Lipid Profile No results for input(s): CHOL, HDL, LDLCALC, TRIG, CHOLHDL, LDLDIRECT in the last 72 hours. Thyroid function studies No results for input(s): TSH, T4TOTAL, T3FREE, THYROIDAB in the last 72 hours.  Invalid input(s): FREET3 Anemia work up No results for input(s): VITAMINB12, FOLATE, FERRITIN, TIBC, IRON, RETICCTPCT in the last 72 hours. Urinalysis    Component Value Date/Time   COLORURINE YELLOW 04/20/2019 0539   APPEARANCEUR CLEAR 04/20/2019 0539   LABSPEC 1.005 04/20/2019 0539   PHURINE 6.0 04/20/2019 0539   GLUCOSEU NEGATIVE 04/20/2019 0539   HGBUR SMALL (A) 04/20/2019 0539   BILIRUBINUR NEGATIVE 04/20/2019 0539    KETONESUR NEGATIVE 04/20/2019 0539   PROTEINUR NEGATIVE 04/20/2019 0539   UROBILINOGEN 0.2 12/29/2014 1400   NITRITE NEGATIVE 04/20/2019 0539   LEUKOCYTESUR NEGATIVE 04/20/2019 0539   Sepsis Labs Invalid input(s): PROCALCITONIN,  WBC,  LACTICIDVEN  Microbiology Recent Results (from the past 240 hour(s))  SARS Coronavirus 2 (CEPHEID - Performed in [facility] [facility] hospital lab), Hosp Order     Status: None   Collection Time: 04/20/19  8:13 AM   Specimen: Nasopharyngeal Swab  Result Value Ref Range Status   SARS Coronavirus 2 NEGATIVE NEGATIVE Final    Comment: (NOTE) If result is NEGATIVE SARS-CoV-2 target nucleic acids are NOT DETECTED. The SARS-CoV-2 RNA is generally detectable in upper and lower  respiratory specimens during the acute phase of infection. The lowest  concentration of SARS-CoV-2 viral copies this assay can detect is 250  copies / mL. A negative result does not preclude SARS-CoV-2 infection  and should not be used as the sole basis for treatment or other  patient management decisions.  A negative result may occur with  improper specimen collection / handling, submission of specimen other  than nasopharyngeal swab, presence of viral mutation(s) within the  areas targeted by this assay, and inadequate number of viral copies  (<250 copies / mL). A negative result must be combined with clinical  observations, patient history, and epidemiological information. If result is POSITIVE SARS-CoV-2 target nucleic acids are DETECTED. The SARS-CoV-2 RNA is generally detectable in upper and lower  respiratory specimens dur ing the acute phase of infection.  Positive  results are indicative of active infection with SARS-CoV-2.  Clinical  correlation with patient history and other diagnostic information is  necessary to determine patient infection status.  Positive results do  not rule out bacterial infection or co-infection with other viruses. If result is PRESUMPTIVE  POSTIVE SARS-CoV-2 nucleic acids MAY BE PRESENT.   A presumptive positive result was obtained on the submitted specimen  and confirmed on repeat testing.  While 2019 novel coronavirus  (SARS-CoV-2) nucleic acids may be present in the submitted sample  additional confirmatory testing may be necessary for epidemiological  and / or clinical management purposes  to differentiate between  SARS-CoV-2 and other Sarbecovirus currently known to infect humans.  If clinically indicated additional testing with an alternate test  methodology [phone]) is advised. The SARS-CoV-2 RNA is generally  detectable in upper and lower respiratory sp ecimens during the acute  phase of infection. The expected result is Negative. Fact Sheet for Patients:  StrictlyIdeas.no Fact Sheet for Healthcare Providers: BankingDealers.co.za This test is not yet approved or cleared by the [place] FDA and has been authorized for detection and/or diagnosis of SARS-CoV-2 by FDA under an Emergency Use Authorization (EUA).  This EUA will remain in effect (meaning this test can be used) for the duration of the COVID-19 declaration under Section 564(b)(1) of the Act, 21 U.S.C. section 360bbb-3(b)(1), unless the authorization is terminated or revoked sooner. Performed at [facility], [place] [address] [place], [place] [address]   Culture, blood (routine x 2)     Status: None (Preliminary result)   Collection Time: 04/20/19  2:02 PM   Specimen: BLOOD LEFT HAND  Result Value Ref Range Status   Specimen Description   Final    BLOOD LEFT HAND Performed at [facility] [address] [place], [place] [address]    Special Requests   Final    BOTTLES DRAWN AEROBIC ONLY Blood Culture adequate volume Performed at [facility] [address], [place], [place] [address]    Culture   Final    NO GROWTH 3 DAYS Performed at  [facility], [place] [address] [place], [place] [address]  Report Status PENDING  Incomplete  Culture, blood (routine x 2)     Status: None (Preliminary result)   Collection Time: 04/20/19  2:02 PM   Specimen: BLOOD  Result Value Ref Range Status   Specimen Description   Final    BLOOD LEFT ANTECUBITAL Performed at [facility] [address], [place], [place] [address]    Special Requests   Final    BOTTLES DRAWN AEROBIC ONLY Blood Culture adequate volume Performed at [facility] [address] [place], [place] [address]    Culture   Final    NO GROWTH 3 DAYS Performed at [facility], [place] [address], [place], [place] [address]    Report Status PENDING  Incomplete     Time coordinating discharge in minutes: 65  SIGNED:   Debbe Odea, MD  [facility] 04/24/2019, 11:44 AM Pager   If 7PM-7AM, please contact night-coverage [email or web address] Password [email or web address]

## 2019-04-23 NOTE — Progress Notes (Signed)
Patient crying in bed, she refuses to sit up in chair. She states she wants me and Dr. Wynelle Cleveland to help her decide what she should do, go to a SNF or go home tonight. Explained she needed to make that decision with her family. She keeps changing her mind about where she wants to go. She states her son doesn't want her to go home. Patient states she is getting very depressed. She feels no body is doing anything to help her. Case manager is working on finding a bed for her at a SNF. Dr. Wynelle Cleveland texted how patient is feeling.

## 2019-04-23 NOTE — Telephone Encounter (Signed)
Copied from [place] [id number]. Topic: Quick Communication - Home Health Verbal Orders >> Apr 23, 2019  9:25 AM Nils Flack wrote: Caller/Agency: [facility] [employer] Number: [phone] Requesting OT/PT/Skilled Nursing/Social Work/Speech Therapy: pt  Frequency: 1 week1  1every other week 2 1 week 4 Leave message if no answer

## 2019-04-23 NOTE — Consult Note (Addendum)
   [facility] Inpatient Consult   04/23/2019  CHRISSI CROW April 04, 1936 [id number]   Patient chart has been reviewed for readmissions less than 30 days and for high risk score, 38%, for unplanned readmissions.  Patient assessed for community [facility] follow up needs as member under ACO plan with Medicare insurance.  Chart review reveals current disposition to be determined, between SNF and home health. Will continue to follow for progression and disposition plans.  [facility] Follow up:  Current disposition plan is for SNF. No [facility] CM needs.  Netta Cedars, MSN, [facility] Liaison Nurse Mobile Phone [phone]  Toll free office [phone]

## 2019-04-23 NOTE — Telephone Encounter (Signed)
Spoke w/ Tharon Aquas- verbal orders given. Informed that Pt is currently admitted back into hosp.

## 2019-04-23 NOTE — TOC Progression Note (Signed)
Transition of Care [facility]) - Progression Note    Patient Details  Name: Samantha Mckenzie MRN: [id number] Date of Birth: 15-May-1936  Transition of Care [facility]) CM/SW Contact  Servando Snare, [place] Phone Number: 04/23/2019, 12:55 PM  Clinical Narrative:   LCSW received call from floor RN at dc that patient now wants SNF. Original plan was home with home health. LCSW will work patient up for SNF.     Expected Discharge Plan: [place] Barriers to Discharge: No Barriers Identified  Expected Discharge Plan and Services Expected Discharge Plan: [place]   Discharge Planning Services: CM Consult   Living arrangements for the past 2 months: Single Family Home Expected Discharge Date: 04/23/19               DME Arranged: N/A DME Agency: NA       HH Arranged: NA HH Agency: NA         Social Determinants of Health (SDOH) Interventions    Readmission Risk Interventions Readmission Risk Prevention Plan 04/10/2019 03/26/2019  Transportation Screening Complete Complete  PCP or Specialist Appt within 3-5 Days - Complete  HRI or [place] - Complete  Social Work Consult for [place] Planning/Counseling - Complete  Palliative Care Screening - Not Applicable  Medication Review Press photographer) Referral to Pharmacy Complete  PCP or Specialist appointment within 3-5 days of discharge Complete -  [place] or Home Care Consult Complete -  SW Recovery Care/Counseling Consult Complete -  Palliative Care Screening Not Applicable -  [place] Not Applicable -  Some recent data might be hidden

## 2019-04-23 NOTE — NC FL2 (Signed)
[place] MEDICAID FL2 LEVEL OF CARE SCREENING TOOL     IDENTIFICATION  Patient Name: Samantha Mckenzie Birthdate: Feb 01, 1936 Sex: female Admission Date (Current Location): 04/20/2019  [facility] and [place] Number:  Herbalist and Address:  [facility],  [place] [address] [place]      Provider Number: [id number]  Attending Physician Name and Address:  Debbe Odea, MD  Relative Name and Phone Number:       Current Level of Care: Hospital Recommended Level of Care: [place] Prior Approval Number:    Date Approved/Denied:   PASRR Number: [id number]  Discharge Plan: SNF    Current Diagnoses: Patient Active Problem List   Diagnosis Date Noted  . Ileitis, terminal ([place]) 04/22/2019  . COPD (chronic obstructive pulmonary disease) ([place]) 04/12/2019  . Enteritis 04/07/2019  . Chronic diastolic CHF (congestive heart failure) ([place]) 04/07/2019  . Hypokalemia 04/07/2019  . Hypocalcemia 04/07/2019  . Fall 04/07/2019  . Occult blood positive stool 04/07/2019  . Adjustment disorder with depressed mood   . Generalized abdominal pain   . Dysphasia 03/21/2019  . CAP (community acquired pneumonia) 03/20/2019  . Nausea vomiting and diarrhea 03/20/2019  . Acute respiratory failure with hypoxia ([place]) 03/20/2019  . Acute renal failure superimposed on stage 4 chronic kidney disease ([place]) 03/20/2019  . Lactic acidosis 03/20/2019  . Diabetic peripheral neuropathy associated with type 2 diabetes mellitus ([place]) 03/20/2019  . Hypomagnesemia 06/21/2018  . Generalized weakness 06/20/2018  . PCP NOTES >>> 07/11/2015  . Depression   . VITAMIN B12 DEFICIENCY 04/01/2008  . GERD 12/19/2007  . NEOP, MALIGNANT, FEMALE BREAST NOS 07/07/2007  . Osteoarthritis 07/07/2007  . Diabetes mellitus type 2 with complications ([place]) 26/41/5830  . Hyperlipidemia 02/28/2007  . GOUT 02/28/2007  . Normocytic anemia 02/28/2007  . Essential hypertension 02/28/2007     Orientation RESPIRATION BLADDER Height & Weight     Self, Time, Situation, Place  Normal Continent Weight: 132 lb (59.9 kg) Height:  5' 4"  (162.6 cm)  BEHAVIORAL SYMPTOMS/MOOD NEUROLOGICAL BOWEL NUTRITION STATUS      Continent Diet(see dc summary)  AMBULATORY STATUS COMMUNICATION OF NEEDS Skin   Extensive Assist Verbally Normal                       Personal Care Assistance Level of Assistance  Bathing, Feeding, Dressing Bathing Assistance: Limited assistance Feeding assistance: Independent Dressing Assistance: Limited assistance     Functional Limitations Info  Sight, Hearing, Speech Sight Info: Impaired(glasses) Hearing Info: Adequate Speech Info: Adequate    SPECIAL CARE FACTORS FREQUENCY  PT (By licensed PT), OT (By licensed OT)     PT Frequency: 5x/week OT Frequency: 5x/week            Contractures Contractures Info: Not present    Additional Factors Info  Code Status, Allergies Code Status Info: DNR Allergies Info: Codeine, Morphine, Oxycodone-acetaminophen, Sulfonamide Derivatives, Tramadol Hcl           Current Medications (04/23/2019):  This is the current hospital active medication list Current Facility-Administered Medications  Medication Dose Route Frequency Provider Last Rate Last Dose  . 0.9 %  sodium chloride infusion   Intravenous Continuous Alma Friendly, MD 100 mL/hr at 04/23/19 0040    . acetaminophen (TYLENOL) tablet 650 mg  650 mg Oral Q6H PRN Alma Friendly, MD       Or  . acetaminophen (TYLENOL) suppository 650 mg  650 mg Rectal Q6H PRN Ezenduka,  Adline Peals, MD      . acetaminophen (TYLENOL) tablet 650 mg  650 mg Oral Once Lacretia Leigh, MD      . albuterol (PROVENTIL) (2.5 MG/3ML) 0.083% nebulizer solution 2.5 mg  2.5 mg Nebulization Q4H PRN Alma Friendly, MD      . allopurinol (ZYLOPRIM) tablet 100 mg  100 mg Oral Daily Alma Friendly, MD   100 mg at 04/23/19 1131  . dicyclomine (BENTYL) capsule 20  mg  20 mg Oral TID AC & HS Alma Friendly, MD   20 mg at 04/23/19 1130  . escitalopram (LEXAPRO) tablet 5 mg  5 mg Oral QHS Alma Friendly, MD   5 mg at 04/22/19 2205  . heparin injection 5,000 Units  5,000 Units Subcutaneous Q8H Alma Friendly, MD   5,000 Units at 04/23/19 0535  . hydrOXYzine (ATARAX/VISTARIL) tablet 10 mg  10 mg Oral TID PRN Alma Friendly, MD      . insulin aspart (novoLOG) injection 0-9 Units  0-9 Units Subcutaneous TID WC Rizwan, Saima, MD      . ondansetron (ZOFRAN) tablet 4 mg  4 mg Oral Q6H PRN Alma Friendly, MD   4 mg at 04/22/19 0029   Or  . ondansetron (ZOFRAN) injection 4 mg  4 mg Intravenous Q6H PRN Alma Friendly, MD   4 mg at 04/22/19 1610  . ondansetron (ZOFRAN) injection 4 mg  4 mg Intravenous Q6H Debbe Odea, MD   4 mg at 04/23/19 1132  . pantoprazole (PROTONIX) EC tablet 40 mg  40 mg Oral Daily Alma Friendly, MD   40 mg at 04/23/19 1131  . piperacillin-tazobactam (ZOSYN) IVPB 2.25 g  2.25 g Intravenous Q8H Pham, Anh P, RPH 100 mL/hr at 04/23/19 0536 2.25 g at 04/23/19 0536     Discharge Medications: Please see discharge summary for a list of discharge medications.  Relevant Imaging Results:  Relevant Lab Results:   Additional Information ssn: [id number]  Servando Snare, LCSW

## 2019-04-24 LAB — GLUCOSE, CAPILLARY
Glucose-Capillary: 86 mg/dL (ref 70–99)
Glucose-Capillary: 93 mg/dL (ref 70–99)
Glucose-Capillary: 119 mg/dL — ABNORMAL HIGH (ref 70–99)

## 2019-04-24 NOTE — TOC Transition Note (Signed)
Transition of Care [facility]) - CM/SW Discharge Note   Patient Details  Name: Samantha Mckenzie MRN: [id number] Date of Birth: 1936/05/02  [facility] CM/SW Contact:  Servando Snare, LCSW Phone Number: 04/24/2019, 10:34 AM   Clinical Narrative:   Patient has bed at [facility]. Patient can transport around noon.   RN report # [phone]    Final next level of care: Skilled Nursing Facility Barriers to Discharge: No Barriers Identified   Patient Goals and CMS Choice Patient states their goals for this hospitalization and ongoing recovery are:: Get up and walk CMS Medicare.gov Compare Post Acute Care list provided to:: Patient Choice offered to / list presented to : Patient  Discharge Placement              Patient chooses bed at: [facility] Patient to be transferred to facility by: EMS Name of family member notified: [name] Patient and family notified of of transfer: 04/24/19  Discharge Plan and Services   Discharge Planning Services: CM Consult            DME Arranged: N/A DME Agency: NA       HH Arranged: NA HH Agency: NA        Social Determinants of Health ([place]) Interventions     Readmission Risk Interventions Readmission Risk Prevention Plan 04/10/2019 03/26/2019  Transportation Screening Complete Complete  PCP or Specialist Appt within 3-5 Days - Complete  HRI or [place] - Complete  Social Work Consult for [place] Planning/Counseling - Complete  Palliative Care Screening - Not Applicable  Medication Review Press photographer) Referral to Pharmacy Complete  PCP or Specialist appointment within 3-5 days of discharge Complete -  [place] or Home Care Consult Complete -  SW Recovery Care/Counseling Consult Complete -  Palliative Care Screening Not Applicable -  [place] Not Applicable -  Some recent data might be hidden

## 2019-04-24 NOTE — Progress Notes (Signed)
Discharge instructions given to pt and all questions were answered.  

## 2019-04-24 NOTE — Progress Notes (Signed)
Report given to Solmon Ice RN at [facility].

## 2019-04-24 NOTE — Progress Notes (Signed)
[facility]  I have evaluated the patient today. She was awaiting a SNF bed which she has received today. She is stable for discharge. Please see my d/c summary from 7/13.    Debbe Odea, MD

## 2019-04-25 ENCOUNTER — Ambulatory Visit (INDEPENDENT_AMBULATORY_CARE_PROVIDER_SITE_OTHER): Payer: Medicare Other | Admitting: Internal Medicine

## 2019-04-25 ENCOUNTER — Telehealth: Payer: Self-pay | Admitting: Internal Medicine

## 2019-04-25 ENCOUNTER — Telehealth: Payer: Self-pay | Admitting: *Deleted

## 2019-04-25 ENCOUNTER — Other Ambulatory Visit: Payer: Self-pay

## 2019-04-25 DIAGNOSIS — R627 Adult failure to thrive: Secondary | ICD-10-CM | POA: Diagnosis not present

## 2019-04-25 DIAGNOSIS — K529 Noninfective gastroenteritis and colitis, unspecified: Secondary | ICD-10-CM | POA: Diagnosis not present

## 2019-04-25 DIAGNOSIS — F339 Major depressive disorder, recurrent, unspecified: Secondary | ICD-10-CM

## 2019-04-25 LAB — CULTURE, BLOOD (ROUTINE X 2)
Culture: NO GROWTH
Culture: NO GROWTH
Special Requests: ADEQUATE
Special Requests: ADEQUATE

## 2019-04-25 NOTE — Telephone Encounter (Signed)
Called back. Husband answered and stated that pt is okay but still sleeping. Discussed with husband importance and safety of pt returning to SNF. Husband states he agrees and will try again to convince wife. Husband states either way, he will have her at PCP appt in the morning. Husband instructed to call 911 or take wife to ER in case of emergency.

## 2019-04-25 NOTE — Progress Notes (Signed)
Subjective:    Patient ID: Samantha Mckenzie, female    DOB: 1936-07-01, 83 y.o.   MRN: [id number]  DOS:  04/25/2019 Type of visit - description: Attempted  to make this a video visit, due to technical difficulties from the patient side it was not possible  thus we proceeded with a Virtual Visit via Telephone    I connected with@ on 04/26/19 at  2:00 PM EDT by telephone and verified that I am speaking with the correct person using two identifiers.  THIS ENCOUNTER IS A VIRTUAL VISIT DUE TO COVID-19 - PATIENT WAS NOT SEEN IN THE OFFICE. PATIENT HAS CONSENTED TO VIRTUAL VISIT / TELEMEDICINE VISIT   Location of patient: home  Location of provider: office  I discussed the limitations, risks, security and privacy concerns of performing an evaluation and management service by telephone and the availability of in person appointments. I also discussed with the patient that there may be a patient responsible charge related to this service. The patient expressed understanding and agreed to proceed.  History of Present Illness: Acute 4 days after the last visit, she presented to the ER on 04/20/2019, discharged 04/24/2019. She had abdominal pain, nausea, vomiting, diarrhea. Prior admission for similar symptom showed enteritis on CT.  During this last admission, GI consulted, they felt she had residual enteritis, Was recommended to advance diet and discharge home on Augmentin. She was tolerating fluids well but refused solid foods.  She reports lack of appetite. Stools were loose but nonbloody. She was recommended SNF, apparently she went briefly there yesterday, but then left [place].    Most recent creatinine 2.2, not far from baseline Last hemoglobin 8.2. CT abdomen  during this admission  Dilated small bowel loop is seen in the right lower quadrant most consistent with proximal ileum, with minimal surrounding inflammatory changes and some fecalization. This most likely  represents focal enteritis or possibly inflammatory bowel disease, although significant wall thickening is not visualized. Sigmoid diverticulosis without inflammation.    Review of Systems Today, I spoke with the patient and her husband. They report that indeed she went to the SNF yesterday, stay for 2 to 3 hours, pt states "nobody talk to me" so she decided to leave. She is now home, essentially in bed, very weak, not eating solids, drinking some fluids. I ask about what medication she is taking, and she said that she is not sure because she does not have her discharge summary with her with a list of medications. States that she did not get Augmentin as recommended by the hospital. She does have some Lexapro at home but is not taking it regularly.  Denies fever chills Denies nausea or vomiting Reports that she still has loose stools, red in color. Abdominal pain is gone She again feels depressed and she states that she cannot continue living. "It is time for me to go" "I am ready" "I pray to the Lord to take me". Despite her statements, she reports no suicidal ideas or intentions.  Past Medical History:  Diagnosis Date  . Allergic rhinitis   . Anemia   . Breast CA ([place])    surgery, chemo, XRT; had peripheral neuropathy (imbalance at times) after chemo  . Cellulitis    Left arm, recurrent   . Cellulitis LEFT arm recurrent 12/08/2011  . Chronic renal disease, stage IV (HCC)    Dr. Moshe Cipro  . Depression   . Fatigue   . GERD (gastroesophageal reflux disease)  gastritis, EGD 02/2007  . Gout   . Hyperlipidemia   . Hypertension   . Macular degeneration 2011  . Normal cardiac stress test 12/2014  . Osteoarthritis   . Renal insufficiency     Past Surgical History:  Procedure Laterality Date  . cataracts bilaterally  1 -2012  . LUNG BIOPSY  1999   neg  . MASTECTOMY Left 1999   and lymphnodes     Social History   Socioeconomic History  . Marital status: Married     Spouse name: Not on file  . Number of children: 2  . Years of education: Not on file  . Highest education level: Not on file  Occupational History  . Occupation: retired     Fish farm manager: RETIRED  Social Needs  . Financial resource strain: Not on file  . Food insecurity    Worry: Not on file    Inability: Not on file  . Transportation needs    Medical: Not on file    Non-medical: Not on file  Tobacco Use  . Smoking status: Never Smoker  . Smokeless tobacco: Never Used  Substance and Sexual Activity  . Alcohol use: Yes    Alcohol/week: 0.0 standard drinks    Comment: rarely  . Drug use: No  . Sexual activity: Not Currently  Lifestyle  . Physical activity    Days per week: Not on file    Minutes per session: Not on file  . Stress: Not on file  Relationships  . Social Herbalist on phone: Not on file    Gets together: Not on file    Attends religious service: Not on file    Active member of club or organization: Not on file    Attends meetings of clubs or organizations: Not on file    Relationship status: Not on file  . Intimate partner violence    Fear of current or ex partner: Not on file    Emotionally abused: Not on file    Physically abused: Not on file    Forced sexual activity: Not on file  Other Topics Concern  . Not on file  Social History Narrative   Lives w/ husband , 2 children in [place], 2 [place] as of 04/25/2019      Reactions   Codeine Nausea And Vomiting   Morphine Nausea And Vomiting   Oxycodone-acetaminophen Nausea And Vomiting   Sulfonamide Derivatives Nausea And Vomiting   Tramadol Hcl Nausea And Vomiting      Medication List       Accurate as of April 25, 2019  4:50 PM. If you have any questions, ask your nurse or doctor.        acetaminophen 325 MG tablet Commonly known as: TYLENOL Take 2 tablets (650 mg total) by mouth every 6 (six) hours as needed for mild pain, fever or headache (or Fever >/= 101).    albuterol (2.5 MG/3ML) 0.083% nebulizer solution Commonly known as: PROVENTIL Take 3 mLs (2.5 mg total) by nebulization every 4 (four) hours as needed for wheezing or shortness of breath.   allopurinol 100 MG tablet Commonly known as: ZYLOPRIM Take 1 tablet (100 mg total) by mouth daily.   amLODipine 10 MG tablet Commonly known as: NORVASC Take 1 tablet (10 mg total) by mouth daily.   amoxicillin-clavulanate 875-125 MG tablet Commonly known as: Augmentin Take 1 tablet by mouth every 12 (  twelve) hours for 7 days.   aspirin 81 MG tablet Take 1 tablet (81 mg total) by mouth daily with breakfast.   atorvastatin 20 MG tablet Commonly known as: LIPITOR Take 1 tablet (20 mg total) by mouth daily.   CALTRATE 600+D3 PO Take 1 tablet by mouth daily.   CENTRUM SILVER PO Take 1 tablet by mouth daily.   dicyclomine 10 MG capsule Commonly known as: BENTYL Take 2 capsules (20 mg total) by mouth 4 (four) times daily -  before meals and at bedtime.   escitalopram 5 MG tablet Commonly known as: Lexapro 1 tablet daily for 1 week, then 2 tablets daily   hydrOXYzine 10 MG tablet Commonly known as: ATARAX/VISTARIL Take 1 tablet (10 mg total) by mouth 3 (three) times daily as needed for nausea.   loperamide 2 MG tablet Commonly known as: IMODIUM A-D Take 2 mg by mouth 4 (four) times daily as needed for diarrhea or loose stools.   metoprolol tartrate 25 MG tablet Commonly known as: LOPRESSOR Take 1 tablet (25 mg total) by mouth 2 (two) times daily.   ondansetron 4 MG disintegrating tablet Commonly known as: ZOFRAN-ODT Take 1 tablet (4 mg total) by mouth every 6 (six) hours as needed for nausea or vomiting.   ondansetron 4 MG tablet Commonly known as: ZOFRAN Take 1 tablet (4 mg total) by mouth every 8 (eight) hours as needed for nausea or vomiting.   pantoprazole 40 MG tablet Commonly known as: PROTONIX Take 40 mg by mouth daily.   vitamin B-12 1000 MCG tablet Commonly known as:  CYANOCOBALAMIN Take 1,000 mcg by mouth daily.   vitamin C 500 MG tablet Commonly known as: ASCORBIC ACID Take 500 mg by mouth daily.   Vitamin D3 25 MCG (1000 UT) Caps Take 1 capsule by mouth daily.           Objective:   Physical Exam There were no vitals taken for this visit. This was a virtual visit by phone. She is alert oriented x3, sounds weak but not much different from previous days.    Assessment      Assessment  DM- neuropathy (paresthesias, nl pinprick exam), CKD HTN Hyperlipidemia: Lipitor intolerant? CAD? States see Dr Terrence Dupont, no OV  records. (-) stress test 12-2014 CKD -- sees nephrology Gout GERD, nl EGD 01-2015  H/o Depression Leg pain-- RLS vs neuropathy, on gabapentin since 2013, good results H/o anemia --- no iron def , Cscope 2012, normal EGD 01-2015 Breast cancer:  --S/p Surgery (L mastectomy), chemotherapy, XRT --Peripheral neuropathy felt to be due to chemotherapy --Recurrent left arm cellulitis DJD  PLAN Enteritis, failure to thrive, depression, DNR: The patient was discharged to the hospital to SNF after she was admitted with persistent enteritis, did not   stay at the SNF, she is currently at home and  now regretting she left the facility, she is realizing she really needs to be there. Unfortunately she is not taking Augmentin as recommended by the hospital and she does not know exactly what she is supposed to be taking because she lost the discharge summary. She did say she has Lexapro and I encouraged to take it. --I called [phone], a number the pt's husband provided and left a detailed message for the social worker at the [facility] explaining the situation --I called Joanette Gula, Radio broadcast assistant for [phone] and again left a detailed message. --My hope is that she is accepted at the SNF.  If not, I will  ask her to take the antibiotics as prescribed if.  Also will consult palliative care. Advised her to call 911 if she feels  worse. She was appreciative of my help.  Face-to-face: >> 45 minutes  Addendum 04/26/2019: --I spoke with Joanette Gula, they would not take the patient back due to leaving AMA --I spoke with Quincy Sheehan, the social worker, we will try to get another place for her.  She was very helpful and I appreciate her help.  Other facilities require home health agency referral which was sent. --Also spoke with [name], pt's husband, encourage him to provide the wife with all her regular medications, I sent a prescription for antibiotics to finish her treatment for enteritis. --Finally, I did ask for a urgent referral to palliative care.        I discussed the assessment and treatment plan with the patient. The patient was provided an opportunity to ask questions and all were answered. The patient agreed with the plan and demonstrated an understanding of the instructions.   The patient was advised to call back or seek an in-person evaluation if the symptoms worsen or if the condition fails to improve as anticipated.  I provided  45 minutes of non-face-to-face time during this encounter.  Kathlene November, MD

## 2019-04-25 NOTE — Telephone Encounter (Signed)
Home Health Verbal Orders - Caller/Agency: Reuben Likes // [place] Number: [phone] (secure VM) Requesting OT/PT/Skilled Nursing/Social Work/Speech Therapy: Skilled Nursing and PT Frequency: Eval

## 2019-04-25 NOTE — Telephone Encounter (Signed)
Glenard Haring, I am willing to see her tomorrow but please call them back, she really needs to stay at the SNF, she is very weak.  If hope  she changes her mind

## 2019-04-25 NOTE — Telephone Encounter (Signed)
Returned husband's call. States pt is agreeable to go back to nursing home but has some questions for Dr.Paz. Requesting call from "Dr.Paz's nurse [facility]" or Dr.Paz himself.

## 2019-04-25 NOTE — Telephone Encounter (Signed)
Called pt for TCM/ Hospital follow up. Spouse answered stating pt was sleeping. Spoke w/ him briefly. Reports his wife was sent from hospital to SNF and called him to pick her up against medical advice. States he prefers wife to answer questions with Dr.Paz. Appt scheduled tomorrow 04/26/19 @1120 

## 2019-04-25 NOTE — Telephone Encounter (Signed)
Husband [name] called back, he says that pt is agreeable to stay at [facility].  He is asking for a call back.  Number is [phone]

## 2019-04-26 ENCOUNTER — Telehealth: Payer: Self-pay | Admitting: Internal Medicine

## 2019-04-26 ENCOUNTER — Inpatient Hospital Stay: Payer: Medicare Other | Admitting: Internal Medicine

## 2019-04-26 MED ORDER — AMOXICILLIN-POT CLAVULANATE 875-125 MG PO TABS: 1.0000 | ORAL_TABLET | Freq: Two times a day (BID) | ORAL | 0 refills | Status: DC

## 2019-04-26 NOTE — Telephone Encounter (Signed)
Pts son called stating he needs the antibiotic sent in to new pharmacy so she can get it sooner than through the mail order. Please advise.   [address]  [address] [place] [address]  Phone: [phone] Fax: [phone]  Not a 24 hour pharmacy; exact hours not known.

## 2019-04-26 NOTE — Telephone Encounter (Signed)
Rx faxed

## 2019-04-26 NOTE — Assessment & Plan Note (Signed)
Enteritis, failure to thrive, depression, DNR: The patient was discharged to the hospital to SNF after she was admitted with persistent enteritis, did not   stay at the SNF, she is currently at home and  now regretting she left the facility, she is realizing she really needs to be there. Unfortunately she is not taking Augmentin as recommended by the hospital and she does not know exactly what she is supposed to be taking because she lost the discharge summary. She did say she has Lexapro and I encouraged to take it. --I called [phone], a number the pt's husband provided and left a detailed message for the social worker at the [facility] explaining the situation --I called Joanette Gula, Radio broadcast assistant for [phone] and again left a detailed message. --My hope is that she is accepted at the SNF.  If not, I will ask her to take the antibiotics as prescribed if.  Also will consult palliative care. Advised her to call 911 if she feels worse. She was appreciative of my help.  Face-to-face: >> 45 minutes  Addendum 04/26/2019: --I spoke with Joanette Gula, they would not take the patient back due to leaving AMA --I spoke with Quincy Sheehan, the social worker, we will try to get another place for her.  She was very helpful and I appreciate her help.  Other facilities require home health agency referral which was sent. --Also spoke with [name], pt's husband, encourage him to provide the wife with all her regular medications, I sent a prescription for antibiotics to finish her treatment for enteritis. --Finally, I did ask for a urgent referral to palliative care.

## 2019-04-26 NOTE — TOC Transition Note (Addendum)
Transition of Care [facility]) - CM/SW Discharge Note   Patient Details  Name: Samantha Mckenzie MRN: [id number] Date of Birth: Feb 21, 1936  [facility] CM/SW Contact:  Servando Snare, LCSW Phone Number: 04/26/2019, 1:54 PM   Clinical Narrative: 04/26/2019 2:02 PM  LCSW received a call from patients PCP, Dr. Larose Kells, regarding patients current condition and needing to go to SNF. Patient was dc to SNF on Tuesday and left AMA after 2 hours. Facility will not accept her back and she cannot be placed from home due to needing covid test and 72 hours of vitals. LCSW contacted [place] to see if patient could be placed from home. [facility] asked that PCP submit new referral to reactivate services.     Final next level of care: Skilled Nursing Facility Barriers to Discharge: No Barriers Identified   Patient Goals and CMS Choice Patient states their goals for this hospitalization and ongoing recovery are:: Get up and walk CMS Medicare.gov Compare Post Acute Care list provided to:: Patient Choice offered to / list presented to : Patient  Discharge Placement              Patient chooses bed at: [facility] Patient to be transferred to facility by: EMS Name of family member notified: [name] Patient and family notified of of transfer: 04/24/19  Discharge Plan and Services   Discharge Planning Services: CM Consult            DME Arranged: N/A DME Agency: NA       HH Arranged: NA HH Agency: NA        Social Determinants of Health ([place]) Interventions     Readmission Risk Interventions Readmission Risk Prevention Plan 04/10/2019 03/26/2019  Transportation Screening Complete Complete  PCP or Specialist Appt within 3-5 Days - Complete  HRI or [place] - Complete  Social Work Consult for [place] Planning/Counseling - Complete  Palliative Care Screening - Not Applicable  Medication Review Press photographer) Referral to Pharmacy Complete  PCP or  Specialist appointment within 3-5 days of discharge Complete -  [place] or Home Care Consult Complete -  SW Recovery Care/Counseling Consult Complete -  Palliative Care Screening Not Applicable -  [place] Not Applicable -  Some recent data might be hidden

## 2019-04-27 ENCOUNTER — Other Ambulatory Visit: Payer: TRICARE For Life (TFL) | Admitting: *Deleted

## 2019-04-27 ENCOUNTER — Telehealth: Payer: Self-pay | Admitting: *Deleted

## 2019-04-27 DIAGNOSIS — M109 Gout, unspecified: Secondary | ICD-10-CM | POA: Diagnosis not present

## 2019-04-27 DIAGNOSIS — N184 Chronic kidney disease, stage 4 (severe): Secondary | ICD-10-CM | POA: Diagnosis not present

## 2019-04-27 DIAGNOSIS — J449 Chronic obstructive pulmonary disease, unspecified: Secondary | ICD-10-CM | POA: Diagnosis not present

## 2019-04-27 DIAGNOSIS — F4321 Adjustment disorder with depressed mood: Secondary | ICD-10-CM | POA: Diagnosis not present

## 2019-04-27 DIAGNOSIS — D509 Iron deficiency anemia, unspecified: Secondary | ICD-10-CM | POA: Diagnosis not present

## 2019-04-27 DIAGNOSIS — Z7982 Long term (current) use of aspirin: Secondary | ICD-10-CM | POA: Diagnosis not present

## 2019-04-27 DIAGNOSIS — R296 Repeated falls: Secondary | ICD-10-CM | POA: Diagnosis not present

## 2019-04-27 DIAGNOSIS — K219 Gastro-esophageal reflux disease without esophagitis: Secondary | ICD-10-CM | POA: Diagnosis not present

## 2019-04-27 DIAGNOSIS — I5032 Chronic diastolic (congestive) heart failure: Secondary | ICD-10-CM | POA: Diagnosis not present

## 2019-04-27 DIAGNOSIS — J9621 Acute and chronic respiratory failure with hypoxia: Secondary | ICD-10-CM | POA: Diagnosis not present

## 2019-04-27 DIAGNOSIS — E1142 Type 2 diabetes mellitus with diabetic polyneuropathy: Secondary | ICD-10-CM | POA: Diagnosis not present

## 2019-04-27 DIAGNOSIS — K529 Noninfective gastroenteritis and colitis, unspecified: Secondary | ICD-10-CM | POA: Diagnosis not present

## 2019-04-27 DIAGNOSIS — Z8701 Personal history of pneumonia (recurrent): Secondary | ICD-10-CM | POA: Diagnosis not present

## 2019-04-27 DIAGNOSIS — I130 Hypertensive heart and chronic kidney disease with heart failure and stage 1 through stage 4 chronic kidney disease, or unspecified chronic kidney disease: Secondary | ICD-10-CM | POA: Diagnosis not present

## 2019-04-27 DIAGNOSIS — E1122 Type 2 diabetes mellitus with diabetic chronic kidney disease: Secondary | ICD-10-CM | POA: Diagnosis not present

## 2019-04-27 MED ORDER — AMOXICILLIN-POT CLAVULANATE 875-125 MG PO TABS: 1.0000 | ORAL_TABLET | Freq: Two times a day (BID) | ORAL | 0 refills | Status: AC

## 2019-04-27 NOTE — Telephone Encounter (Signed)
Contacted and spoke with both patient and her husband, Samantha Mckenzie, to schedule a home visit for today. Samantha Mckenzie requested my contact information to call me back with a visit date/time. He says that they have a lot going on right now and need to look at their calendar and see which day would be best. They prefer a visit next week. Husband to call me back with date/time they decide.

## 2019-04-27 NOTE — Addendum Note (Signed)
Addended by: Magdalene Molly A on: 04/27/2019 09:03 AM   Modules accepted: Orders

## 2019-04-29 DIAGNOSIS — K529 Noninfective gastroenteritis and colitis, unspecified: Secondary | ICD-10-CM | POA: Diagnosis not present

## 2019-04-29 DIAGNOSIS — J449 Chronic obstructive pulmonary disease, unspecified: Secondary | ICD-10-CM | POA: Diagnosis not present

## 2019-04-29 DIAGNOSIS — D509 Iron deficiency anemia, unspecified: Secondary | ICD-10-CM | POA: Diagnosis not present

## 2019-04-29 DIAGNOSIS — I130 Hypertensive heart and chronic kidney disease with heart failure and stage 1 through stage 4 chronic kidney disease, or unspecified chronic kidney disease: Secondary | ICD-10-CM | POA: Diagnosis not present

## 2019-04-29 DIAGNOSIS — R296 Repeated falls: Secondary | ICD-10-CM | POA: Diagnosis not present

## 2019-04-29 DIAGNOSIS — J9621 Acute and chronic respiratory failure with hypoxia: Secondary | ICD-10-CM | POA: Diagnosis not present

## 2019-04-30 ENCOUNTER — Other Ambulatory Visit: Payer: Self-pay

## 2019-04-30 ENCOUNTER — Ambulatory Visit: Payer: Medicare Other | Admitting: Internal Medicine

## 2019-04-30 ENCOUNTER — Telehealth: Payer: Self-pay | Admitting: Internal Medicine

## 2019-04-30 DIAGNOSIS — K529 Noninfective gastroenteritis and colitis, unspecified: Secondary | ICD-10-CM | POA: Diagnosis not present

## 2019-04-30 DIAGNOSIS — D509 Iron deficiency anemia, unspecified: Secondary | ICD-10-CM | POA: Diagnosis not present

## 2019-04-30 DIAGNOSIS — J9621 Acute and chronic respiratory failure with hypoxia: Secondary | ICD-10-CM | POA: Diagnosis not present

## 2019-04-30 DIAGNOSIS — R296 Repeated falls: Secondary | ICD-10-CM | POA: Diagnosis not present

## 2019-04-30 DIAGNOSIS — J449 Chronic obstructive pulmonary disease, unspecified: Secondary | ICD-10-CM | POA: Diagnosis not present

## 2019-04-30 DIAGNOSIS — I130 Hypertensive heart and chronic kidney disease with heart failure and stage 1 through stage 4 chronic kidney disease, or unspecified chronic kidney disease: Secondary | ICD-10-CM | POA: Diagnosis not present

## 2019-04-30 NOTE — Telephone Encounter (Signed)
LMOM w/ Darnelle, verbal orders given.

## 2019-04-30 NOTE — Progress Notes (Signed)
Called : no answer @ 11.02 AM Kathlene November, MD

## 2019-04-30 NOTE — Telephone Encounter (Signed)
Home Health Verbal Orders - Caller/AgencySharlyne Pacas Number: [phone] ext [phone] Requesting OT/PT/Skilled Nursing/Social Work/Speech Therapy: Nursing and  Home health aid Frequency: 2w 2 1w 2  Nursing 2w [place] aide

## 2019-04-30 NOTE — Telephone Encounter (Signed)
Copied from [place] [id number]. Topic: Quick Communication - Home Health Verbal Orders >> Apr 30, 2019  4:08 PM Leward Quan A wrote: Caller/Agency: [place] / [place] Number: [phone] ok to LM Requesting OT/PT/Skilled Nursing/Social Work/Speech Therapy: Physical Therapy Frequency: 2 x wk 1 wk, & 1 x wk 3 wks

## 2019-05-01 ENCOUNTER — Telehealth: Payer: Self-pay

## 2019-05-01 ENCOUNTER — Emergency Department (HOSPITAL_COMMUNITY): Payer: Medicare Other

## 2019-05-01 ENCOUNTER — Other Ambulatory Visit: Payer: Self-pay

## 2019-05-01 ENCOUNTER — Encounter (HOSPITAL_COMMUNITY): Payer: Self-pay

## 2019-05-01 ENCOUNTER — Ambulatory Visit: Payer: Medicare Other | Admitting: Internal Medicine

## 2019-05-01 ENCOUNTER — Emergency Department (HOSPITAL_COMMUNITY)
Admission: EM | Admit: 2019-05-01 | Discharge: 2019-05-04 | Payer: Medicare Other | Attending: Emergency Medicine | Admitting: Emergency Medicine

## 2019-05-01 DIAGNOSIS — N184 Chronic kidney disease, stage 4 (severe): Secondary | ICD-10-CM | POA: Insufficient documentation

## 2019-05-01 DIAGNOSIS — R197 Diarrhea, unspecified: Secondary | ICD-10-CM | POA: Insufficient documentation

## 2019-05-01 DIAGNOSIS — M542 Cervicalgia: Secondary | ICD-10-CM | POA: Diagnosis not present

## 2019-05-01 DIAGNOSIS — E1122 Type 2 diabetes mellitus with diabetic chronic kidney disease: Secondary | ICD-10-CM | POA: Insufficient documentation

## 2019-05-01 DIAGNOSIS — Z79899 Other long term (current) drug therapy: Secondary | ICD-10-CM | POA: Diagnosis not present

## 2019-05-01 DIAGNOSIS — Z853 Personal history of malignant neoplasm of breast: Secondary | ICD-10-CM | POA: Insufficient documentation

## 2019-05-01 DIAGNOSIS — R531 Weakness: Secondary | ICD-10-CM | POA: Insufficient documentation

## 2019-05-01 DIAGNOSIS — I130 Hypertensive heart and chronic kidney disease with heart failure and stage 1 through stage 4 chronic kidney disease, or unspecified chronic kidney disease: Secondary | ICD-10-CM | POA: Diagnosis not present

## 2019-05-01 DIAGNOSIS — Z20828 Contact with and (suspected) exposure to other viral communicable diseases: Secondary | ICD-10-CM | POA: Insufficient documentation

## 2019-05-01 DIAGNOSIS — R51 Headache: Secondary | ICD-10-CM | POA: Insufficient documentation

## 2019-05-01 DIAGNOSIS — I5032 Chronic diastolic (congestive) heart failure: Secondary | ICD-10-CM | POA: Insufficient documentation

## 2019-05-01 DIAGNOSIS — Z7982 Long term (current) use of aspirin: Secondary | ICD-10-CM | POA: Insufficient documentation

## 2019-05-01 DIAGNOSIS — J449 Chronic obstructive pulmonary disease, unspecified: Secondary | ICD-10-CM | POA: Insufficient documentation

## 2019-05-01 DIAGNOSIS — S199XXA Unspecified injury of neck, initial encounter: Secondary | ICD-10-CM | POA: Diagnosis not present

## 2019-05-01 DIAGNOSIS — M5030 Other cervical disc degeneration, unspecified cervical region: Secondary | ICD-10-CM | POA: Diagnosis not present

## 2019-05-01 LAB — URINALYSIS, ROUTINE W REFLEX MICROSCOPIC
Bacteria, UA: NONE SEEN
Bilirubin Urine: NEGATIVE
Glucose, UA: NEGATIVE mg/dL
Ketones, ur: NEGATIVE mg/dL
Nitrite: NEGATIVE
Protein, ur: NEGATIVE mg/dL
Specific Gravity, Urine: 1.003 — ABNORMAL LOW (ref 1.005–1.030)
pH: 6 (ref 5.0–8.0)

## 2019-05-01 LAB — SARS CORONAVIRUS 2 BY RT PCR (HOSPITAL ORDER, PERFORMED IN ~~LOC~~ HOSPITAL LAB): SARS Coronavirus 2: NEGATIVE

## 2019-05-01 LAB — COMPREHENSIVE METABOLIC PANEL
ALT: 13 U/L (ref 0–44)
AST: 16 U/L (ref 15–41)
Albumin: 2.1 g/dL — ABNORMAL LOW (ref 3.5–5.0)
Alkaline Phosphatase: 63 U/L (ref 38–126)
Anion gap: 14 (ref 5–15)
BUN: 27 mg/dL — ABNORMAL HIGH (ref 8–23)
CO2: 23 mmol/L (ref 22–32)
Calcium: 8.6 mg/dL — ABNORMAL LOW (ref 8.9–10.3)
Chloride: 95 mmol/L — ABNORMAL LOW (ref 98–111)
Creatinine, Ser: 2.19 mg/dL — ABNORMAL HIGH (ref 0.44–1.00)
GFR calc Af Amer: 24 mL/min — ABNORMAL LOW (ref >60)
GFR calc non Af Amer: 21 mL/min — ABNORMAL LOW (ref >60)
Glucose, Bld: 109 mg/dL — ABNORMAL HIGH (ref 70–99)
Potassium: 4 mmol/L (ref 3.5–5.1)
Sodium: 132 mmol/L — ABNORMAL LOW (ref 135–145)
Total Bilirubin: 0.8 mg/dL (ref 0.3–1.2)
Total Protein: 6.7 g/dL (ref 6.5–8.1)

## 2019-05-01 LAB — CBC WITH DIFFERENTIAL/PLATELET
Abs Immature Granulocytes: 0.03 10*3/uL (ref 0.00–0.07)
Basophils Absolute: 0 10*3/uL (ref 0.0–0.1)
Basophils Relative: 1 %
Eosinophils Absolute: 0.1 10*3/uL (ref 0.0–0.5)
Eosinophils Relative: 1 %
HCT: 28.3 % — ABNORMAL LOW (ref 36.0–46.0)
Hemoglobin: 9.3 g/dL — ABNORMAL LOW (ref 12.0–15.0)
Immature Granulocytes: 1 %
Lymphocytes Relative: 12 %
Lymphs Abs: 0.7 10*3/uL (ref 0.7–4.0)
MCH: 29.2 pg (ref 26.0–34.0)
MCHC: 32.9 g/dL (ref 30.0–36.0)
MCV: 88.7 fL (ref 80.0–100.0)
Monocytes Absolute: 0.5 10*3/uL (ref 0.1–1.0)
Monocytes Relative: 10 %
Neutro Abs: 4.3 10*3/uL (ref 1.7–7.7)
Neutrophils Relative %: 75 %
Platelets: 364 10*3/uL (ref 150–400)
RBC: 3.19 MIL/uL — ABNORMAL LOW (ref 3.87–5.11)
RDW: 19.3 % — ABNORMAL HIGH (ref 11.5–15.5)
WBC: 5.6 10*3/uL (ref 4.0–10.5)
nRBC: 0 % (ref 0.0–0.2)

## 2019-05-01 LAB — MAGNESIUM: Magnesium: 1.5 mg/dL — ABNORMAL LOW (ref 1.7–2.4)

## 2019-05-01 MED ORDER — METOPROLOL TARTRATE 25 MG PO TABS
25.0000 mg | ORAL_TABLET | Freq: Two times a day (BID) | ORAL | Status: DC
  Administered 2019-05-01 – 2019-05-04 (×7): 25 mg via ORAL
  Filled 2019-05-01 (×7): qty 1

## 2019-05-01 MED ORDER — AMOXICILLIN-POT CLAVULANATE 875-125 MG PO TABS
1.0000 | ORAL_TABLET | Freq: Two times a day (BID) | ORAL | Status: DC
  Administered 2019-05-01 – 2019-05-04 (×6): 1 via ORAL
  Filled 2019-05-01 (×6): qty 1

## 2019-05-01 MED ORDER — ASPIRIN 81 MG PO CHEW
81.0000 mg | CHEWABLE_TABLET | Freq: Every day | ORAL | Status: DC
  Administered 2019-05-02 – 2019-05-04 (×3): 81 mg via ORAL
  Filled 2019-05-01 (×3): qty 1

## 2019-05-01 MED ORDER — AMLODIPINE BESYLATE 5 MG PO TABS
10.0000 mg | ORAL_TABLET | Freq: Every day | ORAL | Status: DC
  Administered 2019-05-01 – 2019-05-04 (×4): 10 mg via ORAL
  Filled 2019-05-01 (×4): qty 2

## 2019-05-01 MED ORDER — SODIUM CHLORIDE 0.9 % IV BOLUS
500.0000 mL | Freq: Once | INTRAVENOUS | Status: AC
  Administered 2019-05-01: 500 mL via INTRAVENOUS

## 2019-05-01 MED ORDER — PANTOPRAZOLE SODIUM 40 MG PO TBEC
40.0000 mg | DELAYED_RELEASE_TABLET | Freq: Every day | ORAL | Status: DC
  Administered 2019-05-01 – 2019-05-04 (×4): 40 mg via ORAL
  Filled 2019-05-01 (×4): qty 1

## 2019-05-01 MED ORDER — ACETAMINOPHEN 325 MG PO TABS
650.0000 mg | ORAL_TABLET | ORAL | Status: DC | PRN
  Administered 2019-05-02 – 2019-05-03 (×3): 650 mg via ORAL
  Filled 2019-05-01 (×3): qty 2

## 2019-05-01 MED ORDER — ATORVASTATIN CALCIUM 10 MG PO TABS
20.0000 mg | ORAL_TABLET | Freq: Every day | ORAL | Status: DC
  Administered 2019-05-02 – 2019-05-04 (×3): 20 mg via ORAL
  Filled 2019-05-01 (×3): qty 2

## 2019-05-01 MED ORDER — MAGNESIUM SULFATE IN D5W 1-5 GM/100ML-% IV SOLN
1.0000 g | Freq: Once | INTRAVENOUS | Status: AC
  Administered 2019-05-01: 1 g via INTRAVENOUS
  Filled 2019-05-01: qty 100

## 2019-05-01 NOTE — Evaluation (Signed)
Physical Therapy Evaluation Patient Details Name: Samantha Mckenzie MRN: [id number] DOB: 1935/10/20 Today's Date: 05/01/2019   History of Present Illness  Pt is an 83 y/o female presenting to the ED following increased neck pain and falls at home. CT of Cspine and head negative for acute abnormality. Pt with recent admission to [facility] following fall and weakness. PMH includes CKD, breast cancer, COPD, and DM.   Clinical Impression  Pt presenting with problem above with deficits below. Pt limited this session secondary to increased neck pain and only able to perform side steps at EOB. Required mod A for bed mobility and min A for transfers and side stepping. Pt reports she has had multiple falls at home and has been unable to care for herself. Pt's husband unable to physically assist pt. Feel pt would benefit from SNF level therapies at d/c to increase independence and safety prior to return home. Will continue to follow acutely to maximize functional mobility independence and safety.     Follow Up Recommendations SNF;Supervision/Assistance - 24 hour    Equipment Recommendations  None recommended by PT    Recommendations for Other Services       Precautions / Restrictions Precautions Precautions: Fall Precaution Comments: Pt reporting at least 4 falls within the past couple weeks.  Restrictions Weight Bearing Restrictions: No      Mobility  Bed Mobility Overal bed mobility: Needs Assistance Bed Mobility: Rolling;Sidelying to Sit;Sit to Sidelying Rolling: Mod assist Sidelying to sit: Mod assist     Sit to sidelying: Mod assist General bed mobility comments: Mod A for assist with rolling to side and for LE assist and trunk elevation during bed mobility. Cues for log roll technique to help with pain management.   Transfers Overall transfer level: Needs assistance Equipment used: 1 person hand held assist Transfers: Sit to/from Stand Sit to Stand: Min assist;From elevated surface         General transfer comment: Min A for lift assist from elevated stretcher height. PT stood in front of pt so pt could hold onto PT arms.   Ambulation/Gait Ambulation/Gait assistance: Min assist   Assistive device: 1 person hand held assist       General Gait Details: Pt only able to take side steps at edge of stretcher secondary to increased pain. Min A for steadying and pt holding onto PT arms for steadying. Pt with slowed processing and requiring cues to take side steps.   Stairs            Wheelchair Mobility    Modified Rankin (Stroke Patients Only)       Balance Overall balance assessment: Needs assistance Sitting-balance support: No upper extremity supported;Feet supported Sitting balance-Leahy Scale: Fair     Standing balance support: Bilateral upper extremity supported;During functional activity Standing balance-Leahy Scale: Poor Standing balance comment: Reliant on BUE support                              Pertinent Vitals/Pain Pain Assessment: 0-10 Pain Score: 10-Worst pain ever Pain Location: neck and back Pain Descriptors / Indicators: Aching;Grimacing;Guarding Pain Intervention(s): Limited activity within patient's tolerance;Monitored during session;Repositioned    Home Living Family/patient expects to be discharged to:: Skilled nursing facility Living Arrangements: Spouse/significant other Available Help at Discharge: Family;Available PRN/intermittently Type of Home: House Home Access: Stairs to enter Entrance Stairs-Rails: None Entrance Stairs-Number of Steps: 1 Home Layout: One level Home Equipment: Walker - 2 wheels;Walker -  4 wheels      Prior Function Level of Independence: Needs assistance   Gait / Transfers Assistance Needed: Pt reports using RW for ambulation, however, has had multiple falls at home.   ADL's / Homemaking Assistance Needed: Pt reports she has been unable to perform ADL tasks at home independently  secondary to pain in back and neck.         Hand Dominance        Extremity/Trunk Assessment   Upper Extremity Assessment Upper Extremity Assessment: Defer to OT evaluation    Lower Extremity Assessment Lower Extremity Assessment: Generalized weakness    Cervical / Trunk Assessment Cervical / Trunk Assessment: Other exceptions Cervical / Trunk Exceptions: Reports increased neck pain and stiffness   Communication   Communication: HOH  Cognition Arousal/Alertness: Awake/alert Behavior During Therapy: WFL for tasks assessed/performed Overall Cognitive Status: No family/caregiver present to determine baseline cognitive functioning                                 General Comments: Pt with some slowed processing and difficulty sequencing.       General Comments      Exercises     Assessment/Plan    PT Assessment Patient needs continued PT services  PT Problem List Decreased strength;Decreased activity tolerance;Decreased balance;Decreased mobility;Decreased knowledge of use of DME;Decreased knowledge of precautions;Decreased safety awareness;Decreased cognition;Pain       PT Treatment Interventions DME instruction;Gait training;Stair training;Functional mobility training;Therapeutic activities;Therapeutic exercise;Balance training;Patient/family education    PT Goals (Current goals can be found in the Care Plan section)  Acute Rehab PT Goals Patient Stated Goal: to decrease pain PT Goal Formulation: With patient Time For Goal Achievement: 05/15/19 Potential to Achieve Goals: Fair    Frequency Min 2X/week   Barriers to discharge Decreased caregiver support Husband unable to physically assist pt    Co-evaluation               AM-PAC PT "6 Clicks" Mobility  Outcome Measure Help needed turning from your back to your side while in a flat bed without using bedrails?: A Lot Help needed moving from lying on your back to sitting on the side of a  flat bed without using bedrails?: A Lot Help needed moving to and from a bed to a chair (including a wheelchair)?: A Little Help needed standing up from a chair using your arms (e.g., wheelchair or bedside chair)?: A Little Help needed to walk in hospital room?: A Lot Help needed climbing 3-5 steps with a railing? : A Lot 6 Click Score: 14    End of Session Equipment Utilized During Treatment: Gait belt Activity Tolerance: Patient limited by pain Patient left: in bed;with call bell/phone within reach Nurse Communication: Mobility status;Patient requests pain meds PT Visit Diagnosis: Muscle weakness (generalized) (M62.81);History of falling (Z91.81);Repeated falls (R29.6);Unsteadiness on feet (R26.81)    Time: 6789-3810 PT Time Calculation (min) (ACUTE ONLY): 15 min   Charges:   PT Evaluation $PT Eval Moderate Complexity: 1 Mod          Leighton Ruff, PT, DPT  Acute Rehabilitation Services  Pager: [phone] Office: [phone]   Rudean Hitt 05/01/2019, 3:54 PM

## 2019-05-01 NOTE — ED Notes (Signed)
Called social worker asked plan for patient. Stated still has not seen patient in the ED.

## 2019-05-01 NOTE — ED Notes (Signed)
Attempted to obtain urine specimen; Pt unable to provide one at this time

## 2019-05-01 NOTE — ED Provider Notes (Addendum)
[facility] EMERGENCY DEPARTMENT Provider Note   CSN: [id number] Arrival date & time: 05/01/19  4888    History   Chief Complaint Chief Complaint  Patient presents with  . Neck Pain  . Diarrhea    HPI Samantha Mckenzie is a 83 y.o. female.     HPI Patient is had several weeks of diarrhea.  States she is currently taking antibiotics for enteritis and her abdominal pain is improved.  Continues to have generalized weakness.  States she fell several weeks ago and hit the back of her head.  She had worsening posterior headache and neck pain for the past several days.  She denies any focal weakness or numbness.  Denies fever or chills.  Was recently transferred to SNF but left AMA.  Her primary doctor is trying to arrange her to go back to a nursing facility. Past Medical History:  Diagnosis Date  . Allergic rhinitis   . Anemia   . Breast CA ([place])    surgery, chemo, XRT; had peripheral neuropathy (imbalance at times) after chemo  . Cellulitis    Left arm, recurrent   . Cellulitis LEFT arm recurrent 12/08/2011  . Chronic renal disease, stage IV (HCC)    Dr. Moshe Cipro  . Depression   . Fatigue   . GERD (gastroesophageal reflux disease)    gastritis, EGD 02/2007  . Gout   . Hyperlipidemia   . Hypertension   . Macular degeneration 2011  . Normal cardiac stress test 12/2014  . Osteoarthritis   . Renal insufficiency     Patient Active Problem List   Diagnosis Date Noted  . Ileitis, terminal ([place]) 04/22/2019  . COPD (chronic obstructive pulmonary disease) ([place]) 04/12/2019  . Enteritis 04/07/2019  . Chronic diastolic CHF (congestive heart failure) ([place]) 04/07/2019  . Hypokalemia 04/07/2019  . Hypocalcemia 04/07/2019  . Fall 04/07/2019  . Occult blood positive stool 04/07/2019  . Adjustment disorder with depressed mood   . Generalized abdominal pain   . Dysphasia 03/21/2019  . CAP (community acquired pneumonia) 03/20/2019  . Nausea vomiting and diarrhea  03/20/2019  . Acute respiratory failure with hypoxia ([place]) 03/20/2019  . Acute renal failure superimposed on stage 4 chronic kidney disease ([place]) 03/20/2019  . Lactic acidosis 03/20/2019  . Diabetic peripheral neuropathy associated with type 2 diabetes mellitus ([place]) 03/20/2019  . Hypomagnesemia 06/21/2018  . Generalized weakness 06/20/2018  . PCP NOTES >>> 07/11/2015  . Depression   . VITAMIN B12 DEFICIENCY 04/01/2008  . GERD 12/19/2007  . NEOP, MALIGNANT, FEMALE BREAST NOS 07/07/2007  . Osteoarthritis 07/07/2007  . Diabetes mellitus type 2 with complications ([place]) 91/69/4503  . Hyperlipidemia 02/28/2007  . GOUT 02/28/2007  . Normocytic anemia 02/28/2007  . Essential hypertension 02/28/2007    Past Surgical History:  Procedure Laterality Date  . cataracts bilaterally  1 -2012  . LUNG BIOPSY  1999   neg  . MASTECTOMY Left 1999   and lymphnodes      OB History   No obstetric history on file.      Home Medications    Prior to Admission medications   Medication Sig Start Date End Date Taking? Authorizing Provider  acetaminophen (TYLENOL) 325 MG tablet Take 2 tablets (650 mg total) by mouth every 6 (six) hours as needed for mild pain, fever or headache (or Fever >/= 101). 04/12/19  Yes Emokpae, Courage, MD  albuterol (PROVENTIL) (2.5 MG/3ML) 0.083% nebulizer solution Take 3 mLs (2.5 mg total) by nebulization every 4 (four)  hours as needed for wheezing or shortness of breath. 04/12/19  Yes Emokpae, Courage, MD  allopurinol (ZYLOPRIM) 100 MG tablet Take 1 tablet (100 mg total) by mouth daily. 03/19/19  Yes Paz, Alda Berthold, MD  amLODipine (NORVASC) 10 MG tablet Take 1 tablet (10 mg total) by mouth daily. 03/06/19  Yes Colon Branch, MD  aspirin 81 MG tablet Take 1 tablet (81 mg total) by mouth daily with breakfast. 04/12/19  Yes Emokpae, Courage, MD  atorvastatin (LIPITOR) 20 MG tablet Take 1 tablet (20 mg total) by mouth daily. 01/31/19  Yes Paz, Alda Berthold, MD  Calcium Carb-Cholecalciferol  (CALTRATE 600+D3 PO) Take 1 tablet by mouth daily.   Yes [provider]  Cholecalciferol (VITAMIN D3) 1000 UNITS CAPS Take 1 capsule by mouth daily.    Yes [provider]  dicyclomine (BENTYL) 10 MG capsule Take 2 capsules (20 mg total) by mouth 4 (four) times daily -  before meals and at bedtime. 04/12/19 05/12/19 Yes Emokpae, Courage, MD  hydrOXYzine (ATARAX/VISTARIL) 10 MG tablet Take 1 tablet (10 mg total) by mouth 3 (three) times daily as needed for nausea. 04/12/19  Yes Emokpae, Courage, MD  loperamide (IMODIUM A-D) 2 MG tablet Take 2 mg by mouth 4 (four) times daily as needed for diarrhea or loose stools.   Yes [provider]  metoprolol tartrate (LOPRESSOR) 25 MG tablet Take 1 tablet (25 mg total) by mouth 2 (two) times daily. 04/12/19  Yes Emokpae, Courage, MD  Multiple Vitamins-Minerals (CENTRUM SILVER PO) Take 1 tablet by mouth daily.    Yes [provider]  ondansetron (ZOFRAN-ODT) 4 MG disintegrating tablet Take 1 tablet (4 mg total) by mouth every 6 (six) hours as needed for nausea or vomiting. 04/23/19  Yes Rizwan, Eunice Blase, MD  pantoprazole (PROTONIX) 40 MG tablet Take 40 mg by mouth daily.   Yes [provider]  vitamin B-12 (CYANOCOBALAMIN) 1000 MCG tablet Take 1,000 mcg by mouth daily.     Yes [provider]  vitamin C (ASCORBIC ACID) 500 MG tablet Take 500 mg by mouth daily.   Yes [provider]  escitalopram (LEXAPRO) 5 MG tablet 1 tablet daily for 1 week, then 2 tablets daily Patient taking differently: Take 5-10 mg by mouth See admin instructions. Take one tablet daily for 7 days then take two tablets daily. 04/16/19   Colon Branch, MD    Family History Family History  Problem Relation Age of Onset  . Breast cancer Other        ? of   . Heart attack Father 79  . Diabetes Sister        2  . Hypertension Sister   . Colon cancer Neg Hx   . Stomach cancer Neg Hx     Social History Social History   Tobacco Use  .  Smoking status: Never Smoker  . Smokeless tobacco: Never Used  Substance Use Topics  . Alcohol use: Not Currently    Alcohol/week: 0.0 standard drinks    Comment: rarely  . Drug use: No     Allergies   Codeine, Morphine, Oxycodone-acetaminophen, Sulfonamide derivatives, and Tramadol hcl   Review of Systems Review of Systems  Constitutional: Positive for appetite change and fatigue. Negative for chills and fever.  HENT: Negative for sore throat and trouble swallowing.   Eyes: Negative for visual disturbance.  Respiratory: Negative for shortness of breath.   Cardiovascular: Negative for chest pain.  Gastrointestinal: Positive for diarrhea. Negative for abdominal  pain, blood in stool, constipation, nausea and vomiting.  Genitourinary: Negative for dysuria and flank pain.  Musculoskeletal: Positive for neck pain. Negative for back pain.  Skin: Negative for rash and wound.  Neurological: Positive for headaches. Negative for syncope, weakness and numbness.  All other systems reviewed and are negative.    Physical Exam Updated Vital Signs BP (!) 132/50 (BP Location: Right Arm)   Pulse 72   Temp 98.7 F (37.1 C) (Oral)   Resp 16   Ht 5' 4"  (1.626 m)   Wt 59 kg   SpO2 95%   BMI 22.31 kg/m   Physical Exam Vitals signs and nursing note reviewed.  Constitutional:      Appearance: Normal appearance. She is well-developed.  HENT:     Head: Normocephalic and atraumatic.     Comments: Mild occipital tenderness to palpation.  No obvious trauma    Nose: Nose normal.     Mouth/Throat:     Mouth: Mucous membranes are moist.  Eyes:     Pupils: Pupils are equal, round, and reactive to light.  Neck:     Musculoskeletal: Normal range of motion and neck supple.     Comments: No definite midline pinpoint tenderness. Cardiovascular:     Rate and Rhythm: Normal rate and regular rhythm.     Heart sounds: No murmur. No friction rub. No gallop.   Pulmonary:     Effort: Pulmonary effort  is normal. No respiratory distress.     Breath sounds: Normal breath sounds. No stridor. No wheezing, rhonchi or rales.  Chest:     Chest wall: No tenderness.  Abdominal:     General: Bowel sounds are normal.     Palpations: Abdomen is soft.     Tenderness: There is no abdominal tenderness. There is no guarding or rebound.  Musculoskeletal: Normal range of motion.        General: No swelling, tenderness, deformity or signs of injury.     Right lower leg: No edema.  Skin:    General: Skin is warm and dry.     Findings: No erythema or rash.  Neurological:     General: No focal deficit present.     Mental Status: She is alert and oriented to person, place, and time.      ED Treatments / Results  Labs (all labs ordered are listed, but only abnormal results are displayed) Labs Reviewed  CBC WITH DIFFERENTIAL/PLATELET - Abnormal; Notable for the following components:      Result Value   RBC 3.19 (*)    Hemoglobin 9.3 (*)    HCT 28.3 (*)    RDW 19.3 (*)    All other components within normal limits  COMPREHENSIVE METABOLIC PANEL - Abnormal; Notable for the following components:   Sodium 132 (*)    Chloride 95 (*)    Glucose, Bld 109 (*)    BUN 27 (*)    Creatinine, Ser 2.19 (*)    Calcium 8.6 (*)    Albumin 2.1 (*)    GFR calc non Af Amer 21 (*)    GFR calc Af Amer 24 (*)    All other components within normal limits  URINALYSIS, ROUTINE W REFLEX MICROSCOPIC - Abnormal; Notable for the following components:   Color, Urine STRAW (*)    Specific Gravity, Urine 1.003 (*)    Hgb urine dipstick SMALL (*)    Leukocytes,Ua TRACE (*)    All other components within normal limits  MAGNESIUM -  Abnormal; Notable for the following components:   Magnesium 1.5 (*)    All other components within normal limits  SARS CORONAVIRUS 2 (HOSPITAL ORDER, [facility])    EKG EKG Interpretation  Date/Time:  Tuesday May 01 2019 08:34:00 EDT Ventricular Rate:  70 PR  Interval:    QRS Duration: 89 QT Interval:  392 QTC Calculation: 423 R Axis:   -10 Text Interpretation:  Sinus rhythm Left ventricular hypertrophy Inferior infarct, age indeterminate Anterior Q waves, possibly due to LVH Confirmed by Julianne Rice [phone]) on 05/01/2019 9:11:32 AM Also confirmed by Julianne Rice [phone]), editor Philomena Doheny [phone])  on 05/02/2019 8:13:10 AM   Radiology No results found.  Procedures Procedures (including critical care time)  Medications Ordered in ED Medications  magnesium sulfate IVPB 1 g 100 mL (0 g Intravenous Stopped 05/01/19 1300)  sodium chloride 0.9 % bolus 500 mL (0 mLs Intravenous Stopped 05/01/19 1300)  HYDROcodone-acetaminophen (NORCO/VICODIN) 5-325 MG per tablet 1 tablet (1 tablet Oral Given 05/03/19 2245)  ondansetron (ZOFRAN) tablet 4 mg (4 mg Oral Given 05/03/19 2206)     Initial Impression / Assessment and Plan / ED Course  I have reviewed the triage vital signs and the nursing notes.  Pertinent labs & imaging results that were available during my care of the patient were reviewed by me and considered in my medical decision making (see chart for details).  Clinical Course as of May 05 2135  Tue May 01, 2019  1901 Nursing reached out to social work because PT consult was done already and is recommending inpatient.  Social work is going to contact family and find out if they are considering rehab versus her going home.   [MB]  1935 It sounds like the patient might be a border here tonight.  Social work was going to update patient and family.   [MB]    Clinical Course User Index [MB] Hayden Rasmussen, MD      Work-up is essentially negative.  No definite criteria for admission.  Discussed with patient's primary physician, Dr. Larose Kells.  Will have social work evaluate for possible SNF placement.  Asked to have PT and OT consult on patient. Signed out to oncoming EMP   Final Clinical Impressions(s) / ED Diagnoses   Final diagnoses:   General weakness  Diarrhea, unspecified type    ED Discharge Orders    None       Julianne Rice, MD 05/01/19 1459    Julianne Rice, MD 05/05/19 2136

## 2019-05-01 NOTE — Progress Notes (Signed)
CSW received consult for patient for possible placement. CSW reviewed chart, patient was recently discharged from [facility] last week 7/14 to [facility]. Patient was only at the facility for two hours and left AMA. CSW spoke with Marya Amsler, RN who agreed to speak with the MD to have PT/OT orders placed to evaluate this patient. CSW will continue following to assist with discharge planning.  Madilyn Fireman, MSW, LCSW-A Clinical Social Worker [facility] [phone]

## 2019-05-01 NOTE — ED Notes (Signed)
Called PT and confirmed ordered is in Epic and will assigned a therapist.

## 2019-05-01 NOTE — Progress Notes (Addendum)
7:50PM: CSW received verbal consent to fax out to SNF providers with a preference around [place] if possible. CSW noted consent to send nearby to [facility] additionally. CSW awaiting bed offers. CSW attempted to contact spouse a second time to provide update. CSW noted no answer and no voicemail on both numbers.  7:20PM: CSW attempted to contact spouse and noted phone was busy. CSW attempted other family contact number and noted line was busy.  Patient not currently a resident at [facility]. Will need new TOC assessment, FL-2 and will need to be re-referred out. Attaining bed offers at this time not likely to occur, CSW will pursue as possible.  Lamonte Richer, LCSW, [place] Worker II [phone]

## 2019-05-01 NOTE — TOC Initial Note (Signed)
Transition of Care [facility]) - Initial/Assessment Note    Patient Details  Name: Samantha Mckenzie MRN: [id number] Date of Birth: 20-Sep-1936  Transition of Care [facility]) CM/SW Contact:    Oretha Milch, LCSW Phone Number: 05/01/2019, 7:29 PM  Clinical Narrative: Patient is currently at [facility] ED after experiencing pain and reported diminishing ability to move neck without significant pain. Patient had initially been with [facility] until her discharge to [facility]. CSW noted per documentation patient had left [facility] after two hours and has been at her residence from 7/14 until current ED visit. Patient and family have acknowledged the need for SNF support for the patient and stated they are willing to return to [facility]. CSW has contacted [facility] and noted patient is not currently a resident and will need to be referred out as a new referral. CSW will continue to follow for discharge supports.              Expected Discharge Plan: Skilled Nursing Facility Barriers to Discharge: Ship broker, Unsafe home situation   Patient Goals and CMS Choice Patient states their goals for this hospitalization and ongoing recovery are:: "I want to be able to go home." CMS Medicare.gov Compare Post Acute Care list provided to:: Patient Choice offered to / list presented to : Patient  Expected Discharge Plan and Services Expected Discharge Plan: [place]   Discharge Planning Services: NA Post Acute Care Choice: [place] Living arrangements for the past 2 months: Single Family Home                                   Representative spoke with at [place]: N/A  Prior Living Arrangements/Services Living arrangements for the past 2 months: [place] with:: Spouse Patient language and need for interpreter reviewed:: Yes Do you feel safe going back to the place where you live?: No   Patient left SNF AMA and was unable to  maintain healthy, safe, living at home.  Need for Family Participation in Patient Care: No (Comment) Care giver support system in place?: No (comment)   Criminal Activity/Legal Involvement Pertinent to Current Situation/Hospitalization: No - Comment as needed  Activities of Daily Living      Permission Sought/Granted Permission sought to share information with : Chartered certified accountant granted to share information with : Yes, Verbal Permission Granted     Permission granted to share info w AGENCY: Facility contacts        Emotional Assessment Appearance:: Appears stated age     Orientation: : Oriented to Self, Oriented to Place, Oriented to  Time, Oriented to Situation Alcohol / Substance Use: Not Applicable Psych Involvement: No (comment)  Admission diagnosis:  Diarrhea, not eating, weakness, sharp neck pain Patient Active Problem List   Diagnosis Date Noted  . Ileitis, terminal ([place]) 04/22/2019  . COPD (chronic obstructive pulmonary disease) ([place]) 04/12/2019  . Enteritis 04/07/2019  . Chronic diastolic CHF (congestive heart failure) ([place]) 04/07/2019  . Hypokalemia 04/07/2019  . Hypocalcemia 04/07/2019  . Fall 04/07/2019  . Occult blood positive stool 04/07/2019  . Adjustment disorder with depressed mood   . Generalized abdominal pain   . Dysphasia 03/21/2019  . CAP (community acquired pneumonia) 03/20/2019  . Nausea vomiting and diarrhea 03/20/2019  . Acute respiratory failure with hypoxia ([place]) 03/20/2019  . Acute renal failure superimposed on stage 4 chronic  kidney disease ([place]) 03/20/2019  . Lactic acidosis 03/20/2019  . Diabetic peripheral neuropathy associated with type 2 diabetes mellitus ([place]) 03/20/2019  . Hypomagnesemia 06/21/2018  . Generalized weakness 06/20/2018  . PCP NOTES >>> 07/11/2015  . Depression   . VITAMIN B12 DEFICIENCY 04/01/2008  . GERD 12/19/2007  . NEOP, MALIGNANT, FEMALE BREAST NOS 07/07/2007  . Osteoarthritis  07/07/2007  . Diabetes mellitus type 2 with complications ([place]) 54/98/2641  . Hyperlipidemia 02/28/2007  . GOUT 02/28/2007  . Normocytic anemia 02/28/2007  . Essential hypertension 02/28/2007   PCP:  Colon Branch, MD Pharmacy:   [facility], [place] [address] [place] [address] Phone: [phone] Fax: [facility] [address] [place]. [place]. [place] [address] Phone: [phone] Fax: [phone]     Social Determinants of Health (SDOH) Interventions    Readmission Risk Interventions Readmission Risk Prevention Plan 04/10/2019 03/26/2019  Transportation Screening Complete Complete  PCP or Specialist Appt within 3-5 Days - Complete  HRI or [place] - Complete  Social Work Consult for [place] Planning/Counseling - Complete  Palliative Care Screening - Not Applicable  Medication Review Press photographer) Referral to Pharmacy Complete  PCP or Specialist appointment within 3-5 days of discharge Complete -  [place] or Home Care Consult Complete -  SW Recovery Care/Counseling Consult Complete -  Palliative Care Screening Not Applicable -  [place] Not Applicable -  Some recent data might be hidden

## 2019-05-01 NOTE — ED Notes (Signed)
PT at bedside.

## 2019-05-01 NOTE — Telephone Encounter (Signed)
Spoke w/ [id number]- verbal orders given.

## 2019-05-01 NOTE — Telephone Encounter (Signed)
Patient is currently at the ER, will wait for ER work-up

## 2019-05-01 NOTE — Care Management (Signed)
CSW is working on STR placement from the ED. CSW will continue to follow for safe transitional care plan.

## 2019-05-01 NOTE — Telephone Encounter (Signed)
Copied from [place] [phone]. Topic: General - Call Back - No Documentation >> May 01, 2019  8:43 AM Erick Blinks wrote: Reason for CRM: Pt's husband is requesting call back from PCP or anyone clinical regarding.  Best Contact: [phone]

## 2019-05-01 NOTE — ED Provider Notes (Signed)
Signout from Dr. Lita Mains. 83 y/o female with recent diarrhea, now generalized weakness. Was in SNF and left AMA. Needs more help than family can provide.  Physical Exam  BP (!) 134/49 (BP Location: Right Arm)   Pulse 82   Temp 98.1 F (36.7 C) (Oral)   Resp 18   Ht 5' 4"  (1.626 m)   Wt 59 kg   SpO2 99%   BMI 22.31 kg/m   Physical Exam  ED Course/Procedures   Clinical Course as of May 01 1935  Tue May 01, 2019  1901 Nursing reached out to social work because PT consult was done already and is recommending inpatient.  Social work is going to contact family and find out if they are considering rehab versus her going home.   [MB]  1935 It sounds like the patient might be a border here tonight.  Social work was going to update patient and family.   [MB]    Clinical Course User Index [MB] Hayden Rasmussen, MD    Procedures  MDM  Plan is follow up on social work and PT recommendations.       Hayden Rasmussen, MD 05/02/19 [phone]

## 2019-05-01 NOTE — ED Triage Notes (Addendum)
Pt from home; c/o pain to back of neck that began 1 week ago; pt states she had a mechanical fall a couple of weeks ago, hit back of head, required stiches; pt states it has become more difficult to turn her head; pt also c/o diarrhea x "a few weeks" and decreased appetite; denies abd pain, N/V; denies sick contacts

## 2019-05-01 NOTE — NC FL2 (Signed)
[place] MEDICAID FL2 LEVEL OF CARE SCREENING TOOL     IDENTIFICATION  Patient Name: Samantha Mckenzie Birthdate: 01-15-1936 Sex: female Admission Date (Current Location): 05/01/2019  [facility] and [place] Number:  Herbalist and Address:  [facility] [place], [place] [address], [place], [place] [address]      Provider Number: [id number]  Attending Physician Name and Address:  Hayden Rasmussen, MD  Relative Name and Phone Number:  Kaelah Hayashi, spouse, [phone]    Current Level of Care: Hospital Recommended Level of Care: [place] Prior Approval Number:    Date Approved/Denied:   PASRR Number: [id number]  Discharge Plan: SNF    Current Diagnoses: Patient Active Problem List   Diagnosis Date Noted  . Ileitis, terminal ([place]) 04/22/2019  . COPD (chronic obstructive pulmonary disease) ([place]) 04/12/2019  . Enteritis 04/07/2019  . Chronic diastolic CHF (congestive heart failure) ([place]) 04/07/2019  . Hypokalemia 04/07/2019  . Hypocalcemia 04/07/2019  . Fall 04/07/2019  . Occult blood positive stool 04/07/2019  . Adjustment disorder with depressed mood   . Generalized abdominal pain   . Dysphasia 03/21/2019  . CAP (community acquired pneumonia) 03/20/2019  . Nausea vomiting and diarrhea 03/20/2019  . Acute respiratory failure with hypoxia ([place]) 03/20/2019  . Acute renal failure superimposed on stage 4 chronic kidney disease ([place]) 03/20/2019  . Lactic acidosis 03/20/2019  . Diabetic peripheral neuropathy associated with type 2 diabetes mellitus ([place]) 03/20/2019  . Hypomagnesemia 06/21/2018  . Generalized weakness 06/20/2018  . PCP NOTES >>> 07/11/2015  . Depression   . VITAMIN B12 DEFICIENCY 04/01/2008  . GERD 12/19/2007  . NEOP, MALIGNANT, FEMALE BREAST NOS 07/07/2007  . Osteoarthritis 07/07/2007  . Diabetes mellitus type 2 with complications ([place]) 05/69/7948  . Hyperlipidemia 02/28/2007  . GOUT 02/28/2007  . Normocytic  anemia 02/28/2007  . Essential hypertension 02/28/2007    Orientation RESPIRATION BLADDER Height & Weight     Self, Time, Situation, Place  Normal Continent Weight: 130 lb (59 kg) Height:  5' 4"  (162.6 cm)  BEHAVIORAL SYMPTOMS/MOOD NEUROLOGICAL BOWEL NUTRITION STATUS      Continent    AMBULATORY STATUS COMMUNICATION OF NEEDS Skin   Extensive Assist Verbally Normal                       Personal Care Assistance Level of Assistance  Bathing, Feeding, Dressing Bathing Assistance: Limited assistance Feeding assistance: Independent Dressing Assistance: Limited assistance     Functional Limitations Info  Sight, Hearing, Speech Sight Info: Impaired Hearing Info: Adequate Speech Info: Adequate    SPECIAL CARE FACTORS FREQUENCY  PT (By licensed PT), OT (By licensed OT)     PT Frequency: 5x Weekly OT Frequency: 5x Weekly            Contractures Contractures Info: Not present    Additional Factors Info  Code Status, Allergies Code Status Info: DNR             Current Medications (05/01/2019):  This is the current hospital active medication list Current Facility-Administered Medications  Medication Dose Route Frequency Provider Last Rate Last Dose  . acetaminophen (TYLENOL) tablet 650 mg  650 mg Oral Q4H PRN Julianne Rice, MD      . amLODipine (NORVASC) tablet 10 mg  10 mg Oral Daily Julianne Rice, MD   10 mg at 05/01/19 1752  . amoxicillin-clavulanate (AUGMENTIN) 875-125 MG per tablet 1 tablet  1 tablet Oral Q12H Julianne Rice, MD  1 tablet at 05/01/19 1754  . [START ON 05/02/2019] aspirin chewable tablet 81 mg  81 mg Oral Q breakfast Julianne Rice, MD      . atorvastatin (LIPITOR) tablet 20 mg  20 mg Oral Daily Julianne Rice, MD      . metoprolol tartrate (LOPRESSOR) tablet 25 mg  25 mg Oral BID Julianne Rice, MD   25 mg at 05/01/19 1754  . pantoprazole (PROTONIX) EC tablet 40 mg  40 mg Oral Daily Julianne Rice, MD   40 mg at 05/01/19 1754    Current Outpatient Medications  Medication Sig Dispense Refill  . acetaminophen (TYLENOL) 325 MG tablet Take 2 tablets (650 mg total) by mouth every 6 (six) hours as needed for mild pain, fever or headache (or Fever >/= 101). 15 tablet 0  . albuterol (PROVENTIL) (2.5 MG/3ML) 0.083% nebulizer solution Take 3 mLs (2.5 mg total) by nebulization every 4 (four) hours as needed for wheezing or shortness of breath. 75 mL 2  . allopurinol (ZYLOPRIM) 100 MG tablet Take 1 tablet (100 mg total) by mouth daily. 90 tablet 1  . amLODipine (NORVASC) 10 MG tablet Take 1 tablet (10 mg total) by mouth daily. 90 tablet 1  . amoxicillin-clavulanate (AUGMENTIN) 875-125 MG tablet Take 1 tablet by mouth every 12 (twelve) hours for 7 days. 14 tablet 0  . aspirin 81 MG tablet Take 1 tablet (81 mg total) by mouth daily with breakfast. 30 tablet 0  . atorvastatin (LIPITOR) 20 MG tablet Take 1 tablet (20 mg total) by mouth daily. 90 tablet 1  . Calcium Carb-Cholecalciferol (CALTRATE 600+D3 PO) Take 1 tablet by mouth daily.    . Cholecalciferol (VITAMIN D3) 1000 UNITS CAPS Take 1 capsule by mouth daily.     Marland Kitchen dicyclomine (BENTYL) 10 MG capsule Take 2 capsules (20 mg total) by mouth 4 (four) times daily -  before meals and at bedtime. 240 capsule 1  . hydrOXYzine (ATARAX/VISTARIL) 10 MG tablet Take 1 tablet (10 mg total) by mouth 3 (three) times daily as needed for nausea. 30 tablet 0  . loperamide (IMODIUM A-D) 2 MG tablet Take 2 mg by mouth 4 (four) times daily as needed for diarrhea or loose stools.    . metoprolol tartrate (LOPRESSOR) 25 MG tablet Take 1 tablet (25 mg total) by mouth 2 (two) times daily. 180 tablet 1  . Multiple Vitamins-Minerals (CENTRUM SILVER PO) Take 1 tablet by mouth daily.     . ondansetron (ZOFRAN-ODT) 4 MG disintegrating tablet Take 1 tablet (4 mg total) by mouth every 6 (six) hours as needed for nausea or vomiting. 30 tablet 0  . pantoprazole (PROTONIX) 40 MG tablet Take 40 mg by mouth daily.     . vitamin B-12 (CYANOCOBALAMIN) 1000 MCG tablet Take 1,000 mcg by mouth daily.      . vitamin C (ASCORBIC ACID) 500 MG tablet Take 500 mg by mouth daily.    Marland Kitchen escitalopram (LEXAPRO) 5 MG tablet 1 tablet daily for 1 week, then 2 tablets daily (Patient taking differently: Take 5-10 mg by mouth See admin instructions. Take one tablet daily for 7 days then take two tablets daily.) 60 tablet 0     Discharge Medications: Please see discharge summary for a list of discharge medications.  Relevant Imaging Results:  Relevant Lab Results:   Additional [place], LCSW

## 2019-05-01 NOTE — ED Notes (Signed)
Spoke with doctor regarding social worker consult. Looking to find nursing home placement. Spoke with Education officer, museum who stated doctor needs to order PT/OT evaluation.

## 2019-05-02 DIAGNOSIS — R197 Diarrhea, unspecified: Secondary | ICD-10-CM | POA: Diagnosis not present

## 2019-05-02 NOTE — ED Notes (Signed)
Message sent to [facility] to retime augmentin for 2000 last given at 0800

## 2019-05-02 NOTE — Telephone Encounter (Signed)
-  Yesterday I talked with Dr. Lita Mains  and made myself available for questions -Today, the remains in the ER, I spoke with her husband, the plan seems to be to admit her to a SNF. I completely agree, the family will not be able to take care of her at home.  Kathlene November, MD 05/02/2019 9.18 a.m.

## 2019-05-02 NOTE — ED Notes (Signed)
Pt refuse to eat dinner at this time

## 2019-05-02 NOTE — Evaluation (Signed)
Occupational Therapy Evaluation Patient Details Name: Samantha Mckenzie MRN: [id number] DOB: 04/15/1936 Today's Date: 05/02/2019    History of Present Illness Pt is an 83 y/o female presenting to the ED following increased neck pain and falls at home. CT of Cspine and head negative for acute abnormality. Pt with recent admission to [facility] following fall and weakness. PMH includes CKD, breast cancer, COPD, and DM.    Clinical Impression   PTA Pt was falling frequently at home, needing assist with ADL and IADL. Pt today is min A for transfers with hand held assist. She is able to perform figure 4 for LB ADL, but presents with neck pain limiting ambulation. Pt educated verbally in precautions (cervical) for pain management - log roll for bed mobility. Pt stating multiple times throughout session that she is ready to go to sleep and "let Jesus take me" suggest follow up with MOST form by MD and potentially palliative medicine with Breast cancer, new falling, and low appetite. Pt requires continued OT in the acute setting as well as afterwards at the SNF level to maximize safety and independence in ADL/IADL and for fall prevention.     Follow Up Recommendations  SNF;Supervision/Assistance - 24 hour    Equipment Recommendations  Other (comment)(defer to next venue of care)    Recommendations for Other Services       Precautions / Restrictions Precautions Precautions: Fall Precaution Comments: Pt reporting at least 4 falls within the past couple weeks.  Restrictions Weight Bearing Restrictions: No      Mobility Bed Mobility Overal bed mobility: Needs Assistance Bed Mobility: Rolling;Sidelying to Sit;Sit to Sidelying Rolling: Mod assist Sidelying to sit: Mod assist     Sit to sidelying: Mod assist General bed mobility comments: Mod A for assist with rolling to side and for LE assist and trunk elevation during bed mobility. Cues for log roll technique to help with pain management.    Transfers Overall transfer level: Needs assistance Equipment used: 1 person hand held assist Transfers: Sit to/from Stand Sit to Stand: Min assist;From elevated surface         General transfer comment: Min A for lift assist from elevated bed, min A for balance and boost    Balance Overall balance assessment: Needs assistance Sitting-balance support: No upper extremity supported;Feet supported Sitting balance-Leahy Scale: Fair     Standing balance support: Bilateral upper extremity supported;During functional activity Standing balance-Leahy Scale: Poor Standing balance comment: Reliant on at least one UE support                            ADL either performed or assessed with clinical judgement   ADL Overall ADL's : Needs assistance/impaired Eating/Feeding: Set up;Sitting Eating/Feeding Details (indicate cue type and reason): reports no appetite Grooming: Set up;Sitting Grooming Details (indicate cue type and reason): unable to maintain standing without UE support Upper Body Bathing: Moderate assistance;Sitting   Lower Body Bathing: Moderate assistance;Sit to/from stand   Upper Body Dressing : Minimal assistance;Sitting   Lower Body Dressing: Moderate assistance;Sit to/from stand Lower Body Dressing Details (indicate cue type and reason): able to perform figure 4 for L dressing but unable to perform sit<>stands Toilet Transfer: Minimal assistance Toilet Transfer Details (indicate cue type and reason): HHA Toileting- Clothing Manipulation and Hygiene: Moderate assistance       Functional mobility during ADLs: Minimal assistance(HHA) General ADL Comments: Pt requires increased time to engage in ADLs while seated for  safety.     Vision Patient Visual Report: No change from baseline       Perception     Praxis      Pertinent Vitals/Pain Pain Assessment: 0-10 Pain Score: 10-Worst pain ever Pain Location: neck and head with movement Pain Descriptors  / Indicators: Aching;Grimacing;Guarding Pain Intervention(s): Limited activity within patient's tolerance;Monitored during session;Repositioned     Hand Dominance Right   Extremity/Trunk Assessment Upper Extremity Assessment Upper Extremity Assessment: LUE deficits/detail LUE Deficits / Details: edema noted throughout, AROM WFL - states that edema is normal - she typically wears a sleeve for management from Breast Cancer   Lower Extremity Assessment Lower Extremity Assessment: Generalized weakness   Cervical / Trunk Assessment Cervical / Trunk Assessment: Other exceptions Cervical / Trunk Exceptions: Reports increased neck pain and stiffness    Communication Communication Communication: HOH   Cognition Arousal/Alertness: Awake/alert Behavior During Therapy: WFL for tasks assessed/performed Overall Cognitive Status: Impaired/Different from baseline Area of Impairment: Following commands;Safety/judgement;Problem solving                       Following Commands: Follows one step commands consistently;Follows one step commands with increased time Safety/Judgement: Decreased awareness of safety   Problem Solving: Slow processing;Difficulty sequencing General Comments: no family there to determine baseline   General Comments  Pt making comments about wanting to go to sleep and never wake up. Educated on MOST form as Pt is currently full code so that Pt's wishes are upheld for DNR/DNI status. When I asked if she woud want CPR she said "Oh no, and don't shock me with those paddle things either!"    Exercises     Shoulder Instructions      Home Living Family/patient expects to be discharged to:: Skilled nursing facility Living Arrangements: Spouse/significant other Available Help at Discharge: Family;Available PRN/intermittently Type of Home: House Home Access: Stairs to enter [employer] of Steps: 1 Entrance Stairs-Rails: None Home Layout: One level      Bathroom Shower/Tub: Occupational psychologist: Standard     Home Equipment: Environmental consultant - 2 wheels;Walker - 4 wheels          Prior Functioning/Environment Level of Independence: Needs assistance  Gait / Transfers Assistance Needed: Pt reports using RW for ambulation, however, has had multiple falls at home.  ADL's / Homemaking Assistance Needed: Pt reports she has been unable to perform ADL tasks at home independently secondary to pain in back and neck.             OT Problem List: Decreased activity tolerance;Decreased safety awareness;Decreased knowledge of use of DME or AE      OT Treatment/Interventions: Self-care/ADL training;Therapeutic exercise;Manual therapy;Patient/family education;Balance training;Therapeutic activities    OT Goals(Current goals can be found in the care plan section) Acute Rehab OT Goals Patient Stated Goal: "I miss walking around my neighborhood - I want to walk good again" OT Goal Formulation: With patient Time For Goal Achievement: 05/16/19 Potential to Achieve Goals: Good ADL Goals Pt Will Perform Grooming: with modified independence;standing Pt Will Perform Upper Body Dressing: with modified independence;sitting Pt Will Perform Lower Body Dressing: with modified independence;sit to/from stand Pt Will Transfer to Toilet: with modified independence;ambulating Pt Will Perform Toileting - Clothing Manipulation and hygiene: with modified independence;sit to/from stand Additional ADL Goal #1: Pt will demonstrate log roll techique for pain management with bed mobility prior to engaging in ADL at independent level  OT Frequency: Min 2X/week  Barriers to D/C: Decreased caregiver support  Pt husband is not able to physically assist       Co-evaluation              AM-PAC OT "6 Clicks" Daily Activity     Outcome Measure Help from another person eating meals?: None Help from another person taking care of personal grooming?: A  Little Help from another person toileting, which includes using toliet, bedpan, or urinal?: A Little Help from another person bathing (including washing, rinsing, drying)?: A Lot Help from another person to put on and taking off regular upper body clothing?: A Little Help from another person to put on and taking off regular lower body clothing?: A Lot 6 Click Score: 17   End of Session Equipment Utilized During Treatment: Gait belt Nurse Communication: Mobility status;Other (comment)(recommend contacting physician about MOST form)  Activity Tolerance: Patient limited by pain Patient left: in bed;with call bell/phone within reach  OT Visit Diagnosis: Unsteadiness on feet (R26.81);Muscle weakness (generalized) (M62.81);Adult, failure to thrive (R62.7);Repeated falls (R29.6);History of falling (Z91.81);Other symptoms and signs involving cognitive function;Pain Pain - part of body: (neck/head)                Time: 1884-1660 OT Time Calculation (min): 25 min Charges:  OT General Charges $OT Visit: 1 Visit OT Evaluation $OT Eval Moderate Complexity: 1 Mod OT Treatments $Self Care/Home Management : 8-22 mins  Hulda Humphrey OTR/L Acute Rehabilitation Services Pager: [phone] Office: [place] 05/02/2019, 4:31 PM

## 2019-05-02 NOTE — Progress Notes (Signed)
CSW received call from [facility] with [facility] requesting H&Ps and COVID results from June 9th to present for her staff to look at before they can move forward with extending a bed offer to patient. CSW to fax this information to [phone].  [place], LCSW Transitions of Care Department [facility] ED [phone]

## 2019-05-02 NOTE — Progress Notes (Addendum)
3pm: CSW spoke with Juliann Pulse at [facility] to obtain an update, Juliann Pulse reports that the patient's acceptance is pending. CSW spoke with Narda Rutherford at [facility] who stated that facility is not accepting new admissions due to [place]: CSW spoke with Lexine Baton at [facility] to obtain bed availability information, and unfortunately is not accepting any new admissions at this time.  CSW spoke with patient to inform her that her first choice is not available but she was accepted at [facility]. Patient agreeable to accept the bed offer.  CSW spoke with Juliann Pulse at [facility] who has accepted this patient. The room information is still pending at this time.  Madilyn Fireman, MSW, LCSW-A Clinical Social Worker Transitions of [place] Emergency Department [phone]

## 2019-05-02 NOTE — ED Notes (Signed)
Dinner order placed

## 2019-05-02 NOTE — ED Notes (Signed)
Call spouse [name]) at [phone]

## 2019-05-02 NOTE — ED Notes (Signed)
Diet was ordered for Lunch.

## 2019-05-02 NOTE — ED Notes (Signed)
Patient's son who is [employer] at bedside talking with  Patient regarding plan to go to SNF; Pt is anxious about going to SNF and ready to leave; Pt is A&Ox 4-Monique,RN

## 2019-05-02 NOTE — ED Notes (Signed)
Breakfast tray delivered

## 2019-05-03 ENCOUNTER — Telehealth: Payer: Self-pay | Admitting: Internal Medicine

## 2019-05-03 DIAGNOSIS — R197 Diarrhea, unspecified: Secondary | ICD-10-CM | POA: Diagnosis not present

## 2019-05-03 MED ORDER — HYDROCODONE-ACETAMINOPHEN 5-325 MG PO TABS
1.0000 | ORAL_TABLET | Freq: Once | ORAL | Status: AC
  Administered 2019-05-03: 1 via ORAL
  Filled 2019-05-03: qty 1

## 2019-05-03 MED ORDER — ONDANSETRON HCL 4 MG PO TABS
4.0000 mg | ORAL_TABLET | Freq: Once | ORAL | Status: AC
  Administered 2019-05-03: 4 mg via ORAL
  Filled 2019-05-03: qty 1

## 2019-05-03 NOTE — ED Notes (Signed)
Pt ambulated with assistance to BR

## 2019-05-03 NOTE — ED Notes (Signed)
ED Provider at bedside. 

## 2019-05-03 NOTE — ED Notes (Signed)
Ordered dinner tray.  

## 2019-05-03 NOTE — Progress Notes (Addendum)
3:15pm: CSW received call from [place] at [facility] who states the facility can accept this patient. Facility requesting patient not be transferred until tomorrow to prepare the room for her arrival. CSW will assist with discharge to facility tomorrow.  2:45pm: CSW attempted to reach Chantel at [facility] without success, left voicemail requesting a return call.   1pm: CSW received call from [facility] stating that the patient was not accepted at the facility due to her falling four times and the requirement of admission to the isolation unit with the doors locked. Patient is still under review at [facility].  10:30am: CSW spoke with patient via telephone to inform her of updates with placement. Patient agreeable to explore other options including [facility]. CSW spoke with [place] in admissions at [facility] to request a review of this patient. Patient's information is being reviewed by the administrator and Carylon Perches will return call to [place] with more information.  10am: CSW received call from [place] at [facility] who stated the facility cannot accept the patient. CSW reached out to staff at [facility] for review of this patient.  CSW updated Lowella Petties, Therapist, sports.  Madilyn Fireman, MSW, LCSW-A Clinical Social Worker Transitions of [place] Emergency Department [phone]

## 2019-05-03 NOTE — ED Notes (Signed)
Patient refused to eat and drink, the Nurse was informed.

## 2019-05-03 NOTE — Telephone Encounter (Signed)
General/Other - call back  The patient's husband called to report to Dr. Larose Kells about what was going on with his wife. Please call back

## 2019-05-03 NOTE — Care Management (Signed)
Late entry for 05/01/19.   ED CM spoke with the patient at the bedside. Patient states she lives at home with her husband who is unable to care for her. She states her son wants her to go to rehab before returning home. ED CM asked the patient about her experience at [facility] which resulted in her leaving the same day she was admitted. Patient states she was not pleased because she sat alone in the room for 2 hours. She is open to returning to [facility] if they will accept her. ED CM discussed patient with ED CSW. Patient was informed she will need to remain in the ED overnight for assistance with placement. Admissions at [facility] is not available at this time.  Patient asked if she can return home tonight and wait at home for placement the following day. We discussed her risk of falling if she returned home and risk of a possible injury. She is agreeable to staying to await placement.

## 2019-05-03 NOTE — ED Notes (Signed)
Patient is in room moaning and saying she is going to put her self on the floor; pt states her HA is not getting better; Patient given PRN medication and EDP notified to assess patient-Monique,RN

## 2019-05-04 DIAGNOSIS — E1122 Type 2 diabetes mellitus with diabetic chronic kidney disease: Secondary | ICD-10-CM | POA: Diagnosis not present

## 2019-05-04 DIAGNOSIS — I10 Essential (primary) hypertension: Secondary | ICD-10-CM | POA: Diagnosis not present

## 2019-05-04 DIAGNOSIS — R531 Weakness: Secondary | ICD-10-CM | POA: Diagnosis not present

## 2019-05-04 DIAGNOSIS — Z1159 Encounter for screening for other viral diseases: Secondary | ICD-10-CM | POA: Diagnosis not present

## 2019-05-04 DIAGNOSIS — D638 Anemia in other chronic diseases classified elsewhere: Secondary | ICD-10-CM | POA: Diagnosis not present

## 2019-05-04 DIAGNOSIS — J449 Chronic obstructive pulmonary disease, unspecified: Secondary | ICD-10-CM | POA: Diagnosis not present

## 2019-05-04 DIAGNOSIS — I959 Hypotension, unspecified: Secondary | ICD-10-CM | POA: Diagnosis not present

## 2019-05-04 DIAGNOSIS — K529 Noninfective gastroenteritis and colitis, unspecified: Secondary | ICD-10-CM | POA: Diagnosis not present

## 2019-05-04 DIAGNOSIS — E1140 Type 2 diabetes mellitus with diabetic neuropathy, unspecified: Secondary | ICD-10-CM | POA: Diagnosis not present

## 2019-05-04 DIAGNOSIS — K50814 Crohn's disease of both small and large intestine with abscess: Secondary | ICD-10-CM | POA: Diagnosis not present

## 2019-05-04 DIAGNOSIS — Z20828 Contact with and (suspected) exposure to other viral communicable diseases: Secondary | ICD-10-CM | POA: Diagnosis not present

## 2019-05-04 DIAGNOSIS — F339 Major depressive disorder, recurrent, unspecified: Secondary | ICD-10-CM | POA: Diagnosis not present

## 2019-05-04 DIAGNOSIS — Z03818 Encounter for observation for suspected exposure to other biological agents ruled out: Secondary | ICD-10-CM | POA: Diagnosis not present

## 2019-05-04 DIAGNOSIS — M542 Cervicalgia: Secondary | ICD-10-CM | POA: Diagnosis not present

## 2019-05-04 DIAGNOSIS — N184 Chronic kidney disease, stage 4 (severe): Secondary | ICD-10-CM | POA: Diagnosis not present

## 2019-05-04 DIAGNOSIS — R197 Diarrhea, unspecified: Secondary | ICD-10-CM | POA: Diagnosis not present

## 2019-05-04 DIAGNOSIS — Z743 Need for continuous supervision: Secondary | ICD-10-CM | POA: Diagnosis not present

## 2019-05-04 DIAGNOSIS — I5030 Unspecified diastolic (congestive) heart failure: Secondary | ICD-10-CM | POA: Diagnosis not present

## 2019-05-04 DIAGNOSIS — R269 Unspecified abnormalities of gait and mobility: Secondary | ICD-10-CM | POA: Diagnosis not present

## 2019-05-04 DIAGNOSIS — R279 Unspecified lack of coordination: Secondary | ICD-10-CM | POA: Diagnosis not present

## 2019-05-04 NOTE — ED Notes (Signed)
Report called to April at [facility]

## 2019-05-04 NOTE — TOC Transition Note (Signed)
Transition of Care [facility]) - CM/SW Discharge Note   Patient Details  Name: Samantha Mckenzie MRN: [id number] Date of Birth: 1936-01-29  Transition of Care [facility]) CM/SW Contact:  Archie Endo, LCSW Phone Number: 05/04/2019, 8:41 AM   Clinical Narrative:    CSW spoke with patient's son [name] to inform him of plan for SNF placement at discharge. CSW informed [name] of attempts that were made to place patient at her first choice facility of [facility] that was unsuccessful. [name] is agreeable for placement at [facility] and will strongly encourage his mother to stay there for rehab and not leave before is she ready.  CSW spoke with Chantel at [facility] to obtain room information. Patient will go to room 203. Patient will travel to [place] via [place] at [facility]. The number to call for report is [phone].    Final next level of care: Skilled Nursing Facility Barriers to Discharge: No Barriers Identified   Patient Goals and CMS Choice Patient states their goals for this hospitalization and ongoing recovery are:: Get stronger and go home CMS Medicare.gov Compare Post Acute Care list provided to:: Patient Choice offered to / list presented to : Patient  Discharge Placement              Patient chooses bed at: [facility] Patient to be transferred to facility by: [facility] Name of family member notified: [name], son Patient and family notified of of transfer: 05/04/19  Discharge Plan and Services   Discharge Planning Services: NA Post Acute Care Choice: [facility]                            Representative spoke with at [place]: N/A  Social Determinants of Health ([place]) Interventions     Readmission Risk Interventions Readmission Risk Prevention Plan 04/10/2019 03/26/2019  Transportation Screening Complete Complete  PCP or Specialist Appt within 3-5 Days - Complete  HRI or [place] - Complete  Social Work Consult for [place]  Planning/Counseling - Complete  Palliative Care Screening - Not Applicable  Medication Review Press photographer) Referral to Pharmacy Complete  PCP or Specialist appointment within 3-5 days of discharge Complete -  [place] or Home Care Consult Complete -  SW Recovery Care/Counseling Consult Complete -  Palliative Care Screening Not Applicable -  [place] Not Applicable -  Some recent data might be hidden

## 2019-05-04 NOTE — ED Provider Notes (Addendum)
Emergency department psychiatric rounding note  Patient has been in the emergency department for approximately 72 hours.  Review of notes shows that patient presented from with diarrhea and generalized weakness, left her skilled nursing facility [facility].  Reassuring work-up in the emergency department and she is awaiting placement at this time.  Result review: CBC with hemoglobin 9.3, improved from prior CMP with creatinine 2.19, improved from prior Urinalysis without signs of infection COVID-19 negative Magnesium 1.5, she was repleted IV prior, now eating and drinking here in the emergency department and with improved diarrhea should continue to correct. CT head and cervical spine: ADDENDUM: The coronal and sagittal recons of the head have been repeated and now include the whole head. No skull fracture, intracranial hemorrhage or other acute abnormality is seen.  EKG without acute finding - Vital signs remained stable most recent taken this morning at 6:53 AM, afebrile, pulse 82, blood pressure 133/52, respirations 16, SPO2 96% on room air. Physical Exam  BP (!) 133/52 (BP Location: Right Arm)   Pulse 82   Temp 98 F (36.7 C) (Oral)   Resp 16   Ht 5' 4"  (1.626 m)   Wt 59 kg   SpO2 96%   BMI 22.31 kg/m   Physical Exam Constitutional:      General: She is not in acute distress.    Appearance: Normal appearance. She is well-developed. She is not ill-appearing or diaphoretic.  HENT:     Head: Normocephalic and atraumatic.     Right Ear: External ear normal.     Left Ear: External ear normal.     Nose: Nose normal.  Eyes:     General: Vision grossly intact. Gaze aligned appropriately.  Neck:     Musculoskeletal: Normal range of motion.     Trachea: Trachea and phonation normal. No tracheal deviation.  Pulmonary:     Effort: Pulmonary effort is normal. No respiratory distress.  Musculoskeletal: Normal range of motion.  Skin:    General: Skin is warm and dry.   Neurological:     Mental Status: She is alert.     GCS: GCS eye subscore is 4. GCS verbal subscore is 5. GCS motor subscore is 6.     Comments: Speech is clear and goal oriented, follows commands Major Cranial nerves without deficit, no facial droop Moves extremities without ataxia, coordination intact  Psychiatric:        Mood and Affect: Mood normal.        Behavior: Behavior normal.    ED Course/Procedures   Clinical Course as of May 03 830  Tue May 01, 2019  1901 Nursing reached out to social work because PT consult was done already and is recommending inpatient.  Social work is going to contact family and find out if they are considering rehab versus her going home.   [MB]  1935 It sounds like the patient might be a border here tonight.  Social work was going to update patient and family.   [MB]    Clinical Course User Index [MB] Hayden Rasmussen, MD    Procedures  MDM  8:30 AM: Patient assessed in bed she is resting comfortably and in no acute distress.  She is talking on her cell phone with her husband.  She reports that she is feeling well and has no complaints at this time.  Breakfast at bedside.  She has no questions and states understanding of care plan.  Review of most recent Education officer, museum notes  from 9:57 AM 05/03/2019 shows that patient has been accepted to [facility] and plans to transfer this morning.  At this time there does not appear to be any evidence of an acute emergency medical condition and the patient appears stable.  Addendum 10 AM Informed by nursing staff that skilled nursing facility is not ready for the patient and that she needs to be placed for discharge.  AVS completed, patient to be transferred to skilled nursing facility.   Note: Portions of this report may have been transcribed using voice recognition software. Every effort was made to ensure accuracy; however, inadvertent computerized transcription errors may still be present.   Deliah Boston, PA-C 05/04/19 0841    Deliah Boston, PA-C 05/04/19 0843    Deliah Boston, PA-C 05/04/19 1002    Virgel Manifold, MD 05/05/19 1539

## 2019-05-04 NOTE — Discharge Instructions (Addendum)
At this time there does not appear to be the presence of an emergent medical condition, however there is always the potential for conditions to change. Please read and follow the below instructions.  Please return to the Emergency Department immediately for any new or worsening symptoms. Please be sure to follow up with your Primary Care Provider within one week regarding your visit today; please call their office to schedule an appointment even if you are feeling better for a follow-up visit.  Get help right away if: You have chest pain. You feel very weak or you pass out (faint). You have bloody or black poop or poop that looks like tar. You have very bad pain, cramping, or bloating in your belly (abdomen). You have trouble breathing or you are breathing very quickly. Your heart is beating very quickly. Your skin feels cold and clammy. You feel confused. You have signs of losing too much water in your body, such as: Dark pee, very little pee, or no pee. Cracked lips. Dry mouth. Sunken eyes. Sleepiness. Weakness. Any new/concerning or worsening symptoms  Please read the additional information packets attached to your discharge summary.

## 2019-05-04 NOTE — ED Notes (Signed)
Diet was ordered for Lunch, and Cup of water and Crackers was placed at bedside.

## 2019-05-04 NOTE — Telephone Encounter (Signed)
Spoke with the husband today is 05/04/2019, the patient is to be transferred to a SNF today.  He knows to call me if there is questions.

## 2019-05-07 ENCOUNTER — Other Ambulatory Visit: Payer: Self-pay

## 2019-05-07 DIAGNOSIS — N184 Chronic kidney disease, stage 4 (severe): Secondary | ICD-10-CM | POA: Diagnosis not present

## 2019-05-07 DIAGNOSIS — K529 Noninfective gastroenteritis and colitis, unspecified: Secondary | ICD-10-CM | POA: Diagnosis not present

## 2019-05-07 DIAGNOSIS — D638 Anemia in other chronic diseases classified elsewhere: Secondary | ICD-10-CM | POA: Diagnosis not present

## 2019-05-07 DIAGNOSIS — R269 Unspecified abnormalities of gait and mobility: Secondary | ICD-10-CM | POA: Diagnosis not present

## 2019-05-08 ENCOUNTER — Telehealth: Payer: Self-pay

## 2019-05-08 ENCOUNTER — Ambulatory Visit (INDEPENDENT_AMBULATORY_CARE_PROVIDER_SITE_OTHER): Payer: Medicare Other | Admitting: Internal Medicine

## 2019-05-08 ENCOUNTER — Telehealth: Payer: Self-pay | Admitting: *Deleted

## 2019-05-08 DIAGNOSIS — F339 Major depressive disorder, recurrent, unspecified: Secondary | ICD-10-CM

## 2019-05-08 DIAGNOSIS — R627 Adult failure to thrive: Secondary | ICD-10-CM

## 2019-05-08 DIAGNOSIS — K529 Noninfective gastroenteritis and colitis, unspecified: Secondary | ICD-10-CM

## 2019-05-08 NOTE — Assessment & Plan Note (Signed)
Failure to thrive: Since the last visit, the patient went to the ER and subsequently to SNF but against she checked herself out. He is currently at home with her husband. Has no new complaints. Recommend good medication compliance, will ask palliative care to visit her. Diarrhea: With black stools, antibiotic related?.  Recommend OTC probiotics Depression: Strongly recommend good compliance with escitalopram. I understand that she is overwhelmed by not feeling physically well but she is counseled and provided w/ emotional support and hope.

## 2019-05-08 NOTE — Progress Notes (Signed)
Subjective:    Patient ID: Samantha Mckenzie, female    DOB: 08-18-1936, 83 y.o.   MRN: [id number]  DOS:  05/08/2019 Type of visit - description: Attempted  to make this a video visit, due to technical difficulties from the patient side it was not possible  thus we proceeded with a Virtual Visit via Telephone    I connected with@   by telephone and verified that I am speaking with the correct person using two identifiers.  THIS ENCOUNTER IS A VIRTUAL VISIT DUE TO COVID-19 - PATIENT WAS NOT SEEN IN THE OFFICE. PATIENT HAS CONSENTED TO VIRTUAL VISIT / TELEMEDICINE VISIT   Location of patient: home  Location of provider: office  I discussed the limitations, risks, security and privacy concerns of performing an evaluation and management service by telephone and the availability of in person appointments. I also discussed with the patient that there may be a patient responsible charge related to this service. The patient expressed understanding and agreed to proceed.   History of Present Illness: Acute, visit requested by the patient  Patient was seen 04/25/2019 with failure to thrive. Subsequently went to the ER 05/01/2019, she was seen by the physicians, physical therapists as well as social workers, I note that she refused to eat or drink while she was there, patient needed assistance to transfer. Eventually, she was transferred to [facility] I believe on 05/04/2019  She left the SNF, states that she went there against her will and she does not think she needs those services.   Review of Systems Has essentially no new complaints: Continue with back pain, feels weak, no appetite, still has diarrhea.  Stools are reportedly black but that is going on for at least a couple of weeks. Denies fever chills No abdominal pain  Past Medical History:  Diagnosis Date  . Allergic rhinitis   . Anemia   . Breast CA ([facility])    surgery, chemo, XRT; had peripheral neuropathy (imbalance at times) after  chemo  . Cellulitis    Left arm, recurrent   . Cellulitis LEFT arm recurrent 12/08/2011  . Chronic renal disease, stage IV ([facility])    Dr. Moshe Cipro  . Depression   . Fatigue   . GERD (gastroesophageal reflux disease)    gastritis, EGD 02/2007  . Gout   . Hyperlipidemia   . Hypertension   . Macular degeneration 2011  . Normal cardiac stress test 12/2014  . Osteoarthritis   . Renal insufficiency     Past Surgical History:  Procedure Laterality Date  . cataracts bilaterally  1 -2012  . LUNG BIOPSY  1999   neg  . MASTECTOMY Left 1999   and lymphnodes     Social History   Socioeconomic History  . Marital status: Married    Spouse name: Not on file  . Number of children: 2  . Years of education: Not on file  . Highest education level: Not on file  Occupational History  . Occupation: retired     Fish farm manager: RETIRED  Social Needs  . Financial resource strain: Not on file  . Food insecurity    Worry: Not on file    Inability: Not on file  . Transportation needs    Medical: Not on file    Non-medical: Not on file  Tobacco Use  . Smoking status: Never Smoker  . Smokeless tobacco: Never Used  Substance and Sexual Activity  . Alcohol use: Not Currently    Alcohol/week: 0.0 standard drinks  Comment: rarely  . Drug use: No  . Sexual activity: Not Currently  Lifestyle  . Physical activity    Days per week: Not on file    Minutes per session: Not on file  . Stress: Not on file  Relationships  . Social Herbalist on phone: Not on file    Gets together: Not on file    Attends religious service: Not on file    Active member of club or organization: Not on file    Attends meetings of clubs or organizations: Not on file    Relationship status: Not on file  . Intimate partner violence    Fear of current or ex partner: Not on file    Emotionally abused: Not on file    Physically abused: Not on file    Forced sexual activity: Not on file  Other Topics Concern   . Not on file  Social History Narrative   Lives w/ husband , 2 children in [place], 2 [place] as of 05/08/2019      Reactions   Codeine Nausea And Vomiting   Morphine Nausea And Vomiting   Oxycodone-acetaminophen Nausea And Vomiting   Sulfonamide Derivatives Nausea And Vomiting   Tramadol Hcl Nausea And Vomiting      Medication List       Accurate as of May 08, 2019 10:54 AM. If you have any questions, ask your nurse or doctor.        acetaminophen 325 MG tablet Commonly known as: TYLENOL Take 2 tablets (650 mg total) by mouth every 6 (six) hours as needed for mild pain, fever or headache (or Fever >/= 101).   albuterol (2.5 MG/3ML) 0.083% nebulizer solution Commonly known as: PROVENTIL Take 3 mLs (2.5 mg total) by nebulization every 4 (four) hours as needed for wheezing or shortness of breath.   allopurinol 100 MG tablet Commonly known as: ZYLOPRIM Take 1 tablet (100 mg total) by mouth daily.   amLODipine 10 MG tablet Commonly known as: NORVASC Take 1 tablet (10 mg total) by mouth daily.   aspirin 81 MG tablet Take 1 tablet (81 mg total) by mouth daily with breakfast.   atorvastatin 20 MG tablet Commonly known as: LIPITOR Take 1 tablet (20 mg total) by mouth daily.   CALTRATE 600+D3 PO Take 1 tablet by mouth daily.   CENTRUM SILVER PO Take 1 tablet by mouth daily.   dicyclomine 10 MG capsule Commonly known as: BENTYL Take 2 capsules (20 mg total) by mouth 4 (four) times daily -  before meals and at bedtime.   escitalopram 5 MG tablet Commonly known as: Lexapro 1 tablet daily for 1 week, then 2 tablets daily What changed:   how much to take  how to take this  when to take this  additional instructions   hydrOXYzine 10 MG tablet Commonly known as: ATARAX/VISTARIL Take 1 tablet (10 mg total) by mouth 3 (three) times daily as needed for nausea.   loperamide 2 MG tablet Commonly known as: IMODIUM A-D Take 2 mg by mouth 4 (four)  times daily as needed for diarrhea or loose stools.   metoprolol tartrate 25 MG tablet Commonly known as: LOPRESSOR Take 1 tablet (25 mg total) by mouth 2 (two) times daily.   ondansetron 4 MG disintegrating tablet Commonly known as: ZOFRAN-ODT Take 1 tablet (4 mg total) by mouth every 6 (six) hours as needed  for nausea or vomiting.   pantoprazole 40 MG tablet Commonly known as: PROTONIX Take 40 mg by mouth daily.   vitamin B-12 1000 MCG tablet Commonly known as: CYANOCOBALAMIN Take 1,000 mcg by mouth daily.   vitamin C 500 MG tablet Commonly known as: ASCORBIC ACID Take 500 mg by mouth daily.   Vitamin D3 25 MCG (1000 UT) Caps Take 1 capsule by mouth daily.           Objective:   Physical Exam There were no vitals taken for this visit. This is a virtual phone visit.  The patient sounds actually a little stronger today, alert oriented x3.  No apparent distress.     Assessment      Assessment  DM- neuropathy (paresthesias, nl pinprick exam), CKD HTN Hyperlipidemia: Lipitor intolerant? CAD? States see Dr Terrence Dupont, no OV  records. (-) stress test 12-2014 CKD -- sees nephrology Gout GERD, nl EGD 01-2015  H/o Depression Leg pain-- RLS vs neuropathy, on gabapentin since 2013, good results H/o anemia --- no iron def , Cscope 2012, normal EGD 01-2015 Breast cancer:  --S/p Surgery (L mastectomy), chemotherapy, XRT --Peripheral neuropathy felt to be due to chemotherapy --Recurrent left arm cellulitis DJD  PLAN Failure to thrive: Since the last visit, the patient went to the ER and subsequently to SNF but against she checked herself out. He is currently at home with her husband. Has no new complaints. Recommend good medication compliance, will ask palliative care to visit her. Diarrhea: With black stools, antibiotic related?.  Recommend OTC probiotics Depression: Strongly recommend good compliance with escitalopram. I understand that she is overwhelmed by not feeling  physically well but she is counseled and provided w/ emotional support and hope.    I discussed the assessment and treatment plan with the patient. The patient was provided an opportunity to ask questions and all were answered. The patient agreed with the plan and demonstrated an understanding of the instructions.   The patient was advised to call back or seek an in-person evaluation if the symptoms worsen or if the condition fails to improve as anticipated.  I provided 22 minutes of non-face-to-face time during this encounter.  Kathlene November, MD

## 2019-05-08 NOTE — Telephone Encounter (Signed)
Noted patient was no longer at SNF. Reviewed documentation from PCP and desire for Palliative Care to follow up. Primary palliative team updated

## 2019-05-08 NOTE — Telephone Encounter (Signed)
Received another Palliative Care Referral today for this patient from her PCP. She was recently admitted to a SNF, but decided to sign herself out so is now back at home with her husband. Contacted and spoke with both patient and husband to arrange a home visit. Visit scheduled for tomorrow 05/08/19 at 9:00am.

## 2019-05-09 ENCOUNTER — Other Ambulatory Visit: Payer: Medicare Other | Admitting: *Deleted

## 2019-05-09 ENCOUNTER — Other Ambulatory Visit: Payer: Medicare Other | Admitting: Licensed Clinical Social Worker

## 2019-05-09 ENCOUNTER — Other Ambulatory Visit: Payer: Self-pay

## 2019-05-09 DIAGNOSIS — Z515 Encounter for palliative care: Secondary | ICD-10-CM

## 2019-05-09 NOTE — Progress Notes (Signed)
COMMUNITY PALLIATIVE CARE RN NOTE  PATIENT NAME: Samantha Mckenzie DOB: 12-Aug-1936 MRN: [id number]  PRIMARY CARE PROVIDER: Colon Branch, MD  RESPONSIBLE PARTY:  Acct ID - Guarantor Home Phone Work Phone Relationship Acct Type  [id number] EVVA, [id number]  Self P/F     [address], [place], [place] [address]   Covid-19 Pre-screening Negative  PLAN OF CARE and INTERVENTION:  1. ADVANCE CARE PLANNING/GOALS OF CARE: Goal is for patient to remain in her home. She requested to be a DNR. SW left 2 signed copies of form in the home. 2. PATIENT/CAREGIVER EDUCATION: Explained Palliative Services, Safe Mobility, Pain Management 3. DISEASE STATUS: Joint visit made with Palliative Care NP, Jasmine Awe. Met with patient and her husband in their home. Patient c/o persistent headache in her posterior head radiating down underneath her shoulder blades. She says she had a fall about a month ago where she hit the back of her head, requiring stitches, and ever since then she has been experiencing pain. Pain mainly occurs when sitting upright or up moving around. She states her head feels that it pulls her forward, causing her to walk in a bent over position. Pain is relieved when she rests her head back on her chair (which she did throughout most of visit) or lying down in bed. Tylenol 650 mg helps some for about 3 hours, but then symptoms return. She has an intolerance to multiple opioids, which causes nausea/vomiting. She was recently admitted to a SNF facility for Rehab, but signed herself out within 3 days stating that she does not want to stay there. She wants to stay in her home. She reports increasing weakness/fatigue. Her appetite is poor. She would like to try an appetite stimulant to see if this would help. She does not like Ensure or any nutritional supplements. She vomited last night after eating a small portion of a philly steak and cheese sandwich. Recommended that she try lighter foods. She  has Zofran in the home, but has not been taking. She was able to locate this medication in the home and I was able to provide education on when to utilize this. She has diarrhea at times, Imodium effective. Stools are black at times. She is now taking a Probiotic. She used to like listening to music, play on her tablet and watch TV, but over the past 7 weeks since she has not been feeling well, has lost all interest in these things. She does admit to most likely being depressed and even mentioned that at times she lies in bed and cries asking Jesus to take her. She denies any suicidal ideations. She has not been taking her Lexapro. She has not been able to locate this medication in her home since her last hospitalization. She does remember starting to take this, but hasn't since she doesn't know where it is. She is ambulatory without assistive devices in her home, but she does have a walker available. She is able to perform all ADLs independently, but has to take frequent breaks d/t weakness and head pain. She is continent of both bowel and bladder. SW to visit patient again next week to provide counseling. Will reach out to PCP regarding head pain, starting appetite stimulant and re-ordering Lexapro. Will continue to monitor.    HISTORY OF PRESENT ILLNESS:  This is a 83 yo female who resides at home with her husband. She was recently in the hospital with c/o neck pain, diarrhea and generalized weakness on 05/01/19. She was  transferred to a SNF, but signed out AMA. Palliative Care Team asked to follow patient for additional support in the home. RN or SW to visit patient at least bi-weekly and PRN.  CODE STATUS: DNR ADVANCED DIRECTIVES: Y MOST FORM: no PPS: 50%   PHYSICAL EXAM:   VITALS: Today's Vitals   05/09/19 0952  BP: (!) 117/59  Pulse: 81  Resp: 16  Temp: 97.6 F (36.4 C)  TempSrc: Temporal  SpO2: 97%  PainSc: 4   PainLoc: Head    LUNGS: clear to auscultation  CARDIAC: Cor  RRR EXTREMITIES: No edema SKIN: Poor skin turgor, exposed skin is dry and intact  NEURO: Alert and oriented x 4, generalized weakness, ambulatory   (Duration of visit and documentation 120  minutes)    Daryl Eastern, RN BSN

## 2019-05-09 NOTE — Progress Notes (Signed)
COMMUNITY PALLIATIVE CARE SW NOTE  PATIENT NAME: Samantha Mckenzie DOB: 1935-10-22 MRN: [id number]  PRIMARY CARE PROVIDER: Colon Branch, MD  RESPONSIBLE PARTY:  Acct ID - Guarantor Home Phone Work Phone Relationship Acct Type  [id number] HADAR, [id number]  Self P/F     [address], [place], [place] [address]     PLAN OF CARE and INTERVENTIONS:             1. GOALS OF CARE/ ADVANCE CARE PLANNING:  Patient wants to be pain free.  Patient is a DNR. 2. SOCIAL/EMOTIONAL/SPIRITUAL ASSESSMENT/ INTERVENTIONS:  SW and Palliative Care RN, Samantha Mckenzie, met with patient and her husband, Samantha Mckenzie, in their home.  Patient was born in [place] and met her husband in [place], [place].  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  They have lived in [place] for about 20 years and have two sons.  One is a retired Higher education careers adviser and the other is a IT trainer.  They have several grandchildren.  Patient complained of not feeling good.  She ate part of a philly cheesesteak last night and vomited.  She reports feeling week and the back of her head hurts if she is not lying down.  Patient had a fall about a month ago resulting in suters to the back of her head.  She complained of having no appetite.  Patient feels she is depressed due to her circumstances.  She denied any thoughts of hurting herself, but asks why she is still here.  Her husband appeared attentive, but there was some tension during the meeting.  Patient said she wished to be a DNR, which was provided.  Plan is for SW to meet with patient on 8/5, at 10am, to provide counseling. 3. PATIENT/CAREGIVER EDUCATION/ COPING:  Patient and her husband express their feelings openly. 4. PERSONAL EMERGENCY PLAN:  Family will contact EMS.  Patient will rest when she is fatigued. 5. COMMUNITY RESOURCES COORDINATION/ HEALTH CARE NAVIGATION:  None. 6. FINANCIAL/LEGAL CONCERNS/INTERVENTIONS:  None.     SOCIAL HX:  Social History   Tobacco Use  .  Smoking status: Never Smoker  . Smokeless tobacco: Never Used  Substance Use Topics  . Alcohol use: Not Currently    Alcohol/week: 0.0 standard drinks    Comment: rarely    CODE STATUS:  DNR  ADVANCED DIRECTIVES: N MOST FORM COMPLETE:  N HOSPICE EDUCATION PROVIDED: Y PPS:  Patient's appetite has decreased.  Patient walks independently in her home and has a walker. Duration of visit and documentation:  105 minutes.      Samantha Corn Morgin Halls, LCSW

## 2019-05-10 ENCOUNTER — Telehealth: Payer: Self-pay | Admitting: Internal Medicine

## 2019-05-10 ENCOUNTER — Telehealth: Payer: Self-pay | Admitting: *Deleted

## 2019-05-10 MED ORDER — DRONABINOL 2.5 MG PO CAPS: 2.5000 mg | ORAL_CAPSULE | Freq: Two times a day (BID) | ORAL | 3 refills | Status: DC

## 2019-05-10 MED ORDER — DICYCLOMINE HCL 10 MG PO CAPS: 20.0000 mg | ORAL_CAPSULE | Freq: Three times a day (TID) | ORAL | 3 refills | Status: DC

## 2019-05-10 MED ORDER — PANTOPRAZOLE SODIUM 40 MG PO TBEC: 40.0000 mg | DELAYED_RELEASE_TABLET | Freq: Every day | ORAL | 3 refills | Status: DC

## 2019-05-10 MED ORDER — ESCITALOPRAM OXALATE 10 MG PO TABS: 10.0000 mg | ORAL_TABLET | Freq: Every day | ORAL | 3 refills | Status: DC

## 2019-05-10 NOTE — Telephone Encounter (Signed)
Monishia -For appetite, I recommend Marinol 2.5 mg twice daily before meals.  If you need prescription let me know. - Definitely needs to take Lexapro regularly and if she needs another prescription I will send it -Headache: We will have to continue with Tylenol 654 times a day as needed and a heating pad (noting that she had a nonacute CT of the cervical spine the head on 05/01/2019). - She is supposed to take Protonix daily, Bentyl as needed only

## 2019-05-10 NOTE — Telephone Encounter (Signed)
Rxs sent

## 2019-05-10 NOTE — Telephone Encounter (Signed)
Contacted and spoke with patient to advise that I received a communication  from Dr. Larose Kells that he has sent a script for Marinol to her pharmacy to help with her appetite. And also that the [facility] called in a refill on Lexapro, Protonix and Bentyl since she was unable to locate these medications in the home. I also reinforced Tylenol 650 mg for her headaches. Patient appreciative of phone call and interventions.

## 2019-05-10 NOTE — Telephone Encounter (Signed)
-----   Message from Conan Bowens, RN sent at 05/09/2019  3:25 PM EDT ----- Regarding: Medication Questions Good afternoon, The Palliative SW and I were able to visit with her today. See my visit note from today for additional information. She is interested in trying an appetite stimulant to see if this will help her eat better to be able to improve her strength. She has not been taking her Lexapro. She says she is not sure where this medication is since her last hospitalization. She did start it, but unsure as to how long she has not been taking it. She c/o of a bad posterior headache since she fell hitting her head about a month ago. Tylenol 650 mg relieves this for about 3 hours but that's all. I see that she has intolerances to multiple opioids so she wants to know if there is anything else she can take to relieve the pain. She feels it is very debilitating. She has to lie down often because sitting up or walking around causes increased head pain. Also in reviewing her medications, I wasn't sure if she is supposed to be taking the Protonix and Bentyl, as these medications were also not located in the home. Please advise.  Thanks so much  Daryl Eastern RN, BSN Palliative Care [phone] cell)

## 2019-05-10 NOTE — Telephone Encounter (Signed)
Marinol sent. Kaylyn, please refill Lexapro 10 mg qd , Protonix and Bentyl x3 months.

## 2019-05-10 NOTE — Telephone Encounter (Signed)
Dr. Larose Kells  I will need you to send a prescription to her pharmacy for the Marinol. She uses the [facility] on [address]. Also could your nurse call in a refill for Lexapro, Protonix and Bentyl because she could not find these medications in the home either. I will let her know about the Tylenol and reiterate what the CT scan showed. Our SW is going to visit her again next week for counseling. And she wanted me to let you know that she appreciates all that you are doing for her and for sending her help. Thanks.   Monishia

## 2019-05-11 ENCOUNTER — Other Ambulatory Visit: Payer: Self-pay

## 2019-05-14 ENCOUNTER — Ambulatory Visit: Payer: Medicare Other | Admitting: Internal Medicine

## 2019-05-14 ENCOUNTER — Ambulatory Visit (INDEPENDENT_AMBULATORY_CARE_PROVIDER_SITE_OTHER): Payer: Medicare Other | Admitting: Internal Medicine

## 2019-05-14 ENCOUNTER — Telehealth: Payer: Self-pay | Admitting: Internal Medicine

## 2019-05-14 ENCOUNTER — Other Ambulatory Visit: Payer: Self-pay

## 2019-05-14 DIAGNOSIS — R627 Adult failure to thrive: Secondary | ICD-10-CM

## 2019-05-14 NOTE — Progress Notes (Addendum)
Subjective:    Patient ID: Samantha Mckenzie, female    DOB: Jan 29, 1936, 83 y.o.   MRN: [id number]  DOS:  05/14/2019 Type of visit - description: Attempted  to make this a video visit, due to technical difficulties from the patient side it was not possible  thus we proceeded with a Virtual Visit via Telephone    I connected with@   by telephone and verified that I am speaking with the correct person using two identifiers.  THIS ENCOUNTER IS A VIRTUAL VISIT DUE TO COVID-19 - PATIENT WAS NOT SEEN IN THE OFFICE. PATIENT HAS CONSENTED TO VIRTUAL VISIT / TELEMEDICINE VISIT   Location of patient: home  Location of provider: office  I discussed the limitations, risks, security and privacy concerns of performing an evaluation and management service by telephone and the availability of in person appointments. I also discussed with the patient that there may be a patient responsible charge related to this service. The patient expressed understanding and agreed to proceed.   History of Present Illness: Acute visit The patient's son liked it to discuss his mother's health in person but the patient on me felt that a phone conversation was appropriate because she is very weak.  The patient reports essentially no new symptoms. She continues to feel weak. She had 2 falls, apparently with no major consequences, no LOC or head injury Has noted her hands are slightly shaky. Her left arm is hard to move, she has a history of chronic edema at the left arm since breast cancer surgery.  Denies fever, chills, the arm is not more swollen than usual with no redness or warmness.    Review of Systems Again no fever chills No nausea or vomiting. Reports that has diarrhea but is slowing down Appetite has improved with Marinol  Past Medical History:  Diagnosis Date  . Allergic rhinitis   . Anemia   . Breast CA ([place])    surgery, chemo, XRT; had peripheral neuropathy (imbalance at times) after chemo  .  Cellulitis    Left arm, recurrent   . Cellulitis LEFT arm recurrent 12/08/2011  . Chronic renal disease, stage IV ([facility])    Dr. Moshe Cipro  . Depression   . Fatigue   . GERD (gastroesophageal reflux disease)    gastritis, EGD 02/2007  . Gout   . Hyperlipidemia   . Hypertension   . Macular degeneration 2011  . Normal cardiac stress test 12/2014  . Osteoarthritis   . Renal insufficiency     Past Surgical History:  Procedure Laterality Date  . cataracts bilaterally  1 -2012  . LUNG BIOPSY  1999   neg  . MASTECTOMY Left 1999   and lymphnodes     Social History   Socioeconomic History  . Marital status: Married    Spouse name: Not on file  . Number of children: 2  . Years of education: Not on file  . Highest education level: Not on file  Occupational History  . Occupation: retired     Fish farm manager: RETIRED  Social Needs  . Financial resource strain: Not on file  . Food insecurity    Worry: Not on file    Inability: Not on file  . Transportation needs    Medical: Not on file    Non-medical: Not on file  Tobacco Use  . Smoking status: Never Smoker  . Smokeless tobacco: Never Used  Substance and Sexual Activity  . Alcohol use: Not Currently    Alcohol/week:  0.0 standard drinks    Comment: rarely  . Drug use: No  . Sexual activity: Not Currently  Lifestyle  . Physical activity    Days per week: Not on file    Minutes per session: Not on file  . Stress: Not on file  Relationships  . Social Herbalist on phone: Not on file    Gets together: Not on file    Attends religious service: Not on file    Active member of club or organization: Not on file    Attends meetings of clubs or organizations: Not on file    Relationship status: Not on file  . Intimate partner violence    Fear of current or ex partner: Not on file    Emotionally abused: Not on file    Physically abused: Not on file    Forced sexual activity: Not on file  Other Topics Concern  . Not on  file  Social History Narrative   Lives w/ husband , 2 children in [place], 2 [place] as of 05/14/2019      Reactions   Codeine Nausea And Vomiting   Morphine Nausea And Vomiting   Oxycodone-acetaminophen Nausea And Vomiting   Sulfonamide Derivatives Nausea And Vomiting   Tramadol Hcl Nausea And Vomiting      Medication List       Accurate as of May 14, 2019 11:59 PM. If you have any questions, ask your nurse or doctor.        acetaminophen 325 MG tablet Commonly known as: TYLENOL Take 2 tablets (650 mg total) by mouth every 6 (six) hours as needed for mild pain, fever or headache (or Fever >/= 101).   albuterol (2.5 MG/3ML) 0.083% nebulizer solution Commonly known as: PROVENTIL Take 3 mLs (2.5 mg total) by nebulization every 4 (four) hours as needed for wheezing or shortness of breath.   allopurinol 100 MG tablet Commonly known as: ZYLOPRIM Take 1 tablet (100 mg total) by mouth daily.   amLODipine 10 MG tablet Commonly known as: NORVASC Take 1 tablet (10 mg total) by mouth daily.   aspirin 81 MG tablet Take 1 tablet (81 mg total) by mouth daily with breakfast.   atorvastatin 20 MG tablet Commonly known as: LIPITOR Take 1 tablet (20 mg total) by mouth daily.   CALTRATE 600+D3 PO Take 1 tablet by mouth daily.   CENTRUM SILVER PO Take 1 tablet by mouth daily.   dicyclomine 10 MG capsule Commonly known as: BENTYL Take 2 capsules (20 mg total) by mouth 4 (four) times daily -  before meals and at bedtime.   dronabinol 2.5 MG capsule Commonly known as: Marinol Take 1 capsule (2.5 mg total) by mouth 2 (two) times daily before a meal.   escitalopram 10 MG tablet Commonly known as: Lexapro Take 1 tablet (10 mg total) by mouth daily.   hydrOXYzine 10 MG tablet Commonly known as: ATARAX/VISTARIL Take 1 tablet (10 mg total) by mouth 3 (three) times daily as needed for nausea.   loperamide 2 MG tablet Commonly known as: IMODIUM A-D Take 2 mg by  mouth 4 (four) times daily as needed for diarrhea or loose stools.   metoprolol tartrate 25 MG tablet Commonly known as: LOPRESSOR Take 1 tablet (25 mg total) by mouth 2 (two) times daily.   ondansetron 4 MG disintegrating tablet Commonly known as: ZOFRAN-ODT Take 1 tablet (4 mg  total) by mouth every 6 (six) hours as needed for nausea or vomiting.   pantoprazole 40 MG tablet Commonly known as: PROTONIX Take 1 tablet (40 mg total) by mouth daily before breakfast.   vitamin B-12 1000 MCG tablet Commonly known as: CYANOCOBALAMIN Take 1,000 mcg by mouth daily.   vitamin C 500 MG tablet Commonly known as: ASCORBIC ACID Take 500 mg by mouth daily.   Vitamin D3 25 MCG (1000 UT) Caps Take 1 capsule by mouth daily.           Objective:   Physical Exam There were no vitals taken for this visit. This is a phone virtual visit, she is alert oriented x3, she sounded a little stronger.    Assessment       Assessment  DM- neuropathy (paresthesias, nl pinprick exam), CKD HTN Hyperlipidemia: Lipitor intolerant? CAD? States see Dr Terrence Dupont, no OV  records. (-) stress test 12-2014 CKD -- sees nephrology Gout GERD, nl EGD 01-2015  H/o Depression Leg pain-- RLS vs neuropathy, on gabapentin since 2013, good results H/o anemia --- no iron def , Cscope 2012, normal EGD 01-2015 Breast cancer:  --S/p Surgery (L mastectomy), chemotherapy, XRT --Peripheral neuropathy felt to be due to chemotherapy --Recurrent left arm cellulitis DJD  PLAN Failure to thrive: The patient is currently at home, she is stated "my son is determined to put me in nursing home".  I advised patient that I agree with her son, the best place for her to be is in a place that she can get more support. Nevertheless I told her I would talk with her son if she  grants me permission and she did. Anorexia has improved with the use of Marinol Recent fall: Strongly encouraged to use a walker. Left arm is hard to move, she is  prone to cellulitis due to lymphedema in the arm however she denies running fever chills, the arm is not red or warm.  Recommend observation for now Hand shakiness, unclear etiology.  Recommend observation Palliative care following her, next visit according to the patient is tomorrow (05/15/2019). Addendum: I spoke with the patient's son 05/15/2019, was a brief conversation, I explained that the situation but then the telephone communication ended  and I was not able to contact him again.      I discussed the assessment and treatment plan with the patient. The patient was provided an opportunity to ask questions and all were answered. The patient agreed with the plan and demonstrated an understanding of the instructions.   The patient was advised to call back or seek an in-person evaluation if the symptoms worsen or if the condition fails to improve as anticipated.  I provided 22 minutes of non-face-to-face time during this encounter.  Kathlene November, MD

## 2019-05-14 NOTE — Telephone Encounter (Signed)
Spoke w/ [name]-informed that PCP prefers telephone visit- [name] verbalized understanding.

## 2019-05-14 NOTE — Telephone Encounter (Signed)
[name], pt son, called wanting to have in person visit today with Dr. Larose Kells. He is concerned about 2 months of his mother's weakness, not wanting to eat, and signing out of facility twice AMA. He is aware of steps that have been taken but he is wanting to bring her for face to face eval today. Please call him [phone]

## 2019-05-14 NOTE — Telephone Encounter (Signed)
Son on [place].

## 2019-05-16 ENCOUNTER — Emergency Department (HOSPITAL_COMMUNITY): Payer: Medicare Other

## 2019-05-16 ENCOUNTER — Other Ambulatory Visit: Payer: Medicare Other | Admitting: Licensed Clinical Social Worker

## 2019-05-16 ENCOUNTER — Other Ambulatory Visit: Payer: Self-pay

## 2019-05-16 ENCOUNTER — Emergency Department (HOSPITAL_COMMUNITY)
Admission: EM | Admit: 2019-05-16 | Discharge: 2019-05-16 | Disposition: A | Discharge status: Home / Self Care | Payer: Medicare Other | Attending: Emergency Medicine | Admitting: Emergency Medicine

## 2019-05-16 ENCOUNTER — Ambulatory Visit: Payer: Self-pay

## 2019-05-16 ENCOUNTER — Encounter (HOSPITAL_COMMUNITY): Payer: Self-pay | Admitting: Emergency Medicine

## 2019-05-16 ENCOUNTER — Telehealth: Payer: Self-pay

## 2019-05-16 DIAGNOSIS — F0390 Unspecified dementia without behavioral disturbance: Secondary | ICD-10-CM | POA: Diagnosis not present

## 2019-05-16 DIAGNOSIS — R5381 Other malaise: Secondary | ICD-10-CM | POA: Diagnosis not present

## 2019-05-16 DIAGNOSIS — N184 Chronic kidney disease, stage 4 (severe): Secondary | ICD-10-CM | POA: Diagnosis not present

## 2019-05-16 DIAGNOSIS — I130 Hypertensive heart and chronic kidney disease with heart failure and stage 1 through stage 4 chronic kidney disease, or unspecified chronic kidney disease: Secondary | ICD-10-CM | POA: Diagnosis not present

## 2019-05-16 DIAGNOSIS — J449 Chronic obstructive pulmonary disease, unspecified: Secondary | ICD-10-CM | POA: Insufficient documentation

## 2019-05-16 DIAGNOSIS — E1140 Type 2 diabetes mellitus with diabetic neuropathy, unspecified: Secondary | ICD-10-CM | POA: Insufficient documentation

## 2019-05-16 DIAGNOSIS — W19XXXA Unspecified fall, initial encounter: Secondary | ICD-10-CM | POA: Diagnosis not present

## 2019-05-16 DIAGNOSIS — R404 Transient alteration of awareness: Secondary | ICD-10-CM | POA: Insufficient documentation

## 2019-05-16 DIAGNOSIS — R410 Disorientation, unspecified: Secondary | ICD-10-CM | POA: Diagnosis present

## 2019-05-16 DIAGNOSIS — J9811 Atelectasis: Secondary | ICD-10-CM | POA: Diagnosis not present

## 2019-05-16 DIAGNOSIS — Z79899 Other long term (current) drug therapy: Secondary | ICD-10-CM | POA: Insufficient documentation

## 2019-05-16 DIAGNOSIS — E1122 Type 2 diabetes mellitus with diabetic chronic kidney disease: Secondary | ICD-10-CM | POA: Insufficient documentation

## 2019-05-16 DIAGNOSIS — I10 Essential (primary) hypertension: Secondary | ICD-10-CM | POA: Diagnosis not present

## 2019-05-16 DIAGNOSIS — I5032 Chronic diastolic (congestive) heart failure: Secondary | ICD-10-CM | POA: Insufficient documentation

## 2019-05-16 DIAGNOSIS — Z515 Encounter for palliative care: Secondary | ICD-10-CM

## 2019-05-16 DIAGNOSIS — Z7982 Long term (current) use of aspirin: Secondary | ICD-10-CM | POA: Insufficient documentation

## 2019-05-16 DIAGNOSIS — R001 Bradycardia, unspecified: Secondary | ICD-10-CM | POA: Diagnosis not present

## 2019-05-16 DIAGNOSIS — R296 Repeated falls: Secondary | ICD-10-CM | POA: Diagnosis not present

## 2019-05-16 DIAGNOSIS — I959 Hypotension, unspecified: Secondary | ICD-10-CM | POA: Diagnosis not present

## 2019-05-16 DIAGNOSIS — R0902 Hypoxemia: Secondary | ICD-10-CM | POA: Diagnosis not present

## 2019-05-16 LAB — URINALYSIS, ROUTINE W REFLEX MICROSCOPIC
Bilirubin Urine: NEGATIVE
Glucose, UA: NEGATIVE mg/dL
Hgb urine dipstick: NEGATIVE
Ketones, ur: NEGATIVE mg/dL
Leukocytes,Ua: NEGATIVE
Nitrite: NEGATIVE
Protein, ur: NEGATIVE mg/dL
Specific Gravity, Urine: 1.004 — ABNORMAL LOW (ref 1.005–1.030)
pH: 5 (ref 5.0–8.0)

## 2019-05-16 LAB — COMPREHENSIVE METABOLIC PANEL
ALT: 14 U/L (ref 0–44)
AST: 17 U/L (ref 15–41)
Albumin: 2.1 g/dL — ABNORMAL LOW (ref 3.5–5.0)
Alkaline Phosphatase: 69 U/L (ref 38–126)
Anion gap: 12 (ref 5–15)
BUN: 33 mg/dL — ABNORMAL HIGH (ref 8–23)
CO2: 24 mmol/L (ref 22–32)
Calcium: 8.4 mg/dL — ABNORMAL LOW (ref 8.9–10.3)
Chloride: 92 mmol/L — ABNORMAL LOW (ref 98–111)
Creatinine, Ser: 2.49 mg/dL — ABNORMAL HIGH (ref 0.44–1.00)
GFR calc Af Amer: 20 mL/min — ABNORMAL LOW (ref >60)
GFR calc non Af Amer: 18 mL/min — ABNORMAL LOW (ref >60)
Glucose, Bld: 173 mg/dL — ABNORMAL HIGH (ref 70–99)
Potassium: 4.1 mmol/L (ref 3.5–5.1)
Sodium: 128 mmol/L — ABNORMAL LOW (ref 135–145)
Total Bilirubin: 0.4 mg/dL (ref 0.3–1.2)
Total Protein: 6.3 g/dL — ABNORMAL LOW (ref 6.5–8.1)

## 2019-05-16 LAB — DIFFERENTIAL
Abs Immature Granulocytes: 0.09 10*3/uL — ABNORMAL HIGH (ref 0.00–0.07)
Basophils Absolute: 0 10*3/uL (ref 0.0–0.1)
Basophils Relative: 0 %
Eosinophils Absolute: 0 10*3/uL (ref 0.0–0.5)
Eosinophils Relative: 0 %
Immature Granulocytes: 1 %
Lymphocytes Relative: 11 %
Lymphs Abs: 0.8 10*3/uL (ref 0.7–4.0)
Monocytes Absolute: 0.6 10*3/uL (ref 0.1–1.0)
Monocytes Relative: 8 %
Neutro Abs: 5.6 10*3/uL (ref 1.7–7.7)
Neutrophils Relative %: 80 %

## 2019-05-16 LAB — CBC
HCT: 24.8 % — ABNORMAL LOW (ref 36.0–46.0)
Hemoglobin: 8.1 g/dL — ABNORMAL LOW (ref 12.0–15.0)
MCH: 30 pg (ref 26.0–34.0)
MCHC: 32.7 g/dL (ref 30.0–36.0)
MCV: 91.9 fL (ref 80.0–100.0)
Platelets: 374 10*3/uL (ref 150–400)
RBC: 2.7 MIL/uL — ABNORMAL LOW (ref 3.87–5.11)
RDW: 20.4 % — ABNORMAL HIGH (ref 11.5–15.5)
WBC: 7 10*3/uL (ref 4.0–10.5)
nRBC: 0 % (ref 0.0–0.2)

## 2019-05-16 LAB — I-STAT CHEM 8, ED
BUN: 32 mg/dL — ABNORMAL HIGH (ref 8–23)
Calcium, Ion: 1.07 mmol/L — ABNORMAL LOW (ref 1.15–1.40)
Chloride: 91 mmol/L — ABNORMAL LOW (ref 98–111)
Creatinine, Ser: 2.5 mg/dL — ABNORMAL HIGH (ref 0.44–1.00)
Glucose, Bld: 170 mg/dL — ABNORMAL HIGH (ref 70–99)
HCT: 26 % — ABNORMAL LOW (ref 36.0–46.0)
Hemoglobin: 8.8 g/dL — ABNORMAL LOW (ref 12.0–15.0)
Potassium: 4.1 mmol/L (ref 3.5–5.1)
Sodium: 127 mmol/L — ABNORMAL LOW (ref 135–145)
TCO2: 25 mmol/L (ref 22–32)

## 2019-05-16 LAB — RAPID URINE DRUG SCREEN, HOSP PERFORMED
Amphetamines: NOT DETECTED
Barbiturates: NOT DETECTED
Benzodiazepines: NOT DETECTED
Cocaine: NOT DETECTED
Opiates: NOT DETECTED
Tetrahydrocannabinol: NOT DETECTED

## 2019-05-16 LAB — PROTIME-INR
INR: 1.1 (ref 0.8–1.2)
Prothrombin Time: 14.2 seconds (ref 11.4–15.2)

## 2019-05-16 LAB — TROPONIN I (HIGH SENSITIVITY)
Troponin I (High Sensitivity): 35 ng/L — ABNORMAL HIGH (ref <18)
Troponin I (High Sensitivity): 15 ng/L (ref <18)

## 2019-05-16 LAB — ETHANOL: Alcohol, Ethyl (B): <10 mg/dL (ref <10)

## 2019-05-16 LAB — APTT: aPTT: 28 seconds (ref 24–36)

## 2019-05-16 MED ORDER — SODIUM CHLORIDE 0.9 % IV BOLUS
1000.0000 mL | Freq: Once | INTRAVENOUS | Status: AC
  Administered 2019-05-16: 1000 mL via INTRAVENOUS

## 2019-05-16 NOTE — Telephone Encounter (Signed)
FYI

## 2019-05-16 NOTE — Telephone Encounter (Signed)
Incoming call from Patient husband  With complaint of patient falling and being confused.  Husband states that he could not get wife off the floor. Son  walks in .  Overheard son calling EMS. Patient was alert yet confused.   Unable to fully triage patient.        Answer Assessment - Initial Assessment Questions 1. LEVEL OF CONSCIOUSNESS: "How is he (she, the patient) acting right now?" (e.g., alert-oriented, confused, lethargic, stuporous, comatose)     *No Answer* 2. ONSET: "When did the confusion start?"  (minutes, hours, days)     *No Answer* 3. PATTERN "Does this come and go, or has it been constant since it started?"  "Is it present now?"     *No Answer* 4. ALCOHOL or DRUGS: "Has he been drinking alcohol or taking any drugs?"      *No Answer* 5. NARCOTIC MEDICATIONS: "Has he been receiving any narcotic medications?" (e.g., morphine, Vicodin)     *No Answer* 6. CAUSE: "What do you think is causing the confusion?"      *No Answer* 7. OTHER SYMPTOMS: "Are there any other symptoms?" (e.g., difficulty breathing, headache, fever, weakness)     *No Answer*  Protocols used: CONFUSION - DELIRIUM-A-AH

## 2019-05-16 NOTE — ED Triage Notes (Signed)
Pt BIB [facility] from home. Per pt son on scene pt began having left sided deficits around 1100 this morning and altered mental status. Upon EMS arrival, pt A&O x4 with no  Complaints at this time. Pt started Lexapro 2 weeks ago.

## 2019-05-16 NOTE — Progress Notes (Signed)
COMMUNITY PALLIATIVE CARE SW NOTE  PATIENT NAME: Samantha Mckenzie DOB: 10/28/1935 MRN: [id number]  PRIMARY CARE PROVIDER: Colon Branch, MD  RESPONSIBLE PARTY:  Acct ID - Guarantor Home Phone Work Phone Relationship Acct Type  [id number] ANJELI, [phone]  Self P/F     [address], [place], [place] [address]     PLAN OF CARE and INTERVENTIONS:             1. GOALS OF CARE/ ADVANCE CARE PLANNING:  Patient wishes to be pain free.  She is a DNR. 2. SOCIAL/EMOTIONAL/SPIRITUAL ASSESSMENT/ INTERVENTIONS:  SW met with patient to provide counseling during this visit.  Patient was in her recliner in the living room.  She stated she has not eaten today and has not taken her medication.  She recalled being in the hospital recently.  She also acknowledged that the family has spoken with her about a SNF.  She was speaking in [place] periodically.  SW spoke with patient's husband, Samantha Mckenzie, who was in his office.  Samantha Mckenzie stated  patient does speak Spanish.  He did not know if patient had taken her medication today.  Encouraged him to obtain a pill box for her.  He feels she is declining and plans on calling Dr. Larose Kells.  SW informed team member. 3. PATIENT/CAREGIVER EDUCATION/ COPING:  Patient's husband copes by expressing his feelings openly. 4. PERSONAL EMERGENCY PLAN:  Family will contact EMS. 5. COMMUNITY RESOURCES COORDINATION/ HEALTH CARE NAVIGATION:  None. 6. FINANCIAL/LEGAL CONCERNS/INTERVENTIONS:  None.     SOCIAL HX:  Social History   Tobacco Use  . Smoking status: Never Smoker  . Smokeless tobacco: Never Used  Substance Use Topics  . Alcohol use: Not Currently    Alcohol/week: 0.0 standard drinks    Comment: rarely    CODE STATUS:  DNR  ADVANCED DIRECTIVES: N MOST FORM COMPLETE:  N HOSPICE EDUCATION PROVIDED: N PPS:  Patient reports decreased appetite.  She had her walker in front of her today. Duration of visit and documentation:  45 minutes.      Creola Corn Mareli Antunes,  LCSW

## 2019-05-16 NOTE — ED Notes (Signed)
Patient transported to CT 

## 2019-05-16 NOTE — ED Notes (Addendum)
Pt husband [name] [phone]

## 2019-05-16 NOTE — ED Provider Notes (Signed)
[facility] EMERGENCY DEPARTMENT Provider Note   CSN: [id number] Arrival date & time: 05/16/19  1406    History   Chief Complaint Chief Complaint  Patient presents with  . Altered Mental Status    HPI Samantha Mckenzie is a 83 y.o. female.     83 yo F with a chief complaint of fall and some confusion.  This was noted earlier today.  Patient has no remembrance of any such activity.  She denies any complaints.  Later states that she thinks she may have fallen today or maybe it was last week.  States that she has pain to the back of her head.  Denies pain anywhere else.  Denies chest pain abdominal pain trouble breathing vomiting diarrhea lower extremity pain.  Patient states that her son is trying to put her into a nursing home.  Level 5 caveat dementia.  The history is provided by the patient.  Altered Mental Status Associated symptoms: no fever, no headaches, no nausea, no palpitations and no vomiting   Illness Severity:  Moderate Onset quality:  Gradual Duration:  1 day Timing:  Constant Progression:  Unchanged Chronicity:  New Associated symptoms: no chest pain, no congestion, no fever, no headaches, no myalgias, no nausea, no rhinorrhea, no shortness of breath, no vomiting and no wheezing     Past Medical History:  Diagnosis Date  . Allergic rhinitis   . Anemia   . Breast CA ([place])    surgery, chemo, XRT; had peripheral neuropathy (imbalance at times) after chemo  . Cellulitis    Left arm, recurrent   . Cellulitis LEFT arm recurrent 12/08/2011  . Chronic renal disease, stage IV (HCC)    Dr. Moshe Cipro  . Depression   . Fatigue   . GERD (gastroesophageal reflux disease)    gastritis, EGD 02/2007  . Gout   . Hyperlipidemia   . Hypertension   . Macular degeneration 2011  . Normal cardiac stress test 12/2014  . Osteoarthritis   . Renal insufficiency     Patient Active Problem List   Diagnosis Date Noted  . Ileitis, terminal ([place]) 04/22/2019  .  COPD (chronic obstructive pulmonary disease) ([place]) 04/12/2019  . Enteritis 04/07/2019  . Chronic diastolic CHF (congestive heart failure) ([place]) 04/07/2019  . Hypokalemia 04/07/2019  . Hypocalcemia 04/07/2019  . Fall 04/07/2019  . Occult blood positive stool 04/07/2019  . Adjustment disorder with depressed mood   . Generalized abdominal pain   . Dysphasia 03/21/2019  . CAP (community acquired pneumonia) 03/20/2019  . Nausea vomiting and diarrhea 03/20/2019  . Acute respiratory failure with hypoxia ([place]) 03/20/2019  . Acute renal failure superimposed on stage 4 chronic kidney disease ([place]) 03/20/2019  . Lactic acidosis 03/20/2019  . Diabetic peripheral neuropathy associated with type 2 diabetes mellitus ([place]) 03/20/2019  . Hypomagnesemia 06/21/2018  . Generalized weakness 06/20/2018  . PCP NOTES >>> 07/11/2015  . Depression   . VITAMIN B12 DEFICIENCY 04/01/2008  . GERD 12/19/2007  . NEOP, MALIGNANT, FEMALE BREAST NOS 07/07/2007  . Osteoarthritis 07/07/2007  . Diabetes mellitus type 2 with complications ([place]) 08/65/7846  . Hyperlipidemia 02/28/2007  . GOUT 02/28/2007  . Normocytic anemia 02/28/2007  . Essential hypertension 02/28/2007    Past Surgical History:  Procedure Laterality Date  . cataracts bilaterally  1 -2012  . LUNG BIOPSY  1999   neg  . MASTECTOMY Left 1999   and lymphnodes      OB History   No obstetric history on file.  Home Medications    Prior to Admission medications   Medication Sig Start Date End Date Taking? Authorizing Provider  acetaminophen (TYLENOL) 325 MG tablet Take 2 tablets (650 mg total) by mouth every 6 (six) hours as needed for mild pain, fever or headache (or Fever >/= 101). 04/12/19   Emokpae, Courage, MD  albuterol (PROVENTIL) (2.5 MG/3ML) 0.083% nebulizer solution Take 3 mLs (2.5 mg total) by nebulization every 4 (four) hours as needed for wheezing or shortness of breath. 04/12/19   Roxan Hockey, MD  allopurinol (ZYLOPRIM) 100  MG tablet Take 1 tablet (100 mg total) by mouth daily. 03/19/19   Colon Branch, MD  amLODipine (NORVASC) 10 MG tablet Take 1 tablet (10 mg total) by mouth daily. 03/06/19   Colon Branch, MD  aspirin 81 MG tablet Take 1 tablet (81 mg total) by mouth daily with breakfast. 04/12/19   Roxan Hockey, MD  atorvastatin (LIPITOR) 20 MG tablet Take 1 tablet (20 mg total) by mouth daily. 01/31/19   Colon Branch, MD  Calcium Carb-Cholecalciferol (CALTRATE 600+D3 PO) Take 1 tablet by mouth daily.    [provider]  Cholecalciferol (VITAMIN D3) 1000 UNITS CAPS Take 1 capsule by mouth daily.     [provider]  dicyclomine (BENTYL) 10 MG capsule Take 2 capsules (20 mg total) by mouth 4 (four) times daily -  before meals and at bedtime. 05/10/19   Colon Branch, MD  dronabinol (MARINOL) 2.5 MG capsule Take 1 capsule (2.5 mg total) by mouth 2 (two) times daily before a meal. 05/10/19   Colon Branch, MD  escitalopram (LEXAPRO) 10 MG tablet Take 1 tablet (10 mg total) by mouth daily. 05/10/19   Colon Branch, MD  hydrOXYzine (ATARAX/VISTARIL) 10 MG tablet Take 1 tablet (10 mg total) by mouth 3 (three) times daily as needed for nausea. 04/12/19   Roxan Hockey, MD  loperamide (IMODIUM A-D) 2 MG tablet Take 2 mg by mouth 4 (four) times daily as needed for diarrhea or loose stools.    [provider]  metoprolol tartrate (LOPRESSOR) 25 MG tablet Take 1 tablet (25 mg total) by mouth 2 (two) times daily. 04/12/19   Roxan Hockey, MD  Multiple Vitamins-Minerals (CENTRUM SILVER PO) Take 1 tablet by mouth daily.     [provider]  ondansetron (ZOFRAN-ODT) 4 MG disintegrating tablet Take 1 tablet (4 mg total) by mouth every 6 (six) hours as needed for nausea or vomiting. 04/23/19   Debbe Odea, MD  pantoprazole (PROTONIX) 40 MG tablet Take 1 tablet (40 mg total) by mouth daily before breakfast. 05/10/19   Colon Branch, MD  vitamin B-12 (CYANOCOBALAMIN) 1000 MCG tablet Take 1,000 mcg by mouth daily.       [provider]  vitamin C (ASCORBIC ACID) 500 MG tablet Take 500 mg by mouth daily.    [provider]    Family History Family History  Problem Relation Age of Onset  . Breast cancer Other        ? of   . Heart attack Father 1  . Diabetes Sister        2  . Hypertension Sister   . Colon cancer Neg Hx   . Stomach cancer Neg Hx     Social History Social History   Tobacco Use  . Smoking status: Never Smoker  . Smokeless tobacco: Never Used  Substance Use Topics  . Alcohol use: Not Currently    Alcohol/week: 0.0  standard drinks    Comment: rarely  . Drug use: No     Allergies   Codeine, Morphine, Oxycodone-acetaminophen, Sulfonamide derivatives, and Tramadol hcl   Review of Systems Review of Systems  Constitutional: Negative for chills and fever.  HENT: Negative for congestion and rhinorrhea.   Eyes: Negative for redness and visual disturbance.  Respiratory: Negative for shortness of breath and wheezing.   Cardiovascular: Negative for chest pain and palpitations.  Gastrointestinal: Negative for nausea and vomiting.  Genitourinary: Negative for dysuria and urgency.  Musculoskeletal: Negative for arthralgias and myalgias.  Skin: Negative for pallor and wound.  Neurological: Negative for dizziness and headaches.     Physical Exam Updated Vital Signs BP 137/65   Pulse 75   Temp 98 F (36.7 C) (Oral)   Resp 14   Ht 5' 2"  (1.575 m)   Wt 59 kg   SpO2 98%   BMI 23.78 kg/m   Physical Exam Vitals signs and nursing note reviewed.  Constitutional:      General: She is not in acute distress.    Appearance: She is well-developed. She is not diaphoretic.  HENT:     Head: Normocephalic and atraumatic.     Comments: Mildly tender to the occiput.  No appreciable hematoma.  No midline spinal tenderness.  Able to rotate her head 45 degrees in either direction without pain. Eyes:     Pupils: Pupils are equal, round, and reactive to light.  Neck:      Musculoskeletal: Normal range of motion and neck supple.  Cardiovascular:     Rate and Rhythm: Normal rate and regular rhythm.     Heart sounds: No murmur. No friction rub. No gallop.   Pulmonary:     Effort: Pulmonary effort is normal.     Breath sounds: No wheezing or rales.  Abdominal:     General: There is no distension.     Palpations: Abdomen is soft.     Tenderness: There is no abdominal tenderness.  Musculoskeletal:        General: No tenderness.     Comments: Palpated from head to toe without any obvious signs of trauma or areas of bony tenderness.  Skin:    General: Skin is warm and dry.  Neurological:     Mental Status: She is alert and oriented to person, place, and time.  Psychiatric:        Behavior: Behavior normal.      ED Treatments / Results  Labs (all labs ordered are listed, but only abnormal results are displayed) Labs Reviewed  CBC - Abnormal; Notable for the following components:      Result Value   RBC 2.70 (*)    Hemoglobin 8.1 (*)    HCT 24.8 (*)    RDW 20.4 (*)    All other components within normal limits  DIFFERENTIAL - Abnormal; Notable for the following components:   Abs Immature Granulocytes 0.09 (*)    All other components within normal limits  COMPREHENSIVE METABOLIC PANEL - Abnormal; Notable for the following components:   Sodium 128 (*)    Chloride 92 (*)    Glucose, Bld 173 (*)    BUN 33 (*)    Creatinine, Ser 2.49 (*)    Calcium 8.4 (*)    Total Protein 6.3 (*)    Albumin 2.1 (*)    GFR calc non Af Amer 18 (*)    GFR calc Af Amer 20 (*)    All other components  within normal limits  URINALYSIS, ROUTINE W REFLEX MICROSCOPIC - Abnormal; Notable for the following components:   Color, Urine STRAW (*)    Specific Gravity, Urine 1.004 (*)    All other components within normal limits  I-STAT CHEM 8, ED - Abnormal; Notable for the following components:   Sodium 127 (*)    Chloride 91 (*)    BUN 32 (*)    Creatinine, Ser 2.50  (*)    Glucose, Bld 170 (*)    Calcium, Ion 1.07 (*)    Hemoglobin 8.8 (*)    HCT 26.0 (*)    All other components within normal limits  TROPONIN I (HIGH SENSITIVITY) - Abnormal; Notable for the following components:   Troponin I (High Sensitivity) 35 (*)    All other components within normal limits  ETHANOL  PROTIME-INR  APTT  RAPID URINE DRUG SCREEN, HOSP PERFORMED  TROPONIN I (HIGH SENSITIVITY)    EKG EKG Interpretation  Date/Time:  Wednesday May 16 2019 14:11:54 EDT Ventricular Rate:  73 PR Interval:    QRS Duration: 92 QT Interval:  399 QTC Calculation: 440 R Axis:   -16 Text Interpretation:  Sinus rhythm Probable left atrial enlargement Left ventricular hypertrophy Inferior infarct, old No STEMI  Confirmed by Nanda Quinton [phone]) on 05/16/2019 3:12:13 PM   Radiology Dg Chest 2 View  Result Date: 05/16/2019 CLINICAL DATA:  Altered mental status, left-sided deficit beginning at 11 a.m. this morning. Left shoulder pain EXAM: CHEST - 2 VIEW COMPARISON:  Numerous priors, most recent March 19, 2019 FINDINGS: Streaky bandlike areas of atelectasis are present right mid lung and left lung base. Postsurgical changes are present the left lung. The lungs are otherwise clear. No pneumothorax. No effusions. Stable appearance of the cardiomediastinal contours including rightward deviation of the trachea. Surgical clips project over the left axilla. No acute osseous or soft tissue abnormality. Postthoracotomy changes with prior left rib resection and remote rib fractures. Surgical clips project in the left axilla. IMPRESSION: Bilateral atelectasis. No acute cardiopulmonary disease. Electronically Signed   By: Lovena Le M.D.   On: 05/16/2019 16:12   Ct Head Wo Contrast  Result Date: 05/16/2019 CLINICAL DATA:  TIA, multiple falls, hit back of head EXAM: CT HEAD WITHOUT CONTRAST TECHNIQUE: Contiguous axial images were obtained from the base of the skull through the vertex without intravenous  contrast. COMPARISON:  CT brain 05/01/2019 FINDINGS: Brain: No acute territorial infarction, hemorrhage or intracranial mass. Atrophy and minimal small vessel ischemic change of the white matter. Stable ventricle size. Vascular: No hyperdense vessels. Vertebral and carotid vascular calcification Skull: Normal. Negative for fracture or focal lesion. Sinuses/Orbits: No acute finding. Mild mucosal thickening in the maxillary and ethmoid sinuses Other: None IMPRESSION: 1. No CT evidence for acute intracranial abnormality. 2. Atrophy and minimal small vessel ischemic changes of the white matter Electronically Signed   By: Donavan Foil M.D.   On: 05/16/2019 17:49   Dg Shoulder Left  Result Date: 05/16/2019 CLINICAL DATA:  Altered mental status, left-sided deficits, left shoulder pain EXAM: LEFT SHOULDER - 2+ VIEW COMPARISON:  None. FINDINGS: No evidence of fracture or traumatic malalignment. Degenerative changes of the acromioclavicular and lesser changes at the glenohumeral joint. Coracoclavicular and acromioclavicular intervals are maintained. Postsurgical changes are noted in the left axilla as well as postthoracotomy changes prior rib resection and several remote deformities of the left-sided ribs as well as suture material within the left lung. The aorta is calcified. IMPRESSION: 1. No acute osseous abnormality.  2. Mild acromioclavicular and glenohumeral degenerative changes. 3. Post thoracotomy changes left chest wall. Postsurgical changes left axilla. Electronically Signed   By: Lovena Le M.D.   On: 05/16/2019 16:13    Procedures Procedures (including critical care time)  Medications Ordered in ED Medications  sodium chloride 0.9 % bolus 1,000 mL (1,000 mLs Intravenous New Bag/Given 05/16/19 1856)     Initial Impression / Assessment and Plan / ED Course  I have reviewed the triage vital signs and the nursing notes.  Pertinent labs & imaging results that were available during my care of the  patient were reviewed by me and considered in my medical decision making (see chart for details).        83 yo F with a chief complaints of fall and confusion.  Patient denies any such complaint.  Patient seemed very oriented on my initial exam but was somewhat confused about whether or not she had fallen today.  Will obtain a CT scan of the head lab work.  There is some report of left-sided weakness though I do not appreciate any on exam.  She does tell me that she has been having pain to her left arm for some time.  I called the patient's husband who stated that she was more confused than normal this morning.  States that she has been having transient confusion for at least the past 7 weeks.  Has been seeing the PCP for the same and has been in and out of nursing homes but she has been refusing to stay.  No infectious symptoms per him.  No chest pain or trouble breathing or headaches.  I discussed the work-up with him thus far.  Discussed that I felt that she would likely be discharged home.  Second trop normal.  Patient without complaint. D/c home.  PCP follow up.   8:01 PM:  I have discussed the diagnosis/risks/treatment options with the patient and believe the pt to be eligible for discharge home to follow-up with PCP. We also discussed returning to the ED immediately if new or worsening sx occur. We discussed the sx which are most concerning (e.g., sudden worsening pain, fever, inability to tolerate by mouth) that necessitate immediate return. Medications administered to the patient during their visit and any new prescriptions provided to the patient are listed below.  Medications given during this visit Medications  sodium chloride 0.9 % bolus 1,000 mL (1,000 mLs Intravenous New Bag/Given 05/16/19 1856)     The patient appears reasonably screen and/or stabilized for discharge and I doubt any other medical condition or other [facility] requiring further screening, evaluation, or treatment in the ED  at this time prior to discharge.    Final Clinical Impressions(s) / ED Diagnoses   Final diagnoses:  Transient alteration of awareness    ED Discharge Orders    None       Deno Etienne, DO 05/16/19 2001

## 2019-05-16 NOTE — Telephone Encounter (Signed)
Copied from [place] [id number]. Topic: General - Inquiry >> May 16, 2019 10:43 AM Scherrie Gerlach wrote: Reason for CRM: husband is calling to ask to speak with Dr Larose Kells concerning the pt.  He states the pt is getting worse.  It is a woman he does not know anymore.  He states they need some help.  He states the hospice nurse cannot understand her as well.  She is speaking spanish intermixed with [place], which is not her norm. He does not know where to go from here.  Would like advice.

## 2019-05-16 NOTE — Assessment & Plan Note (Signed)
Failure to thrive: The patient is currently at home, she is stated "my son is determined to put me in nursing home".  I advised patient that I agree with her son, the best place for her to be is in a place that she can get more support. Nevertheless I told her I would talk with her son if she  grants me permission and she did. Anorexia has improved with the use of Marinol Recent fall: Strongly encouraged to use a walker. Left arm is hard to move, she is prone to cellulitis due to lymphedema in the arm however she denies running fever chills, the arm is not red or warm.  Recommend observation for now Hand shakiness, unclear etiology.  Recommend observation Palliative care following her, next visit according to the patient is tomorrow (05/15/2019). Addendum: I spoke with the patient's son 05/15/2019, was a brief conversation, I explained that the situation but then the telephone communication ended  and I was not able to contact him again.

## 2019-05-16 NOTE — Telephone Encounter (Signed)
Patient called 911 before I was able to call her back.  Currently at the ER

## 2019-05-16 NOTE — ED Notes (Signed)
Called fam to inform of discharge. Samantha Mckenzie is on the way per spouse.

## 2019-05-17 ENCOUNTER — Ambulatory Visit: Payer: Self-pay | Admitting: Internal Medicine

## 2019-05-17 NOTE — Telephone Encounter (Addendum)
I spoke with her husband today. He confirmed they said "they never find anything". I explained that she was diagnosed with pneumonia and respiratory failure recently and had intestinal infection and she is simply not bouncing back due to a number of issues including her age, depression (now on SSRIs), etc. We know the dx: failure to thrive, we know the treatment :needs to be in a nursing home where she can get nutrition, assistance, all her medications and prevent falls. Unfortunately she has declined our help. Unless she is incompetent to make decisions, we have to respect her wishes.  He does not think she is incompetent.  "She is hardheaded" [name] states that he knows all of the above, that he and his son agreed that she needs to be in a NH but the patient has refused consistently. I spoke with the patient, discussed above as well, she sounds alert, oriented x3, reports that at this point she does not have nausea, vomiting or diarrhea. Recommend to call 911 again if she has problems, mental status changes, or if she changes her mind and decides to go to a nursing home.

## 2019-05-17 NOTE — Telephone Encounter (Signed)
Pts husband calling. Sates pt fell yesterday, called EMS, went to [facility] "Didn't find anything wrong." States fell this afternoon, EMS came to home and they declined transport to hospital "Because they never find anything." Today, unwitnessed fall. Spoke with patient, states "Just weak getting into shower." States yes when questioned if she hit her head. Said she received sutures. Husband on line and said that episode was a while ago. Pt oriented to place (home) and year. ROM WNL, denies any pain. Advised EMS as unwitnessed fall, declines. States just want Dr. Larose Kells to know. "They need to find out what's wrong with her." Husband states he will alert son to episodes.  Please advise: [phone]

## 2019-05-17 NOTE — Telephone Encounter (Signed)
FYI

## 2019-05-21 ENCOUNTER — Telehealth: Payer: Self-pay | Admitting: Internal Medicine

## 2019-05-21 ENCOUNTER — Other Ambulatory Visit: Payer: Self-pay | Admitting: *Deleted

## 2019-05-21 DIAGNOSIS — I10 Essential (primary) hypertension: Secondary | ICD-10-CM

## 2019-05-21 MED ORDER — ESCITALOPRAM OXALATE 10 MG PO TABS: 20.0000 mg | ORAL_TABLET | Freq: Every day | ORAL | 1 refills | Status: DC

## 2019-05-21 NOTE — Patient Outreach (Addendum)
Member assessed for potential [facility] Care Management needs as a benefit of [place] [employer].  Referral received by [facility] Director for [facility] Management follow up.   Member has had multiple hospital readmissions. She recently went to [facility] but left AMA after just being there for several hours. Per chart records, [facility] is following as well.  Writer attempted to outreach to Mrs. Perlow at [phone], no answer. Phone just rang and rang. Telephone call made to mobile at [phone]. Was able to leave HIPPA compliment voicemail message to request call back.  Mrs. Linskey has a history of breast cancer, anemia, peripheral neuropathy, left arm cellulitis, depression, failure to thrive, multiple falls, DM, HTN, HLD, CKD, gout, GERD.  Will make referral to [facility] for complex case management.   Marthenia Rolling, MSN-Ed, RN,BSN [facility] Acute Care Coordinator [phone] [facility]) [phone]  (Toll free office)

## 2019-05-21 NOTE — Patient Outreach (Addendum)
[facility]) Care Management  05/21/2019  Samantha Mckenzie 03-06-1936 [id number]  Transition of Care  Referral Received 05/21/2019 Initial outreach 05/21/2019  Initial outreach completed however call was unsuccessful in reaching this pt. RN was able to leave a HIPAA approved voice message.  Will plan another outreach call within the next 4 days.  [name], RN Care Management Coordinator [place] Office [phone]

## 2019-05-21 NOTE — Telephone Encounter (Signed)
Spoke w/ [name]- informed of increase in escitalopram to 64m daily. PSaralyn Pilarverbalized understanding.

## 2019-05-21 NOTE — Telephone Encounter (Signed)
(  Options are available including Wellbutrin or Remeron but will for now increased dose of Lexapro).  Advise family: I sent a new prescription for escitalopram 10 mg: Will increase to 2 tablets daily. It will take few days to a couple of weeks to kick in

## 2019-05-21 NOTE — Telephone Encounter (Signed)
Please advise 

## 2019-05-21 NOTE — Telephone Encounter (Signed)
Pts son called stating pts medication for depression is not working. Please advise.   [place], [address].  [address] [place] [address]  Phone: [phone] Fax: [phone]  Not a 24 hour pharmacy; exact hours not known.

## 2019-05-22 ENCOUNTER — Ambulatory Visit (INDEPENDENT_AMBULATORY_CARE_PROVIDER_SITE_OTHER): Payer: Medicare Other | Admitting: Internal Medicine

## 2019-05-22 ENCOUNTER — Other Ambulatory Visit: Payer: Self-pay

## 2019-05-22 ENCOUNTER — Telehealth: Payer: Self-pay

## 2019-05-22 DIAGNOSIS — K529 Noninfective gastroenteritis and colitis, unspecified: Secondary | ICD-10-CM

## 2019-05-22 DIAGNOSIS — R627 Adult failure to thrive: Secondary | ICD-10-CM | POA: Diagnosis not present

## 2019-05-22 DIAGNOSIS — F32A Depression, unspecified: Secondary | ICD-10-CM

## 2019-05-22 DIAGNOSIS — F329 Major depressive disorder, single episode, unspecified: Secondary | ICD-10-CM | POA: Diagnosis not present

## 2019-05-22 NOTE — Telephone Encounter (Signed)
If headache is severe or associated with fever, chills, neck pain or recent fall, she needs to be seen.

## 2019-05-22 NOTE — Progress Notes (Signed)
Subjective:    Patient ID: Samantha Mckenzie, female    DOB: 11-Dec-1935, 83 y.o.   MRN: [id number]  DOS:  05/22/2019 Type of visit - description: Attempted  to make this a video visit, due to technical difficulties from the patient side it was not possible  thus we proceeded with a Virtual Visit via Telephone    I connected with@   by telephone and verified that I am speaking with the correct person using two identifiers.  THIS ENCOUNTER IS A VIRTUAL VISIT DUE TO COVID-19 - PATIENT WAS NOT SEEN IN THE OFFICE. PATIENT HAS CONSENTED TO VIRTUAL VISIT / TELEMEDICINE VISIT   Location of patient: home  Location of provider: office  I discussed the limitations, risks, security and privacy concerns of performing an evaluation and management service by telephone and the availability of in person appointments. I also discussed with the patient that there may be a patient responsible charge related to this service. The patient expressed understanding and agreed to proceed.   History of Present Illness:  The patient's husband called, she reported headache, not eating, not drinking and refusing to take her medication.  My nurse call them, see separate documentation, they expressed that we are "playing yoyo" with his wife so I decided to talk to them directly.  This afternoon, patient reports that the headache is better Still has postprandial nausea and not eating too much. Admits to some depression. Reports that she had a seizure this morning although episode is unclear, no bladder or bowel incontinence, no actual uncontrollable tremors that they can tell..   Review of Systems  No fever chills Diarrhea resolved No abdominal pain Past Medical History:  Diagnosis Date  . Allergic rhinitis   . Anemia   . Breast CA ([place])    surgery, chemo, XRT; had peripheral neuropathy (imbalance at times) after chemo  . Cellulitis    Left arm, recurrent   . Cellulitis LEFT arm recurrent 12/08/2011  . Chronic  renal disease, stage IV (HCC)    Dr. Moshe Cipro  . Depression   . Fatigue   . GERD (gastroesophageal reflux disease)    gastritis, EGD 02/2007  . Gout   . Hyperlipidemia   . Hypertension   . Macular degeneration 2011  . Normal cardiac stress test 12/2014  . Osteoarthritis   . Renal insufficiency     Past Surgical History:  Procedure Laterality Date  . cataracts bilaterally  1 -2012  . LUNG BIOPSY  1999   neg  . MASTECTOMY Left 1999   and lymphnodes     Social History   Socioeconomic History  . Marital status: Married    Spouse name: Not on file  . Number of children: 2  . Years of education: Not on file  . Highest education level: Not on file  Occupational History  . Occupation: retired     Fish farm manager: RETIRED  Social Needs  . Financial resource strain: Not on file  . Food insecurity    Worry: Not on file    Inability: Not on file  . Transportation needs    Medical: Not on file    Non-medical: Not on file  Tobacco Use  . Smoking status: Never Smoker  . Smokeless tobacco: Never Used  Substance and Sexual Activity  . Alcohol use: Not Currently    Alcohol/week: 0.0 standard drinks    Comment: rarely  . Drug use: No  . Sexual activity: Not Currently  Lifestyle  . Physical activity  Days per week: Not on file    Minutes per session: Not on file  . Stress: Not on file  Relationships  . Social Herbalist on phone: Not on file    Gets together: Not on file    Attends religious service: Not on file    Active member of club or organization: Not on file    Attends meetings of clubs or organizations: Not on file    Relationship status: Not on file  . Intimate partner violence    Fear of current or ex partner: Not on file    Emotionally abused: Not on file    Physically abused: Not on file    Forced sexual activity: Not on file  Other Topics Concern  . Not on file  Social History Narrative   Lives w/ husband , 2 children in [place], 2 [place] as of 05/22/2019      Reactions   Codeine Nausea And Vomiting   Morphine Nausea And Vomiting   Oxycodone-acetaminophen Nausea And Vomiting   Sulfonamide Derivatives Nausea And Vomiting   Tramadol Hcl Nausea And Vomiting      Medication List       Accurate as of May 22, 2019  4:32 PM. If you have any questions, ask your nurse or doctor.        acetaminophen 325 MG tablet Commonly known as: TYLENOL Take 2 tablets (650 mg total) by mouth every 6 (six) hours as needed for mild pain, fever or headache (or Fever >/= 101).   albuterol (2.5 MG/3ML) 0.083% nebulizer solution Commonly known as: PROVENTIL Take 3 mLs (2.5 mg total) by nebulization every 4 (four) hours as needed for wheezing or shortness of breath.   allopurinol 100 MG tablet Commonly known as: ZYLOPRIM Take 1 tablet (100 mg total) by mouth daily.   amLODipine 10 MG tablet Commonly known as: NORVASC Take 1 tablet (10 mg total) by mouth daily.   aspirin 81 MG tablet Take 1 tablet (81 mg total) by mouth daily with breakfast.   atorvastatin 20 MG tablet Commonly known as: LIPITOR Take 1 tablet (20 mg total) by mouth daily.   CALTRATE 600+D3 PO Take 1 tablet by mouth daily.   CENTRUM SILVER PO Take 1 tablet by mouth daily.   dicyclomine 10 MG capsule Commonly known as: BENTYL Take 2 capsules (20 mg total) by mouth 4 (four) times daily -  before meals and at bedtime.   dronabinol 2.5 MG capsule Commonly known as: Marinol Take 1 capsule (2.5 mg total) by mouth 2 (two) times daily before a meal.   escitalopram 10 MG tablet Commonly known as: Lexapro Take 2 tablets (20 mg total) by mouth daily.   hydrOXYzine 10 MG tablet Commonly known as: ATARAX/VISTARIL Take 1 tablet (10 mg total) by mouth 3 (three) times daily as needed for nausea.   loperamide 2 MG tablet Commonly known as: IMODIUM A-D Take 2 mg by mouth 4 (four) times daily as needed for diarrhea or loose stools.   metoprolol  tartrate 25 MG tablet Commonly known as: LOPRESSOR Take 1 tablet (25 mg total) by mouth 2 (two) times daily.   ondansetron 4 MG disintegrating tablet Commonly known as: ZOFRAN-ODT Take 1 tablet (4 mg total) by mouth every 6 (six) hours as needed for nausea or vomiting.   pantoprazole 40 MG tablet Commonly known as: PROTONIX Take 1 tablet (40  mg total) by mouth daily before breakfast.   vitamin B-12 1000 MCG tablet Commonly known as: CYANOCOBALAMIN Take 1,000 mcg by mouth daily.   vitamin C 500 MG tablet Commonly known as: ASCORBIC ACID Take 500 mg by mouth daily.   Vitamin D3 25 MCG (1000 UT) Caps Take 1 capsule by mouth daily.           Objective:   Physical Exam There were no vitals taken for this visit. This is virtual phone visit.    Assessment        Assessment  DM- neuropathy (paresthesias, nl pinprick exam), CKD HTN Hyperlipidemia: Lipitor intolerant? CAD? States see Dr Terrence Dupont, no OV  records. (-) stress test 12-2014 CKD -- sees nephrology Gout GERD, nl EGD 01-2015  H/o Depression Leg pain-- RLS vs neuropathy, on gabapentin since 2013, good results H/o anemia --- no iron def , Cscope 2012, normal EGD 01-2015 Breast cancer:  --S/p Surgery (L mastectomy), chemotherapy, XRT --Peripheral neuropathy felt to be due to chemotherapy --Recurrent left arm cellulitis DJD  PLAN This is a virtual phone visit, I had a chance to talk with the patient, with [name] and the patient's son [name]. I explained [name] that the patient's diagnosis is failure to thrive, he wonders if his father -who has power of attorney - could start to take decisions for her and I advised him that she will need a formal psych evaluation to determine if she is incapable to make her own decisions. Is [name]'s observation that [name] is alert oriented x3 with only occasional episodes of confusion.  He also said that he agrees with his father that the patient is "hardheaded". We agreed that he  will call [facility] and try to get an appointment with them.  Goal is to determine competency, I also  wonder if she is having   frontotemporal dementia..  Failure to thrive: See above.  I again recommend [name] to keep herself hydrated and take the medications as prescribed. Family needs to remain vigilant about new symptoms Depression: I recently recommend to increase Lexapro to 2 tablets daily Seizures?  Advised patient, if she ever feels she is having a seizure again is to go to the hospital Palliative care: Will ask them to visit the patient again   Today, I spent more than 35   min with the patient: >50% of the time counseling regards failure to thrive, depression.  Education provided to the patient and family regards to diagnosis of failure to thrive.  Asking them to be vigilant to new symptoms.      I discussed the assessment and treatment plan with the patient. The patient was provided an opportunity to ask questions and all were answered. The patient agreed with the plan and demonstrated an understanding of the instructions.   The patient was advised to call back or seek an in-person evaluation if the symptoms worsen or if the condition fails to improve as anticipated.  I provided 35 minutes of non-face-to-face time during this encounter.  Kathlene November, MD

## 2019-05-22 NOTE — Telephone Encounter (Signed)
Copied from [place] [id number]. Topic: General - Call Back - No Documentation >> May 22, 2019  9:09 AM Erick Blinks wrote: Reason for CRM: Pt's husband called reporting that wife is refusing to take medication but is complaining that her head is hurting. He is urgently requesting a call back from a nurse or anyone clinical to assist him this morning.  Best contact: [phone] 8 weeks, she throws medication on the floor instead of taking it

## 2019-05-22 NOTE — Telephone Encounter (Signed)
Tried calling [name], Pt's husband, no answer, unable to leave message- per Dr. Larose Kells can't make Pt take meds- she should be seen if any of the below (see PCP message) and should f/u w/ palliative care. Okay for PEC to discuss.

## 2019-05-22 NOTE — Telephone Encounter (Signed)
[name] called back- gave him PCP recommendations- was on the phone > 10 mins- Pt stated she feels like she is having a seizure running through her head- informed that if she feels that way she needs to go to ED- [name] states she has gone 6-7 times and they find nothing wrong with her- tried to explain to him what "adult failure to thrive" is. He states he feels that we are playing  "yo-yo" with his wife and that no one cares. Informed him that PCP will call when he gets done with his last visit.

## 2019-05-23 NOTE — Assessment & Plan Note (Signed)
This is a virtual phone visit, I had a chance to talk with the patient, with Samantha Mckenzie and the patient's son Samantha Mckenzie. I explained Samantha Mckenzie that the patient's diagnosis is failure to thrive, he wonders if his father -who has power of attorney - could start to take decisions for her and I advised him that she will need a formal psych evaluation to determine if she is incapable to make her own decisions. Is Samantha Mckenzie's observation that Samantha Mckenzie is alert oriented x3 with only occasional episodes of confusion.  He also said that he agrees with his father that the patient is "hardheaded". We agreed that he will call [facility] and try to get an appointment with them.  Goal is to determine competency, I also  wonder if she is having   frontotemporal dementia..  Failure to thrive: See above.  I again recommend [name] to keep herself hydrated and take the medications as prescribed. Family needs to remain vigilant about new symptoms Depression: I recently recommend to increase Lexapro to 2 tablets daily Seizures?  Advised patient, if she ever feels she is having a seizure again is to go to the hospital Palliative care: Will ask them to visit the patient again

## 2019-05-24 ENCOUNTER — Emergency Department (HOSPITAL_COMMUNITY)
Admission: EM | Admit: 2019-05-24 | Discharge: 2019-05-25 | Disposition: A | Discharge status: Home / Self Care | Payer: Medicare Other | Attending: Emergency Medicine | Admitting: Emergency Medicine

## 2019-05-24 ENCOUNTER — Other Ambulatory Visit: Payer: Self-pay | Admitting: *Deleted

## 2019-05-24 ENCOUNTER — Other Ambulatory Visit: Payer: Self-pay

## 2019-05-24 ENCOUNTER — Encounter (HOSPITAL_COMMUNITY): Payer: Self-pay | Admitting: Emergency Medicine

## 2019-05-24 ENCOUNTER — Emergency Department (HOSPITAL_COMMUNITY): Payer: Medicare Other

## 2019-05-24 ENCOUNTER — Telehealth: Payer: Self-pay | Admitting: Internal Medicine

## 2019-05-24 ENCOUNTER — Telehealth: Payer: Self-pay | Admitting: *Deleted

## 2019-05-24 DIAGNOSIS — R42 Dizziness and giddiness: Secondary | ICD-10-CM | POA: Diagnosis not present

## 2019-05-24 DIAGNOSIS — F322 Major depressive disorder, single episode, severe without psychotic features: Secondary | ICD-10-CM | POA: Insufficient documentation

## 2019-05-24 DIAGNOSIS — N184 Chronic kidney disease, stage 4 (severe): Secondary | ICD-10-CM | POA: Insufficient documentation

## 2019-05-24 DIAGNOSIS — Z20828 Contact with and (suspected) exposure to other viral communicable diseases: Secondary | ICD-10-CM | POA: Diagnosis not present

## 2019-05-24 DIAGNOSIS — R110 Nausea: Secondary | ICD-10-CM | POA: Diagnosis not present

## 2019-05-24 DIAGNOSIS — R51 Headache: Secondary | ICD-10-CM | POA: Insufficient documentation

## 2019-05-24 DIAGNOSIS — I129 Hypertensive chronic kidney disease with stage 1 through stage 4 chronic kidney disease, or unspecified chronic kidney disease: Secondary | ICD-10-CM | POA: Insufficient documentation

## 2019-05-24 DIAGNOSIS — R519 Headache, unspecified: Secondary | ICD-10-CM

## 2019-05-24 LAB — CBC
HCT: 27.7 % — ABNORMAL LOW (ref 36.0–46.0)
Hemoglobin: 9.1 g/dL — ABNORMAL LOW (ref 12.0–15.0)
MCH: 30.5 pg (ref 26.0–34.0)
MCHC: 32.9 g/dL (ref 30.0–36.0)
MCV: 93 fL (ref 80.0–100.0)
Platelets: 548 10*3/uL — ABNORMAL HIGH (ref 150–400)
RBC: 2.98 MIL/uL — ABNORMAL LOW (ref 3.87–5.11)
RDW: 20.8 % — ABNORMAL HIGH (ref 11.5–15.5)
WBC: 10.8 10*3/uL — ABNORMAL HIGH (ref 4.0–10.5)
nRBC: 0 % (ref 0.0–0.2)

## 2019-05-24 LAB — COMPREHENSIVE METABOLIC PANEL
ALT: 14 U/L (ref 0–44)
AST: 31 U/L (ref 15–41)
Albumin: 2 g/dL — ABNORMAL LOW (ref 3.5–5.0)
Alkaline Phosphatase: 77 U/L (ref 38–126)
Anion gap: 16 — ABNORMAL HIGH (ref 5–15)
BUN: 28 mg/dL — ABNORMAL HIGH (ref 8–23)
CO2: 26 mmol/L (ref 22–32)
Calcium: 8 mg/dL — ABNORMAL LOW (ref 8.9–10.3)
Chloride: 96 mmol/L — ABNORMAL LOW (ref 98–111)
Creatinine, Ser: 2.23 mg/dL — ABNORMAL HIGH (ref 0.44–1.00)
GFR calc Af Amer: 23 mL/min — ABNORMAL LOW (ref >60)
GFR calc non Af Amer: 20 mL/min — ABNORMAL LOW (ref >60)
Glucose, Bld: 127 mg/dL — ABNORMAL HIGH (ref 70–99)
Potassium: 4 mmol/L (ref 3.5–5.1)
Sodium: 138 mmol/L (ref 135–145)
Total Bilirubin: 1.7 mg/dL — ABNORMAL HIGH (ref 0.3–1.2)
Total Protein: 6.8 g/dL (ref 6.5–8.1)

## 2019-05-24 LAB — SARS CORONAVIRUS 2 BY RT PCR (HOSPITAL ORDER, PERFORMED IN ~~LOC~~ HOSPITAL LAB): SARS Coronavirus 2: NEGATIVE

## 2019-05-24 LAB — LIPASE, BLOOD: Lipase: 57 U/L — ABNORMAL HIGH (ref 11–51)

## 2019-05-24 MED ORDER — ONDANSETRON 4 MG PO TBDP: 4.0000 mg | ORAL_TABLET | Freq: Four times a day (QID) | ORAL | 0 refills | Status: DC | PRN

## 2019-05-24 MED ORDER — DIPHENHYDRAMINE HCL 50 MG/ML IJ SOLN
12.5000 mg | Freq: Once | INTRAMUSCULAR | Status: AC
  Administered 2019-05-24: 12.5 mg via INTRAVENOUS
  Filled 2019-05-24: qty 1

## 2019-05-24 MED ORDER — SODIUM CHLORIDE 0.9% FLUSH
3.0000 mL | Freq: Once | INTRAVENOUS | Status: AC
  Administered 2019-05-24: 3 mL via INTRAVENOUS

## 2019-05-24 MED ORDER — PROCHLORPERAZINE EDISYLATE 10 MG/2ML IJ SOLN
5.0000 mg | Freq: Once | INTRAMUSCULAR | Status: AC
  Administered 2019-05-24: 5 mg via INTRAVENOUS
  Filled 2019-05-24: qty 2

## 2019-05-24 NOTE — Telephone Encounter (Signed)
Noted, thx.

## 2019-05-24 NOTE — ED Notes (Signed)
Pt unable to give urine sample at this time. Pt advised she will call out when able to go.

## 2019-05-24 NOTE — Telephone Encounter (Signed)
Patient's husband is calling to let Dr. Larose Kells know that the patient went back to the hosptial today by way of EMS. And that they will look at her head. As Dr. Larose Kells suggested. Thank you

## 2019-05-24 NOTE — ED Notes (Signed)
ED Provider at bedside. 

## 2019-05-24 NOTE — BH Assessment (Addendum)
Tele Assessment Note   Patient Name: Samantha Mckenzie MRN: [id number] Referring Physician: Maudie Flakes, MD Location of Patient: [facility] Location of Provider: [place] is an 83 y.o. female who presents to the ED voluntarily. Pt states she initially came to the ED due to seizures. Pt states she feels depressed and "wants Jesus to take me home." Pt denies SI/HI/ and AVH. Pt states over the past several months she has been having difficulty eating due to loss of appetite and multiple seizures. Pt states she has medical issues and she does not know why she continues to have seizures and wants the pain to end. Pt states she does not like feeling sick and told the EDP that she wants Jesus to take her but denies SI. Pt reports she lives with her husband. Pt states she feels safe returning home to her husband.  TTS consulted with Anette Riedel, NP who states pt does not meet inpt criteria. TTS attempted to advise EDP and RN but did not get an answer. Spoke with charge nurse [place], [place] who states she will inform the provider.  Diagnosis: Unspecified depressive d/o  Past Medical History:  Past Medical History:  Diagnosis Date  . Allergic rhinitis   . Anemia   . Breast CA ([place])    surgery, chemo, XRT; had peripheral neuropathy (imbalance at times) after chemo  . Cellulitis    Left arm, recurrent   . Cellulitis LEFT arm recurrent 12/08/2011  . Chronic renal disease, stage IV (HCC)    Dr. Moshe Cipro  . Depression   . Fatigue   . GERD (gastroesophageal reflux disease)    gastritis, EGD 02/2007  . Gout   . Hyperlipidemia   . Hypertension   . Macular degeneration 2011  . Normal cardiac stress test 12/2014  . Osteoarthritis   . Renal insufficiency     Past Surgical History:  Procedure Laterality Date  . cataracts bilaterally  1 -2012  . LUNG BIOPSY  1999   neg  . MASTECTOMY Left 1999   and lymphnodes     Family History:  Family History   Problem Relation Age of Onset  . Breast cancer Other        ? of   . Heart attack Father 42  . Diabetes Sister        2  . Hypertension Sister   . Colon cancer Neg Hx   . Stomach cancer Neg Hx     Social History:  reports that she has never smoked. She has never used smokeless tobacco. She reports previous alcohol use. She reports that she does not use drugs.  Additional Social History:  Alcohol / Drug Use Pain Medications: See MAR Prescriptions: See MAR Over the Counter: See MAR History of alcohol / drug use?: No history of alcohol / drug abuse  CIWA: CIWA-Ar BP: (!) 149/62 Pulse Rate: 80 COWS:    Allergies:  Allergies  Allergen Reactions  . Codeine Nausea And Vomiting  . Morphine Nausea And Vomiting  . Oxycodone-Acetaminophen Nausea And Vomiting  . Sulfonamide Derivatives Nausea And Vomiting  . Tramadol Hcl Nausea And Vomiting    Home Medications: (Not in a hospital admission)   OB/GYN Status:  No LMP recorded. Patient is postmenopausal.  General Assessment Data Location of Assessment: [facility] ED TTS Assessment: In system Is this a Tele or Face-to-Face Assessment?: Tele Assessment Is this an Initial Assessment or a Re-assessment for this encounter?: Initial Assessment Patient Accompanied  by:: N/A Language Other than English: No Living Arrangements: Other (Comment) What gender do you identify as?: Female Marital status: Married Pregnancy Status: No Living Arrangements: Spouse/significant other Can pt return to current living arrangement?: Yes Admission Status: Voluntary Is patient capable of signing voluntary admission?: Yes Referral Source: Self/Family/Friend Insurance type: [facility]     Crisis Care Plan Living Arrangements: Spouse/significant other Name of Psychiatrist: none Name of Therapist: none  Education Status Is patient currently in school?: No Is the patient employed, unemployed or receiving disability?: Receiving disability income(SSI)  Risk to  self with the past 6 months Suicidal Ideation: No Has patient been a risk to self within the past 6 months prior to admission? : No Suicidal Intent: No Has patient had any suicidal intent within the past 6 months prior to admission? : No Is patient at risk for suicide?: No Suicidal Plan?: No Has patient had any suicidal plan within the past 6 months prior to admission? : No Access to Means: No What has been your use of drugs/alcohol within the last 12 months?: denies use Previous Attempts/Gestures: No Triggers for Past Attempts: None known Intentional Self Injurious Behavior: None Family Suicide History: No Recent stressful life event(s): Recent negative physical changes Persecutory voices/beliefs?: No Depression: Yes Depression Symptoms: Despondent, Tearfulness, Feeling worthless/self pity Substance abuse history and/or treatment for substance abuse?: No Suicide prevention information given to non-admitted patients: Not applicable  Risk to Others within the past 6 months Homicidal Ideation: No Does patient have any lifetime risk of violence toward others beyond the six months prior to admission? : No Thoughts of Harm to Others: No Current Homicidal Intent: No Current Homicidal Plan: No Access to Homicidal Means: No History of harm to others?: No Assessment of Violence: None Noted Does patient have access to weapons?: No Criminal Charges Pending?: No Does patient have a court date: No Is patient on probation?: No  Psychosis Hallucinations: None noted Delusions: None noted  Mental Status Report Appearance/Hygiene: Unremarkable Eye Contact: Good Motor Activity: Freedom of movement Speech: Soft Level of Consciousness: Alert Mood: Depressed Affect: Appropriate to circumstance Anxiety Level: None Thought Processes: Coherent, Relevant Judgement: Partial Orientation: Person, Place, Time, Situation, Appropriate for developmental age Obsessive Compulsive Thoughts/Behaviors:  None  Cognitive Functioning Concentration: Normal Memory: Remote Intact, Recent Intact Is patient IDD: No Insight: Fair Impulse Control: Good Appetite: Poor Have you had any weight changes? : Loss Amount of the weight change? (lbs): 10 lbs Sleep: No Change Total Hours of Sleep: 8 Vegetative Symptoms: None  ADLScreening [facility] Assessment Services) Patient's cognitive ability adequate to safely complete daily activities?: Yes Patient able to express need for assistance with ADLs?: Yes Independently performs ADLs?: No  Prior Inpatient Therapy Prior Inpatient Therapy: No  Prior Outpatient Therapy Prior Outpatient Therapy: No Does patient have an ACCT team?: No Does patient have Intensive In-House Services?  : No Does patient have [facility] services? : No Does patient have P4CC services?: No  ADL Screening (condition at time of admission) Patient's cognitive ability adequate to safely complete daily activities?: Yes Is the patient deaf or have difficulty hearing?: Yes Does the patient have difficulty seeing, even when wearing glasses/contacts?: No Does the patient have difficulty concentrating, remembering, or making decisions?: No Patient able to express need for assistance with ADLs?: Yes Does the patient have difficulty dressing or bathing?: No Independently performs ADLs?: No Communication: Independent Dressing (OT): Independent Feeding: Independent Bathing: Independent Toileting: Independent In/Out Bed: Needs assistance Is this a change from baseline?: Pre-admission baseline Walks  in Home: Needs assistance Is this a change from baseline?: Pre-admission baseline Does the patient have difficulty walking or climbing stairs?: Yes Weakness of Legs: Both Weakness of Arms/Hands: None  Home Assistive Devices/Equipment Home Assistive Devices/Equipment: Environmental consultant (specify type)    Abuse/Neglect Assessment (Assessment to be complete while patient is alone) Abuse/Neglect  Assessment Can Be Completed: Yes Physical Abuse: Denies Verbal Abuse: Denies Sexual Abuse: Denies Exploitation of patient/patient's resources: Denies Self-Neglect: Denies     Regulatory affairs officer (For Healthcare) Does Patient Have a Medical Advance Directive?: Yes Type of Advance Directive: Living will, Healthcare Power of [place] in Chart?: No - copy requested Copy of Living Will in Chart?: No - copy requested          Disposition: TTS consulted with Anette Riedel, NP who states pt does not meet inpt criteria. TTS attempted to advise EDP and RN but did not get an answer. Spoke with charge nurse [place], [place] who states she will inform the provider.  Disposition Initial Assessment Completed for this Encounter: Yes  This service was provided via telemedicine using a 2-way, interactive audio and video technology.  Names of all persons participating in this telemedicine service and their role in this encounter. Name: VEIDA SPIRA Role: Patient  Name: Lind Covert Role: TTS          [name] 05/24/2019 8:51 PM

## 2019-05-24 NOTE — ED Provider Notes (Signed)
[facility] Emergency Department Provider Note MRN:  [id number]  Arrival date & time: 05/24/19     Chief Complaint   Near Syncope and Emesis   History of Present Illness   Samantha Mckenzie is a 83 y.o. year-old female with a history of breast cancer, GERD presenting to the ED with chief complaint of near syncope and emesis.  Patient has been having frequent severe headaches for 1 to 2 weeks, near syncopal episodes, frequent falls.  She denies headache or vision change, no abdominal pain, no leg pain or swelling, no dysuria.  She also wants to die, praying that Jesus will take her life.  She is endorsing depression.  Denies suicide attempt.  Denies hallucination.  Review of Systems  A complete 10 system review of systems was obtained and all systems are negative except as noted in the HPI and PMH.   Patient's Health History    Past Medical History:  Diagnosis Date  . Allergic rhinitis   . Anemia   . Breast CA ([place])    surgery, chemo, XRT; had peripheral neuropathy (imbalance at times) after chemo  . Cellulitis    Left arm, recurrent   . Cellulitis LEFT arm recurrent 12/08/2011  . Chronic renal disease, stage IV (HCC)    Dr. Moshe Cipro  . Depression   . Fatigue   . GERD (gastroesophageal reflux disease)    gastritis, EGD 02/2007  . Gout   . Hyperlipidemia   . Hypertension   . Macular degeneration 2011  . Normal cardiac stress test 12/2014  . Osteoarthritis   . Renal insufficiency     Past Surgical History:  Procedure Laterality Date  . cataracts bilaterally  1 -2012  . LUNG BIOPSY  1999   neg  . MASTECTOMY Left 1999   and lymphnodes     Family History  Problem Relation Age of Onset  . Breast cancer Other        ? of   . Heart attack Father 35  . Diabetes Sister        2  . Hypertension Sister   . Colon cancer Neg Hx   . Stomach cancer Neg Hx     Social History   Socioeconomic History  . Marital status: Married    Spouse name: Not on file   . Number of children: 2  . Years of education: Not on file  . Highest education level: Not on file  Occupational History  . Occupation: retired     Fish farm manager: RETIRED  Social Needs  . Financial resource strain: Not on file  . Food insecurity    Worry: Not on file    Inability: Not on file  . Transportation needs    Medical: Not on file    Non-medical: Not on file  Tobacco Use  . Smoking status: Never Smoker  . Smokeless tobacco: Never Used  Substance and Sexual Activity  . Alcohol use: Not Currently    Alcohol/week: 0.0 standard drinks    Comment: rarely  . Drug use: No  . Sexual activity: Not Currently  Lifestyle  . Physical activity    Days per week: Not on file    Minutes per session: Not on file  . Stress: Not on file  Relationships  . Social Herbalist on phone: Not on file    Gets together: Not on file    Attends religious service: Not on file    Active member of club or  organization: Not on file    Attends meetings of clubs or organizations: Not on file    Relationship status: Not on file  . Intimate partner violence    Fear of current or ex partner: Not on file    Emotionally abused: Not on file    Physically abused: Not on file    Forced sexual activity: Not on file  Other Topics Concern  . Not on file  Social History Narrative   Lives w/ husband , 2 children in [place], [place]             Physical Exam  Vital Signs and Nursing Notes reviewed Vitals:   05/24/19 1052 05/24/19 1554  BP: (!) 127/54 (!) 143/60  Pulse: 86 78  Resp: 16 12  Temp: 98.3 F (36.8 C)   SpO2: 100% 98%    CONSTITUTIONAL: Well-appearing, NAD NEURO:  Alert and oriented x 3, normal and symmetric strength and sensation, normal speech, normal coordination. EYES:  eyes equal and reactive ENT/NECK:  no LAD, no JVD CARDIO: Regular rate, well-perfused, normal S1 and S2 PULM:  CTAB no wheezing or rhonchi GI/GU:  normal bowel sounds, non-distended, non-tender MSK/SPINE:  No  gross deformities, no edema SKIN:  no rash, atraumatic PSYCH: Depressed speech and behavior  Diagnostic and Interventional Summary    EKG Interpretation  Date/Time:  Thursday May 24 2019 10:51:54 EDT Ventricular Rate:  88 PR Interval:  148 QRS Duration: 84 QT Interval:  384 QTC Calculation: 464 R Axis:   55 Text Interpretation:  Normal sinus rhythm T wave abnormality, consider inferior ischemia Abnormal ECG Confirmed by Gerlene Fee [phone]) on 05/24/2019 12:05:17 PM      Labs Reviewed  CBC - Abnormal; Notable for the following components:      Result Value   WBC 10.8 (*)    RBC 2.98 (*)    Hemoglobin 9.1 (*)    HCT 27.7 (*)    RDW 20.8 (*)    Platelets 548 (*)    All other components within normal limits  LIPASE, BLOOD - Abnormal; Notable for the following components:   Lipase 57 (*)    All other components within normal limits  COMPREHENSIVE METABOLIC PANEL - Abnormal; Notable for the following components:   Chloride 96 (*)    Glucose, Bld 127 (*)    BUN 28 (*)    Creatinine, Ser 2.23 (*)    Calcium 8.0 (*)    Albumin 2.0 (*)    Total Bilirubin 1.7 (*)    GFR calc non Af Amer 20 (*)    GFR calc Af Amer 23 (*)    Anion gap 16 (*)    All other components within normal limits  SARS CORONAVIRUS 2 (HOSPITAL ORDER, [place] [facility])  URINALYSIS, ROUTINE W REFLEX MICROSCOPIC  TSH    CT Head Wo Contrast  Final Result      Medications  sodium chloride flush (NS) 0.9 % injection 3 mL (3 mLs Intravenous Given 05/24/19 1408)  diphenhydrAMINE (BENADRYL) injection 12.5 mg (12.5 mg Intravenous Given 05/24/19 1405)  prochlorperazine (COMPAZINE) injection 5 mg (5 mg Intravenous Given 05/24/19 1407)     Procedures Critical Care  ED Course and Medical Decision Making  I have reviewed the triage vital signs and the nursing notes.  Pertinent labs & imaging results that were available during my care of the patient were reviewed by me and considered in  my medical decision making (see below for details).  Favoring  depression as the major contributor to patient's symptoms, however given the continued worsening headaches will repeat CT head, obtain basic labs, urinalysis to evaluate for metabolic disarray, UTI, other causes of patient's symptoms.  Clinical Course as of May 23 1836  Thu May 24, 2019  1254 Spoke with patient's spouse who confirms significant behavioral and personality change over the past 1 to 2 months.  According to documentation, there is concern for frontotemporal dementia by PCP.  I will consider MRI imaging after CT head in order to fully medically clear this patient prior to TTS evaluation.   [MB]    Clinical Course User Index [MB] Maudie Flakes, MD    CT head is unremarkable.  Patient continues to have a normal neurological exam.  There is no indication for emergent MRI imaging.  She is appropriate for TTS evaluation, is medically cleared.  Anticipating inpatient needs given concern for major depressive disorder.  Signed out to default provider.  Barth Kirks. Sedonia Small, [place] [email or web address]  Final Clinical Impressions(s) / ED Diagnoses     ICD-10-CM   1. Nonintractable headache, unspecified chronicity pattern, unspecified headache type  R51   2. Current severe episode of major depressive disorder without psychotic features, unspecified whether recurrent [facility])  F32.2     ED Discharge Orders    None         Maudie Flakes, MD 05/24/19 [phone]

## 2019-05-24 NOTE — ED Provider Notes (Signed)
  Physical Exam  BP (!) 149/62   Pulse 80   Temp 98.3 F (36.8 C) (Oral)   Resp 10   Ht 5' 3"  (1.6 m)   Wt 59 kg   SpO2 98%   BMI 23.03 kg/m   Physical Exam  ED Course/Procedures   Clinical Course as of May 23 2110  Thu May 24, 2019  1254 Spoke with patient's spouse who confirms significant behavioral and personality change over the past 1 to 2 months.  According to documentation, there is concern for frontotemporal dementia by PCP.  I will consider MRI imaging after CT head in order to fully medically clear this patient prior to TTS evaluation.   [MB]    Clinical Course User Index [MB] Maudie Flakes, MD    Procedures  MDM  Patient was medically cleared earlier.  Patient has some anxiety about going to nursing home And exhibits some depression symptoms.  Patient had vomiting and an unremarkable CT head and labs are baseline.  Psychiatry saw the patient and recommend discharge .  I again offered her to go to a nursing home and she is adamant that she wants to go home .  No vomiting observed in the ED .  I ordered home health and will have case management follow-up.      Drenda Freeze, MD 05/24/19 2112

## 2019-05-24 NOTE — Discharge Instructions (Addendum)
Continue your current meds  Take zofran as needed for nausea   See your doctor   Psychiatry saw you today and felt that you do not need to go to a psych facility   Home health will contact you tomorrow   Return to ER if you have worse headaches, vomiting, thoughts to harm yourself or others, hallucinations

## 2019-05-24 NOTE — Telephone Encounter (Signed)
Contacted patient's husband this am, [name], to arrange a home visit and he advised that he had to call EMS today and patient is currently at [facility] ED for evaluation.

## 2019-05-24 NOTE — Patient Outreach (Signed)
[facility]) Care Management  05/24/2019  Samantha Mckenzie [birth year] [id number]    Transition of care (2nd attempt)  RN attempted to contact pt however informed pt is "back in the hospital". RN will attempted to follow up once pt is discharged.  [name], RN Care Management Coordinator [place] Office [phone]

## 2019-05-24 NOTE — ED Triage Notes (Signed)
Arrived via EMS from home. EMS reported patient nausea and emesis today after laying on floor due to having a headache. CBG 127. EMS administered Zofran 48m IVP. Patient alert answering and following commands appropriate. Patient states she possibly passed out and states headache 3/10 achy and feels generally tired.

## 2019-05-25 ENCOUNTER — Telehealth: Payer: Self-pay | Admitting: *Deleted

## 2019-05-25 DIAGNOSIS — R110 Nausea: Secondary | ICD-10-CM | POA: Diagnosis not present

## 2019-05-25 DIAGNOSIS — I959 Hypotension, unspecified: Secondary | ICD-10-CM | POA: Diagnosis not present

## 2019-05-25 DIAGNOSIS — M2550 Pain in unspecified joint: Secondary | ICD-10-CM | POA: Diagnosis not present

## 2019-05-25 DIAGNOSIS — Z7401 Bed confinement status: Secondary | ICD-10-CM | POA: Diagnosis not present

## 2019-05-25 DIAGNOSIS — F29 Unspecified psychosis not due to a substance or known physiological condition: Secondary | ICD-10-CM | POA: Diagnosis not present

## 2019-05-25 NOTE — Telephone Encounter (Signed)
Ok to all

## 2019-05-25 NOTE — ED Notes (Signed)
[facility] transporting pt home at this time.  There are no further needs.

## 2019-05-25 NOTE — Telephone Encounter (Signed)
Dr Larose Kells:  Please advise if ok to proceed with below orders?  Copied from [place] [id number]. Topic: Quick Communication - Home Health Verbal Orders >> May 25, 2019  9:13 AM Virl Axe D wrote: Caller/Agency: [facility] Number: [phone] or ask for [employer] Requesting OT/PT/Skilled Nursing/Social Work/Speech Therapy: PT/Nursing/Social Work  Frequency: Evaluations for PT/Nursing/Social Work

## 2019-05-25 NOTE — Telephone Encounter (Signed)
Verbal auth given to Candace at below #.

## 2019-05-25 NOTE — ED Notes (Signed)
Called [facility] for transport home--[name]

## 2019-05-25 NOTE — TOC Initial Note (Signed)
Transition of Care [facility]) - Initial/Assessment Note    Patient Details  Name: Samantha Mckenzie MRN: [id number] Date of Birth: 15-Nov-1935  Transition of Care [facility]) CM/SW Contact:    Erenest Rasher, RN Phone Number: [phone] 05/25/2019, 12:17 PM  Clinical Narrative:                 Spoke to pt's husband. Husband states pt is not herself and has been declining in her health. States he is thankful for some help at home. States he has to call EMS to assist with getting her up at times because she is not as mobile.  Offered choice for [facility]. Agreeable to [facility] for [facility]. Contacted [facility] for [employer]. Waiting for confirmation of acceptance of referral.   Expected Discharge Plan: [place] Barriers to Discharge: No Barriers Identified   Patient Goals and CMS Choice Patient states their goals for this hospitalization and ongoing recovery are:: husband feels she is declining CMS Medicare.gov Compare Post Acute Care list provided to:: Patient Represenative (must comment) Choice offered to / list presented to : Spouse  Expected Discharge Plan and Services Expected Discharge Plan: [place]   Discharge Planning Services: CM Consult Post Acute Care Choice: [place] arrangements for the past 2 months: Single Family Home                           HH Arranged: RN, PT, OT, Nurse's Aide, Social Work [employer] Agency: [place] Date [facility] Agency Contacted: 05/25/19 Time [place]: 1215 Representative spoke with at [place]: Adela Lank  Prior Living Arrangements/Services Living arrangements for the past 2 months: [place] with:: Spouse Patient language and need for interpreter reviewed:: Yes Do you feel safe going back to the place where you live?: Yes      Need for Family Participation in Patient Care: Yes (Comment) Care giver support system in place?: Yes (comment)   Criminal  Activity/Legal Involvement Pertinent to Current Situation/Hospitalization: No - Comment as needed  Activities of Daily Living Home Assistive Devices/Equipment: Walker (specify type) ADL Screening (condition at time of admission) Patient's cognitive ability adequate to safely complete daily activities?: Yes Is the patient deaf or have difficulty hearing?: Yes Does the patient have difficulty seeing, even when wearing glasses/contacts?: No Does the patient have difficulty concentrating, remembering, or making decisions?: No Patient able to express need for assistance with ADLs?: Yes Does the patient have difficulty dressing or bathing?: No Independently performs ADLs?: No Communication: Independent Dressing (OT): Independent Feeding: Independent Bathing: Independent Toileting: Independent In/Out Bed: Needs assistance Is this a change from baseline?: Pre-admission baseline Walks in Home: Needs assistance Is this a change from baseline?: Pre-admission baseline Does the patient have difficulty walking or climbing stairs?: Yes Weakness of Legs: Both Weakness of Arms/Hands: None  Permission Sought/Granted Permission sought to share information with : Case Manager, Family Supports, Other (comment) Permission granted to share information with : Yes, Verbal Permission Granted  Share Information with NAME: Chrisa Hassan  Permission granted to share info w AGENCY: [place] granted to share info w Relationship: husband  Permission granted to share info w Contact Information: [phone]  Emotional Assessment           Psych Involvement: No (comment)  Admission diagnosis:  n/v Patient Active Problem List   Diagnosis Date Noted  .  Ileitis, terminal ([place]) 04/22/2019  . COPD (chronic obstructive pulmonary disease) ([place]) 04/12/2019  . Enteritis 04/07/2019  . Chronic diastolic CHF (congestive heart failure) ([place]) 04/07/2019  . Hypokalemia 04/07/2019  . Hypocalcemia  04/07/2019  . Fall 04/07/2019  . Occult blood positive stool 04/07/2019  . Adjustment disorder with depressed mood   . Generalized abdominal pain   . Dysphasia 03/21/2019  . CAP (community acquired pneumonia) 03/20/2019  . Nausea vomiting and diarrhea 03/20/2019  . Acute respiratory failure with hypoxia ([place]) 03/20/2019  . Acute renal failure superimposed on stage 4 chronic kidney disease ([place]) 03/20/2019  . Lactic acidosis 03/20/2019  . Diabetic peripheral neuropathy associated with type 2 diabetes mellitus ([place]) 03/20/2019  . Hypomagnesemia 06/21/2018  . Generalized weakness 06/20/2018  . PCP NOTES >>> 07/11/2015  . Depression   . VITAMIN B12 DEFICIENCY 04/01/2008  . GERD 12/19/2007  . NEOP, MALIGNANT, FEMALE BREAST NOS 07/07/2007  . Osteoarthritis 07/07/2007  . Diabetes mellitus type 2 with complications ([place]) 68/05/8109  . Hyperlipidemia 02/28/2007  . GOUT 02/28/2007  . Normocytic anemia 02/28/2007  . Essential hypertension 02/28/2007   PCP:  Colon Branch, MD Pharmacy:   [place], [place] [address] [place] [address] Phone: [phone] Fax: [place] [address], [place]. [place]. [place] [address] Phone: [phone] Fax: [phone]     Social Determinants of Health (SDOH) Interventions    Readmission Risk Interventions Readmission Risk Prevention Plan 04/10/2019 03/26/2019  Transportation Screening Complete Complete  PCP or Specialist Appt within 3-5 Days - Complete  HRI or [place] - Complete  Social Work Consult for [facility] Planning/Counseling - Complete  Palliative Care Screening - Not Applicable  Medication Review Press photographer) Referral to Pharmacy Complete  PCP or Specialist appointment within 3-5 days of discharge Complete -  [place] or Home Care Consult Complete -  SW Recovery Care/Counseling Consult  Complete -  Palliative Care Screening Not Applicable -  [place] Not Applicable -  Some recent data might be hidden

## 2019-05-28 ENCOUNTER — Other Ambulatory Visit: Payer: Self-pay | Admitting: *Deleted

## 2019-05-28 ENCOUNTER — Telehealth: Payer: Self-pay

## 2019-05-28 ENCOUNTER — Telehealth: Payer: Self-pay | Admitting: Internal Medicine

## 2019-05-28 NOTE — Telephone Encounter (Signed)
See [facility] telephone notes.

## 2019-05-28 NOTE — Telephone Encounter (Signed)
Spoke w/ Burman Blacksmith orders given.

## 2019-05-28 NOTE — Telephone Encounter (Signed)
Advise patient husband, no new recommendations, recommend to take medications as prescribed.

## 2019-05-28 NOTE — Telephone Encounter (Signed)
Caller: Tharon Aquas [facility]  Requesting PT  Frequency: 2w2, 1w7   VM ok

## 2019-05-28 NOTE — Telephone Encounter (Signed)
Copied from [place] [id number]. Topic: General - Inquiry >> May 28, 2019 11:02 AM Virl Axe D wrote: Reason for CRM:Pt's husband [name] would like for Dr. Ethel Rana assistant to give him a call regarding pt's current stated of health. Please advise [phone]

## 2019-05-28 NOTE — Patient Outreach (Signed)
[facility]) Care Management  05/28/2019  [name] 08-Feb-1936 [id number]   Care Coordination-Dr. Larose Kells  RN received a call back from Dr. Larose Kells with much discussion concerning pt's issues and possible goals of care. Discussed pt's history as Dr. Larose Kells indicated pt has had two SNF placements as one denied entry and the other pt left prior to discharge. Dr. Larose Kells as indicated no psychological barriers as pt is competent with no dementia. Therefore pt can leave any facility when she likes. RN has informed Dr. Larose Kells that RN is awaiting a call back from the son concerning any needs that [facility] may assist with at this time for ongoing self management of care with the involved caregivers. Dr. Larose Kells has inquired if [facility] can assist with a psych RN visiting for this pt. RN informed Dr. Larose Kells that [facility] does not have a psych RN and he may need to consult a psychiatrist for possible options related to a psych nurse visitation.   Plan: RN will further discuss possible option when the son [name] returns a call to RN case manager for goals of care and/or possible enrollment into the [facility] program and services. Will introduce CSW and pharmacy services also with any community resources that may assist the caregivers in managing the pt's care. If placement is considered Dr. Larose Kells is aware of the need for a FL2 if pt willing to pursue this option once again. RN will continue to update the team accordingly.  [name], RN Care Management Coordinator [place] Office [phone]

## 2019-05-28 NOTE — Patient Outreach (Signed)
[facility]) Care Management  05/28/2019  Samantha Mckenzie February 03, 1936 [id number]    Referral Received 05/21/2019 3rd outreach 05/28/2019 Primary care to completed the Transition of care  Telephone Assessment-pending [facility] services  RN spoke with pt today and received permission to speak with the following concerning her ongoing medical issues and care (spouse-Samantha Mckenzie, friend-Samantha Mckenzie and her son Samantha Mckenzie). RN introduced the [facility] program and purpose for today's call concerning her recent SNF discharged and ED visits. Samantha Mckenzie indicates the pt's son has requested her to visit today due to his work schedule. Samantha Mckenzie was able to review pt's medications with several medications not administered by the spouse. Several issues addressed with pt's not feeling well today however friend indicates pt is not make in facial grimace concerning pain or discomfort. Husband in the background indicating he is exhausted. States pt remains weak. States husband has been preparing the meals and taking care of the pt since her discharge. Friend agrees pt may need more assistance. Friend states the pt's son has been trying to arrange care for the pt however not sure exactly what kind of assistance. RN requested to contact the son Samantha Mckenzie and inquired further.   RN contacted Samantha Mckenzie (son) and reintroduced [facility] and purpose for today's call. Son was able to verify pt's name and DOB however he is on the job and this was not a good time to talk. Son provider RN case manager's contact and work hours. Son requested to call RN case manager back when he is free from the job.  Will also reach out to pt's provider to verify transition of care and further discuss pt's risk of readmission due to lack of support with medication administration and possible LOC issues with numerous ED visits and ongoing weakness. RN spoke with Dr. Larose Kells office concerning pt's recent SNF and ED discharged for transition of care. Will also  request a call back from the nurse concerning a plan of care related to LOC for possible assistance. Currently pending a call back from both the son and the provider's office for goals of care.  Patient was recently discharged from hospital and all medications have been reviewed.  Samantha Mina, RN Care Management Coordinator [place] Office [phone]

## 2019-05-28 NOTE — Telephone Encounter (Signed)
Copied from [place] [id number]. Topic: General - Other >> May 28, 2019 12:10 PM Parke Poisson wrote: Reason for CRM: [name] from [employer]  called wanting to know if you will be taking over transitions of care.Pt has been to ED 4 to 5 times last several months. Her call back # [phone]

## 2019-05-28 NOTE — Telephone Encounter (Signed)
Spoke with [name], gave her summary of recent events. Conversation with the patient's son is pending.

## 2019-05-29 ENCOUNTER — Other Ambulatory Visit: Payer: Self-pay | Admitting: *Deleted

## 2019-05-29 ENCOUNTER — Telehealth: Payer: Self-pay | Admitting: Internal Medicine

## 2019-05-29 NOTE — Patient Outreach (Addendum)
[facility]) Care Management  05/29/2019  Samantha Mckenzie 04-19-1936 [id number]    Care Coordination-Declined enrollment into [facility] services at this time  Pt provided permission on conversation yesterday to speak with her son. RN returned a call to pt's son from a message left. RN introduced [facility] services and the purpose for the call made to the pt. Son receptive and further discussed the needs of the pt. Based upon the discussion RN case manager had with the provider (Dr. Larose Kells) son has indicated the pt needs "help". Pt has had several ED visits over the last month related to weakness, HA's and GI issues. Discussed the current needs and problems. States pt has been placed pt at a SNF for the care she needs but this has not been successful. Pt has been admitted to two different SNF over the past month and left on her own prior to the discharges. Pt refused placement on the last ED visit 8/13 however receptive to [facility]. Son states these services have started with a pending PT visit tomorrow. Other topics discussed related to the following:  MEDICATIONS- Based upon RN's call to pt yesterday. Medications were in different places in the home and it was indicated that the spouse only gives what's in the bag to the pt which may not be all of the prescribed medications. Son has requested assistance. RN offered pharmacy consult and for someone to be available to continue the process for refilling the pt's pill box or medication distribution box. Other options discussed related to local pharmacies for bubble packets on all her medications however may incur a fee due to pt's insurance currently with [employer].  Son aware [facility] only offers short term case management services and does not offer such services long term. Son has indicated pt has pill boxes but some medication were on the floor and he could not verify pt receives all her medications. [facility] will [facility] if the same services offered  for the husband (VET) could be offered to the spouse of the VET. States the agency providers refills packet to the pt's spouse at the [place].  HTN/CHF-Son states the pt will not monitoring either of these medical conditions. Son described pt before and after with her admission back in June. Pt will not weight or take her BP on a regular bases.   Son indicated pt has had a CT scan that was negative. On the last ED visit 8/3 pt was cleared via psychiatrist who recommended discharge. Dr. Larose Kells has also ordered a  Psych consult that is scheduled on 9/25. RN discussed LOC with memory units verses LTC facilities however pt would need to be evaluated by the facilities of choice and fall under there criterias for admission.  Offered to assist at that time with [facility] however this would need Dr. Larose Kells or one of her providers to completed.   Son states pt was very active walker daily and managing her ADL with no acute issues. After her admission in June pt started to decline. States pt will not shower, brush her teeth and stays in bed most of the time mourning. All the help provider and initiated has been declined by pt for any assistance both at the hospital and in the home.   RN offered Social Work and Pharmacy consult however this was declined based upon the limited resources that pt will allow to services her needs however the son is aware these services are always available based upon eligibility. Son  with full understanding of [facility] services and very grateful for the resources and information offered. Due to pt's decline to participate in managing her care or lack of willingness to participate in the [facility] program. Son has requested a packet via [facility] information be sent to his address directly as the primary caregiver. RN also provided a direct contact number for this RN case manager if needed for any other resources in the community.  Affaid the packet sent to the pt's home will not be found when needed. RN will also updated  Dr. Larose Kells of the pt's disposition with [facility] services at this time.  Case will be closed and Amber Chrismon assigned social worker will be notified of pt's decline for all services with [facility].  [name], RN Care Management Coordinator [place] Office [phone]

## 2019-05-29 NOTE — Telephone Encounter (Signed)
FYI

## 2019-05-29 NOTE — Telephone Encounter (Signed)
Donita, RN with [facility], called in to inform PCP that patient reported to her during home visit that she has been having diarrhea that is bright red.RN does not have an accurate time line of when this started.  Ms.Donita also called in for the possibility of getting an order for palliative care from PCP. Stated during her visit with patient, patient expressed that she is miserable and just wants to die. Please advise. Call back is [phone].

## 2019-05-29 NOTE — Telephone Encounter (Signed)
Spoke with Environmental manager, she saw the patient earlier today, the patient reported bright red the stools, the granddaughter and the husband who were there could not confirm that. She recommended the granddaughter to watch that carefully and if that happen again to seek medical attention. She is also recommending palliative care, I totally agree. She felt the patient was competent to make her decisions.

## 2019-05-30 ENCOUNTER — Other Ambulatory Visit: Payer: Self-pay

## 2019-05-30 ENCOUNTER — Encounter (HOSPITAL_COMMUNITY): Payer: Self-pay | Admitting: Emergency Medicine

## 2019-05-30 ENCOUNTER — Emergency Department (HOSPITAL_COMMUNITY): Payer: Medicare Other

## 2019-05-30 ENCOUNTER — Other Ambulatory Visit: Payer: Self-pay | Admitting: *Deleted

## 2019-05-30 ENCOUNTER — Inpatient Hospital Stay (HOSPITAL_COMMUNITY)
Admission: EM | Admit: 2019-05-30 | Discharge: 2019-06-09 | DRG: 871 | Disposition: A | Discharge status: Skilled Nursing Facility | Payer: Medicare Other | Attending: Family Medicine | Admitting: Family Medicine

## 2019-05-30 DIAGNOSIS — G3109 Other frontotemporal dementia: Secondary | ICD-10-CM | POA: Diagnosis present

## 2019-05-30 DIAGNOSIS — K5730 Diverticulosis of large intestine without perforation or abscess without bleeding: Secondary | ICD-10-CM | POA: Diagnosis present

## 2019-05-30 DIAGNOSIS — Z20828 Contact with and (suspected) exposure to other viral communicable diseases: Secondary | ICD-10-CM | POA: Diagnosis present

## 2019-05-30 DIAGNOSIS — R700 Elevated erythrocyte sedimentation rate: Secondary | ICD-10-CM | POA: Diagnosis not present

## 2019-05-30 DIAGNOSIS — Z9181 History of falling: Secondary | ICD-10-CM

## 2019-05-30 DIAGNOSIS — E872 Acidosis, unspecified: Secondary | ICD-10-CM | POA: Diagnosis present

## 2019-05-30 DIAGNOSIS — R933 Abnormal findings on diagnostic imaging of other parts of digestive tract: Secondary | ICD-10-CM

## 2019-05-30 DIAGNOSIS — J984 Other disorders of lung: Secondary | ICD-10-CM | POA: Diagnosis not present

## 2019-05-30 DIAGNOSIS — A419 Sepsis, unspecified organism: Secondary | ICD-10-CM | POA: Diagnosis not present

## 2019-05-30 DIAGNOSIS — R9431 Abnormal electrocardiogram [ECG] [EKG]: Secondary | ICD-10-CM | POA: Diagnosis present

## 2019-05-30 DIAGNOSIS — E860 Dehydration: Secondary | ICD-10-CM

## 2019-05-30 DIAGNOSIS — R2681 Unsteadiness on feet: Secondary | ICD-10-CM | POA: Diagnosis present

## 2019-05-30 DIAGNOSIS — R627 Adult failure to thrive: Secondary | ICD-10-CM | POA: Diagnosis present

## 2019-05-30 DIAGNOSIS — R1031 Right lower quadrant pain: Secondary | ICD-10-CM | POA: Diagnosis not present

## 2019-05-30 DIAGNOSIS — K50012 Crohn's disease of small intestine with intestinal obstruction: Secondary | ICD-10-CM | POA: Diagnosis present

## 2019-05-30 DIAGNOSIS — R41841 Cognitive communication deficit: Secondary | ICD-10-CM | POA: Diagnosis not present

## 2019-05-30 DIAGNOSIS — D539 Nutritional anemia, unspecified: Secondary | ICD-10-CM | POA: Diagnosis present

## 2019-05-30 DIAGNOSIS — I5032 Chronic diastolic (congestive) heart failure: Secondary | ICD-10-CM | POA: Diagnosis present

## 2019-05-30 DIAGNOSIS — J449 Chronic obstructive pulmonary disease, unspecified: Secondary | ICD-10-CM | POA: Diagnosis present

## 2019-05-30 DIAGNOSIS — K219 Gastro-esophageal reflux disease without esophagitis: Secondary | ICD-10-CM | POA: Diagnosis not present

## 2019-05-30 DIAGNOSIS — S0003XA Contusion of scalp, initial encounter: Secondary | ICD-10-CM | POA: Diagnosis not present

## 2019-05-30 DIAGNOSIS — R000 Tachycardia, unspecified: Secondary | ICD-10-CM | POA: Diagnosis not present

## 2019-05-30 DIAGNOSIS — F329 Major depressive disorder, single episode, unspecified: Secondary | ICD-10-CM | POA: Diagnosis present

## 2019-05-30 DIAGNOSIS — K50019 Crohn's disease of small intestine with unspecified complications: Secondary | ICD-10-CM | POA: Diagnosis not present

## 2019-05-30 DIAGNOSIS — M50322 Other cervical disc degeneration at C5-C6 level: Secondary | ICD-10-CM | POA: Diagnosis not present

## 2019-05-30 DIAGNOSIS — I130 Hypertensive heart and chronic kidney disease with heart failure and stage 1 through stage 4 chronic kidney disease, or unspecified chronic kidney disease: Secondary | ICD-10-CM | POA: Diagnosis present

## 2019-05-30 DIAGNOSIS — M47812 Spondylosis without myelopathy or radiculopathy, cervical region: Secondary | ICD-10-CM | POA: Diagnosis not present

## 2019-05-30 DIAGNOSIS — K6389 Other specified diseases of intestine: Secondary | ICD-10-CM | POA: Diagnosis not present

## 2019-05-30 DIAGNOSIS — Z882 Allergy status to sulfonamides status: Secondary | ICD-10-CM

## 2019-05-30 DIAGNOSIS — R531 Weakness: Secondary | ICD-10-CM | POA: Diagnosis not present

## 2019-05-30 DIAGNOSIS — I6523 Occlusion and stenosis of bilateral carotid arteries: Secondary | ICD-10-CM | POA: Diagnosis not present

## 2019-05-30 DIAGNOSIS — M4802 Spinal stenosis, cervical region: Secondary | ICD-10-CM | POA: Diagnosis not present

## 2019-05-30 DIAGNOSIS — E1122 Type 2 diabetes mellitus with diabetic chronic kidney disease: Secondary | ICD-10-CM | POA: Diagnosis present

## 2019-05-30 DIAGNOSIS — I69828 Other speech and language deficits following other cerebrovascular disease: Secondary | ICD-10-CM | POA: Diagnosis not present

## 2019-05-30 DIAGNOSIS — E1142 Type 2 diabetes mellitus with diabetic polyneuropathy: Secondary | ICD-10-CM | POA: Diagnosis present

## 2019-05-30 DIAGNOSIS — R194 Change in bowel habit: Secondary | ICD-10-CM | POA: Diagnosis not present

## 2019-05-30 DIAGNOSIS — Z803 Family history of malignant neoplasm of breast: Secondary | ICD-10-CM

## 2019-05-30 DIAGNOSIS — M50321 Other cervical disc degeneration at C4-C5 level: Secondary | ICD-10-CM | POA: Diagnosis not present

## 2019-05-30 DIAGNOSIS — N179 Acute kidney failure, unspecified: Secondary | ICD-10-CM | POA: Diagnosis present

## 2019-05-30 DIAGNOSIS — E876 Hypokalemia: Secondary | ICD-10-CM | POA: Diagnosis present

## 2019-05-30 DIAGNOSIS — E43 Unspecified severe protein-calorie malnutrition: Secondary | ICD-10-CM | POA: Diagnosis present

## 2019-05-30 DIAGNOSIS — R404 Transient alteration of awareness: Secondary | ICD-10-CM | POA: Diagnosis not present

## 2019-05-30 DIAGNOSIS — M109 Gout, unspecified: Secondary | ICD-10-CM | POA: Diagnosis present

## 2019-05-30 DIAGNOSIS — Z9012 Acquired absence of left breast and nipple: Secondary | ICD-10-CM

## 2019-05-30 DIAGNOSIS — A09 Infectious gastroenteritis and colitis, unspecified: Secondary | ICD-10-CM | POA: Diagnosis present

## 2019-05-30 DIAGNOSIS — Z79899 Other long term (current) drug therapy: Secondary | ICD-10-CM

## 2019-05-30 DIAGNOSIS — R45851 Suicidal ideations: Secondary | ICD-10-CM | POA: Diagnosis not present

## 2019-05-30 DIAGNOSIS — K56699 Other intestinal obstruction unspecified as to partial versus complete obstruction: Secondary | ICD-10-CM | POA: Diagnosis not present

## 2019-05-30 DIAGNOSIS — E785 Hyperlipidemia, unspecified: Secondary | ICD-10-CM | POA: Diagnosis present

## 2019-05-30 DIAGNOSIS — K50011 Crohn's disease of small intestine with rectal bleeding: Secondary | ICD-10-CM | POA: Diagnosis present

## 2019-05-30 DIAGNOSIS — Z7982 Long term (current) use of aspirin: Secondary | ICD-10-CM

## 2019-05-30 DIAGNOSIS — N3289 Other specified disorders of bladder: Secondary | ICD-10-CM | POA: Diagnosis not present

## 2019-05-30 DIAGNOSIS — K5000 Crohn's disease of small intestine without complications: Secondary | ICD-10-CM | POA: Diagnosis not present

## 2019-05-30 DIAGNOSIS — J8410 Pulmonary fibrosis, unspecified: Secondary | ICD-10-CM | POA: Diagnosis not present

## 2019-05-30 DIAGNOSIS — M2550 Pain in unspecified joint: Secondary | ICD-10-CM | POA: Diagnosis not present

## 2019-05-30 DIAGNOSIS — D62 Acute posthemorrhagic anemia: Secondary | ICD-10-CM

## 2019-05-30 DIAGNOSIS — K529 Noninfective gastroenteritis and colitis, unspecified: Secondary | ICD-10-CM | POA: Diagnosis present

## 2019-05-30 DIAGNOSIS — K5790 Diverticulosis of intestine, part unspecified, without perforation or abscess without bleeding: Secondary | ICD-10-CM | POA: Diagnosis not present

## 2019-05-30 DIAGNOSIS — R4182 Altered mental status, unspecified: Secondary | ICD-10-CM | POA: Diagnosis not present

## 2019-05-30 DIAGNOSIS — R748 Abnormal levels of other serum enzymes: Secondary | ICD-10-CM | POA: Diagnosis present

## 2019-05-30 DIAGNOSIS — I10 Essential (primary) hypertension: Secondary | ICD-10-CM | POA: Diagnosis present

## 2019-05-30 DIAGNOSIS — Z8249 Family history of ischemic heart disease and other diseases of the circulatory system: Secondary | ICD-10-CM

## 2019-05-30 DIAGNOSIS — Z833 Family history of diabetes mellitus: Secondary | ICD-10-CM

## 2019-05-30 DIAGNOSIS — R197 Diarrhea, unspecified: Secondary | ICD-10-CM | POA: Diagnosis not present

## 2019-05-30 DIAGNOSIS — I959 Hypotension, unspecified: Secondary | ICD-10-CM | POA: Diagnosis not present

## 2019-05-30 DIAGNOSIS — F32A Depression, unspecified: Secondary | ICD-10-CM

## 2019-05-30 DIAGNOSIS — M4316 Spondylolisthesis, lumbar region: Secondary | ICD-10-CM | POA: Diagnosis not present

## 2019-05-30 DIAGNOSIS — H3530 Unspecified macular degeneration: Secondary | ICD-10-CM | POA: Diagnosis present

## 2019-05-30 DIAGNOSIS — R296 Repeated falls: Secondary | ICD-10-CM | POA: Diagnosis present

## 2019-05-30 DIAGNOSIS — D631 Anemia in chronic kidney disease: Secondary | ICD-10-CM | POA: Diagnosis present

## 2019-05-30 DIAGNOSIS — M6281 Muscle weakness (generalized): Secondary | ICD-10-CM | POA: Diagnosis not present

## 2019-05-30 DIAGNOSIS — R609 Edema, unspecified: Secondary | ICD-10-CM | POA: Diagnosis not present

## 2019-05-30 DIAGNOSIS — R112 Nausea with vomiting, unspecified: Secondary | ICD-10-CM | POA: Diagnosis not present

## 2019-05-30 DIAGNOSIS — R195 Other fecal abnormalities: Secondary | ICD-10-CM | POA: Diagnosis not present

## 2019-05-30 DIAGNOSIS — I129 Hypertensive chronic kidney disease with stage 1 through stage 4 chronic kidney disease, or unspecified chronic kidney disease: Secondary | ICD-10-CM | POA: Diagnosis not present

## 2019-05-30 DIAGNOSIS — N184 Chronic kidney disease, stage 4 (severe): Secondary | ICD-10-CM | POA: Diagnosis present

## 2019-05-30 DIAGNOSIS — R6520 Severe sepsis without septic shock: Secondary | ICD-10-CM | POA: Diagnosis present

## 2019-05-30 DIAGNOSIS — Z03818 Encounter for observation for suspected exposure to other biological agents ruled out: Secondary | ICD-10-CM | POA: Diagnosis not present

## 2019-05-30 DIAGNOSIS — R410 Disorientation, unspecified: Secondary | ICD-10-CM | POA: Diagnosis not present

## 2019-05-30 DIAGNOSIS — Z885 Allergy status to narcotic agent status: Secondary | ICD-10-CM

## 2019-05-30 DIAGNOSIS — E1165 Type 2 diabetes mellitus with hyperglycemia: Secondary | ICD-10-CM | POA: Diagnosis present

## 2019-05-30 DIAGNOSIS — Z66 Do not resuscitate: Secondary | ICD-10-CM | POA: Diagnosis present

## 2019-05-30 DIAGNOSIS — F0280 Dementia in other diseases classified elsewhere without behavioral disturbance: Secondary | ICD-10-CM | POA: Diagnosis present

## 2019-05-30 DIAGNOSIS — N261 Atrophy of kidney (terminal): Secondary | ICD-10-CM | POA: Diagnosis not present

## 2019-05-30 DIAGNOSIS — Z853 Personal history of malignant neoplasm of breast: Secondary | ICD-10-CM

## 2019-05-30 DIAGNOSIS — R1312 Dysphagia, oropharyngeal phase: Secondary | ICD-10-CM | POA: Diagnosis not present

## 2019-05-30 DIAGNOSIS — N281 Cyst of kidney, acquired: Secondary | ICD-10-CM | POA: Diagnosis not present

## 2019-05-30 DIAGNOSIS — Z7401 Bed confinement status: Secondary | ICD-10-CM | POA: Diagnosis not present

## 2019-05-30 DIAGNOSIS — K8080 Other cholelithiasis without obstruction: Secondary | ICD-10-CM | POA: Diagnosis not present

## 2019-05-30 LAB — CBC WITH DIFFERENTIAL/PLATELET
Abs Immature Granulocytes: 0.05 10*3/uL (ref 0.00–0.07)
Basophils Absolute: 0 10*3/uL (ref 0.0–0.1)
Basophils Relative: 0 %
Eosinophils Absolute: 0 10*3/uL (ref 0.0–0.5)
Eosinophils Relative: 0 %
HCT: 26.2 % — ABNORMAL LOW (ref 36.0–46.0)
Hemoglobin: 8.8 g/dL — ABNORMAL LOW (ref 12.0–15.0)
Immature Granulocytes: 1 %
Lymphocytes Relative: 7 %
Lymphs Abs: 0.7 10*3/uL (ref 0.7–4.0)
MCH: 31.2 pg (ref 26.0–34.0)
MCHC: 33.6 g/dL (ref 30.0–36.0)
MCV: 92.9 fL (ref 80.0–100.0)
Monocytes Absolute: 0.5 10*3/uL (ref 0.1–1.0)
Monocytes Relative: 5 %
Neutro Abs: 9 10*3/uL — ABNORMAL HIGH (ref 1.7–7.7)
Neutrophils Relative %: 87 %
Platelets: 483 10*3/uL — ABNORMAL HIGH (ref 150–400)
RBC: 2.82 MIL/uL — ABNORMAL LOW (ref 3.87–5.11)
RDW: 21 % — ABNORMAL HIGH (ref 11.5–15.5)
WBC: 10.3 10*3/uL (ref 4.0–10.5)
nRBC: 0 % (ref 0.0–0.2)

## 2019-05-30 LAB — ETHANOL: Alcohol, Ethyl (B): <10 mg/dL (ref <10)

## 2019-05-30 LAB — I-STAT CHEM 8, ED
BUN: 27 mg/dL — ABNORMAL HIGH (ref 8–23)
Calcium, Ion: 0.94 mmol/L — ABNORMAL LOW (ref 1.15–1.40)
Chloride: 99 mmol/L (ref 98–111)
Creatinine, Ser: 2.4 mg/dL — ABNORMAL HIGH (ref 0.44–1.00)
Glucose, Bld: 168 mg/dL — ABNORMAL HIGH (ref 70–99)
HCT: 27 % — ABNORMAL LOW (ref 36.0–46.0)
Hemoglobin: 9.2 g/dL — ABNORMAL LOW (ref 12.0–15.0)
Potassium: 3 mmol/L — ABNORMAL LOW (ref 3.5–5.1)
Sodium: 135 mmol/L (ref 135–145)
TCO2: 20 mmol/L — ABNORMAL LOW (ref 22–32)

## 2019-05-30 LAB — COMPREHENSIVE METABOLIC PANEL
ALT: 15 U/L (ref 0–44)
AST: 20 U/L (ref 15–41)
Albumin: 2.7 g/dL — ABNORMAL LOW (ref 3.5–5.0)
Alkaline Phosphatase: 76 U/L (ref 38–126)
Anion gap: 18 — ABNORMAL HIGH (ref 5–15)
BUN: 34 mg/dL — ABNORMAL HIGH (ref 8–23)
CO2: 18 mmol/L — ABNORMAL LOW (ref 22–32)
Calcium: 7.8 mg/dL — ABNORMAL LOW (ref 8.9–10.3)
Chloride: 98 mmol/L (ref 98–111)
Creatinine, Ser: 2.57 mg/dL — ABNORMAL HIGH (ref 0.44–1.00)
GFR calc Af Amer: 20 mL/min — ABNORMAL LOW (ref >60)
GFR calc non Af Amer: 17 mL/min — ABNORMAL LOW (ref >60)
Glucose, Bld: 171 mg/dL — ABNORMAL HIGH (ref 70–99)
Potassium: 3 mmol/L — ABNORMAL LOW (ref 3.5–5.1)
Sodium: 134 mmol/L — ABNORMAL LOW (ref 135–145)
Total Bilirubin: 1.1 mg/dL (ref 0.3–1.2)
Total Protein: 7.3 g/dL (ref 6.5–8.1)

## 2019-05-30 LAB — HEMOGLOBIN A1C
Hgb A1c MFr Bld: 5.2 % (ref 4.8–5.6)
Mean Plasma Glucose: 102.54 mg/dL

## 2019-05-30 LAB — PROTIME-INR
INR: 1 (ref 0.8–1.2)
Prothrombin Time: 12.7 seconds (ref 11.4–15.2)

## 2019-05-30 LAB — URINALYSIS, ROUTINE W REFLEX MICROSCOPIC
Bilirubin Urine: NEGATIVE
Glucose, UA: NEGATIVE mg/dL
Hgb urine dipstick: NEGATIVE
Ketones, ur: 5 mg/dL — AB
Leukocytes,Ua: NEGATIVE
Nitrite: NEGATIVE
Protein, ur: NEGATIVE mg/dL
Specific Gravity, Urine: 1.005 (ref 1.005–1.030)
pH: 7 (ref 5.0–8.0)

## 2019-05-30 LAB — GLUCOSE, CAPILLARY
Glucose-Capillary: 133 mg/dL — ABNORMAL HIGH (ref 70–99)
Glucose-Capillary: 103 mg/dL — ABNORMAL HIGH (ref 70–99)

## 2019-05-30 LAB — LACTIC ACID, PLASMA
Lactic Acid, Venous: 2.9 mmol/L (ref 0.5–1.9)
Lactic Acid, Venous: 1.5 mmol/L (ref 0.5–1.9)

## 2019-05-30 LAB — POC OCCULT BLOOD, ED: Fecal Occult Bld: POSITIVE — AB

## 2019-05-30 LAB — AMMONIA: Ammonia: <9 umol/L — ABNORMAL LOW (ref 9–35)

## 2019-05-30 LAB — APTT: aPTT: 26 seconds (ref 24–36)

## 2019-05-30 LAB — LIPASE, BLOOD: Lipase: 64 U/L — ABNORMAL HIGH (ref 11–51)

## 2019-05-30 LAB — SARS CORONAVIRUS 2 BY RT PCR (HOSPITAL ORDER, PERFORMED IN ~~LOC~~ HOSPITAL LAB): SARS Coronavirus 2: NEGATIVE

## 2019-05-30 MED ORDER — MORPHINE SULFATE (PF) 4 MG/ML IV SOLN
4.0000 mg | Freq: Once | INTRAVENOUS | Status: AC
  Administered 2019-05-30: 4 mg via INTRAVENOUS
  Filled 2019-05-30: qty 1

## 2019-05-30 MED ORDER — ESCITALOPRAM OXALATE 20 MG PO TABS
20.0000 mg | ORAL_TABLET | Freq: Every day | ORAL | Status: DC
  Administered 2019-05-31 – 2019-06-09 (×10): 20 mg via ORAL
  Filled 2019-05-30 (×2): qty 2
  Filled 2019-05-30 (×2): qty 1
  Filled 2019-05-30 (×5): qty 2
  Filled 2019-05-30: qty 1

## 2019-05-30 MED ORDER — MAGNESIUM SULFATE 2 GM/50ML IV SOLN
2.0000 g | Freq: Once | INTRAVENOUS | Status: AC
  Administered 2019-05-30: 2 g via INTRAVENOUS
  Filled 2019-05-30: qty 50

## 2019-05-30 MED ORDER — SODIUM CHLORIDE 0.9 % IV BOLUS
1000.0000 mL | Freq: Once | INTRAVENOUS | Status: AC
  Administered 2019-05-30: 1000 mL via INTRAVENOUS

## 2019-05-30 MED ORDER — MIRTAZAPINE 15 MG PO TABS
15.0000 mg | ORAL_TABLET | Freq: Every day | ORAL | Status: DC
  Administered 2019-05-30 – 2019-06-08 (×9): 15 mg via ORAL
  Filled 2019-05-30: qty 2
  Filled 2019-05-30 (×8): qty 1
  Filled 2019-05-30: qty 2

## 2019-05-30 MED ORDER — LOPERAMIDE HCL 2 MG PO CAPS: 2.0000 mg | ORAL_CAPSULE | Freq: Four times a day (QID) | ORAL | Status: DC | PRN

## 2019-05-30 MED ORDER — POTASSIUM CHLORIDE CRYS ER 20 MEQ PO TBCR
40.0000 meq | EXTENDED_RELEASE_TABLET | Freq: Once | ORAL | Status: AC
  Administered 2019-05-30: 40 meq via ORAL
  Filled 2019-05-30: qty 2

## 2019-05-30 MED ORDER — ATORVASTATIN CALCIUM 10 MG PO TABS
20.0000 mg | ORAL_TABLET | Freq: Every day | ORAL | Status: DC
  Administered 2019-05-31 – 2019-06-09 (×10): 20 mg via ORAL
  Filled 2019-05-30: qty 2
  Filled 2019-05-30 (×2): qty 1
  Filled 2019-05-30 (×2): qty 2
  Filled 2019-05-30: qty 1
  Filled 2019-05-30 (×3): qty 2
  Filled 2019-05-30: qty 1
  Filled 2019-05-30 (×2): qty 2
  Filled 2019-05-30: qty 1
  Filled 2019-05-30: qty 2
  Filled 2019-05-30: qty 1
  Filled 2019-05-30: qty 2
  Filled 2019-05-30 (×2): qty 1

## 2019-05-30 MED ORDER — INSULIN ASPART 100 UNIT/ML ~~LOC~~ SOLN
0.0000 [IU] | Freq: Every day | SUBCUTANEOUS | Status: DC
  Filled 2019-05-30: qty 0.05

## 2019-05-30 MED ORDER — POTASSIUM CHLORIDE CRYS ER 20 MEQ PO TBCR
40.0000 meq | EXTENDED_RELEASE_TABLET | Freq: Three times a day (TID) | ORAL | Status: AC
  Administered 2019-05-30: 40 meq via ORAL
  Filled 2019-05-30 (×2): qty 2

## 2019-05-30 MED ORDER — POLYETHYLENE GLYCOL 3350 17 G PO PACK: 17.0000 g | PACK | Freq: Every day | ORAL | Status: DC | PRN

## 2019-05-30 MED ORDER — AMLODIPINE BESYLATE 10 MG PO TABS
10.0000 mg | ORAL_TABLET | Freq: Every day | ORAL | Status: DC
  Administered 2019-05-31 – 2019-06-09 (×10): 10 mg via ORAL
  Filled 2019-05-30 (×5): qty 2
  Filled 2019-05-30: qty 1
  Filled 2019-05-30 (×2): qty 2
  Filled 2019-05-30 (×2): qty 1

## 2019-05-30 MED ORDER — PIPERACILLIN-TAZOBACTAM IN DEX 2-0.25 GM/50ML IV SOLN
2.2500 g | Freq: Three times a day (TID) | INTRAVENOUS | Status: DC
  Administered 2019-05-30 – 2019-06-04 (×14): 2.25 g via INTRAVENOUS
  Filled 2019-05-30 (×16): qty 50

## 2019-05-30 MED ORDER — PANTOPRAZOLE SODIUM 40 MG PO TBEC
40.0000 mg | DELAYED_RELEASE_TABLET | Freq: Every day | ORAL | Status: DC
  Administered 2019-05-31 – 2019-06-09 (×10): 40 mg via ORAL
  Filled 2019-05-30 (×10): qty 1

## 2019-05-30 MED ORDER — FERROUS SULFATE 325 (65 FE) MG PO TABS
325.0000 mg | ORAL_TABLET | Freq: Every day | ORAL | Status: DC
  Administered 2019-05-31 – 2019-06-09 (×9): 325 mg via ORAL
  Filled 2019-05-30 (×10): qty 1

## 2019-05-30 MED ORDER — INSULIN ASPART 100 UNIT/ML ~~LOC~~ SOLN
0.0000 [IU] | Freq: Three times a day (TID) | SUBCUTANEOUS | Status: DC
  Administered 2019-05-30 – 2019-06-08 (×4): 1 [IU] via SUBCUTANEOUS
  Administered 2019-06-08 – 2019-06-09 (×2): 2 [IU] via SUBCUTANEOUS
  Filled 2019-05-30: qty 0.09

## 2019-05-30 MED ORDER — OXYCODONE HCL 5 MG PO TABS: 5.0000 mg | ORAL_TABLET | ORAL | Status: DC | PRN

## 2019-05-30 MED ORDER — VITAMIN D3 25 MCG (1000 UNIT) PO TABS
1000.0000 [IU] | ORAL_TABLET | Freq: Every day | ORAL | Status: DC
  Administered 2019-05-31 – 2019-06-09 (×10): 1000 [IU] via ORAL
  Filled 2019-05-30 (×10): qty 1

## 2019-05-30 MED ORDER — HYDROMORPHONE HCL 1 MG/ML IJ SOLN
0.5000 mg | INTRAMUSCULAR | Status: DC | PRN
  Administered 2019-05-30: 16:00:00 0.5 mg via INTRAVENOUS
  Filled 2019-05-30: qty 1

## 2019-05-30 MED ORDER — ONDANSETRON HCL 4 MG/2ML IJ SOLN
4.0000 mg | Freq: Once | INTRAMUSCULAR | Status: AC
  Administered 2019-05-30: 4 mg via INTRAVENOUS
  Filled 2019-05-30: qty 2

## 2019-05-30 MED ORDER — DICYCLOMINE HCL 10 MG PO CAPS
20.0000 mg | ORAL_CAPSULE | Freq: Three times a day (TID) | ORAL | Status: DC
  Administered 2019-05-30 – 2019-06-04 (×18): 20 mg via ORAL
  Filled 2019-05-30 (×18): qty 2

## 2019-05-30 MED ORDER — HYDROXYZINE HCL 10 MG PO TABS
10.0000 mg | ORAL_TABLET | Freq: Three times a day (TID) | ORAL | Status: DC | PRN
  Filled 2019-05-30: qty 1

## 2019-05-30 MED ORDER — VITAMIN B-12 1000 MCG PO TABS
1000.0000 ug | ORAL_TABLET | Freq: Every day | ORAL | Status: DC
  Administered 2019-05-31 – 2019-06-09 (×10): 1000 ug via ORAL
  Filled 2019-05-30 (×10): qty 1

## 2019-05-30 MED ORDER — STERILE WATER FOR INJECTION IV SOLN
INTRAVENOUS | Status: DC
  Administered 2019-05-30 (×2): via INTRAVENOUS
  Filled 2019-05-30 (×4): qty 850

## 2019-05-30 NOTE — Patient Outreach (Signed)
[facility]) Care Management  05/30/2019  LOURETTA TANTILLO [birth year] [id number]   [facility] BSW closing case due to pt's decline for all [facility] services per RNCM, [name].   Ronn Melena, BSW Social Worker [phone]

## 2019-05-30 NOTE — ED Notes (Signed)
Date and time results received: 05/30/19 7:35 AM  (use smartphrase ".now" to insert current time)  Test: Lactic Acid Critical Value: 2.9  Name of Provider Notified: P.A. Bowie  Orders Received? Or Actions Taken?:

## 2019-05-30 NOTE — ED Provider Notes (Signed)
[facility] DEPT Provider Note   CSN: [id number] Arrival date & time: 05/30/19  0540     History   Chief Complaint Chief Complaint  Patient presents with  . Nausea    HPI Samantha Mckenzie is a 83 y.o. female history of breast cancer, depression, GERD, gout, hyperlipidemia, hypertension, diabetes presents today via EMS.  History obtained from nursing staff as EMS is no longer at bedside.  Patient's husband called EMS as patient complaining of nausea without vomiting.  No further history is available. - Upon my initial evaluation patient was resting comfortably in bed appeared to be asleep, when I began talking to the patient she begins to moan loudly and hyperventilate, she was not hyperventilating before I entered the room and spoke to her.  She will occasionally answer no to some questions such as when I press on her abdomen but these responses appear to be unreliable.    HPI  Past Medical History:  Diagnosis Date  . Allergic rhinitis   . Anemia   . Breast CA ([place])    surgery, chemo, XRT; had peripheral neuropathy (imbalance at times) after chemo  . Cellulitis    Left arm, recurrent   . Cellulitis LEFT arm recurrent 12/08/2011  . Chronic renal disease, stage IV (HCC)    Dr. Moshe Cipro  . Depression   . Fatigue   . GERD (gastroesophageal reflux disease)    gastritis, EGD 02/2007  . Gout   . Hyperlipidemia   . Hypertension   . Macular degeneration 2011  . Normal cardiac stress test 12/2014  . Osteoarthritis   . Renal insufficiency     Patient Active Problem List   Diagnosis Date Noted  . Ileitis, terminal ([place]) 04/22/2019  . COPD (chronic obstructive pulmonary disease) ([place]) 04/12/2019  . Enteritis 04/07/2019  . Chronic diastolic CHF (congestive heart failure) ([place]) 04/07/2019  . Hypokalemia 04/07/2019  . Hypocalcemia 04/07/2019  . Fall 04/07/2019  . Occult blood positive stool 04/07/2019  . Adjustment disorder with depressed mood    . Generalized abdominal pain   . Dysphasia 03/21/2019  . CAP (community acquired pneumonia) 03/20/2019  . Nausea vomiting and diarrhea 03/20/2019  . Acute respiratory failure with hypoxia ([place]) 03/20/2019  . Acute renal failure superimposed on stage 4 chronic kidney disease ([place]) 03/20/2019  . Lactic acidosis 03/20/2019  . Diabetic peripheral neuropathy associated with type 2 diabetes mellitus ([place]) 03/20/2019  . Hypomagnesemia 06/21/2018  . Generalized weakness 06/20/2018  . PCP NOTES >>> 07/11/2015  . Depression   . VITAMIN B12 DEFICIENCY 04/01/2008  . GERD 12/19/2007  . NEOP, MALIGNANT, FEMALE BREAST NOS 07/07/2007  . Osteoarthritis 07/07/2007  . Diabetes mellitus type 2 with complications ([place]) 58/59/2924  . Hyperlipidemia 02/28/2007  . GOUT 02/28/2007  . Normocytic anemia 02/28/2007  . Essential hypertension 02/28/2007    Past Surgical History:  Procedure Laterality Date  . cataracts bilaterally  1 -2012  . LUNG BIOPSY  1999   neg  . MASTECTOMY Left 1999   and lymphnodes      OB History   No obstetric history on file.      Home Medications    Prior to Admission medications   Medication Sig Start Date End Date Taking? Authorizing Provider  acetaminophen (TYLENOL) 325 MG tablet Take 2 tablets (650 mg total) by mouth every 6 (six) hours as needed for mild pain, fever or headache (or Fever >/= 101). Patient not taking: Reported on 05/24/2019 04/12/19   Roxan Hockey, MD  albuterol (PROVENTIL) (2.5 MG/3ML) 0.083% nebulizer solution Take 3 mLs (2.5 mg total) by nebulization every 4 (four) hours as needed for wheezing or shortness of breath. Patient not taking: Reported on 05/24/2019 04/12/19   Roxan Hockey, MD  allopurinol (ZYLOPRIM) 100 MG tablet Take 1 tablet (100 mg total) by mouth daily. 03/19/19   Colon Branch, MD  amLODipine (NORVASC) 10 MG tablet Take 1 tablet (10 mg total) by mouth daily. 03/06/19   Colon Branch, MD  aspirin 81 MG tablet Take 1 tablet (81 mg  total) by mouth daily with breakfast. Patient not taking: Reported on 05/24/2019 04/12/19   Roxan Hockey, MD  atorvastatin (LIPITOR) 20 MG tablet Take 1 tablet (20 mg total) by mouth daily. 01/31/19   Colon Branch, MD  Calcium Carb-Cholecalciferol (CALTRATE 600+D3 PO) Take 1 tablet by mouth daily.    [provider]  Cholecalciferol (VITAMIN D3) 1000 UNITS CAPS Take 1 capsule by mouth daily.     [provider]  dicyclomine (BENTYL) 10 MG capsule Take 2 capsules (20 mg total) by mouth 4 (four) times daily -  before meals and at bedtime. 05/10/19   Colon Branch, MD  dronabinol (MARINOL) 2.5 MG capsule Take 1 capsule (2.5 mg total) by mouth 2 (two) times daily before a meal. 05/10/19   Colon Branch, MD  escitalopram (LEXAPRO) 10 MG tablet Take 2 tablets (20 mg total) by mouth daily. 05/21/19   Colon Branch, MD  hydrOXYzine (ATARAX/VISTARIL) 10 MG tablet Take 1 tablet (10 mg total) by mouth 3 (three) times daily as needed for nausea. Patient not taking: Reported on 05/24/2019 04/12/19   Roxan Hockey, MD  loperamide (IMODIUM A-D) 2 MG tablet Take 2 mg by mouth 4 (four) times daily as needed for diarrhea or loose stools.    [provider]  metoprolol tartrate (LOPRESSOR) 25 MG tablet Take 1 tablet (25 mg total) by mouth 2 (two) times daily. 04/12/19   Roxan Hockey, MD  Multiple Vitamins-Minerals (CENTRUM SILVER PO) Take 1 tablet by mouth daily.     [provider]  ondansetron (ZOFRAN-ODT) 4 MG disintegrating tablet Take 1 tablet (4 mg total) by mouth every 6 (six) hours as needed for nausea or vomiting. Patient not taking: Reported on 05/28/2019 05/24/19   Drenda Freeze, MD  pantoprazole (PROTONIX) 40 MG tablet Take 1 tablet (40 mg total) by mouth daily before breakfast. 05/10/19   Colon Branch, MD  vitamin B-12 (CYANOCOBALAMIN) 1000 MCG tablet Take 1,000 mcg by mouth daily.      [provider]  vitamin C (ASCORBIC ACID) 500 MG tablet Take 500 mg by mouth  daily.    [provider]    Family History Family History  Problem Relation Age of Onset  . Breast cancer Other        ? of   . Heart attack Father 28  . Diabetes Sister        2  . Hypertension Sister   . Colon cancer Neg Hx   . Stomach cancer Neg Hx     Social History Social History   Tobacco Use  . Smoking status: Never Smoker  . Smokeless tobacco: Never Used  Substance Use Topics  . Alcohol use: Not Currently    Alcohol/week: 0.0 standard drinks    Comment: rarely  . Drug use: No     Allergies   Codeine, Morphine, Oxycodone-acetaminophen, Sulfonamide derivatives, and Tramadol hcl   Review of Systems  Review of Systems  Unable to perform ROS: Mental status change   Physical Exam Updated Vital Signs BP 120/83   Pulse 82   Temp 98 F (36.7 C) (Oral)   Resp (!) 29   SpO2 98%   Physical Exam Constitutional:      General: She is not in acute distress.    Appearance: Normal appearance. She is well-developed. She is not ill-appearing or diaphoretic.  HENT:     Head: Normocephalic and atraumatic. No raccoon eyes or Battle's sign.     Jaw: There is normal jaw occlusion. No trismus.     Right Ear: External ear normal.     Left Ear: External ear normal.     Nose: Nose normal. No rhinorrhea.     Right Nostril: No epistaxis.     Left Nostril: No epistaxis.     Mouth/Throat:     Mouth: Mucous membranes are moist.     Pharynx: Oropharynx is clear.  Eyes:     Pupils: Pupils are equal, round, and reactive to light.     Comments: Patient refuses to open her eyes, she holds them tightly shut during examination.  Neck:     Musculoskeletal: Normal range of motion and neck supple. No spinous process tenderness.     Trachea: Trachea and phonation normal. No tracheal tenderness or tracheal deviation.  Cardiovascular:     Rate and Rhythm: Normal rate and regular rhythm.     Pulses:          Dorsalis pedis pulses are 2+ on the right side and 2+ on the left  side.     Heart sounds: Normal heart sounds.  Pulmonary:     Effort: Pulmonary effort is normal. Tachypnea present. No accessory muscle usage or respiratory distress.     Breath sounds: Normal breath sounds and air entry.  Chest:     Chest wall: No deformity, tenderness or crepitus.  Abdominal:     General: There is no distension.     Palpations: Abdomen is soft.     Tenderness: There is no abdominal tenderness. There is no guarding or rebound.  Musculoskeletal: Normal range of motion.     Comments: Three-person logroll utilized for evaluation of the back and neck. - No midline C/T/L spinal tenderness to palpation, no paraspinal muscle tenderness, no deformity, crepitus, or step-off noted. No sign of injury to the neck or back.  Skin:    General: Skin is warm and dry.  Neurological:     Mental Status: She is alert.     GCS: GCS eye subscore is 3. GCS verbal subscore is 3. GCS motor subscore is 4.    ED Treatments / Results  Labs (all labs ordered are listed, but only abnormal results are displayed) Labs Reviewed - No data to display  EKG None  Radiology No results found.  Procedures Procedures (including critical care time)  Medications Ordered in ED Medications - No data to display   Initial Impression / Assessment and Plan / ED Course  I have reviewed the triage vital signs and the nursing notes.  Pertinent labs & imaging results that were available during my care of the patient were reviewed by me and considered in my medical decision making (see chart for details).    Patient with altered mental status, unclear recent history, no sign of significant injury but she did arrive in a c-collar by EMS.  Will obtain CT head neck and broad work-up today to evaluate for causes  of altered mental status in this elderly female.  Chart review shows patient with headache 1 week ago with nonacute head CT.,  At that time it appears there was concern for significant behavioral and  personality changes over the past few months and concern for frontotemporal dementia by PCP? - Initial vital signs, afebrile, no tachycardia or hypoxia on room air, patient is tachypneic. - I-STAT Chem-8 creatinine of 2.4 appears baseline, calcium 0.94 Urinalysis nonacute CBC with hemoglobin of 8.8 appears similar to prior - Remainder of lab work pending, imaging pending.  Care handoff given to Domenic Moras PA-C at shift change.  Disposition per oncoming team.  Patient was seen and evaluated by Dr. Leonette Monarch during this visit.  Note: Portions of this report may have been transcribed using voice recognition software. Every effort was made to ensure accuracy; however, inadvertent computerized transcription errors may still be present. Final Clinical Impressions(s) / ED Diagnoses   Final diagnoses:  None    ED Discharge Orders    None       Gari Crown 05/30/19 0165    Fatima Blank, MD 06/01/19 [id number]

## 2019-05-30 NOTE — ED Notes (Signed)
Patient moaning in room, only will answer certain questions. Patient had a bowel movement, cleaned, brief and new linens applied. Patient able to follow commands, but still moaning.

## 2019-05-30 NOTE — H&P (Addendum)
History and Physical  Samantha Mckenzie FKC:[id number] DOB: 01/28/1936 DOA: 05/30/2019  Referring physician: Dr. Merry Lofty PCP: Colon Branch, MD  Outpatient Specialists: None Patient coming from: Home  Chief Complaint: Abdominal pain, nausea vomiting and diarrhea.  HPI: Samantha Mckenzie is a 83 y.o. female with medical history significant for GERD, previous terminal ileitis a month ago, type 2 diabetes, diabetic peripheral neuropathy, COPD, chronic diastolic CHF, breast cancer status post left mastectomy who presented to [facility] ED from home with complaints of diffuse abdominal pain, nausea vomiting and diarrhea.  Patient is alert and oriented x3 however she is a poor historian and does not always answer questions asked.  Reports nausea vomiting and diarrhea started a few days ago associated with abdominal cramping.  Admits to poor oral intake.  Admits to frank blood in stool this morning.  Positive for chills and no night sweats.  States she has had a colonoscopy in the past but will not elaborate.  No improving or worsening factors.  ED Course: On presentation to the ED hypertensive and tachypneic.  Lab studies remarkable for hypokalemia, anemia and positive FOBT, elevated BUN and creatinine.  CT abdomen and pelvis with no contrast revealed suspected terminal ileitis.  TRH asked to admit.  Review of Systems: Review of systems as noted in the HPI. All other systems reviewed and are negative.   Past Medical History:  Diagnosis Date   Allergic rhinitis    Anemia    Breast CA ([place])    surgery, chemo, XRT; had peripheral neuropathy (imbalance at times) after chemo   Cellulitis    Left arm, recurrent    Cellulitis LEFT arm recurrent 12/08/2011   Chronic renal disease, stage IV ([place])    Dr. Moshe Cipro   Depression    Fatigue    GERD (gastroesophageal reflux disease)    gastritis, EGD 02/2007   Gout    Hyperlipidemia    Hypertension    Macular degeneration 2011   Normal  cardiac stress test 12/2014   Osteoarthritis    Renal insufficiency    Past Surgical History:  Procedure Laterality Date   cataracts bilaterally  1 -2012   LUNG BIOPSY  1999   neg   MASTECTOMY Left 1999   and lymphnodes     Social History:  reports that she has never smoked. She has never used smokeless tobacco. She reports previous alcohol use. She reports that she does not use drugs.   Allergies  Allergen Reactions   Codeine Nausea And Vomiting   Morphine Nausea And Vomiting   Oxycodone-Acetaminophen Nausea And Vomiting   Sulfonamide Derivatives Nausea And Vomiting   Tramadol Hcl Nausea And Vomiting    Family History  Problem Relation Age of Onset   Breast cancer Other        ? of    Heart attack Father 54   Diabetes Sister        2   Hypertension Sister    Colon cancer Neg Hx    Stomach cancer Neg Hx       Prior to Admission medications   Medication Sig Start Date End Date Taking? Authorizing Provider  mirtazapine (REMERON) 15 MG tablet Take 15 mg by mouth at bedtime.   Yes [provider]  acetaminophen (TYLENOL) 325 MG tablet Take 2 tablets (650 mg total) by mouth every 6 (six) hours as needed for mild pain, fever or headache (or Fever >/= 101). Patient not taking: Reported on 05/24/2019 04/12/19   Emokpae,  Courage, MD  albuterol (PROVENTIL) (2.5 MG/3ML) 0.083% nebulizer solution Take 3 mLs (2.5 mg total) by nebulization every 4 (four) hours as needed for wheezing or shortness of breath. Patient not taking: Reported on 05/24/2019 04/12/19   Roxan Hockey, MD  allopurinol (ZYLOPRIM) 100 MG tablet Take 1 tablet (100 mg total) by mouth daily. 03/19/19   Colon Branch, MD  amLODipine (NORVASC) 10 MG tablet Take 1 tablet (10 mg total) by mouth daily. 03/06/19   Colon Branch, MD  aspirin 81 MG tablet Take 1 tablet (81 mg total) by mouth daily with breakfast. Patient not taking: Reported on 05/24/2019 04/12/19   Roxan Hockey, MD  atorvastatin (LIPITOR)  20 MG tablet Take 1 tablet (20 mg total) by mouth daily. 01/31/19   Colon Branch, MD  Calcium Carb-Cholecalciferol (CALTRATE 600+D3 PO) Take 1 tablet by mouth daily.    [provider]  Cholecalciferol (VITAMIN D3) 1000 UNITS CAPS Take 1 capsule by mouth daily.     [provider]  dicyclomine (BENTYL) 10 MG capsule Take 2 capsules (20 mg total) by mouth 4 (four) times daily -  before meals and at bedtime. 05/10/19   Colon Branch, MD  dronabinol (MARINOL) 2.5 MG capsule Take 1 capsule (2.5 mg total) by mouth 2 (two) times daily before a meal. 05/10/19   Colon Branch, MD  escitalopram (LEXAPRO) 10 MG tablet Take 2 tablets (20 mg total) by mouth daily. 05/21/19   Colon Branch, MD  hydrOXYzine (ATARAX/VISTARIL) 10 MG tablet Take 1 tablet (10 mg total) by mouth 3 (three) times daily as needed for nausea. Patient not taking: Reported on 05/24/2019 04/12/19   Roxan Hockey, MD  loperamide (IMODIUM A-D) 2 MG tablet Take 2 mg by mouth 4 (four) times daily as needed for diarrhea or loose stools.    [provider]  metoprolol tartrate (LOPRESSOR) 25 MG tablet Take 1 tablet (25 mg total) by mouth 2 (two) times daily. 04/12/19   Roxan Hockey, MD  Multiple Vitamins-Minerals (CENTRUM SILVER PO) Take 1 tablet by mouth daily.     [provider]  ondansetron (ZOFRAN-ODT) 4 MG disintegrating tablet Take 1 tablet (4 mg total) by mouth every 6 (six) hours as needed for nausea or vomiting. Patient not taking: Reported on 05/28/2019 05/24/19   Drenda Freeze, MD  pantoprazole (PROTONIX) 40 MG tablet Take 1 tablet (40 mg total) by mouth daily before breakfast. 05/10/19   Colon Branch, MD  vitamin B-12 (CYANOCOBALAMIN) 1000 MCG tablet Take 1,000 mcg by mouth daily.      [provider]  vitamin C (ASCORBIC ACID) 500 MG tablet Take 500 mg by mouth daily.    [provider]    Physical Exam: BP (!) 144/57    Pulse 76    Temp (!) 97.3 F (36.3 C) (Rectal)    Resp 12    SpO2  100%    General: 83 y.o. year-old female well developed well nourished in no acute distress.  Alert and oriented x4.  Cardiovascular: Regular rate and rhythm with no rubs or gallops.  No thyromegaly or JVD noted.  No lower extremity edema. 2/4 pulses in all 4 extremities.  Respiratory: Clear to auscultation with no wheezes or rales. Good inspiratory effort.  Abdomen: Soft diffuse tenderness on palpation.  Nondistended with hypoactive bowel sounds x4 quadrants.  Muskuloskeletal: No cyanosis, clubbing.  Trace edema in upper and lower extremities bilaterally.    Neuro: CN II-XII intact, strength,  sensation, reflexes  Skin: No ulcerative lesions noted or rashes  Psychiatry: Judgement and insight appear normal. Mood is appropriate for condition and setting          Labs on Admission:  Basic Metabolic Panel: Recent Labs  Lab 05/24/19 1123 05/30/19 0616 05/30/19 0654  NA 138 134* 135  K 4.0 3.0* 3.0*  CL 96* 98 99  CO2 26 18*  --   GLUCOSE 127* 171* 168*  BUN 28* 34* 27*  CREATININE 2.23* 2.57* 2.40*  CALCIUM 8.0* 7.8*  --    Liver Function Tests: Recent Labs  Lab 05/24/19 1123 05/30/19 0616  AST 31 20  ALT 14 15  ALKPHOS 77 76  BILITOT 1.7* 1.1  PROT 6.8 7.3  ALBUMIN 2.0* 2.7*   Recent Labs  Lab 05/24/19 1123 05/30/19 0616  LIPASE 57* 64*   Recent Labs  Lab 05/30/19 0618  AMMONIA <9*   CBC: Recent Labs  Lab 05/24/19 1123 05/30/19 0616 05/30/19 0654  WBC 10.8* 10.3  --   NEUTROABS  --  9.0*  --   HGB 9.1* 8.8* 9.2*  HCT 27.7* 26.2* 27.0*  MCV 93.0 92.9  --   PLT 548* 483*  --    Cardiac Enzymes: No results for input(s): CKTOTAL, CKMB, CKMBINDEX, TROPONINI in the last 168 hours.  BNP (last 3 results) Recent Labs    04/07/19 2157  BNP 294.1*    ProBNP (last 3 results) No results for input(s): PROBNP in the last 8760 hours.  CBG: No results for input(s): GLUCAP in the last 168 hours.  Radiological Exams on Admission: Ct Abdomen Pelvis Wo  Contrast  Result Date: 05/30/2019 CLINICAL DATA:  Generalized abdominal pain, nausea EXAM: CT ABDOMEN AND PELVIS WITHOUT CONTRAST TECHNIQUE: Multidetector CT imaging of the abdomen and pelvis was performed following the standard protocol without IV contrast. COMPARISON:  04/20/2019 FINDINGS: Lower chest: No acute abnormality. Hepatobiliary: No focal liver abnormality is seen. No gallstones, gallbladder wall thickening, or biliary dilatation. Pancreas: Unremarkable. No pancreatic ductal dilatation or surrounding inflammatory changes. Spleen: Scattered calcified granulomas within the spleen. No acute findings. Adrenals/Urinary Tract: Unremarkable adrenal glands. Bilateral renal cortical atrophy. Stable appearance of bilateral renal cysts. Extrarenal pelvis is noted bilaterally. No hydronephrosis. Ureters nondilated. Urinary bladder is moderately distended without acute abnormality. Stomach/Bowel: There is long segment wall thickening with mild surrounding fat stranding of the terminal ileum with area of caliber change in the right lower quadrant (series 2, image 49) and mild distension of the upstream ilium measuring up to 2.9 cm in diameter. Remaining small bowel is within normal limits. Scattered colonic diverticulosis. No pericolonic inflammatory changes. Small hiatal hernia. Vascular/Lymphatic: Atherosclerotic calcification of the aortoiliac axis. No abdominal or pelvic lymphadenopathy identified. Reproductive: Uterus and bilateral adnexa are unremarkable. Other: No abdominal wall hernia or abnormality. No abdominopelvic ascites. No abscess. Musculoskeletal: Grade 1 anterolisthesis L4 on L5. No acute osseous findings. IMPRESSION: 1. Long segment wall thickening and surrounding mild inflammatory changes of the terminal ileum suggesting terminal ileitis, which can be seen in the setting of inflammatory bowel disease. 2. There is focal caliber change proximally with mild distension of the more proximal ileum  raising the possibility of stricture. No evidence to suggest small bowel obstruction. Electronically Signed   By: Davina Poke M.D.   On: 05/30/2019 11:25   Ct Head Wo Contrast  Result Date: 05/30/2019 CLINICAL DATA:  Altered level of consciousness EXAM: CT HEAD WITHOUT CONTRAST CT CERVICAL SPINE WITHOUT CONTRAST TECHNIQUE: Multidetector CT imaging of  the head and cervical spine was performed following the standard protocol without intravenous contrast. Multiplanar CT image reconstructions of the cervical spine were also generated. COMPARISON:  05/24/2019 FINDINGS: CT HEAD FINDINGS Brain: No evidence of acute infarction, hemorrhage, extra-axial collection, ventriculomegaly, or mass effect. Generalized cerebral atrophy. Periventricular white matter low attenuation likely secondary to microangiopathy. Vascular: Cerebrovascular atherosclerotic calcifications are noted. Skull: Negative for fracture or focal lesion. Sinuses/Orbits: Visualized portions of the orbits are unremarkable. Visualized portions of the paranasal sinuses and mastoid air cells are unremarkable. Other: Scalp contusion along the left vertex. CT CERVICAL SPINE FINDINGS Alignment: Normal. Skull base and vertebrae: No acute fracture. No primary bone lesion or focal pathologic process. Erosion along the anterior aspect of the dens as can be seen with crystalline arthropathy. Soft tissues and spinal canal: No prevertebral fluid or swelling. No visible canal hematoma. Disc levels: Degenerative disc disease with disc height loss at C4-5 and C5-6. Right uncovertebral degenerative changes at C4-5 and C5-6 resulting in foraminal stenosis. Mild left foraminal stenosis at C4-5. Upper chest: Left apical scarring. Other: Bilateral carotid artery atherosclerosis. IMPRESSION: 1. No acute intracranial pathology. 2.  No acute osseous injury of the cervical spine. Electronically Signed   By: Kathreen Devoid   On: 05/30/2019 07:33   Ct Cervical Spine Wo  Contrast  Result Date: 05/30/2019 CLINICAL DATA:  Altered level of consciousness EXAM: CT HEAD WITHOUT CONTRAST CT CERVICAL SPINE WITHOUT CONTRAST TECHNIQUE: Multidetector CT imaging of the head and cervical spine was performed following the standard protocol without intravenous contrast. Multiplanar CT image reconstructions of the cervical spine were also generated. COMPARISON:  05/24/2019 FINDINGS: CT HEAD FINDINGS Brain: No evidence of acute infarction, hemorrhage, extra-axial collection, ventriculomegaly, or mass effect. Generalized cerebral atrophy. Periventricular white matter low attenuation likely secondary to microangiopathy. Vascular: Cerebrovascular atherosclerotic calcifications are noted. Skull: Negative for fracture or focal lesion. Sinuses/Orbits: Visualized portions of the orbits are unremarkable. Visualized portions of the paranasal sinuses and mastoid air cells are unremarkable. Other: Scalp contusion along the left vertex. CT CERVICAL SPINE FINDINGS Alignment: Normal. Skull base and vertebrae: No acute fracture. No primary bone lesion or focal pathologic process. Erosion along the anterior aspect of the dens as can be seen with crystalline arthropathy. Soft tissues and spinal canal: No prevertebral fluid or swelling. No visible canal hematoma. Disc levels: Degenerative disc disease with disc height loss at C4-5 and C5-6. Right uncovertebral degenerative changes at C4-5 and C5-6 resulting in foraminal stenosis. Mild left foraminal stenosis at C4-5. Upper chest: Left apical scarring. Other: Bilateral carotid artery atherosclerosis. IMPRESSION: 1. No acute intracranial pathology. 2.  No acute osseous injury of the cervical spine. Electronically Signed   By: Kathreen Devoid   On: 05/30/2019 07:33   Dg Chest Portable 1 View  Result Date: 05/30/2019 CLINICAL DATA:  Altered mental status EXAM: PORTABLE CHEST 1 VIEW COMPARISON:  05/16/2019 FINDINGS: The heart size and mediastinal contours are within  normal limits. Both lungs are clear. Old left posterior rib fractures. Surgical clips in the left axilla. IMPRESSION: No active disease. Electronically Signed   By: Kathreen Devoid   On: 05/30/2019 06:57    EKG: I independently viewed the EKG done and my findings are as followed: Sinus rhythm with rate of 81 and prolonged QTC 508.  Assessment/Plan Present on Admission:  Ileitis  Active Problems:   Ileitis   Suspected terminal ileitis Presented with diffuse abdominal pain, nausea vomiting and diarrhea, positive FOBT CT abdomen and pelvis with no contrast shows  suspected terminal ileitis Possibly recurrent, diagnosed a month ago Start IV Zosyn and IV fluids at 42 cc/h empirically N.p.o. for now or clear liquid diet as tolerated Monitor WBC and fever curve GI consult Optimize pain control  Suspected GI bleed, likely lower Self-reported hematochezia Positive FOBT, hemoglobin 9 on presentation Monitor H&H  Acute dehydration in the setting of nausea vomiting diarrhea Start IV fluids at 75 cc/h Closely monitor vital signs Monitor ins and outs  AKI on CKD 4 Baseline creatinine appears to be 2.1 with GFR of 21 Presented with creatinine of 2.57 with GFR of 17 Creatinine improving to 2.40 on 05/30/2019 Avoid nephrotoxins Start gentle IV fluid hydration Monitor urine output  Type 2 diabetes with hyperglycemia Obtain hemoglobin A1c Hold oral anti-glycemic's Start insulin sliding scale Avoid hypoglycemia  Mild metabolic acidosis Presented with chemistry bicarb 18 Continue isotonic bicarb 75 cc/h x 1 day Repeat BMP in the morning  Elevated lipase Presented with lipase 64 N.p.o. or clear liquid diet as tolerated Repeat levels in the morning  Anemia of chronic disease secondary to CKD versus others/iron deficiency anemia Presented with hemoglobin 8.8 Baseline hemoglobin appears to be 9 Iron studies done on 04/07/2019 revealed iron deficiency Start ferrous sulfate 325  daily  Chronic diastolic CHF Last 2D echo done on 06/07/2017 showed preserved LVEF 55% and grade 1 diastolic dysfunction Euvolemic on exam Closely monitor volume status Gentle IV fluid hydration Strict I's and O's and daily weight  History of breast cancer status post left mastectomy No acute issues  Risks: Patient is at high risk for decompensation due to suspected terminal ileitis, multiple comorbidities and advanced age.  Patient will require at least 2 midnights for further evaluation and treatment of present condition.   DVT prophylaxis: SCDs due to suspected GI bleed.  Code Status: DNR, as stated by the patient herself.  She is alert and oriented x4.  Family Communication: None at bedside.  Will call if okay with the patient.  Disposition Plan: Admit to telemetry unit.  Consults called: Please consult GI.  Admission status: Inpatient status.    Kayleen Memos MD [facility] Pager [phone]  If 7PM-7AM, please contact night-coverage [email or web address] Password [email or web address]  05/30/2019, 1:03 PM

## 2019-05-30 NOTE — Progress Notes (Signed)
Pharmacy Antibiotic Note  Samantha Mckenzie is a 83 y.o. female with hx CKD 4 and recent hx terminal ileitis presented to the ED on 05/30/2019 with c/o abd pain, n/v/d. Abd CT showed findings suggesting terminal ileitis.  To start zosyn for intra-abd infection.  - scr 2.57 (crcl~15) - sodium bicarb drip ordered by MD  Plan: - zosyn 2.25 gm IV q8h -monitor for renal function  ____________________________________________  Temp (24hrs), Avg:97.7 F (36.5 C), Min:97.3 F (36.3 C), Max:98 F (36.7 C)  Recent Labs  Lab 05/24/19 1123 05/30/19 0616 05/30/19 0617 05/30/19 0654  WBC 10.8* 10.3  --   --   CREATININE 2.23* 2.57*  --  2.40*  LATICACIDVEN  --   --  2.9*  --     Estimated Creatinine Clearance: 15.5 mL/min (A) (by C-G formula based on SCr of 2.4 mg/dL (H)).    Allergies  Allergen Reactions  . Codeine Nausea And Vomiting  . Morphine Nausea And Vomiting  . Oxycodone-Acetaminophen Nausea And Vomiting  . Sulfonamide Derivatives Nausea And Vomiting  . Tramadol Hcl Nausea And Vomiting      Thank you for allowing pharmacy to be a part of this patient's care.  Lynelle Doctor 05/30/2019 1:36 PM

## 2019-05-30 NOTE — ED Notes (Signed)
In and out cath'ed completed with Lilli, NT. Patient provided about 20 mL of clear, yellow urine.

## 2019-05-30 NOTE — ED Provider Notes (Addendum)
I have reviewed patient's chart.  For the past month patient has had several ED visits with similar complaint which includes headache, weakness, GI issues.  She has been placed in a skilled nursing facility twice in the past month however she left on her own prior to discharge.  Her last ER visit was 8/13 in which she refused placement.  Provider was worse and concern for potential frontotemporal lobe dementia.  Psychiatry has seen and evaluated patient and cleared patient on 8/03.  Patient son has documented the patient has been steadily declining in her mental faculties as well as managing of ADL.   Work-up today include an unchanged EKG, chest x-ray unremarkable, head and cervical spine CT without acute finding, mild elevated lipase of 64 similar to prior, evidence of hypo-kalemia with potassium of 3.0, supplementation given.  Evidence of CKD with BUN 34, creatinine 2.57 similar to baseline.  Elevated lactic acid of 2.9, similar to prior values, hemoglobin is 8.8, near baseline, UA without signs of urinary tract infection.  Patient however continues to moan, appears to be uncomfortable, having chills, and does have diffuse abdominal discomfort.  Will obtain abdominal pelvic CT scan for evaluation.  Pain medication along with IV fluid antinausea medication given. Will recheck lactic acid.  Hold off on abx at this time as her elevated lactic acid may be 2/2 dehydration.   9:56 AM Attempted to reach out to family member via phone without success.  11:55 AM Per prior note, patient has had rectal bleeding for the past several days.  Abdominal pelvis CT scan today demonstrated long segment wall thickening and surrounding mild inflammatory changes to the terminal ileum suggesting terminal ileitis which can be seen in the setting of inflammatory bowel disease.  No evidence of small bowel obstruction however there is possibility of stricture at the proximal ileum.  Given patient continual pain, having chills,  having nausea and vomiting along with bloody stools with a hemoglobin of 8.8, and patient will benefit from hospital admission and likely transition to palliative care.  She will need to be monitored for her hemoglobin as well as transition to palliative care/skilled nursing facility.  Patient received pain medication and now appears more comfortable.  12:17 PM Appreciate consultation from [facility] Dr. Nevada Crane who agrees to admit pt for further management.  Will also obtain FOBT.   Pt's son [phone]) who is intimately aware of her health, would like to be kept updated as her husband is unable to fully appreciate her health condition and unable to communicate effectively with other family member.   BP (!) 144/57   Pulse 76   Temp (!) 97.3 F (36.3 C) (Rectal)   Resp 12   SpO2 100%   Results for orders placed or performed during the hospital encounter of 05/30/19  CBC with Differential  Result Value Ref Range   WBC 10.3 4.0 - 10.5 K/uL   RBC 2.82 (L) 3.87 - 5.11 MIL/uL   Hemoglobin 8.8 (L) 12.0 - 15.0 g/dL   HCT 26.2 (L) 36.0 - 46.0 %   MCV 92.9 80.0 - 100.0 fL   MCH 31.2 26.0 - 34.0 pg   MCHC 33.6 30.0 - 36.0 g/dL   RDW 21.0 (H) 11.5 - 15.5 %   Platelets 483 (H) 150 - 400 K/uL   nRBC 0.0 0.0 - 0.2 %   Neutrophils Relative % 87 %   Neutro Abs 9.0 (H) 1.7 - 7.7 K/uL   Lymphocytes Relative 7 %   Lymphs Abs  0.7 0.7 - 4.0 K/uL   Monocytes Relative 5 %   Monocytes Absolute 0.5 0.1 - 1.0 K/uL   Eosinophils Relative 0 %   Eosinophils Absolute 0.0 0.0 - 0.5 K/uL   Basophils Relative 0 %   Basophils Absolute 0.0 0.0 - 0.1 K/uL   Immature Granulocytes 1 %   Abs Immature Granulocytes 0.05 0.00 - 0.07 K/uL  Comprehensive metabolic panel  Result Value Ref Range   Sodium 134 (L) 135 - 145 mmol/L   Potassium 3.0 (L) 3.5 - 5.1 mmol/L   Chloride 98 98 - 111 mmol/L   CO2 18 (L) 22 - 32 mmol/L   Glucose, Bld 171 (H) 70 - 99 mg/dL   BUN 34 (H) 8 - 23 mg/dL   Creatinine, Ser 2.57  (H) 0.44 - 1.00 mg/dL   Calcium 7.8 (L) 8.9 - 10.3 mg/dL   Total Protein 7.3 6.5 - 8.1 g/dL   Albumin 2.7 (L) 3.5 - 5.0 g/dL   AST 20 15 - 41 U/L   ALT 15 0 - 44 U/L   Alkaline Phosphatase 76 38 - 126 U/L   Total Bilirubin 1.1 0.3 - 1.2 mg/dL   GFR calc non Af Amer 17 (L) >60 mL/min   GFR calc Af Amer 20 (L) >60 mL/min   Anion gap 18 (H) 5 - 15  Lipase, blood  Result Value Ref Range   Lipase 64 (H) 11 - 51 U/L  Urinalysis, Routine w reflex microscopic  Result Value Ref Range   Color, Urine YELLOW YELLOW   APPearance CLEAR CLEAR   Specific Gravity, Urine 1.005 1.005 - 1.030   pH 7.0 5.0 - 8.0   Glucose, UA NEGATIVE NEGATIVE mg/dL   Hgb urine dipstick NEGATIVE NEGATIVE   Bilirubin Urine NEGATIVE NEGATIVE   Ketones, ur 5 (A) NEGATIVE mg/dL   Protein, ur NEGATIVE NEGATIVE mg/dL   Nitrite NEGATIVE NEGATIVE   Leukocytes,Ua NEGATIVE NEGATIVE  Lactic acid, plasma  Result Value Ref Range   Lactic Acid, Venous 2.9 (HH) 0.5 - 1.9 mmol/L  Ethanol  Result Value Ref Range   Alcohol, Ethyl (B) <10 <10 mg/dL  Ammonia  Result Value Ref Range   Ammonia <9 (L) 9 - 35 umol/L  Protime-INR  Result Value Ref Range   Prothrombin Time 12.7 11.4 - 15.2 seconds   INR 1.0 0.8 - 1.2  APTT  Result Value Ref Range   aPTT 26 24 - 36 seconds  I-stat chem 8, ED (not at [facility] or [facility])  Result Value Ref Range   Sodium 135 135 - 145 mmol/L   Potassium 3.0 (L) 3.5 - 5.1 mmol/L   Chloride 99 98 - 111 mmol/L   BUN 27 (H) 8 - 23 mg/dL   Creatinine, Ser 2.40 (H) 0.44 - 1.00 mg/dL   Glucose, Bld 168 (H) 70 - 99 mg/dL   Calcium, Ion 0.94 (L) 1.15 - 1.40 mmol/L   TCO2 20 (L) 22 - 32 mmol/L   Hemoglobin 9.2 (L) 12.0 - 15.0 g/dL   HCT 27.0 (L) 36.0 - 46.0 %  POC occult blood, ED RN will collect  Result Value Ref Range   Fecal Occult Bld POSITIVE (A) NEGATIVE   Ct Abdomen Pelvis Wo Contrast  Result Date: 05/30/2019 CLINICAL DATA:  Generalized abdominal pain, nausea EXAM: CT ABDOMEN AND PELVIS WITHOUT  CONTRAST TECHNIQUE: Multidetector CT imaging of the abdomen and pelvis was performed following the standard protocol without IV contrast. COMPARISON:  04/20/2019 FINDINGS: Lower chest: No acute abnormality.  Hepatobiliary: No focal liver abnormality is seen. No gallstones, gallbladder wall thickening, or biliary dilatation. Pancreas: Unremarkable. No pancreatic ductal dilatation or surrounding inflammatory changes. Spleen: Scattered calcified granulomas within the spleen. No acute findings. Adrenals/Urinary Tract: Unremarkable adrenal glands. Bilateral renal cortical atrophy. Stable appearance of bilateral renal cysts. Extrarenal pelvis is noted bilaterally. No hydronephrosis. Ureters nondilated. Urinary bladder is moderately distended without acute abnormality. Stomach/Bowel: There is long segment wall thickening with mild surrounding fat stranding of the terminal ileum with area of caliber change in the right lower quadrant (series 2, image 49) and mild distension of the upstream ilium measuring up to 2.9 cm in diameter. Remaining small bowel is within normal limits. Scattered colonic diverticulosis. No pericolonic inflammatory changes. Small hiatal hernia. Vascular/Lymphatic: Atherosclerotic calcification of the aortoiliac axis. No abdominal or pelvic lymphadenopathy identified. Reproductive: Uterus and bilateral adnexa are unremarkable. Other: No abdominal wall hernia or abnormality. No abdominopelvic ascites. No abscess. Musculoskeletal: Grade 1 anterolisthesis L4 on L5. No acute osseous findings. IMPRESSION: 1. Long segment wall thickening and surrounding mild inflammatory changes of the terminal ileum suggesting terminal ileitis, which can be seen in the setting of inflammatory bowel disease. 2. There is focal caliber change proximally with mild distension of the more proximal ileum raising the possibility of stricture. No evidence to suggest small bowel obstruction. Electronically Signed   By: Davina Poke M.D.   On: 05/30/2019 11:25   Dg Chest 2 View  Result Date: 05/16/2019 CLINICAL DATA:  Altered mental status, left-sided deficit beginning at 11 a.m. this morning. Left shoulder pain EXAM: CHEST - 2 VIEW COMPARISON:  Numerous priors, most recent March 19, 2019 FINDINGS: Streaky bandlike areas of atelectasis are present right mid lung and left lung base. Postsurgical changes are present the left lung. The lungs are otherwise clear. No pneumothorax. No effusions. Stable appearance of the cardiomediastinal contours including rightward deviation of the trachea. Surgical clips project over the left axilla. No acute osseous or soft tissue abnormality. Postthoracotomy changes with prior left rib resection and remote rib fractures. Surgical clips project in the left axilla. IMPRESSION: Bilateral atelectasis. No acute cardiopulmonary disease. Electronically Signed   By: Lovena Le M.D.   On: 05/16/2019 16:12   Ct Head Wo Contrast  Result Date: 05/30/2019 CLINICAL DATA:  Altered level of consciousness EXAM: CT HEAD WITHOUT CONTRAST CT CERVICAL SPINE WITHOUT CONTRAST TECHNIQUE: Multidetector CT imaging of the head and cervical spine was performed following the standard protocol without intravenous contrast. Multiplanar CT image reconstructions of the cervical spine were also generated. COMPARISON:  05/24/2019 FINDINGS: CT HEAD FINDINGS Brain: No evidence of acute infarction, hemorrhage, extra-axial collection, ventriculomegaly, or mass effect. Generalized cerebral atrophy. Periventricular white matter low attenuation likely secondary to microangiopathy. Vascular: Cerebrovascular atherosclerotic calcifications are noted. Skull: Negative for fracture or focal lesion. Sinuses/Orbits: Visualized portions of the orbits are unremarkable. Visualized portions of the paranasal sinuses and mastoid air cells are unremarkable. Other: Scalp contusion along the left vertex. CT CERVICAL SPINE FINDINGS Alignment: Normal. Skull  base and vertebrae: No acute fracture. No primary bone lesion or focal pathologic process. Erosion along the anterior aspect of the dens as can be seen with crystalline arthropathy. Soft tissues and spinal canal: No prevertebral fluid or swelling. No visible canal hematoma. Disc levels: Degenerative disc disease with disc height loss at C4-5 and C5-6. Right uncovertebral degenerative changes at C4-5 and C5-6 resulting in foraminal stenosis. Mild left foraminal stenosis at C4-5. Upper chest: Left apical scarring. Other: Bilateral carotid artery atherosclerosis.  IMPRESSION: 1. No acute intracranial pathology. 2.  No acute osseous injury of the cervical spine. Electronically Signed   By: Kathreen Devoid   On: 05/30/2019 07:33   Ct Head Wo Contrast  Result Date: 05/24/2019 CLINICAL DATA:  Worsening headaches. Recent falls. Nausea and emesis today. EXAM: CT HEAD WITHOUT CONTRAST TECHNIQUE: Contiguous axial images were obtained from the base of the skull through the vertex without intravenous contrast. COMPARISON:  Head CT 05/16/2019 FINDINGS: Brain: No intracranial hemorrhage, mass effect, or midline shift. Unchanged degree of atrophy and chronic small vessel ischemia. No hydrocephalus. The basilar cisterns are patent. No evidence of territorial infarct or acute ischemia. No extra-axial or intracranial fluid collection. Vascular: Atherosclerosis of skullbase vasculature without hyperdense vessel or abnormal calcification. Skull: No fracture or focal lesion. Sinuses/Orbits: Minor mucosal thickening of ethmoid air cells. No acute findings. No sinus fluid level. Bilateral lens extraction. Other: None. IMPRESSION: 1. No acute intracranial abnormality. 2. Unchanged atrophy and chronic small vessel ischemia. Electronically Signed   By: Keith Rake M.D.   On: 05/24/2019 16:48   Ct Head Wo Contrast  Result Date: 05/16/2019 CLINICAL DATA:  TIA, multiple falls, hit back of head EXAM: CT HEAD WITHOUT CONTRAST TECHNIQUE:  Contiguous axial images were obtained from the base of the skull through the vertex without intravenous contrast. COMPARISON:  CT brain 05/01/2019 FINDINGS: Brain: No acute territorial infarction, hemorrhage or intracranial mass. Atrophy and minimal small vessel ischemic change of the white matter. Stable ventricle size. Vascular: No hyperdense vessels. Vertebral and carotid vascular calcification Skull: Normal. Negative for fracture or focal lesion. Sinuses/Orbits: No acute finding. Mild mucosal thickening in the maxillary and ethmoid sinuses Other: None IMPRESSION: 1. No CT evidence for acute intracranial abnormality. 2. Atrophy and minimal small vessel ischemic changes of the white matter Electronically Signed   By: Donavan Foil M.D.   On: 05/16/2019 17:49   Ct Head Wo Contrast  Addendum Date: 05/01/2019   ADDENDUM REPORT: 05/01/2019 11:20 ADDENDUM: The coronal and sagittal recons of the head have been repeated and now include the whole head. No skull fracture, intracranial hemorrhage or other acute abnormality is seen. Electronically Signed   By: Claudie Revering M.D.   On: 05/01/2019 11:20   Result Date: 05/01/2019 CLINICAL DATA:  Posterior headache following a fall last week. EXAM: CT HEAD WITHOUT CONTRAST CT CERVICAL SPINE WITHOUT CONTRAST TECHNIQUE: Multidetector CT imaging of the head and cervical spine was performed following the standard protocol without intravenous contrast. Multiplanar CT image reconstructions of the cervical spine were also generated. COMPARISON:  Head CT dated 04/07/2019 and cervical spine MR dated 08/04/2018. FINDINGS: CT HEAD FINDINGS Brain: Stable mildly enlarged ventricles and cortical sulci. Stable minimal patchy white matter low density in both cerebral hemispheres. No intracranial hemorrhage, mass lesion or CT evidence of acute infarction. Vascular: No hyperdense vessel or unexpected calcification. Skull: Normal. Negative for fracture or focal lesion. Sinuses/Orbits: Status  post bilateral cataract extraction. Unremarkable bones and included paranasal sinuses. Other: None. CT CERVICAL SPINE FINDINGS Alignment: Minimal anterolisthesis at the C4-5 level and minimal retrolisthesis at the C5-6 level. Skull base and vertebrae: No acute fracture. No primary bone lesion or focal pathologic process. Soft tissues and spinal canal: No prevertebral fluid or swelling. No visible canal hematoma. Disc levels: Multilevel degenerative changes, including facet degenerative changes. Upper chest: Mild left apical parenchymal scarring. Other: Bilateral carotid artery calcifications. IMPRESSION: 1. No skull fracture or intracranial hemorrhage. 2. No cervical spine fracture or traumatic subluxation. 3. Stable mild  diffuse cerebral and cerebellar atrophy and minimal chronic small vessel white matter ischemic changes in both cerebral hemispheres. 4. Multilevel cervical spine degenerative changes. 5. Bilateral carotid artery atheromatous calcifications. Note: The back and top of the head were not included on the head CT sagittal and coronal recons. A report addendum will be issued once these are available. Electronically Signed: By: Claudie Revering M.D. On: 05/01/2019 10:49   Ct Cervical Spine Wo Contrast  Result Date: 05/30/2019 CLINICAL DATA:  Altered level of consciousness EXAM: CT HEAD WITHOUT CONTRAST CT CERVICAL SPINE WITHOUT CONTRAST TECHNIQUE: Multidetector CT imaging of the head and cervical spine was performed following the standard protocol without intravenous contrast. Multiplanar CT image reconstructions of the cervical spine were also generated. COMPARISON:  05/24/2019 FINDINGS: CT HEAD FINDINGS Brain: No evidence of acute infarction, hemorrhage, extra-axial collection, ventriculomegaly, or mass effect. Generalized cerebral atrophy. Periventricular white matter low attenuation likely secondary to microangiopathy. Vascular: Cerebrovascular atherosclerotic calcifications are noted. Skull: Negative  for fracture or focal lesion. Sinuses/Orbits: Visualized portions of the orbits are unremarkable. Visualized portions of the paranasal sinuses and mastoid air cells are unremarkable. Other: Scalp contusion along the left vertex. CT CERVICAL SPINE FINDINGS Alignment: Normal. Skull base and vertebrae: No acute fracture. No primary bone lesion or focal pathologic process. Erosion along the anterior aspect of the dens as can be seen with crystalline arthropathy. Soft tissues and spinal canal: No prevertebral fluid or swelling. No visible canal hematoma. Disc levels: Degenerative disc disease with disc height loss at C4-5 and C5-6. Right uncovertebral degenerative changes at C4-5 and C5-6 resulting in foraminal stenosis. Mild left foraminal stenosis at C4-5. Upper chest: Left apical scarring. Other: Bilateral carotid artery atherosclerosis. IMPRESSION: 1. No acute intracranial pathology. 2.  No acute osseous injury of the cervical spine. Electronically Signed   By: Kathreen Devoid   On: 05/30/2019 07:33   Ct Cervical Spine Wo Contrast  Addendum Date: 05/01/2019   ADDENDUM REPORT: 05/01/2019 11:20 ADDENDUM: The coronal and sagittal recons of the head have been repeated and now include the whole head. No skull fracture, intracranial hemorrhage or other acute abnormality is seen. Electronically Signed   By: Claudie Revering M.D.   On: 05/01/2019 11:20   Result Date: 05/01/2019 CLINICAL DATA:  Posterior headache following a fall last week. EXAM: CT HEAD WITHOUT CONTRAST CT CERVICAL SPINE WITHOUT CONTRAST TECHNIQUE: Multidetector CT imaging of the head and cervical spine was performed following the standard protocol without intravenous contrast. Multiplanar CT image reconstructions of the cervical spine were also generated. COMPARISON:  Head CT dated 04/07/2019 and cervical spine MR dated 08/04/2018. FINDINGS: CT HEAD FINDINGS Brain: Stable mildly enlarged ventricles and cortical sulci. Stable minimal patchy white matter low  density in both cerebral hemispheres. No intracranial hemorrhage, mass lesion or CT evidence of acute infarction. Vascular: No hyperdense vessel or unexpected calcification. Skull: Normal. Negative for fracture or focal lesion. Sinuses/Orbits: Status post bilateral cataract extraction. Unremarkable bones and included paranasal sinuses. Other: None. CT CERVICAL SPINE FINDINGS Alignment: Minimal anterolisthesis at the C4-5 level and minimal retrolisthesis at the C5-6 level. Skull base and vertebrae: No acute fracture. No primary bone lesion or focal pathologic process. Soft tissues and spinal canal: No prevertebral fluid or swelling. No visible canal hematoma. Disc levels: Multilevel degenerative changes, including facet degenerative changes. Upper chest: Mild left apical parenchymal scarring. Other: Bilateral carotid artery calcifications. IMPRESSION: 1. No skull fracture or intracranial hemorrhage. 2. No cervical spine fracture or traumatic subluxation. 3. Stable mild diffuse cerebral  and cerebellar atrophy and minimal chronic small vessel white matter ischemic changes in both cerebral hemispheres. 4. Multilevel cervical spine degenerative changes. 5. Bilateral carotid artery atheromatous calcifications. Note: The back and top of the head were not included on the head CT sagittal and coronal recons. A report addendum will be issued once these are available. Electronically Signed: By: Claudie Revering M.D. On: 05/01/2019 10:49   Dg Chest Portable 1 View  Result Date: 05/30/2019 CLINICAL DATA:  Altered mental status EXAM: PORTABLE CHEST 1 VIEW COMPARISON:  05/16/2019 FINDINGS: The heart size and mediastinal contours are within normal limits. Both lungs are clear. Old left posterior rib fractures. Surgical clips in the left axilla. IMPRESSION: No active disease. Electronically Signed   By: Kathreen Devoid   On: 05/30/2019 06:57   Dg Shoulder Left  Result Date: 05/16/2019 CLINICAL DATA:  Altered mental status,  left-sided deficits, left shoulder pain EXAM: LEFT SHOULDER - 2+ VIEW COMPARISON:  None. FINDINGS: No evidence of fracture or traumatic malalignment. Degenerative changes of the acromioclavicular and lesser changes at the glenohumeral joint. Coracoclavicular and acromioclavicular intervals are maintained. Postsurgical changes are noted in the left axilla as well as postthoracotomy changes prior rib resection and several remote deformities of the left-sided ribs as well as suture material within the left lung. The aorta is calcified. IMPRESSION: 1. No acute osseous abnormality. 2. Mild acromioclavicular and glenohumeral degenerative changes. 3. Post thoracotomy changes left chest wall. Postsurgical changes left axilla. Electronically Signed   By: Lovena Le M.D.   On: 05/16/2019 16:13      Domenic Moras, PA-C 05/30/19 1225    Domenic Moras, PA-C 05/30/19 1225    Domenic Moras, PA-C 05/30/19 1300    Davonna Belling, MD 05/30/19 [phone]

## 2019-05-30 NOTE — ED Notes (Signed)
Pt assisted out of bed to bed side commode to urinate.

## 2019-05-30 NOTE — Patient Outreach (Signed)
[facility] [facility]) Care Management  05/30/2019  Samantha Mckenzie Feb 09, 1936 [id number]    RN received a call from the pt's son [name]) who inquired about pt receiving a call for 24/7 nursing. RN has introduced the service is always available with different agencies however may inquire a fee for service based upon the agency's criteria however [facility] did not offer this services and would not know what agency may be in contact with the pt. Son also informed RN that the pt is currently in the ED.   Note son has opt to decline [facility] services recently has not attempted to pursue at this time based upon pt's again neglect to participate. Therefore RN would be alerted of pt's ED or hospitalized visits.  No further inquires or request at this time.  [name], RN Care Management Coordinator [place] Office [phone]

## 2019-05-30 NOTE — ED Notes (Signed)
RN attempted to call pt's husband to update on pt's condition with no answer

## 2019-05-30 NOTE — ED Notes (Signed)
RN attempting to get patient to sip after her potassium. Pt refusing to drink and stating she does not want to drink.  Pt is continuously grunting and will only selectively answer questions. This RN asked patient if she wanted to get better and pt shook her head and told this RN to "Leave me alone, i'm cold" Patient in bed with the side rails up. Pt whimpering that she is tired and her stomach hurts.

## 2019-05-30 NOTE — ED Notes (Signed)
RN and NT repositioned and changed patient.Pt had a small BM that was hard in appearance.  Pt has an area of redness on her bottom that appears to be a stage 1 pressure sore. Redness is over the sacral area and does barely blanches when pressed.  RN cleaned pt's bottom and purewick applied to reduce moisture on patient's skin during episodes of incontinence

## 2019-05-30 NOTE — ED Triage Notes (Addendum)
Arrives via EMS from home. Patient complains of nausea, has not thrown up. Refusing to answer questions with EMS, patient has been grunting and moaning. CBG 224

## 2019-05-31 DIAGNOSIS — E872 Acidosis: Secondary | ICD-10-CM

## 2019-05-31 DIAGNOSIS — E860 Dehydration: Secondary | ICD-10-CM

## 2019-05-31 DIAGNOSIS — R45851 Suicidal ideations: Secondary | ICD-10-CM

## 2019-05-31 DIAGNOSIS — N179 Acute kidney failure, unspecified: Secondary | ICD-10-CM

## 2019-05-31 DIAGNOSIS — N184 Chronic kidney disease, stage 4 (severe): Secondary | ICD-10-CM

## 2019-05-31 DIAGNOSIS — E876 Hypokalemia: Secondary | ICD-10-CM

## 2019-05-31 DIAGNOSIS — D62 Acute posthemorrhagic anemia: Secondary | ICD-10-CM

## 2019-05-31 LAB — PREPARE RBC (CROSSMATCH)

## 2019-05-31 LAB — COMPREHENSIVE METABOLIC PANEL
ALT: 11 U/L (ref 0–44)
AST: 13 U/L — ABNORMAL LOW (ref 15–41)
Albumin: 2.1 g/dL — ABNORMAL LOW (ref 3.5–5.0)
Alkaline Phosphatase: 62 U/L (ref 38–126)
Anion gap: 10 (ref 5–15)
BUN: 27 mg/dL — ABNORMAL HIGH (ref 8–23)
CO2: 32 mmol/L (ref 22–32)
Calcium: 7 mg/dL — ABNORMAL LOW (ref 8.9–10.3)
Chloride: 97 mmol/L — ABNORMAL LOW (ref 98–111)
Creatinine, Ser: 2.14 mg/dL — ABNORMAL HIGH (ref 0.44–1.00)
GFR calc Af Amer: 25 mL/min — ABNORMAL LOW (ref >60)
GFR calc non Af Amer: 21 mL/min — ABNORMAL LOW (ref >60)
Glucose, Bld: 90 mg/dL (ref 70–99)
Potassium: 2.9 mmol/L — ABNORMAL LOW (ref 3.5–5.1)
Sodium: 139 mmol/L (ref 135–145)
Total Bilirubin: 0.5 mg/dL (ref 0.3–1.2)
Total Protein: 5.6 g/dL — ABNORMAL LOW (ref 6.5–8.1)

## 2019-05-31 LAB — CBC WITH DIFFERENTIAL/PLATELET
Abs Immature Granulocytes: 0.01 10*3/uL (ref 0.00–0.07)
Basophils Absolute: 0 10*3/uL (ref 0.0–0.1)
Basophils Relative: 0 %
Eosinophils Absolute: 0.1 10*3/uL (ref 0.0–0.5)
Eosinophils Relative: 2 %
HCT: 21.4 % — ABNORMAL LOW (ref 36.0–46.0)
Hemoglobin: 6.8 g/dL — CL (ref 12.0–15.0)
Immature Granulocytes: 0 %
Lymphocytes Relative: 26 %
Lymphs Abs: 1.6 10*3/uL (ref 0.7–4.0)
MCH: 30.9 pg (ref 26.0–34.0)
MCHC: 31.8 g/dL (ref 30.0–36.0)
MCV: 97.3 fL (ref 80.0–100.0)
Monocytes Absolute: 0.5 10*3/uL (ref 0.1–1.0)
Monocytes Relative: 8 %
Neutro Abs: 4 10*3/uL (ref 1.7–7.7)
Neutrophils Relative %: 64 %
Platelets: 366 10*3/uL (ref 150–400)
RBC: 2.2 MIL/uL — ABNORMAL LOW (ref 3.87–5.11)
RDW: 21.7 % — ABNORMAL HIGH (ref 11.5–15.5)
WBC: 6.1 10*3/uL (ref 4.0–10.5)
nRBC: 0 % (ref 0.0–0.2)

## 2019-05-31 LAB — GLUCOSE, CAPILLARY
Glucose-Capillary: 90 mg/dL (ref 70–99)
Glucose-Capillary: 83 mg/dL (ref 70–99)
Glucose-Capillary: 86 mg/dL (ref 70–99)
Glucose-Capillary: 73 mg/dL (ref 70–99)

## 2019-05-31 LAB — LIPASE, BLOOD: Lipase: 53 U/L — ABNORMAL HIGH (ref 11–51)

## 2019-05-31 LAB — HEMOGLOBIN AND HEMATOCRIT, BLOOD
HCT: 33.3 % — ABNORMAL LOW (ref 36.0–46.0)
Hemoglobin: 10.8 g/dL — ABNORMAL LOW (ref 12.0–15.0)

## 2019-05-31 LAB — MAGNESIUM: Magnesium: 1.9 mg/dL (ref 1.7–2.4)

## 2019-05-31 MED ORDER — POTASSIUM CHLORIDE CRYS ER 20 MEQ PO TBCR
40.0000 meq | EXTENDED_RELEASE_TABLET | Freq: Once | ORAL | Status: AC
  Administered 2019-05-31: 40 meq via ORAL
  Filled 2019-05-31: qty 2

## 2019-05-31 MED ORDER — POTASSIUM CHLORIDE 10 MEQ/100ML IV SOLN
10.0000 meq | INTRAVENOUS | Status: AC
  Administered 2019-05-31 (×3): 10 meq via INTRAVENOUS
  Filled 2019-05-31 (×3): qty 100

## 2019-05-31 MED ORDER — SODIUM CHLORIDE 0.9% IV SOLUTION
Freq: Once | INTRAVENOUS | Status: AC
  Administered 2019-05-31: 15:00:00 via INTRAVENOUS

## 2019-05-31 NOTE — TOC Initial Note (Signed)
Transition of Care [facility]) - Initial/Assessment Note    Patient Details  Name: MARIBEL HADLEY MRN: [id number] Date of Birth: 1936-10-06  [facility]) CM/SW Contact:    Nila Nephew, LCSW Phone Number: [phone] 05/31/2019, 10:51 AM  Clinical Narrative:           Completed high readmission risk screening  Due to score 48%. Admitted with ileitis, GI bleed, dehydration. From home with family and is active with [facility] for [facility] and PT. Was scheduled to begin [place] as well but did not begin PTA.       Patient Goals and CMS Choice        Expected Discharge Plan and Services           Expected Discharge Date: (unknown)                                    Prior Living Arrangements/Services                       Activities of Daily Living Home Assistive Devices/Equipment: Eyeglasses, Environmental consultant (specify type), Other (Comment), CBG Meter, Nebulizer(2 and 4 wheeled walker, walk-in shower) ADL Screening (condition at time of admission) Patient's cognitive ability adequate to safely complete daily activities?: No Is the patient deaf or have difficulty hearing?: Yes(very hoh) Does the patient have difficulty seeing, even when wearing glasses/contacts?: Yes(patient has macular degeneration) Does the patient have difficulty concentrating, remembering, or making decisions?: Yes Patient able to express need for assistance with ADLs?: Yes Does the patient have difficulty dressing or bathing?: Yes Independently performs ADLs?: No Communication: Independent Dressing (OT): Needs assistance Is this a change from baseline?: Pre-admission baseline Grooming: Needs assistance Is this a change from baseline?: Pre-admission baseline Feeding: Needs assistance Is this a change from baseline?: Pre-admission baseline Bathing: Needs assistance Is this a change from baseline?: Pre-admission baseline Toileting: Needs assistance Is this a change from baseline?:  Pre-admission baseline In/Out Bed: Dependent, Needs assistance Is this a change from baseline?: Pre-admission baseline Walks in Home: Dependent, Needs assistance Is this a change from baseline?: Pre-admission baseline Does the patient have difficulty walking or climbing stairs?: Yes(secondary to weakness) Weakness of Legs: Both Weakness of Arms/Hands: Both  Permission Sought/Granted                  Emotional Assessment              Admission diagnosis:  Dehydration [E86.0] Terminal ileitis with complication ([place]) [D63.875] Patient Active Problem List   Diagnosis Date Noted  . Ileitis 05/30/2019  . Ileitis, terminal ([place]) 04/22/2019  . COPD (chronic obstructive pulmonary disease) ([place]) 04/12/2019  . Enteritis 04/07/2019  . Chronic diastolic CHF (congestive heart failure) ([place]) 04/07/2019  . Hypokalemia 04/07/2019  . Hypocalcemia 04/07/2019  . Fall 04/07/2019  . Occult blood positive stool 04/07/2019  . Adjustment disorder with depressed mood   . Generalized abdominal pain   . Dysphasia 03/21/2019  . CAP (community acquired pneumonia) 03/20/2019  . Nausea vomiting and diarrhea 03/20/2019  . Acute respiratory failure with hypoxia ([place]) 03/20/2019  . Acute renal failure superimposed on stage 4 chronic kidney disease ([place]) 03/20/2019  . Lactic acidosis 03/20/2019  . Diabetic peripheral neuropathy associated with type 2 diabetes mellitus ([place]) 03/20/2019  . Hypomagnesemia 06/21/2018  . Generalized weakness 06/20/2018  . PCP NOTES >>> 07/11/2015  . Depression   . VITAMIN B12  DEFICIENCY 04/01/2008  . GERD 12/19/2007  . NEOP, MALIGNANT, FEMALE BREAST NOS 07/07/2007  . Osteoarthritis 07/07/2007  . Diabetes mellitus type 2 with complications ([place]) 23/34/3568  . Hyperlipidemia 02/28/2007  . GOUT 02/28/2007  . Normocytic anemia 02/28/2007  . Essential hypertension 02/28/2007   PCP:  Colon Branch, MD Pharmacy:   [facility], [place] [address] [place] [address] Phone: [phone] Fax: [facility] [address], [place]. [place]. [place] [address] Phone: [phone] Fax: [phone]     Social Determinants of Health (SDOH) Interventions    Readmission Risk Interventions Readmission Risk Prevention Plan 05/31/2019 04/10/2019 03/26/2019  Transportation Screening Complete Complete Complete  PCP or Specialist Appt within 3-5 Days - - Complete  [place]RI or [place] - - Complete  Social Work Consult for [place] Planning/Counseling - - Complete  Palliative Care Screening - - Not Applicable  Medication Review (RN Care Manager) Referral to Pharmacy Referral to Pharmacy Complete  PCP or Specialist appointment within 3-5 days of discharge Complete Complete -  [place] or Home Care Consult Complete Complete -  SW Recovery Care/Counseling Consult Complete Complete -  Palliative Care Screening Not Applicable Not Applicable -  [place] Not Applicable Not Applicable -  Some recent data might be hidden

## 2019-05-31 NOTE — Progress Notes (Signed)
CRITICAL VALUE ALERT  Critical Value:  Hgb 6.8  Date & Time Notied:  05/31/19  Provider Notified: Clementeen Graham, MD  Orders Received/Actions taken: Awaiting orders

## 2019-05-31 NOTE — Progress Notes (Signed)
VAST consulted to place 2nd IV access as pt is getting runs of potassium and also needs blood. Pt's left arm is restricted d/t history of breast cancer with lymph node removal. Lower right arm assessed utilizing [place]. No appropriate veins to be stuck. Advised pt's nurse that no access obtained and priority of meds vs blood needs to be utilized. Nancy Fetter, RN verbalized understanding.

## 2019-05-31 NOTE — Consult Note (Signed)
Telepsych Consultation   Reason for Consult:  Depression and capacity evaluation  Referring Physician:  Dr. Louellen Molder Location of Patient: [facility] Location of Provider: [facility]  Patient Identification: Samantha Mckenzie MRN:  [id number] Principal Diagnosis: Depression Diagnosis:  Active Problems:   Ileitis   Total Time spent with patient: 1 hour  Subjective:   Samantha Mckenzie is a 83 y.o. female patient admitted with GI symptoms.  HPI:   Per chart review, patient was admitted with GI symptoms including nausea, vomiting and diarrhea. She was seen by GI who suspect that her symptoms could represent IBD. Primary team suspects terminal ileitis. Psychiatry was consulted for depression and capacity evaluation. Patient has endorsed depression and a desire to die. Her PCP recently increased Lexapro. He is concerned for failure to thrive and dementia. She has frequent falls at home and has not been adhering to her medications. She has been admitted to two SNFs and left both prior to her actual discharge.   Of note, patient was last seen by the psychiatry consult service on 6/15 for SI in the setting of ongoing medical problems. She denied SI on interview and reported improvement in her GI symptoms. She was psychiatrically cleared.   On interview, Samantha Mckenzie initially reports that she is in the hospital for seizures. She is asked about her GI symptoms and agrees to intermittent GI symptoms that have recently worsened. She reports that Dr. Larose Kells increased Lexapro 3 weeks ago for depression. It appears that a prescription was sent to her pharmacy on 8/10 for the increased dose. She reports that her mood fluctuates with GI pain. She denies SI or any intention to harm self. She denies HI or AVH. She sleeps well. She reports poor appetite due to GI upset. She reports compliance with her medications but reports that her husband helps administer them. She is unable to name her medical  conditions on her own but does agree to having medical conditions as they are reviewed with her. She reports taking medications for her blood pressure. She reports unsteady gait at times. She reports a history of falls because she does not pay attention when walking. She denies a history of head injuries but does report injuring her elbow from falling in the past. She reports needing assistance with IADLs such as cleaning.   Past Psychiatric History: Depression   Risk to Self:  None. Denies SI.  Risk to Others:  None. Denies HI.  Prior Inpatient Therapy:  Denies  Prior Outpatient Therapy:   She is followed by Dr. Larose Kells.   Past Medical History:  Past Medical History:  Diagnosis Date  . Allergic rhinitis   . Anemia   . Breast CA ([place])    surgery, chemo, XRT; had peripheral neuropathy (imbalance at times) after chemo  . Cellulitis    Left arm, recurrent   . Cellulitis LEFT arm recurrent 12/08/2011  . Chronic renal disease, stage IV (HCC)    Dr. Moshe Cipro  . Depression   . Fatigue   . GERD (gastroesophageal reflux disease)    gastritis, EGD 02/2007  . Gout   . Hyperlipidemia   . Hypertension   . Macular degeneration 2011  . Normal cardiac stress test 12/2014  . Osteoarthritis   . Renal insufficiency     Past Surgical History:  Procedure Laterality Date  . cataracts bilaterally  1 -2012  . LUNG BIOPSY  1999   neg  . MASTECTOMY Left 1999   and lymphnodes  Family History:  Family History  Problem Relation Age of Onset  . Breast cancer Other        ? of   . Heart attack Father 39  . Diabetes Sister        2  . Hypertension Sister   . Colon cancer Neg Hx   . Stomach cancer Neg Hx    Family Psychiatric  History: Denies  Social History:  Social History   Substance and Sexual Activity  Alcohol Use Not Currently  . Alcohol/week: 0.0 standard drinks   Comment: rarely     Social History   Substance and Sexual Activity  Drug Use No    Social History    Socioeconomic History  . Marital status: Married    Spouse name: Not on file  . Number of children: 2  . Years of education: Not on file  . Highest education level: Not on file  Occupational History  . Occupation: retired     Fish farm manager: RETIRED  Social Needs  . Financial resource strain: Not on file  . Food insecurity    Worry: Not on file    Inability: Not on file  . Transportation needs    Medical: Not on file    Non-medical: Not on file  Tobacco Use  . Smoking status: Never Smoker  . Smokeless tobacco: Never Used  Substance and Sexual Activity  . Alcohol use: Not Currently    Alcohol/week: 0.0 standard drinks    Comment: rarely  . Drug use: No  . Sexual activity: Not Currently  Lifestyle  . Physical activity    Days per week: Not on file    Minutes per session: Not on file  . Stress: Not on file  Relationships  . Social Herbalist on phone: Not on file    Gets together: Not on file    Attends religious service: Not on file    Active member of club or organization: Not on file    Attends meetings of clubs or organizations: Not on file    Relationship status: Not on file  Other Topics Concern  . Not on file  Social History Narrative   Lives w/ husband , 2 children in [place], 2 [place]           Additional Social History: She lives at home with her husband. She has 2 adult sons. She denies illicit substance use or alcohol use.     Allergies:   Allergies  Allergen Reactions  . Codeine Nausea And Vomiting  . Morphine Nausea And Vomiting  . Oxycodone-Acetaminophen Nausea And Vomiting  . Sulfonamide Derivatives Nausea And Vomiting  . Tramadol Hcl Nausea And Vomiting    Labs:  Results for orders placed or performed during the hospital encounter of 05/30/19 (from the past 48 hour(s))  CBC with Differential     Status: Abnormal   Collection Time: 05/30/19  6:16 AM  Result Value Ref Range   WBC 10.3 4.0 - 10.5 K/uL   RBC 2.82 (L) 3.87 - 5.11 MIL/uL    Hemoglobin 8.8 (L) 12.0 - 15.0 g/dL   HCT 26.2 (L) 36.0 - 46.0 %   MCV 92.9 80.0 - 100.0 fL   MCH 31.2 26.0 - 34.0 pg   MCHC 33.6 30.0 - 36.0 g/dL   RDW 21.0 (H) 11.5 - 15.5 %   Platelets 483 (H) 150 - 400 K/uL   nRBC 0.0 0.0 - 0.2 %   Neutrophils Relative % 87 %  Neutro Abs 9.0 (H) 1.7 - 7.7 K/uL   Lymphocytes Relative 7 %   Lymphs Abs 0.7 0.7 - 4.0 K/uL   Monocytes Relative 5 %   Monocytes Absolute 0.5 0.1 - 1.0 K/uL   Eosinophils Relative 0 %   Eosinophils Absolute 0.0 0.0 - 0.5 K/uL   Basophils Relative 0 %   Basophils Absolute 0.0 0.0 - 0.1 K/uL   Immature Granulocytes 1 %   Abs Immature Granulocytes 0.05 0.00 - 0.07 K/uL    Comment: Performed at [facility], [place] [address] [place], [place] [address]  Comprehensive metabolic panel     Status: Abnormal   Collection Time: 05/30/19  6:16 AM  Result Value Ref Range   Sodium 134 (L) 135 - 145 mmol/L   Potassium 3.0 (L) 3.5 - 5.1 mmol/L   Chloride 98 98 - 111 mmol/L   CO2 18 (L) 22 - 32 mmol/L   Glucose, Bld 171 (H) 70 - 99 mg/dL   BUN 34 (H) 8 - 23 mg/dL   Creatinine, Ser 2.57 (H) 0.44 - 1.00 mg/dL   Calcium 7.8 (L) 8.9 - 10.3 mg/dL   Total Protein 7.3 6.5 - 8.1 g/dL   Albumin 2.7 (L) 3.5 - 5.0 g/dL   AST 20 15 - 41 U/L   ALT 15 0 - 44 U/L   Alkaline Phosphatase 76 38 - 126 U/L   Total Bilirubin 1.1 0.3 - 1.2 mg/dL   GFR calc non Af Amer 17 (L) >60 mL/min   GFR calc Af Amer 20 (L) >60 mL/min   Anion gap 18 (H) 5 - 15    Comment: Performed at [facility], [place] [address] [place], [place] [address]  Lipase, blood     Status: Abnormal   Collection Time: 05/30/19  6:16 AM  Result Value Ref Range   Lipase 64 (H) 11 - 51 U/L    Comment: Performed at [facility], [place] [address], [place], [place] [address]  Blood culture (routine x 2)     Status: None (Preliminary result)   Collection Time: 05/30/19  6:16 AM   Specimen: BLOOD  Result Value Ref Range   Specimen  Description      BLOOD BLOOD RIGHT FOREARM Performed at [place] [address] [place], [place] [address]    Special Requests      BOTTLES DRAWN AEROBIC ONLY Blood Culture adequate volume Performed at [place] [address], [place], [place] [address]    Culture      NO GROWTH < 24 HOURS Performed at [place] [address], [place], [place] [address]    Report Status PENDING   Protime-INR     Status: None   Collection Time: 05/30/19  6:16 AM  Result Value Ref Range   Prothrombin Time 12.7 11.4 - 15.2 seconds   INR 1.0 0.8 - 1.2    Comment: (NOTE) INR goal varies based on device and disease states. Performed at [facility], [place] [address] [place], [place] [address]   APTT     Status: None   Collection Time: 05/30/19  6:16 AM  Result Value Ref Range   aPTT 26 24 - 36 seconds    Comment: Performed at [facility], [place] [address], [place], [place] [address]  Hemoglobin A1c     Status: None   Collection Time: 05/30/19  6:16 AM  Result Value Ref Range  Hgb A1c MFr Bld 5.2 4.8 - 5.6 %    Comment: (NOTE) Pre diabetes:          5.7%-6.4% Diabetes:              >6.4% Glycemic control for   <7.0% adults with diabetes    Mean Plasma Glucose 102.54 mg/dL    Comment: Performed at [place] [address], [place], [place] [address]  Lactic acid, plasma     Status: Abnormal   Collection Time: 05/30/19  6:17 AM  Result Value Ref Range   Lactic Acid, Venous 2.9 (HH) 0.5 - 1.9 mmol/L    Comment: CRITICAL RESULT CALLED TO, READ BACK BY AND VERIFIED WITH: Matton,T @ 2841 ON [id number] BY POTEAT,S Performed at [place] [address], [place], [place] [address]   Ethanol     Status: None   Collection Time: 05/30/19  6:18 AM  Result Value Ref Range   Alcohol, Ethyl (B) <10 <10 mg/dL    Comment: (NOTE) Lowest detectable limit for serum alcohol is 10  mg/dL. For medical purposes only. Performed at [facility], [place] [address] [place], [place] [address]   Ammonia     Status: Abnormal   Collection Time: 05/30/19  6:18 AM  Result Value Ref Range   Ammonia <9 (L) 9 - 35 umol/L    Comment: Performed at [facility] [place] [address] [place], [place] [address]  Blood culture (routine x 2)     Status: None (Preliminary result)   Collection Time: 05/30/19  6:44 AM   Specimen: BLOOD  Result Value Ref Range   Specimen Description      BLOOD LEFT ANTECUBITAL Performed at [facility], [place] [address] [place], [place] [address]    Special Requests      BOTTLES DRAWN AEROBIC AND ANAEROBIC Blood Culture results may not be optimal due to an excessive volume of blood received in culture bottles Performed at [place] [address] [place], [place] [address]    Culture      NO GROWTH < 24 HOURS Performed at [place] [address], [place], [place] [address]    Report Status PENDING   Urinalysis, Routine w reflex microscopic     Status: Abnormal   Collection Time: 05/30/19  6:53 AM  Result Value Ref Range   Color, Urine YELLOW YELLOW   APPearance CLEAR CLEAR   Specific Gravity, Urine 1.005 1.005 - 1.030   pH 7.0 5.0 - 8.0   Glucose, UA NEGATIVE NEGATIVE mg/dL   Hgb urine dipstick NEGATIVE NEGATIVE   Bilirubin Urine NEGATIVE NEGATIVE   Ketones, ur 5 (A) NEGATIVE mg/dL   Protein, ur NEGATIVE NEGATIVE mg/dL   Nitrite NEGATIVE NEGATIVE   Leukocytes,Ua NEGATIVE NEGATIVE    Comment: Performed at [place] [address], [place], [place] [address]  I-stat chem 8, ED (not at [facility] or [facility])     Status: Abnormal   Collection Time: 05/30/19  6:54 AM  Result Value Ref Range   Sodium 135 135 - 145 mmol/L   Potassium 3.0 (L) 3.5 - 5.1 mmol/L   Chloride 99 98 - 111 mmol/L   BUN 27 (H) 8 - 23 mg/dL   Creatinine, Ser 2.40 (H) 0.44 -  1.00 mg/dL   Glucose, Bld 168 (H) 70 - 99 mg/dL   Calcium, Ion 0.94 (L) 1.15 - 1.40 mmol/L   TCO2  20 (L) 22 - 32 mmol/L   Hemoglobin 9.2 (L) 12.0 - 15.0 g/dL   HCT 27.0 (L) 36.0 - 46.0 %  SARS Coronavirus 2 [facility] order, Performed in [facility] hospital lab) Nasopharyngeal Nasopharyngeal Swab     Status: None   Collection Time: 05/30/19 12:16 PM   Specimen: Nasopharyngeal Swab  Result Value Ref Range   SARS Coronavirus 2 NEGATIVE NEGATIVE    Comment: (NOTE) If result is NEGATIVE SARS-CoV-2 target nucleic acids are NOT DETECTED. The SARS-CoV-2 RNA is generally detectable in upper and lower  respiratory specimens during the acute phase of infection. The lowest  concentration of SARS-CoV-2 viral copies this assay can detect is 250  copies / mL. A negative result does not preclude SARS-CoV-2 infection  and should not be used as the sole basis for treatment or other  patient management decisions.  A negative result may occur with  improper specimen collection / handling, submission of specimen other  than nasopharyngeal swab, presence of viral mutation(s) within the  areas targeted by this assay, and inadequate number of viral copies  (<250 copies / mL). A negative result must be combined with clinical  observations, patient history, and epidemiological information. If result is POSITIVE SARS-CoV-2 target nucleic acids are DETECTED. The SARS-CoV-2 RNA is generally detectable in upper and lower  respiratory specimens dur ing the acute phase of infection.  Positive  results are indicative of active infection with SARS-CoV-2.  Clinical  correlation with patient history and other diagnostic information is  necessary to determine patient infection status.  Positive results do  not rule out bacterial infection or co-infection with other viruses. If result is PRESUMPTIVE POSTIVE SARS-CoV-2 nucleic acids MAY BE PRESENT.   A presumptive positive result was obtained on the submitted specimen   and confirmed on repeat testing.  While 2019 novel coronavirus  (SARS-CoV-2) nucleic acids may be present in the submitted sample  additional confirmatory testing may be necessary for epidemiological  and / or clinical management purposes  to differentiate between  SARS-CoV-2 and other Sarbecovirus currently known to infect humans.  If clinically indicated additional testing with an alternate test  methodology [phone]) is advised. The SARS-CoV-2 RNA is generally  detectable in upper and lower respiratory sp ecimens during the acute  phase of infection. The expected result is Negative. Fact Sheet for Patients:  StrictlyIdeas.no Fact Sheet for Healthcare Providers: BankingDealers.co.za This test is not yet approved or cleared by the [place] FDA and has been authorized for detection and/or diagnosis of SARS-CoV-2 by FDA under an Emergency Use Authorization (EUA).  This EUA will remain in effect (meaning this test can be used) for the duration of the COVID-19 declaration under Section 564(b)(1) of the Act, 21 U.S.C. section 360bbb-3(b)(1), unless the authorization is terminated or revoked sooner. Performed at [facility], [place] [address] [place], [place] [address]   POC occult blood, ED RN will collect     Status: Abnormal   Collection Time: 05/30/19 12:18 PM  Result Value Ref Range   Fecal Occult Bld POSITIVE (A) NEGATIVE  Glucose, capillary     Status: Abnormal   Collection Time: 05/30/19  6:38 PM  Result Value Ref Range   Glucose-Capillary 133 (H) 70 - 99 mg/dL  Lactic acid, plasma     Status: None   Collection Time: 05/30/19  6:45 PM  Result Value Ref Range   Lactic Acid, Venous 1.5 0.5 - 1.9 mmol/L    Comment: Performed at [facility], [place] [address], [place], [place] [address]  Glucose, capillary     Status: Abnormal   Collection Time: 05/30/19 10:24 PM  Result Value Ref Range    Glucose-Capillary 103 (H) 70 - 99 mg/dL  CBC with Differential/Platelet     Status: Abnormal   Collection Time: 05/31/19  7:17 AM  Result Value Ref Range   WBC 6.1 4.0 - 10.5 K/uL   RBC 2.20 (L) 3.87 - 5.11 MIL/uL   Hemoglobin 6.8 (LL) 12.0 - 15.0 g/dL    Comment: This critical result has verified and been called to DUMAS, N. by Raelyn Ensign on 08 20 2020 at 0754, and has been read back. CRITICAL RESULT VERIFIED   HCT 21.4 (L) 36.0 - 46.0 %   MCV 97.3 80.0 - 100.0 fL   MCH 30.9 26.0 - 34.0 pg   MCHC 31.8 30.0 - 36.0 g/dL   RDW 21.7 (H) 11.5 - 15.5 %   Platelets 366 150 - 400 K/uL   nRBC 0.0 0.0 - 0.2 %   Neutrophils Relative % 64 %   Neutro Abs 4.0 1.7 - 7.7 K/uL   Lymphocytes Relative 26 %   Lymphs Abs 1.6 0.7 - 4.0 K/uL   Monocytes Relative 8 %   Monocytes Absolute 0.5 0.1 - 1.0 K/uL   Eosinophils Relative 2 %   Eosinophils Absolute 0.1 0.0 - 0.5 K/uL   Basophils Relative 0 %   Basophils Absolute 0.0 0.0 - 0.1 K/uL   Immature Granulocytes 0 %   Abs Immature Granulocytes 0.01 0.00 - 0.07 K/uL    Comment: Performed at [facility], [place] [address], [place], [place] [address]  Comprehensive metabolic panel     Status: Abnormal   Collection Time: 05/31/19  7:17 AM  Result Value Ref Range   Sodium 139 135 - 145 mmol/L   Potassium 2.9 (L) 3.5 - 5.1 mmol/L   Chloride 97 (L) 98 - 111 mmol/L   CO2 32 22 - 32 mmol/L   Glucose, Bld 90 70 - 99 mg/dL   BUN 27 (H) 8 - 23 mg/dL   Creatinine, Ser 2.14 (H) 0.44 - 1.00 mg/dL   Calcium 7.0 (L) 8.9 - 10.3 mg/dL   Total Protein 5.6 (L) 6.5 - 8.1 g/dL   Albumin 2.1 (L) 3.5 - 5.0 g/dL   AST 13 (L) 15 - 41 U/L   ALT 11 0 - 44 U/L   Alkaline Phosphatase 62 38 - 126 U/L   Total Bilirubin 0.5 0.3 - 1.2 mg/dL   GFR calc non Af Amer 21 (L) >60 mL/min   GFR calc Af Amer 25 (L) >60 mL/min   Anion gap 10 5 - 15    Comment: Performed at [facility], [place] [address] [place], [place] [address]  Lipase, blood      Status: Abnormal   Collection Time: 05/31/19  7:17 AM  Result Value Ref Range   Lipase 53 (H) 11 - 51 U/L    Comment: Performed at [facility], [place] [address], [place], [place] [address]  Magnesium     Status: None   Collection Time: 05/31/19  7:17 AM  Result Value Ref Range   Magnesium 1.9 1.7 - 2.4 mg/dL    Comment: Performed at [facility], [place] [address], [place], [place] [address]  Glucose, capillary     Status: None   Collection Time: 05/31/19  8:03 AM  Result Value Ref Range  Glucose-Capillary 90 70 - 99 mg/dL  Type and screen Hold 2 units now and stay ahead 2 units at all times     Status: None (Preliminary result)   Collection Time: 05/31/19  8:34 AM  Result Value Ref Range   ABO/RH(D) A POS    Antibody Screen NEG    Sample Expiration      06/03/2019,2359 Performed at [facility], [place] [address] [place], [place] [address]    Unit Number [id number]    Blood Component Type RED CELLS,LR    Unit division 00    Status of Unit ALLOCATED    Transfusion Status OK TO TRANSFUSE    Crossmatch Result Compatible    Unit Number [id number]    Blood Component Type RED CELLS,LR    Unit division 00    Status of Unit ALLOCATED    Transfusion Status OK TO TRANSFUSE    Crossmatch Result Compatible   Prepare RBC     Status: None   Collection Time: 05/31/19  8:34 AM  Result Value Ref Range   Order Confirmation      ORDER PROCESSED BY BLOOD BANK Performed at [facility], [place] [address] [place], [place] [address]   Glucose, capillary     Status: None   Collection Time: 05/31/19 11:46 AM  Result Value Ref Range   Glucose-Capillary 83 70 - 99 mg/dL    Medications:  Current Facility-Administered Medications  Medication Dose Route Frequency Provider Last Rate Last Dose  . 0.9 %  sodium chloride infusion (Manually program via Guardrails IV Fluids)   Intravenous Once Dhungel, Nishant, MD      .  amLODipine (NORVASC) tablet 10 mg  10 mg Oral Daily Langford, Archie Patten N, DO   10 mg at 05/31/19 0849  . atorvastatin (LIPITOR) tablet 20 mg  20 mg Oral Daily Irene Pap N, DO   20 mg at 05/31/19 0850  . cholecalciferol (VITAMIN D) tablet 1,000 Units  1,000 Units Oral Daily Irene Pap N, DO   1,000 Units at 05/31/19 0850  . dicyclomine (BENTYL) capsule 20 mg  20 mg Oral TID AC & HS Hall, Carole N, DO   20 mg at 05/31/19 1239  . escitalopram (LEXAPRO) tablet 20 mg  20 mg Oral Daily Irene Pap N, DO   20 mg at 05/31/19 0850  . ferrous sulfate tablet 325 mg  325 mg Oral Q breakfast Kayleen Memos, DO   325 mg at 05/31/19 0849  . HYDROmorphone (DILAUDID) injection 0.5 mg  0.5 mg Intravenous Q4H PRN Irene Pap N, DO   0.5 mg at 05/30/19 1544  . hydrOXYzine (ATARAX/VISTARIL) tablet 10 mg  10 mg Oral TID PRN Kayleen Memos, DO      . insulin aspart (novoLOG) injection 0-5 Units  0-5 Units Subcutaneous QHS Hall, Carole N, DO      . insulin aspart (novoLOG) injection 0-9 Units  0-9 Units Subcutaneous TID WC Irene Pap N, DO   1 Units at 05/30/19 7026  . loperamide (IMODIUM) capsule 2 mg  2 mg Oral QID PRN Irene Pap N, DO      . mirtazapine (REMERON) tablet 15 mg  15 mg Oral QHS Dhungel, Nishant, MD   15 mg at 05/30/19 2252  . oxyCODONE (Oxy IR/ROXICODONE) immediate release tablet 5 mg  5 mg Oral Q3H PRN Irene Pap N, DO      . pantoprazole (PROTONIX) EC tablet 40 mg  40 mg Oral Daily Irene Pap  N, DO   40 mg at 05/31/19 0850  . piperacillin-tazobactam (ZOSYN) IVPB 2.25 g  2.25 g Intravenous Q8H Pham, Anh P, RPH 100 mL/hr at 05/31/19 0614 2.25 g at 05/31/19 0614  . polyethylene glycol (MIRALAX / GLYCOLAX) packet 17 g  17 g Oral Daily PRN Irene Pap N, DO      . potassium chloride SA (K-DUR) CR tablet 40 mEq  40 mEq Oral TID Irene Pap N, DO   40 mEq at 05/30/19 2252  . vitamin B-12 (CYANOCOBALAMIN) tablet 1,000 mcg  1,000 mcg Oral Daily Irene Pap N, DO   1,000 mcg at 05/31/19 4665     Musculoskeletal: Strength & Muscle Tone: Atrophy associated with aging. Gait & Station: UTA since patient is lying in bed. Patient leans: N/A  Psychiatric Specialty Exam: Physical Exam  Nursing note and vitals reviewed. Constitutional: She is oriented to person, place, and time. She appears well-developed and well-nourished.  HENT:  Head: Normocephalic and atraumatic.  Neck: Normal range of motion.  Respiratory: Effort normal.  Musculoskeletal: Normal range of motion.  Neurological: She is alert and oriented to person, place, and time.  Psychiatric: She has a normal mood and affect. Her speech is normal and behavior is normal. Judgment and thought content normal. Cognition and memory are impaired.    Review of Systems  Psychiatric/Behavioral: Negative for hallucinations, substance abuse and suicidal ideas.  All other systems reviewed and are negative.   Blood pressure (!) 120/55, pulse 77, temperature 98.8 F (37.1 C), temperature source Oral, resp. rate 18, weight 52 kg, SpO2 100 %.Body mass index is 20.31 kg/m.  General Appearance: Fairly Groomed, elderly, Hispanic female, wearing a hospital gown who is lying in bed. NAD.   Eye Contact:  Good  Speech:  Clear and Coherent and Normal Rate  Volume:  Normal  Mood:  Euthymic  Affect:  Appropriate and Congruent  Thought Process:  Linear and Descriptions of Associations: Intact  Orientation:  Full (Time, Place, and Person)  Thought Content:  Logical  Suicidal Thoughts:  No  Homicidal Thoughts:  No  Memory:  Immediate;   Fair Recent;   Fair Remote;   Fair  Judgement:  Fair  Insight:  Fair  Psychomotor Activity:  Normal  Concentration:  Concentration: Good and Attention Span: Good  Recall:  [employer] of Knowledge:  Fair  Language:  Good  Akathisia:  No  Handed:  Right  AIMS (if indicated):   N/A  Assets:  Communication Skills Desire for Improvement Housing Intimacy Resilience Social Support  ADL's:  Impaired   Cognition: Impaired with short term memory deficits.   Sleep:   Okay   Assessment:  Samantha Mckenzie is 83 y.o. female who was admitted with GI symptoms and was seen by GI who suspect that her symptoms could represent IBD. Primary team suspects terminal ileitis. Patient denies SI, HI or AVH. Her affect is euthymic on interview today. Patient with similar presentation from last time she was evaluated by psychiatry in June. Her mood appears to fluctuate with her GI symptoms. She endorses SI when she does not feel well but denies any intention to harm self. Agree with PCP increasing Lexapro for depression. Patient does not demonstrate capacity to make decisions regarding her current medical condition as she exhibits cognitive deficits and problems with recall of medical information. She will need an authorized representative to help her make medical decisions.    Treatment Plan Summary: -Continue Lexparo 20 mg daily for depression. Recently  increased by her PCP. -Patient does not demonstrate capacity to make decisions regarding current medical condition.  -EKG reviewed and QTc 508 on 8/19. Please closely monitor when starting or increasing QTc prolonging agents.  -Will sign off on patient at this time. Please consult psychiatry again as needed.     Disposition: No evidence of imminent risk to self or others at present.    This service was provided via telemedicine using a 2-way, interactive audio and video technology.  Names of all persons participating in this telemedicine service and their role in this encounter. Name: Buford Dresser, DO Role: Psychiatrist  Name: Samantha Mckenzie  Role: Patient     Faythe Dingwall, DO 05/31/2019 2:13 PM

## 2019-05-31 NOTE — Consult Note (Addendum)
Referring Provider:  [facility]         Primary Care Physician:  Colon Branch, MD Primary Gastroenterologist:  Scarlette Shorts, MD         Reason for Consultation:    ileitis               ASSESSMENT /  PLAN    1. 83 yo female with several recent ED visits / admissions for weakness, N/V/D, abdominal pain. Now Home Health nurse reporting blood in stools. CTAP early July remarkable for dilated small bowel loop in RLQ with minimal surrounding inflammatory changes and some fecalization suggesting focal enteritis or possibly IBD. Now with ongoing GI symptoms and CTAP now suggesting a long segment of wall thickening of terminal ileum with an area of caliber change in the RLQ causing some distension upstream. Additionally she has had progression of chronic Murray anemia in setting of blood in stool.  -Ongoing symptoms could represent IBD, infectious etiology possible though given duration this seems less likely. Given age, co-morbidities, recent behavior / mental status changes I don't know if she is a good candidate for colonoscopy. Would need to discuss with family.  -Psychiatry to evaluate patient for behavioral changes / depression / mental status changes.   2. Hypokalemia secondary to poor po intake and diarrhea. Repleation in progress.   HPI:     Samantha Mckenzie is a 83 y.o. female with PMH significant for COPD, chronic diastolic heart failure, breast cancer. Patient is a very limited historian. She has recently been seen several times in the ED for weakness, abdominal pain, nausea / vomiting / diarrhea, headaches and confusion. One of those ED visits late June resulted in a several day admission. CTAP suggested enteritis, microperforation not excluded. Treated with antibiotics, Surgery followed along. She improved, discharged on 04/12/19 but readmitted a week later with abdominal pain, nausea, vomiting and diarrhea. Symptoms felt to still be part of recent enteritis. She was discharged home on  Augmentin.   Samantha Mckenzie was brought back to ED yesterday for evaluation of altered mental status and same GI symptoms as before. She is a limited historian.  Per notes she has recently been in two SNFs and left both prior to actual discharge.  She has endorsed depression and desire to die.   On admission she was hypokalemic. WBC normal. Hgb 8.8 (at baseline) but today it is 6.8. Patient says she has intermittent diarrhea, often containing blood. She endorses frequent nausea and vomiting but not abdominal pain. CTAP without contrast yesterday. remarkable for a a long segment wall thickening with mild surrounding fat stranding of the terminal ileum with area of caliber change in the right lower quadrantand mild distension of the upstream ilium measuring up to 2.9 cm in diameter.    Past Medical History:  Diagnosis Date   Allergic rhinitis    Anemia    Breast CA ([place])    surgery, chemo, XRT; had peripheral neuropathy (imbalance at times) after chemo   Cellulitis    Left arm, recurrent    Cellulitis LEFT arm recurrent 12/08/2011   Chronic renal disease, stage IV ([place])    Dr. Moshe Cipro   Depression    Fatigue    GERD (gastroesophageal reflux disease)    gastritis, EGD 02/2007   Gout    Hyperlipidemia    Hypertension    Macular degeneration 2011   Normal cardiac stress test 12/2014   Osteoarthritis    Renal insufficiency     Past Surgical  History:  Procedure Laterality Date   cataracts bilaterally  1 -2012   LUNG BIOPSY  1999   neg   MASTECTOMY Left 1999   and lymphnodes     Prior to Admission medications   Medication Sig Start Date End Date Taking? Authorizing Provider  allopurinol (ZYLOPRIM) 100 MG tablet Take 1 tablet (100 mg total) by mouth daily. 03/19/19  Yes Paz, Alda Berthold, MD  amLODipine (NORVASC) 10 MG tablet Take 1 tablet (10 mg total) by mouth daily. 03/06/19  Yes Paz, Alda Berthold, MD  atorvastatin (LIPITOR) 20 MG tablet Take 1 tablet (20 mg total) by mouth  daily. 01/31/19  Yes Paz, Alda Berthold, MD  Calcium Carb-Cholecalciferol (CALTRATE 600+D3 PO) Take 1 tablet by mouth daily.   Yes [provider]  Cholecalciferol (VITAMIN D3) 1000 UNITS CAPS Take 1 capsule by mouth daily.    Yes [provider]  dicyclomine (BENTYL) 10 MG capsule Take 2 capsules (20 mg total) by mouth 4 (four) times daily -  before meals and at bedtime. 05/10/19  Yes Paz, Alda Berthold, MD  dronabinol (MARINOL) 2.5 MG capsule Take 1 capsule (2.5 mg total) by mouth 2 (two) times daily before a meal. 05/10/19  Yes Paz, Alda Berthold, MD  escitalopram (LEXAPRO) 10 MG tablet Take 2 tablets (20 mg total) by mouth daily. 05/21/19  Yes Paz, Alda Berthold, MD  loperamide (IMODIUM A-D) 2 MG tablet Take 2 mg by mouth 4 (four) times daily as needed for diarrhea or loose stools.   Yes [provider]  metoprolol tartrate (LOPRESSOR) 25 MG tablet Take 1 tablet (25 mg total) by mouth 2 (two) times daily. 04/12/19  Yes Emokpae, Courage, MD  mirtazapine (REMERON) 15 MG tablet Take 15 mg by mouth at bedtime.   Yes [provider]  Multiple Vitamins-Minerals (CENTRUM SILVER PO) Take 1 tablet by mouth daily.    Yes [provider]  pantoprazole (PROTONIX) 40 MG tablet Take 1 tablet (40 mg total) by mouth daily before breakfast. 05/10/19  Yes Paz, Alda Berthold, MD  vitamin B-12 (CYANOCOBALAMIN) 1000 MCG tablet Take 1,000 mcg by mouth daily.     Yes [provider]  vitamin C (ASCORBIC ACID) 500 MG tablet Take 500 mg by mouth daily.   Yes [provider]  acetaminophen (TYLENOL) 325 MG tablet Take 2 tablets (650 mg total) by mouth every 6 (six) hours as needed for mild pain, fever or headache (or Fever >/= 101). Patient not taking: Reported on 05/24/2019 04/12/19   Roxan Hockey, MD  albuterol (PROVENTIL) (2.5 MG/3ML) 0.083% nebulizer solution Take 3 mLs (2.5 mg total) by nebulization every 4 (four) hours as needed for wheezing or shortness of breath. Patient not taking: Reported  on 05/24/2019 04/12/19   Roxan Hockey, MD  aspirin 81 MG tablet Take 1 tablet (81 mg total) by mouth daily with breakfast. Patient not taking: Reported on 05/24/2019 04/12/19   Roxan Hockey, MD  hydrOXYzine (ATARAX/VISTARIL) 10 MG tablet Take 1 tablet (10 mg total) by mouth 3 (three) times daily as needed for nausea. Patient not taking: Reported on 05/30/2019 04/12/19   Roxan Hockey, MD  ondansetron (ZOFRAN-ODT) 4 MG disintegrating tablet Take 1 tablet (4 mg total) by mouth every 6 (six) hours as needed for nausea or vomiting. Patient not taking: Reported on 05/30/2019 05/24/19   Drenda Freeze, MD    Current Facility-Administered Medications  Medication Dose Route Frequency Provider Last Rate Last Dose   0.9 %  sodium chloride infusion (  Manually program via Guardrails IV Fluids)   Intravenous Once Dhungel, Nishant, MD       amLODipine (NORVASC) tablet 10 mg  10 mg Oral Daily Irene Pap N, DO   10 mg at 05/31/19 0849   atorvastatin (LIPITOR) tablet 20 mg  20 mg Oral Daily Kayleen Memos, DO   20 mg at 05/31/19 3790   cholecalciferol (VITAMIN D) tablet 1,000 Units  1,000 Units Oral Daily Kayleen Memos, DO   1,000 Units at 05/31/19 0850   dicyclomine (BENTYL) capsule 20 mg  20 mg Oral TID AC & HS Irene Pap N, DO   20 mg at 05/31/19 0847   escitalopram (LEXAPRO) tablet 20 mg  20 mg Oral Daily Irene Pap N, DO   20 mg at 05/31/19 2409   ferrous sulfate tablet 325 mg  325 mg Oral Q breakfast Kayleen Memos, DO   325 mg at 05/31/19 7353   HYDROmorphone (DILAUDID) injection 0.5 mg  0.5 mg Intravenous Q4H PRN Irene Pap N, DO   0.5 mg at 05/30/19 1544   hydrOXYzine (ATARAX/VISTARIL) tablet 10 mg  10 mg Oral TID PRN Kayleen Memos, DO       insulin aspart (novoLOG) injection 0-5 Units  0-5 Units Subcutaneous QHS Hall, Carole N, DO       insulin aspart (novoLOG) injection 0-9 Units  0-9 Units Subcutaneous TID WC Irene Pap N, DO   1 Units at 05/30/19 2992   loperamide  (IMODIUM) capsule 2 mg  2 mg Oral QID PRN Kayleen Memos, DO       mirtazapine (REMERON) tablet 15 mg  15 mg Oral QHS Hall, Carole N, DO   15 mg at 05/30/19 2252   oxyCODONE (Oxy IR/ROXICODONE) immediate release tablet 5 mg  5 mg Oral Q3H PRN Irene Pap N, DO       pantoprazole (PROTONIX) EC tablet 40 mg  40 mg Oral Daily Irene Pap N, DO   40 mg at 05/31/19 0850   piperacillin-tazobactam (ZOSYN) IVPB 2.25 g  2.25 g Intravenous Q8H Pham, Anh P, RPH 100 mL/hr at 05/31/19 0614 2.25 g at 05/31/19 4268   polyethylene glycol (MIRALAX / GLYCOLAX) packet 17 g  17 g Oral Daily PRN Irene Pap N, DO       potassium chloride 10 mEq in 100 mL IVPB  10 mEq Intravenous Q1 Hr x 3 Dhungel, Nishant, MD 100 mL/hr at 05/31/19 1137 10 mEq at 05/31/19 1137   potassium chloride SA (K-DUR) CR tablet 40 mEq  40 mEq Oral TID Irene Pap N, DO   40 mEq at 05/30/19 2252   vitamin B-12 (CYANOCOBALAMIN) tablet 1,000 mcg  1,000 mcg Oral Daily Irene Pap N, DO   1,000 mcg at 05/31/19 0847    Allergies as of 05/30/2019 - Review Complete 05/30/2019  Allergen Reaction Noted   Codeine Nausea And Vomiting 07/07/2007   Morphine Nausea And Vomiting 07/07/2007   Oxycodone-acetaminophen Nausea And Vomiting 07/07/2007   Sulfonamide derivatives Nausea And Vomiting 07/07/2007   Tramadol hcl Nausea And Vomiting 07/07/2007    Family History  Problem Relation Age of Onset   Breast cancer Other        ? of    Heart attack Father 58   Diabetes Sister        2   Hypertension Sister    Colon cancer Neg Hx    Stomach cancer Neg Hx     Social History   Socioeconomic History  Marital status: Married    Spouse name: Not on file   Number of children: 2   Years of education: Not on file   Highest education level: Not on file  Occupational History   Occupation: retired     Fish farm manager: RETIRED  Scientist, product/process development strain: Not on file   Food insecurity    Worry: Not on file     Inability: Not on file   Transportation needs    Medical: Not on file    Non-medical: Not on file  Tobacco Use   Smoking status: Never Smoker   Smokeless tobacco: Never Used  Substance and Sexual Activity   Alcohol use: Not Currently    Alcohol/week: 0.0 standard drinks    Comment: rarely   Drug use: No   Sexual activity: Not Currently  Lifestyle   Physical activity    Days per week: Not on file    Minutes per session: Not on file   Stress: Not on file  Relationships   Social connections    Talks on phone: Not on file    Gets together: Not on file    Attends religious service: Not on file    Active member of club or organization: Not on file    Attends meetings of clubs or organizations: Not on file    Relationship status: Not on file   Intimate partner violence    Fear of current or ex partner: Not on file    Emotionally abused: Not on file    Physically abused: Not on file    Forced sexual activity: Not on file  Other Topics Concern   Not on file  Social History Narrative   Lives w/ husband , 2 children in [place], 2 [place]            Review of Systems: All systems reviewed and negative except where noted in HPI.  Physical Exam: Vital signs in last 24 hours: Temp:  [98.3 F (36.8 C)-99.2 F (37.3 C)] 99.2 F (37.3 C) (08/20 0524) Pulse Rate:  [69-87] 77 (08/20 0524) Resp:  [14-19] 16 (08/20 0524) BP: (100-158)/(50-70) 133/62 (08/20 0524) SpO2:  [94 %-100 %] 94 % (08/20 0524) Weight:  [52 kg] 52 kg (08/20 0500) Last BM Date: 05/30/19 General:   Alert, well-developed,  female in NAD Psych:  Pleasant, cooperative. . Eyes:  Pupils equal, sclera clear, no icterus.   Conjunctiva pink. Ears:  Normal auditory acuity. Nose:  No deformity, discharge,  or lesions. Neck:  Supple; no masses Lungs:  Clear throughout to auscultation. Heart:  Regular rate and rhythm; no lower extremity edema Abdomen:  Soft, non-distended, nontender, BS active, no palp mass     Rectal:  Deferred  Msk:  Symmetrical without gross deformities. . Neurologic:  Alert, answers questions appropriately at this time.  Skin:  Intact without significant lesions or rashes.   Intake/Output from previous day: 08/19 0701 - 08/20 0700 In: 1191.8 [I.V.:141.8; IV Piggyback:1050] Out: -  Intake/Output this shift: No intake/output data recorded.  Lab Results: Recent Labs    05/30/19 0616 05/30/19 0654 05/31/19 0717  WBC 10.3  --  6.1  HGB 8.8* 9.2* 6.8*  HCT 26.2* 27.0* 21.4*  PLT 483*  --  366   BMET Recent Labs    05/30/19 0616 05/30/19 0654 05/31/19 0717  NA 134* 135 139  K 3.0* 3.0* 2.9*  CL 98 99 97*  CO2 18*  --  32  GLUCOSE 171* 168* 90  BUN 34* 27* 27*  CREATININE 2.57* 2.40* 2.14*  CALCIUM 7.8*  --  7.0*   LFT Recent Labs    05/31/19 0717  PROT 5.6*  ALBUMIN 2.1*  AST 13*  ALT 11  ALKPHOS 62  BILITOT 0.5   PT/INR Recent Labs    05/30/19 0616  LABPROT 12.7  INR 1.0   Hepatitis Panel No results for input(s): HEPBSAG, HCVAB, HEPAIGM, HEPBIGM in the last 72 hours.   . CBC Latest Ref Rng & Units 05/31/2019 05/30/2019 05/30/2019  WBC 4.0 - 10.5 K/uL 6.1 - 10.3  Hemoglobin 12.0 - 15.0 g/dL 6.8(LL) 9.2(L) 8.8(L)  Hematocrit 36.0 - 46.0 % 21.4(L) 27.0(L) 26.2(L)  Platelets 150 - 400 K/uL 366 - 483(H)    . CMP Latest Ref Rng & Units 05/31/2019 05/30/2019 05/30/2019  Glucose 70 - 99 mg/dL 90 168(H) 171(H)  BUN 8 - 23 mg/dL 27(H) 27(H) 34(H)  Creatinine 0.44 - 1.00 mg/dL 2.14(H) 2.40(H) 2.57(H)  Sodium 135 - 145 mmol/L 139 135 134(L)  Potassium 3.5 - 5.1 mmol/L 2.9(L) 3.0(L) 3.0(L)  Chloride 98 - 111 mmol/L 97(L) 99 98  CO2 22 - 32 mmol/L 32 - 18(L)  Calcium 8.9 - 10.3 mg/dL 7.0(L) - 7.8(L)  Total Protein 6.5 - 8.1 g/dL 5.6(L) - 7.3  Total Bilirubin 0.3 - 1.2 mg/dL 0.5 - 1.1  Alkaline Phos 38 - 126 U/L 62 - 76  AST 15 - 41 U/L 13(L) - 20  ALT 0 - 44 U/L 11 - 15   Studies/Results: Ct Abdomen Pelvis Wo Contrast  Result Date:  05/30/2019 CLINICAL DATA:  Generalized abdominal pain, nausea EXAM: CT ABDOMEN AND PELVIS WITHOUT CONTRAST TECHNIQUE: Multidetector CT imaging of the abdomen and pelvis was performed following the standard protocol without IV contrast. COMPARISON:  04/20/2019 FINDINGS: Lower chest: No acute abnormality. Hepatobiliary: No focal liver abnormality is seen. No gallstones, gallbladder wall thickening, or biliary dilatation. Pancreas: Unremarkable. No pancreatic ductal dilatation or surrounding inflammatory changes. Spleen: Scattered calcified granulomas within the spleen. No acute findings. Adrenals/Urinary Tract: Unremarkable adrenal glands. Bilateral renal cortical atrophy. Stable appearance of bilateral renal cysts. Extrarenal pelvis is noted bilaterally. No hydronephrosis. Ureters nondilated. Urinary bladder is moderately distended without acute abnormality. Stomach/Bowel: There is long segment wall thickening with mild surrounding fat stranding of the terminal ileum with area of caliber change in the right lower quadrant (series 2, image 49) and mild distension of the upstream ilium measuring up to 2.9 cm in diameter. Remaining small bowel is within normal limits. Scattered colonic diverticulosis. No pericolonic inflammatory changes. Small hiatal hernia. Vascular/Lymphatic: Atherosclerotic calcification of the aortoiliac axis. No abdominal or pelvic lymphadenopathy identified. Reproductive: Uterus and bilateral adnexa are unremarkable. Other: No abdominal wall hernia or abnormality. No abdominopelvic ascites. No abscess. Musculoskeletal: Grade 1 anterolisthesis L4 on L5. No acute osseous findings. IMPRESSION: 1. Long segment wall thickening and surrounding mild inflammatory changes of the terminal ileum suggesting terminal ileitis, which can be seen in the setting of inflammatory bowel disease. 2. There is focal caliber change proximally with mild distension of the more proximal ileum raising the possibility of  stricture. No evidence to suggest small bowel obstruction. Electronically Signed   By: Davina Poke M.D.   On: 05/30/2019 11:25   Ct Head Wo Contrast  Result Date: 05/30/2019 CLINICAL DATA:  Altered level of consciousness EXAM: CT HEAD WITHOUT CONTRAST CT CERVICAL SPINE WITHOUT CONTRAST TECHNIQUE: Multidetector CT imaging of the head and cervical spine was performed following the standard protocol without intravenous contrast.  Multiplanar CT image reconstructions of the cervical spine were also generated. COMPARISON:  05/24/2019 FINDINGS: CT HEAD FINDINGS Brain: No evidence of acute infarction, hemorrhage, extra-axial collection, ventriculomegaly, or mass effect. Generalized cerebral atrophy. Periventricular white matter low attenuation likely secondary to microangiopathy. Vascular: Cerebrovascular atherosclerotic calcifications are noted. Skull: Negative for fracture or focal lesion. Sinuses/Orbits: Visualized portions of the orbits are unremarkable. Visualized portions of the paranasal sinuses and mastoid air cells are unremarkable. Other: Scalp contusion along the left vertex. CT CERVICAL SPINE FINDINGS Alignment: Normal. Skull base and vertebrae: No acute fracture. No primary bone lesion or focal pathologic process. Erosion along the anterior aspect of the dens as can be seen with crystalline arthropathy. Soft tissues and spinal canal: No prevertebral fluid or swelling. No visible canal hematoma. Disc levels: Degenerative disc disease with disc height loss at C4-5 and C5-6. Right uncovertebral degenerative changes at C4-5 and C5-6 resulting in foraminal stenosis. Mild left foraminal stenosis at C4-5. Upper chest: Left apical scarring. Other: Bilateral carotid artery atherosclerosis. IMPRESSION: 1. No acute intracranial pathology. 2.  No acute osseous injury of the cervical spine. Electronically Signed   By: Kathreen Devoid   On: 05/30/2019 07:33   Ct Cervical Spine Wo Contrast  Result Date:  05/30/2019 CLINICAL DATA:  Altered level of consciousness EXAM: CT HEAD WITHOUT CONTRAST CT CERVICAL SPINE WITHOUT CONTRAST TECHNIQUE: Multidetector CT imaging of the head and cervical spine was performed following the standard protocol without intravenous contrast. Multiplanar CT image reconstructions of the cervical spine were also generated. COMPARISON:  05/24/2019 FINDINGS: CT HEAD FINDINGS Brain: No evidence of acute infarction, hemorrhage, extra-axial collection, ventriculomegaly, or mass effect. Generalized cerebral atrophy. Periventricular white matter low attenuation likely secondary to microangiopathy. Vascular: Cerebrovascular atherosclerotic calcifications are noted. Skull: Negative for fracture or focal lesion. Sinuses/Orbits: Visualized portions of the orbits are unremarkable. Visualized portions of the paranasal sinuses and mastoid air cells are unremarkable. Other: Scalp contusion along the left vertex. CT CERVICAL SPINE FINDINGS Alignment: Normal. Skull base and vertebrae: No acute fracture. No primary bone lesion or focal pathologic process. Erosion along the anterior aspect of the dens as can be seen with crystalline arthropathy. Soft tissues and spinal canal: No prevertebral fluid or swelling. No visible canal hematoma. Disc levels: Degenerative disc disease with disc height loss at C4-5 and C5-6. Right uncovertebral degenerative changes at C4-5 and C5-6 resulting in foraminal stenosis. Mild left foraminal stenosis at C4-5. Upper chest: Left apical scarring. Other: Bilateral carotid artery atherosclerosis. IMPRESSION: 1. No acute intracranial pathology. 2.  No acute osseous injury of the cervical spine. Electronically Signed   By: Kathreen Devoid   On: 05/30/2019 07:33   Dg Chest Portable 1 View  Result Date: 05/30/2019 CLINICAL DATA:  Altered mental status EXAM: PORTABLE CHEST 1 VIEW COMPARISON:  05/16/2019 FINDINGS: The heart size and mediastinal contours are within normal limits. Both lungs  are clear. Old left posterior rib fractures. Surgical clips in the left axilla. IMPRESSION: No active disease. Electronically Signed   By: Kathreen Devoid   On: 05/30/2019 06:57    Active Problems:   Harlow Mares, NP-C @  05/31/2019, 11:41 AM  GI ATTENDING  History, laboratories, x-rays reviewed.  Prior colonoscopy report reviewed.  Patient known to me.  Has had 3 hospitalizations for similar complaints of nausea, vomiting, abdominal discomfort, and diarrhea.  Persistent abnormality of the ileum on CT imaging.  Anemic.  I am concerned that she may have Crohn's disease.  Cannot rule out  small bowel malignancy such as lymphoma.  At this point she needs rehydration, correction of electrolyte abnormalities, and repeat stool studies to be certain of no enteric infection.  Ideally would like to perform colonoscopy with the intent of ileal intubation and biopsies.  Short of that could consider empiric trial of steroids, watching her closely.  Will follow.  Docia Chuck. Geri Semin[place]., M.D. [facility] Division of Gastroenterology

## 2019-05-31 NOTE — Progress Notes (Signed)
PROGRESS NOTE                                                                                                                                                                                                             Patient Demographics:    Samantha Mckenzie, is a 83 y.o. female, DOB - 1936/09/12, YHC:[id number]  Admit date - 05/30/2019   Admitting Physician Kayleen Memos, DO  Outpatient Primary MD for the patient is Colon Branch, MD  LOS - 1  Outpatient Specialists:  Chief Complaint  Patient presents with   Nausea       Brief Narrative   83 year old female with history of GERD, prior history of focal enteritis versus?  IBD, type 2 diabetes mellitus with peripheral neuropathy, COPD, chronic diastolic CHF, breast cancer status post left mastectomy was brought to the ED from home with diffuse abdominal pain, nausea, vomiting and diarrhea.  Also reported frank blood in stool, poor p.o. intake. In the ED she was found to be in early sepsis with tachycardia, tachypnea, hypertensive, AKI and hypokalemia.  Also had elevated lactic acid of 2.9.  CT abdomen pelvis without contrast showed findings of terminal ileitis.  Admitted to hospital service on empiric antibiotics.   Subjective:   Patient seen and examined.  Denies abdominal pain today.  No nausea or vomiting.  Denies noticing blood in stool.   Assessment  & Plan :   Sepsis with terminal ileitis, infectious versus inflammatory. Suspect recurrent with recent similar symptoms.  Continue empiric IV Zosyn and fluids.  Received IV bicarb for metabolic acidosis which has been discontinued. Continue clear liquid and advance. Sepsis resolved.  Will follow with GI for further recommendations. Discontinue narcotics  and monitor on Tylenol as needed only.  Acute blood loss anemia Has baseline normocytic anemia with hemoglobin around 8.5.  Dropped to 6.8 this morning.  Ordered 1 unit  PRBC.   Monitor H&H in a.m.  Acute on chronic kidney disease stage III Possibly prerenal with dehydration.  Baseline creatinine around 2, worsened to 2.57 on presentation.  Improving with IV fluids this morning.  Hypokalemia Replenishing with p.o. and IV.  Magnesium normal.  Failure to thrive and poor p.o. intake PT and nutrition consult  Type 2 diabetes mellitus, controlled A1c of 5.2.  Monitor on sliding scale coverage   ?  Depression and frontotemporal dementia. PCP had concern brought up by family that patient was not adherent to treatment regimen.  She tells me that she was having frequent dizzy spells and and headache and feeling depressed for past several weeks.  Reported feeling worthless but denied any suicidal ideation.  She is on Lexapro and dose was recently increased by her PCP.  Continue Lexapro and Remeron for now.  I have consulted psych to evaluate her depression and assess capacity as well. Patient was confused when I walked into the room and was not oriented to where she was but then started to answer questions appropriately.  Essential hypertension Continue amlodipine  Dyslipidemia Continue statin  Code Status : DNR  Family Communication  : None at bedside  Disposition Plan  : Home possibly in the next 24-48 hours if no further GI symptoms and H&H stable.  Barriers For Discharge : Active symptoms Consults  : GI  Procedures  : CT abdomen  DVT Prophylaxis  : SCDs  Lab Results  Component Value Date   PLT 366 05/31/2019    Antibiotics  :    Anti-infectives (From admission, onward)   Start     Dose/Rate Route Frequency Ordered Stop   05/30/19 1400  piperacillin-tazobactam (ZOSYN) IVPB 2.25 g     2.25 g 100 mL/hr over 30 Minutes Intravenous Every 8 hours 05/30/19 1343          Objective:   Vitals:   05/30/19 1822 05/30/19 2222 05/31/19 0500 05/31/19 0524  BP: 132/60 134/62  133/62  Pulse: 85 80  77  Resp: 16 16  16   Temp: 98.3 F (36.8 C)  98.4 F (36.9 C)  99.2 F (37.3 C)  TempSrc: Oral Oral    SpO2: 100% 100%  94%  Weight:   52 kg     Wt Readings from Last 3 Encounters:  05/31/19 52 kg  05/24/19 59 kg  05/16/19 59 kg     Intake/Output Summary (Last 24 hours) at 05/31/2019 1341 Last data filed at 05/30/2019 1827 Gross per 24 hour  Intake 191.83 ml  Output --  Net 191.83 ml     Physical Exam  Gen: not in distress HEENT: Pallor present moist mucosa, supple neck Chest: clear b/l, no added sounds CVS: N S1&S2, no murmurs,  GI: soft, NT, ND, BS+ Musculoskeletal: warm, no edema     Data Review:    CBC Recent Labs  Lab 05/30/19 0616 05/30/19 0654 05/31/19 0717  WBC 10.3  --  6.1  HGB 8.8* 9.2* 6.8*  HCT 26.2* 27.0* 21.4*  PLT 483*  --  366  MCV 92.9  --  97.3  MCH 31.2  --  30.9  MCHC 33.6  --  31.8  RDW 21.0*  --  21.7*  LYMPHSABS 0.7  --  1.6  MONOABS 0.5  --  0.5  EOSABS 0.0  --  0.1  BASOSABS 0.0  --  0.0    Chemistries  Recent Labs  Lab 05/30/19 0616 05/30/19 0654 05/31/19 0717  NA 134* 135 139  K 3.0* 3.0* 2.9*  CL 98 99 97*  CO2 18*  --  32  GLUCOSE 171* 168* 90  BUN 34* 27* 27*  CREATININE 2.57* 2.40* 2.14*  CALCIUM 7.8*  --  7.0*  MG  --   --  1.9  AST 20  --  13*  ALT 15  --  11  ALKPHOS 76  --  62  BILITOT 1.1  --  0.5   ------------------------------------------------------------------------------------------------------------------ No results for input(s): CHOL, HDL, LDLCALC, TRIG, CHOLHDL, LDLDIRECT in the last 72 hours.  Lab Results  Component Value Date   HGBA1C 5.2 05/30/2019   ------------------------------------------------------------------------------------------------------------------ No results for input(s): TSH, T4TOTAL, T3FREE, THYROIDAB in the last 72 hours.  Invalid input(s): FREET3 ------------------------------------------------------------------------------------------------------------------ No results for input(s): VITAMINB12, FOLATE,  FERRITIN, TIBC, IRON, RETICCTPCT in the last 72 hours.  Coagulation profile Recent Labs  Lab 05/30/19 0616  INR 1.0    No results for input(s): DDIMER in the last 72 hours.  Cardiac Enzymes No results for input(s): CKMB, TROPONINI, MYOGLOBIN in the last 168 hours.  Invalid input(s): CK ------------------------------------------------------------------------------------------------------------------    Component Value Date/Time   BNP 294.1 (H) 04/07/2019 2157    Inpatient Medications  Scheduled Meds:  sodium chloride   Intravenous Once   amLODipine  10 mg Oral Daily   atorvastatin  20 mg Oral Daily   cholecalciferol  1,000 Units Oral Daily   dicyclomine  20 mg Oral TID AC & HS   escitalopram  20 mg Oral Daily   ferrous sulfate  325 mg Oral Q breakfast   insulin aspart  0-5 Units Subcutaneous QHS   insulin aspart  0-9 Units Subcutaneous TID WC   mirtazapine  15 mg Oral QHS   pantoprazole  40 mg Oral Daily   potassium chloride  40 mEq Oral TID   vitamin B-12  1,000 mcg Oral Daily   Continuous Infusions:  piperacillin-tazobactam (ZOSYN)  IV 2.25 g (05/31/19 6286)   potassium chloride 10 mEq (05/31/19 1241)   PRN Meds:.HYDROmorphone (DILAUDID) injection, hydrOXYzine, loperamide, oxyCODONE, polyethylene glycol  Micro Results Recent Results (from the past 240 hour(s))  SARS Coronavirus 2 [facility] order, Performed in [facility] hospital lab) Nasopharyngeal Nasopharyngeal Swab     Status: None   Collection Time: 05/24/19 12:41 PM   Specimen: Nasopharyngeal Swab  Result Value Ref Range Status   SARS Coronavirus 2 NEGATIVE NEGATIVE Final    Comment: (NOTE) If result is NEGATIVE SARS-CoV-2 target nucleic acids are NOT DETECTED. The SARS-CoV-2 RNA is generally detectable in upper and lower  respiratory specimens during the acute phase of infection. The lowest  concentration of SARS-CoV-2 viral copies this assay can detect is 250  copies / mL. A negative  result does not preclude SARS-CoV-2 infection  and should not be used as the sole basis for treatment or other  patient management decisions.  A negative result may occur with  improper specimen collection / handling, submission of specimen other  than nasopharyngeal swab, presence of viral mutation(s) within the  areas targeted by this assay, and inadequate number of viral copies  (<250 copies / mL). A negative result must be combined with clinical  observations, patient history, and epidemiological information. If result is POSITIVE SARS-CoV-2 target nucleic acids are DETECTED. The SARS-CoV-2 RNA is generally detectable in upper and lower  respiratory specimens dur ing the acute phase of infection.  Positive  results are indicative of active infection with SARS-CoV-2.  Clinical  correlation with patient history and other diagnostic information is  necessary to determine patient infection status.  Positive results do  not rule out bacterial infection or co-infection with other viruses. If result is PRESUMPTIVE POSTIVE SARS-CoV-2 nucleic acids MAY BE PRESENT.   A presumptive positive result was obtained on the submitted specimen  and confirmed on repeat testing.  While 2019 novel coronavirus  (SARS-CoV-2) nucleic acids may be present in the submitted sample  additional confirmatory testing may  be necessary for epidemiological  and / or clinical management purposes  to differentiate between  SARS-CoV-2 and other Sarbecovirus currently known to infect humans.  If clinically indicated additional testing with an alternate test  methodology [phone]) is advised. The SARS-CoV-2 RNA is generally  detectable in upper and lower respiratory sp ecimens during the acute  phase of infection. The expected result is Negative. Fact Sheet for Patients:  StrictlyIdeas.no Fact Sheet for Healthcare Providers: BankingDealers.co.za This test is not yet  approved or cleared by the [place] FDA and has been authorized for detection and/or diagnosis of SARS-CoV-2 by FDA under an Emergency Use Authorization (EUA).  This EUA will remain in effect (meaning this test can be used) for the duration of the COVID-19 declaration under Section 564(b)(1) of the Act, 21 U.S.C. section 360bbb-3(b)(1), unless the authorization is terminated or revoked sooner. Performed at [facility], [place] [address] [place], [place] [address]   Blood culture (routine x 2)     Status: None (Preliminary result)   Collection Time: 05/30/19  6:16 AM   Specimen: BLOOD  Result Value Ref Range Status   Specimen Description   Final    BLOOD BLOOD RIGHT FOREARM Performed at [place] [address] [place], [place] [address]    Special Requests   Final    BOTTLES DRAWN AEROBIC ONLY Blood Culture adequate volume Performed at [place] [address] [place], [place] [address]    Culture   Final    NO GROWTH < 24 HOURS Performed at [place] [address] [place], [place] [address]    Report Status PENDING  Incomplete  Blood culture (routine x 2)     Status: None (Preliminary result)   Collection Time: 05/30/19  6:44 AM   Specimen: BLOOD  Result Value Ref Range Status   Specimen Description   Final    BLOOD LEFT ANTECUBITAL Performed at [place] [facility] [address] [place], [name] [address]    Special Requests   Final    BOTTLES DRAWN AEROBIC AND ANAEROBIC Blood Culture results may not be optimal due to an excessive volume of blood received in culture bottles Performed at [facility] [address], [place], [place] [address]    Culture   Final    NO GROWTH < 24 HOURS Performed at [facility] [place] [address], [place], [place] [address]    Report Status PENDING  Incomplete  SARS Coronavirus 2 [facility] order, Performed in [facility] hospital lab)  Nasopharyngeal Nasopharyngeal Swab     Status: None   Collection Time: 05/30/19 12:16 PM   Specimen: Nasopharyngeal Swab  Result Value Ref Range Status   SARS Coronavirus 2 NEGATIVE NEGATIVE Final    Comment: (NOTE) If result is NEGATIVE SARS-CoV-2 target nucleic acids are NOT DETECTED. The SARS-CoV-2 RNA is generally detectable in upper and lower  respiratory specimens during the acute phase of infection. The lowest  concentration of SARS-CoV-2 viral copies this assay can detect is 250  copies / mL. A negative result does not preclude SARS-CoV-2 infection  and should not be used as the sole basis for treatment or other  patient management decisions.  A negative result may occur with  improper specimen collection / handling, submission of specimen other  than nasopharyngeal swab, presence of viral mutation(s) within the  areas targeted by this assay, and inadequate number of viral copies  (<250 copies /  mL). A negative result must be combined with clinical  observations, patient history, and epidemiological information. If result is POSITIVE SARS-CoV-2 target nucleic acids are DETECTED. The SARS-CoV-2 RNA is generally detectable in upper and lower  respiratory specimens dur ing the acute phase of infection.  Positive  results are indicative of active infection with SARS-CoV-2.  Clinical  correlation with patient history and other diagnostic information is  necessary to determine patient infection status.  Positive results do  not rule out bacterial infection or co-infection with other viruses. If result is PRESUMPTIVE POSTIVE SARS-CoV-2 nucleic acids MAY BE PRESENT.   A presumptive positive result was obtained on the submitted specimen  and confirmed on repeat testing.  While 2019 novel coronavirus  (SARS-CoV-2) nucleic acids may be present in the submitted sample  additional confirmatory testing may be necessary for epidemiological  and / or clinical management purposes  to  differentiate between  SARS-CoV-2 and other Sarbecovirus currently known to infect humans.  If clinically indicated additional testing with an alternate test  methodology [phone]) is advised. The SARS-CoV-2 RNA is generally  detectable in upper and lower respiratory sp ecimens during the acute  phase of infection. The expected result is Negative. Fact Sheet for Patients:  StrictlyIdeas.no Fact Sheet for Healthcare Providers: BankingDealers.co.za This test is not yet approved or cleared by the [place] FDA and has been authorized for detection and/or diagnosis of SARS-CoV-2 by FDA under an Emergency Use Authorization (EUA).  This EUA will remain in effect (meaning this test can be used) for the duration of the COVID-19 declaration under Section 564(b)(1) of the Act, 21 U.S.C. section 360bbb-3(b)(1), unless the authorization is terminated or revoked sooner. Performed at [facility], [place] [address] [place], [place] [address]     Radiology Reports Ct Abdomen Pelvis Wo Contrast  Result Date: 05/30/2019 CLINICAL DATA:  Generalized abdominal pain, nausea EXAM: CT ABDOMEN AND PELVIS WITHOUT CONTRAST TECHNIQUE: Multidetector CT imaging of the abdomen and pelvis was performed following the standard protocol without IV contrast. COMPARISON:  04/20/2019 FINDINGS: Lower chest: No acute abnormality. Hepatobiliary: No focal liver abnormality is seen. No gallstones, gallbladder wall thickening, or biliary dilatation. Pancreas: Unremarkable. No pancreatic ductal dilatation or surrounding inflammatory changes. Spleen: Scattered calcified granulomas within the spleen. No acute findings. Adrenals/Urinary Tract: Unremarkable adrenal glands. Bilateral renal cortical atrophy. Stable appearance of bilateral renal cysts. Extrarenal pelvis is noted bilaterally. No hydronephrosis. Ureters nondilated. Urinary bladder is moderately distended  without acute abnormality. Stomach/Bowel: There is long segment wall thickening with mild surrounding fat stranding of the terminal ileum with area of caliber change in the right lower quadrant (series 2, image 49) and mild distension of the upstream ilium measuring up to 2.9 cm in diameter. Remaining small bowel is within normal limits. Scattered colonic diverticulosis. No pericolonic inflammatory changes. Small hiatal hernia. Vascular/Lymphatic: Atherosclerotic calcification of the aortoiliac axis. No abdominal or pelvic lymphadenopathy identified. Reproductive: Uterus and bilateral adnexa are unremarkable. Other: No abdominal wall hernia or abnormality. No abdominopelvic ascites. No abscess. Musculoskeletal: Grade 1 anterolisthesis L4 on L5. No acute osseous findings. IMPRESSION: 1. Long segment wall thickening and surrounding mild inflammatory changes of the terminal ileum suggesting terminal ileitis, which can be seen in the setting of inflammatory bowel disease. 2. There is focal caliber change proximally with mild distension of the more proximal ileum raising the possibility of stricture. No evidence to suggest small bowel obstruction. Electronically Signed   By: Davina Poke M.D.   On: 05/30/2019 11:25  Dg Chest 2 View  Result Date: 05/16/2019 CLINICAL DATA:  Altered mental status, left-sided deficit beginning at 11 a.m. this morning. Left shoulder pain EXAM: CHEST - 2 VIEW COMPARISON:  Numerous priors, most recent March 19, 2019 FINDINGS: Streaky bandlike areas of atelectasis are present right mid lung and left lung base. Postsurgical changes are present the left lung. The lungs are otherwise clear. No pneumothorax. No effusions. Stable appearance of the cardiomediastinal contours including rightward deviation of the trachea. Surgical clips project over the left axilla. No acute osseous or soft tissue abnormality. Postthoracotomy changes with prior left rib resection and remote rib fractures. Surgical  clips project in the left axilla. IMPRESSION: Bilateral atelectasis. No acute cardiopulmonary disease. Electronically Signed   By: Lovena Le M.D.   On: 05/16/2019 16:12   Ct Head Wo Contrast  Result Date: 05/30/2019 CLINICAL DATA:  Altered level of consciousness EXAM: CT HEAD WITHOUT CONTRAST CT CERVICAL SPINE WITHOUT CONTRAST TECHNIQUE: Multidetector CT imaging of the head and cervical spine was performed following the standard protocol without intravenous contrast. Multiplanar CT image reconstructions of the cervical spine were also generated. COMPARISON:  05/24/2019 FINDINGS: CT HEAD FINDINGS Brain: No evidence of acute infarction, hemorrhage, extra-axial collection, ventriculomegaly, or mass effect. Generalized cerebral atrophy. Periventricular white matter low attenuation likely secondary to microangiopathy. Vascular: Cerebrovascular atherosclerotic calcifications are noted. Skull: Negative for fracture or focal lesion. Sinuses/Orbits: Visualized portions of the orbits are unremarkable. Visualized portions of the paranasal sinuses and mastoid air cells are unremarkable. Other: Scalp contusion along the left vertex. CT CERVICAL SPINE FINDINGS Alignment: Normal. Skull base and vertebrae: No acute fracture. No primary bone lesion or focal pathologic process. Erosion along the anterior aspect of the dens as can be seen with crystalline arthropathy. Soft tissues and spinal canal: No prevertebral fluid or swelling. No visible canal hematoma. Disc levels: Degenerative disc disease with disc height loss at C4-5 and C5-6. Right uncovertebral degenerative changes at C4-5 and C5-6 resulting in foraminal stenosis. Mild left foraminal stenosis at C4-5. Upper chest: Left apical scarring. Other: Bilateral carotid artery atherosclerosis. IMPRESSION: 1. No acute intracranial pathology. 2.  No acute osseous injury of the cervical spine. Electronically Signed   By: Kathreen Devoid   On: 05/30/2019 07:33   Ct Head Wo  Contrast  Result Date: 05/24/2019 CLINICAL DATA:  Worsening headaches. Recent falls. Nausea and emesis today. EXAM: CT HEAD WITHOUT CONTRAST TECHNIQUE: Contiguous axial images were obtained from the base of the skull through the vertex without intravenous contrast. COMPARISON:  Head CT 05/16/2019 FINDINGS: Brain: No intracranial hemorrhage, mass effect, or midline shift. Unchanged degree of atrophy and chronic small vessel ischemia. No hydrocephalus. The basilar cisterns are patent. No evidence of territorial infarct or acute ischemia. No extra-axial or intracranial fluid collection. Vascular: Atherosclerosis of skullbase vasculature without hyperdense vessel or abnormal calcification. Skull: No fracture or focal lesion. Sinuses/Orbits: Minor mucosal thickening of ethmoid air cells. No acute findings. No sinus fluid level. Bilateral lens extraction. Other: None. IMPRESSION: 1. No acute intracranial abnormality. 2. Unchanged atrophy and chronic small vessel ischemia. Electronically Signed   By: Keith Rake M.D.   On: 05/24/2019 16:48   Ct Head Wo Contrast  Result Date: 05/16/2019 CLINICAL DATA:  TIA, multiple falls, hit back of head EXAM: CT HEAD WITHOUT CONTRAST TECHNIQUE: Contiguous axial images were obtained from the base of the skull through the vertex without intravenous contrast. COMPARISON:  CT brain 05/01/2019 FINDINGS: Brain: No acute territorial infarction, hemorrhage or intracranial mass. Atrophy and  minimal small vessel ischemic change of the white matter. Stable ventricle size. Vascular: No hyperdense vessels. Vertebral and carotid vascular calcification Skull: Normal. Negative for fracture or focal lesion. Sinuses/Orbits: No acute finding. Mild mucosal thickening in the maxillary and ethmoid sinuses Other: None IMPRESSION: 1. No CT evidence for acute intracranial abnormality. 2. Atrophy and minimal small vessel ischemic changes of the white matter Electronically Signed   By: Donavan Foil M.D.    On: 05/16/2019 17:49   Ct Cervical Spine Wo Contrast  Result Date: 05/30/2019 CLINICAL DATA:  Altered level of consciousness EXAM: CT HEAD WITHOUT CONTRAST CT CERVICAL SPINE WITHOUT CONTRAST TECHNIQUE: Multidetector CT imaging of the head and cervical spine was performed following the standard protocol without intravenous contrast. Multiplanar CT image reconstructions of the cervical spine were also generated. COMPARISON:  05/24/2019 FINDINGS: CT HEAD FINDINGS Brain: No evidence of acute infarction, hemorrhage, extra-axial collection, ventriculomegaly, or mass effect. Generalized cerebral atrophy. Periventricular white matter low attenuation likely secondary to microangiopathy. Vascular: Cerebrovascular atherosclerotic calcifications are noted. Skull: Negative for fracture or focal lesion. Sinuses/Orbits: Visualized portions of the orbits are unremarkable. Visualized portions of the paranasal sinuses and mastoid air cells are unremarkable. Other: Scalp contusion along the left vertex. CT CERVICAL SPINE FINDINGS Alignment: Normal. Skull base and vertebrae: No acute fracture. No primary bone lesion or focal pathologic process. Erosion along the anterior aspect of the dens as can be seen with crystalline arthropathy. Soft tissues and spinal canal: No prevertebral fluid or swelling. No visible canal hematoma. Disc levels: Degenerative disc disease with disc height loss at C4-5 and C5-6. Right uncovertebral degenerative changes at C4-5 and C5-6 resulting in foraminal stenosis. Mild left foraminal stenosis at C4-5. Upper chest: Left apical scarring. Other: Bilateral carotid artery atherosclerosis. IMPRESSION: 1. No acute intracranial pathology. 2.  No acute osseous injury of the cervical spine. Electronically Signed   By: Kathreen Devoid   On: 05/30/2019 07:33   Dg Chest Portable 1 View  Result Date: 05/30/2019 CLINICAL DATA:  Altered mental status EXAM: PORTABLE CHEST 1 VIEW COMPARISON:  05/16/2019 FINDINGS: The  heart size and mediastinal contours are within normal limits. Both lungs are clear. Old left posterior rib fractures. Surgical clips in the left axilla. IMPRESSION: No active disease. Electronically Signed   By: Kathreen Devoid   On: 05/30/2019 06:57   Dg Shoulder Left  Result Date: 05/16/2019 CLINICAL DATA:  Altered mental status, left-sided deficits, left shoulder pain EXAM: LEFT SHOULDER - 2+ VIEW COMPARISON:  None. FINDINGS: No evidence of fracture or traumatic malalignment. Degenerative changes of the acromioclavicular and lesser changes at the glenohumeral joint. Coracoclavicular and acromioclavicular intervals are maintained. Postsurgical changes are noted in the left axilla as well as postthoracotomy changes prior rib resection and several remote deformities of the left-sided ribs as well as suture material within the left lung. The aorta is calcified. IMPRESSION: 1. No acute osseous abnormality. 2. Mild acromioclavicular and glenohumeral degenerative changes. 3. Post thoracotomy changes left chest wall. Postsurgical changes left axilla. Electronically Signed   By: Lovena Le M.D.   On: 05/16/2019 16:13    Time Spent in minutes 35   Neomia Herbel M.D on 05/31/2019 at 1:41 PM  Between 7am to 7pm - Pager - [phone]  After 7pm go to [email or web address] - password [facility]  [facility] -  Office  [phone]

## 2019-05-31 NOTE — Progress Notes (Signed)
PHARMACY NOTE -  [place] has been assisting with dosing of Zosyn for IAI.  Dosage remains stable at 2.25 g IV q8 hr and need for further dosage adjustment appears unlikely at present given SCr at baseline  Pharmacy will sign off, following peripherally for culture results or dose adjustments. Please reconsult if a change in clinical status warrants re-evaluation of dosage.  Reuel Boom, PharmD, BCPS [phone] 05/31/2019, 12:42 PM

## 2019-06-01 DIAGNOSIS — I10 Essential (primary) hypertension: Secondary | ICD-10-CM

## 2019-06-01 DIAGNOSIS — R112 Nausea with vomiting, unspecified: Secondary | ICD-10-CM

## 2019-06-01 DIAGNOSIS — R195 Other fecal abnormalities: Secondary | ICD-10-CM

## 2019-06-01 DIAGNOSIS — R700 Elevated erythrocyte sedimentation rate: Secondary | ICD-10-CM

## 2019-06-01 DIAGNOSIS — K50019 Crohn's disease of small intestine with unspecified complications: Secondary | ICD-10-CM

## 2019-06-01 DIAGNOSIS — K529 Noninfective gastroenteritis and colitis, unspecified: Secondary | ICD-10-CM

## 2019-06-01 DIAGNOSIS — R197 Diarrhea, unspecified: Secondary | ICD-10-CM

## 2019-06-01 DIAGNOSIS — F329 Major depressive disorder, single episode, unspecified: Secondary | ICD-10-CM

## 2019-06-01 LAB — BASIC METABOLIC PANEL
Anion gap: 8 (ref 5–15)
BUN: 19 mg/dL (ref 8–23)
CO2: 27 mmol/L (ref 22–32)
Calcium: 7.7 mg/dL — ABNORMAL LOW (ref 8.9–10.3)
Chloride: 107 mmol/L (ref 98–111)
Creatinine, Ser: 2.15 mg/dL — ABNORMAL HIGH (ref 0.44–1.00)
GFR calc Af Amer: 24 mL/min — ABNORMAL LOW (ref >60)
GFR calc non Af Amer: 21 mL/min — ABNORMAL LOW (ref >60)
Glucose, Bld: 87 mg/dL (ref 70–99)
Potassium: 4.3 mmol/L (ref 3.5–5.1)
Sodium: 142 mmol/L (ref 135–145)

## 2019-06-01 LAB — CBC
HCT: 29.1 % — ABNORMAL LOW (ref 36.0–46.0)
Hemoglobin: 9.3 g/dL — ABNORMAL LOW (ref 12.0–15.0)
MCH: 30.3 pg (ref 26.0–34.0)
MCHC: 32 g/dL (ref 30.0–36.0)
MCV: 94.8 fL (ref 80.0–100.0)
Platelets: 334 10*3/uL (ref 150–400)
RBC: 3.07 MIL/uL — ABNORMAL LOW (ref 3.87–5.11)
RDW: 21.5 % — ABNORMAL HIGH (ref 11.5–15.5)
WBC: 5.8 10*3/uL (ref 4.0–10.5)
nRBC: 0 % (ref 0.0–0.2)

## 2019-06-01 LAB — SEDIMENTATION RATE: Sed Rate: 55 mm/hr — ABNORMAL HIGH (ref 0–22)

## 2019-06-01 LAB — GLUCOSE, CAPILLARY
Glucose-Capillary: 90 mg/dL (ref 70–99)
Glucose-Capillary: 94 mg/dL (ref 70–99)
Glucose-Capillary: 83 mg/dL (ref 70–99)
Glucose-Capillary: 97 mg/dL (ref 70–99)

## 2019-06-01 LAB — C-REACTIVE PROTEIN: CRP: 4.6 mg/dL — ABNORMAL HIGH (ref <1.0)

## 2019-06-01 NOTE — Progress Notes (Signed)
RN got a call from lab to confirm that the Quantiferon TB Gold test has done from Monday to Thursday  So they will re-schedule this order on Monday morning 06/04/2019.

## 2019-06-01 NOTE — Care Management Important Message (Signed)
Important Message  Patient Details IM Letter given to Evelina Dun RN to present to the Patient Name: Samantha Mckenzie MRN: [id number] Date of Birth: [birth year]   Medicare Important Message Given:  Yes     Kerin Salen 06/01/2019, 10:27 AM

## 2019-06-01 NOTE — Progress Notes (Signed)
Patient's son requested that patient should go to SNF at discharge and ask for a phone call from MD. Paged MD at [phone].

## 2019-06-01 NOTE — Consult Note (Signed)
   [facility] Inpatient Consult   06/01/2019  Samantha Mckenzie [birth year] [id number]   Patient screened for potential [facility] Management services due to unplanned readmission risk score of 43% and multiple hospitalizations.   Per chart review, [facility] RN community care manager has spoken with patient's son on 8/19 and has declined [facility] CM services at this time.  Netta Cedars, MSN, [facility] Liaison Nurse Mobile Phone [phone]  Toll free office [phone]

## 2019-06-01 NOTE — Progress Notes (Addendum)
Progress Note   Attending physician's note   I have taken an interval history, reviewed the chart and examined the patient. I agree with the Advanced Practitioner's note, impression and recommendations.   83 year old female with recurrent abdominal pain, nausea, vomiting and diarrhea. CT with findings suggestive of terminal ileitis and dilation of proximal ileum  Clinically she is feeling better and AKI improving  Persistent diarrhea, 2 liquid BM so far  Differential includes IBD versus lymphoma versus gastrointestinal TB  ESR elevated at 55  We will check TB QuantiFERON  Patient is reluctant to do bowel prep and colonoscopy.    If TB QuantiFERON negative and patient/family does not want to pursue colonoscopy can consider starting prednisone for empirical therapy  Dr. Collene Mares will be covering inpatient GI for [place] this weekend   K. Denzil Magnuson , MD [phone]     ASSESSMENT AND PLAN:   75. 83 yo female with N/V/D, lower abdominal pain and persistent ileitis on CT scan. Doubt infectious etiology. Suspicion is for Crohn's disease.  --May not be able to get stool studies done if not having diarrhea.   --Await CRP, ESR --Ideally can have colonoscopy. Will need to talk with family. Psychiatry evaluated patient yesterday, she doesn't have capacity to make medical decisions for herself.     2. Acute on chronic [place] anemia, not iron deficient by labs. Hgb down from ~9 to 6.8 yesterday. Though she has been having some intermittent rectal bleeding the ~ 2 gram hgb drop is questionable as hgb up > 2 grams with just one [facility] yesterday.   3. AKI, improving. Cr 2.15    SUBJECTIVE    No complaints. No abdominal pain. No diarrhea. Liquid tray untouched, says she has no appetite   OBJECTIVE:     Vital signs in last 24 hours: Temp:  [98.1 F (36.7 C)-98.8 F (37.1 C)] 98.3 F (36.8 C) (08/21 0504) Pulse Rate:  [68-77] 68 (08/21 0504) Resp:  [12-18] 18 (08/21 0504)  BP: (120-151)/(45-58) 151/58 (08/21 0504) SpO2:  [90 %-100 %] 100 % (08/21 0504) Weight:  [54 kg] 54 kg (08/21 0504) Last BM Date: 05/30/19 General:   Alert, well-developed female in NAD Heart:  Regular rate and rhythm;  No lower extremity edema   Pulm: Normal respiratory effort Abdomen:  Soft, nondistended, nontender.  Normal bowel sounds.  Neurologic:  Alert and  oriented Psych:  Pleasant, cooperative.  .   Intake/Output from previous day: 08/20 0701 - 08/21 0700 In: 325 [I.V.:10; Blood:315] Out: -  Intake/Output this shift: No intake/output data recorded.  Lab Results: Recent Labs    05/30/19 0616  05/31/19 0717 05/31/19 2246 06/01/19 0502  WBC 10.3  --  6.1  --  5.8  HGB 8.8*   < > 6.8* 10.8* 9.3*  HCT 26.2*   < > 21.4* 33.3* 29.1*  PLT 483*  --  366  --  334   < > = values in this interval not displayed.   BMET Recent Labs    05/30/19 0616 05/30/19 0654 05/31/19 0717 06/01/19 0502  NA 134* 135 139 142  K 3.0* 3.0* 2.9* 4.3  CL 98 99 97* 107  CO2 18*  --  32 27  GLUCOSE 171* 168* 90 87  BUN 34* 27* 27* 19  CREATININE 2.57* 2.40* 2.14* 2.15*  CALCIUM 7.8*  --  7.0* 7.7*   LFT Recent Labs    05/31/19 0717  PROT 5.6*  ALBUMIN 2.1*  AST 13*  ALT 11  ALKPHOS 62  BILITOT 0.5   PT/INR Recent Labs    05/30/19 0616  LABPROT 12.7  INR 1.0   Hepatitis Panel No results for input(s): HEPBSAG, HCVAB, HEPAIGM, HEPBIGM in the last 72 hours.  Ct Abdomen Pelvis Wo Contrast  Result Date: 05/30/2019 CLINICAL DATA:  Generalized abdominal pain, nausea EXAM: CT ABDOMEN AND PELVIS WITHOUT CONTRAST TECHNIQUE: Multidetector CT imaging of the abdomen and pelvis was performed following the standard protocol without IV contrast. COMPARISON:  04/20/2019 FINDINGS: Lower chest: No acute abnormality. Hepatobiliary: No focal liver abnormality is seen. No gallstones, gallbladder wall thickening, or biliary dilatation. Pancreas: Unremarkable. No pancreatic ductal dilatation or  surrounding inflammatory changes. Spleen: Scattered calcified granulomas within the spleen. No acute findings. Adrenals/Urinary Tract: Unremarkable adrenal glands. Bilateral renal cortical atrophy. Stable appearance of bilateral renal cysts. Extrarenal pelvis is noted bilaterally. No hydronephrosis. Ureters nondilated. Urinary bladder is moderately distended without acute abnormality. Stomach/Bowel: There is long segment wall thickening with mild surrounding fat stranding of the terminal ileum with area of caliber change in the right lower quadrant (series 2, image 49) and mild distension of the upstream ilium measuring up to 2.9 cm in diameter. Remaining small bowel is within normal limits. Scattered colonic diverticulosis. No pericolonic inflammatory changes. Small hiatal hernia. Vascular/Lymphatic: Atherosclerotic calcification of the aortoiliac axis. No abdominal or pelvic lymphadenopathy identified. Reproductive: Uterus and bilateral adnexa are unremarkable. Other: No abdominal wall hernia or abnormality. No abdominopelvic ascites. No abscess. Musculoskeletal: Grade 1 anterolisthesis L4 on L5. No acute osseous findings. IMPRESSION: 1. Long segment wall thickening and surrounding mild inflammatory changes of the terminal ileum suggesting terminal ileitis, which can be seen in the setting of inflammatory bowel disease. 2. There is focal caliber change proximally with mild distension of the more proximal ileum raising the possibility of stricture. No evidence to suggest small bowel obstruction. Electronically Signed   By: Davina Poke M.D.   On: 05/30/2019 11:25      Principal Problem:   Depression Active Problems:   Normocytic anemia   Essential hypertension   Generalized weakness   Nausea vomiting and diarrhea   Acute renal failure superimposed on stage 4 chronic kidney disease (HCC)   Lactic acidosis   Hypokalemia   Terminal ileitis with complication (HCC)   Ileitis   Dehydration   Acute  blood loss anemia     LOS: 2 days   Tye Savoy ,NP 06/01/2019, 8:42 AM

## 2019-06-01 NOTE — Progress Notes (Addendum)
PROGRESS NOTE                                                                                                                                                                                                             Patient Demographics:    Samantha Mckenzie, is a 83 y.o. female, DOB - 11-04-1935, SLP:[id number]  Admit date - 05/30/2019   Admitting Physician Kayleen Memos, DO  Outpatient Primary MD for the patient is Colon Branch, MD  LOS - 2  Outpatient Specialists:  Chief Complaint  Patient presents with   Nausea       Brief Narrative   83 year old female with history of GERD, prior history of focal enteritis versus?  IBD, type 2 diabetes mellitus with peripheral neuropathy, COPD, chronic diastolic CHF, breast cancer status post left mastectomy was brought to the ED from home with diffuse abdominal pain, nausea, vomiting and diarrhea.  Also reported frank blood in stool, poor p.o. intake. In the ED she was found to be in early sepsis with tachycardia, tachypnea, hypertensive, AKI and hypokalemia.  Also had elevated lactic acid of 2.9.  CT abdomen pelvis without contrast showed findings of terminal ileitis.  Admitted to hospital service on empiric antibiotics.   Subjective:   Reports poor appetite.  Reportedly also had loose bowel movements x2 this morning.   Assessment  & Plan :   Sepsis with terminal ileitis, infectious versus inflammatory. Sepsis resolved.  Continue empiric IV Zosyn. Advance diet. GI following and recommends less likely IBD versus gastrointestinal TB versus lymphoma.  ESR is elevated at 55.  Ordered for TB QuantiFERON. Patient was hesitant for bowel prep and colonoscopy. If TB QuantiFERON negative and patient and family do not want to pursue colonoscopy GI considering starting prednisone for empiric therapy.  (spoke with husband on the phone who agrees for her getting the colonoscopy for  diagnosis)    Acute blood loss anemia Has baseline normocytic anemia with hemoglobin around 8.5.  Dropped to 6.8 this admission.  Significantly improved with 1 unit PRBC.  Monitor daily  Acute on chronic kidney disease stage III Possibly prerenal with dehydration.  Baseline creatinine around 2, worsened to 2.57 on presentation.  Improved with IV fluids  Hypokalemia Replenished.  Magnesium normal.  Failure to thrive and poor p.o. intake PT and nutrition consult  Type 2  diabetes mellitus, controlled A1c of 5.2.  Monitor on sliding scale coverage   ?  Depression and frontotemporal dementia. PCP had concern brought up by family that patient was not adherent to treatment regimen.  She tells me that she was having frequent dizzy spells and and headache and feeling depressed for past several weeks.  Reported feeling worthless but denied any suicidal ideation.  She is on Lexapro and dose was recently increased by her PCP.   Psych consult appreciated. Agrees with increased dose of lexapro. QTc normal.   Psych recommends patient does not have capacity to make complex medical decisions for herself. Will ask family to get involved.    Essential hypertension Continue amlodipine  Dyslipidemia Continue statin  Code Status : DNR  Family Communication  : None at bedside. Will update family  Disposition Plan  : Home pending colonoscopy and clinical improvement. possibly 24-48 hrs  Barriers For Discharge : Active symptoms Consults  : GI  Procedures  : CT abdomen  DVT Prophylaxis  : SCDs  Lab Results  Component Value Date   PLT 334 06/01/2019    Antibiotics  :    Anti-infectives (From admission, onward)   Start     Dose/Rate Route Frequency Ordered Stop   05/30/19 1400  piperacillin-tazobactam (ZOSYN) IVPB 2.25 g     2.25 g 100 mL/hr over 30 Minutes Intravenous Every 8 hours 05/30/19 1343          Objective:   Vitals:   05/31/19 1938 06/01/19 0504 06/01/19 1202 06/01/19  1455  BP: (!) 133/51 (!) 151/58 (!) 151/58 136/72  Pulse: 70 68 68 77  Resp: 12 18 18 20   Temp: 98.1 F (36.7 C) 98.3 F (36.8 C) 98.3 F (36.8 C) 98.4 F (36.9 C)  TempSrc: Oral  Oral Oral  SpO2: 90% 100%  100%  Weight:  54 kg 54 kg   Height:   5' 2.99" (1.6 m)     Wt Readings from Last 3 Encounters:  06/01/19 54 kg  05/24/19 59 kg  05/16/19 59 kg     Intake/Output Summary (Last 24 hours) at 06/01/2019 1536 Last data filed at 06/01/2019 1456 Gross per 24 hour  Intake 920 ml  Output 800 ml  Net 120 ml    Physical exam: Elderly female in AND HEENT: Pallor+, moist mucosa  chest: clear  CVS: N S1&S2 GI: soft, NT, ND  musculoskeletal: warm, no edema     Data Review:    CBC Recent Labs  Lab 05/30/19 0616 05/30/19 0654 05/31/19 0717 05/31/19 2246 06/01/19 0502  WBC 10.3  --  6.1  --  5.8  HGB 8.8* 9.2* 6.8* 10.8* 9.3*  HCT 26.2* 27.0* 21.4* 33.3* 29.1*  PLT 483*  --  366  --  334  MCV 92.9  --  97.3  --  94.8  MCH 31.2  --  30.9  --  30.3  MCHC 33.6  --  31.8  --  32.0  RDW 21.0*  --  21.7*  --  21.5*  LYMPHSABS 0.7  --  1.6  --   --   MONOABS 0.5  --  0.5  --   --   EOSABS 0.0  --  0.1  --   --   BASOSABS 0.0  --  0.0  --   --     Chemistries  Recent Labs  Lab 05/30/19 0616 05/30/19 0654 05/31/19 0717 06/01/19 0502  NA 134* 135 139 142  K 3.0* 3.0*  2.9* 4.3  CL 98 99 97* 107  CO2 18*  --  32 27  GLUCOSE 171* 168* 90 87  BUN 34* 27* 27* 19  CREATININE 2.57* 2.40* 2.14* 2.15*  CALCIUM 7.8*  --  7.0* 7.7*  MG  --   --  1.9  --   AST 20  --  13*  --   ALT 15  --  11  --   ALKPHOS 76  --  62  --   BILITOT 1.1  --  0.5  --    ------------------------------------------------------------------------------------------------------------------ No results for input(s): CHOL, HDL, LDLCALC, TRIG, CHOLHDL, LDLDIRECT in the last 72 hours.  Lab Results  Component Value Date   HGBA1C 5.2 05/30/2019    ------------------------------------------------------------------------------------------------------------------ No results for input(s): TSH, T4TOTAL, T3FREE, THYROIDAB in the last 72 hours.  Invalid input(s): FREET3 ------------------------------------------------------------------------------------------------------------------ No results for input(s): VITAMINB12, FOLATE, FERRITIN, TIBC, IRON, RETICCTPCT in the last 72 hours.  Coagulation profile Recent Labs  Lab 05/30/19 0616  INR 1.0    No results for input(s): DDIMER in the last 72 hours.  Cardiac Enzymes No results for input(s): CKMB, TROPONINI, MYOGLOBIN in the last 168 hours.  Invalid input(s): CK ------------------------------------------------------------------------------------------------------------------    Component Value Date/Time   BNP 294.1 (H) 04/07/2019 2157    Inpatient Medications  Scheduled Meds:  amLODipine  10 mg Oral Daily   atorvastatin  20 mg Oral Daily   cholecalciferol  1,000 Units Oral Daily   dicyclomine  20 mg Oral TID AC & HS   escitalopram  20 mg Oral Daily   ferrous sulfate  325 mg Oral Q breakfast   insulin aspart  0-5 Units Subcutaneous QHS   insulin aspart  0-9 Units Subcutaneous TID WC   mirtazapine  15 mg Oral QHS   pantoprazole  40 mg Oral Daily   vitamin B-12  1,000 mcg Oral Daily   Continuous Infusions:  piperacillin-tazobactam (ZOSYN)  IV 2.25 g (06/01/19 1323)   PRN Meds:.hydrOXYzine, loperamide, polyethylene glycol  Micro Results Recent Results (from the past 240 hour(s))  SARS Coronavirus 2 [facility] order, Performed in [facility] hospital lab) Nasopharyngeal Nasopharyngeal Swab     Status: None   Collection Time: 05/24/19 12:41 PM   Specimen: Nasopharyngeal Swab  Result Value Ref Range Status   SARS Coronavirus 2 NEGATIVE NEGATIVE Final    Comment: (NOTE) If result is NEGATIVE SARS-CoV-2 target nucleic acids are NOT DETECTED. The SARS-CoV-2  RNA is generally detectable in upper and lower  respiratory specimens during the acute phase of infection. The lowest  concentration of SARS-CoV-2 viral copies this assay can detect is 250  copies / mL. A negative result does not preclude SARS-CoV-2 infection  and should not be used as the sole basis for treatment or other  patient management decisions.  A negative result may occur with  improper specimen collection / handling, submission of specimen other  than nasopharyngeal swab, presence of viral mutation(s) within the  areas targeted by this assay, and inadequate number of viral copies  (<250 copies / mL). A negative result must be combined with clinical  observations, patient history, and epidemiological information. If result is POSITIVE SARS-CoV-2 target nucleic acids are DETECTED. The SARS-CoV-2 RNA is generally detectable in upper and lower  respiratory specimens dur ing the acute phase of infection.  Positive  results are indicative of active infection with SARS-CoV-2.  Clinical  correlation with patient history and other diagnostic information is  necessary to determine patient infection status.  Positive results do  not rule out bacterial infection or co-infection with other viruses. If result is PRESUMPTIVE POSTIVE SARS-CoV-2 nucleic acids MAY BE PRESENT.   A presumptive positive result was obtained on the submitted specimen  and confirmed on repeat testing.  While 2019 novel coronavirus  (SARS-CoV-2) nucleic acids may be present in the submitted sample  additional confirmatory testing may be necessary for epidemiological  and / or clinical management purposes  to differentiate between  SARS-CoV-2 and other Sarbecovirus currently known to infect humans.  If clinically indicated additional testing with an alternate test  methodology [phone]) is advised. The SARS-CoV-2 RNA is generally  detectable in upper and lower respiratory sp ecimens during the acute  phase of  infection. The expected result is Negative. Fact Sheet for Patients:  StrictlyIdeas.no Fact Sheet for Healthcare Providers: BankingDealers.co.za This test is not yet approved or cleared by the [place] FDA and has been authorized for detection and/or diagnosis of SARS-CoV-2 by FDA under an Emergency Use Authorization (EUA).  This EUA will remain in effect (meaning this test can be used) for the duration of the COVID-19 declaration under Section 564(b)(1) of the Act, 21 U.S.C. section 360bbb-3(b)(1), unless the authorization is terminated or revoked sooner. Performed at [facility], [place] [address], [place], [place] [address]   Blood culture (routine x 2)     Status: None (Preliminary result)   Collection Time: 05/30/19  6:16 AM   Specimen: BLOOD  Result Value Ref Range Status   Specimen Description   Final    BLOOD BLOOD RIGHT FOREARM Performed at [place] [address], [place], [place] [address]    Special Requests   Final    BOTTLES DRAWN AEROBIC ONLY Blood Culture adequate volume Performed at [facility] [address], [place], [place] [address]    Culture   Final    NO GROWTH 2 DAYS Performed at [facility] [address] [place], [place] [address]    Report Status PENDING  Incomplete  Blood culture (routine x 2)     Status: None (Preliminary result)   Collection Time: 05/30/19  6:44 AM   Specimen: BLOOD  Result Value Ref Range Status   Specimen Description   Final    BLOOD LEFT ANTECUBITAL Performed at [facility] [address], [place], [place] [address]    Special Requests   Final    BOTTLES DRAWN AEROBIC AND ANAEROBIC Blood Culture results may not be optimal due to an excessive volume of blood received in culture bottles Performed at [facility] [address], [place], [place] [address]    Culture   Final     NO GROWTH 2 DAYS Performed at [facility] [address] [place], [place] [address]    Report Status PENDING  Incomplete  SARS Coronavirus 2 [facility] order, Performed in [facility] hospital lab) Nasopharyngeal Nasopharyngeal Swab     Status: None   Collection Time: 05/30/19 12:16 PM   Specimen: Nasopharyngeal Swab  Result Value Ref Range Status   SARS Coronavirus 2 NEGATIVE NEGATIVE Final    Comment: (NOTE) If result is NEGATIVE SARS-CoV-2 target nucleic acids are NOT DETECTED. The SARS-CoV-2 RNA is generally detectable in upper and lower  respiratory specimens during the acute phase of infection. The lowest  concentration of SARS-CoV-2 viral copies this assay can detect is 250  copies / mL. A negative result does not preclude SARS-CoV-2  infection  and should not be used as the sole basis for treatment or other  patient management decisions.  A negative result may occur with  improper specimen collection / handling, submission of specimen other  than nasopharyngeal swab, presence of viral mutation(s) within the  areas targeted by this assay, and inadequate number of viral copies  (<250 copies / mL). A negative result must be combined with clinical  observations, patient history, and epidemiological information. If result is POSITIVE SARS-CoV-2 target nucleic acids are DETECTED. The SARS-CoV-2 RNA is generally detectable in upper and lower  respiratory specimens dur ing the acute phase of infection.  Positive  results are indicative of active infection with SARS-CoV-2.  Clinical  correlation with patient history and other diagnostic information is  necessary to determine patient infection status.  Positive results do  not rule out bacterial infection or co-infection with other viruses. If result is PRESUMPTIVE POSTIVE SARS-CoV-2 nucleic acids MAY BE PRESENT.   A presumptive positive result was obtained on the submitted specimen  and confirmed on repeat testing.  While  2019 novel coronavirus  (SARS-CoV-2) nucleic acids may be present in the submitted sample  additional confirmatory testing may be necessary for epidemiological  and / or clinical management purposes  to differentiate between  SARS-CoV-2 and other Sarbecovirus currently known to infect humans.  If clinically indicated additional testing with an alternate test  methodology [phone]) is advised. The SARS-CoV-2 RNA is generally  detectable in upper and lower respiratory sp ecimens during the acute  phase of infection. The expected result is Negative. Fact Sheet for Patients:  StrictlyIdeas.no Fact Sheet for Healthcare Providers: BankingDealers.co.za This test is not yet approved or cleared by the [place] FDA and has been authorized for detection and/or diagnosis of SARS-CoV-2 by FDA under an Emergency Use Authorization (EUA).  This EUA will remain in effect (meaning this test can be used) for the duration of the COVID-19 declaration under Section 564(b)(1) of the Act, 21 U.S.C. section 360bbb-3(b)(1), unless the authorization is terminated or revoked sooner. Performed at [facility], [place] [address], [place], [place] [address]     Radiology Reports Ct Abdomen Pelvis Wo Contrast  Result Date: 05/30/2019 CLINICAL DATA:  Generalized abdominal pain, nausea EXAM: CT ABDOMEN AND PELVIS WITHOUT CONTRAST TECHNIQUE: Multidetector CT imaging of the abdomen and pelvis was performed following the standard protocol without IV contrast. COMPARISON:  04/20/2019 FINDINGS: Lower chest: No acute abnormality. Hepatobiliary: No focal liver abnormality is seen. No gallstones, gallbladder wall thickening, or biliary dilatation. Pancreas: Unremarkable. No pancreatic ductal dilatation or surrounding inflammatory changes. Spleen: Scattered calcified granulomas within the spleen. No acute findings. Adrenals/Urinary Tract: Unremarkable adrenal  glands. Bilateral renal cortical atrophy. Stable appearance of bilateral renal cysts. Extrarenal pelvis is noted bilaterally. No hydronephrosis. Ureters nondilated. Urinary bladder is moderately distended without acute abnormality. Stomach/Bowel: There is long segment wall thickening with mild surrounding fat stranding of the terminal ileum with area of caliber change in the right lower quadrant (series 2, image 49) and mild distension of the upstream ilium measuring up to 2.9 cm in diameter. Remaining small bowel is within normal limits. Scattered colonic diverticulosis. No pericolonic inflammatory changes. Small hiatal hernia. Vascular/Lymphatic: Atherosclerotic calcification of the aortoiliac axis. No abdominal or pelvic lymphadenopathy identified. Reproductive: Uterus and bilateral adnexa are unremarkable. Other: No abdominal wall hernia or abnormality. No abdominopelvic ascites. No abscess. Musculoskeletal: Grade 1 anterolisthesis L4 on L5. No acute osseous findings. IMPRESSION: 1. Long segment wall thickening and surrounding  mild inflammatory changes of the terminal ileum suggesting terminal ileitis, which can be seen in the setting of inflammatory bowel disease. 2. There is focal caliber change proximally with mild distension of the more proximal ileum raising the possibility of stricture. No evidence to suggest small bowel obstruction. Electronically Signed   By: Davina Poke M.D.   On: 05/30/2019 11:25   Dg Chest 2 View  Result Date: 05/16/2019 CLINICAL DATA:  Altered mental status, left-sided deficit beginning at 11 a.m. this morning. Left shoulder pain EXAM: CHEST - 2 VIEW COMPARISON:  Numerous priors, most recent March 19, 2019 FINDINGS: Streaky bandlike areas of atelectasis are present right mid lung and left lung base. Postsurgical changes are present the left lung. The lungs are otherwise clear. No pneumothorax. No effusions. Stable appearance of the cardiomediastinal contours including rightward  deviation of the trachea. Surgical clips project over the left axilla. No acute osseous or soft tissue abnormality. Postthoracotomy changes with prior left rib resection and remote rib fractures. Surgical clips project in the left axilla. IMPRESSION: Bilateral atelectasis. No acute cardiopulmonary disease. Electronically Signed   By: Lovena Le M.D.   On: 05/16/2019 16:12   Ct Head Wo Contrast  Result Date: 05/30/2019 CLINICAL DATA:  Altered level of consciousness EXAM: CT HEAD WITHOUT CONTRAST CT CERVICAL SPINE WITHOUT CONTRAST TECHNIQUE: Multidetector CT imaging of the head and cervical spine was performed following the standard protocol without intravenous contrast. Multiplanar CT image reconstructions of the cervical spine were also generated. COMPARISON:  05/24/2019 FINDINGS: CT HEAD FINDINGS Brain: No evidence of acute infarction, hemorrhage, extra-axial collection, ventriculomegaly, or mass effect. Generalized cerebral atrophy. Periventricular white matter low attenuation likely secondary to microangiopathy. Vascular: Cerebrovascular atherosclerotic calcifications are noted. Skull: Negative for fracture or focal lesion. Sinuses/Orbits: Visualized portions of the orbits are unremarkable. Visualized portions of the paranasal sinuses and mastoid air cells are unremarkable. Other: Scalp contusion along the left vertex. CT CERVICAL SPINE FINDINGS Alignment: Normal. Skull base and vertebrae: No acute fracture. No primary bone lesion or focal pathologic process. Erosion along the anterior aspect of the dens as can be seen with crystalline arthropathy. Soft tissues and spinal canal: No prevertebral fluid or swelling. No visible canal hematoma. Disc levels: Degenerative disc disease with disc height loss at C4-5 and C5-6. Right uncovertebral degenerative changes at C4-5 and C5-6 resulting in foraminal stenosis. Mild left foraminal stenosis at C4-5. Upper chest: Left apical scarring. Other: Bilateral carotid  artery atherosclerosis. IMPRESSION: 1. No acute intracranial pathology. 2.  No acute osseous injury of the cervical spine. Electronically Signed   By: Kathreen Devoid   On: 05/30/2019 07:33   Ct Head Wo Contrast  Result Date: 05/24/2019 CLINICAL DATA:  Worsening headaches. Recent falls. Nausea and emesis today. EXAM: CT HEAD WITHOUT CONTRAST TECHNIQUE: Contiguous axial images were obtained from the base of the skull through the vertex without intravenous contrast. COMPARISON:  Head CT 05/16/2019 FINDINGS: Brain: No intracranial hemorrhage, mass effect, or midline shift. Unchanged degree of atrophy and chronic small vessel ischemia. No hydrocephalus. The basilar cisterns are patent. No evidence of territorial infarct or acute ischemia. No extra-axial or intracranial fluid collection. Vascular: Atherosclerosis of skullbase vasculature without hyperdense vessel or abnormal calcification. Skull: No fracture or focal lesion. Sinuses/Orbits: Minor mucosal thickening of ethmoid air cells. No acute findings. No sinus fluid level. Bilateral lens extraction. Other: None. IMPRESSION: 1. No acute intracranial abnormality. 2. Unchanged atrophy and chronic small vessel ischemia. Electronically Signed   By: Aurther Loft.D.  On: 05/24/2019 16:48   Ct Head Wo Contrast  Result Date: 05/16/2019 CLINICAL DATA:  TIA, multiple falls, hit back of head EXAM: CT HEAD WITHOUT CONTRAST TECHNIQUE: Contiguous axial images were obtained from the base of the skull through the vertex without intravenous contrast. COMPARISON:  CT brain 05/01/2019 FINDINGS: Brain: No acute territorial infarction, hemorrhage or intracranial mass. Atrophy and minimal small vessel ischemic change of the white matter. Stable ventricle size. Vascular: No hyperdense vessels. Vertebral and carotid vascular calcification Skull: Normal. Negative for fracture or focal lesion. Sinuses/Orbits: No acute finding. Mild mucosal thickening in the maxillary and ethmoid  sinuses Other: None IMPRESSION: 1. No CT evidence for acute intracranial abnormality. 2. Atrophy and minimal small vessel ischemic changes of the white matter Electronically Signed   By: Donavan Foil M.D.   On: 05/16/2019 17:49   Ct Cervical Spine Wo Contrast  Result Date: 05/30/2019 CLINICAL DATA:  Altered level of consciousness EXAM: CT HEAD WITHOUT CONTRAST CT CERVICAL SPINE WITHOUT CONTRAST TECHNIQUE: Multidetector CT imaging of the head and cervical spine was performed following the standard protocol without intravenous contrast. Multiplanar CT image reconstructions of the cervical spine were also generated. COMPARISON:  05/24/2019 FINDINGS: CT HEAD FINDINGS Brain: No evidence of acute infarction, hemorrhage, extra-axial collection, ventriculomegaly, or mass effect. Generalized cerebral atrophy. Periventricular white matter low attenuation likely secondary to microangiopathy. Vascular: Cerebrovascular atherosclerotic calcifications are noted. Skull: Negative for fracture or focal lesion. Sinuses/Orbits: Visualized portions of the orbits are unremarkable. Visualized portions of the paranasal sinuses and mastoid air cells are unremarkable. Other: Scalp contusion along the left vertex. CT CERVICAL SPINE FINDINGS Alignment: Normal. Skull base and vertebrae: No acute fracture. No primary bone lesion or focal pathologic process. Erosion along the anterior aspect of the dens as can be seen with crystalline arthropathy. Soft tissues and spinal canal: No prevertebral fluid or swelling. No visible canal hematoma. Disc levels: Degenerative disc disease with disc height loss at C4-5 and C5-6. Right uncovertebral degenerative changes at C4-5 and C5-6 resulting in foraminal stenosis. Mild left foraminal stenosis at C4-5. Upper chest: Left apical scarring. Other: Bilateral carotid artery atherosclerosis. IMPRESSION: 1. No acute intracranial pathology. 2.  No acute osseous injury of the cervical spine. Electronically  Signed   By: Kathreen Devoid   On: 05/30/2019 07:33   Dg Chest Portable 1 View  Result Date: 05/30/2019 CLINICAL DATA:  Altered mental status EXAM: PORTABLE CHEST 1 VIEW COMPARISON:  05/16/2019 FINDINGS: The heart size and mediastinal contours are within normal limits. Both lungs are clear. Old left posterior rib fractures. Surgical clips in the left axilla. IMPRESSION: No active disease. Electronically Signed   By: Kathreen Devoid   On: 05/30/2019 06:57   Dg Shoulder Left  Result Date: 05/16/2019 CLINICAL DATA:  Altered mental status, left-sided deficits, left shoulder pain EXAM: LEFT SHOULDER - 2+ VIEW COMPARISON:  None. FINDINGS: No evidence of fracture or traumatic malalignment. Degenerative changes of the acromioclavicular and lesser changes at the glenohumeral joint. Coracoclavicular and acromioclavicular intervals are maintained. Postsurgical changes are noted in the left axilla as well as postthoracotomy changes prior rib resection and several remote deformities of the left-sided ribs as well as suture material within the left lung. The aorta is calcified. IMPRESSION: 1. No acute osseous abnormality. 2. Mild acromioclavicular and glenohumeral degenerative changes. 3. Post thoracotomy changes left chest wall. Postsurgical changes left axilla. Electronically Signed   By: Lovena Le M.D.   On: 05/16/2019 16:13    Time Spent in minutes 25  Cleston Lautner M.D on 06/01/2019 at 3:36 PM  Between 7am to 7pm - Pager - [phone]  After 7pm go to [email or web address] - password [facility]  [facility] -  Office  [phone]

## 2019-06-02 LAB — CBC
HCT: 30 % — ABNORMAL LOW (ref 36.0–46.0)
Hemoglobin: 9.7 g/dL — ABNORMAL LOW (ref 12.0–15.0)
MCH: 30.9 pg (ref 26.0–34.0)
MCHC: 32.3 g/dL (ref 30.0–36.0)
MCV: 95.5 fL (ref 80.0–100.0)
Platelets: 333 10*3/uL (ref 150–400)
RBC: 3.14 MIL/uL — ABNORMAL LOW (ref 3.87–5.11)
RDW: 20.8 % — ABNORMAL HIGH (ref 11.5–15.5)
WBC: 5.5 10*3/uL (ref 4.0–10.5)
nRBC: 0 % (ref 0.0–0.2)

## 2019-06-02 LAB — GLUCOSE, CAPILLARY
Glucose-Capillary: 85 mg/dL (ref 70–99)
Glucose-Capillary: 98 mg/dL (ref 70–99)
Glucose-Capillary: 96 mg/dL (ref 70–99)
Glucose-Capillary: 88 mg/dL (ref 70–99)

## 2019-06-02 LAB — BASIC METABOLIC PANEL
Anion gap: 12 (ref 5–15)
BUN: 19 mg/dL (ref 8–23)
CO2: 28 mmol/L (ref 22–32)
Calcium: 8.2 mg/dL — ABNORMAL LOW (ref 8.9–10.3)
Chloride: 104 mmol/L (ref 98–111)
Creatinine, Ser: 2.53 mg/dL — ABNORMAL HIGH (ref 0.44–1.00)
GFR calc Af Amer: 20 mL/min — ABNORMAL LOW (ref >60)
GFR calc non Af Amer: 17 mL/min — ABNORMAL LOW (ref >60)
Glucose, Bld: 86 mg/dL (ref 70–99)
Potassium: 3.9 mmol/L (ref 3.5–5.1)
Sodium: 144 mmol/L (ref 135–145)

## 2019-06-02 NOTE — Evaluation (Signed)
Physical Therapy Evaluation Patient Details Name: Samantha Mckenzie MRN: [id number] DOB: 1936/05/01 Today's Date: 06/02/2019   History of Present Illness  Pt admit with increased abdominal pain, recent fall and weakness and low hemaglobin. [facility] and did not stay, however willing to stay this time. Last time she stated "they never came to great me or do anything with me so I left".  Clinical Impression   Pt ambulated in room, turning using RW for about 50 feet. Tolerated well, still some slight weakness and fatigue with ambulation and cues for steering ( not used to using RW at this time). Could still benefit from [facility]  ( pt states agreeable to stay this time for rehab) to address overall strength for short term stay prior to DC home due to frequent hospitalizations, weakness and recent fall.     Follow Up Recommendations SNF(unless family able to help assist intially)    Equipment Recommendations  Other (comment)    Recommendations for Other Services       Precautions / Restrictions        Mobility  Bed Mobility Overal bed mobility: Needs Assistance Bed Mobility: Supine to Sit;Sit to Supine     Supine to sit: Min guard Sit to supine: Min guard      Transfers Overall transfer level: Needs assistance Equipment used: Rolling walker (2 wheeled) Transfers: Sit to/from Stand Sit to Stand: Min assist            Ambulation/Gait Ambulation/Gait assistance: Herbalist (Feet): 50 Feet Assistive device: Rolling walker (2 wheeled) Gait Pattern/deviations: Step-through pattern     General Gait Details: slight;y unsteady and difficulty with teering teh rW at times, but was doing laps in the room so entailed a lot of turning .  Stairs            Wheelchair Mobility    Modified Rankin (Stroke Patients Only)       Balance Overall balance assessment: Mild deficits observed, not formally tested                                            Pertinent Vitals/Pain Pain Assessment: No/denies pain    Home Living Family/patient expects to be discharged to:: Private residence Living Arrangements: Spouse/significant other Available Help at Discharge: Family Type of Home: House Home Access: Level entry     Home Layout: One level Home Equipment: Environmental consultant - 2 wheels;Walker - 4 wheels Additional Comments: Pt states husband can not assist, pt states "they help each other" He uses a RW and now I do too.    Prior Function Level of Independence: Independent with assistive device(s)         Comments: Pt reports using RW since last hospital admission     Hand Dominance        Extremity/Trunk Assessment        Lower Extremity Assessment Lower Extremity Assessment: Generalized weakness       Communication   Communication: No difficulties  Cognition Arousal/Alertness: Awake/alert Behavior During Therapy: WFL for tasks assessed/performed Overall Cognitive Status: Within Functional Limits for tasks assessed                                        General Comments  Exercises     Assessment/Plan    PT Assessment Patient needs continued PT services  PT Problem List Decreased strength;Decreased activity tolerance;Decreased balance;Decreased mobility       PT Treatment Interventions Gait training;Functional mobility training;Therapeutic activities;Therapeutic exercise;Balance training    PT Goals (Current goals can be found in the Care Plan section)  Acute Rehab PT Goals Patient Stated Goal: I will do what I need to this time. PT Goal Formulation: With patient Time For Goal Achievement: 06/15/19 Potential to Achieve Goals: Good    Frequency Min 3X/week(home versus SNF)   Barriers to discharge        Co-evaluation               AM-PAC PT "6 Clicks" Mobility  Outcome Measure Help needed turning from your back to your side while in a flat bed without using  bedrails?: None Help needed moving from lying on your back to sitting on the side of a flat bed without using bedrails?: None Help needed moving to and from a bed to a chair (including a wheelchair)?: A Little Help needed standing up from a chair using your arms (e.g., wheelchair or bedside chair)?: A Little Help needed to walk in hospital room?: A Little Help needed climbing 3-5 steps with a railing? : A Little 6 Click Score: 20    End of Session Equipment Utilized During Treatment: Gait belt Activity Tolerance: Patient tolerated treatment well Patient left: in bed;with bed alarm set Nurse Communication: Mobility status PT Visit Diagnosis: Unsteadiness on feet (R26.81);Repeated falls (R29.6);Muscle weakness (generalized) (M62.81)    Time: 5670-1410 PT Time Calculation (min) (ACUTE ONLY): 36 min   Charges:   PT Evaluation $PT Eval Low Complexity: 1 Low PT Treatments $Therapeutic Activity: 8-22 mins        Clide Dales, PT Acute Rehabilitation Services Pager: [phone] Office: [phone] 06/02/2019   Clide Dales 06/02/2019, 2:44 PM

## 2019-06-02 NOTE — Progress Notes (Signed)
PROGRESS NOTE                                                                                                                                                                                                             Patient Demographics:    Samantha Mckenzie, is a 83 y.o. female, DOB - 1936/09/25, NOB:[id number]  Admit date - 05/30/2019   Admitting Physician Kayleen Memos, DO  Outpatient Primary MD for the patient is Colon Branch, MD  LOS - 3  Outpatient Specialists:  Chief Complaint  Patient presents with   Nausea       Brief Narrative   83 year old female with history of GERD, prior history of focal enteritis versus?  IBD, type 2 diabetes mellitus with peripheral neuropathy, COPD, chronic diastolic CHF, breast cancer status post left mastectomy was brought to the ED from home with diffuse abdominal pain, nausea, vomiting and diarrhea.  Also reported frank blood in stool, poor p.o. intake. In the ED she was found to be in early sepsis with tachycardia, tachypnea, hypertensive, AKI and hypokalemia.  Also had elevated lactic acid of 2.9.  CT abdomen pelvis without contrast showed findings of terminal ileitis.  Admitted to hospital service on empiric antibiotics.   Subjective:   Seen and examined.  Had 1 loose bowel movement without blood.  Stool sample was not collected.   Assessment  & Plan :   Sepsis with terminal ileitis, infectious versus inflammatory. Sepsis resolved.  Empiric Zosyn. Tolerating advance diet. GI following and recommends less likely IBD versus gastrointestinal TB versus lymphoma.  ESR is elevated at 55.   TB QuantiFERON pending.  If negative GI plan on starting steroid. Family informed GI that they were not interested in pursuing any invasive procedure.  Made DNR.     Acute blood loss anemia Has baseline normocytic anemia with hemoglobin around 8.5.  Dropped to 6.8 this admission.  Improved with  1 unit PRBC.  Acute on chronic kidney disease stage III Possibly prerenal with dehydration.  Baseline creatinine around 2, worsened to 2.57 on presentation.  Improved with IV fluids  Hypokalemia Replenished.  Magnesium normal.  Failure to thrive and poor p.o. intake PT and nutrition consult  Type 2 diabetes mellitus, controlled A1c of 5.2.  Monitor on sliding scale coverage   ?  Depression and frontotemporal dementia. PCP  had concern brought up by family that patient was not adherent to treatment regimen.  She tells me that she was having frequent dizzy spells and and headache and feeling depressed for past several weeks.  Reported feeling worthless but denied any suicidal ideation.  She is on Lexapro and dose was recently increased by her PCP.   Psych consult appreciated. Agrees with increased dose of lexapro. QTc normal.   Psych recommends patient does not have capacity to make complex medical decisions for herself.  Family (husband and son) informed about this and are involved in her care planning.  Son told me that husband is the healthcare proxy.    Essential hypertension Continue amlodipine  Dyslipidemia Continue statin  Generalized weakness. Seen by PT and recommend short-term rehab.  Code Status : DNR  Family Communication  : Updated husband and son on the phone on 8/21.  Disposition Plan  : SNF possibly early next week.  Social work consulted.  Barriers For Discharge : Active symptoms Consults  : GI  Procedures  : CT abdomen  DVT Prophylaxis  : SCDs  Lab Results  Component Value Date   PLT 333 06/02/2019    Antibiotics  :    Anti-infectives (From admission, onward)   Start     Dose/Rate Route Frequency Ordered Stop   05/30/19 1400  piperacillin-tazobactam (ZOSYN) IVPB 2.25 g     2.25 g 100 mL/hr over 30 Minutes Intravenous Every 8 hours 05/30/19 1343          Objective:   Vitals:   06/01/19 1202 06/01/19 1455 06/01/19 2100 06/02/19 0543  BP:  (!) 151/58 136/72 (!) 145/59 133/62  Pulse: 68 77 68 71  Resp: 18 20 16 16   Temp: 98.3 F (36.8 C) 98.4 F (36.9 C) 98.9 F (37.2 C) 98.7 F (37.1 C)  TempSrc: Oral Oral Oral Oral  SpO2:  100% 99% 100%  Weight: 54 kg   53.3 kg  Height: 5' 2.99" (1.6 m)       Wt Readings from Last 3 Encounters:  06/02/19 53.3 kg  05/24/19 59 kg  05/16/19 59 kg     Intake/Output Summary (Last 24 hours) at 06/02/2019 1401 Last data filed at 06/02/2019 0830 Gross per 24 hour  Intake 337 ml  Output 552 ml  Net -215 ml   Physical exam Not in distress HEENT: Moist mucosa, supple neck, pallor present Chest: Clear CVS: Normal S1-S2 GI: Soft, nondistended, nontender Musculoskeletal: Warm, no edema      Data Review:    CBC Recent Labs  Lab 05/30/19 0616 05/30/19 0654 05/31/19 0717 05/31/19 2246 06/01/19 0502 06/02/19 0530  WBC 10.3  --  6.1  --  5.8 5.5  HGB 8.8* 9.2* 6.8* 10.8* 9.3* 9.7*  HCT 26.2* 27.0* 21.4* 33.3* 29.1* 30.0*  PLT 483*  --  366  --  334 333  MCV 92.9  --  97.3  --  94.8 95.5  MCH 31.2  --  30.9  --  30.3 30.9  MCHC 33.6  --  31.8  --  32.0 32.3  RDW 21.0*  --  21.7*  --  21.5* 20.8*  LYMPHSABS 0.7  --  1.6  --   --   --   MONOABS 0.5  --  0.5  --   --   --   EOSABS 0.0  --  0.1  --   --   --   BASOSABS 0.0  --  0.0  --   --   --  Chemistries  Recent Labs  Lab 05/30/19 0616 05/30/19 0654 05/31/19 0717 06/01/19 0502 06/02/19 0530  NA 134* 135 139 142 144  K 3.0* 3.0* 2.9* 4.3 3.9  CL 98 99 97* 107 104  CO2 18*  --  32 27 28  GLUCOSE 171* 168* 90 87 86  BUN 34* 27* 27* 19 19  CREATININE 2.57* 2.40* 2.14* 2.15* 2.53*  CALCIUM 7.8*  --  7.0* 7.7* 8.2*  MG  --   --  1.9  --   --   AST 20  --  13*  --   --   ALT 15  --  11  --   --   ALKPHOS 76  --  62  --   --   BILITOT 1.1  --  0.5  --   --    ------------------------------------------------------------------------------------------------------------------ No results for input(s): CHOL, HDL,  LDLCALC, TRIG, CHOLHDL, LDLDIRECT in the last 72 hours.  Lab Results  Component Value Date   HGBA1C 5.2 05/30/2019   ------------------------------------------------------------------------------------------------------------------ No results for input(s): TSH, T4TOTAL, T3FREE, THYROIDAB in the last 72 hours.  Invalid input(s): FREET3 ------------------------------------------------------------------------------------------------------------------ No results for input(s): VITAMINB12, FOLATE, FERRITIN, TIBC, IRON, RETICCTPCT in the last 72 hours.  Coagulation profile Recent Labs  Lab 05/30/19 0616  INR 1.0    No results for input(s): DDIMER in the last 72 hours.  Cardiac Enzymes No results for input(s): CKMB, TROPONINI, MYOGLOBIN in the last 168 hours.  Invalid input(s): CK ------------------------------------------------------------------------------------------------------------------    Component Value Date/Time   BNP 294.1 (H) 04/07/2019 2157    Inpatient Medications  Scheduled Meds:  amLODipine  10 mg Oral Daily   atorvastatin  20 mg Oral Daily   cholecalciferol  1,000 Units Oral Daily   dicyclomine  20 mg Oral TID AC & HS   escitalopram  20 mg Oral Daily   ferrous sulfate  325 mg Oral Q breakfast   insulin aspart  0-5 Units Subcutaneous QHS   insulin aspart  0-9 Units Subcutaneous TID WC   mirtazapine  15 mg Oral QHS   pantoprazole  40 mg Oral Daily   vitamin B-12  1,000 mcg Oral Daily   Continuous Infusions:  piperacillin-tazobactam (ZOSYN)  IV 2.25 g (06/02/19 0611)   PRN Meds:.hydrOXYzine, loperamide, polyethylene glycol  Micro Results Recent Results (from the past 240 hour(s))  SARS Coronavirus 2 [facility] order, Performed in [facility] hospital lab) Nasopharyngeal Nasopharyngeal Swab     Status: None   Collection Time: 05/24/19 12:41 PM   Specimen: Nasopharyngeal Swab  Result Value Ref Range Status   SARS Coronavirus 2 NEGATIVE NEGATIVE  Final    Comment: (NOTE) If result is NEGATIVE SARS-CoV-2 target nucleic acids are NOT DETECTED. The SARS-CoV-2 RNA is generally detectable in upper and lower  respiratory specimens during the acute phase of infection. The lowest  concentration of SARS-CoV-2 viral copies this assay can detect is 250  copies / mL. A negative result does not preclude SARS-CoV-2 infection  and should not be used as the sole basis for treatment or other  patient management decisions.  A negative result may occur with  improper specimen collection / handling, submission of specimen other  than nasopharyngeal swab, presence of viral mutation(s) within the  areas targeted by this assay, and inadequate number of viral copies  (<250 copies / mL). A negative result must be combined with clinical  observations, patient history, and epidemiological information. If result is POSITIVE SARS-CoV-2 target nucleic acids are DETECTED. The SARS-CoV-2  RNA is generally detectable in upper and lower  respiratory specimens dur ing the acute phase of infection.  Positive  results are indicative of active infection with SARS-CoV-2.  Clinical  correlation with patient history and other diagnostic information is  necessary to determine patient infection status.  Positive results do  not rule out bacterial infection or co-infection with other viruses. If result is PRESUMPTIVE POSTIVE SARS-CoV-2 nucleic acids MAY BE PRESENT.   A presumptive positive result was obtained on the submitted specimen  and confirmed on repeat testing.  While 2019 novel coronavirus  (SARS-CoV-2) nucleic acids may be present in the submitted sample  additional confirmatory testing may be necessary for epidemiological  and / or clinical management purposes  to differentiate between  SARS-CoV-2 and other Sarbecovirus currently known to infect humans.  If clinically indicated additional testing with an alternate test  methodology [phone]) is advised. The  SARS-CoV-2 RNA is generally  detectable in upper and lower respiratory sp ecimens during the acute  phase of infection. The expected result is Negative. Fact Sheet for Patients:  StrictlyIdeas.no Fact Sheet for Healthcare Providers: BankingDealers.co.za This test is not yet approved or cleared by the [place] FDA and has been authorized for detection and/or diagnosis of SARS-CoV-2 by FDA under an Emergency Use Authorization (EUA).  This EUA will remain in effect (meaning this test can be used) for the duration of the COVID-19 declaration under Section 564(b)(1) of the Act, 21 U.S.C. section 360bbb-3(b)(1), unless the authorization is terminated or revoked sooner. Performed at [facility], [place] [address] [place], [place] [address]   Blood culture (routine x 2)     Status: None (Preliminary result)   Collection Time: 05/30/19  6:16 AM   Specimen: BLOOD  Result Value Ref Range Status   Specimen Description   Final    BLOOD BLOOD RIGHT FOREARM Performed at [place] [address] [place], [place] [address]    Special Requests   Final    BOTTLES DRAWN AEROBIC ONLY Blood Culture adequate volume Performed at [place] [address], [place], [place] [address]    Culture   Final    NO GROWTH 3 DAYS Performed at [facility], [place] [address] [place], [place] [address]    Report Status PENDING  Incomplete  Blood culture (routine x 2)     Status: None (Preliminary result)   Collection Time: 05/30/19  6:44 AM   Specimen: BLOOD  Result Value Ref Range Status   Specimen Description   Final    BLOOD LEFT ANTECUBITAL Performed at [place] [address], [place], [place] [address]    Special Requests   Final    BOTTLES DRAWN AEROBIC AND ANAEROBIC Blood Culture results may not be optimal due to an excessive volume of blood received in culture  bottles Performed at [place] [address], [place], [place] [address]    Culture   Final    NO GROWTH 3 DAYS Performed at [facility], [place] [address] [place], [place] [address]    Report Status PENDING  Incomplete  SARS Coronavirus 2 [facility] order, Performed in [facility] hospital lab) Nasopharyngeal Nasopharyngeal Swab     Status: None   Collection Time: 05/30/19 12:16 PM   Specimen: Nasopharyngeal Swab  Result Value Ref Range Status   SARS Coronavirus 2 NEGATIVE NEGATIVE Final    Comment: (NOTE) If result is NEGATIVE SARS-CoV-2 target  nucleic acids are NOT DETECTED. The SARS-CoV-2 RNA is generally detectable in upper and lower  respiratory specimens during the acute phase of infection. The lowest  concentration of SARS-CoV-2 viral copies this assay can detect is 250  copies / mL. A negative result does not preclude SARS-CoV-2 infection  and should not be used as the sole basis for treatment or other  patient management decisions.  A negative result may occur with  improper specimen collection / handling, submission of specimen other  than nasopharyngeal swab, presence of viral mutation(s) within the  areas targeted by this assay, and inadequate number of viral copies  (<250 copies / mL). A negative result must be combined with clinical  observations, patient history, and epidemiological information. If result is POSITIVE SARS-CoV-2 target nucleic acids are DETECTED. The SARS-CoV-2 RNA is generally detectable in upper and lower  respiratory specimens dur ing the acute phase of infection.  Positive  results are indicative of active infection with SARS-CoV-2.  Clinical  correlation with patient history and other diagnostic information is  necessary to determine patient infection status.  Positive results do  not rule out bacterial infection or co-infection with other viruses. If result is PRESUMPTIVE POSTIVE SARS-CoV-2 nucleic acids MAY BE  PRESENT.   A presumptive positive result was obtained on the submitted specimen  and confirmed on repeat testing.  While 2019 novel coronavirus  (SARS-CoV-2) nucleic acids may be present in the submitted sample  additional confirmatory testing may be necessary for epidemiological  and / or clinical management purposes  to differentiate between  SARS-CoV-2 and other Sarbecovirus currently known to infect humans.  If clinically indicated additional testing with an alternate test  methodology [phone]) is advised. The SARS-CoV-2 RNA is generally  detectable in upper and lower respiratory sp ecimens during the acute  phase of infection. The expected result is Negative. Fact Sheet for Patients:  StrictlyIdeas.no Fact Sheet for Healthcare Providers: BankingDealers.co.za This test is not yet approved or cleared by the [place] FDA and has been authorized for detection and/or diagnosis of SARS-CoV-2 by FDA under an Emergency Use Authorization (EUA).  This EUA will remain in effect (meaning this test can be used) for the duration of the COVID-19 declaration under Section 564(b)(1) of the Act, 21 U.S.C. section 360bbb-3(b)(1), unless the authorization is terminated or revoked sooner. Performed at [facility], [place] [address], [place], [place] [address]     Radiology Reports Ct Abdomen Pelvis Wo Contrast  Result Date: 05/30/2019 CLINICAL DATA:  Generalized abdominal pain, nausea EXAM: CT ABDOMEN AND PELVIS WITHOUT CONTRAST TECHNIQUE: Multidetector CT imaging of the abdomen and pelvis was performed following the standard protocol without IV contrast. COMPARISON:  04/20/2019 FINDINGS: Lower chest: No acute abnormality. Hepatobiliary: No focal liver abnormality is seen. No gallstones, gallbladder wall thickening, or biliary dilatation. Pancreas: Unremarkable. No pancreatic ductal dilatation or surrounding inflammatory changes.  Spleen: Scattered calcified granulomas within the spleen. No acute findings. Adrenals/Urinary Tract: Unremarkable adrenal glands. Bilateral renal cortical atrophy. Stable appearance of bilateral renal cysts. Extrarenal pelvis is noted bilaterally. No hydronephrosis. Ureters nondilated. Urinary bladder is moderately distended without acute abnormality. Stomach/Bowel: There is long segment wall thickening with mild surrounding fat stranding of the terminal ileum with area of caliber change in the right lower quadrant (series 2, image 49) and mild distension of the upstream ilium measuring up to 2.9 cm in diameter. Remaining small bowel is within normal limits. Scattered colonic diverticulosis. No pericolonic inflammatory changes. Small hiatal hernia. Vascular/Lymphatic: Atherosclerotic  calcification of the aortoiliac axis. No abdominal or pelvic lymphadenopathy identified. Reproductive: Uterus and bilateral adnexa are unremarkable. Other: No abdominal wall hernia or abnormality. No abdominopelvic ascites. No abscess. Musculoskeletal: Grade 1 anterolisthesis L4 on L5. No acute osseous findings. IMPRESSION: 1. Long segment wall thickening and surrounding mild inflammatory changes of the terminal ileum suggesting terminal ileitis, which can be seen in the setting of inflammatory bowel disease. 2. There is focal caliber change proximally with mild distension of the more proximal ileum raising the possibility of stricture. No evidence to suggest small bowel obstruction. Electronically Signed   By: Davina Poke M.D.   On: 05/30/2019 11:25   Dg Chest 2 View  Result Date: 05/16/2019 CLINICAL DATA:  Altered mental status, left-sided deficit beginning at 11 a.m. this morning. Left shoulder pain EXAM: CHEST - 2 VIEW COMPARISON:  Numerous priors, most recent March 19, 2019 FINDINGS: Streaky bandlike areas of atelectasis are present right mid lung and left lung base. Postsurgical changes are present the left lung. The lungs  are otherwise clear. No pneumothorax. No effusions. Stable appearance of the cardiomediastinal contours including rightward deviation of the trachea. Surgical clips project over the left axilla. No acute osseous or soft tissue abnormality. Postthoracotomy changes with prior left rib resection and remote rib fractures. Surgical clips project in the left axilla. IMPRESSION: Bilateral atelectasis. No acute cardiopulmonary disease. Electronically Signed   By: Lovena Le M.D.   On: 05/16/2019 16:12   Ct Head Wo Contrast  Result Date: 05/30/2019 CLINICAL DATA:  Altered level of consciousness EXAM: CT HEAD WITHOUT CONTRAST CT CERVICAL SPINE WITHOUT CONTRAST TECHNIQUE: Multidetector CT imaging of the head and cervical spine was performed following the standard protocol without intravenous contrast. Multiplanar CT image reconstructions of the cervical spine were also generated. COMPARISON:  05/24/2019 FINDINGS: CT HEAD FINDINGS Brain: No evidence of acute infarction, hemorrhage, extra-axial collection, ventriculomegaly, or mass effect. Generalized cerebral atrophy. Periventricular white matter low attenuation likely secondary to microangiopathy. Vascular: Cerebrovascular atherosclerotic calcifications are noted. Skull: Negative for fracture or focal lesion. Sinuses/Orbits: Visualized portions of the orbits are unremarkable. Visualized portions of the paranasal sinuses and mastoid air cells are unremarkable. Other: Scalp contusion along the left vertex. CT CERVICAL SPINE FINDINGS Alignment: Normal. Skull base and vertebrae: No acute fracture. No primary bone lesion or focal pathologic process. Erosion along the anterior aspect of the dens as can be seen with crystalline arthropathy. Soft tissues and spinal canal: No prevertebral fluid or swelling. No visible canal hematoma. Disc levels: Degenerative disc disease with disc height loss at C4-5 and C5-6. Right uncovertebral degenerative changes at C4-5 and C5-6 resulting  in foraminal stenosis. Mild left foraminal stenosis at C4-5. Upper chest: Left apical scarring. Other: Bilateral carotid artery atherosclerosis. IMPRESSION: 1. No acute intracranial pathology. 2.  No acute osseous injury of the cervical spine. Electronically Signed   By: Kathreen Devoid   On: 05/30/2019 07:33   Ct Head Wo Contrast  Result Date: 05/24/2019 CLINICAL DATA:  Worsening headaches. Recent falls. Nausea and emesis today. EXAM: CT HEAD WITHOUT CONTRAST TECHNIQUE: Contiguous axial images were obtained from the base of the skull through the vertex without intravenous contrast. COMPARISON:  Head CT 05/16/2019 FINDINGS: Brain: No intracranial hemorrhage, mass effect, or midline shift. Unchanged degree of atrophy and chronic small vessel ischemia. No hydrocephalus. The basilar cisterns are patent. No evidence of territorial infarct or acute ischemia. No extra-axial or intracranial fluid collection. Vascular: Atherosclerosis of skullbase vasculature without hyperdense vessel or abnormal calcification. Skull:  No fracture or focal lesion. Sinuses/Orbits: Minor mucosal thickening of ethmoid air cells. No acute findings. No sinus fluid level. Bilateral lens extraction. Other: None. IMPRESSION: 1. No acute intracranial abnormality. 2. Unchanged atrophy and chronic small vessel ischemia. Electronically Signed   By: Keith Rake M.D.   On: 05/24/2019 16:48   Ct Head Wo Contrast  Result Date: 05/16/2019 CLINICAL DATA:  TIA, multiple falls, hit back of head EXAM: CT HEAD WITHOUT CONTRAST TECHNIQUE: Contiguous axial images were obtained from the base of the skull through the vertex without intravenous contrast. COMPARISON:  CT brain 05/01/2019 FINDINGS: Brain: No acute territorial infarction, hemorrhage or intracranial mass. Atrophy and minimal small vessel ischemic change of the white matter. Stable ventricle size. Vascular: No hyperdense vessels. Vertebral and carotid vascular calcification Skull: Normal. Negative  for fracture or focal lesion. Sinuses/Orbits: No acute finding. Mild mucosal thickening in the maxillary and ethmoid sinuses Other: None IMPRESSION: 1. No CT evidence for acute intracranial abnormality. 2. Atrophy and minimal small vessel ischemic changes of the white matter Electronically Signed   By: Donavan Foil M.D.   On: 05/16/2019 17:49   Ct Cervical Spine Wo Contrast  Result Date: 05/30/2019 CLINICAL DATA:  Altered level of consciousness EXAM: CT HEAD WITHOUT CONTRAST CT CERVICAL SPINE WITHOUT CONTRAST TECHNIQUE: Multidetector CT imaging of the head and cervical spine was performed following the standard protocol without intravenous contrast. Multiplanar CT image reconstructions of the cervical spine were also generated. COMPARISON:  05/24/2019 FINDINGS: CT HEAD FINDINGS Brain: No evidence of acute infarction, hemorrhage, extra-axial collection, ventriculomegaly, or mass effect. Generalized cerebral atrophy. Periventricular white matter low attenuation likely secondary to microangiopathy. Vascular: Cerebrovascular atherosclerotic calcifications are noted. Skull: Negative for fracture or focal lesion. Sinuses/Orbits: Visualized portions of the orbits are unremarkable. Visualized portions of the paranasal sinuses and mastoid air cells are unremarkable. Other: Scalp contusion along the left vertex. CT CERVICAL SPINE FINDINGS Alignment: Normal. Skull base and vertebrae: No acute fracture. No primary bone lesion or focal pathologic process. Erosion along the anterior aspect of the dens as can be seen with crystalline arthropathy. Soft tissues and spinal canal: No prevertebral fluid or swelling. No visible canal hematoma. Disc levels: Degenerative disc disease with disc height loss at C4-5 and C5-6. Right uncovertebral degenerative changes at C4-5 and C5-6 resulting in foraminal stenosis. Mild left foraminal stenosis at C4-5. Upper chest: Left apical scarring. Other: Bilateral carotid artery atherosclerosis.  IMPRESSION: 1. No acute intracranial pathology. 2.  No acute osseous injury of the cervical spine. Electronically Signed   By: Kathreen Devoid   On: 05/30/2019 07:33   Dg Chest Portable 1 View  Result Date: 05/30/2019 CLINICAL DATA:  Altered mental status EXAM: PORTABLE CHEST 1 VIEW COMPARISON:  05/16/2019 FINDINGS: The heart size and mediastinal contours are within normal limits. Both lungs are clear. Old left posterior rib fractures. Surgical clips in the left axilla. IMPRESSION: No active disease. Electronically Signed   By: Kathreen Devoid   On: 05/30/2019 06:57   Dg Shoulder Left  Result Date: 05/16/2019 CLINICAL DATA:  Altered mental status, left-sided deficits, left shoulder pain EXAM: LEFT SHOULDER - 2+ VIEW COMPARISON:  None. FINDINGS: No evidence of fracture or traumatic malalignment. Degenerative changes of the acromioclavicular and lesser changes at the glenohumeral joint. Coracoclavicular and acromioclavicular intervals are maintained. Postsurgical changes are noted in the left axilla as well as postthoracotomy changes prior rib resection and several remote deformities of the left-sided ribs as well as suture material within the left lung.  The aorta is calcified. IMPRESSION: 1. No acute osseous abnormality. 2. Mild acromioclavicular and glenohumeral degenerative changes. 3. Post thoracotomy changes left chest wall. Postsurgical changes left axilla. Electronically Signed   By: Lovena Le M.D.   On: 05/16/2019 16:13    Time Spent in minutes 25   Olson Lucarelli M.D on 06/02/2019 at 2:01 PM  Between 7am to 7pm - Pager - [phone]  After 7pm go to [email or web address] - password [facility]  [facility] -  Office  [phone]

## 2019-06-02 NOTE — Progress Notes (Signed)
PT Note  Patient Details Name: Samantha Mckenzie MRN: [id number] DOB: [birth year]      Eval assessment complete, full write up to follow. Pt ambulated in room, turning using RW for about 50 feet. Tolerated well, still some slight weakness and fatigue with ambulation and cues for steering ( not used to using RW at this time). Could still benefit from ST-SNF  ( pt states agreeable to stay this time for rehab) to address overall strength for short term stay prior to DC home due to frequent hospitalizations, weakness and recent fall.   Clide Dales 06/02/2019, 11:56 AM  Clide Dales, PT Acute Rehabilitation Services Pager: [phone] Office: [phone] 06/02/2019

## 2019-06-02 NOTE — Progress Notes (Signed)
[facility]OSS [facility] Subjective: [name] is a 83 year old female who was admitted to the hospital with abdominal pain nausea vomiting and diarrhea along with blood in the stool and poor oral intake.  CT of the abdomen without contrast revealed a long segment of wall thickening and surrounding inflammatory changes in the terminal ileum suggestive of terminal ileitis ?IBD.  There was a focal caliber change proximally mild distention of the more proximal ileum raising question of a possible stricture but there was no evidence of bowel obstruction.. Patient seems somewhat confused and cannot tell me the exact reason for hospitalization.  She claims she has had some abdominal discomfort but denies any abdominal pain nausea vomiting or diarrhea at this time.  She had 1 soft bowel movement this morning without any evidence of melena or hematochezia.  A TB QuantiFERON Ferron has been ordered before she can be tried on steroids empirically  Objective: Vital signs in last 24 hours: Temp:  [98.3 F (36.8 C)-98.9 F (37.2 C)] 98.7 F (37.1 C) (08/22 0543) Pulse Rate:  [68-77] 71 (08/22 0543) Resp:  [16-20] 16 (08/22 0543) BP: (133-151)/(58-72) 133/62 (08/22 0543) SpO2:  [99 %-100 %] 100 % (08/22 0543) Weight:  [53.3 kg-54 kg] 53.3 kg (08/22 0543) Last BM Date: 06/01/19  Intake/Output from previous day: 08/21 0701 - 08/22 0700 In: 705 [P.O.:355; IV Piggyback:350] Out: 800 [Urine:800] Intake/Output this shift: No intake/output data recorded.  General appearance: cooperative, appears stated age, fatigued, no distress and pale Resp: clear to auscultation bilaterally Cardio: regular rate and rhythm, S1, S2 normal, no murmur, click, rub or gallop GI: soft, non-tender; bowel sounds normal; no masses,  no organomegaly Extremities: extremities normal, atraumatic, no cyanosis or edema  Lab Results: Recent Labs    05/31/19 0717 05/31/19 2246 06/01/19 0502 06/02/19 0530  WBC 6.1  --  5.8 5.5   HGB 6.8* 10.8* 9.3* 9.7*  HCT 21.4* 33.3* 29.1* 30.0*  PLT 366  --  334 333   BMET Recent Labs    05/31/19 0717 06/01/19 0502 06/02/19 0530  NA 139 142 144  K 2.9* 4.3 3.9  CL 97* 107 104  CO2 32 27 28  GLUCOSE 90 87 86  BUN 27* 19 19  CREATININE 2.14* 2.15* 2.53*  CALCIUM 7.0* 7.7* 8.2*   LFT Recent Labs    05/31/19 0717  PROT 5.6*  ALBUMIN 2.1*  AST 13*  ALT 11  ALKPHOS 62  BILITOT 0.5   Studies/Results: No results found.  Medications: I have reviewed the patient's current medications.  Assessment/Plan: 1) Terminal ileitis on CT scan with mild proximal dilation of distal small bowel-TB QuantiFERON is pending.  If this is negative patient will be given a trial of steroids; both the patient and the family not interested in pursuing any invasive procedures at this time as the patient is a DNR. 2) History of GERD. 3) type 2 diabetes mellitus with peripheral neuropathy. 4) COPD. 5) Chronic diastolic CHF. 6) History of breast cancer status post less left mastectomy.     LOS: 3 days   Juanita Craver 06/02/2019, 7:56 AM

## 2019-06-03 LAB — CBC
HCT: 36.5 % (ref 36.0–46.0)
Hemoglobin: 11.4 g/dL — ABNORMAL LOW (ref 12.0–15.0)
MCH: 30.3 pg (ref 26.0–34.0)
MCHC: 31.2 g/dL (ref 30.0–36.0)
MCV: 97.1 fL (ref 80.0–100.0)
Platelets: 382 10*3/uL (ref 150–400)
RBC: 3.76 MIL/uL — ABNORMAL LOW (ref 3.87–5.11)
RDW: 20 % — ABNORMAL HIGH (ref 11.5–15.5)
WBC: 4.9 10*3/uL (ref 4.0–10.5)
nRBC: 0 % (ref 0.0–0.2)

## 2019-06-03 LAB — C DIFFICILE QUICK SCREEN W PCR REFLEX
C Diff antigen: NEGATIVE
C Diff interpretation: NOT DETECTED
C Diff toxin: NEGATIVE

## 2019-06-03 LAB — GLUCOSE, CAPILLARY
Glucose-Capillary: 85 mg/dL (ref 70–99)
Glucose-Capillary: 94 mg/dL (ref 70–99)
Glucose-Capillary: 133 mg/dL — ABNORMAL HIGH (ref 70–99)
Glucose-Capillary: 123 mg/dL — ABNORMAL HIGH (ref 70–99)

## 2019-06-03 NOTE — Progress Notes (Signed)
PROGRESS NOTE                                                                                                                                                                                                             Patient Demographics:    Samantha Mckenzie, is a 83 y.o. female, DOB - 01/02/1936, OZY:[id number]  Admit date - 05/30/2019   Admitting Physician Kayleen Memos, DO  Outpatient Primary MD for the patient is Colon Branch, MD  LOS - 4  Outpatient Specialists:  Chief Complaint  Patient presents with   Nausea       Brief Narrative   83 year old female with history of GERD, prior history of focal enteritis versus?  IBD, type 2 diabetes mellitus with peripheral neuropathy, COPD, chronic diastolic CHF, breast cancer status post left mastectomy was brought to the ED from home with diffuse abdominal pain, nausea, vomiting and diarrhea.  Also reported frank blood in stool, poor p.o. intake. In the ED she was found to be in early sepsis with tachycardia, tachypnea, hypertensive, AKI and hypokalemia.  Also had elevated lactic acid of 2.9.  CT abdomen pelvis without contrast showed findings of terminal ileitis.  Admitted to hospital service on empiric antibiotics.   Subjective:   Loose BM x 1 mixed with rine so no stool sample sent. Denies abd pain   Assessment  & Plan :   Sepsis with terminal ileitis, infectious versus inflammatory. Sepsis resolved.  Switch abx to augmentin starting tomorrow Tolerating advance diet. GI following and recommends less likely IBD versus gastrointestinal TB versus lymphoma.  ESR is elevated at 55.   TB QuantiFERON pending.  If negative GI plan on starting steroid. Family informed GI that they were not interested in pursuing any invasive procedure.  Made DNR.     Acute blood loss anemia Has baseline normocytic anemia with hemoglobin around 8.5.  Dropped to 6.8 this admission.  Improved  with 1 unit PRBC.  Acute on chronic kidney disease stage III Possibly prerenal with dehydration.  Baseline creatinine around 2, worsened to 2.57 on presentation.  Improved with IV fluids  Hypokalemia Replenished.  Magnesium normal.  Failure to thrive and poor p.o. intake PT and nutrition consult. Recommend SNF  Type 2 diabetes mellitus, controlled A1c of 5.2.  Monitor on sliding scale coverage   ?  Depression  and frontotemporal dementia. PCP had concern brought up by family that patient was not adherent to treatment regimen.  She tells me that she was having frequent dizzy spells and and headache and feeling depressed for past several weeks.  Reported feeling worthless but denied any suicidal ideation.  She is on Lexapro and dose was recently increased by her PCP.   Psych consult appreciated. Agrees with increased dose of lexapro. QTc normal.   Psych recommends patient does not have capacity to make complex medical decisions for herself.  Family (husband and son) informed about this and are involved in her care planning.  Son told me that husband is the healthcare proxy.    Essential hypertension Continue amlodipine  Dyslipidemia Continue statin  Generalized weakness. Seen by PT and recommend short-term rehab.  Code Status : DNR  Family Communication  : Updated husband and son on the phone  Disposition Plan  : SNF possibly tomorrow  Barriers For Discharge : Active symptoms Consults  : GI  Procedures  : CT abdomen  DVT Prophylaxis  : SCDs  Lab Results  Component Value Date   PLT 382 06/03/2019    Antibiotics  :    Anti-infectives (From admission, onward)   Start     Dose/Rate Route Frequency Ordered Stop   05/30/19 1400  piperacillin-tazobactam (ZOSYN) IVPB 2.25 g     2.25 g 100 mL/hr over 30 Minutes Intravenous Every 8 hours 05/30/19 1343          Objective:   Vitals:   06/02/19 1608 06/02/19 2035 06/03/19 0500 06/03/19 0534  BP: (!) 164/58 (!) 116/56   (!) 174/72  Pulse: 73 75  75  Resp: 16 16  16   Temp: 99.1 F (37.3 C) 98.3 F (36.8 C)  98.3 F (36.8 C)  TempSrc: Oral Oral  Oral  SpO2: 100% 98%  100%  Weight:   52.8 kg   Height:        Wt Readings from Last 3 Encounters:  06/03/19 52.8 kg  05/24/19 59 kg  05/16/19 59 kg     Intake/Output Summary (Last 24 hours) at 06/03/2019 1324 Last data filed at 06/03/2019 1200 Gross per 24 hour  Intake 50 ml  Output --  Net 50 ml    Physical exam  NAD  HEENT: Moist mucosa, supple neck  chest: clear  CVS: NS1&S2 GI: soft, NT, ND Musculoskeletal: warm        Data Review:    CBC Recent Labs  Lab 05/30/19 0616  05/31/19 0717 05/31/19 2246 06/01/19 0502 06/02/19 0530 06/03/19 0844  WBC 10.3  --  6.1  --  5.8 5.5 4.9  HGB 8.8*   < > 6.8* 10.8* 9.3* 9.7* 11.4*  HCT 26.2*   < > 21.4* 33.3* 29.1* 30.0* 36.5  PLT 483*  --  366  --  334 333 382  MCV 92.9  --  97.3  --  94.8 95.5 97.1  MCH 31.2  --  30.9  --  30.3 30.9 30.3  MCHC 33.6  --  31.8  --  32.0 32.3 31.2  RDW 21.0*  --  21.7*  --  21.5* 20.8* 20.0*  LYMPHSABS 0.7  --  1.6  --   --   --   --   MONOABS 0.5  --  0.5  --   --   --   --   EOSABS 0.0  --  0.1  --   --   --   --  BASOSABS 0.0  --  0.0  --   --   --   --    < > = values in this interval not displayed.    Chemistries  Recent Labs  Lab 05/30/19 0616 05/30/19 0654 05/31/19 0717 06/01/19 0502 06/02/19 0530  NA 134* 135 139 142 144  K 3.0* 3.0* 2.9* 4.3 3.9  CL 98 99 97* 107 104  CO2 18*  --  32 27 28  GLUCOSE 171* 168* 90 87 86  BUN 34* 27* 27* 19 19  CREATININE 2.57* 2.40* 2.14* 2.15* 2.53*  CALCIUM 7.8*  --  7.0* 7.7* 8.2*  MG  --   --  1.9  --   --   AST 20  --  13*  --   --   ALT 15  --  11  --   --   ALKPHOS 76  --  62  --   --   BILITOT 1.1  --  0.5  --   --    ------------------------------------------------------------------------------------------------------------------ No results for input(s): CHOL, HDL, LDLCALC, TRIG,  CHOLHDL, LDLDIRECT in the last 72 hours.  Lab Results  Component Value Date   HGBA1C 5.2 05/30/2019   ------------------------------------------------------------------------------------------------------------------ No results for input(s): TSH, T4TOTAL, T3FREE, THYROIDAB in the last 72 hours.  Invalid input(s): FREET3 ------------------------------------------------------------------------------------------------------------------ No results for input(s): VITAMINB12, FOLATE, FERRITIN, TIBC, IRON, RETICCTPCT in the last 72 hours.  Coagulation profile Recent Labs  Lab 05/30/19 0616  INR 1.0    No results for input(s): DDIMER in the last 72 hours.  Cardiac Enzymes No results for input(s): CKMB, TROPONINI, MYOGLOBIN in the last 168 hours.  Invalid input(s): CK ------------------------------------------------------------------------------------------------------------------    Component Value Date/Time   BNP 294.1 (H) 04/07/2019 2157    Inpatient Medications  Scheduled Meds:  amLODipine  10 mg Oral Daily   atorvastatin  20 mg Oral Daily   cholecalciferol  1,000 Units Oral Daily   dicyclomine  20 mg Oral TID AC & HS   escitalopram  20 mg Oral Daily   ferrous sulfate  325 mg Oral Q breakfast   insulin aspart  0-5 Units Subcutaneous QHS   insulin aspart  0-9 Units Subcutaneous TID WC   mirtazapine  15 mg Oral QHS   pantoprazole  40 mg Oral Daily   vitamin B-12  1,000 mcg Oral Daily   Continuous Infusions:  piperacillin-tazobactam (ZOSYN)  IV 2.25 g (06/03/19 0534)   PRN Meds:.hydrOXYzine, loperamide, polyethylene glycol  Micro Results Recent Results (from the past 240 hour(s))  Blood culture (routine x 2)     Status: None (Preliminary result)   Collection Time: 05/30/19  6:16 AM   Specimen: BLOOD  Result Value Ref Range Status   Specimen Description   Final    BLOOD BLOOD RIGHT FOREARM Performed at [facility], [place] [address], [place], [place] [address]    Special Requests   Final    BOTTLES DRAWN AEROBIC ONLY Blood Culture adequate volume Performed at [place] [address] [place], [place] [address]    Culture   Final    NO GROWTH 4 DAYS Performed at [facility], [place] [address] [place], [place] [address]    Report Status PENDING  Incomplete  Blood culture (routine x 2)     Status: None (Preliminary result)   Collection Time: 05/30/19  6:44 AM   Specimen: BLOOD  Result Value Ref Range Status   Specimen Description   Final  BLOOD LEFT ANTECUBITAL Performed at [place] [address] [place], [place] [address]    Special Requests   Final    BOTTLES DRAWN AEROBIC AND ANAEROBIC Blood Culture results may not be optimal due to an excessive volume of blood received in culture bottles Performed at [place] [address], [place], [place] [address]    Culture   Final    NO GROWTH 4 DAYS Performed at [facility], [place] [address], [place], [place] [address]    Report Status PENDING  Incomplete  SARS Coronavirus 2 [facility] order, Performed in [facility] hospital lab) Nasopharyngeal Nasopharyngeal Swab     Status: None   Collection Time: 05/30/19 12:16 PM   Specimen: Nasopharyngeal Swab  Result Value Ref Range Status   SARS Coronavirus 2 NEGATIVE NEGATIVE Final    Comment: (NOTE) If result is NEGATIVE SARS-CoV-2 target nucleic acids are NOT DETECTED. The SARS-CoV-2 RNA is generally detectable in upper and lower  respiratory specimens during the acute phase of infection. The lowest  concentration of SARS-CoV-2 viral copies this assay can detect is 250  copies / mL. A negative result does not preclude SARS-CoV-2 infection  and should not be used as the sole basis for treatment or other  patient management decisions.  A negative result may occur with  improper specimen collection / handling, submission of specimen other   than nasopharyngeal swab, presence of viral mutation(s) within the  areas targeted by this assay, and inadequate number of viral copies  (<250 copies / mL). A negative result must be combined with clinical  observations, patient history, and epidemiological information. If result is POSITIVE SARS-CoV-2 target nucleic acids are DETECTED. The SARS-CoV-2 RNA is generally detectable in upper and lower  respiratory specimens dur ing the acute phase of infection.  Positive  results are indicative of active infection with SARS-CoV-2.  Clinical  correlation with patient history and other diagnostic information is  necessary to determine patient infection status.  Positive results do  not rule out bacterial infection or co-infection with other viruses. If result is PRESUMPTIVE POSTIVE SARS-CoV-2 nucleic acids MAY BE PRESENT.   A presumptive positive result was obtained on the submitted specimen  and confirmed on repeat testing.  While 2019 novel coronavirus  (SARS-CoV-2) nucleic acids may be present in the submitted sample  additional confirmatory testing may be necessary for epidemiological  and / or clinical management purposes  to differentiate between  SARS-CoV-2 and other Sarbecovirus currently known to infect humans.  If clinically indicated additional testing with an alternate test  methodology [phone]) is advised. The SARS-CoV-2 RNA is generally  detectable in upper and lower respiratory sp ecimens during the acute  phase of infection. The expected result is Negative. Fact Sheet for Patients:  StrictlyIdeas.no Fact Sheet for Healthcare Providers: BankingDealers.co.za This test is not yet approved or cleared by the [place] FDA and has been authorized for detection and/or diagnosis of SARS-CoV-2 by FDA under an Emergency Use Authorization (EUA).  This EUA will remain in effect (meaning this test can be used) for the duration of  the COVID-19 declaration under Section 564(b)(1) of the Act, 21 U.S.C. section 360bbb-3(b)(1), unless the authorization is terminated or revoked sooner. Performed at [facility], [place] [address], [place], [place] [address]     Radiology Reports Ct Abdomen Pelvis Wo Contrast  Result Date: 05/30/2019 CLINICAL DATA:  Generalized abdominal pain, nausea EXAM: CT ABDOMEN AND PELVIS WITHOUT CONTRAST TECHNIQUE:  Multidetector CT imaging of the abdomen and pelvis was performed following the standard protocol without IV contrast. COMPARISON:  04/20/2019 FINDINGS: Lower chest: No acute abnormality. Hepatobiliary: No focal liver abnormality is seen. No gallstones, gallbladder wall thickening, or biliary dilatation. Pancreas: Unremarkable. No pancreatic ductal dilatation or surrounding inflammatory changes. Spleen: Scattered calcified granulomas within the spleen. No acute findings. Adrenals/Urinary Tract: Unremarkable adrenal glands. Bilateral renal cortical atrophy. Stable appearance of bilateral renal cysts. Extrarenal pelvis is noted bilaterally. No hydronephrosis. Ureters nondilated. Urinary bladder is moderately distended without acute abnormality. Stomach/Bowel: There is long segment wall thickening with mild surrounding fat stranding of the terminal ileum with area of caliber change in the right lower quadrant (series 2, image 49) and mild distension of the upstream ilium measuring up to 2.9 cm in diameter. Remaining small bowel is within normal limits. Scattered colonic diverticulosis. No pericolonic inflammatory changes. Small hiatal hernia. Vascular/Lymphatic: Atherosclerotic calcification of the aortoiliac axis. No abdominal or pelvic lymphadenopathy identified. Reproductive: Uterus and bilateral adnexa are unremarkable. Other: No abdominal wall hernia or abnormality. No abdominopelvic ascites. No abscess. Musculoskeletal: Grade 1 anterolisthesis L4 on L5. No acute osseous findings.  IMPRESSION: 1. Long segment wall thickening and surrounding mild inflammatory changes of the terminal ileum suggesting terminal ileitis, which can be seen in the setting of inflammatory bowel disease. 2. There is focal caliber change proximally with mild distension of the more proximal ileum raising the possibility of stricture. No evidence to suggest small bowel obstruction. Electronically Signed   By: Davina Poke M.D.   On: 05/30/2019 11:25   Dg Chest 2 View  Result Date: 05/16/2019 CLINICAL DATA:  Altered mental status, left-sided deficit beginning at 11 a.m. this morning. Left shoulder pain EXAM: CHEST - 2 VIEW COMPARISON:  Numerous priors, most recent March 19, 2019 FINDINGS: Streaky bandlike areas of atelectasis are present right mid lung and left lung base. Postsurgical changes are present the left lung. The lungs are otherwise clear. No pneumothorax. No effusions. Stable appearance of the cardiomediastinal contours including rightward deviation of the trachea. Surgical clips project over the left axilla. No acute osseous or soft tissue abnormality. Postthoracotomy changes with prior left rib resection and remote rib fractures. Surgical clips project in the left axilla. IMPRESSION: Bilateral atelectasis. No acute cardiopulmonary disease. Electronically Signed   By: Lovena Le M.D.   On: 05/16/2019 16:12   Ct Head Wo Contrast  Result Date: 05/30/2019 CLINICAL DATA:  Altered level of consciousness EXAM: CT HEAD WITHOUT CONTRAST CT CERVICAL SPINE WITHOUT CONTRAST TECHNIQUE: Multidetector CT imaging of the head and cervical spine was performed following the standard protocol without intravenous contrast. Multiplanar CT image reconstructions of the cervical spine were also generated. COMPARISON:  05/24/2019 FINDINGS: CT HEAD FINDINGS Brain: No evidence of acute infarction, hemorrhage, extra-axial collection, ventriculomegaly, or mass effect. Generalized cerebral atrophy. Periventricular white matter  low attenuation likely secondary to microangiopathy. Vascular: Cerebrovascular atherosclerotic calcifications are noted. Skull: Negative for fracture or focal lesion. Sinuses/Orbits: Visualized portions of the orbits are unremarkable. Visualized portions of the paranasal sinuses and mastoid air cells are unremarkable. Other: Scalp contusion along the left vertex. CT CERVICAL SPINE FINDINGS Alignment: Normal. Skull base and vertebrae: No acute fracture. No primary bone lesion or focal pathologic process. Erosion along the anterior aspect of the dens as can be seen with crystalline arthropathy. Soft tissues and spinal canal: No prevertebral fluid or swelling. No visible canal hematoma. Disc levels: Degenerative disc disease with disc height loss at C4-5 and C5-6. Right uncovertebral  degenerative changes at C4-5 and C5-6 resulting in foraminal stenosis. Mild left foraminal stenosis at C4-5. Upper chest: Left apical scarring. Other: Bilateral carotid artery atherosclerosis. IMPRESSION: 1. No acute intracranial pathology. 2.  No acute osseous injury of the cervical spine. Electronically Signed   By: Kathreen Devoid   On: 05/30/2019 07:33   Ct Head Wo Contrast  Result Date: 05/24/2019 CLINICAL DATA:  Worsening headaches. Recent falls. Nausea and emesis today. EXAM: CT HEAD WITHOUT CONTRAST TECHNIQUE: Contiguous axial images were obtained from the base of the skull through the vertex without intravenous contrast. COMPARISON:  Head CT 05/16/2019 FINDINGS: Brain: No intracranial hemorrhage, mass effect, or midline shift. Unchanged degree of atrophy and chronic small vessel ischemia. No hydrocephalus. The basilar cisterns are patent. No evidence of territorial infarct or acute ischemia. No extra-axial or intracranial fluid collection. Vascular: Atherosclerosis of skullbase vasculature without hyperdense vessel or abnormal calcification. Skull: No fracture or focal lesion. Sinuses/Orbits: Minor mucosal thickening of ethmoid  air cells. No acute findings. No sinus fluid level. Bilateral lens extraction. Other: None. IMPRESSION: 1. No acute intracranial abnormality. 2. Unchanged atrophy and chronic small vessel ischemia. Electronically Signed   By: Keith Rake M.D.   On: 05/24/2019 16:48   Ct Head Wo Contrast  Result Date: 05/16/2019 CLINICAL DATA:  TIA, multiple falls, hit back of head EXAM: CT HEAD WITHOUT CONTRAST TECHNIQUE: Contiguous axial images were obtained from the base of the skull through the vertex without intravenous contrast. COMPARISON:  CT brain 05/01/2019 FINDINGS: Brain: No acute territorial infarction, hemorrhage or intracranial mass. Atrophy and minimal small vessel ischemic change of the white matter. Stable ventricle size. Vascular: No hyperdense vessels. Vertebral and carotid vascular calcification Skull: Normal. Negative for fracture or focal lesion. Sinuses/Orbits: No acute finding. Mild mucosal thickening in the maxillary and ethmoid sinuses Other: None IMPRESSION: 1. No CT evidence for acute intracranial abnormality. 2. Atrophy and minimal small vessel ischemic changes of the white matter Electronically Signed   By: Donavan Foil M.D.   On: 05/16/2019 17:49   Ct Cervical Spine Wo Contrast  Result Date: 05/30/2019 CLINICAL DATA:  Altered level of consciousness EXAM: CT HEAD WITHOUT CONTRAST CT CERVICAL SPINE WITHOUT CONTRAST TECHNIQUE: Multidetector CT imaging of the head and cervical spine was performed following the standard protocol without intravenous contrast. Multiplanar CT image reconstructions of the cervical spine were also generated. COMPARISON:  05/24/2019 FINDINGS: CT HEAD FINDINGS Brain: No evidence of acute infarction, hemorrhage, extra-axial collection, ventriculomegaly, or mass effect. Generalized cerebral atrophy. Periventricular white matter low attenuation likely secondary to microangiopathy. Vascular: Cerebrovascular atherosclerotic calcifications are noted. Skull: Negative for  fracture or focal lesion. Sinuses/Orbits: Visualized portions of the orbits are unremarkable. Visualized portions of the paranasal sinuses and mastoid air cells are unremarkable. Other: Scalp contusion along the left vertex. CT CERVICAL SPINE FINDINGS Alignment: Normal. Skull base and vertebrae: No acute fracture. No primary bone lesion or focal pathologic process. Erosion along the anterior aspect of the dens as can be seen with crystalline arthropathy. Soft tissues and spinal canal: No prevertebral fluid or swelling. No visible canal hematoma. Disc levels: Degenerative disc disease with disc height loss at C4-5 and C5-6. Right uncovertebral degenerative changes at C4-5 and C5-6 resulting in foraminal stenosis. Mild left foraminal stenosis at C4-5. Upper chest: Left apical scarring. Other: Bilateral carotid artery atherosclerosis. IMPRESSION: 1. No acute intracranial pathology. 2.  No acute osseous injury of the cervical spine. Electronically Signed   By: Kathreen Devoid   On: 05/30/2019 07:33  Dg Chest Portable 1 View  Result Date: 05/30/2019 CLINICAL DATA:  Altered mental status EXAM: PORTABLE CHEST 1 VIEW COMPARISON:  05/16/2019 FINDINGS: The heart size and mediastinal contours are within normal limits. Both lungs are clear. Old left posterior rib fractures. Surgical clips in the left axilla. IMPRESSION: No active disease. Electronically Signed   By: Kathreen Devoid   On: 05/30/2019 06:57   Dg Shoulder Left  Result Date: 05/16/2019 CLINICAL DATA:  Altered mental status, left-sided deficits, left shoulder pain EXAM: LEFT SHOULDER - 2+ VIEW COMPARISON:  None. FINDINGS: No evidence of fracture or traumatic malalignment. Degenerative changes of the acromioclavicular and lesser changes at the glenohumeral joint. Coracoclavicular and acromioclavicular intervals are maintained. Postsurgical changes are noted in the left axilla as well as postthoracotomy changes prior rib resection and several remote deformities of  the left-sided ribs as well as suture material within the left lung. The aorta is calcified. IMPRESSION: 1. No acute osseous abnormality. 2. Mild acromioclavicular and glenohumeral degenerative changes. 3. Post thoracotomy changes left chest wall. Postsurgical changes left axilla. Electronically Signed   By: Lovena Le M.D.   On: 05/16/2019 16:13    Time Spent in minutes 25   Natale Thoma M.D on 06/03/2019 at 1:24 PM  Between 7am to 7pm - Pager - [phone]  After 7pm go to [email or web address] - password [facility]  [facility] -  Office  [phone]

## 2019-06-03 NOTE — Progress Notes (Signed)
Cross cover [facility] Subjective: Since I last evaluated the patient.  There has not been much overall change in the patient's condition.  She has not had a bowel movement this morning but has had 2 or 3 loose stools yesterday she says she does not have much of an appetite and seems somewhat depressed.  She says she has no desire to eat.  We are awaiting the results of her TB QuantiFERON before steroids can be tried  Objective: Vital signs in last 24 hours: Temp:  [98.3 F (36.8 C)-99.1 F (37.3 C)] 98.3 F (36.8 C) (08/23 0534) Pulse Rate:  [73-75] 75 (08/23 0534) Resp:  [16] 16 (08/23 0534) BP: (116-174)/(56-72) 174/72 (08/23 0534) SpO2:  [98 %-100 %] 100 % (08/23 0534) Weight:  [52.8 kg] 52.8 kg (08/23 0500) Last BM Date: 06/02/19  Intake/Output from previous day: No intake/output data recorded. Intake/Output this shift: No intake/output data recorded.  General appearance: alert, fatigued and no distress Resp: clear to auscultation bilaterally Cardio: regular rate and rhythm, S1, S2 normal, no murmur, click, rub or gallop GI: soft, non-tender; bowel sounds normal; no masses,  no organomegaly Extremities: extremities normal, atraumatic, no cyanosis or edema  Lab Results: Recent Labs    05/31/19 2246 06/01/19 0502 06/02/19 0530  WBC  --  5.8 5.5  HGB 10.8* 9.3* 9.7*  HCT 33.3* 29.1* 30.0*  PLT  --  334 333   BMET Recent Labs    06/01/19 0502 06/02/19 0530  NA 142 144  K 4.3 3.9  CL 107 104  CO2 27 28  GLUCOSE 87 86  BUN 19 19  CREATININE 2.15* 2.53*  CALCIUM 7.7* 8.2*   Medications: I have reviewed the patient's current medications.  Assessment/Plan: 1) Terminal ileitis on CT scan with mild proximal dilation of distal small bowel-TB QuantiFERON is pending.  If this is negative patient will be given a trial of steroids; both the patient and the family not interested in pursuing any invasive procedures at this time as the patient is a DNR. 2) History of GERD. 3)  Anemia of chronic disease-monitor CBC closely. 4) Type 2 diabetes mellitus with peripheral neuropathy. 5) COPD. 6) Chronic diastolic CHF. 7) History of breast cancer status post less left mastectomy.    LOS: 4 days   Juanita Craver 06/03/2019, 8:36 AM

## 2019-06-03 NOTE — TOC Progression Note (Addendum)
Transition of Care [facility]) - Progression Note    Patient Details  Name: Samantha Mckenzie MRN: [id number] Date of Birth: [birth year]  [facility]) CM/SW Contact  Ross Ludwig, [place] Phone Number: 06/03/2019, 11:04 AM  Clinical Narrative:     CSW spoke with patient's son [name] [phone] and discussed SNF placement for patient.  CSW explained roll of CSW and process for finding placement.  CSW was given permission to begin bed search in [facility].  Patient son states that she has been to SNF before and signed herself out.  Psych saw patient and she was deemed unable to make her own decisions.  Patient's son stated they would prefer [facility] if possible due to proximity of where husband lives.  CSW informed patient's son that they are on the list, but CSW will have to see if they have any beds available.  CSW attempted to contact [facility] admissions worker, however she was unavailable, CSW left a message awaiting for call back.  CSW to continue to follow patient's progress throughout discharge planning.      Expected Discharge Plan and Services    Discharge to SNF per family's request and PT recommendations.       Expected Discharge Date: (unknown)                                     Social Determinants of Health (SDOH) Interventions    Readmission Risk Interventions Readmission Risk Prevention Plan 05/31/2019 04/10/2019 03/26/2019  Transportation Screening Complete Complete Complete  PCP or Specialist Appt within 3-5 Days - - Complete  HRI or [place] - - Complete  Social Work Consult for [place] Planning/Counseling - - Complete  Palliative Care Screening - - Not Applicable  Medication Review (RN Care Manager) Referral to Pharmacy Referral to Pharmacy Complete  PCP or Specialist appointment within 3-5 days of discharge Complete Complete -  [place] or Home Care Consult Complete Complete -  SW Recovery Care/Counseling Consult Complete  Complete -  Palliative Care Screening Not Applicable Not Applicable -  [place] Not Applicable Not Applicable -  Some recent data might be hidden

## 2019-06-03 NOTE — NC FL2 (Signed)
MEDICAID FL2 LEVEL OF CARE SCREENING TOOL     IDENTIFICATION  Patient Name: Samantha Mckenzie Birthdate: [birth year] Sex: female Admission Date (Current Location): 05/30/2019  [facility] and [place] Number:  Herbalist and Address:  [facility],  [place] [address] [place]      Provider Number: [id number]  Attending Physician Name and Address:  Louellen Molder, MD  Relative Name and Phone Number:  Angelynn, Lemus   [phone]    Current Level of Care: Hospital Recommended Level of Care: [place] Prior Approval Number:    Date Approved/Denied:   PASRR Number: [id number]  Discharge Plan: SNF    Current Diagnoses: Patient Active Problem List   Diagnosis Date Noted  . Dehydration 05/31/2019  . Acute blood loss anemia   . Ileitis 05/30/2019  . Terminal ileitis with complication ([place]) 41/96/2229  . COPD (chronic obstructive pulmonary disease) ([place]) 04/12/2019  . Enteritis 04/07/2019  . Chronic diastolic CHF (congestive heart failure) ([place]) 04/07/2019  . Hypokalemia 04/07/2019  . Hypocalcemia 04/07/2019  . Fall 04/07/2019  . Occult blood positive stool 04/07/2019  . Adjustment disorder with depressed mood   . Generalized abdominal pain   . Dysphasia 03/21/2019  . CAP (community acquired pneumonia) 03/20/2019  . Nausea vomiting and diarrhea 03/20/2019  . Acute respiratory failure with hypoxia ([place]) 03/20/2019  . Acute renal failure superimposed on stage 4 chronic kidney disease ([place]) 03/20/2019  . Lactic acidosis 03/20/2019  . Diabetic peripheral neuropathy associated with type 2 diabetes mellitus ([place]) 03/20/2019  . Hypomagnesemia 06/21/2018  . Generalized weakness 06/20/2018  . PCP NOTES >>> 07/11/2015  . Depression   . VITAMIN B12 DEFICIENCY 04/01/2008  . GERD 12/19/2007  . NEOP, MALIGNANT, FEMALE BREAST NOS 07/07/2007  . Osteoarthritis 07/07/2007  . Diabetes mellitus type 2 with complications ([place])  79/89/2119  . Hyperlipidemia 02/28/2007  . GOUT 02/28/2007  . Normocytic anemia 02/28/2007  . Essential hypertension 02/28/2007    Orientation RESPIRATION BLADDER Height & Weight     Time, Situation, Place, Self  Normal Continent Weight: 116 lb 6.5 oz (52.8 kg) Height:  5' 2.99" (160 cm)  BEHAVIORAL SYMPTOMS/MOOD NEUROLOGICAL BOWEL NUTRITION STATUS      Continent Diet(Regular diet)  AMBULATORY STATUS COMMUNICATION OF NEEDS Skin   Limited Assist Verbally Normal                       Personal Care Assistance Level of Assistance  Bathing, Feeding, Dressing Bathing Assistance: Limited assistance Feeding assistance: Independent Dressing Assistance: Limited assistance     Functional Limitations Info  Sight, Hearing, Speech Sight Info: Adequate Hearing Info: Adequate Speech Info: Adequate    SPECIAL CARE FACTORS FREQUENCY  PT (By licensed PT), OT (By licensed OT)     PT Frequency: Minimum 5x a week OT Frequency: Minimum 5x a week            Contractures Contractures Info: Not present    Additional Factors Info  Code Status, Allergies Code Status Info: DNR Allergies Info: Codeine Morphine Oxycodone-acetaminophen Sulfonamide Derivatives Tramadol Hcl           Current Medications (06/03/2019):  This is the current hospital active medication list Current Facility-Administered Medications  Medication Dose Route Frequency Provider Last Rate Last Dose  . amLODipine (NORVASC) tablet 10 mg  10 mg Oral Daily Irene Pap N, DO   10 mg at 06/03/19 1001  . atorvastatin (LIPITOR) tablet 20 mg  20 mg  Oral Daily Irene Pap N, DO   20 mg at 06/03/19 1001  . cholecalciferol (VITAMIN D) tablet 1,000 Units  1,000 Units Oral Daily Irene Pap N, DO   1,000 Units at 06/03/19 1001  . dicyclomine (BENTYL) capsule 20 mg  20 mg Oral TID AC & HS Hall, Carole N, DO   20 mg at 06/03/19 0830  . escitalopram (LEXAPRO) tablet 20 mg  20 mg Oral Daily [place], Archie Patten N, DO   20 mg at 06/03/19  1001  . ferrous sulfate tablet 325 mg  325 mg Oral Q breakfast Kayleen Memos, DO   325 mg at 06/03/19 0830  . hydrOXYzine (ATARAX/VISTARIL) tablet 10 mg  10 mg Oral TID PRN Kayleen Memos, DO      . insulin aspart (novoLOG) injection 0-5 Units  0-5 Units Subcutaneous QHS Hall, Carole N, DO      . insulin aspart (novoLOG) injection 0-9 Units  0-9 Units Subcutaneous TID WC Irene Pap N, DO   1 Units at 05/30/19 9470  . loperamide (IMODIUM) capsule 2 mg  2 mg Oral QID PRN Irene Pap N, DO      . mirtazapine (REMERON) tablet 15 mg  15 mg Oral QHS Dhungel, Nishant, MD   15 mg at 06/02/19 2211  . pantoprazole (PROTONIX) EC tablet 40 mg  40 mg Oral Daily Irene Pap N, DO   40 mg at 06/03/19 1001  . piperacillin-tazobactam (ZOSYN) IVPB 2.25 g  2.25 g Intravenous Q8H Pham, Anh P, RPH 100 mL/hr at 06/03/19 0534 2.25 g at 06/03/19 0534  . polyethylene glycol (MIRALAX / GLYCOLAX) packet 17 g  17 g Oral Daily PRN Irene Pap N, DO      . vitamin B-12 (CYANOCOBALAMIN) tablet 1,000 mcg  1,000 mcg Oral Daily Irene Pap N, DO   1,000 mcg at 06/03/19 1001     Discharge Medications: Please see discharge summary for a list of discharge medications.  Relevant Imaging Results:  Relevant Lab Results:   Additional Information SSN [id number]  Ross Ludwig, LCSW

## 2019-06-04 DIAGNOSIS — R531 Weakness: Secondary | ICD-10-CM

## 2019-06-04 DIAGNOSIS — E43 Unspecified severe protein-calorie malnutrition: Secondary | ICD-10-CM

## 2019-06-04 DIAGNOSIS — R1031 Right lower quadrant pain: Secondary | ICD-10-CM

## 2019-06-04 LAB — CULTURE, BLOOD (ROUTINE X 2)
Culture: NO GROWTH
Culture: NO GROWTH
Special Requests: ADEQUATE

## 2019-06-04 LAB — BASIC METABOLIC PANEL
Anion gap: 9 (ref 5–15)
BUN: 21 mg/dL (ref 8–23)
CO2: 26 mmol/L (ref 22–32)
Calcium: 8.5 mg/dL — ABNORMAL LOW (ref 8.9–10.3)
Chloride: 105 mmol/L (ref 98–111)
Creatinine, Ser: 2.82 mg/dL — ABNORMAL HIGH (ref 0.44–1.00)
GFR calc Af Amer: 18 mL/min — ABNORMAL LOW (ref >60)
GFR calc non Af Amer: 15 mL/min — ABNORMAL LOW (ref >60)
Glucose, Bld: 121 mg/dL — ABNORMAL HIGH (ref 70–99)
Potassium: 3.6 mmol/L (ref 3.5–5.1)
Sodium: 140 mmol/L (ref 135–145)

## 2019-06-04 LAB — TYPE AND SCREEN
ABO/RH(D): A POS
Antibody Screen: NEGATIVE
Unit division: 0
Unit division: 0

## 2019-06-04 LAB — CBC
HCT: 32.2 % — ABNORMAL LOW (ref 36.0–46.0)
Hemoglobin: 10.5 g/dL — ABNORMAL LOW (ref 12.0–15.0)
MCH: 30.7 pg (ref 26.0–34.0)
MCHC: 32.6 g/dL (ref 30.0–36.0)
MCV: 94.2 fL (ref 80.0–100.0)
Platelets: 332 10*3/uL (ref 150–400)
RBC: 3.42 MIL/uL — ABNORMAL LOW (ref 3.87–5.11)
RDW: 19.8 % — ABNORMAL HIGH (ref 11.5–15.5)
WBC: 6.3 10*3/uL (ref 4.0–10.5)
nRBC: 0 % (ref 0.0–0.2)

## 2019-06-04 LAB — BPAM RBC
Blood Product Expiration Date: 202009152359
Blood Product Expiration Date: 202009152359
ISSUE DATE / TIME: 202008201522
Unit Type and Rh: 6200
Unit Type and Rh: 6200

## 2019-06-04 LAB — GLUCOSE, CAPILLARY
Glucose-Capillary: 101 mg/dL — ABNORMAL HIGH (ref 70–99)
Glucose-Capillary: 107 mg/dL — ABNORMAL HIGH (ref 70–99)
Glucose-Capillary: 103 mg/dL — ABNORMAL HIGH (ref 70–99)
Glucose-Capillary: 103 mg/dL — ABNORMAL HIGH (ref 70–99)

## 2019-06-04 MED ORDER — SODIUM CHLORIDE 0.9 % IV SOLN
INTRAVENOUS | Status: AC
  Administered 2019-06-04 – 2019-06-05 (×2): via INTRAVENOUS

## 2019-06-04 NOTE — TOC Progression Note (Signed)
Transition of Care [facility]) - Progression Note    Patient Details  Name: Samantha Mckenzie MRN: [id number] Date of Birth: 01-28-1936  Transition of Care [facility]) CM/SW [place], LCSW Phone Number: 06/04/2019, 2:19 PM  Clinical Narrative:   SNF-[facility] will accept.  Patient will need updated COVID-19 test pending placement.    Expected Discharge Plan: Skilled Nursing Facility Barriers to Discharge: (COVID-19 test pending)  Expected Discharge Plan and Services Expected Discharge Plan: [place]   Discharge Planning Services: CM Consult Post Acute Care Choice: [place] Living arrangements for the past 2 months: Single Family Home Expected Discharge Date: (unknown)                                     Social Determinants of Health (SDOH) Interventions    Readmission Risk Interventions Readmission Risk Prevention Plan 05/31/2019 04/10/2019 03/26/2019  Transportation Screening Complete Complete Complete  PCP or Specialist Appt within 3-5 Days - - Complete  HRI or [place] - - Complete  Social Work Consult for [place] Planning/Counseling - - Complete  Palliative Care Screening - - Not Applicable  Medication Review (RN Care Manager) Referral to Pharmacy Referral to Pharmacy Complete  PCP or Specialist appointment within 3-5 days of discharge Complete Complete -  [place] or Home Care Consult Complete Complete -  SW Recovery Care/Counseling Consult Complete Complete -  Palliative Care Screening Not Applicable Not Applicable -  [place] Not Applicable Not Applicable -  Some recent data might be hidden

## 2019-06-04 NOTE — Progress Notes (Addendum)
PROGRESS NOTE                                                                                                                                                                                                             Patient Demographics:    Preslea Rhodus, is a 83 y.o. female, DOB - March 27, 1936, GBT:[id number]  Admit date - 05/30/2019   Admitting Physician Kayleen Memos, DO  Outpatient Primary MD for the patient is Colon Branch, MD  LOS - 5  Outpatient Specialists:  Chief Complaint  Patient presents with   Nausea       Brief Narrative   83 year old female with history of GERD, prior history of focal enteritis versus?  IBD, type 2 diabetes mellitus with peripheral neuropathy, COPD, chronic diastolic CHF, breast cancer status post left mastectomy was brought to the ED from home with diffuse abdominal pain, nausea, vomiting and diarrhea.  Also reported frank blood in stool, poor p.o. intake. In the ED she was found to be in early sepsis with tachycardia, tachypnea, hypertensive, AKI and hypokalemia.  Also had elevated lactic acid of 2.9.  CT abdomen pelvis without contrast showed findings of terminal ileitis.  Admitted to hospital service on empiric antibiotics.   Subjective:   Still having off-and-on loose bowel movement.  Denies abdominal pain.   Assessment  & Plan :   Sepsis with terminal ileitis, infectious versus inflammatory. Discontinue further antibiotics. GI following and recommends less likely IBD versus gastrointestinal TB versus lymphoma.  ESR is elevated at 55.   TB QuantiFERON drawn this morning..  If negative GI plan on starting prednisone. Family informed GI that they were not interested in pursuing any invasive procedure.  Made DNR.    Active symptoms  Acute blood loss anemia Normocytic anemia with baseline hemoglobin 8.5.  Dropped to 6.8, improved with 1 unit PRBC.  Acute on chronic kidney  disease stage III Suspect dehydration due to GI loss.  Renal function worsened today.  Started IV fluids.  Hypokalemia Replenished.  Magnesium normal.  Failure to thrive and poor p.o. intake PT and nutrition consult. Recommend SNF  Type 2 diabetes mellitus, controlled A1c of 5.2.  Monitor on sliding scale coverage   ?  Depression and frontotemporal dementia. PCP had concern brought up by family that patient was not adherent to treatment regimen.  She tells me that she was having frequent dizzy spells and and headache and feeling depressed for past several weeks.  Reported feeling worthless but denied any suicidal ideation.  She is on Lexapro and dose was recently increased by her PCP.   Psych consult appreciated. Agrees with increased dose of lexapro. QTc normal.   Psych recommends patient does not have capacity to make complex medical decisions for herself.  Family (husband and son) informed about this and are involved in her care planning.  Son told me that husband is the healthcare proxy.    Essential hypertension Continue amlodipine  Dyslipidemia Continue statin  Generalized weakness. Seen by PT and recommend short-term rehab.  Code Status : DNR  Family Communication  : Updated husband  on the phone  Disposition Plan  : SNF depending on decision to start prednisone inpt  Barriers For Discharge : Active symptoms Consults  : GI  Procedures  : CT abdomen  DVT Prophylaxis  : SCDs  Lab Results  Component Value Date   PLT 332 06/04/2019    Antibiotics  :    Anti-infectives (From admission, onward)   Start     Dose/Rate Route Frequency Ordered Stop   05/30/19 1400  piperacillin-tazobactam (ZOSYN) IVPB 2.25 g  Status:  Discontinued     2.25 g 100 mL/hr over 30 Minutes Intravenous Every 8 hours 05/30/19 1343 06/04/19 0858        Objective:   Vitals:   06/03/19 0534 06/03/19 1442 06/03/19 2059 06/04/19 0502  BP: (!) 174/72 (!) 149/65 (!) 130/59 (!) 149/91    Pulse: 75 89 98 99  Resp: _0 Temp: 98.3 F (36.8 C) 98.5 F (36.9 C) 98.5 F (36.9 C) 98.3 F (36.8 C)  TempSrc: Oral Oral Oral Oral  SpO2: 100% 99% 99% 99%  Weight:    50.5 kg  Height:        Wt Readings from Last 3 Encounters:  06/04/19 50.5 kg  05/24/19 59 kg  05/16/19 59 kg     Intake/Output Summary (Last 24 hours) at 06/04/2019 1341 Last data filed at 06/04/2019 1332 Gross per 24 hour  Intake 143 ml  Output --  Net 143 ml    Physical exam  NAD  HEENT: Moist mucosa, supple neck  chest: clear  CVS: NS1&S2 GI: soft, NT, ND Musculoskeletal: warm        Data Review:    CBC Recent Labs  Lab 05/30/19 0616  05/31/19 0717 05/31/19 2246 06/01/19 0502 06/02/19 0530 06/03/19 0844 06/04/19 0824  WBC 10.3  --  6.1  --  5.8 5.5 4.9 6.3  HGB 8.8*   < > 6.8* 10.8* 9.3* 9.7* 11.4* 10.5*  HCT 26.2*   < > 21.4* 33.3* 29.1* 30.0* 36.5 32.2*  PLT 483*  --  366  --  334 333 382 332  MCV 92.9  --  97.3  --  94.8 95.5 97.1 94.2  MCH 31.2  --  30.9  --  30.3 30.9 30.3 30.7  MCHC 33.6  --  31.8  --  32.0 32.3 31.2 32.6  RDW 21.0*  --  21.7*  --  21.5* 20.8* 20.0* 19.8*  LYMPHSABS 0.7  --  1.6  --   --   --   --   --   MONOABS 0.5  --  0.5  --   --   --   --   --   EOSABS 0.0  --  0.1  --   --   --   --   --  BASOSABS 0.0  --  0.0  --   --   --   --   --    < > = values in this interval not displayed.    Chemistries  Recent Labs  Lab 05/30/19 0616 05/30/19 0654 05/31/19 0717 06/01/19 0502 06/02/19 0530 06/04/19 1013  NA 134* 135 139 142 144 140  K 3.0* 3.0* 2.9* 4.3 3.9 3.6  CL 98 99 97* 107 104 105  CO2 18*  --  32 _0 GLUCOSE 171* 168* 90 87 86 121*  BUN 34* 27* 27* _1 CREATININE 2.57* 2.40* 2.14* 2.15* 2.53* 2.82*  CALCIUM 7.8*  --  7.0* 7.7* 8.2* 8.5*  MG  --   --  1.9  --   --   --   AST 20  --  13*  --   --   --   ALT 15  --  11  --   --   --   ALKPHOS 76  --  62  --   --   --   BILITOT 1.1  --  0.5  --   --   --     ------------------------------------------------------------------------------------------------------------------ No results for input(s): CHOL, HDL, LDLCALC, TRIG, CHOLHDL, LDLDIRECT in the last 72 hours.  Lab Results  Component Value Date   HGBA1C 5.2 05/30/2019   ------------------------------------------------------------------------------------------------------------------ No results for input(s): TSH, T4TOTAL, T3FREE, THYROIDAB in the last 72 hours.  Invalid input(s): FREET3 ------------------------------------------------------------------------------------------------------------------ No results for input(s): VITAMINB12, FOLATE, FERRITIN, TIBC, IRON, RETICCTPCT in the last 72 hours.  Coagulation profile Recent Labs  Lab 05/30/19 0616  INR 1.0    No results for input(s): DDIMER in the last 72 hours.  Cardiac Enzymes No results for input(s): CKMB, TROPONINI, MYOGLOBIN in the last 168 hours.  Invalid input(s): CK ------------------------------------------------------------------------------------------------------------------    Component Value Date/Time   BNP 294.1 (H) 04/07/2019 2157    Inpatient Medications  Scheduled Meds:  amLODipine  10 mg Oral Daily   atorvastatin  20 mg Oral Daily   cholecalciferol  1,000 Units Oral Daily   escitalopram  20 mg Oral Daily   ferrous sulfate  325 mg Oral Q breakfast   insulin aspart  0-5 Units Subcutaneous QHS   insulin aspart  0-9 Units Subcutaneous TID WC   mirtazapine  15 mg Oral QHS   pantoprazole  40 mg Oral Daily   vitamin B-12  1,000 mcg Oral Daily   Continuous Infusions:  sodium chloride     PRN Meds:.hydrOXYzine, loperamide, polyethylene glycol  Micro Results Recent Results (from the past 240 hour(s))  Blood culture (routine x 2)     Status: None   Collection Time: 05/30/19  6:16 AM   Specimen: BLOOD  Result Value Ref Range Status   Specimen Description   Final    BLOOD BLOOD RIGHT  FOREARM Performed at [facility], [place] [address] [place], [place] [address]    Special Requests   Final    BOTTLES DRAWN AEROBIC ONLY Blood Culture adequate volume Performed at [place] [address], [place], [place] [address]    Culture   Final    NO GROWTH 5 DAYS Performed at [facility], [place] [address] [place], [place] [address]    Report Status 06/04/2019 FINAL  Final  Blood culture (routine x 2)     Status: None   Collection Time: 05/30/19  6:44 AM   Specimen: BLOOD  Result Value  Ref Range Status   Specimen Description   Final    BLOOD LEFT ANTECUBITAL Performed at [place] [address] [place], [place] [address]    Special Requests   Final    BOTTLES DRAWN AEROBIC AND ANAEROBIC Blood Culture results may not be optimal due to an excessive volume of blood received in culture bottles Performed at [place] [address] [place], [place] [address]    Culture   Final    NO GROWTH 5 DAYS Performed at [facility], [place] [address], [place], [place] [address]    Report Status 06/04/2019 FINAL  Final  SARS Coronavirus 2 [facility] order, Performed in [facility] hospital lab) Nasopharyngeal Nasopharyngeal Swab     Status: None   Collection Time: 05/30/19 12:16 PM   Specimen: Nasopharyngeal Swab  Result Value Ref Range Status   SARS Coronavirus 2 NEGATIVE NEGATIVE Final    Comment: (NOTE) If result is NEGATIVE SARS-CoV-2 target nucleic acids are NOT DETECTED. The SARS-CoV-2 RNA is generally detectable in upper and lower  respiratory specimens during the acute phase of infection. The lowest  concentration of SARS-CoV-2 viral copies this assay can detect is 250  copies / mL. A negative result does not preclude SARS-CoV-2 infection  and should not be used as the sole basis for treatment or other  patient management decisions.  A negative result may occur with  improper  specimen collection / handling, submission of specimen other  than nasopharyngeal swab, presence of viral mutation(s) within the  areas targeted by this assay, and inadequate number of viral copies  (<250 copies / mL). A negative result must be combined with clinical  observations, patient history, and epidemiological information. If result is POSITIVE SARS-CoV-2 target nucleic acids are DETECTED. The SARS-CoV-2 RNA is generally detectable in upper and lower  respiratory specimens dur ing the acute phase of infection.  Positive  results are indicative of active infection with SARS-CoV-2.  Clinical  correlation with patient history and other diagnostic information is  necessary to determine patient infection status.  Positive results do  not rule out bacterial infection or co-infection with other viruses. If result is PRESUMPTIVE POSTIVE SARS-CoV-2 nucleic acids MAY BE PRESENT.   A presumptive positive result was obtained on the submitted specimen  and confirmed on repeat testing.  While 2019 novel coronavirus  (SARS-CoV-2) nucleic acids may be present in the submitted sample  additional confirmatory testing may be necessary for epidemiological  and / or clinical management purposes  to differentiate between  SARS-CoV-2 and other Sarbecovirus currently known to infect humans.  If clinically indicated additional testing with an alternate test  methodology [id number]) is advised. The SARS-CoV-2 RNA is generally  detectable in upper and lower respiratory sp ecimens during the acute  phase of infection. The expected result is Negative. Fact Sheet for Patients:  StrictlyIdeas.no Fact Sheet for Healthcare Providers: BankingDealers.co.za This test is not yet approved or cleared by the [place] FDA and has been authorized for detection and/or diagnosis of SARS-CoV-2 by FDA under an Emergency Use Authorization (EUA).  This EUA will remain in  effect (meaning this test can be used) for the duration of the COVID-19 declaration under Section 564(b)(1) of the Act, 21 U.S.C. section 360bbb-3(b)(1), unless the authorization is terminated or revoked sooner. Performed at [facility], [place] [address] [place],  [address]   C difficile quick scan w PCR reflex     Status: None  Collection Time: 06/03/19 12:14 PM   Specimen: STOOL  Result Value Ref Range Status   C Diff antigen NEGATIVE NEGATIVE Final   C Diff toxin NEGATIVE NEGATIVE Final   C Diff interpretation No C. difficile detected.  Final    Comment: Performed at [facility], [place] [address] [place], [place] [address]    Radiology Reports Ct Abdomen Pelvis Wo Contrast  Result Date: 05/30/2019 CLINICAL DATA:  Generalized abdominal pain, nausea EXAM: CT ABDOMEN AND PELVIS WITHOUT CONTRAST TECHNIQUE: Multidetector CT imaging of the abdomen and pelvis was performed following the standard protocol without IV contrast. COMPARISON:  04/20/2019 FINDINGS: Lower chest: No acute abnormality. Hepatobiliary: No focal liver abnormality is seen. No gallstones, gallbladder wall thickening, or biliary dilatation. Pancreas: Unremarkable. No pancreatic ductal dilatation or surrounding inflammatory changes. Spleen: Scattered calcified granulomas within the spleen. No acute findings. Adrenals/Urinary Tract: Unremarkable adrenal glands. Bilateral renal cortical atrophy. Stable appearance of bilateral renal cysts. Extrarenal pelvis is noted bilaterally. No hydronephrosis. Ureters nondilated. Urinary bladder is moderately distended without acute abnormality. Stomach/Bowel: There is long segment wall thickening with mild surrounding fat stranding of the terminal ileum with area of caliber change in the right lower quadrant (series 2, image 49) and mild distension of the upstream ilium measuring up to 2.9 cm in diameter. Remaining small bowel is within normal limits.  Scattered colonic diverticulosis. No pericolonic inflammatory changes. Small hiatal hernia. Vascular/Lymphatic: Atherosclerotic calcification of the aortoiliac axis. No abdominal or pelvic lymphadenopathy identified. Reproductive: Uterus and bilateral adnexa are unremarkable. Other: No abdominal wall hernia or abnormality. No abdominopelvic ascites. No abscess. Musculoskeletal: Grade 1 anterolisthesis L4 on L5. No acute osseous findings. IMPRESSION: 1. Long segment wall thickening and surrounding mild inflammatory changes of the terminal ileum suggesting terminal ileitis, which can be seen in the setting of inflammatory bowel disease. 2. There is focal caliber change proximally with mild distension of the more proximal ileum raising the possibility of stricture. No evidence to suggest small bowel obstruction. Electronically Signed   By: Davina Poke M.D.   On: 05/30/2019 11:25   Dg Chest 2 View  Result Date: 05/16/2019 CLINICAL DATA:  Altered mental status, left-sided deficit beginning at 11 a.m. this morning. Left shoulder pain EXAM: CHEST - 2 VIEW COMPARISON:  Numerous priors, most recent March 19, 2019 FINDINGS: Streaky bandlike areas of atelectasis are present right mid lung and left lung base. Postsurgical changes are present the left lung. The lungs are otherwise clear. No pneumothorax. No effusions. Stable appearance of the cardiomediastinal contours including rightward deviation of the trachea. Surgical clips project over the left axilla. No acute osseous or soft tissue abnormality. Postthoracotomy changes with prior left rib resection and remote rib fractures. Surgical clips project in the left axilla. IMPRESSION: Bilateral atelectasis. No acute cardiopulmonary disease. Electronically Signed   By: Lovena Le M.D.   On: 05/16/2019 16:12   Ct Head Wo Contrast  Result Date: 05/30/2019 CLINICAL DATA:  Altered level of consciousness EXAM: CT HEAD WITHOUT CONTRAST CT CERVICAL SPINE WITHOUT CONTRAST  TECHNIQUE: Multidetector CT imaging of the head and cervical spine was performed following the standard protocol without intravenous contrast. Multiplanar CT image reconstructions of the cervical spine were also generated. COMPARISON:  05/24/2019 FINDINGS: CT HEAD FINDINGS Brain: No evidence of acute infarction, hemorrhage, extra-axial collection, ventriculomegaly, or mass effect. Generalized cerebral atrophy. Periventricular white matter low attenuation likely secondary to microangiopathy. Vascular: Cerebrovascular atherosclerotic calcifications are noted. Skull: Negative for fracture or focal lesion. Sinuses/Orbits: Visualized portions of  the orbits are unremarkable. Visualized portions of the paranasal sinuses and mastoid air cells are unremarkable. Other: Scalp contusion along the left vertex. CT CERVICAL SPINE FINDINGS Alignment: Normal. Skull base and vertebrae: No acute fracture. No primary bone lesion or focal pathologic process. Erosion along the anterior aspect of the dens as can be seen with crystalline arthropathy. Soft tissues and spinal canal: No prevertebral fluid or swelling. No visible canal hematoma. Disc levels: Degenerative disc disease with disc height loss at C4-5 and C5-6. Right uncovertebral degenerative changes at C4-5 and C5-6 resulting in foraminal stenosis. Mild left foraminal stenosis at C4-5. Upper chest: Left apical scarring. Other: Bilateral carotid artery atherosclerosis. IMPRESSION: 1. No acute intracranial pathology. 2.  No acute osseous injury of the cervical spine. Electronically Signed   By: Kathreen Devoid   On: 05/30/2019 07:33   Ct Head Wo Contrast  Result Date: 05/24/2019 CLINICAL DATA:  Worsening headaches. Recent falls. Nausea and emesis today. EXAM: CT HEAD WITHOUT CONTRAST TECHNIQUE: Contiguous axial images were obtained from the base of the skull through the vertex without intravenous contrast. COMPARISON:  Head CT 05/16/2019 FINDINGS: Brain: No intracranial  hemorrhage, mass effect, or midline shift. Unchanged degree of atrophy and chronic small vessel ischemia. No hydrocephalus. The basilar cisterns are patent. No evidence of territorial infarct or acute ischemia. No extra-axial or intracranial fluid collection. Vascular: Atherosclerosis of skullbase vasculature without hyperdense vessel or abnormal calcification. Skull: No fracture or focal lesion. Sinuses/Orbits: Minor mucosal thickening of ethmoid air cells. No acute findings. No sinus fluid level. Bilateral lens extraction. Other: None. IMPRESSION: 1. No acute intracranial abnormality. 2. Unchanged atrophy and chronic small vessel ischemia. Electronically Signed   By: Keith Rake M.D.   On: 05/24/2019 16:48   Ct Head Wo Contrast  Result Date: 05/16/2019 CLINICAL DATA:  TIA, multiple falls, hit back of head EXAM: CT HEAD WITHOUT CONTRAST TECHNIQUE: Contiguous axial images were obtained from the base of the skull through the vertex without intravenous contrast. COMPARISON:  CT brain 05/01/2019 FINDINGS: Brain: No acute territorial infarction, hemorrhage or intracranial mass. Atrophy and minimal small vessel ischemic change of the white matter. Stable ventricle size. Vascular: No hyperdense vessels. Vertebral and carotid vascular calcification Skull: Normal. Negative for fracture or focal lesion. Sinuses/Orbits: No acute finding. Mild mucosal thickening in the maxillary and ethmoid sinuses Other: None IMPRESSION: 1. No CT evidence for acute intracranial abnormality. 2. Atrophy and minimal small vessel ischemic changes of the white matter Electronically Signed   By: Donavan Foil M.D.   On: 05/16/2019 17:49   Ct Cervical Spine Wo Contrast  Result Date: 05/30/2019 CLINICAL DATA:  Altered level of consciousness EXAM: CT HEAD WITHOUT CONTRAST CT CERVICAL SPINE WITHOUT CONTRAST TECHNIQUE: Multidetector CT imaging of the head and cervical spine was performed following the standard protocol without intravenous  contrast. Multiplanar CT image reconstructions of the cervical spine were also generated. COMPARISON:  05/24/2019 FINDINGS: CT HEAD FINDINGS Brain: No evidence of acute infarction, hemorrhage, extra-axial collection, ventriculomegaly, or mass effect. Generalized cerebral atrophy. Periventricular white matter low attenuation likely secondary to microangiopathy. Vascular: Cerebrovascular atherosclerotic calcifications are noted. Skull: Negative for fracture or focal lesion. Sinuses/Orbits: Visualized portions of the orbits are unremarkable. Visualized portions of the paranasal sinuses and mastoid air cells are unremarkable. Other: Scalp contusion along the left vertex. CT CERVICAL SPINE FINDINGS Alignment: Normal. Skull base and vertebrae: No acute fracture. No primary bone lesion or focal pathologic process. Erosion along the anterior aspect of the dens as can be  seen with crystalline arthropathy. Soft tissues and spinal canal: No prevertebral fluid or swelling. No visible canal hematoma. Disc levels: Degenerative disc disease with disc height loss at C4-5 and C5-6. Right uncovertebral degenerative changes at C4-5 and C5-6 resulting in foraminal stenosis. Mild left foraminal stenosis at C4-5. Upper chest: Left apical scarring. Other: Bilateral carotid artery atherosclerosis. IMPRESSION: 1. No acute intracranial pathology. 2.  No acute osseous injury of the cervical spine. Electronically Signed   By: Kathreen Devoid   On: 05/30/2019 07:33   Dg Chest Portable 1 View  Result Date: 05/30/2019 CLINICAL DATA:  Altered mental status EXAM: PORTABLE CHEST 1 VIEW COMPARISON:  05/16/2019 FINDINGS: The heart size and mediastinal contours are within normal limits. Both lungs are clear. Old left posterior rib fractures. Surgical clips in the left axilla. IMPRESSION: No active disease. Electronically Signed   By: Kathreen Devoid   On: 05/30/2019 06:57   Dg Shoulder Left  Result Date: 05/16/2019 CLINICAL DATA:  Altered mental  status, left-sided deficits, left shoulder pain EXAM: LEFT SHOULDER - 2+ VIEW COMPARISON:  None. FINDINGS: No evidence of fracture or traumatic malalignment. Degenerative changes of the acromioclavicular and lesser changes at the glenohumeral joint. Coracoclavicular and acromioclavicular intervals are maintained. Postsurgical changes are noted in the left axilla as well as postthoracotomy changes prior rib resection and several remote deformities of the left-sided ribs as well as suture material within the left lung. The aorta is calcified. IMPRESSION: 1. No acute osseous abnormality. 2. Mild acromioclavicular and glenohumeral degenerative changes. 3. Post thoracotomy changes left chest wall. Postsurgical changes left axilla. Electronically Signed   By: Lovena Le M.D.   On: 05/16/2019 16:13    Time Spent in minutes 25   Aizley Stenseth M.D on 06/04/2019 at 1:41 PM  Between 7am to 7pm - Pager - [phone]  After 7pm go to [email or web address] - password [facility]  [facility] -  Office  [phone]

## 2019-06-04 NOTE — Progress Notes (Addendum)
[facility] Progress Note  Chief Complaint: Terminal ileitis  History:  I performed a chart review and familiar with this patient's case. Samantha Mckenzie reports feeling about the same today, with no appetite, watery stool and no bleeding.  She denies abdominal pain.  It sounds like she is essentially clinically unchanged over the last couple of days.  ATB QuantiFERON gold test is reportedly pending, with the plan to start prednisone after negative test result.   She feels generally weak, and it sounds like she has not been out of bed much to speak of.  ROS: Cardiovascular: Denies chest pain Respiratory: Denies dyspnea Urinary: Denies dysuria  Objective:   Current Facility-Administered Medications:  .  amLODipine (NORVASC) tablet 10 mg, 10 mg, Oral, Daily, Hall, Carole N, DO, 10 mg at 06/03/19 1001 .  atorvastatin (LIPITOR) tablet 20 mg, 20 mg, Oral, Daily, Hall, Carole N, DO, 20 mg at 06/03/19 1001 .  cholecalciferol (VITAMIN D) tablet 1,000 Units, 1,000 Units, Oral, Daily, Irene Pap N, DO, 1,000 Units at 06/03/19 1001 .  dicyclomine (BENTYL) capsule 20 mg, 20 mg, Oral, TID AC & HS, Hall, Carole N, DO, 20 mg at 06/04/19 0807 .  escitalopram (LEXAPRO) tablet 20 mg, 20 mg, Oral, Daily, Hall, Carole N, DO, 20 mg at 06/03/19 1001 .  ferrous sulfate tablet 325 mg, 325 mg, Oral, Q breakfast, Irene Pap N, DO, 325 mg at 06/04/19 5110 .  hydrOXYzine (ATARAX/VISTARIL) tablet 10 mg, 10 mg, Oral, TID PRN, Irene Pap N, DO .  insulin aspart (novoLOG) injection 0-5 Units, 0-5 Units, Subcutaneous, QHS, Hall, Carole N, DO .  insulin aspart (novoLOG) injection 0-9 Units, 0-9 Units, Subcutaneous, TID WC, Hall, Carole N, DO, 1 Units at 06/03/19 1500 .  loperamide (IMODIUM) capsule 2 mg, 2 mg, Oral, QID PRN, Irene Pap N, DO .  mirtazapine (REMERON) tablet 15 mg, 15 mg, Oral, QHS, Dhungel, Nishant, MD, 15 mg at 06/03/19 2135 .  pantoprazole (PROTONIX) EC tablet 40 mg, 40 mg, Oral, Daily, Hall, Carole N, DO,  40 mg at 06/03/19 1001 .  piperacillin-tazobactam (ZOSYN) IVPB 2.25 g, 2.25 g, Intravenous, Q8H, Pham, Anh P, RPH, Last Rate: 100 mL/hr at 06/04/19 0548, 2.25 g at 06/04/19 0548 .  polyethylene glycol (MIRALAX / GLYCOLAX) packet 17 g, 17 g, Oral, Daily PRN, Hall, Carole N, DO .  vitamin B-12 (CYANOCOBALAMIN) tablet 1,000 mcg, 1,000 mcg, Oral, Daily, Hall, Carole N, DO, 1,000 mcg at 06/03/19 1001  . piperacillin-tazobactam (ZOSYN)  IV 2.25 g (06/04/19 0548)     Vital signs in last 24 hrs: Vitals:   06/03/19 2059 06/04/19 0502  BP: (!) 130/59 (!) 149/91  Pulse: 98 99  Resp: 16 12  Temp: 98.5 F (36.9 C) 98.3 F (36.8 C)  SpO2: 99% 99%    Intake/Output Summary (Last 24 hours) at 06/04/2019 0845 Last data filed at 06/03/2019 1200 Gross per 24 hour  Intake 50 ml  Output -  Net 50 ml     Physical Exam Poor muscle mass, chronically ill-appearing, nontoxic.  Alert, and conversational.  He had just been on the phone with her husband when I enter the room.  HEENT: sclera anicteric, oral mucosa without lesions  Neck: supple, no thyromegaly, JVD or lymphadenopathy  Cardiac: RRR without murmurs, S1S2 heard, no peripheral edema  Pulm: clear to auscultation bilaterally, normal RR and effort noted  Abdomen: soft, no distention or tenderness, with active bowel sounds. No guarding or palpable hepatosplenomegaly  Skin; warm and dry, no jaundice  Recent Labs:  CBC Latest Ref Rng & Units 06/03/2019 06/02/2019 06/01/2019  WBC 4.0 - 10.5 K/uL 4.9 5.5 5.8  Hemoglobin 12.0 - 15.0 g/dL 11.4(L) 9.7(L) 9.3(L)  Hematocrit 36.0 - 46.0 % 36.5 30.0(L) 29.1(L)  Platelets 150 - 400 K/uL 382 333 334    Recent Labs  Lab 05/30/19 0616  INR 1.0   CMP Latest Ref Rng & Units 06/02/2019 06/01/2019 05/31/2019  Glucose 70 - 99 mg/dL 86 87 90  BUN 8 - 23 mg/dL 19 19 27(H)  Creatinine 0.44 - 1.00 mg/dL 2.53(H) 2.15(H) 2.14(H)  Sodium 135 - 145 mmol/L 144 142 139  Potassium 3.5 - 5.1 mmol/L 3.9 4.3 2.9(L)   Chloride 98 - 111 mmol/L 104 107 97(L)  CO2 22 - 32 mmol/L 28 27 32  Calcium 8.9 - 10.3 mg/dL 8.2(L) 7.7(L) 7.0(L)  Total Protein 6.5 - 8.1 g/dL - - 5.6(L)  Total Bilirubin 0.3 - 1.2 mg/dL - - 0.5  Alkaline Phos 38 - 126 U/L - - 62  AST 15 - 41 U/L - - 13(L)  ALT 0 - 44 U/L - - 11   No chemistry panel yesterday, and if one was ordered for today, it is not yet resulted.  No result on TB QuantiFERON gold.  There appears to be one ordered for 3:00 this afternoon  @ASSESSMENTPLANBEGIN @ Assessment: Terminal ileitis, worsening over the last year, and especially within the last few months.  Repeated hospitalizations for abdominal pain with diarrhea and volume depletion in the setting of multiple other medical issues including chronic kidney disease.  Suspicions are Crohn's disease, lymphoma, tuberculosis.  Chronic right lower quadrant abdominal pain that the patient reports is now resolved.  No tenderness on exam.  No clinical signs of obstruction.  Chronic diarrhea from the same process.  It is possibly worse since hospitalization due to antibiotics.  Severe protein calorie malnutrition, albumin 2.1  Plan: I spoke with this patient's hospitalist, who believes that the TB test could not be drawn over the weekend for some reason.  If so, this will delay the test results and delay beginning prednisone therapy if appropriate. I will speak with my associates who composed this plan, and see if they would feel comfortable having this patient go out to rehab after confirmation of the QuantiFERON test has been drawn and results expected within a few days, to then have prednisone started in several days if test result is negative.  It seems the patient and family have decided not to pursue a colonoscopy.  I also conferred with the hospitalist, and we agreed to discontinue the antibiotics. I have also stop the dicyclomine because I am concerned it could be contributing to altered mental status in this  elderly patient.  She is on Imodium as needed for diarrhea.  Total time 35 minutes, over half spent in counseling and coordinating care.  Samantha Mckenzie Office: [phone]  Addendum :  Creatinine increasing from few days ago - so most likely staying until that is improved.  We will follow.  Samantha Lips, MD

## 2019-06-04 NOTE — Progress Notes (Signed)
Patient wanted me to talk to her son [name] [phone]). He wanted to know why she wasn't getting the colonoscopy done. Advised per family & patient no invasive procedures were wanted. Patient states she never said that and doesn't know where that came from. Patient was deemed by psych as unable to make her own decisions. States that she has a legal guardian, but no paperwork is on shadow chart. Made Dr. Clementeen Graham aware and he will follow up with GI tomorrow.

## 2019-06-05 ENCOUNTER — Ambulatory Visit: Payer: Medicare Other | Admitting: Adult Health

## 2019-06-05 LAB — GLUCOSE, CAPILLARY
Glucose-Capillary: 109 mg/dL — ABNORMAL HIGH (ref 70–99)
Glucose-Capillary: 102 mg/dL — ABNORMAL HIGH (ref 70–99)
Glucose-Capillary: 101 mg/dL — ABNORMAL HIGH (ref 70–99)
Glucose-Capillary: 127 mg/dL — ABNORMAL HIGH (ref 70–99)

## 2019-06-05 LAB — BASIC METABOLIC PANEL
Anion gap: 6 (ref 5–15)
BUN: 16 mg/dL (ref 8–23)
CO2: 23 mmol/L (ref 22–32)
Calcium: 7.7 mg/dL — ABNORMAL LOW (ref 8.9–10.3)
Chloride: 112 mmol/L — ABNORMAL HIGH (ref 98–111)
Creatinine, Ser: 2.25 mg/dL — ABNORMAL HIGH (ref 0.44–1.00)
GFR calc Af Amer: 23 mL/min — ABNORMAL LOW (ref >60)
GFR calc non Af Amer: 20 mL/min — ABNORMAL LOW (ref >60)
Glucose, Bld: 112 mg/dL — ABNORMAL HIGH (ref 70–99)
Potassium: 3.4 mmol/L — ABNORMAL LOW (ref 3.5–5.1)
Sodium: 141 mmol/L (ref 135–145)

## 2019-06-05 LAB — NOVEL CORONAVIRUS, NAA (HOSP ORDER, SEND-OUT TO REF LAB; TAT 18-24 HRS): SARS-CoV-2, NAA: NOT DETECTED

## 2019-06-05 MED ORDER — ONDANSETRON HCL 4 MG/2ML IJ SOLN
4.0000 mg | Freq: Four times a day (QID) | INTRAMUSCULAR | Status: DC | PRN
  Administered 2019-06-05 – 2019-06-06 (×2): 4 mg via INTRAVENOUS
  Filled 2019-06-05: qty 2

## 2019-06-05 MED ORDER — ONDANSETRON HCL 4 MG/2ML IJ SOLN
4.0000 mg | Freq: Three times a day (TID) | INTRAMUSCULAR | Status: DC
  Administered 2019-06-05 (×2): 4 mg via INTRAVENOUS
  Filled 2019-06-05 (×2): qty 2

## 2019-06-05 MED ORDER — PEG-KCL-NACL-NASULF-NA ASC-C 100 G PO SOLR: 1.0000 | Freq: Once | ORAL | Status: DC

## 2019-06-05 MED ORDER — PEG-KCL-NACL-NASULF-NA ASC-C 100 G PO SOLR
0.5000 | Freq: Once | ORAL | Status: AC
  Administered 2019-06-05: 100 g via ORAL
  Filled 2019-06-05: qty 1

## 2019-06-05 MED ORDER — POTASSIUM CHLORIDE CRYS ER 20 MEQ PO TBCR
40.0000 meq | EXTENDED_RELEASE_TABLET | Freq: Once | ORAL | Status: AC
  Administered 2019-06-05: 09:00:00 40 meq via ORAL
  Filled 2019-06-05: qty 2

## 2019-06-05 NOTE — Care Management Important Message (Signed)
Important Message  Patient Details IM Letter given to Sharren Bridge SW to present to the Patient Name: Samantha Mckenzie MRN: [id number] Date of Birth: [birth year]   Medicare Important Message Given:  Yes     Kerin Salen 06/05/2019, 10:41 AM

## 2019-06-05 NOTE — Progress Notes (Addendum)
Patient ID: Samantha Mckenzie, female   DOB: [birth year], 83 y.o.   MRN: [id number]    Progress Note   Subjective  Day # 7 CC: terminal ileitis  Quantiferon gold - pend GI path panel - pend Covid 19 - reordered yesterday - P  Patient says she has no appetite, denies any nausea, no complaints of abdominal pain but says her diarrhea is persisting and is watery.  She thinks she had 3-4 bowel movements yesterday and has had 1 liquid stool this morning. She states that she does want to have a colonoscopy if it will help her have a diagnosis, and help her get better.     Objective   Vital signs in last 24 hours: Temp:  [98.4 F (36.9 C)-99.1 F (37.3 C)] 99.1 F (37.3 C) (08/25 0428) Pulse Rate:  [86-115] 101 (08/25 0428) Resp:  [15-18] 18 (08/25 0428) BP: (130-154)/(42-71) 154/42 (08/25 0428) SpO2:  [98 %-100 %] 100 % (08/25 0428) Weight:  [52 kg] 52 kg (08/25 0500) Last BM Date: 06/03/19 General: Elderly    white female in NAD Heart:  Regular rate and rhythm; no murmurs Lungs: Respirations even and unlabored, lungs CTA bilaterally Abdomen:  Soft, nontender and nondistended. Normal bowel sounds. Extremities:  Without edema. Neurologic:  Alert and oriented,  grossly normal neurologically. Psych:  Cooperative. Normal mood and affect.  Intake/Output from previous day: 08/24 0701 - 08/25 0700 In: 373.5 [P.O.:143; I.V.:230.5] Out: 400 [Urine:400] Intake/Output this shift: No intake/output data recorded.  Lab Results: Recent Labs    06/03/19 0844 06/04/19 0824  WBC 4.9 6.3  HGB 11.4* 10.5*  HCT 36.5 32.2*  PLT 382 332   BMET Recent Labs    06/04/19 1013 06/05/19 0557  NA 140 141  K 3.6 3.4*  CL 105 112*  CO2 26 23  GLUCOSE 121* 112*  BUN 21 16  CREATININE 2.82* 2.25*  CALCIUM 8.5* 7.7*   LFT No results for input(s): PROT, ALBUMIN, AST, ALT, ALKPHOS, BILITOT, BILIDIR, IBILI in the last 72 hours. PT/INR No results for input(s): LABPROT, INR in the last 72  hours.      Assessment / Plan:    #37 83 year old female with persistent watery stool over the past several weeks, admitted with weakness, nausea and abdominal pain. CT imaging very concerning for terminal ileitis involving a long segment of the terminal ileum.  Sed rate is elevated .  Suspect Crohn's disease  Patient and family had previously apparently been reluctant to proceed with colonoscopy.  Patient is now in agreement though reluctant to drink a bowel prep as she fears nausea.  Previous plan was to give her an empiric trial of prednisone once GI path panel and QuantiFERON gold had resulted.  #2 recent altered mental status-patient presents a very appropriate today #3 chronic kidney disease-had bump in creatinine, now improving #4 anemia chronic #5 depression-patient admits to feeling depressed today-will defer to medicine.  Trial of Remeron 15 mg, started last week, consider increased dose as also has appetite stimulating effect.  Plan: Clear liquid diet today,  Patient will be scheduled for colonoscopy tomorrow morning, with prep to start later today. Colonoscopy was discussed in detail with the patient including indications risks and benefits and she is agreeable to proceed.  I will also speak with her son today. Nurses have been unable to collect stool specimen for GI path panel as stool very watery and is getting mixed with urine. Asked to continue to try through today.   Principal  Problem:   Depression Active Problems:   Normocytic anemia   Essential hypertension   Generalized weakness   Nausea vomiting and diarrhea   Acute renal failure superimposed on stage 4 chronic kidney disease (HCC)   Lactic acidosis   Hypokalemia   Terminal ileitis with complication (HCC)   Ileitis   Dehydration   Acute blood loss anemia    LOS: 6 days   Amy EsterwoodPA-C  06/05/2019, 8:51 AM      Attending Physician Note   I have taken an interval history, reviewed the chart and  examined the patient. I agree with the Advanced Practitioner's note, impression and recommendations.   Lucio Edward, MD [facility]

## 2019-06-05 NOTE — Progress Notes (Signed)
PROGRESS NOTE                                                                                                                                                                                                             Patient Demographics:    Analycia Mckenzie, is a 83 y.o. female, DOB - [birth year], RAQ:[id number]  Admit date - 05/30/2019   Admitting Physician Kayleen Memos, DO  Outpatient Primary MD for the patient is Colon Branch, MD  LOS - 6  Outpatient Specialists:  Chief Complaint  Patient presents with   Nausea       Brief Narrative   83 year old female with history of GERD, prior history of focal enteritis versus?  IBD, type 2 diabetes mellitus with peripheral neuropathy, COPD, chronic diastolic CHF, breast cancer status post left mastectomy was brought to the ED from home with diffuse abdominal pain, nausea, vomiting and diarrhea.  Also reported frank blood in stool, poor p.o. intake. In the ED she was found to be in early sepsis with tachycardia, tachypnea, hypertensive, AKI and hypokalemia.  Also had elevated lactic acid of 2.9.  CT abdomen pelvis without contrast showed findings of terminal ileitis.  Admitted to hospital service on empiric antibiotics.   Subjective:  Reports having 3 loose bowel movement yesterday.  Denies any abdominal pain.   Assessment  & Plan :   Sepsis with terminal ileitis, infectious versus inflammatory. Sepsis resolved.  Antibiotics discontinued as it is not helping her with her symptoms.  (received for 5 days) GI following and recommends less likely IBD versus gastrointestinal TB versus lymphoma.  ESR is elevated at 55.   TB QuantiFERON sent on 8/24,  results pending.   patient and family now wish for colonoscopy.  Scheduled for tomorrow. Stool for C. difficile negative.  GI pathogen panel still pending.  Active symptoms  Acute blood loss anemia Normocytic anemia with baseline  hemoglobin 8.5.  Dropped to 6.8, improved with 1 unit PRBC.  Acute on chronic kidney disease stage III Suspect dehydration due to GI loss.  Improved with IV fluids..  Hypokalemia Replenished.  Magnesium normal.  Failure to thrive and poor p.o. intake PT and nutrition consulted.  Recommend SNF  Type 2 diabetes mellitus, controlled A1c of 5.2.  Monitor on sliding scale coverage   ?  Depression and frontotemporal dementia. PCP  had concern brought up by family that patient was not adherent to treatment regimen.  She tells me that she was having frequent dizzy spells and and headache and feeling depressed for past several weeks.  Reported feeling worthless but denied any suicidal ideation.  She is on Lexapro and dose was recently increased by her PCP.   Psych consult appreciated. Agrees with increased dose of lexapro. QTc normal.   Psych recommends patient does not have capacity to make complex medical decisions for herself.  Family (husband and son) informed about this and are involved in her care planning.  Son told me that husband is the healthcare proxy.    Essential hypertension Continue amlodipine  Dyslipidemia Continue statin  Code Status : DNR  Family Communication  : Updated husband  on the phone  Disposition Plan  : Pending colonoscopy tomorrow.  SNF possibly in the next 48 hours.  (Also awaiting QuantiFERON test to decide on starting steroid).  Barriers For Discharge : Active symptoms Consults  : GI  Procedures  : CT abdomen  DVT Prophylaxis  : SCDs  Lab Results  Component Value Date   PLT 332 06/04/2019    Antibiotics  :    Anti-infectives (From admission, onward)   Start     Dose/Rate Route Frequency Ordered Stop   05/30/19 1400  piperacillin-tazobactam (ZOSYN) IVPB 2.25 g  Status:  Discontinued     2.25 g 100 mL/hr over 30 Minutes Intravenous Every 8 hours 05/30/19 1343 06/04/19 0858        Objective:   Vitals:   06/04/19 2035 06/04/19 2110  06/05/19 0428 06/05/19 0500  BP: (!) 130/55  (!) 154/42   Pulse: (!) 115 95 (!) 101   Resp: 15  18   Temp: 98.4 F (36.9 C)  99.1 F (37.3 C)   TempSrc: Oral  Oral   SpO2: 98% 100% 100%   Weight:    52 kg  Height:        Wt Readings from Last 3 Encounters:  06/05/19 52 kg  05/24/19 59 kg  05/16/19 59 kg     Intake/Output Summary (Last 24 hours) at 06/05/2019 1135 Last data filed at 06/04/2019 2308 Gross per 24 hour  Intake 348.53 ml  Output 400 ml  Net -51.47 ml   Physical exam Not in distress HEENT: Pallor present, moist mucosa, supple neck Chest: Clear bilaterally CVs: Normal S1-S2 GI: Soft, nondistended, nontender Musculoskeletal: Warm, no edema        Data Review:    CBC Recent Labs  Lab 05/30/19 0616  05/31/19 0717 05/31/19 2246 06/01/19 0502 06/02/19 0530 06/03/19 0844 06/04/19 0824  WBC 10.3  --  6.1  --  5.8 5.5 4.9 6.3  HGB 8.8*   < > 6.8* 10.8* 9.3* 9.7* 11.4* 10.5*  HCT 26.2*   < > 21.4* 33.3* 29.1* 30.0* 36.5 32.2*  PLT 483*  --  366  --  334 333 382 332  MCV 92.9  --  97.3  --  94.8 95.5 97.1 94.2  MCH 31.2  --  30.9  --  30.3 30.9 30.3 30.7  MCHC 33.6  --  31.8  --  32.0 32.3 31.2 32.6  RDW 21.0*  --  21.7*  --  21.5* 20.8* 20.0* 19.8*  LYMPHSABS 0.7  --  1.6  --   --   --   --   --   MONOABS 0.5  --  0.5  --   --   --   --   --  EOSABS 0.0  --  0.1  --   --   --   --   --   BASOSABS 0.0  --  0.0  --   --   --   --   --    < > = values in this interval not displayed.    Chemistries  Recent Labs  Lab 05/30/19 0616  05/31/19 0717 06/01/19 0502 06/02/19 0530 06/04/19 1013 06/05/19 0557  NA 134*   < > 139 142 144 140 141  K 3.0*   < > 2.9* 4.3 3.9 3.6 3.4*  CL 98   < > 97* 107 104 105 112*  CO2 18*  --  32 27 28 26 23   GLUCOSE 171*   < > 90 87 86 121* 112*  BUN 34*   < > 27* 19 19 21 16   CREATININE 2.57*   < > 2.14* 2.15* 2.53* 2.82* 2.25*  CALCIUM 7.8*  --  7.0* 7.7* 8.2* 8.5* 7.7*  MG  --   --  1.9  --   --   --   --     AST 20  --  13*  --   --   --   --   ALT 15  --  11  --   --   --   --   ALKPHOS 76  --  62  --   --   --   --   BILITOT 1.1  --  0.5  --   --   --   --    < > = values in this interval not displayed.   ------------------------------------------------------------------------------------------------------------------ No results for input(s): CHOL, HDL, LDLCALC, TRIG, CHOLHDL, LDLDIRECT in the last 72 hours.  Lab Results  Component Value Date   HGBA1C 5.2 05/30/2019   ------------------------------------------------------------------------------------------------------------------ No results for input(s): TSH, T4TOTAL, T3FREE, THYROIDAB in the last 72 hours.  Invalid input(s): FREET3 ------------------------------------------------------------------------------------------------------------------ No results for input(s): VITAMINB12, FOLATE, FERRITIN, TIBC, IRON, RETICCTPCT in the last 72 hours.  Coagulation profile Recent Labs  Lab 05/30/19 0616  INR 1.0    No results for input(s): DDIMER in the last 72 hours.  Cardiac Enzymes No results for input(s): CKMB, TROPONINI, MYOGLOBIN in the last 168 hours.  Invalid input(s): CK ------------------------------------------------------------------------------------------------------------------    Component Value Date/Time   BNP 294.1 (H) 04/07/2019 2157    Inpatient Medications  Scheduled Meds:  amLODipine  10 mg Oral Daily   atorvastatin  20 mg Oral Daily   cholecalciferol  1,000 Units Oral Daily   escitalopram  20 mg Oral Daily   ferrous sulfate  325 mg Oral Q breakfast   insulin aspart  0-5 Units Subcutaneous QHS   insulin aspart  0-9 Units Subcutaneous TID WC   mirtazapine  15 mg Oral QHS   ondansetron  4 mg Intravenous TID   pantoprazole  40 mg Oral Daily   peg 3350 powder  0.5 kit Oral Once   And   peg 3350 powder  0.5 kit Oral Once   vitamin B-12  1,000 mcg Oral Daily   Continuous Infusions:   sodium chloride 100 mL/hr at 06/05/19 0102   PRN Meds:.loperamide, polyethylene glycol  Micro Results Recent Results (from the past 240 hour(s))  Blood culture (routine x 2)     Status: None   Collection Time: 05/30/19  6:16 AM   Specimen: BLOOD  Result Value Ref Range Status   Specimen Description   Final    BLOOD BLOOD RIGHT  FOREARM Performed at [facility], [place] [address] [place], [place] [address]    Special Requests   Final    BOTTLES DRAWN AEROBIC ONLY Blood Culture adequate volume Performed at [place] [address] [place], [place] [address]    Culture   Final    NO GROWTH 5 DAYS Performed at [facility], [place] [address] [place], [place] [address]    Report Status 06/04/2019 FINAL  Final  Blood culture (routine x 2)     Status: None   Collection Time: 05/30/19  6:44 AM   Specimen: BLOOD  Result Value Ref Range Status   Specimen Description   Final    BLOOD LEFT ANTECUBITAL Performed at [place] [address], [place], [place] [address]    Special Requests   Final    BOTTLES DRAWN AEROBIC AND ANAEROBIC Blood Culture results may not be optimal due to an excessive volume of blood received in culture bottles Performed at [place] [address] [place], [place] [address]    Culture   Final    NO GROWTH 5 DAYS Performed at [facility], [place] [address] [place], [place] [address]    Report Status 06/04/2019 FINAL  Final  SARS Coronavirus 2 [facility] order, Performed in [facility] hospital lab) Nasopharyngeal Nasopharyngeal Swab     Status: None   Collection Time: 05/30/19 12:16 PM   Specimen: Nasopharyngeal Swab  Result Value Ref Range Status   SARS Coronavirus 2 NEGATIVE NEGATIVE Final    Comment: (NOTE) If result is NEGATIVE SARS-CoV-2 target nucleic acids are NOT DETECTED. The SARS-CoV-2 RNA is generally detectable in upper and lower  respiratory  specimens during the acute phase of infection. The lowest  concentration of SARS-CoV-2 viral copies this assay can detect is 250  copies / mL. A negative result does not preclude SARS-CoV-2 infection  and should not be used as the sole basis for treatment or other  patient management decisions.  A negative result may occur with  improper specimen collection / handling, submission of specimen other  than nasopharyngeal swab, presence of viral mutation(s) within the  areas targeted by this assay, and inadequate number of viral copies  (<250 copies / mL). A negative result must be combined with clinical  observations, patient history, and epidemiological information. If result is POSITIVE SARS-CoV-2 target nucleic acids are DETECTED. The SARS-CoV-2 RNA is generally detectable in upper and lower  respiratory specimens dur ing the acute phase of infection.  Positive  results are indicative of active infection with SARS-CoV-2.  Clinical  correlation with patient history and other diagnostic information is  necessary to determine patient infection status.  Positive results do  not rule out bacterial infection or co-infection with other viruses. If result is PRESUMPTIVE POSTIVE SARS-CoV-2 nucleic acids MAY BE PRESENT.   A presumptive positive result was obtained on the submitted specimen  and confirmed on repeat testing.  While 2019 novel coronavirus  (SARS-CoV-2) nucleic acids may be present in the submitted sample  additional confirmatory testing may be necessary for epidemiological  and / or clinical management purposes  to differentiate between  SARS-CoV-2 and other Sarbecovirus currently known to infect humans.  If clinically indicated additional testing with an alternate test  methodology [phone]) is advised. The SARS-CoV-2 RNA is generally  detectable in upper and lower respiratory sp ecimens during the acute  phase of infection. The expected result is Negative. Fact Sheet  for  Patients:  StrictlyIdeas.no Fact Sheet for Healthcare Providers: BankingDealers.co.za This test is not yet approved or cleared by the [place] FDA and has been authorized for detection and/or diagnosis of SARS-CoV-2 by FDA under an Emergency Use Authorization (EUA).  This EUA will remain in effect (meaning this test can be used) for the duration of the COVID-19 declaration under Section 564(b)(1) of the Act, 21 U.S.C. section 360bbb-3(b)(1), unless the authorization is terminated or revoked sooner. Performed at [facility], [place] [address], [place], [place] [address]   C difficile quick scan w PCR reflex     Status: None   Collection Time: 06/03/19 12:14 PM   Specimen: STOOL  Result Value Ref Range Status   C Diff antigen NEGATIVE NEGATIVE Final   C Diff toxin NEGATIVE NEGATIVE Final   C Diff interpretation No C. difficile detected.  Final    Comment: Performed at [facility], [place] [address], [place], [place] [address]    Radiology Reports Ct Abdomen Pelvis Wo Contrast  Result Date: 05/30/2019 CLINICAL DATA:  Generalized abdominal pain, nausea EXAM: CT ABDOMEN AND PELVIS WITHOUT CONTRAST TECHNIQUE: Multidetector CT imaging of the abdomen and pelvis was performed following the standard protocol without IV contrast. COMPARISON:  04/20/2019 FINDINGS: Lower chest: No acute abnormality. Hepatobiliary: No focal liver abnormality is seen. No gallstones, gallbladder wall thickening, or biliary dilatation. Pancreas: Unremarkable. No pancreatic ductal dilatation or surrounding inflammatory changes. Spleen: Scattered calcified granulomas within the spleen. No acute findings. Adrenals/Urinary Tract: Unremarkable adrenal glands. Bilateral renal cortical atrophy. Stable appearance of bilateral renal cysts. Extrarenal pelvis is noted bilaterally. No hydronephrosis. Ureters nondilated. Urinary bladder is moderately  distended without acute abnormality. Stomach/Bowel: There is long segment wall thickening with mild surrounding fat stranding of the terminal ileum with area of caliber change in the right lower quadrant (series 2, image 49) and mild distension of the upstream ilium measuring up to 2.9 cm in diameter. Remaining small bowel is within normal limits. Scattered colonic diverticulosis. No pericolonic inflammatory changes. Small hiatal hernia. Vascular/Lymphatic: Atherosclerotic calcification of the aortoiliac axis. No abdominal or pelvic lymphadenopathy identified. Reproductive: Uterus and bilateral adnexa are unremarkable. Other: No abdominal wall hernia or abnormality. No abdominopelvic ascites. No abscess. Musculoskeletal: Grade 1 anterolisthesis L4 on L5. No acute osseous findings. IMPRESSION: 1. Long segment wall thickening and surrounding mild inflammatory changes of the terminal ileum suggesting terminal ileitis, which can be seen in the setting of inflammatory bowel disease. 2. There is focal caliber change proximally with mild distension of the more proximal ileum raising the possibility of stricture. No evidence to suggest small bowel obstruction. Electronically Signed   By: Davina Poke M.D.   On: 05/30/2019 11:25   Dg Chest 2 View  Result Date: 05/16/2019 CLINICAL DATA:  Altered mental status, left-sided deficit beginning at 11 a.m. this morning. Left shoulder pain EXAM: CHEST - 2 VIEW COMPARISON:  Numerous priors, most recent March 19, 2019 FINDINGS: Streaky bandlike areas of atelectasis are present right mid lung and left lung base. Postsurgical changes are present the left lung. The lungs are otherwise clear. No pneumothorax. No effusions. Stable appearance of the cardiomediastinal contours including rightward deviation of the trachea. Surgical clips project over the left axilla. No acute osseous or soft tissue abnormality. Postthoracotomy changes with prior left rib resection and remote rib  fractures. Surgical clips project in the left axilla. IMPRESSION: Bilateral atelectasis. No acute cardiopulmonary disease. Electronically Signed   By: Elwin Sleight.D.  On: 05/16/2019 16:12   Ct Head Wo Contrast  Result Date: 05/30/2019 CLINICAL DATA:  Altered level of consciousness EXAM: CT HEAD WITHOUT CONTRAST CT CERVICAL SPINE WITHOUT CONTRAST TECHNIQUE: Multidetector CT imaging of the head and cervical spine was performed following the standard protocol without intravenous contrast. Multiplanar CT image reconstructions of the cervical spine were also generated. COMPARISON:  05/24/2019 FINDINGS: CT HEAD FINDINGS Brain: No evidence of acute infarction, hemorrhage, extra-axial collection, ventriculomegaly, or mass effect. Generalized cerebral atrophy. Periventricular white matter low attenuation likely secondary to microangiopathy. Vascular: Cerebrovascular atherosclerotic calcifications are noted. Skull: Negative for fracture or focal lesion. Sinuses/Orbits: Visualized portions of the orbits are unremarkable. Visualized portions of the paranasal sinuses and mastoid air cells are unremarkable. Other: Scalp contusion along the left vertex. CT CERVICAL SPINE FINDINGS Alignment: Normal. Skull base and vertebrae: No acute fracture. No primary bone lesion or focal pathologic process. Erosion along the anterior aspect of the dens as can be seen with crystalline arthropathy. Soft tissues and spinal canal: No prevertebral fluid or swelling. No visible canal hematoma. Disc levels: Degenerative disc disease with disc height loss at C4-5 and C5-6. Right uncovertebral degenerative changes at C4-5 and C5-6 resulting in foraminal stenosis. Mild left foraminal stenosis at C4-5. Upper chest: Left apical scarring. Other: Bilateral carotid artery atherosclerosis. IMPRESSION: 1. No acute intracranial pathology. 2.  No acute osseous injury of the cervical spine. Electronically Signed   By: Kathreen Devoid   On: 05/30/2019 07:33    Ct Head Wo Contrast  Result Date: 05/24/2019 CLINICAL DATA:  Worsening headaches. Recent falls. Nausea and emesis today. EXAM: CT HEAD WITHOUT CONTRAST TECHNIQUE: Contiguous axial images were obtained from the base of the skull through the vertex without intravenous contrast. COMPARISON:  Head CT 05/16/2019 FINDINGS: Brain: No intracranial hemorrhage, mass effect, or midline shift. Unchanged degree of atrophy and chronic small vessel ischemia. No hydrocephalus. The basilar cisterns are patent. No evidence of territorial infarct or acute ischemia. No extra-axial or intracranial fluid collection. Vascular: Atherosclerosis of skullbase vasculature without hyperdense vessel or abnormal calcification. Skull: No fracture or focal lesion. Sinuses/Orbits: Minor mucosal thickening of ethmoid air cells. No acute findings. No sinus fluid level. Bilateral lens extraction. Other: None. IMPRESSION: 1. No acute intracranial abnormality. 2. Unchanged atrophy and chronic small vessel ischemia. Electronically Signed   By: Keith Rake M.D.   On: 05/24/2019 16:48   Ct Head Wo Contrast  Result Date: 05/16/2019 CLINICAL DATA:  TIA, multiple falls, hit back of head EXAM: CT HEAD WITHOUT CONTRAST TECHNIQUE: Contiguous axial images were obtained from the base of the skull through the vertex without intravenous contrast. COMPARISON:  CT brain 05/01/2019 FINDINGS: Brain: No acute territorial infarction, hemorrhage or intracranial mass. Atrophy and minimal small vessel ischemic change of the white matter. Stable ventricle size. Vascular: No hyperdense vessels. Vertebral and carotid vascular calcification Skull: Normal. Negative for fracture or focal lesion. Sinuses/Orbits: No acute finding. Mild mucosal thickening in the maxillary and ethmoid sinuses Other: None IMPRESSION: 1. No CT evidence for acute intracranial abnormality. 2. Atrophy and minimal small vessel ischemic changes of the white matter Electronically Signed   By: Donavan Foil M.D.   On: 05/16/2019 17:49   Ct Cervical Spine Wo Contrast  Result Date: 05/30/2019 CLINICAL DATA:  Altered level of consciousness EXAM: CT HEAD WITHOUT CONTRAST CT CERVICAL SPINE WITHOUT CONTRAST TECHNIQUE: Multidetector CT imaging of the head and cervical spine was performed following the standard protocol without intravenous contrast. Multiplanar CT image reconstructions of the  cervical spine were also generated. COMPARISON:  05/24/2019 FINDINGS: CT HEAD FINDINGS Brain: No evidence of acute infarction, hemorrhage, extra-axial collection, ventriculomegaly, or mass effect. Generalized cerebral atrophy. Periventricular white matter low attenuation likely secondary to microangiopathy. Vascular: Cerebrovascular atherosclerotic calcifications are noted. Skull: Negative for fracture or focal lesion. Sinuses/Orbits: Visualized portions of the orbits are unremarkable. Visualized portions of the paranasal sinuses and mastoid air cells are unremarkable. Other: Scalp contusion along the left vertex. CT CERVICAL SPINE FINDINGS Alignment: Normal. Skull base and vertebrae: No acute fracture. No primary bone lesion or focal pathologic process. Erosion along the anterior aspect of the dens as can be seen with crystalline arthropathy. Soft tissues and spinal canal: No prevertebral fluid or swelling. No visible canal hematoma. Disc levels: Degenerative disc disease with disc height loss at C4-5 and C5-6. Right uncovertebral degenerative changes at C4-5 and C5-6 resulting in foraminal stenosis. Mild left foraminal stenosis at C4-5. Upper chest: Left apical scarring. Other: Bilateral carotid artery atherosclerosis. IMPRESSION: 1. No acute intracranial pathology. 2.  No acute osseous injury of the cervical spine. Electronically Signed   By: Kathreen Devoid   On: 05/30/2019 07:33   Dg Chest Portable 1 View  Result Date: 05/30/2019 CLINICAL DATA:  Altered mental status EXAM: PORTABLE CHEST 1 VIEW COMPARISON:  05/16/2019  FINDINGS: The heart size and mediastinal contours are within normal limits. Both lungs are clear. Old left posterior rib fractures. Surgical clips in the left axilla. IMPRESSION: No active disease. Electronically Signed   By: Kathreen Devoid   On: 05/30/2019 06:57   Dg Shoulder Left  Result Date: 05/16/2019 CLINICAL DATA:  Altered mental status, left-sided deficits, left shoulder pain EXAM: LEFT SHOULDER - 2+ VIEW COMPARISON:  None. FINDINGS: No evidence of fracture or traumatic malalignment. Degenerative changes of the acromioclavicular and lesser changes at the glenohumeral joint. Coracoclavicular and acromioclavicular intervals are maintained. Postsurgical changes are noted in the left axilla as well as postthoracotomy changes prior rib resection and several remote deformities of the left-sided ribs as well as suture material within the left lung. The aorta is calcified. IMPRESSION: 1. No acute osseous abnormality. 2. Mild acromioclavicular and glenohumeral degenerative changes. 3. Post thoracotomy changes left chest wall. Postsurgical changes left axilla. Electronically Signed   By: Lovena Le M.D.   On: 05/16/2019 16:13    Time Spent in minutes 25   Joevanni Roddey M.D on 06/05/2019 at 11:35 AM  Between 7am to 7pm - Pager - [phone]  After 7pm go to [email or web address] - password [facility]  [facility] -  Office  [phone]

## 2019-06-05 NOTE — Progress Notes (Signed)
PT is refusing to drink 2nd dose of MoviPrep. TR[facility] notified of same. Placed mixed MoviPrep at bedside, will encourage pt to drink.

## 2019-06-05 NOTE — Progress Notes (Signed)
Physical Therapy Treatment Patient Details Name: Samantha Mckenzie MRN: [id number] DOB: June 05, 1936 Today's Date: 06/05/2019    History of Present Illness Pt admit with increased abdominal pain, recent fall and weakness and low hemaglobin. [facility] and did not stay, however willing to stay this time. Last time she stated "they never came to great me or do anything with me so I left".    PT Comments    PT tolerated session today. Worked EOB for LE exercises. Ambulated to bathroom, no LOB with no RW for this short distance, held to my hand Min guard. Then ambulate in hallway with RW  For distance below, tolerated well, fatigued a bit, but most limiting was her fatigue and soreness in her neck., She states when she sits upright or showers her upper cervical region gets really sore. We discussed sitting up at EOB periodically for upper cervial strenghthening and tolerance. Then performing 10 x backward shoulder rolls.   Follow Up Recommendations  SNF(unless family able to help assist intially)     Equipment Recommendations  Other (comment)    Recommendations for Other Services       Precautions / Restrictions Restrictions Weight Bearing Restrictions: No    Mobility  Bed Mobility Overal bed mobility: Needs Assistance Bed Mobility: Supine to Sit;Sit to Supine     Supine to sit: Min guard Sit to supine: Min guard      Transfers Overall transfer level: Needs assistance Equipment used: Rolling walker (2 wheeled) Transfers: Sit to/from Stand Sit to Stand: Min guard            Ambulation/Gait Ambulation/Gait assistance: Supervision Gait Distance (Feet): 120 Feet Assistive device: Rolling walker (2 wheeled) Gait Pattern/deviations: Step-through pattern     General Gait Details: fairly steady with RW, wants to try without RW soon . The distance was about how far she could go. She fatigued fairly easy, however it was the first time out in the hall  walking.   Stairs             Wheelchair Mobility    Modified Rankin (Stroke Patients Only)       Balance                                            Cognition Arousal/Alertness: Awake/alert Behavior During Therapy: WFL for tasks assessed/performed Overall Cognitive Status: Within Functional Limits for tasks assessed                                        Exercises Other Exercises Other Exercises: seated EOB knee extesnion 10x ( hold 2 second in extension) B LE .    General Comments        Pertinent Vitals/Pain Pain Assessment: No/denies pain    Home Living                      Prior Function            PT Goals (current goals can now be found in the care plan section) Acute Rehab PT Goals Patient Stated Goal: I will do what I need to this time. PT Goal Formulation: With patient Time For Goal Achievement: 06/15/19 Potential to Achieve Goals: Good    Frequency    Min  3X/week(home versus SNF)      PT Plan Current plan remains appropriate    Co-evaluation              AM-PAC PT "6 Clicks" Mobility   Outcome Measure  Help needed turning from your back to your side while in a flat bed without using bedrails?: None Help needed moving from lying on your back to sitting on the side of a flat bed without using bedrails?: None Help needed moving to and from a bed to a chair (including a wheelchair)?: A Little Help needed standing up from a chair using your arms (e.g., wheelchair or bedside chair)?: A Little Help needed to walk in hospital room?: A Little Help needed climbing 3-5 steps with a railing? : A Little 6 Click Score: 20    End of Session Equipment Utilized During Treatment: Gait belt Activity Tolerance: Patient tolerated treatment well Patient left: in chair;with call bell/phone within reach Nurse Communication: Mobility status PT Visit Diagnosis: Unsteadiness on feet (R26.81);Repeated  falls (R29.6);Muscle weakness (generalized) (M62.81)     Time: 6644-0347 PT Time Calculation (min) (ACUTE ONLY): 21 min  Charges:  $Gait Training: 8-22 mins                     Clide Dales, PT Acute Rehabilitation Services Pager: [phone] Office: [phone] 06/05/2019    Clide Dales 06/05/2019, 6:50 PM

## 2019-06-06 ENCOUNTER — Encounter (HOSPITAL_COMMUNITY): Payer: Self-pay | Admitting: Anesthesiology

## 2019-06-06 ENCOUNTER — Inpatient Hospital Stay (HOSPITAL_COMMUNITY): Payer: Medicare Other | Admitting: Anesthesiology

## 2019-06-06 ENCOUNTER — Encounter (HOSPITAL_COMMUNITY)
Admission: EM | Disposition: A | Discharge status: Skilled Nursing Facility | Payer: Self-pay | Source: Home / Self Care | Attending: Internal Medicine

## 2019-06-06 DIAGNOSIS — K56699 Other intestinal obstruction unspecified as to partial versus complete obstruction: Secondary | ICD-10-CM

## 2019-06-06 DIAGNOSIS — R933 Abnormal findings on diagnostic imaging of other parts of digestive tract: Secondary | ICD-10-CM

## 2019-06-06 HISTORY — PX: COLONOSCOPY WITH PROPOFOL: SHX5780

## 2019-06-06 HISTORY — PX: BIOPSY: SHX5522

## 2019-06-06 LAB — CBC
HCT: 35.6 % — ABNORMAL LOW (ref 36.0–46.0)
Hemoglobin: 11.3 g/dL — ABNORMAL LOW (ref 12.0–15.0)
MCH: 30.9 pg (ref 26.0–34.0)
MCHC: 31.7 g/dL (ref 30.0–36.0)
MCV: 97.3 fL (ref 80.0–100.0)
Platelets: 371 10*3/uL (ref 150–400)
RBC: 3.66 MIL/uL — ABNORMAL LOW (ref 3.87–5.11)
RDW: 19.6 % — ABNORMAL HIGH (ref 11.5–15.5)
WBC: 6.2 10*3/uL (ref 4.0–10.5)
nRBC: 0 % (ref 0.0–0.2)

## 2019-06-06 LAB — QUANTIFERON-TB GOLD PLUS (RQFGPL)
QuantiFERON Mitogen Value: 0.04 IU/mL
QuantiFERON Nil Value: 0.02 IU/mL
QuantiFERON TB1 Ag Value: 0.01 IU/mL
QuantiFERON TB2 Ag Value: 0.02 IU/mL

## 2019-06-06 LAB — QUANTIFERON-TB GOLD PLUS: QuantiFERON-TB Gold Plus: UNDETERMINED — AB

## 2019-06-06 LAB — BASIC METABOLIC PANEL
Anion gap: 12 (ref 5–15)
BUN: 19 mg/dL (ref 8–23)
CO2: 20 mmol/L — ABNORMAL LOW (ref 22–32)
Calcium: 8.7 mg/dL — ABNORMAL LOW (ref 8.9–10.3)
Chloride: 114 mmol/L — ABNORMAL HIGH (ref 98–111)
Creatinine, Ser: 2.58 mg/dL — ABNORMAL HIGH (ref 0.44–1.00)
GFR calc Af Amer: 20 mL/min — ABNORMAL LOW (ref >60)
GFR calc non Af Amer: 17 mL/min — ABNORMAL LOW (ref >60)
Glucose, Bld: 155 mg/dL — ABNORMAL HIGH (ref 70–99)
Potassium: 4.1 mmol/L (ref 3.5–5.1)
Sodium: 146 mmol/L — ABNORMAL HIGH (ref 135–145)

## 2019-06-06 LAB — GLUCOSE, CAPILLARY
Glucose-Capillary: 127 mg/dL — ABNORMAL HIGH (ref 70–99)
Glucose-Capillary: 111 mg/dL — ABNORMAL HIGH (ref 70–99)
Glucose-Capillary: 105 mg/dL — ABNORMAL HIGH (ref 70–99)
Glucose-Capillary: 131 mg/dL — ABNORMAL HIGH (ref 70–99)

## 2019-06-06 SURGERY — COLONOSCOPY WITH PROPOFOL
Anesthesia: Monitor Anesthesia Care

## 2019-06-06 MED ORDER — LACTATED RINGERS IV BOLUS
1000.0000 mL | Freq: Once | INTRAVENOUS | Status: AC
  Administered 2019-06-06: 12:00:00 1000 mL via INTRAVENOUS

## 2019-06-06 MED ORDER — SODIUM CHLORIDE 0.45 % IV SOLN
INTRAVENOUS | Status: DC
  Administered 2019-06-06 – 2019-06-08 (×4): via INTRAVENOUS

## 2019-06-06 MED ORDER — ONDANSETRON HCL 4 MG/2ML IJ SOLN
INTRAMUSCULAR | Status: AC
  Filled 2019-06-06: qty 2

## 2019-06-06 MED ORDER — PROPOFOL 10 MG/ML IV BOLUS
INTRAVENOUS | Status: AC
  Filled 2019-06-06: qty 20

## 2019-06-06 MED ORDER — FENTANYL CITRATE (PF) 100 MCG/2ML IJ SOLN
25.0000 ug | Freq: Once | INTRAMUSCULAR | Status: AC
  Administered 2019-06-06: 25 ug via INTRAVENOUS
  Filled 2019-06-06: qty 2

## 2019-06-06 MED ORDER — SODIUM CHLORIDE 0.9 % IV SOLN: INTRAVENOUS | Status: DC

## 2019-06-06 MED ORDER — PROPOFOL 500 MG/50ML IV EMUL
INTRAVENOUS | Status: DC | PRN
  Administered 2019-06-06: 100 ug/kg/min via INTRAVENOUS

## 2019-06-06 MED ORDER — PROPOFOL 500 MG/50ML IV EMUL
INTRAVENOUS | Status: DC | PRN
  Administered 2019-06-06: 25 mg via INTRAVENOUS

## 2019-06-06 MED ORDER — PROCHLORPERAZINE EDISYLATE 10 MG/2ML IJ SOLN
10.0000 mg | Freq: Four times a day (QID) | INTRAMUSCULAR | Status: DC | PRN
  Administered 2019-06-06 (×3): 10 mg via INTRAVENOUS
  Filled 2019-06-06 (×3): qty 2

## 2019-06-06 MED ORDER — SODIUM CHLORIDE 0.9 % IV SOLN
INTRAVENOUS | Status: DC
  Administered 2019-06-06: 15:00:00 via INTRAVENOUS

## 2019-06-06 MED ORDER — LACTATED RINGERS IV SOLN: INTRAVENOUS | Status: DC | PRN

## 2019-06-06 MED ORDER — PROCHLORPERAZINE EDISYLATE 10 MG/2ML IJ SOLN: 10.0000 mg | Freq: Four times a day (QID) | INTRAMUSCULAR | Status: DC | PRN

## 2019-06-06 SURGICAL SUPPLY — 22 items

## 2019-06-06 NOTE — Progress Notes (Signed)
   06/06/19 0801  Vitals  Temp 99.2 F (37.3 C)  BP (!) 155/84  MAP (mmHg) 105  BP Location Right Arm  BP Method Automatic  Patient Position (if appropriate) Sitting  Pulse Rate (!) 124  Pulse Rate Source Monitor  Resp (!) 22  Oxygen Therapy  SpO2 100 %  O2 Device Room Air  MEWS Score  MEWS RR 1  MEWS Pulse 2  MEWS Systolic 0  MEWS LOC 0  MEWS Temp 0  MEWS Score 3  MEWS Score Color Yellow

## 2019-06-06 NOTE — Op Note (Signed)
[facility] Patient Name: Samantha Mckenzie Procedure Date: 06/06/2019 MRN: [id number] Attending MD: Ladene Artist , MD Date of Birth: [birth year] CSN: [id number] Age: 83 Admit Type: Inpatient Procedure:                Colonoscopy Indications:              Abnormal CT of the GI tract Providers:                Pricilla Riffle. Fuller Plan, MD, Elmer Ramp. Tilden Dome, RN, Cherylynn Ridges, Technician, [employer], CRNA Referring MD:             [facility] Medicines:                Monitored Anesthesia Care Complications:            No immediate complications. Estimated blood loss:                            None. Estimated Blood Loss:     Estimated blood loss: none. Procedure:                Pre-Anesthesia Assessment:                           - Prior to the procedure, a History and Physical                            was performed, and patient medications and                            allergies were reviewed. The patient's tolerance of                            previous anesthesia was also reviewed. The risks                            and benefits of the procedure and the sedation                            options and risks were discussed with the patient.                            All questions were answered, and informed consent                            was obtained. Prior Anticoagulants: The patient has                            taken no previous anticoagulant or antiplatelet                            agents. ASA Grade Assessment: II - A patient with  mild systemic disease. After reviewing the risks                            and benefits, the patient was deemed in                            satisfactory condition to undergo the procedure.                           After obtaining informed consent, the colonoscope                            was passed under direct vision. Throughout the   procedure, the patient's blood pressure, pulse, and                            oxygen saturations were monitored continuously. The                            PCF-H190DL ([id number] Olympus pediatric colonscope                            was introduced through the anus and advanced to the                            the terminal ileum, with identification of the                            appendiceal orifice and IC valve. The ileocecal                            valve, appendiceal orifice, and rectum were                            photographed. The quality of the bowel preparation                            was good. The colonoscopy was performed without                            difficulty. The patient tolerated the procedure                            well. Scope In: 2:55:39 PM Scope Out: 3:15:01 PM Scope Withdrawal Time: 0 hours 17 minutes 19 seconds  Total Procedure Duration: 0 hours 19 minutes 22 seconds  Findings:      The perianal and digital rectal examinations were normal.      The terminal ileum contained a benign-appearing, intrinsic moderate       stenosis at the IC valve measuring 4 mm (inner diameter) that was       non-traversed. Positioning was challenging with looping and unable to       fully intubate the TI. The very distal aspect of the TI was visualized.       Biopsies were taken with a cold forceps  for histology.      Multiple medium-mouthed diverticula were found in the left colon.      The exam was otherwise without abnormality on direct retroflexion views.       Unable to safely retroflex due to a narrow rectal vault. 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 Biopsied.                           - Diverticulosis in the left colon.                           - The examination was otherwise normal on direct                            and retroflexion views. Moderate Sedation:      Not Applicable - Patient had care per Anesthesia. Recommendation:            - Patient has a contact number available for                            emergencies. The signs and symptoms of potential                            delayed complications were discussed with the                            patient. Return to normal activities tomorrow.                            Written discharge instructions were provided to the                            patient.                           - Resume previous diet.                           - Continue present medications.                           - Await pathology results. Procedure Code(s):        --- Professional ---                           [phone], Colonoscopy, flexible; with biopsy, single                            or multiple Diagnosis Code(s):        --- Professional ---                           U13.244, Other intestinal obstruction unspecified                            as to partial versus complete  obstruction                           K57.30, Diverticulosis of large intestine without                            perforation or abscess without bleeding                           R93.3, Abnormal findings on diagnostic imaging of                            other parts of digestive tract CPT copyright 2019 [employer]. All rights reserved. The codes documented in this report are preliminary and upon coder review may  be revised to meet current compliance requirements. Ladene Artist, MD 06/06/2019 3:31:16 PM This report has been signed electronically. Number of Addenda: 0

## 2019-06-06 NOTE — Progress Notes (Signed)
   Vital Signs MEWS/VS Documentation      06/05/2019 2117 06/06/2019 0221 06/06/2019 0548 06/06/2019 0801   MEWS Score:  1  1  2  3    MEWS Score Color:  Green  Green  Yellow  Yellow   Resp:  19  -  20  (!) 22   Pulse:  (!) 105  -  (!) 113  (!) 124   BP:  138/78  -  (!) 154/75  (!) 155/84   Temp:  98.3 F (36.8 C)  -  98.7 F (37.1 C)  99.2 F (37.3 C)   O2 Device:  Room Air  -  Room Air  Room Air   Level of Consciousness:  -  Alert  -  -           Samantha Mckenzie 06/06/2019,8:08 AM

## 2019-06-06 NOTE — Interval H&P Note (Signed)
History and Physical Interval Note:  06/06/2019 2:43 PM  Samantha Mckenzie  has presented today for surgery, with the diagnosis of terminal ileitis.  The various methods of treatment have been discussed with the patient and family. After consideration of risks, benefits and other options for treatment, the patient has consented to  Procedure(s): COLONOSCOPY WITH PROPOFOL (N/A) as a surgical intervention.  The patient's history has been reviewed, patient examined, no change in status, stable for surgery.  I have reviewed the patient's chart and labs.  Questions were answered to the patient's satisfaction.     Pricilla Riffle. Fuller Plan

## 2019-06-06 NOTE — Progress Notes (Signed)
MD alerted of yellow MEWS will repeat vitals and MD stated he will come assess patient. Will continue to monitor.  Birdie Hopes, RN

## 2019-06-06 NOTE — Progress Notes (Addendum)
Patient ID: Samantha Mckenzie, female   DOB: [birth year], 83 y.o.   MRN: [id number]    Progress Note   Subjective  Day #8 CC diarrhea, weakness, terminal ileitis  Very nauseated this morning and had been vomiting earlier.  She did complete her prep apparently vomited some of it back up last night.  Stools watery and clear.  Tachycardic this morning-pulse 120 -has not been on any IV fluids  Creatinine up to 2.5   Objective   Vital signs in last 24 hours: Temp:  [98.3 F (36.8 C)-99.2 F (37.3 C)] 99.2 F (37.3 C) (08/26 0801) Pulse Rate:  [102-124] 124 (08/26 0801) Resp:  [18-22] 22 (08/26 0801) BP: (136-155)/(66-84) 155/84 (08/26 0801) SpO2:  [97 %-100 %] 100 % (08/26 0801) Weight:  [50.1 kg] 50.1 kg (08/26 0548) Last BM Date: 06/05/19 General:    Elderly female in NAD moaning secondary to nausea Heart: Tachy regular rate and rhythm; no murmurs Lungs: Respirations even and unlabored, lungs CTA bilaterally Abdomen:  Soft, nontender and nondistended. Normal bowel sounds. Extremities:  Without edema. Neurologic:  Alert and oriented,  grossly normal neurologically. Psych:  Cooperative. Normal mood and affect.  Intake/Output from previous day: 08/25 0701 - 08/26 0700 In: 0  Out: 400 [Urine:400] Intake/Output this shift: No intake/output data recorded.  Lab Results: Recent Labs    06/04/19 0824 06/06/19 0536  WBC 6.3 6.2  HGB 10.5* 11.3*  HCT 32.2* 35.6*  PLT 332 371   BMET Recent Labs    06/04/19 1013 06/05/19 0557 06/06/19 0536  NA 140 141 146*  K 3.6 3.4* 4.1  CL 105 112* 114*  CO2 26 23 20*  GLUCOSE 121* 112* 155*  BUN 21 16 19   CREATININE 2.82* 2.25* 2.58*  CALCIUM 8.5* 7.7* 8.7*   LFT No results for input(s): PROT, ALBUMIN, AST, ALT, ALKPHOS, BILITOT, BILIDIR, IBILI in the last 72 hours. PT/INR No results for input(s): LABPROT, INR in the last 72 hours.      Assessment / Plan:    #62 83 year old female with persistent watery stools over several  weeks admitted with weakness nausea abdominal pain and lack of appetite. CT imaging very concerning for terminal ileitis with a long segment of involvement.  Sed rate has been elevated, very suspicious for Crohn's disease.  Patient and family had previously been reluctant to proceed with colonoscopy.  Patient decided yesterday she did want to proceed with colonoscopy. She had a difficult time with the prep and is nauseated and has had some vomiting but was able to complete it.  QuantiFERON gold negative GI path panel never collected- will DC enteric precautions  #2 AKI, on chronic kidney disease-creatinine has bumped back up.  I started IV fluids back at 100/h  #3 recent altered mental status-resolved and mentating well- #4 depression-being treated  Plan; volume repletion Continue Compazine 10 mg every 6 hours as needed for nausea  Proceed with colonoscopy with intubation of TI this afternoon.  If findings consistent with Crohn's disease hopefully we can go ahead and start on steroids while waiting for path.  Principal Problem:   Depression Active Problems:   Normocytic anemia   Essential hypertension   Generalized weakness   Nausea vomiting and diarrhea   Acute renal failure superimposed on stage 4 chronic kidney disease (HCC)   Lactic acidosis   Hypokalemia   Terminal ileitis with complication (HCC)   Ileitis   Dehydration   Acute blood loss anemia    LOS: 7 days  Amy EsterwoodPA-C  06/06/2019, 8:45 AM      Attending Physician Note   I have taken an interval history, reviewed the chart and examined the patient. I agree with the Advanced Practitioner's note, impression and recommendations.   Lucio Edward, MD [facility]

## 2019-06-06 NOTE — Transfer of Care (Signed)
Immediate Anesthesia Transfer of Care Note  Patient: Samantha Mckenzie  Procedure(s) Performed: COLONOSCOPY WITH PROPOFOL (N/A )  Patient Location: PACU and Endoscopy Unit  Anesthesia Type:MAC  Level of Consciousness: awake, alert  and oriented  Airway & Oxygen Therapy: Patient Spontanous Breathing  Post-op Assessment: pt stable  Post vital signs: Reviewed and stable  Last Vitals:  Vitals Value Taken Time  BP    Temp    Pulse    Resp    SpO2      Last Pain:  Vitals:   06/06/19 1323  TempSrc:   PainSc: 0-No pain         Complications: No apparent anesthesia complications

## 2019-06-06 NOTE — Anesthesia Preprocedure Evaluation (Addendum)
Anesthesia Evaluation  Patient identified by MRN, date of birth, ID band Patient awake    Reviewed: Allergy & Precautions, NPO status , Patient's Chart, lab work & pertinent test results  Airway Mallampati: II  TM Distance: >3 FB Neck ROM: Full    Dental  (+) Teeth Intact, Dental Advisory Given   Pulmonary COPD,    breath sounds clear to auscultation       Cardiovascular hypertension, Pt. on medications and Pt. on home beta blockers +CHF   Rhythm:Regular Rate:Normal     Neuro/Psych Depression    GI/Hepatic Neg liver ROS, GERD  Medicated,  Endo/Other  diabetes  Renal/GU CRFRenal disease     Musculoskeletal  (+) Arthritis ,   Abdominal Normal abdominal exam  (+)   Peds  Hematology negative hematology ROS (+)   Anesthesia Other Findings   Reproductive/Obstetrics                            Anesthesia Physical Anesthesia Plan  ASA: III  Anesthesia Plan: MAC   Post-op Pain Management:    Induction: Intravenous  PONV Risk Score and Plan: Propofol infusion and Ondansetron  Airway Management Planned: Natural Airway and Simple Face Mask  Additional Equipment: None  Intra-op Plan:   Post-operative Plan:   Informed Consent: I have reviewed the patients History and Physical, chart, labs and discussed the procedure including the risks, benefits and alternatives for the proposed anesthesia with the patient or authorized representative who has indicated his/her understanding and acceptance.     Consent reviewed with POA  Plan Discussed with: CRNA  Anesthesia Plan Comments:        Anesthesia Quick Evaluation

## 2019-06-06 NOTE — Progress Notes (Signed)
PROGRESS NOTE    Samantha Mckenzie  FIE:[id number] DOB: [birth year] DOA: 05/30/2019 PCP: Colon Branch, MD   Brief Narrative:  83 year old female with history of GERD, prior history of focal enteritis versus?  IBD, type 2 diabetes mellitus with peripheral neuropathy, COPD, chronic diastolic CHF, breast cancer status post left mastectomy was brought to the ED from home with diffuse abdominal pain, nausea, vomiting and diarrhea.  Also reported frank blood in stool, poor p.o. intake. In the ED she was found to be in early sepsis with tachycardia, tachypnea, hypertensive, AKI and hypokalemia.  Also had elevated lactic acid of 2.9.  CT abdomen pelvis without contrast showed findings of terminal ileitis.  Admitted to hospital service on empiric antibiotics.   Assessment & Plan:   Principal Problem:   Depression Active Problems:   Normocytic anemia   Essential hypertension   Generalized weakness   Nausea vomiting and diarrhea   Acute renal failure superimposed on stage 4 chronic kidney disease (HCC)   Lactic acidosis   Hypokalemia   Terminal ileitis with complication (HCC)   Ileitis   Dehydration   Acute blood loss anemia   Abnormal CT scan, small bowel  Sepsis with terminal ileitis, infectious versus inflammatory. Sepsis resolved.  Antibiotics discontinued as it is not helping her with her symptoms.  (received for 5 days) Colonoscopy with stricture in terminal ileum - follow bx results GI following.  Concern for crohn's with elevated sed rate.  Also consider  gastrointestinal TB versus lymphoma.  TB QuantiFERON sent on 8/24,  results pending.   Stool for C. difficile negative.  GI pathogen panel d/c'd.   Sinus Tachycardia: EKG with sinus tach.  No CP.  Suspect related to prep and hypovolemia.  Bolus and continue IVF.  Continue to monitor.  Acute blood loss anemia Normocytic anemia with baseline hemoglobin 8.5.  Dropped to 6.8, improved with 1 unit PRBC.  Improved, follow.   Acute on  chronic kidney disease stage III Suspect dehydration due to GI loss.  Bolus and follow IVF.  Creatinine up today.  Hypokalemia Replenished.  Magnesium normal.  Failure to thrive and poor p.o. intake PT and nutrition consulted.  Recommend SNF  Type 2 diabetes mellitus, controlled A1c of 5.2.  Monitor on sliding scale coverage  ?  Depression and frontotemporal dementia. PCP had concern brought up by family that patient was not adherent to treatment regimen.  She tells me that she was having frequent dizzy spells and and headache and feeling depressed for past several weeks.  Reported feeling worthless but denied any suicidal ideation.  She is on Lexapro and dose was recently increased by her PCP.   Psych consult appreciated. Agrees with increased dose of lexapro. QTc normal.  DVT prophylaxis: SCD Code Status: dnr Family Communication: none at bedside Disposition Plan: pending further improvement   Consultants:   GI  Procedures: Colonoscopy - Stricture in the terminal ileum. Biopsied. - Diverticulosis in the left colon. - The examination was otherwise normal on direct and retroflexion views. Impression: Not Applicable - Patient had care per Anesthesia. Moderate Sedation: - Patient has Juletta Berhe contact number available for emergencies. The signs and symptoms of potential delayed complications were discussed with the patient. Return to normal activities tomorrow. Written discharge instructions were provided to the patient. - Resume previous diet. - Continue present medications. - Await pathology results.  Antimicrobials: Anti-infectives (From admission, onward)   Start     Dose/Rate Route Frequency Ordered Stop   05/30/19 1400  piperacillin-tazobactam (  ZOSYN) IVPB 2.25 g  Status:  Discontinued     2.25 g 100 mL/hr over 30 Minutes Intravenous Every 8 hours 05/30/19 1343 06/04/19 0858     Subjective: Feels generally poorly No CP.    Objective: Vitals:   06/06/19 1522  06/06/19 1525 06/06/19 1530 06/06/19 1540  BP: (!) 203/55 (!) 195/73  (!) 183/57  Pulse: (!) 108 (!) 109 (!) 112 (!) 115  Resp: 20 15 14 20   Temp: 97.8 F (36.6 C)     TempSrc: Oral     SpO2:  100% 100% 100%  Weight:      Height:        Intake/Output Summary (Last 24 hours) at 06/06/2019 1928 Last data filed at 06/06/2019 1846 Gross per 24 hour  Intake 359 ml  Output -  Net 359 ml   Filed Weights   06/04/19 0502 06/05/19 0500 06/06/19 0548  Weight: 50.5 kg 52 kg 50.1 kg    Examination:  General exam: Appears calm and comfortable  Respiratory system: Clear to auscultation. Respiratory effort normal. Cardiovascular system: S1 & S2 heard, RRR.  Gastrointestinal system: Abdomen is nondistended, soft and nontender.  Central nervous system: Alert and oriented x3. No focal neurological deficits. Extremities: no LEE Skin: No rashes, lesions or ulcers Psychiatry: Judgement and insight appear normal. Mood & affect appropriate.     Data Reviewed: I have personally reviewed following labs and imaging studies  CBC: Recent Labs  Lab 05/31/19 0717  06/01/19 0502 06/02/19 0530 06/03/19 0844 06/04/19 0824 06/06/19 0536  WBC 6.1  --  5.8 5.5 4.9 6.3 6.2  NEUTROABS 4.0  --   --   --   --   --   --   HGB 6.8*   < > 9.3* 9.7* 11.4* 10.5* 11.3*  HCT 21.4*   < > 29.1* 30.0* 36.5 32.2* 35.6*  MCV 97.3  --  94.8 95.5 97.1 94.2 97.3  PLT 366  --  334 333 382 332 371   < > = values in this interval not displayed.   Basic Metabolic Panel: Recent Labs  Lab 05/31/19 0717 06/01/19 0502 06/02/19 0530 06/04/19 1013 06/05/19 0557 06/06/19 0536  NA 139 142 144 140 141 146*  K 2.9* 4.3 3.9 3.6 3.4* 4.1  CL 97* 107 104 105 112* 114*  CO2 32 27 28 26 23  20*  GLUCOSE 90 87 86 121* 112* 155*  BUN 27* 19 19 21 16 19   CREATININE 2.14* 2.15* 2.53* 2.82* 2.25* 2.58*  CALCIUM 7.0* 7.7* 8.2* 8.5* 7.7* 8.7*  MG 1.9  --   --   --   --   --    GFR: Estimated Creatinine Clearance: 13.8 mL/min  (Samantha Mckenzie) (by C-G formula based on SCr of 2.58 mg/dL (H)). Liver Function Tests: Recent Labs  Lab 05/31/19 0717  AST 13*  ALT 11  ALKPHOS 62  BILITOT 0.5  PROT 5.6*  ALBUMIN 2.1*   Recent Labs  Lab 05/31/19 0717  LIPASE 53*   No results for input(s): AMMONIA in the last 168 hours. Coagulation Profile: No results for input(s): INR, PROTIME in the last 168 hours. Cardiac Enzymes: No results for input(s): CKTOTAL, CKMB, CKMBINDEX, TROPONINI in the last 168 hours. BNP (last 3 results) No results for input(s): PROBNP in the last 8760 hours. HbA1C: No results for input(s): HGBA1C in the last 72 hours. CBG: Recent Labs  Lab 06/05/19 1549 06/05/19 2113 06/06/19 0803 06/06/19 1159 06/06/19 1632  GLUCAP 101* 127* 127* 111*  105*   Lipid Profile: No results for input(s): CHOL, HDL, LDLCALC, TRIG, CHOLHDL, LDLDIRECT in the last 72 hours. Thyroid Function Tests: No results for input(s): TSH, T4TOTAL, FREET4, T3FREE, THYROIDAB in the last 72 hours. Anemia Panel: No results for input(s): VITAMINB12, FOLATE, FERRITIN, TIBC, IRON, RETICCTPCT in the last 72 hours. Sepsis Labs: No results for input(s): PROCALCITON, LATICACIDVEN in the last 168 hours.  Recent Results (from the past 240 hour(s))  Blood culture (routine x 2)     Status: None   Collection Time: 05/30/19  6:16 AM   Specimen: BLOOD  Result Value Ref Range Status   Specimen Description   Final    BLOOD BLOOD RIGHT FOREARM Performed at [facility] [address] [place], [place] [address]    Special Requests   Final    BOTTLES DRAWN AEROBIC ONLY Blood Culture adequate volume Performed at [facility] [address], [place], [place] [address]    Culture   Final    NO GROWTH 5 DAYS Performed at [facility], [place] [address], [place], [place] [address]    Report Status 06/04/2019 FINAL  Final  Blood culture (routine x 2)     Status: None   Collection Time: 05/30/19  6:44 AM    Specimen: BLOOD  Result Value Ref Range Status   Specimen Description   Final    BLOOD LEFT ANTECUBITAL Performed at [facility] [address], [place], [place] [address]    Special Requests   Final    BOTTLES DRAWN AEROBIC AND ANAEROBIC Blood Culture results may not be optimal due to an excessive volume of blood received in culture bottles Performed at [facility] [address], [place], [place] [address]    Culture   Final    NO GROWTH 5 DAYS Performed at [facility], [place] [address] [place], [place] [address]    Report Status 06/04/2019 FINAL  Final  SARS Coronavirus 2 [facility] order, Performed in [facility] hospital lab) Nasopharyngeal Nasopharyngeal Swab     Status: None   Collection Time: 05/30/19 12:16 PM   Specimen: Nasopharyngeal Swab  Result Value Ref Range Status   SARS Coronavirus 2 NEGATIVE NEGATIVE Final    Comment: (NOTE) If result is NEGATIVE SARS-CoV-2 target nucleic acids are NOT DETECTED. The SARS-CoV-2 RNA is generally detectable in upper and lower  respiratory specimens during the acute phase of infection. The lowest  concentration of SARS-CoV-2 viral copies this assay can detect is 250  copies / mL. Kirk Sampley negative result does not preclude SARS-CoV-2 infection  and should not be used as the sole basis for treatment or other  patient management decisions.  Luvada Salamone negative result may occur with  improper specimen collection / handling, submission of specimen other  than nasopharyngeal swab, presence of viral mutation(s) within the  areas targeted by this assay, and inadequate number of viral copies  (<250 copies / mL). Mikalia Fessel negative result must be combined with clinical  observations, patient history, and epidemiological information. If result is POSITIVE SARS-CoV-2 target nucleic acids are DETECTED. The SARS-CoV-2 RNA is generally detectable in upper and lower  respiratory specimens dur ing the acute phase of  infection.  Positive  results are indicative of active infection with SARS-CoV-2.  Clinical  correlation with patient history and other diagnostic information is  necessary to determine patient infection status.  Positive results do  not rule out bacterial infection or co-infection with other  viruses. If result is PRESUMPTIVE POSTIVE SARS-CoV-2 nucleic acids MAY BE PRESENT.   Oluwatomisin Deman presumptive positive result was obtained on the submitted specimen  and confirmed on repeat testing.  While 2019 novel coronavirus  (SARS-CoV-2) nucleic acids may be present in the submitted sample  additional confirmatory testing may be necessary for epidemiological  and / or clinical management purposes  to differentiate between  SARS-CoV-2 and other Sarbecovirus currently known to infect humans.  If clinically indicated additional testing with an alternate test  methodology [phone]) is advised. The SARS-CoV-2 RNA is generally  detectable in upper and lower respiratory sp ecimens during the acute  phase of infection. The expected result is Negative. Fact Sheet for Patients:  StrictlyIdeas.no Fact Sheet for Healthcare Providers: BankingDealers.co.za This test is not yet approved or cleared by the [place] FDA and has been authorized for detection and/or diagnosis of SARS-CoV-2 by FDA under an Emergency Use Authorization (EUA).  This EUA will remain in effect (meaning this test can be used) for the duration of the COVID-19 declaration under Section 564(b)(1) of the Act, 21 U.S.C. section 360bbb-3(b)(1), unless the authorization is terminated or revoked sooner. Performed at [facility], [place] [address], [place], [place] [address]   C difficile quick scan w PCR reflex     Status: None   Collection Time: 06/03/19 12:14 PM   Specimen: STOOL  Result Value Ref Range Status   C Diff antigen NEGATIVE NEGATIVE Final   C Diff toxin NEGATIVE  NEGATIVE Final   C Diff interpretation No C. difficile detected.  Final    Comment: Performed at [facility], [place] [address], [place], [place] [address]  Novel Coronavirus, NAA (hospital order; send-out to ref lab)     Status: None   Collection Time: 06/04/19  6:24 PM   Specimen: Nasopharyngeal Swab; Respiratory  Result Value Ref Range Status   SARS-CoV-2, NAA NOT DETECTED NOT DETECTED Final    Comment: (NOTE) This test was developed and its performance characteristics determined by [employer]. This test has not been FDA cleared or approved. This test has been authorized by FDA under an Emergency Use Authorization (EUA). This test is only authorized for the duration of time the declaration that circumstances exist justifying the authorization of the emergency use of in vitro diagnostic tests for detection of SARS-CoV-2 virus and/or diagnosis of COVID-19 infection under section 564(b)(1) of the Act, 21 U.S.C. 607PXT-0(G)(2), unless the authorization is terminated or revoked sooner. When diagnostic testing is negative, the possibility of Saskia Simerson false negative result should be considered in the context of Jamal Haskin patient's recent exposures and the presence of clinical signs and symptoms consistent with COVID-19. An individual without symptoms of COVID-19 and who is not shedding SARS-CoV-2 virus would expect to have Leylany Nored negative (not detected) result in this assay. Performed  At: [facility] [place], [place] [id number] Rush Farmer MD OJ:[id number]    [place]  Final    Comment: Performed at [facility] [address] [place], [place] [address]         Radiology Studies: No results found.      Scheduled Meds: . amLODipine  10 mg Oral Daily  . atorvastatin  20 mg Oral Daily  . cholecalciferol  1,000 Units Oral Daily  . escitalopram  20 mg Oral Daily  . ferrous sulfate  325 mg Oral Q  breakfast  . insulin aspart  0-5 Units Subcutaneous QHS  . insulin  aspart  0-9 Units Subcutaneous TID WC  . mirtazapine  15 mg Oral QHS  . pantoprazole  40 mg Oral Daily  . vitamin B-12  1,000 mcg Oral Daily   Continuous Infusions: . sodium chloride 100 mL/hr at 06/06/19 1011     LOS: 7 days    Time spent: over 30 min    Fayrene Helper, MD [facility] Pager AMION  If 7PM-7AM, please contact night-coverage [email or web address] Password [facility] 06/06/2019, 7:28 PM

## 2019-06-06 NOTE — Progress Notes (Signed)
PT Cancellation Note  Patient Details Name: TACHE BOBST MRN: [id number] DOB: 09-30-1936   Cancelled Treatment:    Reason Eval/Treat Not Completed: Patient at procedure or test/unavailable(pt at colonoscopy, will follow)  Philomena Doheny PT 06/06/2019  Acute Rehabilitation Services Pager [phone] Office [phone]

## 2019-06-06 NOTE — H&P (View-Only) (Signed)
Patient ID: Samantha Mckenzie, female   DOB: Feb 19, 1936, 83 y.o.   MRN: [id number]    Progress Note   Subjective  Day #8 CC diarrhea, weakness, terminal ileitis  Very nauseated this morning and had been vomiting earlier.  She did complete her prep apparently vomited some of it back up last night.  Stools watery and clear.  Tachycardic this morning-pulse 120 -has not been on any IV fluids  Creatinine up to 2.5   Objective   Vital signs in last 24 hours: Temp:  [98.3 F (36.8 C)-99.2 F (37.3 C)] 99.2 F (37.3 C) (08/26 0801) Pulse Rate:  [102-124] 124 (08/26 0801) Resp:  [18-22] 22 (08/26 0801) BP: (136-155)/(66-84) 155/84 (08/26 0801) SpO2:  [97 %-100 %] 100 % (08/26 0801) Weight:  [50.1 kg] 50.1 kg (08/26 0548) Last BM Date: 06/05/19 General:    Elderly female in NAD moaning secondary to nausea Heart: Tachy regular rate and rhythm; no murmurs Lungs: Respirations even and unlabored, lungs CTA bilaterally Abdomen:  Soft, nontender and nondistended. Normal bowel sounds. Extremities:  Without edema. Neurologic:  Alert and oriented,  grossly normal neurologically. Psych:  Cooperative. Normal mood and affect.  Intake/Output from previous day: 08/25 0701 - 08/26 0700 In: 0  Out: 400 [Urine:400] Intake/Output this shift: No intake/output data recorded.  Lab Results: Recent Labs    06/04/19 0824 06/06/19 0536  WBC 6.3 6.2  HGB 10.5* 11.3*  HCT 32.2* 35.6*  PLT 332 371   BMET Recent Labs    06/04/19 1013 06/05/19 0557 06/06/19 0536  NA 140 141 146*  K 3.6 3.4* 4.1  CL 105 112* 114*  CO2 26 23 20*  GLUCOSE 121* 112* 155*  BUN 21 16 19   CREATININE 2.82* 2.25* 2.58*  CALCIUM 8.5* 7.7* 8.7*   LFT No results for input(s): PROT, ALBUMIN, AST, ALT, ALKPHOS, BILITOT, BILIDIR, IBILI in the last 72 hours. PT/INR No results for input(s): LABPROT, INR in the last 72 hours.      Assessment / Plan:    #59 83 year old female with persistent watery stools over several  weeks admitted with weakness nausea abdominal pain and lack of appetite. CT imaging very concerning for terminal ileitis with a long segment of involvement.  Sed rate has been elevated, very suspicious for Crohn's disease.  Patient and family had previously been reluctant to proceed with colonoscopy.  Patient decided yesterday she did want to proceed with colonoscopy. She had a difficult time with the prep and is nauseated and has had some vomiting but was able to complete it.  QuantiFERON gold negative GI path panel never collected- will DC enteric precautions  #2 AKI, on chronic kidney disease-creatinine has bumped back up.  I started IV fluids back at 100/h  #3 recent altered mental status-resolved and mentating well- #4 depression-being treated  Plan; volume repletion Continue Compazine 10 mg every 6 hours as needed for nausea  Proceed with colonoscopy with intubation of TI this afternoon.  If findings consistent with Crohn's disease hopefully we can go ahead and start on steroids while waiting for path.  Principal Problem:   Depression Active Problems:   Normocytic anemia   Essential hypertension   Generalized weakness   Nausea vomiting and diarrhea   Acute renal failure superimposed on stage 4 chronic kidney disease (HCC)   Lactic acidosis   Hypokalemia   Terminal ileitis with complication (HCC)   Ileitis   Dehydration   Acute blood loss anemia    LOS: 7 days  Amy EsterwoodPA-C  06/06/2019, 8:45 AM      Attending Physician Note   I have taken an interval history, reviewed the chart and examined the patient. I agree with the Advanced Practitioner's note, impression and recommendations.   Lucio Edward, MD [facility]

## 2019-06-07 ENCOUNTER — Encounter (HOSPITAL_COMMUNITY): Payer: Self-pay | Admitting: Gastroenterology

## 2019-06-07 LAB — COMPREHENSIVE METABOLIC PANEL
ALT: 8 U/L (ref 0–44)
AST: 13 U/L — ABNORMAL LOW (ref 15–41)
Albumin: 2.1 g/dL — ABNORMAL LOW (ref 3.5–5.0)
Alkaline Phosphatase: 52 U/L (ref 38–126)
Anion gap: 11 (ref 5–15)
BUN: 16 mg/dL (ref 8–23)
CO2: 22 mmol/L (ref 22–32)
Calcium: 7.9 mg/dL — ABNORMAL LOW (ref 8.9–10.3)
Chloride: 107 mmol/L (ref 98–111)
Creatinine, Ser: 2.21 mg/dL — ABNORMAL HIGH (ref 0.44–1.00)
GFR calc Af Amer: 24 mL/min — ABNORMAL LOW (ref >60)
GFR calc non Af Amer: 20 mL/min — ABNORMAL LOW (ref >60)
Glucose, Bld: 88 mg/dL (ref 70–99)
Potassium: 2.9 mmol/L — ABNORMAL LOW (ref 3.5–5.1)
Sodium: 140 mmol/L (ref 135–145)
Total Bilirubin: 0.4 mg/dL (ref 0.3–1.2)
Total Protein: 5.3 g/dL — ABNORMAL LOW (ref 6.5–8.1)

## 2019-06-07 LAB — CBC
HCT: 28.2 % — ABNORMAL LOW (ref 36.0–46.0)
Hemoglobin: 8.8 g/dL — ABNORMAL LOW (ref 12.0–15.0)
MCH: 30.4 pg (ref 26.0–34.0)
MCHC: 31.2 g/dL (ref 30.0–36.0)
MCV: 97.6 fL (ref 80.0–100.0)
Platelets: 280 10*3/uL (ref 150–400)
RBC: 2.89 MIL/uL — ABNORMAL LOW (ref 3.87–5.11)
RDW: 20 % — ABNORMAL HIGH (ref 11.5–15.5)
WBC: 7.8 10*3/uL (ref 4.0–10.5)
nRBC: 0 % (ref 0.0–0.2)

## 2019-06-07 LAB — GLUCOSE, CAPILLARY
Glucose-Capillary: 88 mg/dL (ref 70–99)
Glucose-Capillary: 94 mg/dL (ref 70–99)
Glucose-Capillary: 121 mg/dL — ABNORMAL HIGH (ref 70–99)
Glucose-Capillary: 134 mg/dL — ABNORMAL HIGH (ref 70–99)

## 2019-06-07 LAB — MAGNESIUM: Magnesium: 1.3 mg/dL — ABNORMAL LOW (ref 1.7–2.4)

## 2019-06-07 MED ORDER — MAGNESIUM SULFATE 4 GM/100ML IV SOLN: 4.0000 g | Freq: Once | INTRAVENOUS | Status: DC

## 2019-06-07 MED ORDER — POTASSIUM CHLORIDE CRYS ER 20 MEQ PO TBCR
40.0000 meq | EXTENDED_RELEASE_TABLET | ORAL | Status: AC
  Administered 2019-06-07 (×2): 40 meq via ORAL
  Filled 2019-06-07 (×2): qty 2

## 2019-06-07 MED ORDER — METHYLPREDNISOLONE SODIUM SUCC 40 MG IJ SOLR
40.0000 mg | Freq: Every day | INTRAMUSCULAR | Status: DC
  Administered 2019-06-07 – 2019-06-09 (×3): 40 mg via INTRAVENOUS
  Filled 2019-06-07 (×3): qty 1

## 2019-06-07 MED ORDER — MAGNESIUM SULFATE 2 GM/50ML IV SOLN
2.0000 g | Freq: Once | INTRAVENOUS | Status: AC
  Administered 2019-06-07: 2 g via INTRAVENOUS
  Filled 2019-06-07: qty 50

## 2019-06-07 NOTE — Anesthesia Postprocedure Evaluation (Signed)
Anesthesia Post Note  Patient: Samantha Mckenzie  Procedure(s) Performed: COLONOSCOPY WITH PROPOFOL (N/A )     Patient location during evaluation: PACU Anesthesia Type: MAC Level of consciousness: awake and alert Pain management: pain level controlled Vital Signs Assessment: post-procedure vital signs reviewed and stable Respiratory status: spontaneous breathing, nonlabored ventilation, respiratory function stable and patient connected to nasal cannula oxygen Cardiovascular status: stable and blood pressure returned to baseline Postop Assessment: no apparent nausea or vomiting Anesthetic complications: no    Last Vitals:  Vitals:   06/07/19 0106 06/07/19 0513  BP: (!) 132/57 (!) 142/66  Pulse: 90 85  Resp: 16 16  Temp: 37.1 C 37 C  SpO2: 99% 98%    Last Pain:  Vitals:   06/06/19 1952  TempSrc:   PainSc: 0-No pain                 Effie Berkshire

## 2019-06-07 NOTE — Progress Notes (Addendum)
PROGRESS NOTE    Samantha Mckenzie  KVQ:[id number] DOB: [birth year] DOA: 05/30/2019 PCP: Colon Branch, MD   Brief Narrative:  83 year old female with history of GERD, prior history of focal enteritis versus?  IBD, type 2 diabetes mellitus with peripheral neuropathy, COPD, chronic diastolic CHF, breast cancer status post left mastectomy was brought to the ED from home with diffuse abdominal pain, nausea, vomiting and diarrhea.  Also reported frank blood in stool, poor p.o. intake. In the ED she was found to be in early sepsis with tachycardia, tachypnea, hypertensive, AKI and hypokalemia.  Also had elevated lactic acid of 2.9.  CT abdomen pelvis without contrast showed findings of terminal ileitis.  Admitted to hospital service on empiric antibiotics.   Assessment & Plan:   Principal Problem:   Depression Active Problems:   Normocytic anemia   Essential hypertension   Generalized weakness   Nausea vomiting and diarrhea   Acute renal failure superimposed on stage 4 chronic kidney disease (HCC)   Lactic acidosis   Hypokalemia   Terminal ileitis with complication (HCC)   Ileitis   Dehydration   Acute blood loss anemia   Abnormal CT scan, small bowel  Sepsis with terminal ileitis, infectious versus inflammatory. Sepsis resolved.  Antibiotics discontinued as it is not helping her with her symptoms.  (received for 5 days) Colonoscopy with stricture in terminal ileum - follow bx results (negative) GI following, appreciate recs (IV solumedrol today).  Concern for crohn's with elevated sed rate. TB QuantiFERON - indeterminant.  Stool for C. difficile negative.  GI pathogen panel d/c'd.   Sinus Tachycardia: EKG with sinus tach.  No CP.  Suspect related to prep and hypovolemia.  Bolus and continue IVF.  Continue to monitor.  Improved HR today.   Acute blood loss anemia Normocytic anemia with baseline hemoglobin 8.5.  Dropped to 6.8, improved with 1 unit PRBC.  Downtrending today, suspect  with IVF.  Follow.  Acute on chronic kidney disease stage IV Suspect dehydration due to GI loss.  Bolus and follow IVF.   Improved creatinine today.   Appears to be around her baseline  Hypokalemia  Hypokalemia Replace and follow  Failure to thrive and poor p.o. intake PT and nutrition consulted.  Recommend SNF  Type 2 diabetes mellitus, controlled A1c of 5.2.  Monitor on sliding scale coverage  ?  Depression and frontotemporal dementia. PCP had concern brought up by family that patient was not adherent to treatment regimen.  She tells me that she was having frequent dizzy spells and and headache and feeling depressed for past several weeks.  Reported feeling worthless but denied any suicidal ideation.  She is on Lexapro and dose was recently increased by her PCP.   Psych consult appreciated. Agrees with increased dose of lexapro. QTc normal.  DVT prophylaxis: SCD Code Status: dnr Family Communication: none at bedside - called son and discussed Disposition Plan: pending further improvement   Consultants:   GI  Procedures: Colonoscopy - Stricture in the terminal ileum. Biopsied. - Diverticulosis in the left colon. - The examination was otherwise normal on direct and retroflexion views. Impression: Not Applicable - Patient had care per Anesthesia. Moderate Sedation: - Patient has a contact number available for emergencies. The signs and symptoms of potential delayed complications were discussed with the patient. Return to normal activities tomorrow. Written discharge instructions were provided to the patient. - Resume previous diet. - Continue present medications. - Await pathology results.  Antimicrobials: Anti-infectives (From admission, onward)  Start     Dose/Rate Route Frequency Ordered Stop   05/30/19 1400  piperacillin-tazobactam (ZOSYN) IVPB 2.25 g  Status:  Discontinued     2.25 g 100 mL/hr over 30 Minutes Intravenous Every 8 hours 05/30/19 1343  06/04/19 0858     Subjective: Feeling better today  Objective: Vitals:   06/07/19 0106 06/07/19 0500 06/07/19 0513 06/07/19 1406  BP: (!) 132/57  (!) 142/66 138/65  Pulse: 90  85 83  Resp: 16  16 16   Temp: 98.7 F (37.1 C)  98.6 F (37 C) 98.9 F (37.2 C)  TempSrc:    Oral  SpO2: 99%  98% 98%  Weight:  51.5 kg    Height:        Intake/Output Summary (Last 24 hours) at 06/07/2019 1738 Last data filed at 06/07/2019 1500 Gross per 24 hour  Intake 549 ml  Output 2700 ml  Net -2151 ml   Filed Weights   06/05/19 0500 06/06/19 0548 06/07/19 0500  Weight: 52 kg 50.1 kg 51.5 kg    Examination:  General: No acute distress. Cardiovascular: Heart sounds show a regular rate, and rhythm. Lungs: Clear to auscultation bilaterally Abdomen: Soft, nontender, nondistended  Neurological: Alert and oriented 3. Moves all extremities 4. Cranial nerves II through XII grossly intact. Skin: Warm and dry. No rashes or lesions. Extremities: No clubbing or cyanosis. No edema.    Data Reviewed: I have personally reviewed following labs and imaging studies  CBC: Recent Labs  Lab 06/02/19 0530 06/03/19 0844 06/04/19 0824 06/06/19 0536 06/07/19 0501  WBC 5.5 4.9 6.3 6.2 7.8  HGB 9.7* 11.4* 10.5* 11.3* 8.8*  HCT 30.0* 36.5 32.2* 35.6* 28.2*  MCV 95.5 97.1 94.2 97.3 97.6  PLT 333 382 332 371 096   Basic Metabolic Panel: Recent Labs  Lab 06/02/19 0530 06/04/19 1013 06/05/19 0557 06/06/19 0536 06/07/19 0501  NA 144 140 141 146* 140  K 3.9 3.6 3.4* 4.1 2.9*  CL 104 105 112* 114* 107  CO2 28 26 23  20* 22  GLUCOSE 86 121* 112* 155* 88  BUN 19 21 16 19 16   CREATININE 2.53* 2.82* 2.25* 2.58* 2.21*  CALCIUM 8.2* 8.5* 7.7* 8.7* 7.9*  MG  --   --   --   --  1.3*   GFR: Estimated Creatinine Clearance: 16.5 mL/min (A) (by C-G formula based on SCr of 2.21 mg/dL (H)). Liver Function Tests: Recent Labs  Lab 06/07/19 0501  AST 13*  ALT 8  ALKPHOS 52  BILITOT 0.4  PROT 5.3*   ALBUMIN 2.1*   No results for input(s): LIPASE, AMYLASE in the last 168 hours. No results for input(s): AMMONIA in the last 168 hours. Coagulation Profile: No results for input(s): INR, PROTIME in the last 168 hours. Cardiac Enzymes: No results for input(s): CKTOTAL, CKMB, CKMBINDEX, TROPONINI in the last 168 hours. BNP (last 3 results) No results for input(s): PROBNP in the last 8760 hours. HbA1C: No results for input(s): HGBA1C in the last 72 hours. CBG: Recent Labs  Lab 06/06/19 1632 06/06/19 2016 06/07/19 0819 06/07/19 1147 06/07/19 1620  GLUCAP 105* 131* 88 94 121*   Lipid Profile: No results for input(s): CHOL, HDL, LDLCALC, TRIG, CHOLHDL, LDLDIRECT in the last 72 hours. Thyroid Function Tests: No results for input(s): TSH, T4TOTAL, FREET4, T3FREE, THYROIDAB in the last 72 hours. Anemia Panel: No results for input(s): VITAMINB12, FOLATE, FERRITIN, TIBC, IRON, RETICCTPCT in the last 72 hours. Sepsis Labs: No results for input(s): PROCALCITON, LATICACIDVEN in the  last 168 hours.  Recent Results (from the past 240 hour(s))  Blood culture (routine x 2)     Status: None   Collection Time: 05/30/19  6:16 AM   Specimen: BLOOD  Result Value Ref Range Status   Specimen Description   Final    BLOOD BLOOD RIGHT FOREARM Performed at [facility] [address], [place], [place] [address]    Special Requests   Final    BOTTLES DRAWN AEROBIC ONLY Blood Culture adequate volume Performed at [facility] [address], [place], [place] [address]    Culture   Final    NO GROWTH 5 DAYS Performed at [facility], [place] [address], [place], [place] [address]    Report Status 06/04/2019 FINAL  Final  Blood culture (routine x 2)     Status: None   Collection Time: 05/30/19  6:44 AM   Specimen: BLOOD  Result Value Ref Range Status   Specimen Description   Final    BLOOD LEFT ANTECUBITAL Performed at [facility] [address] [place], [place] [address]    Special Requests   Final    BOTTLES DRAWN AEROBIC AND ANAEROBIC Blood Culture results may not be optimal due to an excessive volume of blood received in culture bottles Performed at [facility] [address], [place], [place] [address]    Culture   Final    NO GROWTH 5 DAYS Performed at [facility], [place] [address], [place], [place] [address]    Report Status 06/04/2019 FINAL  Final  SARS Coronavirus 2 [facility] order, Performed in [facility] hospital lab) Nasopharyngeal Nasopharyngeal Swab     Status: None   Collection Time: 05/30/19 12:16 PM   Specimen: Nasopharyngeal Swab  Result Value Ref Range Status   SARS Coronavirus 2 NEGATIVE NEGATIVE Final    Comment: (NOTE) If result is NEGATIVE SARS-CoV-2 target nucleic acids are NOT DETECTED. The SARS-CoV-2 RNA is generally detectable in upper and lower  respiratory specimens during the acute phase of infection. The lowest  concentration of SARS-CoV-2 viral copies this assay can detect is 250  copies / mL. A negative result does not preclude SARS-CoV-2 infection  and should not be used as the sole basis for treatment or other  patient management decisions.  A negative result may occur with  improper specimen collection / handling, submission of specimen other  than nasopharyngeal swab, presence of viral mutation(s) within the  areas targeted by this assay, and inadequate number of viral copies  (<250 copies / mL). A negative result must be combined with clinical  observations, patient history, and epidemiological information. If result is POSITIVE SARS-CoV-2 target nucleic acids are DETECTED. The SARS-CoV-2 RNA is generally detectable in upper and lower  respiratory specimens dur ing the acute phase of infection.  Positive  results are indicative of active infection with SARS-CoV-2.  Clinical  correlation with patient history and other diagnostic information  is  necessary to determine patient infection status.  Positive results do  not rule out bacterial infection or co-infection with other viruses. If result is PRESUMPTIVE POSTIVE SARS-CoV-2 nucleic acids MAY BE PRESENT.   A presumptive positive result was obtained on the submitted specimen  and confirmed on repeat testing.  While 2019 novel coronavirus  (SARS-CoV-2) nucleic acids may be present in the submitted sample  additional confirmatory testing may be necessary for epidemiological  and / or clinical management purposes  to  differentiate between  SARS-CoV-2 and other Sarbecovirus currently known to infect humans.  If clinically indicated additional testing with an alternate test  methodology [phone]) is advised. The SARS-CoV-2 RNA is generally  detectable in upper and lower respiratory sp ecimens during the acute  phase of infection. The expected result is Negative. Fact Sheet for Patients:  StrictlyIdeas.no Fact Sheet for Healthcare Providers: BankingDealers.co.za This test is not yet approved or cleared by the [place] FDA and has been authorized for detection and/or diagnosis of SARS-CoV-2 by FDA under an Emergency Use Authorization (EUA).  This EUA will remain in effect (meaning this test can be used) for the duration of the COVID-19 declaration under Section 564(b)(1) of the Act, 21 U.S.C. section 360bbb-3(b)(1), unless the authorization is terminated or revoked sooner. Performed at [facility], [place] [address] [place], [place] [address]   C difficile quick scan w PCR reflex     Status: None   Collection Time: 06/03/19 12:14 PM   Specimen: STOOL  Result Value Ref Range Status   C Diff antigen NEGATIVE NEGATIVE Final   C Diff toxin NEGATIVE NEGATIVE Final   C Diff interpretation No C. difficile detected.  Final    Comment: Performed at [facility], [place] [address] [place],  [place] [address]  Novel Coronavirus, NAA (hospital order; send-out to ref lab)     Status: None   Collection Time: 06/04/19  6:24 PM   Specimen: Nasopharyngeal Swab; Respiratory  Result Value Ref Range Status   SARS-CoV-2, NAA NOT DETECTED NOT DETECTED Final    Comment: (NOTE) This test was developed and its performance characteristics determined by [employer]. This test has not been FDA cleared or approved. This test has been authorized by FDA under an Emergency Use Authorization (EUA). This test is only authorized for the duration of time the declaration that circumstances exist justifying the authorization of the emergency use of in vitro diagnostic tests for detection of SARS-CoV-2 virus and/or diagnosis of COVID-19 infection under section 564(b)(1) of the Act, 21 U.S.C. 774JOI-7(O)(6), unless the authorization is terminated or revoked sooner. When diagnostic testing is negative, the possibility of a false negative result should be considered in the context of a patient's recent exposures and the presence of clinical signs and symptoms consistent with COVID-19. An individual without symptoms of COVID-19 and who is not shedding SARS-CoV-2 virus would expect to have a negative (not detected) result in this assay. Performed  At: [facility] [place], [place] [id number] Rush Farmer MD JG:[id number]    [place]  Final    Comment: Performed at [facility] [address], [place],  [address]         Radiology Studies: No results found.      Scheduled Meds: . amLODipine  10 mg Oral Daily  . atorvastatin  20 mg Oral Daily  . cholecalciferol  1,000 Units Oral Daily  . escitalopram  20 mg Oral Daily  . ferrous sulfate  325 mg Oral Q breakfast  . insulin aspart  0-5 Units Subcutaneous QHS  . insulin aspart  0-9 Units Subcutaneous TID WC  . methylPREDNISolone (SOLU-MEDROL) injection  40 mg  Intravenous Daily  . mirtazapine  15 mg Oral QHS  . pantoprazole  40 mg Oral Daily  . vitamin B-12  1,000 mcg Oral Daily   Continuous Infusions: . sodium chloride 100 mL/hr at 06/07/19 1452     LOS: 8 days  Time spent: over 30 min    Fayrene Helper, MD [facility] Pager AMION  If 7PM-7AM, please contact night-coverage [email or web address] Password [facility] 06/07/2019, 5:38 PM

## 2019-06-07 NOTE — TOC Progression Note (Signed)
Transition of Care [facility]) - Progression Note    Patient Details  Name: HABIBA TRELOAR MRN: [id number] Date of Birth: [birth year]  Transition of Care [facility]) CM/SW [place], [place] Phone Number: 06/07/2019, 3:06 PM  Clinical Narrative:  Continued Medical work up   Patient accepted to [facility] for short rehab. TOC team following for disposition needs to SNF.    Expected Discharge Plan: Skilled Nursing Facility Barriers to Discharge: (Continued Medical Work up, COVID-19 test pending)  Expected Discharge Plan and Services Expected Discharge Plan: [place]   Discharge Planning Services: CM Consult Post Acute Care Choice: [place] Living arrangements for the past 2 months: Single Family Home Expected Discharge Date: (unknown)                                     Social Determinants of Health (SDOH) Interventions    Readmission Risk Interventions Readmission Risk Prevention Plan 06/04/2019 05/31/2019 04/10/2019  Transportation Screening Complete Complete Complete  PCP or Specialist Appt within 3-5 Days - - -  [place] or [place] Work Consult for [place] Planning/Counseling - - -  [place] - - -  Medication Review ([place]) Complete Referral to Pharmacy Referral to Pharmacy  PCP or Specialist appointment within 3-5 days of discharge Complete Complete Complete  [place] or [place] - Complete Complete  SW Recovery Care/Counseling Consult Complete Complete Complete  Palliative Care Screening Not Applicable Not Applicable Not Applicable  Skilled Nursing Facility - Not Applicable Not Applicable  Some recent data might be hidden

## 2019-06-07 NOTE — Progress Notes (Addendum)
Patient ID: Samantha Mckenzie, female   DOB: 30-Aug-1936, 83 y.o.   MRN: [id number]    Progress Note   Subjective  Day # 9 CC: terminal ileitis, probable Crohn's  QuantiFERON gold negative Colonoscopy 06/06/2019-biopsies pending-terminal ileum with a benign appearing intrinsic moderate stenosis at the ileocecal valve with 4 mm diameter, non-traversed, multiple colonic diverticuli otherwise: Unremarkable  Hemoglobin down to 8.8 K+ 2.9 Mg 1.3 Creatinine 2.2-improved  Patient says she feels significantly better today, denies any nausea,, was able to take liquid diet for breakfast.  No significant abdominal pain     Objective   Vital signs in last 24 hours: Temp:  [97.8 F (36.6 C)-98.7 F (37.1 C)] 98.6 F (37 C) (08/27 0513) Pulse Rate:  [77-120] 85 (08/27 0513) Resp:  [14-21] 16 (08/27 0513) BP: (132-223)/(55-83) 142/66 (08/27 0513) SpO2:  [98 %-100 %] 98 % (08/27 0513) Weight:  [51.5 kg] 51.5 kg (08/27 0500) Last BM Date: 06/05/19 General:   Elderly white female in NAD Heart:  Regular rate and rhythm; no murmurs Lungs: Respirations even and unlabored, lungs CTA bilaterally Abdomen:  Soft, nontender and nondistended. Normal bowel sounds. Extremities:  Without edema. Neurologic:  Alert and oriented,  grossly normal neurologically. Psych:  Cooperative. Normal mood and affect.  Intake/Output from previous day: 08/26 0701 - 08/27 0700 In: 359 [I.V.:359] Out: 1750 [Urine:1750] Intake/Output this shift: No intake/output data recorded.  Lab Results: Recent Labs    06/06/19 0536 06/07/19 0501  WBC 6.2 7.8  HGB 11.3* 8.8*  HCT 35.6* 28.2*  PLT 371 280   BMET Recent Labs    06/05/19 0557 06/06/19 0536 06/07/19 0501  NA 141 146* 140  K 3.4* 4.1 2.9*  CL 112* 114* 107  CO2 23 20* 22  GLUCOSE 112* 155* 88  BUN 16 19 16   CREATININE 2.25* 2.58* 2.21*  CALCIUM 7.7* 8.7* 7.9*   LFT Recent Labs    06/07/19 0501  PROT 5.3*  ALBUMIN 2.1*  AST 13*  ALT 8   ALKPHOS 52  BILITOT 0.4     Assessment / Plan:    #70 83 year old female with terminal ileitis, with long segment of involvement. She is stable status post colonoscopy yesterday, unable to traverse the terminal ileum due to stenosis but biopsies were taken and are pending.  Very likely this is Crohn's disease.  QuantiFERON gold negative.  We will go ahead and initiate Solu-Medrol today 40 mg daily,  Advance to soft diet Continue Compazine as needed  #2 acute kidney injury on chronic kidney disease-continuing IV fluids #3 hypokalemia and hypomagnesemia-being replaced today #4 depression-treating #5 debilitation-hopefully can get her up ambulating with PT #6 anemia   Principal Problem:   Depression Active Problems:   Normocytic anemia   Essential hypertension   Generalized weakness   Nausea vomiting and diarrhea   Acute renal failure superimposed on stage 4 chronic kidney disease (HCC)   Lactic acidosis   Hypokalemia   Terminal ileitis with complication (HCC)   Ileitis   Dehydration   Acute blood loss anemia   Abnormal CT scan, small bowel    LOS: 8 days   Amy Esterwood PA-C 06/07/2019, 8:47 AM     Attending Physician Note   I have taken an interval history, reviewed the chart and examined the patient. I agree with the Advanced Practitioner's note, impression and recommendations.   Ileitis, suspected Crohn's ileitis despite negative biopsies, started Solu-Medrol today. Biopsies were normal however unable to intubate TI at colonoscopy due to  stricture so biopsies not representative of significant pathology noted on CT. If she tolerates advanced diet and K+ is corrected can change to prednisone po and plan outpatient GI follow up.  Lucio Edward, MD [facility]

## 2019-06-08 DIAGNOSIS — R933 Abnormal findings on diagnostic imaging of other parts of digestive tract: Secondary | ICD-10-CM

## 2019-06-08 LAB — COMPREHENSIVE METABOLIC PANEL
ALT: 9 U/L (ref 0–44)
AST: 13 U/L — ABNORMAL LOW (ref 15–41)
Albumin: 2.3 g/dL — ABNORMAL LOW (ref 3.5–5.0)
Alkaline Phosphatase: 61 U/L (ref 38–126)
Anion gap: 10 (ref 5–15)
BUN: 19 mg/dL (ref 8–23)
CO2: 21 mmol/L — ABNORMAL LOW (ref 22–32)
Calcium: 8.2 mg/dL — ABNORMAL LOW (ref 8.9–10.3)
Chloride: 108 mmol/L (ref 98–111)
Creatinine, Ser: 2.24 mg/dL — ABNORMAL HIGH (ref 0.44–1.00)
GFR calc Af Amer: 23 mL/min — ABNORMAL LOW (ref >60)
GFR calc non Af Amer: 20 mL/min — ABNORMAL LOW (ref >60)
Glucose, Bld: 120 mg/dL — ABNORMAL HIGH (ref 70–99)
Potassium: 4.9 mmol/L (ref 3.5–5.1)
Sodium: 139 mmol/L (ref 135–145)
Total Bilirubin: 0.3 mg/dL (ref 0.3–1.2)
Total Protein: 5.8 g/dL — ABNORMAL LOW (ref 6.5–8.1)

## 2019-06-08 LAB — CBC
HCT: 29.8 % — ABNORMAL LOW (ref 36.0–46.0)
Hemoglobin: 9.4 g/dL — ABNORMAL LOW (ref 12.0–15.0)
MCH: 30.7 pg (ref 26.0–34.0)
MCHC: 31.5 g/dL (ref 30.0–36.0)
MCV: 97.4 fL (ref 80.0–100.0)
Platelets: 268 10*3/uL (ref 150–400)
RBC: 3.06 MIL/uL — ABNORMAL LOW (ref 3.87–5.11)
RDW: 19.5 % — ABNORMAL HIGH (ref 11.5–15.5)
WBC: 6 10*3/uL (ref 4.0–10.5)
nRBC: 0 % (ref 0.0–0.2)

## 2019-06-08 LAB — GLUCOSE, CAPILLARY
Glucose-Capillary: 95 mg/dL (ref 70–99)
Glucose-Capillary: 125 mg/dL — ABNORMAL HIGH (ref 70–99)
Glucose-Capillary: 156 mg/dL — ABNORMAL HIGH (ref 70–99)
Glucose-Capillary: 153 mg/dL — ABNORMAL HIGH (ref 70–99)

## 2019-06-08 LAB — MAGNESIUM: Magnesium: 2 mg/dL (ref 1.7–2.4)

## 2019-06-08 LAB — SARS CORONAVIRUS 2 (TAT 6-24 HRS): SARS Coronavirus 2: NEGATIVE

## 2019-06-08 NOTE — Progress Notes (Signed)
Patient ID: Samantha Mckenzie, female   DOB: 24-Oct-1935, 83 y.o.   MRN: [id number]   Pt has appt with Dr Scarlette Shorts  On 10/1 /2020 at 3 :20 pm  [facility]

## 2019-06-08 NOTE — Progress Notes (Signed)
Patient ID: Samantha Mckenzie, female   DOB: 03-10-1936, 83 y.o.   MRN: [id number]    Progress Note   Subjective  DAY # 10 CC; diarrhea, weakness  Terminal ileitis  Solu-Medrol started yesterday   Ileal biopsies-negative,, however unable to intubate the terminal ileum due to stricture, so not representative of significant pathology seen at CT  Patient says she is feeling much better, and had a good day yesterday.  She is in good spirits, and has been able to eat much better over the past 24 hours. She denies any nausea, no current abdominal pain and is having 2-3 loose to semi-formed stools per day.  She  feels that she is ready to be discharged to short-term rehab    Objective   Vital signs in last 24 hours: Temp:  [98.1 F (36.7 C)-98.9 F (37.2 C)] 98.1 F (36.7 C) (08/28 0559) Pulse Rate:  [83-85] 84 (08/28 0559) Resp:  [16] 16 (08/28 0559) BP: (133-166)/(64-66) 166/64 (08/28 0559) SpO2:  [97 %-100 %] 100 % (08/28 0559) Weight:  [51.6 kg] 51.6 kg (08/28 0500) Last BM Date: 06/07/19 General:    Elderly female in NAD, very pleasant Heart:  Regular rate and rhythm; no murmurs Lungs: Respirations even and unlabored, lungs CTA bilaterally Abdomen:  Soft, minimally tender in the lower abdomen right greater than left, normal bowel sounds. Extremities:  Without edema. Neurologic:  Alert and oriented,  grossly normal neurologically. Psych:  Cooperative. Normal mood and affect.  Intake/Output from previous day: 08/27 0701 - 08/28 0700 In: 2680.9 [P.O.:590; I.V.:2090.9] Out: 950 [Urine:950] Intake/Output this shift: No intake/output data recorded.  Lab Results: Recent Labs    06/06/19 0536 06/07/19 0501 06/08/19 0513  WBC 6.2 7.8 6.0  HGB 11.3* 8.8* 9.4*  HCT 35.6* 28.2* 29.8*  PLT 371 280 268   BMET Recent Labs    06/06/19 0536 06/07/19 0501 06/08/19 0513  NA 146* 140 139  K 4.1 2.9* 4.9  CL 114* 107 108  CO2 20* 22 21*  GLUCOSE 155* 88 120*  BUN 19 16 19    CREATININE 2.58* 2.21* 2.24*  CALCIUM 8.7* 7.9* 8.2*   LFT Recent Labs    06/08/19 0513  PROT 5.8*  ALBUMIN 2.3*  AST 13*  ALT 9  ALKPHOS 61  BILITOT 0.3   PT/INR No results for input(s): LABPROT, INR in the last 72 hours.      Assessment / Plan:    #64 83 year old female with new diagnosis of terminal ileitis with long segment of involvement.  Patient has a stricture at the terminal ileum which was unable to be intubated at colonoscopy.  Biopsies are negative, but not felt to be representative. High suspicion for Crohn's disease QuantiFERON gold negative Solu-Medrol started yesterday 40 mg daily  Patient is doing well, appetite improving and only having 2-3 stools per day  #2 AK I on chronic kidney disease #3 hypokalemia resolved #4 depression-treating #5 normocytic anemia-stable, chronic  Plan; continue soft diet Can convert IV Solu-Medrol to oral prednisone 40 mg p.o. every morning, on discharge.  Would plan 40 mg prednisone daily x2 weeks then decrease to 30 mg daily x2 weeks, then 20 mg daily x2 weeks then 15 mg daily x2 weeks, then 10 mg daily x2 weeks.  Imodium as needed GI will plan to see her in the office for appointment with Dr. Henrene Pastor in 3 to 4 weeks.  Any further work-up can be pursued at that time.  GI will sign off.  Principal Problem:   Depression Active Problems:   Normocytic anemia   Essential hypertension   Generalized weakness   Nausea vomiting and diarrhea   Acute renal failure superimposed on stage 4 chronic kidney disease (HCC)   Lactic acidosis   Hypokalemia   Terminal ileitis with complication (HCC)   Ileitis   Dehydration   Acute blood loss anemia   Abnormal CT scan, small bowel     LOS: 9 days   Amy EsterwoodPA-C  06/08/2019, 8:43 AM

## 2019-06-08 NOTE — Care Management Important Message (Signed)
Important Message  Patient Details IM Letter given to Sharren [place] SW to present to the Patient Name: WESLEE PRESTAGE MRN: [id number] Date of Birth: 1936/06/01   Medicare Important Message Given:  Yes     Kerin Salen 06/08/2019, 10:16 AM

## 2019-06-08 NOTE — Progress Notes (Addendum)
PROGRESS NOTE    Samantha Mckenzie  NAT:[id number] DOB: [birth year] DOA: 05/30/2019 PCP: Colon Branch, MD   Brief Narrative:  83 year old female with history of GERD, prior history of focal enteritis versus?  IBD, type 2 diabetes mellitus with peripheral neuropathy, COPD, chronic diastolic CHF, breast cancer status post left mastectomy was brought to the ED from home with diffuse abdominal pain, nausea, vomiting and diarrhea.  Also reported frank blood in stool, poor p.o. intake. In the ED she was found to be in early sepsis with tachycardia, tachypnea, hypertensive, AKI and hypokalemia.  Also had elevated lactic acid of 2.9.  CT abdomen pelvis without contrast showed findings of terminal ileitis.  Admitted to hospital service on empiric antibiotics.   Assessment & Plan:   Principal Problem:   Depression Active Problems:   Normocytic anemia   Essential hypertension   Generalized weakness   Nausea vomiting and diarrhea   Acute renal failure superimposed on stage 4 chronic kidney disease (HCC)   Lactic acidosis   Hypokalemia   Terminal ileitis with complication (HCC)   Ileitis   Dehydration   Acute blood loss anemia   Abnormal CT scan, small bowel  Sepsis with terminal ileitis, infectious versus inflammatory. Sepsis resolved.  Antibiotics discontinued as it is not helping her with her symptoms.  (received for 5 days) Colonoscopy with stricture in terminal ileum - follow bx results (negative) GI following, appreciate recs (IV solumedrol today).  Concern for crohn's with elevated sed rate. TB QuantiFERON - indeterminant.  Stool for C. difficile negative.  GI pathogen panel d/c'd. Steroids to taper per GI recommendations.   Sinus Tachycardia: EKG with sinus tach.  No CP.  Suspect related to prep and hypovolemia.  Bolus and continue IVF.  Continue to monitor.  Improved HR today.   Acute blood loss anemia Normocytic anemia with baseline hemoglobin 8.5.  Dropped to 6.8, improved with 1  unit PRBC.  Stable, continue to monitor  Acute on chronic kidney disease stage IV Suspect dehydration due to GI loss.  Bolus and follow IVF.   Improved creatinine today.   Appears to be around her baseline  Hypokalemia  Hypokalemia Improved, continue to monitor.  Caution with replacement with CKD.  Failure to thrive and poor p.o. intake PT and nutrition consulted.  Recommend SNF  Type 2 diabetes mellitus, controlled A1c of 5.2.  Monitor on sliding scale coverage  ?  Depression and frontotemporal dementia. PCP had concern brought up by family that patient was not adherent to treatment regimen.  She tells previous provider that she was having frequent dizzy spells and and headache and feeling depressed for past several weeks.  Reported feeling worthless but denied any suicidal ideation.  She is on Lexapro and dose was recently increased by her PCP.   Psych was consulted and recommended lexapro 20 mg daily.  Importantly they noted that she does not demonstrate capacity to make decisions regarding her current medical decision and recommended an authorized representative to help her make medical decisions.  DVT prophylaxis: SCD Code Status: dnr Family Communication: none at bedside - called son and discussed Disposition Plan: pending further improvement   Consultants:   GI  Procedures: Colonoscopy - Stricture in the terminal ileum. Biopsied. - Diverticulosis in the left colon. - The examination was otherwise normal on direct and retroflexion views. Impression: Not Applicable - Patient had care per Anesthesia. Moderate Sedation: - Patient has a contact number available for emergencies. The signs and symptoms of potential delayed complications  were discussed with the patient. Return to normal activities tomorrow. Written discharge instructions were provided to the patient. - Resume previous diet. - Continue present medications. - Await pathology results.  Antimicrobials:  Anti-infectives (From admission, onward)   Start     Dose/Rate Route Frequency Ordered Stop   05/30/19 1400  piperacillin-tazobactam (ZOSYN) IVPB 2.25 g  Status:  Discontinued     2.25 g 100 mL/hr over 30 Minutes Intravenous Every 8 hours 05/30/19 1343 06/04/19 0858     Subjective: Doing well this mornign  Objective: Vitals:   06/08/19 0500 06/08/19 0559 06/08/19 1327 06/08/19 1333  BP:  (!) 166/64 (!) 154/64   Pulse:  84 84   Resp:  16  16  Temp:  98.1 F (36.7 C)  98.4 F (36.9 C)  TempSrc:  Oral  Oral  SpO2:  100%    Weight: 51.6 kg     Height:        Intake/Output Summary (Last 24 hours) at 06/08/2019 1952 Last data filed at 06/08/2019 1800 Gross per 24 hour  Intake 3300.96 ml  Output -  Net 3300.96 ml   Filed Weights   06/06/19 0548 06/07/19 0500 06/08/19 0500  Weight: 50.1 kg 51.5 kg 51.6 kg    Examination:  General: No acute distress. Cardiovascular: Heart sounds show a regular rate, and rhythm.  Lungs: Clear to auscultation bilaterally  Abdomen: Soft, nontender, nondistended  Neurological: Alert and oriented 3. Moves all extremities 4 . Cranial nerves II through XII grossly intact. Skin: Warm and dry. No rashes or lesions. Extremities: No clubbing or cyanosis. No edema.    Data Reviewed: I have personally reviewed following labs and imaging studies  CBC: Recent Labs  Lab 06/03/19 0844 06/04/19 0824 06/06/19 0536 06/07/19 0501 06/08/19 0513  WBC 4.9 6.3 6.2 7.8 6.0  HGB 11.4* 10.5* 11.3* 8.8* 9.4*  HCT 36.5 32.2* 35.6* 28.2* 29.8*  MCV 97.1 94.2 97.3 97.6 97.4  PLT 382 332 371 280 790   Basic Metabolic Panel: Recent Labs  Lab 06/04/19 1013 06/05/19 0557 06/06/19 0536 06/07/19 0501 06/08/19 0513  NA 140 141 146* 140 139  K 3.6 3.4* 4.1 2.9* 4.9  CL 105 112* 114* 107 108  CO2 26 23 20* 22 21*  GLUCOSE 121* 112* 155* 88 120*  BUN 21 16 19 16 19   CREATININE 2.82* 2.25* 2.58* 2.21* 2.24*  CALCIUM 8.5* 7.7* 8.7* 7.9* 8.2*  MG  --   --    --  1.3* 2.0   GFR: Estimated Creatinine Clearance: 16.3 mL/min (A) (by C-G formula based on SCr of 2.24 mg/dL (H)). Liver Function Tests: Recent Labs  Lab 06/07/19 0501 06/08/19 0513  AST 13* 13*  ALT 8 9  ALKPHOS 52 61  BILITOT 0.4 0.3  PROT 5.3* 5.8*  ALBUMIN 2.1* 2.3*   No results for input(s): LIPASE, AMYLASE in the last 168 hours. No results for input(s): AMMONIA in the last 168 hours. Coagulation Profile: No results for input(s): INR, PROTIME in the last 168 hours. Cardiac Enzymes: No results for input(s): CKTOTAL, CKMB, CKMBINDEX, TROPONINI in the last 168 hours. BNP (last 3 results) No results for input(s): PROBNP in the last 8760 hours. HbA1C: No results for input(s): HGBA1C in the last 72 hours. CBG: Recent Labs  Lab 06/07/19 1620 06/07/19 2140 06/08/19 0755 06/08/19 1148 06/08/19 1620  GLUCAP 121* 134* 95 125* 156*   Lipid Profile: No results for input(s): CHOL, HDL, LDLCALC, TRIG, CHOLHDL, LDLDIRECT in the last 72 hours.  Thyroid Function Tests: No results for input(s): TSH, T4TOTAL, FREET4, T3FREE, THYROIDAB in the last 72 hours. Anemia Panel: No results for input(s): VITAMINB12, FOLATE, FERRITIN, TIBC, IRON, RETICCTPCT in the last 72 hours. Sepsis Labs: No results for input(s): PROCALCITON, LATICACIDVEN in the last 168 hours.  Recent Results (from the past 240 hour(s))  Blood culture (routine x 2)     Status: None   Collection Time: 05/30/19  6:16 AM   Specimen: BLOOD  Result Value Ref Range Status   Specimen Description   Final    BLOOD BLOOD RIGHT FOREARM Performed at [facility] [address], [place], [place] [address]    Special Requests   Final    BOTTLES DRAWN AEROBIC ONLY Blood Culture adequate volume Performed at [facility] [address], [place], [place] [address]    Culture   Final    NO GROWTH 5 DAYS Performed at [facility], [place] [address] [place], [place] [address]     Report Status 06/04/2019 FINAL  Final  Blood culture (routine x 2)     Status: None   Collection Time: 05/30/19  6:44 AM   Specimen: BLOOD  Result Value Ref Range Status   Specimen Description   Final    BLOOD LEFT ANTECUBITAL Performed at [facility] [address] [place], [place] [address]    Special Requests   Final    BOTTLES DRAWN AEROBIC AND ANAEROBIC Blood Culture results may not be optimal due to an excessive volume of blood received in culture bottles Performed at [facility] [address], [place], [place] [address]    Culture   Final    NO GROWTH 5 DAYS Performed at [facility], [place] [address] [place], [place] [address]    Report Status 06/04/2019 FINAL  Final  SARS Coronavirus 2 [facility] order, Performed in [facility] hospital lab) Nasopharyngeal Nasopharyngeal Swab     Status: None   Collection Time: 05/30/19 12:16 PM   Specimen: Nasopharyngeal Swab  Result Value Ref Range Status   SARS Coronavirus 2 NEGATIVE NEGATIVE Final    Comment: (NOTE) If result is NEGATIVE SARS-CoV-2 target nucleic acids are NOT DETECTED. The SARS-CoV-2 RNA is generally detectable in upper and lower  respiratory specimens during the acute phase of infection. The lowest  concentration of SARS-CoV-2 viral copies this assay can detect is 250  copies / mL. A negative result does not preclude SARS-CoV-2 infection  and should not be used as the sole basis for treatment or other  patient management decisions.  A negative result may occur with  improper specimen collection / handling, submission of specimen other  than nasopharyngeal swab, presence of viral mutation(s) within the  areas targeted by this assay, and inadequate number of viral copies  (<250 copies / mL). A negative result must be combined with clinical  observations, patient history, and epidemiological information. If result is POSITIVE SARS-CoV-2 target nucleic acids are  DETECTED. The SARS-CoV-2 RNA is generally detectable in upper and lower  respiratory specimens dur ing the acute phase of infection.  Positive  results are indicative of active infection with SARS-CoV-2.  Clinical  correlation with patient history and other diagnostic information is  necessary to determine patient infection status.  Positive results do  not rule out bacterial infection or co-infection with other viruses. If result is PRESUMPTIVE POSTIVE SARS-CoV-2 nucleic acids MAY BE PRESENT.   A presumptive positive result was obtained  on the submitted specimen  and confirmed on repeat testing.  While 2019 novel coronavirus  (SARS-CoV-2) nucleic acids may be present in the submitted sample  additional confirmatory testing may be necessary for epidemiological  and / or clinical management purposes  to differentiate between  SARS-CoV-2 and other Sarbecovirus currently known to infect humans.  If clinically indicated additional testing with an alternate test  methodology [phone]) is advised. The SARS-CoV-2 RNA is generally  detectable in upper and lower respiratory sp ecimens during the acute  phase of infection. The expected result is Negative. Fact Sheet for Patients:  StrictlyIdeas.no Fact Sheet for Healthcare Providers: BankingDealers.co.za This test is not yet approved or cleared by the [place] FDA and has been authorized for detection and/or diagnosis of SARS-CoV-2 by FDA under an Emergency Use Authorization (EUA).  This EUA will remain in effect (meaning this test can be used) for the duration of the COVID-19 declaration under Section 564(b)(1) of the Act, 21 U.S.C. section 360bbb-3(b)(1), unless the authorization is terminated or revoked sooner. Performed at [facility], [place] [address] [place], [place] [address]   C difficile quick scan w PCR reflex     Status: None   Collection Time: 06/03/19 12:14  PM   Specimen: STOOL  Result Value Ref Range Status   C Diff antigen NEGATIVE NEGATIVE Final   C Diff toxin NEGATIVE NEGATIVE Final   C Diff interpretation No C. difficile detected.  Final    Comment: Performed at [facility], [place] [address], [place], [place] [address]  Novel Coronavirus, NAA (hospital order; send-out to ref lab)     Status: None   Collection Time: 06/04/19  6:24 PM   Specimen: Nasopharyngeal Swab; Respiratory  Result Value Ref Range Status   SARS-CoV-2, NAA NOT DETECTED NOT DETECTED Final    Comment: (NOTE) This test was developed and its performance characteristics determined by [employer]. This test has not been FDA cleared or approved. This test has been authorized by FDA under an Emergency Use Authorization (EUA). This test is only authorized for the duration of time the declaration that circumstances exist justifying the authorization of the emergency use of in vitro diagnostic tests for detection of SARS-CoV-2 virus and/or diagnosis of COVID-19 infection under section 564(b)(1) of the Act, 21 U.S.C. 258NID-7(O)(2), unless the authorization is terminated or revoked sooner. When diagnostic testing is negative, the possibility of a false negative result should be considered in the context of a patient's recent exposures and the presence of clinical signs and symptoms consistent with COVID-19. An individual without symptoms of COVID-19 and who is not shedding SARS-CoV-2 virus would expect to have a negative (not detected) result in this assay. Performed  At: [facility] [place], [place] [id number] Rush Farmer MD RX:[id number]    [place]  Final    Comment: Performed at [facility] [address] [place], [place] [address]         Radiology Studies: No results found.      Scheduled Meds: . amLODipine  10 mg Oral Daily  . atorvastatin  20 mg Oral  Daily  . cholecalciferol  1,000 Units Oral Daily  . escitalopram  20 mg Oral Daily  . ferrous sulfate  325 mg Oral Q breakfast  . insulin aspart  0-5 Units Subcutaneous QHS  . insulin aspart  0-9 Units Subcutaneous TID WC  . methylPREDNISolone (SOLU-MEDROL) injection  40 mg Intravenous Daily  . mirtazapine  15 mg Oral QHS  . pantoprazole  40 mg Oral Daily  . vitamin B-12  1,000 mcg Oral Daily   Continuous Infusions: . sodium chloride 100 mL/hr at 06/08/19 1702     LOS: 9 days    Time spent: over 30 min    Fayrene Helper, MD [facility] Pager AMION  If 7PM-7AM, please contact night-coverage [email or web address] Password [facility] 06/08/2019, 7:52 PM

## 2019-06-09 DIAGNOSIS — M109 Gout, unspecified: Secondary | ICD-10-CM | POA: Diagnosis not present

## 2019-06-09 DIAGNOSIS — D62 Acute posthemorrhagic anemia: Secondary | ICD-10-CM | POA: Diagnosis not present

## 2019-06-09 DIAGNOSIS — G301 Alzheimer's disease with late onset: Secondary | ICD-10-CM | POA: Diagnosis not present

## 2019-06-09 DIAGNOSIS — D649 Anemia, unspecified: Secondary | ICD-10-CM | POA: Diagnosis not present

## 2019-06-09 DIAGNOSIS — N179 Acute kidney failure, unspecified: Secondary | ICD-10-CM | POA: Diagnosis not present

## 2019-06-09 DIAGNOSIS — R410 Disorientation, unspecified: Secondary | ICD-10-CM | POA: Diagnosis not present

## 2019-06-09 DIAGNOSIS — E118 Type 2 diabetes mellitus with unspecified complications: Secondary | ICD-10-CM | POA: Diagnosis not present

## 2019-06-09 DIAGNOSIS — R635 Abnormal weight gain: Secondary | ICD-10-CM | POA: Diagnosis not present

## 2019-06-09 DIAGNOSIS — M2550 Pain in unspecified joint: Secondary | ICD-10-CM | POA: Diagnosis not present

## 2019-06-09 DIAGNOSIS — R1312 Dysphagia, oropharyngeal phase: Secondary | ICD-10-CM | POA: Diagnosis not present

## 2019-06-09 DIAGNOSIS — R05 Cough: Secondary | ICD-10-CM | POA: Diagnosis not present

## 2019-06-09 DIAGNOSIS — K50019 Crohn's disease of small intestine with unspecified complications: Secondary | ICD-10-CM | POA: Diagnosis not present

## 2019-06-09 DIAGNOSIS — I129 Hypertensive chronic kidney disease with stage 1 through stage 4 chronic kidney disease, or unspecified chronic kidney disease: Secondary | ICD-10-CM | POA: Diagnosis not present

## 2019-06-09 DIAGNOSIS — M6281 Muscle weakness (generalized): Secondary | ICD-10-CM | POA: Diagnosis not present

## 2019-06-09 DIAGNOSIS — E43 Unspecified severe protein-calorie malnutrition: Secondary | ICD-10-CM | POA: Diagnosis not present

## 2019-06-09 DIAGNOSIS — I69828 Other speech and language deficits following other cerebrovascular disease: Secondary | ICD-10-CM | POA: Diagnosis not present

## 2019-06-09 DIAGNOSIS — K5000 Crohn's disease of small intestine without complications: Secondary | ICD-10-CM | POA: Diagnosis not present

## 2019-06-09 DIAGNOSIS — I10 Essential (primary) hypertension: Secondary | ICD-10-CM | POA: Diagnosis not present

## 2019-06-09 DIAGNOSIS — Z7401 Bed confinement status: Secondary | ICD-10-CM | POA: Diagnosis not present

## 2019-06-09 DIAGNOSIS — R41841 Cognitive communication deficit: Secondary | ICD-10-CM | POA: Diagnosis not present

## 2019-06-09 DIAGNOSIS — R2681 Unsteadiness on feet: Secondary | ICD-10-CM | POA: Diagnosis not present

## 2019-06-09 DIAGNOSIS — E785 Hyperlipidemia, unspecified: Secondary | ICD-10-CM | POA: Diagnosis not present

## 2019-06-09 DIAGNOSIS — F329 Major depressive disorder, single episode, unspecified: Secondary | ICD-10-CM | POA: Diagnosis not present

## 2019-06-09 DIAGNOSIS — I5032 Chronic diastolic (congestive) heart failure: Secondary | ICD-10-CM | POA: Diagnosis not present

## 2019-06-09 DIAGNOSIS — J069 Acute upper respiratory infection, unspecified: Secondary | ICD-10-CM | POA: Diagnosis not present

## 2019-06-09 DIAGNOSIS — I959 Hypotension, unspecified: Secondary | ICD-10-CM | POA: Diagnosis not present

## 2019-06-09 DIAGNOSIS — E876 Hypokalemia: Secondary | ICD-10-CM | POA: Diagnosis not present

## 2019-06-09 DIAGNOSIS — F0280 Dementia in other diseases classified elsewhere without behavioral disturbance: Secondary | ICD-10-CM | POA: Diagnosis not present

## 2019-06-09 DIAGNOSIS — N184 Chronic kidney disease, stage 4 (severe): Secondary | ICD-10-CM | POA: Diagnosis not present

## 2019-06-09 LAB — COMPREHENSIVE METABOLIC PANEL
ALT: 12 U/L (ref 0–44)
AST: 15 U/L (ref 15–41)
Albumin: 2.5 g/dL — ABNORMAL LOW (ref 3.5–5.0)
Alkaline Phosphatase: 63 U/L (ref 38–126)
Anion gap: 10 (ref 5–15)
BUN: 34 mg/dL — ABNORMAL HIGH (ref 8–23)
CO2: 22 mmol/L (ref 22–32)
Calcium: 8.1 mg/dL — ABNORMAL LOW (ref 8.9–10.3)
Chloride: 106 mmol/L (ref 98–111)
Creatinine, Ser: 2.2 mg/dL — ABNORMAL HIGH (ref 0.44–1.00)
GFR calc Af Amer: 24 mL/min — ABNORMAL LOW (ref >60)
GFR calc non Af Amer: 20 mL/min — ABNORMAL LOW (ref >60)
Glucose, Bld: 126 mg/dL — ABNORMAL HIGH (ref 70–99)
Potassium: 3.4 mmol/L — ABNORMAL LOW (ref 3.5–5.1)
Sodium: 138 mmol/L (ref 135–145)
Total Bilirubin: 0.4 mg/dL (ref 0.3–1.2)
Total Protein: 6.4 g/dL — ABNORMAL LOW (ref 6.5–8.1)

## 2019-06-09 LAB — CBC
HCT: 32.4 % — ABNORMAL LOW (ref 36.0–46.0)
Hemoglobin: 10.5 g/dL — ABNORMAL LOW (ref 12.0–15.0)
MCH: 30.9 pg (ref 26.0–34.0)
MCHC: 32.4 g/dL (ref 30.0–36.0)
MCV: 95.3 fL (ref 80.0–100.0)
Platelets: 290 10*3/uL (ref 150–400)
RBC: 3.4 MIL/uL — ABNORMAL LOW (ref 3.87–5.11)
RDW: 19.1 % — ABNORMAL HIGH (ref 11.5–15.5)
WBC: 9.6 10*3/uL (ref 4.0–10.5)
nRBC: 0 % (ref 0.0–0.2)

## 2019-06-09 LAB — GLUCOSE, CAPILLARY
Glucose-Capillary: 96 mg/dL (ref 70–99)
Glucose-Capillary: 157 mg/dL — ABNORMAL HIGH (ref 70–99)

## 2019-06-09 MED ORDER — POTASSIUM CHLORIDE CRYS ER 20 MEQ PO TBCR
20.0000 meq | EXTENDED_RELEASE_TABLET | Freq: Once | ORAL | Status: AC
  Administered 2019-06-09: 20 meq via ORAL
  Filled 2019-06-09: qty 1

## 2019-06-09 MED ORDER — FERROUS SULFATE 325 (65 FE) MG PO TABS: 325.0000 mg | ORAL_TABLET | Freq: Every day | ORAL | 0 refills | Status: DC

## 2019-06-09 MED ORDER — PREDNISONE 10 MG PO TABS: ORAL_TABLET | ORAL | 0 refills | Status: DC

## 2019-06-09 NOTE — Progress Notes (Signed)
Report called to Anderson Malta, Therapist, sports at [employer]. Discharge education completed with pt. Pt has no questions or concerns at this time. All belongings to be sent with pt at time of discharge.

## 2019-06-09 NOTE — Discharge Summary (Signed)
Physician Discharge Summary  Samantha Mckenzie LPF:[id number] DOB: 20-Sep-1936 DOA: 05/30/2019  PCP: Colon Branch, MD  Admit date: 05/30/2019 Discharge date: 06/09/2019  Time spent: 40 minutes  Recommendations for Outpatient Follow-up:  1. Follow outpatient CBC/CMP 2. Continue steroid taper as prescribed, follow up with gastroenterology as outpatient 3. Needs repeat quantiferon gold as outpatient.  Indeterminate here. 4. Follow fecal calprotectin if able to be collected prior to discharge. 5. Seen by psych earlier in hospitalization, at that time, recommended authorized representative to help with medical decision making as she did not demonstrate capacity to make decisions regarding her current medical condition (have discussed this with son who is aware).    Discharge Diagnoses:  Principal Problem:   Depression Active Problems:   Normocytic anemia   Essential hypertension   Generalized weakness   Nausea vomiting and diarrhea   Acute renal failure superimposed on stage 4 chronic kidney disease (HCC)   Lactic acidosis   Hypokalemia   Terminal ileitis with complication (HCC)   Ileitis   Dehydration   Acute blood loss anemia   Abnormal CT scan, small bowel   Discharge Condition: stable  Diet recommendation: heart healthy  Filed Weights   06/06/19 0548 06/07/19 0500 06/08/19 0500  Weight: 50.1 kg 51.5 kg 51.6 kg    History of present illness:  83 year old female with history of GERD, prior history of focal enteritis versus? IBD, type 2 diabetes mellitus with peripheral neuropathy, COPD, chronic diastolic CHF, breast cancer status post left mastectomy was brought to the ED from home with diffuse abdominal pain, nausea, vomiting and diarrhea. Also reported frank blood in stool, poor p.o. intake. In the ED she was found to be in early sepsis with tachycardia, tachypnea, hypertensive, AKI and hypokalemia. Also had elevated lactic acid of 2.9. CT abdomen pelvis without contrast  showed findings of terminal ileitis. Admitted to hospital service on empiric antibiotics.  She was admitted and had CT scan with terminal ileitis.  Had conolonscopy with negative biopsies, but stricture was seen in terminal ileum.  Concern for crohn's disease with elevated sed rate and presentation.  Started on IV steroids and has improved.  Plan for d/c on steroid taper with outpatient GI follow up.  See below for further details.  Hospital Course:  Sepsis with terminal ileitis, infectious versus inflammatory. Sepsis resolved. Antibiotics discontinued as it is not helping her with her symptoms. (received for 5 days) Colonoscopy with stricture in terminal ileum - follow bx results (negative - ileal mucosa with no sig path findings, negative for active inflammation and other abnormalities) GI following, appreciate recs (IV solumedrol today).  Concern for crohn's with elevated sed rate.TB QuantiFERON - indeterminant.  Discussed with GI, aware this will need to be repeated. Stool for C. difficile negative. GI pathogen panel d/c'd. Steroids to taper per GI recommendations. Follow fecal calprotectin if able to collect prior to d/c Would plan 40 mg prednisone daily x2 weeks then decrease to 30 mg daily x2 weeks, then 20 mg daily x2 weeks then 15 mg daily x2 weeks, then 10 mg daily x2 weeks.    Sinus Tachycardia: EKG with sinus tach.  No CP.  Suspect related to prep and hypovolemia.  Bolus and continue IVF.  Continue to monitor.  Improved HR today.   Acute blood loss anemia Normocytic anemia with baseline hemoglobin 8.5. Dropped to 6.8, improved with 1 unit PRBC.  Stable, continue to monitor Started on PO iron, follow   Acute on chronic kidney disease stage IV  Suspect dehydration due to GI loss.Bolus and follow IVF.   Improved creatinine today.   Appears to be around her baseline Follow intermittently  Hypokalemia  Hypomagnesemia Improved, continue to monitor.  Caution with  replacement with CKD.  Failure to thrive and poor p.o. intake PT and nutritionconsulted.Recommend SNF  Type 2 diabetes mellitus, controlled A1c of 5.2. Follow intermittently while on steroids.  ? Depression and frontotemporal dementia. PCP had concern brought up by family that patient was not adherent to treatment regimen. She tells previous provider that she was having frequent dizzy spells and and headache and feeling depressed for past several weeks. Reported feeling worthless but denied any suicidal ideation. She is on Lexapro and dose was recently increased by her PCP.  Psych was consulted and recommended lexapro 20 mg daily.  Importantly they noted that she does not demonstrate capacity to make decisions regarding her current medical condition and recommended an authorized representative to help her make medical decisions (discussed with pt son).  Procedures: Colonoscopy - Stricture in the terminal ileum. Biopsied. - Diverticulosis in the left colon. - The examination was otherwise normal on direct and retroflexion views. Impression: Not Applicable - Patient had care per Anesthesia. Moderate Sedation: - Patient has Kainon Varady contact number available for emergencies. The signs and symptoms of potential delayed complications were discussed with the patient. Return to normal activities tomorrow. Written discharge instructions were provided to the patient. - Resume previous diet. - Continue present medications. - Await pathology results.  Consultations:  GI  Psychiatry  Discharge Exam: Vitals:   06/08/19 2009 06/09/19 0546  BP: (!) 148/56 (!) 165/57  Pulse: 83 81  Resp: 16 16  Temp: 98.6 F (37 C) 98.5 F (36.9 C)  SpO2: 97% 100%   Feels well Looking forward to getting out of hospital  General: No acute distress. Cardiovascular: Heart sounds show Sya Nestler regular rate, and rhythm Lungs: Clear to auscultation bilaterally  Abdomen: Soft, nontender,  nondistended Neurological: Alert and oriented 3. Moves all extremities 4 . Cranial nerves II through XII grossly intact. Skin: Warm and dry. No rashes or lesions. Extremities: No clubbing or cyanosis. No edema   Discharge Instructions   Discharge Instructions    Diet - low sodium heart healthy   Complete by: As directed    Discharge instructions   Complete by: As directed    You were seen for ileitis.    This was thought to be due to crohn's disease (inflammatory bowel disease).   You've been started on Denim Start steroid taper at discharge.  You'll need to follow up with gastroenterology as an outpatient.  You had an indeterminate quatiferon gold (TB test).  This will be repeated at some time with gastroenterology in the future.  Return for new, recurrent, or worsening symptoms.    Please ask your PCP to request records from this hospitalization so they know what was done and what the next steps will be.   Increase activity slowly   Complete by: As directed      Allergies as of 06/09/2019      Reactions   Codeine Nausea And Vomiting   Morphine Nausea And Vomiting   Oxycodone-acetaminophen Nausea And Vomiting   Sulfonamide Derivatives Nausea And Vomiting   Tramadol Hcl Nausea And Vomiting      Medication List    STOP taking these medications   albuterol (2.5 MG/3ML) 0.083% nebulizer solution Commonly known as: PROVENTIL   aspirin 81 MG tablet   hydrOXYzine 10 MG tablet Commonly known  as: ATARAX/VISTARIL   ondansetron 4 MG disintegrating tablet Commonly known as: ZOFRAN-ODT     TAKE these medications   acetaminophen 325 MG tablet Commonly known as: TYLENOL Take 2 tablets (650 mg total) by mouth every 6 (six) hours as needed for mild pain, fever or headache (or Fever >/= 101).   allopurinol 100 MG tablet Commonly known as: ZYLOPRIM Take 1 tablet (100 mg total) by mouth daily.   amLODipine 10 MG tablet Commonly known as: NORVASC Take 1 tablet (10 mg total) by  mouth daily.   atorvastatin 20 MG tablet Commonly known as: LIPITOR Take 1 tablet (20 mg total) by mouth daily.   CALTRATE 600+D3 PO Take 1 tablet by mouth daily.   CENTRUM SILVER PO Take 1 tablet by mouth daily.   dicyclomine 10 MG capsule Commonly known as: BENTYL Take 2 capsules (20 mg total) by mouth 4 (four) times daily -  before meals and at bedtime.   dronabinol 2.5 MG capsule Commonly known as: Marinol Take 1 capsule (2.5 mg total) by mouth 2 (two) times daily before Ariauna Farabee meal.   escitalopram 10 MG tablet Commonly known as: Lexapro Take 2 tablets (20 mg total) by mouth daily.   ferrous sulfate 325 (65 FE) MG tablet Take 1 tablet (325 mg total) by mouth daily with breakfast. Start taking on: June 10, 2019   loperamide 2 MG tablet Commonly known as: IMODIUM Cesily Cuoco-D Take 2 mg by mouth 4 (four) times daily as needed for diarrhea or loose stools.   metoprolol tartrate 25 MG tablet Commonly known as: LOPRESSOR Take 1 tablet (25 mg total) by mouth 2 (two) times daily.   mirtazapine 15 MG tablet Commonly known as: REMERON Take 15 mg by mouth at bedtime.   pantoprazole 40 MG tablet Commonly known as: PROTONIX Take 1 tablet (40 mg total) by mouth daily before breakfast.   predniSONE 10 MG tablet Commonly known as: DELTASONE Take 4 tablets (40 mg total) by mouth daily with breakfast for 14 days, THEN 3 tablets (30 mg total) daily with breakfast for 14 days, THEN 2 tablets (20 mg total) daily with breakfast for 14 days, THEN 1.5 tablets (15 mg total) daily with breakfast for 14 days, THEN 1 tablet (10 mg total) daily with breakfast for 14 days. Start taking on: June 09, 2019   vitamin B-12 1000 MCG tablet Commonly known as: CYANOCOBALAMIN Take 1,000 mcg by mouth daily.   vitamin C 500 MG tablet Commonly known as: ASCORBIC ACID Take 500 mg by mouth daily.   Vitamin D3 25 MCG (1000 UT) Caps Take 1 capsule by mouth daily.      Allergies  Allergen Reactions  .  Codeine Nausea And Vomiting  . Morphine Nausea And Vomiting  . Oxycodone-Acetaminophen Nausea And Vomiting  . Sulfonamide Derivatives Nausea And Vomiting  . Tramadol Hcl Nausea And Vomiting    Contact information for follow-up providers    Irene Shipper, MD Follow up on 07/12/2019.   Specialty: Gastroenterology Why:  at 3;20 pm  Contact information: [address] [address] [phone]        Colon Branch, MD Follow up.   Specialty: Internal Medicine Contact information: [address] [place] [address] [phone]            Contact information for after-discharge care    Destination    [facility] Preferred SNF .   Service: Skilled Nursing Contact information: [phone]  [place] [phone]                   The results of significant diagnostics from this hospitalization (including imaging, microbiology, ancillary and laboratory) are listed below for reference.    Significant Diagnostic Studies: Ct Abdomen Pelvis Wo Contrast  Result Date: 05/30/2019 CLINICAL DATA:  Generalized abdominal pain, nausea EXAM: CT ABDOMEN AND PELVIS WITHOUT CONTRAST TECHNIQUE: Multidetector CT imaging of the abdomen and pelvis was performed following the standard protocol without IV contrast. COMPARISON:  04/20/2019 FINDINGS: Lower chest: No acute abnormality. Hepatobiliary: No focal liver abnormality is seen. No gallstones, gallbladder wall thickening, or biliary dilatation. Pancreas: Unremarkable. No pancreatic ductal dilatation or surrounding inflammatory changes. Spleen: Scattered calcified granulomas within the spleen. No acute findings. Adrenals/Urinary Tract: Unremarkable adrenal glands. Bilateral renal cortical atrophy. Stable appearance of bilateral renal cysts. Extrarenal pelvis is noted bilaterally. No hydronephrosis. Ureters nondilated. Urinary bladder is moderately distended without acute  abnormality. Stomach/Bowel: There is long segment wall thickening with mild surrounding fat stranding of the terminal ileum with area of caliber change in the right lower quadrant (series 2, image 49) and mild distension of the upstream ilium measuring up to 2.9 cm in diameter. Remaining small bowel is within normal limits. Scattered colonic diverticulosis. No pericolonic inflammatory changes. Small hiatal hernia. Vascular/Lymphatic: Atherosclerotic calcification of the aortoiliac axis. No abdominal or pelvic lymphadenopathy identified. Reproductive: Uterus and bilateral adnexa are unremarkable. Other: No abdominal wall hernia or abnormality. No abdominopelvic ascites. No abscess. Musculoskeletal: Grade 1 anterolisthesis L4 on L5. No acute osseous findings. IMPRESSION: 1. Long segment wall thickening and surrounding mild inflammatory changes of the terminal ileum suggesting terminal ileitis, which can be seen in the setting of inflammatory bowel disease. 2. There is focal caliber change proximally with mild distension of the more proximal ileum raising the possibility of stricture. No evidence to suggest small bowel obstruction. Electronically Signed   By: Davina Poke M.D.   On: 05/30/2019 11:25   Dg Chest 2 View  Result Date: 05/16/2019 CLINICAL DATA:  Altered mental status, left-sided deficit beginning at 11 Jasmane Brockway.m. this morning. Left shoulder pain EXAM: CHEST - 2 VIEW COMPARISON:  Numerous priors, most recent March 19, 2019 FINDINGS: Streaky bandlike areas of atelectasis are present right mid lung and left lung base. Postsurgical changes are present the left lung. The lungs are otherwise clear. No pneumothorax. No effusions. Stable appearance of the cardiomediastinal contours including rightward deviation of the trachea. Surgical clips project over the left axilla. No acute osseous or soft tissue abnormality. Postthoracotomy changes with prior left rib resection and remote rib fractures. Surgical clips project  in the left axilla. IMPRESSION: Bilateral atelectasis. No acute cardiopulmonary disease. Electronically Signed   By: Lovena Le M.D.   On: 05/16/2019 16:12   Ct Head Wo Contrast  Result Date: 05/30/2019 CLINICAL DATA:  Altered level of consciousness EXAM: CT HEAD WITHOUT CONTRAST CT CERVICAL SPINE WITHOUT CONTRAST TECHNIQUE: Multidetector CT imaging of the head and cervical spine was performed following the standard protocol without intravenous contrast. Multiplanar CT image reconstructions of the cervical spine were also generated. COMPARISON:  05/24/2019 FINDINGS: CT HEAD FINDINGS Brain: No evidence of acute infarction, hemorrhage, extra-axial collection, ventriculomegaly, or mass effect. Generalized cerebral atrophy. Periventricular white matter low attenuation likely secondary to microangiopathy. Vascular: Cerebrovascular atherosclerotic calcifications are noted. Skull: Negative for fracture or focal lesion. Sinuses/Orbits: Visualized portions of the orbits are unremarkable. Visualized portions of the paranasal sinuses and mastoid air cells  are unremarkable. Other: Scalp contusion along the left vertex. CT CERVICAL SPINE FINDINGS Alignment: Normal. Skull base and vertebrae: No acute fracture. No primary bone lesion or focal pathologic process. Erosion along the anterior aspect of the dens as can be seen with crystalline arthropathy. Soft tissues and spinal canal: No prevertebral fluid or swelling. No visible canal hematoma. Disc levels: Degenerative disc disease with disc height loss at C4-5 and C5-6. Right uncovertebral degenerative changes at C4-5 and C5-6 resulting in foraminal stenosis. Mild left foraminal stenosis at C4-5. Upper chest: Left apical scarring. Other: Bilateral carotid artery atherosclerosis. IMPRESSION: 1. No acute intracranial pathology. 2.  No acute osseous injury of the cervical spine. Electronically Signed   By: Kathreen Devoid   On: 05/30/2019 07:33   Ct Head Wo Contrast  Result  Date: 05/24/2019 CLINICAL DATA:  Worsening headaches. Recent falls. Nausea and emesis today. EXAM: CT HEAD WITHOUT CONTRAST TECHNIQUE: Contiguous axial images were obtained from the base of the skull through the vertex without intravenous contrast. COMPARISON:  Head CT 05/16/2019 FINDINGS: Brain: No intracranial hemorrhage, mass effect, or midline shift. Unchanged degree of atrophy and chronic small vessel ischemia. No hydrocephalus. The basilar cisterns are patent. No evidence of territorial infarct or acute ischemia. No extra-axial or intracranial fluid collection. Vascular: Atherosclerosis of skullbase vasculature without hyperdense vessel or abnormal calcification. Skull: No fracture or focal lesion. Sinuses/Orbits: Minor mucosal thickening of ethmoid air cells. No acute findings. No sinus fluid level. Bilateral lens extraction. Other: None. IMPRESSION: 1. No acute intracranial abnormality. 2. Unchanged atrophy and chronic small vessel ischemia. Electronically Signed   By: Keith Rake M.D.   On: 05/24/2019 16:48   Ct Head Wo Contrast  Result Date: 05/16/2019 CLINICAL DATA:  TIA, multiple falls, hit back of head EXAM: CT HEAD WITHOUT CONTRAST TECHNIQUE: Contiguous axial images were obtained from the base of the skull through the vertex without intravenous contrast. COMPARISON:  CT brain 05/01/2019 FINDINGS: Brain: No acute territorial infarction, hemorrhage or intracranial mass. Atrophy and minimal small vessel ischemic change of the white matter. Stable ventricle size. Vascular: No hyperdense vessels. Vertebral and carotid vascular calcification Skull: Normal. Negative for fracture or focal lesion. Sinuses/Orbits: No acute finding. Mild mucosal thickening in the maxillary and ethmoid sinuses Other: None IMPRESSION: 1. No CT evidence for acute intracranial abnormality. 2. Atrophy and minimal small vessel ischemic changes of the white matter Electronically Signed   By: Donavan Foil M.D.   On: 05/16/2019  17:49   Ct Cervical Spine Wo Contrast  Result Date: 05/30/2019 CLINICAL DATA:  Altered level of consciousness EXAM: CT HEAD WITHOUT CONTRAST CT CERVICAL SPINE WITHOUT CONTRAST TECHNIQUE: Multidetector CT imaging of the head and cervical spine was performed following the standard protocol without intravenous contrast. Multiplanar CT image reconstructions of the cervical spine were also generated. COMPARISON:  05/24/2019 FINDINGS: CT HEAD FINDINGS Brain: No evidence of acute infarction, hemorrhage, extra-axial collection, ventriculomegaly, or mass effect. Generalized cerebral atrophy. Periventricular white matter low attenuation likely secondary to microangiopathy. Vascular: Cerebrovascular atherosclerotic calcifications are noted. Skull: Negative for fracture or focal lesion. Sinuses/Orbits: Visualized portions of the orbits are unremarkable. Visualized portions of the paranasal sinuses and mastoid air cells are unremarkable. Other: Scalp contusion along the left vertex. CT CERVICAL SPINE FINDINGS Alignment: Normal. Skull base and vertebrae: No acute fracture. No primary bone lesion or focal pathologic process. Erosion along the anterior aspect of the dens as can be seen with crystalline arthropathy. Soft tissues and spinal canal: No prevertebral fluid or swelling.  No visible canal hematoma. Disc levels: Degenerative disc disease with disc height loss at C4-5 and C5-6. Right uncovertebral degenerative changes at C4-5 and C5-6 resulting in foraminal stenosis. Mild left foraminal stenosis at C4-5. Upper chest: Left apical scarring. Other: Bilateral carotid artery atherosclerosis. IMPRESSION: 1. No acute intracranial pathology. 2.  No acute osseous injury of the cervical spine. Electronically Signed   By: Kathreen Devoid   On: 05/30/2019 07:33   Dg Chest Portable 1 View  Result Date: 05/30/2019 CLINICAL DATA:  Altered mental status EXAM: PORTABLE CHEST 1 VIEW COMPARISON:  05/16/2019 FINDINGS: The heart size and  mediastinal contours are within normal limits. Both lungs are clear. Old left posterior rib fractures. Surgical clips in the left axilla. IMPRESSION: No active disease. Electronically Signed   By: Kathreen Devoid   On: 05/30/2019 06:57   Dg Shoulder Left  Result Date: 05/16/2019 CLINICAL DATA:  Altered mental status, left-sided deficits, left shoulder pain EXAM: LEFT SHOULDER - 2+ VIEW COMPARISON:  None. FINDINGS: No evidence of fracture or traumatic malalignment. Degenerative changes of the acromioclavicular and lesser changes at the glenohumeral joint. Coracoclavicular and acromioclavicular intervals are maintained. Postsurgical changes are noted in the left axilla as well as postthoracotomy changes prior rib resection and several remote deformities of the left-sided ribs as well as suture material within the left lung. The aorta is calcified. IMPRESSION: 1. No acute osseous abnormality. 2. Mild acromioclavicular and glenohumeral degenerative changes. 3. Post thoracotomy changes left chest wall. Postsurgical changes left axilla. Electronically Signed   By: Lovena Le M.D.   On: 05/16/2019 16:13    Microbiology: Recent Results (from the past 240 hour(s))  SARS Coronavirus 2 [facility] order, Performed in [facility] hospital lab) Nasopharyngeal Nasopharyngeal Swab     Status: None   Collection Time: 05/30/19 12:16 PM   Specimen: Nasopharyngeal Swab  Result Value Ref Range Status   SARS Coronavirus 2 NEGATIVE NEGATIVE Final    Comment: (NOTE) If result is NEGATIVE SARS-CoV-2 target nucleic acids are NOT DETECTED. The SARS-CoV-2 RNA is generally detectable in upper and lower  respiratory specimens during the acute phase of infection. The lowest  concentration of SARS-CoV-2 viral copies this assay can detect is 250  copies / mL. Sherlin Sonier negative result does not preclude SARS-CoV-2 infection  and should not be used as the sole basis for treatment or other  patient management decisions.  Tru Rana negative result  may occur with  improper specimen collection / handling, submission of specimen other  than nasopharyngeal swab, presence of viral mutation(s) within the  areas targeted by this assay, and inadequate number of viral copies  (<250 copies / mL). Rana Hochstein negative result must be combined with clinical  observations, patient history, and epidemiological information. If result is POSITIVE SARS-CoV-2 target nucleic acids are DETECTED. The SARS-CoV-2 RNA is generally detectable in upper and lower  respiratory specimens dur ing the acute phase of infection.  Positive  results are indicative of active infection with SARS-CoV-2.  Clinical  correlation with patient history and other diagnostic information is  necessary to determine patient infection status.  Positive results do  not rule out bacterial infection or co-infection with other viruses. If result is PRESUMPTIVE POSTIVE SARS-CoV-2 nucleic acids MAY BE PRESENT.   Jadene Stemmer presumptive positive result was obtained on the submitted specimen  and confirmed on repeat testing.  While 2019 novel coronavirus  (SARS-CoV-2) nucleic acids may be present in the submitted sample  additional confirmatory testing may be necessary for epidemiological  and / or clinical management purposes  to differentiate between  SARS-CoV-2 and other Sarbecovirus currently known to infect humans.  If clinically indicated additional testing with an alternate test  methodology [phone]) is advised. The SARS-CoV-2 RNA is generally  detectable in upper and lower respiratory sp ecimens during the acute  phase of infection. The expected result is Negative. Fact Sheet for Patients:  StrictlyIdeas.no Fact Sheet for Healthcare Providers: BankingDealers.co.za This test is not yet approved or cleared by the [place] FDA and has been authorized for detection and/or diagnosis of SARS-CoV-2 by FDA under an Emergency Use Authorization (EUA).   This EUA will remain in effect (meaning this test can be used) for the duration of the COVID-19 declaration under Section 564(b)(1) of the Act, 21 U.S.C. section 360bbb-3(b)(1), unless the authorization is terminated or revoked sooner. Performed at [facility], [place] [address], [place], [place] [address]   C difficile quick scan w PCR reflex     Status: None   Collection Time: 06/03/19 12:14 PM   Specimen: STOOL  Result Value Ref Range Status   C Diff antigen NEGATIVE NEGATIVE Final   C Diff toxin NEGATIVE NEGATIVE Final   C Diff interpretation No C. difficile detected.  Final    Comment: Performed at [facility], [place] [address] [place],  [address]  Novel Coronavirus, NAA (hospital order; send-out to ref lab)     Status: None   Collection Time: 06/04/19  6:24 PM   Specimen: Nasopharyngeal Swab; Respiratory  Result Value Ref Range Status   SARS-CoV-2, NAA NOT DETECTED NOT DETECTED Final    Comment: (NOTE) This test was developed and its performance characteristics determined by [employer]. This test has not been FDA cleared or approved. This test has been authorized by FDA under an Emergency Use Authorization (EUA). This test is only authorized for the duration of time the declaration that circumstances exist justifying the authorization of the emergency use of in vitro diagnostic tests for detection of SARS-CoV-2 virus and/or diagnosis of COVID-19 infection under section 564(b)(1) of the Act, 21 U.S.C. 419FXT-0(W)(4), unless the authorization is terminated or revoked sooner. When diagnostic testing is negative, the possibility of [place] false negative result should be considered in the context of Dream Harman patient's recent exposures and the presence of clinical signs and symptoms consistent with COVID-19. An individual without symptoms of COVID-19 and who is not shedding SARS-CoV-2 virus would expect to have Sabrie Moritz negative (not detected)  result in this assay. Performed  At: [facility] [place], [place] [id number] Rush Farmer MD ME:[id number]    [place]  Final    Comment: Performed at [facility] [address] [place], [place] [address]  SARS CORONAVIRUS 2 (TAT 6-12 HRS) Nasal Swab Aptima Multi Swab     Status: None   Collection Time: 06/08/19  1:25 PM   Specimen: Aptima Multi Swab; Nasal Swab  Result Value Ref Range Status   SARS Coronavirus 2 NEGATIVE NEGATIVE Final    Comment: (NOTE) SARS-CoV-2 target nucleic acids are NOT DETECTED. The SARS-CoV-2 RNA is generally detectable in upper and lower respiratory specimens during the acute phase of infection. Negative results do not preclude SARS-CoV-2 infection, do not rule out co-infections with other pathogens, and should not be used as the sole basis for treatment or other patient management decisions. Negative results must be combined with clinical observations, patient history, and epidemiological information. The expected result is Negative. Fact Sheet  for Patients: SugarRoll.be Fact Sheet for Healthcare Providers: https://www.woods-mathews.com/ This test is not yet approved or cleared by the [place] FDA and  has been authorized for detection and/or diagnosis of SARS-CoV-2 by FDA under an Emergency Use Authorization (EUA). This EUA will remain  in effect (meaning this test can be used) for the duration of the COVID-19 declaration under Section 56 4(b)(1) of the Act, 21 U.S.C. section 360bbb-3(b)(1), unless the authorization is terminated or revoked sooner. Performed at [facility], [place] [address] [place], [place] [address]      Labs: Basic Metabolic Panel: Recent Labs  Lab 06/05/19 0557 06/06/19 0536 06/07/19 0501 06/08/19 0513 06/09/19 0831  NA 141 146* 140 139 138  K 3.4* 4.1 2.9* 4.9 3.4*  CL 112* 114* 107 108 106   CO2 23 20* 22 21* 22  GLUCOSE 112* 155* 88 120* 126*  BUN 16 19 16 19  34*  CREATININE 2.25* 2.58* 2.21* 2.24* 2.20*  CALCIUM 7.7* 8.7* 7.9* 8.2* 8.1*  MG  --   --  1.3* 2.0  --    Liver Function Tests: Recent Labs  Lab 06/07/19 0501 06/08/19 0513 06/09/19 0831  AST 13* 13* 15  ALT 8 9 12   ALKPHOS 52 61 63  BILITOT 0.4 0.3 0.4  PROT 5.3* 5.8* 6.4*  ALBUMIN 2.1* 2.3* 2.5*   No results for input(s): LIPASE, AMYLASE in the last 168 hours. No results for input(s): AMMONIA in the last 168 hours. CBC: Recent Labs  Lab 06/04/19 0824 06/06/19 0536 06/07/19 0501 06/08/19 0513 06/09/19 0831  WBC 6.3 6.2 7.8 6.0 9.6  HGB 10.5* 11.3* 8.8* 9.4* 10.5*  HCT 32.2* 35.6* 28.2* 29.8* 32.4*  MCV 94.2 97.3 97.6 97.4 95.3  PLT 332 371 280 268 290   Cardiac Enzymes: No results for input(s): CKTOTAL, CKMB, CKMBINDEX, TROPONINI in the last 168 hours. BNP: BNP (last 3 results) Recent Labs    04/07/19 2157  BNP 294.1*    ProBNP (last 3 results) No results for input(s): PROBNP in the last 8760 hours.  CBG: Recent Labs  Lab 06/08/19 0755 06/08/19 1148 06/08/19 1620 06/08/19 2132 06/09/19 0747  GLUCAP 95 125* 156* 153* 96       Signed:  Fayrene Helper MD.  [facility] 06/09/2019, 11:07 AM

## 2019-06-09 NOTE — TOC Transition Note (Addendum)
Transition of Care [facility]) - CM/SW Discharge Note   Patient Details  Name: Samantha Mckenzie MRN: [id number] Date of Birth: Mar 27, 1936  Transition of [facility]) CM/SW Contact:  Nila Nephew, LCSW Phone Number: [phone] 06/09/2019, 12:03 PM   Clinical Narrative:   Pt admitting to [facility] today room 503-B- report [phone]. Provided DS information and son & husband aware of plan today. Will arrange ambulance transport.    Final next level of care: Skilled Nursing Facility Barriers to Discharge: (none)   Patient Goals and CMS Choice Patient states their goals for this hospitalization and ongoing recovery are:: Rehab at [facility] CMS Medicare.gov Compare Post Acute Care list provided to:: Patient Choice offered to / list presented to : Spouse, Adult Children  Discharge Placement               [facility]        Discharge Plan and Services   Discharge Planning Services: CM Consult Post Acute Care Choice: [place]                               Social Determinants of Health (SDOH) Interventions     Readmission Risk Interventions Readmission Risk Prevention Plan 06/04/2019 05/31/2019 04/10/2019  Transportation Screening Complete Complete Complete  PCP or Specialist Appt within 3-5 Days - - -  HRI or [place] for [place] - - -  Medication Review (RN Care Manager) Complete Referral to Pharmacy Referral to Pharmacy  PCP or Specialist appointment within 3-5 days of discharge Complete Complete Complete  HRI or [place] - Complete Complete  SW Recovery Care/Counseling Consult Complete Complete Complete  Palliative Care Screening Not Applicable Not Applicable Not Applicable  Skilled Nursing Facility - Not Applicable Not Applicable  Some recent data might be hidden

## 2019-06-11 ENCOUNTER — Non-Acute Institutional Stay (SKILLED_NURSING_FACILITY): Payer: Medicare Other | Admitting: Internal Medicine

## 2019-06-11 ENCOUNTER — Encounter: Payer: Self-pay | Admitting: Internal Medicine

## 2019-06-11 DIAGNOSIS — K50019 Crohn's disease of small intestine with unspecified complications: Secondary | ICD-10-CM | POA: Diagnosis not present

## 2019-06-11 DIAGNOSIS — F329 Major depressive disorder, single episode, unspecified: Secondary | ICD-10-CM

## 2019-06-11 DIAGNOSIS — M109 Gout, unspecified: Secondary | ICD-10-CM | POA: Diagnosis not present

## 2019-06-11 DIAGNOSIS — G301 Alzheimer's disease with late onset: Secondary | ICD-10-CM

## 2019-06-11 DIAGNOSIS — E785 Hyperlipidemia, unspecified: Secondary | ICD-10-CM

## 2019-06-11 DIAGNOSIS — R05 Cough: Secondary | ICD-10-CM | POA: Diagnosis not present

## 2019-06-11 DIAGNOSIS — N184 Chronic kidney disease, stage 4 (severe): Secondary | ICD-10-CM | POA: Diagnosis not present

## 2019-06-11 DIAGNOSIS — E118 Type 2 diabetes mellitus with unspecified complications: Secondary | ICD-10-CM

## 2019-06-11 DIAGNOSIS — N179 Acute kidney failure, unspecified: Secondary | ICD-10-CM | POA: Diagnosis not present

## 2019-06-11 DIAGNOSIS — I10 Essential (primary) hypertension: Secondary | ICD-10-CM

## 2019-06-11 DIAGNOSIS — F0280 Dementia in other diseases classified elsewhere without behavioral disturbance: Secondary | ICD-10-CM

## 2019-06-11 DIAGNOSIS — J069 Acute upper respiratory infection, unspecified: Secondary | ICD-10-CM | POA: Diagnosis not present

## 2019-06-11 DIAGNOSIS — D62 Acute posthemorrhagic anemia: Secondary | ICD-10-CM

## 2019-06-11 DIAGNOSIS — F32A Depression, unspecified: Secondary | ICD-10-CM

## 2019-06-11 NOTE — Progress Notes (Signed)
: Provider:  Hennie Duos., MD Location:  [place] Room Number: 506-P Place of Service:  SNF ([phone])  PCP: Colon Branch, MD Patient Care Team: Colon Branch, MD as PCP - General Corliss Parish, MD as Consulting Physician (Nephrology)  Extended Emergency Contact Information Primary Emergency Contact: Ostenson,George C Address: [place], [place] Phone: [phone] Mobile Phone: [phone] Relation: Spouse Secondary Emergency Contact: Lester, Platas Mobile Phone: [phone] Relation: Son     Allergies: Codeine, Morphine, Oxycodone-acetaminophen, Sulfonamide derivatives, and Tramadol hcl  Chief Complaint  Patient presents with   New Admit To SNF    New admission to [facility] SNF     HPI: Patient is an 83 y.o. female with GERD previous terminal ileitis a month ago, diabetes mellitus type 2, diabetic peripheral neuropathy, COPD, chronic diastolic congestive heart failure, breast cancer status post left mastectomy, who presented to [facility] long ED from home with complaints of diffuse abdominal pain, nausea, vomiting, and diarrhea.  This started several days ago associated with abdominal cramping and poor p.o. intake.  Patient admits to frank blood in stool this morning.  Patient was admitted to [facility] for suspected terminal ileitis with lower GI bleed.  With sepsis.  Sepsis resolved, antibiotics discontinued as it was not helping her symptoms.  Colonoscopy showed stricture at the terminal ileum and was negative for acute inflammation or other abnormalities patient had initial work-up for Crohn's disease which was indeterminate and stool for C. difficile was negative patient was transfused 1 unit PRBC for acute on chronic anemia.  Hospital course was also complicated by concern for depression.  Psych was consulted and recommended Lexapro and noted that patient did not seem capable  of making decisions regarding her current medical condition.  All other problems being stable patient is admitted to skilled nursing facility for OT/PT.  While at skilled nursing facility patient will be followed for gout treated with allopurinol, hypertension treated with Norvasc metoprolol and hyperlipidemia treated with Lipitor.    Past Medical History:  Diagnosis Date   Allergic rhinitis    Anemia    Breast CA ([place])    surgery, chemo, XRT; had peripheral neuropathy (imbalance at times) after chemo   Cellulitis    Left arm, recurrent    Cellulitis LEFT arm recurrent 12/08/2011   Chronic renal disease, stage IV ([place])    Dr. Moshe Cipro   Depression    Fatigue    GERD (gastroesophageal reflux disease)    gastritis, EGD 02/2007   Gout    Hyperlipidemia    Hypertension    Macular degeneration 2011   Normal cardiac stress test 12/2014   Osteoarthritis    Renal insufficiency     Past Surgical History:  Procedure Laterality Date   BIOPSY  06/06/2019   Procedure: BIOPSY;  Surgeon: Ladene Artist, MD;  Location: [facility] ENDOSCOPY;  Service: Endoscopy;;   cataracts bilaterally  1 -2012   COLONOSCOPY WITH PROPOFOL N/A 06/06/2019   Procedure: COLONOSCOPY WITH PROPOFOL;  Surgeon: Ladene Artist, MD;  Location: [facility] ENDOSCOPY;  Service: Endoscopy;  Laterality: N/A;   LUNG BIOPSY  1999   neg   MASTECTOMY Left 1999   and lymphnodes     Allergies as of 06/11/2019      Reactions   Codeine Nausea And Vomiting   Morphine Nausea And Vomiting   Oxycodone-acetaminophen Nausea And Vomiting  Sulfonamide Derivatives Nausea And Vomiting   Tramadol Hcl Nausea And Vomiting      Medication List       Accurate as of June 11, 2019 11:14 AM. If you have any questions, ask your nurse or doctor.        acetaminophen 325 MG tablet Commonly known as: TYLENOL Take 2 tablets (650 mg total) by mouth every 6 (six) hours as needed for mild pain, fever or headache (or Fever >/=  101).   allopurinol 100 MG tablet Commonly known as: ZYLOPRIM Take 1 tablet (100 mg total) by mouth daily.   amLODipine 10 MG tablet Commonly known as: NORVASC Take 1 tablet (10 mg total) by mouth daily.   atorvastatin 20 MG tablet Commonly known as: LIPITOR Take 1 tablet (20 mg total) by mouth daily.   CALTRATE 600+D3 PO Take 1 tablet by mouth daily.   CENTRUM SILVER PO Take 1 tablet by mouth daily.   dicyclomine 10 MG capsule Commonly known as: BENTYL Take 2 capsules (20 mg total) by mouth 4 (four) times daily -  before meals and at bedtime.   dronabinol 2.5 MG capsule Commonly known as: Marinol Take 1 capsule (2.5 mg total) by mouth 2 (two) times daily before a meal.   escitalopram 10 MG tablet Commonly known as: Lexapro Take 2 tablets (20 mg total) by mouth daily.   ferrous sulfate 325 (65 FE) MG tablet Take 1 tablet (325 mg total) by mouth daily with breakfast.   loperamide 2 MG tablet Commonly known as: IMODIUM A-D Take 2 mg by mouth 4 (four) times daily as needed for diarrhea or loose stools.   metoprolol tartrate 25 MG tablet Commonly known as: LOPRESSOR Take 1 tablet (25 mg total) by mouth 2 (two) times daily.   mirtazapine 15 MG tablet Commonly known as: REMERON Take 15 mg by mouth at bedtime.   pantoprazole 40 MG tablet Commonly known as: PROTONIX Take 1 tablet (40 mg total) by mouth daily before breakfast.   predniSONE 10 MG tablet Commonly known as: DELTASONE Take 4 tablets (40 mg total) by mouth daily with breakfast for 14 days, THEN 3 tablets (30 mg total) daily with breakfast for 14 days, THEN 2 tablets (20 mg total) daily with breakfast for 14 days, THEN 1.5 tablets (15 mg total) daily with breakfast for 14 days, THEN 1 tablet (10 mg total) daily with breakfast for 14 days. Start taking on: June 09, 2019   vitamin B-12 1000 MCG tablet Commonly known as: CYANOCOBALAMIN Take 1,000 mcg by mouth daily.   vitamin C 500 MG tablet Commonly  known as: ASCORBIC ACID Take 500 mg by mouth daily.   Vitamin D3 25 MCG (1000 UT) Caps Take 1 capsule by mouth daily.       No orders of the defined types were placed in this encounter.   Immunization History  Administered Date(s) Administered   Influenza Split 08/07/2014   Influenza Whole 08/09/2010   Influenza, High Dose Seasonal PF 06/27/2013, 07/11/2015, 07/13/2017, 07/17/2018   Influenza-Unspecified 06/11/2016   Pneumococcal Conjugate-13 10/09/2014   Pneumococcal Polysaccharide-23 10/12/2003, 12/15/2009, 12/24/2016   Td 05/28/2004, 10/09/2014    Social History   Tobacco Use   Smoking status: Never Smoker   Smokeless tobacco: Never Used  Substance Use Topics   Alcohol use: Not Currently    Alcohol/week: 0.0 standard drinks    Comment: rarely    Family history is   Family History  Problem Relation Age of Onset  Breast cancer Other        ? of    Heart attack Father 34   Diabetes Sister        2   Hypertension Sister    Colon cancer Neg Hx    Stomach cancer Neg Hx       Review of Systems  DATA OBTAINED: from patient, nurse GENERAL:  no fevers, fatigue, appetite changes SKIN: No itching, or rash EYES: No eye pain, redness, discharge EARS: No earache, tinnitus, change in hearing NOSE: No congestion, drainage or bleeding  MOUTH/THROAT: No mouth or tooth pain, No sore throat RESPIRATORY: No cough, wheezing, SOB CARDIAC: No chest pain, palpitations, lower extremity edema  GI: No abdominal pain, No V/D or constipation, nausea no heartburn or reflux  GU: No dysuria, frequency or urgency, or incontinence  MUSCULOSKELETAL: No unrelieved bone/joint pain NEUROLOGIC: No headache, dizziness or focal weakness PSYCHIATRIC: No c/o anxiety or sadness   Vitals:   06/11/19 1100  BP: (!) 142/74  Pulse: 80  Resp: 18  Temp: 98 F (36.7 C)  SpO2: 97%    SpO2 Readings from Last 1 Encounters:  06/11/19 97%   Body mass index is 20.16  kg/m.     Physical Exam  GENERAL APPEARANCE: Alert, conversant,  No acute distress.  SKIN: No diaphoresis rash HEAD: Normocephalic, atraumatic  EYES: Conjunctiva/lids clear. Pupils round, reactive. EOMs intact.  EARS: External exam WNL, canals clear. Hearing grossly normal.  NOSE: No deformity or discharge.  MOUTH/THROAT: Lips w/o lesions  RESPIRATORY: Breathing is even, unlabored. Lung sounds are clear   CARDIOVASCULAR: Heart RRR no murmurs, rubs or gallops. No peripheral edema.   GASTROINTESTINAL: Abdomen is soft, non-tender, not distended w/ normal bowel sounds. GENITOURINARY: Bladder non tender, not distended  MUSCULOSKELETAL: No abnormal joints or musculature NEUROLOGIC:  Cranial nerves 2-12 grossly intact. Moves all extremities  PSYCHIATRIC: Mood and affect appropriate to situation, no behavioral issues  Patient Active Problem List   Diagnosis Date Noted   Abnormal CT scan, small bowel    Dehydration 05/31/2019   Acute blood loss anemia    Ileitis 05/30/2019   Terminal ileitis with complication ([place]) 93/26/7124   COPD (chronic obstructive pulmonary disease) ([place]) 04/12/2019   Enteritis 04/07/2019   Chronic diastolic CHF (congestive heart failure) ([place]) 04/07/2019   Hypokalemia 04/07/2019   Hypocalcemia 04/07/2019   Fall 04/07/2019   Occult blood positive stool 04/07/2019   Adjustment disorder with depressed mood    Generalized abdominal pain    Dysphasia 03/21/2019   CAP (community acquired pneumonia) 03/20/2019   Nausea vomiting and diarrhea 03/20/2019   Acute respiratory failure with hypoxia ([place]) 03/20/2019   Acute renal failure superimposed on stage 4 chronic kidney disease ([place]) 03/20/2019   Lactic acidosis 03/20/2019   Diabetic peripheral neuropathy associated with type 2 diabetes mellitus ([place]) 03/20/2019   Hypomagnesemia 06/21/2018   Generalized weakness 06/20/2018   PCP NOTES >>> 07/11/2015   Depression    VITAMIN B12  DEFICIENCY 04/01/2008   GERD 12/19/2007   NEOP, MALIGNANT, FEMALE BREAST NOS 07/07/2007   Osteoarthritis 07/07/2007   Diabetes mellitus type 2 with complications ([place]) 58/06/9832   Hyperlipidemia 02/28/2007   GOUT 02/28/2007   Normocytic anemia 02/28/2007   Essential hypertension 02/28/2007      Labs reviewed: Basic Metabolic Panel:    Component Value Date/Time   NA 138 06/09/2019 0831   NA 139 01/20/2015   K 3.4 (L) 06/09/2019 0831   CL 106 06/09/2019 0831   CO2 22  06/09/2019 0831   GLUCOSE 126 (H) 06/09/2019 0831   BUN 34 (H) 06/09/2019 0831   BUN 39 (A) 01/20/2015   CREATININE 2.20 (H) 06/09/2019 0831   CALCIUM 8.1 (L) 06/09/2019 0831   PROT 6.4 (L) 06/09/2019 0831   ALBUMIN 2.5 (L) 06/09/2019 0831   AST 15 06/09/2019 0831   ALT 12 06/09/2019 0831   ALKPHOS 63 06/09/2019 0831   BILITOT 0.4 06/09/2019 0831   GFRNONAA 20 (L) 06/09/2019 0831   GFRAA 24 (L) 06/09/2019 0831    Recent Labs    05/31/19 0717  06/07/19 0501 06/08/19 0513 06/09/19 0831  NA 139   < > 140 139 138  K 2.9*   < > 2.9* 4.9 3.4*  CL 97*   < > 107 108 106  CO2 32   < > 22 21* 22  GLUCOSE 90   < > 88 120* 126*  BUN 27*   < > 16 19 34*  CREATININE 2.14*   < > 2.21* 2.24* 2.20*  CALCIUM 7.0*   < > 7.9* 8.2* 8.1*  MG 1.9  --  1.3* 2.0  --    < > = values in this interval not displayed.   Liver Function Tests: Recent Labs    06/07/19 0501 06/08/19 0513 06/09/19 0831  AST 13* 13* 15  ALT 8 9 12   ALKPHOS 52 61 63  BILITOT 0.4 0.3 0.4  PROT 5.3* 5.8* 6.4*  ALBUMIN 2.1* 2.3* 2.5*   Recent Labs    05/24/19 1123 05/30/19 0616 05/31/19 0717  LIPASE 57* 64* 53*   Recent Labs    05/30/19 0618  AMMONIA <9*   CBC: Recent Labs    05/16/19 1500  05/30/19 0616  05/31/19 0717  06/07/19 0501 06/08/19 0513 06/09/19 0831  WBC 7.0   < > 10.3  --  6.1   < > 7.8 6.0 9.6  NEUTROABS 5.6  --  9.0*  --  4.0  --   --   --   --   HGB 8.1*   < > 8.8*   < > 6.8*   < > 8.8* 9.4* 10.5*   HCT 24.8*   < > 26.2*   < > 21.4*   < > 28.2* 29.8* 32.4*  MCV 91.9   < > 92.9  --  97.3   < > 97.6 97.4 95.3  PLT 374   < > 483*  --  366   < > 280 268 290   < > = values in this interval not displayed.   Lipid Recent Labs    06/27/18 1222  CHOL 125  HDL 42.60  LDLCALC 55  TRIG 140.0    Cardiac Enzymes: Recent Labs    06/20/18 2129  TROPONINI <0.03   BNP: Recent Labs    04/07/19 2157  BNP 294.1*   Lab Results  Component Value Date   MICROALBUR 0.2 04/01/2008   Lab Results  Component Value Date   HGBA1C 5.2 05/30/2019   Lab Results  Component Value Date   TSH 1.025 03/23/2019   Lab Results  Component Value Date   VITAMINB12 5,300 (H) 04/07/2019   Lab Results  Component Value Date   FOLATE 61.2 04/07/2019   Lab Results  Component Value Date   IRON 17 (L) 04/07/2019   TIBC 168 (L) 04/07/2019   FERRITIN 429 (H) 04/07/2019    Imaging and Procedures obtained prior to SNF admission: Ct Abdomen Pelvis Wo Contrast  Result Date: 05/30/2019  CLINICAL DATA:  Generalized abdominal pain, nausea EXAM: CT ABDOMEN AND PELVIS WITHOUT CONTRAST TECHNIQUE: Multidetector CT imaging of the abdomen and pelvis was performed following the standard protocol without IV contrast. COMPARISON:  04/20/2019 FINDINGS: Lower chest: No acute abnormality. Hepatobiliary: No focal liver abnormality is seen. No gallstones, gallbladder wall thickening, or biliary dilatation. Pancreas: Unremarkable. No pancreatic ductal dilatation or surrounding inflammatory changes. Spleen: Scattered calcified granulomas within the spleen. No acute findings. Adrenals/Urinary Tract: Unremarkable adrenal glands. Bilateral renal cortical atrophy. Stable appearance of bilateral renal cysts. Extrarenal pelvis is noted bilaterally. No hydronephrosis. Ureters nondilated. Urinary bladder is moderately distended without acute abnormality. Stomach/Bowel: There is long segment wall thickening with mild surrounding fat  stranding of the terminal ileum with area of caliber change in the right lower quadrant (series 2, image 49) and mild distension of the upstream ilium measuring up to 2.9 cm in diameter. Remaining small bowel is within normal limits. Scattered colonic diverticulosis. No pericolonic inflammatory changes. Small hiatal hernia. Vascular/Lymphatic: Atherosclerotic calcification of the aortoiliac axis. No abdominal or pelvic lymphadenopathy identified. Reproductive: Uterus and bilateral adnexa are unremarkable. Other: No abdominal wall hernia or abnormality. No abdominopelvic ascites. No abscess. Musculoskeletal: Grade 1 anterolisthesis L4 on L5. No acute osseous findings. IMPRESSION: 1. Long segment wall thickening and surrounding mild inflammatory changes of the terminal ileum suggesting terminal ileitis, which can be seen in the setting of inflammatory bowel disease. 2. There is focal caliber change proximally with mild distension of the more proximal ileum raising the possibility of stricture. No evidence to suggest small bowel obstruction. Electronically Signed   By: Davina Poke M.D.   On: 05/30/2019 11:25   Ct Head Wo Contrast  Result Date: 05/30/2019 CLINICAL DATA:  Altered level of consciousness EXAM: CT HEAD WITHOUT CONTRAST CT CERVICAL SPINE WITHOUT CONTRAST TECHNIQUE: Multidetector CT imaging of the head and cervical spine was performed following the standard protocol without intravenous contrast. Multiplanar CT image reconstructions of the cervical spine were also generated. COMPARISON:  05/24/2019 FINDINGS: CT HEAD FINDINGS Brain: No evidence of acute infarction, hemorrhage, extra-axial collection, ventriculomegaly, or mass effect. Generalized cerebral atrophy. Periventricular white matter low attenuation likely secondary to microangiopathy. Vascular: Cerebrovascular atherosclerotic calcifications are noted. Skull: Negative for fracture or focal lesion. Sinuses/Orbits: Visualized portions of the  orbits are unremarkable. Visualized portions of the paranasal sinuses and mastoid air cells are unremarkable. Other: Scalp contusion along the left vertex. CT CERVICAL SPINE FINDINGS Alignment: Normal. Skull base and vertebrae: No acute fracture. No primary bone lesion or focal pathologic process. Erosion along the anterior aspect of the dens as can be seen with crystalline arthropathy. Soft tissues and spinal canal: No prevertebral fluid or swelling. No visible canal hematoma. Disc levels: Degenerative disc disease with disc height loss at C4-5 and C5-6. Right uncovertebral degenerative changes at C4-5 and C5-6 resulting in foraminal stenosis. Mild left foraminal stenosis at C4-5. Upper chest: Left apical scarring. Other: Bilateral carotid artery atherosclerosis. IMPRESSION: 1. No acute intracranial pathology. 2.  No acute osseous injury of the cervical spine. Electronically Signed   By: Kathreen Devoid   On: 05/30/2019 07:33   Ct Cervical Spine Wo Contrast  Result Date: 05/30/2019 CLINICAL DATA:  Altered level of consciousness EXAM: CT HEAD WITHOUT CONTRAST CT CERVICAL SPINE WITHOUT CONTRAST TECHNIQUE: Multidetector CT imaging of the head and cervical spine was performed following the standard protocol without intravenous contrast. Multiplanar CT image reconstructions of the cervical spine were also generated. COMPARISON:  05/24/2019 FINDINGS: CT HEAD FINDINGS Brain: No  evidence of acute infarction, hemorrhage, extra-axial collection, ventriculomegaly, or mass effect. Generalized cerebral atrophy. Periventricular white matter low attenuation likely secondary to microangiopathy. Vascular: Cerebrovascular atherosclerotic calcifications are noted. Skull: Negative for fracture or focal lesion. Sinuses/Orbits: Visualized portions of the orbits are unremarkable. Visualized portions of the paranasal sinuses and mastoid air cells are unremarkable. Other: Scalp contusion along the left vertex. CT CERVICAL SPINE FINDINGS  Alignment: Normal. Skull base and vertebrae: No acute fracture. No primary bone lesion or focal pathologic process. Erosion along the anterior aspect of the dens as can be seen with crystalline arthropathy. Soft tissues and spinal canal: No prevertebral fluid or swelling. No visible canal hematoma. Disc levels: Degenerative disc disease with disc height loss at C4-5 and C5-6. Right uncovertebral degenerative changes at C4-5 and C5-6 resulting in foraminal stenosis. Mild left foraminal stenosis at C4-5. Upper chest: Left apical scarring. Other: Bilateral carotid artery atherosclerosis. IMPRESSION: 1. No acute intracranial pathology. 2.  No acute osseous injury of the cervical spine. Electronically Signed   By: Kathreen Devoid   On: 05/30/2019 07:33   Dg Chest Portable 1 View  Result Date: 05/30/2019 CLINICAL DATA:  Altered mental status EXAM: PORTABLE CHEST 1 VIEW COMPARISON:  05/16/2019 FINDINGS: The heart size and mediastinal contours are within normal limits. Both lungs are clear. Old left posterior rib fractures. Surgical clips in the left axilla. IMPRESSION: No active disease. Electronically Signed   By: Kathreen Devoid   On: 05/30/2019 06:57     Not all labs, radiology exams or other studies done during hospitalization come through on my EPIC note; however they are reviewed by me.    Assessment and Plan  Sepsis/terminal ileitis- antibiotics were discontinued as they were not helping; colonoscopy showed a stricture in the terminal ileum with biopsy results being negative for inflammation or other abnormalities; GI followed and recommended Solu-Medrol and a steroid taper; concern for Crohn's with elevated sed rate but TB QuantiFERON was indeterminate still was negative for C. difficile GI panel was DC'd plan for steroid taper at [facility] SNF admitted for OT/PT; plan very slow steroid taper, 40 mg daily for 2 weeks 30 mg daily for 2 weeks 20 mg daily for 2 weeks 15 mg daily for 2 weeks and 10 mg daily for 2  weeks; patient will be follow-up with GI  Acute blood loss anemia- baseline hemoglobin 8.5 dropped to 6.8, patient was transfused with 1 unit PRBCs; patient was started on p.o. on iron SNF- we will follow-up CBC; continue ferrous sulfate 325 1 p.o. daily   Acute on chronic kidney disease stage IV- treated with IV fluid and resolved SNF- follow-up BMP  Type 2 diabetes, controlled-A1c of 5.2 SNF-follow intermittently while on steroids  Depression/frontotemporal dementia-patient admitted feeling depressed for the last several weeks along with having frequent dizzy spells and headaches she was on Lexapro and her dose was recently increased by her PCP.  Psych was consulted and recommended Lexapro 20 mg daily.  Importantly, they noted that she does not demonstrate capacity make decisions regarding her current medical conditions and this was discussed with her son. SNF- continue Lexapro 20 mg dail and Remeron 15 mg nightly y; continue supportive care; consult son with all important decisions  Gout SNF-not active at this time; continue allopurinol 100 mg daily  Hypertension SNF-controlled; continue Norvasc 10 mg daily and Lopressor 25 mg twice daily  Hyperlipidemia SNF- not stated as uncontrolled; continue Lipitor 20 mg daily   Hypertension hyperlipidemia Time spent greater than 45 minutes;>  50% of time with patient was spent reviewing records, labs, tests and studies, counseling and developing plan of care  Hennie Duos, MD

## 2019-06-14 ENCOUNTER — Non-Acute Institutional Stay (SKILLED_NURSING_FACILITY): Payer: Medicare Other | Admitting: Internal Medicine

## 2019-06-14 ENCOUNTER — Non-Acute Institutional Stay: Payer: Medicare Other | Admitting: Licensed Clinical Social Worker

## 2019-06-14 ENCOUNTER — Other Ambulatory Visit: Payer: Self-pay

## 2019-06-14 DIAGNOSIS — D62 Acute posthemorrhagic anemia: Secondary | ICD-10-CM | POA: Diagnosis not present

## 2019-06-14 DIAGNOSIS — E876 Hypokalemia: Secondary | ICD-10-CM

## 2019-06-14 DIAGNOSIS — N184 Chronic kidney disease, stage 4 (severe): Secondary | ICD-10-CM

## 2019-06-14 DIAGNOSIS — N179 Acute kidney failure, unspecified: Secondary | ICD-10-CM | POA: Diagnosis not present

## 2019-06-14 DIAGNOSIS — Z515 Encounter for palliative care: Secondary | ICD-10-CM

## 2019-06-14 LAB — CBC AND DIFFERENTIAL
HCT: 26 — AB (ref 36–46)
Hemoglobin: 8.7 — AB (ref 12.0–16.0)
Neutrophils Absolute: 3
Platelets: 212 (ref 150–399)
WBC: 5.1

## 2019-06-14 LAB — BASIC METABOLIC PANEL
BUN: 50 — AB (ref 4–21)
Creatinine: 2.5 — AB (ref 0.5–1.1)
Glucose: 93
Potassium: 3.3 — AB (ref 3.4–5.3)
Sodium: 140 (ref 137–147)

## 2019-06-14 NOTE — Progress Notes (Addendum)
This is an acute visit.  Level care skilled.  Facility [facility].  Chief complaint acute visit follow-up labs showing mild hypokalemia and hypocalcemia.  History of present illness.  Patient is a pleasant 83 year old female who is here for rehab- She has a history of GERD as well as prior history of focal enteritis versus irritable bowel syndrome as well as type 2 diabetes with peripheral neuropathy COPD diastolic CHF breast cancer status post left mastectomy.  She was recently hospitalized after coming to the ER with abdominal pain nausea vomiting diarrhea and frank blood in stool also poor p.o. intake.  She was found to be septic with hypokalemia and acute kidney injury hypertensive.  CT of the abdomen showed terminal ileitis and she was admitted to the hospital on empiric antibiotics.  Colonoscopy had negative biopsies but stricture was seen in the terminal ileum- there was concern for Crohn's disease with an elevated sed rate-she was started on IV steroids as well and did improve-she continues on a steroid taper with outpatient follow-up by GI  She also had acute blood loss anemia and did require transfusion her hemoglobin was down to 6.8 but improved with 1 unit of packed red blood cells her baseline hemoglobin apparently is around 8.5 it is 8.7 on lab done today.  She is on iron.  She also has acute on chronic kidney disease with suspected dehydration on presentation to the hospital because of GI bleed- she did get IV fluids and improved creatinine today is 2.54 it appears on hospital discharge her creatinine was in the lower 2 area.  She also had low magnesium and potassium which improved-there is recommendation to be cautious with replacing any potassium secondary to her chronic kidney disease.--Potassium appears to be 3.4 on discharge  Currently apparently she is eating and drinking better she appears to be in better spirits per nursing she is participating with therapy  and is motivated to try to get home.  Lab today did show her calcium is somewhat low at 7.4 it has been trending somewhat low in the hospital in the sevens to 8's  area.  She is on Caltrate once a day  Her potassium is slightly low at 3.3 on discharge lab at hospital it was 3.4 again creatinine is 2.54 which is up slightly from what it was on hospital discharge.  At 2.2   Past Medical History:  Diagnosis Date  . Allergic rhinitis   . Anemia   . Breast CA ([facility])    surgery, chemo, XRT; had peripheral neuropathy (imbalance at times) after chemo  . Cellulitis    Left arm, recurrent   . Cellulitis LEFT arm recurrent 12/08/2011  . Chronic renal disease, stage IV ([facility])    Dr. Moshe Cipro  . Depression   . Fatigue   . GERD (gastroesophageal reflux disease)    gastritis, EGD 02/2007  . Gout   . Hyperlipidemia   . Hypertension   . Macular degeneration 2011  . Normal cardiac stress test 12/2014  . Osteoarthritis   . Renal insufficiency          Past Surgical History:  Procedure Laterality Date  . BIOPSY  06/06/2019   Procedure: BIOPSY;  Surgeon: Ladene Artist, MD;  Location: [facility];  Service: Endoscopy;;  . cataracts bilaterally  1 -2012  . COLONOSCOPY WITH PROPOFOL N/A 06/06/2019   Procedure: COLONOSCOPY WITH PROPOFOL;  Surgeon: Ladene Artist, MD;  Location: [facility];  Service: Endoscopy;  Laterality: N/A;  .  LUNG BIOPSY  1999   neg  . MASTECTOMY Left 1999   and lymphnodes          Allergies as of 06/11/2019      Reactions   Codeine Nausea And Vomiting   Morphine Nausea And Vomiting   Oxycodone-acetaminophen Nausea And Vomiting   Sulfonamide Derivatives Nausea And Vomiting   Tramadol Hcl Nausea And Vomiting         Medication List               acetaminophen 325 MG tablet Commonly known as: TYLENOL Take 2 tablets (650 mg total) by mouth every 6 (six) hours as needed for mild pain, fever or headache (or Fever >/=  101).   allopurinol 100 MG tablet Commonly known as: ZYLOPRIM Take 1 tablet (100 mg total) by mouth daily.   amLODipine 10 MG tablet Commonly known as: NORVASC Take 1 tablet (10 mg total) by mouth daily.   atorvastatin 20 MG tablet Commonly known as: LIPITOR Take 1 tablet (20 mg total) by mouth daily.   CALTRATE 600+D3 PO Take 1 tablet by mouth daily.   CENTRUM SILVER PO Take 1 tablet by mouth daily.   dicyclomine 10 MG capsule Commonly known as: BENTYL Take 2 capsules (20 mg total) by mouth 4 (four) times daily -  before meals and at bedtime.   dronabinol 2.5 MG capsule Commonly known as: Marinol Take 1 capsule (2.5 mg total) by mouth 2 (two) times daily before a meal.   escitalopram 10 MG tablet Commonly known as: Lexapro Take 2 tablets (20 mg total) by mouth daily.   ferrous sulfate 325 (65 FE) MG tablet Take 1 tablet (325 mg total) by mouth daily with breakfast.   loperamide 2 MG tablet Commonly known as: IMODIUM A-D Take 2 mg by mouth 4 (four) times daily as needed for diarrhea or loose stools.   metoprolol tartrate 25 MG tablet Commonly known as: LOPRESSOR Take 1 tablet (25 mg total) by mouth 2 (two) times daily.   mirtazapine 15 MG tablet Commonly known as: REMERON Take 15 mg by mouth at bedtime.   pantoprazole 40 MG tablet Commonly known as: PROTONIX Take 1 tablet (40 mg total) by mouth daily before breakfast.   predniSONE 10 MG tablet Commonly known as: DELTASONE Take 4 tablets (40 mg total) by mouth daily with breakfast for 14 days, THEN 3 tablets (30 mg total) daily with breakfast for 14 days, THEN 2 tablets (20 mg total) daily with breakfast for 14 days, THEN 1.5 tablets (15 mg total) daily with breakfast for 14 days, THEN 1 tablet (10 mg total) daily with breakfast for 14 days. Start taking on: June 09, 2019   vitamin B-12 1000 MCG tablet Commonly known as: CYANOCOBALAMIN Take 1,000 mcg by mouth daily.   vitamin C 500 MG  tablet Commonly known as: ASCORBIC ACID Take 500 mg by mouth daily.   Vitamin D3 25 MCG (1000 UT) Caps Take 1 capsule by mouth daily.        Zofran 4 mg before meals and at at bedtime         Immunization History  Administered Date(s) Administered  . Influenza Split 08/07/2014  . Influenza Whole 08/09/2010  . Influenza, High Dose Seasonal PF 06/27/2013, 07/11/2015, 07/13/2017, 07/17/2018  . Influenza-Unspecified 06/11/2016  . Pneumococcal Conjugate-13 10/09/2014  . Pneumococcal Polysaccharide-23 10/12/2003, 12/15/2009, 12/24/2016  . Td 05/28/2004, 10/09/2014    Social History        Tobacco Use  .  Smoking status: Never Smoker  . Smokeless tobacco: Never Used  Substance Use Topics  . Alcohol use: Not Currently    Alcohol/week: 0.0 standard drinks    Comment: rarely    Family history is        Family History  Problem Relation Age of Onset  . Breast cancer Other        ? of   . Heart attack Father 35  . Diabetes Sister        2  . Hypertension Sister   . Colon cancer Neg Hx   . Stomach cancer Neg Hx      Review of systems.  In general she is not complaining of any fever or chills.  Skin does not complain of rashes or itching.  Head ears eyes nose mouth and throat does not complain of visual changes or sore throat.  Respiratory is not complaining of shortness of breath or cough.  Cardiac is not complaining of chest pain or edema.  GI at times still has some abdominal soreness- apparently nausea has improved with addition of Zofran does not complain of vomiting today diarrhea or constipation.  GU is not complaining of dysuria.  Musculoskeletal positive for weakness but does not really complain of joint pain.  Neurologic positive for weakness does not complain of syncope dizziness or headache.  Psych does not complain of being overtly depressed or anxious appears to be in good spirits apparently there were some cognitive issues  in the hospital.  Physical exam.  Temperature is 97.7 pulse 68 respirations 18 blood pressure 132/72.  In general this is a pleasant elderly female in no distress resting comfortably in bed.  Her skin is warm and dry.  Eyes visual acuity appears grossly intact sclera and conjunctive are clear.  Oropharynx is clear mucous membranes moist.  Chest is clear to auscultation there is no labored breathing.  Heart is regular rate and rhythm without murmur gallop or rub she does not have significant lower extremity edema    Abdomen is soft there is some mild diffuse tenderness to palpation-bowel sounds are positive.  Musculoskeletal Limited exam since she is in bed but is able to move all extremities x4 it appears does have some lower extremity weakness.  Neurologic as noted above a could not appreciate lateralizing findings her speech is clear.  Psych she is alert pleasant and appropriate.  Labs.  June 14, 2019.  WBC 5.1 hemoglobin 8.7 platelets 212.  Sodium 140 potassium 3.3 BUN 50.1 creatinine 2.54.  Calcium was 7.4  June 09, 2019.  Sodium 138 potassium 3.4 BUN 34 creatinine 2.20  Calcium was 8.1 has ranged from 7-8.2 in the hospital.  WBC 9.6 hemoglobin 10.5 platelets are 290  Assessment and plan.  1.  Mild hypokalemia potassium is 3.3- there is recommendation to be cautious with calcium with her renal issues- I did discuss this with her fairly extensively at bedside and she really would prefer not to take potassium since she has been advised about trying to avoid this secondary to hyperkalemia concerns--at this point will continue to monitor-she will need close monitoring of her renal function and electrolytes.  2.  History of low calcium she is on Caltrate once a day will increase this to twice daily and again this will need follow-up labs as well.  3.  Anemia appears to have some chronicity to this with her renal issues she did receive transfusion of 1 unit of  packed red blood cells in the hospital  because of GI blood loss- hemoglobin appears to be at relative baseline today at 8.7--it was as high as 10.4 in the hospital but I suspect this reflects some boost from the transfusion her baseline apparently is 8.5 this will need continued monitoring.  This plan was discussed with Dr. Sheppard Coil via phone  [phone]  ADDENDUM--- in regards to diagnosis 1-- mild hypokalemia-- recommendation is to be cautious with her potassium--original dictation as noted above said calcium that should be potassium instead.Marland KitchenMarland Kitchen

## 2019-06-14 NOTE — Progress Notes (Signed)
[facility] SW NOTE  PATIENT NAME: Samantha Mckenzie DOB: November 28, 1935 MRN: [id number]  PRIMARY CARE PROVIDER: Colon Branch, MD  RESPONSIBLE PARTY:  Acct ID - Guarantor Home Phone Work Phone Relationship Acct Type  [id number] Samantha Mckenzie, [phone]  Self P/F     [place], [place], [place] [address]   Due to the COVID-19 crisis, this virtual check-in visit was done via telephone from my office and it was initiated and consent given by this patient and or family.  PLAN OF CARE and INTERVENTIONS:             1. GOALS OF CARE/ ADVANCE CARE PLANNING:  Goal is for patient to return home per the facility.  She is a DNR. 2. SOCIAL/EMOTIONAL/SPIRITUAL ASSESSMENT/ INTERVENTIONS:  SW conducted a virtual check in visit with patient's RN, Dutch Quint, at [facility].  She said patient was admitted on 8/30.  Patient would not eat or drink and would not get out of bed.  Patient was nauseous and the MD scheduled medication instead of PRN per [facility].  Patient displayed improvement shortly after the adjustment.  She is doing all of her own ADLs, is ambulating with her walker and is eating and drinking.  RN did not have a discharge date. 3. PATIENT/CAREGIVER EDUCATION/ COPING:  Patient copes by expressing her feelings openly. 4. PERSONAL EMERGENCY PLAN:  Per facility protocol. 5. COMMUNITY RESOURCES COORDINATION/ HEALTH CARE NAVIGATION:  None. 6. FINANCIAL/LEGAL CONCERNS/INTERVENTIONS:  None.     SOCIAL HX:  Social History   Tobacco Use  . Smoking status: Never Smoker  . Smokeless tobacco: Never Used  Substance Use Topics  . Alcohol use: Not Currently    Alcohol/week: 0.0 standard drinks    Comment: rarely    CODE STATUS: DNR  ADVANCED DIRECTIVES: N MOST FORM COMPLETE: N HOSPICE EDUCATION PROVIDED: N PPS:  Her appetite is normal.  She uses a walker to ambulate. Duration of visit and documentation:  30 minutes.      Creola Corn Hershy Flenner, LCSW

## 2019-06-15 ENCOUNTER — Encounter: Payer: Self-pay | Admitting: Internal Medicine

## 2019-06-17 ENCOUNTER — Encounter: Payer: Self-pay | Admitting: Internal Medicine

## 2019-06-17 DIAGNOSIS — F0390 Unspecified dementia without behavioral disturbance: Secondary | ICD-10-CM | POA: Insufficient documentation

## 2019-06-19 LAB — CALPROTECTIN, FECAL: Calprotectin, Fecal: 1121 ug/g — ABNORMAL HIGH (ref 0–120)

## 2019-06-21 ENCOUNTER — Non-Acute Institutional Stay (SKILLED_NURSING_FACILITY): Payer: Medicare Other | Admitting: Internal Medicine

## 2019-06-21 ENCOUNTER — Encounter: Payer: Self-pay | Admitting: Internal Medicine

## 2019-06-21 DIAGNOSIS — E876 Hypokalemia: Secondary | ICD-10-CM | POA: Diagnosis not present

## 2019-06-21 DIAGNOSIS — K50019 Crohn's disease of small intestine with unspecified complications: Secondary | ICD-10-CM | POA: Diagnosis not present

## 2019-06-21 DIAGNOSIS — D649 Anemia, unspecified: Secondary | ICD-10-CM

## 2019-06-21 DIAGNOSIS — R635 Abnormal weight gain: Secondary | ICD-10-CM | POA: Diagnosis not present

## 2019-06-21 NOTE — Progress Notes (Signed)
Location:  [facility] Room Number: 506-P Place of Service:  SNF [phone]) Provider:  Norma Fredrickson, MD  Patient Care Team: Colon Branch, MD as PCP - General Corliss Parish, MD as Consulting Physician (Nephrology)  Extended Emergency Contact Information Primary Emergency Contact: Steinhart,George C Address: [address], [place] Phone: [phone] Mobile Phone: [phone] Relation: Spouse Secondary Emergency Contact: Claribel, Sachs Mobile Phone: [phone] Relation: Son  Code Status:  DNR Goals of care: Advanced Directive information Advanced Directives 06/21/2019  Does Patient Have a Medical Advance Directive? Yes  Type of Advance Directive Out of facility DNR (pink MOST or yellow form);Healthcare Power of Attorney  Does patient want to make changes to medical advance directive? No - Patient declined  Copy of [place] in Chart? Yes - validated most recent copy scanned in chart (See row information)  Would patient like information on creating a medical advance directive? No - Patient declined  Pre-existing out of facility DNR order (yellow form or pink MOST form) Yellow form placed in chart (order not valid for inpatient use)     Chief Complaint  Patient presents with   Acute Visit    Patient is seen for weight gain of 5+ pounds    HPI:  Pt is an 83 y.o. female seen today for an acute visit for weight gain  She was recently admitted here from the hospital and she is here for rehab.  She was hospitalized after complaining of abdominal pain with nausea and vomiting diarrhea and blood in her stool and poor p.o. intake.  She was septic with hypokalemia and acute kidney injury-CT of the abdomen did show a terminal ileitis and she was admitted to the hospital and treated with antibiotics aggressively  Colonoscopy had negative biopsies but the stricture was seen in the terminal  ileum- there was concern for Crohn's disease with an elevated sed rate she was started on IV steroids she continues on a steroid taper.  She does have follow-up with GI.  She also had acute blood loss anemia did require transfusion- hemoglobin was down to 6.8 it did improve with a transfusion of 1 unit of packed red blood cells-her baseline hemoglobin apparently is in the mid eights it was 8.7 on lab done last week will have this updated.  She is on iron.  Dietary did notify us the patient had about a 5 pound weight gain over the past week but this actually appears to be a positive sign-her appetite was extremely poor but has picked up she is on supplementation she continues on Remeron as well as Marinol for appetite stimulation and continues on Bentyl  for her GI issues--- she also now has Zofran routinely which apparently is helping with her p.o. intake and lessening her nausea  She also has a history of a acute on chronic kidney disease with suspected dehydration presentation the hospital because of GI bleed.  She got IV fluids and creatinine did improve creatinine last week was 2.54 it appears on hospital discharge creatinine was more in the lower 2 areas- potassium also was slightly low at 3.3 on lab done last week it was 3.4 on hospital discharge and she has been hesitant to take potassium because of her renal issues so we have not supplemented this with this will warrant updating as well-there is recommendation to be quite cautious with adding any potassium  because of her kidney function.     Currently she is sitting in her room comfortably she appears to be getting stronger and feeling better she is eating significantly better per nursing staff and I feel this is probably where her weight gain is coming from  Of note on lab last week her calcium was somewhat low at 7.4 and we did increase her Caltrate to twice daily  Past Medical History:  Diagnosis Date   Allergic rhinitis     Anemia    Breast CA ([place])    surgery, chemo, XRT; had peripheral neuropathy (imbalance at times) after chemo   Cellulitis    Left arm, recurrent    Cellulitis LEFT arm recurrent 12/08/2011   Chronic renal disease, stage IV ([place])    Dr. Moshe Cipro   Depression    Fatigue    GERD (gastroesophageal reflux disease)    gastritis, EGD 02/2007   Gout    Hyperlipidemia    Hypertension    Macular degeneration 2011   Normal cardiac stress test 12/2014   Osteoarthritis    Renal insufficiency    Past Surgical History:  Procedure Laterality Date   BIOPSY  06/06/2019   Procedure: BIOPSY;  Surgeon: Ladene Artist, MD;  Location: [facility];  Service: Endoscopy;;   cataracts bilaterally  1 -2012   COLONOSCOPY WITH PROPOFOL N/A 06/06/2019   Procedure: COLONOSCOPY WITH PROPOFOL;  Surgeon: Ladene Artist, MD;  Location: [facility];  Service: Endoscopy;  Laterality: N/A;   LUNG BIOPSY  1999   neg   MASTECTOMY Left 1999   and lymphnodes     Allergies  Allergen Reactions   Codeine Nausea And Vomiting   Morphine Nausea And Vomiting   Oxycodone-Acetaminophen Nausea And Vomiting   Sulfonamide Derivatives Nausea And Vomiting   Tramadol Hcl Nausea And Vomiting    Outpatient Encounter Medications as of 06/21/2019  Medication Sig   acetaminophen (TYLENOL) 325 MG tablet Take 2 tablets (650 mg total) by mouth every 6 (six) hours as needed for mild pain, fever or headache (or Fever >/= 101).   allopurinol (ZYLOPRIM) 100 MG tablet Take 1 tablet (100 mg total) by mouth daily.   amLODipine (NORVASC) 10 MG tablet Take 1 tablet (10 mg total) by mouth daily.   atorvastatin (LIPITOR) 20 MG tablet Take 1 tablet (20 mg total) by mouth daily.   Calcium Carb-Cholecalciferol (CALTRATE 600+D3 PO) Take 1 tablet by mouth 2 (two) times daily.    Cholecalciferol (VITAMIN D3) 1000 UNITS CAPS Take 1 capsule by mouth daily.    dicyclomine (BENTYL) 10 MG capsule Take 2 capsules (20 mg  total) by mouth 4 (four) times daily -  before meals and at bedtime.   dronabinol (MARINOL) 2.5 MG capsule Take 1 capsule (2.5 mg total) by mouth 2 (two) times daily before a meal.   escitalopram (LEXAPRO) 10 MG tablet Take 2 tablets (20 mg total) by mouth daily.   ferrous sulfate 325 (65 FE) MG tablet Take 1 tablet (325 mg total) by mouth daily with breakfast.   loperamide (IMODIUM A-D) 2 MG tablet Take 2 mg by mouth 4 (four) times daily as needed for diarrhea or loose stools.   metoprolol tartrate (LOPRESSOR) 25 MG tablet Take 1 tablet (25 mg total) by mouth 2 (two) times daily.   mirtazapine (REMERON) 15 MG tablet Take 15 mg by mouth at bedtime.   Multiple Vitamins-Minerals (CENTRUM SILVER PO) Take 1 tablet by mouth daily.    Nutritional Supplements (FEEDING SUPPLEMENT,  BOOST BREEZE,) LIQD Take 237 mLs by mouth daily.   ondansetron (ZOFRAN) 4 MG tablet Take 4 mg by mouth 4 (four) times daily.   pantoprazole (PROTONIX) 40 MG tablet Take 1 tablet (40 mg total) by mouth daily before breakfast.   predniSONE (DELTASONE) 10 MG tablet Take 4 tablets (40 mg total) by mouth daily with breakfast for 14 days, THEN 3 tablets (30 mg total) daily with breakfast for 14 days, THEN 2 tablets (20 mg total) daily with breakfast for 14 days, THEN 1.5 tablets (15 mg total) daily with breakfast for 14 days, THEN 1 tablet (10 mg total) daily with breakfast for 14 days.   vitamin B-12 (CYANOCOBALAMIN) 1000 MCG tablet Take 1,000 mcg by mouth daily.     vitamin C (ASCORBIC ACID) 500 MG tablet Take 500 mg by mouth daily.   No facility-administered encounter medications on file as of 06/21/2019.     Review of Systems   General she is not complaining of any fever or chills.  Skin does not complain of rashes or itching.  Head ears eyes nose mouth and throat does not complain of visual changes or sore throat.  Respiratory does not complain of shortness of breath or cough.  Cardiac not complaining of  chest pain she has trace edema.  GI does not complain at this time of nausea or vomiting apparently have any issue but Zofran is helping-says she still has some abdominal discomfort intermittent after eating she says this is been the case since she was in the hospital but has not gotten any worse. Apparently this subsides after a while.  GU is not complaining of any dysuria.  Musculoskeletal has weakness but not really complaining of joint pain currently.  Neurologic is positive for weakness but does not complain of dizziness headache or numbness.  And psych appears to be in somewhat better spirits appears to be getting stronger does not complain of being overtly depressed or anxious     Immunization History  Administered Date(s) Administered   Influenza Split 08/07/2014   Influenza Whole 08/09/2010   Influenza, High Dose Seasonal PF 06/27/2013, 07/11/2015, 07/13/2017, 07/17/2018   Influenza-Unspecified 06/11/2016   Pneumococcal Conjugate-13 10/09/2014   Pneumococcal Polysaccharide-23 10/12/2003, 12/15/2009, 12/24/2016   Td 05/28/2004, 10/09/2014   Pertinent  Health Maintenance Due  Topic Date Due   FOOT EXAM  07/13/2018   OPHTHALMOLOGY EXAM  04/27/2019   INFLUENZA VACCINE  07/12/2019 (Originally 05/12/2019)   HEMOGLOBIN A1C  11/30/2019   DEXA SCAN  Completed   PNA vac Low Risk Adult  Completed   Fall Risk  06/13/2018 09/10/2016 07/11/2015 10/09/2014 05/30/2013  Falls in the past year? No No No No No   Functional Status Survey:    Vitals:   06/21/19 1452  BP: 135/70  Pulse: 70  Resp: 18  Temp: 98 F (36.7 C)  TempSrc: Oral  SpO2: 97%  Weight: 113 lb 12.8 oz (51.6 kg)  Height: 5' 3"  (1.6 m)   Body mass index is 20.16 kg/m. Physical Exam   In general this is a pleasant elderly female in no distress sitting comfortably in her chair.  Her skin is warm and dry.  Eyes visual acuity appears grossly intact sclera and conjunctive are clear.  Oropharynx is  clear mucous membranes moist.  Chest is clear to auscultation there is no labored breathing.  Heart is regular rate and rhythm without murmur gallop or rub she has very mild trace lower extremity edema this may be more dependent related  since she is sitting up.  Abdomen is soft not acutely tender she says there is some soreness  mid abdomen area with palpitation but this is not new since her hospitalization  Musculoskeletal is able to move all extremities x4.  He is doing strength per nursing.  Neurologic as noted above cannot really appreciate lateralizing findings her speech is clear.  Psych  she is pleasant and appropriate    Labs reviewed: June 14, 2019.  WBC 5.1 hemoglobin 8.7 platelets 212.  Sodium 140 potassium 3.3 BUN 50.1 creatinine 2.54.  Calcium was 7.4  Recent Labs    05/31/19 0717  06/07/19 0501 06/08/19 0513 06/09/19 0831  NA 139   < > 140 139 138  K 2.9*   < > 2.9* 4.9 3.4*  CL 97*   < > 107 108 106  CO2 32   < > 22 21* 22  GLUCOSE 90   < > 88 120* 126*  BUN 27*   < > 16 19 34*  CREATININE 2.14*   < > 2.21* 2.24* 2.20*  CALCIUM 7.0*   < > 7.9* 8.2* 8.1*  MG 1.9  --  1.3* 2.0  --    < > = values in this interval not displayed.   Recent Labs    06/07/19 0501 06/08/19 0513 06/09/19 0831  AST 13* 13* 15  ALT 8 9 12   ALKPHOS 52 61 63  BILITOT 0.4 0.3 0.4  PROT 5.3* 5.8* 6.4*  ALBUMIN 2.1* 2.3* 2.5*   Recent Labs    05/16/19 1500  05/30/19 0616  05/31/19 0717  06/07/19 0501 06/08/19 0513 06/09/19 0831  WBC 7.0   < > 10.3  --  6.1   < > 7.8 6.0 9.6  NEUTROABS 5.6  --  9.0*  --  4.0  --   --   --   --   HGB 8.1*   < > 8.8*   < > 6.8*   < > 8.8* 9.4* 10.5*  HCT 24.8*   < > 26.2*   < > 21.4*   < > 28.2* 29.8* 32.4*  MCV 91.9   < > 92.9  --  97.3   < > 97.6 97.4 95.3  PLT 374   < > 483*  --  366   < > 280 268 290   < > = values in this interval not displayed.   Lab Results  Component Value Date   TSH 1.025 03/23/2019   Lab Results    Component Value Date   HGBA1C 5.2 05/30/2019   Lab Results  Component Value Date   CHOL 125 06/27/2018   HDL 42.60 06/27/2018   LDLCALC 55 06/27/2018   LDLDIRECT 71.0 11/19/2015   TRIG 140.0 06/27/2018   CHOLHDL 3 06/27/2018    Significant Diagnostic Results in last 30 days:  Ct Abdomen Pelvis Wo Contrast  Result Date: 05/30/2019 CLINICAL DATA:  Generalized abdominal pain, nausea EXAM: CT ABDOMEN AND PELVIS WITHOUT CONTRAST TECHNIQUE: Multidetector CT imaging of the abdomen and pelvis was performed following the standard protocol without IV contrast. COMPARISON:  04/20/2019 FINDINGS: Lower chest: No acute abnormality. Hepatobiliary: No focal liver abnormality is seen. No gallstones, gallbladder wall thickening, or biliary dilatation. Pancreas: Unremarkable. No pancreatic ductal dilatation or surrounding inflammatory changes. Spleen: Scattered calcified granulomas within the spleen. No acute findings. Adrenals/Urinary Tract: Unremarkable adrenal glands. Bilateral renal cortical atrophy. Stable appearance of bilateral renal cysts. Extrarenal pelvis is noted bilaterally. No hydronephrosis. Ureters nondilated. Urinary bladder is  moderately distended without acute abnormality. Stomach/Bowel: There is long segment wall thickening with mild surrounding fat stranding of the terminal ileum with area of caliber change in the right lower quadrant (series 2, image 49) and mild distension of the upstream ilium measuring up to 2.9 cm in diameter. Remaining small bowel is within normal limits. Scattered colonic diverticulosis. No pericolonic inflammatory changes. Small hiatal hernia. Vascular/Lymphatic: Atherosclerotic calcification of the aortoiliac axis. No abdominal or pelvic lymphadenopathy identified. Reproductive: Uterus and bilateral adnexa are unremarkable. Other: No abdominal wall hernia or abnormality. No abdominopelvic ascites. No abscess. Musculoskeletal: Grade 1 anterolisthesis L4 on L5. No acute  osseous findings. IMPRESSION: 1. Long segment wall thickening and surrounding mild inflammatory changes of the terminal ileum suggesting terminal ileitis, which can be seen in the setting of inflammatory bowel disease. 2. There is focal caliber change proximally with mild distension of the more proximal ileum raising the possibility of stricture. No evidence to suggest small bowel obstruction. Electronically Signed   By: Davina Poke M.D.   On: 05/30/2019 11:25   Ct Head Wo Contrast  Result Date: 05/30/2019 CLINICAL DATA:  Altered level of consciousness EXAM: CT HEAD WITHOUT CONTRAST CT CERVICAL SPINE WITHOUT CONTRAST TECHNIQUE: Multidetector CT imaging of the head and cervical spine was performed following the standard protocol without intravenous contrast. Multiplanar CT image reconstructions of the cervical spine were also generated. COMPARISON:  05/24/2019 FINDINGS: CT HEAD FINDINGS Brain: No evidence of acute infarction, hemorrhage, extra-axial collection, ventriculomegaly, or mass effect. Generalized cerebral atrophy. Periventricular white matter low attenuation likely secondary to microangiopathy. Vascular: Cerebrovascular atherosclerotic calcifications are noted. Skull: Negative for fracture or focal lesion. Sinuses/Orbits: Visualized portions of the orbits are unremarkable. Visualized portions of the paranasal sinuses and mastoid air cells are unremarkable. Other: Scalp contusion along the left vertex. CT CERVICAL SPINE FINDINGS Alignment: Normal. Skull base and vertebrae: No acute fracture. No primary bone lesion or focal pathologic process. Erosion along the anterior aspect of the dens as can be seen with crystalline arthropathy. Soft tissues and spinal canal: No prevertebral fluid or swelling. No visible canal hematoma. Disc levels: Degenerative disc disease with disc height loss at C4-5 and C5-6. Right uncovertebral degenerative changes at C4-5 and C5-6 resulting in foraminal stenosis. Mild left  foraminal stenosis at C4-5. Upper chest: Left apical scarring. Other: Bilateral carotid artery atherosclerosis. IMPRESSION: 1. No acute intracranial pathology. 2.  No acute osseous injury of the cervical spine. Electronically Signed   By: Kathreen Devoid   On: 05/30/2019 07:33   Ct Head Wo Contrast  Result Date: 05/24/2019 CLINICAL DATA:  Worsening headaches. Recent falls. Nausea and emesis today. EXAM: CT HEAD WITHOUT CONTRAST TECHNIQUE: Contiguous axial images were obtained from the base of the skull through the vertex without intravenous contrast. COMPARISON:  Head CT 05/16/2019 FINDINGS: Brain: No intracranial hemorrhage, mass effect, or midline shift. Unchanged degree of atrophy and chronic small vessel ischemia. No hydrocephalus. The basilar cisterns are patent. No evidence of territorial infarct or acute ischemia. No extra-axial or intracranial fluid collection. Vascular: Atherosclerosis of skullbase vasculature without hyperdense vessel or abnormal calcification. Skull: No fracture or focal lesion. Sinuses/Orbits: Minor mucosal thickening of ethmoid air cells. No acute findings. No sinus fluid level. Bilateral lens extraction. Other: None. IMPRESSION: 1. No acute intracranial abnormality. 2. Unchanged atrophy and chronic small vessel ischemia. Electronically Signed   By: Keith Rake M.D.   On: 05/24/2019 16:48   Ct Cervical Spine Wo Contrast  Result Date: 05/30/2019 CLINICAL DATA:  Altered level  of consciousness EXAM: CT HEAD WITHOUT CONTRAST CT CERVICAL SPINE WITHOUT CONTRAST TECHNIQUE: Multidetector CT imaging of the head and cervical spine was performed following the standard protocol without intravenous contrast. Multiplanar CT image reconstructions of the cervical spine were also generated. COMPARISON:  05/24/2019 FINDINGS: CT HEAD FINDINGS Brain: No evidence of acute infarction, hemorrhage, extra-axial collection, ventriculomegaly, or mass effect. Generalized cerebral atrophy. Periventricular  white matter low attenuation likely secondary to microangiopathy. Vascular: Cerebrovascular atherosclerotic calcifications are noted. Skull: Negative for fracture or focal lesion. Sinuses/Orbits: Visualized portions of the orbits are unremarkable. Visualized portions of the paranasal sinuses and mastoid air cells are unremarkable. Other: Scalp contusion along the left vertex. CT CERVICAL SPINE FINDINGS Alignment: Normal. Skull base and vertebrae: No acute fracture. No primary bone lesion or focal pathologic process. Erosion along the anterior aspect of the dens as can be seen with crystalline arthropathy. Soft tissues and spinal canal: No prevertebral fluid or swelling. No visible canal hematoma. Disc levels: Degenerative disc disease with disc height loss at C4-5 and C5-6. Right uncovertebral degenerative changes at C4-5 and C5-6 resulting in foraminal stenosis. Mild left foraminal stenosis at C4-5. Upper chest: Left apical scarring. Other: Bilateral carotid artery atherosclerosis. IMPRESSION: 1. No acute intracranial pathology. 2.  No acute osseous injury of the cervical spine. Electronically Signed   By: Kathreen Devoid   On: 05/30/2019 07:33   Dg Chest Portable 1 View  Result Date: 05/30/2019 CLINICAL DATA:  Altered mental status EXAM: PORTABLE CHEST 1 VIEW COMPARISON:  05/16/2019 FINDINGS: The heart size and mediastinal contours are within normal limits. Both lungs are clear. Old left posterior rib fractures. Surgical clips in the left axilla. IMPRESSION: No active disease. Electronically Signed   By: Kathreen Devoid   On: 05/30/2019 06:57    Assessment/Plan  #1 weight gain this appears to be appetite related which is encouraging I do not really see any significant increased edema from baseline- lungs are clear she does not complain of any shortness of breath appears to be getting stronger-and she is eating much better according to staff at this point will monitor.  2.  History of chronic kidney disease  with some mild hypokalemia- again she has been hesitant to take any potassium because of her kidney issues which is understandable- we did discuss this with her last week- will update a metabolic panel to insure stability here her creatinine has risen slightly since her hospitalization and this will warrant updating as well.  3.  Anemia of chronic disease apparently hemoglobin at 8.7 is relatively baseline for her- she did receive a transfusion in hospital because of a GI bleeding hemoglobin down to below 7- we will have this updated as well she continues on iron.  4.  History of hypocalcemia her Caltrate has been increased to twice daily we will update this as well.  #5 history of terminal ileitis-she has completed antibiotic she appears to be making any improvement here her appetite is better still complains of some abdominal discomfort after eating she will have GI follow-up- I did note she is on a proton pump inhibitor as well as prednisone  Clinically she appears to be doing better-- eating better feeling stronger at this point will monitor  [phone]

## 2019-06-22 LAB — CBC AND DIFFERENTIAL
HCT: 25 — AB (ref 36–46)
Hemoglobin: 8.4 — AB (ref 12.0–16.0)
Neutrophils Absolute: 7
Platelets: 209 (ref 150–399)
WBC: 8.3

## 2019-06-22 LAB — BASIC METABOLIC PANEL
BUN: 85 — AB (ref 4–21)
Creatinine: 2.6 — AB (ref 0.5–1.1)
Glucose: 119
Potassium: 4 (ref 3.4–5.3)
Sodium: 142 (ref 137–147)

## 2019-06-28 ENCOUNTER — Non-Acute Institutional Stay (SKILLED_NURSING_FACILITY): Payer: Medicare Other | Admitting: Internal Medicine

## 2019-06-28 ENCOUNTER — Encounter: Payer: Self-pay | Admitting: Internal Medicine

## 2019-06-28 DIAGNOSIS — E876 Hypokalemia: Secondary | ICD-10-CM | POA: Diagnosis not present

## 2019-06-28 DIAGNOSIS — D62 Acute posthemorrhagic anemia: Secondary | ICD-10-CM | POA: Diagnosis not present

## 2019-06-28 DIAGNOSIS — N179 Acute kidney failure, unspecified: Secondary | ICD-10-CM

## 2019-06-28 DIAGNOSIS — K50019 Crohn's disease of small intestine with unspecified complications: Secondary | ICD-10-CM | POA: Diagnosis not present

## 2019-06-28 DIAGNOSIS — I5032 Chronic diastolic (congestive) heart failure: Secondary | ICD-10-CM | POA: Diagnosis not present

## 2019-06-28 DIAGNOSIS — I10 Essential (primary) hypertension: Secondary | ICD-10-CM | POA: Diagnosis not present

## 2019-06-28 DIAGNOSIS — N184 Chronic kidney disease, stage 4 (severe): Secondary | ICD-10-CM

## 2019-06-28 NOTE — Progress Notes (Signed)
This is a discharge note.  Level of care is skilled.  [facility]  Chief complaint- discharge note.  History of present illness.  Patient is a pleasant 83 year old female who is here for rehab after a lengthy hospitalization.  Her prior diagnosis include history of GERD as well as history of irritable bowel syndrome gross there is focal enteritis-type 2 diabetes with peripheral neuropathy COPD diastolic CHF history of breast cancer status post left mastectomy  She was hospitalized after complaining of abdominal pain with nausea vomiting and diarrhea and blood in her stool and poor p.o. intake.  She was found to be septic with hypokalemia and acute kidney injury CT of the abdomen did show a terminal ileitis and she was admitted to the hospital and treated aggressively with antibiotics.  Colonoscopy had Negative biopsies but stricture was seen in the terminal ileum there was concern for Crohn's disease with elevated set rate she was started on IV steroids and she is on a steroid taper   She will have follow-up with GI.  With Dr. Henrene Pastor on October 1.  She also had acute blood loss anemia requiring transfusion-hemoglobin was down to 6.8 but did improve with transfusion up to the mid eights --it was 8.4 on lab done last week. She does have a history of chronic anemia and baseline hemoglobin apparently is in the mid eights.  She also has a history of acute on chronic kidney disease with suspected dehydration on hospital presentation because of GI bleed.  She did get IV fluids and creatinine improved creatinine here has been more in the mid twos apparently on hospital discharge was more in the lower twos.  At one point her potassium was slightly low at 3.3 but on lab done last week it was up to 4.0.  I saw her recently for a 5 pound weight gain but she is eating much better and is thought this is pretty much appetite related physical exam today is quite baseline without  evidence of increased edema.--I note she is on Marinol  Of note she did have significant nausea on her admission but this has improved she has been prescribed Zofran which apparently is helping significantly.  Today she has no acute complaints she will be going home with home health as well as PT OT and speech therapy and nursing support.  She will be home with her husband apparently he does have some mild dementia.  Past Medical History:  Diagnosis Date  . Allergic rhinitis   . Anemia   . Breast CA ([place])    surgery, chemo, XRT; had peripheral neuropathy (imbalance at times) after chemo  . Cellulitis    Left arm, recurrent   . Cellulitis LEFT arm recurrent 12/08/2011  . Chronic renal disease, stage IV (HCC)    Dr. Moshe Cipro  . Depression   . Fatigue   . GERD (gastroesophageal reflux disease)    gastritis, EGD 02/2007  . Gout   . Hyperlipidemia   . Hypertension   . Macular degeneration 2011  . Normal cardiac stress test 12/2014  . Osteoarthritis   . Renal insufficiency         Past Surgical History:  Procedure Laterality Date  . BIOPSY  06/06/2019   Procedure: BIOPSY;  Surgeon: Ladene Artist, MD;  Location: [facility];  Service: Endoscopy;;  . cataracts bilaterally  1 -2012  . COLONOSCOPY WITH PROPOFOL N/A 06/06/2019   Procedure: COLONOSCOPY WITH PROPOFOL;  Surgeon: Ladene Artist, MD;  Location:  [facility];  Service: Endoscopy;  Laterality: N/A;  . LUNG BIOPSY  1999   neg  . MASTECTOMY Left 1999   and lymphnodes         Allergies  Allergen Reactions  . Codeine Nausea And Vomiting  . Morphine Nausea And Vomiting  . Oxycodone-Acetaminophen Nausea And Vomiting  . Sulfonamide Derivatives Nausea And Vomiting  . Tramadol Hcl Nausea And Vomiting        MEDICATIONS   Medication Sig  . acetaminophen (TYLENOL) 325 MG tablet Take 2 tablets (650 mg total) by mouth every 6 (six) hours as needed for mild pain, fever or headache (or Fever  >/= 101).  Marland Kitchen allopurinol (ZYLOPRIM) 100 MG tablet Take 1 tablet (100 mg total) by mouth daily.  Marland Kitchen amLODipine (NORVASC) 10 MG tablet Take 1 tablet (10 mg total) by mouth daily.  Marland Kitchen atorvastatin (LIPITOR) 20 MG tablet Take 1 tablet (20 mg total) by mouth daily.  . Calcium Carb-Cholecalciferol (CALTRATE 600+D3 PO) Take 1 tablet by mouth 2 (two) times daily.   . Cholecalciferol (VITAMIN D3) 1000 UNITS CAPS Take 1 capsule by mouth daily.   Marland Kitchen dicyclomine (BENTYL) 10 MG capsule Take 2 capsules (20 mg total) by mouth 4 (four) times daily -  before meals and at bedtime.  . dronabinol (MARINOL) 2.5 MG capsule Take 1 capsule (2.5 mg total) by mouth 2 (two) times daily before a meal.  . escitalopram (LEXAPRO) 10 MG tablet Take 2 tablets (20 mg total) by mouth daily.  . ferrous sulfate 325 (65 FE) MG tablet Take 1 tablet (325 mg total) by mouth daily with breakfast.  . loperamide (IMODIUM A-D) 2 MG tablet Take 2 mg by mouth 4 (four) times daily as needed for diarrhea or loose stools.  . metoprolol tartrate (LOPRESSOR) 25 MG tablet Take 1 tablet (25 mg total) by mouth 2 (two) times daily.  . mirtazapine (REMERON) 15 MG tablet Take 15 mg by mouth at bedtime.  . Multiple Vitamins-Minerals (CENTRUM SILVER PO) Take 1 tablet by mouth daily.   . Nutritional Supplements (FEEDING SUPPLEMENT, BOOST BREEZE,) LIQD Take 237 mLs by mouth daily.  . ondansetron (ZOFRAN) 4 MG tablet Take 4 mg by mouth 4 (four) times daily.  . pantoprazole (PROTONIX) 40 MG tablet Take 1 tablet (40 mg total) by mouth daily before breakfast.  . predniSONE (DELTASONE) 10 MG tablet Take 4 tablets (40 mg total) by mouth daily with breakfast for 14 days, THEN 3 tablets (30 mg total) daily with breakfast for 14 days, THEN 2 tablets (20 mg total) daily with breakfast for 14 days, THEN 1.5 tablets (15 mg total) daily with breakfast for 14 days, THEN 1 tablet (10 mg total) daily with breakfast for 14 days.  . vitamin B-12 (CYANOCOBALAMIN) 1000 MCG tablet  Take 1,000 mcg by mouth daily.    . vitamin C (ASCORBIC ACID) 500 MG tablet Take 500 mg by mouth daily.    Review of systems.  General she is not complaining of any fever chills has gained weight.  Skin does not complain of rashes or itching.  Head ears eyes nose mouth and throat is not complaining of visual changes or sore throat.  Respiratory does not complain of shortness of breath or cough.  Cardiac does not complain of chest pain has minimal lower extremity edema.  GI is not complaining of abdominal pain nausea or vomiting apparently this has improved with the Zofran her appetite is much better.  GU is not complaining of dysuria.  Musculoskeletal has some weakness but has gained significant strength during her stay here she is ambulatory but still a bit weak.  She does complain at times of some upper back discomfort she says she has had this since she has been in the hospital.--But options for pain control have been limited because of her numerous allergies she does not do well with stronger pain medication it appears.  Neurologic she is not complaining of dizziness headache numbness has some weakness  Psych continues to be in good spirits does not complain of being overtly depressed or anxious appears to be feeling better which I suspect helps her mood.  Labs.  May 22, 2019.  WBC 8.3 hemoglobin 8.4 platelets 209.  Sodium 142 potassium 4 BUN 84.9 creatinine 2.58.  Calcium 8.1.  June 14, 2019.  WBC 5.1 hemoglobin 8.7 platelets 212.  Sodium 140 potassium 3.3 BUN 50.1 creatinine 2.54.  Calcium was 7.4  Recent Labs (within last 365 days)         Recent Labs    05/31/19 0717  06/07/19 0501 06/08/19 0513 06/09/19 0831  NA 139   < > 140 139 138  K 2.9*   < > 2.9* 4.9 3.4*  CL 97*   < > 107 108 106  CO2 32   < > 22 21* 22  GLUCOSE 90   < > 88 120* 126*  BUN 27*   < > 16 19 34*  CREATININE 2.14*   < > 2.21* 2.24* 2.20*  CALCIUM 7.0*   < > 7.9*  8.2* 8.1*  MG 1.9  --  1.3* 2.0  --    < > = values in this interval not displayed.     Recent Labs (within last 365 days)       Recent Labs    06/07/19 0501 06/08/19 0513 06/09/19 0831  AST 13* 13* 15  ALT 8 9 12   ALKPHOS 52 61 63  BILITOT 0.4 0.3 0.4  PROT 5.3* 5.8* 6.4*  ALBUMIN 2.1* 2.3* 2.5*     Recent Labs (within last 365 days)  Recent Labs    05/16/19 1500  05/30/19 0616  05/31/19 0717  06/07/19 0501 06/08/19 0513 06/09/19 0831  WBC 7.0   < > 10.3  --  6.1   < > 7.8 6.0 9.6  NEUTROABS 5.6  --  9.0*  --  4.0  --   --   --   --   HGB 8.1*   < > 8.8*   < > 6.8*   < > 8.8* 9.4* 10.5*  HCT 24.8*   < > 26.2*   < > 21.4*   < > 28.2* 29.8* 32.4*  MCV 91.9   < > 92.9  --  97.3   < > 97.6 97.4 95.3  PLT 374   < > 483*  --  366   < > 280 268 290   < > = values in this interval not displayed.      Assessment and plan.  History of sepsis with terminal ileitis- she was religiously treated with antibiotics- colonoscopy did show stricture in the terminal ileum biopsy results were negative for inflammation.  GI did follow-up and recommended Solu-Medrol and a steroid taper which she remains on.  She has been here for OT and PT and has done quite well.  She continues on the steroid taper and will have follow-up with GI-Dr. Gwendolyn Grant October 1.  2.  History of acute blood loss anemia and this  did require transfusion her hemoglobin appears relatively baseline at 8.4 this will need follow-up by GI and primary care provider she is on iron.  3.  History of acute on chronic kidney disease stage IV this was treated with IV fluids in the hospital she appears to be relatively close to her baseline with a creatinine of 2.58 BUN is somewhat elevated in the 80s this will warrant follow-up as well-- she has times has some slightly low potassium but it is normal at 4.0 on most recent lab She is drinking better which I suspect will help with her labs--she will have follow-up with primary  care provider  Her calcium also was 7.4 couple weeks ago we did increase her Caltrate to twice a day and it was 8.1 on lab done last week.  4.-  History of type 2 diabetes this appears to be diet-controlled her hemoglobin A1c was 6.2.--EZMOQHU is on metabolic panels l have been in the 90s to low 100s   5 -Question depression frontotemporal dementia.  Apparently in the hospital she did admit feeling depressed for several weeks with dizzy spells and headaches she is on Lexapro.  She is also on Remeron 15 mg nightly.  This will warrant follow-up by primary care physician she continues to be bright alert pleasant and appropriate.  6.  Gout this was not active during her stay here she is on allopurinol 100 mg a day  7.  Hypertension this appears to be stable on Lopressor 25 mg twice daily as well as Norvasc 10 mg a day recent blood pressures 110/72--111/52-.  #8 hyperlipidemia she does continue on Lipitor since her stay here was quite short will defer to primary care provider.  9.  History of nausea again this has improved significantly with the Zofran with meals and at bedtime-she also has orders for Bentyl ~20 mg 4 times a day.  10.  History of diastolic CHF this appears to be stable she does not complain of any increased shortness of breath or chest pain she is getting stronger she does not really have increased edema-I suspect her weight gain is due to eating better.  11.  History of weight loss again she is reversed this she is on Marinol and appears to be eating significantly better complains of less abdominal pain after eating-  Again she will be going home to be with her husband she will need home health with advanced home health in addition to PT OT and ST as well as nursing support for multiple medical issues.  Primary care provider follow-up has been scheduled she also will see GI on October 1.  [id number] note greater than 35 minutes spent on this discharge summary- greater  than 50% of time spent coordinating a plan of care for numerous diagnoses

## 2019-06-29 MED ORDER — ESCITALOPRAM OXALATE 10 MG PO TABS: 20.0000 mg | ORAL_TABLET | Freq: Every day | ORAL | 0 refills | Status: DC

## 2019-06-29 MED ORDER — ATORVASTATIN CALCIUM 20 MG PO TABS: 20.0000 mg | ORAL_TABLET | Freq: Every day | ORAL | 0 refills | Status: AC

## 2019-06-29 MED ORDER — METOPROLOL TARTRATE 25 MG PO TABS: 25.0000 mg | ORAL_TABLET | Freq: Two times a day (BID) | ORAL | 0 refills | Status: DC

## 2019-06-29 MED ORDER — AMLODIPINE BESYLATE 10 MG PO TABS: 10.0000 mg | ORAL_TABLET | Freq: Every day | ORAL | 0 refills | Status: DC

## 2019-06-29 MED ORDER — PREDNISONE 10 MG PO TABS: ORAL_TABLET | ORAL | 0 refills | Status: DC

## 2019-06-29 MED ORDER — DICYCLOMINE HCL 10 MG PO CAPS: 20.0000 mg | ORAL_CAPSULE | Freq: Three times a day (TID) | ORAL | 0 refills | Status: DC

## 2019-06-29 MED ORDER — MIRTAZAPINE 15 MG PO TABS: 15.0000 mg | ORAL_TABLET | Freq: Every day | ORAL | 0 refills | Status: DC

## 2019-06-29 MED ORDER — PANTOPRAZOLE SODIUM 40 MG PO TBEC: 40.0000 mg | DELAYED_RELEASE_TABLET | Freq: Every day | ORAL | 0 refills | Status: DC

## 2019-06-29 MED ORDER — ALLOPURINOL 100 MG PO TABS: 100.0000 mg | ORAL_TABLET | Freq: Every day | ORAL | 0 refills | Status: AC

## 2019-06-29 MED ORDER — FERROUS SULFATE 325 (65 FE) MG PO TABS: 325.0000 mg | ORAL_TABLET | Freq: Every day | ORAL | 0 refills | Status: DC

## 2019-06-29 MED ORDER — ONDANSETRON HCL 4 MG PO TABS: 4.0000 mg | ORAL_TABLET | Freq: Four times a day (QID) | ORAL | 0 refills | Status: DC

## 2019-06-29 MED ORDER — DRONABINOL 2.5 MG PO CAPS: 2.5000 mg | ORAL_CAPSULE | Freq: Two times a day (BID) | ORAL | 0 refills | Status: DC

## 2019-07-02 ENCOUNTER — Telehealth: Payer: Self-pay | Admitting: Internal Medicine

## 2019-07-02 ENCOUNTER — Other Ambulatory Visit: Payer: Self-pay | Admitting: *Deleted

## 2019-07-02 DIAGNOSIS — I10 Essential (primary) hypertension: Secondary | ICD-10-CM

## 2019-07-02 MED ORDER — PREDNISONE 10 MG PO TABS: ORAL_TABLET | ORAL | 0 refills | Status: DC

## 2019-07-02 NOTE — Telephone Encounter (Signed)
-----   Message from Harless Litten, RN sent at 07/02/2019  6:38 PM EDT ----- Regarding: Olevia Perches Hi Dr. Larose Kells,  I was not sure if you were aware that Mrs. Geisler "canceled" home health with [facility]. I called her to discuss [facility] CM follow up and was informed by her son, [name], that she declined home health services. I notified Manufacturing engineer of Mrs. particia strahm from [facility] on 06/29/19. I am hopeful that palliative care services will be able to engage. I also made a referral for [facility] Pharmac[facility].  The son reports they cannot find Mrs. Youse's dc paper work or prescriptions. It appears per facility she is still supposed to be on prednisone. Family will likely be calling the office for prednisone rx if they cannot find the prescription.   According to the hospital dc summary  predniSONE 10 MG tablet Commonly known as: DELTASONE Take 4 tablets (40 mg total) by mouth daily with breakfast for 14 days, THEN 3 tablets (30 mg total) daily with breakfast for 14 days, THEN 2 tablets (20 mg total) daily with breakfast for 14 days, THEN 1.5 tablets (15 mg total) daily with breakfast for 14 days, THEN 1 tablet (10 mg total) daily with breakfast for 14 days. Start taking on: June 09, 2019   Thank you.   Marthenia Rolling, MSN-Ed, RN,BSN [place] Acute Care Coordinator [phone] [facility]) [phone]  (Toll free office)

## 2019-07-02 NOTE — Patient Outreach (Signed)
Member assessed for potential [facility] Care Management needs as a benefit of [place] Medicare.  Made aware by [facility] RN that Mrs. Wenger discharged on her own accord from [facility] on 06/29/19 with home health.   Telephone call made to Mrs. Word at [phone]. Patient identifiers confirmed. Discussed [facility] follow up. Conversation was brief with member as she handed the phone to her son [name].  [name] (son) indicates Mrs. Ciolino cancelled [facility] services. States "she does what she wants and can be stubborn."  [name] states they cannot find the dc paperwork from the facility. Son reports that he thinks Mrs. Medal is supposed to be taking prednisone. However, she is not currently taking it.   Writer informed [name] that [place] referral and [place] referral will be made. Also discussed that writer will follow up with facility dc planner regarding dc paperwork and medications.   Marthenia Rolling, MSN-Ed, RN,BSN [place] Acute Care Coordinator [phone] [facility]) [phone]  (Toll free office)

## 2019-07-02 NOTE — Telephone Encounter (Signed)
Reviewed note from Mrs. [place] Crane, Therapist, sports. Advise patient: I am aware she is at home. Strongly recommend to accept home health services She is supposed to be on prednisone, I resend a prescription for her as recommended by her doctors at the nursing home on 06/29/2019.  Recommend to keep appointment with me on 07/06/2019 and with GI 07/12/2019

## 2019-07-02 NOTE — Patient Outreach (Addendum)
Telephone call made to [facility] planner who reports member was sent home with all of her necessary dc paperwork. Writer requested dc med list which appears to have prednisone on it. Dc planner also confirms that [place] made her aware that home health was declined by Mrs. Daughtridge.  Telephone call made to [name] (son)  at [phone].  Patient identifiers confirmed. Made [name] aware that dc paperwork was sent home with Mrs. Burleigh. [name] states they could not find it but he will look again.   [name] does not live with Mrs. Demauro. Mrs. Worden lives with husband. [name] states he will go back to the home to look for it as prescriptions are likely with the paperwork. Provided [name] writer's contact information.  Son endorses that they need help sorting out member's medications.   In the meantime, will make [facility] and [facility] referrals. Also sent notification to [facility] to make aware of member's dc to home. Will also send message to PCP to make aware that home health was refused.   Marthenia Rolling, MSN-Ed, RN,BSN [name] Acute Care Coordinator [phone] [facility]) [phone]  (Toll free office)

## 2019-07-03 ENCOUNTER — Other Ambulatory Visit: Payer: Self-pay | Admitting: Pharmacist

## 2019-07-03 NOTE — Telephone Encounter (Signed)
Left voicemail message on patient's cell phone, requesting a call back.

## 2019-07-03 NOTE — Patient Outreach (Signed)
[facility]) Care Management  07/03/2019  Sam[facility] 1936-04-12 [id number]   Called patient regarding medication review post discharge and medication assistance. Unfortunately, patient would not confirm HIPAA and said she was not interested in reviewing her medications with me or having a medication assistance evaluation.   Patient is an 83 year old female with multiple medical conditions including but not limited to:  Depression, CHF, COPD, type 2 diabetes with secondary peripheral neuropathy, hypertension, GERD, hyperlipidemia, generalized weakness, history of breast cancer, osteoarthritis, terminal ileitis, and vitamin B12 deficiency.  Patient was recently hospitalized for terminal ileitis and was discharged to a SNF. She was discharged home from SNF in last few days.  Called patient's son.  HIPAA identifiers were obtained.  [name] said he was not at his mother's home at the time of my call. An attempt was made to schedule an appointment to do a telephone medication review when he could physically be at his mother's home with her medications.  [name] said he would call me back with an appointment time after he looked over his schedule.   Marthenia Rolling, RN sent over the medication list from the SNF:  Plan: Await a call back from [facility]. Call [name] back in 5-7 business days if I do not hear back from him due to upcoming PAL.  Elayne Guerin, PharmD, [place] Clinical Pharmacist [phone]

## 2019-07-04 ENCOUNTER — Other Ambulatory Visit: Payer: Self-pay

## 2019-07-04 NOTE — Patient Outreach (Signed)
[facility]) Care Management  07/04/2019  Samantha Mckenzie May 22, 1936 [id number]   Referral Date: 07/03/2019 Referral Source:  Post Acute Liaison Date of Admission:  06/09/2019 Diagnosis: Terminal Ileitis/Crohn's per son-[name] Date of Discharge: 06/29/2019 Facility:  [facility]:  Medicare  Outreach attempt: spoke with son [name]. He states he has been trying to help manage his Mom's care since she left the facility against advice.  He states that he feels that his mom is some weaker but seems to get things done such as her care and managing her eating.  He states that his mom has Crohn's but really have not had any problems since her hospitalization back in August. He states she has had 7 ED/admits recently.  Discussed goal of trying to keep patient out of the hospital.  He states that he knows that his mom's memory is not good and that was told one time that she was not able to make decisions.    Patient has refused home health which he states he wishes that she would not have as he is struggling to piece together things.  Advised him that palliative care is aware that she is home and will be in contact.  Son wants to be the primary contact as patient refuses help and does not remember things.  Patient has an appointment with the PCP on Friday and that his dad will take the patient as he has to work.  Patient lives in the home with spouse.  [facility] pharmacist attempting to work with family on medication management.  Son [name] states that he spoke with the pharmacist on yesterday and also left her follow up voice mail.  Son really not sure about medication management at this time and has not had time to go to parents home to locate discharge papers/prescriptions but will try.    Discussed [facility] services and nurse calls weekly.  He consent to assistance from [facility] at this time.    Plan: RN CM will outreach son next week to continue education and support.    Jone Baseman, RN, MSN [facility] Care Management Care Management Coordinator Direct Line [phone] Toll Free: [phone]  Fax: [phone]

## 2019-07-05 ENCOUNTER — Telehealth: Payer: Self-pay | Admitting: Licensed Clinical Social Worker

## 2019-07-05 ENCOUNTER — Other Ambulatory Visit: Payer: Self-pay

## 2019-07-05 NOTE — Telephone Encounter (Signed)
Palliative Care SW left a vm with patient's son, [name], to schedule a visit.

## 2019-07-06 ENCOUNTER — Other Ambulatory Visit: Payer: Self-pay | Admitting: Pharmacist

## 2019-07-06 ENCOUNTER — Ambulatory Visit (INDEPENDENT_AMBULATORY_CARE_PROVIDER_SITE_OTHER): Payer: Medicare Other | Admitting: Internal Medicine

## 2019-07-06 DIAGNOSIS — R531 Weakness: Secondary | ICD-10-CM

## 2019-07-06 DIAGNOSIS — R627 Adult failure to thrive: Secondary | ICD-10-CM

## 2019-07-06 DIAGNOSIS — F32A Depression, unspecified: Secondary | ICD-10-CM

## 2019-07-06 DIAGNOSIS — K50018 Crohn's disease of small intestine with other complication: Secondary | ICD-10-CM

## 2019-07-06 DIAGNOSIS — F329 Major depressive disorder, single episode, unspecified: Secondary | ICD-10-CM | POA: Diagnosis not present

## 2019-07-06 MED ORDER — ESCITALOPRAM OXALATE 10 MG PO TABS: 20.0000 mg | ORAL_TABLET | Freq: Every day | ORAL | 0 refills | Status: AC

## 2019-07-06 NOTE — Patient Outreach (Addendum)
[facility]) Care Management  07/06/2019  Samantha Mckenzie 06/04/1936 [id number]   Patient was called for medication review post discharge per referral. Unfortunately, she did not answer the home phone or mobile number listed on her chart. HIPAA compliant message was left on the mobile voicemail. Called patient's son Samantha Mckenzie to let him know I was trying to reach his mother and asked him to give me a call whenever he is at their home before 5pm and can do a medication review.  Plan: Send unsuccessful outreach letter. Call patient back in 10-14 business days due to up coming PAL.  Elayne Guerin, PharmD, [place] Clinical Pharmacist [phone]  ADDENDUM  Samantha Mckenzie called me back and asked me to call his mother back. Patient was called back. HIPAA identifiers were obtained.    [facility]) Care Management  [place]   07/06/2019  Samantha Mckenzie Oct 26, 1935 [id number]  Reason for referral: Medication Adherence, Medication Reconciliation Post Discharge  Referral source: [facility] Inpatient Liaison Current insurance: Medicare A & B, Tricare  Outreach:  Successful telephone call with patient and son.  HIPAA identifiers verified.   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  Patient was recently hospitalized for terminal ileitis and was discharged to a SNF. She was discharged home from SNF in last few days.   Does the patient ever forget to take medication?  yes Does the patient have problems obtaining medications due to transportation?   no Does the patient have problems obtaining medications due to cost?  no  Does the patient feel that medications  prescribed are effective?  yes Does the patient ever experience any side effects to the medications prescribed?  no  Does the patient measure his/her own blood glucose at home?  No Does the patient measure his/her own blood pressure at home? No   Objective: The ASCVD Risk score Mikey Bussing DC Jr., et al., 2013) failed to calculate for the following reasons:   The 2013 ASCVD risk score is only valid for ages 75 to 5   The patient has a prior MI or stroke diagnosis  Lab Results  Component Value Date   CREATININE 2.6 (A) 06/22/2019   CREATININE 2.5 (A) 06/14/2019   CREATININE 2.20 (H) 06/09/2019    Lab Results  Component Value Date   HGBA1C 5.2 05/30/2019    Lipid Panel     Component Value Date/Time   CHOL 125 06/27/2018 1222   TRIG 140.0 06/27/2018 1222   HDL 42.60 06/27/2018 1222   CHOLHDL 3 06/27/2018 1222   VLDL 28.0 06/27/2018 1222   LDLCALC 55 06/27/2018 1222   LDLDIRECT 71.0 11/19/2015 0944    BP Readings from Last 3 Encounters:  06/28/19 110/72  06/21/19 135/70  06/15/19 132/72    Allergies  Allergen Reactions  . Codeine Nausea And Vomiting  . Morphine Nausea And Vomiting  . Oxycodone-Acetaminophen Nausea And Vomiting  . Sulfonamide Derivatives Nausea And Vomiting  . Tramadol Hcl Nausea And Vomiting    Medications Reviewed Today    Reviewed by Elmore Guise, CMA (Certified Medical Assistant) on 06/29/19 at [place] List Status: <None>  Medication Order Taking? Sig Documenting Provider Last Dose Status Informant  acetaminophen (TYLENOL) 325 MG tablet [id number] Yes Take 2  tablets (650 mg total) by mouth every 6 (six) hours as needed for mild pain, fever or headache (or Fever >/= 101). Roxan Hockey, MD Taking Active Family Member  allopurinol (ZYLOPRIM) 100 MG tablet [id number] Yes Take 1 tablet (100 mg total) by mouth daily. Colon Branch, MD Taking Active Family Member           Med Note Mel Almond, [place] T   Mon Jun 11, 2019 11:01 AM)    amLODipine  (NORVASC) 10 MG tablet [id number] Yes Take 1 tablet (10 mg total) by mouth daily. Colon Branch, MD Taking Active Family Member           Med Note Mel Almond, [place] T   Mon Jun 11, 2019 11:01 AM)    atorvastatin (LIPITOR) 20 MG tablet [id number] Yes Take 1 tablet (20 mg total) by mouth daily. Colon Branch, MD Taking Active Family Member           Med Note Mel Almond, [place] T   Mon Jun 11, 2019 11:01 AM)    Calcium Carb-Cholecalciferol (CALTRATE 600+D3 PO) [id number] Yes Take 1 tablet by mouth 2 (two) times daily.  [provider] Taking Active Family Member  Cholecalciferol (VITAMIN D3) 1000 UNITS CAPS [id number] Yes Take 1 capsule by mouth daily.  [provider] Taking Active Family Member  dicyclomine (BENTYL) 10 MG capsule [id number] Yes Take 2 capsules (20 mg total) by mouth 4 (four) times daily -  before meals and at bedtime. Colon Branch, MD Taking Active Family Member           Med Note Mel Almond, [place] T   Mon Jun 11, 2019 11:01 AM)    dronabinol (MARINOL) 2.5 MG capsule [id number] Yes Take 1 capsule (2.5 mg total) by mouth 2 (two) times daily before a meal. Colon Branch, MD Taking Active Family Member           Med Note Mel Almond, [place] T   Mon Jun 11, 2019 11:01 AM)    escitalopram (LEXAPRO) 10 MG tablet [id number] Yes Take 2 tablets (20 mg total) by mouth daily. Colon Branch, MD Taking Active Family Member           Med Note Mel Almond, [place] T   Mon Jun 11, 2019 11:01 AM)    ferrous sulfate 325 (65 FE) MG tablet [id number] Yes Take 1 tablet (325 mg total) by mouth daily with breakfast. Elodia Florence., MD Taking Active   loperamide (IMODIUM A-D) 2 MG tablet [id number] Yes Take 2 mg by mouth 4 (four) times daily as needed for diarrhea or loose stools. [provider] Taking Active Family Member  metoprolol tartrate (LOPRESSOR) 25 MG tablet [id number] Yes Take 1 tablet (25 mg total) by mouth 2 (two) times daily. Roxan Hockey, MD Taking Active Family Member            Med Note Mel Almond, [place] T   Mon Jun 11, 2019 11:01 AM)    mirtazapine (REMERON) 15 MG tablet [id number] Yes Take 15 mg by mouth at bedtime. [provider] Taking Active Family Member           Med Note Elna Breslow Jun 11, 2019 11:01 AM)    Multiple Vitamins-Minerals (CENTRUM SILVER PO) [id number] Yes Take 1 tablet by mouth daily.  [provider] Taking Active Family Member  Nutritional Supplements (FEEDING SUPPLEMENT, BOOST BREEZE,) [place] [id number] Yes Take 237 mLs by mouth daily. [provider] Taking Active  ondansetron (ZOFRAN) 4 MG tablet [id number] Yes Take 4 mg by mouth 4 (four) times daily. [provider] Taking Active   pantoprazole (PROTONIX) 40 MG tablet [id number] Yes Take 1 tablet (40 mg total) by mouth daily before breakfast. Colon Branch, MD Taking Active Family Member           Med Note Mel Almond, [place] T   Mon Jun 11, 2019 11:01 AM)    predniSONE (DELTASONE) 10 MG tablet [id number] Yes Take 4 tablets (40 mg total) by mouth daily with breakfast for 14 days, THEN 3 tablets (30 mg total) daily with breakfast for 14 days, THEN 2 tablets (20 mg total) daily with breakfast for 14 days, THEN 1.5 tablets (15 mg total) daily with breakfast for 14 days, THEN 1 tablet (10 mg total) daily with breakfast for 14 days. Elodia Florence., MD Taking Active   vitamin B-12 (CYANOCOBALAMIN) 1000 MCG tablet [id number] Yes Take 1,000 mcg by mouth daily.   [provider] Taking Active Family Member  vitamin C (ASCORBIC ACID) 500 MG tablet [id number] Yes Take 500 mg by mouth daily. [provider] Taking Active Family Member          Assessment: Drugs sorted by system:  Neurologic/Psychologic: Escitalopram, Mirtazapine  Cardiovascular:  Amlodipine, Atorvastatin, Metoprolol Tartrate   Gastrointestinal: Dicyclomine, Loperamide, Ondansetron, Pantoprazole, cyanocobalamin, Vitamin C  Boost (unknown), Dronabinol (on the med list  from the SNF but patient did  Renal: Allopurinol  Pain: Acetaminophen  Vitamins/Minerals/Supplements: Calcium Carbonate + D, Cholecalciferol, Ferrous sulfate, Multiple Vitamins  Misc: Prednisone  Medication Review Findings:  Dronabinol was listed on the medication list and on the list from SNF but patient did not recognize the name and was not sure she had in her possession.  Adherence- Patient said she was interested in having her medication "pill packed".  However, after explaining that she would need to change pharmacies, patient said she would prefer to speak with her son first.  Called patient's son back. He said he would speak with his mother and get back to me about pill packing. He was concerned about billing because his mother has [employer]. Often, [employer] charges a higher copay for maintenance medications that are not filled via mail order.  Patient's son was given the phone number for [facility] to reach out to about pill packing and billing.   Medication Adherence Findings: Adherence Review  []  Excellent (no doses missed/week)     []  Good (no more than 1 dose missed/week)     [x]  Partial (2-3 doses missed/week) []  Poor (>3 doses missed/week)  Patient with good understanding of regimen and good understanding of indications.    Medication Assistance Findings:  No medication assistance needs identified  Patient has Tricare and did not express issues obtaining her medications due to cost.   Extra Help:  Not eligible for Extra Help Low Income Subsidy based on reported income and assets   Additional medication assistance options reviewed with patient as warranted:  No other options identified  Plan: Will follow-up in 7-10 business days.  Route note to PCP  Elayne Guerin, PharmD, [place] Clinical Pharmacist [phone]

## 2019-07-06 NOTE — Progress Notes (Signed)
Subjective:    Patient ID: Samantha Mckenzie, female    DOB: 21-Aug-1936, 83 y.o.   MRN: [id number]  DOS:  07/06/2019 Type of visit - description: Attempted  to make this a video visit, due to technical difficulties from the patient side it was not possible  thus we proceeded with a Virtual Visit via Telephone    I connected with@   by telephone and verified that I am speaking with the correct person using two identifiers.  THIS ENCOUNTER IS A VIRTUAL VISIT DUE TO COVID-19 - PATIENT WAS NOT SEEN IN THE OFFICE. PATIENT HAS CONSENTED TO VIRTUAL VISIT / TELEMEDICINE VISIT   Location of patient: home  Location of provider: office  I discussed the limitations, risks, security and privacy concerns of performing an evaluation and management service by telephone and the availability of in person appointments. I also discussed with the patient that there may be a patient responsible charge related to this service. The patient expressed understanding and agreed to proceed.   History of Present Illness: Follow-up Admitted to the hospital 05/30/2019, discharge 06/09/2019. Had GI problems, had a colonoscopy, discharged on steroids. Was evaluated by psychiatry.  She went to nursing home, discharged a week ago, currently at home.  Medication compliance? Request a refill on Lexapro, has not started prednisone yet, questioning if she needs it and is afraid of side effects.  Review of Systems Still has diarrhea, mostly postprandial No nausea vomiting.  No blood in the stools. No fever chills Appetite is somewhat decreased.  Past Medical History:  Diagnosis Date  . Allergic rhinitis   . Anemia   . Breast CA ([place])    surgery, chemo, XRT; had peripheral neuropathy (imbalance at times) after chemo  . Cellulitis    Left arm, recurrent   . Cellulitis LEFT arm recurrent 12/08/2011  . Chronic renal disease, stage IV (HCC)    Dr. Moshe Cipro  . Depression   . Fatigue   . GERD (gastroesophageal  reflux disease)    gastritis, EGD 02/2007  . Gout   . Hyperlipidemia   . Hypertension   . Macular degeneration 2011  . Normal cardiac stress test 12/2014  . Osteoarthritis   . Renal insufficiency     Past Surgical History:  Procedure Laterality Date  . BIOPSY  06/06/2019   Procedure: BIOPSY;  Surgeon: Ladene Artist, MD;  Location: [facility];  Service: Endoscopy;;  . cataracts bilaterally  1 -2012  . COLONOSCOPY WITH PROPOFOL N/A 06/06/2019   Procedure: COLONOSCOPY WITH PROPOFOL;  Surgeon: Ladene Artist, MD;  Location: [facility] ENDOSCOPY;  Service: Endoscopy;  Laterality: N/A;  . LUNG BIOPSY  1999   neg  . MASTECTOMY Left 1999   and lymphnodes     Social History   Socioeconomic History  . Marital status: Married    Spouse name: Not on file  . Number of children: 2  . Years of education: Not on file  . Highest education level: Not on file  Occupational History  . Occupation: retired     Fish farm manager: RETIRED  Social Needs  . Financial resource strain: Not on file  . Food insecurity    Worry: Not on file    Inability: Not on file  . Transportation needs    Medical: No    Non-medical: No  Tobacco Use  . Smoking status: Never Smoker  . Smokeless tobacco: Never Used  Substance and Sexual Activity  . Alcohol use: Not Currently    Alcohol/week: 0.0  standard drinks    Comment: rarely  . Drug use: No  . Sexual activity: Not Currently  Lifestyle  . Physical activity    Days per week: Not on file    Minutes per session: Not on file  . Stress: Not on file  Relationships  . Social Herbalist on phone: Not on file    Gets together: Not on file    Attends religious service: Not on file    Active member of club or organization: Not on file    Attends meetings of clubs or organizations: Not on file    Relationship status: Not on file  . Intimate partner violence    Fear of current or ex partner: Not on file    Emotionally abused: Not on file    Physically  abused: Not on file    Forced sexual activity: Not on file  Other Topics Concern  . Not on file  Social History Narrative   Lives w/ husband , 2 children in [place], 2 [place] as of 07/06/2019      Reactions   Codeine Nausea And Vomiting   Morphine Nausea And Vomiting   Oxycodone-acetaminophen Nausea And Vomiting   Sulfonamide Derivatives Nausea And Vomiting   Tramadol Hcl Nausea And Vomiting      Medication List       Accurate as of July 06, 2019 11:59 PM. If you have any questions, ask your nurse or doctor.        acetaminophen 325 MG tablet Commonly known as: TYLENOL Take 2 tablets (650 mg total) by mouth every 6 (six) hours as needed for mild pain, fever or headache (or Fever >/= 101).   allopurinol 100 MG tablet Commonly known as: ZYLOPRIM Take 1 tablet (100 mg total) by mouth daily.   amLODipine 10 MG tablet Commonly known as: NORVASC Take 1 tablet (10 mg total) by mouth daily.   atorvastatin 20 MG tablet Commonly known as: LIPITOR Take 1 tablet (20 mg total) by mouth daily.   CALTRATE 600+D3 PO Take 1 tablet by mouth 2 (two) times daily.   CENTRUM SILVER PO Take 1 tablet by mouth daily.   dicyclomine 10 MG capsule Commonly known as: BENTYL Take 2 capsules (20 mg total) by mouth 4 (four) times daily -  before meals and at bedtime.   dronabinol 2.5 MG capsule Commonly known as: Marinol Take 1 capsule (2.5 mg total) by mouth 2 (two) times daily before a meal.   escitalopram 10 MG tablet Commonly known as: Lexapro Take 2 tablets (20 mg total) by mouth daily.   feeding supplement (BOOST BREEZE) Liqd Take 237 mLs by mouth daily.   ferrous sulfate 325 (65 FE) MG tablet Take 1 tablet (325 mg total) by mouth daily with breakfast.   loperamide 2 MG tablet Commonly known as: IMODIUM A-D Take 2 mg by mouth 4 (four) times daily as needed for diarrhea or loose stools.   metoprolol tartrate 25 MG tablet Commonly known as: LOPRESSOR  Take 1 tablet (25 mg total) by mouth 2 (two) times daily.   mirtazapine 15 MG tablet Commonly known as: REMERON Take 1 tablet (15 mg total) by mouth at bedtime.   ondansetron 4 MG tablet Commonly known as: ZOFRAN Take 1 tablet (4 mg total) by mouth 4 (four) times daily.   pantoprazole 40 MG tablet Commonly known as: PROTONIX Take 1 tablet (40  mg total) by mouth daily before breakfast.   predniSONE 10 MG tablet Commonly known as: DELTASONE Take 3 tablets (30 mg total) by mouth daily with breakfast for 9 days, THEN 2 tablets (20 mg total) daily with breakfast for 14 days, THEN 1.5 tablets (15 mg total) daily with breakfast for 14 days, THEN 1 tablet (10 mg total) daily with breakfast for 14 days. Start taking on: July 02, 2019   vitamin B-12 1000 MCG tablet Commonly known as: CYANOCOBALAMIN Take 1,000 mcg by mouth daily.   vitamin C 500 MG tablet Commonly known as: ASCORBIC ACID Take 500 mg by mouth daily.   Vitamin D3 25 MCG (1000 UT) Caps Take 1 capsule by mouth daily.           Objective:   Physical Exam There were no vitals taken for this visit. This is virtual visit, via telephone, she is alert oriented x3, sounds stronger than in previous weeks.     Assessment       Assessment  DM- neuropathy (paresthesias, nl pinprick exam), CKD HTN Hyperlipidemia: Lipitor intolerant? CAD? States see Dr Terrence Dupont, no OV  records. (-) stress test 12-2014 CKD -- sees nephrology Gout GERD, nl EGD 01-2015  H/o Depression Leg pain-- RLS vs neuropathy, on gabapentin since 2013, good results H/o anemia --- no iron def , Cscope 2012, normal EGD 01-2015 Breast cancer:  --S/p Surgery (L mastectomy), chemotherapy, XRT --Peripheral neuropathy felt to be due to chemotherapy --Recurrent left arm cellulitis DJD  PLAN Extensive chart review.  This is a virtual phone visit Status post admission to the hospital, discharge 06/09/2019, sent to a nursing home, currently at her house for  1 week. Terminal ileitis, stricture: Had a colonoscopy 06/06/2019, was noted to have a stricture, biopsy was negative for cancer, question of Crohn's. Recommend to start prednisone, she already has a prescription but has been reluctant to start.  Encourage to do so. Has diarrhea, encouraged to take Imodium as needed. Plans to give her follow-up with GI 07/12/2019 Depression, capacity to make decisions: Was seen by psychiatry "patient does not demonstrate capacity to make decisions regarding current medical condition".  Hospital team recommend "an authorize representative to help her make medical decisions, discussed with the patient's son". (See discharge summary). At this point, she is still somewhat depressed, request a refill for Lexapro, sent to [facility] Failure to thrive: Currently at home, her husband is quite debilitated, they really need help, strongly recommend her to accept palliative care.  Wonders if she could drive, recommend not to due to generalized weakness. Med compliance: Unclear to me what she is taking, encouraged to review her list, take the medications as prescribed, call for refills. RTC in person in 2 to 3 weeks      I discussed the assessment and treatment plan with the patient. The patient was provided an opportunity to ask questions and all were answered. The patient agreed with the plan and demonstrated an understanding of the instructions.   The patient was advised to call back or seek an in-person evaluation if the symptoms worsen or if the condition fails to improve as anticipated.  I provided 38 minutes of non-face-to-face time during this encounter.  Kathlene November, MD

## 2019-07-07 ENCOUNTER — Encounter: Payer: Self-pay | Admitting: Internal Medicine

## 2019-07-07 NOTE — Assessment & Plan Note (Signed)
Extensive chart review.  This is a virtual phone visit Status post admission to the hospital, discharge 06/09/2019, sent to a nursing home, currently at her house for 1 week. Terminal ileitis, stricture: Had a colonoscopy 06/06/2019, was noted to have a stricture, biopsy was negative for cancer, question of Crohn's. Recommend to start prednisone, she already has a prescription but has been reluctant to start.  Encourage to do so. Has diarrhea, encouraged to take Imodium as needed. Plans to give her follow-up with GI 07/12/2019 Depression, capacity to make decisions: Was seen by psychiatry "patient does not demonstrate capacity to make decisions regarding current medical condition".  Hospital team recommend "an authorize representative to help her make medical decisions, discussed with the patient's son". (See discharge summary). At this point, she is still somewhat depressed, request a refill for Lexapro, sent to [facility] Failure to thrive: Currently at home, her husband is quite debilitated, they really need help, strongly recommend her to accept palliative care.  Wonders if she could drive, recommend not to due to generalized weakness. Med compliance: Unclear to me what she is taking, encouraged to review her list, take the medications as prescribed, call for refills. RTC in person in 2 to 3 weeks

## 2019-07-09 ENCOUNTER — Ambulatory Visit: Payer: Self-pay | Admitting: Pharmacist

## 2019-07-10 ENCOUNTER — Other Ambulatory Visit: Payer: Self-pay

## 2019-07-10 ENCOUNTER — Encounter: Payer: Self-pay | Admitting: *Deleted

## 2019-07-10 ENCOUNTER — Other Ambulatory Visit: Payer: Medicare Other | Admitting: *Deleted

## 2019-07-10 ENCOUNTER — Other Ambulatory Visit: Payer: Self-pay | Admitting: *Deleted

## 2019-07-10 ENCOUNTER — Other Ambulatory Visit: Payer: TRICARE For Life (TFL) | Admitting: Licensed Clinical Social Worker

## 2019-07-10 DIAGNOSIS — Z515 Encounter for palliative care: Secondary | ICD-10-CM

## 2019-07-10 NOTE — Progress Notes (Signed)
COMMUNITY PALLIATIVE CARE SW NOTE  PATIENT NAME: Samantha Mckenzie DOB: 1936/01/31 MRN: [id number]  PRIMARY CARE PROVIDER: Colon Branch, MD  RESPONSIBLE PARTY:  Acct ID - Guarantor Home Phone Work Phone Relationship Acct Type  [id number] [name], [name]  Self P/F     [address], [place] [address]     PLAN OF CARE and INTERVENTIONS:             1. GOALS OF CARE/ ADVANCE CARE PLANNING:  Patient's goal is to remain in her home. Patient is a DNR. 2. SOCIAL/EMOTIONAL/SPIRITUAL ASSESSMENT/ INTERVENTIONS:  SW and Palliative Care RN, Samantha Mckenzie, met with patient and her husband in their home.  Patient was alert and oriented and denied pain.  She discussed her recent hospital and facility admissions.  RN reviewed patient's medications.  Her son attempted to fill her pill box, but had some difficulty.  SW provided active listening and supportive counseling. 3. PATIENT/CAREGIVER EDUCATION/ COPING:  Patient copes by expressing her feelings openly. 4. PERSONAL EMERGENCY PLAN:  She sits down when she becomes tired. 5. COMMUNITY RESOURCES COORDINATION/ HEALTH CARE NAVIGATION:  None. 6. FINANCIAL/LEGAL CONCERNS/INTERVENTIONS:  None.     SOCIAL HX:  Social History   Tobacco Use  . Smoking status: Never Smoker  . Smokeless tobacco: Never Used  Substance Use Topics  . Alcohol use: Not Currently    Alcohol/week: 0.0 standard drinks    Comment: rarely    CODE STATUS:  DNR  ADVANCED DIRECTIVES: N MOST FORM COMPLETE:  N HOSPICE EDUCATION PROVIDED:  N PPS: Patient reports her appetite is normal.  She was observed ambulating independently. Duration of visit and documentation:  90 minutes.      Samantha Corn Byard Carranza, LCSW

## 2019-07-10 NOTE — Patient Outreach (Signed)
[place] [facility]) Care Management  07/10/2019  TRACEY HERMANCE April 25, 1936 [id number]   Transition of care  Spoke with pt's son today [name] with update and continue to obtain ongoing information concerning pt's initial assessment. Some information provided as son verified pt has had a virtual call from her primary provider over the last week with no acute issues to report. Although son prefers pt to visit the office for a  More through check up. RN encouraged son to contact the office with this request if pt is having issues that may require an office visit. No acute events or issues as pt doing better with her mobility. States he continue to assist pt with her medication administration with preparing her medication box with no needed refills mentioned.  RN verified pt continues to follow the current plan of care as RN discussed and continue to encouraged adherence. Goals review and interventions adjusted accordingly based upon pt's progress. Son also requested RN to contact the pt directly for additional confirmation however RN was unsuccessful with this outreach. Will attempt to follow up over the next week for again additional initial assessment and updated the plan of care accordingly.  [facility] CM Care Plan Problem One     Most Recent Value  Care Plan Problem One  Recent Hospital/SNF stay for Crohn's disease  Role Documenting the Problem One  Care Management Telephonic Coordinator  Care Plan for Problem One  Active  THN Long Term Goal   Patient will not readmit to the hospital within 31 days.  THN Long Term Goal Start Date  07/04/19  Interventions for Problem One Long Term Goal  Will continue to stress the importance of avoiding readmission into the hospital. Will discuss prevention measures and encourage pt and caregivers to review the printed material sent concerning her Crohn's.  THN CM Short Term Goal #1   Patient will follow up with PCP within 7 days.  THN CM Short Term Goal #1  Start Date  07/04/19  THN CM Short Term Goal #1 Met Date  07/10/19  THN CM Short Term Goal #2   Patient/caregiver will better understand Crohn's disease and diet within 21 days  THN CM Short Term Goal #2 Start Date  07/04/19  Interventions for Short Term Goal #2  Will extend to allow adherence with review of the printed infromation sent to pt in managing her Crohn's and a healthy diet to support this condition.      [name], RN Care Management Coordinator [place] Office [phone]

## 2019-07-10 NOTE — Progress Notes (Signed)
COMMUNITY PALLIATIVE CARE RN NOTE  PATIENT NAME: Samantha Mckenzie DOB: [birth year] MRN: [id number]  PRIMARY CARE PROVIDER: Colon Branch, MD  RESPONSIBLE PARTY:  Acct ID - Guarantor Home Phone Work Phone Relationship Acct Type  [id number] Samantha, [phone]  Self P/F     [address], [place], [place] [address]   Covid-19 Pre-screening Negative  PLAN OF CARE and INTERVENTION 1. ADVANCE CARE PLANNING/GOALS OF CARE: Goal is for patient to remain at home with her husband. She has a DNR. 2. PATIENT/CAREGIVER EDUCATION: Pain Management, Nausea Management, Safe Mobility, Medication Management 3. DISEASE STATUS: Joint visit made with Palliative Care SW, Samantha Mckenzie. Patient recently returned home from [facility] for [employer]. She was very complimentary of facility and their staff. She reports pain in her posterior neck at times. PRN Tylenol is helpful. She is ambulatory and states that she has not been utilizing a walker. She is able to perform ADLs independently, but does tire easily. No falls reported while in rehab or since returning back home. She states that she has been eating better and is starting to gain weight. She denies dysphagia and is taking her medications without difficulty. She denies nausea. She does have occasional issues with diarrhea. Imodium remains effective. She has edema noted to her L fingers/hand/arm. She says she has been wearing her compression sleeve but that it does not always help. Her son filled a pill box for her for the week. Reviewed medications in pill box and current medication list. She says that she is looking for someone to pill her pill box weekly. I agreed to fill her pill box weekly on Tuesdays. There are 2 medications, Bentyl and Zofran that she wishes to only take twice daily vs four times daily. She is currently on a Prednisone taper d/t her Crohn's disease. Will continue to monitor.   HISTORY OF PRESENT ILLNESS:  This is a 83 yo female who resides at  home with her husband. Palliative care team continues to follow patient. Will visit patient weekly and PRN.  CODE STATUS: DNR  ADVANCED DIRECTIVES: N MOST FORM: no PPS: 50%   PHYSICAL EXAM:   VITALS: Today's Vitals   07/10/19 1124  BP: 135/60  Pulse: 62  Resp: 18  Temp: (!) 97.3 F (36.3 C)  TempSrc: Temporal  SpO2: 99%  PainSc: 2   PainLoc: Neck    LUNGS: clear to auscultation  CARDIAC: Cor RRR EXTREMITIES: Edema to left fingers/arm/hand, has compression sleeve SKIN: Exposed skin is dry and intact  NEURO: Alert and oriented x 3, pleasant mood, forgetful, ambulatory   (Duration of visit and documentation 90 minutes)    Daryl Eastern, RN BSN

## 2019-07-11 ENCOUNTER — Other Ambulatory Visit: Payer: Self-pay

## 2019-07-12 ENCOUNTER — Encounter: Payer: Self-pay | Admitting: Internal Medicine

## 2019-07-12 ENCOUNTER — Ambulatory Visit (INDEPENDENT_AMBULATORY_CARE_PROVIDER_SITE_OTHER): Payer: Medicare Other | Admitting: Internal Medicine

## 2019-07-12 VITALS — BP 106/58 | HR 72 | Temp 97.4°F | Ht 62.0 in | Wt 121.0 lb

## 2019-07-12 DIAGNOSIS — K50919 Crohn's disease, unspecified, with unspecified complications: Secondary | ICD-10-CM | POA: Diagnosis not present

## 2019-07-12 MED ORDER — PREDNISONE 10 MG PO TABS: ORAL_TABLET | ORAL | 1 refills | Status: DC

## 2019-07-12 NOTE — Progress Notes (Signed)
HISTORY OF PRESENT ILLNESS:  Samantha Mckenzie is a very pleasant 83 y.o. female with multiple medical problems including a history of breast cancer, hypertension, and chronic renal insufficiency who presents today post hospitalization regarding management of newly diagnosed ileal Crohn's disease.  The patient's current history dates back approximately 1 year ago when she was hospitalized for what was felt to be a viral gastroenteritis.  I saw her 2 months later at which time she was felt to have had postinfectious dysmotility.  She was treated with fiber.  Since that time she has had several hospitalizations for problems with abdominal pain diarrhea and weight loss.  Significant pain after eating which led to food avoidance.  Several CT scans revealed ileitis with upstream bowel dilation.  Diagnosed several times with infectious enteritis.  Hospitalized August 2020 and was felt more likely to have Crohn's disease.  She was anemic, hypokalemic, and hypoalbuminemic.  She had had 30 pounds of weight loss.  Weakness.  She underwent colonoscopy with Dr. Fuller Plan June 06, 2019.  The ileum was stenotic and could not be intubated.  Biopsies were nondiagnostic.  Again, however, no deep intubation of the ileum.  The colon was otherwise normal.  She was started on steroids.  A prednisone taper was instructed.  The patient tells me that she went to rehab for about 2 weeks and was not on prednisone.  However she has been for the past 2 weeks.  Overall she is feeling better.  Her abdominal pain is significantly better as is her appetite.  She has gained a few pounds.  Less diarrhea.  Dr. Larose Kells recommended Imodium which is helping.  No bleeding.  She still remains weak.  Tolerating steroids well without appreciable side effects.  She is accompanied today by her husband.  Last CT scan May 30, 2019 with ileitis upstream small bowel dilation.  She is on B12 replacement  REVIEW OF SYSTEMS:  All non-GI ROS negative unless  otherwise stated in the HPI except for anxiety, arthritis, back pain, depression, fatigue, extremity swelling  Past Medical History:  Diagnosis Date  . Allergic rhinitis   . Anemia   . Breast CA ([place])    surgery, chemo, XRT; had peripheral neuropathy (imbalance at times) after chemo  . Cellulitis    Left arm, recurrent   . Cellulitis LEFT arm recurrent 12/08/2011  . Chronic renal disease, stage IV ([facility])    Dr. Moshe Cipro  . Depression   . Fatigue   . GERD (gastroesophageal reflux disease)    gastritis, EGD 02/2007  . Gout   . Hyperlipidemia   . Hypertension   . Macular degeneration 2011  . Normal cardiac stress test 12/2014  . Osteoarthritis   . Renal insufficiency     Past Surgical History:  Procedure Laterality Date  . BIOPSY  06/06/2019   Procedure: BIOPSY;  Surgeon: Ladene Artist, MD;  Location: [facility];  Service: Endoscopy;;  . cataracts bilaterally  1 -2012  . COLONOSCOPY WITH PROPOFOL N/A 06/06/2019   Procedure: COLONOSCOPY WITH PROPOFOL;  Surgeon: Ladene Artist, MD;  Location: [facility] END[facility];  Service: Endoscopy;  Laterality: N/A;  . LUNG BIOPSY  1999   neg  . MASTECTOMY Left 1999   and lymphnodes     Social History DEALIE KOELZER  reports that she has never smoked. She has never used smokeless tobacco. She reports previous alcohol use. She reports that she does not use drugs.  family history includes Breast cancer in an other family  member; Diabetes in her sister; Heart attack (age of onset: 64) in her father; Hypertension in her sister.  Allergies  Allergen Reactions  . Codeine Nausea And Vomiting  . Morphine Nausea And Vomiting  . Oxycodone-Acetaminophen Nausea And Vomiting  . Sulfonamide Derivatives Nausea And Vomiting  . Tramadol Hcl Nausea And Vomiting       PHYSICAL EXAMINATION: Vital signs: BP (!) 106/58 (BP Location: Right Arm, Patient Position: Sitting, Cuff Size: Normal)   Pulse 72   Temp (!) 97.4 F (36.3 C) (Oral)   Ht 5' 2"   (1.575 m)   Wt 121 lb (54.9 kg)   BMI 22.13 kg/m   Constitutional: Thin somewhat frail-appearing, no acute distress Psychiatric: alert and oriented x3, cooperative Eyes: extraocular movements intact, anicteric, conjunctiva pink Mouth: oral pharynx moist, no lesions Neck: supple no lymphadenopathy Cardiovascular: heart regular rate and rhythm, no murmur Lungs: clear to auscultation bilaterally Abdomen: soft, nontender, nondistended, no obvious ascites, no peritoneal signs, normal bowel sounds, no organomegaly Rectal: Omitted Extremities: no clubbing or cyanosis.  1-2+ lower extremity edema bilaterally.  Lymphedema of the left upper extremity Skin: no lesions on visible extremities Neuro: No focal deficits.  Cranial nerves intact  ASSESSMENT:  1.  Crohn's ileitis.  Improved on steroids 2.  Anemia secondary to the same 3.  Weight loss secondary to the same 4.  Abdominal pain secondary to same   PLAN:  1.  Prednisone 40 mg daily during the month of October 2.  Prednisone 30 mg daily from November 1 through November 15 3.  Prednisone 20 mg daily from November 16 through November 30 4.  GI follow-up 2 months.  Starting December 1 continue with prednisone 20 mg daily until further notice (though I should be seeing her around that time).  Follow-up blood work at that time 5.  Contact the office for any questions or problems 25-minute spent face-to-face with the patient.  Greater than 50% of time used for counseling regarding her ileal Crohn's disease

## 2019-07-12 NOTE — Patient Instructions (Signed)
We have sent the following medications to your pharmacy for you to pick up at your convenience:  Prednisone:  Take 2 tablets a day (82m) for one month Take 1.5 tablets a day (344m for 2 weeks Take 1 tablet a day (2053muntil further notice  Please follow up with Dr. PerHenrene Pastor 09/12/2019 at 2:00pm

## 2019-07-14 DIAGNOSIS — Z23 Encounter for immunization: Secondary | ICD-10-CM | POA: Diagnosis not present

## 2019-07-16 ENCOUNTER — Other Ambulatory Visit: Payer: Self-pay | Admitting: Pharmacist

## 2019-07-16 ENCOUNTER — Ambulatory Visit: Payer: Self-pay | Admitting: Pharmacist

## 2019-07-16 NOTE — Patient Outreach (Signed)
[facility] [facility]) Care Management  07/16/2019  Samantha Mckenzie 1936-04-29 [id number]   Patient's son was called to follow up on pill packing. Unfortunately, he did not answer the phone. From chart review, the patient has a home health nurse coming to her home who documented that she will be filling the patient's pill box each week on Tuesdays.  Plan: Call patient/son back in 5-7 business days.  Elayne Guerin, PharmD, [facility] Clinical Pharmacist [phone]

## 2019-07-17 ENCOUNTER — Other Ambulatory Visit: Payer: Self-pay

## 2019-07-17 ENCOUNTER — Other Ambulatory Visit: Payer: Self-pay | Admitting: *Deleted

## 2019-07-17 ENCOUNTER — Other Ambulatory Visit: Payer: Medicare Other | Admitting: *Deleted

## 2019-07-17 ENCOUNTER — Other Ambulatory Visit: Payer: TRICARE For Life (TFL) | Admitting: Licensed Clinical Social Worker

## 2019-07-17 DIAGNOSIS — Z515 Encounter for palliative care: Secondary | ICD-10-CM

## 2019-07-17 NOTE — Patient Outreach (Signed)
[place] [facility]) Care Management  07/17/2019  Samantha Mckenzie 1936/05/28 [id number]    Transition of care-Unsuccessful  RN attempted to contact pt today however unsuccessful. RN able to leave a HIPAA on the mobile number for this pt requesting a call back. Will further engage and update the plan of care at that time.  Will follow up within 4 business day with another outreach call.  [name], RN Care Management Coordinator [place] Office [phone]

## 2019-07-18 ENCOUNTER — Other Ambulatory Visit: Payer: Self-pay

## 2019-07-18 NOTE — Progress Notes (Signed)
COMMUNITY PALLIATIVE CARE RN NOTE  PATIENT NAME: Samantha Mckenzie DOB: 1935/10/12 MRN: [id number]  PRIMARY CARE PROVIDER: Colon Branch, MD  RESPONSIBLE PARTY:  Acct ID - Guarantor Home Phone Work Phone Relationship Acct Type  [id number] SHENICKA, [phone]  Self P/F     [address], [place], [place] [address]   Covid-19 Pre-screening Negative  PLAN OF CARE and INTERVENTION:  1. ADVANCE CARE PLANNING/GOALS OF CARE: Goal is for patient to remain at home with her husband. She is a DNR. 2. PATIENT/CAREGIVER EDUCATION: Safe Mobility 3. DISEASE STATUS: Joint visit made with Palliative Care SW, Lynn Duffy. Met with patient and her husband in their home. Patient denies pain at this time. She usually c/o pain in her neck, but was able to move her neck side to side w/o any pain. She reports occasional stomach discomfort, but feels that the Bentyl and Prednisone is helping with that. She also reports feeling more tired with exertion e.g vacuuming, taking a shower, making her bed. She is requiring frequent rest periods between tasks. She remains ambulatory, and does not use any assistive devices. She does have a walker available. She says that her appetite has improved. She is taking Marinol BID. Denies nausea. She is taking Zofran BID scheduled. Swelling has decreasing in her L arm/hand from lymphedema since last week, but is still present. Her arm feels heavy. She does have a compression sleeve. Filled patient's pill box and will continue to perform this task for her weekly. Advanced care planning completed. MOST form signed in [place] and original copy left in patient's home. Will continue to monitor.   HISTORY OF PRESENT ILLNESS:  This is a 83 yo female who resides at home with her husband. Palliative Care team continues to follow patient. Next visit scheduled in 1 week.   CODE STATUS: DNR  ADVANCED DIRECTIVES: Y MOST FORM: yes PPS: 50%   PHYSICAL EXAM:   VITALS: Today's Vitals   07/17/19 1040  BP: 121/61  Pulse: 71  Resp: 18  Temp: (!) 97.3 F (36.3 C)  TempSrc: Temporal  PainSc: 0-No pain    LUNGS: clear to auscultation  CARDIAC: Cor RRR EXTREMITIES: Swelling to L arm/hand SKIN: Exposed skin is dry and intact  NEURO: Alert and oriented x 3, forgetful, generalized weakness, ambulatory   (Duration of visit and documentation 75 minutes)    Daryl Eastern, RN BSN

## 2019-07-18 NOTE — Progress Notes (Signed)
COMMUNITY PALLIATIVE CARE SW NOTE  PATIENT NAME: Samantha Mckenzie DOB: 04/13/1936 MRN: [id number]  PRIMARY CARE PROVIDER: Paz, Jose E, MD  RESPONSIBLE PARTY:  Acct ID - Guarantor Home Phone Work Phone Relationship Acct Type  [id number] - Redner,ROSAL* [phone]  Self P/F     [address], [place], [place] [address]     PLAN OF CARE and INTERVENTIONS:             1. GOALS OF CARE/ ADVANCE CARE PLANNING:  Goal is for patient to remain in her home with her husband, Samantha Mckenzie.  She has a DNR.OCIAL/EMOTIONAL/SPIRITUAL ASSESSMENT/ INTERVENTIONS:  SW and Palliative Care RN, Monishia Howard, met with patient and her husband in their home.  Samantha Mckenzie said she generally did not feel good, but could not be specific.  She complained of becoming fatigued while changing her sheets on her bed this morning.  Reviewed a MOST form with her.  Pending NP review.  RN reviewed patient's medications.  Her granddaughter filled part of her pill box this week.  Patient did not remember the scheduled visit for today.  SW provided active listening and supportive counseling. 2. PATIENT/CAREGIVER EDUCATION/ COPING:  Patient copes by remaining very independent.. 3. PERSONAL EMERGENCY PLAN:  Family will contact EMS. 4. COMMUNITY RESOURCES COORDINATION/ HEALTH CARE NAVIGATION:  None. 5. FINANCIAL/LEGAL CONCERNS/INTERVENTIONS:  None.     SOCIAL HX:  Social History        Tobacco Use  . Smoking status: Never Smoker  . Smokeless tobacco: Never Used  Substance Use Topics  . Alcohol use: Not Currently    Alcohol/week: 0.0 standard drinks    Comment: rarely    CODE STATUS:  DNR  ADVANCED DIRECTIVES: N MOST FORM COMPLETE:  Pending HOSPICE EDUCATION PROVIDED:  N PPS: Patient reports her appetite is normal.  She ambulates independently. Duration of visit and documentation:  60 minutes.       Z , LCSW    Communication Routing History  None  No questionnaires available.           Orders Placed   None Medication Changes    None   Medication List  Visit Diagnoses    Palliative care encounter   Problem List  Level of Service  Level of Service  THN VISIT FOR NEXTGEN POPULATION [THNPMPM]  LOS History    

## 2019-07-19 ENCOUNTER — Other Ambulatory Visit: Payer: Self-pay

## 2019-07-20 ENCOUNTER — Other Ambulatory Visit: Payer: Self-pay

## 2019-07-20 ENCOUNTER — Telehealth: Payer: Self-pay

## 2019-07-20 ENCOUNTER — Ambulatory Visit (INDEPENDENT_AMBULATORY_CARE_PROVIDER_SITE_OTHER): Payer: Medicare Other | Admitting: Internal Medicine

## 2019-07-20 ENCOUNTER — Encounter: Payer: Self-pay | Admitting: Internal Medicine

## 2019-07-20 VITALS — BP 139/53 | HR 77 | Temp 96.7°F | Resp 16 | Ht 62.0 in | Wt 124.2 lb

## 2019-07-20 DIAGNOSIS — I10 Essential (primary) hypertension: Secondary | ICD-10-CM

## 2019-07-20 DIAGNOSIS — N19 Unspecified kidney failure: Secondary | ICD-10-CM

## 2019-07-20 DIAGNOSIS — L03114 Cellulitis of left upper limb: Secondary | ICD-10-CM | POA: Diagnosis not present

## 2019-07-20 DIAGNOSIS — D649 Anemia, unspecified: Secondary | ICD-10-CM | POA: Diagnosis not present

## 2019-07-20 DIAGNOSIS — F32A Depression, unspecified: Secondary | ICD-10-CM

## 2019-07-20 DIAGNOSIS — F329 Major depressive disorder, single episode, unspecified: Secondary | ICD-10-CM

## 2019-07-20 LAB — CBC WITH DIFFERENTIAL/PLATELET
Basophils Absolute: 0 10*3/uL (ref 0.0–0.1)
Basophils Relative: 0.3 % (ref 0.0–3.0)
Eosinophils Absolute: 0 10*3/uL (ref 0.0–0.7)
Eosinophils Relative: 0.6 % (ref 0.0–5.0)
HCT: 24.7 % — ABNORMAL LOW (ref 36.0–46.0)
Hemoglobin: 8.1 g/dL — ABNORMAL LOW (ref 12.0–15.0)
Lymphocytes Relative: 27.8 % (ref 12.0–46.0)
Lymphs Abs: 1.9 10*3/uL (ref 0.7–4.0)
MCHC: 32.7 g/dL (ref 30.0–36.0)
MCV: 99.5 fl (ref 78.0–100.0)
Monocytes Absolute: 0.8 10*3/uL (ref 0.1–1.0)
Monocytes Relative: 10.9 % (ref 3.0–12.0)
Neutro Abs: 4.2 10*3/uL (ref 1.4–7.7)
Neutrophils Relative %: 60.4 % (ref 43.0–77.0)
Platelets: 319 10*3/uL (ref 150.0–400.0)
RBC: 2.48 Mil/uL — ABNORMAL LOW (ref 3.87–5.11)
RDW: 19.5 % — ABNORMAL HIGH (ref 11.5–15.5)
WBC: 6.9 10*3/uL (ref 4.0–10.5)

## 2019-07-20 LAB — BASIC METABOLIC PANEL
BUN: 78 mg/dL — ABNORMAL HIGH (ref 6–23)
CO2: 25 mEq/L (ref 19–32)
Calcium: 6.7 mg/dL — ABNORMAL LOW (ref 8.4–10.5)
Chloride: 106 mEq/L (ref 96–112)
Creatinine, Ser: 3.71 mg/dL — ABNORMAL HIGH (ref 0.40–1.20)
GFR: 11.72 mL/min — CL (ref >60.00)
Glucose, Bld: 92 mg/dL (ref 70–99)
Potassium: 4.6 mEq/L (ref 3.5–5.1)
Sodium: 143 mEq/L (ref 135–145)

## 2019-07-20 MED ORDER — CEFADROXIL 500 MG PO CAPS: 500.0000 mg | ORAL_CAPSULE | Freq: Two times a day (BID) | ORAL | 0 refills | Status: DC

## 2019-07-20 NOTE — Progress Notes (Signed)
Subjective:    Patient ID: Samantha Mckenzie, female    DOB: [birth year], 83 y.o.   MRN: [id number]  DOS:  07/20/2019 Type of visit - description: Acute visit She has chronic left arm swelling since she had surgery for breast cancer. Edema fluctuates, she has developed  redness & tenderness in the last  24 hours.  Denies any pain.  Otherwise, she feels overall better, still fatigued.  Reports good compliance with medications  Review of Systems Denies fever chills Appetite increased Denies nausea, vomiting or diarrhea  Past Medical History:  Diagnosis Date  . Allergic rhinitis   . Anemia   . Breast CA ([place])    surgery, chemo, XRT; had peripheral neuropathy (imbalance at times) after chemo  . Cellulitis    Left arm, recurrent   . Cellulitis LEFT arm recurrent 12/08/2011  . Chronic renal disease, stage IV (HCC)    Dr. Moshe Cipro  . Depression   . Fatigue   . GERD (gastroesophageal reflux disease)    gastritis, EGD 02/2007  . Gout   . Hyperlipidemia   . Hypertension   . Macular degeneration 2011  . Normal cardiac stress test 12/2014  . Osteoarthritis   . Renal insufficiency     Past Surgical History:  Procedure Laterality Date  . BIOPSY  06/06/2019   Procedure: BIOPSY;  Surgeon: Ladene Artist, MD;  Location: [facility];  Service: Endoscopy;;  . cataracts bilaterally  1 -2012  . COLONOSCOPY WITH PROPOFOL N/A 06/06/2019   Procedure: COLONOSCOPY WITH PROPOFOL;  Surgeon: Ladene Artist, MD;  Location: [facility];  Service: Endoscopy;  Laterality: N/A;  . LUNG BIOPSY  1999   neg  . MASTECTOMY Left 1999   and lymphnodes     Social History   Socioeconomic History  . Marital status: Married    Spouse name: Not on file  . Number of children: 2  . Years of education: Not on file  . Highest education level: Not on file  Occupational History  . Occupation: retired     Fish farm manager: RETIRED  Social Needs  . Financial resource strain: Not on file  . Food insecurity     Worry: Not on file    Inability: Not on file  . Transportation needs    Medical: No    Non-medical: No  Tobacco Use  . Smoking status: Never Smoker  . Smokeless tobacco: Never Used  Substance and Sexual Activity  . Alcohol use: Not Currently    Alcohol/week: 0.0 standard drinks    Comment: rarely  . Drug use: No  . Sexual activity: Not Currently  Lifestyle  . Physical activity    Days per week: Not on file    Minutes per session: Not on file  . Stress: Not on file  Relationships  . Social Herbalist on phone: Not on file    Gets together: Not on file    Attends religious service: Not on file    Active member of club or organization: Not on file    Attends meetings of clubs or organizations: Not on file    Relationship status: Not on file  . Intimate partner violence    Fear of current or ex partner: Not on file    Emotionally abused: Not on file    Physically abused: Not on file    Forced sexual activity: Not on file  Other Topics Concern  . Not on file  Social History Narrative   Lives  w/ husband , 2 children in [place], 2 [place]              Allergies as of 07/20/2019      Reactions   Codeine Nausea And Vomiting   Morphine Nausea And Vomiting   Oxycodone-acetaminophen Nausea And Vomiting   Sulfonamide Derivatives Nausea And Vomiting   Tramadol Hcl Nausea And Vomiting      Medication List       Accurate as of July 20, 2019 10:07 AM. If you have any questions, ask your nurse or doctor.        acetaminophen 325 MG tablet Commonly known as: TYLENOL Take 2 tablets (650 mg total) by mouth every 6 (six) hours as needed for mild pain, fever or headache (or Fever >/= 101).   allopurinol 100 MG tablet Commonly known as: ZYLOPRIM Take 1 tablet (100 mg total) by mouth daily.   amLODipine 10 MG tablet Commonly known as: NORVASC Take 1 tablet (10 mg total) by mouth daily.   atorvastatin 20 MG tablet Commonly known as: LIPITOR Take 1 tablet (20 mg  total) by mouth daily.   CALTRATE 600+D3 PO Take 1 tablet by mouth 2 (two) times daily.   CENTRUM SILVER PO Take 1 tablet by mouth daily.   dicyclomine 10 MG capsule Commonly known as: BENTYL Take 2 capsules (20 mg total) by mouth 4 (four) times daily -  before meals and at bedtime.   dronabinol 2.5 MG capsule Commonly known as: Marinol Take 1 capsule (2.5 mg total) by mouth 2 (two) times daily before a meal.   escitalopram 10 MG tablet Commonly known as: Lexapro Take 2 tablets (20 mg total) by mouth daily.   feeding supplement (BOOST BREEZE) Liqd Take 237 mLs by mouth daily.   ferrous sulfate 325 (65 FE) MG tablet Take 1 tablet (325 mg total) by mouth daily with breakfast.   loperamide 2 MG tablet Commonly known as: IMODIUM A-D Take 2 mg by mouth 4 (four) times daily as needed for diarrhea or loose stools.   metoprolol tartrate 25 MG tablet Commonly known as: LOPRESSOR Take 1 tablet (25 mg total) by mouth 2 (two) times daily.   mirtazapine 15 MG tablet Commonly known as: REMERON Take 1 tablet (15 mg total) by mouth at bedtime.   ondansetron 4 MG tablet Commonly known as: ZOFRAN Take 1 tablet (4 mg total) by mouth 4 (four) times daily.   pantoprazole 40 MG tablet Commonly known as: PROTONIX Take 1 tablet (40 mg total) by mouth daily before breakfast.   predniSONE 10 MG tablet Commonly known as: DELTASONE Take 1 tablets a day (41m) for one month; then take 1.5 tablets a day (361m for 2 weeks; then take 1 tablet a day (2023muntil further notice   vitamin B-12 1000 MCG tablet Commonly known as: CYANOCOBALAMIN Take 1,000 mcg by mouth daily.   vitamin C 500 MG tablet Commonly known as: ASCORBIC ACID Take 500 mg by mouth daily.   Vitamin D3 25 MCG (1000 UT) Caps Take 1 capsule by mouth daily.           Objective:   Physical Exam BP (!) 139/53 (BP Location: Right Arm, Patient Position: Sitting, Cuff Size: Small)   Pulse 77   Temp (!) 96.7 F (35.9 C)  (Temporal)   Resp 16   Ht 5' 2"  (1.575 m)   Wt 124 lb 4 oz (56.4 kg)   SpO2 100%   BMI 22.73 kg/m  General:  Well developed, NAD, BMI noted. HEENT:  Normocephalic . Face symmetric, atraumatic Lungs:  CTA B Normal respiratory effort, no intercostal retractions, no accessory muscle use. Heart: RRR,  no murmur.  No pretibial edema bilaterally  Upper extremities: Left arm is swollen, + redness, see picture Neurologic:  alert & oriented to self, time, place.  Speech normal, gait appropriate for age and unassisted Psych--  Cooperative with normal attention span and concentration.  Behavior appropriate. No anxious or depressed appearing.        Assessment    Assessment  DM- neuropathy (paresthesias, nl pinprick exam), CKD HTN Hyperlipidemia: Lipitor intolerant? CAD? States see Dr Terrence Dupont, no OV  records. (-) stress test 12-2014 CKD -- sees nephrology Gout GI: Crohn's ileitis DX 05-2019, colonoscopy GERD, nl EGD 01-2015  H/o Depression Leg pain-- RLS vs neuropathy, on gabapentin since 2013, good results H/o anemia --- no iron def , Cscope 2012, normal EGD 01-2015 Breast cancer:  --S/p Surgery (L mastectomy), chemotherapy, XRT --Peripheral neuropathy felt to be due to chemotherapy --Recurrent left arm cellulitis DJD  PLAN Cellulitis, left arm: Recurring issue, , typically responds well to oral antibiotics however there has been a couple of times that she needed IV therapy. Plan: Cefadroxil for 1 week, arm elevation, close observation, see AVS HTN: BP looks good today.  On amlodipine, check a BMP. Anemia: Check a CBC.  Anticipate WBC will be elevated due to prednisone. Crohn's ileitis: On steroids, she definitely feels better. Med compliance: Reports she is doing well, states that palliative care is doing very good job helping her keeping track of her medication Reports she had a flu shot already Depression, capacity to make decisions: Improving Addendum: Creatinine  increased, hemoglobin is still low. RX to increase fluid intake, re-do labs in 2 days from today:  BMP, hemoglobin, iron, ferritin, ionized calcium. DX renal failure.  RTC 3 months

## 2019-07-20 NOTE — Patient Instructions (Addendum)
Please schedule Medicare Wellness with Glenard Haring.   Per our records you are due for an eye exam. Please contact your eye doctor to schedule an appointment. Please have them send copies of your office visit notes to [place]. Our fax number is [phone].   GO TO THE LAB : Get the blood work     GO TO THE FRONT DESK Schedule your next appointment   for checkup in 3 months  ===  You have a infection on your left arm  Please take antibiotic for 1 week  Call if not gradually better  Go to the ER if you have fever, chills or the redness is increasing    Keep the arm elevated  ===  Please take your medications according to the list provided

## 2019-07-20 NOTE — Telephone Encounter (Signed)
Critical Lab: GFR- 11.72  Dr. Larose Kells informed verbally as well.

## 2019-07-20 NOTE — Progress Notes (Signed)
Pre visit review using our clinic review tool, if applicable. No additional management support is needed unless otherwise documented below in the visit note. 

## 2019-07-20 NOTE — Telephone Encounter (Signed)
See labs, will recheck in a couple of days

## 2019-07-22 NOTE — Assessment & Plan Note (Addendum)
Cellulitis, left arm: Recurring issue, , typically responds well to oral antibiotics however there has been a couple of times that she needed IV therapy. Plan: Cefadroxil for 1 week, arm elevation, close observation, see AVS HTN: BP looks good today.  On amlodipine, check a BMP. Anemia: Check a CBC.  Anticipate WBC will be elevated due to prednisone. Crohn's ileitis: On steroids, she definitely feels better. Med compliance: Reports she is doing well, states that palliative care is doing very good job helping her keeping track of her medication Reports she had a flu shot already Depression, capacity to make decisions: Improving Addendum: Creatinine increased, hemoglobin is still low. RX to increase fluid intake, re-do labs in 2 days from today:  BMP, hemoglobin, iron, ferritin, ionized calcium. DX renal failure.  RTC 3 months

## 2019-07-23 ENCOUNTER — Telehealth: Payer: Self-pay | Admitting: Internal Medicine

## 2019-07-23 ENCOUNTER — Other Ambulatory Visit: Payer: Self-pay

## 2019-07-23 ENCOUNTER — Telehealth: Payer: Self-pay | Admitting: Emergency Medicine

## 2019-07-23 ENCOUNTER — Other Ambulatory Visit (INDEPENDENT_AMBULATORY_CARE_PROVIDER_SITE_OTHER): Payer: Medicare Other

## 2019-07-23 ENCOUNTER — Other Ambulatory Visit: Payer: Self-pay | Admitting: *Deleted

## 2019-07-23 DIAGNOSIS — N19 Unspecified kidney failure: Secondary | ICD-10-CM | POA: Diagnosis not present

## 2019-07-23 LAB — IRON: Iron: 57 ug/dL (ref 42–145)

## 2019-07-23 LAB — BASIC METABOLIC PANEL
BUN: 79 mg/dL — ABNORMAL HIGH (ref 6–23)
CO2: 23 mEq/L (ref 19–32)
Calcium: 6.4 mg/dL — ABNORMAL LOW (ref 8.4–10.5)
Chloride: 103 mEq/L (ref 96–112)
Creatinine, Ser: 4.13 mg/dL — ABNORMAL HIGH (ref 0.40–1.20)
GFR: 10.36 mL/min — CL (ref >60.00)
Glucose, Bld: 100 mg/dL — ABNORMAL HIGH (ref 70–99)
Potassium: 4.4 mEq/L (ref 3.5–5.1)
Sodium: 140 mEq/L (ref 135–145)

## 2019-07-23 LAB — FERRITIN: Ferritin: 298.5 ng/mL — ABNORMAL HIGH (ref 10.0–291.0)

## 2019-07-23 LAB — HEMOGLOBIN: Hemoglobin: 7.4 g/dL — CL (ref 12.0–15.0)

## 2019-07-23 MED ORDER — PREDNISONE 20 MG PO TABS: ORAL_TABLET | ORAL | 3 refills | Status: DC

## 2019-07-23 NOTE — Telephone Encounter (Signed)
Creatinine increased to 4.1, hemoglobin 7.4, iron normal, ferritin slightly increased. Discussed with Dr. Moshe Cipro, nephrology, appreciate her help. They will see her in a timely manner. I left message to the patient, she is aware of the results, also recommend her to drop amlodipine to half tablet daily to be sure her BP is not too low.

## 2019-07-23 NOTE — Patient Outreach (Signed)
[place]) Care Management  07/23/2019  Samantha Mckenzie [birth year] [id number]  Telephone Assessment-Unsuccessful  RN attempted outreach call however unsuccessful. RN able to leave a HIPAA approved voice message.  Will attempted another outreach call over the next week. Will further engage with update on pt's ongoing management of care.  [name], RN Care Management Coordinator [place] Office [phone]

## 2019-07-23 NOTE — Telephone Encounter (Signed)
"  CRITICAL VALUE STICKER  CRITICAL VALUE:HGB 7.4  RECEIVER (on-site recipient of call):Elysabeth Aust P  DATE & TIME NOTIFIED: 07-23-19  1225  MESSENGER (representative from lab):Karen MD NOTIFIED: Paz/Kaylyn  TIME OF NOTIFICATION:1230  RESPONSE:

## 2019-07-23 NOTE — Telephone Encounter (Signed)
CRITICAL VALUE STICKER  CRITICAL VALUE:  GFR  10.35  RECEIVER (on-site recipient of call):  Kelle Darting, [place] NOTIFIED: 07/23/19 @ 3:12pm  MESSENGER (representative from lab): Hope  MD NOTIFIED:  Larose Kells / Vilma Prader  TIME OF NOTIFICATION: 3:14pm  RESPONSE:

## 2019-07-23 NOTE — Telephone Encounter (Signed)
PCP verbally informed.  

## 2019-07-23 NOTE — Telephone Encounter (Signed)
Patient came here for blood work. She continues feeling well, no fever chills but her left arm seems to be more swelling. On exam, the arm is indeed slightly more swelling.  The cellulitis however is less red and less warm.  See picture. Plan: Continue antibiotics, strongly recommend arm elevation Reassess in 2 to 3 days here at the office. I measured the arm, is about 12 inches in circumference.

## 2019-07-24 ENCOUNTER — Other Ambulatory Visit: Payer: Self-pay | Admitting: Pharmacist

## 2019-07-24 ENCOUNTER — Other Ambulatory Visit: Payer: Medicare Other | Admitting: *Deleted

## 2019-07-24 ENCOUNTER — Telehealth: Payer: Self-pay | Admitting: Internal Medicine

## 2019-07-24 ENCOUNTER — Other Ambulatory Visit: Payer: TRICARE For Life (TFL) | Admitting: Licensed Clinical Social Worker

## 2019-07-24 DIAGNOSIS — Z515 Encounter for palliative care: Secondary | ICD-10-CM

## 2019-07-24 LAB — CALCIUM, IONIZED: Calcium, Ion: 3.61 mg/dL — ABNORMAL LOW (ref 4.8–5.6)

## 2019-07-24 MED ORDER — MIRTAZAPINE 15 MG PO TABS: 15.0000 mg | ORAL_TABLET | Freq: Every day | ORAL | 1 refills | Status: DC

## 2019-07-24 MED ORDER — ONDANSETRON HCL 4 MG PO TABS: 4.0000 mg | ORAL_TABLET | Freq: Four times a day (QID) | ORAL | 0 refills | Status: DC

## 2019-07-24 MED ORDER — PANTOPRAZOLE SODIUM 40 MG PO TBEC: 40.0000 mg | DELAYED_RELEASE_TABLET | Freq: Every day | ORAL | 1 refills | Status: DC

## 2019-07-24 MED ORDER — AMLODIPINE BESYLATE 10 MG PO TABS: 10.0000 mg | ORAL_TABLET | Freq: Every day | ORAL | 1 refills | Status: DC

## 2019-07-24 MED ORDER — DRONABINOL 2.5 MG PO CAPS: 2.5000 mg | ORAL_CAPSULE | Freq: Two times a day (BID) | ORAL | 0 refills | Status: DC

## 2019-07-24 NOTE — Patient Outreach (Addendum)
[name]) Care Management  07/24/2019  Samantha Mckenzie 04-10-1936 [id number]   Patient's son [name]) was called to follow up on pill packing. HIPAA identifiers were obtained. [name] confirmed home health/Palliative Care Nurse has been filling his mother's pill box and that they are not interested in pursing pill packing at this time.  [name] said he would call me back if further intervention is needed.  Patient has [employer] as is therefore not eligible for patient assistance programming.  Plan: Close patient's pharmacy case. Alert [facility] Nurse.   Elayne Guerin, PharmD, [place] Clinical Pharmacist [phone]

## 2019-07-24 NOTE — Telephone Encounter (Signed)
Okay to refill amlodipine, mirtazapine Protonix: 1 month and 1 refill Dronabinol okay #60 Zofran, okay #60

## 2019-07-24 NOTE — Telephone Encounter (Signed)
-----   Message from Conan Bowens, RN sent at 07/24/2019 12:35 PM EDT ----- Regarding: Medication Refills Needed via [facility] Good afternoon Dr. Larose Kells,  I am currently at this patient's home and have taken over the responsibility of filling her pill box weekly on Tuesdays. She needs several refills for her pill box fill next week.   1. Amlodopine Besylate 10 mg po daily 2. Dronabinol 2.5 mg po twice daily 3. Zofran 13m 1 tab po four times daily (she only wants twice daily in her pill box) 4. Mirtazapine 129mpo at bedtime 5. Protonix 40 mg po daily  I have a question whether she should be taking Iron supplements [id number]) po daily with breakfast because she has not been taking this. She informed me she has a visit scheduled with you this week on 07/26/19.   [facility] ([phone]

## 2019-07-24 NOTE — Progress Notes (Signed)
COMMUNITY PALLIATIVE CARE SW NOTE  PATIENT NAME: Samantha Mckenzie DOB: 08-May-1936 MRN: [id number]  PRIMARY CARE PROVIDER: Colon Branch, MD  RESPONSIBLE PARTY:  Acct ID - Guarantor Home Phone Work Phone Relationship Acct Type  [id number] ELISSIA, [phone]  Self P/F     [place], [place], [place] [address]     PLAN OF CARE and INTERVENTIONS:             1. GOALS OF CARE/ ADVANCE CARE PLANNING:  Patient's goal is to remain in her home with her husband, Samantha Mckenzie.  Patient has a DNR and MOST form. 2. SOCIAL/EMOTIONAL/SPIRITUAL ASSESSMENT/ INTERVENTIONS:  SW and Palliative Care RN, Daryl Eastern, met with patient in her home.  She complained of not feeling well with minimal energy.  Her left arm was still swollen and she said she has an appointment with her MD on Thursday.  She reported her mood has been effected due to her inability to perform tasks she used to enjoy.  She is unable to walk long distances.  SW provided active listening and supportive counseling while she discussed her faith. 3. PATIENT/CAREGIVER EDUCATION/ COPING:  Patient copes by relying on her [place]. 4. PERSONAL EMERGENCY PLAN:  She will contact EMS. 5. COMMUNITY RESOURCES COORDINATION/ HEALTH CARE NAVIGATION:  None. 6. FINANCIAL/LEGAL CONCERNS/INTERVENTIONS:  None.     SOCIAL HX:  Social History   Tobacco Use  . Smoking status: Never Smoker  . Smokeless tobacco: Never Used  Substance Use Topics  . Alcohol use: Not Currently    Alcohol/week: 0.0 standard drinks    Comment: rarely    CODE STATUS:  DNR  ADVANCED DIRECTIVES: N MOST FORM COMPLETE:  Y HOSPICE EDUCATION PROVIDED: N PPS:  Patient's appetite is normal.  She independently ambulates. Duration of visit and documentation:  75 minutes.      Creola Corn Jenee Spaugh, LCSW

## 2019-07-24 NOTE — Telephone Encounter (Signed)
Rx's sent. Dronabinol faxed to [facility].

## 2019-07-25 ENCOUNTER — Other Ambulatory Visit: Payer: Self-pay

## 2019-07-25 DIAGNOSIS — Z6826 Body mass index (BMI) 26.0-26.9, adult: Secondary | ICD-10-CM | POA: Diagnosis not present

## 2019-07-25 DIAGNOSIS — E875 Hyperkalemia: Secondary | ICD-10-CM | POA: Diagnosis not present

## 2019-07-25 DIAGNOSIS — K5010 Crohn's disease of large intestine without complications: Secondary | ICD-10-CM | POA: Diagnosis not present

## 2019-07-25 DIAGNOSIS — N184 Chronic kidney disease, stage 4 (severe): Secondary | ICD-10-CM | POA: Diagnosis not present

## 2019-07-25 DIAGNOSIS — E785 Hyperlipidemia, unspecified: Secondary | ICD-10-CM | POA: Diagnosis not present

## 2019-07-25 DIAGNOSIS — M109 Gout, unspecified: Secondary | ICD-10-CM | POA: Diagnosis not present

## 2019-07-25 DIAGNOSIS — I129 Hypertensive chronic kidney disease with stage 1 through stage 4 chronic kidney disease, or unspecified chronic kidney disease: Secondary | ICD-10-CM | POA: Diagnosis not present

## 2019-07-25 DIAGNOSIS — D631 Anemia in chronic kidney disease: Secondary | ICD-10-CM | POA: Diagnosis not present

## 2019-07-25 NOTE — Progress Notes (Signed)
COMMUNITY PALLIATIVE CARE RN NOTE  PATIENT NAME: Samantha Mckenzie DOB: 07-Feb-1936 MRN: [id number]  PRIMARY CARE PROVIDER: Colon Branch, MD  RESPONSIBLE PARTY:  Acct ID - Guarantor Home Phone Work Phone Relationship Acct Type  [id number] JASLEEN, [phone]  Self P/F     [address], [place], [place] [address]   Covid-19 Pre-screening Negative  PLAN OF CARE and INTERVENTION:  1. ADVANCE CARE PLANNING/GOALS OF CARE: Goal is for patient to remain at home with her husband. She has a DNR and MOST form. 2. PATIENT/CAREGIVER EDUCATION: Safe Mobility 3. DISEASE STATUS: Joint visit made with Palliative Care SW, Lynn Duffy. Met with patient in her home. She is awake and alert and able to engage in appropriate conversation. She denies pain at this time. She says that she does not feel well. Describes being tired all the time and unable to perform household chores without frequent rest periods in between. She spoke about her last hospitalization and how she required a blood transfusion. She wonders if her Hgb is low again. She reports having lab work drawn yesterday and she has an appointment with her PCP this week on 07/26/19. She is also concerned with her L arm edema. She has a compression sleeve she wears at times, but says that it is not helping. Her L arm feels heavy and she is going to have her doctor assess this during her appointment this week. I filled her pill box on today and will continue to fill weekly. Communication sent to PCP to inform of refills patient needs prior to the next pill box fill, as she uses [facility] for most of her medications. Her appetite is improving. She denies n/v or any abdominal discomfort at this time. Will continue to monitor.  HISTORY OF PRESENT ILLNESS: This is a 83 yo female who resides at home with her husband. Palliative care team continues to follow patient. Next visit scheduled in 1 week.    CODE STATUS: DNR ADVANCED DIRECTIVES: Y MOST FORM:  yes PPS: 50%   (Duration of visit and documentation 60 minutes)   Daryl Eastern, RN BSN

## 2019-07-26 ENCOUNTER — Other Ambulatory Visit: Payer: Self-pay

## 2019-07-26 ENCOUNTER — Ambulatory Visit (INDEPENDENT_AMBULATORY_CARE_PROVIDER_SITE_OTHER): Payer: Medicare Other | Admitting: Internal Medicine

## 2019-07-26 ENCOUNTER — Encounter: Payer: Self-pay | Admitting: Internal Medicine

## 2019-07-26 VITALS — BP 132/47 | HR 70 | Temp 97.0°F | Resp 16 | Ht 62.0 in | Wt 128.4 lb

## 2019-07-26 DIAGNOSIS — N19 Unspecified kidney failure: Secondary | ICD-10-CM | POA: Diagnosis not present

## 2019-07-26 DIAGNOSIS — L03114 Cellulitis of left upper limb: Secondary | ICD-10-CM

## 2019-07-26 MED ORDER — DOXYCYCLINE HYCLATE 100 MG PO TABS: 100.0000 mg | ORAL_TABLET | Freq: Two times a day (BID) | ORAL | 0 refills | Status: DC

## 2019-07-26 NOTE — Progress Notes (Signed)
Pre visit review using our clinic review tool, if applicable. No additional management support is needed unless otherwise documented below in the visit note. 

## 2019-07-26 NOTE — Patient Instructions (Signed)
  GO TO THE FRONT DESK Schedule your next appointment   for a checkup in 6 to 7 days  Stop the current antibiotic  Start doxycycline twice a day  Continue elevating your arm, this is very important  Call or go to the ER if you notice the redness is getting worse, you have fever or chills

## 2019-07-26 NOTE — Progress Notes (Signed)
Subjective:    Patient ID: Samantha Mckenzie, female    DOB: 06/21/1936, 83 y.o.   MRN: [id number]  DOS:  07/26/2019 Type of visit - description: Follow-up Here for follow-up on left arm cellulitis. Good compliance with antibiotics, swelling has decreased to some extent.   Review of Systems Denies fever chills No nausea, vomiting, diarrhea  Past Medical History:  Diagnosis Date  . Allergic rhinitis   . Anemia   . Breast CA ([place])    surgery, chemo, XRT; had peripheral neuropathy (imbalance at times) after chemo  . Cellulitis    Left arm, recurrent   . Cellulitis LEFT arm recurrent 12/08/2011  . Chronic renal disease, stage IV (HCC)    Dr. Moshe Cipro  . Depression   . Fatigue   . GERD (gastroesophageal reflux disease)    gastritis, EGD 02/2007  . Gout   . Hyperlipidemia   . Hypertension   . Macular degeneration 2011  . Normal cardiac stress test 12/2014  . Osteoarthritis   . Renal insufficiency     Past Surgical History:  Procedure Laterality Date  . BIOPSY  06/06/2019   Procedure: BIOPSY;  Surgeon: Ladene Artist, MD;  Location: [facility];  Service: Endoscopy;;  . cataracts bilaterally  1 -2012  . COLONOSCOPY WITH PROPOFOL N/A 06/06/2019   Procedure: COLONOSCOPY WITH PROPOFOL;  Surgeon: Ladene Artist, MD;  Location: [facility];  Service: Endoscopy;  Laterality: N/A;  . LUNG BIOPSY  1999   neg  . MASTECTOMY Left 1999   and lymphnodes     Social History   Socioeconomic History  . Marital status: Married    Spouse name: Not on file  . Number of children: 2  . Years of education: Not on file  . Highest education level: Not on file  Occupational History  . Occupation: retired     Fish farm manager: RETIRED  Social Needs  . Financial resource strain: Not on file  . Food insecurity    Worry: Not on file    Inability: Not on file  . Transportation needs    Medical: No    Non-medical: No  Tobacco Use  . Smoking status: Never Smoker  . Smokeless tobacco:  Never Used  Substance and Sexual Activity  . Alcohol use: Not Currently    Alcohol/week: 0.0 standard drinks    Comment: rarely  . Drug use: No  . Sexual activity: Not Currently  Lifestyle  . Physical activity    Days per week: Not on file    Minutes per session: Not on file  . Stress: Not on file  Relationships  . Social Herbalist on phone: Not on file    Gets together: Not on file    Attends religious service: Not on file    Active member of club or organization: Not on file    Attends meetings of clubs or organizations: Not on file    Relationship status: Not on file  . Intimate partner violence    Fear of current or ex partner: Not on file    Emotionally abused: Not on file    Physically abused: Not on file    Forced sexual activity: Not on file  Other Topics Concern  . Not on file  Social History Narrative   Lives w/ husband , 2 children in [place], 2 [place] as of 07/26/2019      Reactions  Codeine Nausea And Vomiting   Morphine Nausea And Vomiting   Oxycodone-acetaminophen Nausea And Vomiting   Sulfonamide Derivatives Nausea And Vomiting   Tramadol Hcl Nausea And Vomiting      Medication List       Accurate as of July 26, 2019 11:59 PM. If you have any questions, ask your nurse or doctor.        STOP taking these medications   cefadroxil 500 MG capsule Commonly known as: DURICEF Stopped by: Kathlene November, MD     TAKE these medications   acetaminophen 325 MG tablet Commonly known as: TYLENOL Take 2 tablets (650 mg total) by mouth every 6 (six) hours as needed for mild pain, fever or headache (or Fever >/= 101).   allopurinol 100 MG tablet Commonly known as: ZYLOPRIM Take 1 tablet (100 mg total) by mouth daily.   amLODipine 10 MG tablet Commonly known as: NORVASC Take 1 tablet (10 mg total) by mouth daily.   atorvastatin 20 MG tablet Commonly known as: LIPITOR Take 1 tablet (20 mg total) by mouth daily.   CALTRATE  600+D3 PO Take 1 tablet by mouth 2 (two) times daily.   CENTRUM SILVER PO Take 1 tablet by mouth daily.   dicyclomine 10 MG capsule Commonly known as: BENTYL Take 2 capsules (20 mg total) by mouth 4 (four) times daily -  before meals and at bedtime.   doxycycline 100 MG tablet Commonly known as: VIBRA-TABS Take 1 tablet (100 mg total) by mouth 2 (two) times daily. Started by: Kathlene November, MD   dronabinol 2.5 MG capsule Commonly known as: Marinol Take 1 capsule (2.5 mg total) by mouth 2 (two) times daily before a meal.   escitalopram 10 MG tablet Commonly known as: Lexapro Take 2 tablets (20 mg total) by mouth daily.   feeding supplement (BOOST BREEZE) Liqd Take 237 mLs by mouth daily.   ferrous sulfate 325 (65 FE) MG tablet Take 1 tablet (325 mg total) by mouth daily with breakfast.   loperamide 2 MG tablet Commonly known as: IMODIUM A-D Take 2 mg by mouth 4 (four) times daily as needed for diarrhea or loose stools.   metoprolol tartrate 25 MG tablet Commonly known as: LOPRESSOR Take 1 tablet (25 mg total) by mouth 2 (two) times daily.   mirtazapine 15 MG tablet Commonly known as: REMERON Take 1 tablet (15 mg total) by mouth at bedtime.   ondansetron 4 MG tablet Commonly known as: ZOFRAN Take 1 tablet (4 mg total) by mouth 4 (four) times daily.   pantoprazole 40 MG tablet Commonly known as: PROTONIX Take 1 tablet (40 mg total) by mouth daily before breakfast.   predniSONE 10 MG tablet Commonly known as: DELTASONE Take 1 tablets a day (39m) for one month; then take 1.5 tablets a day (328m for 2 weeks; then take 1 tablet a day (2066muntil further notice   predniSONE 20 MG tablet Commonly known as: Deltasone Prednisone 40 mg daily during the month of October 2.  Prednisone 30 mg daily from November 1 through November 15 3.  Prednisone 20 mg daily from November 16 until further notice   vitamin B-12 1000 MCG tablet Commonly known as: CYANOCOBALAMIN Take 1,000  mcg by mouth daily.   vitamin C 500 MG tablet Commonly known as: ASCORBIC ACID Take 500 mg by mouth daily.   Vitamin D3 25 MCG (1000 UT) Caps Take 1 capsule by mouth daily.  Objective:   Physical Exam BP (!) 132/47 (BP Location: Right Arm, Patient Position: Sitting, Cuff Size: Small)   Pulse 70   Temp (!) 97 F (36.1 C) (Temporal)   Resp 16   Ht 5' 2"  (1.575 m)   Wt 128 lb 6 oz (58.2 kg)   SpO2 97%   BMI 23.48 kg/m  General:   Well developed, NAD, BMI noted. HEENT:  Normocephalic . Face symmetric, atraumatic Upper extremity: Still red, less swollen, still slightly warm. Forearm is measured:  11 3/4 inches  Hand is not swollen today. Neurologic:  alert & oriented X3.  Speech normal  Psych--  Cognition and judgment appear intact.  Cooperative with normal attention span and concentration.  Behavior appropriate. No anxious or depressed appearing.          Assessment     Assessment  DM- neuropathy (paresthesias, nl pinprick exam), CKD HTN Hyperlipidemia: Lipitor intolerant? CAD? States see Dr Terrence Dupont, no OV  records. (-) stress test 12-2014 CKD -- sees nephrology Gout GI: Crohn's ileitis DX 05-2019, colonoscopy GERD, nl EGD 01-2015  H/o Depression Leg pain-- RLS vs neuropathy, on gabapentin since 2013, good results H/o anemia --- no iron def , Cscope 2012, normal EGD 01-2015 Breast cancer:  --S/p Surgery (L mastectomy), chemotherapy, XRT --Peripheral neuropathy felt to be due to chemotherapy --Recurrent left arm cellulitis DJD  PLAN Cellulitis, left arm On  Duricef, very mild improvement, swelling has decreased from 12 inches to 11 3/4 inches at the forearm. The patient will be very reluctant to be admitted for IV antibiotics. Plan: DC Duricef, start doxycycline (no need for renal function adjustment), arm elevation, recheck next week, ER if fever, chills, redness spreads. Renal failure: Patient reports she has been contacted by nephrology  and they are planning an outpatient transfusion

## 2019-07-27 ENCOUNTER — Other Ambulatory Visit: Payer: TRICARE For Life (TFL) | Admitting: Internal Medicine

## 2019-07-27 ENCOUNTER — Other Ambulatory Visit: Payer: Self-pay | Admitting: *Deleted

## 2019-07-27 DIAGNOSIS — Z515 Encounter for palliative care: Secondary | ICD-10-CM

## 2019-07-27 NOTE — Progress Notes (Signed)
    [facility] Palliative Care Consult Note Telephone: [phone]  Fax: [phone]  PATIENT NAME: Samantha Mckenzie DOB: 12/10/1935 MRN: [id number]  PRIMARY CARE PROVIDER:   Colon Branch, MD  REFERRING PROVIDER:  Colon Branch, MD [address] [place],   [id number]  RESPONSIBLE PARTY:   Self     RECOMMENDATIONS and PLAN:  Psllistive care encounter Z51.5  1.Advance care planning:  Reiewed MOST form and confirmed pt's selections with her.  Her advanced directives are DNAR/DNI, comfort measures, trial IV fluids,  determine use of antibiotics, no tube feedings. MOST and DNR forms were signed and uploaded into chart. Palliative care will continue to f/u with patient.    Due to the COVID-19 crisis, this visit was performed telephonically and was initiated by the patient and or family.  I spent 30 minutes providing this consultation,  from 1615 to 1645. More than 50% of the time in this consultation was spent coordinating communication with patient and reviewed medical records. .    CODE STATUS: DNAR/DNI  PPS: 50% HOSPICE ELIGIBILITY/DIAGNOSIS: TBD  PAST MEDICAL HISTORY:  Past Medical History:  Diagnosis Date  . Allergic rhinitis   . Anemia   . Breast CA ([place])    surgery, chemo, XRT; had peripheral neuropathy (imbalance at times) after chemo  . Cellulitis    Left arm, recurrent   . Cellulitis LEFT arm recurrent 12/08/2011  . Chronic renal disease, stage IV (HCC)    Dr. Moshe Cipro  . Depression   . Fatigue   . GERD (gastroesophageal reflux disease)    gastritis, EGD 02/2007  . Gout   . Hyperlipidemia   . Hypertension   . Macular degeneration 2011  . Normal cardiac stress test 12/2014  . Osteoarthritis   . Renal insufficiency       PHYSICAL EXAM:   General: NA Pulmonary: no forced speech Neurological: Alert and oriented  Gonzella Lex, NP-C

## 2019-07-27 NOTE — Assessment & Plan Note (Signed)
Cellulitis, left arm On  Duricef, very mild improvement, swelling has decreased from 12 inches to 11 3/4 inches at the forearm. The patient will be very reluctant to be admitted for IV antibiotics. Plan: DC Duricef, start doxycycline (no need for renal function adjustment), arm elevation, recheck next week, ER if fever, chills, redness spreads. Renal failure: Patient reports she has been contacted by nephrology and they are planning an outpatient transfusion

## 2019-07-27 NOTE — Patient Outreach (Signed)
[facility]) Care Management  07/27/2019  Samantha Mckenzie May 06, 1936 [id number]   Telephone Assessment-Successful (Decline)   RN spoke with pt today and inquired further on her ongoing management of care. RN inquired on her medications as pt states she has someone now to come in and fill her pill box. Pt's son [name] consulted with [facility]). RN attempted to obtain additional initial assessment information however pt opt to decline any further consultation with [facility]. States she no longer needs services and has more then enough people involved with her care. RN offered to just follow up quarterly not to disturb any other services involved to update her provider on how she is doing. Pt again declined and does not wish to receive any additional call via [facility] services indicated he is "fine".   Note pt was enrolled via prevention measures (OTHER) however due to pt's request not to be follow or called will close this case based upon pt's request. Pt is aware her provider will be alerted of her dis-enrollment at this time. Note RN made several attempts to continue [facility] services if at a minimal but pt was very admant with her decision to decline all [facility] services at this time based upon her involvement with all other services. Note receiving Palliative home visit and again pill box refills. No other request or inquires at this time.   Will closed this case from an further contacts on this referral and goals at this time will be inactive.  [name], RN Care Management Coordinator [place] Office [phone]

## 2019-07-30 ENCOUNTER — Other Ambulatory Visit: Payer: Self-pay

## 2019-07-31 ENCOUNTER — Other Ambulatory Visit: Payer: Self-pay

## 2019-07-31 ENCOUNTER — Other Ambulatory Visit: Payer: TRICARE For Life (TFL) | Admitting: Licensed Clinical Social Worker

## 2019-07-31 ENCOUNTER — Other Ambulatory Visit: Payer: Medicare Other | Admitting: *Deleted

## 2019-07-31 DIAGNOSIS — Z515 Encounter for palliative care: Secondary | ICD-10-CM

## 2019-07-31 NOTE — Progress Notes (Signed)
COMMUNITY PALLIATIVE CARE SW NOTE  PATIENT NAME: Samantha Mckenzie DOB: 02-14-1936 MRN: [id number]  PRIMARY CARE PROVIDER: Colon Branch, MD  RESPONSIBLE PARTY:  Acct ID - Guarantor Home Phone Work Phone Relationship Acct Type  [id number] ALLEENE, [phone]  Self P/F     [address], [place], [place] [address]     PLAN OF CARE and INTERVENTIONS:             1. GOALS OF CARE/ ADVANCE CARE PLANNING:  Goal is for patient to remain in her home with her husband.  She has a DNR and MOST form. 2. SOCIAL/EMOTIONAL/SPIRITUAL ASSESSMENT/ INTERVENTIONS:  SW and Palliative Care RN, Daryl Eastern, met with patient in her home.  Her husband, Samantha Mckenzie, remained in his office.  Patient continues to express a general feeling of fatigue.  She wishes she could walk outside of the house.  She went to the store the other day with her husband, and said she became so tired, she left.  She does not wish to use the electric scooters in stores.  Patient participated in life review and discussed her relationship with her sons.  SW provided active listening and supportive counseling.  She expressed appreciation for the RN filling her pill box. 3. PATIENT/CAREGIVER EDUCATION/ COPING:  She copes by utilizing her Samantha Mckenzie faith. 4. PERSONAL EMERGENCY PLAN:  Family will contact EMS. 5. COMMUNITY RESOURCES COORDINATION/ HEALTH CARE NAVIGATION:  None. 6. FINANCIAL/LEGAL CONCERNS/INTERVENTIONS:  None.     SOCIAL HX:  Social History   Tobacco Use  . Smoking status: Never Smoker  . Smokeless tobacco: Never Used  Substance Use Topics  . Alcohol use: Not Currently    Alcohol/week: 0.0 standard drinks    Comment: rarely    CODE STATUS:  DNR ADVANCED DIRECTIVES: N MOST FORM COMPLETE:  Y HOSPICE EDUCATION PROVIDED: N PPS:   Patient reports her appetite it normal.  She ambulates independently. Duration of visit and documentation:  75 minutes.      Samantha Corn Aviva Wolfer, LCSW

## 2019-08-02 ENCOUNTER — Encounter (HOSPITAL_COMMUNITY): Payer: Self-pay | Admitting: Emergency Medicine

## 2019-08-02 ENCOUNTER — Encounter: Payer: Self-pay | Admitting: Internal Medicine

## 2019-08-02 ENCOUNTER — Emergency Department (HOSPITAL_COMMUNITY): Payer: Medicare Other

## 2019-08-02 ENCOUNTER — Inpatient Hospital Stay (HOSPITAL_COMMUNITY)
Admission: EM | Admit: 2019-08-02 | Discharge: 2019-08-10 | DRG: 674 | Disposition: A | Discharge status: Home Health | Payer: Medicare Other | Source: Ambulatory Visit | Attending: Internal Medicine | Admitting: Internal Medicine

## 2019-08-02 ENCOUNTER — Ambulatory Visit (INDEPENDENT_AMBULATORY_CARE_PROVIDER_SITE_OTHER): Payer: Medicare Other | Admitting: Internal Medicine

## 2019-08-02 ENCOUNTER — Other Ambulatory Visit: Payer: Self-pay

## 2019-08-02 VITALS — BP 130/46 | HR 69 | Temp 96.8°F | Resp 16 | Ht 62.0 in | Wt 133.5 lb

## 2019-08-02 DIAGNOSIS — E1122 Type 2 diabetes mellitus with diabetic chronic kidney disease: Secondary | ICD-10-CM | POA: Diagnosis present

## 2019-08-02 DIAGNOSIS — R609 Edema, unspecified: Secondary | ICD-10-CM | POA: Diagnosis not present

## 2019-08-02 DIAGNOSIS — E118 Type 2 diabetes mellitus with unspecified complications: Secondary | ICD-10-CM | POA: Diagnosis not present

## 2019-08-02 DIAGNOSIS — I5032 Chronic diastolic (congestive) heart failure: Secondary | ICD-10-CM | POA: Diagnosis not present

## 2019-08-02 DIAGNOSIS — Z8249 Family history of ischemic heart disease and other diseases of the circulatory system: Secondary | ICD-10-CM | POA: Diagnosis not present

## 2019-08-02 DIAGNOSIS — K5000 Crohn's disease of small intestine without complications: Secondary | ICD-10-CM | POA: Diagnosis present

## 2019-08-02 DIAGNOSIS — Z853 Personal history of malignant neoplasm of breast: Secondary | ICD-10-CM | POA: Diagnosis not present

## 2019-08-02 DIAGNOSIS — Z66 Do not resuscitate: Secondary | ICD-10-CM | POA: Diagnosis present

## 2019-08-02 DIAGNOSIS — D539 Nutritional anemia, unspecified: Secondary | ICD-10-CM | POA: Diagnosis present

## 2019-08-02 DIAGNOSIS — E872 Acidosis, unspecified: Secondary | ICD-10-CM | POA: Diagnosis present

## 2019-08-02 DIAGNOSIS — Z9221 Personal history of antineoplastic chemotherapy: Secondary | ICD-10-CM

## 2019-08-02 DIAGNOSIS — Z20828 Contact with and (suspected) exposure to other viral communicable diseases: Secondary | ICD-10-CM | POA: Diagnosis present

## 2019-08-02 DIAGNOSIS — Z923 Personal history of irradiation: Secondary | ICD-10-CM | POA: Diagnosis not present

## 2019-08-02 DIAGNOSIS — I2510 Atherosclerotic heart disease of native coronary artery without angina pectoris: Secondary | ICD-10-CM | POA: Diagnosis present

## 2019-08-02 DIAGNOSIS — E875 Hyperkalemia: Secondary | ICD-10-CM | POA: Diagnosis present

## 2019-08-02 DIAGNOSIS — I10 Essential (primary) hypertension: Secondary | ICD-10-CM | POA: Diagnosis not present

## 2019-08-02 DIAGNOSIS — N281 Cyst of kidney, acquired: Secondary | ICD-10-CM | POA: Diagnosis not present

## 2019-08-02 DIAGNOSIS — K219 Gastro-esophageal reflux disease without esophagitis: Secondary | ICD-10-CM | POA: Diagnosis present

## 2019-08-02 DIAGNOSIS — N184 Chronic kidney disease, stage 4 (severe): Secondary | ICD-10-CM | POA: Diagnosis not present

## 2019-08-02 DIAGNOSIS — E8770 Fluid overload, unspecified: Secondary | ICD-10-CM | POA: Diagnosis present

## 2019-08-02 DIAGNOSIS — Z9119 Patient's noncompliance with other medical treatment and regimen: Secondary | ICD-10-CM | POA: Diagnosis not present

## 2019-08-02 DIAGNOSIS — I972 Postmastectomy lymphedema syndrome: Secondary | ICD-10-CM | POA: Diagnosis present

## 2019-08-02 DIAGNOSIS — R34 Anuria and oliguria: Secondary | ICD-10-CM | POA: Diagnosis not present

## 2019-08-02 DIAGNOSIS — Z7952 Long term (current) use of systemic steroids: Secondary | ICD-10-CM

## 2019-08-02 DIAGNOSIS — Z833 Family history of diabetes mellitus: Secondary | ICD-10-CM

## 2019-08-02 DIAGNOSIS — N186 End stage renal disease: Secondary | ICD-10-CM | POA: Diagnosis not present

## 2019-08-02 DIAGNOSIS — L03114 Cellulitis of left upper limb: Secondary | ICD-10-CM | POA: Diagnosis present

## 2019-08-02 DIAGNOSIS — Z79899 Other long term (current) drug therapy: Secondary | ICD-10-CM

## 2019-08-02 DIAGNOSIS — E785 Hyperlipidemia, unspecified: Secondary | ICD-10-CM | POA: Diagnosis not present

## 2019-08-02 DIAGNOSIS — I132 Hypertensive heart and chronic kidney disease with heart failure and with stage 5 chronic kidney disease, or end stage renal disease: Secondary | ICD-10-CM | POA: Diagnosis not present

## 2019-08-02 DIAGNOSIS — M7989 Other specified soft tissue disorders: Secondary | ICD-10-CM | POA: Diagnosis not present

## 2019-08-02 DIAGNOSIS — R600 Localized edema: Secondary | ICD-10-CM | POA: Diagnosis not present

## 2019-08-02 DIAGNOSIS — I120 Hypertensive chronic kidney disease with stage 5 chronic kidney disease or end stage renal disease: Secondary | ICD-10-CM | POA: Diagnosis not present

## 2019-08-02 DIAGNOSIS — M109 Gout, unspecified: Secondary | ICD-10-CM | POA: Diagnosis present

## 2019-08-02 DIAGNOSIS — I129 Hypertensive chronic kidney disease with stage 1 through stage 4 chronic kidney disease, or unspecified chronic kidney disease: Secondary | ICD-10-CM | POA: Diagnosis not present

## 2019-08-02 DIAGNOSIS — N179 Acute kidney failure, unspecified: Secondary | ICD-10-CM | POA: Diagnosis not present

## 2019-08-02 DIAGNOSIS — F329 Major depressive disorder, single episode, unspecified: Secondary | ICD-10-CM | POA: Diagnosis present

## 2019-08-02 DIAGNOSIS — N185 Chronic kidney disease, stage 5: Secondary | ICD-10-CM | POA: Diagnosis not present

## 2019-08-02 DIAGNOSIS — D631 Anemia in chronic kidney disease: Secondary | ICD-10-CM | POA: Diagnosis not present

## 2019-08-02 DIAGNOSIS — Z4901 Encounter for fitting and adjustment of extracorporeal dialysis catheter: Secondary | ICD-10-CM | POA: Diagnosis not present

## 2019-08-02 LAB — COMPREHENSIVE METABOLIC PANEL
ALT: 26 U/L (ref 0–44)
AST: 20 U/L (ref 15–41)
Albumin: 2.6 g/dL — ABNORMAL LOW (ref 3.5–5.0)
Alkaline Phosphatase: 51 U/L (ref 38–126)
Anion gap: 19 — ABNORMAL HIGH (ref 5–15)
BUN: 108 mg/dL — ABNORMAL HIGH (ref 8–23)
CO2: 15 mmol/L — ABNORMAL LOW (ref 22–32)
Calcium: 6.5 mg/dL — ABNORMAL LOW (ref 8.9–10.3)
Chloride: 101 mmol/L (ref 98–111)
Creatinine, Ser: 6.02 mg/dL — ABNORMAL HIGH (ref 0.44–1.00)
GFR calc Af Amer: 7 mL/min — ABNORMAL LOW (ref >60)
GFR calc non Af Amer: 6 mL/min — ABNORMAL LOW (ref >60)
Glucose, Bld: 207 mg/dL — ABNORMAL HIGH (ref 70–99)
Potassium: 5.1 mmol/L (ref 3.5–5.1)
Sodium: 135 mmol/L (ref 135–145)
Total Bilirubin: 0.4 mg/dL (ref 0.3–1.2)
Total Protein: 6 g/dL — ABNORMAL LOW (ref 6.5–8.1)

## 2019-08-02 LAB — CREATININE, URINE, RANDOM: Creatinine, Urine: 57.29 mg/dL

## 2019-08-02 LAB — CBC WITH DIFFERENTIAL/PLATELET
Abs Immature Granulocytes: 0.02 10*3/uL (ref 0.00–0.07)
Basophils Absolute: 0 10*3/uL (ref 0.0–0.1)
Basophils Relative: 0 %
Eosinophils Absolute: 0 10*3/uL (ref 0.0–0.5)
Eosinophils Relative: 0 %
HCT: 22.6 % — ABNORMAL LOW (ref 36.0–46.0)
Hemoglobin: 7.3 g/dL — ABNORMAL LOW (ref 12.0–15.0)
Immature Granulocytes: 0 %
Lymphocytes Relative: 12 %
Lymphs Abs: 0.6 10*3/uL — ABNORMAL LOW (ref 0.7–4.0)
MCH: 33 pg (ref 26.0–34.0)
MCHC: 32.3 g/dL (ref 30.0–36.0)
MCV: 102.3 fL — ABNORMAL HIGH (ref 80.0–100.0)
Monocytes Absolute: 0.3 10*3/uL (ref 0.1–1.0)
Monocytes Relative: 5 %
Neutro Abs: 4.2 10*3/uL (ref 1.7–7.7)
Neutrophils Relative %: 83 %
Platelets: 322 10*3/uL (ref 150–400)
RBC: 2.21 MIL/uL — ABNORMAL LOW (ref 3.87–5.11)
RDW: 18.1 % — ABNORMAL HIGH (ref 11.5–15.5)
WBC: 5 10*3/uL (ref 4.0–10.5)
nRBC: 0 % (ref 0.0–0.2)

## 2019-08-02 LAB — URINALYSIS, ROUTINE W REFLEX MICROSCOPIC
Bilirubin Urine: NEGATIVE
Glucose, UA: 50 mg/dL — AB
Hgb urine dipstick: NEGATIVE
Ketones, ur: NEGATIVE mg/dL
Leukocytes,Ua: NEGATIVE
Nitrite: NEGATIVE
Protein, ur: NEGATIVE mg/dL
Specific Gravity, Urine: 1.01 (ref 1.005–1.030)
pH: 5 (ref 5.0–8.0)

## 2019-08-02 LAB — LACTIC ACID, PLASMA
Lactic Acid, Venous: 2.3 mmol/L (ref 0.5–1.9)
Lactic Acid, Venous: 4.3 mmol/L (ref 0.5–1.9)

## 2019-08-02 LAB — SODIUM, URINE, RANDOM: Sodium, Ur: 22 mmol/L

## 2019-08-02 LAB — BRAIN NATRIURETIC PEPTIDE: B Natriuretic Peptide: 509.3 pg/mL — ABNORMAL HIGH (ref 0.0–100.0)

## 2019-08-02 MED ORDER — SODIUM CHLORIDE 0.9 % IV SOLN
2.0000 g | INTRAVENOUS | Status: DC
  Administered 2019-08-02: 2 g via INTRAVENOUS
  Filled 2019-08-02: qty 20

## 2019-08-02 MED ORDER — VANCOMYCIN HCL IN DEXTROSE 1-5 GM/200ML-% IV SOLN
1000.0000 mg | Freq: Once | INTRAVENOUS | Status: AC
  Administered 2019-08-02: 1000 mg via INTRAVENOUS
  Filled 2019-08-02: qty 200

## 2019-08-02 NOTE — Progress Notes (Signed)
Pre visit review using our clinic review tool, if applicable. No additional management support is needed unless otherwise documented below in the visit note. 

## 2019-08-02 NOTE — ED Notes (Addendum)
Date and time results received: 08/02/19 2230 (use smartphrase ".now" to insert current time)  Test: Lactic Acid Critical Value: 4.3  Name of Provider Notified: Yoa MD  Orders Received? Or Actions Taken?: Waiting on orders

## 2019-08-02 NOTE — Assessment & Plan Note (Signed)
Cellulitis, left arm: The patient has a history of recurrent cellulitis on the left arm, this time she started antibiotics 07/20/2019, switched from cefadroxil to doxycycline last week, unclear if she is taking the antibiotic.  At this point she is definitely not improving.  Needs IV antibiotics/admission in my opinion. Case discussed with the [facility] the patient. Patient tells me that she is going. Lower extremity edema: Has gained 5 pounds since the last visit, has developed edema, lungs are clear.

## 2019-08-02 NOTE — ED Provider Notes (Signed)
[facility] DEPT Provider Note   CSN: [id number] Arrival date & time: 08/02/19  1455     History   Chief Complaint Chief Complaint  Patient presents with  . Arm Swelling  . Foot Swelling    HPI Samantha Mckenzie is a 83 y.o. female hx of breast cancer here with L arm swelling, leg swelling. Patient has bilateral leg swelling for the last month. About 2 week ago, she developed L arm swelling. She finished a course of cefadroxil and it didn't improve so was switched to doxycycline last week. She states that the redness and swelling has not improved. She denies any fever or chills. Saw PCP this morning and was sent for IV abx and admission. Of note, her kidney function has been worsening over the last month.      The history is provided by the patient.    Past Medical History:  Diagnosis Date  . Allergic rhinitis   . Anemia   . Breast CA ([place])    surgery, chemo, XRT; had peripheral neuropathy (imbalance at times) after chemo  . Cellulitis    Left arm, recurrent   . Cellulitis LEFT arm recurrent 12/08/2011  . Chronic renal disease, stage IV (HCC)    Dr. Moshe Cipro  . Depression   . Fatigue   . GERD (gastroesophageal reflux disease)    gastritis, EGD 02/2007  . Gout   . Hyperlipidemia   . Hypertension   . Macular degeneration 2011  . Normal cardiac stress test 12/2014  . Osteoarthritis   . Renal insufficiency     Patient Active Problem List   Diagnosis Date Noted  . Dementia ([place]) 06/17/2019  . Abnormal CT scan, small bowel   . Dehydration 05/31/2019  . Acute blood loss anemia   . Ileitis 05/30/2019  . Terminal ileitis with complication ([place]) 56/38/9373  . COPD (chronic obstructive pulmonary disease) ([place]) 04/12/2019  . Enteritis 04/07/2019  . Chronic diastolic CHF (congestive heart failure) ([place]) 04/07/2019  . Hypokalemia 04/07/2019  . Hypocalcemia 04/07/2019  . Fall 04/07/2019  . Occult blood positive stool 04/07/2019  .  Adjustment disorder with depressed mood   . Generalized abdominal pain   . Dysphasia 03/21/2019  . CAP (community acquired pneumonia) 03/20/2019  . Nausea vomiting and diarrhea 03/20/2019  . Acute respiratory failure with hypoxia ([place]) 03/20/2019  . Acute renal failure superimposed on stage 4 chronic kidney disease ([place]) 03/20/2019  . Lactic acidosis 03/20/2019  . Diabetic peripheral neuropathy associated with type 2 diabetes mellitus ([place]) 03/20/2019  . Hypomagnesemia 06/21/2018  . Generalized weakness 06/20/2018  . PCP NOTES >>> 07/11/2015  . Sepsis ([place]) 04/19/2013  . Depression   . VITAMIN B12 DEFICIENCY 04/01/2008  . GERD 12/19/2007  . NEOP, MALIGNANT, FEMALE BREAST NOS 07/07/2007  . Osteoarthritis 07/07/2007  . Diabetes mellitus type 2 with complications ([place]) 42/87/6811  . Hyperlipidemia 02/28/2007  . GOUT 02/28/2007  . Normocytic anemia 02/28/2007  . Essential hypertension 02/28/2007    Past Surgical History:  Procedure Laterality Date  . BIOPSY  06/06/2019   Procedure: BIOPSY;  Surgeon: Ladene Artist, MD;  Location: [facility];  Service: Endoscopy;;  . cataracts bilaterally  1 -2012  . COLONOSCOPY WITH PROPOFOL N/A 06/06/2019   Procedure: COLONOSCOPY WITH PROPOFOL;  Surgeon: Ladene Artist, MD;  Location: [facility];  Service: Endoscopy;  Laterality: N/A;  . LUNG BIOPSY  1999   neg  . MASTECTOMY Left 1999   and lymphnodes  OB History   No obstetric history on file.      Home Medications    Prior to Admission medications   Medication Sig Start Date End Date Taking? Authorizing Provider  acetaminophen (TYLENOL) 325 MG tablet Take 2 tablets (650 mg total) by mouth every 6 (six) hours as needed for mild pain, fever or headache (or Fever >/= 101). 04/12/19   Emokpae, Courage, MD  allopurinol (ZYLOPRIM) 100 MG tablet Take 1 tablet (100 mg total) by mouth daily. 06/29/19   Granville Lewis C, PA-C  amLODipine (NORVASC) 10 MG tablet Take 1 tablet (10 mg total)  by mouth daily. 07/24/19   Colon Branch, MD  atorvastatin (LIPITOR) 20 MG tablet Take 1 tablet (20 mg total) by mouth daily. 06/29/19   Wille Celeste, PA-C  Calcium Carb-Cholecalciferol (CALTRATE 600+D3 PO) Take 1 tablet by mouth 2 (two) times daily.     [provider]  Cholecalciferol (VITAMIN D3) 1000 UNITS CAPS Take 1 capsule by mouth daily.     [provider]  dicyclomine (BENTYL) 10 MG capsule Take 2 capsules (20 mg total) by mouth 4 (four) times daily -  before meals and at bedtime. 06/29/19   Granville Lewis C, PA-C  doxycycline (VIBRA-TABS) 100 MG tablet Take 1 tablet (100 mg total) by mouth 2 (two) times daily. Patient not taking: Reported on 08/02/2019 07/26/19   Colon Branch, MD  dronabinol (MARINOL) 2.5 MG capsule Take 1 capsule (2.5 mg total) by mouth 2 (two) times daily before a meal. 07/24/19   Colon Branch, MD  escitalopram (LEXAPRO) 10 MG tablet Take 2 tablets (20 mg total) by mouth daily. 07/06/19   Colon Branch, MD  ferrous sulfate 325 (65 FE) MG tablet Take 1 tablet (325 mg total) by mouth daily with breakfast. 06/29/19 07/29/19  Granville Lewis C, PA-C  loperamide (IMODIUM A-D) 2 MG tablet Take 2 mg by mouth 4 (four) times daily as needed for diarrhea or loose stools.    [provider]  metoprolol tartrate (LOPRESSOR) 25 MG tablet Take 1 tablet (25 mg total) by mouth 2 (two) times daily. 06/29/19   Granville Lewis C, PA-C  mirtazapine (REMERON) 15 MG tablet Take 1 tablet (15 mg total) by mouth at bedtime. 07/24/19   Colon Branch, MD  Multiple Vitamins-Minerals (CENTRUM SILVER PO) Take 1 tablet by mouth daily.     [provider]  Nutritional Supplements (FEEDING SUPPLEMENT, BOOST BREEZE,) LIQD Take 237 mLs by mouth daily.    [provider]  ondansetron (ZOFRAN) 4 MG tablet Take 1 tablet (4 mg total) by mouth 4 (four) times daily. 07/24/19   Colon Branch, MD  pantoprazole (PROTONIX) 40 MG tablet Take 1 tablet (40 mg total) by mouth daily before  breakfast. 07/24/19   Colon Branch, MD  predniSONE (DELTASONE) 10 MG tablet Take 1 tablets a day (72m) for one month; then take 1.5 tablets a day (337m for 2 weeks; then take 1 tablet a day (2052muntil further notice 07/12/19   PerIrene ShipperD  predniSONE (DELTASONE) 20 MG tablet Prednisone 40 mg daily during the month of October 2.  Prednisone 30 mg daily from November 1 through November 15 3.  Prednisone 20 mg daily from November 16 until further notice 07/23/19   PerIrene ShipperD  vitamin B-12 (CYANOCOBALAMIN) 1000 MCG tablet Take 1,000 mcg by mouth daily.      [provider]  vitamin C (ASCORBIC ACID) 500  MG tablet Take 500 mg by mouth daily.    [provider]    Family History Family History  Problem Relation Age of Onset  . Breast cancer Other        ? of   . Heart attack Father 90  . Diabetes Sister        2  . Hypertension Sister   . Colon cancer Neg Hx   . Stomach cancer Neg Hx     Social History Social History   Tobacco Use  . Smoking status: Never Smoker  . Smokeless tobacco: Never Used  Substance Use Topics  . Alcohol use: Not Currently    Alcohol/week: 0.0 standard drinks    Comment: rarely  . Drug use: No     Allergies   Codeine, Morphine, Oxycodone-acetaminophen, Sulfonamide derivatives, and Tramadol hcl   Review of Systems Review of Systems  Musculoskeletal:       L arm swelling and pain   All other systems reviewed and are negative.    Physical Exam Updated Vital Signs BP (!) 137/59   Pulse 67   Temp 97.8 F (36.6 C) (Oral)   Resp 16   SpO2 100%   Physical Exam Vitals signs and nursing note reviewed.  HENT:     Head: Normocephalic.     Nose: Nose normal.     Mouth/Throat:     Mouth: Mucous membranes are moist.  Eyes:     Extraocular Movements: Extraocular movements intact.     Pupils: Pupils are equal, round, and reactive to light.  Neck:     Musculoskeletal: Normal range of motion.  Cardiovascular:      Rate and Rhythm: Normal rate and regular rhythm.     Pulses: Normal pulses.  Pulmonary:     Effort: Pulmonary effort is normal.     Breath sounds: Normal breath sounds.  Abdominal:     General: Abdomen is flat.  Musculoskeletal:     Comments: L forearm swollen and there is swelling of the hand and most of the forearm, able to get radial pulses, able to wiggle fingers and nl sensation of the fingers. 1+ edema bilateral legs   Skin:    General: Skin is warm.     Capillary Refill: Capillary refill takes less than 2 seconds.  Neurological:     General: No focal deficit present.     Mental Status: She is alert.  Psychiatric:        Mood and Affect: Mood normal.      ED Treatments / Results  Labs (all labs ordered are listed, but only abnormal results are displayed) Labs Reviewed  CBC WITH DIFFERENTIAL/PLATELET - Abnormal; Notable for the following components:      Result Value   RBC 2.21 (*)    Hemoglobin 7.3 (*)    HCT 22.6 (*)    MCV 102.3 (*)    RDW 18.1 (*)    Lymphs Abs 0.6 (*)    All other components within normal limits  COMPREHENSIVE METABOLIC PANEL - Abnormal; Notable for the following components:   CO2 15 (*)    Glucose, Bld 207 (*)    BUN 108 (*)    Creatinine, Ser 6.02 (*)    Calcium 6.5 (*)    Total Protein 6.0 (*)    Albumin 2.6 (*)    GFR calc non Af Amer 6 (*)    GFR calc Af Amer 7 (*)    Anion gap 19 (*)  All other components within normal limits  BRAIN NATRIURETIC PEPTIDE - Abnormal; Notable for the following components:   B Natriuretic Peptide 509.3 (*)    All other components within normal limits  LACTIC ACID, PLASMA - Abnormal; Notable for the following components:   Lactic Acid, Venous 4.3 (*)    All other components within normal limits  LACTIC ACID, PLASMA - Abnormal; Notable for the following components:   Lactic Acid, Venous 2.3 (*)    All other components within normal limits  URINALYSIS, ROUTINE W REFLEX MICROSCOPIC - Abnormal; Notable  for the following components:   Glucose, UA 50 (*)    All other components within normal limits  CULTURE, BLOOD (ROUTINE X 2)  CULTURE, BLOOD (ROUTINE X 2)  SODIUM, URINE, RANDOM  CREATININE, URINE, RANDOM  OSMOLALITY, URINE    EKG None  Radiology Dg Forearm Left  Result Date: 08/02/2019 CLINICAL DATA:  Left arm cellulitis. EXAM: LEFT FOREARM - 2 VIEW COMPARISON:  None. FINDINGS: There is no evidence of fracture or other focal bone lesions. No bone erosions identified. There is diffuse soft tissue swelling identified. IMPRESSION: 1. No acute bone abnormality. 2. Diffuse soft tissue swelling. Electronically Signed   By: Kerby Moors M.D.   On: 08/02/2019 21:31   Dg Hand Complete Left  Result Date: 08/02/2019 CLINICAL DATA:  Left arm cellulitis EXAM: LEFT HAND - COMPLETE 3+ VIEW COMPARISON:  None. FINDINGS: There is no evidence of fracture or dislocation. 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 Diffuse dorsal soft tissue edema is noted. No subcutaneous emphysema seen. IMPRESSION: Diffuse dorsal subcutaneous edema surrounding the wrist and hand, likely consistent with cellulitis. Electronically Signed   By: Prudencio Pair M.D.   On: 08/02/2019 21:33    Procedures Procedures (including critical care time)  CRITICAL CARE Performed by: Wandra Arthurs   Total critical care time: 30 minutes  Critical care time was exclusive of separately billable procedures and treating other patients.  Critical care was necessary to treat or prevent imminent or life-threatening deterioration.  Critical care was time spent personally by me on the following activities: development of treatment plan with patient and/or surrogate as well as nursing, discussions with consultants, evaluation of patient's response to treatment, examination of patient, obtaining history from patient or surrogate, ordering and performing treatments and interventions, ordering  and review of laboratory studies, ordering and review of radiographic studies, pulse oximetry and re-evaluation of patient's condition.   Medications Ordered in ED Medications  vancomycin (VANCOCIN) IVPB 1000 mg/200 mL premix (1,000 mg Intravenous New Bag/Given 08/02/19 2244)  cefTRIAXone (ROCEPHIN) 2 g in sodium chloride 0.9 % 100 mL IVPB (0 g Intravenous Stopped 08/02/19 2237)     Initial Impression / Assessment and Plan / ED Course  I have reviewed the triage vital signs and the nursing notes.  Pertinent labs & imaging results that were available during my care of the patient were reviewed by me and considered in my medical decision making (see chart for details).        Samantha Mckenzie is a 83 y.o. female here with L arm swelling and leg swelling.  Had tried 2 courses of antibiotics outpatient and still appears to have cellulitis.  I am concerned that she may have lymphedema in that arm and may have worsening renal failure. Will get labs, lactate, cultures.   11:39 PM Lactate 4.2. WBC nl. Cr is now 6. Hg 7.3. Given IV abx. Talked to  Dr. Posey Pronto from nephrology. He recommend transfer to [facility], get renal US. Hospitalist to admit for cellulitis, sepsis, AKI.    Final Clinical Impressions(s) / ED Diagnoses   Final diagnoses:  None    ED Discharge Orders    None       Drenda Freeze, MD 08/02/19 2340

## 2019-08-02 NOTE — Progress Notes (Signed)
Subjective:    Patient ID: Samantha Mckenzie, female    DOB: 12/03/1935, 83 y.o.   MRN: [id number]  DOS:  08/02/2019 Type of visit - description: Acute, reports she is not better. Since the last visit few days ago I prescribed doxycycline, the patient "thinks" she is taking it. She has been trying to keep her arm elevated but the swelling of the left arm keeps coming back.  Wt Readings from Last 3 Encounters:  08/02/19 133 lb 8 oz (60.6 kg)  07/26/19 128 lb 6 oz (58.2 kg)  07/20/19 124 lb 4 oz (56.4 kg)     Review of Systems No fever, no chills. No arm pain per se. She also has developed lower extremity edema in the last few days. Denies chest pain or difficulty breathing.  Past Medical History:  Diagnosis Date  . Allergic rhinitis   . Anemia   . Breast CA ([place])    surgery, chemo, XRT; had peripheral neuropathy (imbalance at times) after chemo  . Cellulitis    Left arm, recurrent   . Cellulitis LEFT arm recurrent 12/08/2011  . Chronic renal disease, stage IV (HCC)    Dr. Moshe Cipro  . Depression   . Fatigue   . GERD (gastroesophageal reflux disease)    gastritis, EGD 02/2007  . Gout   . Hyperlipidemia   . Hypertension   . Macular degeneration 2011  . Normal cardiac stress test 12/2014  . Osteoarthritis   . Renal insufficiency     Past Surgical History:  Procedure Laterality Date  . BIOPSY  06/06/2019   Procedure: BIOPSY;  Surgeon: Ladene Artist, MD;  Location: [facility];  Service: Endoscopy;;  . cataracts bilaterally  1 -2012  . COLONOSCOPY WITH PROPOFOL N/A 06/06/2019   Procedure: COLONOSCOPY WITH PROPOFOL;  Surgeon: Ladene Artist, MD;  Location: [facility];  Service: Endoscopy;  Laterality: N/A;  . LUNG BIOPSY  1999   neg  . MASTECTOMY Left 1999   and lymphnodes     Social History   Socioeconomic History  . Marital status: Married    Spouse name: Not on file  . Number of children: 2  . Years of education: Not on file  . Highest education  level: Not on file  Occupational History  . Occupation: retired     Fish farm manager: RETIRED  Social Needs  . Financial resource strain: Not on file  . Food insecurity    Worry: Not on file    Inability: Not on file  . Transportation needs    Medical: No    Non-medical: No  Tobacco Use  . Smoking status: Never Smoker  . Smokeless tobacco: Never Used  Substance and Sexual Activity  . Alcohol use: Not Currently    Alcohol/week: 0.0 standard drinks    Comment: rarely  . Drug use: No  . Sexual activity: Not Currently  Lifestyle  . Physical activity    Days per week: Not on file    Minutes per session: Not on file  . Stress: Not on file  Relationships  . Social Herbalist on phone: Not on file    Gets together: Not on file    Attends religious service: Not on file    Active member of club or organization: Not on file    Attends meetings of clubs or organizations: Not on file    Relationship status: Not on file  . Intimate partner violence    Fear of current or ex  partner: Not on file    Emotionally abused: Not on file    Physically abused: Not on file    Forced sexual activity: Not on file  Other Topics Concern  . Not on file  Social History Narrative   Lives w/ husband , 2 children in [place], 2 [place] as of 08/02/2019      Reactions   Codeine Nausea And Vomiting   Morphine Nausea And Vomiting   Oxycodone-acetaminophen Nausea And Vomiting   Sulfonamide Derivatives Nausea And Vomiting   Tramadol Hcl Nausea And Vomiting      Medication List       Accurate as of August 02, 2019 11:47 AM. If you have any questions, ask your nurse or doctor.        acetaminophen 325 MG tablet Commonly known as: TYLENOL Take 2 tablets (650 mg total) by mouth every 6 (six) hours as needed for mild pain, fever or headache (or Fever >/= 101).   allopurinol 100 MG tablet Commonly known as: ZYLOPRIM Take 1 tablet (100 mg total) by mouth daily.   amLODipine  10 MG tablet Commonly known as: NORVASC Take 1 tablet (10 mg total) by mouth daily.   atorvastatin 20 MG tablet Commonly known as: LIPITOR Take 1 tablet (20 mg total) by mouth daily.   CALTRATE 600+D3 PO Take 1 tablet by mouth 2 (two) times daily.   CENTRUM SILVER PO Take 1 tablet by mouth daily.   dicyclomine 10 MG capsule Commonly known as: BENTYL Take 2 capsules (20 mg total) by mouth 4 (four) times daily -  before meals and at bedtime.   doxycycline 100 MG tablet Commonly known as: VIBRA-TABS Take 1 tablet (100 mg total) by mouth 2 (two) times daily.   dronabinol 2.5 MG capsule Commonly known as: Marinol Take 1 capsule (2.5 mg total) by mouth 2 (two) times daily before a meal.   escitalopram 10 MG tablet Commonly known as: Lexapro Take 2 tablets (20 mg total) by mouth daily.   feeding supplement (BOOST BREEZE) Liqd Take 237 mLs by mouth daily.   ferrous sulfate 325 (65 FE) MG tablet Take 1 tablet (325 mg total) by mouth daily with breakfast.   loperamide 2 MG tablet Commonly known as: IMODIUM A-D Take 2 mg by mouth 4 (four) times daily as needed for diarrhea or loose stools.   metoprolol tartrate 25 MG tablet Commonly known as: LOPRESSOR Take 1 tablet (25 mg total) by mouth 2 (two) times daily.   mirtazapine 15 MG tablet Commonly known as: REMERON Take 1 tablet (15 mg total) by mouth at bedtime.   ondansetron 4 MG tablet Commonly known as: ZOFRAN Take 1 tablet (4 mg total) by mouth 4 (four) times daily.   pantoprazole 40 MG tablet Commonly known as: PROTONIX Take 1 tablet (40 mg total) by mouth daily before breakfast.   predniSONE 10 MG tablet Commonly known as: DELTASONE Take 1 tablets a day (23m) for one month; then take 1.5 tablets a day (334m for 2 weeks; then take 1 tablet a day (2041muntil further notice   predniSONE 20 MG tablet Commonly known as: Deltasone Prednisone 40 mg daily during the month of October 2.  Prednisone 30 mg daily from  November 1 through November 15 3.  Prednisone 20 mg daily from November 16 until further notice   vitamin B-12 1000 MCG tablet Commonly known as: CYANOCOBALAMIN Take 1,000 mcg  by mouth daily.   vitamin C 500 MG tablet Commonly known as: ASCORBIC ACID Take 500 mg by mouth daily.   Vitamin D3 25 MCG (1000 UT) Caps Take 1 capsule by mouth daily.           Objective:   Physical Exam BP (!) 130/46 (BP Location: Right Arm, Patient Position: Sitting, Cuff Size: Small)   Pulse 69   Temp (!) 96.8 F (36 C) (Temporal)   Resp 16   Ht 5' 2"  (1.575 m)   Wt 133 lb 8 oz (60.6 kg)   SpO2 98%   BMI 24.42 kg/m     General:   Well developed, NAD, BMI noted. HEENT:  Normocephalic . Face symmetric, atraumatic Lungs:  CTA B Normal respiratory effort, no intercostal retractions, no accessory muscle use. Heart: RRR,  no murmur.  + to  ++/ +++  edema bilateral up to mid pretibial area Left upper extremity: Seems more swollen, red and warm.  Hand is very swollen. I measured the forearm : 13 inches in diameter, worse than before. Neurologic:  alert & oriented X3.  Speech normal, gait appropriate for age and unassisted Psych--  Cognition and judgment appear intact.  Cooperative with normal attention span and concentration.  Behavior appropriate. No anxious or depressed appearing.      Assessment     Assessment  DM- neuropathy (paresthesias, nl pinprick exam), CKD HTN Hyperlipidemia: Lipitor intolerant? CAD? States see Dr Terrence Dupont, no OV  records. (-) stress test 12-2014 CKD -- sees nephrology Gout GI: Crohn's ileitis DX 05-2019, colonoscopy GERD, nl EGD 01-2015  H/o Depression Leg pain-- RLS vs neuropathy, on gabapentin since 2013, good results H/o anemia --- no iron def , Cscope 2012, normal EGD 01-2015 Breast cancer:  --S/p Surgery (L mastectomy), chemotherapy, XRT --Peripheral neuropathy felt to be due to chemotherapy --Recurrent left arm cellulitis DJD  PLAN  Cellulitis, left arm: The patient has a history of recurrent cellulitis on the left arm, this time she started antibiotics 07/20/2019, switched from cefadroxil to doxycycline last week, unclear if she is taking the antibiotic.  At this point she is definitely not improving.  Needs IV antibiotics/admission in my opinion. Case discussed with the ER [facility]. Patient tells me that she is going. Lower extremity edema: Has gained 5 pounds since the last visit, has developed edema, lungs are clear.

## 2019-08-02 NOTE — Patient Instructions (Signed)
Please go to the emergency room at [facility] now I already spoke to the ER doctor let her know about your case.

## 2019-08-02 NOTE — ED Triage Notes (Signed)
Pt c/o left arm being swollen worse than her normal from her breast cancer back in 1999 over the past couple months. Was taking antibiotics. Also having bilat feet swelling-reports her MD told her that nothing can do about that.

## 2019-08-02 NOTE — ED Notes (Signed)
Pt ambulated to and from restroom with assistance without incident.

## 2019-08-02 NOTE — H&P (Signed)
History and Physical    MARQUETA PULLEY IDP:[id number] DOB: June 11, 1936 DOA: 08/02/2019  PCP: Colon Branch, MD   Patient coming from: Home   Chief Complaint: Left arm and bilateral leg swelling   HPI: Samantha Mckenzie is a 83 y.o. female with medical history significant for depression, breast cancer status post left mastectomy/chemotherapy/radiation, anemia, Crohn's ileitis, CAD, and chronic kidney disease stage IV, now presenting to the emergency department for evaluation of progressive left arm swelling and swelling to bilateral legs.  Patient reports occasional left arm swelling since her left mastectomy 20 years ago, but this has been much worse in recent weeks.  She was also experiencing some erythema to the left arm, particularly involving the dorsal wrist and hand, was treated with cefadroxil, continued to worsen, and was switched to doxycycline.  Swelling and redness continued to progress and she was directed to the ED.  She also reports bilateral leg swelling that has been progressive over the past couple weeks.  She denies any pain in the left arm or legs, but notes that they feel heavy.  She denies any chest pain, cough, fever, or shortness of breath.  She denies any melena or hematochezia.  ED Course: Upon arrival to the ED, patient is found to be afebrile, saturating well on room air, and with stable blood pressure.  EKG features a sinus rhythm with nonspecific ST abnormality that appears similar to prior.  Radiographs of the left forearm and left wrist and hand demonstrate soft tissue swelling of the forearm, subcutaneous edema involving the hand and wrist, no acute bony abnormality, and suggestive of cellulitis.  Chemistry panel is concerning for bicarbonate of 15, BUN 108, and creatinine 6.02, up from 3.7 earlier this month and 2.5 last month.  CBC is notable for a macrocytic anemia with hemoglobin 7.3.  BNP is elevated to 509.  Lactic acid elevated 4.3.  Urinalysis notable for glucose  urea without protein or hemoglobin.  Blood and urine cultures were collected, Rocephin and vancomycin were administered, and nephrology was consulted by the ED physician, recommending a medical admission to [facility].  Review of Systems:  All other systems reviewed and apart from HPI, are negative.  Past Medical History:  Diagnosis Date   Allergic rhinitis    Anemia    Breast CA ([place])    surgery, chemo, XRT; had peripheral neuropathy (imbalance at times) after chemo   Cellulitis    Left arm, recurrent    Cellulitis LEFT arm recurrent 12/08/2011   Chronic renal disease, stage IV ([place])    Dr. Moshe Cipro   Depression    Fatigue    GERD (gastroesophageal reflux disease)    gastritis, EGD 02/2007   Gout    Hyperlipidemia    Hypertension    Macular degeneration 2011   Normal cardiac stress test 12/2014   Osteoarthritis    Renal insufficiency     Past Surgical History:  Procedure Laterality Date   BIOPSY  06/06/2019   Procedure: BIOPSY;  Surgeon: Ladene Artist, MD;  Location: [facility];  Service: Endoscopy;;   cataracts bilaterally  1 -2012   COLONOSCOPY WITH PROPOFOL N/A 06/06/2019   Procedure: COLONOSCOPY WITH PROPOFOL;  Surgeon: Ladene Artist, MD;  Location: [facility];  Service: Endoscopy;  Laterality: N/A;   LUNG BIOPSY  1999   neg   MASTECTOMY Left 1999   and lymphnodes      reports that she has never smoked. She has never used smokeless tobacco. She reports  previous alcohol use. She reports that she does not use drugs.  Allergies  Allergen Reactions   Codeine Nausea And Vomiting   Morphine Nausea And Vomiting   Oxycodone-Acetaminophen Nausea And Vomiting   Sulfonamide Derivatives Nausea And Vomiting   Tramadol Hcl Nausea And Vomiting    Family History  Problem Relation Age of Onset   Breast cancer Other        ? of    Heart attack Father 38   Diabetes Sister        2   Hypertension Sister    Colon cancer Neg Hx     Stomach cancer Neg Hx      Prior to Admission medications   Medication Sig Start Date End Date Taking? Authorizing Provider  acetaminophen (TYLENOL) 325 MG tablet Take 2 tablets (650 mg total) by mouth every 6 (six) hours as needed for mild pain, fever or headache (or Fever >/= 101). 04/12/19   Emokpae, Courage, MD  allopurinol (ZYLOPRIM) 100 MG tablet Take 1 tablet (100 mg total) by mouth daily. 06/29/19   Granville Lewis C, PA-C  amLODipine (NORVASC) 10 MG tablet Take 1 tablet (10 mg total) by mouth daily. 07/24/19   Colon Branch, MD  atorvastatin (LIPITOR) 20 MG tablet Take 1 tablet (20 mg total) by mouth daily. 06/29/19   Wille Celeste, PA-C  Calcium Carb-Cholecalciferol (CALTRATE 600+D3 PO) Take 1 tablet by mouth 2 (two) times daily.     [provider]  Cholecalciferol (VITAMIN D3) 1000 UNITS CAPS Take 1 capsule by mouth daily.     [provider]  dicyclomine (BENTYL) 10 MG capsule Take 2 capsules (20 mg total) by mouth 4 (four) times daily -  before meals and at bedtime. 06/29/19   Granville Lewis C, PA-C  doxycycline (VIBRA-TABS) 100 MG tablet Take 1 tablet (100 mg total) by mouth 2 (two) times daily. Patient not taking: Reported on 08/02/2019 07/26/19   Colon Branch, MD  dronabinol (MARINOL) 2.5 MG capsule Take 1 capsule (2.5 mg total) by mouth 2 (two) times daily before a meal. 07/24/19   Colon Branch, MD  escitalopram (LEXAPRO) 10 MG tablet Take 2 tablets (20 mg total) by mouth daily. 07/06/19   Colon Branch, MD  ferrous sulfate 325 (65 FE) MG tablet Take 1 tablet (325 mg total) by mouth daily with breakfast. 06/29/19 07/29/19  Granville Lewis C, PA-C  loperamide (IMODIUM A-D) 2 MG tablet Take 2 mg by mouth 4 (four) times daily as needed for diarrhea or loose stools.    [provider]  metoprolol tartrate (LOPRESSOR) 25 MG tablet Take 1 tablet (25 mg total) by mouth 2 (two) times daily. 06/29/19   Granville Lewis C, PA-C  mirtazapine (REMERON) 15 MG tablet Take 1 tablet (15 mg  total) by mouth at bedtime. 07/24/19   Colon Branch, MD  Multiple Vitamins-Minerals (CENTRUM SILVER PO) Take 1 tablet by mouth daily.     [provider]  Nutritional Supplements (FEEDING SUPPLEMENT, BOOST BREEZE,) LIQD Take 237 mLs by mouth daily.    [provider]  ondansetron (ZOFRAN) 4 MG tablet Take 1 tablet (4 mg total) by mouth 4 (four) times daily. 07/24/19   Colon Branch, MD  pantoprazole (PROTONIX) 40 MG tablet Take 1 tablet (40 mg total) by mouth daily before breakfast. 07/24/19   Colon Branch, MD  predniSONE (DELTASONE) 10 MG tablet Take 1 tablets a day (66m) for one month; then take 1.5 tablets  a day (42m) for 2 weeks; then take 1 tablet a day (268m until further notice 07/12/19   PeIrene ShipperMD  predniSONE (DELTASONE) 20 MG tablet Prednisone 40 mg daily during the month of October 2.  Prednisone 30 mg daily from November 1 through November 15 3.  Prednisone 20 mg daily from November 16 until further notice 07/23/19   PeIrene ShipperMD  vitamin B-12 (CYANOCOBALAMIN) 1000 MCG tablet Take 1,000 mcg by mouth daily.      [provider]  vitamin C (ASCORBIC ACID) 500 MG tablet Take 500 mg by mouth daily.    [provider]    Physical Exam: Vitals:   08/02/19 2145 08/02/19 2200 08/02/19 2230 08/02/19 2300  BP: (!) 120/99 (!) 138/57 (!) 135/58 (!) 137/59  Pulse: 69 69 74 67  Resp: 16     Temp:      TempSrc:      SpO2: 100% 100% 100% 100%    Constitutional: NAD, calm  Eyes: PERTLA, lids and conjunctivae normal ENMT: Mucous membranes are moist. Posterior pharynx clear of any exudate or lesions.   Neck: normal, supple, no masses, no thyromegaly Respiratory: no wheezing, no crackles. Normal respiratory effort. No accessory muscle use.  Cardiovascular: S1 & S2 heard, regular rate and rhythm. Pitting edema to bilateral LE's. Abdomen: No distension, no tenderness, soft. Bowel sounds normal.  Musculoskeletal: no clubbing / cyanosis. No joint  deformity upper and lower extremities.    Skin: Marked edema involving left arm with erythema about the dorsal wrist and hand. Warm, dry, well-perfused. Neurologic: No facial asymmetry. Sensation intact. Moving all extremities.  Psychiatric: Alert and oriented to person, place, and situation. Very pleasant, cooperative.    Labs on Admission: I have personally reviewed following labs and imaging studies  CBC: Recent Labs  Lab 08/02/19 2149  WBC 5.0  NEUTROABS 4.2  HGB 7.3*  HCT 22.6*  MCV 102.3*  PLT 32865 Basic Metabolic Panel: Recent Labs  Lab 08/02/19 2149  NA 135  K 5.1  CL 101  CO2 15*  GLUCOSE 207*  BUN 108*  CREATININE 6.02*  CALCIUM 6.5*   GFR: Estimated Creatinine Clearance: 6.4 mL/min (A) (by C-G formula based on SCr of 6.02 mg/dL (H)). Liver Function Tests: Recent Labs  Lab 08/02/19 2149  AST 20  ALT 26  ALKPHOS 51  BILITOT 0.4  PROT 6.0*  ALBUMIN 2.6*   No results for input(s): LIPASE, AMYLASE in the last 168 hours. No results for input(s): AMMONIA in the last 168 hours. Coagulation Profile: No results for input(s): INR, PROTIME in the last 168 hours. Cardiac Enzymes: No results for input(s): CKTOTAL, CKMB, CKMBINDEX, TROPONINI in the last 168 hours. BNP (last 3 results) No results for input(s): PROBNP in the last 8760 hours. HbA1C: No results for input(s): HGBA1C in the last 72 hours. CBG: No results for input(s): GLUCAP in the last 168 hours. Lipid Profile: No results for input(s): CHOL, HDL, LDLCALC, TRIG, CHOLHDL, LDLDIRECT in the last 72 hours. Thyroid Function Tests: No results for input(s): TSH, T4TOTAL, FREET4, T3FREE, THYROIDAB in the last 72 hours. Anemia Panel: No results for input(s): VITAMINB12, FOLATE, FERRITIN, TIBC, IRON, RETICCTPCT in the last 72 hours. Urine analysis:    Component Value Date/Time   COLORURINE YELLOW 08/02/2019 2254   APPEARANCEUR CLEAR 08/02/2019 2254   LABSPEC 1.010 08/02/2019 2254   PHURINE 5.0  08/02/2019 2254   GLUCOSEU 50 (A) 08/02/2019 2254   HGG[place]ois MillsEGATIVE 08/02/2019 2254  BILIRUBINUR NEGATIVE 08/02/2019 [place] 08/02/2019 2254   PROTEINUR NEGATIVE 08/02/2019 2254   UROBILINOGEN 0.2 12/29/2014 1400   NITRITE NEGATIVE 08/02/2019 2254   LEUKOCYTESUR NEGATIVE 08/02/2019 2254   Sepsis Labs: @LABRCNTIP (procalcitonin:4,lacticidven:4) )No results found for this or any previous visit (from the past 240 hour(s)).   Radiological Exams on Admission: Dg Forearm Left  Result Date: 08/02/2019 CLINICAL DATA:  Left arm cellulitis. EXAM: LEFT FOREARM - 2 VIEW COMPARISON:  None. FINDINGS: There is no evidence of fracture or other focal bone lesions. No bone erosions identified. There is diffuse soft tissue swelling identified. IMPRESSION: 1. No acute bone abnormality. 2. Diffuse soft tissue swelling. Electronically Signed   By: Kerby Moors M.D.   On: 08/02/2019 21:31   Dg Hand Complete Left  Result Date: 08/02/2019 CLINICAL DATA:  Left arm cellulitis EXAM: LEFT HAND - COMPLETE 3+ VIEW COMPARISON:  None. FINDINGS: There is no evidence of fracture or dislocation. 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 Diffuse dorsal soft tissue edema is noted. No subcutaneous emphysema seen. IMPRESSION: Diffuse dorsal subcutaneous edema surrounding the wrist and hand, likely consistent with cellulitis. Electronically Signed   By: Prudencio Pair M.D.   On: 08/02/2019 21:33    EKG: Independently reviewed. Sinus rhythm, non-specific ST abnormality appears similar to prior.   Assessment/Plan   1. Acute kidney injury superimposed on CKD IV; acidosis; hypervolemia   - BUN is 108 and SCr 6.02 on admission, up from 3.7 earlier this month and 2.5 in September 2020  - Potassium is wnl, bicarb is 15, BP controlled, and she is hypervolemic without respiratory complaints  - Nephrology consulting and much appreciated  - No protein or blood on UA,  renal US and urine chemistries pending  - Renally-dose medications, avoid nephrotoxins, start bicarbonate, repeat chem panel in am  - We briefly discussed the possibility of dialysis if renal fxn fails to improve but she is not sure if she would want this, noting that "I might rather just pass"   2. Left arm cellulitis  - Presents with continued swelling and redness of left arm despite outpatient antibiotics  - She is not febrile and there is no leukocytosis but lactate is elevated and there is clinical worsening documented with pictures in PCP notes  - Plain radiographs negative for acute osseous abnormality  - Blood cultures were collected in ED and she was started on empiric antibiotics - Continue antibiotic with Rocephin, check MRSA pcr, elevate arm, follow cultures and clinical course   3. Diabetes mellitus   - A1c was only 5.2% in August and she is not currently on medications for this but serum glucose 207 in ED, likely from prednisone use  - Monitor CBG's and use a low-intensity SSI    4. Hypertension  - BP at goal, continue metoprolol and Norvasc    5. CAD  - No anginal complaints  - Continue statin and beta-blocker    6. Crohn's disease  - No active abdominal pain, bleeding, or diarrhea - Continue prednisone    7. Anemia  - Hgb is 7.3 on admission, was 7.4 ten days earlier   - She denies bleeding but reports requiring blood transfusions recently  - Check anemia panel    PPE: Mask, face shield  DVT prophylaxis: SCD's  Code Status: DNR  Family Communication: Discussed with patient  Consults called: Nephrology consulted by ED physician  Admission status: inpatient    Ilene Qua  Virgene Tirone, MD [facility] Pager [phone]  If 7PM-7AM, please contact night-coverage [email or web address] Password [email or web address]  08/02/2019, 11:45 PM

## 2019-08-03 ENCOUNTER — Encounter (HOSPITAL_COMMUNITY): Payer: Self-pay | Admitting: Family Medicine

## 2019-08-03 ENCOUNTER — Inpatient Hospital Stay (HOSPITAL_COMMUNITY): Payer: Medicare Other

## 2019-08-03 HISTORY — PX: IR FLUORO GUIDE CV LINE RIGHT: IMG2283

## 2019-08-03 HISTORY — PX: IR US GUIDE VASC ACCESS RIGHT: IMG2390

## 2019-08-03 LAB — CBC WITH DIFFERENTIAL/PLATELET
Abs Immature Granulocytes: 0.02 10*3/uL (ref 0.00–0.07)
Basophils Absolute: 0 10*3/uL (ref 0.0–0.1)
Basophils Relative: 0 %
Eosinophils Absolute: 0 10*3/uL (ref 0.0–0.5)
Eosinophils Relative: 0 %
HCT: 18.5 % — ABNORMAL LOW (ref 36.0–46.0)
Hemoglobin: 6.2 g/dL — CL (ref 12.0–15.0)
Immature Granulocytes: 0 %
Lymphocytes Relative: 15 %
Lymphs Abs: 0.8 10*3/uL (ref 0.7–4.0)
MCH: 32.5 pg (ref 26.0–34.0)
MCHC: 33.5 g/dL (ref 30.0–36.0)
MCV: 96.9 fL (ref 80.0–100.0)
Monocytes Absolute: 0.5 10*3/uL (ref 0.1–1.0)
Monocytes Relative: 9 %
Neutro Abs: 4.4 10*3/uL (ref 1.7–7.7)
Neutrophils Relative %: 76 %
Platelets: 274 10*3/uL (ref 150–400)
RBC: 1.91 MIL/uL — ABNORMAL LOW (ref 3.87–5.11)
RDW: 17.9 % — ABNORMAL HIGH (ref 11.5–15.5)
WBC: 5.7 10*3/uL (ref 4.0–10.5)
nRBC: 0 % (ref 0.0–0.2)

## 2019-08-03 LAB — BASIC METABOLIC PANEL
Anion gap: 17 — ABNORMAL HIGH (ref 5–15)
BUN: 113 mg/dL — ABNORMAL HIGH (ref 8–23)
CO2: 16 mmol/L — ABNORMAL LOW (ref 22–32)
Calcium: 6.1 mg/dL — CL (ref 8.9–10.3)
Chloride: 101 mmol/L (ref 98–111)
Creatinine, Ser: 5.92 mg/dL — ABNORMAL HIGH (ref 0.44–1.00)
GFR calc Af Amer: 7 mL/min — ABNORMAL LOW (ref >60)
GFR calc non Af Amer: 6 mL/min — ABNORMAL LOW (ref >60)
Glucose, Bld: 150 mg/dL — ABNORMAL HIGH (ref 70–99)
Potassium: 5.1 mmol/L (ref 3.5–5.1)
Sodium: 134 mmol/L — ABNORMAL LOW (ref 135–145)

## 2019-08-03 LAB — PREPARE RBC (CROSSMATCH)

## 2019-08-03 LAB — PROTIME-INR
INR: 1 (ref 0.8–1.2)
Prothrombin Time: 13.4 seconds (ref 11.4–15.2)

## 2019-08-03 LAB — GLUCOSE, CAPILLARY
Glucose-Capillary: 85 mg/dL (ref 70–99)
Glucose-Capillary: 95 mg/dL (ref 70–99)
Glucose-Capillary: 144 mg/dL — ABNORMAL HIGH (ref 70–99)

## 2019-08-03 LAB — OSMOLALITY, URINE: Osmolality, Ur: 316 mOsm/kg (ref 300–900)

## 2019-08-03 LAB — LACTIC ACID, PLASMA: Lactic Acid, Venous: 1.2 mmol/L (ref 0.5–1.9)

## 2019-08-03 LAB — HEMOGLOBIN AND HEMATOCRIT, BLOOD
HCT: 19.9 % — ABNORMAL LOW (ref 36.0–46.0)
HCT: 26.4 % — ABNORMAL LOW (ref 36.0–46.0)
Hemoglobin: 6.7 g/dL — CL (ref 12.0–15.0)
Hemoglobin: 9.3 g/dL — ABNORMAL LOW (ref 12.0–15.0)

## 2019-08-03 LAB — MRSA PCR SCREENING: MRSA by PCR: NEGATIVE

## 2019-08-03 LAB — SARS CORONAVIRUS 2 (TAT 6-24 HRS): SARS Coronavirus 2: NEGATIVE

## 2019-08-03 MED ORDER — ONDANSETRON HCL 4 MG PO TABS: 4.0000 mg | ORAL_TABLET | Freq: Four times a day (QID) | ORAL | Status: DC | PRN

## 2019-08-03 MED ORDER — LIDOCAINE HCL 1 % IJ SOLN
INTRAMUSCULAR | Status: AC
  Filled 2019-08-03: qty 20

## 2019-08-03 MED ORDER — PENTAFLUOROPROP-TETRAFLUOROETH EX AERO: 1.0000 "application " | INHALATION_SPRAY | CUTANEOUS | Status: DC | PRN

## 2019-08-03 MED ORDER — HEPARIN SODIUM (PORCINE) 1000 UNIT/ML IJ SOLN
INTRAMUSCULAR | Status: AC
  Filled 2019-08-03: qty 1

## 2019-08-03 MED ORDER — LIDOCAINE HCL (PF) 1 % IJ SOLN
5.0000 mL | INTRAMUSCULAR | Status: DC | PRN
  Filled 2019-08-03: qty 5

## 2019-08-03 MED ORDER — SODIUM CHLORIDE 0.9% FLUSH
3.0000 mL | Freq: Two times a day (BID) | INTRAVENOUS | Status: DC
  Administered 2019-08-03 – 2019-08-09 (×13): 3 mL via INTRAVENOUS

## 2019-08-03 MED ORDER — FENTANYL CITRATE (PF) 100 MCG/2ML IJ SOLN
INTRAMUSCULAR | Status: AC | PRN
  Administered 2019-08-03: 50 ug via INTRAVENOUS

## 2019-08-03 MED ORDER — ALTEPLASE 2 MG IJ SOLR
2.0000 mg | Freq: Once | INTRAMUSCULAR | Status: DC | PRN
  Filled 2019-08-03: qty 2

## 2019-08-03 MED ORDER — LIDOCAINE HCL 1 % IJ SOLN
INTRAMUSCULAR | Status: AC | PRN
  Administered 2019-08-03: 10 mL

## 2019-08-03 MED ORDER — SODIUM BICARBONATE 650 MG PO TABS
650.0000 mg | ORAL_TABLET | Freq: Two times a day (BID) | ORAL | Status: DC
  Administered 2019-08-03 – 2019-08-04 (×4): 650 mg via ORAL
  Filled 2019-08-03 (×5): qty 1

## 2019-08-03 MED ORDER — LIDOCAINE-PRILOCAINE 2.5-2.5 % EX CREA
1.0000 "application " | TOPICAL_CREAM | CUTANEOUS | Status: DC | PRN
  Filled 2019-08-03: qty 5

## 2019-08-03 MED ORDER — CEFAZOLIN SODIUM-DEXTROSE 2-4 GM/100ML-% IV SOLN
2.0000 g | Freq: Once | INTRAVENOUS | Status: AC
  Administered 2019-08-03: 2 g via INTRAVENOUS
  Filled 2019-08-03: qty 100

## 2019-08-03 MED ORDER — SODIUM CHLORIDE 0.9 % IV SOLN: 250.0000 mL | INTRAVENOUS | Status: DC | PRN

## 2019-08-03 MED ORDER — PANTOPRAZOLE SODIUM 40 MG IV SOLR
40.0000 mg | Freq: Two times a day (BID) | INTRAVENOUS | Status: DC
  Administered 2019-08-04: 40 mg via INTRAVENOUS
  Filled 2019-08-03: qty 40

## 2019-08-03 MED ORDER — PREDNISONE 20 MG PO TABS
40.0000 mg | ORAL_TABLET | Freq: Every day | ORAL | Status: DC
  Administered 2019-08-03 – 2019-08-10 (×7): 40 mg via ORAL
  Filled 2019-08-03 (×7): qty 2

## 2019-08-03 MED ORDER — INSULIN ASPART 100 UNIT/ML ~~LOC~~ SOLN
0.0000 [IU] | SUBCUTANEOUS | Status: DC
  Administered 2019-08-03 (×2): 1 [IU] via SUBCUTANEOUS

## 2019-08-03 MED ORDER — SODIUM CHLORIDE 0.9% IV SOLUTION
Freq: Once | INTRAVENOUS | Status: AC
  Administered 2019-08-03: 07:00:00 via INTRAVENOUS

## 2019-08-03 MED ORDER — FENTANYL CITRATE (PF) 100 MCG/2ML IJ SOLN
INTRAMUSCULAR | Status: AC
  Filled 2019-08-03: qty 2

## 2019-08-03 MED ORDER — HEPARIN SODIUM (PORCINE) 1000 UNIT/ML IJ SOLN
INTRAMUSCULAR | Status: AC | PRN
  Administered 2019-08-03: 3.2 mL via INTRAVENOUS

## 2019-08-03 MED ORDER — CHLORHEXIDINE GLUCONATE CLOTH 2 % EX PADS
6.0000 | MEDICATED_PAD | Freq: Every day | CUTANEOUS | Status: DC
  Administered 2019-08-03 – 2019-08-09 (×5): 6 via TOPICAL

## 2019-08-03 MED ORDER — ACETAMINOPHEN 650 MG RE SUPP: 650.0000 mg | Freq: Four times a day (QID) | RECTAL | Status: DC | PRN

## 2019-08-03 MED ORDER — METOPROLOL TARTRATE 25 MG PO TABS
25.0000 mg | ORAL_TABLET | Freq: Two times a day (BID) | ORAL | Status: DC
  Administered 2019-08-04 – 2019-08-05 (×5): 25 mg via ORAL
  Filled 2019-08-03 (×5): qty 1

## 2019-08-03 MED ORDER — CEFAZOLIN SODIUM-DEXTROSE 2-4 GM/100ML-% IV SOLN
INTRAVENOUS | Status: AC
  Filled 2019-08-03: qty 100

## 2019-08-03 MED ORDER — SODIUM CHLORIDE 0.9 % IV SOLN
1.0000 g | INTRAVENOUS | Status: DC
  Administered 2019-08-04 – 2019-08-09 (×7): 1 g via INTRAVENOUS
  Filled 2019-08-03 (×7): qty 10

## 2019-08-03 MED ORDER — SODIUM CHLORIDE 0.9 % IV SOLN: 100.0000 mL | INTRAVENOUS | Status: DC | PRN

## 2019-08-03 MED ORDER — HEPARIN SODIUM (PORCINE) 1000 UNIT/ML DIALYSIS
1000.0000 [IU] | INTRAMUSCULAR | Status: DC | PRN
  Administered 2019-08-04: 01:00:00 3200 [IU] via INTRAVENOUS_CENTRAL
  Filled 2019-08-03 (×2): qty 1

## 2019-08-03 MED ORDER — ONDANSETRON HCL 4 MG/2ML IJ SOLN: 4.0000 mg | Freq: Four times a day (QID) | INTRAMUSCULAR | Status: DC | PRN

## 2019-08-03 MED ORDER — SODIUM CHLORIDE 0.9% FLUSH: 3.0000 mL | INTRAVENOUS | Status: DC | PRN

## 2019-08-03 MED ORDER — ACETAMINOPHEN 325 MG PO TABS: 650.0000 mg | ORAL_TABLET | Freq: Four times a day (QID) | ORAL | Status: DC | PRN

## 2019-08-03 MED ORDER — MIDAZOLAM HCL 2 MG/2ML IJ SOLN
INTRAMUSCULAR | Status: AC | PRN
  Administered 2019-08-03: 1 mg via INTRAVENOUS

## 2019-08-03 MED ORDER — MIDAZOLAM HCL 2 MG/2ML IJ SOLN
INTRAMUSCULAR | Status: AC
  Filled 2019-08-03: qty 2

## 2019-08-03 MED ORDER — ATORVASTATIN CALCIUM 10 MG PO TABS
20.0000 mg | ORAL_TABLET | Freq: Every day | ORAL | Status: DC
  Administered 2019-08-03 – 2019-08-10 (×8): 20 mg via ORAL
  Filled 2019-08-03 (×8): qty 2

## 2019-08-03 MED ORDER — AMLODIPINE BESYLATE 10 MG PO TABS
10.0000 mg | ORAL_TABLET | Freq: Every day | ORAL | Status: DC
  Administered 2019-08-04 – 2019-08-10 (×7): 10 mg via ORAL
  Filled 2019-08-03 (×8): qty 1

## 2019-08-03 MED ORDER — SODIUM CHLORIDE 0.9% FLUSH
3.0000 mL | Freq: Two times a day (BID) | INTRAVENOUS | Status: DC
  Administered 2019-08-03 – 2019-08-08 (×6): 3 mL via INTRAVENOUS

## 2019-08-03 MED ORDER — INSULIN ASPART 100 UNIT/ML ~~LOC~~ SOLN
0.0000 [IU] | Freq: Three times a day (TID) | SUBCUTANEOUS | Status: DC
  Administered 2019-08-04: 1 [IU] via SUBCUTANEOUS
  Administered 2019-08-04 – 2019-08-06 (×3): 2 [IU] via SUBCUTANEOUS
  Administered 2019-08-07: 5 [IU] via SUBCUTANEOUS
  Administered 2019-08-08 – 2019-08-09 (×2): 2 [IU] via SUBCUTANEOUS
  Administered 2019-08-09: 5 [IU] via SUBCUTANEOUS

## 2019-08-03 NOTE — ED Notes (Signed)
ED TO INPATIENT HANDOFF REPORT  ED Nurse Name and Phone #:  Grandville Silos RN [phone]   S Name/Age/Gender Samantha Mckenzie 83 y.o. female Room/Bed: WA15/WA15  Code Status   Code Status: Prior  Home/SNF/Other Home Patient oriented to: self, place, time and situation Is this baseline? Yes   Triage Complete: Triage complete  Chief Complaint swollen lt arm (referred by Dr. Larose Kells)  Triage Note Pt c/o left arm being swollen worse than her normal from her breast cancer back in 1999 over the past couple months. Was taking antibiotics. Also having bilat feet swelling-reports her MD told her that nothing can do about that.    Allergies Allergies  Allergen Reactions  . Codeine Nausea And Vomiting  . Morphine Nausea And Vomiting  . Oxycodone-Acetaminophen Nausea And Vomiting  . Sulfonamide Derivatives Nausea And Vomiting  . Tramadol Hcl Nausea And Vomiting    Level of Care/Admitting Diagnosis ED Disposition    ED Disposition Condition [facility] Area: [place] [100100]  Level of Care: Telemetry Medical [104]  Covid Evaluation: Asymptomatic Screening Protocol (No Symptoms)  Diagnosis: Acute renal failure superimposed on stage 4 chronic kidney disease [facility]) [[id number]]  Admitting Physician: Vianne Bulls [[id number]]  Attending Physician: Vianne Bulls [[id number]]  Estimated length of stay: past midnight tomorrow  Certification:: I certify this patient will need inpatient services for at least 2 midnights  PT Class (Do Not Modify): Inpatient [101]  PT Acc Code (Do Not Modify): Private [1]       B Medical/Surgery History Past Medical History:  Diagnosis Date  . Allergic rhinitis   . Anemia   . Breast CA ([place])    surgery, chemo, XRT; had peripheral neuropathy (imbalance at times) after chemo  . Cellulitis    Left arm, recurrent   . Cellulitis LEFT arm recurrent 12/08/2011  . Chronic renal disease, stage IV ([facility])    Dr. Moshe Cipro  .  Depression   . Fatigue   . GERD (gastroesophageal reflux disease)    gastritis, EGD 02/2007  . Gout   . Hyperlipidemia   . Hypertension   . Macular degeneration 2011  . Normal cardiac stress test 12/2014  . Osteoarthritis   . Renal insufficiency    Past Surgical History:  Procedure Laterality Date  . BIOPSY  06/06/2019   Procedure: BIOPSY;  Surgeon: Ladene Artist, MD;  [facility];  Service: Endoscopy;;  . cataracts bilaterally  1 -2012  . COLONOSCOPY WITH PROPOFOL N/A 06/06/2019   Procedure: COLONOSCOPY WITH PROPOFOL;  Surgeon: Ladene Artist, MD;  [facility];  Service: Endoscopy;  Laterality: N/A;  . LUNG BIOPSY  1999   neg  . MASTECTOMY Left 1999   and lymphnodes      A IV Location/Drains/Wounds Patient Lines/Drains/Airways Status   Active Line/Drains/Airways    Name:   Placement date:   Placement time:   Site:   Days:   Peripheral IV 08/02/19 Anterior;Lateral Antecubital   08/02/19    2152    Antecubital   1   Wound / Incision (Open or Dehisced) 04/07/19 Other (Comment) Head Posterior from previous fall   04/07/19    2010    Head   118          Intake/Output Last 24 hours  Intake/Output Summary (Last 24 hours) at 08/03/2019 0039 Last data filed at 08/02/2019 2344 Gross per 24 hour  Intake 300 ml  Output -  Net 300 ml  Labs/Imaging Results for orders placed or performed during the hospital encounter of 08/02/19 (from the past 48 hour(s))  CBC with Differential/Platelet     Status: Abnormal   Collection Time: 08/02/19  9:49 PM  Result Value Ref Range   WBC 5.0 4.0 - 10.5 K/uL   RBC 2.21 (L) 3.87 - 5.11 MIL/uL   Hemoglobin 7.3 (L) 12.0 - 15.0 g/dL   HCT 22.6 (L) 36.0 - 46.0 %   MCV 102.3 (H) 80.0 - 100.0 fL   MCH 33.0 26.0 - 34.0 pg   MCHC 32.3 30.0 - 36.0 g/dL   RDW 18.1 (H) 11.5 - 15.5 %   Platelets 322 150 - 400 K/uL   nRBC 0.0 0.0 - 0.2 %   Neutrophils Relative % 83 %   Neutro Abs 4.2 1.7 - 7.7 K/uL   Lymphocytes Relative  12 %   Lymphs Abs 0.6 (L) 0.7 - 4.0 K/uL   Monocytes Relative 5 %   Monocytes Absolute 0.3 0.1 - 1.0 K/uL   Eosinophils Relative 0 %   Eosinophils Absolute 0.0 0.0 - 0.5 K/uL   Basophils Relative 0 %   Basophils Absolute 0.0 0.0 - 0.1 K/uL   Immature Granulocytes 0 %   Abs Immature Granulocytes 0.02 0.00 - 0.07 K/uL    Comment: Performed at [facility], [place] [address], [place], [place] [address]  Comprehensive metabolic panel     Status: Abnormal   Collection Time: 08/02/19  9:49 PM  Result Value Ref Range   Sodium 135 135 - 145 mmol/L   Potassium 5.1 3.5 - 5.1 mmol/L   Chloride 101 98 - 111 mmol/L   CO2 15 (L) 22 - 32 mmol/L   Glucose, Bld 207 (H) 70 - 99 mg/dL   BUN 108 (H) 8 - 23 mg/dL    Comment: RESULTS CONFIRMED BY MANUAL DILUTION   Creatinine, Ser 6.02 (H) 0.44 - 1.00 mg/dL   Calcium 6.5 (L) 8.9 - 10.3 mg/dL   Total Protein 6.0 (L) 6.5 - 8.1 g/dL   Albumin 2.6 (L) 3.5 - 5.0 g/dL   AST 20 15 - 41 U/L   ALT 26 0 - 44 U/L   Alkaline Phosphatase 51 38 - 126 U/L   Total Bilirubin 0.4 0.3 - 1.2 mg/dL   GFR calc non Af Amer 6 (L) >60 mL/min   GFR calc Af Amer 7 (L) >60 mL/min   Anion gap 19 (H) 5 - 15    Comment: Performed at [facility], [place] [address] [place], [place] [address]  Brain natriuretic peptide     Status: Abnormal   Collection Time: 08/02/19  9:49 PM  Result Value Ref Range   B Natriuretic Peptide 509.3 (H) 0.0 - 100.0 pg/mL    Comment: Performed at [facility], [place] [address], [place], [place] [address]  Lactic acid, plasma     Status: Abnormal   Collection Time: 08/02/19  9:49 PM  Result Value Ref Range   Lactic Acid, Venous 4.3 (HH) 0.5 - 1.9 mmol/L    Comment: CRITICAL RESULT CALLED TO, READ BACK BY AND VERIFIED WITH: JEENE NASH @ 2230 ON 08/02/2019 C VARNER Performed at [facility], [place] [address] [place], [place] [address]   Lactic acid, plasma     Status: Abnormal    Collection Time: 08/02/19  9:49 PM  Result Value Ref Range   Lactic Acid, Venous 2.3 (HH) 0.5 - 1.9 mmol/L    Comment:  CRITICAL VALUE NOTED.  VALUE IS CONSISTENT WITH PREVIOUSLY REPORTED AND CALLED VALUE. Performed at [facility], [place] [address] [place], [place] [address]   Urinalysis, Routine w reflex microscopic     Status: Abnormal   Collection Time: 08/02/19 10:54 PM  Result Value Ref Range   Color, Urine YELLOW YELLOW   APPearance CLEAR CLEAR   Specific Gravity, Urine 1.010 1.005 - 1.030   pH 5.0 5.0 - 8.0   Glucose, UA 50 (A) NEGATIVE mg/dL   Hgb urine dipstick NEGATIVE NEGATIVE   Bilirubin Urine NEGATIVE NEGATIVE   Ketones, ur NEGATIVE NEGATIVE mg/dL   Protein, ur NEGATIVE NEGATIVE mg/dL   Nitrite NEGATIVE NEGATIVE   Leukocytes,Ua NEGATIVE NEGATIVE    Comment: Performed at [place] [address], [place], [place] [address]  Sodium, urine, random     Status: None   Collection Time: 08/02/19 10:54 PM  Result Value Ref Range   Sodium, Ur 22 mmol/L    Comment: Performed at [facility], [place] [address] [place], [place] [address]  Creatinine, urine, random     Status: None   Collection Time: 08/02/19 10:54 PM  Result Value Ref Range   Creatinine, Urine 57.29 mg/dL    Comment: Performed at [facility], [place] [address], [place], [place] [address]   Dg Forearm Left  Result Date: 08/02/2019 CLINICAL DATA:  Left arm cellulitis. EXAM: LEFT FOREARM - 2 VIEW COMPARISON:  None. FINDINGS: There is no evidence of fracture or other focal bone lesions. No bone erosions identified. There is diffuse soft tissue swelling identified. IMPRESSION: 1. No acute bone abnormality. 2. Diffuse soft tissue swelling. Electronically Signed   By: Kerby Moors M.D.   On: 08/02/2019 21:31   Dg Hand Complete Left  Result Date: 08/02/2019 CLINICAL DATA:  Left arm cellulitis EXAM: LEFT HAND - COMPLETE 3+ VIEW COMPARISON:   None. FINDINGS: There is no evidence of fracture or dislocation. 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 Diffuse dorsal soft tissue edema is noted. No subcutaneous emphysema seen. IMPRESSION: Diffuse dorsal subcutaneous edema surrounding the wrist and hand, likely consistent with cellulitis. Electronically Signed   By: Prudencio Pair M.D.   On: 08/02/2019 21:33    Pending Labs Unresulted Labs (From admission, onward)    Start     Ordered   08/02/19 2255  Osmolality, urine  Once,   STAT     08/02/19 2254   08/02/19 2102  Blood culture (routine x 2)  BLOOD CULTURE X 2,   STAT     08/02/19 2101   Signed and Held  Basic metabolic panel  Tomorrow morning,   R     Signed and Held   Signed and Held  CBC WITH DIFFERENTIAL  Tomorrow morning,   R     Signed and Held   Signed and Held  Lactic acid, plasma  Once-Timed,   R     Signed and Held          Vitals/Pain Today's Vitals   08/02/19 2200 08/02/19 2230 08/02/19 2300 08/02/19 2330  BP: (!) 138/57 (!) 135/58 (!) 137/59 (!) 123/58  Pulse: 69 74 67 66  Resp:    15  Temp:      TempSrc:      SpO2: 100% 100% 100% 99%  PainSc:        Isolation Precautions No active isolations  Medications Medications  cefTRIAXone (ROCEPHIN) 2  g in sodium chloride 0.9 % 100 mL IVPB (0 g Intravenous Stopped 08/02/19 2237)  vancomycin (VANCOCIN) IVPB 1000 mg/200 mL premix (0 mg Intravenous Stopped 08/02/19 2344)    Mobility walks with device Low fall risk   Focused Assessments    R Recommendations: See Admitting Provider Note  Report given to:   Additional Notes:

## 2019-08-03 NOTE — Progress Notes (Signed)
PROGRESS NOTE    Samantha Mckenzie  XAJ:[id number] DOB: 1936-08-28 DOA: 08/02/2019 PCP: Colon Branch, MD   Brief Narrative: 83 year old with past medical history significant for depression, breast cancer status post left mastectomy/chemotherapy radiation, anemia Crohn's ileitis, CAD and CKD stage IV who presents to the emergency department for evaluation of progressive left arm swelling and bilateral lower extremity edema.  Patient reports occasional left arm swelling since her left mastectomy 20 years ago, but this has been much worse in recent weeks, also report redness.  She was treated as an outpatient with 2 courses of antibiotics without significant improvement.  She denies hematochezia melena.  Evaluation in the ED patient was found to be afebrile, blood pressure stable.  Radiograph of the left forearm and left wrist demonstrate soft tissue swelling of the forearm subcutaneous edema no bony abnormality and suggestive of cellulitis.  Patient was found to have acute on chronic worsening renal function creatinine at 6, bicarb at 15, BUN 108.  CBC hemoglobin 7.3.\  Patient has been admitted for further management of acute on chronic renal failure left arm cellulitis that failed oral antibiotics. Patient hemoglobin dropped further to 6.8.  She reports black stool, which she is taking oral iron.  She denies hematochezia.   Assessment & Plan:   Principal Problem:   Acute renal failure superimposed on stage 4 chronic kidney disease (HCC) Active Problems:   Diabetes mellitus type 2 with complications (HCC)   Macrocytic anemia   Essential hypertension   Cellulitis of arm, left   Acidosis   1-acute kidney injuries superimposed on chronic kidney disease a stage IV Metabolic acidosis hyperkalemia: -Patient presented with a BUN 108 and creatinine at 6.  Earlier this month creatinine 3.7 -Nephrology has been consulted and is recommending to start patient on hemodialysis. -Radiology has been  consulted for end of tunnel hemodialysis catheter placement. -Per nephrology patient might require renal biopsy during this hospitalization.  2-left arm cellulitis: Cellulitis superimposed on lymphedema. Continue with IV ceftriaxone. Continue with elevation of the arm. Will check ultrasound rule out DVT.  3-anemia: This could be anemia of chronic kidney disease. No active bleeding.  She report black stool but she is taking oral iron. We will check occult blood but these might be positive with her history of Crohn's ileitis. She will receive 2 units of packed red blood cell. I have started PPI twice daily. Monitor hemoglobin and monitor for melena.  4-diabetes: Continue with a sliding scale insulin.  5-hypertension: Continue with metoprolol and Norvasc 6-CAD: Continue with the statins and beta-blocker. 7-Crohn's disease: Continue with prednisone    Estimated body mass index is 24.87 kg/m as calculated from the following:   Height as of this encounter: 5' 2"  (1.575 m).   Weight as of this encounter: 61.7 kg.   DVT prophylaxis: SCDs due to anemia. Code Status: DNR Family Communication: Care discussed with patient Disposition Plan: Remain in the hospital for further treatment of AKI, anemia.  Left upper extremity cellulitis requiring IV antibiotics.  Blood transfusion. Consultants:   Nephrology  Procedures:   Hemodialysis tunnel catheter  Antimicrobials:  Ceftriaxone 10-23  Subjective: She is alert and oriented x3.  She report mild improvement of left arm swelling and redness.  She agreed to start hemodialysis.  Objective: Vitals:   08/03/19 0201 08/03/19 0400 08/03/19 0551 08/03/19 0856  BP: 135/65 (!) 152/66 (!) 150/55 137/72  Pulse: 72 78 75 78  Resp:    18  Temp: 97.7 F (36.5 C) 98.5  F (36.9 C) 98.1 F (36.7 C) 98.1 F (36.7 C)  TempSrc: Oral Oral Oral Oral  SpO2: 100% 99% 99% 100%  Weight: 61.7 kg     Height: 5' 2"  (1.575 m)       Intake/Output  Summary (Last 24 hours) at 08/03/2019 0934 Last data filed at 08/03/2019 0457 Gross per 24 hour  Intake 360 ml  Output --  Net 360 ml   Filed Weights   08/03/19 0201  Weight: 61.7 kg    Examination:  General exam: Appears calm and comfortable  Respiratory system: Clear to auscultation. Respiratory effort normal. Cardiovascular system: S1 & S2 heard, RRR. No JVD, murmurs, rubs, gallops or clicks. No pedal edema. Gastrointestinal system: Abdomen is nondistended, soft and nontender. No organomegaly or masses felt. Normal bowel sounds heard. Central nervous system: Alert and oriented. No focal neurological deficits. Extremities: Symmetric 5 x 5 power. Skin; redness and swelling in left arm   Data Reviewed: I have personally reviewed following labs and imaging studies  CBC: Recent Labs  Lab 08/02/19 2149 08/03/19 0328 08/03/19 0504  WBC 5.0 5.7  --   NEUTROABS 4.2 4.4  --   HGB 7.3* 6.2* 6.7*  HCT 22.6* 18.5* 19.9*  MCV 102.3* 96.9  --   PLT 322 274  --    Basic Metabolic Panel: Recent Labs  Lab 08/02/19 2149 08/03/19 0328  NA 135 134*  K 5.1 5.1  CL 101 101  CO2 15* 16*  GLUCOSE 207* 150*  BUN 108* 113*  CREATININE 6.02* 5.92*  CALCIUM 6.5* 6.1*   GFR: Estimated Creatinine Clearance: 6.5 mL/min (A) (by C-G formula based on SCr of 5.92 mg/dL (H)). Liver Function Tests: Recent Labs  Lab 08/02/19 2149  AST 20  ALT 26  ALKPHOS 51  BILITOT 0.4  PROT 6.0*  ALBUMIN 2.6*   No results for input(s): LIPASE, AMYLASE in the last 168 hours. No results for input(s): AMMONIA in the last 168 hours. Coagulation Profile: No results for input(s): INR, PROTIME in the last 168 hours. Cardiac Enzymes: No results for input(s): CKTOTAL, CKMB, CKMBINDEX, TROPONINI in the last 168 hours. BNP (last 3 results) No results for input(s): PROBNP in the last 8760 hours. HbA1C: No results for input(s): HGBA1C in the last 72 hours. CBG: Recent Labs  Lab 08/03/19 0805  GLUCAP  85   Lipid Profile: No results for input(s): CHOL, HDL, LDLCALC, TRIG, CHOLHDL, LDLDIRECT in the last 72 hours. Thyroid Function Tests: No results for input(s): TSH, T4TOTAL, FREET4, T3FREE, THYROIDAB in the last 72 hours. Anemia Panel: No results for input(s): VITAMINB12, FOLATE, FERRITIN, TIBC, IRON, RETICCTPCT in the last 72 hours. Sepsis Labs: Recent Labs  Lab 08/02/19 2149 08/03/19 0328  LATICACIDVEN 2.3*   4.3* 1.2    Recent Results (from the past 240 hour(s))  SARS CORONAVIRUS 2 (TAT 6-24 HRS) Nasopharyngeal Nasal Mucosa     Status: None   Collection Time: 08/03/19  2:40 AM   Specimen: Nasal Mucosa; Nasopharyngeal  Result Value Ref Range Status   SARS Coronavirus 2 NEGATIVE NEGATIVE Final    Comment: (NOTE) SARS-CoV-2 target nucleic acids are NOT DETECTED. The SARS-CoV-2 RNA is generally detectable in upper and lower respiratory specimens during the acute phase of infection. Negative results do not preclude SARS-CoV-2 infection, do not rule out co-infections with other pathogens, and should not be used as the sole basis for treatment or other patient management decisions. Negative results must be combined with clinical observations, patient history, and epidemiological information.  The expected result is Negative. Fact Sheet for Patients: SugarRoll.be Fact Sheet for Healthcare Providers: https://www.woods-mathews.com/ This test is not yet approved or cleared by the [place] FDA and  has been authorized for detection and/or diagnosis of SARS-CoV-2 by FDA under an Emergency Use Authorization (EUA). This EUA will remain  in effect (meaning this test can be used) for the duration of the COVID-19 declaration under Section 56 4(b)(1) of the Act, 21 U.S.C. section 360bbb-3(b)(1), unless the authorization is terminated or revoked sooner. Performed at [facility], [place] [address] [place], [place] [address]   MRSA PCR  Screening     Status: None   Collection Time: 08/03/19  2:40 AM  Result Value Ref Range Status   MRSA by PCR NEGATIVE NEGATIVE Final    Comment:        The GeneXpert MRSA Assay (FDA approved for NASAL specimens only), is one component of a comprehensive MRSA colonization surveillance program. It is not intended to diagnose MRSA infection nor to guide or monitor treatment for MRSA infections. Performed at [facility], [place] [address] [place],  [address]          Radiology Studies: Dg Forearm Left  Result Date: 08/02/2019 CLINICAL DATA:  Left arm cellulitis. EXAM: LEFT FOREARM - 2 VIEW COMPARISON:  None. FINDINGS: There is no evidence of fracture or other focal bone lesions. No bone erosions identified. There is diffuse soft tissue swelling identified. IMPRESSION: 1. No acute bone abnormality. 2. Diffuse soft tissue swelling. Electronically Signed   By: Kerby Moors M.D.   On: 08/02/2019 21:31   US Renal  Result Date: 08/03/2019 CLINICAL DATA:  Initial evaluation for acute renal failure. EXAM: RENAL / URINARY TRACT ULTRASOUND COMPLETE COMPARISON:  Prior CT from 05/30/2019. FINDINGS: Right Kidney: Renal measurements: 10.7 x 5.8 x 5.6 cm = volume: 180 mL. Diffusely increased echogenicity within the renal parenchyma, compatible with medical renal disease. No nephrolithiasis or hydronephrosis. 2.2 x 1.7 x 1.9 cm simple cyst present at the upper pole. Left Kidney: Renal measurements: 8.3 x 4.8 x 4.1 cm = volume: 85.2 mL. Diffusely increased echogenicity within the renal parenchyma, compatible with medical renal disease. No nephrolithiasis or hydronephrosis. No focal renal mass. Bladder: Appears normal for degree of bladder distention. Other: None. IMPRESSION: 1. Diffusely increased echogenicity within the renal parenchyma, compatible with medical renal disease. No hydronephrosis. 2. 2.2 cm simple right renal cyst. Electronically Signed   By: Jeannine Boga M.D.   On:  08/03/2019 01:08   Dg Hand Complete Left  Result Date: 08/02/2019 CLINICAL DATA:  Left arm cellulitis EXAM: LEFT HAND - COMPLETE 3+ VIEW COMPARISON:  None. FINDINGS: There is no evidence of fracture or dislocation. 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 Diffuse dorsal soft tissue edema is noted. No subcutaneous emphysema seen. IMPRESSION: Diffuse dorsal subcutaneous edema surrounding the wrist and hand, likely consistent with cellulitis. Electronically Signed   By: Prudencio Pair M.D.   On: 08/02/2019 21:33        Scheduled Meds:  amLODipine  10 mg Oral Daily   atorvastatin  20 mg Oral Daily   Chlorhexidine Gluconate Cloth  6 each Topical Q0600   insulin aspart  0-9 Units Subcutaneous Q4H   metoprolol tartrate  25 mg Oral BID   pantoprazole (PROTONIX) IV  40 mg Intravenous Q12H   predniSONE  40 mg Oral Q breakfast   sodium bicarbonate  650 mg Oral BID  sodium chloride flush  3 mL Intravenous Q12H   sodium chloride flush  3 mL Intravenous Q12H   Continuous Infusions:  sodium chloride     cefTRIAXone (ROCEPHIN)  IV       LOS: 1 day    Time spent: 35 minutes     Elmarie Shiley, MD [facility] Pager [phone]  If 7PM-7AM, please contact night-coverage [email or web address] Password [facility] 08/03/2019, 9:34 AM

## 2019-08-03 NOTE — Sedation Documentation (Signed)
Notified Dr Pascal Lux, no coags avail.  Per Dr. Pascal Lux, [place] to cont with procedure w/o coags.  Received verbal order for antibiotics for procedure, from Dr. Pascal Lux

## 2019-08-03 NOTE — TOC Progression Note (Signed)
Transition of Care [facility]) - Progression Note    Patient Details  Name: Samantha Mckenzie MRN: [id number] Date of Birth: 11/29/1935  Transition of Care [facility]) CM/SW [place], LCSW Phone Number: 08/03/2019, 3:56 PM  Clinical Narrative:    CSW met with patient and son [name] at the bedside. Patient is currently followed by [facility] for palliative services.  Patient and son both appear to be pleased with these services and would like them continued on discharge. [name] states that a referral was made to a home health company but he is not sure if they actually saw the patient. CM consult in the event the patient is agreeable to this plan at discharge. Patient will be a new iHD start and will need outpatient iHD set up at discharge. Family is in agreement to provide assistance and transportation as needed. No identified needs at this time. However, CSW will continue to monitor and follow for dispo planning.    Expected Discharge Plan: Home/Self Care Barriers to Discharge: Continued Medical Work up  Expected Discharge Plan and Services Expected Discharge Plan: Home/Self Care   Discharge Planning Services: CM Consult   Living arrangements for the past 2 months: Single Family Home         Social Determinants of Health (SDOH) Interventions    Readmission Risk Interventions Readmission Risk Prevention Plan 08/03/2019 08/03/2019 06/04/2019  Transportation Screening Complete - Complete  PCP or Specialist Appt within 3-5 Days - - -  [place]RI or [place] for [place] - - -  Medication Review ([place]) Referral to Pharmacy - Complete  PCP or Specialist appointment within 3-5 days of discharge Complete - Complete  HRI or Home Care Consult Complete - -  SW Recovery Care/Counseling Consult Complete Complete Complete  Palliative Care Screening Complete Complete Not [place] Not Applicable - -  Some recent data might be hidden

## 2019-08-03 NOTE — Consult Note (Signed)
Reason for Consult: Acute kidney injury on chronic kidney disease stage IV Referring Physician: Shirlyn Goltz, MD (EDP)  HPI:  83 year old Hispanic woman with past medical history significant for longstanding hypertension, coronary artery disease, gout, dyslipidemia, history of breast cancer status post mastectomy, radiation/chemotherapy (20 years ago) with lymphedema of left upper limb and chronic kidney disease stage IV with baseline creatinine that appears to have been around 1.9-2.1.  She recently was seen by Dr. Moshe Cipro [facility]) for a progressive rise of her creatinine following hospitalization back in August when she was diagnosed with terminal ileitis consistent with inflammatory bowel disease/Crohns for which she was on prednisone.    She presented to the emergency room yesterday with progressively worsening left arm swelling and swelling of both of her legs.  Because of features consistent with clinical cellulitis, she had been treated as an outpatient with cefadroxil later switched to doxycycline after poor response.  She reports intermittent shortness of breath without any chest pain along with lassitude and generalized weakness.  She reports nausea with decreased appetite and some dysgeusia.  She denies any myoclonic jerking.  She denies any hematuria or [employer] colored urine and has not noticed any changes in her urinary pattern.  She reports unchanged fluid intake.  She denies any nonsteroidal anti-inflammatory drug use.  Her creatinine in August/September appears to have ranged 2.2-2.6, on 07/20/2019 creatinine was 3.7, on 10/12 creatinine was 4.1 and when seen yesterday creatinine was 6.0 with potassium of 5.1 and a bicarbonate of 15/BUN 108.  Renal ultrasound is negative for any obstruction and fractional excretion of sodium is 1.2%.  Past Medical History:  Diagnosis Date  . Allergic rhinitis   . Anemia   . Breast CA ([place])    surgery, chemo, XRT; had peripheral  neuropathy (imbalance at times) after chemo  . Cellulitis    Left arm, recurrent   . Cellulitis LEFT arm recurrent 12/08/2011  . Chronic renal disease, stage IV (HCC)    Dr. Moshe Cipro  . Depression   . Fatigue   . GERD (gastroesophageal reflux disease)    gastritis, EGD 02/2007  . Gout   . Hyperlipidemia   . Hypertension   . Macular degeneration 2011  . Normal cardiac stress test 12/2014  . Osteoarthritis   . Renal insufficiency     Past Surgical History:  Procedure Laterality Date  . BIOPSY  06/06/2019   Procedure: BIOPSY;  Surgeon: Ladene Artist, MD;  Location: [facility] ENDOSCOPY;  Service: Endoscopy;;  . cataracts bilaterally  1 -2012  . COLONOSCOPY WITH PROPOFOL N/A 06/06/2019   Procedure: COLONOSCOPY WITH PROPOFOL;  Surgeon: Ladene Artist, MD;  Location: [facility] ENDOSCOPY;  Service: Endoscopy;  Laterality: N/A;  . LUNG BIOPSY  1999   neg  . MASTECTOMY Left 1999   and lymphnodes     Family History  Problem Relation Age of Onset  . Breast cancer Other        ? of   . Heart attack Father 31  . Diabetes Sister        2  . Hypertension Sister   . Colon cancer Neg Hx   . Stomach cancer Neg Hx     Social History:  reports that she has never smoked. She has never used smokeless tobacco. She reports previous alcohol use. She reports that she does not use drugs.  Allergies:  Allergies  Allergen Reactions  . Codeine Nausea And Vomiting  . Morphine Nausea And Vomiting  . Oxycodone-Acetaminophen Nausea And  Vomiting  . Sulfonamide Derivatives Nausea And Vomiting  . Tramadol Hcl Nausea And Vomiting    Medications:  Scheduled: . amLODipine  10 mg Oral Daily  . atorvastatin  20 mg Oral Daily  . insulin aspart  0-9 Units Subcutaneous Q4H  . metoprolol tartrate  25 mg Oral BID  . predniSONE  40 mg Oral Q breakfast  . sodium bicarbonate  650 mg Oral BID  . sodium chloride flush  3 mL Intravenous Q12H  . sodium chloride flush  3 mL Intravenous Q12H   BMP Latest Ref Rng &  Units 08/03/2019 08/02/2019 07/23/2019  Glucose 70 - 99 mg/dL 150(H) 207(H) 100(H)  BUN 8 - 23 mg/dL 113(H) 108(H) 79(H)  Creatinine 0.44 - 1.00 mg/dL 5.92(H) 6.02(H) 4.13(H)  Sodium 135 - 145 mmol/L 134(L) 135 140  Potassium 3.5 - 5.1 mmol/L 5.1 5.1 4.4  Chloride 98 - 111 mmol/L 101 101 103  CO2 22 - 32 mmol/L 16(L) 15(L) 23  Calcium 8.9 - 10.3 mg/dL 6.1(LL) 6.5(L) 6.4(L)   CBC Latest Ref Rng & Units 08/03/2019 08/03/2019 08/02/2019  WBC 4.0 - 10.5 K/uL - 5.7 5.0  Hemoglobin 12.0 - 15.0 g/dL 6.7(LL) 6.2(LL) 7.3(L)  Hematocrit 36.0 - 46.0 % 19.9(L) 18.5(L) 22.6(L)  Platelets 150 - 400 K/uL - 274 322    Dg Forearm Left  Result Date: 08/02/2019 CLINICAL DATA:  Left arm cellulitis. EXAM: LEFT FOREARM - 2 VIEW COMPARISON:  None. FINDINGS: There is no evidence of fracture or other focal bone lesions. No bone erosions identified. There is diffuse soft tissue swelling identified. IMPRESSION: 1. No acute bone abnormality. 2. Diffuse soft tissue swelling. Electronically Signed   By: Kerby Moors M.D.   On: 08/02/2019 21:31   US Renal  Result Date: 08/03/2019 CLINICAL DATA:  Initial evaluation for acute renal failure. EXAM: RENAL / URINARY TRACT ULTRASOUND COMPLETE COMPARISON:  Prior CT from 05/30/2019. FINDINGS: Right Kidney: Renal measurements: 10.7 x 5.8 x 5.6 cm = volume: 180 mL. Diffusely increased echogenicity within the renal parenchyma, compatible with medical renal disease. No nephrolithiasis or hydronephrosis. 2.2 x 1.7 x 1.9 cm simple cyst present at the upper pole. Left Kidney: Renal measurements: 8.3 x 4.8 x 4.1 cm = volume: 85.2 mL. Diffusely increased echogenicity within the renal parenchyma, compatible with medical renal disease. No nephrolithiasis or hydronephrosis. No focal renal mass. Bladder: Appears normal for degree of bladder distention. Other: None. IMPRESSION: 1. Diffusely increased echogenicity within the renal parenchyma, compatible with medical renal disease. No  hydronephrosis. 2. 2.2 cm simple right renal cyst. Electronically Signed   By: Jeannine Boga M.D.   On: 08/03/2019 01:08   Dg Hand Complete Left  Result Date: 08/02/2019 CLINICAL DATA:  Left arm cellulitis EXAM: LEFT HAND - COMPLETE 3+ VIEW COMPARISON:  None. FINDINGS: There is no evidence of fracture or dislocation. 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 Diffuse dorsal soft tissue edema is noted. No subcutaneous emphysema seen. IMPRESSION: Diffuse dorsal subcutaneous edema surrounding the wrist and hand, likely consistent with cellulitis. Electronically Signed   By: Prudencio Pair M.D.   On: 08/02/2019 21:33    Review of Systems  Constitutional: Positive for malaise/fatigue and weight loss. Negative for chills and fever.  HENT: Negative.   Eyes: Negative.   Respiratory: Negative.   Cardiovascular: Positive for leg swelling. Negative for chest pain, palpitations and orthopnea.  Gastrointestinal: Positive for nausea. Negative for abdominal pain, diarrhea and vomiting.  With dysgeusia  Genitourinary: Negative.   Musculoskeletal: Negative.   Skin: Positive for rash.       Redness of left forearm skin  Neurological: Positive for weakness. Negative for dizziness, tremors, focal weakness and seizures.  Psychiatric/Behavioral: The patient is nervous/anxious.    Blood pressure (!) 150/55, pulse 75, temperature 98.1 F (36.7 C), temperature source Oral, resp. rate 17, height 5' 2"  (1.575 m), weight 61.7 kg, SpO2 99 %. Physical Exam  Nursing note and vitals reviewed. Constitutional: She is oriented to person, place, and time. She appears well-developed and well-nourished. No distress.  HENT:  Head: Normocephalic and atraumatic.  Mouth/Throat: Oropharynx is clear and moist.  Eyes: Pupils are equal, round, and reactive to light. Conjunctivae and EOM are normal. No scleral icterus.  Neck: Normal range of motion. Neck supple. No JVD  present.  Cardiovascular: Normal rate, regular rhythm and normal heart sounds.  No murmur heard. Respiratory: Effort normal and breath sounds normal. She has no wheezes. She has no rales.  GI: Soft. Bowel sounds are normal. There is no abdominal tenderness. There is no rebound.  Musculoskeletal:        General: Edema present.     Comments: 2+ bilateral lower extremity edema.  Asymmetric left upper extremity swelling with forearm erythema consistent with cellulitis.  Neurological: She is alert and oriented to person, place, and time.  Skin: Skin is warm and dry. There is erythema.  Erythema noted over left forearm with significant arm swelling.    Assessment/Plan: 1.  Acute kidney injury on chronic kidney disease stage IV: Appears oliguric at this time without any acute electrolyte abnormality-mild metabolic acidosis and hyperkalemia noted.  Possible differential diagnosis include acute interstitial nephritis, ATN and very less likely infection associated GN.  Urine microscopy is pending.  At this time, she does have early uremic symptoms and the plan will be to initiate her on hemodialysis (she agrees to this after extensive discussion of the risks and benefits).  I will consult interventional radiology for placement of a tunneled hemodialysis catheter anticipating intermediate to long-term dialysis needs.  She will need renal biopsy next week for diagnosis/prognosis of her acute kidney injury.  She is currently on prednisone for her inflammatory bowel disease/Crohn's. 2.  Volume overload: Begin hemodialysis for volume unloading.  Obtain 2D echocardiogram. 3.  Anion gap metabolic acidosis: Secondary to acute kidney injury and mild lactic acidosis, undertake hemodialysis (currently on oral sodium bicarbonate supplementation which will be discontinued upon beginning hemodialysis). 4.  Cellulitis of left upper extremity: Superimposed on chronic lymphedema following ipsilateral mastectomy/lymph node  dissection.  On ceftriaxone and status post vancomycin-monitor cautiously for vancomycin toxicity. 5.  Anemia: Likely anemia of chronic disease with dilutional effect from current volume status.  Iron studies checked. 6.  Hypertension: Monitor on current antihypertensive therapy/ultrafiltration with HD.  Amiayah Giebel K. 08/03/2019, 6:51 AM

## 2019-08-03 NOTE — TOC Initial Note (Signed)
Transition of Care [facility]) - Initial/Assessment Note    Patient Details  Name: Samantha Mckenzie MRN: [id number] Date of Birth: 1936-06-13  Transition of Care [facility]) CM/SW Contact:    Atilano Median, LCSW Phone Number: 08/03/2019, 9:02 AM  Clinical Narrative:                 Patient admitted for left arm swelling, but found to have an AKI on admission. Will need iHD access placed for new iHD start. CSW will follow for dispo planning and any dispo needs.   Expected Discharge Plan: Home/Self Care Barriers to Discharge: Continued Medical Work up   Patient Goals and CMS Choice        Expected Discharge Plan and Services Expected Discharge Plan: Home/Self Care       Living arrangements for the past 2 months: Single Family Home                         Prior Living Arrangements/Services Living arrangements for the past 2 months: Single Family Home Lives with:: Spouse          Need for Family Participation in Patient Care: Yes (Comment) Care giver support system in place?: Yes (comment)      Activities of Daily Living Home Assistive Devices/Equipment: Walker (specify type) ADL Screening (condition at time of admission) Patient's cognitive ability adequate to safely complete daily activities?: No Is the patient deaf or have difficulty hearing?: No Does the patient have difficulty seeing, even when wearing glasses/contacts?: No Does the patient have difficulty concentrating, remembering, or making decisions?: No Patient able to express need for assistance with ADLs?: Yes Does the patient have difficulty dressing or bathing?: No Independently performs ADLs?: No Communication: Independent Dressing (OT): Independent Grooming: Independent Feeding: Independent Bathing: Independent Toileting: Needs assistance Is this a change from baseline?: Pre-admission baseline In/Out Bed: Needs assistance Is this a change from baseline?: Pre-admission baseline Walks in Home: Needs  assistance Is this a change from baseline?: Change from baseline, expected to last <3 days Does the patient have difficulty walking or climbing stairs?: Yes Weakness of Legs: Both Weakness of Arms/Hands: Both  Permission Sought/Granted                  Emotional Assessment       Orientation: : Oriented to Self, Oriented to Place, Oriented to  Time, Oriented to Situation      Admission diagnosis:  AKI (acute kidney injury) ([place]) [N17.9] Cellulitis of arm, left [L03.114] Patient Active Problem List   Diagnosis Date Noted  . Cellulitis of arm, left 08/02/2019  . Acidosis   . Dementia ([place]) 06/17/2019  . Abnormal CT scan, small bowel   . Dehydration 05/31/2019  . Acute blood loss anemia   . Ileitis 05/30/2019  . Terminal ileitis with complication ([place]) 40/98/1191  . COPD (chronic obstructive pulmonary disease) ([place]) 04/12/2019  . Enteritis 04/07/2019  . Chronic diastolic CHF (congestive heart failure) ([place]) 04/07/2019  . Hypokalemia 04/07/2019  . Hypocalcemia 04/07/2019  . Fall 04/07/2019  . Occult blood positive stool 04/07/2019  . Adjustment disorder with depressed mood   . Generalized abdominal pain   . Dysphasia 03/21/2019  . CAP (community acquired pneumonia) 03/20/2019  . Nausea vomiting and diarrhea 03/20/2019  . Acute respiratory failure with hypoxia ([place]) 03/20/2019  . Acute renal failure superimposed on stage 4 chronic kidney disease ([place]) 03/20/2019  . Lactic acidosis 03/20/2019  . Diabetic peripheral neuropathy associated with type  2 diabetes mellitus ([place]) 03/20/2019  . Hypomagnesemia 06/21/2018  . Generalized weakness 06/20/2018  . PCP NOTES >>> 07/11/2015  . Sepsis ([place]) 04/19/2013  . Depression   . VITAMIN B12 DEFICIENCY 04/01/2008  . GERD 12/19/2007  . NEOP, MALIGNANT, FEMALE BREAST NOS 07/07/2007  . Osteoarthritis 07/07/2007  . Diabetes mellitus type 2 with complications ([place]) 02/63/7858  . Hyperlipidemia 02/28/2007  . GOUT 02/28/2007  .  Macrocytic anemia 02/28/2007  . Essential hypertension 02/28/2007   PCP:  Colon Branch, MD Pharmacy:   [facility] [place] [address] [place] [address] Phone: [phone] Fax: [place] [address] [place]. [place] [address] Phone: [phone] Fax: [phone]     Social Determinants of Health (SDOH) Interventions    Readmission Risk Interventions Readmission Risk Prevention Plan 06/04/2019 05/31/2019 04/10/2019  Transportation Screening Complete Complete Complete  PCP or Specialist Appt within 3-5 Days - - -  [facility] or [place] for [place] - - -  Medication Review ([place]) Complete Referral to Pharmacy Referral to Pharmacy  PCP or Specialist appointment within 3-5 days of discharge Complete Complete Complete  [facility] or [place] - Complete Complete  SW Recovery Care/Counseling Consult Complete Complete Complete  Palliative Care Screening Not Applicable Not Applicable Not Applicable  Skilled Nursing Facility - Not Applicable Not Applicable  Some recent data might be hidden

## 2019-08-03 NOTE — ED Notes (Signed)
[facility] notified for tranport to [facility].

## 2019-08-03 NOTE — Procedures (Signed)
Pre-procedure Diagnosis: ESRD Post-procedure Diagnosis: Same  Successful placement of tunneled HD catheter with tips terminating within the superior aspect of the right atrium.    Complications: None Immediate  EBL: Minimal   The catheter is ready for immediate use.   Jay Davielle Lingelbach, MD Pager #: [phone]   

## 2019-08-03 NOTE — Consult Note (Signed)
Chief Complaint: Patient was seen in consultation today for AKI/tunneled HD catheter placement.  Referring Physician(s): Elmarie Shiley  Supervising Physician: Sandi Mariscal  Patient Status: [facility] - In-pt  History of Present Illness: Samantha Mckenzie is a 83 y.o. female with a past medical history of hypertension, hyperlipidemia, GERD, CKD stage IV, breast cancer, macular degeneration, OA, and depression. She has had LUE swelling x 2 weeks. She saw her PCP for this who ordered antibiotics for management (clinically thought to be cellulitis). She finished her antibiotic course without improvement of symptoms, and was switched to another antibiotic by her PCP. On 08/02/2019, she saw her PCP again after lack of relief of symptoms, who sent her to ED for admission and IV antibiotics- she went to [facility] ED for further evaluation (also complaining of bilateral lower extremity swelling in ED). In ED, creatinine found to be elevated to 6.02 (up from 3.7 earlier this month and 2.5 last month) and BNP elevated to 509. She was admitted for further management. Nephrology was consulted who recommended initiation of dialysis and IR consultation for possible tunneled HD catheter placement.  IR consulted by Dr. Posey Pronto for possible image-guided tunneled HD catheter placement. Patient laying in bed resting comfortably, she responds to voice and answers questions appropriately, no complaints. Denies fever, chills, chest pain, dyspnea, abdominal pain, or headache.   Past Medical History:  Diagnosis Date  . Allergic rhinitis   . Anemia   . Breast CA ([place])    surgery, chemo, XRT; had peripheral neuropathy (imbalance at times) after chemo  . Cellulitis    Left arm, recurrent   . Cellulitis LEFT arm recurrent 12/08/2011  . Chronic renal disease, stage IV ([facility])    Dr. Moshe Cipro  . Depression   . Fatigue   . GERD (gastroesophageal reflux disease)    gastritis, EGD 02/2007  . Gout   . Hyperlipidemia   .  Hypertension   . Macular degeneration 2011  . Normal cardiac stress test 12/2014  . Osteoarthritis   . Renal insufficiency     Past Surgical History:  Procedure Laterality Date  . BIOPSY  06/06/2019   Procedure: BIOPSY;  Surgeon: Ladene Artist, MD;  Location: [facility];  Service: Endoscopy;;  . cataracts bilaterally  1 -2012  . COLONOSCOPY WITH PROPOFOL N/A 06/06/2019   Procedure: COLONOSCOPY WITH PROPOFOL;  Surgeon: Ladene Artist, MD;  Location: [facility];  Service: Endoscopy;  Laterality: N/A;  . LUNG BIOPSY  1999   neg  . MASTECTOMY Left 1999   and lymphnodes     Allergies: Codeine, Morphine, Oxycodone-acetaminophen, Sulfonamide derivatives, and Tramadol hcl  Medications: Prior to Admission medications   Medication Sig Start Date End Date Taking? Authorizing Provider  acetaminophen (TYLENOL) 325 MG tablet Take 2 tablets (650 mg total) by mouth every 6 (six) hours as needed for mild pain, fever or headache (or Fever >/= 101). 04/12/19  Yes Emokpae, Courage, MD  allopurinol (ZYLOPRIM) 100 MG tablet Take 1 tablet (100 mg total) by mouth daily. 06/29/19  Yes Lassen, Arlo C, PA-C  amLODipine (NORVASC) 10 MG tablet Take 1 tablet (10 mg total) by mouth daily. 07/24/19  Yes Paz, Alda Berthold, MD  atorvastatin (LIPITOR) 20 MG tablet Take 1 tablet (20 mg total) by mouth daily. 06/29/19  Yes Lassen, Arlo C, PA-C  Calcium Carb-Cholecalciferol (CALTRATE 600+D3 PO) Take 1 tablet by mouth 2 (two) times daily.    Yes [provider]  Cholecalciferol (VITAMIN D3) 1000 UNITS CAPS Take 1 capsule  by mouth daily.    Yes [provider]  dicyclomine (BENTYL) 10 MG capsule Take 2 capsules (20 mg total) by mouth 4 (four) times daily -  before meals and at bedtime. 06/29/19  Yes Lassen, Arlo C, PA-C  doxycycline (VIBRA-TABS) 100 MG tablet Take 1 tablet (100 mg total) by mouth 2 (two) times daily. 07/26/19  Yes Paz, Alda Berthold, MD  dronabinol (MARINOL) 2.5 MG capsule Take 1 capsule (2.5 mg  total) by mouth 2 (two) times daily before a meal. 07/24/19  Yes Paz, Alda Berthold, MD  escitalopram (LEXAPRO) 10 MG tablet Take 2 tablets (20 mg total) by mouth daily. 07/06/19  Yes Colon Branch, MD  ferrous sulfate 325 (65 FE) MG tablet Take 1 tablet (325 mg total) by mouth daily with breakfast. 06/29/19 08/03/19 Yes Lassen, Arlo C, PA-C  loperamide (IMODIUM A-D) 2 MG tablet Take 2 mg by mouth 4 (four) times daily as needed for diarrhea or loose stools.   Yes [provider]  metoprolol tartrate (LOPRESSOR) 25 MG tablet Take 1 tablet (25 mg total) by mouth 2 (two) times daily. 06/29/19  Yes Lassen, Arlo C, PA-C  mirtazapine (REMERON) 15 MG tablet Take 1 tablet (15 mg total) by mouth at bedtime. 07/24/19  Yes Paz, Alda Berthold, MD  Multiple Vitamins-Minerals (CENTRUM SILVER PO) Take 1 tablet by mouth daily.    Yes [provider]  ondansetron (ZOFRAN) 4 MG tablet Take 1 tablet (4 mg total) by mouth 4 (four) times daily. 07/24/19  Yes Paz, Alda Berthold, MD  pantoprazole (PROTONIX) 40 MG tablet Take 1 tablet (40 mg total) by mouth daily before breakfast. 07/24/19  Yes Paz, Alda Berthold, MD  predniSONE (DELTASONE) 10 MG tablet Take 1 tablets a day (35m) for one month; then take 1.5 tablets a day (352m for 2 weeks; then take 1 tablet a day (2081muntil further notice 07/12/19  Yes PerIrene ShipperD  vitamin B-12 (CYANOCOBALAMIN) 1000 MCG tablet Take 1,000 mcg by mouth daily.     Yes [provider]  vitamin C (ASCORBIC ACID) 500 MG tablet Take 500 mg by mouth daily.   Yes [provider]  predniSONE (DELTASONE) 20 MG tablet Prednisone 40 mg daily during the month of October 2.  Prednisone 30 mg daily from November 1 through November 15 3.  Prednisone 20 mg daily from November 16 until further notice 07/23/19   PerIrene ShipperD     Family History  Problem Relation Age of Onset  . Breast cancer Other        ? of   . Heart attack Father 86 66 Diabetes Sister        2  . Hypertension  Sister   . Colon cancer Neg Hx   . Stomach cancer Neg Hx     Social History   Socioeconomic History  . Marital status: Married    Spouse name: Not on file  . Number of children: 2  . Years of education: Not on file  . Highest education level: Not on file  Occupational History  . Occupation: retired     EmpFish farm managerETIRED  Social Needs  . Financial resource strain: Not on file  . Food insecurity    Worry: Not on file    Inability: Not on file  . Transportation needs    Medical: No    Non-medical: No  Tobacco Use  . Smoking status: Never Smoker  . Smokeless tobacco: Never Used  Substance and Sexual Activity  . Alcohol use: Not Currently    Alcohol/week: 0.0 standard drinks    Comment: rarely  . Drug use: No  . Sexual activity: Not Currently  Lifestyle  . Physical activity    Days per week: Not on file    Minutes per session: Not on file  . Stress: Not on file  Relationships  . Social Herbalist on phone: Not on file    Gets together: Not on file    Attends religious service: Not on file    Active member of club or organization: Not on file    Attends meetings of clubs or organizations: Not on file    Relationship status: Not on file  Other Topics Concern  . Not on file  Social History Narrative   Lives w/ husband , 2 children in [place], 2 [place]             Review of Systems: A 12 point ROS discussed and pertinent positives are indicated in the HPI above.  All other systems are negative.  Review of Systems  Constitutional: Negative for chills and fever.  Respiratory: Negative for shortness of breath.   Cardiovascular: Negative for chest pain and palpitations.  Gastrointestinal: Negative for abdominal pain.  Neurological: Negative for headaches.  Psychiatric/Behavioral: Negative for behavioral problems and confusion.    Vital Signs: BP (!) 132/51   Pulse 74   Temp 98 F (36.7 C) (Oral)   Resp 18   Ht 5' 2"  (1.575 m)   Wt 136 lb (61.7 kg)   SpO2  100%   BMI 24.87 kg/m   Physical Exam Vitals signs and nursing note reviewed.  Constitutional:      General: She is not in acute distress.    Appearance: Normal appearance.  Cardiovascular:     Rate and Rhythm: Normal rate and regular rhythm.     Heart sounds: Normal heart sounds. No murmur.  Pulmonary:     Effort: Pulmonary effort is normal. No respiratory distress.     Breath sounds: Normal breath sounds. No wheezing.  Skin:    General: Skin is warm and dry.  Neurological:     Mental Status: She is alert and oriented to person, place, and time.      MD Evaluation Airway: WNL Heart: WNL Abdomen: WNL Chest/ Lungs: WNL ASA  Classification: 3 Mallampati/Airway Score: Two   Imaging: Dg Forearm Left  Result Date: 08/02/2019 CLINICAL DATA:  Left arm cellulitis. EXAM: LEFT FOREARM - 2 VIEW COMPARISON:  None. FINDINGS: There is no evidence of fracture or other focal bone lesions. No bone erosions identified. There is diffuse soft tissue swelling identified. IMPRESSION: 1. No acute bone abnormality. 2. Diffuse soft tissue swelling. Electronically Signed   By: Kerby Moors M.D.   On: 08/02/2019 21:31   US Renal  Result Date: 08/03/2019 CLINICAL DATA:  Initial evaluation for acute renal failure. EXAM: RENAL / URINARY TRACT ULTRASOUND COMPLETE COMPARISON:  Prior CT from 05/30/2019. FINDINGS: Right Kidney: Renal measurements: 10.7 x 5.8 x 5.6 cm = volume: 180 mL. Diffusely increased echogenicity within the renal parenchyma, compatible with medical renal disease. No nephrolithiasis or hydronephrosis. 2.2 x 1.7 x 1.9 cm simple cyst present at the upper pole. Left Kidney: Renal measurements: 8.3 x 4.8 x 4.1 cm = volume: 85.2 mL. Diffusely increased echogenicity within the renal parenchyma, compatible with medical renal disease. No nephrolithiasis or hydronephrosis. No focal renal mass. Bladder: Appears normal for degree of  bladder distention. Other: None. IMPRESSION: 1. Diffusely  increased echogenicity within the renal parenchyma, compatible with medical renal disease. No hydronephrosis. 2. 2.2 cm simple right renal cyst. Electronically Signed   By: Jeannine Boga M.D.   On: 08/03/2019 01:08   Dg Hand Complete Left  Result Date: 08/02/2019 CLINICAL DATA:  Left arm cellulitis EXAM: LEFT HAND - COMPLETE 3+ VIEW COMPARISON:  None. FINDINGS: There is no evidence of fracture or dislocation. 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 Diffuse dorsal soft tissue edema is noted. No subcutaneous emphysema seen. IMPRESSION: Diffuse dorsal subcutaneous edema surrounding the wrist and hand, likely consistent with cellulitis. Electronically Signed   By: Prudencio Pair M.D.   On: 08/02/2019 21:33    Labs:  CBC: Recent Labs    06/22/19 07/20/19 1029 07/23/19 0955 08/02/19 2149 08/03/19 0328 08/03/19 0504  WBC 8.3 6.9  --  5.0 5.7  --   HGB 8.4* 8.1* 7.4* 7.3* 6.2* 6.7*  HCT 25* 24.7*  --  22.6* 18.5* 19.9*  PLT 209 319.0  --  322 274  --     COAGS: Recent Labs    04/07/19 2157 05/16/19 1500 05/30/19 0616  INR 1.1 1.1 1.0  APTT 30 28 26     BMP: Recent Labs    06/08/19 0513 06/09/19 0831  07/20/19 1029 07/23/19 0955 08/02/19 2149 08/03/19 0328  NA 139 138   < > 143 140 135 134*  K 4.9 3.4*   < > 4.6 4.4 5.1 5.1  CL 108 106  --  106 103 101 101  CO2 21* 22  --  25 23 15* 16*  GLUCOSE 120* 126*  --  92 100* 207* 150*  BUN 19 34*   < > 78* 79* 108* 113*  CALCIUM 8.2* 8.1*  --  6.7* 6.4* 6.5* 6.1*  CREATININE 2.24* 2.20*   < > 3.71* 4.13* 6.02* 5.92*  GFRNONAA 20* 20*  --   --   --  6* 6*  GFRAA 23* 24*  --   --   --  7* 7*   < > = values in this interval not displayed.    LIVER FUNCTION TESTS: Recent Labs    06/07/19 0501 06/08/19 0513 06/09/19 0831 08/02/19 2149  BILITOT 0.4 0.3 0.4 0.4  AST 13* 13* 15 20  ALT 8 9 12 26   ALKPHOS 52 61 63 51  PROT 5.3* 5.8* 6.4* 6.0*  ALBUMIN 2.1* 2.3* 2.5*  2.6*     Assessment and Plan:  AKI superimposed on CKD stage IV. Plan for image-guided tunneled HD catheter placement tentatively for today in IR. Patient is NPO. Afebrile and WBCs WNL. Hgb 6.2 g/dL today, patient currently receiving transfusion for this- ok to proceed per Dr. Pascal Lux. She does not take blood thinners. INR pending.  Risks and benefits discussed with the patient including, but not limited to bleeding, infection, vascular injury, pneumothorax which may require chest tube placement, air embolism or even death. All of the patient's questions were answered, patient is agreeable to proceed. Consent signed and in chart.   Thank you for this interesting consult.  I greatly enjoyed meeting VANETTE NOGUCHI and look forward to participating in their care.  A copy of this report was sent to the requesting provider on this date.  Electronically Signed: Earley Abide, PA-C 08/03/2019, 10:48 AM   I spent a total of 40 Minutes in face to face in clinical consultation, greater than 50%  of which was counseling/coordinating care for AKI/tunneled HD catheter placement.

## 2019-08-04 ENCOUNTER — Inpatient Hospital Stay (HOSPITAL_COMMUNITY): Payer: Medicare Other

## 2019-08-04 DIAGNOSIS — R609 Edema, unspecified: Secondary | ICD-10-CM

## 2019-08-04 DIAGNOSIS — D539 Nutritional anemia, unspecified: Secondary | ICD-10-CM | POA: Diagnosis not present

## 2019-08-04 LAB — TYPE AND SCREEN
ABO/RH(D): A POS
Antibody Screen: NEGATIVE
Unit division: 0
Unit division: 0

## 2019-08-04 LAB — HEPATITIS B CORE ANTIBODY, TOTAL: Hep B Core Total Ab: NONREACTIVE

## 2019-08-04 LAB — OCCULT BLOOD X 1 CARD TO LAB, STOOL: Fecal Occult Bld: POSITIVE — AB

## 2019-08-04 LAB — BPAM RBC
Blood Product Expiration Date: 202010302359
Blood Product Expiration Date: 202011112359
ISSUE DATE / TIME: 202010230910
ISSUE DATE / TIME: 202010231305
Unit Type and Rh: 6200
Unit Type and Rh: 6200

## 2019-08-04 LAB — BASIC METABOLIC PANEL
Anion gap: 12 (ref 5–15)
BUN: 58 mg/dL — ABNORMAL HIGH (ref 8–23)
CO2: 22 mmol/L (ref 22–32)
Calcium: 6.3 mg/dL — CL (ref 8.9–10.3)
Chloride: 105 mmol/L (ref 98–111)
Creatinine, Ser: 3.92 mg/dL — ABNORMAL HIGH (ref 0.44–1.00)
GFR calc Af Amer: 12 mL/min — ABNORMAL LOW (ref >60)
GFR calc non Af Amer: 10 mL/min — ABNORMAL LOW (ref >60)
Glucose, Bld: 91 mg/dL (ref 70–99)
Potassium: 4 mmol/L (ref 3.5–5.1)
Sodium: 139 mmol/L (ref 135–145)

## 2019-08-04 LAB — CBC
HCT: 26.9 % — ABNORMAL LOW (ref 36.0–46.0)
Hemoglobin: 9.5 g/dL — ABNORMAL LOW (ref 12.0–15.0)
MCH: 32 pg (ref 26.0–34.0)
MCHC: 35.3 g/dL (ref 30.0–36.0)
MCV: 90.6 fL (ref 80.0–100.0)
Platelets: 172 10*3/uL (ref 150–400)
RBC: 2.97 MIL/uL — ABNORMAL LOW (ref 3.87–5.11)
RDW: 18.2 % — ABNORMAL HIGH (ref 11.5–15.5)
WBC: 4.7 10*3/uL (ref 4.0–10.5)
nRBC: 0 % (ref 0.0–0.2)

## 2019-08-04 LAB — HEPATIC FUNCTION PANEL
ALT: 17 U/L (ref 0–44)
AST: 14 U/L — ABNORMAL LOW (ref 15–41)
Albumin: 1.8 g/dL — ABNORMAL LOW (ref 3.5–5.0)
Alkaline Phosphatase: 38 U/L (ref 38–126)
Bilirubin, Direct: <0.1 mg/dL (ref 0.0–0.2)
Total Bilirubin: 0.2 mg/dL — ABNORMAL LOW (ref 0.3–1.2)
Total Protein: 4.4 g/dL — ABNORMAL LOW (ref 6.5–8.1)

## 2019-08-04 LAB — HEPATITIS B SURFACE ANTIBODY,QUALITATIVE: Hep B S Ab: REACTIVE — AB

## 2019-08-04 LAB — C4 COMPLEMENT: Complement C4, Body Fluid: 18 mg/dL (ref 14–44)

## 2019-08-04 LAB — GLUCOSE, CAPILLARY
Glucose-Capillary: 147 mg/dL — ABNORMAL HIGH (ref 70–99)
Glucose-Capillary: 85 mg/dL (ref 70–99)
Glucose-Capillary: 144 mg/dL — ABNORMAL HIGH (ref 70–99)
Glucose-Capillary: 179 mg/dL — ABNORMAL HIGH (ref 70–99)
Glucose-Capillary: 151 mg/dL — ABNORMAL HIGH (ref 70–99)

## 2019-08-04 LAB — HEPATITIS B SURFACE ANTIGEN: Hepatitis B Surface Ag: NONREACTIVE

## 2019-08-04 LAB — C3 COMPLEMENT: C3 Complement: 90 mg/dL (ref 82–167)

## 2019-08-04 MED ORDER — CALCITRIOL 0.25 MCG PO CAPS
0.2500 ug | ORAL_CAPSULE | Freq: Every day | ORAL | Status: DC
  Administered 2019-08-04 – 2019-08-05 (×2): 0.25 ug via ORAL
  Administered 2019-08-06: 10:00:00 via ORAL
  Administered 2019-08-07 – 2019-08-10 (×4): 0.25 ug via ORAL
  Filled 2019-08-04 (×6): qty 1

## 2019-08-04 MED ORDER — HEPARIN SODIUM (PORCINE) 1000 UNIT/ML IJ SOLN
INTRAMUSCULAR | Status: AC
  Administered 2019-08-04: 3200 [IU] via INTRAVENOUS_CENTRAL
  Filled 2019-08-04: qty 4

## 2019-08-04 MED ORDER — PANTOPRAZOLE SODIUM 40 MG PO TBEC
40.0000 mg | DELAYED_RELEASE_TABLET | Freq: Two times a day (BID) | ORAL | Status: DC
  Administered 2019-08-04 – 2019-08-10 (×13): 40 mg via ORAL
  Filled 2019-08-04 (×13): qty 1

## 2019-08-04 NOTE — Progress Notes (Signed)
Patient off unit for HD via bed by transport. Patient alert and oriented x4, no distress noted before patient left unit. Patient denied needs.

## 2019-08-04 NOTE — Progress Notes (Signed)
Patient back on unit from HD. Patient resting in bed A&O x4, no distress noted. HS meds OK to give now per Pharmacy. Patient given HS snack per her request. Will continue to monitor.

## 2019-08-04 NOTE — Consult Note (Addendum)
Consultation  Referring Provider: Dr. Tyrell Antonio   Primary Care Physician:  Colon Branch, MD Primary Gastroenterologist: Dr. Henrene Pastor        Reason for Consultation: Crohn's ileitis, "black stool", IDA         HPI:   Samantha Mckenzie is a 83 y.o. female with a past medical history significant for breast cancer status post left mastectomy/chemotherapy/radiation, anemia, newly diagnosed Crohn's ileitis, CAD and CKD stage IV, who presented to the ER on 08/02/2019 with a complaint of left arm and bilateral leg swelling.  We are consulted in regards to a worsening anemia and reports of black stool as well as for her history of Crohn's ileitis.    Today, the patient tells me that overall she feels much better since her admission in August and diagnosis of Crohn's ileitis.  She has continued her Prednisone and tells me that her right-sided abdominal pain is almost completely gone.  Denies any epigastric pain, heartburn, reflux, nausea or vomiting.  Tells me that she is unaware of any black stools, but has been on iron.    Denies fever, chills, weight loss or change in bowel habits.  Per referring physician: There were reports of black stool, the patient is on iron therapy, nursing visualized a greenish stool this morning, this was Hemoccult positive  ED course: X-rays of the left arm and wrist showed soft tissue swelling suggestive of cellulitis, CMP with a bicarb of 15, BUN 108 and creatinine 6.02 (up from 3.7 earlier this month), CBC with a macrocytic anemia hemoglobin of 7.3, BNP elevated 509, lactic acid elevated at 4.3, started on Rocephin and vancomycin     GI history:  -07/12/2019 office visit with Dr. Henrene Pastor: Following up after hospitalization regarding management of newly diagnosed ileal Crohn's disease, as discussed she had history of several CT scans revealing ileitis with upstream bowel dilation, diagnosed several times infectious enteritis, hospitalized August 2020 and felt more likely to have  Crohn's disease, colonoscopy 06/06/2019 with Dr. Fuller Plan, ileum was stenotic and could not be intubated, biopsies were nondiagnostic, started on prednisone taper and at that time was feeling better; plan: Continue prednisone taper 40 mg daily during the month of October, decreased to 30 for 14 days then 20 for 14 days, then follow-up in 2 months, starting December 1 continue prednisone 20 mg daily until further notice -01/13/2015 EGD with Dr. Henrene Pastor: Normal   Past Medical History:  Diagnosis Date   Allergic rhinitis    Anemia    Breast CA ([place])    surgery, chemo, XRT; had peripheral neuropathy (imbalance at times) after chemo   Cellulitis    Left arm, recurrent    Cellulitis LEFT arm recurrent 12/08/2011   Chronic renal disease, stage IV ([place])    Dr. Moshe Cipro   Depression    Fatigue    GERD (gastroesophageal reflux disease)    gastritis, EGD 02/2007   Gout    Hyperlipidemia    Hypertension    Macular degeneration 2011   Normal cardiac stress test 12/2014   Osteoarthritis    Renal insufficiency     Past Surgical History:  Procedure Laterality Date   BIOPSY  06/06/2019   Procedure: BIOPSY;  Surgeon: Ladene Artist, MD;  Location: [facility];  Service: Endoscopy;;   cataracts bilaterally  1 -2012   COLONOSCOPY WITH PROPOFOL N/A 06/06/2019   Procedure: COLONOSCOPY WITH PROPOFOL;  Surgeon: Ladene Artist, MD;  Location: [facility];  Service: Endoscopy;  Laterality: N/A;  IR FLUORO GUIDE CV LINE RIGHT  08/03/2019   IR US GUIDE VASC ACCESS RIGHT  08/03/2019   LUNG BIOPSY  1999   neg   MASTECTOMY Left 1999   and lymphnodes     Family History  Problem Relation Age of Onset   Breast cancer Other        ? of    Heart attack Father 68   Diabetes Sister        2   Hypertension Sister    Colon cancer Neg Hx    Stomach cancer Neg Hx      Social History   Tobacco Use   Smoking status: Never Smoker   Smokeless tobacco: Never Used  Substance  Use Topics   Alcohol use: Not Currently    Alcohol/week: 0.0 standard drinks    Comment: rarely   Drug use: No    Prior to Admission medications   Medication Sig Start Date End Date Taking? Authorizing Provider  acetaminophen (TYLENOL) 325 MG tablet Take 2 tablets (650 mg total) by mouth every 6 (six) hours as needed for mild pain, fever or headache (or Fever >/= 101). 04/12/19  Yes Emokpae, Courage, MD  allopurinol (ZYLOPRIM) 100 MG tablet Take 1 tablet (100 mg total) by mouth daily. 06/29/19  Yes Lassen, Arlo C, PA-C  amLODipine (NORVASC) 10 MG tablet Take 1 tablet (10 mg total) by mouth daily. 07/24/19  Yes Paz, Alda Berthold, MD  atorvastatin (LIPITOR) 20 MG tablet Take 1 tablet (20 mg total) by mouth daily. 06/29/19  Yes Lassen, Arlo C, PA-C  Calcium Carb-Cholecalciferol (CALTRATE 600+D3 PO) Take 1 tablet by mouth 2 (two) times daily.    Yes [provider]  Cholecalciferol (VITAMIN D3) 1000 UNITS CAPS Take 1 capsule by mouth daily.    Yes [provider]  dicyclomine (BENTYL) 10 MG capsule Take 2 capsules (20 mg total) by mouth 4 (four) times daily -  before meals and at bedtime. 06/29/19  Yes Lassen, Arlo C, PA-C  doxycycline (VIBRA-TABS) 100 MG tablet Take 1 tablet (100 mg total) by mouth 2 (two) times daily. 07/26/19  Yes Paz, Alda Berthold, MD  dronabinol (MARINOL) 2.5 MG capsule Take 1 capsule (2.5 mg total) by mouth 2 (two) times daily before a meal. 07/24/19  Yes Paz, Alda Berthold, MD  escitalopram (LEXAPRO) 10 MG tablet Take 2 tablets (20 mg total) by mouth daily. 07/06/19  Yes Colon Branch, MD  ferrous sulfate 325 (65 FE) MG tablet Take 1 tablet (325 mg total) by mouth daily with breakfast. 06/29/19 08/03/19 Yes Lassen, Arlo C, PA-C  loperamide (IMODIUM A-D) 2 MG tablet Take 2 mg by mouth 4 (four) times daily as needed for diarrhea or loose stools.   Yes [provider]  metoprolol tartrate (LOPRESSOR) 25 MG tablet Take 1 tablet (25 mg total) by mouth 2 (two) times daily. 06/29/19   Yes Lassen, Arlo C, PA-C  mirtazapine (REMERON) 15 MG tablet Take 1 tablet (15 mg total) by mouth at bedtime. 07/24/19  Yes Paz, Alda Berthold, MD  Multiple Vitamins-Minerals (CENTRUM SILVER PO) Take 1 tablet by mouth daily.    Yes [provider]  ondansetron (ZOFRAN) 4 MG tablet Take 1 tablet (4 mg total) by mouth 4 (four) times daily. 07/24/19  Yes Paz, Alda Berthold, MD  pantoprazole (PROTONIX) 40 MG tablet Take 1 tablet (40 mg total) by mouth daily before breakfast. 07/24/19  Yes Paz, Alda Berthold, MD  predniSONE (DELTASONE) 10 MG tablet Take 1  tablets a day (2m) for one month; then take 1.5 tablets a day (375m for 2 weeks; then take 1 tablet a day (2065muntil further notice 07/12/19  Yes PerIrene ShipperD  vitamin B-12 (CYANOCOBALAMIN) 1000 MCG tablet Take 1,000 mcg by mouth daily.     Yes [provider]  vitamin C (ASCORBIC ACID) 500 MG tablet Take 500 mg by mouth daily.   Yes [provider]  predniSONE (DELTASONE) 20 MG tablet Prednisone 40 mg daily during the month of October 2.  Prednisone 30 mg daily from November 1 through November 15 3.  Prednisone 20 mg daily from November 16 until further notice 07/23/19   PerIrene ShipperD    Current Facility-Administered Medications  Medication Dose Route Frequency Provider Last Rate Last Dose   0.9 %  sodium chloride infusion  250 mL Intravenous PRN Opyd, TimIlene QuaD       acetaminophen (TYLENOL) tablet 650 mg  650 mg Oral Q6H PRN Opyd, TimIlene QuaD       Or   acetaminophen (TYLENOL) suppository 650 mg  650 mg Rectal Q6H PRN Opyd, TimIlene QuaD       amLODipine (NORVASC) tablet 10 mg  10 mg Oral Daily Opyd, TimIlene QuaD   10 mg at 08/04/19 0841   atorvastatin (LIPITOR) tablet 20 mg  20 mg Oral Daily Opyd, TimIlene QuaD   20 mg at 08/04/19 0840   cefTRIAXone (ROCEPHIN) 1 g in sodium chloride 0.9 % 100 mL IVPB  1 g Intravenous Q24H OpyVianne BullsD   Stopped at 08/04/19 0339604Chlorhexidine Gluconate Cloth 2 % PADS 6  each  6 each Topical Q0600 PatElmarie ShileyD   6 each at 08/03/19 0835   insulin aspart (novoLOG) injection 0-9 Units  0-9 Units Subcutaneous TID WC Blount, XenLolita CramP       metoprolol tartrate (LOPRESSOR) tablet 25 mg  25 mg Oral BID Opyd, TimIlene QuaD   25 mg at 08/04/19 0841   ondansetron (ZOFRAN) tablet 4 mg  4 mg Oral Q6H PRN Opyd, TimIlene QuaD       Or   ondansetron (ZOFRAN) injection 4 mg  4 mg Intravenous Q6H PRN Opyd, TimIlene QuaD       pantoprazole (PROTONIX) EC tablet 40 mg  40 mg Oral BID DurAlvira PhilipsPH   40 mg at 08/04/19 0841   predniSONE (DELTASONE) tablet 40 mg  40 mg Oral Q breakfast Opyd, TimIlene QuaD   40 mg at 08/04/19 0841   sodium bicarbonate tablet 650 mg  650 mg Oral BID OpyVianne BullsD   650 mg at 08/04/19 0841   sodium chloride flush (NS) 0.9 % injection 3 mL  3 mL Intravenous Q12H Opyd, TimIlene QuaD   3 mL at 08/04/19 0255   sodium chloride flush (NS) 0.9 % injection 3 mL  3 mL Intravenous Q12H Opyd, TimIlene QuaD   3 mL at 08/04/19 0842   sodium chloride flush (NS) 0.9 % injection 3 mL  3 mL Intravenous PRN Opyd, TimIlene QuaD        Allergies as of 08/02/2019 - Review Complete 08/02/2019  Allergen Reaction Noted   Codeine Nausea And Vomiting 07/07/2007   Morphine Nausea And Vomiting 07/07/2007   Oxycodone-acetaminophen Nausea And Vomiting 07/07/2007   Sulfonamide derivatives Nausea And Vomiting 07/07/2007   Tramadol hcl Nausea And Vomiting 07/07/2007  Review of Systems:    Constitutional: No weight loss, fever or chills Skin: No rash Cardiovascular: No chest pain Respiratory: No SOB  Gastrointestinal: See HPI and otherwise negative Genitourinary: No dysuria  Neurological: No headache, dizziness or syncope Musculoskeletal: No new muscle or joint pain Hematologic: No bleeding or bruising Psychiatric: No history of depression or anxiety    Physical Exam:  Vital signs in last 24 hours: Temp:  [97.5 F (36.4 C)-98.3 F  (36.8 C)] 98.1 F (36.7 C) (10/24 0404) Pulse Rate:  [64-83] 64 (10/24 0840) Resp:  [12-18] 16 (10/24 0236) BP: (129-167)/(51-88) 167/59 (10/24 0840) SpO2:  [97 %-100 %] 100 % (10/24 0404) Weight:  [57.7 kg-65.9 kg] 57.7 kg (10/24 0400) Last BM Date: 08/02/19 General:   Very Pleasant hispanic female appears to be in NAD, Well developed, Well nourished, alert and cooperative Head:  Normocephalic and atraumatic. Eyes:   PEERL, EOMI. No icterus. Conjunctiva pink. Ears:  Normal auditory acuity. Neck:  Supple Throat: Oral cavity and pharynx without inflammation, swelling or lesion.  Lungs: Respirations even and unlabored. Lungs clear to auscultation bilaterally.   No wheezes, crackles, or rhonchi. +port with some bloody discharge under bandage upper right side Heart: Normal S1, S2. No MRG. Regular rate and rhythm. No peripheral edema, cyanosis or pallor.  Abdomen:  Soft, nondistended, nontender. No rebound or guarding. Normal bowel sounds. No appreciable masses or hepatomegaly. Rectal:  Not performed.  Msk:  Symmetrical without gross deformities. Peripheral pulses intact.  Extremities:  Without edema, no deformity or joint abnormality.  Neurologic:  Alert and  oriented x4;  grossly normal neurologically.  Skin:   Dry and intact without significant lesions or rashes. Psychiatric: Demonstrates good judgement and reason without abnormal affect or behaviors.  LAB RESULTS: Recent Labs    08/02/19 2149 08/03/19 0328 08/03/19 0504 08/03/19 2155 08/04/19 0754  WBC 5.0 5.7  --   --  4.7  HGB 7.3* 6.2* 6.7* 9.3* 9.5*  HCT 22.6* 18.5* 19.9* 26.4* 26.9*  PLT 322 274  --   --  172   BMET Recent Labs    08/02/19 2149 08/03/19 0328 08/04/19 0754  NA 135 134* 139  K 5.1 5.1 4.0  CL 101 101 105  CO2 15* 16* 22  GLUCOSE 207* 150* 91  BUN 108* 113* 58*  CREATININE 6.02* 5.92* 3.92*  CALCIUM 6.5* 6.1* 6.3*   LFT Recent Labs    08/02/19 2149  PROT 6.0*  ALBUMIN 2.6*  AST 20  ALT 26    ALKPHOS 51  BILITOT 0.4   PT/INR Recent Labs    08/03/19 2155  LABPROT 13.4  INR 1.0    STUDIES: Dg Forearm Left  Result Date: 08/02/2019 CLINICAL DATA:  Left arm cellulitis. EXAM: LEFT FOREARM - 2 VIEW COMPARISON:  None. FINDINGS: There is no evidence of fracture or other focal bone lesions. No bone erosions identified. There is diffuse soft tissue swelling identified. IMPRESSION: 1. No acute bone abnormality. 2. Diffuse soft tissue swelling. Electronically Signed   By: Kerby Moors M.D.   On: 08/02/2019 21:31   US Renal  Result Date: 08/03/2019 CLINICAL DATA:  Initial evaluation for acute renal failure. EXAM: RENAL / URINARY TRACT ULTRASOUND COMPLETE COMPARISON:  Prior CT from 05/30/2019. FINDINGS: Right Kidney: Renal measurements: 10.7 x 5.8 x 5.6 cm = volume: 180 mL. Diffusely increased echogenicity within the renal parenchyma, compatible with medical renal disease. No nephrolithiasis or hydronephrosis. 2.2 x 1.7 x 1.9 cm simple cyst present at the upper  pole. Left Kidney: Renal measurements: 8.3 x 4.8 x 4.1 cm = volume: 85.2 mL. Diffusely increased echogenicity within the renal parenchyma, compatible with medical renal disease. No nephrolithiasis or hydronephrosis. No focal renal mass. Bladder: Appears normal for degree of bladder distention. Other: None. IMPRESSION: 1. Diffusely increased echogenicity within the renal parenchyma, compatible with medical renal disease. No hydronephrosis. 2. 2.2 cm simple right renal cyst. Electronically Signed   By: Jeannine Boga M.D.   On: 08/03/2019 01:08   Ir Fluoro Guide Cv Line Right  Result Date: 08/03/2019 INDICATION: End-stage renal disease. Please perform tunnel dialysis catheter placement for the initiation of dialysis. EXAM: TUNNELED CENTRAL VENOUS HEMODIALYSIS CATHETER PLACEMENT WITH ULTRASOUND AND FLUOROSCOPIC GUIDANCE MEDICATIONS: Ancef 2 gm IV . The antibiotic was given in an appropriate time interval prior to skin puncture.  ANESTHESIA/SEDATION: Versed 1 mg IV; Fentanyl 50 mcg IV; Moderate Sedation Time:  12 minutes The patient was continuously monitored during the procedure by the interventional radiology nurse under my direct supervision. FLUOROSCOPY TIME:  24 seconds (2 mGy) COMPLICATIONS: None immediate. PROCEDURE: Informed written consent was obtained from the patient after a discussion of the risks, benefits, and alternatives to treatment. Questions regarding the procedure were encouraged and answered. The right neck and chest were prepped with chlorhexidine in a sterile fashion, and a sterile drape was applied covering the operative field. Maximum barrier sterile technique with sterile gowns and gloves were used for the procedure. A timeout was performed prior to the initiation of the procedure. After creating a small venotomy incision, a micropuncture kit was utilized to access the internal jugular vein. Real-time ultrasound guidance was utilized for vascular access including the acquisition of a permanent ultrasound image documenting patency of the accessed vessel. The microwire was utilized to measure appropriate catheter length. A stiff Glidewire was advanced to the level of the IVC and the micropuncture sheath was exchanged for a peel-away sheath. A palindrome tunneled hemodialysis catheter measuring 19 cm from tip to cuff was tunneled in a retrograde fashion from the anterior chest wall to the venotomy incision. The catheter was then placed through the peel-away sheath with tips ultimately positioned within the superior aspect of the right atrium. Final catheter positioning was confirmed and documented with a spot radiographic image. The catheter aspirates and flushes normally. The catheter was flushed with appropriate volume heparin dwells. The catheter exit site was secured with a 0-Prolene retention suture. The venotomy incision was closed with Dermabond and Steri-strips. Dressings were applied. The patient tolerated the  procedure well without immediate post procedural complication. IMPRESSION: Successful placement of 19 cm tip to cuff tunneled hemodialysis catheter via the right internal jugular vein with tips terminating within the superior aspect of the right atrium. The catheter is ready for immediate use. Electronically Signed   By: Sandi Mariscal M.D.   On: 08/03/2019 16:31   Ir US Guide Vasc Access Right  Result Date: 08/03/2019 INDICATION: End-stage renal disease. Please perform tunnel dialysis catheter placement for the initiation of dialysis. EXAM: TUNNELED CENTRAL VENOUS HEMODIALYSIS CATHETER PLACEMENT WITH ULTRASOUND AND FLUOROSCOPIC GUIDANCE MEDICATIONS: Ancef 2 gm IV . The antibiotic was given in an appropriate time interval prior to skin puncture. ANESTHESIA/SEDATION: Versed 1 mg IV; Fentanyl 50 mcg IV; Moderate Sedation Time:  12 minutes The patient was continuously monitored during the procedure by the interventional radiology nurse under my direct supervision. FLUOROSCOPY TIME:  24 seconds (2 mGy) COMPLICATIONS: None immediate. PROCEDURE: Informed written consent was obtained from the patient after a discussion  of the risks, benefits, and alternatives to treatment. Questions regarding the procedure were encouraged and answered. The right neck and chest were prepped with chlorhexidine in a sterile fashion, and a sterile drape was applied covering the operative field. Maximum barrier sterile technique with sterile gowns and gloves were used for the procedure. A timeout was performed prior to the initiation of the procedure. After creating a small venotomy incision, a micropuncture kit was utilized to access the internal jugular vein. Real-time ultrasound guidance was utilized for vascular access including the acquisition of a permanent ultrasound image documenting patency of the accessed vessel. The microwire was utilized to measure appropriate catheter length. A stiff Glidewire was advanced to the level of the IVC  and the micropuncture sheath was exchanged for a peel-away sheath. A palindrome tunneled hemodialysis catheter measuring 19 cm from tip to cuff was tunneled in a retrograde fashion from the anterior chest wall to the venotomy incision. The catheter was then placed through the peel-away sheath with tips ultimately positioned within the superior aspect of the right atrium. Final catheter positioning was confirmed and documented with a spot radiographic image. The catheter aspirates and flushes normally. The catheter was flushed with appropriate volume heparin dwells. The catheter exit site was secured with a 0-Prolene retention suture. The venotomy incision was closed with Dermabond and Steri-strips. Dressings were applied. The patient tolerated the procedure well without immediate post procedural complication. IMPRESSION: Successful placement of 19 cm tip to cuff tunneled hemodialysis catheter via the right internal jugular vein with tips terminating within the superior aspect of the right atrium. The catheter is ready for immediate use. Electronically Signed   By: Sandi Mariscal M.D.   On: 08/03/2019 16:31   Dg Hand Complete Left  Result Date: 08/02/2019 CLINICAL DATA:  Left arm cellulitis EXAM: LEFT HAND - COMPLETE 3+ VIEW COMPARISON:  None. FINDINGS: There is no evidence of fracture or dislocation. 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 Diffuse dorsal soft tissue edema is noted. No subcutaneous emphysema seen. IMPRESSION: Diffuse dorsal subcutaneous edema surrounding the wrist and hand, likely consistent with cellulitis. Electronically Signed   By: Prudencio Pair M.D.   On: 08/02/2019 21:33    Impression / Plan:   Impression: 1.  Anemia: Positive Hemoccult, reports of dark stool-on iron, hemoglobin 7.3-->6.2--> 6.7--> 2 units PRBCs-->9.3-->9.5, recent colonoscopy with suspected Crohn's iletiis- on Prednisone- likely CKD vs less likely GI bleed 2.  AKI on  CKD IV: nephrology consulted-recommending to start on dialysis 3.  Left arm cellulitis: on IV ceftriaxone 4.  Crohn's ileitis: New diagnosis in August, currently on prednisone taper  Plan: 1.  Continue PPI twice daily 2.  Continue Prednisone taper 3.  At this time will not pursue endoscopic procedures given that patient's kidney disease has worsened to the point that she is needing dialysis and there are no signs of overt GI bleeding.  Please call us though if she starts with any true melena or there is concern for acute GI bleed. 4.  Please await any further recommendations from Dr. Ardis Hughs later today.  Thank you for your kind consultation, we will likely sign off.  Again please call if you need further assistance.  Lavone Nian [facility]  08/04/2019, 9:27 AM  ________________________________________________________________________  Velora Heckler GI MD note:  I personally examined the patient, reviewed the data and agree with the assessment and plan described above.  I do not think she needs endoscopic testing. She is not having  overt GI bleeding and it is not surprising that she has hemoccult + stools given her Crohn's disease. Her acute on chronic anemia is more a reflection of her evolving renal disease.  She should continue the prednisone taper laid out by Dr. Henrene Pastor at her recent [place] and follow up in his office as previously scheduled. Certainly if she has obvious, overt bleeding please call or page.  Otherwise will sign off for now.  Owens Loffler, MD [facility] Pager [phone]

## 2019-08-04 NOTE — Progress Notes (Signed)
CRITICAL VALUE ALERT  Critical Value:  Ca 6.3  Date & Time Notied:  08/04/2019 0859  Provider Notified: Dr. Tyrell Antonio notified   Orders Received/Actions taken: MD notified no new orders at this time

## 2019-08-04 NOTE — Progress Notes (Signed)
Left upper extremity venous duplex completed. Refer to "CV Proc" under chart review to view preliminary results.  08/04/2019 9:48 AM Maudry Mayhew, MHA, RVT, RDCS, RDMS

## 2019-08-04 NOTE — Progress Notes (Signed)
Physical Therapy Evaluation Patient Details Name: Samantha Mckenzie MRN: [id number] DOB: January 17, 1936 Today's Date: 08/04/2019   History of Present Illness  83 y.o. female admitted on 08/03/19 for L UE and bil LE swelling.  L UE negative for DVT suspected cellulitis superimposed on L UE lymphedema from L mastectomy.  Pt also found to be in acute renal failure on chronic stage IV kidney disease.  HD catheter placed for her to start dialysis (new).  Pt also with metabolic acidosis, hyperkalemia, anemia (could be of chronic kidney disease, but also found to have heme (+) stool), s/p 2 units PRBCs, GI consulted (h/o Crohn's).  Pt with other significant PMH of HTN, DM, CAD, gout, breast CA.  Clinical Impression  Pt is mildly unsteady on her feet, but able to get up and walk around the unit with very little assistance.  Next session assess gait with rollator (what she uses at home).   PT to follow acutely for deficits listed below.      Follow Up Recommendations Home health PT    Equipment Recommendations  None recommended by PT    Recommendations for Other Services   NA    Precautions / Restrictions Precautions Precautions: Fall Precaution Comments: mildly unsteady on her feet even with RW (she is used to a rollator)      Mobility  Bed Mobility Overal bed mobility: Modified Independent                Transfers Overall transfer level: Needs assistance Equipment used: Rolling walker (2 wheeled) Transfers: Sit to/from Stand Sit to Stand: Modified independent (Device/Increase time)            Ambulation/Gait Ambulation/Gait assistance: Min guard Gait Distance (Feet): 300 Feet Assistive device: Rolling walker (2 wheeled) Gait Pattern/deviations: Step-through pattern;Staggering right;Staggering left     General Gait Details: Pt with mildly staggering gait pattern even with RW use, min guard assist for safety and balance.  Pt is used to a rollator and we used a normal RW  today, may make a difference to try a rollator next session.          Balance Overall balance assessment: Needs assistance Sitting-balance support: Feet supported;No upper extremity supported Sitting balance-Leahy Scale: Good     Standing balance support: Bilateral upper extremity supported;Single extremity supported Standing balance-Leahy Scale: Fair Standing balance comment: looks better with at least one UE supported for balance.                              Pertinent Vitals/Pain Pain Assessment: No/denies pain    Home Living Family/patient expects to be discharged to:: Private residence Living Arrangements: Spouse/significant other Available Help at Discharge: Family;Available 24 hours/day(both sons live close) Type of Home: House Home Access: Level entry     Home Layout: One level Home Equipment: Walker - 2 wheels;Walker - 4 wheels Additional Comments: husband can only provide supervision    Prior Function Level of Independence: Independent with assistive device(s)         Comments: uses rollator at home     Hand Dominance   Dominant Hand: Right    Extremity/Trunk Assessment   Upper Extremity Assessment Upper Extremity Assessment: Defer to OT evaluation(per pt she does have a L UE lymphedema sleeve, doesn't wear)    Lower Extremity Assessment Lower Extremity Assessment: Generalized weakness    Cervical / Trunk Assessment Cervical / Trunk Assessment: Normal  Communication   Communication:  No difficulties(Speaks Spanish, but also speaks [place] very well)  Cognition Arousal/Alertness: Awake/alert Behavior During Therapy: WFL for tasks assessed/performed Overall Cognitive Status: Within Functional Limits for tasks assessed                                               Assessment/Plan    PT Assessment Patient needs continued PT services  PT Problem List Decreased strength;Decreased activity tolerance;Decreased  balance;Decreased mobility;Decreased knowledge of use of DME       PT Treatment Interventions DME instruction;Gait training;Stair training;Functional mobility training;Therapeutic activities;Therapeutic exercise;Balance training;Patient/family education    PT Goals (Current goals can be found in the Care Plan section)  Acute Rehab PT Goals Patient Stated Goal: to get back to walking for exercise PT Goal Formulation: With patient Time For Goal Achievement: 08/18/19 Potential to Achieve Goals: Good    Frequency Min 3X/week           AM-PAC PT "6 Clicks" Mobility  Outcome Measure Help needed turning from your back to your side while in a flat bed without using bedrails?: A Little Help needed moving from lying on your back to sitting on the side of a flat bed without using bedrails?: A Little Help needed moving to and from a bed to a chair (including a wheelchair)?: None Help needed standing up from a chair using your arms (e.g., wheelchair or bedside chair)?: None Help needed to walk in hospital room?: A Little Help needed climbing 3-5 steps with a railing? : A Little 6 Click Score: 20    End of Session   Activity Tolerance: Patient limited by fatigue Patient left: in bed;with call bell/phone within reach;with bed alarm set   PT Visit Diagnosis: Muscle weakness (generalized) (M62.81);Difficulty in walking, not elsewhere classified (R26.2);Unsteadiness on feet (R26.81)    Time: 7062-3762 PT Time Calculation (min) (ACUTE ONLY): 33 min   Charges:   PT Evaluation $PT Eval Moderate Complexity: 1 Mod PT Treatments $Gait Training: 8-22 mins       Abdulaziz Toman B. Charon Akamine, PT, DPT  Acute Rehabilitation [phone] pager [phone] office  @ [facility]: [phone]   08/04/2019, 5:55 PM

## 2019-08-04 NOTE — Progress Notes (Signed)
[facility]    NEPHROLOGY PROGRESS NOTE  SUBJECTIVE: Patient seen and examined earlier today.  Denies chest pain, shortness of breath, nausea, vomiting, diarrhea or dysuria.  Overall feeling well.  Reports dialysis went fine yesterday.  All other review of systems are negative.     OBJECTIVE:  Vitals:   08/04/19 0404 08/04/19 0840  BP: (!) 150/62 (!) 167/59  Pulse: 70 64  Resp:    Temp: 98.1 F (36.7 C)   SpO2: 100%     Intake/Output Summary (Last 24 hours) at 08/04/2019 1534 Last data filed at 08/04/2019 0500 Gross per 24 hour  Intake 545.47 ml  Output 1500 ml  Net -954.53 ml      General:  AAOx3 NAD HEENT: MMM [place] AT anicteric sclera Neck:  No JVD, no adenopathy CV:  Heart RRR  Lungs:  L/S CTA bilaterally Abd:  abd SNT/ND with normal BS GU:  Bladder non-palpable Extremities:  No LE edema. Skin:  No skin rash  MEDICATIONS:  . amLODipine  10 mg Oral Daily  . atorvastatin  20 mg Oral Daily  . Chlorhexidine Gluconate Cloth  6 each Topical Q0600  . insulin aspart  0-9 Units Subcutaneous TID WC  . metoprolol tartrate  25 mg Oral BID  . pantoprazole  40 mg Oral BID  . predniSONE  40 mg Oral Q breakfast  . sodium bicarbonate  650 mg Oral BID  . sodium chloride flush  3 mL Intravenous Q12H  . sodium chloride flush  3 mL Intravenous Q12H       LABS:   CBC Latest Ref Rng & Units 08/04/2019 08/03/2019 08/03/2019  WBC 4.0 - 10.5 K/uL 4.7 - -  Hemoglobin 12.0 - 15.0 g/dL 9.5(L) 9.3(L) 6.7(LL)  Hematocrit 36.0 - 46.0 % 26.9(L) 26.4(L) 19.9(L)  Platelets 150 - 400 K/uL 172 - -    CMP Latest Ref Rng & Units 08/04/2019 08/03/2019 08/02/2019  Glucose 70 - 99 mg/dL 91 150(H) 207(H)  BUN 8 - 23 mg/dL 58(H) 113(H) 108(H)  Creatinine 0.44 - 1.00 mg/dL 3.92(H) 5.92(H) 6.02(H)  Sodium 135 - 145 mmol/L 139 134(L) 135  Potassium 3.5 - 5.1 mmol/L 4.0 5.1 5.1  Chloride 98 - 111 mmol/L 105 101 101  CO2 22 - 32 mmol/L 22 16(L) 15(L)  Calcium 8.9 - 10.3 mg/dL  6.3(LL) 6.1(LL) 6.5(L)  Total Protein 6.5 - 8.1 g/dL 4.4(L) - 6.0(L)  Total Bilirubin 0.3 - 1.2 mg/dL 0.2(L) - 0.4  Alkaline Phos 38 - 126 U/L 38 - 51  AST 15 - 41 U/L 14(L) - 20  ALT 0 - 44 U/L 17 - 26    Lab Results  Component Value Date   CALCIUM 6.3 (LL) 08/04/2019   CAION 3.61 (L) 07/23/2019       Component Value Date/Time   COLORURINE YELLOW 08/02/2019 [place] 08/02/2019 2254   LABSPEC 1.010 08/02/2019 2254   PHURINE 5.0 08/02/2019 2254   GLUCOSEU 50 (A) 08/02/2019 2254   HGBUR NEGATIVE 08/02/2019 2254   BILIRUBINUR NEGATIVE 08/02/2019 2254   KETONESUR NEGATIVE 08/02/2019 2254   PROTEINUR NEGATIVE 08/02/2019 2254   UROBILINOGEN 0.2 12/29/2014 1400   NITRITE NEGATIVE 08/02/2019 2254   LEUKOCYTESUR NEGATIVE 08/02/2019 2254      Component Value Date/Time   TCO2 20 (L) 05/30/2019 0654       Component Value Date/Time   IRON 57 07/23/2019 0955   TIBC 168 (L) 04/07/2019 2157   FERRITIN 298.5 (H) 07/23/2019 0955   IRONPCTSAT  10 (L) 04/07/2019 2157       ASSESSMENT/PLAN:    1.  Acute kidney injury on chronic kidney disease stage IV: Appears oliguric at this time without any acute electrolyte abnormality-mild metabolic acidosis and hyperkalemia noted.  Possible differential diagnosis include acute interstitial nephritis, ATN and very less likely infection associated GN.    Urinalysis is bland, arguing against interstitial nephritis and glomerulonephritis.  Dialysis started yesterday for uremia.  We will plan a second treatment today.  She will likely need renal biopsy next week for diagnosis/prognosis of her acute kidney injury but would like to see hemoglobin stable prior to this consideration..  She is currently on prednisone for her inflammatory bowel disease/Crohn's.  No evidence of obstruction on ultrasound.  2.  Volume overload: Should improve with dialysis.  3.  Anion gap metabolic acidosis: Secondary to acute kidney injury and mild lactic acidosis,  improving with dialysis.  Discontinue sodium bicarbonate.  4.  Cellulitis of left upper extremity: Superimposed on chronic lymphedema following ipsilateral mastectomy/lymph node dissection.  On ceftriaxone and status post vancomycin-monitor cautiously for vancomycin toxicity.  5.  Anemia: Likely anemia of chronic disease with dilutional effect from current volume status.  Iron studies checked.  6.  Hypertension: Monitor on current antihypertensive therapy/ultrafiltration with HD.  7.  Metabolic bone disease.  Check intact PTH and phosphorus.  Calcium on the low side.  We will add calcitriol.    [place], DO, [place]

## 2019-08-04 NOTE — Progress Notes (Signed)
PROGRESS NOTE    Samantha Mckenzie  VPX:[id number] DOB: [birth year] DOA: 08/02/2019 PCP: Colon Branch, MD   Brief Narrative: 83 year old with past medical history significant for depression, breast cancer status post left mastectomy/chemotherapy radiation, anemia Crohn's ileitis, CAD and CKD stage IV who presents to the emergency department for evaluation of progressive left arm swelling and bilateral lower extremity edema.  Patient reports occasional left arm swelling since her left mastectomy 20 years ago, but this has been much worse in recent weeks, also report redness.  She was treated as an outpatient with 2 courses of antibiotics without significant improvement.  She denies hematochezia melena.  Evaluation in the ED patient was found to be afebrile, blood pressure stable.  Radiograph of the left forearm and left wrist demonstrate soft tissue swelling of the forearm subcutaneous edema no bony abnormality and suggestive of cellulitis.  Patient was found to have acute on chronic worsening renal function creatinine at 6, bicarb at 15, BUN 108.  CBC hemoglobin 7.3.\  Patient has been admitted for further management of acute on chronic renal failure left arm cellulitis that failed oral antibiotics. Patient hemoglobin dropped further to 6.8.  She reports black stool, which she is taking oral iron.  She denies hematochezia.   Assessment & Plan:   Principal Problem:   Acute renal failure superimposed on stage 4 chronic kidney disease (HCC) Active Problems:   Diabetes mellitus type 2 with complications (HCC)   Macrocytic anemia   Essential hypertension   Cellulitis of arm, left   Acidosis   1-Acute kidney injuries superimposed on chronic kidney disease a stage IV Metabolic acidosis hyperkalemia: -Patient presented with a BUN 108 and creatinine at 6.  Earlier this month creatinine 3.7 -Nephrology has been consulted and is recommending to start patient on hemodialysis. -Radiology has been  consulted for end of tunnel hemodialysis catheter placement. -Per nephrology patient might require renal biopsy during this hospitalization. -Tolerated HD yesterday well.   2-Left arm cellulitis: Cellulitis superimposed on lymphedema. Continue with IV ceftriaxone. Continue with elevation of the arm. [place] negative for DVT.  Improved.   3-Anemia: This could be anemia of chronic kidney disease. No active bleeding.  She report black stool but she is taking oral iron. We will check occult blood but these might be positive with her history of Crohn's ileitis. Received 2 units of packed red blood cell. I have started PPI twice daily. Monitor hemoglobin and monitor for melena. GI consulted.   4-DM; Continue with a sliding scale insulin.  5-Hypertension: Continue with metoprolol and Norvasc 6-CAD: Continue with the statins and beta-blocker. 7-Crohn's disease: Continue with prednisone Hypocalcemia; corrected by albumin. Corrected calcium 8.1  Estimated body mass index is 23.27 kg/m as calculated from the following:   Height as of this encounter: 5' 2"  (1.575 m).   Weight as of this encounter: 57.7 kg.   DVT prophylaxis: SCDs due to anemia. Code Status: DNR Family Communication: Care discussed with patient, decline that I contact family Disposition Plan: Remain in the hospital for further treatment of AKI, anemia.  Left upper extremity cellulitis requiring IV antibiotics.  Blood transfusion. Consultants:   Nephrology  Procedures:   Hemodialysis tunnel catheter  Antimicrobials:  Ceftriaxone 10-23  Subjective: She is feeling better, had BM today , greenish.    Objective: Vitals:   08/04/19 0236 08/04/19 0400 08/04/19 0404 08/04/19 0840  BP: (!) 156/67  (!) 150/62 (!) 167/59  Pulse: 75  70 64  Resp: 16  Temp: 98.1 F (36.7 C)  98.1 F (36.7 C)   TempSrc: Oral  Oral   SpO2: 100%  100%   Weight:  57.7 kg    Height:        Intake/Output Summary (Last 24 hours) at  08/04/2019 0902 Last data filed at 08/04/2019 0500 Gross per 24 hour  Intake 865.47 ml  Output 1500 ml  Net -634.53 ml   Filed Weights   08/03/19 2255 08/04/19 0133 08/04/19 0400  Weight: 65.9 kg 64 kg 57.7 kg    Examination:  General exam: NAD Respiratory system: CTA Cardiovascular system: S 1, S 2 RRR Gastrointestinal system: BS present, soft, nt Central nervous system: Non focal.  Extremities: Symmetric power.  Skin; Redness has decreased   Data Reviewed: I have personally reviewed following labs and imaging studies  CBC: Recent Labs  Lab 08/02/19 2149 08/03/19 0328 08/03/19 0504 08/03/19 2155 08/04/19 0754  WBC 5.0 5.7  --   --  4.7  NEUTROABS 4.2 4.4  --   --   --   HGB 7.3* 6.2* 6.7* 9.3* 9.5*  HCT 22.6* 18.5* 19.9* 26.4* 26.9*  MCV 102.3* 96.9  --   --  90.6  PLT 322 274  --   --  258   Basic Metabolic Panel: Recent Labs  Lab 08/02/19 2149 08/03/19 0328 08/04/19 0754  NA 135 134* 139  K 5.1 5.1 4.0  CL 101 101 105  CO2 15* 16* 22  GLUCOSE 207* 150* 91  BUN 108* 113* 58*  CREATININE 6.02* 5.92* 3.92*  CALCIUM 6.5* 6.1* 6.3*   GFR: Estimated Creatinine Clearance: 9.1 mL/min (A) (by C-G formula based on SCr of 3.92 mg/dL (H)). Liver Function Tests: Recent Labs  Lab 08/02/19 2149  AST 20  ALT 26  ALKPHOS 51  BILITOT 0.4  PROT 6.0*  ALBUMIN 2.6*   No results for input(s): LIPASE, AMYLASE in the last 168 hours. No results for input(s): AMMONIA in the last 168 hours. Coagulation Profile: Recent Labs  Lab 08/03/19 2155  INR 1.0   Cardiac Enzymes: No results for input(s): CKTOTAL, CKMB, CKMBINDEX, TROPONINI in the last 168 hours. BNP (last 3 results) No results for input(s): PROBNP in the last 8760 hours. HbA1C: No results for input(s): HGBA1C in the last 72 hours. CBG: Recent Labs  Lab 08/03/19 0805 08/03/19 1134 08/03/19 1657 08/04/19 0407 08/04/19 0801  GLUCAP 85 95 144* 147* 85   Lipid Profile: No results for input(s):  CHOL, HDL, LDLCALC, TRIG, CHOLHDL, LDLDIRECT in the last 72 hours. Thyroid Function Tests: No results for input(s): TSH, T4TOTAL, FREET4, T3FREE, THYROIDAB in the last 72 hours. Anemia Panel: No results for input(s): VITAMINB12, FOLATE, FERRITIN, TIBC, IRON, RETICCTPCT in the last 72 hours. Sepsis Labs: Recent Labs  Lab 08/02/19 2149 08/03/19 0328  LATICACIDVEN 2.3*   4.3* 1.2    Recent Results (from the past 240 hour(s))  Blood culture (routine x 2)     Status: None (Preliminary result)   Collection Time: 08/02/19  9:49 PM   Specimen: BLOOD  Result Value Ref Range Status   Specimen Description   Final    BLOOD RIGHT ANTECUBITAL Performed at [facility] [address] [place], [place] [address]    Special Requests   Final    BOTTLES DRAWN AEROBIC AND ANAEROBIC Blood Culture adequate volume Performed at [facility] [address], [place], [place] [address]    Culture   Final    NO GROWTH  1 DAY Performed at [facility], [place] [address], [place], [place] [address]    Report Status PENDING  Incomplete  Blood culture (routine x 2)     Status: None (Preliminary result)   Collection Time: 08/02/19  9:49 PM   Specimen: BLOOD  Result Value Ref Range Status   Specimen Description   Final    BLOOD RIGHT ANTECUBITAL Performed at [facility] [address], [place], [place] [address]    Special Requests   Final    BOTTLES DRAWN AEROBIC AND ANAEROBIC Blood Culture results may not be optimal due to an inadequate volume of blood received in culture bottles Performed at [facility] [address], [place],  [place] [address]    Culture   Final    NO GROWTH 1 DAY Performed at [facility], [place] [address], [place], [place] [address]    Report Status PENDING  Incomplete  SARS CORONAVIRUS 2 (TAT 6-24 HRS) Nasopharyngeal Nasal Mucosa     Status: None   Collection Time: 08/03/19  2:40 AM    Specimen: Nasal Mucosa; Nasopharyngeal  Result Value Ref Range Status   SARS Coronavirus 2 NEGATIVE NEGATIVE Final    Comment: (NOTE) SARS-CoV-2 target nucleic acids are NOT DETECTED. The SARS-CoV-2 RNA is generally detectable in upper and lower respiratory specimens during the acute phase of infection. Negative results do not preclude SARS-CoV-2 infection, do not rule out co-infections with other pathogens, and should not be used as the sole basis for treatment or other patient management decisions. Negative results must be combined with clinical observations, patient history, and epidemiological information. The expected result is Negative. Fact Sheet for Patients: SugarRoll.be Fact Sheet for Healthcare Providers: https://www.woods-mathews.com/ This test is not yet approved or cleared by the [place] FDA and  has been authorized for detection and/or diagnosis of SARS-CoV-2 by FDA under an Emergency Use Authorization (EUA). This EUA will remain  in effect (meaning this test can be used) for the duration of the COVID-19 declaration under Section 56 4(b)(1) of the Act, 21 U.S.C. section 360bbb-3(b)(1), unless the authorization is terminated or revoked sooner. Performed at [facility], [place] [address], [place], [place] [address]   MRSA PCR Screening     Status: None   Collection Time: 08/03/19  2:40 AM  Result Value Ref Range Status   MRSA by PCR NEGATIVE NEGATIVE Final    Comment:        The GeneXpert MRSA Assay (FDA approved for NASAL specimens only), is one component of a comprehensive MRSA colonization surveillance program. It is not intended to diagnose MRSA infection nor to guide or monitor treatment for MRSA infections. Performed at [facility], [place] [address] [place], [place] [address]          Radiology Studies: Dg Forearm Left  Result Date: 08/02/2019 CLINICAL DATA:  Left arm cellulitis. EXAM:  LEFT FOREARM - 2 VIEW COMPARISON:  None. FINDINGS: There is no evidence of fracture or other focal bone lesions. No bone erosions identified. There is diffuse soft tissue swelling identified. IMPRESSION: 1. No acute bone abnormality. 2. Diffuse soft tissue swelling. Electronically Signed   By: Kerby Moors M.D.   On: 08/02/2019 21:31   US Renal  Result Date: 08/03/2019 CLINICAL DATA:  Initial evaluation for acute renal failure. EXAM: RENAL / URINARY TRACT ULTRASOUND COMPLETE COMPARISON:  Prior CT from 05/30/2019. FINDINGS: Right Kidney: Renal measurements: 10.7 x 5.8 x 5.6 cm =  volume: 180 mL. Diffusely increased echogenicity within the renal parenchyma, compatible with medical renal disease. No nephrolithiasis or hydronephrosis. 2.2 x 1.7 x 1.9 cm simple cyst present at the upper pole. Left Kidney: Renal measurements: 8.3 x 4.8 x 4.1 cm = volume: 85.2 mL. Diffusely increased echogenicity within the renal parenchyma, compatible with medical renal disease. No nephrolithiasis or hydronephrosis. No focal renal mass. Bladder: Appears normal for degree of bladder distention. Other: None. IMPRESSION: 1. Diffusely increased echogenicity within the renal parenchyma, compatible with medical renal disease. No hydronephrosis. 2. 2.2 cm simple right renal cyst. Electronically Signed   By: Jeannine Boga M.D.   On: 08/03/2019 01:08   Ir Fluoro Guide Cv Line Right  Result Date: 08/03/2019 INDICATION: End-stage renal disease. Please perform tunnel dialysis catheter placement for the initiation of dialysis. EXAM: TUNNELED CENTRAL VENOUS HEMODIALYSIS CATHETER PLACEMENT WITH ULTRASOUND AND FLUOROSCOPIC GUIDANCE MEDICATIONS: Ancef 2 gm IV . The antibiotic was given in an appropriate time interval prior to skin puncture. ANESTHESIA/SEDATION: Versed 1 mg IV; Fentanyl 50 mcg IV; Moderate Sedation Time:  12 minutes The patient was continuously monitored during the procedure by the interventional radiology nurse under my  direct supervision. FLUOROSCOPY TIME:  24 seconds (2 mGy) COMPLICATIONS: None immediate. PROCEDURE: Informed written consent was obtained from the patient after a discussion of the risks, benefits, and alternatives to treatment. Questions regarding the procedure were encouraged and answered. The right neck and chest were prepped with chlorhexidine in a sterile fashion, and a sterile drape was applied covering the operative field. Maximum barrier sterile technique with sterile gowns and gloves were used for the procedure. A timeout was performed prior to the initiation of the procedure. After creating a small venotomy incision, a micropuncture kit was utilized to access the internal jugular vein. Real-time ultrasound guidance was utilized for vascular access including the acquisition of a permanent ultrasound image documenting patency of the accessed vessel. The microwire was utilized to measure appropriate catheter length. A stiff Glidewire was advanced to the level of the IVC and the micropuncture sheath was exchanged for a peel-away sheath. A palindrome tunneled hemodialysis catheter measuring 19 cm from tip to cuff was tunneled in a retrograde fashion from the anterior chest wall to the venotomy incision. The catheter was then placed through the peel-away sheath with tips ultimately positioned within the superior aspect of the right atrium. Final catheter positioning was confirmed and documented with a spot radiographic image. The catheter aspirates and flushes normally. The catheter was flushed with appropriate volume heparin dwells. The catheter exit site was secured with a 0-Prolene retention suture. The venotomy incision was closed with Dermabond and Steri-strips. Dressings were applied. The patient tolerated the procedure well without immediate post procedural complication. IMPRESSION: Successful placement of 19 cm tip to cuff tunneled hemodialysis catheter via the right internal jugular vein with tips  terminating within the superior aspect of the right atrium. The catheter is ready for immediate use. Electronically Signed   By: Sandi Mariscal M.D.   On: 08/03/2019 16:31   Ir US Guide Vasc Access Right  Result Date: 08/03/2019 INDICATION: End-stage renal disease. Please perform tunnel dialysis catheter placement for the initiation of dialysis. EXAM: TUNNELED CENTRAL VENOUS HEMODIALYSIS CATHETER PLACEMENT WITH ULTRASOUND AND FLUOROSCOPIC GUIDANCE MEDICATIONS: Ancef 2 gm IV . The antibiotic was given in an appropriate time interval prior to skin puncture. ANESTHESIA/SEDATION: Versed 1 mg IV; Fentanyl 50 mcg IV; Moderate Sedation Time:  12 minutes The patient was continuously monitored during the procedure  by the interventional radiology nurse under my direct supervision. FLUOROSCOPY TIME:  24 seconds (2 mGy) COMPLICATIONS: None immediate. PROCEDURE: Informed written consent was obtained from the patient after a discussion of the risks, benefits, and alternatives to treatment. Questions regarding the procedure were encouraged and answered. The right neck and chest were prepped with chlorhexidine in a sterile fashion, and a sterile drape was applied covering the operative field. Maximum barrier sterile technique with sterile gowns and gloves were used for the procedure. A timeout was performed prior to the initiation of the procedure. After creating a small venotomy incision, a micropuncture kit was utilized to access the internal jugular vein. Real-time ultrasound guidance was utilized for vascular access including the acquisition of a permanent ultrasound image documenting patency of the accessed vessel. The microwire was utilized to measure appropriate catheter length. A stiff Glidewire was advanced to the level of the IVC and the micropuncture sheath was exchanged for a peel-away sheath. A palindrome tunneled hemodialysis catheter measuring 19 cm from tip to cuff was tunneled in a retrograde fashion from the  anterior chest wall to the venotomy incision. The catheter was then placed through the peel-away sheath with tips ultimately positioned within the superior aspect of the right atrium. Final catheter positioning was confirmed and documented with a spot radiographic image. The catheter aspirates and flushes normally. The catheter was flushed with appropriate volume heparin dwells. The catheter exit site was secured with a 0-Prolene retention suture. The venotomy incision was closed with Dermabond and Steri-strips. Dressings were applied. The patient tolerated the procedure well without immediate post procedural complication. IMPRESSION: Successful placement of 19 cm tip to cuff tunneled hemodialysis catheter via the right internal jugular vein with tips terminating within the superior aspect of the right atrium. The catheter is ready for immediate use. Electronically Signed   By: Sandi Mariscal M.D.   On: 08/03/2019 16:31   Dg Hand Complete Left  Result Date: 08/02/2019 CLINICAL DATA:  Left arm cellulitis EXAM: LEFT HAND - COMPLETE 3+ VIEW COMPARISON:  None. FINDINGS: There is no evidence of fracture or dislocation. 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 Diffuse dorsal soft tissue edema is noted. No subcutaneous emphysema seen. IMPRESSION: Diffuse dorsal subcutaneous edema surrounding the wrist and hand, likely consistent with cellulitis. Electronically Signed   By: Prudencio Pair M.D.   On: 08/02/2019 21:33        Scheduled Meds:  amLODipine  10 mg Oral Daily   atorvastatin  20 mg Oral Daily   Chlorhexidine Gluconate Cloth  6 each Topical Q0600   insulin aspart  0-9 Units Subcutaneous TID WC   metoprolol tartrate  25 mg Oral BID   pantoprazole  40 mg Oral BID   predniSONE  40 mg Oral Q breakfast   sodium bicarbonate  650 mg Oral BID   sodium chloride flush  3 mL Intravenous Q12H   sodium chloride flush  3 mL Intravenous Q12H    Continuous Infusions:  sodium chloride     cefTRIAXone (ROCEPHIN)  IV Stopped (08/04/19 0333)     LOS: 2 days    Time spent: 35 minutes     Elmarie Shiley, MD [facility] Pager [phone]  If 7PM-7AM, please contact night-coverage [email or web address] Password [email or web address] 08/04/2019, 9:02 AM

## 2019-08-05 LAB — RENAL FUNCTION PANEL
Albumin: 1.9 g/dL — ABNORMAL LOW (ref 3.5–5.0)
Anion gap: 15 (ref 5–15)
BUN: 75 mg/dL — ABNORMAL HIGH (ref 8–23)
CO2: 22 mmol/L (ref 22–32)
Calcium: 6.1 mg/dL — CL (ref 8.9–10.3)
Chloride: 102 mmol/L (ref 98–111)
Creatinine, Ser: 4.85 mg/dL — ABNORMAL HIGH (ref 0.44–1.00)
GFR calc Af Amer: 9 mL/min — ABNORMAL LOW (ref >60)
GFR calc non Af Amer: 8 mL/min — ABNORMAL LOW (ref >60)
Glucose, Bld: 116 mg/dL — ABNORMAL HIGH (ref 70–99)
Phosphorus: 5.1 mg/dL — ABNORMAL HIGH (ref 2.5–4.6)
Potassium: 3.5 mmol/L (ref 3.5–5.1)
Sodium: 139 mmol/L (ref 135–145)

## 2019-08-05 LAB — BASIC METABOLIC PANEL
Anion gap: 11 (ref 5–15)
BUN: <5 mg/dL — ABNORMAL LOW (ref 8–23)
CO2: 26 mmol/L (ref 22–32)
Calcium: 7.4 mg/dL — ABNORMAL LOW (ref 8.9–10.3)
Chloride: 97 mmol/L — ABNORMAL LOW (ref 98–111)
Creatinine, Ser: 0.43 mg/dL — ABNORMAL LOW (ref 0.44–1.00)
GFR calc Af Amer: >60 mL/min (ref >60)
GFR calc non Af Amer: >60 mL/min (ref >60)
Glucose, Bld: 104 mg/dL — ABNORMAL HIGH (ref 70–99)
Potassium: 3 mmol/L — ABNORMAL LOW (ref 3.5–5.1)
Sodium: 134 mmol/L — ABNORMAL LOW (ref 135–145)

## 2019-08-05 LAB — GLUCOSE, CAPILLARY
Glucose-Capillary: 103 mg/dL — ABNORMAL HIGH (ref 70–99)
Glucose-Capillary: 108 mg/dL — ABNORMAL HIGH (ref 70–99)
Glucose-Capillary: 112 mg/dL — ABNORMAL HIGH (ref 70–99)
Glucose-Capillary: 122 mg/dL — ABNORMAL HIGH (ref 70–99)
Glucose-Capillary: 135 mg/dL — ABNORMAL HIGH (ref 70–99)
Glucose-Capillary: 192 mg/dL — ABNORMAL HIGH (ref 70–99)
Glucose-Capillary: 256 mg/dL — ABNORMAL HIGH (ref 70–99)

## 2019-08-05 LAB — CBC
HCT: 28.2 % — ABNORMAL LOW (ref 36.0–46.0)
HCT: 30.2 % — ABNORMAL LOW (ref 36.0–46.0)
Hemoglobin: 10.4 g/dL — ABNORMAL LOW (ref 12.0–15.0)
Hemoglobin: 9.7 g/dL — ABNORMAL LOW (ref 12.0–15.0)
MCH: 32.2 pg (ref 26.0–34.0)
MCH: 32.2 pg (ref 26.0–34.0)
MCHC: 34.4 g/dL (ref 30.0–36.0)
MCHC: 34.4 g/dL (ref 30.0–36.0)
MCV: 93.5 fL (ref 80.0–100.0)
MCV: 93.7 fL (ref 80.0–100.0)
Platelets: 132 10*3/uL — ABNORMAL LOW (ref 150–400)
Platelets: 177 10*3/uL (ref 150–400)
RBC: 3.01 MIL/uL — ABNORMAL LOW (ref 3.87–5.11)
RBC: 3.23 MIL/uL — ABNORMAL LOW (ref 3.87–5.11)
RDW: 18.2 % — ABNORMAL HIGH (ref 11.5–15.5)
RDW: 18.2 % — ABNORMAL HIGH (ref 11.5–15.5)
WBC: 5.5 10*3/uL (ref 4.0–10.5)
WBC: 6.8 10*3/uL (ref 4.0–10.5)
nRBC: 0 % (ref 0.0–0.2)
nRBC: 0.4 % — ABNORMAL HIGH (ref 0.0–0.2)

## 2019-08-05 LAB — VITAMIN D 25 HYDROXY (VIT D DEFICIENCY, FRACTURES): Vit D, 25-Hydroxy: 44.55 ng/mL (ref 30–100)

## 2019-08-05 LAB — PARATHYROID HORMONE, INTACT (NO CA): PTH: 57 pg/mL (ref 15–65)

## 2019-08-05 LAB — HEPATITIS C ANTIBODY: HCV Ab: NONREACTIVE

## 2019-08-05 LAB — PHOSPHORUS: Phosphorus: 5.6 mg/dL — ABNORMAL HIGH (ref 2.5–4.6)

## 2019-08-05 MED ORDER — HEPARIN SODIUM (PORCINE) 1000 UNIT/ML DIALYSIS
1000.0000 [IU] | INTRAMUSCULAR | Status: DC | PRN
  Administered 2019-08-06: 10:00:00 via INTRAVENOUS_CENTRAL
  Filled 2019-08-05: qty 1

## 2019-08-05 MED ORDER — ALTEPLASE 2 MG IJ SOLR: 2.0000 mg | Freq: Once | INTRAMUSCULAR | Status: DC | PRN

## 2019-08-05 MED ORDER — LOPERAMIDE HCL 2 MG PO CAPS
2.0000 mg | ORAL_CAPSULE | Freq: Four times a day (QID) | ORAL | Status: DC | PRN
  Administered 2019-08-05 – 2019-08-08 (×6): 2 mg via ORAL
  Filled 2019-08-05 (×6): qty 1

## 2019-08-05 MED ORDER — SODIUM CHLORIDE 0.9 % IV SOLN: 100.0000 mL | INTRAVENOUS | Status: DC | PRN

## 2019-08-05 MED ORDER — HYDRALAZINE HCL 10 MG PO TABS
10.0000 mg | ORAL_TABLET | Freq: Three times a day (TID) | ORAL | Status: DC | PRN
  Administered 2019-08-05: 10 mg via ORAL
  Filled 2019-08-05 (×2): qty 1

## 2019-08-05 MED ORDER — HEPARIN SODIUM (PORCINE) 1000 UNIT/ML IJ SOLN
3200.0000 [IU] | Freq: Once | INTRAMUSCULAR | Status: AC
  Administered 2019-08-05: 3200 [IU] via INTRAVENOUS
  Filled 2019-08-05: qty 3.2

## 2019-08-05 NOTE — Progress Notes (Signed)
PROGRESS NOTE    [name]  KNL:[id number] DOB: 11/13/1935 DOA: 08/02/2019 PCP: Colon Branch, MD   Brief Narrative: 83 year old with past medical history significant for depression, breast cancer status post left mastectomy/chemotherapy radiation, anemia Crohn's ileitis, CAD and CKD stage IV who presents to the emergency department for evaluation of progressive left arm swelling and bilateral lower extremity edema.  Patient reports occasional left arm swelling since her left mastectomy 20 years ago, but this has been much worse in recent weeks, also report redness.  She was treated as an outpatient with 2 courses of antibiotics without significant improvement.  She denies hematochezia melena.  Evaluation in the ED patient was found to be afebrile, blood pressure stable.  Radiograph of the left forearm and left wrist demonstrate soft tissue swelling of the forearm subcutaneous edema no bony abnormality and suggestive of cellulitis.  Patient was found to have acute on chronic worsening renal function creatinine at 6, bicarb at 15, BUN 108.  CBC hemoglobin 7.3.\  Patient has been admitted for further management of acute on chronic renal failure left arm cellulitis that failed oral antibiotics. Patient hemoglobin dropped further to 6.8.  She reports black stool, which she is taking oral iron.  She denies hematochezia.   Assessment & Plan:   Principal Problem:   Acute renal failure superimposed on stage 4 chronic kidney disease (HCC) Active Problems:   Diabetes mellitus type 2 with complications (HCC)   Macrocytic anemia   Essential hypertension   Cellulitis of arm, left   Acidosis   1-Acute kidney injuries superimposed on chronic kidney disease a stage IV Metabolic acidosis hyperkalemia: -Patient presented with a BUN 108 and creatinine at 6.  Earlier this month creatinine 3.7 -Nephrology has been consulted and is recommending to start patient on hemodialysis. -Radiology has been  consulted for end of tunnel hemodialysis catheter placement. -Per nephrology patient might require renal biopsy during this hospitalization. -Plan to repeat hemodialysis today.  2-Left arm cellulitis: Cellulitis superimposed on lymphedema. Continue with IV ceftriaxone.  3 antibiotic Continue with elevation of the arm. [place] negative for DVT.  Improved.   3-Anemia: This could be anemia of chronic kidney disease. No active bleeding.  She report black stool but she is taking oral iron. We will check occult blood but these might be positive with her history of Crohn's ileitis. Received 2 units of packed red blood cell. I have started PPI twice daily. Monitor hemoglobin and monitor for melena. GI consulted.  Further recommendation.  No further recommendation.  4-DM; Continue with a sliding scale insulin.  5-Hypertension: Continue with metoprolol and Norvasc 6-CAD: Continue with the statins and beta-blocker. 7-Crohn's disease: Continue with prednisone.  For diarrhea today will resume home dose Imodium as needed Hypocalcemia; corrected by albumin. Corrected calcium 8.1  Estimated body mass index is 50 kg/m as calculated from the following:   Height as of this encounter: 5' 2"  (1.575 m).   Weight as of this encounter: 124 kg.   DVT prophylaxis: SCDs due to anemia. Code Status: DNR Family Communication: Care discussed with patient, decline that I contact family Disposition Plan: Remain in the hospital for further treatment of AKI, anemia.  Left upper extremity cellulitis requiring IV antibiotics.  Blood transfusion. Consultants:   Nephrology  Procedures:   Hemodialysis tunnel catheter  Antimicrobials:  Ceftriaxone 10-23  Subjective: He is complaining of diarrhea daily feeling her Crohn's is acting.  Usually taking Imodium at home.  Will resume Imodium.  Objective: Vitals:  08/05/19 1100 08/05/19 1130 08/05/19 1200 08/05/19 1230  BP: (!) 168/78 (!) 172/72 (!) 166/71 (!)  163/70  Pulse: 63 67 62 66  Resp:      Temp:      TempSrc:      SpO2:      Weight:      Height:        Intake/Output Summary (Last 24 hours) at 08/05/2019 1238 Last data filed at 08/05/2019 0830 Gross per 24 hour  Intake 645 ml  Output --  Net 645 ml   Filed Weights   08/04/19 0400 08/05/19 0600 08/05/19 1015  Weight: 57.7 kg 125.6 kg 124 kg    Examination:  General exam: NAD Respiratory system: Clear to auscultation Cardiovascular system: S1, S2 regular rhythm and rate Gastrointestinal system: Bowel sounds present, soft nontender nondistended Central nervous system: Nonfocal Extremities: Symmetric power Skin; left upper extremity redness and edema improved   Data Reviewed: I have personally reviewed following labs and imaging studies  CBC: Recent Labs  Lab 08/02/19 2149 08/03/19 0328 08/03/19 0504 08/03/19 2155 08/04/19 0754 08/05/19 1039 08/05/19 1115  WBC 5.0 5.7  --   --  4.7 6.8 5.5  NEUTROABS 4.2 4.4  --   --   --   --   --   HGB 7.3* 6.2* 6.7* 9.3* 9.5* 9.7* 10.4*  HCT 22.6* 18.5* 19.9* 26.4* 26.9* 28.2* 30.2*  MCV 102.3* 96.9  --   --  90.6 93.7 93.5  PLT 322 274  --   --  172 177 196*   Basic Metabolic Panel: Recent Labs  Lab 08/02/19 2149 08/03/19 0328 08/04/19 0754 08/05/19 0225 08/05/19 1038 08/05/19 1115  NA 135 134* 139  --  139 134*  K 5.1 5.1 4.0  --  3.5 3.0*  CL 101 101 105  --  102 97*  CO2 15* 16* 22  --  22 26  GLUCOSE 207* 150* 91  --  116* 104*  BUN 108* 113* 58*  --  75* <5*  CREATININE 6.02* 5.92* 3.92*  --  4.85* 0.43*  CALCIUM 6.5* 6.1* 6.3*  --  6.1* 7.4*  PHOS  --   --   --  5.6* 5.1*  --    GFR: Estimated Creatinine Clearance: 70.6 mL/min (A) (by C-G formula based on SCr of 0.43 mg/dL (L)). Liver Function Tests: Recent Labs  Lab 08/02/19 2149 08/04/19 0754 08/05/19 1038  AST 20 14*  --   ALT 26 17  --   ALKPHOS 51 38  --   BILITOT 0.4 0.2*  --   PROT 6.0* 4.4*  --   ALBUMIN 2.6* 1.8* 1.9*   No results  for input(s): LIPASE, AMYLASE in the last 168 hours. No results for input(s): AMMONIA in the last 168 hours. Coagulation Profile: Recent Labs  Lab 08/03/19 2155  INR 1.0   Cardiac Enzymes: No results for input(s): CKTOTAL, CKMB, CKMBINDEX, TROPONINI in the last 168 hours. BNP (last 3 results) No results for input(s): PROBNP in the last 8760 hours. HbA1C: No results for input(s): HGBA1C in the last 72 hours. CBG: Recent Labs  Lab 08/04/19 2022 08/05/19 0018 08/05/19 0352 08/05/19 0854 08/05/19 1204  GLUCAP 151* 135* 103* 108* 122*   Lipid Profile: No results for input(s): CHOL, HDL, LDLCALC, TRIG, CHOLHDL, LDLDIRECT in the last 72 hours. Thyroid Function Tests: No results for input(s): TSH, T4TOTAL, FREET4, T3FREE, THYROIDAB in the last 72 hours. Anemia Panel: No results for input(s): VITAMINB12, FOLATE, FERRITIN, TIBC,  IRON, RETICCTPCT in the last 72 hours. Sepsis Labs: Recent Labs  Lab 08/02/19 2149 08/03/19 0328  LATICACIDVEN 2.3*   4.3* 1.2    Recent Results (from the past 240 hour(s))  Blood culture (routine x 2)     Status: None (Preliminary result)   Collection Time: 08/02/19  9:49 PM   Specimen: BLOOD  Result Value Ref Range Status   Specimen Description   Final    BLOOD RIGHT ANTECUBITAL Performed at [facility] [address] [place], [place] [address]    Special Requests   Final    BOTTLES DRAWN AEROBIC AND ANAEROBIC Blood Culture adequate volume Performed at [facility] [address] [place], [place] [address]    Culture   Final    NO GROWTH 1 DAY Performed at [facility], [place] [address], [place], [place] [address]    Report Status PENDING  Incomplete  Blood culture (routine x 2)     Status: None (Preliminary result)   Collection Time: 08/02/19  9:49 PM   Specimen: BLOOD  Result Value Ref Range Status   Specimen Description   Final    BLOOD RIGHT ANTECUBITAL Performed at [facility] [address] [place], [place] [address]    Special Requests   Final    BOTTLES DRAWN AEROBIC AND ANAEROBIC Blood Culture results may not be optimal due to an inadequate volume of blood received in culture bottles Performed at [facility] [address], [place], [place] [address]    Culture   Final    NO GROWTH 1 DAY Performed at [facility], [place] [address], [place], [place] [address]    Report Status PENDING  Incomplete  SARS CORONAVIRUS 2 (TAT 6-24 HRS) Nasopharyngeal Nasal Mucosa     Status: None   Collection Time: 08/03/19  2:40 AM   Specimen: Nasal Mucosa; Nasopharyngeal  Result Value Ref Range Status   SARS Coronavirus 2 NEGATIVE NEGATIVE Final    Comment: (NOTE) SARS-CoV-2 target nucleic acids are NOT DETECTED. The SARS-CoV-2 RNA is generally detectable in upper and lower respiratory specimens during the acute phase of infection. Negative results do not preclude SARS-CoV-2 infection, do not rule out co-infections with other pathogens, and should not be used as the sole basis for treatment or other patient management decisions. Negative results must be combined with clinical observations, patient history, and epidemiological information. The expected result is Negative. Fact Sheet for Patients: SugarRoll.be Fact Sheet for Healthcare Providers: https://www.woods-mathews.com/ This test is not yet approved or cleared by the [place] FDA and  has been authorized for detection and/or diagnosis of SARS-CoV-2 by FDA under an Emergency Use Authorization (EUA). This EUA will remain  in effect (meaning this test can be used) for the duration of the COVID-19 declaration under Section 56 4(b)(1) of the Act, 21 U.S.C. section 360bbb-3(b)(1), unless the authorization is terminated or revoked sooner. Performed at [facility], [place] [address], [place],  [address]   MRSA PCR Screening      Status: None   Collection Time: 08/03/19  2:40 AM  Result Value Ref Range Status   MRSA by PCR NEGATIVE NEGATIVE Final    Comment:        The GeneXpert MRSA Assay (FDA approved for NASAL specimens only), is one component of a comprehensive MRSA colonization surveillance program. It is not intended to diagnose MRSA infection nor to guide or monitor treatment for MRSA  infections. Performed at [facility], [place] [address] [place], [place] [address]          Radiology Studies: Ir Fluoro Guide Cv Line Right  Result Date: 08/03/2019 INDICATION: End-stage renal disease. Please perform tunnel dialysis catheter placement for the initiation of dialysis. EXAM: TUNNELED CENTRAL VENOUS HEMODIALYSIS CATHETER PLACEMENT WITH ULTRASOUND AND FLUOROSCOPIC GUIDANCE MEDICATIONS: Ancef 2 gm IV . The antibiotic was given in an appropriate time interval prior to skin puncture. ANESTHESIA/SEDATION: Versed 1 mg IV; Fentanyl 50 mcg IV; Moderate Sedation Time:  12 minutes The patient was continuously monitored during the procedure by the interventional radiology nurse under my direct supervision. FLUOROSCOPY TIME:  24 seconds (2 mGy) COMPLICATIONS: None immediate. PROCEDURE: Informed written consent was obtained from the patient after a discussion of the risks, benefits, and alternatives to treatment. Questions regarding the procedure were encouraged and answered. The right neck and chest were prepped with chlorhexidine in a sterile fashion, and a sterile drape was applied covering the operative field. Maximum barrier sterile technique with sterile gowns and gloves were used for the procedure. A timeout was performed prior to the initiation of the procedure. After creating a small venotomy incision, a micropuncture kit was utilized to access the internal jugular vein. Real-time ultrasound guidance was utilized for vascular access including the acquisition of a permanent ultrasound image documenting patency of  the accessed vessel. The microwire was utilized to measure appropriate catheter length. A stiff Glidewire was advanced to the level of the IVC and the micropuncture sheath was exchanged for a peel-away sheath. A palindrome tunneled hemodialysis catheter measuring 19 cm from tip to cuff was tunneled in a retrograde fashion from the anterior chest wall to the venotomy incision. The catheter was then placed through the peel-away sheath with tips ultimately positioned within the superior aspect of the right atrium. Final catheter positioning was confirmed and documented with a spot radiographic image. The catheter aspirates and flushes normally. The catheter was flushed with appropriate volume heparin dwells. The catheter exit site was secured with a 0-Prolene retention suture. The venotomy incision was closed with Dermabond and Steri-strips. Dressings were applied. The patient tolerated the procedure well without immediate post procedural complication. IMPRESSION: Successful placement of 19 cm tip to cuff tunneled hemodialysis catheter via the right internal jugular vein with tips terminating within the superior aspect of the right atrium. The catheter is ready for immediate use. Electronically Signed   By: Sandi Mariscal M.D.   On: 08/03/2019 16:31   Ir US Guide Vasc Access Right  Result Date: 08/03/2019 INDICATION: End-stage renal disease. Please perform tunnel dialysis catheter placement for the initiation of dialysis. EXAM: TUNNELED CENTRAL VENOUS HEMODIALYSIS CATHETER PLACEMENT WITH ULTRASOUND AND FLUOROSCOPIC GUIDANCE MEDICATIONS: Ancef 2 gm IV . The antibiotic was given in an appropriate time interval prior to skin puncture. ANESTHESIA/SEDATION: Versed 1 mg IV; Fentanyl 50 mcg IV; Moderate Sedation Time:  12 minutes The patient was continuously monitored during the procedure by the interventional radiology nurse under my direct supervision. FLUOROSCOPY TIME:  24 seconds (2 mGy) COMPLICATIONS: None immediate.  PROCEDURE: Informed written consent was obtained from the patient after a discussion of the risks, benefits, and alternatives to treatment. Questions regarding the procedure were encouraged and answered. The right neck and chest were prepped with chlorhexidine in a sterile fashion, and a sterile drape was applied covering the operative field. Maximum barrier sterile technique with sterile gowns and gloves were used for the procedure. A timeout was performed prior to  the initiation of the procedure. After creating a small venotomy incision, a micropuncture kit was utilized to access the internal jugular vein. Real-time ultrasound guidance was utilized for vascular access including the acquisition of a permanent ultrasound image documenting patency of the accessed vessel. The microwire was utilized to measure appropriate catheter length. A stiff Glidewire was advanced to the level of the IVC and the micropuncture sheath was exchanged for a peel-away sheath. A palindrome tunneled hemodialysis catheter measuring 19 cm from tip to cuff was tunneled in a retrograde fashion from the anterior chest wall to the venotomy incision. The catheter was then placed through the peel-away sheath with tips ultimately positioned within the superior aspect of the right atrium. Final catheter positioning was confirmed and documented with a spot radiographic image. The catheter aspirates and flushes normally. The catheter was flushed with appropriate volume heparin dwells. The catheter exit site was secured with a 0-Prolene retention suture. The venotomy incision was closed with Dermabond and Steri-strips. Dressings were applied. The patient tolerated the procedure well without immediate post procedural complication. IMPRESSION: Successful placement of 19 cm tip to cuff tunneled hemodialysis catheter via the right internal jugular vein with tips terminating within the superior aspect of the right atrium. The catheter is ready for  immediate use. Electronically Signed   By: Sandi Mariscal M.D.   On: 08/03/2019 16:31   Vas [place] Upper Extremity Venous Duplex  Result Date: 08/04/2019 UPPER VENOUS STUDY  Indications: Swelling Comparison Study: No prior study. Performing Technologist: Maudry Mayhew MHA, RDMS, RVT, RDCS  Examination Guidelines: A complete evaluation includes B-mode imaging, spectral Doppler, color Doppler, and power Doppler as needed of all accessible portions of each vessel. Bilateral testing is considered an integral part of a complete examination. Limited examinations for reoccurring indications may be performed as noted.  Right Findings: +----------+------------+---------+-----------+----------+---------------------+  RIGHT      Compressible Phasicity Spontaneous Properties        Summary         +----------+------------+---------+-----------+----------+---------------------+  Subclavian                                                Unable to visualize                                                               due to bandaging     +----------+------------+---------+-----------+----------+---------------------+  Left Findings: +----------+------------+---------+-----------+----------+-------+  LEFT       Compressible Phasicity Spontaneous Properties Summary  +----------+------------+---------+-----------+----------+-------+  IJV            Full        Yes        Yes                         +----------+------------+---------+-----------+----------+-------+  Subclavian     Full        Yes        Yes                         +----------+------------+---------+-----------+----------+-------+  Axillary       Full  Yes        Yes                         +----------+------------+---------+-----------+----------+-------+  Brachial       Full        Yes        Yes                         +----------+------------+---------+-----------+----------+-------+  Radial         Full                                                +----------+------------+---------+-----------+----------+-------+  Cephalic       Full                                               +----------+------------+---------+-----------+----------+-------+ Unable to visualize left basilic or ulnar veins.  Summary:  Left: No evidence of deep vein thrombosis in the upper extremity. No evidence of superficial vein thrombosis in the upper extremity. This was a limited study.  *See table(s) above for measurements and observations.  Diagnosing physician: Monica Martinez MD Electronically signed by Monica Martinez MD on 08/04/2019 at 10:42:49 AM.    Final         Scheduled Meds:  amLODipine  10 mg Oral Daily   atorvastatin  20 mg Oral Daily   calcitRIOL  0.25 mcg Oral Daily   Chlorhexidine Gluconate Cloth  6 each Topical Q0600   heparin  3,200 Units Intravenous Once   insulin aspart  0-9 Units Subcutaneous TID WC   metoprolol tartrate  25 mg Oral BID   pantoprazole  40 mg Oral BID   predniSONE  40 mg Oral Q breakfast   sodium chloride flush  3 mL Intravenous Q12H   sodium chloride flush  3 mL Intravenous Q12H   Continuous Infusions:  sodium chloride     sodium chloride     sodium chloride     cefTRIAXone (ROCEPHIN)  IV 1 g (08/04/19 2357)     LOS: 3 days    Time spent: 35 minutes     Elmarie Shiley, MD [facility] Pager [phone]  If 7PM-7AM, please contact night-coverage [email or web address] Password [facility] 08/05/2019, 12:38 PM

## 2019-08-05 NOTE — Progress Notes (Signed)
[facility]    NEPHROLOGY PROGRESS NOTE  SUBJECTIVE: Patient seen and examined earlier today.  Denies chest pain, shortness of breath, nausea, vomiting, diarrhea or dysuria.  Overall feeling well.  Second dialysis treatment today.  All other review of systems are negative.   OBJECTIVE:  Vitals:   08/05/19 1312 08/05/19 1438  BP: (!) 167/59 (!) 142/67  Pulse: 67 84  Resp:  18  Temp:  98.4 F (36.9 C)  SpO2:  99%    Intake/Output Summary (Last 24 hours) at 08/05/2019 1508 Last data filed at 08/05/2019 1245 Gross per 24 hour  Intake 325 ml  Output 476 ml  Net -151 ml      General:  AAOx3 NAD HEENT: MMM [place] AT anicteric sclera Neck:  No JVD, no adenopathy CV:  Heart RRR  Lungs:  L/S CTA bilaterally Abd:  abd SNT/ND with normal BS GU:  Bladder non-palpable Extremities:  No LE edema. Skin:  No skin rash  MEDICATIONS:  . amLODipine  10 mg Oral Daily  . atorvastatin  20 mg Oral Daily  . calcitRIOL  0.25 mcg Oral Daily  . Chlorhexidine Gluconate Cloth  6 each Topical Q0600  . insulin aspart  0-9 Units Subcutaneous TID WC  . metoprolol tartrate  25 mg Oral BID  . pantoprazole  40 mg Oral BID  . predniSONE  40 mg Oral Q breakfast  . sodium chloride flush  3 mL Intravenous Q12H  . sodium chloride flush  3 mL Intravenous Q12H       LABS:   CBC Latest Ref Rng & Units 08/05/2019 08/05/2019 08/04/2019  WBC 4.0 - 10.5 K/uL 5.5 6.8 4.7  Hemoglobin 12.0 - 15.0 g/dL 10.4(L) 9.7(L) 9.5(L)  Hematocrit 36.0 - 46.0 % 30.2(L) 28.2(L) 26.9(L)  Platelets 150 - 400 K/uL 132(L) 177 172    CMP Latest Ref Rng & Units 08/05/2019 08/05/2019 08/04/2019  Glucose 70 - 99 mg/dL 104(H) 116(H) 91  BUN 8 - 23 mg/dL <5(L) 75(H) 58(H)  Creatinine 0.44 - 1.00 mg/dL 0.43(L) 4.85(H) 3.92(H)  Sodium 135 - 145 mmol/L 134(L) 139 139  Potassium 3.5 - 5.1 mmol/L 3.0(L) 3.5 4.0  Chloride 98 - 111 mmol/L 97(L) 102 105  CO2 22 - 32 mmol/L 26 22 22   Calcium 8.9 - 10.3 mg/dL 7.4(L)  6.1(LL) 6.3(LL)  Total Protein 6.5 - 8.1 g/dL - - 4.4(L)  Total Bilirubin 0.3 - 1.2 mg/dL - - 0.2(L)  Alkaline Phos 38 - 126 U/L - - 38  AST 15 - 41 U/L - - 14(L)  ALT 0 - 44 U/L - - 17    Lab Results  Component Value Date   CALCIUM 7.4 (L) 08/05/2019   CAION 3.61 (L) 07/23/2019   PHOS 5.1 (H) 08/05/2019       Component Value Date/Time   COLORURINE YELLOW 08/02/2019 [place] 08/02/2019 2254   LABSPEC 1.010 08/02/2019 2254   PHURINE 5.0 08/02/2019 2254   GLUCOSEU 50 (A) 08/02/2019 [place] 08/02/2019 2254   BILIRUBINUR NEGATIVE 08/02/2019 [place] 08/02/2019 2254   PROTEINUR NEGATIVE 08/02/2019 2254   UROBILINOGEN 0.2 12/29/2014 1400   NITRITE NEGATIVE 08/02/2019 2254   LEUKOCYTESUR NEGATIVE 08/02/2019 2254      Component Value Date/Time   TCO2 20 (L) 05/30/2019 0654       Component Value Date/Time   IRON 57 07/23/2019 0955   TIBC 168 (L) 04/07/2019 2157   FERRITIN 298.5 (H) 07/23/2019 4403  IRONPCTSAT 10 (L) 04/07/2019 2157       ASSESSMENT/PLAN:    1.  Acute kidney injury on chronic kidney disease stage IV: Appears oliguric at this time without any acute electrolyte abnormality-mild metabolic acidosis and hyperkalemia noted.  Possible differential diagnosis include acute interstitial nephritis, ATN and very less likely infection associated GN.    Urinalysis is bland, arguing against interstitial nephritis and glomerulonephritis.  Dialysis started Friday for uremia.  We will plan a third treatment on Monday.    Will need renal biopsy next week for diagnosis/prognosis of her acute kidney injury but would like to be sure hemoglobin is stable for another day or so to ordering.  She is currently on prednisone for her inflammatory bowel disease/Crohn's.  No evidence of obstruction on ultrasound.  2.  Volume overload: Should improve with dialysis.  3.  Anion gap metabolic acidosis: Secondary to acute kidney injury and mild lactic  acidosis, improving with dialysis.   4.  Cellulitis of left upper extremity: Superimposed on chronic lymphedema following ipsilateral mastectomy/lymph node dissection.  On ceftriaxone and status post vancomycin-monitor cautiously for vancomycin toxicity.  5.  Anemia:  Not clearly related to iron deficiency.  Monitor for signs of bleeding.  6.  Hypertension: Monitor on current antihypertensive therapy/ultrafiltration with HD.  7.  Metabolic bone disease.  Check intact PTH.  Phos stable at 5.1.  Calcium on the low side.  We will continue calcitriol.    [place], DO, [place]

## 2019-08-05 NOTE — Procedures (Signed)
I evaluated the patient while on hemodialysis.  I reviewed the patient's treatment plan.  Patient tolerating dialysis well.  Discussed with RN.

## 2019-08-05 NOTE — Progress Notes (Signed)
CRITICAL VALUE ALERT  Critical Value: Ca 6.1  Date & Time Notied:  1121  Provider Notified: Dr. Tyrell Antonio  Orders Received/Actions taken: MD notified. No new orders at this time.

## 2019-08-05 NOTE — Evaluation (Signed)
Occupational Therapy Evaluation Patient Details Name: Samantha Mckenzie MRN: [id number] DOB: [birth year] Today's Date: 08/05/2019    History of Present Illness 83 y.o. female admitted on 08/03/19 for L UE and bil LE swelling.  L UE negative for DVT suspected cellulitis superimposed on L UE lymphedema from L mastectomy.  Pt also found to be in acute renal failure on chronic stage IV kidney disease.  HD catheter placed for her to start dialysis (new).  Pt also with metabolic acidosis, hyperkalemia, anemia (could be of chronic kidney disease, but also found to have heme (+) stool), s/p 2 units PRBCs, GI consulted (h/o Crohn's).  Pt with other significant PMH of HTN, DM, CAD, gout, breast CA.   Clinical Impression   Pt PTA: living with spouse and independent with ADL. Pt currently SupervisionA level to Modified independence. pt limited mostly by LUE lymphedema- but pt reports no pain and able to use for ADL. Pt able to state general mastectomy precautions. Pt ambulating well with RW and no LOB noted. Pt ambulatory in room. Pt does not require continued OT skilled services, but could benefit from [facility] for safety and ADL routine in home setting. OT signing off.     Follow Up Recommendations  Home health OT;Supervision - Intermittent    Equipment Recommendations  None recommended by OT    Recommendations for Other Services       Precautions / Restrictions Precautions Precautions: Fall Restrictions Weight Bearing Restrictions: No      Mobility Bed Mobility Overal bed mobility: Modified Independent                Transfers Overall transfer level: Needs assistance Equipment used: Rolling walker (2 wheeled) Transfers: Sit to/from [employer] Transfers Sit to Stand: Modified independent (Device/Increase time) Stand pivot transfers: Modified independent (Device/Increase time)            Balance Overall balance assessment: Needs assistance Sitting-balance support: Feet  supported;No upper extremity supported Sitting balance-Leahy Scale: Good     Standing balance support: Bilateral upper extremity supported Standing balance-Leahy Scale: Fair Standing balance comment: RW for support                           ADL either performed or assessed with clinical judgement   ADL Overall ADL's : At baseline                                       General ADL Comments: Pt SupervisionA level to Modified independence. pt limited mostly by LUE lymphedema- bu tpt repotrs no pain and able to use for ADL.     Vision Baseline Vision/History: No visual deficits Vision Assessment?: No apparent visual deficits     Perception     Praxis      Pertinent Vitals/Pain Pain Assessment: No/denies pain     Hand Dominance Right   Extremity/Trunk Assessment Upper Extremity Assessment Upper Extremity Assessment: LUE deficits/detail;Generalized weakness(L) RUE Deficits / Details: WFLs, ROM LUE Deficits / Details: AROM, WFLs. Swelling due to lymphedema LUE Coordination: decreased gross motor;decreased fine motor   Lower Extremity Assessment Lower Extremity Assessment: Generalized weakness   Cervical / Trunk Assessment Cervical / Trunk Assessment: Normal   Communication Communication Communication: No difficulties(Speaks Spanish too)   Cognition Arousal/Alertness: Awake/alert Behavior During Therapy: WFL for tasks assessed/performed Overall Cognitive Status: Within Functional Limits for tasks assessed  General Comments       Exercises     Shoulder Instructions      Home Living Family/patient expects to be discharged to:: Private residence Living Arrangements: Spouse/significant other Available Help at Discharge: Family;Available 24 hours/day Type of Home: House Home Access: Level entry     Home Layout: One level     Bathroom Shower/Tub: Occupational psychologist:  Standard     Home Equipment: Environmental consultant - 2 wheels;Walker - 4 wheels   Additional Comments: husband can only provide supervision      Prior Functioning/Environment Level of Independence: Independent with assistive device(s)        Comments: uses rollator at home        OT Problem List: Decreased activity tolerance;Impaired UE functional use      OT Treatment/Interventions:      OT Goals(Current goals can be found in the care plan section) Acute Rehab OT Goals Patient Stated Goal: to get back to walking for exercise OT Goal Formulation: With patient Time For Goal Achievement: 08/19/19 Potential to Achieve Goals: Good  OT Frequency:     Barriers to D/C:            Co-evaluation              AM-PAC OT "6 Clicks" Daily Activity     Outcome Measure Help from another person eating meals?: None Help from another person taking care of personal grooming?: None Help from another person toileting, which includes using toliet, bedpan, or urinal?: None Help from another person bathing (including washing, rinsing, drying)?: A Little Help from another person to put on and taking off regular upper body clothing?: None Help from another person to put on and taking off regular lower body clothing?: A Little 6 Click Score: 22   End of Session Equipment Utilized During Treatment: Gait belt Nurse Communication: Mobility status  Activity Tolerance: Patient tolerated treatment well Patient left: in chair;with call bell/phone within reach;with chair alarm set  OT Visit Diagnosis: Unsteadiness on feet (R26.81);Muscle weakness (generalized) (M62.81)                Time: 5170-0174 OT Time Calculation (min): 15 min Charges:  OT General Charges $OT Visit: 1 Visit OT Evaluation $OT Eval Moderate Complexity: 1 Mod  Samantha Mckenzie) Samantha Mckenzie OTR/L Acute Rehabilitation Services Pager: [phone] Office: [phone]   Samantha Mckenzie 08/05/2019, 10:11 AM

## 2019-08-05 NOTE — Progress Notes (Signed)
Pt. Dialyzer clotted with 1 hr and 15 min remaining treatment time. Dr. Maryjane Hurter made aware ok to rinse pt. Back. Pt. stable

## 2019-08-06 ENCOUNTER — Inpatient Hospital Stay (HOSPITAL_COMMUNITY)
Admission: RE | Admit: 2019-08-06 | Discharge: 2019-08-06 | Disposition: A | Discharge status: Home / Self Care | Payer: Medicare Other | Source: Ambulatory Visit | Attending: Nephrology | Admitting: Nephrology

## 2019-08-06 ENCOUNTER — Encounter (HOSPITAL_COMMUNITY): Payer: Self-pay

## 2019-08-06 LAB — RENAL FUNCTION PANEL
Albumin: 1.8 g/dL — ABNORMAL LOW (ref 3.5–5.0)
Anion gap: 10 (ref 5–15)
BUN: 54 mg/dL — ABNORMAL HIGH (ref 8–23)
CO2: 25 mmol/L (ref 22–32)
Calcium: 6.4 mg/dL — CL (ref 8.9–10.3)
Chloride: 104 mmol/L (ref 98–111)
Creatinine, Ser: 4.17 mg/dL — ABNORMAL HIGH (ref 0.44–1.00)
GFR calc Af Amer: 11 mL/min — ABNORMAL LOW (ref >60)
GFR calc non Af Amer: 9 mL/min — ABNORMAL LOW (ref >60)
Glucose, Bld: 103 mg/dL — ABNORMAL HIGH (ref 70–99)
Phosphorus: 4.7 mg/dL — ABNORMAL HIGH (ref 2.5–4.6)
Potassium: 3.8 mmol/L (ref 3.5–5.1)
Sodium: 139 mmol/L (ref 135–145)

## 2019-08-06 LAB — GLUCOSE, CAPILLARY
Glucose-Capillary: 171 mg/dL — ABNORMAL HIGH (ref 70–99)
Glucose-Capillary: 74 mg/dL (ref 70–99)
Glucose-Capillary: 173 mg/dL — ABNORMAL HIGH (ref 70–99)
Glucose-Capillary: 105 mg/dL — ABNORMAL HIGH (ref 70–99)

## 2019-08-06 LAB — VITAMIN D 25 HYDROXY (VIT D DEFICIENCY, FRACTURES): Vit D, 25-Hydroxy: 38.25 ng/mL (ref 30–100)

## 2019-08-06 LAB — PROTEIN ELECTROPHORESIS, SERUM
A/G Ratio: 1.1 (ref 0.7–1.7)
Albumin ELP: 2.5 g/dL — ABNORMAL LOW (ref 2.9–4.4)
Alpha-1-Globulin: 0.2 g/dL (ref 0.0–0.4)
Alpha-2-Globulin: 0.8 g/dL (ref 0.4–1.0)
Beta Globulin: 0.6 g/dL — ABNORMAL LOW (ref 0.7–1.3)
Gamma Globulin: 0.6 g/dL (ref 0.4–1.8)
Globulin, Total: 2.2 g/dL (ref 2.2–3.9)
Total Protein ELP: 4.7 g/dL — ABNORMAL LOW (ref 6.0–8.5)

## 2019-08-06 LAB — CBC
HCT: 27.3 % — ABNORMAL LOW (ref 36.0–46.0)
Hemoglobin: 9.1 g/dL — ABNORMAL LOW (ref 12.0–15.0)
MCH: 31.4 pg (ref 26.0–34.0)
MCHC: 33.3 g/dL (ref 30.0–36.0)
MCV: 94.1 fL (ref 80.0–100.0)
Platelets: 129 10*3/uL — ABNORMAL LOW (ref 150–400)
RBC: 2.9 MIL/uL — ABNORMAL LOW (ref 3.87–5.11)
RDW: 17.5 % — ABNORMAL HIGH (ref 11.5–15.5)
WBC: 7.1 10*3/uL (ref 4.0–10.5)
nRBC: 0 % (ref 0.0–0.2)

## 2019-08-06 LAB — KAPPA/LAMBDA LIGHT CHAINS
Kappa free light chain: 93.3 mg/L — ABNORMAL HIGH (ref 3.3–19.4)
Kappa, lambda light chain ratio: 1.96 — ABNORMAL HIGH (ref 0.26–1.65)
Lambda free light chains: 47.7 mg/L — ABNORMAL HIGH (ref 5.7–26.3)

## 2019-08-06 LAB — ANCA TITERS
Atypical P-ANCA titer: <1:20 {titer}
C-ANCA: <1:20 {titer}
P-ANCA: <1:20 {titer}

## 2019-08-06 LAB — ANA W/REFLEX IF POSITIVE: Anti Nuclear Antibody (ANA): NEGATIVE

## 2019-08-06 MED ORDER — HYDRALAZINE HCL 20 MG/ML IJ SOLN
INTRAMUSCULAR | Status: AC
  Administered 2019-08-06: 20 mg
  Filled 2019-08-06: qty 1

## 2019-08-06 MED ORDER — SODIUM CHLORIDE 0.9 % IV SOLN: 100.0000 mL | INTRAVENOUS | Status: DC | PRN

## 2019-08-06 MED ORDER — CALCITRIOL 0.25 MCG PO CAPS
ORAL_CAPSULE | ORAL | Status: AC
  Administered 2019-08-06: 10:00:00 via ORAL
  Filled 2019-08-06: qty 1

## 2019-08-06 MED ORDER — HEPARIN SODIUM (PORCINE) 1000 UNIT/ML IJ SOLN
INTRAMUSCULAR | Status: AC
  Administered 2019-08-06: 10:00:00 via INTRAVENOUS_CENTRAL
  Filled 2019-08-06: qty 4

## 2019-08-06 MED ORDER — HEPARIN SODIUM (PORCINE) 1000 UNIT/ML DIALYSIS: 1000.0000 [IU] | INTRAMUSCULAR | Status: DC | PRN

## 2019-08-06 MED ORDER — CARVEDILOL 6.25 MG PO TABS
6.2500 mg | ORAL_TABLET | Freq: Two times a day (BID) | ORAL | Status: DC
  Administered 2019-08-06 – 2019-08-10 (×8): 6.25 mg via ORAL
  Filled 2019-08-06 (×8): qty 1

## 2019-08-06 MED ORDER — HYDRALAZINE HCL 10 MG PO TABS
10.0000 mg | ORAL_TABLET | Freq: Three times a day (TID) | ORAL | Status: DC
  Administered 2019-08-06 – 2019-08-10 (×12): 10 mg via ORAL
  Filled 2019-08-06 (×12): qty 1

## 2019-08-06 NOTE — Procedures (Signed)
I evaluated the patient while on hemodialysis.  I reviewed the patient's treatment plan.  Patient tolerating dialysis well.  Discussed with RN.

## 2019-08-06 NOTE — Consult Note (Signed)
Chief Complaint: Patient was seen in consultation today for random renal biopsy Chief Complaint  Patient presents with   Arm Swelling   Foot Swelling   at the request of Dr Maryjane Hurter  Supervising Physician: Arne Cleveland  Patient Status: [name] - In-pt  History of Present Illness: Samantha Mckenzie is a 83 y.o. female   Acute kidney injury on chronic kidney disease stage IV Oliguric at this time Possible acute interstitial nephritis; ATN; infection associated Glomerulonephritis ESRD- currently Rt tunneled catheter - for HD (placed in IR 10/23)  Scheduled for random renal biopsy per Nephrology Plan for 10/27    Past Medical History:  Diagnosis Date   Allergic rhinitis    Anemia    Breast CA ([place])    surgery, chemo, XRT; had peripheral neuropathy (imbalance at times) after chemo   Cellulitis    Left arm, recurrent    Cellulitis LEFT arm recurrent 12/08/2011   Chronic renal disease, stage IV ([place])    Dr. Moshe Cipro   Depression    Fatigue    GERD (gastroesophageal reflux disease)    gastritis, EGD 02/2007   Gout    Hyperlipidemia    Hypertension    Macular degeneration 2011   Normal cardiac stress test 12/2014   Osteoarthritis    Renal insufficiency     Past Surgical History:  Procedure Laterality Date   BIOPSY  06/06/2019   Procedure: BIOPSY;  Surgeon: Ladene Artist, MD;  Location: [facility];  Service: Endoscopy;;   cataracts bilaterally  1 -2012   COLONOSCOPY WITH PROPOFOL N/A 06/06/2019   Procedure: COLONOSCOPY WITH PROPOFOL;  Surgeon: Ladene Artist, MD;  Location: [facility];  Service: Endoscopy;  Laterality: N/A;   IR FLUORO GUIDE CV LINE RIGHT  08/03/2019   IR US GUIDE VASC ACCESS RIGHT  08/03/2019   LUNG BIOPSY  1999   neg   MASTECTOMY Left 1999   and lymphnodes     Allergies: Codeine, Morphine, Oxycodone-acetaminophen, Sulfonamide derivatives, and Tramadol hcl  Medications: Prior to Admission medications     Medication Sig Start Date End Date Taking? Authorizing Provider  acetaminophen (TYLENOL) 325 MG tablet Take 2 tablets (650 mg total) by mouth every 6 (six) hours as needed for mild pain, fever or headache (or Fever >/= 101). 04/12/19  Yes Emokpae, Courage, MD  allopurinol (ZYLOPRIM) 100 MG tablet Take 1 tablet (100 mg total) by mouth daily. 06/29/19  Yes Lassen, Arlo C, PA-C  amLODipine (NORVASC) 10 MG tablet Take 1 tablet (10 mg total) by mouth daily. 07/24/19  Yes Paz, Alda Berthold, MD  atorvastatin (LIPITOR) 20 MG tablet Take 1 tablet (20 mg total) by mouth daily. 06/29/19  Yes Lassen, Arlo C, PA-C  Calcium Carb-Cholecalciferol (CALTRATE 600+D3 PO) Take 1 tablet by mouth 2 (two) times daily.    Yes [provider]  Cholecalciferol (VITAMIN D3) 1000 UNITS CAPS Take 1 capsule by mouth daily.    Yes [provider]  dicyclomine (BENTYL) 10 MG capsule Take 2 capsules (20 mg total) by mouth 4 (four) times daily -  before meals and at bedtime. 06/29/19  Yes Lassen, Arlo C, PA-C  doxycycline (VIBRA-TABS) 100 MG tablet Take 1 tablet (100 mg total) by mouth 2 (two) times daily. 07/26/19  Yes Paz, Alda Berthold, MD  dronabinol (MARINOL) 2.5 MG capsule Take 1 capsule (2.5 mg total) by mouth 2 (two) times daily before a meal. 07/24/19  Yes Paz, Alda Berthold, MD  escitalopram (LEXAPRO) 10 MG tablet Take 2  tablets (20 mg total) by mouth daily. 07/06/19  Yes Colon Branch, MD  ferrous sulfate 325 (65 FE) MG tablet Take 1 tablet (325 mg total) by mouth daily with breakfast. 06/29/19 08/03/19 Yes Lassen, Arlo C, PA-C  loperamide (IMODIUM A-D) 2 MG tablet Take 2 mg by mouth 4 (four) times daily as needed for diarrhea or loose stools.   Yes [provider]  metoprolol tartrate (LOPRESSOR) 25 MG tablet Take 1 tablet (25 mg total) by mouth 2 (two) times daily. 06/29/19  Yes Lassen, Arlo C, PA-C  mirtazapine (REMERON) 15 MG tablet Take 1 tablet (15 mg total) by mouth at bedtime. 07/24/19  Yes Paz, Alda Berthold, MD  Multiple  Vitamins-Minerals (CENTRUM SILVER PO) Take 1 tablet by mouth daily.    Yes [provider]  ondansetron (ZOFRAN) 4 MG tablet Take 1 tablet (4 mg total) by mouth 4 (four) times daily. 07/24/19  Yes Paz, Alda Berthold, MD  pantoprazole (PROTONIX) 40 MG tablet Take 1 tablet (40 mg total) by mouth daily before breakfast. 07/24/19  Yes Paz, Alda Berthold, MD  predniSONE (DELTASONE) 10 MG tablet Take 1 tablets a day (19m) for one month; then take 1.5 tablets a day (351m for 2 weeks; then take 1 tablet a day (2022muntil further notice 07/12/19  Yes PerIrene ShipperD  vitamin B-12 (CYANOCOBALAMIN) 1000 MCG tablet Take 1,000 mcg by mouth daily.     Yes [provider]  vitamin C (ASCORBIC ACID) 500 MG tablet Take 500 mg by mouth daily.   Yes [provider]  predniSONE (DELTASONE) 20 MG tablet Prednisone 40 mg daily during the month of October 2.  Prednisone 30 mg daily from November 1 through November 15 3.  Prednisone 20 mg daily from November 16 until further notice 07/23/19   PerIrene ShipperD     Family History  Problem Relation Age of Onset   Breast cancer Other        ? of    Heart attack Father 86 99Diabetes Sister        2   Hypertension Sister    Colon cancer Neg Hx    Stomach cancer Neg Hx     Social History   Socioeconomic History   Marital status: Married    Spouse name: Not on file   Number of children: 2   Years of education: Not on file   Highest education level: Not on file  Occupational History   Occupation: retired     EmpFish farm managerETIRED  SocScientist, product/process developmentrain: Not on file   Food insecurity    Worry: Not on file    Inability: Not on file   Transportation needs    Medical: No    Non-medical: No  Tobacco Use   Smoking status: Never Smoker   Smokeless tobacco: Never Used  Substance and Sexual Activity   Alcohol use: Not Currently    Alcohol/week: 0.0 standard drinks    Comment: rarely   Drug use: No   Sexual  activity: Not Currently  Lifestyle   Physical activity    Days per week: Not on file    Minutes per session: Not on file   Stress: Not on file  Relationships   Social connections    Talks on phone: Not on file    Gets together: Not on file    Attends religious service: Not on file    Active member of club or  organization: Not on file    Attends meetings of clubs or organizations: Not on file    Relationship status: Not on file  Other Topics Concern   Not on file  Social History Narrative   Lives w/ husband , 2 children in [place], 2 [place]             Review of Systems: A 12 point ROS discussed and pertinent positives are indicated in the HPI above.  All other systems are negative.  Review of Systems  Constitutional: Positive for activity change, appetite change and fatigue. Negative for fever.  Respiratory: Positive for shortness of breath. Negative for cough.   Cardiovascular: Negative for chest pain.  Gastrointestinal: Negative for abdominal pain.  Neurological: Positive for weakness.  Psychiatric/Behavioral: Negative for behavioral problems and confusion.    Vital Signs: BP 125/65    Pulse 75    Temp (!) 97.5 F (36.4 C) (Oral)    Resp 18    Ht _0  (1.575 m)    Wt 120 lb 13 oz (54.8 kg)    SpO2 98%    BMI 22.10 kg/m   Physical Exam Vitals signs reviewed.  Cardiovascular:     Rate and Rhythm: Normal rate and regular rhythm.     Heart sounds: Normal heart sounds.  Pulmonary:     Breath sounds: Wheezing present.  Abdominal:     Palpations: Abdomen is soft.  Musculoskeletal: Normal range of motion.  Skin:    General: Skin is warm and dry.  Neurological:     Mental Status: She is alert and oriented to person, place, and time.  Psychiatric:        Mood and Affect: Mood normal.        Behavior: Behavior normal.        Thought Content: Thought content normal.        Judgment: Judgment normal.     Imaging: Dg Forearm Left  Result Date: 08/02/2019 CLINICAL  DATA:  Left arm cellulitis. EXAM: LEFT FOREARM - 2 VIEW COMPARISON:  None. FINDINGS: There is no evidence of fracture or other focal bone lesions. No bone erosions identified. There is diffuse soft tissue swelling identified. IMPRESSION: 1. No acute bone abnormality. 2. Diffuse soft tissue swelling. Electronically Signed   By: Kerby Moors M.D.   On: 08/02/2019 21:31   US Renal  Result Date: 08/03/2019 CLINICAL DATA:  Initial evaluation for acute renal failure. EXAM: RENAL / URINARY TRACT ULTRASOUND COMPLETE COMPARISON:  Prior CT from 05/30/2019. FINDINGS: Right Kidney: Renal measurements: 10.7 x 5.8 x 5.6 cm = volume: 180 mL. Diffusely increased echogenicity within the renal parenchyma, compatible with medical renal disease. No nephrolithiasis or hydronephrosis. 2.2 x 1.7 x 1.9 cm simple cyst present at the upper pole. Left Kidney: Renal measurements: 8.3 x 4.8 x 4.1 cm = volume: 85.2 mL. Diffusely increased echogenicity within the renal parenchyma, compatible with medical renal disease. No nephrolithiasis or hydronephrosis. No focal renal mass. Bladder: Appears normal for degree of bladder distention. Other: None. IMPRESSION: 1. Diffusely increased echogenicity within the renal parenchyma, compatible with medical renal disease. No hydronephrosis. 2. 2.2 cm simple right renal cyst. Electronically Signed   By: Jeannine Boga M.D.   On: 08/03/2019 01:08   Ir Fluoro Guide Cv Line Right  Result Date: 08/03/2019 INDICATION: End-stage renal disease. Please perform tunnel dialysis catheter placement for the initiation of dialysis. EXAM: TUNNELED CENTRAL VENOUS HEMODIALYSIS CATHETER PLACEMENT WITH ULTRASOUND AND FLUOROSCOPIC GUIDANCE MEDICATIONS: Ancef 2 gm IV .  The antibiotic was given in an appropriate time interval prior to skin puncture. ANESTHESIA/SEDATION: Versed 1 mg IV; Fentanyl 50 mcg IV; Moderate Sedation Time:  12 minutes The patient was continuously monitored during the procedure by the  interventional radiology nurse under my direct supervision. FLUOROSCOPY TIME:  24 seconds (2 mGy) COMPLICATIONS: None immediate. PROCEDURE: Informed written consent was obtained from the patient after a discussion of the risks, benefits, and alternatives to treatment. Questions regarding the procedure were encouraged and answered. The right neck and chest were prepped with chlorhexidine in a sterile fashion, and a sterile drape was applied covering the operative field. Maximum barrier sterile technique with sterile gowns and gloves were used for the procedure. A timeout was performed prior to the initiation of the procedure. After creating a small venotomy incision, a micropuncture kit was utilized to access the internal jugular vein. Real-time ultrasound guidance was utilized for vascular access including the acquisition of a permanent ultrasound image documenting patency of the accessed vessel. The microwire was utilized to measure appropriate catheter length. A stiff Glidewire was advanced to the level of the IVC and the micropuncture sheath was exchanged for a peel-away sheath. A palindrome tunneled hemodialysis catheter measuring 19 cm from tip to cuff was tunneled in a retrograde fashion from the anterior chest wall to the venotomy incision. The catheter was then placed through the peel-away sheath with tips ultimately positioned within the superior aspect of the right atrium. Final catheter positioning was confirmed and documented with a spot radiographic image. The catheter aspirates and flushes normally. The catheter was flushed with appropriate volume heparin dwells. The catheter exit site was secured with a 0-Prolene retention suture. The venotomy incision was closed with Dermabond and Steri-strips. Dressings were applied. The patient tolerated the procedure well without immediate post procedural complication. IMPRESSION: Successful placement of 19 cm tip to cuff tunneled hemodialysis catheter via the  right internal jugular vein with tips terminating within the superior aspect of the right atrium. The catheter is ready for immediate use. Electronically Signed   By: Sandi Mariscal M.D.   On: 08/03/2019 16:31   Ir US Guide Vasc Access Right  Result Date: 08/03/2019 INDICATION: End-stage renal disease. Please perform tunnel dialysis catheter placement for the initiation of dialysis. EXAM: TUNNELED CENTRAL VENOUS HEMODIALYSIS CATHETER PLACEMENT WITH ULTRASOUND AND FLUOROSCOPIC GUIDANCE MEDICATIONS: Ancef 2 gm IV . The antibiotic was given in an appropriate time interval prior to skin puncture. ANESTHESIA/SEDATION: Versed 1 mg IV; Fentanyl 50 mcg IV; Moderate Sedation Time:  12 minutes The patient was continuously monitored during the procedure by the interventional radiology nurse under my direct supervision. FLUOROSCOPY TIME:  24 seconds (2 mGy) COMPLICATIONS: None immediate. PROCEDURE: Informed written consent was obtained from the patient after a discussion of the risks, benefits, and alternatives to treatment. Questions regarding the procedure were encouraged and answered. The right neck and chest were prepped with chlorhexidine in a sterile fashion, and a sterile drape was applied covering the operative field. Maximum barrier sterile technique with sterile gowns and gloves were used for the procedure. A timeout was performed prior to the initiation of the procedure. After creating a small venotomy incision, a micropuncture kit was utilized to access the internal jugular vein. Real-time ultrasound guidance was utilized for vascular access including the acquisition of a permanent ultrasound image documenting patency of the accessed vessel. The microwire was utilized to measure appropriate catheter length. A stiff Glidewire was advanced to the level of the IVC and the micropuncture sheath  was exchanged for a peel-away sheath. A palindrome tunneled hemodialysis catheter measuring 19 cm from tip to cuff was  tunneled in a retrograde fashion from the anterior chest wall to the venotomy incision. The catheter was then placed through the peel-away sheath with tips ultimately positioned within the superior aspect of the right atrium. Final catheter positioning was confirmed and documented with a spot radiographic image. The catheter aspirates and flushes normally. The catheter was flushed with appropriate volume heparin dwells. The catheter exit site was secured with a 0-Prolene retention suture. The venotomy incision was closed with Dermabond and Steri-strips. Dressings were applied. The patient tolerated the procedure well without immediate post procedural complication. IMPRESSION: Successful placement of 19 cm tip to cuff tunneled hemodialysis catheter via the right internal jugular vein with tips terminating within the superior aspect of the right atrium. The catheter is ready for immediate use. Electronically Signed   By: Sandi Mariscal M.D.   On: 08/03/2019 16:31   Dg Hand Complete Left  Result Date: 08/02/2019 CLINICAL DATA:  Left arm cellulitis EXAM: LEFT HAND - COMPLETE 3+ VIEW COMPARISON:  None. FINDINGS: There is no evidence of fracture or dislocation. 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 Diffuse dorsal soft tissue edema is noted. No subcutaneous emphysema seen. IMPRESSION: Diffuse dorsal subcutaneous edema surrounding the wrist and hand, likely consistent with cellulitis. Electronically Signed   By: Prudencio Pair M.D.   On: 08/02/2019 21:33   Vas [place] Upper Extremity Venous Duplex  Result Date: 08/04/2019 UPPER VENOUS STUDY  Indications: Swelling Comparison Study: No prior study. Performing Technologist: Maudry Mayhew MHA, RDMS, RVT, RDCS  Examination Guidelines: A complete evaluation includes B-mode imaging, spectral Doppler, color Doppler, and power Doppler as needed of all accessible portions of each vessel. Bilateral testing is considered an  integral part of a complete examination. Limited examinations for reoccurring indications may be performed as noted.  Right Findings: +----------+------------+---------+-----------+----------+---------------------+  RIGHT      Compressible Phasicity Spontaneous Properties        Summary         +----------+------------+---------+-----------+----------+---------------------+  Subclavian                                                Unable to visualize                                                               due to bandaging     +----------+------------+---------+-----------+----------+---------------------+  Left Findings: +----------+------------+---------+-----------+----------+-------+  LEFT       Compressible Phasicity Spontaneous Properties Summary  +----------+------------+---------+-----------+----------+-------+  IJV            Full        Yes        Yes                         +----------+------------+---------+-----------+----------+-------+  Subclavian     Full        Yes        Yes                         +----------+------------+---------+-----------+----------+-------+  Axillary       Full        Yes        Yes                         +----------+------------+---------+-----------+----------+-------+  Brachial       Full        Yes        Yes                         +----------+------------+---------+-----------+----------+-------+  Radial         Full                                               +----------+------------+---------+-----------+----------+-------+  Cephalic       Full                                               +----------+------------+---------+-----------+----------+-------+ Unable to visualize left basilic or ulnar veins.  Summary:  Left: No evidence of deep vein thrombosis in the upper extremity. No evidence of superficial vein thrombosis in the upper extremity. This was a limited study.  *See table(s) above for measurements and observations.  Diagnosing physician:  Monica Martinez MD Electronically signed by Monica Martinez MD on 08/04/2019 at 10:42:49 AM.    Final     Labs:  CBC: Recent Labs    08/04/19 0754 08/05/19 1039 08/05/19 1115 08/06/19 0719  WBC 4.7 6.8 5.5 7.1  HGB 9.5* 9.7* 10.4* 9.1*  HCT 26.9* 28.2* 30.2* 27.3*  PLT 172 177 132* 129*    COAGS: Recent Labs    04/07/19 2157 05/16/19 1500 05/30/19 0616 08/03/19 2155  INR 1.1 1.1 1.0 1.0  APTT _0 --     BMP: Recent Labs    08/04/19 0754 08/05/19 1038 08/05/19 1115 08/06/19 0719  NA 139 139 134* 139  K 4.0 3.5 3.0* 3.8  CL 105 102 97* 104  CO2 _1 GLUCOSE 91 116* 104* 103*  BUN 58* 75* <5* 54*  CALCIUM 6.3* 6.1* 7.4* 6.4*  CREATININE 3.92* 4.85* 0.43* 4.17*  GFRNONAA 10* 8* >60 9*  GFRAA 12* 9* >60 11*    LIVER FUNCTION TESTS: Recent Labs    06/08/19 0513 06/09/19 0831 08/02/19 2149 08/04/19 0754 08/05/19 1038 08/06/19 0719  BILITOT 0.3 0.4 0.4 0.2*  --   --   AST 13* 15 20 14*  --   --   ALT _2 --   --   ALKPHOS 61 63 51 38  --   --   PROT 5.8* 6.4* 6.0* 4.4*  --   --   ALBUMIN 2.3* 2.5* 2.6* 1.8* 1.9* 1.8*    TUMOR MARKERS: No results for input(s): AFPTM, CEA, CA199, CHROMGRNA in the last 8760 hours.  Assessment and Plan:  Acute renal injury/ Chronic renal disease stage IV Currently HD-- Rt IJ tunneled catheter Scheduled for random renal biopsy per Nephrology  Plan for 08/07/19 in IR Risks and benefits of random renal bx was discussed with the patient and/or patient's family including, but not limited to bleeding, infection, damage to adjacent structures or  low yield requiring additional tests.  All of the questions were answered and there is agreement to proceed.  Consent signed and in chart.  Thank you for this interesting consult.  I greatly enjoyed meeting HAIDE KLINKER and look forward to participating in their care.  A copy of this report was sent to the requesting provider on this  date.  Electronically Signed: Lavonia Drafts, PA-C 08/06/2019, 2:22 PM   I spent a total of 20 Minutes    in face to face in clinical consultation, greater than 50% of which was counseling/coordinating care for random renal bx

## 2019-08-06 NOTE — Progress Notes (Addendum)
COMMUNITY PALLIATIVE CARE RN NOTE  PATIENT NAME: Samantha Mckenzie DOB: 07-Nov-1935 MRN: [id number]  PRIMARY CARE PROVIDER: Colon Branch, MD  RESPONSIBLE PARTY:  Acct ID - Guarantor Home Phone Work Phone Relationship Acct Type  [id number] JENAH, [phone]  Self P/F     [address], [place], [place] [address]   Covid-19 Pre-screening Negative  PLAN OF CARE and INTERVENTION:  1. ADVANCE CARE PLANNING/GOALS OF CARE: Goal is for patient to remain in her home. She has a DNR. 2. PATIENT/CAREGIVER EDUCATION: Safe Mobility and Medication Management 3. DISEASE STATUS: Joint visit made with Palliative Care SW, Lynn Duffy. Met with patient in her home. She is awake and alert, but reports feeling tired all the time. She went out to go shopping over this past week, but could not complete the task d/t fatigue. She does not want to utilize the motorized cart because she is afraid of not being able to drive it. She continues with edema in her left arm. Her son bought her a new compression sleeve but she says it is too small. She has an upcoming appointment this week with her PCP to assess her arm. She is currently on Doxycycline and has 4 more doses left. She had a visit with her Nephrologist last week who gave new orders. Norvasc decreased to 1/2 tab (5 mg) daily from 10 mg. Also new orders given to increase Lasix to 65m in the am and 453min the pm. I contacted Dr. GoDonato Heinzffice to advise that patient does not have Lasix in the home. Awaiting return call. Filled patient's pill box. Will continue to monitor.   HISTORY OF PRESENT ILLNESS:  This is a [age]o female who resides at home with her husband. Palliative care team continues to follow patient. Next visit scheduled in 1 week.   CODE STATUS: DNR  ADVANCED DIRECTIVES: Y MOST FORM: yes PPS: 50%   (Duration of visit and documentation 75 minutes)   MoDaryl EasternRN BSN

## 2019-08-06 NOTE — Consult Note (Addendum)
   [facility] CM Inpatient Consult   08/06/2019  Samantha Mckenzie Feb 18, 1936 [id number]   Patient screened for extreme high risk score for unplanned readmission score of 62% and currently with multiple hospitalizations 5 admissions in the past 6 months.    Primary Care Provider is Dr. Kathlene November with [facility] and this office provides the transition of care follow up.   Patient was previously outreached by [facility] Coordinator and decline services as she is adamant that she does not want [facility] Care Management.  She is currently with [facility] noted.   Plan:  Sign off, as patient has declined services and active with [facility] Palliative services.   For additional questions or referrals please contact:  Natividad Brood, RN BSN [facility] Liaison  [phone] business mobile phone Toll free office [phone]  Fax number: [phone] [name]

## 2019-08-06 NOTE — Progress Notes (Signed)
Pt. bp 182/92. Dr. Maryjane Hurter made aware ok to give scheduled dose amlodipine now.

## 2019-08-06 NOTE — Progress Notes (Signed)
PROGRESS NOTE    Samantha Mckenzie  NFA:[id number] DOB: [birth year] DOA: 08/02/2019 PCP: Colon Branch, MD   Brief Narrative: 83 year old with past medical history significant for depression, breast cancer status post left mastectomy/chemotherapy radiation, anemia Crohn's ileitis, CAD and CKD stage IV who presents to the emergency department for evaluation of progressive left arm swelling and bilateral lower extremity edema.  Patient reports occasional left arm swelling since her left mastectomy 20 years ago, but this has been much worse in recent weeks, also report redness.  She was treated as an outpatient with 2 courses of antibiotics without significant improvement.  She denies hematochezia melena.  Evaluation in the ED patient was found to be afebrile, blood pressure stable.  Radiograph of the left forearm and left wrist demonstrate soft tissue swelling of the forearm subcutaneous edema no bony abnormality and suggestive of cellulitis.  Patient was found to have acute on chronic worsening renal function creatinine at 6, bicarb at 15, BUN 108.  CBC hemoglobin 7.3.\\  Patient has been admitted for further management of acute on chronic renal failure left arm cellulitis that failed oral antibiotics. Patient hemoglobin dropped further to 6.8.  She reports black stool, which she is taking oral iron.  She denies hematochezia.   Assessment & Plan:   Principal Problem:   Acute renal failure superimposed on stage 4 chronic kidney disease (HCC) Active Problems:   Diabetes mellitus type 2 with complications (HCC)   Macrocytic anemia   Essential hypertension   Cellulitis of arm, left   Acidosis   1-Acute kidney injuries superimposed on chronic kidney disease a stage IV Metabolic acidosis hyperkalemia: -Patient presented with a BUN 108 and creatinine at 6.  Earlier this month creatinine 3.7 -Nephrology has been consulted and is recommending to start patient on hemodialysis. -Patient underwent  right IJ tunneled  hemodialysis catheter placement on 10-23.  -Per nephrology patient might require renal biopsy during this hospitalization. -Getting HD today.  -IR consulted for  kidney biopsy, plan for biopsy on 10-27. -Vascular consulted, for evaluation for AV fistula or graft.  2-Left arm cellulitis: Cellulitis superimposed on lymphedema. Continue with IV ceftriaxone.  4 antibiotic Continue with elevation of the arm. [place] negative for DVT.  Still with significant edema.   3-Anemia: This could be anemia of chronic kidney disease. No active bleeding.  She report black stool but she is taking oral iron. We will check occult blood but these might be positive with her history of Crohn's ileitis. Received 2 units of packed red blood cell. I have started PPI twice daily. Monitor hemoglobin and monitor for melena. GI consulted.  Further recommendation.  No further recommendation. Hb has remain stable.   4-DM; Continue with a sliding scale insulin.  5-Hypertension: Continue with metoprolol and Norvasc 6-CAD: Continue with the statins and beta-blocker. 7-Crohn's disease: Continue with prednisone.  For diarrhea today will resume home dose Imodium as needed Hypocalcemia; corrected by albumin. Corrected calcium 8.1  Estimated body mass index is 22.1 kg/m as calculated from the following:   Height as of this encounter: 5' 2"  (1.575 m).   Weight as of this encounter: 54.8 kg.   DVT prophylaxis: SCDs due to anemia. Code Status: DNR Family Communication: Care discussed with patient, decline that I contact family Disposition Plan: Remain in the hospital for further treatment of AKI, anemia.  Left upper extremity cellulitis requiring IV antibiotics.  Blood transfusion. Consultants:   Nephrology  Procedures:   Hemodialysis tunnel catheter  Antimicrobials:  Ceftriaxone 10-23  Subjective: She report diarrhea has improved with Imodium as needed.  Left arm swelling continues to be very  bad.  Redness has improved.  Objective: Vitals:   08/06/19 1030 08/06/19 1040 08/06/19 1045 08/06/19 1245  BP: (!) 191/87 (!) 214/88 (!) 176/73 125/65  Pulse: 74 78 92 75  Resp:   18   Temp:   (!) 97.5 F (36.4 C)   TempSrc:   Oral   SpO2:   98%   Weight:   54.8 kg   Height:        Intake/Output Summary (Last 24 hours) at 08/06/2019 1449 Last data filed at 08/06/2019 1228 Gross per 24 hour  Intake 343 ml  Output 2002 ml  Net -1659 ml   Filed Weights   08/05/19 1245 08/06/19 0700 08/06/19 1045  Weight: 124 kg 56.9 kg 54.8 kg    Examination:  General exam: NAD Respiratory system: CTA Cardiovascular system: S 1, S 2 RRR Gastrointestinal system: BS present, soft,nt Central nervous system: Non focal.  Extremities:Symmetrci power Skin; Left upper arm swelling persist, redness improved   Data Reviewed: I have personally reviewed following labs and imaging studies  CBC: Recent Labs  Lab 08/02/19 2149 08/03/19 0328  08/03/19 2155 08/04/19 0754 08/05/19 1039 08/05/19 1115 08/06/19 0719  WBC 5.0 5.7  --   --  4.7 6.8 5.5 7.1  NEUTROABS 4.2 4.4  --   --   --   --   --   --   HGB 7.3* 6.2*   < > 9.3* 9.5* 9.7* 10.4* 9.1*  HCT 22.6* 18.5*   < > 26.4* 26.9* 28.2* 30.2* 27.3*  MCV 102.3* 96.9  --   --  90.6 93.7 93.5 94.1  PLT 322 274  --   --  172 177 132* 129*   < > = values in this interval not displayed.   Basic Metabolic Panel: Recent Labs  Lab 08/03/19 0328 08/04/19 0754 08/05/19 0225 08/05/19 1038 08/05/19 1115 08/06/19 0719  NA 134* 139  --  139 134* 139  K 5.1 4.0  --  3.5 3.0* 3.8  CL 101 105  --  102 97* 104  CO2 16* 22  --  22 26 25   GLUCOSE 150* 91  --  116* 104* 103*  BUN 113* 58*  --  75* <5* 54*  CREATININE 5.92* 3.92*  --  4.85* 0.43* 4.17*  CALCIUM 6.1* 6.3*  --  6.1* 7.4* 6.4*  PHOS  --   --  5.6* 5.1*  --  4.7*   GFR: Estimated Creatinine Clearance: 8.5 mL/min (A) (by C-G formula based on SCr of 4.17 mg/dL (H)). Liver Function Tests:  Recent Labs  Lab 08/02/19 2149 08/04/19 0754 08/05/19 1038 08/06/19 0719  AST 20 14*  --   --   ALT 26 17  --   --   ALKPHOS 51 38  --   --   BILITOT 0.4 0.2*  --   --   PROT 6.0* 4.4*  --   --   ALBUMIN 2.6* 1.8* 1.9* 1.8*   No results for input(s): LIPASE, AMYLASE in the last 168 hours. No results for input(s): AMMONIA in the last 168 hours. Coagulation Profile: Recent Labs  Lab 08/03/19 2155  INR 1.0   Cardiac Enzymes: No results for input(s): CKTOTAL, CKMB, CKMBINDEX, TROPONINI in the last 168 hours. BNP (last 3 results) No results for input(s): PROBNP in the last 8760 hours. HbA1C: No results for input(s): HGBA1C in the last  72 hours. CBG: Recent Labs  Lab 08/05/19 1204 08/05/19 1638 08/05/19 1901 08/05/19 2234 08/06/19 1120  GLUCAP 122* 256* 192* 112* 74   Lipid Profile: No results for input(s): CHOL, HDL, LDLCALC, TRIG, CHOLHDL, LDLDIRECT in the last 72 hours. Thyroid Function Tests: No results for input(s): TSH, T4TOTAL, FREET4, T3FREE, THYROIDAB in the last 72 hours. Anemia Panel: No results for input(s): VITAMINB12, FOLATE, FERRITIN, TIBC, IRON, RETICCTPCT in the last 72 hours. Sepsis Labs: Recent Labs  Lab 08/02/19 2149 08/03/19 0328  LATICACIDVEN 2.3*  4.3* 1.2    Recent Results (from the past 240 hour(s))  Blood culture (routine x 2)     Status: None (Preliminary result)   Collection Time: 08/02/19  9:49 PM   Specimen: BLOOD  Result Value Ref Range Status   Specimen Description   Final    BLOOD RIGHT ANTECUBITAL Performed at [facility] [address], [place], [place] [address]    Special Requests   Final    BOTTLES DRAWN AEROBIC AND ANAEROBIC Blood Culture adequate volume Performed at [facility] [address], [place], [place] [address]    Culture   Final    NO GROWTH 3 DAYS Performed at [facility], [place] [address] [place], [place] [address]    Report Status PENDING  Incomplete   Blood culture (routine x 2)     Status: None (Preliminary result)   Collection Time: 08/02/19  9:49 PM   Specimen: BLOOD  Result Value Ref Range Status   Specimen Description   Final    BLOOD RIGHT ANTECUBITAL Performed at [facility] [address], [place], [place] [address]    Special Requests   Final    BOTTLES DRAWN AEROBIC AND ANAEROBIC Blood Culture results may not be optimal due to an inadequate volume of blood received in culture bottles Performed at [facility] [address], [place], [place] [address]    Culture   Final    NO GROWTH 3 DAYS Performed at [facility], [place] [address], [place], [place] [address]    Report Status PENDING  Incomplete  SARS CORONAVIRUS 2 (TAT 6-24 HRS) Nasopharyngeal Nasal Mucosa     Status: None   Collection Time: 08/03/19  2:40 AM   Specimen: Nasal Mucosa; Nasopharyngeal  Result Value Ref Range Status   SARS Coronavirus 2 NEGATIVE NEGATIVE Final    Comment: (NOTE) SARS-CoV-2 target nucleic acids are NOT DETECTED. The SARS-CoV-2 RNA is generally detectable in upper and lower respiratory specimens during the acute phase of infection. Negative results do not preclude SARS-CoV-2 infection, do not rule out co-infections with other pathogens, and should not be used as the sole basis for treatment or other patient management decisions. Negative results must be combined with clinical observations, patient history, and epidemiological information. The expected result is Negative. Fact Sheet for Patients: SugarRoll.be Fact Sheet for Healthcare Providers: https://www.woods-mathews.com/ This test is not yet approved or cleared by the [place] FDA and  has been authorized for detection and/or diagnosis of SARS-CoV-2 by FDA under an Emergency Use Authorization (EUA). This EUA will remain  in effect (meaning this test can be used) for the duration of the  COVID-19 declaration under Section 56 4(b)(1) of the Act, 21 U.S.C. section 360bbb-3(b)(1), unless the authorization is terminated or revoked sooner. Performed at [facility], [place] [address], [place],  [address]   MRSA PCR Screening     Status: None  Collection Time: 08/03/19  2:40 AM  Result Value Ref Range Status   MRSA by PCR NEGATIVE NEGATIVE Final    Comment:        The GeneXpert MRSA Assay (FDA approved for NASAL specimens only), is one component of a comprehensive MRSA colonization surveillance program. It is not intended to diagnose MRSA infection nor to guide or monitor treatment for MRSA infections. Performed at [facility], [place] [address] [place], [place] [address]          Radiology Studies: No results found.      Scheduled Meds: . amLODipine  10 mg Oral Daily  . atorvastatin  20 mg Oral Daily  . calcitRIOL  0.25 mcg Oral Daily  . carvedilol  6.25 mg Oral BID WC  . Chlorhexidine Gluconate Cloth  6 each Topical Q0600  . hydrALAZINE  10 mg Oral Q8H  . insulin aspart  0-9 Units Subcutaneous TID WC  . pantoprazole  40 mg Oral BID  . predniSONE  40 mg Oral Q breakfast  . sodium chloride flush  3 mL Intravenous Q12H  . sodium chloride flush  3 mL Intravenous Q12H   Continuous Infusions: . sodium chloride    . cefTRIAXone (ROCEPHIN)  IV 1 g (08/05/19 2155)     LOS: 4 days    Time spent: 35 minutes     Elmarie Shiley, MD [facility] Pager [phone]  If 7PM-7AM, please contact night-coverage [email or web address] Password [email or web address] 08/06/2019, 2:49 PM

## 2019-08-06 NOTE — Addendum Note (Signed)
Addended by: Conan Bowens on: 08/06/2019 02:33 PM   Modules accepted: Level of Service

## 2019-08-06 NOTE — Progress Notes (Addendum)
[facility] NOTE   MRN : [id number]  Reason for Consult: ESRD Referring Physician: Nephrology  History of Present Illness: 83 y/o female ESRD has Right TDC and currently on HD.  We have been asked to provide permanent access.    Past medical history includes:  hypertension, coronary artery disease, gout, dyslipidemia, history of breast cancer status post mastectomy, radiation/chemotherapy (20 years ago) with lymphedema of left upper.       Current Facility-Administered Medications  Medication Dose Route Frequency Provider Last Rate Last Dose  . 0.9 %  sodium chloride infusion  250 mL Intravenous PRN Opyd, Ilene Qua, MD      . 0.9 %  sodium chloride infusion  100 mL Intravenous PRN Finnigan, Nancy A, DO      . 0.9 %  sodium chloride infusion  100 mL Intravenous PRN Finnigan, Nancy A, DO      . 0.9 %  sodium chloride infusion  100 mL Intravenous PRN Finnigan, Nancy A, DO      . 0.9 %  sodium chloride infusion  100 mL Intravenous PRN Finnigan, Nancy A, DO      . acetaminophen (TYLENOL) tablet 650 mg  650 mg Oral Q6H PRN Opyd, Ilene Qua, MD       Or  . acetaminophen (TYLENOL) suppository 650 mg  650 mg Rectal Q6H PRN Opyd, Ilene Qua, MD      . alteplase (CATHFLO ACTIVASE) injection 2 mg  2 mg Intracatheter Once PRN Finnigan, Nancy A, DO      . amLODipine (NORVASC) tablet 10 mg  10 mg Oral Daily Opyd, Ilene Qua, MD   10 mg at 08/05/19 0935  . atorvastatin (LIPITOR) tablet 20 mg  20 mg Oral Daily Opyd, Ilene Qua, MD   20 mg at 08/05/19 0935  . calcitRIOL (ROCALTROL) capsule 0.25 mcg  0.25 mcg Oral Daily Finnigan, Nancy A, DO   0.25 mcg at 08/05/19 0935  . cefTRIAXone (ROCEPHIN) 1 g in sodium chloride 0.9 % 100 mL IVPB  1 g Intravenous Q24H Opyd, Ilene Qua, MD 200 mL/hr at 08/05/19 2155 1 g at 08/05/19 2155  . Chlorhexidine Gluconate Cloth 2 % PADS 6 each  6 each Topical Q0600 Elmarie Shiley, MD   6 each at 08/06/19 0600  . heparin injection 1,000 Units  1,000  Units Dialysis PRN Finnigan, Nancy A, DO      . heparin injection 1,000 Units  1,000 Units Dialysis PRN Finnigan, Nancy A, DO      . heparin injection 1,000 Units  1,000 Units Dialysis PRN Finnigan, Nancy A, DO      . hydrALAZINE (APRESOLINE) tablet 10 mg  10 mg Oral Q8H PRN Regalado, Belkys A, MD   10 mg at 08/05/19 1333  . insulin aspart (novoLOG) injection 0-9 Units  0-9 Units Subcutaneous TID WC Blount, Lolita Cram, NP   2 Units at 08/05/19 1925  . loperamide (IMODIUM) capsule 2 mg  2 mg Oral QID PRN Regalado, Belkys A, MD   2 mg at 08/05/19 0944  . metoprolol tartrate (LOPRESSOR) tablet 25 mg  25 mg Oral BID Vianne Bulls, MD   25 mg at 08/05/19 2151  . ondansetron (ZOFRAN) tablet 4 mg  4 mg Oral Q6H PRN Opyd, Ilene Qua, MD       Or  . ondansetron (ZOFRAN) injection 4 mg  4 mg Intravenous Q6H PRN Opyd, Ilene Qua, MD      . pantoprazole (PROTONIX) EC tablet  40 mg  40 mg Oral BID Alvira Philips, [place]   40 mg at 08/05/19 2150  . predniSONE (DELTASONE) tablet 40 mg  40 mg Oral Q breakfast Opyd, Ilene Qua, MD   40 mg at 08/05/19 0943  . sodium chloride flush (NS) 0.9 % injection 3 mL  3 mL Intravenous Q12H Opyd, Ilene Qua, MD   3 mL at 08/04/19 0255  . sodium chloride flush (NS) 0.9 % injection 3 mL  3 mL Intravenous Q12H Opyd, Ilene Qua, MD   3 mL at 08/05/19 2158  . sodium chloride flush (NS) 0.9 % injection 3 mL  3 mL Intravenous PRN Opyd, Ilene Qua, MD        Pt meds include: Statin :Yes Betablocker: Yes ASA: No Other anticoagulants/antiplatelets: none  Past Medical History:  Diagnosis Date  . Allergic rhinitis   . Anemia   . Breast CA ([facility])    surgery, chemo, XRT; had peripheral neuropathy (imbalance at times) after chemo  . Cellulitis    Left arm, recurrent   . Cellulitis LEFT arm recurrent 12/08/2011  . Chronic renal disease, stage IV (HCC)    Dr. Moshe Cipro  . Depression   . Fatigue   . GERD (gastroesophageal reflux disease)    gastritis, EGD 02/2007  . Gout   .  Hyperlipidemia   . Hypertension   . Macular degeneration 2011  . Normal cardiac stress test 12/2014  . Osteoarthritis   . Renal insufficiency     Past Surgical History:  Procedure Laterality Date  . BIOPSY  06/06/2019   Procedure: BIOPSY;  Surgeon: Ladene Artist, MD;  Location: [facility];  Service: Endoscopy;;  . cataracts bilaterally  1 -2012  . COLONOSCOPY WITH PROPOFOL N/A 06/06/2019   Procedure: COLONOSCOPY WITH PROPOFOL;  Surgeon: Ladene Artist, MD;  Location: [facility];  Service: Endoscopy;  Laterality: N/A;  . IR FLUORO GUIDE CV LINE RIGHT  08/03/2019  . IR US GUIDE VASC ACCESS RIGHT  08/03/2019  . LUNG BIOPSY  1999   neg  . MASTECTOMY Left 1999   and lymphnodes     Social History Social History   Tobacco Use  . Smoking status: Never Smoker  . Smokeless tobacco: Never Used  Substance Use Topics  . Alcohol use: Not Currently    Alcohol/week: 0.0 standard drinks    Comment: rarely  . Drug use: No    Family History Family History  Problem Relation Age of Onset  . Breast cancer Other        ? of   . Heart attack Father 33  . Diabetes Sister        2  . Hypertension Sister   . Colon cancer Neg Hx   . Stomach cancer Neg Hx     Allergies  Allergen Reactions  . Codeine Nausea And Vomiting  . Morphine Nausea And Vomiting  . Oxycodone-Acetaminophen Nausea And Vomiting  . Sulfonamide Derivatives Nausea And Vomiting  . Tramadol Hcl Nausea And Vomiting     REVIEW OF SYSTEMS  General: [ ]  Weight loss, [ ]  Fever, [ ]  chills Neurologic: [ ]  Dizziness, [ ]  Blackouts, [ ]  Seizure [ ]  Stroke, [ ]  "Mini stroke", [ ]  Slurred speech, [ ]  Temporary blindness; [ ]  weakness in arms or legs, [ ]  Hoarseness [ ]  Dysphagia Cardiac: [ ]  Chest pain/pressure, [ ]  Shortness of breath at rest [ ]  Shortness of breath with exertion, [ ]  Atrial fibrillation or irregular heartbeat  Vascular: [ ]   Pain in legs with walking, [ ]  Pain in legs at rest, [ ]  Pain in legs at  night,  [ ]  Non-healing ulcer, [ ]  Blood clot in vein/DVT,   Pulmonary: [ ]  Home oxygen, [ ]  Productive cough, [ ]  Coughing up blood, [ ]  Asthma,  [ ]  Wheezing [ ]  COPD Musculoskeletal:  [ ]  Arthritis, [ ]  Low back pain, [ ]  Joint pain Hematologic: [ ]  Easy Bruising, [ ]  Anemia; [ ]  Hepatitis Gastrointestinal: [ ]  Blood in stool, [ ]  Gastroesophageal Reflux/heartburn, Urinary: [ ]  chronic Kidney disease, [ ]  on HD - [ ]  MWF or [ ]  TTHS, [ ]  Burning with urination, [ ]  Difficulty urinating Skin: [ ]  Rashes, [ ]  Wounds Psychological: [ ]  Anxiety, [ ]  Depression  Physical Examination Vitals:   08/06/19 0830 08/06/19 0900 08/06/19 0930 08/06/19 1000  BP: (!) 171/76 (!) 188/85 (!) 188/92 (!) 185/92  Pulse: 63 63 72 72  Resp:      Temp:      TempSrc:      SpO2:      Weight:      Height:       Body mass index is 22.94 kg/m.  General:  WDWN in NAD Gait: Normal HENT: WNL Eyes: Pupils equal Pulmonary: normal non-labored breathing , without Rales, rhonchi,  wheezing Cardiac: RRR, without  Murmurs, rubs or gallops; No carotid bruits Abdomen: soft, NT, no masses Skin: no rashes, ulcers noted;  no Gangrene , no cellulitis; no open wounds;   Vascular Exam/Pulses:Palpable B UE radial pulses, brachial.  Chronic left UE lymphedema.     Musculoskeletal: no muscle wasting or atrophy; left UE lymphedema edema  Neurologic: A&O X 3; Appropriate Affect ;  SENSATION: normal; MOTOR FUNCTION: 5/5 Symmetric Speech is fluent/normal   Significant Diagnostic Studies: CBC Lab Results  Component Value Date   WBC 7.1 08/06/2019   HGB 9.1 (L) 08/06/2019   HCT 27.3 (L) 08/06/2019   MCV 94.1 08/06/2019   PLT 129 (L) 08/06/2019    BMET    Component Value Date/Time   NA 139 08/06/2019 0719   NA 142 06/22/2019   K 3.8 08/06/2019 0719   CL 104 08/06/2019 0719   CO2 25 08/06/2019 0719   GLUCOSE 103 (H) 08/06/2019 0719   BUN 54 (H) 08/06/2019 0719   BUN 85 (A) 06/22/2019   CREATININE 4.17  (H) 08/06/2019 0719   CALCIUM 6.4 (LL) 08/06/2019 0719   GFRNONAA 9 (L) 08/06/2019 0719   GFRAA 11 (L) 08/06/2019 0719   Estimated Creatinine Clearance: 8.5 mL/min (A) (by C-G formula based on SCr of 4.17 mg/dL (H)).  COAG Lab Results  Component Value Date   INR 1.0 08/03/2019   INR 1.0 05/30/2019   INR 1.1 05/16/2019     Non-Invasive Vascular Imaging:  Pending vein mapping  ASSESSMENT/PLAN:  ESRD on HD via right TDC Left UE with chronic lymphedema.  IV in right UE AC.  Pending vein mapping will plan right UE av fistula verses graft.     Roxy Horseman 08/06/2019 10:04 AM   History and exam details as above.  Patient has a chronically swollen left upper extremity.  This is not going to be the ideal extremity for placing a hemodialysis access.  She has fairly small veins in the right arm.  She currently has an IV in the right arm.  Eventually she is going to need to have the IV removed from the right arm in preparation for placing  an AV fistula or graft.  We will leave this in for now but after her vein mapping and we have a decision in place on what her next access will be she will have to have the IV removed so that we can use the right arm for her access.  Vein mapping is pending.  I will come back and visit with her after the vein mapping is complete to make a definitive plan.  Ruta Hinds, MD [facility] Office: [phone]

## 2019-08-06 NOTE — Progress Notes (Signed)
Subjective:  Patient seen this morning while on dialysis. Tolerating well. She denies any chest pain, shortness of breath, n/v, abdominal pain, changes in stool.  Discussed plan for kidney biopsy and AVF in case she were to need to continue HD outpatient.   Objective Vital signs in last 24 hours: Vitals:   08/06/19 1000 08/06/19 1030 08/06/19 1040 08/06/19 1045  BP: (!) 185/92 (!) 191/87 (!) 214/88 (!) 176/73  Pulse: 72 74 78 92  Resp:    18  Temp:    (!) 97.5 F (36.4 C)  TempSrc:    Oral  SpO2:    98%  Weight:    54.8 kg  Height:       Weight change: -1.646 kg  Intake/Output Summary (Last 24 hours) at 08/06/2019 1140 Last data filed at 08/06/2019 1045 Gross per 24 hour  Intake 283 ml  Output 2478 ml  Net -2195 ml    Assessment/ Plan: 1. Acute kidney injury on chronic kidney disease stage EN:IDPOEUM oliguric at this time without any acute electrolyte abnormality-mild metabolic acidosis and hyperkalemia noted. Possible differential diagnosis include acute interstitial nephritis, ATN and very less likely infection associated GN. Urinalysis is bland, arguing against interstitial nephritis and glomerulonephritis. No evidence of obstruction on ultrasound. Dialysis started Friday for uremia. Receiving third treatment today.Given that hgb has remained stable the last couple of days, will proceed with renal biopsy which will help with narrowing differential.  She is currently on prednisone for her inflammatory bowel disease/Crohn's. Would like to avoid stopping PPI unless indicated based on further work-up.  Have consulted [facility] for permanent HD access in case she were to need continued HD outpatient. Plan for right UE AVF verses graft tomorrow.   2. Volume overload: improved with HD. Down 11 kg from admission.  3. Anion gap metabolic acidosis:Secondary to acute kidney injury and mild lactic acidosis, improving with dialysis.   4. Cellulitis of left upper extremity: Superimposed  on chronic lymphedema following ipsilateral mastectomy/lymph node dissection. On ceftriaxone and status post vancomycin-monitor cautiously for vancomycin toxicity.  5. Anemia: Not clearly related to iron deficiency. Monitor for signs of bleeding.  6.Hypertension: Monitor on current antihypertensive therapy/ultrafiltration with HD.  7.  Metabolic bone disease.  PTH pending  Phos stable at 5.1.  Calcium remains low, increased 3.5 Ca bath with HD. Continue calcitriol.   Delice Bison    Labs: Basic Metabolic Panel: Recent Labs  Lab 08/05/19 0225 08/05/19 1038 08/05/19 1115 08/06/19 0719  NA  --  139 134* 139  K  --  3.5 3.0* 3.8  CL  --  102 97* 104  CO2  --  22 26 25   GLUCOSE  --  116* 104* 103*  BUN  --  75* <5* 54*  CREATININE  --  4.85* 0.43* 4.17*  CALCIUM  --  6.1* 7.4* 6.4*  PHOS 5.6* 5.1*  --  4.7*   Liver Function Tests: Recent Labs  Lab 08/02/19 2149 08/04/19 0754 08/05/19 1038 08/06/19 0719  AST 20 14*  --   --   ALT 26 17  --   --   ALKPHOS 51 38  --   --   BILITOT 0.4 0.2*  --   --   PROT 6.0* 4.4*  --   --   ALBUMIN 2.6* 1.8* 1.9* 1.8*   No results for input(s): LIPASE, AMYLASE in the last 168 hours. No results for input(s): AMMONIA in the last 168 hours. CBC: Recent Labs  Lab 08/02/19 2149  08/03/19 0328  08/04/19 0754 08/05/19 1039 08/05/19 1115 08/06/19 0719  WBC 5.0 5.7  --  4.7 6.8 5.5 7.1  NEUTROABS 4.2 4.4  --   --   --   --   --   HGB 7.3* 6.2*   < > 9.5* 9.7* 10.4* 9.1*  HCT 22.6* 18.5*   < > 26.9* 28.2* 30.2* 27.3*  MCV 102.3* 96.9  --  90.6 93.7 93.5 94.1  PLT 322 274  --  172 177 132* 129*   < > = values in this interval not displayed.   Cardiac Enzymes: No results for input(s): CKTOTAL, CKMB, CKMBINDEX, TROPONINI in the last 168 hours. CBG: Recent Labs  Lab 08/05/19 1204 08/05/19 1638 08/05/19 1901 08/05/19 2234 08/06/19 1120  GLUCAP 122* 256* 192* 112* 74    Iron Studies: No results for input(s): IRON,  TIBC, TRANSFERRIN, FERRITIN in the last 72 hours. Studies/Results: No results found. Medications: Infusions: . sodium chloride    . cefTRIAXone (ROCEPHIN)  IV 1 g (08/05/19 2155)    Scheduled Medications: . amLODipine  10 mg Oral Daily  . atorvastatin  20 mg Oral Daily  . calcitRIOL  0.25 mcg Oral Daily  . Chlorhexidine Gluconate Cloth  6 each Topical Q0600  . insulin aspart  0-9 Units Subcutaneous TID WC  . metoprolol tartrate  25 mg Oral BID  . pantoprazole  40 mg Oral BID  . predniSONE  40 mg Oral Q breakfast  . sodium chloride flush  3 mL Intravenous Q12H  . sodium chloride flush  3 mL Intravenous Q12H    have reviewed scheduled and prn medications.  Physical Exam: General: awake, alert, lying comfortably in bed in NAD Heart: RRR Lungs: CTAB Abdomen: soft, non-tender, non-distended  Extremities: no edema    08/06/2019,11:40 AM  LOS: 4 days

## 2019-08-06 NOTE — Progress Notes (Signed)
Critical calcium 6.4. Dr. Maryjane Hurter aware orders for 3.5ca bath. Pt. Change to 3.5ca

## 2019-08-07 ENCOUNTER — Other Ambulatory Visit: Payer: Medicare Other | Admitting: *Deleted

## 2019-08-07 ENCOUNTER — Inpatient Hospital Stay (HOSPITAL_COMMUNITY): Payer: Medicare Other

## 2019-08-07 DIAGNOSIS — N186 End stage renal disease: Secondary | ICD-10-CM | POA: Diagnosis not present

## 2019-08-07 LAB — RENAL FUNCTION PANEL
Albumin: 1.9 g/dL — ABNORMAL LOW (ref 3.5–5.0)
Anion gap: 12 (ref 5–15)
BUN: 33 mg/dL — ABNORMAL HIGH (ref 8–23)
CO2: 21 mmol/L — ABNORMAL LOW (ref 22–32)
Calcium: 7.3 mg/dL — ABNORMAL LOW (ref 8.9–10.3)
Chloride: 103 mmol/L (ref 98–111)
Creatinine, Ser: 3.49 mg/dL — ABNORMAL HIGH (ref 0.44–1.00)
GFR calc Af Amer: 14 mL/min — ABNORMAL LOW (ref >60)
GFR calc non Af Amer: 12 mL/min — ABNORMAL LOW (ref >60)
Glucose, Bld: 247 mg/dL — ABNORMAL HIGH (ref 70–99)
Phosphorus: 4.4 mg/dL (ref 2.5–4.6)
Potassium: 4.2 mmol/L (ref 3.5–5.1)
Sodium: 136 mmol/L (ref 135–145)

## 2019-08-07 LAB — CBC
HCT: 26.4 % — ABNORMAL LOW (ref 36.0–46.0)
HCT: 31.9 % — ABNORMAL LOW (ref 36.0–46.0)
Hemoglobin: 9 g/dL — ABNORMAL LOW (ref 12.0–15.0)
Hemoglobin: 10.7 g/dL — ABNORMAL LOW (ref 12.0–15.0)
MCH: 32 pg (ref 26.0–34.0)
MCH: 31.4 pg (ref 26.0–34.0)
MCHC: 34.1 g/dL (ref 30.0–36.0)
MCHC: 33.5 g/dL (ref 30.0–36.0)
MCV: 94 fL (ref 80.0–100.0)
MCV: 93.5 fL (ref 80.0–100.0)
Platelets: 105 10*3/uL — ABNORMAL LOW (ref 150–400)
Platelets: 109 10*3/uL — ABNORMAL LOW (ref 150–400)
RBC: 2.81 MIL/uL — ABNORMAL LOW (ref 3.87–5.11)
RBC: 3.41 MIL/uL — ABNORMAL LOW (ref 3.87–5.11)
RDW: 17.2 % — ABNORMAL HIGH (ref 11.5–15.5)
RDW: 17 % — ABNORMAL HIGH (ref 11.5–15.5)
WBC: 5.1 10*3/uL (ref 4.0–10.5)
WBC: 4.4 10*3/uL (ref 4.0–10.5)
nRBC: 0 % (ref 0.0–0.2)
nRBC: 0 % (ref 0.0–0.2)

## 2019-08-07 LAB — PARATHYROID HORMONE, INTACT (NO CA): PTH: 87 pg/mL — ABNORMAL HIGH (ref 15–65)

## 2019-08-07 LAB — PROTEIN ELECTROPHORESIS, SERUM
A/G Ratio: 1 (ref 0.7–1.7)
Albumin ELP: 2.3 g/dL — ABNORMAL LOW (ref 2.9–4.4)
Alpha-1-Globulin: 0.2 g/dL (ref 0.0–0.4)
Alpha-2-Globulin: 0.8 g/dL (ref 0.4–1.0)
Beta Globulin: 0.6 g/dL — ABNORMAL LOW (ref 0.7–1.3)
Gamma Globulin: 0.7 g/dL (ref 0.4–1.8)
Globulin, Total: 2.4 g/dL (ref 2.2–3.9)
Total Protein ELP: 4.7 g/dL — ABNORMAL LOW (ref 6.0–8.5)

## 2019-08-07 LAB — GLUCOSE, CAPILLARY
Glucose-Capillary: 77 mg/dL (ref 70–99)
Glucose-Capillary: 110 mg/dL — ABNORMAL HIGH (ref 70–99)
Glucose-Capillary: 274 mg/dL — ABNORMAL HIGH (ref 70–99)
Glucose-Capillary: 153 mg/dL — ABNORMAL HIGH (ref 70–99)

## 2019-08-07 LAB — GLOMERULAR BASEMENT MEMBRANE ANTIBODIES: GBM Ab: 3 units (ref 0–20)

## 2019-08-07 LAB — PROTIME-INR
INR: 1 (ref 0.8–1.2)
Prothrombin Time: 13 seconds (ref 11.4–15.2)

## 2019-08-07 LAB — ANTI-DNASE B ANTIBODY: Anti-DNAse-B: <78 U/mL (ref 0–120)

## 2019-08-07 MED ORDER — LIDOCAINE HCL (PF) 1 % IJ SOLN
5.0000 mL | INTRAMUSCULAR | Status: DC | PRN
  Filled 2019-08-07: qty 5

## 2019-08-07 MED ORDER — SODIUM CHLORIDE 0.9 % IV SOLN: 100.0000 mL | INTRAVENOUS | Status: DC | PRN

## 2019-08-07 MED ORDER — LIDOCAINE HCL (PF) 1 % IJ SOLN
INTRAMUSCULAR | Status: AC
  Filled 2019-08-07: qty 30

## 2019-08-07 MED ORDER — LIDOCAINE-PRILOCAINE 2.5-2.5 % EX CREA
1.0000 "application " | TOPICAL_CREAM | CUTANEOUS | Status: DC | PRN
  Filled 2019-08-07: qty 5

## 2019-08-07 MED ORDER — FENTANYL CITRATE (PF) 100 MCG/2ML IJ SOLN
INTRAMUSCULAR | Status: AC | PRN
  Administered 2019-08-07: 50 ug via INTRAVENOUS

## 2019-08-07 MED ORDER — ALTEPLASE 2 MG IJ SOLR
2.0000 mg | Freq: Once | INTRAMUSCULAR | Status: DC | PRN
  Filled 2019-08-07: qty 2

## 2019-08-07 MED ORDER — FENTANYL CITRATE (PF) 100 MCG/2ML IJ SOLN
INTRAMUSCULAR | Status: AC
  Filled 2019-08-07: qty 2

## 2019-08-07 MED ORDER — MIDAZOLAM HCL 2 MG/2ML IJ SOLN
INTRAMUSCULAR | Status: AC
  Filled 2019-08-07: qty 2

## 2019-08-07 MED ORDER — HEPARIN SODIUM (PORCINE) 1000 UNIT/ML DIALYSIS
1000.0000 [IU] | INTRAMUSCULAR | Status: DC | PRN
  Filled 2019-08-07: qty 1

## 2019-08-07 MED ORDER — CHLORHEXIDINE GLUCONATE CLOTH 2 % EX PADS
6.0000 | MEDICATED_PAD | Freq: Every day | CUTANEOUS | Status: DC
  Administered 2019-08-08 – 2019-08-09 (×2): 6 via TOPICAL

## 2019-08-07 MED ORDER — MIDAZOLAM HCL 2 MG/2ML IJ SOLN
INTRAMUSCULAR | Status: AC | PRN
  Administered 2019-08-07: 0.5 mg via INTRAVENOUS

## 2019-08-07 MED ORDER — PENTAFLUOROPROP-TETRAFLUOROETH EX AERO: 1.0000 "application " | INHALATION_SPRAY | CUTANEOUS | Status: DC | PRN

## 2019-08-07 NOTE — H&P (View-Only) (Signed)
Pt scheduled for right arm access based on vein map findings by Dr Scot Dock on Friday.  We will preop Thursday  Samantha Hinds, MD [facility] Office: [phone]

## 2019-08-07 NOTE — Care Management Important Message (Signed)
Important Message  Patient Details  Name: Samantha Mckenzie MRN: [id number] Date of Birth: [birth year]   Medicare Important Message Given:  Yes     Calib Wadhwa 08/07/2019, 4:06 PM

## 2019-08-07 NOTE — Progress Notes (Signed)
[facility]  Patient is currently enrolled in [place]. Will continue to follow to coordinate continued services after discharge.   Thank you,  Erling Conte, LCSW [phone]  Hilma Favors are listed daily on AMION under Hospice and [place]

## 2019-08-07 NOTE — Progress Notes (Signed)
Subjective:  No acute events overnight. Tolerated HD well yesterday. She has not eaten much, due to fear of flaring Crohn's symptoms. No headaches, chest pain, shortness of breath, n/v, changes in stool. States she had some urine output when she got up to use the bathroom this morning.    Objective Vital signs in last 24 hours: Vitals:   08/06/19 2121 08/07/19 0347 08/07/19 0416 08/07/19 0833  BP: 138/62  (!) 145/62 (!) 161/67  Pulse: 85  77 79  Resp: 18  18 18   Temp: 98.4 F (36.9 C)  98.1 F (36.7 C) 99 F (37.2 C)  TempSrc: Oral  Oral Oral  SpO2: 100%  100% 98%  Weight:  54.6 kg    Height:       Weight change: -69.2 kg  Intake/Output Summary (Last 24 hours) at 08/07/2019 1026 Last data filed at 08/07/2019 0900 Gross per 24 hour  Intake 240 ml  Output 2000 ml  Net -1760 ml    Assessment/ Plan: 1. Acute kidney injury on chronic kidney disease stage JF:[id number] differential diagnosis include acute interstitial nephritis, ATN and very less likely infection associated GN. Urinalysis is bland, arguing against interstitial nephritis and glomerulonephritis. No evidence of obstruction on ultrasound. Dialysis startedFridayfor uremia and has received total of 3 treatments.No urgent indication for HD today; will continue intermittent schedule. No urine output documented, but patient endorsed some this morning. Proceeding with renal biopsy today which will help with narrow differential.She is currently on prednisone for her inflammatory bowel disease/Crohn's. Would like to avoid stopping PPI unless indicated based on further work-up.  Have consulted [facility] for permanent HD access with likelihood that she will need to continue HD outpatient.   2. Volume overload:improved with HD. Down 11 kg from admission.  3. Anion gap metabolic acidosis:Secondary to acute kidney injury and mild lactic acidosis, improving with dialysis.   4. Cellulitis of left upper extremity: Superimposed on  chronic lymphedema following ipsilateral mastectomy/lymph node dissection. On ceftriaxone and status post vancomycin-monitor cautiously for vancomycin toxicity.  5. Anemia:Not clearly related to iron deficiency. Monitor for signs of bleeding.  6.Hypertension: blood pressure improved with transition from lopressor carvedilol 6.25 mg BID.   7. Metabolic bone disease. Continue Calcitriol to treat hypocalcemia.   Delice Bison    Labs: Basic Metabolic Panel: Recent Labs  Lab 08/05/19 0225 08/05/19 1038 08/05/19 1115 08/06/19 0719  NA  --  139 134* 139  K  --  3.5 3.0* 3.8  CL  --  102 97* 104  CO2  --  22 26 25   GLUCOSE  --  116* 104* 103*  BUN  --  75* <5* 54*  CREATININE  --  4.85* 0.43* 4.17*  CALCIUM  --  6.1* 7.4* 6.4*  PHOS 5.6* 5.1*  --  4.7*   Liver Function Tests: Recent Labs  Lab 08/02/19 2149 08/04/19 0754 08/05/19 1038 08/06/19 0719  AST 20 14*  --   --   ALT 26 17  --   --   ALKPHOS 51 38  --   --   BILITOT 0.4 0.2*  --   --   PROT 6.0* 4.4*  --   --   ALBUMIN 2.6* 1.8* 1.9* 1.8*   No results for input(s): LIPASE, AMYLASE in the last 168 hours. No results for input(s): AMMONIA in the last 168 hours. CBC: Recent Labs  Lab 08/02/19 2149 08/03/19 0328  08/04/19 0754 08/05/19 1039 08/05/19 1115 08/06/19 0719 08/07/19 0311  WBC 5.0 5.7  --  4.7 6.8 5.5 7.1 5.1  NEUTROABS 4.2 4.4  --   --   --   --   --   --   HGB 7.3* 6.2*   < > 9.5* 9.7* 10.4* 9.1* 9.0*  HCT 22.6* 18.5*   < > 26.9* 28.2* 30.2* 27.3* 26.4*  MCV 102.3* 96.9  --  90.6 93.7 93.5 94.1 94.0  PLT 322 274  --  172 177 132* 129* 105*   < > = values in this interval not displayed.   Cardiac Enzymes: No results for input(s): CKTOTAL, CKMB, CKMBINDEX, TROPONINI in the last 168 hours. CBG: Recent Labs  Lab 08/05/19 2234 08/06/19 1120 08/06/19 1728 08/06/19 2119 08/07/19 0743  GLUCAP 112* 74 173* 105* 77    Iron Studies: No results for input(s): IRON, TIBC,  TRANSFERRIN, FERRITIN in the last 72 hours. Studies/Results: No results found. Medications: Infusions: . sodium chloride    . cefTRIAXone (ROCEPHIN)  IV 1 g (08/06/19 2143)    Scheduled Medications: . amLODipine  10 mg Oral Daily  . atorvastatin  20 mg Oral Daily  . calcitRIOL  0.25 mcg Oral Daily  . carvedilol  6.25 mg Oral BID WC  . Chlorhexidine Gluconate Cloth  6 each Topical Q0600  . fentaNYL      . hydrALAZINE  10 mg Oral Q8H  . insulin aspart  0-9 Units Subcutaneous TID WC  . lidocaine (PF)      . midazolam      . pantoprazole  40 mg Oral BID  . predniSONE  40 mg Oral Q breakfast  . sodium chloride flush  3 mL Intravenous Q12H  . sodium chloride flush  3 mL Intravenous Q12H    have reviewed scheduled and prn medications.  Physical Exam: General: awake, alert, lying in bed in NAD Heart: RRR Lungs: CTAB Abdomen: soft, non-tender, non-distended Extremities: chronic LUE lymphedema; no BLE edema    08/07/2019,10:26 AM  LOS: 5 days

## 2019-08-07 NOTE — Progress Notes (Signed)
Pt scheduled for right arm access based on vein map findings by Dr Scot Dock on Friday.  We will preop Thursday  Ruta Hinds, MD [facility] Office: [phone]

## 2019-08-07 NOTE — Progress Notes (Addendum)
PT Cancellation Note  Patient Details Name: Samantha Mckenzie MRN: [id number] DOB: 07/15/1936   Cancelled Treatment:     Pt underwent renal biopsy earlier today & RN recommends to hold therapy at this time.    Waunita Schooner 08/07/2019, 3:25 PM

## 2019-08-07 NOTE — Procedures (Addendum)
Interventional Radiology Procedure Note  Procedure: US Guided Biopsy of left kidney  Complications: None  Estimated Blood Loss: < 10 mL  Findings: 16 G core biopsy of left LP renal cortex performed under US guidance.  Two core samples obtained and sent to Pathology.  Venetia Night. Kathlene Cote, M.D Pager:  [phone]

## 2019-08-07 NOTE — Progress Notes (Signed)
Upper extremity vein mapping has been completed.   Preliminary results in CV Proc.   Abram Sander 08/07/2019 2:27 PM

## 2019-08-07 NOTE — Progress Notes (Signed)
PROGRESS NOTE    Samantha Mckenzie  OAC:[id number] DOB: 19-Jun-1936 DOA: 08/02/2019 PCP: Colon Branch, MD   Brief Narrative: 83 year old with past medical history significant for depression, breast cancer status post left mastectomy/chemotherapy radiation, anemia Crohn's ileitis, CAD and CKD stage IV who presents to the emergency department for evaluation of progressive left arm swelling and bilateral lower extremity edema.  Patient reports occasional left arm swelling since her left mastectomy 20 years ago, but this has been much worse in recent weeks, also report redness.  She was treated as an outpatient with 2 courses of antibiotics without significant improvement.  She denies hematochezia melena.  Evaluation in the ED patient was found to be afebrile, blood pressure stable.  Radiograph of the left forearm and left wrist demonstrate soft tissue swelling of the forearm subcutaneous edema no bony abnormality and suggestive of cellulitis.  Patient was found to have acute on chronic worsening renal function creatinine at 6, bicarb at 15, BUN 108.  CBC hemoglobin 7.3.\  Patient has been admitted for further management of acute on chronic renal failure,  left arm cellulitis that failed oral antibiotics. Patient hemoglobin dropped further to 6.8.  She reports black stool, which she is taking oral iron.  She denies hematochezia. Received 2 units PRBC. HB has remain stable. Evaluated by GI no need for further work up ules evidence of active bleeding. Patient underwent, permanent HD catheter placement this admission. She also underwent kidney Biopsy 10-17. Results pending.    Assessment & Plan:   Principal Problem:   Acute renal failure superimposed on stage 4 chronic kidney disease (HCC) Active Problems:   Diabetes mellitus type 2 with complications (HCC)   Macrocytic anemia   Essential hypertension   Cellulitis of arm, left   Acidosis   1-Acute kidney injuries superimposed on chronic kidney  disease a stage IV Metabolic acidosis hyperkalemia: -Patient presented with a BUN 108 and creatinine at 6.  Earlier this month creatinine 3.7 -Nephrology has been consulted and is recommending to start patient on hemodialysis. -Patient underwent right IJ tunneled  hemodialysis catheter placement on 10-23.  -Per nephrology patient might require renal biopsy during this hospitalization. -Had HD 10-26. -Underwent  kidney biopsy on 10-27. Biopsy results pending.  -Vascular consulted, for evaluation for AV fistula or graft. Plan for Right arm access on Friday.   2-Left arm cellulitis: Cellulitis superimposed on lymphedema. Continue with IV ceftriaxone.  5 antibiotic Continue with elevation of the arm. [place] negative for DVT.  Still with significant edema.   3-Anemia: This could be anemia of chronic kidney disease. No active bleeding.  She report black stool but she is taking oral iron. We will check occult blood but these might be positive with her history of Crohn's ileitis. Received 2 units of packed red blood cell. I have started PPI twice daily. Monitor hemoglobin and monitor for melena. GI consulted.  Further recommendation.  No further recommendation. Hb has remain stable. Around 9.   4-DM; Continue with a sliding scale insulin.  5-Hypertension: Continue with metoprolol and Norvasc 6-CAD: Continue with the statins and beta-blocker. 7-Crohn's disease: Continue with prednisone.  For diarrhea today will resume home dose Imodium as needed Hypocalcemia; corrected by albumin. Corrected calcium 8.1  Estimated body mass index is 22 kg/m as calculated from the following:   Height as of this encounter: 5' 2"  (1.575 m).   Weight as of this encounter: 54.6 kg.   DVT prophylaxis: SCDs due to anemia. Code Status: DNR Family Communication:  Care discussed with patient, decline that I contact family Disposition Plan: Remain in the hospital for further treatment of AKI, anemia.  Left upper  extremity cellulitis requiring IV antibiotics.  Blood transfusion. Consultants:   Nephrology  Procedures:   Hemodialysis tunnel catheter  Antimicrobials:  Ceftriaxone 10-23  Subjective: She is feeling well, diarrhea improved with imodium.    Objective: Vitals:   08/07/19 1045 08/07/19 1045 08/07/19 1050 08/07/19 1055  BP: 134/64 134/64 130/60 133/63  Pulse:  77 78 79  Resp: (!) 0 (!) 8 (!) 8 11  Temp:      TempSrc:      SpO2:  100% 100% 97%  Weight:      Height:        Intake/Output Summary (Last 24 hours) at 08/07/2019 1247 Last data filed at 08/07/2019 0900 Gross per 24 hour  Intake 0 ml  Output --  Net 0 ml   Filed Weights   08/06/19 0700 08/06/19 1045 08/07/19 0347  Weight: 56.9 kg 54.8 kg 54.6 kg    Examination:  General exam: NAD Respiratory system: CTA Cardiovascular system: S 1, S 2 rrr Gastrointestinal system: BS present, soft, nt Central nervous system: Non focal.  Extremities: Symmetric power.  Skin Left upper extremity with less edema and redness   Data Reviewed: I have personally reviewed following labs and imaging studies  CBC: Recent Labs  Lab 08/02/19 2149 08/03/19 0328  08/04/19 0754 08/05/19 1039 08/05/19 1115 08/06/19 0719 08/07/19 0311  WBC 5.0 5.7  --  4.7 6.8 5.5 7.1 5.1  NEUTROABS 4.2 4.4  --   --   --   --   --   --   HGB 7.3* 6.2*   < > 9.5* 9.7* 10.4* 9.1* 9.0*  HCT 22.6* 18.5*   < > 26.9* 28.2* 30.2* 27.3* 26.4*  MCV 102.3* 96.9  --  90.6 93.7 93.5 94.1 94.0  PLT 322 274  --  172 177 132* 129* 105*   < > = values in this interval not displayed.   Basic Metabolic Panel: Recent Labs  Lab 08/03/19 0328 08/04/19 0754 08/05/19 0225 08/05/19 1038 08/05/19 1115 08/06/19 0719  NA 134* 139  --  139 134* 139  K 5.1 4.0  --  3.5 3.0* 3.8  CL 101 105  --  102 97* 104  CO2 16* 22  --  22 26 25   GLUCOSE 150* 91  --  116* 104* 103*  BUN 113* 58*  --  75* <5* 54*  CREATININE 5.92* 3.92*  --  4.85* 0.43* 4.17*  CALCIUM  6.1* 6.3*  --  6.1* 7.4* 6.4*  PHOS  --   --  5.6* 5.1*  --  4.7*   GFR: Estimated Creatinine Clearance: 8.5 mL/min (A) (by C-G formula based on SCr of 4.17 mg/dL (H)). Liver Function Tests: Recent Labs  Lab 08/02/19 2149 08/04/19 0754 08/05/19 1038 08/06/19 0719  AST 20 14*  --   --   ALT 26 17  --   --   ALKPHOS 51 38  --   --   BILITOT 0.4 0.2*  --   --   PROT 6.0* 4.4*  --   --   ALBUMIN 2.6* 1.8* 1.9* 1.8*   No results for input(s): LIPASE, AMYLASE in the last 168 hours. No results for input(s): AMMONIA in the last 168 hours. Coagulation Profile: Recent Labs  Lab 08/03/19 2155 08/07/19 0311  INR 1.0 1.0   Cardiac Enzymes: No results for input(s):  CKTOTAL, CKMB, CKMBINDEX, TROPONINI in the last 168 hours. BNP (last 3 results) No results for input(s): PROBNP in the last 8760 hours. HbA1C: No results for input(s): HGBA1C in the last 72 hours. CBG: Recent Labs  Lab 08/06/19 1120 08/06/19 1728 08/06/19 2119 08/07/19 0743 08/07/19 1136  GLUCAP 74 173* 105* 77 110*   Lipid Profile: No results for input(s): CHOL, HDL, LDLCALC, TRIG, CHOLHDL, LDLDIRECT in the last 72 hours. Thyroid Function Tests: No results for input(s): TSH, T4TOTAL, FREET4, T3FREE, THYROIDAB in the last 72 hours. Anemia Panel: No results for input(s): VITAMINB12, FOLATE, FERRITIN, TIBC, IRON, RETICCTPCT in the last 72 hours. Sepsis Labs: Recent Labs  Lab 08/02/19 2149 08/03/19 0328  LATICACIDVEN 2.3*   4.3* 1.2    Recent Results (from the past 240 hour(s))  Blood culture (routine x 2)     Status: None (Preliminary result)   Collection Time: 08/02/19  9:49 PM   Specimen: BLOOD  Result Value Ref Range Status   Specimen Description   Final    BLOOD RIGHT ANTECUBITAL Performed at [facility] [address] [place], [place] [address]    Special Requests   Final    BOTTLES DRAWN AEROBIC AND ANAEROBIC Blood Culture adequate volume Performed at [facility] [address] [place], [place] [address]    Culture   Final    NO GROWTH 4 DAYS Performed at [facility], [place] [address] [place], [place] [address]    Report Status PENDING  Incomplete  Blood culture (routine x 2)     Status: None (Preliminary result)   Collection Time: 08/02/19  9:49 PM   Specimen: BLOOD  Result Value Ref Range Status   Specimen Description   Final    BLOOD RIGHT ANTECUBITAL Performed at [facility] [address] [place], [place] [address]    Special Requests   Final    BOTTLES DRAWN AEROBIC AND ANAEROBIC Blood Culture results may not be optimal due to an inadequate volume of blood received in culture bottles Performed at [facility] [address], [place], [place] [address]    Culture   Final    NO GROWTH 4 DAYS Performed at [facility], [place] [address], [place],  [address]    Report Status PENDING  Incomplete  SARS CORONAVIRUS 2 (TAT 6-24 HRS) Nasopharyngeal Nasal Mucosa     Status: None   Collection Time: 08/03/19  2:40 AM   Specimen: Nasal Mucosa; Nasopharyngeal  Result Value Ref Range Status   SARS Coronavirus 2 NEGATIVE NEGATIVE Final    Comment: (NOTE) SARS-CoV-2 target nucleic acids are NOT DETECTED. The SARS-CoV-2 RNA is generally detectable in upper and lower respiratory specimens during the acute phase of infection. Negative results do not preclude SARS-CoV-2 infection, do not rule out co-infections with other pathogens, and should not be used as the sole basis for treatment or other patient management decisions. Negative results must be combined with clinical observations, patient history, and epidemiological information. The expected result is Negative. Fact Sheet for Patients: SugarRoll.be Fact Sheet for Healthcare Providers: https://www.woods-mathews.com/ This test is not yet approved or cleared by the [place] FDA and    has been authorized for detection and/or diagnosis of SARS-CoV-2 by FDA under an Emergency Use Authorization (EUA). This EUA will remain  in effect (meaning this test can be used) for the duration of the COVID-19 declaration under Section 56 4(b)(1) of the Act, 21 U.S.C.  section 360bbb-3(b)(1), unless the authorization is terminated or revoked sooner. Performed at [facility], [place] [address] [place], [place] [address]   MRSA PCR Screening     Status: None   Collection Time: 08/03/19  2:40 AM  Result Value Ref Range Status   MRSA by PCR NEGATIVE NEGATIVE Final    Comment:        The GeneXpert MRSA Assay (FDA approved for NASAL specimens only), is one component of a comprehensive MRSA colonization surveillance program. It is not intended to diagnose MRSA infection nor to guide or monitor treatment for MRSA infections. Performed at [facility], [place] [address], [place], [place] [address]          Radiology Studies: No results found.      Scheduled Meds:  amLODipine  10 mg Oral Daily   atorvastatin  20 mg Oral Daily   calcitRIOL  0.25 mcg Oral Daily   carvedilol  6.25 mg Oral BID WC   Chlorhexidine Gluconate Cloth  6 each Topical Q0600   [START ON 08/08/2019] Chlorhexidine Gluconate Cloth  6 each Topical Q0600   fentaNYL       hydrALAZINE  10 mg Oral Q8H   insulin aspart  0-9 Units Subcutaneous TID WC   lidocaine (PF)       midazolam       pantoprazole  40 mg Oral BID   predniSONE  40 mg Oral Q breakfast   sodium chloride flush  3 mL Intravenous Q12H   sodium chloride flush  3 mL Intravenous Q12H   Continuous Infusions:  sodium chloride     cefTRIAXone (ROCEPHIN)  IV 1 g (08/06/19 2143)     LOS: 5 days    Time spent: 35 minutes     Elmarie Shiley, MD [facility] Pager [phone]  If 7PM-7AM, please contact night-coverage [email or web address] Password [facility] 08/07/2019, 12:47 PM

## 2019-08-07 NOTE — Progress Notes (Signed)
Renal Navigator received notification from Nephrologist/Dr. Maryjane Hurter that patient needs referral for OP HD treatment. Renal Navigator met with patient to complete assessment. Patient was pleasant and welcoming and very conversant. She requests [facility] second shift if possible, any days. She states she will drive herself once she feels well enough and that her husband can also provide transportation. Renal Navigator submitted OP HD referral to [facility] Admissions and will continue to follow closely.  Alphonzo Cruise, [place] Renal Navigator [phone]

## 2019-08-08 LAB — RENAL FUNCTION PANEL
Albumin: 1.6 g/dL — ABNORMAL LOW (ref 3.5–5.0)
Anion gap: 9 (ref 5–15)
BUN: 44 mg/dL — ABNORMAL HIGH (ref 8–23)
CO2: 23 mmol/L (ref 22–32)
Calcium: 6.9 mg/dL — ABNORMAL LOW (ref 8.9–10.3)
Chloride: 104 mmol/L (ref 98–111)
Creatinine, Ser: 3.95 mg/dL — ABNORMAL HIGH (ref 0.44–1.00)
GFR calc Af Amer: 12 mL/min — ABNORMAL LOW (ref >60)
GFR calc non Af Amer: 10 mL/min — ABNORMAL LOW (ref >60)
Glucose, Bld: 153 mg/dL — ABNORMAL HIGH (ref 70–99)
Phosphorus: 4.4 mg/dL (ref 2.5–4.6)
Potassium: 3.5 mmol/L (ref 3.5–5.1)
Sodium: 136 mmol/L (ref 135–145)

## 2019-08-08 LAB — CBC
HCT: 25.4 % — ABNORMAL LOW (ref 36.0–46.0)
Hemoglobin: 8.4 g/dL — ABNORMAL LOW (ref 12.0–15.0)
MCH: 31.5 pg (ref 26.0–34.0)
MCHC: 33.1 g/dL (ref 30.0–36.0)
MCV: 95.1 fL (ref 80.0–100.0)
Platelets: 103 10*3/uL — ABNORMAL LOW (ref 150–400)
RBC: 2.67 MIL/uL — ABNORMAL LOW (ref 3.87–5.11)
RDW: 17 % — ABNORMAL HIGH (ref 11.5–15.5)
WBC: 6.4 10*3/uL (ref 4.0–10.5)
nRBC: 0 % (ref 0.0–0.2)

## 2019-08-08 LAB — CULTURE, BLOOD (ROUTINE X 2)
Culture: NO GROWTH
Culture: NO GROWTH
Special Requests: ADEQUATE

## 2019-08-08 LAB — GLUCOSE, CAPILLARY
Glucose-Capillary: 107 mg/dL — ABNORMAL HIGH (ref 70–99)
Glucose-Capillary: 92 mg/dL (ref 70–99)
Glucose-Capillary: 170 mg/dL — ABNORMAL HIGH (ref 70–99)
Glucose-Capillary: 254 mg/dL — ABNORMAL HIGH (ref 70–99)

## 2019-08-08 MED ORDER — HEPARIN SODIUM (PORCINE) 1000 UNIT/ML IJ SOLN
INTRAMUSCULAR | Status: AC
  Filled 2019-08-08: qty 4

## 2019-08-08 NOTE — Progress Notes (Signed)
Physical Therapy Treatment Patient Details Name: Samantha Mckenzie MRN: [id number] DOB: [birth year] Today's Date: 08/08/2019    History of Present Illness 83 y.o. female admitted on 08/03/19 for L UE and bil LE swelling.  L UE negative for DVT suspected cellulitis superimposed on L UE lymphedema from L mastectomy.  Pt also found to be in acute renal failure on chronic stage IV kidney disease.  HD catheter placed for her to start dialysis (new).  Pt also with metabolic acidosis, hyperkalemia, anemia (could be of chronic kidney disease, but also found to have heme (+) stool), s/p 2 units PRBCs, GI consulted (h/o Crohn's).  Pt with other significant PMH of HTN, DM, CAD, gout, breast CA.    PT Comments    Pt progressing well towards her physical therapy goals, requiring less assist with use of Rollator versus rolling walker for ambulation. Ambulating 550 feet at a supervision level. However, does continue with decreased endurance/activity tolerance, as noticed by requiring more assist with transfers upon return to room. Will continue recommendation for HHPT currently and progress as tolerated.     Follow Up Recommendations  Home health PT     Equipment Recommendations  None recommended by PT    Recommendations for Other Services       Precautions / Restrictions Precautions Precautions: Fall Restrictions Weight Bearing Restrictions: No    Mobility  Bed Mobility               General bed mobility comments: Sitting EOB upon arrival  Transfers Overall transfer level: Needs assistance Equipment used: Rolling walker (2 wheeled) Transfers: Sit to/from Stand Sit to Stand: Min guard;Mod assist         General transfer comment: Pt requiring min guard for standing edge of bed, slow to rise. However, post mobility, requiring moderate assist to stand from toilet after 2 attempts  Ambulation/Gait Ambulation/Gait assistance: Supervision Gait Distance (Feet): 550 Feet Assistive  device: 4-wheeled walker Gait Pattern/deviations: Step-through pattern     General Gait Details: Steady, fluid pace with use of Rollator, supervision for safety   Stairs             Wheelchair Mobility    Modified Rankin (Stroke Patients Only)       Balance Overall balance assessment: Needs assistance Sitting-balance support: Feet supported;No upper extremity supported Sitting balance-Leahy Scale: Good     Standing balance support: No upper extremity supported;During functional activity Standing balance-Leahy Scale: Fair Standing balance comment: Washing hands at sink with supervision                            Cognition Arousal/Alertness: Awake/alert Behavior During Therapy: WFL for tasks assessed/performed Overall Cognitive Status: Within Functional Limits for tasks assessed                                        Exercises      General Comments        Pertinent Vitals/Pain Pain Assessment: No/denies pain    Home Living                      Prior Function            PT Goals (current goals can now be found in the care plan section) Acute Rehab PT Goals Patient Stated Goal: to get back to walking for  exercise Potential to Achieve Goals: Good Progress towards PT goals: Progressing toward goals    Frequency    Min 3X/week      PT Plan Current plan remains appropriate    Co-evaluation              AM-PAC PT "6 Clicks" Mobility   Outcome Measure  Help needed turning from your back to your side while in a flat bed without using bedrails?: None Help needed moving from lying on your back to sitting on the side of a flat bed without using bedrails?: None Help needed moving to and from a bed to a chair (including a wheelchair)?: None Help needed standing up from a chair using your arms (e.g., wheelchair or bedside chair)?: A Lot Help needed to walk in hospital room?: None Help needed climbing 3-5 steps  with a railing? : A Lot 6 Click Score: 20    End of Session   Activity Tolerance: Patient tolerated treatment well Patient left: in bed;with call bell/phone within reach;with nursing/sitter in room Nurse Communication: Mobility status PT Visit Diagnosis: Muscle weakness (generalized) (M62.81);Difficulty in walking, not elsewhere classified (R26.2);Unsteadiness on feet (R26.81)     Time: 0940-7680 PT Time Calculation (min) (ACUTE ONLY): 16 min  Charges:  $Therapeutic Activity: 8-22 mins                     Ellamae Sia, PT, DPT Acute Rehabilitation Services Pager [phone] Office [phone]    Willy Eddy 08/08/2019, 8:55 AM

## 2019-08-08 NOTE — Procedures (Signed)
I have seen and examined this patient and agree with the plan of care .  Patient seen on dialysis appears to be doing well  Sherril Croon 08/08/2019, 3:53 PM

## 2019-08-08 NOTE — Progress Notes (Signed)
Samantha Mckenzie  PROGRESS NOTE    SHERITA DECOSTE  PYK:[id number] DOB: 1936/03/10 DOA: 08/02/2019 PCP: Colon Branch, MD   Brief Narrative:   83 year old with past medical history significant for depression, breast cancer status post left mastectomy/chemotherapy radiation, anemia Crohn's ileitis, CAD and CKD stage IV who presents to the emergency department for evaluation of progressive left arm swelling and bilateral lower extremity edema.  Patient reports occasional left arm swelling since her left mastectomy 20 years ago, but this has been much worse in recent weeks, also report redness.  She was treated as an outpatient with 2 courses of antibiotics without significant improvement.  She denies hematochezia melena.  10/28: Denies complaints. Hgb down a little today. To go to HD today.    Assessment & Plan:   Principal Problem:   Acute renal failure superimposed on stage 4 chronic kidney disease (HCC) Active Problems:   Diabetes mellitus type 2 with complications (HCC)   Macrocytic anemia   Essential hypertension   Cellulitis of arm, left   Acidosis   Acute kidney injuries superimposed on chronic kidney disease a stage IV Metabolic acidosis hyperkalemia:     - Patient presented with a BUN 108 and creatinine at 6.  Earlier this month creatinine 3.7     - npehro onboard; now on iHD     - s/p renal Bx, awaiting results     - Vascular consulted, for evaluation for AV fistula or graft. Plan for Right arm access on Friday.   Left arm cellulitis: Cellulitis superimposed on lymphedema.     - ceftriaxone; END DATE 10/29     - elevate of the arm.     - [place] negative for DVT.      - supposed to be wearing arm compression at home, but non-compliant   Normocytic anemia, multifactoria     - No active bleeding.     - She report black stool but she is taking oral iron.     - FOBT positive     - GI has seen; no acute intevention at this timel rec continuing steroids for her Crohns     - s/p 2 units  pRBCs     - on PPT     - monitor Hgb  DM2     - Continue sliding scale insulin.   Hypertension:     - metoprolol, norvasc  CAD     - coreg, atorvastatin  Crohn's disease:     - prednisone.       - PRN Imodium  Says arm is feeling better. Anxious about HD. No changes for today. Monitor Hgb.   DVT prophylaxis: SCDs Code Status: DNR Family Communication: With son at bedside   Disposition Plan: TBD  Consultants:   Nephrology  GI  Antimicrobials:   Rocephin   ROS:  Reports improved arm swelling. Denies CP, dyspnea, N/V . Remainder 10-pt ROS is negative for all not previously mentioned.  Subjective: "It's looking better. It has its ups and downs."  Objective: Vitals:   08/08/19 1445 08/08/19 1500 08/08/19 1530 08/08/19 1620  BP: (!) 101/52 (!) 112/56 (!) 120/55 129/68  Pulse: 78 73 73 98  Resp:   16   Temp:   (!) 97.4 F (36.3 C)   TempSrc:   Oral   SpO2:   98%   Weight:   53.1 kg   Height:        Intake/Output Summary (Last 24 hours) at 08/08/2019 1648 Last data filed at  08/08/2019 1530 Gross per 24 hour  Intake 120 ml  Output 2334 ml  Net -2214 ml   Filed Weights   08/08/19 0615 08/08/19 1120 08/08/19 1530  Weight: 117 kg 55.4 kg 53.1 kg    Examination:  General: 83 y.o. female resting in bed in NAD Cardiovascular: RRR, +S1, S2, no m/g/r, equal pulses throughout Respiratory: CTABL, no w/r/r, normal WOB GI: BS+, NDNT, no masses noted, no organomegaly noted MSK: No c/c; LUE swelling improving Neuro: A&O x 3, no focal deficits Psyc: Appropriate interaction and affect, calm/cooperative   Data Reviewed: I have personally reviewed following labs and imaging studies.  CBC: Recent Labs  Lab 08/02/19 2149 08/03/19 0328  08/05/19 1115 08/06/19 0719 08/07/19 0311 08/07/19 1534 08/08/19 0227  WBC 5.0 5.7   < > 5.5 7.1 5.1 4.4 6.4  NEUTROABS 4.2 4.4  --   --   --   --   --   --   HGB 7.3* 6.2*   < > 10.4* 9.1* 9.0* 10.7* 8.4*  HCT 22.6*  18.5*   < > 30.2* 27.3* 26.4* 31.9* 25.4*  MCV 102.3* 96.9   < > 93.5 94.1 94.0 93.5 95.1  PLT 322 274   < > 132* 129* 105* 109* 103*   < > = values in this interval not displayed.   Basic Metabolic Panel: Recent Labs  Lab 08/05/19 0225 08/05/19 1038 08/05/19 1115 08/06/19 0719 08/07/19 1534 08/08/19 0227  NA  --  139 134* 139 136 136  K  --  3.5 3.0* 3.8 4.2 3.5  CL  --  102 97* 104 103 104  CO2  --  22 26 25  21* 23  GLUCOSE  --  116* 104* 103* 247* 153*  BUN  --  75* <5* 54* 33* 44*  CREATININE  --  4.85* 0.43* 4.17* 3.49* 3.95*  CALCIUM  --  6.1* 7.4* 6.4* 7.3* 6.9*  PHOS 5.6* 5.1*  --  4.7* 4.4 4.4   GFR: Estimated Creatinine Clearance: 9 mL/min (A) (by C-G formula based on SCr of 3.95 mg/dL (H)). Liver Function Tests: Recent Labs  Lab 08/02/19 2149 08/04/19 0754 08/05/19 1038 08/06/19 0719 08/07/19 1534 08/08/19 0227  AST 20 14*  --   --   --   --   ALT 26 17  --   --   --   --   ALKPHOS 51 38  --   --   --   --   BILITOT 0.4 0.2*  --   --   --   --   PROT 6.0* 4.4*  --   --   --   --   ALBUMIN 2.6* 1.8* 1.9* 1.8* 1.9* 1.6*   No results for input(s): LIPASE, AMYLASE in the last 168 hours. No results for input(s): AMMONIA in the last 168 hours. Coagulation Profile: Recent Labs  Lab 08/03/19 2155 08/07/19 0311  INR 1.0 1.0   Cardiac Enzymes: No results for input(s): CKTOTAL, CKMB, CKMBINDEX, TROPONINI in the last 168 hours. BNP (last 3 results) No results for input(s): PROBNP in the last 8760 hours. HbA1C: No results for input(s): HGBA1C in the last 72 hours. CBG: Recent Labs  Lab 08/07/19 1543 08/07/19 2216 08/08/19 0559 08/08/19 0845 08/08/19 1631  GLUCAP 274* 153* 107* 92 170*   Lipid Profile: No results for input(s): CHOL, HDL, LDLCALC, TRIG, CHOLHDL, LDLDIRECT in the last 72 hours. Thyroid Function Tests: No results for input(s): TSH, T4TOTAL, FREET4, T3FREE, THYROIDAB in the last  72 hours. Anemia Panel: No results for input(s):  VITAMINB12, FOLATE, FERRITIN, TIBC, IRON, RETICCTPCT in the last 72 hours. Sepsis Labs: Recent Labs  Lab 08/02/19 2149 08/03/19 0328  LATICACIDVEN 2.3*   4.3* 1.2    Recent Results (from the past 240 hour(s))  Blood culture (routine x 2)     Status: None   Collection Time: 08/02/19  9:49 PM   Specimen: BLOOD  Result Value Ref Range Status   Specimen Description   Final    BLOOD RIGHT ANTECUBITAL Performed at [facility] [address] [place], [place] [address]    Special Requests   Final    BOTTLES DRAWN AEROBIC AND ANAEROBIC Blood Culture adequate volume Performed at [facility] [address], [place], [place] [address]    Culture   Final    NO GROWTH 5 DAYS Performed at [facility], [place] [address], [place], [place] [address]    Report Status 08/08/2019 FINAL  Final  Blood culture (routine x 2)     Status: None   Collection Time: 08/02/19  9:49 PM   Specimen: BLOOD  Result Value Ref Range Status   Specimen Description   Final    BLOOD RIGHT ANTECUBITAL Performed at [facility] [address] [place], [place] [address]    Special Requests   Final    BOTTLES DRAWN AEROBIC AND ANAEROBIC Blood Culture results may not be optimal due to an inadequate volume of blood received in culture bottles Performed at [facility] [address], [place], [place] [address]    Culture   Final    NO GROWTH 5 DAYS Performed at [facility], [place] [address], [place], [place] [address]    Report Status 08/08/2019 FINAL  Final  SARS CORONAVIRUS 2 (TAT 6-24 HRS) Nasopharyngeal Nasal Mucosa     Status: None   Collection Time: 08/03/19  2:40 AM   Specimen: Nasal Mucosa; Nasopharyngeal  Result Value Ref Range Status   SARS Coronavirus 2 NEGATIVE NEGATIVE Final    Comment: (NOTE) SARS-CoV-2 target nucleic acids are NOT DETECTED. The SARS-CoV-2 RNA is generally detectable in upper and  lower respiratory specimens during the acute phase of infection. Negative results do not preclude SARS-CoV-2 infection, do not rule out co-infections with other pathogens, and should not be used as the sole basis for treatment or other patient management decisions. Negative results must be combined with clinical observations, patient history, and epidemiological information. The expected result is Negative. Fact Sheet for Patients: SugarRoll.be Fact Sheet for Healthcare Providers: https://www.woods-mathews.com/ This test is not yet approved or cleared by the [place] FDA and  has been authorized for detection and/or diagnosis of SARS-CoV-2 by FDA under an Emergency Use Authorization (EUA). This EUA will remain  in effect (meaning this test can be used) for the duration of the COVID-19 declaration under Section 56 4(b)(1) of the Act, 21 U.S.C. section 360bbb-3(b)(1), unless the authorization is terminated or revoked sooner. Performed at [facility], [place] [address], [place], [place] [address]   MRSA PCR Screening     Status: None   Collection Time: 08/03/19  2:40 AM  Result Value Ref Range Status   MRSA by PCR NEGATIVE NEGATIVE Final    Comment:        The GeneXpert MRSA Assay (FDA approved for NASAL specimens only), is one component of a comprehensive MRSA colonization surveillance program. It is not intended to diagnose  MRSA infection nor to guide or monitor treatment for MRSA infections. Performed at [facility], [place] [address] [place], [place] [address]       Radiology Studies: US Biopsy (kidney)  Result Date: 08/07/2019 INDICATION: Acute kidney injury and need for renal biopsy. EXAM: ULTRASOUND GUIDED CORE BIOPSY OF LEFT KIDNEY MEDICATIONS: None. ANESTHESIA/SEDATION: Fentanyl 50 mcg IV; Versed 0.5 mg IV Moderate Sedation Time:  15 minutes. The patient was continuously monitored during the procedure by the  interventional radiology nurse under my direct supervision. PROCEDURE: The procedure, risks, benefits, and alternatives were explained to the patient. Questions regarding the procedure were encouraged and answered. The patient understands and consents to the procedure. Ultrasound was performed to localize the kidneys in a prone position. The left flank region was prepped with chlorhexidine in a sterile fashion, and a sterile drape was applied covering the operative field. A sterile gown and sterile gloves were used for the procedure. Local anesthesia was provided with 1% Lidocaine. Under direct ultrasound guidance, 2 separate 16 gauge core biopsy samples were obtained through lower pole cortex of the left kidney. Solid tissue was obtained and submitted in saline. Additional ultrasound was performed. COMPLICATIONS: None immediate. FINDINGS: Both kidneys are extremely echogenic by ultrasound. The left renal cortex was better visualized than the right by ultrasound and therefore was chosen for biopsy. Postprocedural ultrasound shows no evidence of immediate hemorrhage. IMPRESSION: Ultrasound-guided core biopsy performed of the left kidney at the level of lower pole cortex. Electronically Signed   By: Aletta Edouard M.D.   On: 08/07/2019 13:21   Vas [place] Upper Ext Vein Mapping (pre-op Avf)  Result Date: 08/07/2019 UPPER EXTREMITY VEIN MAPPING  Indications: Pre-access. Comparison Study: no prior Performing Technologist: Abram Sander RVS  Examination Guidelines: A complete evaluation includes B-mode imaging, spectral Doppler, color Doppler, and power Doppler as needed of all accessible portions of each vessel. Bilateral testing is considered an integral part of a complete examination. Limited examinations for reoccurring indications may be performed as noted. +-----------------+-------------+----------+--------+  Right Cephalic    Diameter (cm) Depth (cm) Findings  +-----------------+-------------+----------+--------+   Shoulder              0.21         0.74              +-----------------+-------------+----------+--------+  Prox upper arm        0.19         0.58              +-----------------+-------------+----------+--------+  Mid upper arm         0.19         0.46              +-----------------+-------------+----------+--------+  Dist upper arm        0.25         0.30              +-----------------+-------------+----------+--------+  Antecubital fossa     0.29         0.42              +-----------------+-------------+----------+--------+  Prox forearm          0.23         0.20              +-----------------+-------------+----------+--------+  Mid forearm           0.22         0.27              +-----------------+-------------+----------+--------+  Dist forearm          0.17                           +-----------------+-------------+----------+--------+ +-----------------+-------------+----------+--------+  Right Basilic     Diameter (cm) Depth (cm) Findings  +-----------------+-------------+----------+--------+  Prox upper arm        0.30         1.03              +-----------------+-------------+----------+--------+  Mid upper arm         0.29         1.25              +-----------------+-------------+----------+--------+  Dist upper arm        0.27         1.39              +-----------------+-------------+----------+--------+  Antecubital fossa     0.29         1.21              +-----------------+-------------+----------+--------+  Prox forearm          0.20         0.74              +-----------------+-------------+----------+--------+  Mid forearm           0.17         0.55              +-----------------+-------------+----------+--------+  Distal forearm        0.15         0.29              +-----------------+-------------+----------+--------+ +-----------------+-------------+----------+----------------------------+  Left Cephalic     Diameter (cm) Depth (cm)           Findings             +-----------------+-------------+----------+----------------------------+  Shoulder              0.26         0.99                                  +-----------------+-------------+----------+----------------------------+  Prox upper arm        0.40         0.76                                  +-----------------+-------------+----------+----------------------------+  Mid upper arm         0.42         0.69                                  +-----------------+-------------+----------+----------------------------+  Dist upper arm        0.42         0.59                                  +-----------------+-------------+----------+----------------------------+  Antecubital fossa     0.51         0.30                                  +-----------------+-------------+----------+----------------------------+  Prox forearm          0.17         0.92                                  +-----------------+-------------+----------+----------------------------+  Mid forearm           0.17         0.68             branching            +-----------------+-------------+----------+----------------------------+  Dist forearm                                      not visualized         +-----------------+-------------+----------+----------------------------+  Wrist                                      branching and not visualized  +-----------------+-------------+----------+----------------------------+ +-----------------+-------------+----------+--------------+  Left Basilic      Diameter (cm) Depth (cm)    Findings     +-----------------+-------------+----------+--------------+  Shoulder                                   not visualized  +-----------------+-------------+----------+--------------+  Prox upper arm                             not visualized  +-----------------+-------------+----------+--------------+  Mid upper arm                              not visualized  +-----------------+-------------+----------+--------------+   Dist upper arm                             not visualized  +-----------------+-------------+----------+--------------+  Antecubital fossa                          not visualized  +-----------------+-------------+----------+--------------+  Prox forearm                               not visualized  +-----------------+-------------+----------+--------------+  Mid forearm                                not visualized  +-----------------+-------------+----------+--------------+  Distal forearm                             not visualized  +-----------------+-------------+----------+--------------+  Elbow                                      not visualized  +-----------------+-------------+----------+--------------+  Wrist                                      not visualized  +-----------------+-------------+----------+--------------+ *See  table(s) above for measurements and observations.  Diagnosing physician: Deitra Mayo MD Electronically signed by Deitra Mayo MD on 08/07/2019 at 3:08:52 PM.    Final      Scheduled Meds:  amLODipine  10 mg Oral Daily   atorvastatin  20 mg Oral Daily   calcitRIOL  0.25 mcg Oral Daily   carvedilol  6.25 mg Oral BID WC   Chlorhexidine Gluconate Cloth  6 each Topical Q0600   Chlorhexidine Gluconate Cloth  6 each Topical Q0600   heparin       hydrALAZINE  10 mg Oral Q8H   insulin aspart  0-9 Units Subcutaneous TID WC   pantoprazole  40 mg Oral BID   predniSONE  40 mg Oral Q breakfast   sodium chloride flush  3 mL Intravenous Q12H   sodium chloride flush  3 mL Intravenous Q12H   Continuous Infusions:  sodium chloride     cefTRIAXone (ROCEPHIN)  IV 1 g (08/07/19 2201)     LOS: 6 days    Time spent: 25 minutes spent in the coordination of care today.    Jonnie Finner, DO [facility] Pager [phone]  If 7PM-7AM, please contact night-coverage [email or web address] Password [facility] 08/08/2019, 4:48 PM

## 2019-08-08 NOTE — Progress Notes (Signed)
Subjective:  No acute events overnight. Patient feeling well this morning. No chest pain, shortness of breath, n/v, changes in stools.    Objective Vital signs in last 24 hours: Vitals:   08/07/19 1615 08/07/19 2215 08/08/19 0547 08/08/19 0615  BP: 129/62 (!) 125/54 (!) 115/58   Pulse: 87 76 81   Resp:  15 16   Temp:  98.1 F (36.7 C) 98.7 F (37.1 C)   TempSrc:  Oral Oral   SpO2:  99% 99%   Weight:    117 kg  Height:       Weight change: 62.2 kg  Intake/Output Summary (Last 24 hours) at 08/08/2019 0850 Last data filed at 08/07/2019 1323 Gross per 24 hour  Intake 240 ml  Output -  Net 240 ml    Assessment/ Plan: 1. Acute kidney injury on chronic kidney disease stage SW:[id number] differential diagnosis include progression due to longstanding HTN, acute interstitial nephritis, ATN and very less likely infection associated GN. Urinalysis is bland, arguing against interstitial nephritis and glomerulonephritis. No evidence of obstruction on ultrasound. She was initiated on IHD on 10/23 for uremia and has done well. She is s/p renal biopsy on 84/66 without complications. Should have results back by Friday. Will continue IHD today as scheduled.  Appreciate VVS consult. Plan for permanent access on 10/30.   2. Volume overload:improved with HD. Down 11 kg from admission.  3. Anion gap metabolic acidosis:Secondary to acute kidney injury and mild lactic acidosis, resolved with dialysis.   4. Cellulitis of left upper extremity: Superimposed on chronic lymphedema following ipsilateral mastectomy/lymph node dissection. On ceftriaxone and status post vancomycin- she has not shown any signs of vancomycin toxicity.   5. Anemia:Not clearly related to iron deficiency. Monitor for signs of bleeding.  6.Hypertension: blood pressure improved with transition from lopressor carvedilol 6.25 mg BID.   7. Metabolic bone disease.Continue Calcitriol to treat hypocalcemia.   Delice Bison    Labs: Basic Metabolic Panel: Recent Labs  Lab 08/06/19 0719 08/07/19 1534 08/08/19 0227  NA 139 136 136  K 3.8 4.2 3.5  CL 104 103 104  CO2 25 21* 23  GLUCOSE 103* 247* 153*  BUN 54* 33* 44*  CREATININE 4.17* 3.49* 3.95*  CALCIUM 6.4* 7.3* 6.9*  PHOS 4.7* 4.4 4.4   Liver Function Tests: Recent Labs  Lab 08/02/19 2149 08/04/19 0754  08/06/19 0719 08/07/19 1534 08/08/19 0227  AST 20 14*  --   --   --   --   ALT 26 17  --   --   --   --   ALKPHOS 51 38  --   --   --   --   BILITOT 0.4 0.2*  --   --   --   --   PROT 6.0* 4.4*  --   --   --   --   ALBUMIN 2.6* 1.8*   < > 1.8* 1.9* 1.6*   < > = values in this interval not displayed.   No results for input(s): LIPASE, AMYLASE in the last 168 hours. No results for input(s): AMMONIA in the last 168 hours. CBC: Recent Labs  Lab 08/02/19 2149 08/03/19 0328  08/05/19 1115 08/06/19 0719 08/07/19 0311 08/07/19 1534 08/08/19 0227  WBC 5.0 5.7   < > 5.5 7.1 5.1 4.4 6.4  NEUTROABS 4.2 4.4  --   --   --   --   --   --   HGB 7.3* 6.2*   < >  10.4* 9.1* 9.0* 10.7* 8.4*  HCT 22.6* 18.5*   < > 30.2* 27.3* 26.4* 31.9* 25.4*  MCV 102.3* 96.9   < > 93.5 94.1 94.0 93.5 95.1  PLT 322 274   < > 132* 129* 105* 109* 103*   < > = values in this interval not displayed.   Cardiac Enzymes: No results for input(s): CKTOTAL, CKMB, CKMBINDEX, TROPONINI in the last 168 hours. CBG: Recent Labs  Lab 08/07/19 1136 08/07/19 1543 08/07/19 2216 08/08/19 0559 08/08/19 0845  GLUCAP 110* 274* 153* 107* 92    Iron Studies: No results for input(s): IRON, TIBC, TRANSFERRIN, FERRITIN in the last 72 hours. Studies/Results: US Biopsy (kidney)  Result Date: 08/07/2019 INDICATION: Acute kidney injury and need for renal biopsy. EXAM: ULTRASOUND GUIDED CORE BIOPSY OF LEFT KIDNEY MEDICATIONS: None. ANESTHESIA/SEDATION: Fentanyl 50 mcg IV; Versed 0.5 mg IV Moderate Sedation Time:  15 minutes. The patient was continuously monitored  during the procedure by the interventional radiology nurse under my direct supervision. PROCEDURE: The procedure, risks, benefits, and alternatives were explained to the patient. Questions regarding the procedure were encouraged and answered. The patient understands and consents to the procedure. Ultrasound was performed to localize the kidneys in a prone position. The left flank region was prepped with chlorhexidine in a sterile fashion, and a sterile drape was applied covering the operative field. A sterile gown and sterile gloves were used for the procedure. Local anesthesia was provided with 1% Lidocaine. Under direct ultrasound guidance, 2 separate 16 gauge core biopsy samples were obtained through lower pole cortex of the left kidney. Solid tissue was obtained and submitted in saline. Additional ultrasound was performed. COMPLICATIONS: None immediate. FINDINGS: Both kidneys are extremely echogenic by ultrasound. The left renal cortex was better visualized than the right by ultrasound and therefore was chosen for biopsy. Postprocedural ultrasound shows no evidence of immediate hemorrhage. IMPRESSION: Ultrasound-guided core biopsy performed of the left kidney at the level of lower pole cortex. Electronically Signed   By: Aletta Edouard M.D.   On: 08/07/2019 13:21   Vas [place] Upper Ext Vein Mapping (pre-op Avf)  Result Date: 08/07/2019 UPPER EXTREMITY VEIN MAPPING  Indications: Pre-access. Comparison Study: no prior Performing Technologist: Abram Sander RVS  Examination Guidelines: A complete evaluation includes B-mode imaging, spectral Doppler, color Doppler, and power Doppler as needed of all accessible portions of each vessel. Bilateral testing is considered an integral part of a complete examination. Limited examinations for reoccurring indications may be performed as noted. +-----------------+-------------+----------+--------+ Right Cephalic   Diameter (cm)Depth (cm)Findings  +-----------------+-------------+----------+--------+ Shoulder             0.21        0.74            +-----------------+-------------+----------+--------+ Prox upper arm       0.19        0.58            +-----------------+-------------+----------+--------+ Mid upper arm        0.19        0.46            +-----------------+-------------+----------+--------+ Dist upper arm       0.25        0.30            +-----------------+-------------+----------+--------+ Antecubital fossa    0.29        0.42            +-----------------+-------------+----------+--------+ Prox forearm         0.23  0.20            +-----------------+-------------+----------+--------+ Mid forearm          0.22        0.27            +-----------------+-------------+----------+--------+ Dist forearm         0.17                        +-----------------+-------------+----------+--------+ +-----------------+-------------+----------+--------+ Right Basilic    Diameter (cm)Depth (cm)Findings +-----------------+-------------+----------+--------+ Prox upper arm       0.30        1.03            +-----------------+-------------+----------+--------+ Mid upper arm        0.29        1.25            +-----------------+-------------+----------+--------+ Dist upper arm       0.27        1.39            +-----------------+-------------+----------+--------+ Antecubital fossa    0.29        1.21            +-----------------+-------------+----------+--------+ Prox forearm         0.20        0.74            +-----------------+-------------+----------+--------+ Mid forearm          0.17        0.55            +-----------------+-------------+----------+--------+ Distal forearm       0.15        0.29            +-----------------+-------------+----------+--------+ +-----------------+-------------+----------+----------------------------+ Left Cephalic    Diameter (cm)Depth  (cm)          Findings           +-----------------+-------------+----------+----------------------------+ Shoulder             0.26        0.99                                +-----------------+-------------+----------+----------------------------+ Prox upper arm       0.40        0.76                                +-----------------+-------------+----------+----------------------------+ Mid upper arm        0.42        0.69                                +-----------------+-------------+----------+----------------------------+ Dist upper arm       0.42        0.59                                +-----------------+-------------+----------+----------------------------+ Antecubital fossa    0.51        0.30                                +-----------------+-------------+----------+----------------------------+ Prox forearm         0.17        0.92                                +-----------------+-------------+----------+----------------------------+  Mid forearm          0.17        0.68            branching           +-----------------+-------------+----------+----------------------------+ Dist forearm                                   not visualized        +-----------------+-------------+----------+----------------------------+ Wrist                                   branching and not visualized +-----------------+-------------+----------+----------------------------+ +-----------------+-------------+----------+--------------+ Left Basilic     Diameter (cm)Depth (cm)   Findings    +-----------------+-------------+----------+--------------+ Shoulder                                not visualized +-----------------+-------------+----------+--------------+ Prox upper arm                          not visualized +-----------------+-------------+----------+--------------+ Mid upper arm                           not visualized  +-----------------+-------------+----------+--------------+ Dist upper arm                          not visualized +-----------------+-------------+----------+--------------+ Antecubital fossa                       not visualized +-----------------+-------------+----------+--------------+ Prox forearm                            not visualized +-----------------+-------------+----------+--------------+ Mid forearm                             not visualized +-----------------+-------------+----------+--------------+ Distal forearm                          not visualized +-----------------+-------------+----------+--------------+ Elbow                                   not visualized +-----------------+-------------+----------+--------------+ Wrist                                   not visualized +-----------------+-------------+----------+--------------+ *See table(s) above for measurements and observations.  Diagnosing physician: Deitra Mayo MD Electronically signed by Deitra Mayo MD on 08/07/2019 at 3:08:52 PM.    Final    Medications: Infusions: . sodium chloride    . sodium chloride    . sodium chloride    . cefTRIAXone (ROCEPHIN)  IV 1 g (08/07/19 2201)    Scheduled Medications: . amLODipine  10 mg Oral Daily  . atorvastatin  20 mg Oral Daily  . calcitRIOL  0.25 mcg Oral Daily  . carvedilol  6.25 mg Oral BID WC  . Chlorhexidine Gluconate Cloth  6 each Topical Q0600  . Chlorhexidine Gluconate Cloth  6 each Topical Q0600  . hydrALAZINE  10 mg Oral Q8H  .  insulin aspart  0-9 Units Subcutaneous TID WC  . pantoprazole  40 mg Oral BID  . predniSONE  40 mg Oral Q breakfast  . sodium chloride flush  3 mL Intravenous Q12H  . sodium chloride flush  3 mL Intravenous Q12H    have reviewed scheduled and prn medications.  Physical Exam: General: awake, alert, pleasant female sitting up in bed in NAD Heart: RRR Lungs: CTAB Extremities:  Chronic LUE  lymphedema; 1+ pitting edema around ankles  Neuro: A&Ox3; no focal deficits    08/08/2019,8:50 AM  LOS: 6 days

## 2019-08-09 LAB — CBC
HCT: 24 % — ABNORMAL LOW (ref 36.0–46.0)
Hemoglobin: 7.9 g/dL — ABNORMAL LOW (ref 12.0–15.0)
MCH: 31.3 pg (ref 26.0–34.0)
MCHC: 32.9 g/dL (ref 30.0–36.0)
MCV: 95.2 fL (ref 80.0–100.0)
Platelets: 104 10*3/uL — ABNORMAL LOW (ref 150–400)
RBC: 2.52 MIL/uL — ABNORMAL LOW (ref 3.87–5.11)
RDW: 16.8 % — ABNORMAL HIGH (ref 11.5–15.5)
WBC: 6.3 10*3/uL (ref 4.0–10.5)
nRBC: 0 % (ref 0.0–0.2)

## 2019-08-09 LAB — GLUCOSE, CAPILLARY
Glucose-Capillary: 104 mg/dL — ABNORMAL HIGH (ref 70–99)
Glucose-Capillary: 173 mg/dL — ABNORMAL HIGH (ref 70–99)
Glucose-Capillary: 255 mg/dL — ABNORMAL HIGH (ref 70–99)
Glucose-Capillary: 137 mg/dL — ABNORMAL HIGH (ref 70–99)

## 2019-08-09 LAB — RENAL FUNCTION PANEL
Albumin: 1.7 g/dL — ABNORMAL LOW (ref 3.5–5.0)
Anion gap: 8 (ref 5–15)
BUN: 23 mg/dL (ref 8–23)
CO2: 26 mmol/L (ref 22–32)
Calcium: 6.9 mg/dL — ABNORMAL LOW (ref 8.9–10.3)
Chloride: 103 mmol/L (ref 98–111)
Creatinine, Ser: 2.3 mg/dL — ABNORMAL HIGH (ref 0.44–1.00)
GFR calc Af Amer: 23 mL/min — ABNORMAL LOW (ref >60)
GFR calc non Af Amer: 19 mL/min — ABNORMAL LOW (ref >60)
Glucose, Bld: 141 mg/dL — ABNORMAL HIGH (ref 70–99)
Phosphorus: 3.2 mg/dL (ref 2.5–4.6)
Potassium: 3.2 mmol/L — ABNORMAL LOW (ref 3.5–5.1)
Sodium: 137 mmol/L (ref 135–145)

## 2019-08-09 LAB — MAGNESIUM: Magnesium: 1.4 mg/dL — ABNORMAL LOW (ref 1.7–2.4)

## 2019-08-09 MED ORDER — MAGNESIUM SULFATE 2 GM/50ML IV SOLN
2.0000 g | Freq: Once | INTRAVENOUS | Status: AC
  Administered 2019-08-09: 2 g via INTRAVENOUS
  Filled 2019-08-09: qty 50

## 2019-08-09 MED ORDER — POTASSIUM CHLORIDE CRYS ER 20 MEQ PO TBCR
20.0000 meq | EXTENDED_RELEASE_TABLET | Freq: Two times a day (BID) | ORAL | Status: DC
  Administered 2019-08-09 – 2019-08-10 (×3): 20 meq via ORAL
  Filled 2019-08-09 (×3): qty 1

## 2019-08-09 MED ORDER — CHLORHEXIDINE GLUCONATE CLOTH 2 % EX PADS
6.0000 | MEDICATED_PAD | Freq: Every day | CUTANEOUS | Status: DC
  Administered 2019-08-10: 6 via TOPICAL

## 2019-08-09 NOTE — Progress Notes (Signed)
Patient has been accepted for OP HD treatment on a TTS schedule with a seat time of 11:00am. She needs to report to the clinic 20 minutes early to her appointments.  Renal Navigator notes that patient has plan for access surgery tomorrow, Friday, 08/10/19 and spoke with Nephrology and Attending regarding possibility of discharge afterwards. If patient is medically stable for discharge after surgery and has time to report to the clinic on Friday, she can sign her consent forms at the clinic in preparation for start in the clinic on Saturday. If time does not allow for consent signing on Friday, she needs to report to the clinic on Saturday at 10:00am to complete consent paperwork prior to her first OP HD treatment. This was discussed with patient and she states understanding. Patient is very alert and conversant with Navigator today and written information was left with her in addition to discussing information verbally. Patient is very eager to go home. OP HD clinic is prepared for patient to start on Saturday, 08/11/19. If she is not discharged on Friday, she can start in the clinic on Tuesday, 08/14/19. Renal Navigator will continue to follow closely to assist with smooth discharge from hospital to clinic.  Alphonzo Cruise, [place] Renal Navigator [phone]

## 2019-08-09 NOTE — Plan of Care (Signed)
  Problem: Education: Goal: Knowledge of General Education information will improve Description: Including pain rating scale, medication(s)/side effects and non-pharmacologic comfort measures Outcome: Progressing   Problem: Clinical Measurements: Goal: Will remain free from infection Outcome: Progressing   

## 2019-08-09 NOTE — Progress Notes (Signed)
[facility]  PROGRESS NOTE    Samantha Mckenzie  PHK:[id number] DOB: 1936/04/09 DOA: 08/02/2019 PCP: Colon Branch, MD   Brief Narrative:   83 year old with past medical history significant for depression, breast cancer status post left mastectomy/chemotherapy radiation, anemia Crohn's ileitis, CAD and CKD stage IV who presents to the emergency department for evaluation of progressive left arm swelling and bilateral lower extremity edema. Patient reports occasional left arm swelling since her left mastectomy 20 years ago, but this has been much worse in recent weeks, also report redness. She was treated as an outpatient with 2 courses of antibiotics without significant improvement. She denies hematochezia melena.  10/28: Denies complaints. Hgb down a little today. To go to HD today.  10/29: Reports that she's feeling better today. Excited to possibly go home tomorrow.   Assessment & Plan:   Principal Problem:   Acute renal failure superimposed on stage 4 chronic kidney disease (HCC) Active Problems:   Diabetes mellitus type 2 with complications (HCC)   Macrocytic anemia   Essential hypertension   Cellulitis of arm, left   Acidosis   Acute kidney injuries superimposed on chronic kidney disease a stage IV Metabolic acidosis hyperkalemia:     - Patient presented with a BUN 108 and creatinine at 6.  Earlier this month creatinine 3.7     - npehro onboard; now on iHD     - s/p renal Bx, awaiting results     - Vascular consulted, for evaluation for AV fistula or graft. Plan for Right arm access on Friday.      - 10/29: looks like she's set for AV fistula tomorrow; [facility] has secured her a outpt HD bed; possible d/c to home after procedure tomorrow  Left arm cellulitis: Cellulitis superimposed on lymphedema.     - ceftriaxone; END DATE 10/29     - elevate of the arm.     - [place] negative for DVT.      - supposed to be wearing arm compression at home, but non-compliant      - 10/29: continues to  improve; d/c abx today  Normocytic anemia, multifactoria     - No active bleeding.     - She report black stool but she is taking oral iron.     - FOBT positive     - GI has seen; no acute intevention at this timel rec continuing steroids for her Crohns     - s/p 2 units pRBCs     - on PPT     - monitor Hgb     - 10/29: Hgb is about 8 this AM; continue monitor  DM2     - Continue sliding scale insulin.  Hypertension:     - metoprolol, norvasc  CAD     - coreg, atorvastatin  Crohn's disease:     - prednisone.       - PRN Imodium  Continue to watch Hgb. Fistula tomorrow, then hopefully home.   DVT prophylaxis: SCDs Code Status: DNR Family Communication: To son at bedside   Disposition Plan: Home after fistula  Consultants:   Nephrology  GI  Vascular surgery  Antimicrobials:   Rocephin   ROS:  Denies CP, dyspnea, HA, N, V. Remainder 10-pt ROS is negative for all not previously mentioned.  Subjective: "I'm getting to go tomorrow!"  Objective: Vitals:   08/09/19 0538 08/09/19 0657 08/09/19 0914 08/09/19 1317  BP: (!) 128/56  (!) 125/52 (!) 127/59  Pulse: 80  82  85  Resp: 16   18  Temp: 98.6 F (37 C)   98.4 F (36.9 C)  TempSrc: Oral   Oral  SpO2: 99%   98%  Weight:  118 kg    Height:        Intake/Output Summary (Last 24 hours) at 08/09/2019 1406 Last data filed at 08/08/2019 1530 Gross per 24 hour  Intake --  Output 2334 ml  Net -2334 ml   Filed Weights   08/08/19 1120 08/08/19 1530 08/09/19 0657  Weight: 55.4 kg 53.1 kg 118 kg    Examination:  General: 83 y.o. female resting in bed in NAD Cardiovascular: RRR, +S1, S2, no m/g/r Respiratory: CTABL, no w/r/r GI: BS+, NDNT, no masses noted MSK: No c/c; LUE swelling improving, less erythema Neuro: A&O x 3, no focal deficits Psyc: Appropriate interaction and affect, calm/cooperative   Data Reviewed: I have personally reviewed following labs and imaging studies.  CBC: Recent Labs   Lab Aug 03, 2019 2149 08/03/19 0328  08/06/19 0719 08/07/19 0311 08/07/19 1534 08/08/19 0227 08/09/19 0239  WBC 5.0 5.7   < > 7.1 5.1 4.4 6.4 6.3  NEUTROABS 4.2 4.4  --   --   --   --   --   --   HGB 7.3* 6.2*   < > 9.1* 9.0* 10.7* 8.4* 7.9*  HCT 22.6* 18.5*   < > 27.3* 26.4* 31.9* 25.4* 24.0*  MCV 102.3* 96.9   < > 94.1 94.0 93.5 95.1 95.2  PLT 322 274   < > 129* 105* 109* 103* 104*   < > = values in this interval not displayed.   Basic Metabolic Panel: Recent Labs  Lab 08/05/19 1038 08/05/19 1115 08/06/19 0719 08/07/19 1534 08/08/19 0227 08/09/19 0239  NA 139 134* 139 136 136 137  K 3.5 3.0* 3.8 4.2 3.5 3.2*  CL 102 97* 104 103 104 103  CO2 22 26 25  21* 23 26  GLUCOSE 116* 104* 103* 247* 153* 141*  BUN 75* <5* 54* 33* 44* 23  CREATININE 4.85* 0.43* 4.17* 3.49* 3.95* 2.30*  CALCIUM 6.1* 7.4* 6.4* 7.3* 6.9* 6.9*  MG  --   --   --   --   --  1.4*  PHOS 5.1*  --  4.7* 4.4 4.4 3.2   GFR: Estimated Creatinine Clearance: 23.8 mL/min (A) (by C-G formula based on SCr of 2.3 mg/dL (H)). Liver Function Tests: Recent Labs  Lab 08-03-19 2149 08/04/19 0754 08/05/19 1038 08/06/19 0719 08/07/19 1534 08/08/19 0227 08/09/19 0239  AST 20 14*  --   --   --   --   --   ALT 26 17  --   --   --   --   --   ALKPHOS 51 38  --   --   --   --   --   BILITOT 0.4 0.2*  --   --   --   --   --   PROT 6.0* 4.4*  --   --   --   --   --   ALBUMIN 2.6* 1.8* 1.9* 1.8* 1.9* 1.6* 1.7*   No results for input(s): LIPASE, AMYLASE in the last 168 hours. No results for input(s): AMMONIA in the last 168 hours. Coagulation Profile: Recent Labs  Lab 08/03/19 2155 08/07/19 0311  INR 1.0 1.0   Cardiac Enzymes: No results for input(s): CKTOTAL, CKMB, CKMBINDEX, TROPONINI in the last 168 hours. BNP (last 3 results) No results for input(s): PROBNP  in the last 8760 hours. HbA1C: No results for input(s): HGBA1C in the last 72 hours. CBG: Recent Labs  Lab 08/08/19 0845 08/08/19 1631  08/08/19 2118 08/09/19 0811 08/09/19 1143  GLUCAP 92 170* 254* 104* 173*   Lipid Profile: No results for input(s): CHOL, HDL, LDLCALC, TRIG, CHOLHDL, LDLDIRECT in the last 72 hours. Thyroid Function Tests: No results for input(s): TSH, T4TOTAL, FREET4, T3FREE, THYROIDAB in the last 72 hours. Anemia Panel: No results for input(s): VITAMINB12, FOLATE, FERRITIN, TIBC, IRON, RETICCTPCT in the last 72 hours. Sepsis Labs: Recent Labs  Lab 08/02/19 2149 08/03/19 0328  LATICACIDVEN 2.3*   4.3* 1.2    Recent Results (from the past 240 hour(s))  Blood culture (routine x 2)     Status: None   Collection Time: 08/02/19  9:49 PM   Specimen: BLOOD  Result Value Ref Range Status   Specimen Description   Final    BLOOD RIGHT ANTECUBITAL Performed at [facility] [address], [place], [place] [address]    Special Requests   Final    BOTTLES DRAWN AEROBIC AND ANAEROBIC Blood Culture adequate volume Performed at [facility] [address] [place], [place] [address]    Culture   Final    NO GROWTH 5 DAYS Performed at [facility], [place] [address], [place], [place] [address]    Report Status 08/08/2019 FINAL  Final  Blood culture (routine x 2)     Status: None   Collection Time: 08/02/19  9:49 PM   Specimen: BLOOD  Result Value Ref Range Status   Specimen Description   Final    BLOOD RIGHT ANTECUBITAL Performed at [facility] [address] [place], [place] [address]    Special Requests   Final    BOTTLES DRAWN AEROBIC AND ANAEROBIC Blood Culture results may not be optimal due to an inadequate volume of blood received in culture bottles Performed at [facility] [address], [place], [place] [address]    Culture   Final    NO GROWTH 5 DAYS Performed at [facility], [place] [address], [place], [place] [address]    Report Status 08/08/2019 FINAL  Final  SARS CORONAVIRUS 2 (TAT 6-24 HRS)  Nasopharyngeal Nasal Mucosa     Status: None   Collection Time: 08/03/19  2:40 AM   Specimen: Nasal Mucosa; Nasopharyngeal  Result Value Ref Range Status   SARS Coronavirus 2 NEGATIVE NEGATIVE Final    Comment: (NOTE) SARS-CoV-2 target nucleic acids are NOT DETECTED. The SARS-CoV-2 RNA is generally detectable in upper and lower respiratory specimens during the acute phase of infection. Negative results do not preclude SARS-CoV-2 infection, do not rule out co-infections with other pathogens, and should not be used as the sole basis for treatment or other patient management decisions. Negative results must be combined with clinical observations, patient history, and epidemiological information. The expected result is Negative. Fact Sheet for Patients: SugarRoll.be Fact Sheet for Healthcare Providers: https://www.woods-mathews.com/ This test is not yet approved or cleared by the [place] FDA and  has been authorized for detection and/or diagnosis of SARS-CoV-2 by FDA under an Emergency Use Authorization (EUA). This EUA will remain  in effect (meaning this test can be used) for the duration of the COVID-19 declaration under Section 56 4(b)(1) of the Act, 21 U.S.C. section 360bbb-3(b)(1), unless the authorization is terminated or revoked sooner. Performed at [facility], [place],  [place] [address]   MRSA PCR Screening     Status: None   Collection Time: 08/03/19  2:40 AM  Result Value Ref Range Status   MRSA by PCR NEGATIVE NEGATIVE Final    Comment:        The GeneXpert MRSA Assay (FDA approved for NASAL specimens only), is one component of a comprehensive MRSA colonization surveillance program. It is not intended to diagnose MRSA infection nor to guide or monitor treatment for MRSA infections. Performed at [facility], [place] [address], [place], [place] [address]       Radiology Studies: Vas [place]  Upper Ext Vein Mapping (pre-op Avf)  Result Date: 08/07/2019 UPPER EXTREMITY VEIN MAPPING  Indications: Pre-access. Comparison Study: no prior Performing Technologist: Abram Sander RVS  Examination Guidelines: A complete evaluation includes B-mode imaging, spectral Doppler, color Doppler, and power Doppler as needed of all accessible portions of each vessel. Bilateral testing is considered an integral part of a complete examination. Limited examinations for reoccurring indications may be performed as noted. +-----------------+-------------+----------+--------+  Right Cephalic    Diameter (cm) Depth (cm) Findings  +-----------------+-------------+----------+--------+  Shoulder              0.21         0.74              +-----------------+-------------+----------+--------+  Prox upper arm        0.19         0.58              +-----------------+-------------+----------+--------+  Mid upper arm         0.19         0.46              +-----------------+-------------+----------+--------+  Dist upper arm        0.25         0.30              +-----------------+-------------+----------+--------+  Antecubital fossa     0.29         0.42              +-----------------+-------------+----------+--------+  Prox forearm          0.23         0.20              +-----------------+-------------+----------+--------+  Mid forearm           0.22         0.27              +-----------------+-------------+----------+--------+  Dist forearm          0.17                           +-----------------+-------------+----------+--------+ +-----------------+-------------+----------+--------+  Right Basilic     Diameter (cm) Depth (cm) Findings  +-----------------+-------------+----------+--------+  Prox upper arm        0.30         1.03              +-----------------+-------------+----------+--------+  Mid upper arm         0.29         1.25              +-----------------+-------------+----------+--------+  Dist upper arm        0.27          1.39              +-----------------+-------------+----------+--------+  Antecubital fossa     0.29         1.21              +-----------------+-------------+----------+--------+  Prox forearm          0.20         0.74              +-----------------+-------------+----------+--------+  Mid forearm           0.17         0.55              +-----------------+-------------+----------+--------+  Distal forearm        0.15         0.29              +-----------------+-------------+----------+--------+ +-----------------+-------------+----------+----------------------------+  Left Cephalic     Diameter (cm) Depth (cm)           Findings            +-----------------+-------------+----------+----------------------------+  Shoulder              0.26         0.99                                  +-----------------+-------------+----------+----------------------------+  Prox upper arm        0.40         0.76                                  +-----------------+-------------+----------+----------------------------+  Mid upper arm         0.42         0.69                                  +-----------------+-------------+----------+----------------------------+  Dist upper arm        0.42         0.59                                  +-----------------+-------------+----------+----------------------------+  Antecubital fossa     0.51         0.30                                  +-----------------+-------------+----------+----------------------------+  Prox forearm          0.17         0.92                                  +-----------------+-------------+----------+----------------------------+  Mid forearm           0.17         0.68             branching            +-----------------+-------------+----------+----------------------------+  Dist forearm                                      not visualized         +-----------------+-------------+----------+----------------------------+  Wrist                                       branching and not visualized  +-----------------+-------------+----------+----------------------------+ +-----------------+-------------+----------+--------------+  Left Basilic      Diameter (cm) Depth (cm)    Findings     +-----------------+-------------+----------+--------------+  Shoulder                                   not visualized  +-----------------+-------------+----------+--------------+  Prox upper arm                             not visualized  +-----------------+-------------+----------+--------------+  Mid upper arm                              not visualized  +-----------------+-------------+----------+--------------+  Dist upper arm                             not visualized  +-----------------+-------------+----------+--------------+  Antecubital fossa                          not visualized  +-----------------+-------------+----------+--------------+  Prox forearm                               not visualized  +-----------------+-------------+----------+--------------+  Mid forearm                                not visualized  +-----------------+-------------+----------+--------------+  Distal forearm                             not visualized  +-----------------+-------------+----------+--------------+  Elbow                                      not visualized  +-----------------+-------------+----------+--------------+  Wrist                                      not visualized  +-----------------+-------------+----------+--------------+ *See table(s) above for measurements and observations.  Diagnosing physician: Deitra Mayo MD Electronically signed by Deitra Mayo MD on 08/07/2019 at 3:08:52 PM.    Final      Scheduled Meds:  amLODipine  10 mg Oral Daily   atorvastatin  20 mg Oral Daily   calcitRIOL  0.25 mcg Oral Daily   carvedilol  6.25 mg Oral BID WC   Chlorhexidine Gluconate Cloth  6 each Topical Q0600   Chlorhexidine Gluconate Cloth  6 each  Topical Q0600   Chlorhexidine Gluconate Cloth  6 each Topical Q0600   hydrALAZINE  10 mg Oral Q8H   insulin aspart  0-9 Units Subcutaneous TID WC   pantoprazole  40 mg Oral BID   potassium chloride  20 mEq Oral BID   predniSONE  40 mg Oral Q breakfast   sodium chloride flush  3 mL Intravenous Q12H   sodium chloride  flush  3 mL Intravenous Q12H   Continuous Infusions:  sodium chloride     cefTRIAXone (ROCEPHIN)  IV 1 g (08/08/19 2138)     LOS: 7 days    Time spent: 25 minutes spent in the coordination of care today.   Jonnie Finner, DO [facility] Pager [phone]  If 7PM-7AM, please contact night-coverage [email or web address] Password [email or web address] 08/09/2019, 2:06 PM

## 2019-08-09 NOTE — Progress Notes (Signed)
Subjective:  No acute events overnight. Tolerated HD yesterday. Denies fevers, chills, n/v, abdominal pain, change in stools. Looks forward to being able to go home.    Objective Vital signs in last 24 hours: Vitals:   08/08/19 2116 08/09/19 0538 08/09/19 0657 08/09/19 0914  BP: (!) 117/48 (!) 128/56  (!) 125/52  Pulse: 74 80  82  Resp: 16 16    Temp: 98 F (36.7 C) 98.6 F (37 C)    TempSrc: Oral Oral    SpO2: 98% 99%    Weight:   118 kg   Height:       Weight change: -61.6 kg  Intake/Output Summary (Last 24 hours) at 08/09/2019 1125 Last data filed at 08/08/2019 1530 Gross per 24 hour  Intake -  Output 2334 ml  Net -2334 ml    Assessment/ Plan: 1. Acute kidney injury on chronic kidney disease stage AO:[id number] differential diagnosis includes progression due to longstanding HTN, acute interstitial nephritis, ATN and very less likely infection associated GN. Urinalysis is bland, arguing against interstitial nephritis and glomerulonephritis. No evidence of obstruction on ultrasound. She was initiated on IHD on 10/23 for uremia and has done well. She is s/p renal biopsy on 46/96 without complications. Should have results back by Friday. Labs stable; will continue IHD as scheduled. Appreciate VVS consult. Plan for permanent access on 10/30. Outpatient HD pending.   2. Volume overload:improved with HD. Down 11 kg from admission.  3. Anion gap metabolic acidosis:Secondary to acute kidney injury and mild lactic acidosis, resolved with dialysis.   4. Cellulitis of left upper extremity: Superimposed on chronic lymphedema following ipsilateral mastectomy/lymph node dissection. On ceftriaxone and status post vancomycin- she has not shown any signs of vancomycin toxicity.   5. Anemia:Not clearly related to iron deficiency. Monitor for signs of bleeding.  6.Hypertension:blood pressure improved with transition from lopressor to carvedilol 6.25 mg BID.  7. Metabolic  bone disease.Continue Calcitriol to treat hypocalcemia.  Delice Bison    Labs: Basic Metabolic Panel: Recent Labs  Lab 08/07/19 1534 08/08/19 0227 08/09/19 0239  NA 136 136 137  K 4.2 3.5 3.2*  CL 103 104 103  CO2 21* 23 26  GLUCOSE 247* 153* 141*  BUN 33* 44* 23  CREATININE 3.49* 3.95* 2.30*  CALCIUM 7.3* 6.9* 6.9*  PHOS 4.4 4.4 3.2   Liver Function Tests: Recent Labs  Lab 08/02/19 2149 08/04/19 0754  08/07/19 1534 08/08/19 0227 08/09/19 0239  AST 20 14*  --   --   --   --   ALT 26 17  --   --   --   --   ALKPHOS 51 38  --   --   --   --   BILITOT 0.4 0.2*  --   --   --   --   PROT 6.0* 4.4*  --   --   --   --   ALBUMIN 2.6* 1.8*   < > 1.9* 1.6* 1.7*   < > = values in this interval not displayed.   No results for input(s): LIPASE, AMYLASE in the last 168 hours. No results for input(s): AMMONIA in the last 168 hours. CBC: Recent Labs  Lab 08/02/19 2149 08/03/19 0328  08/06/19 0719 08/07/19 0311 08/07/19 1534 08/08/19 0227 08/09/19 0239  WBC 5.0 5.7   < > 7.1 5.1 4.4 6.4 6.3  NEUTROABS 4.2 4.4  --   --   --   --   --   --  HGB 7.3* 6.2*   < > 9.1* 9.0* 10.7* 8.4* 7.9*  HCT 22.6* 18.5*   < > 27.3* 26.4* 31.9* 25.4* 24.0*  MCV 102.3* 96.9   < > 94.1 94.0 93.5 95.1 95.2  PLT 322 274   < > 129* 105* 109* 103* 104*   < > = values in this interval not displayed.   Cardiac Enzymes: No results for input(s): CKTOTAL, CKMB, CKMBINDEX, TROPONINI in the last 168 hours. CBG: Recent Labs  Lab 08/08/19 0559 08/08/19 0845 08/08/19 1631 08/08/19 2118 08/09/19 0811  GLUCAP 107* 92 170* 254* 104*    Iron Studies: No results for input(s): IRON, TIBC, TRANSFERRIN, FERRITIN in the last 72 hours. Studies/Results: Vas [place] Upper Ext Vein Mapping (pre-op Avf)  Result Date: 08/07/2019 UPPER EXTREMITY VEIN MAPPING  Indications: Pre-access. Comparison Study: no prior Performing Technologist: Abram Sander RVS  Examination Guidelines: A complete evaluation  includes B-mode imaging, spectral Doppler, color Doppler, and power Doppler as needed of all accessible portions of each vessel. Bilateral testing is considered an integral part of a complete examination. Limited examinations for reoccurring indications may be performed as noted. +-----------------+-------------+----------+--------+ Right Cephalic   Diameter (cm)Depth (cm)Findings +-----------------+-------------+----------+--------+ Shoulder             0.21        0.74            +-----------------+-------------+----------+--------+ Prox upper arm       0.19        0.58            +-----------------+-------------+----------+--------+ Mid upper arm        0.19        0.46            +-----------------+-------------+----------+--------+ Dist upper arm       0.25        0.30            +-----------------+-------------+----------+--------+ Antecubital fossa    0.29        0.42            +-----------------+-------------+----------+--------+ Prox forearm         0.23        0.20            +-----------------+-------------+----------+--------+ Mid forearm          0.22        0.27            +-----------------+-------------+----------+--------+ Dist forearm         0.17                        +-----------------+-------------+----------+--------+ +-----------------+-------------+----------+--------+ Right Basilic    Diameter (cm)Depth (cm)Findings +-----------------+-------------+----------+--------+ Prox upper arm       0.30        1.03            +-----------------+-------------+----------+--------+ Mid upper arm        0.29        1.25            +-----------------+-------------+----------+--------+ Dist upper arm       0.27        1.39            +-----------------+-------------+----------+--------+ Antecubital fossa    0.29        1.21            +-----------------+-------------+----------+--------+ Prox forearm         0.20        0.74             +-----------------+-------------+----------+--------+  Mid forearm          0.17        0.55            +-----------------+-------------+----------+--------+ Distal forearm       0.15        0.29            +-----------------+-------------+----------+--------+ +-----------------+-------------+----------+----------------------------+ Left Cephalic    Diameter (cm)Depth (cm)          Findings           +-----------------+-------------+----------+----------------------------+ Shoulder             0.26        0.99                                +-----------------+-------------+----------+----------------------------+ Prox upper arm       0.40        0.76                                +-----------------+-------------+----------+----------------------------+ Mid upper arm        0.42        0.69                                +-----------------+-------------+----------+----------------------------+ Dist upper arm       0.42        0.59                                +-----------------+-------------+----------+----------------------------+ Antecubital fossa    0.51        0.30                                +-----------------+-------------+----------+----------------------------+ Prox forearm         0.17        0.92                                +-----------------+-------------+----------+----------------------------+ Mid forearm          0.17        0.68            branching           +-----------------+-------------+----------+----------------------------+ Dist forearm                                   not visualized        +-----------------+-------------+----------+----------------------------+ Wrist                                   branching and not visualized +-----------------+-------------+----------+----------------------------+ +-----------------+-------------+----------+--------------+ Left Basilic     Diameter (cm)Depth (cm)   Findings     +-----------------+-------------+----------+--------------+ Shoulder                                not visualized +-----------------+-------------+----------+--------------+ Prox upper arm  not visualized +-----------------+-------------+----------+--------------+ Mid upper arm                           not visualized +-----------------+-------------+----------+--------------+ Dist upper arm                          not visualized +-----------------+-------------+----------+--------------+ Antecubital fossa                       not visualized +-----------------+-------------+----------+--------------+ Prox forearm                            not visualized +-----------------+-------------+----------+--------------+ Mid forearm                             not visualized +-----------------+-------------+----------+--------------+ Distal forearm                          not visualized +-----------------+-------------+----------+--------------+ Elbow                                   not visualized +-----------------+-------------+----------+--------------+ Wrist                                   not visualized +-----------------+-------------+----------+--------------+ *See table(s) above for measurements and observations.  Diagnosing physician: Deitra Mayo MD Electronically signed by Deitra Mayo MD on 08/07/2019 at 3:08:52 PM.    Final    Medications: Infusions: . sodium chloride    . cefTRIAXone (ROCEPHIN)  IV 1 g (08/08/19 2138)    Scheduled Medications: . amLODipine  10 mg Oral Daily  . atorvastatin  20 mg Oral Daily  . calcitRIOL  0.25 mcg Oral Daily  . carvedilol  6.25 mg Oral BID WC  . Chlorhexidine Gluconate Cloth  6 each Topical Q0600  . Chlorhexidine Gluconate Cloth  6 each Topical Q0600  . hydrALAZINE  10 mg Oral Q8H  . insulin aspart  0-9 Units Subcutaneous TID WC  . pantoprazole  40 mg Oral BID  .  potassium chloride  20 mEq Oral BID  . predniSONE  40 mg Oral Q breakfast  . sodium chloride flush  3 mL Intravenous Q12H  . sodium chloride flush  3 mL Intravenous Q12H    have reviewed scheduled and prn medications.  Physical Exam: General: awake, alert, pleasant female lying in bed in NAD Heart: RRR Lungs: CTAB Abdomen: soft, non-tender, non-distended  Extremities: Chronic LUE lymphedema; no LE edema   08/09/2019,11:25 AM  LOS: 7 days

## 2019-08-09 NOTE — TOC Initial Note (Signed)
Transition of Care [facility]) - Initial/Assessment Note    Patient Details  Name: Samantha Mckenzie MRN: [id number] Date of Birth: Jun 12, 1936  Transition of [facility] CM/SW Contact:    Benard Halsted, LCSW Phone Number: 08/09/2019, 10:42 AM  Clinical Narrative:                 CSW talked with pt about home health options. Pt stated that she does not have a preference about which home health agency. Pt stated that she will be doing her dialysis at [facility] which is close to where she resides. Pt reports to need no equipment since she has a walker at home. Her husband will transport her to dialysis. CSW provided medicare ratings list and sent referral to [facility] to review as patients first choice. CSW will continue to follow up as needed.   Expected Discharge Plan: Home/Self Care Barriers to Discharge: Continued Medical Work up   Patient Goals and CMS Choice Patient states their goals for this hospitalization and ongoing recovery are:: return home      Expected Discharge Plan and Services Expected Discharge Plan: Home/Self Care   Discharge Planning Services: CM Consult   Living arrangements for the past 2 months: Single Family Home                                      Prior Living Arrangements/Services Living arrangements for the past 2 months: Single Family Home Lives with:: Spouse Patient language and need for interpreter reviewed:: Yes Do you feel safe going back to the place where you live?: Yes      Need for Family Participation in Patient Care: Yes (Comment) Care giver support system in place?: Yes (comment) Current home services: DME Criminal Activity/Legal Involvement Pertinent to Current Situation/Hospitalization: No - Comment as needed  Activities of Daily Living Home Assistive Devices/Equipment: Gilford Rile (specify type) ADL Screening (condition at time of admission) Patient's cognitive ability adequate to safely complete daily activities?: No Is the  patient deaf or have difficulty hearing?: No Does the patient have difficulty seeing, even when wearing glasses/contacts?: No Does the patient have difficulty concentrating, remembering, or making decisions?: No Patient able to express need for assistance with ADLs?: Yes Does the patient have difficulty dressing or bathing?: No Independently performs ADLs?: No Communication: Independent Dressing (OT): Independent Grooming: Independent Feeding: Independent Bathing: Independent Toileting: Needs assistance Is this a change from baseline?: Pre-admission baseline In/Out Bed: Needs assistance Is this a change from baseline?: Pre-admission baseline Walks in Home: Needs assistance Is this a change from baseline?: Change from baseline, expected to last <3 days Does the patient have difficulty walking or climbing stairs?: Yes Weakness of Legs: Both Weakness of Arms/Hands: Both  Permission Sought/Granted                  Emotional Assessment Appearance:: Appears stated age Attitude/Demeanor/Rapport: Engaged, Gracious Affect (typically observed): Accepting, Calm, Pleasant Orientation: : Oriented to Self, Oriented to Place, Oriented to  Time, Oriented to Situation Alcohol / Substance Use: Never Used Psych Involvement: No (comment)  Admission diagnosis:  AKI (acute kidney injury) ([place]) [N17.9] Cellulitis of arm, left [L03.114] Patient Active Problem List   Diagnosis Date Noted  . Cellulitis of arm, left 08/02/2019  . Acidosis   . Dementia ([place]) 06/17/2019  . Abnormal CT scan, small bowel   . Dehydration 05/31/2019  . Acute blood loss anemia   .  Ileitis 05/30/2019  . Terminal ileitis with complication ([place]) 68/08/5725  . COPD (chronic obstructive pulmonary disease) ([place]) 04/12/2019  . Enteritis 04/07/2019  . Chronic diastolic CHF (congestive heart failure) ([place]) 04/07/2019  . Hypokalemia 04/07/2019  . Hypocalcemia 04/07/2019  . Fall 04/07/2019  . Occult blood positive stool  04/07/2019  . Adjustment disorder with depressed mood   . Generalized abdominal pain   . Dysphasia 03/21/2019  . CAP (community acquired pneumonia) 03/20/2019  . Nausea vomiting and diarrhea 03/20/2019  . Acute respiratory failure with hypoxia ([place]) 03/20/2019  . Acute renal failure superimposed on stage 4 chronic kidney disease ([place]) 03/20/2019  . Lactic acidosis 03/20/2019  . Diabetic peripheral neuropathy associated with type 2 diabetes mellitus ([place]) 03/20/2019  . Hypomagnesemia 06/21/2018  . Generalized weakness 06/20/2018  . PCP NOTES >>> 07/11/2015  . Sepsis ([place]) 04/19/2013  . Depression   . VITAMIN B12 DEFICIENCY 04/01/2008  . GERD 12/19/2007  . NEOP, MALIGNANT, FEMALE BREAST NOS 07/07/2007  . Osteoarthritis 07/07/2007  . Diabetes mellitus type 2 with complications ([place]) 20/35/5974  . Hyperlipidemia 02/28/2007  . GOUT 02/28/2007  . Macrocytic anemia 02/28/2007  . Essential hypertension 02/28/2007   PCP:  Colon Branch, MD Pharmacy:   [place], [place] [address] [place] [address] Phone: [phone] Fax: [facility] [address], [place]. [place]. [place] [address] Phone: [phone] Fax: [phone]     Social Determinants of Health (SDOH) Interventions    Readmission Risk Interventions Readmission Risk Prevention Plan 08/03/2019 08/03/2019 06/04/2019  Transportation Screening Complete - Complete  PCP or Specialist Appt within 3-5 Days - - -  HRI or [place] for [place] - - -  Medication Review ([place]) Referral to Pharmacy - Complete  PCP or Specialist appointment within 3-5 days of discharge Complete - Complete  HRI or Home Care Consult Complete - -  SW Recovery Care/Counseling Consult Complete Complete Complete  Palliative  Care Screening Complete Complete Not [place] Not Applicable - -  Some recent data might be hidden

## 2019-08-09 NOTE — Anesthesia Preprocedure Evaluation (Addendum)
Anesthesia Evaluation  Patient identified by MRN, date of birth, ID band Patient awake    Reviewed: Allergy & Precautions, NPO status , reviewed documented beta blocker date and time   Airway Mallampati: II  TM Distance: >3 FB Neck ROM: Full    Dental  (+) Teeth Intact, Dental Advisory Given   Pulmonary    breath sounds clear to auscultation       Cardiovascular hypertension, +CHF   Rhythm:Regular Rate:Normal     Neuro/Psych    GI/Hepatic GERD  ,  Endo/Other  diabetes  Renal/GU Renal disease     Musculoskeletal   Abdominal   Peds  Hematology   Anesthesia Other Findings   Reproductive/Obstetrics                            Anesthesia Physical Anesthesia Plan  ASA: III  Anesthesia Plan: MAC   Post-op Pain Management:    Induction: Intravenous  PONV Risk Score and Plan: 2 and Ondansetron  Airway Management Planned: Nasal Cannula and Simple Face Mask  Additional Equipment:   Intra-op Plan:   Post-operative Plan:   Informed Consent: I have reviewed the patients History and Physical, chart, labs and discussed the procedure including the risks, benefits and alternatives for the proposed anesthesia with the patient or authorized representative who has indicated his/her understanding and acceptance.     Dental advisory given  Plan Discussed with: Anesthesiologist and CRNA  Anesthesia Plan Comments:        Anesthesia Quick Evaluation

## 2019-08-10 ENCOUNTER — Inpatient Hospital Stay (HOSPITAL_COMMUNITY): Payer: Medicare Other | Admitting: Anesthesiology

## 2019-08-10 ENCOUNTER — Encounter (HOSPITAL_COMMUNITY)
Admission: EM | Disposition: A | Discharge status: Home Health | Payer: Self-pay | Source: Ambulatory Visit | Attending: Internal Medicine

## 2019-08-10 DIAGNOSIS — N185 Chronic kidney disease, stage 5: Secondary | ICD-10-CM

## 2019-08-10 HISTORY — PX: AV FISTULA PLACEMENT: SHX1204

## 2019-08-10 LAB — CBC WITH DIFFERENTIAL/PLATELET
Abs Immature Granulocytes: 0.04 10*3/uL (ref 0.00–0.07)
Basophils Absolute: 0 10*3/uL (ref 0.0–0.1)
Basophils Relative: 0 %
Eosinophils Absolute: 0 10*3/uL (ref 0.0–0.5)
Eosinophils Relative: 0 %
HCT: 27.1 % — ABNORMAL LOW (ref 36.0–46.0)
Hemoglobin: 8.8 g/dL — ABNORMAL LOW (ref 12.0–15.0)
Immature Granulocytes: 1 %
Lymphocytes Relative: 16 %
Lymphs Abs: 0.7 10*3/uL (ref 0.7–4.0)
MCH: 31.1 pg (ref 26.0–34.0)
MCHC: 32.5 g/dL (ref 30.0–36.0)
MCV: 95.8 fL (ref 80.0–100.0)
Monocytes Absolute: 0.3 10*3/uL (ref 0.1–1.0)
Monocytes Relative: 7 %
Neutro Abs: 3.6 10*3/uL (ref 1.7–7.7)
Neutrophils Relative %: 76 %
Platelets: 104 10*3/uL — ABNORMAL LOW (ref 150–400)
RBC: 2.83 MIL/uL — ABNORMAL LOW (ref 3.87–5.11)
RDW: 16.4 % — ABNORMAL HIGH (ref 11.5–15.5)
WBC: 4.7 10*3/uL (ref 4.0–10.5)
nRBC: 0 % (ref 0.0–0.2)

## 2019-08-10 LAB — RENAL FUNCTION PANEL
Albumin: 1.8 g/dL — ABNORMAL LOW (ref 3.5–5.0)
Anion gap: 11 (ref 5–15)
BUN: 49 mg/dL — ABNORMAL HIGH (ref 8–23)
CO2: 22 mmol/L (ref 22–32)
Calcium: 6.9 mg/dL — ABNORMAL LOW (ref 8.9–10.3)
Chloride: 103 mmol/L (ref 98–111)
Creatinine, Ser: 3.7 mg/dL — ABNORMAL HIGH (ref 0.44–1.00)
GFR calc Af Amer: 13 mL/min — ABNORMAL LOW (ref >60)
GFR calc non Af Amer: 11 mL/min — ABNORMAL LOW (ref >60)
Glucose, Bld: 152 mg/dL — ABNORMAL HIGH (ref 70–99)
Phosphorus: 3.9 mg/dL (ref 2.5–4.6)
Potassium: 4 mmol/L (ref 3.5–5.1)
Sodium: 136 mmol/L (ref 135–145)

## 2019-08-10 LAB — MAGNESIUM: Magnesium: 2.1 mg/dL (ref 1.7–2.4)

## 2019-08-10 LAB — SURGICAL PCR SCREEN
MRSA, PCR: NEGATIVE
Staphylococcus aureus: NEGATIVE

## 2019-08-10 SURGERY — ARTERIOVENOUS (AV) FISTULA CREATION
Anesthesia: Monitor Anesthesia Care | Site: Arm Upper | Laterality: Right

## 2019-08-10 MED ORDER — LIDOCAINE-EPINEPHRINE (PF) 1 %-1:200000 IJ SOLN
INTRAMUSCULAR | Status: DC | PRN
  Administered 2019-08-10: 14 mL

## 2019-08-10 MED ORDER — HYDRALAZINE HCL 10 MG PO TABS: 10.0000 mg | ORAL_TABLET | Freq: Three times a day (TID) | ORAL | 0 refills | Status: DC

## 2019-08-10 MED ORDER — CARVEDILOL 6.25 MG PO TABS: 6.2500 mg | ORAL_TABLET | Freq: Two times a day (BID) | ORAL | 0 refills | Status: DC

## 2019-08-10 MED ORDER — SODIUM CHLORIDE 0.9 % IV SOLN
INTRAVENOUS | Status: DC | PRN
  Administered 2019-08-10: 500 mL

## 2019-08-10 MED ORDER — MIDAZOLAM HCL 2 MG/2ML IJ SOLN
INTRAMUSCULAR | Status: AC
  Filled 2019-08-10: qty 2

## 2019-08-10 MED ORDER — HYDROCODONE-ACETAMINOPHEN 5-325 MG PO TABS: 1.0000 | ORAL_TABLET | ORAL | Status: DC | PRN

## 2019-08-10 MED ORDER — THROMBIN (RECOMBINANT) 20000 UNITS EX SOLR
CUTANEOUS | Status: AC
  Filled 2019-08-10: qty 20000

## 2019-08-10 MED ORDER — FENTANYL CITRATE (PF) 100 MCG/2ML IJ SOLN: 25.0000 ug | INTRAMUSCULAR | Status: DC | PRN

## 2019-08-10 MED ORDER — PROTAMINE SULFATE 10 MG/ML IV SOLN
INTRAVENOUS | Status: DC | PRN
  Administered 2019-08-10: 40 mg via INTRAVENOUS

## 2019-08-10 MED ORDER — FENTANYL CITRATE (PF) 250 MCG/5ML IJ SOLN
INTRAMUSCULAR | Status: AC
  Filled 2019-08-10: qty 5

## 2019-08-10 MED ORDER — PROPOFOL 10 MG/ML IV BOLUS
INTRAVENOUS | Status: AC
  Filled 2019-08-10: qty 20

## 2019-08-10 MED ORDER — 0.9 % SODIUM CHLORIDE (POUR BTL) OPTIME
TOPICAL | Status: DC | PRN
  Administered 2019-08-10: 1000 mL

## 2019-08-10 MED ORDER — PROPOFOL 500 MG/50ML IV EMUL
INTRAVENOUS | Status: DC | PRN
  Administered 2019-08-10: 25 ug/kg/min via INTRAVENOUS

## 2019-08-10 MED ORDER — DARBEPOETIN ALFA 100 MCG/0.5ML IJ SOSY: 100.0000 ug | PREFILLED_SYRINGE | INTRAMUSCULAR | Status: AC

## 2019-08-10 MED ORDER — PHENYLEPHRINE 40 MCG/ML (10ML) SYRINGE FOR IV PUSH (FOR BLOOD PRESSURE SUPPORT)
PREFILLED_SYRINGE | INTRAVENOUS | Status: DC | PRN
  Administered 2019-08-10 (×2): 80 ug via INTRAVENOUS

## 2019-08-10 MED ORDER — FENTANYL CITRATE (PF) 100 MCG/2ML IJ SOLN
INTRAMUSCULAR | Status: DC | PRN
  Administered 2019-08-10 (×2): 50 ug via INTRAVENOUS

## 2019-08-10 MED ORDER — MIDAZOLAM HCL 5 MG/5ML IJ SOLN
INTRAMUSCULAR | Status: DC | PRN
  Administered 2019-08-10: 2 mg via INTRAVENOUS

## 2019-08-10 MED ORDER — ONDANSETRON HCL 4 MG/2ML IJ SOLN
INTRAMUSCULAR | Status: DC | PRN
  Administered 2019-08-10: 4 mg via INTRAVENOUS

## 2019-08-10 MED ORDER — SODIUM CHLORIDE 0.9 % IV SOLN
INTRAVENOUS | Status: DC | PRN
  Administered 2019-08-10: 08:00:00 via INTRAVENOUS

## 2019-08-10 MED ORDER — HEPARIN SODIUM (PORCINE) 1000 UNIT/ML IJ SOLN
INTRAMUSCULAR | Status: DC | PRN
  Administered 2019-08-10: 7000 [IU] via INTRAVENOUS

## 2019-08-10 MED ORDER — ONDANSETRON HCL 4 MG/2ML IJ SOLN: 4.0000 mg | Freq: Once | INTRAMUSCULAR | Status: DC | PRN

## 2019-08-10 MED ORDER — LIDOCAINE HCL (PF) 1 % IJ SOLN
INTRAMUSCULAR | Status: DC | PRN
  Administered 2019-08-10: 15 mL

## 2019-08-10 MED ORDER — LIDOCAINE HCL (PF) 1 % IJ SOLN
INTRAMUSCULAR | Status: AC
  Filled 2019-08-10: qty 30

## 2019-08-10 MED ORDER — SODIUM CHLORIDE 0.9 % IV SOLN
INTRAVENOUS | Status: AC
  Filled 2019-08-10: qty 1.2

## 2019-08-10 MED ORDER — DARBEPOETIN ALFA 100 MCG/0.5ML IJ SOSY: 100.0000 ug | PREFILLED_SYRINGE | INTRAMUSCULAR | Status: DC

## 2019-08-10 SURGICAL SUPPLY — 36 items
ARMBAND PINK RESTRICT EXTREMIT (MISCELLANEOUS) ×6 IMPLANT
CANISTER SUCT 3000ML PPV (MISCELLANEOUS) ×3 IMPLANT
CANNULA VESSEL 3MM 2 BLNT TIP (CANNULA) ×3 IMPLANT
CLIP VESOCCLUDE MED 6/CT (CLIP) ×3 IMPLANT
CLIP VESOCCLUDE SM WIDE 6/CT (CLIP) ×5 IMPLANT
COVER PROBE W GEL 5X96 (DRAPES) IMPLANT
COVER WAND RF STERILE (DRAPES) ×3 IMPLANT
DECANTER SPIKE VIAL GLASS SM (MISCELLANEOUS) ×7 IMPLANT
DERMABOND ADVANCED (GAUZE/BANDAGES/DRESSINGS) ×2
DERMABOND ADVANCED .7 DNX12 (GAUZE/BANDAGES/DRESSINGS) ×1 IMPLANT
ELECT REM PT RETURN 9FT ADLT (ELECTROSURGICAL) ×3
ELECTRODE REM PT RTRN 9FT ADLT (ELECTROSURGICAL) ×1 IMPLANT
GLOVE BIO SURGEON STRL SZ7.5 (GLOVE) ×5 IMPLANT
GLOVE BIOGEL PI IND STRL 7.0 (GLOVE) IMPLANT
GLOVE BIOGEL PI IND STRL 8 (GLOVE) ×1 IMPLANT
GLOVE BIOGEL PI INDICATOR 7.0 (GLOVE) ×2
GLOVE BIOGEL PI INDICATOR 8 (GLOVE) ×4
GLOVE ECLIPSE 7.5 STRL STRAW (GLOVE) ×6 IMPLANT
GLOVE ECLIPSE 8.0 STRL XLNG CF (GLOVE) ×4 IMPLANT
GOWN STRL REUS W/ TWL LRG LVL3 (GOWN DISPOSABLE) ×3 IMPLANT
GOWN STRL REUS W/TWL LRG LVL3 (GOWN DISPOSABLE) ×6
GRAFT GORETEX STRT 4-7X45 (Vascular Products) ×2 IMPLANT
KIT BASIN OR (CUSTOM PROCEDURE TRAY) ×3 IMPLANT
KIT TURNOVER KIT B (KITS) ×3 IMPLANT
NS IRRIG 1000ML POUR BTL (IV SOLUTION) ×3 IMPLANT
PACK CV ACCESS (CUSTOM PROCEDURE TRAY) ×3 IMPLANT
PAD ARMBOARD 7.5X6 YLW CONV (MISCELLANEOUS) ×6 IMPLANT
SPONGE SURGIFOAM ABS GEL 100 (HEMOSTASIS) IMPLANT
SUT PROLENE 6 0 BV (SUTURE) ×7 IMPLANT
SUT VIC AB 3-0 SH 27 (SUTURE) ×2
SUT VIC AB 3-0 SH 27X BRD (SUTURE) ×1 IMPLANT
SUT VICRYL 4-0 PS2 18IN ABS (SUTURE) ×3 IMPLANT
SYR CONTROL 10ML LL (SYRINGE) ×2 IMPLANT
TOWEL GREEN STERILE (TOWEL DISPOSABLE) ×3 IMPLANT
UNDERPAD 30X30 (UNDERPADS AND DIAPERS) ×3 IMPLANT
WATER STERILE IRR 1000ML POUR (IV SOLUTION) ×3 IMPLANT

## 2019-08-10 NOTE — Progress Notes (Signed)
Report called and given to [facility] G.,RN in Short Stay for patient prior to her being transported to the OR.

## 2019-08-10 NOTE — Progress Notes (Signed)
Consult placed to cap dialysis line. Upon assessment noted non-filtered infusion line connected to veinous dialysis port. Line flushed with NS, brisk blood return, instilled heparin. Instructed patient to cover dialysis catheter site with waterproof dressing and waterproof tape and shower/bath before dialysis. Dialysis to assess drsg. at appointment tomorrow, 08/11/2019. Patient VU. Fran Lowes, RN VAST

## 2019-08-10 NOTE — Discharge Summary (Addendum)
. Physician Discharge Summary  Samantha Mckenzie MPN:[id number] DOB: [birth year] DOA: 08/02/2019  PCP: Colon Branch, MD  Admit date: 08/02/2019 Discharge date: 08/10/2019  Admitted From: Home Disposition:  Discharged to home.  Recommendations for Outpatient Follow-up:  1. Follow up with PCP in 1-2 weeks 2. Please obtain BMP/CBC in one week 3. Please follow up on the pending renal biopsy results. 4. Follow up with Nephrology as scheduled.  Discharge Condition: Stable  CODE STATUS: DNR   Brief/Interim Summary: 83 year old with past medical history significant for depression, breast cancer status post left mastectomy/chemotherapy radiation, anemia Crohn's ileitis, CAD and CKD stage IV who presents to the emergency department for evaluation of progressive left arm swelling and bilateral lower extremity edema. Patient reports occasional left arm swelling since her left mastectomy 20 years ago, but this has been much worse in recent weeks, also report redness. She was treated as an outpatient with 2 courses of antibiotics without significant improvement. She denies hematochezia melena.  10/28: Denies complaints. Hgb down a little today. To go to HD today. 10/29: Reports that she's feeling better today. Excited to possibly go home tomorrow. 10/30: Denies complaints this AM. Says, "I'm ready to go."  Discharge Diagnoses:  Principal Problem:   Acute renal failure superimposed on stage 4 chronic kidney disease ([place]) Active Problems:   Diabetes mellitus type 2 with complications ([place])   Macrocytic anemia   Essential hypertension   Cellulitis of arm, left   Acidosis  Acute kidney injuries superimposed on chronic kidney disease a stage IV Metabolic acidosis hyperkalemia: - Patient presented with a BUN 108 and creatinine at 6. Earlier this month creatinine 3.7 - npehro onboard; now on iHD - s/p renal Bx, awaiting results - Vascular consulted, for evaluation for AV  fistula or graft. Plan for Right arm access on Friday.      - 10/29: looks like she's set for AV fistula tomorrow; [facility] has secured her a outpt HD bed; possible d/c to home after procedure tomorrow     - 10/30: ok for discharge; renal bx results to be followed up by nephro/PCP  Left arm cellulitis: Cellulitis superimposed on lymphedema. - ceftriaxone; END DATE 10/29 - elevate of the arm. - [place] negative for DVT.  - supposed to be wearing arm compression at home, but non-compliant      - 10/29: continues to improve; d/c abx today  Normocytic anemia, multifactorial - No active bleeding. - She report black stool but she is taking oral iron. - FOBT positive - GI has seen; no acute intevention at this timel rec continuing steroids for her Crohns - s/p 2 units pRBCs - on PPT - monitor Hgb     - 10/29: Hgb is about 8 this AM; continue monitor     - 10/30: Hgb 8.8 this AM, no evidence of bleed  DM2 - Continue sliding scale insulin.  Hypertension: - coreg, norvasc, hydralazine  CAD - coreg, atorvastatin  Crohn's disease: - prednisone.  - PRN Imodium     - 10/30: started on long prednisone taper by GI in October; to continue 25m daily through tomorrow; 379mdaily Nov 1 - 15; and 2023maily from there on per GI orders on 07/23/2019  Discharge Instructions   Allergies as of 08/10/2019      Reactions   Codeine Nausea And Vomiting   Morphine Nausea And Vomiting   Oxycodone-acetaminophen Nausea And Vomiting   Sulfonamide Derivatives Nausea And Vomiting   Tramadol Hcl Nausea And Vomiting  Medication List    STOP taking these medications   doxycycline 100 MG tablet Commonly known as: VIBRA-TABS   ferrous sulfate 325 (65 FE) MG tablet   metoprolol tartrate 25 MG tablet Commonly known as: LOPRESSOR   mirtazapine 15 MG tablet Commonly known as: REMERON     TAKE these medications   acetaminophen 325  MG tablet Commonly known as: TYLENOL Take 2 tablets (650 mg total) by mouth every 6 (six) hours as needed for mild pain, fever or headache (or Fever >/= 101).   allopurinol 100 MG tablet Commonly known as: ZYLOPRIM Take 1 tablet (100 mg total) by mouth daily.   amLODipine 10 MG tablet Commonly known as: NORVASC Take 1 tablet (10 mg total) by mouth daily.   atorvastatin 20 MG tablet Commonly known as: LIPITOR Take 1 tablet (20 mg total) by mouth daily.   CALTRATE 600+D3 PO Take 1 tablet by mouth 2 (two) times daily.   carvedilol 6.25 MG tablet Commonly known as: COREG Take 1 tablet (6.25 mg total) by mouth 2 (two) times daily with a meal.   CENTRUM SILVER PO Take 1 tablet by mouth daily.   Darbepoetin Alfa 100 MCG/0.5ML Sosy injection Commonly known as: ARANESP Inject 0.5 mLs (100 mcg total) into the vein every Saturday with hemodialysis. Start taking on: August 11, 2019   dicyclomine 10 MG capsule Commonly known as: BENTYL Take 2 capsules (20 mg total) by mouth 4 (four) times daily -  before meals and at bedtime.   dronabinol 2.5 MG capsule Commonly known as: Marinol Take 1 capsule (2.5 mg total) by mouth 2 (two) times daily before a meal.   escitalopram 10 MG tablet Commonly known as: Lexapro Take 2 tablets (20 mg total) by mouth daily.   hydrALAZINE 10 MG tablet Commonly known as: APRESOLINE Take 1 tablet (10 mg total) by mouth every 8 (eight) hours.   loperamide 2 MG tablet Commonly known as: IMODIUM A-D Take 2 mg by mouth 4 (four) times daily as needed for diarrhea or loose stools.   ondansetron 4 MG tablet Commonly known as: ZOFRAN Take 1 tablet (4 mg total) by mouth 4 (four) times daily.   pantoprazole 40 MG tablet Commonly known as: PROTONIX Take 1 tablet (40 mg total) by mouth daily before breakfast.   predniSONE 10 MG tablet Commonly known as: DELTASONE Take 1 tablets a day (31m) for one month; then take 1.5 tablets a day (379m for 2 weeks;  then take 1 tablet a day (2061muntil further notice   predniSONE 20 MG tablet Commonly known as: Deltasone Prednisone 40 mg daily during the month of October 2.  Prednisone 30 mg daily from November 1 through November 15 3.  Prednisone 20 mg daily from November 16 until further notice   vitamin B-12 1000 MCG tablet Commonly known as: CYANOCOBALAMIN Take 1,000 mcg by mouth daily.   vitamin C 500 MG tablet Commonly known as: ASCORBIC ACID Take 500 mg by mouth daily.   Vitamin D3 25 MCG (1000 UT) Caps Take 1 capsule by mouth daily.      Follow-up Information    DicAngelia MouldD Follow up in 3 week(s).   Specialties: Vascular Surgery, Cardiology Contact information: [address] [place] [phone]       Allergies  Allergen Reactions  . Codeine Nausea And Vomiting  . Morphine Nausea And Vomiting  . Oxycodone-Acetaminophen Nausea And Vomiting  . Sulfonamide Derivatives Nausea And Vomiting  .  Tramadol Hcl Nausea And Vomiting    Consultations:  Nephrology  Vascular Surgery  GI   Procedures/Studies: Dg Forearm Left  Result Date: 08/02/2019 CLINICAL DATA:  Left arm cellulitis. EXAM: LEFT FOREARM - 2 VIEW COMPARISON:  None. FINDINGS: There is no evidence of fracture or other focal bone lesions. No bone erosions identified. There is diffuse soft tissue swelling identified. IMPRESSION: 1. No acute bone abnormality. 2. Diffuse soft tissue swelling. Electronically Signed   By: Kerby Moors M.D.   On: 08/02/2019 21:31   US Renal  Result Date: 08/03/2019 CLINICAL DATA:  Initial evaluation for acute renal failure. EXAM: RENAL / URINARY TRACT ULTRASOUND COMPLETE COMPARISON:  Prior CT from 05/30/2019. FINDINGS: Right Kidney: Renal measurements: 10.7 x 5.8 x 5.6 cm = volume: 180 mL. Diffusely increased echogenicity within the renal parenchyma, compatible with medical renal disease. No nephrolithiasis or hydronephrosis. 2.2 x 1.7 x 1.9 cm simple  cyst present at the upper pole. Left Kidney: Renal measurements: 8.3 x 4.8 x 4.1 cm = volume: 85.2 mL. Diffusely increased echogenicity within the renal parenchyma, compatible with medical renal disease. No nephrolithiasis or hydronephrosis. No focal renal mass. Bladder: Appears normal for degree of bladder distention. Other: None. IMPRESSION: 1. Diffusely increased echogenicity within the renal parenchyma, compatible with medical renal disease. No hydronephrosis. 2. 2.2 cm simple right renal cyst. Electronically Signed   By: Jeannine Boga M.D.   On: 08/03/2019 01:08   Ir Fluoro Guide Cv Line Right  Result Date: 08/03/2019 INDICATION: End-stage renal disease. Please perform tunnel dialysis catheter placement for the initiation of dialysis. EXAM: TUNNELED CENTRAL VENOUS HEMODIALYSIS CATHETER PLACEMENT WITH ULTRASOUND AND FLUOROSCOPIC GUIDANCE MEDICATIONS: Ancef 2 gm IV . The antibiotic was given in an appropriate time interval prior to skin puncture. ANESTHESIA/SEDATION: Versed 1 mg IV; Fentanyl 50 mcg IV; Moderate Sedation Time:  12 minutes The patient was continuously monitored during the procedure by the interventional radiology nurse under my direct supervision. FLUOROSCOPY TIME:  24 seconds (2 mGy) COMPLICATIONS: None immediate. PROCEDURE: Informed written consent was obtained from the patient after a discussion of the risks, benefits, and alternatives to treatment. Questions regarding the procedure were encouraged and answered. The right neck and chest were prepped with chlorhexidine in a sterile fashion, and a sterile drape was applied covering the operative field. Maximum barrier sterile technique with sterile gowns and gloves were used for the procedure. A timeout was performed prior to the initiation of the procedure. After creating a small venotomy incision, a micropuncture kit was utilized to access the internal jugular vein. Real-time ultrasound guidance was utilized for vascular access  including the acquisition of a permanent ultrasound image documenting patency of the accessed vessel. The microwire was utilized to measure appropriate catheter length. A stiff Glidewire was advanced to the level of the IVC and the micropuncture sheath was exchanged for a peel-away sheath. A palindrome tunneled hemodialysis catheter measuring 19 cm from tip to cuff was tunneled in a retrograde fashion from the anterior chest wall to the venotomy incision. The catheter was then placed through the peel-away sheath with tips ultimately positioned within the superior aspect of the right atrium. Final catheter positioning was confirmed and documented with a spot radiographic image. The catheter aspirates and flushes normally. The catheter was flushed with appropriate volume heparin dwells. The catheter exit site was secured with a 0-Prolene retention suture. The venotomy incision was closed with Dermabond and Steri-strips. Dressings were applied. The patient tolerated the procedure well without immediate post procedural complication.  IMPRESSION: Successful placement of 19 cm tip to cuff tunneled hemodialysis catheter via the right internal jugular vein with tips terminating within the superior aspect of the right atrium. The catheter is ready for immediate use. Electronically Signed   By: Sandi Mariscal M.D.   On: 08/03/2019 16:31   Ir US Guide Vasc Access Right  Result Date: 08/03/2019 INDICATION: End-stage renal disease. Please perform tunnel dialysis catheter placement for the initiation of dialysis. EXAM: TUNNELED CENTRAL VENOUS HEMODIALYSIS CATHETER PLACEMENT WITH ULTRASOUND AND FLUOROSCOPIC GUIDANCE MEDICATIONS: Ancef 2 gm IV . The antibiotic was given in an appropriate time interval prior to skin puncture. ANESTHESIA/SEDATION: Versed 1 mg IV; Fentanyl 50 mcg IV; Moderate Sedation Time:  12 minutes The patient was continuously monitored during the procedure by the interventional radiology nurse under my direct  supervision. FLUOROSCOPY TIME:  24 seconds (2 mGy) COMPLICATIONS: None immediate. PROCEDURE: Informed written consent was obtained from the patient after a discussion of the risks, benefits, and alternatives to treatment. Questions regarding the procedure were encouraged and answered. The right neck and chest were prepped with chlorhexidine in a sterile fashion, and a sterile drape was applied covering the operative field. Maximum barrier sterile technique with sterile gowns and gloves were used for the procedure. A timeout was performed prior to the initiation of the procedure. After creating a small venotomy incision, a micropuncture kit was utilized to access the internal jugular vein. Real-time ultrasound guidance was utilized for vascular access including the acquisition of a permanent ultrasound image documenting patency of the accessed vessel. The microwire was utilized to measure appropriate catheter length. A stiff Glidewire was advanced to the level of the IVC and the micropuncture sheath was exchanged for a peel-away sheath. A palindrome tunneled hemodialysis catheter measuring 19 cm from tip to cuff was tunneled in a retrograde fashion from the anterior chest wall to the venotomy incision. The catheter was then placed through the peel-away sheath with tips ultimately positioned within the superior aspect of the right atrium. Final catheter positioning was confirmed and documented with a spot radiographic image. The catheter aspirates and flushes normally. The catheter was flushed with appropriate volume heparin dwells. The catheter exit site was secured with a 0-Prolene retention suture. The venotomy incision was closed with Dermabond and Steri-strips. Dressings were applied. The patient tolerated the procedure well without immediate post procedural complication. IMPRESSION: Successful placement of 19 cm tip to cuff tunneled hemodialysis catheter via the right internal jugular vein with tips terminating  within the superior aspect of the right atrium. The catheter is ready for immediate use. Electronically Signed   By: Sandi Mariscal M.D.   On: 08/03/2019 16:31   Dg Hand Complete Left  Result Date: 08/02/2019 CLINICAL DATA:  Left arm cellulitis EXAM: LEFT HAND - COMPLETE 3+ VIEW COMPARISON:  None. FINDINGS: There is no evidence of fracture or dislocation. 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 Diffuse dorsal soft tissue edema is noted. No subcutaneous emphysema seen. IMPRESSION: Diffuse dorsal subcutaneous edema surrounding the wrist and hand, likely consistent with cellulitis. Electronically Signed   By: Prudencio Pair M.D.   On: 08/02/2019 21:33   US Biopsy (kidney)  Result Date: 08/07/2019 INDICATION: Acute kidney injury and need for renal biopsy. EXAM: ULTRASOUND GUIDED CORE BIOPSY OF LEFT KIDNEY MEDICATIONS: None. ANESTHESIA/SEDATION: Fentanyl 50 mcg IV; Versed 0.5 mg IV Moderate Sedation Time:  15 minutes. The patient was continuously monitored during the procedure by the interventional radiology nurse  under my direct supervision. PROCEDURE: The procedure, risks, benefits, and alternatives were explained to the patient. Questions regarding the procedure were encouraged and answered. The patient understands and consents to the procedure. Ultrasound was performed to localize the kidneys in a prone position. The left flank region was prepped with chlorhexidine in a sterile fashion, and a sterile drape was applied covering the operative field. A sterile gown and sterile gloves were used for the procedure. Local anesthesia was provided with 1% Lidocaine. Under direct ultrasound guidance, 2 separate 16 gauge core biopsy samples were obtained through lower pole cortex of the left kidney. Solid tissue was obtained and submitted in saline. Additional ultrasound was performed. COMPLICATIONS: None immediate. FINDINGS: Both kidneys are extremely echogenic by  ultrasound. The left renal cortex was better visualized than the right by ultrasound and therefore was chosen for biopsy. Postprocedural ultrasound shows no evidence of immediate hemorrhage. IMPRESSION: Ultrasound-guided core biopsy performed of the left kidney at the level of lower pole cortex. Electronically Signed   By: Aletta Edouard M.D.   On: 08/07/2019 13:21   Vas [place] Upper Ext Vein Mapping (pre-op Avf)  Result Date: 08/07/2019 UPPER EXTREMITY VEIN MAPPING  Indications: Pre-access. Comparison Study: no prior Performing Technologist: Abram Sander RVS  Examination Guidelines: A complete evaluation includes B-mode imaging, spectral Doppler, color Doppler, and power Doppler as needed of all accessible portions of each vessel. Bilateral testing is considered an integral part of a complete examination. Limited examinations for reoccurring indications may be performed as noted. +-----------------+-------------+----------+--------+ Right Cephalic   Diameter (cm)Depth (cm)Findings +-----------------+-------------+----------+--------+ Shoulder             0.21        0.74            +-----------------+-------------+----------+--------+ Prox upper arm       0.19        0.58            +-----------------+-------------+----------+--------+ Mid upper arm        0.19        0.46            +-----------------+-------------+----------+--------+ Dist upper arm       0.25        0.30            +-----------------+-------------+----------+--------+ Antecubital fossa    0.29        0.42            +-----------------+-------------+----------+--------+ Prox forearm         0.23        0.20            +-----------------+-------------+----------+--------+ Mid forearm          0.22        0.27            +-----------------+-------------+----------+--------+ Dist forearm         0.17                        +-----------------+-------------+----------+--------+  +-----------------+-------------+----------+--------+ Right Basilic    Diameter (cm)Depth (cm)Findings +-----------------+-------------+----------+--------+ Prox upper arm       0.30        1.03            +-----------------+-------------+----------+--------+ Mid upper arm        0.29        1.25            +-----------------+-------------+----------+--------+ Dist upper arm       0.27  1.39            +-----------------+-------------+----------+--------+ Antecubital fossa    0.29        1.21            +-----------------+-------------+----------+--------+ Prox forearm         0.20        0.74            +-----------------+-------------+----------+--------+ Mid forearm          0.17        0.55            +-----------------+-------------+----------+--------+ Distal forearm       0.15        0.29            +-----------------+-------------+----------+--------+ +-----------------+-------------+----------+----------------------------+ Left Cephalic    Diameter (cm)Depth (cm)          Findings           +-----------------+-------------+----------+----------------------------+ Shoulder             0.26        0.99                                +-----------------+-------------+----------+----------------------------+ Prox upper arm       0.40        0.76                                +-----------------+-------------+----------+----------------------------+ Mid upper arm        0.42        0.69                                +-----------------+-------------+----------+----------------------------+ Dist upper arm       0.42        0.59                                +-----------------+-------------+----------+----------------------------+ Antecubital fossa    0.51        0.30                                +-----------------+-------------+----------+----------------------------+ Prox forearm         0.17        0.92                                 +-----------------+-------------+----------+----------------------------+ Mid forearm          0.17        0.68            branching           +-----------------+-------------+----------+----------------------------+ Dist forearm                                   not visualized        +-----------------+-------------+----------+----------------------------+ Wrist                                   branching and not visualized +-----------------+-------------+----------+----------------------------+ +-----------------+-------------+----------+--------------+ Left Basilic  Diameter (cm)Depth (cm)   Findings    +-----------------+-------------+----------+--------------+ Shoulder                                not visualized +-----------------+-------------+----------+--------------+ Prox upper arm                          not visualized +-----------------+-------------+----------+--------------+ Mid upper arm                           not visualized +-----------------+-------------+----------+--------------+ Dist upper arm                          not visualized +-----------------+-------------+----------+--------------+ Antecubital fossa                       not visualized +-----------------+-------------+----------+--------------+ Prox forearm                            not visualized +-----------------+-------------+----------+--------------+ Mid forearm                             not visualized +-----------------+-------------+----------+--------------+ Distal forearm                          not visualized +-----------------+-------------+----------+--------------+ Elbow                                   not visualized +-----------------+-------------+----------+--------------+ Wrist                                   not visualized +-----------------+-------------+----------+--------------+ *See table(s) above for measurements and  observations.  Diagnosing physician: Deitra Mayo MD Electronically signed by Deitra Mayo MD on 08/07/2019 at 3:08:52 PM.    Final    Vas [place] Upper Extremity Venous Duplex  Result Date: 08/04/2019 UPPER VENOUS STUDY  Indications: Swelling Comparison Study: No prior study. Performing Technologist: Maudry Mayhew MHA, RDMS, RVT, RDCS  Examination Guidelines: A complete evaluation includes B-mode imaging, spectral Doppler, color Doppler, and power Doppler as needed of all accessible portions of each vessel. Bilateral testing is considered an integral part of a complete examination. Limited examinations for reoccurring indications may be performed as noted.  Right Findings: +----------+------------+---------+-----------+----------+---------------------+ RIGHT     CompressiblePhasicitySpontaneousProperties       Summary        +----------+------------+---------+-----------+----------+---------------------+ Subclavian                                           Unable to visualize                                                        due to bandaging    +----------+------------+---------+-----------+----------+---------------------+  Left Findings: +----------+------------+---------+-----------+----------+-------+ LEFT      CompressiblePhasicitySpontaneousPropertiesSummary +----------+------------+---------+-----------+----------+-------+ IJV  Full       Yes       Yes                      +----------+------------+---------+-----------+----------+-------+ Subclavian    Full       Yes       Yes                      +----------+------------+---------+-----------+----------+-------+ Axillary      Full       Yes       Yes                      +----------+------------+---------+-----------+----------+-------+ Brachial      Full       Yes       Yes                      +----------+------------+---------+-----------+----------+-------+ Radial         Full                                          +----------+------------+---------+-----------+----------+-------+ Cephalic      Full                                          +----------+------------+---------+-----------+----------+-------+ Unable to visualize left basilic or ulnar veins.  Summary:  Left: No evidence of deep vein thrombosis in the upper extremity. No evidence of superficial vein thrombosis in the upper extremity. This was a limited study.  *See table(s) above for measurements and observations.  Diagnosing physician: Monica Martinez MD Electronically signed by Monica Martinez MD on 08/04/2019 at 10:42:49 AM.    Final       Subjective: "It's a good thing you came now."  Discharge Exam: Vitals:   08/10/19 0953 08/10/19 1026  BP: (!) 141/52 (!) 151/55  Pulse: 73 71  Resp: 12 14  Temp: (!) 97 F (36.1 C) (!) 97.5 F (36.4 C)  SpO2: 96% 100%   Vitals:   08/10/19 0925 08/10/19 0940 08/10/19 0953 08/10/19 1026  BP: (!) 147/50 (!) 140/50 (!) 141/52 (!) 151/55  Pulse: 72 71 73 71  Resp: 12 15 12 14   Temp: (!) 97 F (36.1 C)  (!) 97 F (36.1 C) (!) 97.5 F (36.4 C)  TempSrc:    Oral  SpO2: 96% 95% 96% 100%  Weight:      Height:        General:82 y.o.femaleresting in bed in NAD Cardiovascular: RRR, +S1, S2, no m/g/r Respiratory: CTABL, no w/r/r GI: BS+, NDNT, no masses noted MSK: Noc/c; LUE swelling improved Neuro: A&O x 3, no focal deficits Psyc: Appropriate interaction and affect, calm/cooperative    The results of significant diagnostics from this hospitalization (including imaging, microbiology, ancillary and laboratory) are listed below for reference.     Microbiology: Recent Results (from the past 240 hour(s))  Blood culture (routine x 2)     Status: None   Collection Time: 08/02/19  9:49 PM   Specimen: BLOOD  Result Value Ref Range Status   Specimen Description   Final    BLOOD RIGHT ANTECUBITAL Performed at [facility] [address], [place], [place] [address]    Special  Requests   Final    BOTTLES DRAWN AEROBIC AND ANAEROBIC Blood Culture adequate volume Performed at [facility] [address], [place], [place] [address]    Culture   Final    NO GROWTH 5 DAYS Performed at [facility], [place] [address] [place], [place] [address]    Report Status 08/08/2019 FINAL  Final  Blood culture (routine x 2)     Status: None   Collection Time: 08/02/19  9:49 PM   Specimen: BLOOD  Result Value Ref Range Status   Specimen Description   Final    BLOOD RIGHT ANTECUBITAL Performed at [facility] [address] [place], [place] [address]    Special Requests   Final    BOTTLES DRAWN AEROBIC AND ANAEROBIC Blood Culture results may not be optimal due to an inadequate volume of blood received in culture bottles Performed at [facility] [address], [place], [place] [address]    Culture   Final    NO GROWTH 5 DAYS Performed at [facility], [place] [address], [place], [place] [address]    Report Status 08/08/2019 FINAL  Final  SARS CORONAVIRUS 2 (TAT 6-24 HRS) Nasopharyngeal Nasal Mucosa     Status: None   Collection Time: 08/03/19  2:40 AM   Specimen: Nasal Mucosa; Nasopharyngeal  Result Value Ref Range Status   SARS Coronavirus 2 NEGATIVE NEGATIVE Final    Comment: (NOTE) SARS-CoV-2 target nucleic acids are NOT DETECTED. The SARS-CoV-2 RNA is generally detectable in upper and lower respiratory specimens during the acute phase of infection. Negative results do not preclude SARS-CoV-2 infection, do not rule out co-infections with other pathogens, and should not be used as the sole basis for treatment or other patient management decisions. Negative results must be combined with clinical observations, patient history, and epidemiological information. The expected result is Negative. Fact Sheet for  Patients: SugarRoll.be Fact Sheet for Healthcare Providers: https://www.woods-mathews.com/ This test is not yet approved or cleared by the [place] FDA and  has been authorized for detection and/or diagnosis of SARS-CoV-2 by FDA under an Emergency Use Authorization (EUA). This EUA will remain  in effect (meaning this test can be used) for the duration of the COVID-19 declaration under Section 56 4(b)(1) of the Act, 21 U.S.C. section 360bbb-3(b)(1), unless the authorization is terminated or revoked sooner. Performed at [facility], [place] [address], [place], [place] [address]   MRSA PCR Screening     Status: None   Collection Time: 08/03/19  2:40 AM  Result Value Ref Range Status   MRSA by PCR NEGATIVE NEGATIVE Final    Comment:        The GeneXpert MRSA Assay (FDA approved for NASAL specimens only), is one component of a comprehensive MRSA colonization surveillance program. It is not intended to diagnose MRSA infection nor to guide or monitor treatment for MRSA infections. Performed at [facility], [place] [address] [place], [place] [address]   Surgical pcr screen     Status: None   Collection Time: 08/09/19 11:44 PM   Specimen: Nasal Mucosa; Nasal Swab  Result Value Ref Range Status   MRSA, PCR NEGATIVE NEGATIVE Final   Staphylococcus aureus NEGATIVE NEGATIVE Final    Comment: (NOTE) The Xpert SA Assay (FDA approved for NASAL specimens in patients 60 years of age and older), is one component of a comprehensive surveillance program. It is not intended to diagnose infection nor  to guide or monitor treatment. Performed at [facility], [place] [address] [place], [place] [address]      Labs: BNP (last 3 results) Recent Labs    04/07/19 2157 08/02/19 2149  BNP 294.1* 681.1*   Basic Metabolic Panel: Recent Labs  Lab 08/06/19 0719 08/07/19 1534 08/08/19 0227 08/09/19 0239 08/10/19 0248  NA 139 136 136  137 136  K 3.8 4.2 3.5 3.2* 4.0  CL 104 103 104 103 103  CO2 25 21* 23 26 22   GLUCOSE 103* 247* 153* 141* 152*  BUN 54* 33* 44* 23 49*  CREATININE 4.17* 3.49* 3.95* 2.30* 3.70*  CALCIUM 6.4* 7.3* 6.9* 6.9* 6.9*  MG  --   --   --  1.4* 2.1  PHOS 4.7* 4.4 4.4 3.2 3.9   Liver Function Tests: Recent Labs  Lab 08/04/19 0754  08/06/19 0719 08/07/19 1534 08/08/19 0227 08/09/19 0239 08/10/19 0248  AST 14*  --   --   --   --   --   --   ALT 17  --   --   --   --   --   --   ALKPHOS 38  --   --   --   --   --   --   BILITOT 0.2*  --   --   --   --   --   --   PROT 4.4*  --   --   --   --   --   --   ALBUMIN 1.8*   < > 1.8* 1.9* 1.6* 1.7* 1.8*   < > = values in this interval not displayed.   No results for input(s): LIPASE, AMYLASE in the last 168 hours. No results for input(s): AMMONIA in the last 168 hours. CBC: Recent Labs  Lab 08/07/19 0311 08/07/19 1534 08/08/19 0227 08/09/19 0239 08/10/19 0248  WBC 5.1 4.4 6.4 6.3 4.7  NEUTROABS  --   --   --   --  3.6  HGB 9.0* 10.7* 8.4* 7.9* 8.8*  HCT 26.4* 31.9* 25.4* 24.0* 27.1*  MCV 94.0 93.5 95.1 95.2 95.8  PLT 105* 109* 103* 104* 104*   Cardiac Enzymes: No results for input(s): CKTOTAL, CKMB, CKMBINDEX, TROPONINI in the last 168 hours. BNP: Invalid input(s): POCBNP CBG: Recent Labs  Lab 08/08/19 2118 08/09/19 0811 08/09/19 1143 08/09/19 1623 08/09/19 2108  GLUCAP 254* 104* 173* 255* 137*   D-Dimer No results for input(s): DDIMER in the last 72 hours. Hgb A1c No results for input(s): HGBA1C in the last 72 hours. Lipid Profile No results for input(s): CHOL, HDL, LDLCALC, TRIG, CHOLHDL, LDLDIRECT in the last 72 hours. Thyroid function studies No results for input(s): TSH, T4TOTAL, T3FREE, THYROIDAB in the last 72 hours.  Invalid input(s): FREET3 Anemia work up No results for input(s): VITAMINB12, FOLATE, FERRITIN, TIBC, IRON, RETICCTPCT in the last 72 hours. Urinalysis    Component Value Date/Time   COLORURINE  YELLOW 08/02/2019 2254   APPEARANCEUR CLEAR 08/02/2019 2254   LABSPEC 1.010 08/02/2019 2254   PHURINE 5.0 08/02/2019 2254   GLUCOSEU 50 (A) 08/02/2019 2254   HGBUR NEGATIVE 08/02/2019 2254   BILIRUBINUR NEGATIVE 08/02/2019 2254   KETONESUR NEGATIVE 08/02/2019 2254   PROTEINUR NEGATIVE 08/02/2019 2254   UROBILINOGEN 0.2 12/29/2014 1400   NITRITE NEGATIVE 08/02/2019 2254   LEUKOCYTESUR NEGATIVE 08/02/2019 2254   Sepsis Labs Invalid input(s): PROCALCITONIN,  WBC,  LACTICIDVEN Microbiology Recent Results (from the past 240 hour(s))  Blood  culture (routine x 2)     Status: None   Collection Time: 08/02/19  9:49 PM   Specimen: BLOOD  Result Value Ref Range Status   Specimen Description   Final    BLOOD RIGHT ANTECUBITAL Performed at [facility] [address], [place], [place] [address]    Special Requests   Final    BOTTLES DRAWN AEROBIC AND ANAEROBIC Blood Culture adequate volume Performed at [facility] [address] [place], [place] [address]    Culture   Final    NO GROWTH 5 DAYS Performed at [facility], [place] [address] [place], [place] [address]    Report Status 08/08/2019 FINAL  Final  Blood culture (routine x 2)     Status: None   Collection Time: 08/02/19  9:49 PM   Specimen: BLOOD  Result Value Ref Range Status   Specimen Description   Final    BLOOD RIGHT ANTECUBITAL Performed at [facility] [address] [place], [place] [address]    Special Requests   Final    BOTTLES DRAWN AEROBIC AND ANAEROBIC Blood Culture results may not be optimal due to an inadequate volume of blood received in culture bottles Performed at [facility] [address], [place], [place] [address]    Culture   Final    NO GROWTH 5 DAYS Performed at [facility], [place] [address] [place], [place] [address]    Report Status 08/08/2019 FINAL  Final  SARS CORONAVIRUS 2 (TAT 6-24 HRS)  Nasopharyngeal Nasal Mucosa     Status: None   Collection Time: 08/03/19  2:40 AM   Specimen: Nasal Mucosa; Nasopharyngeal  Result Value Ref Range Status   SARS Coronavirus 2 NEGATIVE NEGATIVE Final    Comment: (NOTE) SARS-CoV-2 target nucleic acids are NOT DETECTED. The SARS-CoV-2 RNA is generally detectable in upper and lower respiratory specimens during the acute phase of infection. Negative results do not preclude SARS-CoV-2 infection, do not rule out co-infections with other pathogens, and should not be used as the sole basis for treatment or other patient management decisions. Negative results must be combined with clinical observations, patient history, and epidemiological information. The expected result is Negative. Fact Sheet for Patients: SugarRoll.be Fact Sheet for Healthcare Providers: https://www.woods-mathews.com/ This test is not yet approved or cleared by the [place] FDA and  has been authorized for detection and/or diagnosis of SARS-CoV-2 by FDA under an Emergency Use Authorization (EUA). This EUA will remain  in effect (meaning this test can be used) for the duration of the COVID-19 declaration under Section 56 4(b)(1) of the Act, 21 U.S.C. section 360bbb-3(b)(1), unless the authorization is terminated or revoked sooner. Performed at [facility], [place] [address], [place], [place] [address]   MRSA PCR Screening     Status: None   Collection Time: 08/03/19  2:40 AM  Result Value Ref Range Status   MRSA by PCR NEGATIVE NEGATIVE Final    Comment:        The GeneXpert MRSA Assay (FDA approved for NASAL specimens only), is one component of a comprehensive MRSA colonization surveillance program. It is not intended to diagnose MRSA infection nor to guide or monitor treatment for MRSA infections. Performed at [facility], [place] [address], [place],  [address]   Surgical pcr screen     Status: None    Collection Time: 08/09/19 11:44 PM   Specimen: Nasal Mucosa; Nasal  Swab  Result Value Ref Range Status   MRSA, PCR NEGATIVE NEGATIVE Final   Staphylococcus aureus NEGATIVE NEGATIVE Final    Comment: (NOTE) The Xpert SA Assay (FDA approved for NASAL specimens in patients 86 years of age and older), is one component of a comprehensive surveillance program. It is not intended to diagnose infection nor to guide or monitor treatment. Performed at [facility], [place] [address] [place], [place] [address]      Time coordinating discharge: 35 minutes  SIGNED:   Jonnie Finner, DO  [facility] 08/10/2019, 12:49 PM Pager   If 7PM-7AM, please contact night-coverage [email or web address] Password [email or web address]

## 2019-08-10 NOTE — Interval H&P Note (Signed)
History and Physical Interval Note:  08/10/2019 7:30 AM  Samantha Mckenzie  has presented today for surgery, with the diagnosis of CHRONIC KIDNEY DISEASE.  The various methods of treatment have been discussed with the patient and family. After consideration of risks, benefits and other options for treatment, the patient has consented to  Procedure(s): ARTERIOVENOUS (AV) FISTULA CREATION VERSUS GRAFT PLACEMENT (Right) as a surgical intervention.  The patient's history has been reviewed, patient examined, no change in status, stable for surgery.  I have reviewed the patient's chart and labs.  Questions were answered to the patient's satisfaction.     Deitra Mayo

## 2019-08-10 NOTE — Progress Notes (Signed)
[facility] ROUNDING NOTE   Subjective:   This is a very nice 83 year old lady she is originally from [place].  She has a longstanding history of hypertension coronary disease dyslipidemia breast cancer status postmastectomy radiation treatment 20 years ago, she also has a history of Crohn's disease.  She has been on chronic prednisone therapy..  Lymphedema chronic which is of the left upper extremity.  She is stage IV chronic kidney disease with baseline serum creatinine 1.9-2.1.  She has had a progressive rise in the creatinine and she underwent renal biopsy and initiated dialysis for creatinine 4.1.  No identifiable nephrotoxins were seen.  Her renal biopsy was consistent with diffuse marked acute tubular injury moderate interstitial fibrosis and tubular atrophy with no evidence of immune complex GN.  This may be end-stage renal disease with progression.  It is difficult to tell at this time.  She did undergo placement of AV graft versus fistula and appreciate assistance from Dr. Scot Dock.  She has been accepted at [facility] kidney center and will dialyze on a TTS schedule at 11 AM.  Blood pressure 151/55 pulse 71 temperature 97.5 O2 sats 100% on room air  Sodium 136 potassium 4.0 chloride 103 CO2 22 BUN 49 creatinine 3.7 glucose 152 calcium 6.9 phosphorus 3.9 magnesium 2.1 WBC 4.7 hemoglobin 8.8 platelets 104  Norvasc 10 mg daily atorvastatin 20 mg daily Calcitrol 0.25 mcg daily carvedilol 6.25 mg twice daily, hydralazine 10 mg every 8 hours, Protonix 40 mg twice daily potassium supplementation 20 mEq twice daily, prednisone 40 mg daily.  Rocephin 1 g day  Objective:  Vital signs in last 24 hours:  Temp:  [97 F (36.1 C)-98.4 F (36.9 C)] 97.5 F (36.4 C) (10/30 1026) Pulse Rate:  [71-85] 71 (10/30 1026) Resp:  [12-18] 14 (10/30 1026) BP: (119-151)/(50-59) 151/55 (10/30 1026) SpO2:  [95 %-100 %] 100 % (10/30 1026)  Weight change:  Filed Weights   08/08/19 1120  08/08/19 1530 08/09/19 0657  Weight: 55.4 kg 53.1 kg 118 kg    Intake/Output: No intake/output data recorded.   Intake/Output this shift:  Total I/O In: 300 [I.V.:300] Out: 10 [Blood:10]  General: awake, alert, pleasant female lying in bed in NAD Heart: RRR Lungs: CTAB Abdomen: soft, non-tender, non-distended  Extremities: Chronic LUE lymphedema; no LE edema    Basic Metabolic Panel: Recent Labs  Lab 08/06/19 0719 08/07/19 1534 08/08/19 0227 08/09/19 0239 08/10/19 0248  NA 139 136 136 137 136  K 3.8 4.2 3.5 3.2* 4.0  CL 104 103 104 103 103  CO2 25 21* 23 26 22   GLUCOSE 103* 247* 153* 141* 152*  BUN 54* 33* 44* 23 49*  CREATININE 4.17* 3.49* 3.95* 2.30* 3.70*  CALCIUM 6.4* 7.3* 6.9* 6.9* 6.9*  MG  --   --   --  1.4* 2.1  PHOS 4.7* 4.4 4.4 3.2 3.9    Liver Function Tests: Recent Labs  Lab 08/04/19 0754  08/06/19 0719 08/07/19 1534 08/08/19 0227 08/09/19 0239 08/10/19 0248  AST 14*  --   --   --   --   --   --   ALT 17  --   --   --   --   --   --   ALKPHOS 38  --   --   --   --   --   --   BILITOT 0.2*  --   --   --   --   --   --  PROT 4.4*  --   --   --   --   --   --   ALBUMIN 1.8*   < > 1.8* 1.9* 1.6* 1.7* 1.8*   < > = values in this interval not displayed.   No results for input(s): LIPASE, AMYLASE in the last 168 hours. No results for input(s): AMMONIA in the last 168 hours.  CBC: Recent Labs  Lab 08/07/19 0311 08/07/19 1534 08/08/19 0227 08/09/19 0239 08/10/19 0248  WBC 5.1 4.4 6.4 6.3 4.7  NEUTROABS  --   --   --   --  3.6  HGB 9.0* 10.7* 8.4* 7.9* 8.8*  HCT 26.4* 31.9* 25.4* 24.0* 27.1*  MCV 94.0 93.5 95.1 95.2 95.8  PLT 105* 109* 103* 104* 104*    Cardiac Enzymes: No results for input(s): CKTOTAL, CKMB, CKMBINDEX, TROPONINI in the last 168 hours.  BNP: Invalid input(s): POCBNP  CBG: Recent Labs  Lab 08/08/19 2118 08/09/19 0811 08/09/19 1143 08/09/19 1623 08/09/19 2108  GLUCAP 254* 104* 173* 83* 58*     Microbiology: Results for orders placed or performed during the hospital encounter of 08/02/19  Blood culture (routine x 2)     Status: None   Collection Time: 08/02/19  9:49 PM   Specimen: BLOOD  Result Value Ref Range Status   Specimen Description   Final    BLOOD RIGHT ANTECUBITAL Performed at [facility], [place] [address], [place], [place] [address]    Special Requests   Final    BOTTLES DRAWN AEROBIC AND ANAEROBIC Blood Culture adequate volume Performed at [facility] [address] [place], [place] [address]    Culture   Final    NO GROWTH 5 DAYS Performed at [facility], [place] [address] [place], [place] [address]    Report Status 08/08/2019 FINAL  Final  Blood culture (routine x 2)     Status: None   Collection Time: 08/02/19  9:49 PM   Specimen: BLOOD  Result Value Ref Range Status   Specimen Description   Final    BLOOD RIGHT ANTECUBITAL Performed at [facility] [address], [place], [place] [address]    Special Requests   Final    BOTTLES DRAWN AEROBIC AND ANAEROBIC Blood Culture results may not be optimal due to an inadequate volume of blood received in culture bottles Performed at [facility] [address] [place], [place] [address]    Culture   Final    NO GROWTH 5 DAYS Performed at [facility], [place] [address], [place], [place] [address]    Report Status 08/08/2019 FINAL  Final  SARS CORONAVIRUS 2 (TAT 6-24 HRS) Nasopharyngeal Nasal Mucosa     Status: None   Collection Time: 08/03/19  2:40 AM   Specimen: Nasal Mucosa; Nasopharyngeal  Result Value Ref Range Status   SARS Coronavirus 2 NEGATIVE NEGATIVE Final    Comment: (NOTE) SARS-CoV-2 target nucleic acids are NOT DETECTED. The SARS-CoV-2 RNA is generally detectable in upper and lower respiratory specimens during the acute phase of infection. Negative results do not preclude SARS-CoV-2 infection, do not  rule out co-infections with other pathogens, and should not be used as the sole basis for treatment or other patient management decisions. Negative results must be combined with clinical observations, patient history, and epidemiological information. The expected result is Negative. Fact Sheet for Patients: SugarRoll.be Fact Sheet for Healthcare Providers: https://www.woods-mathews.com/ This test is not yet approved  or cleared by the [place] and  has been authorized for detection and/or diagnosis of SARS-CoV-2 by FDA under an Emergency Use Authorization (EUA). This EUA will remain  in effect (meaning this test can be used) for the duration of the COVID-19 declaration under Section 56 4(b)(1) of the Act, 21 U.S.C. section 360bbb-3(b)(1), unless the authorization is terminated or revoked sooner. Performed at [facility], [place] [address], [place], [place] [address]   MRSA PCR Screening     Status: None   Collection Time: 08/03/19  2:40 AM  Result Value Ref Range Status   MRSA by PCR NEGATIVE NEGATIVE Final    Comment:        The GeneXpert MRSA Assay (FDA approved for NASAL specimens only), is one component of a comprehensive MRSA colonization surveillance program. It is not intended to diagnose MRSA infection nor to guide or monitor treatment for MRSA infections. Performed at [facility], [place] [address] [place], [place] [address]   Surgical pcr screen     Status: None   Collection Time: 08/09/19 11:44 PM   Specimen: Nasal Mucosa; Nasal Swab  Result Value Ref Range Status   MRSA, PCR NEGATIVE NEGATIVE Final   Staphylococcus aureus NEGATIVE NEGATIVE Final    Comment: (NOTE) The Xpert SA Assay (FDA approved for NASAL specimens in patients 74 years of age and older), is one component of a comprehensive surveillance program. It is not intended to diagnose infection nor to guide or monitor treatment. Performed at  [facility], [place] [address] [place], [place] [address]     Coagulation Studies: No results for input(s): LABPROT, INR in the last 72 hours.  Urinalysis: No results for input(s): COLORURINE, LABSPEC, PHURINE, GLUCOSEU, HGBUR, BILIRUBINUR, KETONESUR, PROTEINUR, UROBILINOGEN, NITRITE, LEUKOCYTESUR in the last 72 hours.  Invalid input(s): APPERANCEUR    Imaging: No results found.   Medications:   . sodium chloride    . cefTRIAXone (ROCEPHIN)  IV 1 g (08/09/19 2249)   . amLODipine  10 mg Oral Daily  . atorvastatin  20 mg Oral Daily  . calcitRIOL  0.25 mcg Oral Daily  . carvedilol  6.25 mg Oral BID WC  . Chlorhexidine Gluconate Cloth  6 each Topical Q0600  . Chlorhexidine Gluconate Cloth  6 each Topical Q0600  . Chlorhexidine Gluconate Cloth  6 each Topical Q0600  . hydrALAZINE  10 mg Oral Q8H  . insulin aspart  0-9 Units Subcutaneous TID WC  . pantoprazole  40 mg Oral BID  . potassium chloride  20 mEq Oral BID  . predniSONE  40 mg Oral Q breakfast  . sodium chloride flush  3 mL Intravenous Q12H  . sodium chloride flush  3 mL Intravenous Q12H   sodium chloride, acetaminophen **OR** acetaminophen, HYDROcodone-acetaminophen, loperamide, ondansetron **OR** ondansetron (ZOFRAN) IV, sodium chloride flush  Assessment/ Plan:   Acute on chronic renal sufficiency stage IV baseline serum creatinine 1.9-2.1.  She is now dialysis dependent and has been receiving dialysis since 08/03/2019.  She is having placement of an AV fistula in anticipation that she will require long-term dialysis.  She also has a dialysis cath which appears to be functioning well.  She will be placed on dialysis at [facility] kidney center dialyzing on a Tuesday Thursday Saturday schedule.  She received dialysis 08/11/2019  Hypertension/volume appears to be adequately controlled at this point.  Diabetes mellitus appears to be stable  Chronic lymphedema with lower extremity swelling.  There is a  questionable cellulitis on this area and she was treated with Rocephin.  Crohn's disease appears that she is on 40 mg daily prednisone that will need to be tapered.  Cellulitis.  Patient on IV Rocephin this will need to be switched to oral agents if she needs to be continued on antibiotics.  Bone mineral.  Continues on calcitriol 0.25 mcg daily  Anemia hemoglobin 8.8 we will check iron status and start darbepoetin   LOS: [place] @TODAY @11 :39 AM

## 2019-08-10 NOTE — Transfer of Care (Signed)
Immediate Anesthesia Transfer of Care Note  Patient: Samantha Mckenzie  Procedure(s) Performed: ARTERIOVENOUS (AV) FISTULA CREATION VERSUS GRAFT PLACEMENT (Right Arm Upper)  Patient Location: PACU  Anesthesia Type:MAC  Level of Consciousness: drowsy and patient cooperative  Airway & Oxygen Therapy: Patient Spontanous Breathing  Post-op Assessment: Report given to RN and Post -op Vital signs reviewed and stable  Post vital signs: Reviewed and stable  Last Vitals:  Vitals Value Taken Time  BP 147/50 08/10/19 0926  Temp    Pulse 74 08/10/19 0928  Resp 21 08/10/19 0928  SpO2 96 % 08/10/19 0928  Vitals shown include unvalidated device data.  Last Pain:  Vitals:   08/09/19 2130  TempSrc:   PainSc: 0-No pain         Complications: No apparent anesthesia complications

## 2019-08-10 NOTE — Discharge Instructions (Signed)
Cellulitis, Adult  Cellulitis is a skin infection. The infected area is often warm, red, swollen, and sore. It occurs most often in the arms and lower legs. It is very important to get treated for this condition. What are the causes? This condition is caused by bacteria. The bacteria enter through a break in the skin, such as a cut, burn, insect bite, open sore, or crack. What increases the risk? This condition is more likely to occur in people who:  Have a weak body defense system (immune system).  Have open cuts, burns, bites, or scrapes on the skin.  Are older than 81 years of age.  Have a blood sugar problem (diabetes).  Have a long-lasting (chronic) liver disease (cirrhosis) or kidney disease.  Are very overweight (obese).  Have a skin problem, such as: ? Itchy rash (eczema). ? Slow movement of blood in the veins (venous stasis). ? Fluid buildup below the skin (edema).  Have been treated with high-energy rays (radiation).  Use IV drugs. What are the signs or symptoms? Symptoms of this condition include:  Skin that is: ? Red. ? Streaking. ? Spotting. ? Swollen. ? Sore or painful when you touch it. ? Warm.  A fever.  Chills.  Blisters. How is this diagnosed? This condition is diagnosed based on:  Medical history.  Physical exam.  Blood tests.  Imaging tests. How is this treated? Treatment for this condition may include:  Medicines to treat infections or allergies.  Home care, such as: ? Rest. ? Placing cold or warm cloths (compresses) on the skin.  Hospital care, if the condition is very bad. Follow these instructions at home: Medicines  Take over-the-counter and prescription medicines only as told by your doctor.  If you were prescribed an antibiotic medicine, take it as told by your doctor. Do not stop taking it even if you start to feel better. General instructions   Drink enough fluid to keep your pee (urine) pale yellow.  Do not touch  or rub the infected area.  Raise (elevate) the infected area above the level of your heart while you are sitting or lying down.  Place cold or warm cloths on the area as told by your doctor.  Keep all follow-up visits as told by your doctor. This is important. Contact a doctor if:  You have a fever.  You do not start to get better after 1-2 days of treatment.  Your bone or joint under the infected area starts to hurt after the skin has healed.  Your infection comes back. This can happen in the same area or another area.  You have a swollen bump in the area.  You have new symptoms.  You feel ill and have muscle aches and pains. Get help right away if:  Your symptoms get worse.  You feel very sleepy.  You throw up (vomit) or have watery poop (diarrhea) for a long time.  You see red streaks coming from the area.  Your red area gets larger.  Your red area turns dark in color. These symptoms may represent a serious problem that is an emergency. Do not wait to see if the symptoms will go away. Get medical help right away. Call your local emergency services (911 in the [place] Do not drive yourself to the hospital. Summary  Cellulitis is a skin infection. The area is often warm, red, swollen, and sore.  This condition is treated with medicines, rest, and cold and warm cloths.  Take all medicines only  as told by your doctor.  Tell your doctor if symptoms do not start to get better after 1-2 days of treatment. This information is not intended to replace advice given to you by your health care provider. Make sure you discuss any questions you have with your health care provider. Document Released: 03/15/2008 Document Revised: 02/16/2018 Document Reviewed: 02/16/2018 Elsevier Patient Education  2020 [place].    Chronic Kidney Disease, Adult Chronic kidney disease (CKD) happens when the kidneys are damaged over a long period of time. The kidneys are two organs that help  with:  Getting rid of waste and extra fluid from the blood.  Making hormones that maintain the amount of fluid in your tissues and blood vessels.  Making sure that the body has the right amount of fluids and chemicals. Most of the time, CKD does not go away, but it can usually be controlled. Steps must be taken to slow down the kidney damage or to stop it from getting worse. If this is not done, the kidneys may stop working. Follow these instructions at home: Medicines  Take over-the-counter and prescription medicines only as told by your doctor. You may need to change the amount of medicines you take.  Do not take any new medicines unless your doctor says it is okay. Many medicines can make your kidney damage worse.  Do not take any vitamin and supplements unless your doctor says it is okay. Many vitamins and supplements can make your kidney damage worse. General instructions  Follow a diet as told by your doctor. You may need to stay away from: ? Alcohol. ? Salty foods. ? Foods that are high in:  Potassium.  Calcium.  Protein.  Do not use any products that contain nicotine or tobacco, such as cigarettes and e-cigarettes. If you need help quitting, ask your doctor.  Keep track of your blood pressure at home. Tell your doctor about any changes.  If you have diabetes, keep track of your blood sugar as told by your doctor.  Try to stay at a healthy weight. If you need help, ask your doctor.  Exercise at least 30 minutes a day, 5 days a week.  Stay up-to-date with your shots (immunizations) as told by your doctor.  Keep all follow-up visits as told by your doctor. This is important. Contact a doctor if:  Your symptoms get worse.  You have new symptoms. Get help right away if:  You have symptoms of end-stage kidney disease. These may include: ? Headaches. ? Numbness in your hands or feet. ? Easy bruising. ? Having hiccups often. ? Chest pain. ? Shortness of  breath. ? Stopping of menstrual periods in women.  You have a fever.  You have very little pee (urine).  You have pain or bleeding when you pee. Summary  Chronic kidney disease (CKD) happens when the kidneys are damaged over a long period of time.  Most of the time, this condition does not go away, but it can usually be controlled. Steps must be taken to slow down the kidney damage or to stop it from getting worse.  Treatment may include a combination of medicines and lifestyle changes. This information is not intended to replace advice given to you by your health care provider. Make sure you discuss any questions you have with your health care provider. Document Released: 12/22/2009 Document Revised: 09/09/2017 Document Reviewed: 11/01/2016 Elsevier Patient Education  [place].   Cellulitis, Adult  Cellulitis is a skin infection. The  infected area is usually warm, red, swollen, and tender. This condition occurs most often in the arms and lower legs. The infection can travel to the muscles, blood, and underlying tissue and become serious. It is very important to get treated for this condition. What are the causes? Cellulitis is caused by bacteria. The bacteria enter through a break in the skin, such as a cut, burn, insect bite, open sore, or crack. What increases the risk? This condition is more likely to occur in people who:  Have a weak body defense system (immune system).  Have open wounds on the skin, such as cuts, burns, bites, and scrapes. Bacteria can enter the body through these open wounds.  Are older than 81 years of age.  Have diabetes.  Have a type of long-lasting (chronic) liver disease (cirrhosis) or kidney disease.  Are obese.  Have a skin condition such as: ? Itchy rash (eczema). ? Slow movement of blood in the veins (venous stasis). ? Fluid buildup below the skin (edema).  Have had radiation therapy.  Use IV drugs. What are the signs or  symptoms? Symptoms of this condition include:  Redness, streaking, or spotting on the skin.  Swollen area of the skin.  Tenderness or pain when an area of the skin is touched.  Warm skin.  A fever.  Chills.  Blisters. How is this diagnosed? This condition is diagnosed based on a medical history and physical exam. You may also have tests, including:  Blood tests.  Imaging tests. How is this treated? Treatment for this condition may include:  Medicines, such as antibiotic medicines or medicines to treat allergies (antihistamines).  Supportive care, such as rest and application of cold or warm cloths (compresses) to the skin.  Hospital care, if the condition is severe. The infection usually starts to get better within 1-2 days of treatment. Follow these instructions at home:  Medicines  Take over-the-counter and prescription medicines only as told by your health care provider.  If you were prescribed an antibiotic medicine, take it as told by your health care provider. Do not stop taking the antibiotic even if you start to feel better. General instructions  Drink enough fluid to keep your urine pale yellow.  Do not touch or rub the infected area.  Raise (elevate) the infected area above the level of your heart while you are sitting or lying down.  Apply warm or cold compresses to the affected area as told by your health care provider.  Keep all follow-up visits as told by your health care provider. This is important. These visits let your health care provider make sure a more serious infection is not developing. Contact a health care provider if:  You have a fever.  Your symptoms do not begin to improve within 1-2 days of starting treatment.  Your bone or joint underneath the infected area becomes painful after the skin has healed.  Your infection returns in the same area or another area.  You notice a swollen bump in the infected area.  You develop new  symptoms.  You have a general ill feeling (malaise) with muscle aches and pains. Get help right away if:  Your symptoms get worse.  You feel very sleepy.  You develop vomiting or diarrhea that persists.  You notice red streaks coming from the infected area.  Your red area gets larger or turns dark in color. These symptoms may represent a serious problem that is an emergency. Do not wait to see if the  symptoms will go away. Get medical help right away. Call your local emergency services (911 in the [place] Do not drive yourself to the hospital. Summary  Cellulitis is a skin infection. This condition occurs most often in the arms and lower legs.  Treatment for this condition may include medicines, such as antibiotic medicines or antihistamines.  Take over-the-counter and prescription medicines only as told by your health care provider. If you were prescribed an antibiotic medicine, do not stop taking the antibiotic even if you start to feel better.  Contact a health care provider if your symptoms do not begin to improve within 1-2 days of starting treatment or your symptoms get worse.  Keep all follow-up visits as told by your health care provider. This is important. These visits let your health care provider make sure that a more serious infection is not developing. This information is not intended to replace advice given to you by your health care provider. Make sure you discuss any questions you have with your health care provider. Document Released: 07/07/2005 Document Revised: 02/16/2018 Document Reviewed: 02/16/2018 Elsevier Patient Education  [place].   Chronic Kidney Disease, Adult Chronic kidney disease (CKD) happens when the kidneys are damaged over a long period of time. The kidneys are two organs that help with:  Getting rid of waste and extra fluid from the blood.  Making hormones that maintain the amount of fluid in your tissues and blood vessels.  Making  sure that the body has the right amount of fluids and chemicals. Most of the time, CKD does not go away, but it can usually be controlled. Steps must be taken to slow down the kidney damage or to stop it from getting worse. If this is not done, the kidneys may stop working. Follow these instructions at home: Medicines  Take over-the-counter and prescription medicines only as told by your doctor. You may need to change the amount of medicines you take.  Do not take any new medicines unless your doctor says it is okay. Many medicines can make your kidney damage worse.  Do not take any vitamin and supplements unless your doctor says it is okay. Many vitamins and supplements can make your kidney damage worse. General instructions  Follow a diet as told by your doctor. You may need to stay away from: ? Alcohol. ? Salty foods. ? Foods that are high in:  Potassium.  Calcium.  Protein.  Do not use any products that contain nicotine or tobacco, such as cigarettes and e-cigarettes. If you need help quitting, ask your doctor.  Keep track of your blood pressure at home. Tell your doctor about any changes.  If you have diabetes, keep track of your blood sugar as told by your doctor.  Try to stay at a healthy weight. If you need help, ask your doctor.  Exercise at least 30 minutes a day, 5 days a week.  Stay up-to-date with your shots (immunizations) as told by your doctor.  Keep all follow-up visits as told by your doctor. This is important. Contact a doctor if:  Your symptoms get worse.  You have new symptoms. Get help right away if:  You have symptoms of end-stage kidney disease. These may include: ? Headaches. ? Numbness in your hands or feet. ? Easy bruising. ? Having hiccups often. ? Chest pain. ? Shortness of breath. ? Stopping of menstrual periods in women.  You have a fever.  You have very little pee (urine).  You have pain  or bleeding when you  pee. Summary  Chronic kidney disease (CKD) happens when the kidneys are damaged over a long period of time.  Most of the time, this condition does not go away, but it can usually be controlled. Steps must be taken to slow down the kidney damage or to stop it from getting worse.  Treatment may include a combination of medicines and lifestyle changes. This information is not intended to replace advice given to you by your health care provider. Make sure you discuss any questions you have with your health care provider. Document Released: 12/22/2009 Document Revised: 09/09/2017 Document Reviewed: 11/01/2016 Elsevier Patient Education  2020 [place].   Chronic Kidney Disease, Adult Chronic kidney disease (CKD) happens when the kidneys are damaged over a long period of time. The kidneys are two organs that help with:  Getting rid of waste and extra fluid from the blood.  Making hormones that maintain the amount of fluid in your tissues and blood vessels.  Making sure that the body has the right amount of fluids and chemicals. Most of the time, CKD does not go away, but it can usually be controlled. Steps must be taken to slow down the kidney damage or to stop it from getting worse. If this is not done, the kidneys may stop working. Follow these instructions at home: Medicines  Take over-the-counter and prescription medicines only as told by your doctor. You may need to change the amount of medicines you take.  Do not take any new medicines unless your doctor says it is okay. Many medicines can make your kidney damage worse.  Do not take any vitamin and supplements unless your doctor says it is okay. Many vitamins and supplements can make your kidney damage worse. General instructions  Follow a diet as told by your doctor. You may need to stay away from: ? Alcohol. ? Salty foods. ? Foods that are high in:  Potassium.  Calcium.  Protein.  Do not use any products that contain  nicotine or tobacco, such as cigarettes and e-cigarettes. If you need help quitting, ask your doctor.  Keep track of your blood pressure at home. Tell your doctor about any changes.  If you have diabetes, keep track of your blood sugar as told by your doctor.  Try to stay at a healthy weight. If you need help, ask your doctor.  Exercise at least 30 minutes a day, 5 days a week.  Stay up-to-date with your shots (immunizations) as told by your doctor.  Keep all follow-up visits as told by your doctor. This is important. Contact a doctor if:  Your symptoms get worse.  You have new symptoms. Get help right away if:  You have symptoms of end-stage kidney disease. These may include: ? Headaches. ? Numbness in your hands or feet. ? Easy bruising. ? Having hiccups often. ? Chest pain. ? Shortness of breath. ? Stopping of menstrual periods in women.  You have a fever.  You have very little pee (urine).  You have pain or bleeding when you pee. Summary  Chronic kidney disease (CKD) happens when the kidneys are damaged over a long period of time.  Most of the time, this condition does not go away, but it can usually be controlled. Steps must be taken to slow down the kidney damage or to stop it from getting worse.  Treatment may include a combination of medicines and lifestyle changes. This information is not intended to replace advice given to you  by your health care provider. Make sure you discuss any questions you have with your health care provider. Document Released: 12/22/2009 Document Revised: 09/09/2017 Document Reviewed: 11/01/2016 Elsevier Patient Education  2020 [place].   Chronic Kidney Disease, Adult Chronic kidney disease (CKD) happens when the kidneys are damaged over a long period of time. The kidneys are two organs that help with:  Getting rid of waste and extra fluid from the blood.  Making hormones that maintain the amount of fluid in your tissues and  blood vessels.  Making sure that the body has the right amount of fluids and chemicals. Most of the time, CKD does not go away, but it can usually be controlled. Steps must be taken to slow down the kidney damage or to stop it from getting worse. If this is not done, the kidneys may stop working. Follow these instructions at home: Medicines  Take over-the-counter and prescription medicines only as told by your doctor. You may need to change the amount of medicines you take.  Do not take any new medicines unless your doctor says it is okay. Many medicines can make your kidney damage worse.  Do not take any vitamin and supplements unless your doctor says it is okay. Many vitamins and supplements can make your kidney damage worse. General instructions  Follow a diet as told by your doctor. You may need to stay away from: ? Alcohol. ? Salty foods. ? Foods that are high in:  Potassium.  Calcium.  Protein.  Do not use any products that contain nicotine or tobacco, such as cigarettes and e-cigarettes. If you need help quitting, ask your doctor.  Keep track of your blood pressure at home. Tell your doctor about any changes.  If you have diabetes, keep track of your blood sugar as told by your doctor.  Try to stay at a healthy weight. If you need help, ask your doctor.  Exercise at least 30 minutes a day, 5 days a week.  Stay up-to-date with your shots (immunizations) as told by your doctor.  Keep all follow-up visits as told by your doctor. This is important. Contact a doctor if:  Your symptoms get worse.  You have new symptoms. Get help right away if:  You have symptoms of end-stage kidney disease. These may include: ? Headaches. ? Numbness in your hands or feet. ? Easy bruising. ? Having hiccups often. ? Chest pain. ? Shortness of breath. ? Stopping of menstrual periods in women.  You have a fever.  You have very little pee (urine).  You have pain or bleeding when  you pee. Summary  Chronic kidney disease (CKD) happens when the kidneys are damaged over a long period of time.  Most of the time, this condition does not go away, but it can usually be controlled. Steps must be taken to slow down the kidney damage or to stop it from getting worse.  Treatment may include a combination of medicines and lifestyle changes. This information is not intended to replace advice given to you by your health care provider. Make sure you discuss any questions you have with your health care provider. Document Released: 12/22/2009 Document Revised: 09/09/2017 Document Reviewed: 11/01/2016 Elsevier Patient Education  2020 [place].   Chronic Kidney Disease, Adult Chronic kidney disease (CKD) happens when the kidneys are damaged over a long period of time. The kidneys are two organs that help with:  Getting rid of waste and extra fluid from the blood.  Making hormones that  maintain the amount of fluid in your tissues and blood vessels.  Making sure that the body has the right amount of fluids and chemicals. Most of the time, CKD does not go away, but it can usually be controlled. Steps must be taken to slow down the kidney damage or to stop it from getting worse. If this is not done, the kidneys may stop working. Follow these instructions at home: Medicines  Take over-the-counter and prescription medicines only as told by your doctor. You may need to change the amount of medicines you take.  Do not take any new medicines unless your doctor says it is okay. Many medicines can make your kidney damage worse.  Do not take any vitamin and supplements unless your doctor says it is okay. Many vitamins and supplements can make your kidney damage worse. General instructions  Follow a diet as told by your doctor. You may need to stay away from: ? Alcohol. ? Salty foods. ? Foods that are high in:  Potassium.  Calcium.  Protein.  Do not use any products that  contain nicotine or tobacco, such as cigarettes and e-cigarettes. If you need help quitting, ask your doctor.  Keep track of your blood pressure at home. Tell your doctor about any changes.  If you have diabetes, keep track of your blood sugar as told by your doctor.  Try to stay at a healthy weight. If you need help, ask your doctor.  Exercise at least 30 minutes a day, 5 days a week.  Stay up-to-date with your shots (immunizations) as told by your doctor.  Keep all follow-up visits as told by your doctor. This is important. Contact a doctor if:  Your symptoms get worse.  You have new symptoms. Get help right away if:  You have symptoms of end-stage kidney disease. These may include: ? Headaches. ? Numbness in your hands or feet. ? Easy bruising. ? Having hiccups often. ? Chest pain. ? Shortness of breath. ? Stopping of menstrual periods in women.  You have a fever.  You have very little pee (urine).  You have pain or bleeding when you pee. Summary  Chronic kidney disease (CKD) happens when the kidneys are damaged over a long period of time.  Most of the time, this condition does not go away, but it can usually be controlled. Steps must be taken to slow down the kidney damage or to stop it from getting worse.  Treatment may include a combination of medicines and lifestyle changes. This information is not intended to replace advice given to you by your health care provider. Make sure you discuss any questions you have with your health care provider. Document Released: 12/22/2009 Document Revised: 09/09/2017 Document Reviewed: 11/01/2016 Elsevier Patient Education  2020 [employer].  [facility]  Discharge Instructions  AV Fistula or Graft Surgery for Dialysis Access  Please refer to the following instructions for your post-procedure care. Your surgeon or physician assistant will discuss any changes with you.  Activity  You may  drive the day following your surgery, if you are comfortable and no longer taking prescription pain medication. Resume full activity as the soreness in your incision resolves.  Bathing/Showering  You may shower after you go home. Keep your incision dry for 48 hours. Do not soak in a bathtub, hot tub, or swim until the incision heals completely. You may not shower if you have a hemodialysis catheter.  Incision Care  Clean your incision with mild  soap and water after 48 hours. Pat the area dry with a clean towel. You do not need a bandage unless otherwise instructed. Do not apply any ointments or creams to your incision. You may have skin glue on your incision. Do not peel it off. It will come off on its own in about one week. Your arm may swell a bit after surgery. To reduce swelling use pillows to elevate your arm so it is above your heart. Your doctor will tell you if you need to lightly wrap your arm with an ACE bandage.  Diet  Resume your normal diet. There are not special food restrictions following this procedure. In order to heal from your surgery, it is CRITICAL to get adequate nutrition. Your body requires vitamins, minerals, and protein. Vegetables are the best source of vitamins and minerals. Vegetables also provide the perfect balance of protein. Processed food has little nutritional value, so try to avoid this.  Medications  Resume taking all of your medications. If your incision is causing pain, you may take over-the counter pain relievers such as acetaminophen (Tylenol). If you were prescribed a stronger pain medication, please be aware these medications can cause nausea and constipation. Prevent nausea by taking the medication with a snack or meal. Avoid constipation by drinking plenty of fluids and eating foods with high amount of fiber, such as fruits, vegetables, and grains. Do not take Tylenol if you are taking prescription pain medications.     Follow up Your surgeon may  want to see you in the office following your access surgery. If so, this will be arranged at the time of your surgery.  Please call us immediately for any of the following conditions:  Increased pain, redness, drainage (pus) from your incision site Fever of 101 degrees or higher Severe or worsening pain at your incision site Hand pain or numbness.  Reduce your risk of vascular disease:  Stop smoking. If you would like help, call [facility] at 1-800-QUIT-NOW 743-040-5959) or [place] at [place] your cholesterol Maintain a desired weight Control your diabetes Keep your blood pressure down  Dialysis  It will take several weeks to several months for your new dialysis access to be ready for use. Your surgeon will determine when it is OK to use it. Your nephrologist will continue to direct your dialysis. You can continue to use your Permcath until your new access is ready for use.  If you have any questions, please call the office at [phone].

## 2019-08-10 NOTE — Anesthesia Postprocedure Evaluation (Signed)
Anesthesia Post Note  Patient: Samantha Mckenzie  Procedure(s) Performed: ARTERIOVENOUS (AV) FISTULA CREATION VERSUS GRAFT PLACEMENT (Right Arm Upper)     Patient location during evaluation: PACU Anesthesia Type: MAC Level of consciousness: awake and alert Pain management: pain level controlled Vital Signs Assessment: post-procedure vital signs reviewed and stable Respiratory status: spontaneous breathing, nonlabored ventilation, respiratory function stable and patient connected to nasal cannula oxygen Cardiovascular status: stable and blood pressure returned to baseline Postop Assessment: no apparent nausea or vomiting Anesthetic complications: no    Last Vitals:  Vitals:   08/10/19 1026 08/10/19 1250  BP: (!) 151/55 (!) 121/53  Pulse: 71 67  Resp: 14 16  Temp: (!) 36.4 C (!) 36.3 C  SpO2: 100% 100%    Last Pain:  Vitals:   08/10/19 1250  TempSrc: Oral  PainSc:                  Ilah Boule COKER

## 2019-08-10 NOTE — Op Note (Signed)
    NAME: [name]    MRN: [id number] DOB: [birth year]    DATE OF OPERATION: 08/10/2019  PREOP DIAGNOSIS:    End-stage renal disease  POSTOP DIAGNOSIS:    Same  PROCEDURE:    New right upper arm AV graft (4-7 mm PTFE graft)  SURGEON: Judeth Cornfield. Scot Dock, MD  ASSIST: Arlee Muslim, PA  ANESTHESIA: Local with sedation  EBL: Minimal  INDICATIONS:    Samantha Mckenzie is a 83 y.o. female who presents for new access.  She has a history of lymphedema and cellulitis in the left arm and therefore we could only use the right arm which did not have adequate vein for a fistula  FINDINGS:   Excellent thrill at the completion of the procedure with a brisk radial and ulnar signal with the Doppler.  TECHNIQUE:   The patient was taken to the operating room and sedated by anesthesia.  The right upper extremity was prepped and draped in usual sterile fashion.  After the skin was anesthetized with 1% lidocaine, a longitudinal incision was made above the antecubital level and the dissection carried down to the brachial artery which was dissected free and controlled with a vessel loop.  It was about a 3 mm artery.  The adjacent veins were very small.  I therefore elected to place an upper arm graft.  After the skin was anesthetized a longitudinal incision was made beneath the axilla and the dissection carried down to the high brachial vein.  The vein was dissected free and branches were divided between clips and 3-0 silk ties.  After the skin was anesthetized, a 4-7 mm PTFE graft was tunneled in the upper arm and the patient was then heparinized.  The brachial artery was clamped proximally and distally and a longitudinal arteriotomy was made.  A short segment of the 4 mm in the graft was excised, the graft slightly spatulated and sewn end-to-side to the brachial artery using continuous 6-0 Prolene suture.  The graft then pulled the appropriate length for anastomosis to the high brachial vein.   The vein was ligated distally.  It was spatulated proximally.  The graft was cut to the appropriate length spatulated and sewn into into the high brachial vein using 2 continuous 6-0 Prolene sutures.  At the completion there was an excellent thrill in the graft.  There was a brisk radial and ulnar signal with the Doppler.  The heparin was partially reversed with protamine.  The wounds were each closed with a deep layer of 3-0 Vicryl and the skin closed with 4-0 Vicryl.  Dermabond was applied.  The patient tolerated the procedure well and was transferred to the recovery room in stable condition.  All needle and sponge counts were correct.  Deitra Mayo, MD, FACS [facility]  DATE OF DICTATION:   08/10/2019

## 2019-08-10 NOTE — TOC Transition Note (Signed)
Transition of Care [facility]) - CM/SW Discharge Note   Patient Details  Name: Samantha Mckenzie MRN: [id number] Date of Birth: 06-16-1936  Transition of Care [facility]) CM/SW Contact:  Benard Halsted, LCSW Phone Number: 08/10/2019, 3:15 PM   Clinical Narrative:    CSW made [facility] aware of patient's discharge home with spouse. No other needs identified.    Final next level of care: [place] Barriers to Discharge: No Barriers Identified   Patient Goals and CMS Choice Patient states their goals for this hospitalization and ongoing recovery are:: return home CMS Medicare.gov Compare Post Acute Care list provided to:: Patient Choice offered to / list presented to : Patient  Discharge Placement                    Patient and family notified of of transfer: 08/10/19  Discharge Plan and Services   Discharge Planning Services: CM Consult            DME Arranged: N/A DME Agency: NA       HH Arranged: PT, OT [place] Agency: [facility] (formerly [employer]) Date [place]: 08/10/19 Time [place]: [place] Representative spoke with at [place]: [place] ([place]) Interventions     Readmission Risk Interventions Readmission Risk Prevention Plan 08/03/2019 08/03/2019 06/04/2019  Transportation Screening Complete - Complete  PCP or Specialist Appt within 3-5 Days - - -  HRI or [place] for [place] - - -  Medication Review (RN Care Manager) Referral to Pharmacy - Complete  PCP or Specialist appointment within 3-5 days of discharge Complete - Complete  HRI or Home Care Consult Complete - -  SW Recovery Care/Counseling Consult Complete Complete Complete  Palliative Care Screening Complete Complete Not [place] Not Applicable - -  Some recent data might be hidden

## 2019-08-10 NOTE — Progress Notes (Signed)
Patient discharge instructions reviewed. Patient verbalized understanding . IV team called to capped off line for home. All question and concerns answered.Marland Kitchen

## 2019-08-11 ENCOUNTER — Telehealth: Payer: Self-pay | Admitting: Physician Assistant

## 2019-08-11 DIAGNOSIS — D631 Anemia in chronic kidney disease: Secondary | ICD-10-CM | POA: Diagnosis not present

## 2019-08-11 DIAGNOSIS — N2581 Secondary hyperparathyroidism of renal origin: Secondary | ICD-10-CM | POA: Diagnosis not present

## 2019-08-11 DIAGNOSIS — D509 Iron deficiency anemia, unspecified: Secondary | ICD-10-CM | POA: Diagnosis not present

## 2019-08-11 DIAGNOSIS — Z992 Dependence on renal dialysis: Secondary | ICD-10-CM | POA: Diagnosis not present

## 2019-08-11 DIAGNOSIS — E1129 Type 2 diabetes mellitus with other diabetic kidney complication: Secondary | ICD-10-CM | POA: Diagnosis not present

## 2019-08-11 DIAGNOSIS — N186 End stage renal disease: Secondary | ICD-10-CM | POA: Diagnosis not present

## 2019-08-11 NOTE — Telephone Encounter (Signed)
Transition of care contact from inpatient facility  Date of discharge: 08/10/2019 Date of Contact: 08/11/2019 Method of contact: Phone  Attempted to contact patient to discuss transition of care from inpatient admission. Patient did not answer the phone, however I also spoke with her outpatient HD unit who reports she did go to dialysis today and treatment went well. She was seen by Dr. Moshe Cipro. Left VM for patient and told her to call back with any questions.

## 2019-08-12 ENCOUNTER — Telehealth: Payer: Self-pay | Admitting: Physician Assistant

## 2019-08-12 ENCOUNTER — Telehealth: Payer: Self-pay | Admitting: Primary Care

## 2019-08-12 DIAGNOSIS — Z992 Dependence on renal dialysis: Secondary | ICD-10-CM | POA: Diagnosis not present

## 2019-08-12 DIAGNOSIS — E1129 Type 2 diabetes mellitus with other diabetic kidney complication: Secondary | ICD-10-CM | POA: Diagnosis not present

## 2019-08-12 DIAGNOSIS — N186 End stage renal disease: Secondary | ICD-10-CM | POA: Diagnosis not present

## 2019-08-12 NOTE — Telephone Encounter (Signed)
Incoming call with questions RE her medications post hospital stay. She is a patient of the palliative medicine practice. Message sent to staff to call on Monday.

## 2019-08-12 NOTE — Telephone Encounter (Signed)
Transition of care contact from inpatient facility  Date of discharge: 08/10/2019 Date of contact: 08/12/2019  Attempted to contact the patient to discuss transition of care from inpatient admission. Patient did not answer the phone. Message was left on the patient's voicemail with call back number.

## 2019-08-13 ENCOUNTER — Telehealth: Payer: Self-pay | Admitting: Licensed Clinical Social Worker

## 2019-08-13 NOTE — Telephone Encounter (Signed)
Palliative Care SW made a follow-up phone call to patient after her hospitalization.  She stated she was now receiving dialysis.  Her neighbor is an Therapist, sports, and helped with her pill box this week.  She agreed to a visit next week, 11/9, at 12:00.

## 2019-08-14 ENCOUNTER — Encounter (HOSPITAL_COMMUNITY): Payer: Self-pay | Admitting: Vascular Surgery

## 2019-08-14 DIAGNOSIS — Z23 Encounter for immunization: Secondary | ICD-10-CM | POA: Diagnosis not present

## 2019-08-14 DIAGNOSIS — D631 Anemia in chronic kidney disease: Secondary | ICD-10-CM | POA: Diagnosis not present

## 2019-08-14 DIAGNOSIS — N2581 Secondary hyperparathyroidism of renal origin: Secondary | ICD-10-CM | POA: Diagnosis not present

## 2019-08-14 DIAGNOSIS — N186 End stage renal disease: Secondary | ICD-10-CM | POA: Diagnosis not present

## 2019-08-14 DIAGNOSIS — E1129 Type 2 diabetes mellitus with other diabetic kidney complication: Secondary | ICD-10-CM | POA: Diagnosis not present

## 2019-08-14 DIAGNOSIS — D509 Iron deficiency anemia, unspecified: Secondary | ICD-10-CM | POA: Diagnosis not present

## 2019-08-14 DIAGNOSIS — Z992 Dependence on renal dialysis: Secondary | ICD-10-CM | POA: Diagnosis not present

## 2019-08-15 ENCOUNTER — Telehealth: Payer: Self-pay | Admitting: *Deleted

## 2019-08-15 NOTE — Telephone Encounter (Signed)
Called pt to schedule hospital follow up.   Transition Care Management Follow-up Telephone Call   Date discharged? 08/10/19   How have you been since you were released from the hospital? I'm weak.   Do you understand why you were in the hospital? yes   Do you understand the discharge instructions? yes   Where were you discharged to? Home w/ husband   Items Reviewed:  Medications reviewed: "there are a lot of papers. I don't have them right now. I will bring them to Dr.Paz"  Allergies reviewed: yes  Dietary changes reviewed: yes  Referrals reviewed: yes   Functional Questionnaire:   Activities of Daily Living (ADLs):   She states they are independent in the following: ambulation, bathing and hygiene, feeding, continence, grooming, toileting and dressing States they require assistance with the following: n/a   Any transportation issues/concerns?: no   Any patient concerns? no   Confirmed importance and date/time of follow-up visits scheduled yes  Provider Appointment booked with PCP 08/17/19  Confirmed with patient if condition begins to worsen call PCP or go to the ER.  Patient was given the office number and encouraged to call back with question or concerns.  : yes

## 2019-08-16 DIAGNOSIS — N2581 Secondary hyperparathyroidism of renal origin: Secondary | ICD-10-CM | POA: Diagnosis not present

## 2019-08-16 DIAGNOSIS — E1129 Type 2 diabetes mellitus with other diabetic kidney complication: Secondary | ICD-10-CM | POA: Diagnosis not present

## 2019-08-16 DIAGNOSIS — Z992 Dependence on renal dialysis: Secondary | ICD-10-CM | POA: Diagnosis not present

## 2019-08-16 DIAGNOSIS — N186 End stage renal disease: Secondary | ICD-10-CM | POA: Diagnosis not present

## 2019-08-16 DIAGNOSIS — D631 Anemia in chronic kidney disease: Secondary | ICD-10-CM | POA: Diagnosis not present

## 2019-08-16 DIAGNOSIS — D509 Iron deficiency anemia, unspecified: Secondary | ICD-10-CM | POA: Diagnosis not present

## 2019-08-17 ENCOUNTER — Other Ambulatory Visit: Payer: Self-pay

## 2019-08-17 ENCOUNTER — Encounter: Payer: Self-pay | Admitting: Internal Medicine

## 2019-08-17 ENCOUNTER — Ambulatory Visit (INDEPENDENT_AMBULATORY_CARE_PROVIDER_SITE_OTHER): Payer: Medicare Other | Admitting: Internal Medicine

## 2019-08-17 VITALS — BP 109/32 | HR 72 | Temp 96.6°F | Resp 18 | Ht 62.0 in

## 2019-08-17 DIAGNOSIS — N189 Chronic kidney disease, unspecified: Secondary | ICD-10-CM | POA: Diagnosis not present

## 2019-08-17 DIAGNOSIS — D649 Anemia, unspecified: Secondary | ICD-10-CM

## 2019-08-17 DIAGNOSIS — Z872 Personal history of diseases of the skin and subcutaneous tissue: Secondary | ICD-10-CM | POA: Diagnosis not present

## 2019-08-17 DIAGNOSIS — I10 Essential (primary) hypertension: Secondary | ICD-10-CM

## 2019-08-17 DIAGNOSIS — L03114 Cellulitis of left upper limb: Secondary | ICD-10-CM

## 2019-08-17 DIAGNOSIS — R627 Adult failure to thrive: Secondary | ICD-10-CM | POA: Diagnosis not present

## 2019-08-17 NOTE — Patient Instructions (Signed)
   GO TO THE FRONT DESK Schedule your next appointment   For a check up in 4 months

## 2019-08-17 NOTE — Progress Notes (Signed)
Subjective:    Patient ID: Samantha Mckenzie, female    DOB: [birth year], 83 y.o.   MRN: [id number]  DOS:  08/17/2019 Type of visit - description: TCM 7 Admitted to the hospital and discharge 8 days later on 08/10/2019.  Had acute on chronic kidney disease, now on  hemodialysis Left arm cellulitis: Ultrasound negative for DVT , finished antibiotics before discharge Anemia, no active bleeding, FOBT positive, saw GI, no acute intervention needed, anemia is likely multifactorial, had 2 PRBCs. DM was managed with a sliding scale insulin HTN was elevated in the hospital, medication adjusted.  Review of Systems   Since she left the hospital she is feeling weak. Has a hard time standing up but uses a walker and is able to move around her house on limited basis. Her husband is assisting her. She has declined PT at home and  still declines. No fever chills No chest pain Occasionally short of breath Lower extremity edema improved Left arm edema: On and off but no redness. Emotionally doing okay  Past Medical History:  Diagnosis Date  . Allergic rhinitis   . Anemia   . Breast CA ([place])    surgery, chemo, XRT; had peripheral neuropathy (imbalance at times) after chemo  . Cellulitis    Left arm, recurrent   . Cellulitis LEFT arm recurrent 12/08/2011  . Chronic renal disease, stage IV (HCC)    Dr. Moshe Cipro  . Depression   . Fatigue   . GERD (gastroesophageal reflux disease)    gastritis, EGD 02/2007  . Gout   . Hyperlipidemia   . Hypertension   . Macular degeneration 2011  . Normal cardiac stress test 12/2014  . Osteoarthritis   . Renal insufficiency     Past Surgical History:  Procedure Laterality Date  . AV FISTULA PLACEMENT Right 08/10/2019   Procedure: ARTERIOVENOUS (AV) FISTULA CREATION VERSUS GRAFT PLACEMENT;  Surgeon: Angelia Mould, MD;  Location: [facility];  Service: Vascular;  Laterality: Right;  . BIOPSY  06/06/2019   Procedure: BIOPSY;  Surgeon: Ladene Artist, MD;  Location: [facility];  Service: Endoscopy;;  . cataracts bilaterally  1 -2012  . COLONOSCOPY WITH PROPOFOL N/A 06/06/2019   Procedure: COLONOSCOPY WITH PROPOFOL;  Surgeon: Ladene Artist, MD;  Location: [facility];  Service: Endoscopy;  Laterality: N/A;  . IR FLUORO GUIDE CV LINE RIGHT  08/03/2019  . IR US GUIDE VASC ACCESS RIGHT  08/03/2019  . LUNG BIOPSY  1999   neg  . MASTECTOMY Left 1999   and lymphnodes     Social History   Socioeconomic History  . Marital status: Married    Spouse name: Not on file  . Number of children: 2  . Years of education: Not on file  . Highest education level: Not on file  Occupational History  . Occupation: retired     Fish farm manager: RETIRED  Social Needs  . Financial resource strain: Not on file  . Food insecurity    Worry: Not on file    Inability: Not on file  . Transportation needs    Medical: No    Non-medical: No  Tobacco Use  . Smoking status: Never Smoker  . Smokeless tobacco: Never Used  Substance and Sexual Activity  . Alcohol use: Not Currently    Alcohol/week: 0.0 standard drinks    Comment: rarely  . Drug use: No  . Sexual activity: Not Currently  Lifestyle  . Physical activity    Days per week: Not  on file    Minutes per session: Not on file  . Stress: Not on file  Relationships  . Social Herbalist on phone: Not on file    Gets together: Not on file    Attends religious service: Not on file    Active member of club or organization: Not on file    Attends meetings of clubs or organizations: Not on file    Relationship status: Not on file  . Intimate partner violence    Fear of current or ex partner: Not on file    Emotionally abused: Not on file    Physically abused: Not on file    Forced sexual activity: Not on file  Other Topics Concern  . Not on file  Social History Narrative   Lives w/ husband , 2 children in [place], 2 [place] as of 08/17/2019      Reactions   Codeine  Nausea And Vomiting   Morphine Nausea And Vomiting   Oxycodone-acetaminophen Nausea And Vomiting   Sulfonamide Derivatives Nausea And Vomiting   Tramadol Hcl Nausea And Vomiting      Medication List       Accurate as of August 17, 2019 11:59 PM. If you have any questions, ask your nurse or doctor.        acetaminophen 325 MG tablet Commonly known as: TYLENOL Take 2 tablets (650 mg total) by mouth every 6 (six) hours as needed for mild pain, fever or headache (or Fever >/= 101).   allopurinol 100 MG tablet Commonly known as: ZYLOPRIM Take 1 tablet (100 mg total) by mouth daily.   amLODipine 10 MG tablet Commonly known as: NORVASC Take 1 tablet (10 mg total) by mouth daily.   atorvastatin 20 MG tablet Commonly known as: LIPITOR Take 1 tablet (20 mg total) by mouth daily.   CALTRATE 600+D3 PO Take 1 tablet by mouth 2 (two) times daily.   carvedilol 6.25 MG tablet Commonly known as: COREG Take 1 tablet (6.25 mg total) by mouth 2 (two) times daily with a meal.   CENTRUM SILVER PO Take 1 tablet by mouth daily.   Darbepoetin Alfa 100 MCG/0.5ML Sosy injection Commonly known as: ARANESP Inject 0.5 mLs (100 mcg total) into the vein every Saturday with hemodialysis.   dicyclomine 10 MG capsule Commonly known as: BENTYL Take 2 capsules (20 mg total) by mouth 4 (four) times daily -  before meals and at bedtime.   dronabinol 2.5 MG capsule Commonly known as: Marinol Take 1 capsule (2.5 mg total) by mouth 2 (two) times daily before a meal.   escitalopram 10 MG tablet Commonly known as: Lexapro Take 2 tablets (20 mg total) by mouth daily.   hydrALAZINE 10 MG tablet Commonly known as: APRESOLINE Take 1 tablet (10 mg total) by mouth every 8 (eight) hours.   loperamide 2 MG tablet Commonly known as: IMODIUM A-D Take 2 mg by mouth 4 (four) times daily as needed for diarrhea or loose stools.   ondansetron 4 MG tablet Commonly known as: ZOFRAN Take 1 tablet (4 mg total)  by mouth 4 (four) times daily.   pantoprazole 40 MG tablet Commonly known as: PROTONIX Take 1 tablet (40 mg total) by mouth daily before breakfast.   predniSONE 10 MG tablet Commonly known as: DELTASONE Take 1 tablets a day (69m) for one month; then take 1.5 tablets a day (365m for 2  weeks; then take 1 tablet a day (30m) until further notice   predniSONE 20 MG tablet Commonly known as: Deltasone Prednisone 40 mg daily during the month of October 2.  Prednisone 30 mg daily from November 1 through November 15 3.  Prednisone 20 mg daily from November 16 until further notice   vitamin B-12 1000 MCG tablet Commonly known as: CYANOCOBALAMIN Take 1,000 mcg by mouth daily.   vitamin C 500 MG tablet Commonly known as: ASCORBIC ACID Take 500 mg by mouth daily.   Vitamin D3 25 MCG (1000 UT) Caps Take 1 capsule by mouth daily.           Objective:   Physical Exam BP (!) 109/32 (BP Location: Right Arm, Patient Position: Sitting, Cuff Size: Small)   Pulse 72   Temp (!) 96.6 F (35.9 C) (Temporal)   Resp 18   Ht 5' 2"  (1.575 m)   BMI 47.58 kg/m  General:   Well developed, NAD, BMI noted. HEENT:  Normocephalic . Face symmetric, atraumatic Lungs:  CTA B Normal respiratory effort, no intercostal retractions, no accessory muscle use. Heart: RRR,  no murmur.  +/+++ At the distal pretibial area and dorsum of the feet. Skin: + pale. Not jaundice Left arm: No red, not warm. Measured: 11-1/4 inches in diameter.  Better than before. Neurologic:  alert & oriented X3.  Speech normal, gait tested, sitting in a wheelchair. Psych--  Cognition and judgment appear intact.  Cooperative with normal attention span and concentration.  Behavior appropriate. No anxious or depressed appearing.      Assessment       Assessment  DM- neuropathy (paresthesias, nl pinprick exam), CKD HTN Hyperlipidemia: Lipitor intolerant? CAD? States see Dr HTerrence Dupont no OV  records. 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   Cellulitis, left arm: Resolved, see physical exam, no redness, swelling it back to baseline.  Encouraged to continue keeping arm elevated HTN: BP was elevated at times during the hospital stay, BP today is in the low side.  For now continue the same medications iIncluding amlodipine and the 2 new drugs carvedilol and hydralazine.  Recommend not to change BP meds without consulting nephrology. Anxiety depression: On Lexapro, seems to be controlled. General debility: She is concerned about "not feeling well", advised patient that is likely multifactorial including deconditioning, anemia, kidney failure.  Again she declines home PT.  Will call me if she changes her mind. RTC 4 months

## 2019-08-17 NOTE — Progress Notes (Signed)
Pre visit review using our clinic review tool, if applicable. No additional management support is needed unless otherwise documented below in the visit note. 

## 2019-08-18 DIAGNOSIS — E1129 Type 2 diabetes mellitus with other diabetic kidney complication: Secondary | ICD-10-CM | POA: Diagnosis not present

## 2019-08-18 DIAGNOSIS — N2581 Secondary hyperparathyroidism of renal origin: Secondary | ICD-10-CM | POA: Diagnosis not present

## 2019-08-18 DIAGNOSIS — D631 Anemia in chronic kidney disease: Secondary | ICD-10-CM | POA: Diagnosis not present

## 2019-08-18 DIAGNOSIS — D509 Iron deficiency anemia, unspecified: Secondary | ICD-10-CM | POA: Diagnosis not present

## 2019-08-18 DIAGNOSIS — Z992 Dependence on renal dialysis: Secondary | ICD-10-CM | POA: Diagnosis not present

## 2019-08-18 DIAGNOSIS — N186 End stage renal disease: Secondary | ICD-10-CM | POA: Diagnosis not present

## 2019-08-18 NOTE — Assessment & Plan Note (Signed)
Since the last visit was admitted to the hospital and found to be on acute on chronic kidney failure Kidney failure: Now on hemodialysis Tuesday Thursdays and Saturdays.   Cellulitis, left arm: Resolved, see physical exam, no redness, swelling it back to baseline.  Encouraged to continue keeping arm elevated HTN: BP was elevated at times during the hospital stay, BP today is in the low side.  For now continue the same medications iIncluding amlodipine and the 2 new drugs carvedilol and hydralazine.  Recommend not to change BP meds without consulting nephrology. Anxiety depression: On Lexapro, seems to be controlled. General debility: She is concerned about "not feeling well", advised patient that is likely multifactorial including deconditioning, anemia, kidney failure.  Again she declines home PT.  Will call me if she changes her mind. RTC 4 months

## 2019-08-20 ENCOUNTER — Other Ambulatory Visit: Payer: Medicare Other | Admitting: Licensed Clinical Social Worker

## 2019-08-20 ENCOUNTER — Other Ambulatory Visit: Payer: Self-pay

## 2019-08-20 ENCOUNTER — Encounter (HOSPITAL_COMMUNITY): Payer: Medicare Other

## 2019-08-20 ENCOUNTER — Other Ambulatory Visit: Payer: Medicare Other | Admitting: *Deleted

## 2019-08-20 DIAGNOSIS — Z515 Encounter for palliative care: Secondary | ICD-10-CM

## 2019-08-20 NOTE — Progress Notes (Signed)
COMMUNITY PALLIATIVE CARE SW NOTE  PATIENT NAME: Samantha Mckenzie DOB: 28-Jan-1936 MRN: [id number]  PRIMARY CARE PROVIDER: Colon Branch, MD  RESPONSIBLE PARTY:  Acct ID - Guarantor Home Phone Work Phone Relationship Acct Type  [id number] FRONIE, [phone]  Self P/F     [address], [place], [place] [address]     PLAN OF CARE and INTERVENTIONS:             1. GOALS OF CARE/ ADVANCE CARE PLANNING:  Patient wishes to remain in her home with her husband, Samantha Mckenzie.  Patient has a MOST form and DNR. 2. SOCIAL/EMOTIONAL/SPIRITUAL ASSESSMENT/ INTERVENTIONS:  SW and Palliative Care RN, Samantha Mckenzie, met with patient and her husband in their home.  Samantha Mckenzie remained in his office during most of the visit.  Patient had been lying on the couch in the living room and complained of fatigue.  She stated she does not feel any different since dialysis started.  Patient had difficulty identifying any activity that brings her joy.  She focuses on activities she can no longer do.  She said she used to love to listen to music and play on her tablet.  SW provided active listening and supportive counseling. 3. PATIENT/CAREGIVER EDUCATION/ COPING:  Patient copes by relying on her faith. 4. PERSONAL EMERGENCY PLAN:  Family will call EMS. 5. COMMUNITY RESOURCES COORDINATION/ HEALTH CARE NAVIGATION:  None. 6. FINANCIAL/LEGAL CONCERNS/INTERVENTIONS:  None.     SOCIAL HX:  Social History   Tobacco Use  . Smoking status: Never Smoker  . Smokeless tobacco: Never Used  Substance Use Topics  . Alcohol use: Not Currently    Alcohol/week: 0.0 standard drinks    Comment: rarely    CODE STATUS:  DNR ADVANCED DIRECTIVES: N MOST FORM COMPLETE: Y HOSPICE EDUCATION PROVIDED: N PPS:  Patient stated her appetite is good.  Patient ambulates in her home by holding on to furniture and walls.  She uses a walker outside of her home. Duration of visit and documentation:  75 minutes.      Samantha Corn Chawn Spraggins, LCSW

## 2019-08-20 NOTE — Progress Notes (Signed)
COMMUNITY PALLIATIVE CARE RN NOTE  PATIENT NAME: Samantha Mckenzie DOB: 1935/10/31 MRN: [id number]  PRIMARY CARE PROVIDER: Colon Branch, MD  RESPONSIBLE PARTY:  Acct ID - Guarantor Home Phone Work Phone Relationship Acct Type  [id number] CHELSIA, [phone]  Self P/F     [address], [place], [place] [address]   Covid-19 Pre-screening Negative  PLAN OF CARE and INTERVENTION:  1. ADVANCE CARE PLANNING/GOALS OF CARE: Goal is for patient to remain at home for as long as possible.  2. PATIENT/CAREGIVER EDUCATION: Medication Management, Safe Mobility 3. DISEASE STATUS: Joint visit made with Palliative care SW, Lynn Duffy. Patient is alert and oriented during visit and able to engage in appropriate conversation, but is forgetful. She denies pain at this time. No dyspnea noted. Her main complaint is not having much energy and having to take frequent rest periods between tasks. She spoke of her recent hospitalization. She reports needing blood transfusions during this time for her low Hgb. She is now on dialysis 3x/week on Tues/Thurs/Sat from 11a-4p. She now has a fistula in her right upper arm. She continues with some swelling in her left arm, but it is no longer red, and is less swollen than noted 2 weeks ago. She does have some edema in her lower extremities. She reports having 2 falls over this past week without injury. Her husband was able to help her up. She denies feeling dizzy, but states that her legs become weak and unsteady. She ambulates using her walker in the home most of the time, especially on weaker days. Her intake is normal. She denies nausea/vomiting. Occasional diarrhea and Imodium remains effective. Her son has filled her pill box for the next week, but I will resume this task for her on Monday. Reviewed medications and noted recent changes. Metoprolol has been discontinued and she is now on Coreg BID and Hydralazine TID. Will continue to monitor.   HISTORY OF PRESENT  ILLNESS:  This is a 83 yo female who resides at home with her husband. Palliative Care team continues to follow patient. Next visit scheduled in 1 week.  CODE STATUS: DNR ADVANCED DIRECTIVES: Y MOST FORM: yes PPS: 50%   PHYSICAL EXAM:   LUNGS: clear to auscultation  CARDIAC: Cor RRR EXTREMITIES: Swelling to left arm, and bilateral lower extremities SKIN: Exposed skin is dry and intact, right upper arm fistula WNL  NEURO: Alert and oriented x 3, forgetful, generalized weakness, ambulatory   (Duration of visit and documentation 60 minutes)    Daryl Eastern, RN, BSN

## 2019-08-21 DIAGNOSIS — N186 End stage renal disease: Secondary | ICD-10-CM | POA: Diagnosis not present

## 2019-08-21 DIAGNOSIS — N2581 Secondary hyperparathyroidism of renal origin: Secondary | ICD-10-CM | POA: Diagnosis not present

## 2019-08-21 DIAGNOSIS — D509 Iron deficiency anemia, unspecified: Secondary | ICD-10-CM | POA: Diagnosis not present

## 2019-08-21 DIAGNOSIS — E1129 Type 2 diabetes mellitus with other diabetic kidney complication: Secondary | ICD-10-CM | POA: Diagnosis not present

## 2019-08-21 DIAGNOSIS — Z992 Dependence on renal dialysis: Secondary | ICD-10-CM | POA: Diagnosis not present

## 2019-08-21 DIAGNOSIS — D631 Anemia in chronic kidney disease: Secondary | ICD-10-CM | POA: Diagnosis not present

## 2019-08-23 DIAGNOSIS — D631 Anemia in chronic kidney disease: Secondary | ICD-10-CM | POA: Diagnosis not present

## 2019-08-23 DIAGNOSIS — E1129 Type 2 diabetes mellitus with other diabetic kidney complication: Secondary | ICD-10-CM | POA: Diagnosis not present

## 2019-08-23 DIAGNOSIS — Z992 Dependence on renal dialysis: Secondary | ICD-10-CM | POA: Diagnosis not present

## 2019-08-23 DIAGNOSIS — N186 End stage renal disease: Secondary | ICD-10-CM | POA: Diagnosis not present

## 2019-08-23 DIAGNOSIS — D509 Iron deficiency anemia, unspecified: Secondary | ICD-10-CM | POA: Diagnosis not present

## 2019-08-23 DIAGNOSIS — N2581 Secondary hyperparathyroidism of renal origin: Secondary | ICD-10-CM | POA: Diagnosis not present

## 2019-08-25 DIAGNOSIS — N186 End stage renal disease: Secondary | ICD-10-CM | POA: Diagnosis not present

## 2019-08-25 DIAGNOSIS — Z992 Dependence on renal dialysis: Secondary | ICD-10-CM | POA: Diagnosis not present

## 2019-08-25 DIAGNOSIS — N2581 Secondary hyperparathyroidism of renal origin: Secondary | ICD-10-CM | POA: Diagnosis not present

## 2019-08-25 DIAGNOSIS — E1129 Type 2 diabetes mellitus with other diabetic kidney complication: Secondary | ICD-10-CM | POA: Diagnosis not present

## 2019-08-25 DIAGNOSIS — D509 Iron deficiency anemia, unspecified: Secondary | ICD-10-CM | POA: Diagnosis not present

## 2019-08-25 DIAGNOSIS — D631 Anemia in chronic kidney disease: Secondary | ICD-10-CM | POA: Diagnosis not present

## 2019-08-27 ENCOUNTER — Other Ambulatory Visit: Payer: Medicare Other | Admitting: *Deleted

## 2019-08-27 ENCOUNTER — Telehealth: Payer: Self-pay | Admitting: *Deleted

## 2019-08-27 ENCOUNTER — Other Ambulatory Visit: Payer: Self-pay

## 2019-08-27 DIAGNOSIS — Z515 Encounter for palliative care: Secondary | ICD-10-CM

## 2019-08-27 LAB — SURGICAL PATHOLOGY

## 2019-08-27 NOTE — Progress Notes (Signed)
COMMUNITY PALLIATIVE CARE RN NTOE  PATIENT NAME: Samantha Mckenzie DOB: 02-24-1936 MRN: [id number]  PRIMARY CARE PROVIDER: Colon Branch, MD  RESPONSIBLE PARTY:  Acct ID - Guarantor Home Phone Work Phone Relationship Acct Type  [id number] Samantha Mckenzie, [phone]  Self P/F     [address], [place], [place] [address]   Palliative care SW and myself went out to visit with patient, as previously scheduled, today at 11:30a. Upon arrival, husband reports that patient went to dialysis today. Last week patient stated that her dialysis days are Tues, Thurs, Sat. Will contact patient this evening after dialysis to reschedule visit so I can fill her pill box.  Daryl Eastern, RN BSN

## 2019-08-27 NOTE — Telephone Encounter (Signed)
Went to visit patient earlier today to fill her pill box, but husband said he took her to dialysis. Called patient when she returned home and she states that she did not receive dialysis today. Her usual days are Tues, Thurs, Sat. She is supposed to go to dialysis next Monday, since it is Thanksgiving, but got her days mixed up and went today. She is to receive her regular dialysis tomorrow. Visit scheduled for 10 am prior to dialysis so I can fill her pill box.

## 2019-08-28 ENCOUNTER — Other Ambulatory Visit: Payer: Self-pay

## 2019-08-28 ENCOUNTER — Other Ambulatory Visit: Payer: Medicare Other | Admitting: *Deleted

## 2019-08-28 DIAGNOSIS — Z992 Dependence on renal dialysis: Secondary | ICD-10-CM | POA: Diagnosis not present

## 2019-08-28 DIAGNOSIS — D509 Iron deficiency anemia, unspecified: Secondary | ICD-10-CM | POA: Diagnosis not present

## 2019-08-28 DIAGNOSIS — N2581 Secondary hyperparathyroidism of renal origin: Secondary | ICD-10-CM | POA: Diagnosis not present

## 2019-08-28 DIAGNOSIS — E1129 Type 2 diabetes mellitus with other diabetic kidney complication: Secondary | ICD-10-CM | POA: Diagnosis not present

## 2019-08-28 DIAGNOSIS — Z515 Encounter for palliative care: Secondary | ICD-10-CM

## 2019-08-28 DIAGNOSIS — D631 Anemia in chronic kidney disease: Secondary | ICD-10-CM | POA: Diagnosis not present

## 2019-08-28 DIAGNOSIS — N186 End stage renal disease: Secondary | ICD-10-CM | POA: Diagnosis not present

## 2019-08-29 ENCOUNTER — Other Ambulatory Visit: Payer: Self-pay | Admitting: Internal Medicine

## 2019-08-29 ENCOUNTER — Ambulatory Visit: Payer: Medicare Other

## 2019-08-29 NOTE — Progress Notes (Deleted)
POST OPERATIVE OFFICE NOTE    CC:  F/u for surgery  HPI:  This is a 83 y.o. female who is s/p right upper arm AV graft.  She has a history of lymphedema and cellulitis in the left arm and therefore we could only use the right arm which did not have adequate vein for a fistula  Allergies  Allergen Reactions  . Codeine Nausea And Vomiting  . Morphine Nausea And Vomiting  . Oxycodone-Acetaminophen Nausea And Vomiting  . Sulfonamide Derivatives Nausea And Vomiting  . Tramadol Hcl Nausea And Vomiting    Current Outpatient Medications  Medication Sig Dispense Refill  . acetaminophen (TYLENOL) 325 MG tablet Take 2 tablets (650 mg total) by mouth every 6 (six) hours as needed for mild pain, fever or headache (or Fever >/= 101). 15 tablet 0  . allopurinol (ZYLOPRIM) 100 MG tablet Take 1 tablet (100 mg total) by mouth daily. 30 tablet 0  . amLODipine (NORVASC) 10 MG tablet Take 1 tablet (10 mg total) by mouth daily. 30 tablet 1  . atorvastatin (LIPITOR) 20 MG tablet Take 1 tablet (20 mg total) by mouth daily. 30 tablet 0  . Calcium Carb-Cholecalciferol (CALTRATE 600+D3 PO) Take 1 tablet by mouth 2 (two) times daily.     . carvedilol (COREG) 6.25 MG tablet Take 1 tablet (6.25 mg total) by mouth 2 (two) times daily with a meal. 60 tablet 0  . Cholecalciferol (VITAMIN D3) 1000 UNITS CAPS Take 1 capsule by mouth daily.     . Darbepoetin Alfa (ARANESP) 100 MCG/0.5ML SOSY injection Inject 0.5 mLs (100 mcg total) into the vein every Saturday with hemodialysis. 4.2 mL   . dicyclomine (BENTYL) 10 MG capsule Take 2 capsules (20 mg total) by mouth 4 (four) times daily -  before meals and at bedtime. 240 capsule 0  . dronabinol (MARINOL) 2.5 MG capsule Take 1 capsule (2.5 mg total) by mouth 2 (two) times daily before a meal. 60 capsule 0  . escitalopram (LEXAPRO) 10 MG tablet Take 2 tablets (20 mg total) by mouth daily. 180 tablet 0  . hydrALAZINE (APRESOLINE) 10 MG tablet Take 1 tablet (10 mg total) by  mouth every 8 (eight) hours. 90 tablet 0  . loperamide (IMODIUM A-D) 2 MG tablet Take 2 mg by mouth 4 (four) times daily as needed for diarrhea or loose stools.    . Multiple Vitamins-Minerals (CENTRUM SILVER PO) Take 1 tablet by mouth daily.     . ondansetron (ZOFRAN) 4 MG tablet Take 1 tablet (4 mg total) by mouth 4 (four) times daily. 60 tablet 0  . pantoprazole (PROTONIX) 40 MG tablet Take 1 tablet (40 mg total) by mouth daily before breakfast. 30 tablet 1  . predniSONE (DELTASONE) 10 MG tablet Take 1 tablets a day (65m) for one month; then take 1.5 tablets a day (349m for 2 weeks; then take 1 tablet a day (2016muntil further notice 60 tablet 1  . predniSONE (DELTASONE) 20 MG tablet Prednisone 40 mg daily during the month of October 2.  Prednisone 30 mg daily from November 1 through November 15 3.  Prednisone 20 mg daily from November 16 until further notice 60 tablet 3  . vitamin B-12 (CYANOCOBALAMIN) 1000 MCG tablet Take 1,000 mcg by mouth daily.      . vitamin C (ASCORBIC ACID) 500 MG tablet Take 500 mg by mouth daily.     No current facility-administered medications for this visit.      ROS:  See  HPI  Physical Exam:  ***  Incision:  *** Extremities:  *** Neuro: *** Abdomen:  ***  Assessment/Plan:  This is a 83 y.o. female who is s/p: ***  -***   Leontine Locket, PA-C [facility] [phone]  Clinic MD:  ***

## 2019-08-30 ENCOUNTER — Encounter: Payer: Self-pay | Admitting: Family

## 2019-08-30 DIAGNOSIS — D631 Anemia in chronic kidney disease: Secondary | ICD-10-CM | POA: Diagnosis not present

## 2019-08-30 DIAGNOSIS — Z992 Dependence on renal dialysis: Secondary | ICD-10-CM | POA: Diagnosis not present

## 2019-08-30 DIAGNOSIS — N186 End stage renal disease: Secondary | ICD-10-CM | POA: Diagnosis not present

## 2019-08-30 DIAGNOSIS — N2581 Secondary hyperparathyroidism of renal origin: Secondary | ICD-10-CM | POA: Diagnosis not present

## 2019-08-30 DIAGNOSIS — E1129 Type 2 diabetes mellitus with other diabetic kidney complication: Secondary | ICD-10-CM | POA: Diagnosis not present

## 2019-08-30 DIAGNOSIS — D509 Iron deficiency anemia, unspecified: Secondary | ICD-10-CM | POA: Diagnosis not present

## 2019-08-30 NOTE — Progress Notes (Signed)
COMMUNITY PALLIATIVE CARE RN NOTE  PATIENT NAME: Samantha Mckenzie DOB: 10-17-1935 MRN: [id number]  PRIMARY CARE PROVIDER: Colon Branch, MD  RESPONSIBLE PARTY:  Acct ID - Guarantor Home Phone Work Phone Relationship Acct Type  [id number] TERRIE, [phone]  Self P/F     [address], [place], [place] [address]   Covid-19 Pre-screening Negative  PLAN OF CARE and INTERVENTION:  1. ADVANCE CARE PLANNING/GOALS OF CARE: Goal is for patient to remain at home with her husband. She has a DNR. 2. PATIENT/CAREGIVER EDUCATION: Medication Management 3. DISEASE STATUS: RN visit made to fill patient's pill box for the week. There are new orders to discontinue Amlodipine and Hydralazine. Upon arrival, patient answered the door and is using her walker. She says that she feels dizzy along with ongoing fatigue. Her skin is slightly pale in appearance. She remains able to perform ADLs independently, but requires frequent rest periods. She denies pain at this time. She has a dialysis appointment at 11:00 am today. Next week, since it is Thanksgiving, she will have dialysis on Mon, Wed, Fri. She continues with left arm edema from her shoulder down to her hand. She tries to keep it elevated on pillows. Her appetite is fair. No dysphagia. She denies nausea/vomiting. She just wishes that she had more energy. Will continue to monitor.   HISTORY OF PRESENT ILLNESS:  This is a 83 yo female who resides at home with her husband. Palliative care team continues to follow patient. Next visit scheduled on 09/11/19.  CODE STATUS: DNR  ADVANCED DIRECTIVES: Y MOST FORM: yes PPS: 50%   (Duration of visit and documentation 30 minutes)   Daryl Eastern, RN BSN

## 2019-09-01 DIAGNOSIS — D509 Iron deficiency anemia, unspecified: Secondary | ICD-10-CM | POA: Diagnosis not present

## 2019-09-01 DIAGNOSIS — D631 Anemia in chronic kidney disease: Secondary | ICD-10-CM | POA: Diagnosis not present

## 2019-09-01 DIAGNOSIS — N186 End stage renal disease: Secondary | ICD-10-CM | POA: Diagnosis not present

## 2019-09-01 DIAGNOSIS — Z992 Dependence on renal dialysis: Secondary | ICD-10-CM | POA: Diagnosis not present

## 2019-09-01 DIAGNOSIS — E1129 Type 2 diabetes mellitus with other diabetic kidney complication: Secondary | ICD-10-CM | POA: Diagnosis not present

## 2019-09-01 DIAGNOSIS — N2581 Secondary hyperparathyroidism of renal origin: Secondary | ICD-10-CM | POA: Diagnosis not present

## 2019-09-03 DIAGNOSIS — N186 End stage renal disease: Secondary | ICD-10-CM | POA: Diagnosis not present

## 2019-09-03 DIAGNOSIS — D509 Iron deficiency anemia, unspecified: Secondary | ICD-10-CM | POA: Diagnosis not present

## 2019-09-03 DIAGNOSIS — Z992 Dependence on renal dialysis: Secondary | ICD-10-CM | POA: Diagnosis not present

## 2019-09-03 DIAGNOSIS — N2581 Secondary hyperparathyroidism of renal origin: Secondary | ICD-10-CM | POA: Diagnosis not present

## 2019-09-03 DIAGNOSIS — E1129 Type 2 diabetes mellitus with other diabetic kidney complication: Secondary | ICD-10-CM | POA: Diagnosis not present

## 2019-09-03 DIAGNOSIS — D631 Anemia in chronic kidney disease: Secondary | ICD-10-CM | POA: Diagnosis not present

## 2019-09-04 ENCOUNTER — Other Ambulatory Visit: Payer: Self-pay

## 2019-09-04 ENCOUNTER — Encounter (HOSPITAL_COMMUNITY): Payer: Self-pay

## 2019-09-04 ENCOUNTER — Other Ambulatory Visit: Payer: Medicare Other | Admitting: *Deleted

## 2019-09-04 DIAGNOSIS — Z515 Encounter for palliative care: Secondary | ICD-10-CM

## 2019-09-05 DIAGNOSIS — Z992 Dependence on renal dialysis: Secondary | ICD-10-CM | POA: Diagnosis not present

## 2019-09-05 DIAGNOSIS — N186 End stage renal disease: Secondary | ICD-10-CM | POA: Diagnosis not present

## 2019-09-05 DIAGNOSIS — D631 Anemia in chronic kidney disease: Secondary | ICD-10-CM | POA: Diagnosis not present

## 2019-09-05 DIAGNOSIS — D509 Iron deficiency anemia, unspecified: Secondary | ICD-10-CM | POA: Diagnosis not present

## 2019-09-05 DIAGNOSIS — N2581 Secondary hyperparathyroidism of renal origin: Secondary | ICD-10-CM | POA: Diagnosis not present

## 2019-09-05 DIAGNOSIS — E1129 Type 2 diabetes mellitus with other diabetic kidney complication: Secondary | ICD-10-CM | POA: Diagnosis not present

## 2019-09-08 DIAGNOSIS — Z992 Dependence on renal dialysis: Secondary | ICD-10-CM | POA: Diagnosis not present

## 2019-09-08 DIAGNOSIS — N2581 Secondary hyperparathyroidism of renal origin: Secondary | ICD-10-CM | POA: Diagnosis not present

## 2019-09-08 DIAGNOSIS — N186 End stage renal disease: Secondary | ICD-10-CM | POA: Diagnosis not present

## 2019-09-08 DIAGNOSIS — D631 Anemia in chronic kidney disease: Secondary | ICD-10-CM | POA: Diagnosis not present

## 2019-09-08 DIAGNOSIS — E1129 Type 2 diabetes mellitus with other diabetic kidney complication: Secondary | ICD-10-CM | POA: Diagnosis not present

## 2019-09-08 DIAGNOSIS — D509 Iron deficiency anemia, unspecified: Secondary | ICD-10-CM | POA: Diagnosis not present

## 2019-09-10 ENCOUNTER — Other Ambulatory Visit: Payer: Medicare Other | Admitting: *Deleted

## 2019-09-10 ENCOUNTER — Other Ambulatory Visit: Payer: Self-pay

## 2019-09-10 ENCOUNTER — Telehealth: Payer: Self-pay | Admitting: Internal Medicine

## 2019-09-10 DIAGNOSIS — Z515 Encounter for palliative care: Secondary | ICD-10-CM

## 2019-09-10 MED ORDER — DRONABINOL 2.5 MG PO CAPS: 2.5000 mg | ORAL_CAPSULE | Freq: Two times a day (BID) | ORAL | 1 refills | Status: DC

## 2019-09-10 MED ORDER — ONDANSETRON HCL 4 MG PO TABS: 4.0000 mg | ORAL_TABLET | Freq: Four times a day (QID) | ORAL | 0 refills | Status: DC

## 2019-09-10 MED ORDER — CARVEDILOL 6.25 MG PO TABS: 6.2500 mg | ORAL_TABLET | Freq: Two times a day (BID) | ORAL | 6 refills | Status: AC

## 2019-09-10 NOTE — Telephone Encounter (Signed)
Rxs sent

## 2019-09-10 NOTE — Telephone Encounter (Signed)
-----   Message from Conan Bowens, RN sent at 09/10/2019  2:24 PM EST ----- Regarding: Medication Refills Needed Good afternoon,  I am here filling patient's pill box. Here is a list of refills needed that requires MD authorization:  1. Ondansetron 4 mg po 4 times daily for nausea (She only takes 2 times per day) 2. Dronabinol 2.5 mg po twice daily 3. Carvedilol 6.25 mg po twice daily (this medication was prescribed by an internal medicine MD Cherylann Ratel, after her most recent hospitalization so pharmacy will more than likely not be able to get this filled by reaching out to him.)  The other medications have refills and I will call them in to her pharmacy.  Thanks so much, Audie Box, BSN (Palliative Care) [phone] cell

## 2019-09-10 NOTE — Progress Notes (Signed)
COMMUNITY PALLIATIVE CARE RN NOTE  PATIENT NAME: Samantha Mckenzie DOB: September 21, 1936 MRN: [id number]  PRIMARY CARE PROVIDER: Colon Branch, MD  RESPONSIBLE PARTY:  Acct ID - Guarantor Home Phone Work Phone Relationship Acct Type  [id number] Samantha Mckenzie, Samantha [phone]  Self P/F     [address], [place], [place] [address]   Covid-19 Pre-screening Negative  PLAN OF CARE and INTERVENTION:  1. ADVANCE CARE PLANNING/GOALS OF CARE: Goal is to remain at home with her husband. She has a DNR. 2. PATIENT/CAREGIVER EDUCATION: Medication Management, Symptom Management 3. DISEASE STATUS: RN visit made to fill patient's pill box. Upon arrival, patient's husband answered the door. He states that she has not been feeling well this morning and was having another "spell." I went to patient's bedroom to check on her and she was asleep. Easily aroused with verbal stimulation. She denies pain. She says that she is feeling very weak. She is also dizzy with standing and ambulation. Requiring the use of her walker more regularly. She used to mainly walk around the house holding onto walls, furniture, etc. She became frustrated and tearful this am while having difficulty getting dressed. She said she felt like throwing herself onto the floor, but her husband was able to assist her to bed and she was able to take a nap. She states that she is "ready to die and wishes God will take her." She does not have any plans to harm herself. She continues on Lexapro for mood/depression. She says her intake has been ok. Denies nausea. Her mood was more pleasant towards end of visit. She has her compression sleeve on her left arm. Swelling less today, especially in her left hand. Filled pill box. No new medications ordered since last visit. Will continue to monitor.   HISTORY OF PRESENT ILLNESS:  This is a 83 yo female who resides at home with her husband. Palliative Care team continues to follow patient. Next visit scheduled in 1  week.   CODE STATUS: DNR ADVANCED DIRECTIVES: Y MOST FORM: yes PPS: 50%   PHYSICAL EXAM:   LUNGS: clear to auscultation  CARDIAC: Cor RRR EXTREMITIES: Left arm/hand edema, Compression sleeve on SKIN: Exposed skin is dry and intact  NEURO: Alert and oriented x 3, depressed mood, forgetful, ambulatory with walker   (Duration of visit and documentation 75 minutes)   Daryl Eastern, RN, BSN

## 2019-09-10 NOTE — Progress Notes (Signed)
COMMUNITY PALLIATIVE CARE RN NOTE  PATIENT NAME: Samantha Mckenzie DOB: 12/27/1935 MRN: [id number]  PRIMARY CARE PROVIDER: Colon Branch, MD  RESPONSIBLE PARTY:  Acct ID - Guarantor Home Phone Work Phone Relationship Acct Type  [id number] GRAYSON, [phone]  Self P/F     [address], [place], [place] [address]   Covid-19 Pre-screening Negative  PLAN OF CARE and INTERVENTION:  1. ADVANCE CARE PLANNING/GOALS OF CARE: Patient wants to feel stronger and have more energy. She has a DNR. 2. PATIENT/CAREGIVER EDUCATION: Symptom/Medication Management 3. DISEASE STATUS: Met with patient and her husband in their home. Upon arrival, patient's husband answered the door. He states that they just finished eating and that patient was lying in bed asleep. Filled patient's pill box and called in prescriptions with refills (Protonix and Furosemide) to patient's pharmacy. Communication sent to Dr. Larose Kells requesting refill authorization for Coreg, Zofran and Marinol. Family to pick up Vitamin D3 and Multivitamin. I visited with patient in her bedroom. She is awake, lying in bed. She denies pain. Continued weakness and fatigue. Reports difficulty getting dressed at times and husband helps her when needed. She is ambulatory using her walker. She says that she does try to ambulate around the house for exercise. She spends the majority of her day lying in bed. She speaks of times that she required blood transfusions and wonders if her Hgb is low again. She states having difficulty standing up from her dining room chair or couch, which is one of the reasons that she lies in bed. It is easier for her to get up and down from the bed. Her husband now has taken on the responsibility of household chores as she is now unable d/t fatigue. She denies dizziness. Her intake is deteriorating. She is only eating about 1 meal/day. She denies nausea/vomiting but does say that she cannot taste her food or has an appetite, so is  basically forcing herself to eat. She continues on dialysis 3 days/week, Tues, Thurs, Sat. Will continue to monitor.  HISTORY OF PRESENT ILLNESS:  This is a 83 yo female who resides at home with her husband. Palliative care team continues to follow patient. Next visit scheduled in 1 week.    CODE STATUS: DNR  ADVANCED DIRECTIVES: Y MOST FORM: yes PPS: 50%   PHYSICAL EXAM:   LUNGS: clear to auscultation  CARDIAC: Cor RRR EXTREMITIES: Edema noted to left arm, but has decreased in size since this am with elevation, Trace edema in bilateral ankles SKIN: Exposed skin is dry and intact; skin darker in appearance  NEURO: Awake and alert, intermittent confusion/forgetfulness, flat affect, ambulatory with walker   (Duration of visit and documentation 75 minutes)   Daryl Eastern, RN BSN

## 2019-09-10 NOTE — Telephone Encounter (Signed)
Please refill: Ondansetron No. 60, no refills Dronabinol No. 60 and 1 refill Carvedilol for 6 months

## 2019-09-11 ENCOUNTER — Emergency Department (HOSPITAL_COMMUNITY): Payer: Medicare Other

## 2019-09-11 ENCOUNTER — Inpatient Hospital Stay (HOSPITAL_COMMUNITY)
Admission: EM | Admit: 2019-09-11 | Discharge: 2019-09-18 | DRG: 385 | Disposition: A | Discharge status: Home Health | Payer: Medicare Other | Attending: Family Medicine | Admitting: Family Medicine

## 2019-09-11 ENCOUNTER — Encounter (HOSPITAL_COMMUNITY): Payer: Self-pay | Admitting: Emergency Medicine

## 2019-09-11 ENCOUNTER — Ambulatory Visit: Payer: Self-pay | Admitting: *Deleted

## 2019-09-11 DIAGNOSIS — Z79899 Other long term (current) drug therapy: Secondary | ICD-10-CM

## 2019-09-11 DIAGNOSIS — Z20828 Contact with and (suspected) exposure to other viral communicable diseases: Secondary | ICD-10-CM | POA: Diagnosis not present

## 2019-09-11 DIAGNOSIS — Y92009 Unspecified place in unspecified non-institutional (private) residence as the place of occurrence of the external cause: Secondary | ICD-10-CM | POA: Diagnosis not present

## 2019-09-11 DIAGNOSIS — K649 Unspecified hemorrhoids: Secondary | ICD-10-CM | POA: Diagnosis not present

## 2019-09-11 DIAGNOSIS — K3189 Other diseases of stomach and duodenum: Secondary | ICD-10-CM | POA: Diagnosis not present

## 2019-09-11 DIAGNOSIS — I5032 Chronic diastolic (congestive) heart failure: Secondary | ICD-10-CM | POA: Diagnosis present

## 2019-09-11 DIAGNOSIS — Z66 Do not resuscitate: Secondary | ICD-10-CM | POA: Diagnosis not present

## 2019-09-11 DIAGNOSIS — E440 Moderate protein-calorie malnutrition: Secondary | ICD-10-CM | POA: Insufficient documentation

## 2019-09-11 DIAGNOSIS — D539 Nutritional anemia, unspecified: Secondary | ICD-10-CM | POA: Diagnosis present

## 2019-09-11 DIAGNOSIS — R55 Syncope and collapse: Secondary | ICD-10-CM | POA: Diagnosis not present

## 2019-09-11 DIAGNOSIS — J309 Allergic rhinitis, unspecified: Secondary | ICD-10-CM | POA: Diagnosis present

## 2019-09-11 DIAGNOSIS — I10 Essential (primary) hypertension: Secondary | ICD-10-CM | POA: Diagnosis present

## 2019-09-11 DIAGNOSIS — K644 Residual hemorrhoidal skin tags: Secondary | ICD-10-CM | POA: Diagnosis not present

## 2019-09-11 DIAGNOSIS — Z882 Allergy status to sulfonamides status: Secondary | ICD-10-CM

## 2019-09-11 DIAGNOSIS — S0003XA Contusion of scalp, initial encounter: Secondary | ICD-10-CM | POA: Diagnosis present

## 2019-09-11 DIAGNOSIS — E8889 Other specified metabolic disorders: Secondary | ICD-10-CM | POA: Diagnosis present

## 2019-09-11 DIAGNOSIS — W1830XA Fall on same level, unspecified, initial encounter: Secondary | ICD-10-CM | POA: Diagnosis present

## 2019-09-11 DIAGNOSIS — D696 Thrombocytopenia, unspecified: Secondary | ICD-10-CM | POA: Diagnosis present

## 2019-09-11 DIAGNOSIS — K602 Anal fissure, unspecified: Secondary | ICD-10-CM | POA: Diagnosis present

## 2019-09-11 DIAGNOSIS — F32A Depression, unspecified: Secondary | ICD-10-CM | POA: Diagnosis present

## 2019-09-11 DIAGNOSIS — D631 Anemia in chronic kidney disease: Secondary | ICD-10-CM | POA: Diagnosis present

## 2019-09-11 DIAGNOSIS — F329 Major depressive disorder, single episode, unspecified: Secondary | ICD-10-CM | POA: Diagnosis present

## 2019-09-11 DIAGNOSIS — E785 Hyperlipidemia, unspecified: Secondary | ICD-10-CM | POA: Diagnosis present

## 2019-09-11 DIAGNOSIS — E7800 Pure hypercholesterolemia, unspecified: Secondary | ICD-10-CM | POA: Diagnosis not present

## 2019-09-11 DIAGNOSIS — K641 Second degree hemorrhoids: Secondary | ICD-10-CM | POA: Diagnosis not present

## 2019-09-11 DIAGNOSIS — Z992 Dependence on renal dialysis: Secondary | ICD-10-CM | POA: Diagnosis not present

## 2019-09-11 DIAGNOSIS — K5731 Diverticulosis of large intestine without perforation or abscess with bleeding: Secondary | ICD-10-CM | POA: Diagnosis present

## 2019-09-11 DIAGNOSIS — K5730 Diverticulosis of large intestine without perforation or abscess without bleeding: Secondary | ICD-10-CM | POA: Diagnosis not present

## 2019-09-11 DIAGNOSIS — N186 End stage renal disease: Secondary | ICD-10-CM | POA: Diagnosis present

## 2019-09-11 DIAGNOSIS — Z853 Personal history of malignant neoplasm of breast: Secondary | ICD-10-CM

## 2019-09-11 DIAGNOSIS — K50011 Crohn's disease of small intestine with rectal bleeding: Secondary | ICD-10-CM | POA: Diagnosis not present

## 2019-09-11 DIAGNOSIS — Z885 Allergy status to narcotic agent status: Secondary | ICD-10-CM

## 2019-09-11 DIAGNOSIS — Z9012 Acquired absence of left breast and nipple: Secondary | ICD-10-CM

## 2019-09-11 DIAGNOSIS — I213 ST elevation (STEMI) myocardial infarction of unspecified site: Secondary | ICD-10-CM | POA: Diagnosis not present

## 2019-09-11 DIAGNOSIS — E876 Hypokalemia: Secondary | ICD-10-CM | POA: Diagnosis present

## 2019-09-11 DIAGNOSIS — K449 Diaphragmatic hernia without obstruction or gangrene: Secondary | ICD-10-CM | POA: Diagnosis present

## 2019-09-11 DIAGNOSIS — Z515 Encounter for palliative care: Secondary | ICD-10-CM | POA: Diagnosis not present

## 2019-09-11 DIAGNOSIS — J449 Chronic obstructive pulmonary disease, unspecified: Secondary | ICD-10-CM | POA: Diagnosis present

## 2019-09-11 DIAGNOSIS — I499 Cardiac arrhythmia, unspecified: Secondary | ICD-10-CM | POA: Diagnosis not present

## 2019-09-11 DIAGNOSIS — K5090 Crohn's disease, unspecified, without complications: Secondary | ICD-10-CM | POA: Diagnosis not present

## 2019-09-11 DIAGNOSIS — Z8249 Family history of ischemic heart disease and other diseases of the circulatory system: Secondary | ICD-10-CM

## 2019-09-11 DIAGNOSIS — K635 Polyp of colon: Secondary | ICD-10-CM | POA: Diagnosis present

## 2019-09-11 DIAGNOSIS — K219 Gastro-esophageal reflux disease without esophagitis: Secondary | ICD-10-CM | POA: Diagnosis present

## 2019-09-11 DIAGNOSIS — Z803 Family history of malignant neoplasm of breast: Secondary | ICD-10-CM

## 2019-09-11 DIAGNOSIS — I132 Hypertensive heart and chronic kidney disease with heart failure and with stage 5 chronic kidney disease, or end stage renal disease: Secondary | ICD-10-CM | POA: Diagnosis not present

## 2019-09-11 DIAGNOSIS — R4020 Unspecified coma: Secondary | ICD-10-CM | POA: Diagnosis not present

## 2019-09-11 DIAGNOSIS — F320 Major depressive disorder, single episode, mild: Secondary | ICD-10-CM | POA: Diagnosis not present

## 2019-09-11 DIAGNOSIS — Z7189 Other specified counseling: Secondary | ICD-10-CM | POA: Diagnosis not present

## 2019-09-11 DIAGNOSIS — I890 Lymphedema, not elsewhere classified: Secondary | ICD-10-CM | POA: Diagnosis present

## 2019-09-11 DIAGNOSIS — K50019 Crohn's disease of small intestine with unspecified complications: Secondary | ICD-10-CM | POA: Diagnosis not present

## 2019-09-11 DIAGNOSIS — M1990 Unspecified osteoarthritis, unspecified site: Secondary | ICD-10-CM | POA: Diagnosis present

## 2019-09-11 DIAGNOSIS — H3530 Unspecified macular degeneration: Secondary | ICD-10-CM | POA: Diagnosis present

## 2019-09-11 DIAGNOSIS — S0990XA Unspecified injury of head, initial encounter: Secondary | ICD-10-CM

## 2019-09-11 DIAGNOSIS — D500 Iron deficiency anemia secondary to blood loss (chronic): Secondary | ICD-10-CM | POA: Diagnosis not present

## 2019-09-11 DIAGNOSIS — N2581 Secondary hyperparathyroidism of renal origin: Secondary | ICD-10-CM | POA: Diagnosis not present

## 2019-09-11 DIAGNOSIS — K625 Hemorrhage of anus and rectum: Secondary | ICD-10-CM | POA: Diagnosis not present

## 2019-09-11 DIAGNOSIS — J9811 Atelectasis: Secondary | ICD-10-CM | POA: Diagnosis not present

## 2019-09-11 DIAGNOSIS — D649 Anemia, unspecified: Secondary | ICD-10-CM | POA: Diagnosis not present

## 2019-09-11 DIAGNOSIS — M109 Gout, unspecified: Secondary | ICD-10-CM | POA: Diagnosis present

## 2019-09-11 DIAGNOSIS — M7989 Other specified soft tissue disorders: Secondary | ICD-10-CM | POA: Diagnosis not present

## 2019-09-11 DIAGNOSIS — Z7952 Long term (current) use of systemic steroids: Secondary | ICD-10-CM

## 2019-09-11 DIAGNOSIS — K319 Disease of stomach and duodenum, unspecified: Secondary | ICD-10-CM | POA: Diagnosis not present

## 2019-09-11 DIAGNOSIS — K922 Gastrointestinal hemorrhage, unspecified: Secondary | ICD-10-CM | POA: Diagnosis present

## 2019-09-11 DIAGNOSIS — R159 Full incontinence of feces: Secondary | ICD-10-CM | POA: Diagnosis present

## 2019-09-11 DIAGNOSIS — K6289 Other specified diseases of anus and rectum: Secondary | ICD-10-CM | POA: Diagnosis not present

## 2019-09-11 DIAGNOSIS — R42 Dizziness and giddiness: Secondary | ICD-10-CM | POA: Diagnosis not present

## 2019-09-11 DIAGNOSIS — D62 Acute posthemorrhagic anemia: Secondary | ICD-10-CM | POA: Diagnosis present

## 2019-09-11 DIAGNOSIS — K921 Melena: Secondary | ICD-10-CM | POA: Diagnosis not present

## 2019-09-11 DIAGNOSIS — Z03818 Encounter for observation for suspected exposure to other biological agents ruled out: Secondary | ICD-10-CM | POA: Diagnosis not present

## 2019-09-11 DIAGNOSIS — I2510 Atherosclerotic heart disease of native coronary artery without angina pectoris: Secondary | ICD-10-CM | POA: Diagnosis present

## 2019-09-11 LAB — MAGNESIUM: Magnesium: 1.9 mg/dL (ref 1.7–2.4)

## 2019-09-11 LAB — POC OCCULT BLOOD, ED: Fecal Occult Bld: POSITIVE — AB

## 2019-09-11 LAB — PROTIME-INR
INR: 1 (ref 0.8–1.2)
Prothrombin Time: 13.2 seconds (ref 11.4–15.2)

## 2019-09-11 LAB — BASIC METABOLIC PANEL
Anion gap: 20 — ABNORMAL HIGH (ref 5–15)
BUN: 43 mg/dL — ABNORMAL HIGH (ref 8–23)
CO2: 19 mmol/L — ABNORMAL LOW (ref 22–32)
Calcium: 8.8 mg/dL — ABNORMAL LOW (ref 8.9–10.3)
Chloride: 94 mmol/L — ABNORMAL LOW (ref 98–111)
Creatinine, Ser: 6.43 mg/dL — ABNORMAL HIGH (ref 0.44–1.00)
GFR calc Af Amer: 6 mL/min — ABNORMAL LOW (ref >60)
GFR calc non Af Amer: 6 mL/min — ABNORMAL LOW (ref >60)
Glucose, Bld: 97 mg/dL (ref 70–99)
Potassium: 3.2 mmol/L — ABNORMAL LOW (ref 3.5–5.1)
Sodium: 133 mmol/L — ABNORMAL LOW (ref 135–145)

## 2019-09-11 LAB — SARS CORONAVIRUS 2 (TAT 6-24 HRS): SARS Coronavirus 2: NEGATIVE

## 2019-09-11 LAB — LACTIC ACID, PLASMA
Lactic Acid, Venous: 1.1 mmol/L (ref 0.5–1.9)
Lactic Acid, Venous: 2.6 mmol/L (ref 0.5–1.9)

## 2019-09-11 LAB — CBC
HCT: 28.6 % — ABNORMAL LOW (ref 36.0–46.0)
Hemoglobin: 8.5 g/dL — ABNORMAL LOW (ref 12.0–15.0)
MCH: 33.2 pg (ref 26.0–34.0)
MCHC: 29.7 g/dL — ABNORMAL LOW (ref 30.0–36.0)
MCV: 111.7 fL — ABNORMAL HIGH (ref 80.0–100.0)
Platelets: 155 10*3/uL (ref 150–400)
RBC: 2.56 MIL/uL — ABNORMAL LOW (ref 3.87–5.11)
RDW: 19.8 % — ABNORMAL HIGH (ref 11.5–15.5)
WBC: 9 10*3/uL (ref 4.0–10.5)
nRBC: 2.2 % — ABNORMAL HIGH (ref 0.0–0.2)

## 2019-09-11 LAB — TROPONIN I (HIGH SENSITIVITY)
Troponin I (High Sensitivity): 37 ng/L — ABNORMAL HIGH (ref <18)
Troponin I (High Sensitivity): 41 ng/L — ABNORMAL HIGH (ref <18)

## 2019-09-11 LAB — PHOSPHORUS: Phosphorus: 2.2 mg/dL — ABNORMAL LOW (ref 2.5–4.6)

## 2019-09-11 LAB — VITAMIN B12: Vitamin B-12: >7500 pg/mL — ABNORMAL HIGH (ref 180–914)

## 2019-09-11 LAB — TSH: TSH: 1.022 u[IU]/mL (ref 0.350–4.500)

## 2019-09-11 LAB — APTT: aPTT: 21 seconds — ABNORMAL LOW (ref 24–36)

## 2019-09-11 MED ORDER — PENTAFLUOROPROP-TETRAFLUOROETH EX AERO
1.0000 "application " | INHALATION_SPRAY | CUTANEOUS | Status: DC | PRN
  Filled 2019-09-11: qty 116

## 2019-09-11 MED ORDER — SODIUM CHLORIDE 0.9 % IV SOLN
100.0000 mL | INTRAVENOUS | Status: DC | PRN
  Administered 2019-09-17: 09:00:00 via INTRAVENOUS

## 2019-09-11 MED ORDER — HYDRALAZINE HCL 20 MG/ML IJ SOLN
10.0000 mg | INTRAMUSCULAR | Status: DC | PRN
  Administered 2019-09-11 – 2019-09-17 (×2): 10 mg via INTRAVENOUS
  Filled 2019-09-11: qty 1

## 2019-09-11 MED ORDER — ALTEPLASE 2 MG IJ SOLR: 2.0000 mg | Freq: Once | INTRAMUSCULAR | Status: DC | PRN

## 2019-09-11 MED ORDER — HEPARIN SODIUM (PORCINE) 1000 UNIT/ML DIALYSIS
1000.0000 [IU] | INTRAMUSCULAR | Status: DC | PRN
  Administered 2019-09-15: 13:00:00 3200 [IU] via INTRAVENOUS_CENTRAL
  Filled 2019-09-11 (×2): qty 1

## 2019-09-11 MED ORDER — ONDANSETRON HCL 4 MG PO TABS: 4.0000 mg | ORAL_TABLET | Freq: Four times a day (QID) | ORAL | Status: DC | PRN

## 2019-09-11 MED ORDER — LIDOCAINE-PRILOCAINE 2.5-2.5 % EX CREA
1.0000 "application " | TOPICAL_CREAM | CUTANEOUS | Status: DC | PRN
  Filled 2019-09-11: qty 5

## 2019-09-11 MED ORDER — ONDANSETRON HCL 4 MG/2ML IJ SOLN: 4.0000 mg | Freq: Four times a day (QID) | INTRAMUSCULAR | Status: DC | PRN

## 2019-09-11 MED ORDER — SODIUM CHLORIDE 0.9 % IV SOLN: 100.0000 mL | INTRAVENOUS | Status: DC | PRN

## 2019-09-11 MED ORDER — CHLORHEXIDINE GLUCONATE CLOTH 2 % EX PADS
6.0000 | MEDICATED_PAD | Freq: Every day | CUTANEOUS | Status: DC
  Administered 2019-09-12 – 2019-09-13 (×2): 6 via TOPICAL

## 2019-09-11 MED ORDER — LIDOCAINE HCL (PF) 1 % IJ SOLN: 5.0000 mL | INTRAMUSCULAR | Status: DC | PRN

## 2019-09-11 NOTE — ED Provider Notes (Signed)
[facility] Provider Note   CSN: [id number] Arrival date & time: 09/11/19  1100     History   Chief Complaint Chief Complaint  Patient presents with  . Fall  . Dizziness    HPI Samantha Mckenzie is a 83 y.o. female.     Patient is an 83 year old female with past medical history of breast cancer with left arm lymphedema, end-stage renal disease on hemodialysis, hypertension.  She presents today for evaluation of fall.  Patient apparently became dizzy and fell, striking her head.  She is uncertain as to whether she lost consciousness.  She has a bump to the left of the occiput.  She denies any neck pain.  She denies any headache.  Patient does report having bloody stools, but denies any abdominal pain or fever.  She reports having to have been transfused in the past for similar issues.  She started dialysis approximately 1 month ago and is receiving this through a catheter in the right subclavian.  She does have a fistula in the right arm, however this has not matured for use.    The history is provided by the patient.  Fall This is a new problem. The current episode started less than 1 hour ago. The problem occurs constantly. The problem has not changed since onset.Pertinent negatives include no abdominal pain. Nothing aggravates the symptoms. Nothing relieves the symptoms. She has tried nothing for the symptoms.    Past Medical History:  Diagnosis Date  . Allergic rhinitis   . Anemia   . Breast CA ([place])    surgery, chemo, XRT; had peripheral neuropathy (imbalance at times) after chemo  . Cellulitis    Left arm, recurrent   . Cellulitis LEFT arm recurrent 12/08/2011  . Chronic renal disease, stage IV (HCC)    Dr. Moshe Cipro  . Depression   . Fatigue   . GERD (gastroesophageal reflux disease)    gastritis, EGD 02/2007  . Gout   . Hyperlipidemia   . Hypertension   . Macular degeneration 2011  . Normal cardiac stress test 12/2014  .  Osteoarthritis   . Renal insufficiency     Patient Active Problem List   Diagnosis Date Noted  . Cellulitis of arm, left 08/02/2019  . Acidosis   . Dementia ([place]) 06/17/2019  . Abnormal CT scan, small bowel   . Dehydration 05/31/2019  . Acute blood loss anemia   . Ileitis 05/30/2019  . Terminal ileitis with complication ([place]) 78/29/5621  . COPD (chronic obstructive pulmonary disease) ([place]) 04/12/2019  . Enteritis 04/07/2019  . Chronic diastolic CHF (congestive heart failure) ([place]) 04/07/2019  . Hypokalemia 04/07/2019  . Hypocalcemia 04/07/2019  . Fall 04/07/2019  . Occult blood positive stool 04/07/2019  . Adjustment disorder with depressed mood   . Generalized abdominal pain   . Dysphasia 03/21/2019  . CAP (community acquired pneumonia) 03/20/2019  . Nausea vomiting and diarrhea 03/20/2019  . Acute respiratory failure with hypoxia ([place]) 03/20/2019  . Acute renal failure superimposed on stage 4 chronic kidney disease ([place]) 03/20/2019  . Lactic acidosis 03/20/2019  . Diabetic peripheral neuropathy associated with type 2 diabetes mellitus ([place]) 03/20/2019  . Hypomagnesemia 06/21/2018  . Generalized weakness 06/20/2018  . PCP NOTES >>> 07/11/2015  . Sepsis ([place]) 04/19/2013  . Depression   . VITAMIN B12 DEFICIENCY 04/01/2008  . GERD 12/19/2007  . NEOP, MALIGNANT, FEMALE BREAST NOS 07/07/2007  . Osteoarthritis 07/07/2007  . Diabetes mellitus type 2 with complications ([place]) 30/86/5784  .  Hyperlipidemia 02/28/2007  . GOUT 02/28/2007  . Macrocytic anemia 02/28/2007  . Essential hypertension 02/28/2007    Past Surgical History:  Procedure Laterality Date  . AV FISTULA PLACEMENT Right 08/10/2019   Procedure: ARTERIOVENOUS (AV) FISTULA CREATION VERSUS GRAFT PLACEMENT;  Surgeon: Angelia Mould, MD;  Location: [facility];  Service: Vascular;  Laterality: Right;  . BIOPSY  06/06/2019   Procedure: BIOPSY;  Surgeon: Ladene Artist, MD;  Location: [facility];  Service:  Endoscopy;;  . cataracts bilaterally  1 -2012  . COLONOSCOPY WITH PROPOFOL N/A 06/06/2019   Procedure: COLONOSCOPY WITH PROPOFOL;  Surgeon: Ladene Artist, MD;  Location: [facility];  Service: Endoscopy;  Laterality: N/A;  . IR FLUORO GUIDE CV LINE RIGHT  08/03/2019  . IR US GUIDE VASC ACCESS RIGHT  08/03/2019  . LUNG BIOPSY  1999   neg  . MASTECTOMY Left 1999   and lymphnodes      OB History   No obstetric history on file.      Home Medications    Prior to Admission medications   Medication Sig Start Date End Date Taking? Authorizing Provider  acetaminophen (TYLENOL) 325 MG tablet Take 2 tablets (650 mg total) by mouth every 6 (six) hours as needed for mild pain, fever or headache (or Fever >/= 101). 04/12/19   Emokpae, Courage, MD  allopurinol (ZYLOPRIM) 100 MG tablet Take 1 tablet (100 mg total) by mouth daily. 06/29/19   Granville Lewis C, PA-C  amLODipine (NORVASC) 10 MG tablet Take 1 tablet (10 mg total) by mouth daily. 07/24/19   Colon Branch, MD  atorvastatin (LIPITOR) 20 MG tablet Take 1 tablet (20 mg total) by mouth daily. 06/29/19   Wille Celeste, PA-C  Calcium Carb-Cholecalciferol (CALTRATE 600+D3 PO) Take 1 tablet by mouth 2 (two) times daily.     [provider]  carvedilol (COREG) 6.25 MG tablet Take 1 tablet (6.25 mg total) by mouth 2 (two) times daily with a meal. 09/10/19 10/10/19  Colon Branch, MD  Cholecalciferol (VITAMIN D3) 1000 UNITS CAPS Take 1 capsule by mouth daily.     [provider]  Darbepoetin Alfa (ARANESP) 100 MCG/0.5ML SOSY injection Inject 0.5 mLs (100 mcg total) into the vein every Saturday with hemodialysis. 08/11/19   Cherylann Ratel A, DO  dicyclomine (BENTYL) 10 MG capsule Take 2 capsules (20 mg total) by mouth 4 (four) times daily -  before meals and at bedtime. 06/29/19   Wille Celeste, PA-C  dronabinol (MARINOL) 2.5 MG capsule Take 1 capsule (2.5 mg total) by mouth 2 (two) times daily before a meal. 09/10/19   Paz, Alda Berthold, MD   escitalopram (LEXAPRO) 10 MG tablet Take 2 tablets (20 mg total) by mouth daily. 07/06/19   Colon Branch, MD  hydrALAZINE (APRESOLINE) 10 MG tablet Take 1 tablet (10 mg total) by mouth every 8 (eight) hours. 08/10/19 09/09/19  Cherylann Ratel A, DO  loperamide (IMODIUM A-D) 2 MG tablet Take 2 mg by mouth 4 (four) times daily as needed for diarrhea or loose stools.    [provider]  Multiple Vitamins-Minerals (CENTRUM SILVER PO) Take 1 tablet by mouth daily.     [provider]  ondansetron (ZOFRAN) 4 MG tablet Take 1 tablet (4 mg total) by mouth 4 (four) times daily. 09/10/19   Colon Branch, MD  pantoprazole (PROTONIX) 40 MG tablet Take 1 tablet (40 mg total) by mouth daily before breakfast. 07/24/19   Colon Branch, MD  predniSONE (DELTASONE) 10 MG tablet Take 1 tablets a day (57m) for one month; then take 1.5 tablets a day (345m for 2 weeks; then take 1 tablet a day (2087muntil further notice 07/12/19   PerIrene ShipperD  predniSONE (DELTASONE) 20 MG tablet Prednisone 40 mg daily during the month of October 2.  Prednisone 30 mg daily from November 1 through November 15 3.  Prednisone 20 mg daily from November 16 until further notice 07/23/19   PerIrene ShipperD  vitamin B-12 (CYANOCOBALAMIN) 1000 MCG tablet Take 1,000 mcg by mouth daily.      [provider]  vitamin C (ASCORBIC ACID) 500 MG tablet Take 500 mg by mouth daily.    [provider]    Family History Family History  Problem Relation Age of Onset  . Breast cancer Other        ? of   . Heart attack Father 86 49 Diabetes Sister        2  . Hypertension Sister   . Colon cancer Neg Hx   . Stomach cancer Neg Hx     Social History Social History   Tobacco Use  . Smoking status: Never Smoker  . Smokeless tobacco: Never Used  Substance Use Topics  . Alcohol use: Not Currently    Alcohol/week: 0.0 standard drinks    Comment: rarely  . Drug use: No     Allergies   Codeine, Morphine,  Oxycodone-acetaminophen, Sulfonamide derivatives, and Tramadol hcl   Review of Systems Review of Systems  Gastrointestinal: Negative for abdominal pain.  All other systems reviewed and are negative.    Physical Exam Updated Vital Signs BP (!) 202/54 (BP Location: Other (Comment)) Comment (BP Location): left leg, cannot use either arm due to dialysis and previous surgery  Pulse 62   Temp 98.4 F (36.9 C) (Oral)   Resp 16   SpO2 98%   Physical Exam Vitals signs and nursing note reviewed.  Constitutional:      General: She is not in acute distress.    Appearance: She is well-developed. She is not diaphoretic.     Comments: Patient is awake and alert.  She is somewhat pale in appearance.  HENT:     Head: Normocephalic and atraumatic.     Comments: There is a 3 cm round contusion just left and superior to the occiput.  There is no palpable defect. Neck:     Musculoskeletal: Normal range of motion and neck supple.     Comments: There is no cervical spine tenderness.  Patient has painless range of motion in all directions. Cardiovascular:     Rate and Rhythm: Normal rate and regular rhythm.     Heart sounds: No murmur. No friction rub. No gallop.   Pulmonary:     Effort: Pulmonary effort is normal. No respiratory distress.     Breath sounds: Normal breath sounds. No wheezing.  Abdominal:     General: Bowel sounds are normal. There is no distension.     Palpations: Abdomen is soft.     Tenderness: There is no abdominal tenderness.  Genitourinary:    Rectum: Normal. Guaiac result positive.  Musculoskeletal: Normal range of motion.  Skin:    General: Skin is warm and dry.  Neurological:     Mental Status: She is alert and oriented to person, place, and time.     Cranial Nerves: No cranial nerve deficit.     Sensory: No sensory deficit.  Motor: No weakness.     Coordination: Coordination normal.      ED Treatments / Results  Labs (all labs ordered are listed, but  only abnormal results are displayed) Labs Reviewed  BASIC METABOLIC PANEL  CBC  URINALYSIS, ROUTINE W REFLEX MICROSCOPIC  POC OCCULT BLOOD, ED  TYPE AND SCREEN    EKG None  Radiology No results found.  Procedures Procedures (including critical care time)  Medications Ordered in ED Medications - No data to display   Initial Impression / Assessment and Plan / ED Course  I have reviewed the triage vital signs and the nursing notes.  Pertinent labs & imaging results that were available during my care of the patient were reviewed by me and considered in my medical decision making (see chart for details).  Patient is an 83 year old female with history of breast cancer and prior GI bleed as well as end-stage renal disease on hemodialysis.  She presents today for evaluation of fall.  Patient became dizzy and struck her head on the ground.  She has a contusion to the scalp but is neurologically intact.  Patient also reports she has been having bloody stools recently.  Her rectal examination reveals grossly bloody, maroon-colored stool that is heme positive.  Her hemoglobin today is 8.5 which is not far off her baseline.  I am concerned that her hemoglobin may continue to drop and that GI bleeding could be the cause of her lightheadedness.  I feel as though patient will require admission for trending of hemoglobin and further monitoring.  She has been hemodynamically stable while here in the ER.  Final Clinical Impressions(s) / ED Diagnoses   Final diagnoses:  None    ED Discharge Orders    None       Veryl Speak, MD 09/11/19 1614

## 2019-09-11 NOTE — H&P (Signed)
History and Physical    [name]:[id number] DOB: 11/20/1935 DOA: 09/11/2019  PCP: Colon Branch, MD  Patient coming from: Home  I have personally briefly reviewed patient's old medical records in [place]  Chief Complaint: Syncope and GI bleed  HPI: Samantha Mckenzie is a 83 y.o. female with medical history significant of depression, breast cancer status post left mastectomy/chemotherapy/radiation therapy, anemia of chronic disease, Crohn's ileitis, coronary artery disease, end-stage renal disease-recently started on hemodialysis, GERD presents to emergency department after a syncopal episode this morning.  Patient tells me that while she was getting ready for her dialysis appointment she fell on the floor and hit on her head.  She found herself on the floor after a few minutes.  She is uncertain as to whether she lost consciousness.  Denies headache, blurry vision, chest pain, shortness of breath, palpitation, seizures, urinary or bowel incontinence.  She tells me that she was dizzy this morning.  Reports bloody stools since 1 month.  Reports decreased appetite and weight loss however denies association with nausea, vomiting, epigastric pain, family history of colon cancer, over-the-counter use of NSAIDs or history of hemorrhoids.  She tells me that she had loose stool with bright red blood this morning.  She had a colonoscopy on 06/06/2019 which showed terminal ileum stricture and diverticulosis in left colon-biopsy came back suspicious for Crohn's disease and was treated with IV Solu-Medrol and discharged home on 10 weeks of tapering prednisone.  She started on hemodialysis approximately 1 month ago and has been compliant with her appointments however she missed her dialysis appointment this morning due to syncopal episode.  She lives with her husband, denies smoking, alcohol, illicit drug use.  ED Course: On arrival patient's blood pressure noted to be elevated, fecal occult  positive, H&H 8.5/28.6, MCV 111.7, potassium: 3.2, chest x-ray negative for acute finding, CT head negative for stroke, COVID-19 pending.  EDP consulted GI for further evaluation and management.  Review of Systems: As per HPI otherwise negative.    Past Medical History:  Diagnosis Date  . Allergic rhinitis   . Anemia   . Breast CA ([facility])    surgery, chemo, XRT; had peripheral neuropathy (imbalance at times) after chemo  . Cellulitis    Left arm, recurrent   . Cellulitis LEFT arm recurrent 12/08/2011  . Chronic renal disease, stage IV (HCC)    Dr. Moshe Cipro  . Depression   . Fatigue   . GERD (gastroesophageal reflux disease)    gastritis, EGD 02/2007  . Gout   . Hyperlipidemia   . Hypertension   . Macular degeneration 2011  . Normal cardiac stress test 12/2014  . Osteoarthritis   . Renal insufficiency     Past Surgical History:  Procedure Laterality Date  . AV FISTULA PLACEMENT Right 08/10/2019   Procedure: ARTERIOVENOUS (AV) FISTULA CREATION VERSUS GRAFT PLACEMENT;  Surgeon: Angelia Mould, MD;  Location: [facility];  Service: Vascular;  Laterality: Right;  . BIOPSY  06/06/2019   Procedure: BIOPSY;  Surgeon: Ladene Artist, MD;  Location: [facility];  Service: Endoscopy;;  . cataracts bilaterally  1 -2012  . COLONOSCOPY WITH PROPOFOL N/A 06/06/2019   Procedure: COLONOSCOPY WITH PROPOFOL;  Surgeon: Ladene Artist, MD;  Location: [facility];  Service: Endoscopy;  Laterality: N/A;  . IR FLUORO GUIDE CV LINE RIGHT  08/03/2019  . IR US GUIDE VASC ACCESS RIGHT  08/03/2019  . LUNG BIOPSY  1999   neg  . MASTECTOMY  Left 1999   and lymphnodes      reports that she has never smoked. She has never used smokeless tobacco. She reports previous alcohol use. She reports that she does not use drugs.  Allergies  Allergen Reactions  . Codeine Nausea And Vomiting  . Morphine Nausea And Vomiting  . Oxycodone-Acetaminophen Nausea And Vomiting  . Sulfonamide Derivatives Nausea  And Vomiting  . Tramadol Hcl Nausea And Vomiting    Family History  Problem Relation Age of Onset  . Breast cancer Other        ? of   . Heart attack Father 34  . Diabetes Sister        2  . Hypertension Sister   . Colon cancer Neg Hx   . Stomach cancer Neg Hx     Prior to Admission medications   Medication Sig Start Date End Date Taking? Authorizing Provider  acetaminophen (TYLENOL) 325 MG tablet Take 2 tablets (650 mg total) by mouth every 6 (six) hours as needed for mild pain, fever or headache (or Fever >/= 101). 04/12/19   Emokpae, Courage, MD  allopurinol (ZYLOPRIM) 100 MG tablet Take 1 tablet (100 mg total) by mouth daily. 06/29/19   Granville Lewis C, PA-C  amLODipine (NORVASC) 10 MG tablet Take 1 tablet (10 mg total) by mouth daily. 07/24/19   Colon Branch, MD  atorvastatin (LIPITOR) 20 MG tablet Take 1 tablet (20 mg total) by mouth daily. 06/29/19   Wille Celeste, PA-C  Calcium Carb-Cholecalciferol (CALTRATE 600+D3 PO) Take 1 tablet by mouth 2 (two) times daily.     [provider]  carvedilol (COREG) 6.25 MG tablet Take 1 tablet (6.25 mg total) by mouth 2 (two) times daily with a meal. 09/10/19 10/10/19  Colon Branch, MD  Cholecalciferol (VITAMIN D3) 1000 UNITS CAPS Take 1 capsule by mouth daily.     [provider]  Darbepoetin Alfa (ARANESP) 100 MCG/0.5ML SOSY injection Inject 0.5 mLs (100 mcg total) into the vein every Saturday with hemodialysis. 08/11/19   Cherylann Ratel A, DO  dicyclomine (BENTYL) 10 MG capsule Take 2 capsules (20 mg total) by mouth 4 (four) times daily -  before meals and at bedtime. 06/29/19   Wille Celeste, PA-C  dronabinol (MARINOL) 2.5 MG capsule Take 1 capsule (2.5 mg total) by mouth 2 (two) times daily before a meal. 09/10/19   Paz, Alda Berthold, MD  escitalopram (LEXAPRO) 10 MG tablet Take 2 tablets (20 mg total) by mouth daily. 07/06/19   Colon Branch, MD  hydrALAZINE (APRESOLINE) 10 MG tablet Take 1 tablet (10 mg total) by mouth every 8 (eight)  hours. 08/10/19 09/09/19  Cherylann Ratel A, DO  loperamide (IMODIUM A-D) 2 MG tablet Take 2 mg by mouth 4 (four) times daily as needed for diarrhea or loose stools.    [provider]  Multiple Vitamins-Minerals (CENTRUM SILVER PO) Take 1 tablet by mouth daily.     [provider]  ondansetron (ZOFRAN) 4 MG tablet Take 1 tablet (4 mg total) by mouth 4 (four) times daily. 09/10/19   Colon Branch, MD  pantoprazole (PROTONIX) 40 MG tablet Take 1 tablet (40 mg total) by mouth daily before breakfast. 07/24/19   Colon Branch, MD  predniSONE (DELTASONE) 10 MG tablet Take 1 tablets a day (86m) for one month; then take 1.5 tablets a day (327m for 2 weeks; then take 1 tablet a day (2018muntil further notice 07/12/19   PerScarlette Shorts  N, MD  predniSONE (DELTASONE) 20 MG tablet Prednisone 40 mg daily during the month of October 2.  Prednisone 30 mg daily from November 1 through November 15 3.  Prednisone 20 mg daily from November 16 until further notice 07/23/19   Irene Shipper, MD  vitamin B-12 (CYANOCOBALAMIN) 1000 MCG tablet Take 1,000 mcg by mouth daily.      [provider]  vitamin C (ASCORBIC ACID) 500 MG tablet Take 500 mg by mouth daily.    [provider]    Physical Exam: Vitals:   09/11/19 1400 09/11/19 1415 09/11/19 1430 09/11/19 1545  BP: (!) 208/52 (!) 198/52 (!) 209/53 (!) 170/72  Pulse: 61 62 (!) 58 61  Resp:   14 13  Temp:      TempSrc:      SpO2: 98% 98% 99% 100%    Constitutional: NAD, calm, comfortable Eyes: PERRL, lids and conjunctivae: Pale ENMT: Mucous membranes are moist. Posterior pharynx clear of any exudate or lesions.Normal dentition.  Neck: normal, supple, no masses, no thyromegaly Respiratory: clear to auscultation bilaterally, no wheezing, no crackles. Normal respiratory effort. No accessory muscle use.  Dialysis catheter on left side of upper chest. Cardiovascular: Regular rate and rhythm, no murmurs / rubs / gallops.  2+ bilateral  pitting edema positive 2+ pedal pulses. No carotid bruits.  Abdomen: no tenderness, no masses palpated. No hepatosplenomegaly. Bowel sounds positive.  Musculoskeletal: no clubbing / cyanosis. No joint deformity upper and lower extremities. Good ROM, no contractures. Normal muscle tone.  Skin: no rashes, lesions, ulcers. No induration Neurologic: CN 2-12 grossly intact. Sensation intact, DTR normal. Strength 5/5 in all 4.  Psychiatric: Normal judgment and insight. Alert and oriented x 3. Normal mood.    Labs on Admission: I have personally reviewed following labs and imaging studies  CBC: Recent Labs  Lab 09/11/19 1222  WBC 9.0  HGB 8.5*  HCT 28.6*  MCV 111.7*  PLT 539   Basic Metabolic Panel: Recent Labs  Lab 09/11/19 1222  NA 133*  K 3.2*  CL 94*  CO2 19*  GLUCOSE 97  BUN 43*  CREATININE 6.43*  CALCIUM 8.8*   GFR: CrCl cannot be calculated (Unknown ideal weight.). Liver Function Tests: No results for input(s): AST, ALT, ALKPHOS, BILITOT, PROT, ALBUMIN in the last 168 hours. No results for input(s): LIPASE, AMYLASE in the last 168 hours. No results for input(s): AMMONIA in the last 168 hours. Coagulation Profile: No results for input(s): INR, PROTIME in the last 168 hours. Cardiac Enzymes: No results for input(s): CKTOTAL, CKMB, CKMBINDEX, TROPONINI in the last 168 hours. BNP (last 3 results) No results for input(s): PROBNP in the last 8760 hours. HbA1C: No results for input(s): HGBA1C in the last 72 hours. CBG: No results for input(s): GLUCAP in the last 168 hours. Lipid Profile: No results for input(s): CHOL, HDL, LDLCALC, TRIG, CHOLHDL, LDLDIRECT in the last 72 hours. Thyroid Function Tests: No results for input(s): TSH, T4TOTAL, FREET4, T3FREE, THYROIDAB in the last 72 hours. Anemia Panel: No results for input(s): VITAMINB12, FOLATE, FERRITIN, TIBC, IRON, RETICCTPCT in the last 72 hours. Urine analysis:    Component Value Date/Time   COLORURINE YELLOW  08/02/2019 [place] 08/02/2019 2254   LABSPEC 1.010 08/02/2019 2254   PHURINE 5.0 08/02/2019 2254   GLUCOSEU 50 (A) 08/02/2019 2254   HGBUR NEGATIVE 08/02/2019 [place] NEGATIVE 08/02/2019 [place] 08/02/2019 [place] NEGATIVE 08/02/2019 2254  UROBILINOGEN 0.2 12/29/2014 1400   NITRITE NEGATIVE 08/02/2019 2254   LEUKOCYTESUR NEGATIVE 08/02/2019 2254    Radiological Exams on Admission: Dg Chest 2 View  Result Date: 09/11/2019 CLINICAL DATA:  Weakness. EXAM: CHEST - 2 VIEW COMPARISON:  May 30, 2019. FINDINGS: The heart size and mediastinal contours are within normal limits. Interval placement of right internal jugular dialysis catheter with distal tip in expected position of right atrium. No pneumothorax or pleural effusion is noted. Mild bibasilar subsegmental atelectasis is noted. The visualized skeletal structures are unremarkable. IMPRESSION: Interval placement of right internal jugular dialysis catheter with distal tip in expected position of right atrium. Mild bibasilar subsegmental atelectasis is noted. Electronically Signed   By: Marijo Conception M.D.   On: 09/11/2019 15:20   Ct Head Wo Contrast  Result Date: 09/11/2019 CLINICAL DATA:  Patient became dizzy and fell. Possible brief loss of consciousness. Patient hit the back of her head. EXAM: CT HEAD WITHOUT CONTRAST TECHNIQUE: Contiguous axial images were obtained from the base of the skull through the vertex without intravenous contrast. COMPARISON:  05/30/2019 FINDINGS: Brain: No evidence of acute infarction, hemorrhage, hydrocephalus, extra-axial collection or mass lesion/mass effect. There is mild ventricular sulcal enlargement reflecting age related volume loss. Mild periventricular white matter hypoattenuation is also noted consistent with chronic microvascular ischemic change. Vascular: No hyperdense vessel or unexpected calcification. Skull: Normal. Negative for fracture or  focal lesion. Sinuses/Orbits: Globes and orbits are unremarkable. The visualized sinuses and mastoid air cells are clear. Other: Small left parietal scalp hematoma. IMPRESSION: 1. No acute intracranial abnormalities. 2. Age related volume loss. Mild chronic microvascular ischemic change. 3. Small left parietal scalp hematoma. Electronically Signed   By: Lajean Manes M.D.   On: 09/11/2019 15:41    EKG: Normal sinus rhythm, no ST elevation or depression noted.  Assessment/Plan Principal Problem:   Symptomatic anemia Active Problems:   HLD (hyperlipidemia)   Gout   Macrocytic anemia   Essential hypertension   GERD (gastroesophageal reflux disease)   Depression   Hypokalemia   ESRD (end stage renal disease) on dialysis (HCC)   GI bleed    Symptomatic anemia: -Patient reports dizziness and had syncopal episode this morning.  Likely secondary from lower GI bleed/diverticular bleed.  Patient has bloody stools since 1 month. -Reviewed colonoscopy from 06/06/2019 which showed terminal ileal stricture and left colon diverticulosis-biopsy-concern for Crohn's disease-patient discharged home on 10 weeks of prednisone tapering dose. -CT head negative for acute findings.  On telemetry. -Admit patient on the floor for close monitoring. -Patient's H&H is 8.5/28.6-was 8.8/27.1--> 1 month ago, fecal occult positive. -Monitor H&H closely, transfuse as needed -EDP consulted GI-await recommendations -We will keep her n.p.o. for now, Zofran as needed for nausea and vomiting.  End-stage renal disease on hemodialysis: -Patient missed dialysis this morning. -Her blood pressure is elevated upon arrival, has chronic leg swelling. -Potassium: 3.2.  Consulted nephrology for dialysis -Strict INO's and daily weight.  Grade 1 diastolic congestive heart failure: -Reviewed echo from 06/07/2017 which showed ejection fraction of 55% and grade 1 diastolic dysfunction. -Chest x-ray negative for fluid overload.   Patient has leg edema. -Strict INO's and daily weight.  Monitor electrolytes closely.  Hypertension: Elevated -Likely secondary from missed dialysis this morning. -We will continue her home meds and monitor her blood pressure closely.  Macrocytic anemia: -Check B12, folate and TSH.  GERD: Continue Protonix  Depression: Continue Lexapro  Gout: Continue allopurinol  Hyperlipidemia: Continue statin  Unable to safely start patient's  home medication at this time as med reconciliation is pending by pharmacy.  DVT prophylaxis: TED/SCD Code Status: DNR-confirmed with the patient  family Communication: None present at bedside.  Plan of care discussed with patient in length and he verbalized understanding and agreed with it. Disposition Plan: TBD Consults called: GI and nephrology Dr. Marval Regal  admission status: Inpatient   Mckinley Jewel MD [facility] Pager [phone]  If 7PM-7AM, please contact night-coverage [email or web address] Password [email or web address]  09/11/2019, 4:40 PM

## 2019-09-11 NOTE — ED Notes (Signed)
Spoke to On-call MD concerning HTN, new orders provided (see MAR)

## 2019-09-11 NOTE — ED Triage Notes (Signed)
Per [facility] pt coming from home called out for a fall. Patient states she fell cause she was feeling dizzy, possible brief LOC. Patient hit back of head and has hematoma. New dialysis patient and is due today. Hx of mastectomy so restricted left arm. Prepping right arm for dialysis. Patient also adds that she has dark red blood in her stool that has been going on for a few months. Denies any dizziness in triage.

## 2019-09-11 NOTE — ED Notes (Signed)
Ordered dinner tray.  

## 2019-09-11 NOTE — Telephone Encounter (Signed)
Husband reported the patient fell appkroximately 45 minutes ago. She became dizzy while walking, fell and hit the back side of her head on the dresser, he thinks. Large knot to the back left side of her head. No laceration/bleeding reported.Patient reporting she was experiencing dizziness and fell. She sounded weak but was oriented to person place and event.  Instructed to place icepack to the area and call 911 to be evaluated for possible concussion. Also reported she noticed bright red blood in her stool this morning. Patient will phone back for follow-up on blood in stool. Reason for Disposition . [1] ACUTE NEURO SYMPTOM AND [2] present now  (DEFINITION: difficult to awaken OR confused thinking and talking OR slurred speech OR weakness of arms OR unsteady walking)  Answer Assessment - Initial Assessment Questions 1. MECHANISM: "How did the injury happen?" For falls, ask: "What height did you fall from?" and "What surface did you fall against?"      Fell from standing due to dizziness. Most likely hit the back of her head on a dresser. 2. ONSET: "When did the injury happen?" (Minutes or hours ago)      One hour ago 3. NEUROLOGIC SYMPTOMS: "Was there any loss of consciousness?" "Are there any other neurological symptoms?"      No 4. MENTAL STATUS: "Does the person know who he is, who you are, and where he is?"      Yes 5. LOCATION: "What part of the head was hit?"      Back left side 6. SCALP APPEARANCE: "What does the scalp look like? Is it bleeding now?" If so, ask: "Is it difficult to stop?"      Quarter-sized knot, no bleeding or laceration reported.7. SIZE: For cuts, bruises, or swelling, ask: "How large is it?" (e.g., inches or centimeters)       8. PAIN: "Is there any pain?" If so, ask: "How bad is it?"  (e.g., Scale 1-10; or mild, moderate, severe)     Yes, "a lot" 9. TETANUS: For any breaks in the skin, ask: "When was the last tetanus booster?"     Unsure 10. OTHER SYMPTOMS: "Do you  have any other symptoms?" (e.g., neck pain, vomiting)      No 11. PREGNANCY: "Is there any chance you are pregnant?" "When was your last menstrual period?"       Na  Protocols used: HEAD INJURY-A-AH

## 2019-09-11 NOTE — Telephone Encounter (Signed)
Noted  

## 2019-09-11 NOTE — ED Notes (Addendum)
SBP increase to 231 noted, on-call [facility] Floor Coverage paged.

## 2019-09-11 NOTE — Telephone Encounter (Signed)
FYI

## 2019-09-11 NOTE — Consult Note (Signed)
[facility] Consult: 9:57 AM 09/12/2019  LOS: 1 day    Referring Provider: Dr Stark Jock in ED  Primary Care Physician:  Colon Branch, MD ileal Crohn's disease Primary Gastroenterologist:  Dr. Henrene Pastor.       Reason for Consultation: Blood in the stool.   HPI: Samantha Mckenzie is a 83 y.o. female.  Past history breast cancer with chronic left UEs lymphedema.  ESRD on HD starting in 07/2019 via right subclavian catheter.  Right LUE fistula has not matured enough for use yet.  Hypertension.  COPD diastolic heart failure.  Anemia.  Thrombocytopenia. GERD.  01/2015 EGD was normal.   03/2019 CTAP suggested enteritis, microperforation not excluded.  Patient improved with antibiotics.  05/30/2019 CTAP wo contrast showed long segment of wall thickening with associated inflammatory changes in the terminal ileum suggesting terminal ileitis.  Focal caliber change approximately to this area with mild distention of the more proximal ileum raising possibility of stricture.  06/09/2019 fecal calprotectin elevated at 1121.  Has not had assay of sed rate or CRP. 06/06/2019 colonoscopy.  Stricture at the terminal ileum, pathology: Ileal mucosa with no significant pathologic findings.  No active inflammation or other abnormalities.  However biopsies were not felt to be representative and there was a high suspicion for Crohn's disease.  Left-sided diverticulosis.  During admission in August she was treated with IV Solu-Medrol and discharged on 10-week prednisone taper. 07/12/2019 office follow-up with Dr. Henrene Pastor at which time she was feeling better, abdominal pain and appetite significantly improved with some few pounds of weight gain, decreased diarrhea.  Imodium was helping with the diarrhea.  Still complained of weakness and tolerating steroids. Dr.  Henrene Pastor outlined prednisone taper as follows:  1.  Prednisone 40 mg daily during the month of October 2.  Prednisone 30 mg daily from November 1 through November 15 3.  Prednisone 20 mg daily from November 16 through November 30 Starting December 1 continue with prednisone 20 mg daily until further notice. Pt has appointment with Dr. Henrene Pastor set up for 2 PM today, 12/2.  Other home meds include daily oral vitamin B12.  Weekly Aranesp.  Daily Protonix  Patient seen during admission of 10/22 - 08/10/19 for anemia and reported black stools.  Dr Ardis Hughs suggested bid PPI, complete prednisone taper.  EGD not pursued.  It was during this admission that she started hemodialysis.  On 09/11/2019 she became dizzy and fell striking her head.  Not sure if she lost consciousness.  There is a bump to the left of the occiput. Dizziness was acute, no previous occurrences.   While in the ED she was hypertensive with elevated systolic and normal to low diastolic readings, as high as 229/47.  Heart rate in the 50s -60s. Hgb 8.5 (was 8.8 on 08/10/19).   MCV is 111. WBCs 9   2 to 4 months diarrheal stools admixed w red blood.  Occurring multiple times daily w incontinence but w imodium down to 2 x daily but still mixed with streaks of blood.      On  ED physicians DRE stool was grossly bloody/maroon-colored, tested FOBT +. Pt has no abdominal or rectal pain.  Still has poor appetite but not nausea or vomiting.     Past Medical History:  Diagnosis Date  . Allergic rhinitis   . Anemia   . Breast CA ([place])    surgery, chemo, XRT; had peripheral neuropathy (imbalance at times) after chemo  . Cellulitis    Left arm, recurrent   . Cellulitis LEFT arm recurrent 12/08/2011  . Chronic renal disease, stage IV (HCC)    Dr. Moshe Cipro  . Depression   . Fatigue   . GERD (gastroesophageal reflux disease)    gastritis, EGD 02/2007  . Gout   . Hyperlipidemia   . Hypertension   . Macular degeneration 2011  . Normal  cardiac stress test 12/2014  . Osteoarthritis   . Renal insufficiency     Past Surgical History:  Procedure Laterality Date  . AV FISTULA PLACEMENT Right 08/10/2019   Procedure: ARTERIOVENOUS (AV) FISTULA CREATION VERSUS GRAFT PLACEMENT;  Surgeon: Angelia Mould, MD;  Location: [facility];  Service: Vascular;  Laterality: Right;  . BIOPSY  06/06/2019   Procedure: BIOPSY;  Surgeon: Ladene Artist, MD;  Location: [facility];  Service: Endoscopy;;  . cataracts bilaterally  1 -2012  . COLONOSCOPY WITH PROPOFOL N/A 06/06/2019   Procedure: COLONOSCOPY WITH PROPOFOL;  Surgeon: Ladene Artist, MD;  Location: [facility];  Service: Endoscopy;  Laterality: N/A;  . IR FLUORO GUIDE CV LINE RIGHT  08/03/2019  . IR US GUIDE VASC ACCESS RIGHT  08/03/2019  . LUNG BIOPSY  1999   neg  . MASTECTOMY Left 1999   and lymphnodes     Prior to Admission medications   Medication Sig Start Date End Date Taking? Authorizing Provider  acetaminophen (TYLENOL) 325 MG tablet Take 2 tablets (650 mg total) by mouth every 6 (six) hours as needed for mild pain, fever or headache (or Fever >/= 101). 04/12/19   Emokpae, Courage, MD  allopurinol (ZYLOPRIM) 100 MG tablet Take 1 tablet (100 mg total) by mouth daily. 06/29/19   Granville Lewis C, PA-C  amLODipine (NORVASC) 10 MG tablet Take 1 tablet (10 mg total) by mouth daily. 07/24/19   Colon Branch, MD  atorvastatin (LIPITOR) 20 MG tablet Take 1 tablet (20 mg total) by mouth daily. 06/29/19   Wille Celeste, PA-C  Calcium Carb-Cholecalciferol (CALTRATE 600+D3 PO) Take 1 tablet by mouth 2 (two) times daily.     [provider]  carvedilol (COREG) 6.25 MG tablet Take 1 tablet (6.25 mg total) by mouth 2 (two) times daily with a meal. 09/10/19 10/10/19  Colon Branch, MD  Cholecalciferol (VITAMIN D3) 1000 UNITS CAPS Take 1 capsule by mouth daily.     [provider]  Darbepoetin Alfa (ARANESP) 100 MCG/0.5ML SOSY injection Inject 0.5 mLs (100 mcg total) into the  vein every Saturday with hemodialysis. 08/11/19   Cherylann Ratel A, DO  dicyclomine (BENTYL) 10 MG capsule Take 2 capsules (20 mg total) by mouth 4 (four) times daily -  before meals and at bedtime. 06/29/19   Wille Celeste, PA-C  dronabinol (MARINOL) 2.5 MG capsule Take 1 capsule (2.5 mg total) by mouth 2 (two) times daily before a meal. 09/10/19   Paz, Alda Berthold, MD  escitalopram (LEXAPRO) 10 MG tablet Take 2 tablets (20 mg total) by mouth daily. 07/06/19   Colon Branch, MD  hydrALAZINE (APRESOLINE) 10 MG tablet Take 1 tablet (10  mg total) by mouth every 8 (eight) hours. 08/10/19 09/09/19  Cherylann Ratel A, DO  loperamide (IMODIUM A-D) 2 MG tablet Take 2 mg by mouth 4 (four) times daily as needed for diarrhea or loose stools.    [provider]  Multiple Vitamins-Minerals (CENTRUM SILVER PO) Take 1 tablet by mouth daily.     [provider]  ondansetron (ZOFRAN) 4 MG tablet Take 1 tablet (4 mg total) by mouth 4 (four) times daily. 09/10/19   Colon Branch, MD  pantoprazole (PROTONIX) 40 MG tablet Take 1 tablet (40 mg total) by mouth daily before breakfast. 07/24/19   Colon Branch, MD  predniSONE (DELTASONE) 10 MG tablet Take 1 tablets a day (67m) for one month; then take 1.5 tablets a day (369m for 2 weeks; then take 1 tablet a day (2063muntil further notice 07/12/19   PerIrene ShipperD  predniSONE (DELTASONE) 20 MG tablet Prednisone 40 mg daily during the month of October 2.  Prednisone 30 mg daily from November 1 through November 15 3.  Prednisone 20 mg daily from November 16 until further notice 07/23/19   PerIrene ShipperD  vitamin B-12 (CYANOCOBALAMIN) 1000 MCG tablet Take 1,000 mcg by mouth daily.      [provider]  vitamin C (ASCORBIC ACID) 500 MG tablet Take 500 mg by mouth daily.    [provider]    Scheduled Meds:  Infusions:  PRN Meds:    Allergies as of 09/11/2019 - Review Complete 09/11/2019  Allergen Reaction Noted  . Codeine Nausea And  Vomiting 07/07/2007  . Morphine Nausea And Vomiting 07/07/2007  . Oxycodone-acetaminophen Nausea And Vomiting 07/07/2007  . Sulfonamide derivatives Nausea And Vomiting 07/07/2007  . Tramadol hcl Nausea And Vomiting 07/07/2007    Family History  Problem Relation Age of Onset  . Breast cancer Other        ? of   . Heart attack Father 86 45 Diabetes Sister        2  . Hypertension Sister   . Colon cancer Neg Hx   . Stomach cancer Neg Hx     Social History   Socioeconomic History  . Marital status: Married    Spouse name: Not on file  . Number of children: 2  . Years of education: Not on file  . Highest education level: Not on file  Occupational History  . Occupation: retired     EmpFish farm managerETIRED  Social Needs  . Financial resource strain: Not on file  . Food insecurity    Worry: Not on file    Inability: Not on file  . Transportation needs    Medical: No    Non-medical: No  Tobacco Use  . Smoking status: Never Smoker  . Smokeless tobacco: Never Used  Substance and Sexual Activity  . Alcohol use: Not Currently    Alcohol/week: 0.0 standard drinks    Comment: rarely  . Drug use: No  . Sexual activity: Not Currently  Lifestyle  . Physical activity    Days per week: Not on file    Minutes per session: Not on file  . Stress: Not on file  Relationships  . Social conHerbalist phone: Not on file    Gets together: Not on file    Attends religious service: Not on file    Active member of club or organization: Not on file    Attends meetings of clubs or organizations: Not  on file    Relationship status: Not on file  . Intimate partner violence    Fear of current or ex partner: Not on file    Emotionally abused: Not on file    Physically abused: Not on file    Forced sexual activity: Not on file  Other Topics Concern  . Not on file  Social History Narrative   Lives w/ husband , 2 children in [place], 2 [place]: Constitutional:  Feels tired, weak. ENT:  No nose bleeds Pulm: Denies shortness of breath or cough. CV:  No palpitations, no LE edema.  No angina GU:   Anuric.   GI: See HPI.  No heartburn, no dysphagia. Heme: Other than the bloody stool, no other unusual bleeding or bruising. Transfusions:  2 units PRBCs in October 2020.   1 PRBC on 05/31/19 Neuro:  No headaches, no peripheral tingling or numbness Derm:  No itching, no rash or sores.  Endocrine:  No sweats or chills.  No polyuria or dysuria Immunization: reviewed Travel:  None beyond local counties in last few months.    PHYSICAL EXAM: Vital signs in last 24 hours: Vitals:   09/12/19 0815 09/12/19 0845  BP: (!) 189/52 (!) 155/47  Pulse: 76 82  Resp:    Temp:    SpO2:     Wt Readings from Last 3 Encounters:  09/12/19 56.6 kg  08/09/19 118 kg  08/02/19 60.6 kg    General: Pale, ashen colored, chronically ill-appearing, comfortable WF. Head: No facial asymmetry or swelling.  3 cm contusion superior left occiput. Eyes:  No scleral icterus.  Conjunctiva pale. Ears: Not HOH. Nose: No discharge or congestion Mouth: Pink, moist, clear oropharynx.  Tongue midline. Neck: No JVD, no masses, no thyromegaly.  Right IJ line/dialysis catheter in place. Lungs: Clear bilaterally.  No labored breathing or cough Heart: RRR.  No MRG.  S1, S2 present Abdomen: Soft without tenderness.  Active bowel sounds.  No distention.  No HSM, masses, bruits, organomegaly..   Rectal: Did not repeat exam done by Dr. Stark Jock in ED yesterday at which time she had red blood Musc/Skeltl: No gross joint deformities or swelling. Extremities: No CCE.  Right AV graft in place. Neurologic: Alert.  Appropriate.  Oriented x3. Skin: Ashen colored skin, no suspicious rashes, sores, lesions. Tattoos: None observed Nodes: No cervical adenopathy Psych: Calm, cooperative, pleasant.  Intake/Output from previous day: No intake/output data recorded. Intake/Output this shift: No  intake/output data recorded.  LAB RESULTS: Recent Labs    09/11/19 1222 09/12/19 0812  WBC 9.0 7.4  HGB 8.5* 7.6*  HCT 28.6* 25.9*  PLT 155 171   BMET Lab Results  Component Value Date   NA 133 (L) 09/12/2019   NA 133 (L) 09/11/2019   NA 136 08/10/2019   K 3.2 (L) 09/12/2019   K 3.2 (L) 09/11/2019   K 4.0 08/10/2019   CL 97 (L) 09/12/2019   CL 94 (L) 09/11/2019   CL 103 08/10/2019   CO2 24 09/12/2019   CO2 19 (L) 09/11/2019   CO2 22 08/10/2019   GLUCOSE 104 (H) 09/12/2019   GLUCOSE 97 09/11/2019   GLUCOSE 152 (H) 08/10/2019   BUN 45 (H) 09/12/2019   BUN 43 (H) 09/11/2019   BUN 49 (H) 08/10/2019   CREATININE 6.76 (H) 09/12/2019   CREATININE 6.43 (H) 09/11/2019   CREATININE 3.70 (H) 08/10/2019   CALCIUM 8.1 (L) 09/12/2019  CALCIUM 8.8 (L) 09/11/2019   CALCIUM 6.9 (L) 08/10/2019   LFT Recent Labs    09/12/19 0812  PROT 4.1*  ALBUMIN 1.9*  AST 13*  ALT 21  ALKPHOS 43  BILITOT 0.4   PT/INR Lab Results  Component Value Date   INR 1.0 09/11/2019   INR 1.0 08/07/2019   INR 1.0 08/03/2019   Hepatitis Panel No results for input(s): HEPBSAG, HCVAB, HEPAIGM, HEPBIGM in the last 72 hours. C-Diff No components found for: CDIFF Lipase     Component Value Date/Time   LIPASE 53 (H) 05/31/2019 0717    Drugs of Abuse     Component Value Date/Time   LABOPIA NONE DETECTED 05/16/2019 [place] DETECTED 05/16/2019 1835   LABBENZ NONE DETECTED 05/16/2019 1835   AMPHETMU NONE DETECTED 05/16/2019 1835   THCU NONE DETECTED 05/16/2019 1835   LABBARB NONE DETECTED 05/16/2019 1835     RADIOLOGY STUDIES: Dg Chest 2 View  Result Date: 09/11/2019 CLINICAL DATA:  Weakness. EXAM: CHEST - 2 VIEW COMPARISON:  May 30, 2019. FINDINGS: The heart size and mediastinal contours are within normal limits. Interval placement of right internal jugular dialysis catheter with distal tip in expected position of right atrium. No pneumothorax or pleural effusion is  noted. Mild bibasilar subsegmental atelectasis is noted. The visualized skeletal structures are unremarkable. IMPRESSION: Interval placement of right internal jugular dialysis catheter with distal tip in expected position of right atrium. Mild bibasilar subsegmental atelectasis is noted. Electronically Signed   By: Marijo Conception M.D.   On: 09/11/2019 15:20   Ct Head Wo Contrast  Result Date: 09/11/2019 CLINICAL DATA:  Patient became dizzy and fell. Possible brief loss of consciousness. Patient hit the back of her head. EXAM: CT HEAD WITHOUT CONTRAST TECHNIQUE: Contiguous axial images were obtained from the base of the skull through the vertex without intravenous contrast. COMPARISON:  05/30/2019 FINDINGS: Brain: No evidence of acute infarction, hemorrhage, hydrocephalus, extra-axial collection or mass lesion/mass effect. There is mild ventricular sulcal enlargement reflecting age related volume loss. Mild periventricular white matter hypoattenuation is also noted consistent with chronic microvascular ischemic change. Vascular: No hyperdense vessel or unexpected calcification. Skull: Normal. Negative for fracture or focal lesion. Sinuses/Orbits: Globes and orbits are unremarkable. The visualized sinuses and mastoid air cells are clear. Other: Small left parietal scalp hematoma. IMPRESSION: 1. No acute intracranial abnormalities. 2. Age related volume loss. Mild chronic microvascular ischemic change. 3. Small left parietal scalp hematoma. Electronically Signed   By: Lajean Manes M.D.   On: 09/11/2019 15:41     IMPRESSION:   *   Bloody stools.  Diagnosed with Crohn's disease of terminal ileum about 4 months ago.  Currently being managed with prednisone taper.  Even with full dose prednisone she has been having bloody stools but diarrhea is much better controlled with Imodium.  *   Acute on chronic macrocytic anemia.   Last had anemia studies in June 2020.  *     Dizziness, weakness, fall with minor  head trauma..  Head CT showing left-sided scalp hematoma and chronic microvascular ischemic changes but nothing acute or worrisome.  *    ESRD, new to hemodialysis as of 07/2019.   PLAN:     *   Continue prednisone 20 mg/day, Protonix 40/day, Imodium prn. Ok to  ?initiate immunomodulators, biologic therapy.  Dr. Henrene Pastor did not mention this in his note of 10/1. ? Repeat fecal calprotectin?    [name]  09/12/2019, 9:57 AM  Phone [phone]

## 2019-09-11 NOTE — Telephone Encounter (Signed)
Patient husband called back he wanted to let someone know wife got picked up by [facility] about 11/2 hours ago and was at ed.

## 2019-09-11 NOTE — Consult Note (Signed)
Reason for Consult: To manage dialysis and dialysis related needs Referring Physician: Dr. Verne Spurr is an 83 y.o. female.   HPI: Samantha Mckenzie is an 61F with ESRD on HD TTS recently started, breast cancer, HTN, HLD, gout, recent Dx of Crohn's who is now seen in consultation at the request of Dr. Doristine Bosworth for evaluation and management of ESRD.  Samantha Mckenzie was in her usual state of health until this AM when she fell and hit the back of her head.  She became dizzy beforehand.  Doesn't think she lost consciousness but not sure.  Didn't get to dialysis today. In this setting we are asked to see.  In ED CT head negative for acute process. K 3.2, BUN 43, Cr 6.43, Ca 8.8.  hgb 8.5, up from latest OP value of 7.5 11/25.  Trops flat, CXR neg, FOBT positive, COVID pending.    Dialyzes at [facility] TTS 4 hrs EDW 51 kg 2K/2.25 Ca bath F180 BFR 400/ DFR 800  Access R IJ TDC, R AVG (ready for cannulation this week) No profile, no Na modeling Heparin- none venofer 50 q week Mircera 225 mcg q 2 weeks, last given 11/25    Past Medical History:  Diagnosis Date  . Allergic rhinitis   . Anemia   . Breast CA ([place])    surgery, chemo, XRT; had peripheral neuropathy (imbalance at times) after chemo  . Cellulitis    Left arm, recurrent   . Cellulitis LEFT arm recurrent 12/08/2011  . Chronic renal disease, stage IV (HCC)    Dr. Moshe Cipro  . Depression   . Fatigue   . GERD (gastroesophageal reflux disease)    gastritis, EGD 02/2007  . Gout   . Hyperlipidemia   . Hypertension   . Macular degeneration 2011  . Normal cardiac stress test 12/2014  . Osteoarthritis   . Renal insufficiency     Past Surgical History:  Procedure Laterality Date  . AV FISTULA PLACEMENT Right 08/10/2019   Procedure: ARTERIOVENOUS (AV) FISTULA CREATION VERSUS GRAFT PLACEMENT;  Surgeon: Angelia Mould, MD;  Location: [place];  Service: Vascular;  Laterality: Right;  . BIOPSY  06/06/2019   Procedure: BIOPSY;  Surgeon: Ladene Artist, MD;  Location: [facility];  Service: Endoscopy;;  . cataracts bilaterally  1 -2012  . COLONOSCOPY WITH PROPOFOL N/A 06/06/2019   Procedure: COLONOSCOPY WITH PROPOFOL;  Surgeon: Ladene Artist, MD;  Location: [facility];  Service: Endoscopy;  Laterality: N/A;  . IR FLUORO GUIDE CV LINE RIGHT  08/03/2019  . IR US GUIDE VASC ACCESS RIGHT  08/03/2019  . LUNG BIOPSY  1999   neg  . MASTECTOMY Left 1999   and lymphnodes     Family History  Problem Relation Age of Onset  . Breast cancer Other        ? of   . Heart attack Father 10  . Diabetes Sister        2  . Hypertension Sister   . Colon cancer Neg Hx   . Stomach cancer Neg Hx     Social History:  reports that she has never smoked. She has never used smokeless tobacco. She reports previous alcohol use. She reports that she does not use drugs.  Allergies:  Allergies  Allergen Reactions  . Codeine Nausea And Vomiting  . Morphine Nausea And Vomiting  . Oxycodone-Acetaminophen Nausea And Vomiting  . Sulfonamide Derivatives Nausea And Vomiting  . Tramadol Hcl Nausea And Vomiting  Medications: Scheduled:   Results for orders placed or performed during the hospital encounter of 09/11/19 (from the past 48 hour(s))  POC occult blood, ED Provider will collect     Status: Abnormal   Collection Time: 09/11/19 12:09 PM  Result Value Ref Range   Fecal Occult Bld POSITIVE (A) NEGATIVE  Basic metabolic panel     Status: Abnormal   Collection Time: 09/11/19 12:22 PM  Result Value Ref Range   Sodium 133 (L) 135 - 145 mmol/L   Potassium 3.2 (L) 3.5 - 5.1 mmol/L   Chloride 94 (L) 98 - 111 mmol/L   CO2 19 (L) 22 - 32 mmol/L   Glucose, Bld 97 70 - 99 mg/dL   BUN 43 (H) 8 - 23 mg/dL   Creatinine, Ser 6.43 (H) 0.44 - 1.00 mg/dL   Calcium 8.8 (L) 8.9 - 10.3 mg/dL   GFR calc non Af Amer 6 (L) >60 mL/min   GFR calc Af Amer 6 (L) >60 mL/min   Anion gap 20 (H) 5 - 15    Comment: Performed at [facility], [address], [place], [place] [address]  CBC     Status: Abnormal   Collection Time: 09/11/19 12:22 PM  Result Value Ref Range   WBC 9.0 4.0 - 10.5 K/uL   RBC 2.56 (L) 3.87 - 5.11 MIL/uL   Hemoglobin 8.5 (L) 12.0 - 15.0 g/dL   HCT 28.6 (L) 36.0 - 46.0 %   MCV 111.7 (H) 80.0 - 100.0 fL   MCH 33.2 26.0 - 34.0 pg   MCHC 29.7 (L) 30.0 - 36.0 g/dL   RDW 19.8 (H) 11.5 - 15.5 %   Platelets 155 150 - 400 K/uL   nRBC 2.2 (H) 0.0 - 0.2 %    Comment: Performed at [facility], [place] [address] [place], [place] [address]  Type and screen     Status: None   Collection Time: 09/11/19  2:48 PM  Result Value Ref Range   ABO/RH(D) A POS    Antibody Screen NEG    Sample Expiration      09/14/2019,2359 Performed at [facility], [place] [address], [place], [place] [address]   Magnesium     Status: None   Collection Time: 09/11/19  5:22 PM  Result Value Ref Range   Magnesium 1.9 1.7 - 2.4 mg/dL    Comment: Performed at [facility], [place] [address] [place], [place] [address]  Phosphorus     Status: Abnormal   Collection Time: 09/11/19  5:22 PM  Result Value Ref Range   Phosphorus 2.2 (L) 2.5 - 4.6 mg/dL    Comment: Performed at [place] [address], [place], [place] [address]  Protime-INR     Status: None   Collection Time: 09/11/19  5:22 PM  Result Value Ref Range   Prothrombin Time 13.2 11.4 - 15.2 seconds   INR 1.0 0.8 - 1.2    Comment: (NOTE) INR goal varies based on device and disease states. Performed at [facility], [place] [address], [place], [place] [address]   APTT     Status: Abnormal   Collection Time: 09/11/19  5:22 PM  Result Value Ref Range   aPTT 21 (L) 24 - 36 seconds    Comment: Performed at [place] [address], [place], [place] [address]  Troponin I (High Sensitivity)     Status: Abnormal   Collection  Time: 09/11/19  5:22 PM  Result Value Ref Range   Troponin I (High Sensitivity) 37 (H) <18 ng/L    Comment: (NOTE) Elevated high  sensitivity troponin I (hsTnI) values and significant  changes across serial measurements may suggest ACS but many other  chronic and acute conditions are known to elevate hsTnI results.  Refer to the "Links" section for chest pain algorithms and additional  guidance. Performed at [facility], [place] [address], [place], [place] [address]   Lactic acid, plasma     Status: None   Collection Time: 09/11/19  5:22 PM  Result Value Ref Range   Lactic Acid, Venous 1.1 0.5 - 1.9 mmol/L    Comment: Performed at [place] [address] [place], [place] [address]  Vitamin B12     Status: Abnormal   Collection Time: 09/11/19  5:22 PM  Result Value Ref Range   Vitamin B-12 >7,500 (H) 180 - 914 pg/mL    Comment: RESULT CONFIRMED BY AUTOMATED DILUTION Performed at [facility], [place] [address] [place],  [address]   TSH     Status: None   Collection Time: 09/11/19  5:22 PM  Result Value Ref Range   TSH 1.022 0.350 - 4.500 uIU/mL    Comment: Performed by a 3rd Generation assay with a functional sensitivity of <=0.01 uIU/mL. Performed at [facility], [place] [address], [place],  [address]     Dg Chest 2 View  Result Date: 09/11/2019 CLINICAL DATA:  Weakness. EXAM: CHEST - 2 VIEW COMPARISON:  May 30, 2019. FINDINGS: The heart size and mediastinal contours are within normal limits. Interval placement of right internal jugular dialysis catheter with distal tip in expected position of right atrium. No pneumothorax or pleural effusion is noted. Mild bibasilar subsegmental atelectasis is noted. The visualized skeletal structures are unremarkable. IMPRESSION: Interval placement of right internal jugular dialysis catheter with distal tip in expected position of right atrium. Mild bibasilar subsegmental atelectasis is noted. Electronically Signed   By: Marijo Conception M.D.   On: 09/11/2019 15:20   Ct Head Wo Contrast  Result Date: 09/11/2019 CLINICAL DATA:  Patient  became dizzy and fell. Possible brief loss of consciousness. Patient hit the back of her head. EXAM: CT HEAD WITHOUT CONTRAST TECHNIQUE: Contiguous axial images were obtained from the base of the skull through the vertex without intravenous contrast. COMPARISON:  05/30/2019 FINDINGS: Brain: No evidence of acute infarction, hemorrhage, hydrocephalus, extra-axial collection or mass lesion/mass effect. There is mild ventricular sulcal enlargement reflecting age related volume loss. Mild periventricular white matter hypoattenuation is also noted consistent with chronic microvascular ischemic change. Vascular: No hyperdense vessel or unexpected calcification. Skull: Normal. Negative for fracture or focal lesion. Sinuses/Orbits: Globes and orbits are unremarkable. The visualized sinuses and mastoid air cells are clear. Other: Small left parietal scalp hematoma. IMPRESSION: 1. No acute intracranial abnormalities. 2. Age related volume loss. Mild chronic microvascular ischemic change. 3. Small left parietal scalp hematoma. Electronically Signed   By: Lajean Manes M.D.   On: 09/11/2019 15:41    ROS: All other systems reviewed and are negative except as per HPI Blood pressure (!) 229/47, pulse 65, temperature 98.4 F (36.9 C), temperature source Oral, resp. rate 16, SpO2 100 %. .  GEN: NAD, lying in bed, dizzy with any movement esp turning to R HEENT EOMI PERRL wearing mask NECK no JVD PULM normal WOB, clear anteriorly, couldn't really turn over to examine L  CV RRR soft systolic murmur no r/g ABD soft, nondistended, mildly diffusely tender EXT 2+ LE pretibial edema NEURO AAO x3, dizziness when turning to R, doesn't seem as severe on L (I am standing on her L) SKIN thinned MSK no effusions ACCESS + R IJ TDC, R AVG + T/B  Assessment/Plan: 1 Fall: in setting of GIB, CT head negative.  hgb is better than most recent OP but  may dilute out.  Dizziness when turning to R may have element of BPPV to it, monitor,  per primary 2.  GI bleed and Crohn's: GI consulted, appreciate assistance.  On Max ESA as OP, last given 11/25 3 ESRD: TTS SW [facility], missed today, will get on sched for tomorrow  4 Hypertension: ? Accuracy of Bps, getting them taken on leg 5. Metabolic Bone Disease: no binders or VDRA 6.  Dispo: pending  Madelon Lips 09/11/2019, 7:58 PM

## 2019-09-11 NOTE — ED Notes (Addendum)
Pt states she does not make urine.

## 2019-09-11 NOTE — ED Notes (Signed)
MD Delo stated pt can have IV in right arm.

## 2019-09-12 ENCOUNTER — Ambulatory Visit: Payer: Medicare Other | Admitting: Internal Medicine

## 2019-09-12 ENCOUNTER — Telehealth: Payer: Self-pay | Admitting: *Deleted

## 2019-09-12 DIAGNOSIS — R55 Syncope and collapse: Secondary | ICD-10-CM

## 2019-09-12 DIAGNOSIS — K50011 Crohn's disease of small intestine with rectal bleeding: Principal | ICD-10-CM

## 2019-09-12 HISTORY — DX: Syncope and collapse: R55

## 2019-09-12 LAB — IRON AND TIBC
Iron: 17 ug/dL — ABNORMAL LOW (ref 28–170)
Saturation Ratios: 11 % (ref 10.4–31.8)
TIBC: 153 ug/dL — ABNORMAL LOW (ref 250–450)
UIBC: 136 ug/dL

## 2019-09-12 LAB — COMPREHENSIVE METABOLIC PANEL
ALT: 21 U/L (ref 0–44)
AST: 13 U/L — ABNORMAL LOW (ref 15–41)
Albumin: 1.9 g/dL — ABNORMAL LOW (ref 3.5–5.0)
Alkaline Phosphatase: 43 U/L (ref 38–126)
Anion gap: 12 (ref 5–15)
BUN: 45 mg/dL — ABNORMAL HIGH (ref 8–23)
CO2: 24 mmol/L (ref 22–32)
Calcium: 8.1 mg/dL — ABNORMAL LOW (ref 8.9–10.3)
Chloride: 97 mmol/L — ABNORMAL LOW (ref 98–111)
Creatinine, Ser: 6.76 mg/dL — ABNORMAL HIGH (ref 0.44–1.00)
GFR calc Af Amer: 6 mL/min — ABNORMAL LOW (ref >60)
GFR calc non Af Amer: 5 mL/min — ABNORMAL LOW (ref >60)
Glucose, Bld: 104 mg/dL — ABNORMAL HIGH (ref 70–99)
Potassium: 3.2 mmol/L — ABNORMAL LOW (ref 3.5–5.1)
Sodium: 133 mmol/L — ABNORMAL LOW (ref 135–145)
Total Bilirubin: 0.4 mg/dL (ref 0.3–1.2)
Total Protein: 4.1 g/dL — ABNORMAL LOW (ref 6.5–8.1)

## 2019-09-12 LAB — CBC
HCT: 25.9 % — ABNORMAL LOW (ref 36.0–46.0)
Hemoglobin: 7.6 g/dL — ABNORMAL LOW (ref 12.0–15.0)
MCH: 31.9 pg (ref 26.0–34.0)
MCHC: 29.3 g/dL — ABNORMAL LOW (ref 30.0–36.0)
MCV: 108.8 fL — ABNORMAL HIGH (ref 80.0–100.0)
Platelets: 171 10*3/uL (ref 150–400)
RBC: 2.38 MIL/uL — ABNORMAL LOW (ref 3.87–5.11)
RDW: 19.9 % — ABNORMAL HIGH (ref 11.5–15.5)
WBC: 7.4 10*3/uL (ref 4.0–10.5)
nRBC: 1.8 % — ABNORMAL HIGH (ref 0.0–0.2)

## 2019-09-12 LAB — RETICULOCYTES
Immature Retic Fract: 39.6 % — ABNORMAL HIGH (ref 2.3–15.9)
RBC.: 2.38 MIL/uL — ABNORMAL LOW (ref 3.87–5.11)
Retic Count, Absolute: 219 10*3/uL — ABNORMAL HIGH (ref 19.0–186.0)
Retic Ct Pct: 9.2 % — ABNORMAL HIGH (ref 0.4–3.1)

## 2019-09-12 LAB — PREPARE RBC (CROSSMATCH)

## 2019-09-12 LAB — HEPATITIS B SURFACE ANTIGEN: Hepatitis B Surface Ag: NONREACTIVE

## 2019-09-12 LAB — PHOSPHORUS: Phosphorus: 2.9 mg/dL (ref 2.5–4.6)

## 2019-09-12 LAB — FERRITIN: Ferritin: 361 ng/mL — ABNORMAL HIGH (ref 11–307)

## 2019-09-12 LAB — MRSA PCR SCREENING: MRSA by PCR: NEGATIVE

## 2019-09-12 MED ORDER — ALTEPLASE 2 MG IJ SOLR: 2.0000 mg | Freq: Once | INTRAMUSCULAR | Status: DC | PRN

## 2019-09-12 MED ORDER — HEPARIN SODIUM (PORCINE) 1000 UNIT/ML IJ SOLN
INTRAMUSCULAR | Status: AC
  Filled 2019-09-12: qty 3

## 2019-09-12 MED ORDER — HEPARIN SODIUM (PORCINE) 1000 UNIT/ML DIALYSIS
1000.0000 [IU] | INTRAMUSCULAR | Status: DC | PRN
  Administered 2019-09-12 – 2019-09-13 (×2): 3200 [IU] via INTRAVENOUS_CENTRAL

## 2019-09-12 MED ORDER — LIDOCAINE-PRILOCAINE 2.5-2.5 % EX CREA: 1.0000 "application " | TOPICAL_CREAM | CUTANEOUS | Status: DC | PRN

## 2019-09-12 MED ORDER — METHYLPREDNISOLONE SODIUM SUCC 125 MG IJ SOLR
60.0000 mg | INTRAMUSCULAR | Status: DC
  Administered 2019-09-14 – 2019-09-16 (×3): 60 mg via INTRAVENOUS
  Filled 2019-09-12 (×4): qty 2

## 2019-09-12 MED ORDER — SODIUM CHLORIDE 0.9% IV SOLUTION
Freq: Once | INTRAVENOUS | Status: AC
  Administered 2019-09-12: 16:00:00 via INTRAVENOUS

## 2019-09-12 MED ORDER — PANTOPRAZOLE SODIUM 40 MG PO TBEC
40.0000 mg | DELAYED_RELEASE_TABLET | Freq: Every day | ORAL | Status: DC
  Administered 2019-09-12 – 2019-09-18 (×5): 40 mg via ORAL
  Filled 2019-09-12 (×6): qty 1

## 2019-09-12 MED ORDER — SODIUM CHLORIDE 0.9 % IV SOLN: 100.0000 mL | INTRAVENOUS | Status: DC | PRN

## 2019-09-12 MED ORDER — PREDNISONE 20 MG PO TABS
20.0000 mg | ORAL_TABLET | Freq: Every day | ORAL | Status: DC
  Administered 2019-09-12: 16:00:00 20 mg via ORAL
  Filled 2019-09-12: qty 1

## 2019-09-12 MED ORDER — HYDRALAZINE HCL 10 MG PO TABS
10.0000 mg | ORAL_TABLET | Freq: Three times a day (TID) | ORAL | Status: DC
  Administered 2019-09-12 – 2019-09-18 (×15): 10 mg via ORAL
  Filled 2019-09-12 (×18): qty 1

## 2019-09-12 MED ORDER — PENTAFLUOROPROP-TETRAFLUOROETH EX AERO: 1.0000 "application " | INHALATION_SPRAY | CUTANEOUS | Status: DC | PRN

## 2019-09-12 MED ORDER — PREDNISONE 20 MG PO TABS: 20.0000 mg | ORAL_TABLET | Freq: Every day | ORAL | Status: DC

## 2019-09-12 MED ORDER — DRONABINOL 2.5 MG PO CAPS
2.5000 mg | ORAL_CAPSULE | Freq: Two times a day (BID) | ORAL | Status: DC
  Administered 2019-09-12 – 2019-09-18 (×10): 2.5 mg via ORAL
  Filled 2019-09-12 (×10): qty 1

## 2019-09-12 MED ORDER — ESCITALOPRAM OXALATE 20 MG PO TABS
20.0000 mg | ORAL_TABLET | Freq: Every day | ORAL | Status: DC
  Administered 2019-09-12 – 2019-09-18 (×7): 20 mg via ORAL
  Filled 2019-09-12 (×7): qty 1

## 2019-09-12 MED ORDER — CARVEDILOL 6.25 MG PO TABS
6.2500 mg | ORAL_TABLET | Freq: Two times a day (BID) | ORAL | Status: DC
  Administered 2019-09-12 – 2019-09-18 (×11): 6.25 mg via ORAL
  Filled 2019-09-12 (×11): qty 1

## 2019-09-12 MED ORDER — ATORVASTATIN CALCIUM 10 MG PO TABS
20.0000 mg | ORAL_TABLET | Freq: Every day | ORAL | Status: DC
  Administered 2019-09-12 – 2019-09-18 (×7): 20 mg via ORAL
  Filled 2019-09-12 (×7): qty 2

## 2019-09-12 MED ORDER — LIDOCAINE HCL (PF) 1 % IJ SOLN: 5.0000 mL | INTRAMUSCULAR | Status: DC | PRN

## 2019-09-12 MED ORDER — PANTOPRAZOLE SODIUM 40 MG PO TBEC: 40.0000 mg | DELAYED_RELEASE_TABLET | Freq: Every day | ORAL | Status: DC

## 2019-09-12 MED ORDER — LOPERAMIDE HCL 2 MG PO CAPS
2.0000 mg | ORAL_CAPSULE | Freq: Four times a day (QID) | ORAL | Status: DC | PRN
  Administered 2019-09-12 – 2019-09-15 (×3): 2 mg via ORAL
  Filled 2019-09-12 (×4): qty 1

## 2019-09-12 MED ORDER — ALLOPURINOL 100 MG PO TABS
100.0000 mg | ORAL_TABLET | Freq: Every day | ORAL | Status: DC
  Administered 2019-09-12 – 2019-09-18 (×7): 100 mg via ORAL
  Filled 2019-09-12 (×7): qty 1

## 2019-09-12 NOTE — Progress Notes (Signed)
PROGRESS NOTE    Samantha Mckenzie  RFF:[id number] DOB: 1936/05/02 DOA: 09/11/2019 PCP: Colon Branch, MD    Brief Narrative:   Samantha Mckenzie is a 83 y.o. female with medical history significant of depression, breast cancer status post left mastectomy/chemotherapy/radiation therapy, anemia of chronic disease, Crohn's ileitis, coronary artery disease, end-stage renal disease-recently started on hemodialysis, GERD presents to emergency department after a syncopal episode this morning.  Patient tells me that while she was getting ready for her dialysis appointment she fell on the floor and hit on her head.  She found herself on the floor after a few minutes.  She is uncertain as to whether she lost consciousness.  Denies headache, blurry vision, chest pain, shortness of breath, palpitation, seizures, urinary or bowel incontinence.  She tells me that she was dizzy this morning.  Reports bloody stools since 1 month.  Reports decreased appetite and weight loss however denies association with nausea, vomiting, epigastric pain, family history of colon cancer, over-the-counter use of NSAIDs or history of hemorrhoids.  She tells me that she had loose stool with bright red blood this morning.  She had a colonoscopy on 06/06/2019 which showed terminal ileum stricture and diverticulosis in left colon-biopsy came back suspicious for Crohn's disease and was treated with IV Solu-Medrol and discharged home on 10 weeks of tapering prednisone.  She started on hemodialysis approximately 1 month ago and has been compliant with her appointments however she missed her dialysis appointment this morning due to syncopal episode.  She lives with her husband, denies smoking, alcohol, illicit drug use.  ED Course: On arrival patient's blood pressure noted to be elevated, fecal occult positive, H&H 8.5/28.6, MCV 111.7, potassium: 3.2, chest x-ray negative for acute finding, CT head negative for stroke, COVID-19 pending.   EDP consulted GI for further evaluation and management.   Assessment & Plan:   Principal Problem:   Symptomatic anemia Active Problems:   HLD (hyperlipidemia)   Gout   Macrocytic anemia   Essential hypertension   GERD (gastroesophageal reflux disease)   Depression   Hypokalemia   ESRD (end stage renal disease) on dialysis (HCC)   GI bleed   Symptomatic anemia, macrocytic; multifactorial anemia of chronic renal disease coupled with active Crohn's colitis Recent diagnosis Crohn's inflammatory bowel disease Patient presenting with rest of fatigue and weakness.  Reports continued bloody bowel movements x1 month.  Recently diagnosed with Crohn's colitis from colonoscopy with biopsy 06/06/2019, currently on prednisone 20 mg p.o. daily.  Hemoglobin 8.5 with MCV of 111.7.  Anemia panel with iron 17, TIBC 153, ferritin 361, B12 > 7,500.  Etiology of anemia likely multifactorial with continued active Crohn's disease coupled with anemia of chronic disease from renal disease. --Gastroenterology following, appreciate assistance --Folate pending --on ESA weekly outpatient --Hgb 8.5-->7.6 --Transfuse 1 unit PRBC; repeat H&H following transfusion --Continue prednisone 20 mg p.o. daily --Awaiting further recommendations from gastroenterology  ESRD on HD Patient dialyzes at [facility] on a TTS schedule.  Has right IJ tunneled dialysis catheter in place.  Has right AV graft in place; not currently cannulized. --Nephrology following, appreciate assistance --HD today  Chronic diastolic congestive heart failure TTE 06/07/2017 with EF 5% with grade 1 diastolic dysfunction.  Chest x-ray on admission with no acute cardiopulmonary findings. --Restart home carvedilol 6.25 mg p.o. twice daily --Continue monitor daily weights and strict I's and O's --Volume management per HD  Essential hypertension BP 144/49 this morning --Restart home carvedilol 6.25 mg p.o. twice daily and  hydralazine 10 mg p.o. every 8  hours --Received HD this morning --Continue monitor blood pressure  Depression: Continue Lexapro  Gout: Continue allopurinol  HLD: Continue statin  GERD: Continue PPI  Moderate protein calorie malnutrition: --Continue Marinol for appetite stimulation --Nutrition consult for supplementation needs   DVT prophylaxis: SCDs Code Status: DNR Family Communication: Updated patient's son, Samantha Mckenzie who is present at bedside this afternoon Disposition Plan: Continue inpatient, awaiting further recommendations per GI   Consultants:   [place] gastroenterology  Nephrology  Palliative care  Procedures:   None  Antimicrobials:   None   Subjective: Patient seen and examined at bedside, resting comfortably.  Son present.  States continues with some dizziness and severe fatigue and weakness.  Hemoglobin has dropped from 8.5-7.6 this morning.  Discussed with patient and son regarding transfusion, they wish to proceed.  Patient and son also requests restart of her home medications which were being reconciled by pharmacy.  Awaiting further GI recommendations.  No other complaints or concerns at this time.  Denies headache, no chest pain, palpitations, no shortness of breath, no fever/chills/night sweats, no nausea/vomiting, no abdominal pain, no cough/congestion.  No acute events overnight per nursing staff.  Objective: Vitals:   09/12/19 1100 09/12/19 1130 09/12/19 1148 09/12/19 1310  BP: (!) 163/45 (!) 118/40 (!) 122/40 (!) 127/57  Pulse: 68 80 71 73  Resp:   12 15  Temp:   98.1 F (36.7 C) 97.7 F (36.5 C)  TempSrc:   Oral Oral  SpO2:   99% 96%  Weight:   52.8 kg   Height:        Intake/Output Summary (Last 24 hours) at 09/12/2019 1505 Last data filed at 09/12/2019 1504 Gross per 24 hour  Intake 160 ml  Output 2039 ml  Net -1879 ml   Filed Weights   09/12/19 0114 09/12/19 0730 09/12/19 1148  Weight: 56.6 kg 56.8 kg 52.8 kg    Examination:  General exam: Appears calm and  comfortable  Respiratory system: Clear to auscultation. Respiratory effort normal.  Oxygenating well on room air Cardiovascular system: S1 & S2 heard, RRR. No JVD, murmurs, rubs, gallops or clicks. No pedal edema.  Right IJ dialysis catheter noted, right AV fistula noted Gastrointestinal system: Abdomen is nondistended, soft and nontender. No organomegaly or masses felt. Normal bowel sounds heard. Central nervous system: Alert and oriented. No focal neurological deficits. Extremities: Symmetric 5 x 5 power. Skin: No rashes, lesions or ulcers Psychiatry: Judgement and insight appear normal. Mood & affect appropriate.     Data Reviewed: I have personally reviewed following labs and imaging studies  CBC: Recent Labs  Lab 09/11/19 1222 09/12/19 0812  WBC 9.0 7.4  HGB 8.5* 7.6*  HCT 28.6* 25.9*  MCV 111.7* 108.8*  PLT 155 403   Basic Metabolic Panel: Recent Labs  Lab 09/11/19 1222 09/11/19 1722 09/12/19 0812  NA 133*  --  133*  K 3.2*  --  3.2*  CL 94*  --  97*  CO2 19*  --  24  GLUCOSE 97  --  104*  BUN 43*  --  45*  CREATININE 6.43*  --  6.76*  CALCIUM 8.8*  --  8.1*  MG  --  1.9  --   PHOS  --  2.2* 2.9   GFR: Estimated Creatinine Clearance: 5.2 mL/min (A) (by C-G formula based on SCr of 6.76 mg/dL (H)). Liver Function Tests: Recent Labs  Lab 09/12/19 0812  AST 13*  ALT 21  ALKPHOS  43  BILITOT 0.4  PROT 4.1*  ALBUMIN 1.9*   No results for input(s): LIPASE, AMYLASE in the last 168 hours. No results for input(s): AMMONIA in the last 168 hours. Coagulation Profile: Recent Labs  Lab 09/11/19 1722  INR 1.0   Cardiac Enzymes: No results for input(s): CKTOTAL, CKMB, CKMBINDEX, TROPONINI in the last 168 hours. BNP (last 3 results) No results for input(s): PROBNP in the last 8760 hours. HbA1C: No results for input(s): HGBA1C in the last 72 hours. CBG: No results for input(s): GLUCAP in the last 168 hours. Lipid Profile: No results for input(s): CHOL, HDL,  LDLCALC, TRIG, CHOLHDL, LDLDIRECT in the last 72 hours. Thyroid Function Tests: Recent Labs    09/11/19 1722  TSH 1.022   Anemia Panel: Recent Labs    09/11/19 1722 09/12/19 0812  VITAMINB12 >7,500*  --   FERRITIN  --  361*  TIBC  --  153*  IRON  --  17*  RETICCTPCT  --  9.2*   Sepsis Labs: Recent Labs  Lab 09/11/19 1722 09/11/19 2000  LATICACIDVEN 1.1 2.6*    Recent Results (from the past 240 hour(s))  SARS CORONAVIRUS 2 (TAT 6-24 HRS) Nasopharyngeal Nasopharyngeal Swab     Status: None   Collection Time: 09/11/19  4:06 PM   Specimen: Nasopharyngeal Swab  Result Value Ref Range Status   SARS Coronavirus 2 NEGATIVE NEGATIVE Final    Comment: (NOTE) SARS-CoV-2 target nucleic acids are NOT DETECTED. The SARS-CoV-2 RNA is generally detectable in upper and lower respiratory specimens during the acute phase of infection. Negative results do not preclude SARS-CoV-2 infection, do not rule out co-infections with other pathogens, and should not be used as the sole basis for treatment or other patient management decisions. Negative results must be combined with clinical observations, patient history, and epidemiological information. The expected result is Negative. Fact Sheet for Patients: SugarRoll.be Fact Sheet for Healthcare Providers: https://www.woods-mathews.com/ This test is not yet approved or cleared by the [place] FDA and  has been authorized for detection and/or diagnosis of SARS-CoV-2 by FDA under an Emergency Use Authorization (EUA). This EUA will remain  in effect (meaning this test can be used) for the duration of the COVID-19 declaration under Section 56 4(b)(1) of the Act, 21 U.S.C. section 360bbb-3(b)(1), unless the authorization is terminated or revoked sooner. Performed at [facility], [place] [address] [place], [place] [address]   MRSA PCR Screening     Status: None   Collection Time: 09/12/19   6:50 AM   Specimen: Nasopharyngeal  Result Value Ref Range Status   MRSA by PCR NEGATIVE NEGATIVE Final    Comment:        The GeneXpert MRSA Assay (FDA approved for NASAL specimens only), is one component of a comprehensive MRSA colonization surveillance program. It is not intended to diagnose MRSA infection nor to guide or monitor treatment for MRSA infections. Performed at [facility], [place] [address], [place], [place] [address]          Radiology Studies: Dg Chest 2 View  Result Date: 09/11/2019 CLINICAL DATA:  Weakness. EXAM: CHEST - 2 VIEW COMPARISON:  May 30, 2019. FINDINGS: The heart size and mediastinal contours are within normal limits. Interval placement of right internal jugular dialysis catheter with distal tip in expected position of right atrium. No pneumothorax or pleural effusion is noted. Mild bibasilar subsegmental atelectasis is noted. The visualized skeletal structures are unremarkable. IMPRESSION: Interval placement of right internal jugular  dialysis catheter with distal tip in expected position of right atrium. Mild bibasilar subsegmental atelectasis is noted. Electronically Signed   By: Marijo Conception M.D.   On: 09/11/2019 15:20   Ct Head Wo Contrast  Result Date: 09/11/2019 CLINICAL DATA:  Patient became dizzy and fell. Possible brief loss of consciousness. Patient hit the back of her head. EXAM: CT HEAD WITHOUT CONTRAST TECHNIQUE: Contiguous axial images were obtained from the base of the skull through the vertex without intravenous contrast. COMPARISON:  05/30/2019 FINDINGS: Brain: No evidence of acute infarction, hemorrhage, hydrocephalus, extra-axial collection or mass lesion/mass effect. There is mild ventricular sulcal enlargement reflecting age related volume loss. Mild periventricular white matter hypoattenuation is also noted consistent with chronic microvascular ischemic change. Vascular: No hyperdense vessel or unexpected calcification.  Skull: Normal. Negative for fracture or focal lesion. Sinuses/Orbits: Globes and orbits are unremarkable. The visualized sinuses and mastoid air cells are clear. Other: Small left parietal scalp hematoma. IMPRESSION: 1. No acute intracranial abnormalities. 2. Age related volume loss. Mild chronic microvascular ischemic change. 3. Small left parietal scalp hematoma. Electronically Signed   By: Lajean Manes M.D.   On: 09/11/2019 15:41        Scheduled Meds: . sodium chloride   Intravenous Once  . allopurinol  100 mg Oral Daily  . atorvastatin  20 mg Oral Daily  . carvedilol  6.25 mg Oral BID WC  . Chlorhexidine Gluconate Cloth  6 each Topical Q0600  . dronabinol  2.5 mg Oral BID AC  . escitalopram  20 mg Oral Daily  . pantoprazole  40 mg Oral Q0600  . predniSONE  20 mg Oral Q breakfast   Continuous Infusions: . sodium chloride    . sodium chloride    . sodium chloride    . sodium chloride       LOS: 1 day    Time spent: 35 minutes spent on chart review, discussion with nursing staff, consultants, updating family and interview/physical exam; more than 50% of that time was spent in counseling and/or coordination of care.    Kendrea Cerritos J [place] DO [facility] 09/12/2019, 3:05 PM

## 2019-09-12 NOTE — Progress Notes (Signed)
Palliative Medicine RN Note: Consult order noted for [place]. In case they are needed before we can see the patient, Mrs Fleer has MOST, DNR, and HCPOA/Advance Directive forms completed and available under the ACP/Vynca tab in Epic.   Further decision making re HD and other decisions will be addressed when we have an available provider.  Marjie Skiff Kennedi Lizardo, RN, BSN, [facility] Palliative Medicine Team 09/12/2019 10:25 AM Office [phone]

## 2019-09-12 NOTE — Progress Notes (Signed)
Dr. Hartford Poli, text paged x2 regarding pt's lack of IV access for blood transfusion. Both arms restricted. Potential HD tomorrow. Asking if blood can be given in dialysis. Awaiting call back.

## 2019-09-12 NOTE — Telephone Encounter (Signed)
Received a Advertising account executive from [employer], at [employer]. Patient was admitted to the hospital yesterday and she needed to reconcile her medications and was unable to obtain the information from patient or family. I returned Samantha Mckenzie's phone call and was able to assist with updating her medication list. [name] appreciative.

## 2019-09-12 NOTE — Progress Notes (Signed)
Patient known to Renal Navigator from previous admission and new start to HD. Renal Navigator stopped by HD bedside briefly to see patient/offer support. She states that she is "not good." She reports she is "tired and uncomfortable." She declines offer for Renal Navigator to get her anything such as extra pillow or blanket. She agrees for Navigator to coming by her room later for a visit.  Alphonzo Cruise, [place] Renal Navigator [phone]

## 2019-09-12 NOTE — Procedures (Signed)
I was present at this dialysis session. I have reviewed the session itself and made appropriate changes.   3K bath, goal UF 2L.  TDC.  GI consult pending.  Hb 7.6 this AM.   She expresses some thoughts of stopping aggressive medical therapy.  Will involve palliative care.   Filed Weights   09/12/19 0114  Weight: 56.6 kg    Recent Labs  Lab 09/12/19 0812  NA 133*  K 3.2*  CL 97*  CO2 24  GLUCOSE 104*  BUN 45*  CREATININE 6.76*  CALCIUM 8.1*  PHOS 2.9    Recent Labs  Lab 09/11/19 1222 09/12/19 0812  WBC 9.0 7.4  HGB 8.5* 7.6*  HCT 28.6* 25.9*  MCV 111.7* 108.8*  PLT 155 171    Scheduled Meds: . Chlorhexidine Gluconate Cloth  6 each Topical Q0600   Continuous Infusions: . sodium chloride    . sodium chloride     PRN Meds:.sodium chloride, sodium chloride, alteplase, heparin, hydrALAZINE, lidocaine (PF), lidocaine-prilocaine, ondansetron **OR** ondansetron (ZOFRAN) IV, pentafluoroprop-tetrafluoroeth   Pearson Grippe  MD 09/12/2019, 9:54 AM

## 2019-09-12 NOTE — ED Notes (Signed)
ED TO INPATIENT HANDOFF REPORT  ED Nurse Name and Phone #: [phone]   S Name/Age/Gender Samantha Mckenzie 83 y.o. female Room/Bed: 057C/057C  Code Status   Code Status: DNR  Home/SNF/Other Home Patient oriented to: self, place, time and situation Is this baseline? Yes   Triage Complete: Triage complete  Chief Complaint Syncope  Triage Note Per [facility] pt coming from home called out for a fall. Patient states she fell cause she was feeling dizzy, possible brief LOC. Patient hit back of head and has hematoma. New dialysis patient and is due today. Hx of mastectomy so restricted left arm. Prepping right arm for dialysis. Patient also adds that she has dark red blood in her stool that has been going on for a few months. Denies any dizziness in triage.    Allergies Allergies  Allergen Reactions  . Codeine Nausea And Vomiting  . Morphine Nausea And Vomiting  . Oxycodone-Acetaminophen Nausea And Vomiting  . Sulfonamide Derivatives Nausea And Vomiting  . Tramadol Hcl Nausea And Vomiting    Level of Care/Admitting Diagnosis ED Disposition    ED Disposition Condition [facility] Area: [place] [100100]  Level of Care: Telemetry Medical [104]  Covid Evaluation: Asymptomatic Screening Protocol (No Symptoms)  Diagnosis: Symptomatic anemia [1700174]  Admitting Physician: Mckinley Jewel [[id number]]  Attending Physician: Mckinley Jewel [phone]  Estimated length of stay: past midnight tomorrow  Certification:: I certify this patient will need inpatient services for at least 2 midnights  PT Class (Do Not Modify): Inpatient [101]  PT Acc Code (Do Not Modify): Private [1]       B Medical/Surgery History Past Medical History:  Diagnosis Date  . Allergic rhinitis   . Anemia   . Breast CA ([place])    surgery, chemo, XRT; had peripheral neuropathy (imbalance at times) after chemo  . Cellulitis    Left arm, recurrent   . Cellulitis LEFT arm  recurrent 12/08/2011  . Chronic renal disease, stage IV (HCC)    Dr. Moshe Cipro  . Depression   . Fatigue   . GERD (gastroesophageal reflux disease)    gastritis, EGD 02/2007  . Gout   . Hyperlipidemia   . Hypertension   . Macular degeneration 2011  . Normal cardiac stress test 12/2014  . Osteoarthritis   . Renal insufficiency    Past Surgical History:  Procedure Laterality Date  . AV FISTULA PLACEMENT Right 08/10/2019   Procedure: ARTERIOVENOUS (AV) FISTULA CREATION VERSUS GRAFT PLACEMENT;  Surgeon: Angelia Mould, MD;  Location: [facility];  Service: Vascular;  Laterality: Right;  . BIOPSY  06/06/2019   Procedure: BIOPSY;  Surgeon: Ladene Artist, MD;  Location: [facility];  Service: Endoscopy;;  . cataracts bilaterally  1 -2012  . COLONOSCOPY WITH PROPOFOL N/A 06/06/2019   Procedure: COLONOSCOPY WITH PROPOFOL;  Surgeon: Ladene Artist, MD;  Location: [facility];  Service: Endoscopy;  Laterality: N/A;  . IR FLUORO GUIDE CV LINE RIGHT  08/03/2019  . IR US GUIDE VASC ACCESS RIGHT  08/03/2019  . LUNG BIOPSY  1999   neg  . MASTECTOMY Left 1999   and lymphnodes      A IV Location/Drains/Wounds Patient Lines/Drains/Airways Status   Active Line/Drains/Airways    Name:   Placement date:   Placement time:   Site:   Days:   Peripheral IV 09/11/19 Right;Anterior Forearm   09/11/19    1448    Forearm   1   Fistula /  Graft Right Upper arm Arteriovenous vein graft   08/10/19    0823    Upper arm   33   Hemodialysis Catheter Right Internal jugular Double lumen Permanent (Tunneled)   08/03/19    1446    Internal jugular   40   Incision (Closed) 08/07/19 Back Left   08/07/19    1055     36   Incision (Closed) 08/10/19 Arm Right   08/10/19    0833     33   Wound / Incision (Open or Dehisced) 04/07/19 Other (Comment) Head Posterior from previous fall   04/07/19    2010    Head   158   Wound / Incision (Open or Dehisced) 08/03/19 Puncture Throat Right   08/03/19    1500    Throat    40          Intake/Output Last 24 hours No intake or output data in the 24 hours ending 09/12/19 0042  Labs/Imaging Results for orders placed or performed during the hospital encounter of 09/11/19 (from the past 48 hour(s))  POC occult blood, ED Provider will collect     Status: Abnormal   Collection Time: 09/11/19 12:09 PM  Result Value Ref Range   Fecal Occult Bld POSITIVE (A) NEGATIVE  Basic metabolic panel     Status: Abnormal   Collection Time: 09/11/19 12:22 PM  Result Value Ref Range   Sodium 133 (L) 135 - 145 mmol/L   Potassium 3.2 (L) 3.5 - 5.1 mmol/L   Chloride 94 (L) 98 - 111 mmol/L   CO2 19 (L) 22 - 32 mmol/L   Glucose, Bld 97 70 - 99 mg/dL   BUN 43 (H) 8 - 23 mg/dL   Creatinine, Ser 6.43 (H) 0.44 - 1.00 mg/dL   Calcium 8.8 (L) 8.9 - 10.3 mg/dL   GFR calc non Af Amer 6 (L) >60 mL/min   GFR calc Af Amer 6 (L) >60 mL/min   Anion gap 20 (H) 5 - 15    Comment: Performed at [facility], [address] [place], [place] [address]  CBC     Status: Abnormal   Collection Time: 09/11/19 12:22 PM  Result Value Ref Range   WBC 9.0 4.0 - 10.5 K/uL   RBC 2.56 (L) 3.87 - 5.11 MIL/uL   Hemoglobin 8.5 (L) 12.0 - 15.0 g/dL   HCT 28.6 (L) 36.0 - 46.0 %   MCV 111.7 (H) 80.0 - 100.0 fL   MCH 33.2 26.0 - 34.0 pg   MCHC 29.7 (L) 30.0 - 36.0 g/dL   RDW 19.8 (H) 11.5 - 15.5 %   Platelets 155 150 - 400 K/uL   nRBC 2.2 (H) 0.0 - 0.2 %    Comment: Performed at [facility], [place] [address] [place], [place] [address]  Type and screen     Status: None   Collection Time: 09/11/19  2:48 PM  Result Value Ref Range   ABO/RH(D) A POS    Antibody Screen NEG    Sample Expiration      09/14/2019,2359 Performed at [facility], [place] [address], [place], [place] [address]   SARS CORONAVIRUS 2 (TAT 6-24 HRS) Nasopharyngeal Nasopharyngeal Swab     Status: None   Collection Time: 09/11/19  4:06 PM   Specimen: Nasopharyngeal Swab  Result Value Ref Range   SARS Coronavirus 2  NEGATIVE NEGATIVE    Comment: (NOTE) SARS-CoV-2 target nucleic acids are NOT DETECTED.  The SARS-CoV-2 RNA is generally detectable in upper and lower respiratory specimens during the acute phase of infection. Negative results do not preclude SARS-CoV-2 infection, do not rule out co-infections with other pathogens, and should not be used as the sole basis for treatment or other patient management decisions. Negative results must be combined with clinical observations, patient history, and epidemiological information. The expected result is Negative. Fact Sheet for Patients: SugarRoll.be Fact Sheet for Healthcare Providers: https://www.woods-mathews.com/ This test is not yet approved or cleared by the [place] FDA and  has been authorized for detection and/or diagnosis of SARS-CoV-2 by FDA under an Emergency Use Authorization (EUA). This EUA will remain  in effect (meaning this test can be used) for the duration of the COVID-19 declaration under Section 56 4(b)(1) of the Act, 21 U.S.C. section 360bbb-3(b)(1), unless the authorization is terminated or revoked sooner. Performed at [facility], [place] [address], [place], [place] [address]   Magnesium     Status: None   Collection Time: 09/11/19  5:22 PM  Result Value Ref Range   Magnesium 1.9 1.7 - 2.4 mg/dL    Comment: Performed at [facility], [place] [address] [place], [place] [address]  Phosphorus     Status: Abnormal   Collection Time: 09/11/19  5:22 PM  Result Value Ref Range   Phosphorus 2.2 (L) 2.5 - 4.6 mg/dL    Comment: Performed at [facility] [address], [place], [place] [address]  Protime-INR     Status: None   Collection Time: 09/11/19  5:22 PM  Result Value Ref Range   Prothrombin Time 13.2 11.4 - 15.2 seconds   INR 1.0 0.8 - 1.2    Comment: (NOTE) INR goal varies based on device and disease states. Performed at [facility], [place] [address], [place], [place] [address]   APTT     Status: Abnormal   Collection Time: 09/11/19  5:22 PM  Result Value Ref Range   aPTT 21 (L) 24 - 36 seconds    Comment: Performed at [facility] [address], [place], [place] [address]  Troponin I (High Sensitivity)     Status: Abnormal   Collection Time: 09/11/19  5:22 PM  Result Value Ref Range   Troponin I (High Sensitivity) 37 (H) <18 ng/L    Comment: (NOTE) Elevated high sensitivity troponin I (hsTnI) values and significant  changes across serial measurements may suggest ACS but many other  chronic and acute conditions are known to elevate hsTnI results.  Refer to the "Links" section for chest pain algorithms and additional  guidance. Performed at [facility], [place] [address], [place], [place] [address]   Lactic acid, plasma     Status: None   Collection Time: 09/11/19  5:22 PM  Result Value Ref Range   Lactic Acid, Venous 1.1 0.5 - 1.9 mmol/L    Comment: Performed at [facility] [address], [place], [place] [address]  Vitamin B12     Status: Abnormal   Collection Time: 09/11/19  5:22 PM  Result Value Ref Range   Vitamin B-12 >7,500 (H) 180 - 914 pg/mL    Comment: RESULT CONFIRMED BY AUTOMATED DILUTION Performed at [facility], [place] [address], [place], [place] [address]   TSH     Status: None   Collection Time: 09/11/19  5:22 PM  Result Value Ref Range   TSH 1.022 0.350 - 4.500  uIU/mL    Comment: Performed by a 3rd Generation assay with a functional sensitivity of <=0.01 uIU/mL. Performed at [facility], [place] [address], [place], [place] [address]   Lactic acid, plasma     Status: Abnormal   Collection Time: 09/11/19  8:00 PM  Result Value Ref Range   Lactic Acid, Venous 2.6 (HH) 0.5 - 1.9 mmol/L    Comment: CRITICAL RESULT CALLED TO, READ BACK BY AND VERIFIED WITH: B HONEYCUTT,RN 2049 09/11/2019 WBOND Performed at [facility], [place] [address], [place], [place] [address]    Troponin I (High Sensitivity)     Status: Abnormal   Collection Time: 09/11/19  8:00 PM  Result Value Ref Range   Troponin I (High Sensitivity) 41 (H) <18 ng/L    Comment: (NOTE) Elevated high sensitivity troponin I (hsTnI) values and significant  changes across serial measurements may suggest ACS but many other  chronic and acute conditions are known to elevate hsTnI results.  Refer to the "Links" section for chest pain algorithms and additional  guidance. Performed at [facility], [place] [address] [place], [place] [address]    Dg Chest 2 View  Result Date: 09/11/2019 CLINICAL DATA:  Weakness. EXAM: CHEST - 2 VIEW COMPARISON:  May 30, 2019. FINDINGS: The heart size and mediastinal contours are within normal limits. Interval placement of right internal jugular dialysis catheter with distal tip in expected position of right atrium. No pneumothorax or pleural effusion is noted. Mild bibasilar subsegmental atelectasis is noted. The visualized skeletal structures are unremarkable. IMPRESSION: Interval placement of right internal jugular dialysis catheter with distal tip in expected position of right atrium. Mild bibasilar subsegmental atelectasis is noted. Electronically Signed   By: Marijo Conception M.D.   On: 09/11/2019 15:20   Ct Head Wo Contrast  Result Date: 09/11/2019 CLINICAL DATA:  Patient became dizzy and fell. Possible brief loss of consciousness. Patient hit the back of her head. EXAM: CT HEAD WITHOUT CONTRAST TECHNIQUE: Contiguous axial images were obtained from the base of the skull through the vertex without intravenous contrast. COMPARISON:  05/30/2019 FINDINGS: Brain: No evidence of acute infarction, hemorrhage, hydrocephalus, extra-axial collection or mass lesion/mass effect. There is mild ventricular sulcal enlargement reflecting age related volume loss. Mild periventricular white matter hypoattenuation is also noted consistent with chronic microvascular ischemic change.  Vascular: No hyperdense vessel or unexpected calcification. Skull: Normal. Negative for fracture or focal lesion. Sinuses/Orbits: Globes and orbits are unremarkable. The visualized sinuses and mastoid air cells are clear. Other: Small left parietal scalp hematoma. IMPRESSION: 1. No acute intracranial abnormalities. 2. Age related volume loss. Mild chronic microvascular ischemic change. 3. Small left parietal scalp hematoma. Electronically Signed   By: Lajean Manes M.D.   On: 09/11/2019 15:41    Pending Labs Unresulted Labs (From admission, onward)    Start     Ordered   09/12/19 0500  Comprehensive metabolic panel  Tomorrow morning,   R     09/11/19 1634   09/12/19 0500  CBC  Tomorrow morning,   R     09/11/19 1634   09/12/19 0500  Iron and TIBC  (Anemia Panel (PNL))  Tomorrow morning,   R    Question:  Specimen collection method  Answer:  Lab=Lab collect   09/11/19 1651   09/12/19 0500  Ferritin  (Anemia Panel (PNL))  Tomorrow morning,   R    Question:  Specimen collection method  Answer:  Lab=Lab collect  09/11/19 1651   09/12/19 0500  Reticulocytes  (Anemia Panel (PNL))  Tomorrow morning,   R    Question:  Specimen collection method  Answer:  Lab=Lab collect   09/11/19 1651   09/11/19 1639  Folate RBC  Add-on,   AD     09/11/19 1638   Signed and Held  Renal function panel  Once,   R     Signed and Held   Signed and Held  CBC  Once,   R     Signed and Held          Vitals/Pain Today's Vitals   09/11/19 1938 09/11/19 2000 09/11/19 2100 09/11/19 2200  BP: (!) 229/47 (!) 231/53 (!) 198/45 (!) 188/46  Pulse: 65 (!) 58 62 64  Resp: 16 16 13 16   Temp:      TempSrc:      SpO2: 100% 100% 98% 100%  PainSc:        Isolation Precautions No active isolations  Medications Medications  ondansetron (ZOFRAN) tablet 4 mg (has no administration in time range)    Or  ondansetron (ZOFRAN) injection 4 mg (has no administration in time range)  hydrALAZINE (APRESOLINE) injection 10 mg  (10 mg Intravenous Given 09/11/19 2041)  Chlorhexidine Gluconate Cloth 2 % PADS 6 each (has no administration in time range)  pentafluoroprop-tetrafluoroeth (GEBAUERS) aerosol 1 application (has no administration in time range)  lidocaine (PF) (XYLOCAINE) 1 % injection 5 mL (has no administration in time range)  lidocaine-prilocaine (EMLA) cream 1 application (has no administration in time range)  0.9 %  sodium chloride infusion (has no administration in time range)  0.9 %  sodium chloride infusion (has no administration in time range)  heparin injection 1,000 Units (has no administration in time range)  alteplase (CATHFLO ACTIVASE) injection 2 mg (has no administration in time range)    Mobility walks High fall risk   Focused Assessments Cardiac Assessment Handoff:  Cardiac Rhythm: Normal sinus rhythm Lab Results  Component Value Date   CKTOTAL 69 03/08/2014   CKMB 1.5 06/02/2011   TROPONINI <0.03 06/20/2018   Lab Results  Component Value Date   DDIMER 1.05 (H) 12/15/2014   Does the Patient currently have chest pain? No     R Recommendations: See Admitting Provider Note  Report given to:   Additional Notes:

## 2019-09-12 NOTE — TOC Initial Note (Signed)
Transition of Care [facility]) - Initial/Assessment Note    Patient Details  Name: Samantha Mckenzie MRN: [id number] Date of Birth: [birth year]  [facility] of [facility] CM/SW Contact:    Alexander Mt, [place] Phone Number: 09/12/2019, 5:37 PM  Clinical Narrative:                 CSW spoke with pt and pt son [name] at bedside. Introduced self, role, reason for visit. Pt from home with her spouse. Her son helps as able but usually she ambulates with rollator and uses the furniture and grab bars at home to get around. She has multiple pieces of DME including the rollator and 3 in 1.   Pt has palliative services that come into her home and provide assistance; she also has someone that comes and "fixes my medications." At this time pt has transportation and medications managed by her family. They are interested in SCAT information if possible. Pt describes feeling weak and increased need for assistance; she also describes being physically and emotionally tired. CSW provided support.   We discussed getting therapy assessment. She has been to [facility] before and if she needed to go to SNF she would consider but at this time she is open to return home if that was safe.   TOC team continues to follow, requested MD place PT/OT orders.   Expected Discharge Plan: [place] Barriers to Discharge: Continued Medical Work up   Patient Goals and CMS Choice Patient states their goals for this hospitalization and ongoing recovery are:: to feel better; I feel so weak CMS Medicare.gov Compare Post Acute Care list provided to:: Patient Choice offered to / list presented to : Patient, Adult Children  Expected Discharge Plan and Services Expected Discharge Plan: [place] In-house Referral: Clinical Social Work, Hospice / [place], [facility] Discharge Planning Services: CM Consult Post Acute Care Choice: Resumption of Svcs/PTA Provider(TBD at this time) Living arrangements  for the past 2 months: Single Family Home                                      Prior Living Arrangements/Services Living arrangements for the past 2 months: Single Family Home Lives with:: Spouse Patient language and need for interpreter reviewed:: Yes(no needs) Do you feel safe going back to the place where you live?: Yes      Need for Family Participation in Patient Care: Yes (Comment)(support/assistance with ADL and IADLs) Care giver support system in place?: Yes (comment)(adult son and spouse) Current home services: DME, Hospice, Other (comment) Criminal Activity/Legal Involvement Pertinent to Current Situation/Hospitalization: No - Comment as needed  Activities of Daily Living      Permission Sought/Granted Permission sought to share information with : Facility Sport and exercise psychologist, Family Supports Permission granted to share information with : Yes, Verbal Permission Granted  Share Information with NAME: Tijah Hane; [place] granted to share info w AGENCY: SNFs if needed; [place] granted to share info w Relationship: son; husband  Permission granted to share info w Contact Information: [phone] (son)  Emotional Assessment Appearance:: Appears stated age Attitude/Demeanor/Rapport: Gracious, Engaged Affect (typically observed): Pleasant, Accepting, Appropriate, Overwhelmed Orientation: : Oriented to Self, Oriented to Place, Oriented to  Time, Oriented to Situation Alcohol / Substance Use: Not Applicable Psych Involvement: No (comment)  Admission diagnosis:  Closed head injury, initial  encounter [S09.90XA] Gastrointestinal hemorrhage, unspecified gastrointestinal hemorrhage type [K92.2] Syncope, unspecified syncope type [R55] Patient Active Problem List   Diagnosis Date Noted  . Symptomatic anemia 09/11/2019  . ESRD (end stage renal disease) on dialysis ([place])   . GI bleed   . Cellulitis of arm, left 08/02/2019   . Acidosis   . Dementia ([place]) 06/17/2019  . Abnormal CT scan, small bowel   . Dehydration 05/31/2019  . Acute blood loss anemia   . Ileitis 05/30/2019  . Terminal ileitis with complication ([place]) 68/05/8109  . COPD (chronic obstructive pulmonary disease) ([place]) 04/12/2019  . Enteritis 04/07/2019  . Chronic diastolic CHF (congestive heart failure) ([place]) 04/07/2019  . Hypokalemia 04/07/2019  . Hypocalcemia 04/07/2019  . Fall 04/07/2019  . Occult blood positive stool 04/07/2019  . Adjustment disorder with depressed mood   . Generalized abdominal pain   . Dysphasia 03/21/2019  . CAP (community acquired pneumonia) 03/20/2019  . Nausea vomiting and diarrhea 03/20/2019  . Acute respiratory failure with hypoxia ([place]) 03/20/2019  . Acute renal failure superimposed on stage 4 chronic kidney disease ([place]) 03/20/2019  . Lactic acidosis 03/20/2019  . Diabetic peripheral neuropathy associated with type 2 diabetes mellitus ([place]) 03/20/2019  . Hypomagnesemia 06/21/2018  . Generalized weakness 06/20/2018  . PCP NOTES >>> 07/11/2015  . Sepsis ([place]) 04/19/2013  . Depression   . VITAMIN B12 DEFICIENCY 04/01/2008  . GERD (gastroesophageal reflux disease) 12/19/2007  . NEOP, MALIGNANT, FEMALE BREAST NOS 07/07/2007  . Osteoarthritis 07/07/2007  . Diabetes mellitus type 2 with complications ([place]) 31/59/4585  . HLD (hyperlipidemia) 02/28/2007  . Gout 02/28/2007  . Macrocytic anemia 02/28/2007  . Essential hypertension 02/28/2007   PCP:  Colon Branch, MD Pharmacy:   [facility], [place] [address] [place] [address] Phone: [phone] Fax: [facility] [address], [place]. [place]. [place] [address] Phone: [phone] Fax: [phone]     Social Determinants of Health (SDOH) Interventions    Readmission Risk Interventions Readmission Risk Prevention Plan  09/12/2019 08/03/2019 08/03/2019  Transportation Screening Complete Complete -  PCP or Specialist Appt within 3-5 Days - - -  [place]RI or [place] for [place] - - -  Medication Review (RN Care Manager) Referral to Pharmacy Referral to Pharmacy -  PCP or Specialist appointment within 3-5 days of discharge Not Complete Complete -  PCP/Specialist Appt Not Complete comments plan for disposition pending - -  HRI or Home Care Consult Complete Complete -  SW Recovery Care/Counseling Consult Complete Complete Complete  Palliative Care Screening Complete Complete Complete  Skilled Nursing Facility Complete Not Applicable -  Some recent data might be hidden

## 2019-09-12 NOTE — Plan of Care (Signed)
  Problem: Education: Goal: Knowledge of General Education information will improve Description Including pain rating scale, medication(s)/side effects and non-pharmacologic comfort measures Outcome: Progressing   

## 2019-09-12 NOTE — Progress Notes (Signed)
Paged on call MD regarding pt. lab draw this morning due to pt. having right arm fistula and left arm restrictions. Was told to place a crae order to allow lab draw from right arm for this morning. Was also told that morning MD should place an order moving forward regarding lab draws.

## 2019-09-12 NOTE — Plan of Care (Signed)

## 2019-09-13 ENCOUNTER — Encounter (HOSPITAL_COMMUNITY): Payer: Self-pay | Admitting: General Practice

## 2019-09-13 ENCOUNTER — Inpatient Hospital Stay (HOSPITAL_COMMUNITY): Payer: Medicare Other

## 2019-09-13 ENCOUNTER — Other Ambulatory Visit: Payer: Self-pay

## 2019-09-13 DIAGNOSIS — D539 Nutritional anemia, unspecified: Secondary | ICD-10-CM

## 2019-09-13 DIAGNOSIS — Z992 Dependence on renal dialysis: Secondary | ICD-10-CM

## 2019-09-13 DIAGNOSIS — E876 Hypokalemia: Secondary | ICD-10-CM

## 2019-09-13 DIAGNOSIS — K219 Gastro-esophageal reflux disease without esophagitis: Secondary | ICD-10-CM

## 2019-09-13 DIAGNOSIS — Z7189 Other specified counseling: Secondary | ICD-10-CM

## 2019-09-13 DIAGNOSIS — N186 End stage renal disease: Secondary | ICD-10-CM

## 2019-09-13 DIAGNOSIS — I10 Essential (primary) hypertension: Secondary | ICD-10-CM

## 2019-09-13 DIAGNOSIS — E785 Hyperlipidemia, unspecified: Secondary | ICD-10-CM

## 2019-09-13 DIAGNOSIS — Z515 Encounter for palliative care: Secondary | ICD-10-CM

## 2019-09-13 DIAGNOSIS — F329 Major depressive disorder, single episode, unspecified: Secondary | ICD-10-CM

## 2019-09-13 DIAGNOSIS — Z66 Do not resuscitate: Secondary | ICD-10-CM | POA: Diagnosis present

## 2019-09-13 LAB — FOLATE RBC
Folate, Hemolysate: 575 ng/mL
Folate, RBC: 2376 ng/mL (ref >498)
Hematocrit: 24.2 % — ABNORMAL LOW (ref 34.0–46.6)

## 2019-09-13 LAB — PREPARE RBC (CROSSMATCH)

## 2019-09-13 LAB — CBC
HCT: 23.8 % — ABNORMAL LOW (ref 36.0–46.0)
Hemoglobin: 7.1 g/dL — ABNORMAL LOW (ref 12.0–15.0)
MCH: 32.7 pg (ref 26.0–34.0)
MCHC: 29.8 g/dL — ABNORMAL LOW (ref 30.0–36.0)
MCV: 109.7 fL — ABNORMAL HIGH (ref 80.0–100.0)
Platelets: 127 10*3/uL — ABNORMAL LOW (ref 150–400)
RBC: 2.17 MIL/uL — ABNORMAL LOW (ref 3.87–5.11)
RDW: 20 % — ABNORMAL HIGH (ref 11.5–15.5)
WBC: 6.6 10*3/uL (ref 4.0–10.5)
nRBC: 0.9 % — ABNORMAL HIGH (ref 0.0–0.2)

## 2019-09-13 MED ORDER — CHLORHEXIDINE GLUCONATE CLOTH 2 % EX PADS
6.0000 | MEDICATED_PAD | Freq: Every day | CUTANEOUS | Status: DC
  Administered 2019-09-14 – 2019-09-17 (×4): 6 via TOPICAL

## 2019-09-13 MED ORDER — PRO-STAT SUGAR FREE PO LIQD
30.0000 mL | Freq: Two times a day (BID) | ORAL | Status: DC
  Administered 2019-09-13 – 2019-09-16 (×3): 30 mL via ORAL
  Filled 2019-09-13 (×7): qty 30

## 2019-09-13 MED ORDER — SODIUM CHLORIDE 0.9% IV SOLUTION: Freq: Once | INTRAVENOUS | Status: DC

## 2019-09-13 MED ORDER — HEPARIN SODIUM (PORCINE) 1000 UNIT/ML IJ SOLN
INTRAMUSCULAR | Status: AC
  Administered 2019-09-13: 3200 [IU] via INTRAVENOUS_CENTRAL
  Filled 2019-09-13: qty 4

## 2019-09-13 MED ORDER — RENA-VITE PO TABS
1.0000 | ORAL_TABLET | Freq: Every day | ORAL | Status: DC
  Administered 2019-09-13 – 2019-09-17 (×5): 1 via ORAL
  Filled 2019-09-13 (×5): qty 1

## 2019-09-13 MED ORDER — LOPERAMIDE HCL 2 MG PO CAPS
2.0000 mg | ORAL_CAPSULE | Freq: Every day | ORAL | Status: DC
  Administered 2019-09-14 – 2019-09-18 (×5): 2 mg via ORAL
  Filled 2019-09-13 (×5): qty 1

## 2019-09-13 MED ORDER — LOPERAMIDE HCL 2 MG PO CAPS
2.0000 mg | ORAL_CAPSULE | Freq: Two times a day (BID) | ORAL | Status: AC
  Administered 2019-09-13 (×2): 2 mg via ORAL
  Filled 2019-09-13 (×2): qty 1

## 2019-09-13 MED ORDER — BOOST / RESOURCE BREEZE PO LIQD CUSTOM
1.0000 | Freq: Three times a day (TID) | ORAL | Status: DC
  Administered 2019-09-13 – 2019-09-17 (×4): 1 via ORAL

## 2019-09-13 NOTE — TOC Progression Note (Signed)
Transition of Care [facility]) - Progression Note    Patient Details  Name: Samantha Mckenzie MRN: [id number] Date of Birth: [birth year]  Transition [facility]) CM/SW Contact  Jacalyn Lefevre Edson Snowball, RN Phone Number: 09/13/2019, 4:34 PM  Clinical Narrative:     Discussed PT /OT recommendations for home health. Patient has had [facility] in past ( not currently active) . At this time patient unsure if she wants home health at discharge. States she already has DME.     Expected Discharge Plan: [place] Barriers to Discharge: Continued Medical Work up  Expected Discharge Plan and Services Expected Discharge Plan: [place] In-house Referral: Clinical Social Work, Hospice / [place], [facility] Discharge Planning Services: CM Consult Post Acute Care Choice: [place] arrangements for the past 2 months: Single Family Home                 DME Arranged: N/A                     Social Determinants of Health (SDOH) Interventions    Readmission Risk Interventions Readmission Risk Prevention Plan 09/12/2019 08/03/2019 08/03/2019  Transportation Screening Complete Complete -  PCP or Specialist Appt within 3-5 Days - - -  HRI or [place] Work Consult for [place] - - -  Medication Review (RN Care Manager) Referral to Pharmacy Referral to Pharmacy -  PCP or Specialist appointment within 3-5 days of discharge Not Complete Complete -  PCP/Specialist Appt Not Complete comments plan for disposition pending - -  HRI or Home Care Consult Complete Complete -  SW Recovery Care/Counseling Consult Complete Complete Complete  Palliative Care Screening Complete Complete Complete  Skilled Nursing Facility Complete Not Applicable -  Some recent data might be hidden

## 2019-09-13 NOTE — Progress Notes (Signed)
PROGRESS NOTE    Samantha Mckenzie  HGD:[id number] DOB: 1936/08/05 DOA: 09/11/2019 PCP: Samantha Branch, Mckenzie   Brief Narrative:  Samantha Borba Smithis a 83 y.o.femalewith medical history significant ofdepression, breast cancer status post left mastectomy/chemotherapy/radiation therapy, anemia of chronic disease, Crohn's ileitis, coronary artery disease, end-stage renal disease-recently started on hemodialysis, GERD presents to emergency department after a syncopal episode this morning.  Patient tells me that while she was getting ready for her dialysis appointment she fell on the floor and hit on her head. She found herself on the floor after a few minutes. She is uncertain as to whether she lost consciousness. Denies headache, blurry vision, chest pain, shortness of breath, palpitation, seizures, urinary or bowel incontinence. She tells me that she was dizzy this morning.  Reports bloody stools since 1 month. Reports decreased appetite and weight loss however denies association with nausea, vomiting, epigastric pain, family history of Samantha cancer, over-the-counter use of NSAIDs or history of hemorrhoids. She tells me that she had loose stool with bright red blood this morning. She had a colonoscopy on 06/06/2019 which showed terminal ileum stricture and diverticulosis in left Samantha-biopsy came backsuspicious for Crohn's disease and was treated with IV Solu-Medrol and discharged home on 10 weeks of tapering prednisone.  She started on hemodialysis approximately 1 month ago and has been compliant with her appointments however she missed her dialysis appointment this morning due to syncopal episode.  She lives with her husband, denies smoking, alcohol, illicit drug use.  ED Course:On arrival patient's blood pressure noted to be elevated, fecal occult positive, H&H 8.5/28.6, MCV 111.7, potassium: 3.2, chest x-ray negative for acute finding, CT head negative for stroke, COVID-19 pending.EDP  consulted GI for further evaluation and management.   Assessment & Plan:   Principal Problem:   Symptomatic anemia Active Problems:   HLD (hyperlipidemia)   Gout   Macrocytic anemia   Essential hypertension   GERD (gastroesophageal reflux disease)   Depression   Crohn's disease of small intestine with rectal bleeding (HCC)   Hypokalemia   ESRD (end stage renal disease) on dialysis (HCC)   GI bleed   Symptomatic anemia, macrocytic; multifactorial anemia of chronic renal disease coupled with active Crohn's colitis Recent diagnosis Crohn's inflammatory bowel disease Patient presenting with rest of fatigue and weakness.  Reports continued bloody bowel movements x1 month.  Recently diagnosed with Crohn's colitis from colonoscopy with biopsy 06/06/2019, currently on prednisone 20 mg p.o. daily.  Hemoglobin 8.5 with MCV of 111.7.  Anemia panel with iron 17, TIBC 153, ferritin 361, B12 > 7,500.  Etiology of anemia likely multifactorial with continued active Crohn's disease coupled with anemia of chronic disease from renal disease. --Gastroenterology following, appreciate assistance --CT A/P-unfortunately pt refused to drink contrast yesterday, agreed to drink it this am --on ESA weekly outpatient --Hgb 8.5-->7.6 --Transfuse 1 unit PRBC with HD today --Continue prednisone 20 mg p.o. daily --Awaiting further recommendations from gastroenterology  ESRD on HD Patient dialyzes at [facility] on a TTS schedule.  Has right IJ tunneled dialysis catheter in place.  Has right AV graft in place; not currently cannulized. --Nephrology following, appreciate assistance --HD today  Chronic diastolic congestive heart failure TTE 06/07/2017 with EF 5% with grade 1 diastolic dysfunction.  Chest x-ray on admission with no acute cardiopulmonary findings. --continue home carvedilol 6.25 mg p.o. twice daily --Continue monitor daily weights and strict I's and O's --Volume management per HD  Essential  hypertension BP 137/43this morning --conthome carvedilol 6.25 mg  p.o. twice daily and hydralazine 10 mg p.o. every 8 hours --Received HD this morning --Continue monitor blood pressure  Depression: Continue Lexapro  Gout: Continue allopurinol  HLD: Continue statin  GERD: Continue PPI  Moderate protein calorie malnutrition: --Continue Marinol for appetite stimulation --Nutrition consult for supplementation needs  DVT prophylaxis: SCD/Compression stockings  Code Status: DNR    Code Status Orders  (From admission, onward)         Start     Ordered   09/11/19 1633  Mckenzie not attempt resuscitation (DNR)  Continuous    Question Answer Comment  In the event of cardiac or respiratory ARREST Mckenzie not call a code blue   In the event of cardiac or respiratory ARREST Mckenzie not perform Intubation, CPR, defibrillation or ACLS   In the event of cardiac or respiratory ARREST Use medication by any route, position, wound care, and other measures to relive pain and suffering. May use oxygen, suction and manual treatment of airway obstruction as needed for comfort.      09/11/19 1634        Code Status History    Date Active Date Inactive Code Status Order ID Comments User Context   08/03/2019 0240 08/10/2019 1958 DNR [id number]  Samantha Bulls, Mckenzie Inpatient   05/30/2019 1303 06/09/2019 1648 DNR [id number]  Samantha Memos, Mckenzie ED   04/20/2019 1257 04/24/2019 1516 DNR [id number]  Samantha Mckenzie ED   04/07/2019 2112 04/12/2019 2150 DNR [id number]  Samantha Costa, Mckenzie Inpatient   04/07/2019 2106 04/07/2019 2112 Full Code [id number]  Samantha Costa, Mckenzie Inpatient   04/07/2019 1833 04/07/2019 2106 Full Code [id number]  Samantha Rhymes, Mckenzie ED   03/20/2019 0057 03/27/2019 1445 Full Code [id number]  Samantha Quill, Mckenzie ED   06/20/2018 2117 06/21/2018 2051 Full Code [id number]  Samantha Morton, Mckenzie Inpatient   04/19/2013 2050 04/23/2013 1442 Full Code [id number]  Samantha Mckenzie ED   Advance Care Planning Activity     Advance Directive Documentation     Most Recent Value  Type of Advance Directive  Living will  Pre-existing out of facility DNR order (yellow form or pink MOST form)  --  "MOST" Form in Place?  --     Family Communication: Spoke with son who works in [employer] here, answered all questions Disposition Plan:   Patient will remain inpatient, follow-up with GI, further radiographic evaluation with CT, monitoring of hemoglobin and continued steroids.  Patient not safe or stable for discharge Consults called: None Admission status: Inpatient   Consultants:   gastroenterology, nephrology, palliative care  Procedures:  Dg Chest 2 View  Result Date: 09/11/2019 CLINICAL DATA:  Weakness. EXAM: CHEST - 2 VIEW COMPARISON:  May 30, 2019. FINDINGS: The heart size and mediastinal contours are within normal limits. Interval placement of right internal jugular dialysis catheter with distal tip in expected position of right atrium. No pneumothorax or pleural effusion is noted. Mild bibasilar subsegmental atelectasis is noted. The visualized skeletal structures are unremarkable. IMPRESSION: Interval placement of right internal jugular dialysis catheter with distal tip in expected position of right atrium. Mild bibasilar subsegmental atelectasis is noted. Electronically Signed   By: Marijo Conception M.D.   On: 09/11/2019 15:20   Ct Head Wo Contrast  Result Date: 09/11/2019 CLINICAL DATA:  Patient became dizzy and fell. Possible brief loss of consciousness. Patient hit the back of her head. EXAM: CT HEAD WITHOUT CONTRAST TECHNIQUE: Contiguous axial images were obtained from the  base of the skull through the vertex without intravenous contrast. COMPARISON:  05/30/2019 FINDINGS: Brain: No evidence of acute infarction, hemorrhage, hydrocephalus, extra-axial collection or mass lesion/mass effect. There is mild ventricular sulcal enlargement reflecting age related volume loss. Mild periventricular white matter  hypoattenuation is also noted consistent with chronic microvascular ischemic change. Vascular: No hyperdense vessel or unexpected calcification. Skull: Normal. Negative for fracture or focal lesion. Sinuses/Orbits: Globes and orbits are unremarkable. The visualized sinuses and mastoid air cells are clear. Other: Small left parietal scalp hematoma. IMPRESSION: 1. No acute intracranial abnormalities. 2. Age related volume loss. Mild chronic microvascular ischemic change. 3. Small left parietal scalp hematoma. Electronically Signed   By: Lajean Manes M.D.   On: 09/11/2019 15:41     Antimicrobials:   None   Subjective: Patient seen and examined at bedside Reports feeling dizzy intermittently Did not drink the p.o. contrast for the CT An extensive conversation on the importance of this for further evaluation  Objective: Vitals:   09/12/19 1310 09/12/19 1825 09/12/19 2154 09/13/19 0529  BP: (!) 127/57 (!) 164/35 (!) 111/37 (!) 137/43  Pulse: 73 75 75 70  Resp: 15 14 15 15   Temp: 97.7 F (36.5 C) 98.4 F (36.9 C) 98 F (36.7 C) 98.2 F (36.8 C)  TempSrc: Oral Oral Oral Oral  SpO2: 96% 98% 99% 100%  Weight:      Height:        Intake/Output Summary (Last 24 hours) at 09/13/2019 1228 Last data filed at 09/13/2019 0600 Gross per 24 hour  Intake 243 ml  Output 0 ml  Net 243 ml   Filed Weights   09/12/19 0114 09/12/19 0730 09/12/19 1148  Weight: 56.6 kg 56.8 kg 52.8 kg    Examination:  General exam: Appears calm and comfortable  Respiratory system: Clear to auscultation. Respiratory effort normal.  Oxygenating well on room air Cardiovascular system: S1 & S2 heard, RRR. No JVD, murmurs, rubs, gallops or clicks. No pedal edema.  Right IJ dialysis catheter noted, right AV fistula noted Gastrointestinal system: Abdomen is nondistended, soft and nontender. No organomegaly or masses felt. Normal bowel sounds heard. Central nervous system: Alert and oriented. No focal neurological  deficits. Extremities: Warm well perfused neurovascularly intact. Skin: No rashes, lesions or ulcers Psychiatry: Judgement and insight appear normal. Mood & affect flat.     Data Reviewed: I have personally reviewed following labs and imaging studies  CBC: Recent Labs  Lab 09/11/19 1222 09/12/19 0812 09/13/19 0146  WBC 9.0 7.4 6.6  HGB 8.5* 7.6* 7.1*  HCT 28.6* 25.9* 23.8*  MCV 111.7* 108.8* 109.7*  PLT 155 171 664*   Basic Metabolic Panel: Recent Labs  Lab 09/11/19 1222 09/11/19 1722 09/12/19 0812  NA 133*  --  133*  K 3.2*  --  3.2*  CL 94*  --  97*  CO2 19*  --  24  GLUCOSE 97  --  104*  BUN 43*  --  45*  CREATININE 6.43*  --  6.76*  CALCIUM 8.8*  --  8.1*  MG  --  1.9  --   PHOS  --  2.2* 2.9   GFR: Estimated Creatinine Clearance: 5.2 mL/min (A) (by C-G formula based on SCr of 6.76 mg/dL (H)). Liver Function Tests: Recent Labs  Lab 09/12/19 0812  AST 13*  ALT 21  ALKPHOS 43  BILITOT 0.4  PROT 4.1*  ALBUMIN 1.9*   No results for input(s): LIPASE, AMYLASE in the last 168 hours. No results  for input(s): AMMONIA in the last 168 hours. Coagulation Profile: Recent Labs  Lab 09/11/19 1722  INR 1.0   Cardiac Enzymes: No results for input(s): CKTOTAL, CKMB, CKMBINDEX, TROPONINI in the last 168 hours. BNP (last 3 results) No results for input(s): PROBNP in the last 8760 hours. HbA1C: No results for input(s): HGBA1C in the last 72 hours. CBG: No results for input(s): GLUCAP in the last 168 hours. Lipid Profile: No results for input(s): CHOL, HDL, LDLCALC, TRIG, CHOLHDL, LDLDIRECT in the last 72 hours. Thyroid Function Tests: Recent Labs    09/11/19 1722  TSH 1.022   Anemia Panel: Recent Labs    09/11/19 1722 09/12/19 0812  VITAMINB12 >7,500*  --   FERRITIN  --  361*  TIBC  --  153*  IRON  --  17*  RETICCTPCT  --  9.2*   Sepsis Labs: Recent Labs  Lab 09/11/19 1722 09/11/19 2000  LATICACIDVEN 1.1 2.6*    Recent Results (from the  past 240 hour(s))  SARS CORONAVIRUS 2 (TAT 6-24 HRS) Nasopharyngeal Nasopharyngeal Swab     Status: None   Collection Time: 09/11/19  4:06 PM   Specimen: Nasopharyngeal Swab  Result Value Ref Range Status   SARS Coronavirus 2 NEGATIVE NEGATIVE Final    Comment: (NOTE) SARS-CoV-2 target nucleic acids are NOT DETECTED. The SARS-CoV-2 RNA is generally detectable in upper and lower respiratory specimens during the acute phase of infection. Negative results Mckenzie not preclude SARS-CoV-2 infection, Mckenzie not rule out co-infections with other pathogens, and should not be used as the sole basis for treatment or other patient management decisions. Negative results must be combined with clinical observations, patient history, and epidemiological information. The expected result is Negative. Fact Sheet for Patients: SugarRoll.be Fact Sheet for Healthcare Providers: https://www.woods-mathews.com/ This test is not yet approved or cleared by the [place] FDA and  has been authorized for detection and/or diagnosis of SARS-CoV-2 by FDA under an Emergency Use Authorization (EUA). This EUA will remain  in effect (meaning this test can be used) for the duration of the COVID-19 declaration under Section 56 4(b)(1) of the Act, 21 U.S.C. section 360bbb-3(b)(1), unless the authorization is terminated or revoked sooner. Performed at [facility], [place] [address], [place], [place] [address]   MRSA PCR Screening     Status: None   Collection Time: 09/12/19  6:50 AM   Specimen: Nasopharyngeal  Result Value Ref Range Status   MRSA by PCR NEGATIVE NEGATIVE Final    Comment:        The GeneXpert MRSA Assay (FDA approved for NASAL specimens only), is one component of a comprehensive MRSA colonization surveillance program. It is not intended to diagnose MRSA infection nor to guide or monitor treatment for MRSA infections. Performed at [facility],  [place] [address] [place], [place] [address]          Radiology Studies: Dg Chest 2 View  Result Date: 09/11/2019 CLINICAL DATA:  Weakness. EXAM: CHEST - 2 VIEW COMPARISON:  May 30, 2019. FINDINGS: The heart size and mediastinal contours are within normal limits. Interval placement of right internal jugular dialysis catheter with distal tip in expected position of right atrium. No pneumothorax or pleural effusion is noted. Mild bibasilar subsegmental atelectasis is noted. The visualized skeletal structures are unremarkable. IMPRESSION: Interval placement of right internal jugular dialysis catheter with distal tip in expected position of right atrium. Mild bibasilar subsegmental atelectasis is noted. Electronically Signed   By: Sabino Dick  Jr M.D.   On: 09/11/2019 15:20   Ct Head Wo Contrast  Result Date: 09/11/2019 CLINICAL DATA:  Patient became dizzy and fell. Possible brief loss of consciousness. Patient hit the back of her head. EXAM: CT HEAD WITHOUT CONTRAST TECHNIQUE: Contiguous axial images were obtained from the base of the skull through the vertex without intravenous contrast. COMPARISON:  05/30/2019 FINDINGS: Brain: No evidence of acute infarction, hemorrhage, hydrocephalus, extra-axial collection or mass lesion/mass effect. There is mild ventricular sulcal enlargement reflecting age related volume loss. Mild periventricular white matter hypoattenuation is also noted consistent with chronic microvascular ischemic change. Vascular: No hyperdense vessel or unexpected calcification. Skull: Normal. Negative for fracture or focal lesion. Sinuses/Orbits: Globes and orbits are unremarkable. The visualized sinuses and mastoid air cells are clear. Other: Small left parietal scalp hematoma. IMPRESSION: 1. No acute intracranial abnormalities. 2. Age related volume loss. Mild chronic microvascular ischemic change. 3. Small left parietal scalp hematoma. Electronically Signed   By: Lajean Manes M.D.   On:  09/11/2019 15:41        Scheduled Meds:  allopurinol  100 mg Oral Daily   atorvastatin  20 mg Oral Daily   carvedilol  6.25 mg Oral BID WC   Chlorhexidine Gluconate Cloth  6 each Topical Q0600   dronabinol  2.5 mg Oral BID AC   escitalopram  20 mg Oral Daily   feeding supplement (PRO-STAT SUGAR FREE 64)  30 mL Oral BID   hydrALAZINE  10 mg Oral Q8H   loperamide  2 mg Oral BID   [START ON 09/14/2019] loperamide  2 mg Oral Daily   methylPREDNISolone (SOLU-MEDROL) injection  60 mg Intravenous Q24H   pantoprazole  40 mg Oral Q0600   Continuous Infusions:  sodium chloride     sodium chloride     sodium chloride     sodium chloride       LOS: 2 days    Time spent: 35 min    Nicolette Bang, Mckenzie [facility]  If 7PM-7AM, please contact night-coverage  09/13/2019, 12:28 PM

## 2019-09-13 NOTE — Consult Note (Addendum)
Consultation Note Date: 09/13/2019   Patient Name: Samantha Mckenzie  DOB: 12/14/1935  MRN: [id number]  Age / Sex: 83 y.o., female  PCP: Colon Branch, MD Referring Physician: Marcell Anger*  Reason for Consultation: Establishing goals of care  HPI/Patient Profile: 83 y.o. female  with past medical history of ESRD HD TTS, breast cancer s/p L mastectomy/chemo/radiation, anemia of chronic disease, Crohn's ileitis, dCHF, CAD, HTN, HLD, GERD, depression admitted on 09/11/2019 with syncope and GIB.   Clinical Assessment and Goals of Care: I met today with [name]. She is lying in bed and has just ambulated with PT and did very well with walking but it having frequent dizziness lying in bed now. She is followed closely by outpatient palliative services with [facility] with RN and CSW and occasionally by NP. She is appreciative of their services.   We discuss her GOC and she expresses readiness "when God decides to take me." She tells me that she would like to continue HD at this time as this does not make her feel any better or worse and does not feel this is greatly impacting her QOL. She does ask about prognosis if she chose to stop HD and I told her that this would likely be 1-2 weeks prognosis. She tells me that she was told 2-3 months. She denies any urine output. We discussed the process of renal failure without dialysis. She also desires continued work up and treatment. She would not desire escalation to ICU level care or any other measures to artificially prolong her life. She agrees with CT abd but was only worried about the diarrhea s/t oral contrast.   Her main concern is having assistance with housekeeping and her son has made a call to [place] (her husband is [place] vet) for assistance. She would like to continue with outpatient palliative care. Continue current level of care and work up but if she were to  decline further she would not desire aggressive measures. She understands her options and recognizes that she is having more bad days than good days and may be getting close to desiring more comfort focused care in the near future.   All questions/concerns addressed. Emotional support provided.   Primary Decision Maker PATIENT    Code Status/Advance Care Planning:  DNR - present at admission   Symptom Management:   Denies pain.   Crohn's/diarrhea: Imodium has been increased.   Dizziness: to get blood in HD today. Discussed reposition to not lie down flat as much here or at home. Encouraged her to sit up and to move slowly when changing positions. Working with PT.   Palliative Prophylaxis:   Bowel Regimen, Delirium Protocol and Frequent Pain Assessment  Additional Recommendations (Limitations, Scope, Preferences):  No Artificial Feeding and No Surgical Procedures  Psycho-social/Spiritual:   Desire for further Chaplaincy support:no  Additional Recommendations: Caregiving  Support/Resources  Prognosis:   < 6 months is very likely. Not eligible for hospice as she desires to continue HD.   Discharge Planning: Home with  Palliative Services      Primary Diagnoses: Present on Admission: . Symptomatic anemia . Macrocytic anemia . Hypokalemia . Essential hypertension . GERD (gastroesophageal reflux disease) . GI bleed . Gout . Depression . HLD (hyperlipidemia)   I have reviewed the medical record, interviewed the patient and family, and examined the patient. The following aspects are pertinent.  Past Medical History:  Diagnosis Date  . Allergic rhinitis   . Anemia   . Breast CA ([place])    surgery, chemo, XRT; had peripheral neuropathy (imbalance at times) after chemo  . Cellulitis    Left arm, recurrent   . Cellulitis LEFT arm recurrent 12/08/2011  . Chronic renal disease, stage IV (HCC)    Dr. Moshe Cipro  . Depression   . Fatigue   . GERD (gastroesophageal  reflux disease)    gastritis, EGD 02/2007  . Gout   . Hyperlipidemia   . Hypertension   . Macular degeneration 2011  . Normal cardiac stress test 12/2014  . Osteoarthritis   . Renal insufficiency   . Syncope 09/12/2019   Social History   Socioeconomic History  . Marital status: Married    Spouse name: Not on file  . Number of children: 2  . Years of education: Not on file  . Highest education level: Not on file  Occupational History  . Occupation: retired     Fish farm manager: RETIRED  Social Needs  . Financial resource strain: Not on file  . Food insecurity    Worry: Not on file    Inability: Not on file  . Transportation needs    Medical: No    Non-medical: No  Tobacco Use  . Smoking status: Never Smoker  . Smokeless tobacco: Never Used  Substance and Sexual Activity  . Alcohol use: Not Currently    Alcohol/week: 0.0 standard drinks    Comment: rarely  . Drug use: No  . Sexual activity: Not Currently  Lifestyle  . Physical activity    Days per week: Not on file    Minutes per session: Not on file  . Stress: Not on file  Relationships  . Social Herbalist on phone: Not on file    Gets together: Not on file    Attends religious service: Not on file    Active member of club or organization: Not on file    Attends meetings of clubs or organizations: Not on file    Relationship status: Not on file  Other Topics Concern  . Not on file  Social History Narrative   Lives w/ husband , 2 children in [place], 2 GK           Family History  Problem Relation Age of Onset  . Breast cancer Other        ? of   . Heart attack Father 3  . Diabetes Sister        2  . Hypertension Sister   . Colon cancer Neg Hx   . Stomach cancer Neg Hx    Scheduled Meds: . allopurinol  100 mg Oral Daily  . atorvastatin  20 mg Oral Daily  . carvedilol  6.25 mg Oral BID WC  . Chlorhexidine Gluconate Cloth  6 each Topical Q0600  . dronabinol  2.5 mg Oral BID AC  . escitalopram  20  mg Oral Daily  . hydrALAZINE  10 mg Oral Q8H  . loperamide  2 mg Oral BID  . [START ON 09/14/2019] loperamide  2  mg Oral Daily  . methylPREDNISolone (SOLU-MEDROL) injection  60 mg Intravenous Q24H  . pantoprazole  40 mg Oral Q0600   Continuous Infusions: . sodium chloride    . sodium chloride    . sodium chloride    . sodium chloride     PRN Meds:.sodium chloride, sodium chloride, sodium chloride, sodium chloride, alteplase, alteplase, heparin, heparin, hydrALAZINE, lidocaine (PF), lidocaine (PF), lidocaine-prilocaine, lidocaine-prilocaine, loperamide, ondansetron **OR** ondansetron (ZOFRAN) IV, pentafluoroprop-tetrafluoroeth, pentafluoroprop-tetrafluoroeth Allergies  Allergen Reactions  . Codeine Nausea And Vomiting  . Morphine Nausea And Vomiting  . Oxycodone-Acetaminophen Nausea And Vomiting  . Sulfonamide Derivatives Nausea And Vomiting  . Tramadol Hcl Nausea And Vomiting   Review of Systems  Constitutional: Positive for activity change, appetite change and fatigue.  Gastrointestinal: Positive for blood in stool and diarrhea.  Neurological: Positive for dizziness.    Physical Exam Vitals signs and nursing note reviewed.  Constitutional:      General: She is not in acute distress.    Appearance: She is ill-appearing.  Cardiovascular:     Rate and Rhythm: Normal rate.  Pulmonary:     Effort: Pulmonary effort is normal. No tachypnea, accessory muscle usage or respiratory distress.  Abdominal:     Palpations: Abdomen is soft.  Neurological:     Mental Status: She is alert and oriented to person, place, and time.     Vital Signs: BP (!) 137/43 (BP Location: Right Leg)   Pulse 70   Temp 98.2 F (36.8 C) (Oral)   Resp 15   Ht _0  (1.575 m)   Wt 52.8 kg   SpO2 100%   BMI 21.29 kg/m  Pain Scale: 0-10   Pain Score: 0-No pain   SpO2: SpO2: 100 % O2 Device:SpO2: 100 % O2 Flow Rate: .   IO: Intake/output summary:   Intake/Output Summary (Last 24 hours) at  09/13/2019 1118 Last data filed at 09/13/2019 0600 Gross per 24 hour  Intake 243 ml  Output 2039 ml  Net -1796 ml    LBM: Last BM Date: 09/12/19 Baseline Weight: Weight: 56.6 kg Most recent weight: Weight: 52.8 kg     Palliative Assessment/Data:     Time In: 1200 Time Out: 1300 Time Total: 60 min Greater than 50%  of this time was spent counseling and coordinating care related to the above assessment and plan.  Signed by: Vinie Sill, NP Palliative Medicine Team Pager # [phone] (M-F 8a-5p) Team Phone # [phone] (Nights/Weekends)

## 2019-09-13 NOTE — Evaluation (Signed)
Occupational Therapy Evaluation Patient Details Name: Samantha Mckenzie MRN: [id number] DOB: 1936-05-13 Today's Date: 09/13/2019    History of Present Illness 83 yo female admitted after syncopal episode at home when she was getting ready for dialysis and fell and hit her head, unsure if LOC, recently admitted in October for L UE and B LE swelling. PMH includes depression, breast cancer s/p left mastectomy/chemo/radiation, anemia of chronic disease, chrohns ileitis, CAD, ESRD recently started on dialysis   Clinical Impression   Pt admitted with syncopal episode. Pt currently with functional limitations due to the deficits listed below (see OT Problem List).  Pt will benefit from skilled OT to increase their safety and independence with ADL and functional mobility for ADL to facilitate discharge to venue listed below.      Follow Up Recommendations  Home health OT;Supervision/Assistance - 24 hour    Equipment Recommendations  3 in 1 bedside commode    Recommendations for Other Services       Precautions / Restrictions Precautions Precautions: Fall Restrictions Weight Bearing Restrictions: No      Mobility Bed Mobility Overal bed mobility: Needs Assistance Bed Mobility: Supine to Sit     Supine to sit: Min assist Sit to supine: Min assist;HOB elevated   General bed mobility comments: pt able to get EOB with increased time and utilization of bed rails, pt required very light min A for LE advancement to return to supine though suspect she could have done on her own with increased time  Transfers Overall transfer level: Needs assistance Equipment used: Rolling walker (2 wheeled) Transfers: Sit to/from [facility] Sit to Stand: Min assist Stand pivot transfers: Min assist       General transfer comment: min guard for safety, cuing for hand placement, pt steady with intial standing    Balance Overall balance assessment: Needs assistance;History of  Falls Sitting-balance support: Single extremity supported;Feet supported Sitting balance-Leahy Scale: Fair Sitting balance - Comments: prefers to have UE support although steady sitting EOB   Standing balance support: Bilateral upper extremity supported;During functional activity Standing balance-Leahy Scale: Fair Standing balance comment: reliant on external support from RW                           ADL either performed or assessed with clinical judgement   ADL Overall ADL's : Needs assistance/impaired Eating/Feeding: Set up;Sitting   Grooming: Set up;Sitting   Upper Body Bathing: Minimal assistance;Sitting   Lower Body Bathing: Moderate assistance;Sit to/from stand;Cueing for compensatory techniques;Cueing for sequencing;Cueing for safety   Upper Body Dressing : Minimal assistance;Sitting   Lower Body Dressing: Moderate assistance;Sit to/from stand;Cueing for safety;Cueing for sequencing   Toilet Transfer: Minimal assistance;Moderate assistance;BSC;Stand-pivot;Cueing for sequencing   Toileting- Clothing Manipulation and Hygiene: Minimal assistance;Sit to/from stand         General ADL Comments: OT spoke with pts son- pt will need A with ADL activity upon DC for safety     Vision Patient Visual Report: No change from baseline              Pertinent Vitals/Pain Pain Assessment: No/denies pain     Hand Dominance     Extremity/Trunk Assessment Upper Extremity Assessment Upper Extremity Assessment: Generalized weakness(lymphedema noted LUE. Educated pt to keep elevated)   Lower Extremity Assessment Lower Extremity Assessment: Generalized weakness(B LE strength grossly 3+/5)       Communication Communication Communication: No difficulties   Cognition Arousal/Alertness: Awake/alert Behavior During  Therapy: WFL for tasks assessed/performed Overall Cognitive Status: Within Functional Limits for tasks assessed                                      General Comments  increased edema in LUE    Exercises     Shoulder Instructions      Home Living Family/patient expects to be discharged to:: Private residence Living Arrangements: Spouse/significant other Available Help at Discharge: Family;Available 24 hours/day Type of Home: House Home Access: Level entry     Home Layout: One level(husband can provide limited physical assist)     Bathroom Shower/Tub: Occupational psychologist: Standard     Home Equipment: Environmental consultant - 2 wheels;Walker - 4 wheels          Prior Functioning/Environment Level of Independence: Independent with assistive device(s)        Comments: uses rollator at home, states that she has been having difficulty getting up from shower chair and getting dressed lately, reports she does not cook anymore        OT Problem List: Decreased strength;Impaired balance (sitting and/or standing);Decreased activity tolerance      OT Treatment/Interventions: Self-care/ADL training;Patient/family education;DME and/or AE instruction;Therapeutic activities    OT Goals(Current goals can be found in the care plan section) Acute Rehab OT Goals Patient Stated Goal: get back to walking more OT Goal Formulation: With patient  OT Frequency: Min 2X/week   Barriers to D/C:            Co-evaluation              AM-PAC OT "6 Clicks" Daily Activity     Outcome Measure Help from another person eating meals?: None Help from another person taking care of personal grooming?: None Help from another person toileting, which includes using toliet, bedpan, or urinal?: A Little Help from another person bathing (including washing, rinsing, drying)?: A Lot Help from another person to put on and taking off regular upper body clothing?: A Little Help from another person to put on and taking off regular lower body clothing?: A Lot 6 Click Score: 18   End of Session Equipment Utilized During Treatment: Rolling  walker;Gait belt Nurse Communication: Mobility status  Activity Tolerance: Patient limited by lethargy Patient left: in chair  OT Visit Diagnosis: Unsteadiness on feet (R26.81)                Time: 9417-4081 OT Time Calculation (min): 22 min Charges:  OT General Charges $OT Visit: 1 Visit OT Evaluation $OT Eval Moderate Complexity: 1 Mod  Kari Baars, OT Acute Rehabilitation Services [phone] Office- [phone]     Kirstan Fentress, Edwena Felty D 09/13/2019, 3:13 PM

## 2019-09-13 NOTE — Progress Notes (Signed)
[facility] Progress Note   Subjective:  Seen in room - no CP or dyspnea. Bloody stools yesterday - plan for 1U PRBCs today. GI following.   Objective Vitals:   09/12/19 1310 09/12/19 1825 09/12/19 2154 09/13/19 0529  BP: (!) 127/57 (!) 164/35 (!) 111/37 (!) 137/43  Pulse: 73 75 75 70  Resp: 15 14 15 15   Temp: 97.7 F (36.5 C) 98.4 F (36.9 C) 98 F (36.7 C) 98.2 F (36.8 C)  TempSrc: Oral Oral Oral Oral  SpO2: 96% 98% 99% 100%  Weight:      Height:       Physical Exam General: Well appearing woman, NAD Heart: RRR; 2/6 murmur Lungs: CTA anteriorly Abdomen: soft, non-tender Extremities: No LE edema, 3+ pitting edema L arm (Hx mastectomy) Dialysis Access: TDC, R AVG + bruit  Additional Objective Labs: Basic Metabolic Panel: Recent Labs  Lab 09/11/19 1222 09/11/19 1722 09/12/19 0812  NA 133*  --  133*  K 3.2*  --  3.2*  CL 94*  --  97*  CO2 19*  --  24  GLUCOSE 97  --  104*  BUN 43*  --  45*  CREATININE 6.43*  --  6.76*  CALCIUM 8.8*  --  8.1*  PHOS  --  2.2* 2.9   Liver Function Tests: Recent Labs  Lab 09/12/19 0812  AST 13*  ALT 21  ALKPHOS 43  BILITOT 0.4  PROT 4.1*  ALBUMIN 1.9*   CBC: Recent Labs  Lab 09/11/19 1222 09/12/19 0812 09/13/19 0146  WBC 9.0 7.4 6.6  HGB 8.5* 7.6* 7.1*  HCT 28.6* 25.9* 23.8*  MCV 111.7* 108.8* 109.7*  PLT 155 171 127*   Iron Studies:  Recent Labs    09/12/19 0812  IRON 17*  TIBC 153*  FERRITIN 361*   Studies/Results: Dg Chest 2 View  Result Date: 09/11/2019 CLINICAL DATA:  Weakness. EXAM: CHEST - 2 VIEW COMPARISON:  May 30, 2019. FINDINGS: The heart size and mediastinal contours are within normal limits. Interval placement of right internal jugular dialysis catheter with distal tip in expected position of right atrium. No pneumothorax or pleural effusion is noted. Mild bibasilar subsegmental atelectasis is noted. The visualized skeletal structures are unremarkable. IMPRESSION: Interval  placement of right internal jugular dialysis catheter with distal tip in expected position of right atrium. Mild bibasilar subsegmental atelectasis is noted. Electronically Signed   By: Marijo Conception M.D.   On: 09/11/2019 15:20   Ct Head Wo Contrast  Result Date: 09/11/2019 CLINICAL DATA:  Patient became dizzy and fell. Possible brief loss of consciousness. Patient hit the back of her head. EXAM: CT HEAD WITHOUT CONTRAST TECHNIQUE: Contiguous axial images were obtained from the base of the skull through the vertex without intravenous contrast. COMPARISON:  05/30/2019 FINDINGS: Brain: No evidence of acute infarction, hemorrhage, hydrocephalus, extra-axial collection or mass lesion/mass effect. There is mild ventricular sulcal enlargement reflecting age related volume loss. Mild periventricular white matter hypoattenuation is also noted consistent with chronic microvascular ischemic change. Vascular: No hyperdense vessel or unexpected calcification. Skull: Normal. Negative for fracture or focal lesion. Sinuses/Orbits: Globes and orbits are unremarkable. The visualized sinuses and mastoid air cells are clear. Other: Small left parietal scalp hematoma. IMPRESSION: 1. No acute intracranial abnormalities. 2. Age related volume loss. Mild chronic microvascular ischemic change. 3. Small left parietal scalp hematoma. Electronically Signed   By: Lajean Manes M.D.   On: 09/11/2019 15:41   Medications: . sodium chloride    .  sodium chloride    . sodium chloride    . sodium chloride     . allopurinol  100 mg Oral Daily  . atorvastatin  20 mg Oral Daily  . carvedilol  6.25 mg Oral BID WC  . Chlorhexidine Gluconate Cloth  6 each Topical Q0600  . dronabinol  2.5 mg Oral BID AC  . escitalopram  20 mg Oral Daily  . hydrALAZINE  10 mg Oral Q8H  . loperamide  2 mg Oral BID  . [START ON 09/14/2019] loperamide  2 mg Oral Daily  . methylPREDNISolone (SOLU-MEDROL) injection  60 mg Intravenous Q24H  . pantoprazole   40 mg Oral Q0600    Dialysis Orders: [facility] TTS  4 hrs EDW 51 kg 2K/2.25 Ca bath F180 BFR 400/ DFR 800 Access R IJ TDC, no heparin R AVG (ready for cannulation this week) - Venofer 50 q week - Mircera 225 mcg q 2 weeks, last given 11/25   Assessment/Plan: 1 Fall: in setting of GIB, CT head negative. Dizziness when turning to R may have element of BPPV to it, monitor, per primary 2.  GI bleed and Crohn's: GI consulted, appreciate assistance.  On Max ESA as OP, last given 11/25. For 1U PRBC today. 3 ESRD: Continue HD per TTS sched - HD today, no heparin. 4 Hypertension: BP controlled - no LE edema. 5. Metabolic Bone Disease: CorrCa/Phos ok. No binders or VDRA 6. Nutrition: Alb exceptionally low - add pro-stat (reports Nepro has caused diarrhea in past)  Veneta Penton, PA-C 09/13/2019, 11:16 AM  [facility] Pager: [phone]

## 2019-09-13 NOTE — Progress Notes (Signed)
Pt states that she is unable to drink the contrast dye for X-ray at this time.

## 2019-09-13 NOTE — Progress Notes (Signed)
Daily Rounding Note  09/13/2019, 10:28 AM  LOS: 2 days   SUBJECTIVE:   Chief complaint: bllod in stools     Had 2 to 3 loose stools w admixed blood yesterday. Drinking contrast for CT scan at present.    OBJECTIVE:         Vital signs in last 24 hours:    Temp:  [97.7 F (36.5 C)-98.4 F (36.9 C)] 98.2 F (36.8 C) (12/03 0529) Pulse Rate:  [62-80] 70 (12/03 0529) Resp:  [12-15] 15 (12/03 0529) BP: (90-164)/(35-57) 137/43 (12/03 0529) SpO2:  [96 %-100 %] 100 % (12/03 0529) Weight:  [52.8 kg] 52.8 kg (12/02 1148) Last BM Date: 09/12/19 Filed Weights   09/12/19 0114 09/12/19 0730 09/12/19 1148  Weight: 56.6 kg 56.8 kg 52.8 kg   General: looks generally unwell with ashen coloring.  Comfortable, not toxic   Heart: RRR Chest: clear bil but BS decreased on right Abdomen: soft, NT, ND.  Active BS  Extremities: no CCE Neuro/Psych:  Oriented x 3.  Appropriate.  No gross deficits, no tremors.    Intake/Output from previous day: 12/02 0701 - 12/03 0700 In: 243 [P.O.:220; I.V.:23] Out: 2039   Intake/Output this shift: No intake/output data recorded.  Lab Results: Recent Labs    09/11/19 1222 09/12/19 0812 09/13/19 0146  WBC 9.0 7.4 6.6  HGB 8.5* 7.6* 7.1*  HCT 28.6* 25.9* 23.8*  PLT 155 171 127*   BMET Recent Labs    09/11/19 1222 09/12/19 0812  NA 133* 133*  K 3.2* 3.2*  CL 94* 97*  CO2 19* 24  GLUCOSE 97 104*  BUN 43* 45*  CREATININE 6.43* 6.76*  CALCIUM 8.8* 8.1*   LFT Recent Labs    09/12/19 0812  PROT 4.1*  ALBUMIN 1.9*  AST 13*  ALT 21  ALKPHOS 43  BILITOT 0.4   PT/INR Recent Labs    09/11/19 1722  LABPROT 13.2  INR 1.0   Hepatitis Panel Recent Labs    09/12/19 1830  HEPBSAG NON REACTIVE    Studies/Results: Dg Chest 2 View  Result Date: 09/11/2019 CLINICAL DATA:  Weakness. EXAM: CHEST - 2 VIEW COMPARISON:  May 30, 2019. FINDINGS: The heart size and mediastinal  contours are within normal limits. Interval placement of right internal jugular dialysis catheter with distal tip in expected position of right atrium. No pneumothorax or pleural effusion is noted. Mild bibasilar subsegmental atelectasis is noted. The visualized skeletal structures are unremarkable. IMPRESSION: Interval placement of right internal jugular dialysis catheter with distal tip in expected position of right atrium. Mild bibasilar subsegmental atelectasis is noted. Electronically Signed   By: Marijo Conception M.D.   On: 09/11/2019 15:20   Ct Head Wo Contrast  Result Date: 09/11/2019 CLINICAL DATA:  Patient became dizzy and fell. Possible brief loss of consciousness. Patient hit the back of her head. EXAM: CT HEAD WITHOUT CONTRAST TECHNIQUE: Contiguous axial images were obtained from the base of the skull through the vertex without intravenous contrast. COMPARISON:  05/30/2019 FINDINGS: Brain: No evidence of acute infarction, hemorrhage, hydrocephalus, extra-axial collection or mass lesion/mass effect. There is mild ventricular sulcal enlargement reflecting age related volume loss. Mild periventricular white matter hypoattenuation is also noted consistent with chronic microvascular ischemic change. Vascular: No hyperdense vessel or unexpected calcification. Skull: Normal. Negative for fracture or focal lesion. Sinuses/Orbits: Globes and orbits are unremarkable. The visualized sinuses and mastoid air cells are clear. Other: Small left parietal scalp  hematoma. IMPRESSION: 1. No acute intracranial abnormalities. 2. Age related volume loss. Mild chronic microvascular ischemic change. 3. Small left parietal scalp hematoma. Electronically Signed   By: Lajean Manes M.D.   On: 09/11/2019 15:41   Scheduled Meds: . allopurinol  100 mg Oral Daily  . atorvastatin  20 mg Oral Daily  . carvedilol  6.25 mg Oral BID WC  . Chlorhexidine Gluconate Cloth  6 each Topical Q0600  . dronabinol  2.5 mg Oral BID AC  .  escitalopram  20 mg Oral Daily  . hydrALAZINE  10 mg Oral Q8H  . methylPREDNISolone (SOLU-MEDROL) injection  60 mg Intravenous Q24H  . pantoprazole  40 mg Oral Q0600   Continuous Infusions: . sodium chloride    . sodium chloride    . sodium chloride    . sodium chloride     PRN Meds:.sodium chloride, sodium chloride, sodium chloride, sodium chloride, alteplase, alteplase, heparin, heparin, hydrALAZINE, lidocaine (PF), lidocaine (PF), lidocaine-prilocaine, lidocaine-prilocaine, loperamide, ondansetron **OR** ondansetron (ZOFRAN) IV, pentafluoroprop-tetrafluoroeth, pentafluoroprop-tetrafluoroeth   ASSESMENT:   *   Bloody stools, chronic.  Ileitis, presumptive Crohn's disease diagnosed within the last 4 months. Has been on prednisone taper, then stable prednisone 20 mg/day but no other IBD meds. GI MD started Solu-Medrol 60 mg/day 12/2  *   Acute on chronic macrocytic anemia. Hgb 8.5 >> 7.1.    *   Dizziness, weakness, fall, left scalp hematoma. Dizziness resolved but little to no OOB activity since admission.    *     ESRD.  Began hemodialysis 07/2019.  *    Thrombocytopenia.  Non-critical.     PLAN   *    CTAP w IV contrast to evaluate ileitis.   *   MD has ordered 1 PRBC to be given at dialysis today.  *    At pt request, adjusted Imodium.  Added standing dose, 2 times today, then 1 x daily starting tmrw.  Still has prn dosing available as well.     *   Renal, 2 liter fluid diet.     Samantha Mckenzie  09/13/2019, 10:28 AM Phone [phone]

## 2019-09-13 NOTE — Progress Notes (Signed)
Pt. back from dialysis

## 2019-09-13 NOTE — Progress Notes (Addendum)
Initial Nutrition Assessment  DOCUMENTATION CODES:   Non-severe (moderate) malnutrition in context of chronic illness  INTERVENTION:  Provide Boost Breeze BID (each supplement provides 250 calories and 9 grams of protein)  Provide Magic cup with meals (each supplement provides 250 calories and 9 grams of protein).  Provide 51m Prostat TID (each supplement provides 100 calories and 15 grams of protein)  Provide Rena-vite daily at bedtime  Liberalize diet to regular  NUTRITION DIAGNOSIS:   Moderate Malnutrition related to chronic illness(ESRD and Crohn's Disease) as evidenced by moderate fat depletion, moderate muscle depletion.  GOAL:   Patient will meet greater than or equal to 90% of their needs  MONITOR:   PO intake, Supplement acceptance, Labs, Weight trends, Skin, I & O's  REASON FOR ASSESSMENT:   Consult Assessment of nutrition requirement/status  ASSESSMENT:   83y.o. female with medical history significant of depression, breast cancer status post left mastectomy/chemotherapy/radiation therapy, anemia of chronic disease, Crohn's ileitis, coronary artery disease, end-stage renal disease-recently started on hemodialysis, GERD, admitted for GI bleed.  Son was present at time of assessment, obtained hx from pt and son.   Pt had HD on 12/2, Net UF 20330m Per nephrology note, pt expresses some thoughts of stopping aggressive medical therapy.  States that she has had a very poor appetite since admission. Admits that she was able to consume about 50% of her pot roast dinner last night but had to "force" down the pancakes that she ordered for this morning and consumed about 30%. .  Pt and son report pt having a poor appetite for as long as a year. Reported that Pt was started on appetite stimulant by her physician in June which seemed to improve appetite. Pt reports to having lost her appetite about one week PTA. States that she has eaten very little at home, and what she  does, she needs to "force" herself to. Pt endorses that she does not take any nutritional supplements such as boost or ensure at home as they give her diarrhea. Pt states that she does take imodium for diarrhea and that it does relieve her symptoms when she takes it. Seemed disinterested in trying ensure while inpatient, but was willing to try Boost Breeze and Prostat. Pt was educated and understands the importance of meeting calorie and protein needs.  Pt reports having loose, bloody stools for approximately 1 month. CT Abdomen scheduled for today with possible colonoscopy pending results.   Pt states that she does take vitamin D prescribed by her physician and also an MVI. Pt and son were educated on renal multivitamins and instructed on how to properly take them at bedtime because of dialysis during the day.   Pt reports her UBW to be 130# and that she has noticed a 25# weight loss over the past 2-3 months. Per documented weight history, pt gained 20# from 06/08/2019 to 08/02/2019 and is now +1.8kg from EDW of 51kg. Moderate subcutaneous fat loss and muscle wasting were identified on exam.   Labs reviewed: sodium; 133; potassium 3.2; choride 97; BUN 45; Creat 6.76; Calcium 8.1, corrected calcium 9.78; albumin 1.9, phosphorus noted to be 2.9, low for HD patient. Medications reviewed: Marinol; Solu Medrol; Protonix; heparin  NUTRITION - FOCUSED PHYSICAL EXAM:    Most Recent Value  Orbital Region  Moderate depletion  Upper Arm Region  Mild depletion  Thoracic and Lumbar Region  No depletion  Buccal Region  Mild depletion  Temple Region  Mild depletion  Clavicle Bone Region  Mild depletion  Clavicle and Acromion Bone Region  Mild depletion  Scapular Bone Region  Mild depletion  Dorsal Hand  Mild depletion  Patellar Region  Moderate depletion  Anterior Thigh Region  Moderate depletion  Posterior Calf Region  Moderate depletion  Edema (RD Assessment)  Mild  Hair  Reviewed  Eyes  Reviewed   Mouth  Reviewed  Skin  Reviewed  Nails  Reviewed       Diet Order:   Diet Order            Diet renal with fluid restriction Fluid restriction: 2000 mL Fluid; Room service appropriate? Yes; Fluid consistency: Thin  Diet effective now              EDUCATION NEEDS:   Education needs have been addressed  Skin:  Skin Assessment: Reviewed RN Assessment  Last BM:  12/2  Height:   Ht Readings from Last 1 Encounters:  09/12/19 5' 2"  (1.575 m)    Weight:   Wt Readings from Last 1 Encounters:  09/12/19 52.8 kg    Ideal Body Weight:  50 kg  BMI:  Body mass index is 21.29 kg/m.  Estimated Nutritional Needs:   Kcal:  1500-1700  Protein:  75-90 grams  Fluid:  1076m + urine output  LMeda Klinefelter Dietetic Intern

## 2019-09-13 NOTE — Evaluation (Signed)
Physical Therapy Evaluation Patient Details Name: Samantha Mckenzie MRN: [id number] DOB: 04/13/1936 Today's Date: 09/13/2019   History of Present Illness  83 yo female admitted after syncopal episode at home when she was getting ready for dialysis and fell and hit her head, unsure if LOC, recently admitted in October for L UE and B LE swelling. PMH includes depression, breast cancer s/p left mastectomy/chemo/radiation, anemia of chronic disease, chrohns ileitis, CAD, ESRD recently started on dialysis  Clinical Impression  Pt in bed upon arrival and agreeable to PT. Pt reports living with spouse in a one level home. Spouse is retired and Engineer, mining but limited in amount of physical assist he can provide. Pt reports having a harder time lately getting dressed, getting up from shower chair and out of bed. Pt required min guard to light min A with functional mobility. Pt reported mod-severe dizziness upon sitting EOB with BP assessed (in supine and sitting) and stable. Pt reported minimal dizziness upon standing. Pt ambulated with RW with slow gait speed reporting she felt very wobbly and unstable. Pt reporting feeling fatigued and of note per chart review potassium 3.2 and hemoglobin 7.1. VSS t/o session.  Pt reports having multiple falls recently and it has been difficult for her to get up off the floor despite help from spouse. Pt presents with deficits in strength, balance, activity tolerance that would benefit from skilled acute PT. Recommendation for home health PT to further improve deficits to decrease fall risk. Pt stated she would think about home health PT.     Follow Up Recommendations Home health PT    Equipment Recommendations  Rolling walker with 5" wheels    Recommendations for Other Services       Precautions / Restrictions Precautions Precautions: Fall Restrictions Weight Bearing Restrictions: No      Mobility  Bed Mobility Overal bed mobility: Needs Assistance Bed  Mobility: Supine to Sit;Sit to Supine     Supine to sit: Supervision;HOB elevated Sit to supine: Min assist;HOB elevated   General bed mobility comments: pt able to get EOB with increased time and utilization of bed rails, pt required very light min A for LE advancement to return to supine though suspect she could have done on her own with increased time  Transfers Overall transfer level: Needs assistance Equipment used: Rolling walker (2 wheeled) Transfers: Sit to/from Stand Sit to Stand: Min guard         General transfer comment: min guard for safety, cuing for hand placement, pt steady with intial standing  Ambulation/Gait Ambulation/Gait assistance: Min guard Gait Distance (Feet): 250 Feet Assistive device: Rolling walker (2 wheeled) Gait Pattern/deviations: Step-through pattern;Drifts right/left;Decreased stride length;Shuffle Gait velocity: decreased   General Gait Details: pt ambulated with RW pushed out in front despite cuing, slow gait speed although improved throughout ambulation distance, pt reported feeling very wobbly although no overt unsteadiness or LOB noted, pt stating that if PT wasnt with her she would have fallen despite therapist only providing min guard assist with hand over gait belt  Stairs            Wheelchair Mobility    Modified Rankin (Stroke Patients Only)       Balance Overall balance assessment: Needs assistance;History of Falls Sitting-balance support: Single extremity supported;Feet supported Sitting balance-Leahy Scale: Fair Sitting balance - Comments: prefers to have UE support although steady sitting EOB   Standing balance support: Bilateral upper extremity supported;During functional activity Standing balance-Leahy Scale: Fair Standing balance comment:  reliant on external support from RW                             Pertinent Vitals/Pain Pain Assessment: No/denies pain    Home Living Family/patient expects to  be discharged to:: Private residence Living Arrangements: Spouse/significant other Available Help at Discharge: Family;Available 24 hours/day Type of Home: House Home Access: Level entry     Home Layout: One level(husband can provide limited physical assist) Home Equipment: Walker - 2 wheels;Walker - 4 wheels      Prior Function Level of Independence: Independent with assistive device(s)         Comments: uses rollator at home, states that she has been having difficulty getting up from shower chair and getting dressed lately, reports she does not cook anymore     Hand Dominance        Extremity/Trunk Assessment   Upper Extremity Assessment Upper Extremity Assessment: Generalized weakness    Lower Extremity Assessment Lower Extremity Assessment: Generalized weakness(B LE strength grossly 3+/5)       Communication   Communication: No difficulties  Cognition Arousal/Alertness: Awake/alert Behavior During Therapy: WFL for tasks assessed/performed Overall Cognitive Status: Within Functional Limits for tasks assessed                                        General Comments General comments (skin integrity, edema, etc.): increased edema in LUE    Exercises     Assessment/Plan    PT Assessment Patient needs continued PT services  PT Problem List Decreased strength;Decreased mobility;Decreased range of motion;Decreased activity tolerance;Decreased balance;Decreased knowledge of use of DME       PT Treatment Interventions DME instruction;Therapeutic exercise;Balance training;Gait training;Stair training;Functional mobility training;Therapeutic activities;Patient/family education    PT Goals (Current goals can be found in the Care Plan section)  Acute Rehab PT Goals Patient Stated Goal: get back to walking more PT Goal Formulation: With patient Time For Goal Achievement: 09/27/19 Potential to Achieve Goals: Good    Frequency Min 3X/week    Barriers to discharge Decreased caregiver support spouse limited in amount of physical assist he can provide    Co-evaluation               AM-PAC PT "6 Clicks" Mobility  Outcome Measure Help needed turning from your back to your side while in a flat bed without using bedrails?: A Little Help needed moving from lying on your back to sitting on the side of a flat bed without using bedrails?: A Little Help needed moving to and from a bed to a chair (including a wheelchair)?: A Little Help needed standing up from a chair using your arms (e.g., wheelchair or bedside chair)?: A Little Help needed to walk in hospital room?: A Little Help needed climbing 3-5 steps with a railing? : A Little 6 Click Score: 18    End of Session Equipment Utilized During Treatment: Gait belt Activity Tolerance: Patient tolerated treatment well Patient left: in bed;with call bell/phone within reach;with nursing/sitter in room Nurse Communication: Mobility status PT Visit Diagnosis: Difficulty in walking, not elsewhere classified (R26.2);Unsteadiness on feet (R26.81);Muscle weakness (generalized) (M62.81)    Time: 8416-6063 PT Time Calculation (min) (ACUTE ONLY): 30 min   Charges:   PT Evaluation $PT Eval Moderate Complexity: 1 Mod PT Treatments $Therapeutic Exercise: 8-22 mins  Zachary George PT, DPT 12:42 PM,09/13/19  Reine Bristow Drucilla Chalet 09/13/2019, 12:35 PM

## 2019-09-14 ENCOUNTER — Inpatient Hospital Stay (HOSPITAL_COMMUNITY): Payer: Medicare Other

## 2019-09-14 DIAGNOSIS — E440 Moderate protein-calorie malnutrition: Secondary | ICD-10-CM | POA: Insufficient documentation

## 2019-09-14 LAB — CBC WITH DIFFERENTIAL/PLATELET
Abs Immature Granulocytes: 0.13 10*3/uL — ABNORMAL HIGH (ref 0.00–0.07)
Basophils Absolute: 0 10*3/uL (ref 0.0–0.1)
Basophils Relative: 0 %
Eosinophils Absolute: 0 10*3/uL (ref 0.0–0.5)
Eosinophils Relative: 0 %
HCT: 27.2 % — ABNORMAL LOW (ref 36.0–46.0)
Hemoglobin: 8.7 g/dL — ABNORMAL LOW (ref 12.0–15.0)
Immature Granulocytes: 2 %
Lymphocytes Relative: 25 %
Lymphs Abs: 1.7 10*3/uL (ref 0.7–4.0)
MCH: 32.5 pg (ref 26.0–34.0)
MCHC: 32 g/dL (ref 30.0–36.0)
MCV: 101.5 fL — ABNORMAL HIGH (ref 80.0–100.0)
Monocytes Absolute: 0.5 10*3/uL (ref 0.1–1.0)
Monocytes Relative: 8 %
Neutro Abs: 4.3 10*3/uL (ref 1.7–7.7)
Neutrophils Relative %: 65 %
Platelets: 110 10*3/uL — ABNORMAL LOW (ref 150–400)
RBC: 2.68 MIL/uL — ABNORMAL LOW (ref 3.87–5.11)
RDW: 21.9 % — ABNORMAL HIGH (ref 11.5–15.5)
WBC: 6.7 10*3/uL (ref 4.0–10.5)
nRBC: 0.4 % — ABNORMAL HIGH (ref 0.0–0.2)

## 2019-09-14 LAB — BASIC METABOLIC PANEL
Anion gap: 7 (ref 5–15)
BUN: 11 mg/dL (ref 8–23)
CO2: 29 mmol/L (ref 22–32)
Calcium: 6.9 mg/dL — ABNORMAL LOW (ref 8.9–10.3)
Chloride: 101 mmol/L (ref 98–111)
Creatinine, Ser: 2.2 mg/dL — ABNORMAL HIGH (ref 0.44–1.00)
GFR calc Af Amer: 24 mL/min — ABNORMAL LOW (ref >60)
GFR calc non Af Amer: 20 mL/min — ABNORMAL LOW (ref >60)
Glucose, Bld: 134 mg/dL — ABNORMAL HIGH (ref 70–99)
Potassium: 3.3 mmol/L — ABNORMAL LOW (ref 3.5–5.1)
Sodium: 137 mmol/L (ref 135–145)

## 2019-09-14 MED ORDER — BISACODYL 10 MG RE SUPP
10.0000 mg | Freq: Once | RECTAL | Status: AC
  Administered 2019-09-15: 06:00:00 10 mg via RECTAL
  Filled 2019-09-14: qty 1

## 2019-09-14 MED ORDER — POTASSIUM CHLORIDE CRYS ER 20 MEQ PO TBCR
40.0000 meq | EXTENDED_RELEASE_TABLET | Freq: Once | ORAL | Status: AC
  Administered 2019-09-14: 14:00:00 40 meq via ORAL
  Filled 2019-09-14: qty 2

## 2019-09-14 MED ORDER — IOHEXOL 300 MG/ML  SOLN
80.0000 mL | Freq: Once | INTRAMUSCULAR | Status: AC | PRN
  Administered 2019-09-14: 13:00:00 80 mL via INTRAVENOUS

## 2019-09-14 NOTE — Progress Notes (Signed)
Patient called this RN to her room stating "my arm feels heavier than earlier, it feels tight and warm and I can not really use my hand" upon reassessment of the patient's left upper extremity I noted pitting edema +3,  cool to the touch and palpable radial and brachial pulses +2. Theresa Duty, MD made aware of these findings via amion. Awaiting further instruction.

## 2019-09-14 NOTE — H&P (View-Only) (Signed)
Daily Rounding Note  09/14/2019, 2:24 PM  LOS: 3 days   SUBJECTIVE:   Chief complaint:  Blood in stools .  SB Crohns dz.   CT delayed by IV access issues.  couple of BM's yesterday and today.  Thinks she has see scant blood.       OBJECTIVE:         Vital signs in last 24 hours:    Temp:  [97.4 F (36.3 C)-98.6 F (37 C)] 98.4 F (36.9 C) (12/04 0508) Pulse Rate:  [60-71] 69 (12/04 1419) Resp:  [12-19] 16 (12/04 1419) BP: (135-172)/(47-57) 162/52 (12/04 1419) SpO2:  [99 %-100 %] 99 % (12/04 1419) Weight:  [53.2 kg-56 kg] 53.2 kg (12/03 1909) Last BM Date: 09/12/19 Filed Weights   09/12/19 1148 09/13/19 1630 09/13/19 1909  Weight: 52.8 kg 56 kg 53.2 kg   General: looks chronically unwell w poor coloring   Heart: RRR Chest: clear bil.   Abdomen: soft,NT, Active BS  Extremities: no CCE Neuro/Psych:  Oriented x 3.  No gross weakness, no tremors.     Intake/Output from previous day: 12/03 0701 - 12/04 0700 In: 315 [Blood:315] Out: 500   Intake/Output this shift: No intake/output data recorded.  Lab Results: Recent Labs    09/12/19 0812 09/12/19 1830 09/13/19 0146 09/14/19 0145  WBC 7.4  --  6.6 6.7  HGB 7.6*  --  7.1* 8.7*  HCT 25.9* 24.2* 23.8* 27.2*  PLT 171  --  127* 110*   BMET Recent Labs    09/12/19 0812 09/14/19 0145  NA 133* 137  K 3.2* 3.3*  CL 97* 101  CO2 24 29  GLUCOSE 104* 134*  BUN 45* 11  CREATININE 6.76* 2.20*  CALCIUM 8.1* 6.9*   LFT Recent Labs    09/12/19 0812  PROT 4.1*  ALBUMIN 1.9*  AST 13*  ALT 21  ALKPHOS 43  BILITOT 0.4   PT/INR Recent Labs    09/11/19 1722  LABPROT 13.2  INR 1.0   Hepatitis Panel Recent Labs    09/12/19 1830  HEPBSAG NON REACTIVE    Studies/Results: Ct Abdomen Pelvis W Contrast  Result Date: 09/14/2019 CLINICAL DATA:  Crohn's disease, rectal bleeding EXAM: CT ABDOMEN AND PELVIS WITH CONTRAST TECHNIQUE: Multidetector CT  imaging of the abdomen and pelvis was performed using the standard protocol following bolus administration of intravenous contrast. CONTRAST:  9m OMNIPAQUE IOHEXOL 300 MG/ML  SOLN COMPARISON:  05/30/2019 FINDINGS: Lower chest: Small bilateral pleural effusions. Associated lower lobe atelectasis. Hepatobiliary: Liver is within normal limits. Gallbladder is unremarkable. No intrahepatic or extrahepatic ductal dilatation. Pancreas: Within normal limits. Spleen: Calcified splenic granulomata. Adrenals/Urinary Tract: Adrenal glands are within normal limits. Bilateral renal cysts, measuring up to 2.3 cm in the lateral right lower kidney. No hydronephrosis. Bladder is underdistended but unremarkable. Stomach/Bowel: Stomach is within normal limits. No evidence of bowel obstruction. Terminal ileum is within normal limits (series 7/image 47). However, a loop of mid/distal ileum in the right lower quadrant demonstrates wall thickening/inflammatory changes (series 7/image 86), suggesting infectious/inflammatory enteritis, likely related to Crohn's disease given the patient's clinical history. Sigmoid diverticulosis, without evidence of diverticulitis. No rectal wall thickening/inflammatory changes. Vascular/Lymphatic: No evidence of abdominal aortic aneurysm. Atherosclerotic calcifications of the abdominal aorta and branch vessels. No suspicious abdominopelvic lymphadenopathy. Reproductive: Uterus is within normal limits. Bilateral ovaries are unremarkable. Other: Trace right pelvic ascites. No drainable fluid collection/abscess.  No free air. Musculoskeletal: Grade 1 anterolisthesis  of L5 on S1. IMPRESSION: Wall thickening/inflammatory changes involving a loop of mid/distal ileum in the right lower quadrant, suggesting infectious/inflammatory enteritis, likely related to active inflammatory Crohn's disease given the patient's clinical history. Trace right pelvic ascites. No drainable fluid collection/abscess. No free air.  Small bilateral pleural effusions with associated lower lobe atelectasis. Electronically Signed   By: Julian Hy M.D.   On: 09/14/2019 13:50   Scheduled Meds: . sodium chloride   Intravenous Once  . allopurinol  100 mg Oral Daily  . atorvastatin  20 mg Oral Daily  . carvedilol  6.25 mg Oral BID WC  . Chlorhexidine Gluconate Cloth  6 each Topical Q0600  . dronabinol  2.5 mg Oral BID AC  . escitalopram  20 mg Oral Daily  . feeding supplement  1 Container Oral TID BM  . feeding supplement (PRO-STAT SUGAR FREE 64)  30 mL Oral BID  . hydrALAZINE  10 mg Oral Q8H  . loperamide  2 mg Oral Daily  . methylPREDNISolone (SOLU-MEDROL) injection  60 mg Intravenous Q24H  . multivitamin  1 tablet Oral QHS  . pantoprazole  40 mg Oral Q0600   Continuous Infusions: . sodium chloride    . sodium chloride     PRN Meds:.sodium chloride, sodium chloride, alteplase, heparin, hydrALAZINE, lidocaine (PF), lidocaine-prilocaine, loperamide, ondansetron **OR** ondansetron (ZOFRAN) IV, pentafluoroprop-tetrafluoroeth   ASSESMENT:    *   Bloody stools, chronic.  Ileitis, presumptive Crohn's disease diagnosed within the last 4 months. Has been on prednisone taper, then stable prednisone 20 mg/day but no other IBD meds. GI MD started Solu-Medrol 60 mg/day 12/2 CT today 12/4 w s/o of active Crohns enteritis.  It is unususal for SB Crohn's to be causing BPR.    *   Acute on chronic macrocytic anemia. Hgb 8.5 >> 7.1 >> 1 PRBC >> 8.7.    *   Dizziness, weakness, fall, left scalp hematoma. Dizziness resolved but little to no OOB activity since admission.    *     ESRD.  Began hemodialysis 07/2019.  *    Thrombocytopenia.  Non-critical.     PLAN   *   Resume renal/2 litre fluid diet.    [name]  09/14/2019, 2:24 PM Phone [phone]

## 2019-09-14 NOTE — Progress Notes (Signed)
Daily Rounding Note  09/14/2019, 2:24 PM  LOS: 3 days   SUBJECTIVE:   Chief complaint:  Blood in stools .  SB Crohns dz.   CT delayed by IV access issues.  couple of BM's yesterday and today.  Thinks she has see scant blood.       OBJECTIVE:         Vital signs in last 24 hours:    Temp:  [97.4 F (36.3 C)-98.6 F (37 C)] 98.4 F (36.9 C) (12/04 0508) Pulse Rate:  [60-71] 69 (12/04 1419) Resp:  [12-19] 16 (12/04 1419) BP: (135-172)/(47-57) 162/52 (12/04 1419) SpO2:  [99 %-100 %] 99 % (12/04 1419) Weight:  [53.2 kg-56 kg] 53.2 kg (12/03 1909) Last BM Date: 09/12/19 Filed Weights   09/12/19 1148 09/13/19 1630 09/13/19 1909  Weight: 52.8 kg 56 kg 53.2 kg   General: looks chronically unwell w poor coloring   Heart: RRR Chest: clear bil.   Abdomen: soft,NT, Active BS  Extremities: no CCE Neuro/Psych:  Oriented x 3.  No gross weakness, no tremors.     Intake/Output from previous day: 12/03 0701 - 12/04 0700 In: 315 [Blood:315] Out: 500   Intake/Output this shift: No intake/output data recorded.  Lab Results: Recent Labs    09/12/19 0812 09/12/19 1830 09/13/19 0146 09/14/19 0145  WBC 7.4  --  6.6 6.7  HGB 7.6*  --  7.1* 8.7*  HCT 25.9* 24.2* 23.8* 27.2*  PLT 171  --  127* 110*   BMET Recent Labs    09/12/19 0812 09/14/19 0145  NA 133* 137  K 3.2* 3.3*  CL 97* 101  CO2 24 29  GLUCOSE 104* 134*  BUN 45* 11  CREATININE 6.76* 2.20*  CALCIUM 8.1* 6.9*   LFT Recent Labs    09/12/19 0812  PROT 4.1*  ALBUMIN 1.9*  AST 13*  ALT 21  ALKPHOS 43  BILITOT 0.4   PT/INR Recent Labs    09/11/19 1722  LABPROT 13.2  INR 1.0   Hepatitis Panel Recent Labs    09/12/19 1830  HEPBSAG NON REACTIVE    Studies/Results: Ct Abdomen Pelvis W Contrast  Result Date: 09/14/2019 CLINICAL DATA:  Crohn's disease, rectal bleeding EXAM: CT ABDOMEN AND PELVIS WITH CONTRAST TECHNIQUE: Multidetector CT  imaging of the abdomen and pelvis was performed using the standard protocol following bolus administration of intravenous contrast. CONTRAST:  43m OMNIPAQUE IOHEXOL 300 MG/ML  SOLN COMPARISON:  05/30/2019 FINDINGS: Lower chest: Small bilateral pleural effusions. Associated lower lobe atelectasis. Hepatobiliary: Liver is within normal limits. Gallbladder is unremarkable. No intrahepatic or extrahepatic ductal dilatation. Pancreas: Within normal limits. Spleen: Calcified splenic granulomata. Adrenals/Urinary Tract: Adrenal glands are within normal limits. Bilateral renal cysts, measuring up to 2.3 cm in the lateral right lower kidney. No hydronephrosis. Bladder is underdistended but unremarkable. Stomach/Bowel: Stomach is within normal limits. No evidence of bowel obstruction. Terminal ileum is within normal limits (series 7/image 47). However, a loop of mid/distal ileum in the right lower quadrant demonstrates wall thickening/inflammatory changes (series 7/image 86), suggesting infectious/inflammatory enteritis, likely related to Crohn's disease given the patient's clinical history. Sigmoid diverticulosis, without evidence of diverticulitis. No rectal wall thickening/inflammatory changes. Vascular/Lymphatic: No evidence of abdominal aortic aneurysm. Atherosclerotic calcifications of the abdominal aorta and branch vessels. No suspicious abdominopelvic lymphadenopathy. Reproductive: Uterus is within normal limits. Bilateral ovaries are unremarkable. Other: Trace right pelvic ascites. No drainable fluid collection/abscess.  No free air. Musculoskeletal: Grade 1 anterolisthesis  of L5 on S1. IMPRESSION: Wall thickening/inflammatory changes involving a loop of mid/distal ileum in the right lower quadrant, suggesting infectious/inflammatory enteritis, likely related to active inflammatory Crohn's disease given the patient's clinical history. Trace right pelvic ascites. No drainable fluid collection/abscess. No free air.  Small bilateral pleural effusions with associated lower lobe atelectasis. Electronically Signed   By: Julian Hy M.D.   On: 09/14/2019 13:50   Scheduled Meds: . sodium chloride   Intravenous Once  . allopurinol  100 mg Oral Daily  . atorvastatin  20 mg Oral Daily  . carvedilol  6.25 mg Oral BID WC  . Chlorhexidine Gluconate Cloth  6 each Topical Q0600  . dronabinol  2.5 mg Oral BID AC  . escitalopram  20 mg Oral Daily  . feeding supplement  1 Container Oral TID BM  . feeding supplement (PRO-STAT SUGAR FREE 64)  30 mL Oral BID  . hydrALAZINE  10 mg Oral Q8H  . loperamide  2 mg Oral Daily  . methylPREDNISolone (SOLU-MEDROL) injection  60 mg Intravenous Q24H  . multivitamin  1 tablet Oral QHS  . pantoprazole  40 mg Oral Q0600   Continuous Infusions: . sodium chloride    . sodium chloride     PRN Meds:.sodium chloride, sodium chloride, alteplase, heparin, hydrALAZINE, lidocaine (PF), lidocaine-prilocaine, loperamide, ondansetron **OR** ondansetron (ZOFRAN) IV, pentafluoroprop-tetrafluoroeth   ASSESMENT:    *   Bloody stools, chronic.  Ileitis, presumptive Crohn's disease diagnosed within the last 4 months. Has been on prednisone taper, then stable prednisone 20 mg/day but no other IBD meds. GI MD started Solu-Medrol 60 mg/day 12/2 CT today 12/4 w s/o of active Crohns enteritis.  It is unususal for SB Crohn's to be causing BPR.    *   Acute on chronic macrocytic anemia. Hgb 8.5 >> 7.1 >> 1 PRBC >> 8.7.    *   Dizziness, weakness, fall, left scalp hematoma. Dizziness resolved but little to no OOB activity since admission.    *     ESRD.  Began hemodialysis 07/2019.  *    Thrombocytopenia.  Non-critical.     PLAN   *   Resume renal/2 litre fluid diet.    [name]  09/14/2019, 2:24 PM Phone [phone]

## 2019-09-14 NOTE — Progress Notes (Signed)
PROGRESS NOTE    Samantha Mckenzie  POE:[id number] DOB: January 18, 1936 DOA: 09/11/2019 PCP: Colon Branch, MD   Brief Narrative:  Samantha Mondry Smithis a 83 y.o.femalewith medical history significant ofdepression, breast cancer status post left mastectomy/chemotherapy/radiation therapy, anemia of chronic disease, Crohn's ileitis, coronary artery disease, end-stage renal disease-recently started on hemodialysis, GERD presents to emergency department after a syncopal episode this morning.  Patient tells me that while she was getting ready for her dialysis appointment she fell on the floor and hit on her head. She found herself on the floor after a few minutes. She is uncertain as to whether she lost consciousness. Denies headache, blurry vision, chest pain, shortness of breath, palpitation, seizures, urinary or bowel incontinence. She tells me that she was dizzy this morning.  Reports bloody stools since 1 month. Reports decreased appetite and weight loss however denies association with nausea, vomiting, epigastric pain, family history of colon cancer, over-the-counter use of NSAIDs or history of hemorrhoids. She tells me that she had loose stool with bright red blood this morning. She had a colonoscopy on 06/06/2019 which showed terminal ileum stricture and diverticulosis in left colon-biopsy came backsuspicious for Crohn's disease and was treated with IV Solu-Medrol and discharged home on 10 weeks of tapering prednisone.  She started on hemodialysis approximately 1 month ago and has been compliant with her appointments however she missed her dialysis appointment this morning due to syncopal episode.  She lives with her husband, denies smoking, alcohol, illicit drug use.  ED Course:On arrival patient's blood pressure noted to be elevated, fecal occult positive, H&H 8.5/28.6, MCV 111.7, potassium: 3.2, chest x-ray negative for acute finding, CT head negative for stroke, COVID-19 pending.EDP  consulted GI for further evaluation and management.   Assessment & Plan:   Principal Problem:   Symptomatic anemia Active Problems:   HLD (hyperlipidemia)   Gout   Macrocytic anemia   Essential hypertension   GERD (gastroesophageal reflux disease)   Depression   Crohn's disease of small intestine with rectal bleeding (HCC)   Hypokalemia   ESRD (end stage renal disease) on dialysis ([place])   GI bleed   DNR (do not resuscitate)   Malnutrition of moderate degree   Symptomatic anemia, macrocytic; multifactorial anemia of chronic renal disease coupled with active Crohn's colitis Recent diagnosis Crohn's inflammatory bowel disease Patient presenting with rest of fatigue and weakness.  Reports continued bloody bowel movements x1 month.  Recently diagnosed with Crohn's colitis from colonoscopy with biopsy 06/06/2019, currently on prednisone 20 mg p.o. daily.  Hemoglobin 8.5 with MCV of 111.7.  Anemia panel with iron 17, TIBC 153, ferritin 361, B12 > 7,500.  Etiology of anemia likely multifactorial with continued active Crohn's disease coupled with anemia of chronic disease from renal disease. --Gastroenterology following, appreciate assistance --CT A/P-unfortunately pt refused to drink contrast yesterday, agreed to drink it this am --on ESA weekly outpatient --On IV Solu-Medrol per GI -Patient was finally able to get CT of the abdomen today; pending results and GI will make a decision on whether or not patient needs another colonoscopy  ESRD on HD Patient dialyzes at [facility] on a TTS schedule.  Has right IJ tunneled dialysis catheter in place.  Has right AV graft in place; not currently cannulized. --Nephrology following, appreciate assistance  Chronic diastolic congestive heart failure TTE 06/07/2017 with EF 5% with grade 1 diastolic dysfunction.  Chest x-ray on admission with no acute cardiopulmonary findings. --continue home carvedilol 6.25 mg p.o. twice daily --Continue monitor  daily  weights and strict I's and O's --Volume management per HD  Essential hypertension BP 137/43this morning --conthome carvedilol 6.25 mg p.o. twice daily and hydralazine 10 mg p.o. every 8 hours --Received HD this morning --Continue monitor blood pressure  Depression: Continue Lexapro  Gout: Continue allopurinol  HLD: Continue statin  GERD: Continue PPI  Moderate protein calorie malnutrition: --Continue Marinol for appetite stimulation --Nutrition consult for supplementation needs  DVT prophylaxis: SCD/Compression stockings  Code Status: DNR    Code Status Orders  (From admission, onward)         Start     Ordered   09/11/19 1633  Do not attempt resuscitation (DNR)  Continuous    Question Answer Comment  In the event of cardiac or respiratory ARREST Do not call a code blue   In the event of cardiac or respiratory ARREST Do not perform Intubation, CPR, defibrillation or ACLS   In the event of cardiac or respiratory ARREST Use medication by any route, position, wound care, and other measures to relive pain and suffering. May use oxygen, suction and manual treatment of airway obstruction as needed for comfort.      09/11/19 1634        Code Status History    Date Active Date Inactive Code Status Order ID Comments User Context   08/03/2019 0240 08/10/2019 1958 DNR [id number]  Vianne Bulls, MD Inpatient   05/30/2019 1303 06/09/2019 1648 DNR [id number]  Kayleen Memos, DO ED   04/20/2019 1257 04/24/2019 1516 DNR [id number]  Alma Friendly, MD ED   04/07/2019 2112 04/12/2019 2150 DNR [id number]  Ivor Costa, MD Inpatient   04/07/2019 2106 04/07/2019 2112 Full Code [id number]  Ivor Costa, MD Inpatient   04/07/2019 1833 04/07/2019 2106 Full Code [id number]  Neena Rhymes, MD ED   03/20/2019 0057 03/27/2019 1445 Full Code [id number]  Etta Quill, DO ED   06/20/2018 2117 06/21/2018 2051 Full Code [id number]  Norval Morton, MD Inpatient   04/19/2013 2050 04/23/2013 1442 Full  Code [id number]  Etta Quill., DO ED   Advance Care Planning Activity    Advance Directive Documentation     Most Recent Value  Type of Advance Directive  Living will  Pre-existing out of facility DNR order (yellow form or pink MOST form)  --  "MOST" Form in Place?  --     Family Communication: Spoke with son who works in [employer] here, answered all questions Disposition Plan:   Patient will remain inpatient, follow-up with GI, further radiographic evaluation with CT, monitoring of hemoglobin and continued steroids.  Patient not safe or stable for discharge Consults called: None Admission status: Inpatient   Consultants:   gastroenterology, nephrology, palliative care  Procedures:  Dg Chest 2 View  Result Date: 09/11/2019 CLINICAL DATA:  Weakness. EXAM: CHEST - 2 VIEW COMPARISON:  May 30, 2019. FINDINGS: The heart size and mediastinal contours are within normal limits. Interval placement of right internal jugular dialysis catheter with distal tip in expected position of right atrium. No pneumothorax or pleural effusion is noted. Mild bibasilar subsegmental atelectasis is noted. The visualized skeletal structures are unremarkable. IMPRESSION: Interval placement of right internal jugular dialysis catheter with distal tip in expected position of right atrium. Mild bibasilar subsegmental atelectasis is noted. Electronically Signed   By: Marijo Conception M.D.   On: 09/11/2019 15:20   Ct Head Wo Contrast  Result Date: 09/11/2019 CLINICAL DATA:  Patient became dizzy and fell. Possible brief loss  of consciousness. Patient hit the back of her head. EXAM: CT HEAD WITHOUT CONTRAST TECHNIQUE: Contiguous axial images were obtained from the base of the skull through the vertex without intravenous contrast. COMPARISON:  05/30/2019 FINDINGS: Brain: No evidence of acute infarction, hemorrhage, hydrocephalus, extra-axial collection or mass lesion/mass effect. There is mild ventricular sulcal  enlargement reflecting age related volume loss. Mild periventricular white matter hypoattenuation is also noted consistent with chronic microvascular ischemic change. Vascular: No hyperdense vessel or unexpected calcification. Skull: Normal. Negative for fracture or focal lesion. Sinuses/Orbits: Globes and orbits are unremarkable. The visualized sinuses and mastoid air cells are clear. Other: Small left parietal scalp hematoma. IMPRESSION: 1. No acute intracranial abnormalities. 2. Age related volume loss. Mild chronic microvascular ischemic change. 3. Small left parietal scalp hematoma. Electronically Signed   By: Lajean Manes M.D.   On: 09/11/2019 15:41   Ct Abdomen Pelvis W Contrast  Result Date: 09/14/2019 CLINICAL DATA:  Crohn's disease, rectal bleeding EXAM: CT ABDOMEN AND PELVIS WITH CONTRAST TECHNIQUE: Multidetector CT imaging of the abdomen and pelvis was performed using the standard protocol following bolus administration of intravenous contrast. CONTRAST:  33m OMNIPAQUE IOHEXOL 300 MG/ML  SOLN COMPARISON:  05/30/2019 FINDINGS: Lower chest: Small bilateral pleural effusions. Associated lower lobe atelectasis. Hepatobiliary: Liver is within normal limits. Gallbladder is unremarkable. No intrahepatic or extrahepatic ductal dilatation. Pancreas: Within normal limits. Spleen: Calcified splenic granulomata. Adrenals/Urinary Tract: Adrenal glands are within normal limits. Bilateral renal cysts, measuring up to 2.3 cm in the lateral right lower kidney. No hydronephrosis. Bladder is underdistended but unremarkable. Stomach/Bowel: Stomach is within normal limits. No evidence of bowel obstruction. Terminal ileum is within normal limits (series 7/image 47). However, a loop of mid/distal ileum in the right lower quadrant demonstrates wall thickening/inflammatory changes (series 7/image 86), suggesting infectious/inflammatory enteritis, likely related to Crohn's disease given the patient's clinical history.  Sigmoid diverticulosis, without evidence of diverticulitis. No rectal wall thickening/inflammatory changes. Vascular/Lymphatic: No evidence of abdominal aortic aneurysm. Atherosclerotic calcifications of the abdominal aorta and branch vessels. No suspicious abdominopelvic lymphadenopathy. Reproductive: Uterus is within normal limits. Bilateral ovaries are unremarkable. Other: Trace right pelvic ascites. No drainable fluid collection/abscess.  No free air. Musculoskeletal: Grade 1 anterolisthesis of L5 on S1. IMPRESSION: Wall thickening/inflammatory changes involving a loop of mid/distal ileum in the right lower quadrant, suggesting infectious/inflammatory enteritis, likely related to active inflammatory Crohn's disease given the patient's clinical history. Trace right pelvic ascites. No drainable fluid collection/abscess. No free air. Small bilateral pleural effusions with associated lower lobe atelectasis. Electronically Signed   By: SJulian HyM.D.   On: 09/14/2019 13:50    Antimicrobials:   None   Subjective: Patient denies any acute complaints overnight or this morning.  She denies any abdominal pain, nausea, vomiting, lightheadedness, dizziness.  Objective: Vitals:   09/13/19 1909 09/13/19 2032 09/14/19 0508 09/14/19 1419  BP: (!) 158/54 (!) 158/48 (!) 137/57 (!) 162/52  Pulse: 60 67 71 69  Resp: 12 19 17 16   Temp: (!) 97.4 F (36.3 C) 98.6 F (37 C) 98.4 F (36.9 C)   TempSrc: Oral Oral Oral   SpO2: 100% 100% 100% 99%  Weight: 53.2 kg     Height:        Intake/Output Summary (Last 24 hours) at 09/14/2019 1438 Last data filed at 09/13/2019 1909 Gross per 24 hour  Intake 315 ml  Output 500 ml  Net -185 ml   Filed Weights   09/12/19 1148 09/13/19 1630 09/13/19 1909  Weight: 52.8  kg 56 kg 53.2 kg    Examination:  General exam: Appears calm and comfortable  Respiratory system: Clear to auscultation. Respiratory effort normal.  Oxygenating well on room  air Cardiovascular system: S1 & S2 heard, RRR. No JVD, murmurs, rubs, gallops or clicks. No pedal edema.  Right IJ dialysis catheter noted, right AV fistula noted Gastrointestinal system: Abdomen is nondistended, soft and nontender. No organomegaly or masses felt. Normal bowel sounds heard. Central nervous system: Alert and oriented. No focal neurological deficits. Extremities: Warm well perfused neurovascularly intact. Skin: No rashes, lesions or ulcers Psychiatry: Judgement and insight appear normal. Mood & affect flat.     Data Reviewed: I have personally reviewed following labs and imaging studies  CBC: Recent Labs  Lab 09/11/19 1222 09/12/19 0812 09/12/19 1830 09/13/19 0146 09/14/19 0145  WBC 9.0 7.4  --  6.6 6.7  NEUTROABS  --   --   --   --  4.3  HGB 8.5* 7.6*  --  7.1* 8.7*  HCT 28.6* 25.9* 24.2* 23.8* 27.2*  MCV 111.7* 108.8*  --  109.7* 101.5*  PLT 155 171  --  127* 466*   Basic Metabolic Panel: Recent Labs  Lab 09/11/19 1222 09/11/19 1722 09/12/19 0812 09/14/19 0145  NA 133*  --  133* 137  K 3.2*  --  3.2* 3.3*  CL 94*  --  97* 101  CO2 19*  --  24 29  GLUCOSE 97  --  104* 134*  BUN 43*  --  45* 11  CREATININE 6.43*  --  6.76* 2.20*  CALCIUM 8.8*  --  8.1* 6.9*  MG  --  1.9  --   --   PHOS  --  2.2* 2.9  --    GFR: Estimated Creatinine Clearance: 15.9 mL/min (A) (by C-G formula based on SCr of 2.2 mg/dL (H)). Liver Function Tests: Recent Labs  Lab 09/12/19 0812  AST 13*  ALT 21  ALKPHOS 43  BILITOT 0.4  PROT 4.1*  ALBUMIN 1.9*   No results for input(s): LIPASE, AMYLASE in the last 168 hours. No results for input(s): AMMONIA in the last 168 hours. Coagulation Profile: Recent Labs  Lab 09/11/19 1722  INR 1.0   Cardiac Enzymes: No results for input(s): CKTOTAL, CKMB, CKMBINDEX, TROPONINI in the last 168 hours. BNP (last 3 results) No results for input(s): PROBNP in the last 8760 hours. HbA1C: No results for input(s): HGBA1C in the last 72  hours. CBG: No results for input(s): GLUCAP in the last 168 hours. Lipid Profile: No results for input(s): CHOL, HDL, LDLCALC, TRIG, CHOLHDL, LDLDIRECT in the last 72 hours. Thyroid Function Tests: Recent Labs    09/11/19 1722  TSH 1.022   Anemia Panel: Recent Labs    09/11/19 1722 09/12/19 0812  VITAMINB12 >7,500*  --   FERRITIN  --  361*  TIBC  --  153*  IRON  --  17*  RETICCTPCT  --  9.2*   Sepsis Labs: Recent Labs  Lab 09/11/19 1722 09/11/19 2000  LATICACIDVEN 1.1 2.6*    Recent Results (from the past 240 hour(s))  SARS CORONAVIRUS 2 (TAT 6-24 HRS) Nasopharyngeal Nasopharyngeal Swab     Status: None   Collection Time: 09/11/19  4:06 PM   Specimen: Nasopharyngeal Swab  Result Value Ref Range Status   SARS Coronavirus 2 NEGATIVE NEGATIVE Final    Comment: (NOTE) SARS-CoV-2 target nucleic acids are NOT DETECTED. The SARS-CoV-2 RNA is generally detectable in upper and lower respiratory specimens  during the acute phase of infection. Negative results do not preclude SARS-CoV-2 infection, do not rule out co-infections with other pathogens, and should not be used as the sole basis for treatment or other patient management decisions. Negative results must be combined with clinical observations, patient history, and epidemiological information. The expected result is Negative. Fact Sheet for Patients: SugarRoll.be Fact Sheet for Healthcare Providers: https://www.woods-mathews.com/ This test is not yet approved or cleared by the [place] FDA and  has been authorized for detection and/or diagnosis of SARS-CoV-2 by FDA under an Emergency Use Authorization (EUA). This EUA will remain  in effect (meaning this test can be used) for the duration of the COVID-19 declaration under Section 56 4(b)(1) of the Act, 21 U.S.C. section 360bbb-3(b)(1), unless the authorization is terminated or revoked sooner. Performed at [facility], [place] [address], [place], [place] [address]   MRSA PCR Screening     Status: None   Collection Time: 09/12/19  6:50 AM   Specimen: Nasopharyngeal  Result Value Ref Range Status   MRSA by PCR NEGATIVE NEGATIVE Final    Comment:        The GeneXpert MRSA Assay (FDA approved for NASAL specimens only), is one component of a comprehensive MRSA colonization surveillance program. It is not intended to diagnose MRSA infection nor to guide or monitor treatment for MRSA infections. Performed at [facility], [place] [address] [place], [place] [address]          Radiology Studies: Ct Abdomen Pelvis W Contrast  Result Date: 09/14/2019 CLINICAL DATA:  Crohn's disease, rectal bleeding EXAM: CT ABDOMEN AND PELVIS WITH CONTRAST TECHNIQUE: Multidetector CT imaging of the abdomen and pelvis was performed using the standard protocol following bolus administration of intravenous contrast. CONTRAST:  83m OMNIPAQUE IOHEXOL 300 MG/ML  SOLN COMPARISON:  05/30/2019 FINDINGS: Lower chest: Small bilateral pleural effusions. Associated lower lobe atelectasis. Hepatobiliary: Liver is within normal limits. Gallbladder is unremarkable. No intrahepatic or extrahepatic ductal dilatation. Pancreas: Within normal limits. Spleen: Calcified splenic granulomata. Adrenals/Urinary Tract: Adrenal glands are within normal limits. Bilateral renal cysts, measuring up to 2.3 cm in the lateral right lower kidney. No hydronephrosis. Bladder is underdistended but unremarkable. Stomach/Bowel: Stomach is within normal limits. No evidence of bowel obstruction. Terminal ileum is within normal limits (series 7/image 47). However, a loop of mid/distal ileum in the right lower quadrant demonstrates wall thickening/inflammatory changes (series 7/image 86), suggesting infectious/inflammatory enteritis, likely related to Crohn's disease given the patient's clinical history. Sigmoid diverticulosis, without evidence of  diverticulitis. No rectal wall thickening/inflammatory changes. Vascular/Lymphatic: No evidence of abdominal aortic aneurysm. Atherosclerotic calcifications of the abdominal aorta and branch vessels. No suspicious abdominopelvic lymphadenopathy. Reproductive: Uterus is within normal limits. Bilateral ovaries are unremarkable. Other: Trace right pelvic ascites. No drainable fluid collection/abscess.  No free air. Musculoskeletal: Grade 1 anterolisthesis of L5 on S1. IMPRESSION: Wall thickening/inflammatory changes involving a loop of mid/distal ileum in the right lower quadrant, suggesting infectious/inflammatory enteritis, likely related to active inflammatory Crohn's disease given the patient's clinical history. Trace right pelvic ascites. No drainable fluid collection/abscess. No free air. Small bilateral pleural effusions with associated lower lobe atelectasis. Electronically Signed   By: SJulian HyM.D.   On: 09/14/2019 13:50        Scheduled Meds:  sodium chloride   Intravenous Once   allopurinol  100 mg Oral Daily   atorvastatin  20 mg Oral Daily   carvedilol  6.25 mg Oral BID WC  Chlorhexidine Gluconate Cloth  6 each Topical Q0600   dronabinol  2.5 mg Oral BID AC   escitalopram  20 mg Oral Daily   feeding supplement  1 Container Oral TID BM   feeding supplement (PRO-STAT SUGAR FREE 64)  30 mL Oral BID   hydrALAZINE  10 mg Oral Q8H   loperamide  2 mg Oral Daily   methylPREDNISolone (SOLU-MEDROL) injection  60 mg Intravenous Q24H   multivitamin  1 tablet Oral QHS   pantoprazole  40 mg Oral Q0600   Continuous Infusions:  sodium chloride     sodium chloride       LOS: 3 days    Time spent: 35 min    Charolotte Capuchin, MD [facility]  If 7PM-7AM, please contact night-coverage  09/14/2019, 2:38 PM

## 2019-09-14 NOTE — Progress Notes (Signed)
[facility]  Samantha Mckenzie is our current palliative pt at home.  [facility] will continue to follow while she is hospitalized to resume services once she is discharged.  Venia Carbon RN, BSN, [facility] Liaison (in [place]) [phone]

## 2019-09-14 NOTE — Progress Notes (Signed)
Physical Therapy Treatment Patient Details Name: Samantha Mckenzie MRN: [id number] DOB: 05/23/1936 Today's Date: 09/14/2019    History of Present Illness 83 yo female admitted after syncopal episode at home when she was getting ready for dialysis and fell and hit her head, unsure if LOC, recently admitted in October for L UE and B LE swelling. PMH includes depression, breast cancer s/p left mastectomy/chemo/radiation, anemia of chronic disease, chrohns ileitis, CAD, ESRD recently started on dialysis    PT Comments    Pt is progressing well towards goals. She reported initial dizziness on rising that improved with mobility. Pt ambulated in hall with min guard for safety and negotiated one step. Will continue to follow acutely for mobility progression.   Follow Up Recommendations  Home health PT     Equipment Recommendations  Rolling walker with 5" wheels    Recommendations for Other Services       Precautions / Restrictions Precautions Precautions: Fall Restrictions Weight Bearing Restrictions: No    Mobility  Bed Mobility Overal bed mobility: Needs Assistance Bed Mobility: Supine to Sit     Supine to sit: Supervision     General bed mobility comments: pt able to come to EOB with HOB elevated and use of bed rails. Increased time and effort required.  Transfers Overall transfer level: Needs assistance Equipment used: Rolling walker (2 wheeled) Transfers: Sit to/from [facility] Sit to Stand: Min guard         General transfer comment: min guard for safety. VC for hand placement with RW.  Ambulation/Gait Ambulation/Gait assistance: Min guard Gait Distance (Feet): 500 Feet Assistive device: Rolling walker (2 wheeled) Gait Pattern/deviations: Step-through pattern Gait velocity: decreased   General Gait Details: Cueing for RW proximity. Pt overall steady with RW and demonstrated good postrual control.    Stairs Stairs: Yes Stairs assistance: Min  guard Stair Management: No rails;Step to pattern;Forwards;With walker Number of Stairs: 1 General stair comments: Pt negotiated curb like step to simulate home enviroment. VC for sequencing and technique. Min guard for safety.   Wheelchair Mobility    Modified Rankin (Stroke Patients Only)       Balance Overall balance assessment: Needs assistance;History of Falls Sitting-balance support: Single extremity supported;Feet supported Sitting balance-Leahy Scale: Fair Sitting balance - Comments: reports dizziness on rising into sitting   Standing balance support: Bilateral upper extremity supported;During functional activity Standing balance-Leahy Scale: Fair Standing balance comment: reliant on external support from RW                            Cognition Arousal/Alertness: Awake/alert Behavior During Therapy: WFL for tasks assessed/performed Overall Cognitive Status: Within Functional Limits for tasks assessed                                        Exercises      General Comments General comments (skin integrity, edema, etc.): increased edema in LUE      Pertinent Vitals/Pain Pain Assessment: No/denies pain    Home Living                      Prior Function            PT Goals (current goals can now be found in the care plan section) Acute Rehab PT Goals Patient Stated Goal: get back to walking more  PT Goal Formulation: With patient Time For Goal Achievement: 09/27/19 Potential to Achieve Goals: Good    Frequency    Min 3X/week      PT Plan Current plan remains appropriate    Co-evaluation              AM-PAC PT "6 Clicks" Mobility   Outcome Measure  Help needed turning from your back to your side while in a flat bed without using bedrails?: None Help needed moving from lying on your back to sitting on the side of a flat bed without using bedrails?: None Help needed moving to and from a bed to a chair  (including a wheelchair)?: A Little Help needed standing up from a chair using your arms (e.g., wheelchair or bedside chair)?: A Little Help needed to walk in hospital room?: A Little Help needed climbing 3-5 steps with a railing? : A Little 6 Click Score: 20    End of Session Equipment Utilized During Treatment: Gait belt Activity Tolerance: Patient tolerated treatment well Patient left: with call bell/phone within reach;in chair Nurse Communication: Mobility status PT Visit Diagnosis: Difficulty in walking, not elsewhere classified (R26.2);Unsteadiness on feet (R26.81);Muscle weakness (generalized) (M62.81)     Time: 4128-7867 PT Time Calculation (min) (ACUTE ONLY): 23 min  Charges:  $Gait Training: 23-37 mins                     Benjiman Core, [place] Pager [phone] Acute Rehab  Allena Katz 09/14/2019, 2:53 PM

## 2019-09-14 NOTE — Progress Notes (Signed)
[facility] Progress Note   Subjective: Awake, alert, very pleasant. Awaiting CT of A/P. L forearm warm to touch, very edematous. Says she is afraid she is getting another infection-apparently H/O swelling/cellulitis in this arm post L mastectomy.   Objective Vitals:   09/13/19 1845 09/13/19 1909 09/13/19 2032 09/14/19 0508  BP: (!) 168/52 (!) 158/54 (!) 158/48 (!) 137/57  Pulse: 60 60 67 71  Resp:  12 19 17   Temp:  (!) 97.4 F (36.3 C) 98.6 F (37 C) 98.4 F (36.9 C)  TempSrc:  Oral Oral Oral  SpO2:  100% 100% 100%  Weight:  53.2 kg    Height:       Physical Exam General: Pleasant elderly female in NAD Heart: S1,S2 RRR Lungs: CTAB Abdomen: S, NT Extremities: No LE Edema. Edema noted LFA, 2+ pitting. Warm to touch. No erythema.  Dialysis Access: RIJ TDC drsg intact. R AVG + bruit.    Additional Objective Labs: Basic Metabolic Panel: Recent Labs  Lab 09/11/19 1222 09/11/19 1722 09/12/19 0812 09/14/19 0145  NA 133*  --  133* 137  K 3.2*  --  3.2* 3.3*  CL 94*  --  97* 101  CO2 19*  --  24 29  GLUCOSE 97  --  104* 134*  BUN 43*  --  45* 11  CREATININE 6.43*  --  6.76* 2.20*  CALCIUM 8.8*  --  8.1* 6.9*  PHOS  --  2.2* 2.9  --    Liver Function Tests: Recent Labs  Lab 09/12/19 0812  AST 13*  ALT 21  ALKPHOS 43  BILITOT 0.4  PROT 4.1*  ALBUMIN 1.9*   No results for input(s): LIPASE, AMYLASE in the last 168 hours. CBC: Recent Labs  Lab 09/11/19 1222 09/12/19 0812 09/12/19 1830 09/13/19 0146 09/14/19 0145  WBC 9.0 7.4  --  6.6 6.7  NEUTROABS  --   --   --   --  4.3  HGB 8.5* 7.6*  --  7.1* 8.7*  HCT 28.6* 25.9* 24.2* 23.8* 27.2*  MCV 111.7* 108.8*  --  109.7* 101.5*  PLT 155 171  --  127* 110*   Blood Culture    Component Value Date/Time   SDES  08/02/2019 2149    BLOOD RIGHT ANTECUBITAL Performed at [facility], [place] [address] [place], [place] [address]    SDES  08/02/2019 2149    BLOOD RIGHT  ANTECUBITAL Performed at [facility], [place] [address] [place], [place] [address]    SPECREQUEST  08/02/2019 2149    BOTTLES DRAWN AEROBIC AND ANAEROBIC Blood Culture adequate volume Performed at [facility] [address], [place], [place] [address]    SPECREQUEST  08/02/2019 2149    BOTTLES DRAWN AEROBIC AND ANAEROBIC Blood Culture results may not be optimal due to an inadequate volume of blood received in culture bottles Performed at [facility], [place] [address], [place], [place] [address]    CULT  08/02/2019 2149    NO GROWTH 5 DAYS Performed at [place] [address] [place], [place] [address]    CULT  08/02/2019 2149    NO GROWTH 5 DAYS Performed at [place] [address] [place], [place] [address]    REPTSTATUS 08/08/2019 FINAL 08/02/2019 2149   REPTSTATUS 08/08/2019 FINAL 08/02/2019 2149    Cardiac Enzymes: No results for input(s): CKTOTAL, CKMB, CKMBINDEX, TROPONINI in the last 168 hours. CBG: No results  for input(s): GLUCAP in the last 168 hours. Iron Studies:  Recent Labs    09/12/19 0812  IRON 17*  TIBC 153*  FERRITIN 361*   @lablastinr3 @ Studies/Results: No results found. Medications: . sodium chloride    . sodium chloride     . sodium chloride   Intravenous Once  . allopurinol  100 mg Oral Daily  . atorvastatin  20 mg Oral Daily  . carvedilol  6.25 mg Oral BID WC  . Chlorhexidine Gluconate Cloth  6 each Topical Q0600  . dronabinol  2.5 mg Oral BID AC  . escitalopram  20 mg Oral Daily  . feeding supplement  1 Container Oral TID BM  . feeding supplement (PRO-STAT SUGAR FREE 64)  30 mL Oral BID  . hydrALAZINE  10 mg Oral Q8H  . loperamide  2 mg Oral Daily  . methylPREDNISolone (SOLU-MEDROL) injection  60 mg Intravenous Q24H  . multivitamin  1 tablet Oral QHS  . pantoprazole  40 mg Oral Q0600  . potassium chloride  40 mEq Oral Once     Dialysis Orders: [facility]   4 hrs EDW 51 kg 2K/2.25 Ca bath F180 BFR 400/ DFR 800 Access R IJ TDC, no heparin R AVG (ready for cannulation this week) - Venofer 50 mg IV q week - Mircera 225 mcg IV q 2 weeks, last given 11/25  Assessment/Plan: 1Fall: in setting of GIB, CT head negative. Dizziness when turning to R may have element of BPPV to it, monitor, per primary 2. GI bleed and Crohn's: GI consulted, appreciate assistance. On Max ESA as OP, last given 11/25. Rec'd 1U PRBC 09/13/19. HGB 8.7 today. 3ESRD: Continue HD per TTS sched - HD 12/05. 4.0 K bath No Heparin.  4Volume/Hypertension: BP controlled - no LE edema. HD 12/03 ran even but post weights lower than pre wt???? Doubt accuracy of wts. UFG 1-2 liters tomorrow.  5. Metabolic Bone Disease:CorrCa/Phos ok. No binders or VDRA 6. Nutrition: Alb exceptionally low - add pro-stat (reports Nepro has caused diarrhea in past) 7. H/O L mastectomy 8. Swelling/Warmth L forearm. Attempted to notify primary. Watch for recurrent cellulitis.   Saren Corkern H. Jerran Tappan NP-C 09/14/2019, 11:57 AM  [employer] [phone]

## 2019-09-15 ENCOUNTER — Telehealth: Payer: Self-pay | Admitting: Nephrology

## 2019-09-15 ENCOUNTER — Inpatient Hospital Stay (HOSPITAL_COMMUNITY): Payer: Medicare Other

## 2019-09-15 ENCOUNTER — Encounter (HOSPITAL_COMMUNITY): Payer: Self-pay | Admitting: *Deleted

## 2019-09-15 ENCOUNTER — Encounter (HOSPITAL_COMMUNITY)
Admission: EM | Disposition: A | Discharge status: Home Health | Payer: Self-pay | Source: Home / Self Care | Attending: Family Medicine

## 2019-09-15 DIAGNOSIS — R55 Syncope and collapse: Secondary | ICD-10-CM

## 2019-09-15 DIAGNOSIS — M7989 Other specified soft tissue disorders: Secondary | ICD-10-CM

## 2019-09-15 DIAGNOSIS — K625 Hemorrhage of anus and rectum: Secondary | ICD-10-CM

## 2019-09-15 HISTORY — PX: FLEXIBLE SIGMOIDOSCOPY: SHX5431

## 2019-09-15 LAB — RENAL FUNCTION PANEL
Albumin: 1.9 g/dL — ABNORMAL LOW (ref 3.5–5.0)
Anion gap: 9 (ref 5–15)
BUN: 34 mg/dL — ABNORMAL HIGH (ref 8–23)
CO2: 23 mmol/L (ref 22–32)
Calcium: 6.7 mg/dL — ABNORMAL LOW (ref 8.9–10.3)
Chloride: 97 mmol/L — ABNORMAL LOW (ref 98–111)
Creatinine, Ser: 4.28 mg/dL — ABNORMAL HIGH (ref 0.44–1.00)
GFR calc Af Amer: 11 mL/min — ABNORMAL LOW (ref >60)
GFR calc non Af Amer: 9 mL/min — ABNORMAL LOW (ref >60)
Glucose, Bld: 148 mg/dL — ABNORMAL HIGH (ref 70–99)
Phosphorus: 3.2 mg/dL (ref 2.5–4.6)
Potassium: 4.9 mmol/L (ref 3.5–5.1)
Sodium: 129 mmol/L — ABNORMAL LOW (ref 135–145)

## 2019-09-15 LAB — BPAM RBC
Blood Product Expiration Date: 202012272359
Blood Product Expiration Date: 202012282359
ISSUE DATE / TIME: 202012031715
Unit Type and Rh: 6200
Unit Type and Rh: 6200

## 2019-09-15 LAB — CBC WITH DIFFERENTIAL/PLATELET
Abs Immature Granulocytes: 0.07 10*3/uL (ref 0.00–0.07)
Basophils Absolute: 0 10*3/uL (ref 0.0–0.1)
Basophils Relative: 0 %
Eosinophils Absolute: 0 10*3/uL (ref 0.0–0.5)
Eosinophils Relative: 0 %
HCT: 29.2 % — ABNORMAL LOW (ref 36.0–46.0)
Hemoglobin: 9.2 g/dL — ABNORMAL LOW (ref 12.0–15.0)
Immature Granulocytes: 1 %
Lymphocytes Relative: 9 %
Lymphs Abs: 0.7 10*3/uL (ref 0.7–4.0)
MCH: 31.7 pg (ref 26.0–34.0)
MCHC: 31.5 g/dL (ref 30.0–36.0)
MCV: 100.7 fL — ABNORMAL HIGH (ref 80.0–100.0)
Monocytes Absolute: 0.2 10*3/uL (ref 0.1–1.0)
Monocytes Relative: 3 %
Neutro Abs: 6.3 10*3/uL (ref 1.7–7.7)
Neutrophils Relative %: 87 %
Platelets: 128 10*3/uL — ABNORMAL LOW (ref 150–400)
RBC: 2.9 MIL/uL — ABNORMAL LOW (ref 3.87–5.11)
RDW: 19.2 % — ABNORMAL HIGH (ref 11.5–15.5)
WBC: 7.2 10*3/uL (ref 4.0–10.5)
nRBC: 0 % (ref 0.0–0.2)

## 2019-09-15 LAB — TYPE AND SCREEN
ABO/RH(D): A POS
Antibody Screen: NEGATIVE
Unit division: 0
Unit division: 0

## 2019-09-15 SURGERY — SIGMOIDOSCOPY, FLEXIBLE
Anesthesia: Moderate Sedation

## 2019-09-15 MED ORDER — MIDAZOLAM HCL (PF) 5 MG/ML IJ SOLN
INTRAMUSCULAR | Status: AC
  Filled 2019-09-15: qty 2

## 2019-09-15 MED ORDER — HEPARIN SODIUM (PORCINE) 1000 UNIT/ML IJ SOLN
INTRAMUSCULAR | Status: AC
  Administered 2019-09-15: 3200 [IU] via INTRAVENOUS_CENTRAL
  Filled 2019-09-15: qty 3

## 2019-09-15 MED ORDER — MIDAZOLAM HCL (PF) 10 MG/2ML IJ SOLN
INTRAMUSCULAR | Status: DC | PRN
  Administered 2019-09-15 (×2): 1 mg via INTRAVENOUS

## 2019-09-15 MED ORDER — SODIUM CHLORIDE 0.9% IV SOLUTION: Freq: Once | INTRAVENOUS | Status: DC

## 2019-09-15 MED ORDER — FENTANYL CITRATE (PF) 100 MCG/2ML IJ SOLN
INTRAMUSCULAR | Status: AC
  Filled 2019-09-15: qty 2

## 2019-09-15 MED ORDER — SODIUM CHLORIDE 0.9 % IV SOLN
INTRAVENOUS | Status: AC | PRN
  Administered 2019-09-15: 500 mL via INTRAVENOUS

## 2019-09-15 MED ORDER — ONDANSETRON HCL 4 MG/2ML IJ SOLN
INTRAMUSCULAR | Status: AC
  Filled 2019-09-15: qty 2

## 2019-09-15 NOTE — Interval H&P Note (Signed)
History and Physical Interval Note:  09/15/2019 7:56 AM  Samantha Mckenzie  has presented today for surgery, with the diagnosis of rectal bleeding.  The various methods of treatment have been discussed with the patient and family. After consideration of risks, benefits and other options for treatment, the patient has consented to  Procedure(s): FLEXIBLE SIGMOIDOSCOPY (N/A) as a surgical intervention.  The patient's history has been reviewed, patient examined, no change in status, stable for surgery.  I have reviewed the patient's chart and labs.  Questions were answered to the patient's satisfaction.     [name]

## 2019-09-15 NOTE — Op Note (Addendum)
[facility] Patient Name: Samantha Mckenzie Procedure Date : 09/15/2019 MRN: [id number] Attending MD: Carlota Raspberry. Havery Moros , MD Date of Birth: [birth year] CSN: [id number] Age: 83 Admit Type: Inpatient Procedure:                Flexible Sigmoidoscopy Indications:              Hematochezia - ileal Crohn's disease on steroids,                            CT shows improvement from prior exam, however                            patient with intermittent rectal bleeding. Patient                            declined bowel prep for full colonoscopy, flex sig                            done to rule out hemorrhoidal bleeding / lower                            tract etiology Providers:                Remo Lipps P. Havery Moros, MD, Vista Lawman, RN, Laverda Sorenson, Technician Referring MD:              Medicines:                Midazolam 2 mg IV Complications:            No immediate complications. Estimated blood loss:                            None. Estimated Blood Loss:     Estimated blood loss: none. Procedure:                Pre-Anesthesia Assessment:                           - Prior to the procedure, a History and Physical                            was performed, and patient medications and                            allergies were reviewed. The patient's tolerance of                            previous anesthesia was also reviewed. The risks                            and benefits of the procedure and the sedation                            options and  risks were discussed with the patient.                            All questions were answered, and informed consent                            was obtained. Prior Anticoagulants: The patient has                            taken no previous anticoagulant or antiplatelet                            agents. ASA Grade Assessment: III - A patient with                            severe systemic disease. After  reviewing the risks                            and benefits, the patient was deemed in                            satisfactory condition to undergo the procedure.                           After obtaining informed consent, the scope was                            passed under direct vision. The PCF-H190DL                            ([id number]) Olympus pediatric colonscope was                            introduced through the anus and advanced to the the                            descending colon. The flexible sigmoidoscopy was                            accomplished without difficulty. The patient                            tolerated the procedure well. The quality of the                            bowel preparation was poor. Scope In: Scope Out: 8:08:36 AM Findings:      The perianal and digital rectal examinations were normal.      A large amount of liquid brown stool was found in the rectum, in the       recto-sigmoid colon, in the sigmoid colon and in the descending colon,       making visualization difficult. Lavage of the area was performed using       copious amounts of sterile water, resulting in incomplete clearance with  fair visualization.      Multiple small-mouthed diverticula were found in the sigmoid colon.      Non-bleeding internal hemorrhoids were found. There was no stigmata of       bleeding from the hemorrhoids - I do not think hemorrhoids are causing       the bleeding      The exam was otherwise without abnormality. No inflammation noted - no       obvious bleeding Impression:               - Preparation of the colon was poor. No obvious                            blood in the colon.                           - Diverticulosis in the sigmoid colon.                           - Non-bleeding internal hemorrhoids.                           - No colonic inflammation                           No clear cause for bleeding noted in the lower                             colon - could be from underlying Crohn's disease as                            previously discussed, symptoms do seem improved on                            IV steroids thus far Moderate Sedation:      Moderate (conscious) sedation was administered by the endoscopy nurse       and supervised by the endoscopist. The patient's oxygen saturation,       heart rate, blood pressure and response to care were monitored. Total       physician intraservice time was 13 minutes. Recommendation:           - Return patient to hospital ward for ongoing care.                           - Resume previous diet.                           - Continue present medications including IV                            solumedrol.                           - Trend Hgb, monitor for recurrent bleeding                           - If the patient has any  significant bleeding                            acutely would recommend CT angio or tagged RBC scan                            for localization, although no active bleeding noted                            on recent CT scan, I do not think the patient has                            bled actively enough for that to be positive                           - If intermittent / low grade bleeding persists she                            will need full colonoscopy. I had discussed this                            with the patient's son yesterday who had wanted to                            proceed with it however the patient declined bowel                            prep and did not want colonoscopy, thus flex sig                            today. Will discuss again goals of care with family                            if bleeding recurs and patient declines colonoscopy.                           - Call with questions Procedure Code(s):        --- Professional ---                           [phone], Sigmoidoscopy, flexible; diagnostic,                            including collection of  specimen(s) by brushing or                            washing, when performed (separate procedure)                           99152, Moderate sedation services provided by the                            same physician or other qualified health care  professional performing the diagnostic or                            therapeutic service that the sedation supports,                            requiring the presence of an independent trained                            observer to assist in the monitoring of the                            patient's level of consciousness and physiological                            status; initial 15 minutes of intraservice time,                            patient age 81 years or older Diagnosis Code(s):        --- Professional ---                           K64.8, Other hemorrhoids                           K92.1, Melena (includes Hematochezia)                           K57.30, Diverticulosis of large intestine without                            perforation or abscess without bleeding CPT copyright 2019 American [employer] Association. All rights reserved. The codes documented in this report are preliminary and upon coder review may  be revised to meet current compliance requirements. Remo Lipps P. Laneah Luft, MD 09/15/2019 8:25:39 AM This report has been signed electronically. Number of Addenda: 1 Addendum Number: 1   Addendum Date: 09/16/2019 10:32:59 AM      Addendum:      I failed to note previously there was a ? possible small anal fissure on       the exam, I don't think enough to cause her anemia to that extent but       will treat with topical niroglycerin Remo Lipps P. Dylanie Quesenberry, MD 09/16/2019 10:33:56 AM This report has been signed electronically.

## 2019-09-15 NOTE — Progress Notes (Signed)
Patient and her son stated that patient is agreeing to a Colonoscopy that was previously declined.  Message sent to Dr. Jerilee Hoh.

## 2019-09-15 NOTE — Progress Notes (Addendum)
[facility] Progress Note   Subjective: Seen on HD. Went to endoscopy for flex sig this AM. No obvious source of bleeding found. Not having a great day, LFA still edematous and "heavy".   Objective Vitals:   09/15/19 0946 09/15/19 1015 09/15/19 1045 09/15/19 1115  BP: (!) 156/45 (!) 164/41 (!) 137/37 (!) 137/33  Pulse: 64 65 66 66  Resp: 16 13 15 16   Temp:      TempSrc:      SpO2:      Weight:      Height:       Physical Exam General: Chronically ill appearing female in NAD Heart: Lungs: Abdomen: Extremities: No LE Edema. Edema LFA 2-3+ warm to touch, no erythema.  Dialysis Access: Using [facility] for HD no issues. R AVG + bruit. Start using next week.   Additional Objective Labs: Basic Metabolic Panel: Recent Labs  Lab 09/11/19 1722 09/12/19 0812 09/14/19 0145 09/15/19 0958  NA  --  133* 137 129*  K  --  3.2* 3.3* 4.9  CL  --  97* 101 97*  CO2  --  24 29 23   GLUCOSE  --  104* 134* 148*  BUN  --  45* 11 34*  CREATININE  --  6.76* 2.20* 4.28*  CALCIUM  --  8.1* 6.9* 6.7*  PHOS 2.2* 2.9  --  3.2   Liver Function Tests: Recent Labs  Lab 09/12/19 0812 09/15/19 0958  AST 13*  --   ALT 21  --   ALKPHOS 43  --   BILITOT 0.4  --   PROT 4.1*  --   ALBUMIN 1.9* 1.9*   No results for input(s): LIPASE, AMYLASE in the last 168 hours. CBC: Recent Labs  Lab 09/11/19 1222 09/12/19 0812  09/13/19 0146 09/14/19 0145 09/15/19 0958  WBC 9.0 7.4  --  6.6 6.7 7.2  NEUTROABS  --   --   --   --  4.3 6.3  HGB 8.5* 7.6*  --  7.1* 8.7* 9.2*  HCT 28.6* 25.9*   < > 23.8* 27.2* 29.2*  MCV 111.7* 108.8*  --  109.7* 101.5* 100.7*  PLT 155 171  --  127* 110* 128*   < > = values in this interval not displayed.   Blood Culture    Component Value Date/Time   SDES  08/02/2019 2149    BLOOD RIGHT ANTECUBITAL Performed at [facility], [place] [address] [place], [place] [address]    SDES  08/02/2019 2149    BLOOD RIGHT ANTECUBITAL Performed at  [facility], [place] [address] [place], [place] [address]    SPECREQUEST  08/02/2019 2149    BOTTLES DRAWN AEROBIC AND ANAEROBIC Blood Culture adequate volume Performed at [place] [address], [place], [place] [address]    SPECREQUEST  08/02/2019 2149    BOTTLES DRAWN AEROBIC AND ANAEROBIC Blood Culture results may not be optimal due to an inadequate volume of blood received in culture bottles Performed at [facility], [place] [address] [place], [place] [address]    CULT  08/02/2019 2149    NO GROWTH 5 DAYS Performed at [facility] [address] [place], [place] [address]    CULT  08/02/2019 2149    NO GROWTH 5 DAYS Performed at [facility] [address], [place],  [address]    REPTSTATUS 08/08/2019 FINAL 08/02/2019 2149   REPTSTATUS 08/08/2019 FINAL 08/02/2019 2149  Cardiac Enzymes: No results for input(s): CKTOTAL, CKMB, CKMBINDEX, TROPONINI in the last 168 hours. CBG: No results for input(s): GLUCAP in the last 168 hours. Iron Studies: No results for input(s): IRON, TIBC, TRANSFERRIN, FERRITIN in the last 72 hours. @lablastinr3 @ Studies/Results: Ct Abdomen Pelvis W Contrast  Result Date: 09/14/2019 CLINICAL DATA:  Crohn's disease, rectal bleeding EXAM: CT ABDOMEN AND PELVIS WITH CONTRAST TECHNIQUE: Multidetector CT imaging of the abdomen and pelvis was performed using the standard protocol following bolus administration of intravenous contrast. CONTRAST:  16m OMNIPAQUE IOHEXOL 300 MG/ML  SOLN COMPARISON:  05/30/2019 FINDINGS: Lower chest: Small bilateral pleural effusions. Associated lower lobe atelectasis. Hepatobiliary: Liver is within normal limits. Gallbladder is unremarkable. No intrahepatic or extrahepatic ductal dilatation. Pancreas: Within normal limits. Spleen: Calcified splenic granulomata. Adrenals/Urinary Tract: Adrenal glands are within normal limits. Bilateral renal cysts,  measuring up to 2.3 cm in the lateral right lower kidney. No hydronephrosis. Bladder is underdistended but unremarkable. Stomach/Bowel: Stomach is within normal limits. No evidence of bowel obstruction. Terminal ileum is within normal limits (series 7/image 47). However, a loop of mid/distal ileum in the right lower quadrant demonstrates wall thickening/inflammatory changes (series 7/image 86), suggesting infectious/inflammatory enteritis, likely related to Crohn's disease given the patient's clinical history. Sigmoid diverticulosis, without evidence of diverticulitis. No rectal wall thickening/inflammatory changes. Vascular/Lymphatic: No evidence of abdominal aortic aneurysm. Atherosclerotic calcifications of the abdominal aorta and branch vessels. No suspicious abdominopelvic lymphadenopathy. Reproductive: Uterus is within normal limits. Bilateral ovaries are unremarkable. Other: Trace right pelvic ascites. No drainable fluid collection/abscess.  No free air. Musculoskeletal: Grade 1 anterolisthesis of L5 on S1. IMPRESSION: Wall thickening/inflammatory changes involving a loop of mid/distal ileum in the right lower quadrant, suggesting infectious/inflammatory enteritis, likely related to active inflammatory Crohn's disease given the patient's clinical history. Trace right pelvic ascites. No drainable fluid collection/abscess. No free air. Small bilateral pleural effusions with associated lower lobe atelectasis. Electronically Signed   By: SJulian HyM.D.   On: 09/14/2019 13:50   Medications: . sodium chloride    . sodium chloride     . sodium chloride   Intravenous Once  . allopurinol  100 mg Oral Daily  . atorvastatin  20 mg Oral Daily  . carvedilol  6.25 mg Oral BID WC  . Chlorhexidine Gluconate Cloth  6 each Topical Q0600  . dronabinol  2.5 mg Oral BID AC  . escitalopram  20 mg Oral Daily  . feeding supplement  1 Container Oral TID BM  . feeding supplement (PRO-STAT SUGAR FREE 64)  30 mL  Oral BID  . heparin      . hydrALAZINE  10 mg Oral Q8H  . loperamide  2 mg Oral Daily  . methylPREDNISolone (SOLU-MEDROL) injection  60 mg Intravenous Q24H  . multivitamin  1 tablet Oral QHS  . pantoprazole  40 mg Oral Q0600     Dialysis Orders: [facility]  4 hrs EDW 51 kg 2K/2.25 Ca bath F180 BFR 400/ DFR 800 Access R IJ TDC, no heparin R AVG (ready for cannulation this week) - Venofer 50 mg IV q week -Mircera 225 mcg IV q 2 weeks, last given 11/25  Assessment/Plan: 1Fall: in setting of GIB, CT head negative.Dizziness when turning to R may have element of BPPV to it, monitor, per primary 2. GI bleed and Crohn's: GI consulted, appreciate assistance. On Max ESA as OP, last given 11/25. Rec'd 1U PRBC 09/13/19. Went for flex sig per GI today. No obvious source of bleeding. GI recommending  further W/U if further bleeding.  HGB 9.2 today. 3ESRD:Continue HD per TTS sched - HD today. K+ 4.9 changed 2.0 K/3.5 Ca bath No Heparin.  4Volume/Hypertension:BP controlled - no LE edema. HD 12/03 ran even but post weights lower than pre wt???? Doubt accuracy of wts. UFG 1-2 liters today.  5. Metabolic Bone Disease:Corr Ca 8.3. Use added Ca bath. Phos ok. No binders or VDRA 6.Nutrition: Alb exceptionally low - add pro-stat (reports Nepro has caused diarrhea in past) 7. H/O L mastectomy 8. Swelling/Warmth L forearm. Attempted to notify primary. Watch for recurrent cellulitis.    H.  NP-C 09/15/2019, 11:33 AM  [employer] [phone]

## 2019-09-15 NOTE — Progress Notes (Addendum)
PROGRESS NOTE    Samantha Mckenzie  CVE:[id number] DOB: [birth year] DOA: 09/11/2019 PCP: Colon Branch, MD     Brief Narrative:  83 year old woman admitted from home on 12/1 due to a syncopal episode.  She was getting ready for dialysis when she fell on the floor.  She has been having bloody stools for about a month.  She had a colonoscopy in August that showed a terminal ileum stricture and diverticulosis in the left colon, biopsies came back suspicious for Crohn's disease she was sent home on 10 weeks of tapering prednisone.  She is a recent start on hemodialysis about 1 month prior to admission.  She has been experiencing hematochezia for the past month.   Assessment & Plan:   Principal Problem:   Symptomatic anemia Active Problems:   HLD (hyperlipidemia)   Gout   Macrocytic anemia   Essential hypertension   GERD (gastroesophageal reflux disease)   Depression   Crohn's disease of small intestine with rectal bleeding (HCC)   Hypokalemia   ESRD (end stage renal disease) on dialysis ([place])   GI bleed   DNR (do not resuscitate)   Malnutrition of moderate degree   Rectal bleeding   Syncopal event -Due to symptomatic anemia.  See below for details.  Symptomatic anemia Acute blood loss anemia due to Crohn's colitis on top of anemia of chronic kidney disease -She has been seen by GI this admission.  She refused full prep for colonoscopy and hence proceeded to flex sigmoidoscopy on 12/5. -No obvious source of bleeding on flex sig today. -GI is recommending either colonoscopy or tagged RBC scan if she continues to have significant bleeding episodes. -Continue IV steroids. -Hemoglobin is 7.2 today, transfuse if less than 7, recheck in a.m.  End-stage renal disease on hemodialysis -Dialyzes on a Tuesday, Thursday, Saturday scheduled. -Nephrology is on board this admission.  Essential hypertension -Blood pressure little elevated today with ranges in the 025E to 527P  systolic. -Suspect will improve after HD today.  Depression -Continue Lexapro, mood appears stable.  Chronic diastolic congestive heart failure -2D echo in August 2018 with ejection fraction of 55% and grade 1 diastolic dysfunction. -Volume management per hemodialysis. -Appears compensated. -Continue Coreg.  Hyperlipidemia -Continue statin  GERD -Continue PPI   DVT prophylaxis: SCDs Code Status: DNR Family Communication: No family at bedside Disposition Plan: Pending resolution of GI bleed, would anticipate discharge home   Consultants:   GI Dr. Enis Gash  Procedures:   Flex sig on 12/5.  Antimicrobials:  Anti-infectives (From admission, onward)   None       Subjective: Seen earlier this a.m. prior to flex sig.  She was doing well with no pain.  Objective: Vitals:   09/15/19 0845 09/15/19 0915 09/15/19 0946 09/15/19 1015  BP: (!) 179/56 (!) 157/45 (!) 156/45 (!) 164/41  Pulse: 61 63 64 65  Resp: 15 16 16 13   Temp:      TempSrc:      SpO2:      Weight:      Height:       No intake or output data in the 24 hours ending 09/15/19 1043 Filed Weights   09/13/19 1630 09/13/19 1909 09/15/19 0500  Weight: 56 kg 53.2 kg 53 kg    Examination:  General exam: Alert, awake, oriented x 3 Respiratory system: Clear to auscultation. Respiratory effort normal. Cardiovascular system:RRR. No murmurs, rubs, gallops. Gastrointestinal system: Abdomen is nondistended, soft and nontender. No organomegaly or masses felt. Normal bowel sounds  heard. Central nervous system: Alert and oriented. No focal neurological deficits. Extremities: No C/C/E, +pedal pulses Skin: No rashes, lesions or ulcers Psychiatry: Judgement and insight appear normal. Mood & affect appropriate.     Data Reviewed: I have personally reviewed following labs and imaging studies  CBC: Recent Labs  Lab 09/11/19 1222 09/12/19 0812 09/12/19 1830 09/13/19 0146 09/14/19 0145 09/15/19 0958  WBC 9.0  7.4  --  6.6 6.7 7.2  NEUTROABS  --   --   --   --  4.3 6.3  HGB 8.5* 7.6*  --  7.1* 8.7* 9.2*  HCT 28.6* 25.9* 24.2* 23.8* 27.2* 29.2*  MCV 111.7* 108.8*  --  109.7* 101.5* 100.7*  PLT 155 171  --  127* 110* 284*   Basic Metabolic Panel: Recent Labs  Lab 09/11/19 1222 09/11/19 1722 09/12/19 0812 09/14/19 0145  NA 133*  --  133* 137  K 3.2*  --  3.2* 3.3*  CL 94*  --  97* 101  CO2 19*  --  24 29  GLUCOSE 97  --  104* 134*  BUN 43*  --  45* 11  CREATININE 6.43*  --  6.76* 2.20*  CALCIUM 8.8*  --  8.1* 6.9*  MG  --  1.9  --   --   PHOS  --  2.2* 2.9  --    GFR: Estimated Creatinine Clearance: 15.9 mL/min (A) (by C-G formula based on SCr of 2.2 mg/dL (H)). Liver Function Tests: Recent Labs  Lab 09/12/19 0812  AST 13*  ALT 21  ALKPHOS 43  BILITOT 0.4  PROT 4.1*  ALBUMIN 1.9*   No results for input(s): LIPASE, AMYLASE in the last 168 hours. No results for input(s): AMMONIA in the last 168 hours. Coagulation Profile: Recent Labs  Lab 09/11/19 1722  INR 1.0   Cardiac Enzymes: No results for input(s): CKTOTAL, CKMB, CKMBINDEX, TROPONINI in the last 168 hours. BNP (last 3 results) No results for input(s): PROBNP in the last 8760 hours. HbA1C: No results for input(s): HGBA1C in the last 72 hours. CBG: No results for input(s): GLUCAP in the last 168 hours. Lipid Profile: No results for input(s): CHOL, HDL, LDLCALC, TRIG, CHOLHDL, LDLDIRECT in the last 72 hours. Thyroid Function Tests: No results for input(s): TSH, T4TOTAL, FREET4, T3FREE, THYROIDAB in the last 72 hours. Anemia Panel: No results for input(s): VITAMINB12, FOLATE, FERRITIN, TIBC, IRON, RETICCTPCT in the last 72 hours. Urine analysis:    Component Value Date/Time   COLORURINE YELLOW 08/02/2019 2254   APPEARANCEUR CLEAR 08/02/2019 2254   LABSPEC 1.010 08/02/2019 2254   PHURINE 5.0 08/02/2019 2254   GLUCOSEU 50 (A) 08/02/2019 2254   HGBUR NEGATIVE 08/02/2019 2254   BILIRUBINUR NEGATIVE 08/02/2019  2254   KETONESUR NEGATIVE 08/02/2019 2254   PROTEINUR NEGATIVE 08/02/2019 2254   UROBILINOGEN 0.2 12/29/2014 1400   NITRITE NEGATIVE 08/02/2019 2254   LEUKOCYTESUR NEGATIVE 08/02/2019 2254   Sepsis Labs: @LABRCNTIP (procalcitonin:4,lacticidven:4)  ) Recent Results (from the past 240 hour(s))  SARS CORONAVIRUS 2 (TAT 6-24 HRS) Nasopharyngeal Nasopharyngeal Swab     Status: None   Collection Time: 09/11/19  4:06 PM   Specimen: Nasopharyngeal Swab  Result Value Ref Range Status   SARS Coronavirus 2 NEGATIVE NEGATIVE Final    Comment: (NOTE) SARS-CoV-2 target nucleic acids are NOT DETECTED. The SARS-CoV-2 RNA is generally detectable in upper and lower respiratory specimens during the acute phase of infection. Negative results do not preclude SARS-CoV-2 infection, do not rule out co-infections with other  pathogens, and should not be used as the sole basis for treatment or other patient management decisions. Negative results must be combined with clinical observations, patient history, and epidemiological information. The expected result is Negative. Fact Sheet for Patients: SugarRoll.be Fact Sheet for Healthcare Providers: https://www.woods-mathews.com/ This test is not yet approved or cleared by the [place] FDA and  has been authorized for detection and/or diagnosis of SARS-CoV-2 by FDA under an Emergency Use Authorization (EUA). This EUA will remain  in effect (meaning this test can be used) for the duration of the COVID-19 declaration under Section 56 4(b)(1) of the Act, 21 U.S.C. section 360bbb-3(b)(1), unless the authorization is terminated or revoked sooner. Performed at [facility], [place] [address] [place], [place] [address]   MRSA PCR Screening     Status: None   Collection Time: 09/12/19  6:50 AM   Specimen: Nasopharyngeal  Result Value Ref Range Status   MRSA by PCR NEGATIVE NEGATIVE Final    Comment:        The  GeneXpert MRSA Assay (FDA approved for NASAL specimens only), is one component of a comprehensive MRSA colonization surveillance program. It is not intended to diagnose MRSA infection nor to guide or monitor treatment for MRSA infections. Performed at [facility], [place] [address], [place], [place] [address]          Radiology Studies: Ct Abdomen Pelvis W Contrast  Result Date: 09/14/2019 CLINICAL DATA:  Crohn's disease, rectal bleeding EXAM: CT ABDOMEN AND PELVIS WITH CONTRAST TECHNIQUE: Multidetector CT imaging of the abdomen and pelvis was performed using the standard protocol following bolus administration of intravenous contrast. CONTRAST:  60m OMNIPAQUE IOHEXOL 300 MG/ML  SOLN COMPARISON:  05/30/2019 FINDINGS: Lower chest: Small bilateral pleural effusions. Associated lower lobe atelectasis. Hepatobiliary: Liver is within normal limits. Gallbladder is unremarkable. No intrahepatic or extrahepatic ductal dilatation. Pancreas: Within normal limits. Spleen: Calcified splenic granulomata. Adrenals/Urinary Tract: Adrenal glands are within normal limits. Bilateral renal cysts, measuring up to 2.3 cm in the lateral right lower kidney. No hydronephrosis. Bladder is underdistended but unremarkable. Stomach/Bowel: Stomach is within normal limits. No evidence of bowel obstruction. Terminal ileum is within normal limits (series 7/image 47). However, a loop of mid/distal ileum in the right lower quadrant demonstrates wall thickening/inflammatory changes (series 7/image 86), suggesting infectious/inflammatory enteritis, likely related to Crohn's disease given the patient's clinical history. Sigmoid diverticulosis, without evidence of diverticulitis. No rectal wall thickening/inflammatory changes. Vascular/Lymphatic: No evidence of abdominal aortic aneurysm. Atherosclerotic calcifications of the abdominal aorta and branch vessels. No suspicious abdominopelvic lymphadenopathy. Reproductive: Uterus is  within normal limits. Bilateral ovaries are unremarkable. Other: Trace right pelvic ascites. No drainable fluid collection/abscess.  No free air. Musculoskeletal: Grade 1 anterolisthesis of L5 on S1. IMPRESSION: Wall thickening/inflammatory changes involving a loop of mid/distal ileum in the right lower quadrant, suggesting infectious/inflammatory enteritis, likely related to active inflammatory Crohn's disease given the patient's clinical history. Trace right pelvic ascites. No drainable fluid collection/abscess. No free air. Small bilateral pleural effusions with associated lower lobe atelectasis. Electronically Signed   By: SJulian HyM.D.   On: 09/14/2019 13:50        Scheduled Meds:  sodium chloride   Intravenous Once   allopurinol  100 mg Oral Daily   atorvastatin  20 mg Oral Daily   carvedilol  6.25 mg Oral BID WC   Chlorhexidine Gluconate Cloth  6 each Topical Q0600   dronabinol  2.5 mg Oral BID AC   escitalopram  20 mg Oral Daily   feeding supplement  1 Container Oral TID BM   feeding supplement (PRO-STAT SUGAR FREE 64)  30 mL Oral BID   hydrALAZINE  10 mg Oral Q8H   loperamide  2 mg Oral Daily   methylPREDNISolone (SOLU-MEDROL) injection  60 mg Intravenous Q24H   multivitamin  1 tablet Oral QHS   pantoprazole  40 mg Oral Q0600   Continuous Infusions:  sodium chloride     sodium chloride       LOS: 4 days    Time spent: 25 minutes. Greater than 50% of this time was spent in direct contact with the patient, coordinating care and discussing relevant ongoing clinical issues.     Lelon Frohlich, MD [facility] Pager [phone]  If 7PM-7AM, please contact night-coverage [email or web address] Password [facility] 09/15/2019, 10:43 AM

## 2019-09-15 NOTE — Progress Notes (Signed)
VASCULAR LAB PRELIMINARY  PRELIMINARY  PRELIMINARY  PRELIMINARY  Left upper extremity venous duplex completed.    Preliminary report:  See CV proc for preliminary results.   Jayliah Benett, RVT 09/15/2019, 5:43 PM

## 2019-09-16 DIAGNOSIS — K922 Gastrointestinal hemorrhage, unspecified: Secondary | ICD-10-CM

## 2019-09-16 DIAGNOSIS — E440 Moderate protein-calorie malnutrition: Secondary | ICD-10-CM

## 2019-09-16 DIAGNOSIS — F320 Major depressive disorder, single episode, mild: Secondary | ICD-10-CM

## 2019-09-16 DIAGNOSIS — R55 Syncope and collapse: Secondary | ICD-10-CM

## 2019-09-16 DIAGNOSIS — E7800 Pure hypercholesterolemia, unspecified: Secondary | ICD-10-CM

## 2019-09-16 LAB — CBC
HCT: 30.1 % — ABNORMAL LOW (ref 36.0–46.0)
Hemoglobin: 9.6 g/dL — ABNORMAL LOW (ref 12.0–15.0)
MCH: 32.4 pg (ref 26.0–34.0)
MCHC: 31.9 g/dL (ref 30.0–36.0)
MCV: 101.7 fL — ABNORMAL HIGH (ref 80.0–100.0)
Platelets: 131 10*3/uL — ABNORMAL LOW (ref 150–400)
RBC: 2.96 MIL/uL — ABNORMAL LOW (ref 3.87–5.11)
RDW: 18.9 % — ABNORMAL HIGH (ref 11.5–15.5)
WBC: 7.9 10*3/uL (ref 4.0–10.5)
nRBC: 0 % (ref 0.0–0.2)

## 2019-09-16 LAB — OCCULT BLOOD X 1 CARD TO LAB, STOOL: Fecal Occult Bld: POSITIVE — AB

## 2019-09-16 LAB — BASIC METABOLIC PANEL
Anion gap: 10 (ref 5–15)
BUN: 18 mg/dL (ref 8–23)
CO2: 26 mmol/L (ref 22–32)
Calcium: 7.5 mg/dL — ABNORMAL LOW (ref 8.9–10.3)
Chloride: 97 mmol/L — ABNORMAL LOW (ref 98–111)
Creatinine, Ser: 2.59 mg/dL — ABNORMAL HIGH (ref 0.44–1.00)
GFR calc Af Amer: 19 mL/min — ABNORMAL LOW (ref >60)
GFR calc non Af Amer: 17 mL/min — ABNORMAL LOW (ref >60)
Glucose, Bld: 216 mg/dL — ABNORMAL HIGH (ref 70–99)
Potassium: 4.7 mmol/L (ref 3.5–5.1)
Sodium: 133 mmol/L — ABNORMAL LOW (ref 135–145)

## 2019-09-16 MED ORDER — PEG-KCL-NACL-NASULF-NA ASC-C 100 G PO SOLR
0.5000 | Freq: Once | ORAL | Status: AC
  Administered 2019-09-17: 100 g via ORAL

## 2019-09-16 MED ORDER — NITROGLYCERIN 2 % TD OINT
0.5000 [in_us] | TOPICAL_OINTMENT | Freq: Two times a day (BID) | TRANSDERMAL | Status: DC
  Administered 2019-09-16 – 2019-09-18 (×3): 0.5 [in_us] via TOPICAL
  Filled 2019-09-16 (×2): qty 30

## 2019-09-16 MED ORDER — PEG-KCL-NACL-NASULF-NA ASC-C 100 G PO SOLR: 1.0000 | Freq: Once | ORAL | Status: DC

## 2019-09-16 MED ORDER — PEG-KCL-NACL-NASULF-NA ASC-C 100 G PO SOLR
0.5000 | Freq: Once | ORAL | Status: AC
  Administered 2019-09-16: 20:00:00 100 g via ORAL
  Filled 2019-09-16: qty 1

## 2019-09-16 NOTE — Progress Notes (Signed)
Progress Note   Subjective  No more bleeding since yesterday, Hgb has been stable, patient without pain in her abdomen / rectum. Have spoken with son and patient at length about options moving forward.   Objective   Vital signs in last 24 hours: Temp:  [97.8 F (36.6 C)-98.9 F (37.2 C)] 98.9 F (37.2 C) (12/06 0513) Pulse Rate:  [63-74] 74 (12/06 0513) Resp:  [12-18] 16 (12/06 0513) BP: (132-184)/(33-63) 147/50 (12/06 0513) SpO2:  [98 %-100 %] 100 % (12/06 0513) Weight:  [52.5 kg] 52.5 kg (12/05 1245) Last BM Date: 09/12/19 General:    white female in NAD Heart:  Regular rate and rhythm; no murmurs Lungs: Respirations even and unlabored,  Abdomen:  Soft, nontender and nondistended.  Extremities:  Without edema. Neurologic:  Alert and oriented,  grossly normal neurologically. Psych:  Cooperative. Normal mood and affect.  Intake/Output from previous day: 12/05 0701 - 12/06 0700 In: -  Out: 1000  Intake/Output this shift: Total I/O In: 120 [P.O.:120] Out: -   Lab Results: Recent Labs    09/14/19 0145 09/15/19 0958 09/16/19 0310  WBC 6.7 7.2 7.9  HGB 8.7* 9.2* 9.6*  HCT 27.2* 29.2* 30.1*  PLT 110* 128* 131*   BMET Recent Labs    09/14/19 0145 09/15/19 0958 09/16/19 0310  NA 137 129* 133*  K 3.3* 4.9 4.7  CL 101 97* 97*  CO2 29 23 26   GLUCOSE 134* 148* 216*  BUN 11 34* 18  CREATININE 2.20* 4.28* 2.59*  CALCIUM 6.9* 6.7* 7.5*   LFT Recent Labs    09/15/19 0958  ALBUMIN 1.9*   PT/INR No results for input(s): LABPROT, INR in the last 72 hours.  Studies/Results: Ct Abdomen Pelvis W Contrast  Result Date: 09/14/2019 CLINICAL DATA:  Crohn's disease, rectal bleeding EXAM: CT ABDOMEN AND PELVIS WITH CONTRAST TECHNIQUE: Multidetector CT imaging of the abdomen and pelvis was performed using the standard protocol following bolus administration of intravenous contrast. CONTRAST:  59m OMNIPAQUE IOHEXOL 300 MG/ML  SOLN COMPARISON:  05/30/2019 FINDINGS:  Lower chest: Small bilateral pleural effusions. Associated lower lobe atelectasis. Hepatobiliary: Liver is within normal limits. Gallbladder is unremarkable. No intrahepatic or extrahepatic ductal dilatation. Pancreas: Within normal limits. Spleen: Calcified splenic granulomata. Adrenals/Urinary Tract: Adrenal glands are within normal limits. Bilateral renal cysts, measuring up to 2.3 cm in the lateral right lower kidney. No hydronephrosis. Bladder is underdistended but unremarkable. Stomach/Bowel: Stomach is within normal limits. No evidence of bowel obstruction. Terminal ileum is within normal limits (series 7/image 47). However, a loop of mid/distal ileum in the right lower quadrant demonstrates wall thickening/inflammatory changes (series 7/image 86), suggesting infectious/inflammatory enteritis, likely related to Crohn's disease given the patient's clinical history. Sigmoid diverticulosis, without evidence of diverticulitis. No rectal wall thickening/inflammatory changes. Vascular/Lymphatic: No evidence of abdominal aortic aneurysm. Atherosclerotic calcifications of the abdominal aorta and branch vessels. No suspicious abdominopelvic lymphadenopathy. Reproductive: Uterus is within normal limits. Bilateral ovaries are unremarkable. Other: Trace right pelvic ascites. No drainable fluid collection/abscess.  No free air. Musculoskeletal: Grade 1 anterolisthesis of L5 on S1. IMPRESSION: Wall thickening/inflammatory changes involving a loop of mid/distal ileum in the right lower quadrant, suggesting infectious/inflammatory enteritis, likely related to active inflammatory Crohn's disease given the patient's clinical history. Trace right pelvic ascites. No drainable fluid collection/abscess. No free air. Small bilateral pleural effusions with associated lower lobe atelectasis. Electronically Signed   By: SJulian HyM.D.   On: 09/14/2019 13:50   Vas [place]oreaUpper Extremity  Venous Duplex  Result Date:  09/15/2019 UPPER VENOUS STUDY  Indications: Swelling Limitations: Edema and poor ultrasound/tissue interface. Comparison Study: Prior LUEV study done 08/04/19 is on file for comparison Performing Technologist: Sharion Dove RVS  Examination Guidelines: A complete evaluation includes B-mode imaging, spectral Doppler, color Doppler, and power Doppler as needed of all accessible portions of each vessel. Bilateral testing is considered an integral part of a complete examination. Limited examinations for reoccurring indications may be performed as noted.  Right Findings: +----------+------------+---------+-----------+----------+-------+  RIGHT      Compressible Phasicity Spontaneous Properties Summary  +----------+------------+---------+-----------+----------+-------+  Subclavian                 Yes        Yes                         +----------+------------+---------+-----------+----------+-------+  Left Findings: +----------+------------+---------+-----------+----------+--------------+  LEFT       Compressible Phasicity Spontaneous Properties    Summary      +----------+------------+---------+-----------+----------+--------------+  IJV            Full        Yes        Yes                                +----------+------------+---------+-----------+----------+--------------+  Subclavian                 Yes        Yes                                +----------+------------+---------+-----------+----------+--------------+  Axillary                   Yes        Yes                                +----------+------------+---------+-----------+----------+--------------+  Brachial       Full        Yes        Yes                                +----------+------------+---------+-----------+----------+--------------+  Radial         Full                                                      +----------+------------+---------+-----------+----------+--------------+  Ulnar                                                    Not  visualized  +----------+------------+---------+-----------+----------+--------------+  Cephalic       Full                                                      +----------+------------+---------+-----------+----------+--------------+  Basilic                                                  Not visualized  +----------+------------+---------+-----------+----------+--------------+ Interstitial fluid noted throughout  Summary:  Right: No evidence of thrombosis in the subclavian.  Left: No evidence of deep vein thrombosis in the upper extremity. However, unable to visualize the Ulnar and basilic veins. No evidence of superficial vein thrombosis in the upper extremity. This was a limited study. No change since prior study done 08/04/19.  *See table(s) above for measurements and observations.  Diagnosing physician: Deitra Mayo MD Electronically signed by Deitra Mayo MD on 09/15/2019 at 11:09:17 PM.    Final        Assessment / Plan:    83 y/o female with ESRD on HD, presumptive Crohn's disease diagnosed earlier this summer, who presenting with worsening anemia and ongoing rectal bleeding.   Rectal bleeding not new however worsening anemia needing transfusions. She had been on steroids with improvement of her pain but not bleeding, prednisone had been tapered to 45m prior to admission. Repeated CT scan does not show IC valve stricture (previously noted on last colonoscopy) and ileum looks improved compared to previous, but still some active areas of suspected active Crohn's, she is clearly not in remission.   I had recommended colonoscopy previously, patient felt she could not do prep previously, we did a flex sig yesterday, prep was poor, nothing obviously noted other than a very small appearing ? fissure which I'm not convinced is causing all of this, she has no pain from the site either, although will treat with topical nitroglycerin in case related in any way.  Have had a good conversation  with the patient's son and patient this AM. Colonoscopy / EGD is the best way to evaluate her bowel for this issue, try to look into the ileum if we can now that inflammation is better controlled and confirm where this is coming from given her worsening anemia / transfusion needs, as she only has bleeding and her other Crohn's symptoms have resolved, a bit atypical to have solely bleeding from ileal Crohn's disease without other symptoms. After discussion of procedures / options / risks / benefits, son strongly wants to proceed and patient wants to proceed with these exams tomorrow if possible. Clear liquid diet today, prep tonight, hopefully to have procedures done tomorrow with Dr. MRush Landmark   Continue IV solumedrol today as done, may taper tomorrow pending endoscopic appearance back to oral prednisone.   SCarolina Cellar MD [facility]

## 2019-09-16 NOTE — Progress Notes (Addendum)
[facility] Progress Note   Subjective: She feels much better today. Agrees to colonoscopy and willing to start using AVG next HD treatment.      Objective Vitals:   09/15/19 1335 09/15/19 1405 09/15/19 2206 09/16/19 0513  BP: (!) 147/44 (!) 159/63 (!) 132/38 (!) 147/50  Pulse: 73 71 69 74  Resp: 18 16 17 16   Temp: 98 F (36.7 C) 98 F (36.7 C) 98.6 F (37 C) 98.9 F (37.2 C)  TempSrc: Oral Oral Oral Oral  SpO2: 100% 99% 98% 100%  Weight:      Height:       Physical Exam General: Chronically ill appearing female in NAD Heart: S1,S2 RRR Lungs: CTAB Abdomen: Soft, NT Extremities: No LE Edema. Edema LFA 2-3+ warm to touch, no erythema.  Dialysis Access: R[facility] drsg CDI. R AVG + bruit.   Additional Objective Labs: Basic Metabolic Panel: Recent Labs  Lab 09/11/19 1722 09/12/19 0812 09/14/19 0145 09/15/19 0958 09/16/19 0310  NA  --  133* 137 129* 133*  K  --  3.2* 3.3* 4.9 4.7  CL  --  97* 101 97* 97*  CO2  --  24 29 23 26   GLUCOSE  --  104* 134* 148* 216*  BUN  --  45* 11 34* 18  CREATININE  --  6.76* 2.20* 4.28* 2.59*  CALCIUM  --  8.1* 6.9* 6.7* 7.5*  PHOS 2.2* 2.9  --  3.2  --    Liver Function Tests: Recent Labs  Lab 09/12/19 0812 09/15/19 0958  AST 13*  --   ALT 21  --   ALKPHOS 43  --   BILITOT 0.4  --   PROT 4.1*  --   ALBUMIN 1.9* 1.9*   No results for input(s): LIPASE, AMYLASE in the last 168 hours. CBC: Recent Labs  Lab 09/12/19 0812  09/13/19 0146 09/14/19 0145 09/15/19 0958 09/16/19 0310  WBC 7.4  --  6.6 6.7 7.2 7.9  NEUTROABS  --   --   --  4.3 6.3  --   HGB 7.6*  --  7.1* 8.7* 9.2* 9.6*  HCT 25.9*   < > 23.8* 27.2* 29.2* 30.1*  MCV 108.8*  --  109.7* 101.5* 100.7* 101.7*  PLT 171  --  127* 110* 128* 131*   < > = values in this interval not displayed.   Blood Culture    Component Value Date/Time   SDES  08/02/2019 2149    BLOOD RIGHT ANTECUBITAL Performed at [facility], [place] [address], [place], [place] [address]    SDES  08/02/2019 2149    BLOOD RIGHT ANTECUBITAL Performed at [facility], [place] [address], [place], [place] [address]    SPECREQUEST  08/02/2019 2149    BOTTLES DRAWN AEROBIC AND ANAEROBIC Blood Culture adequate volume Performed at [place] [address], [place], [place] [address]    SPECREQUEST  08/02/2019 2149    BOTTLES DRAWN AEROBIC AND ANAEROBIC Blood Culture results may not be optimal due to an inadequate volume of blood received in culture bottles Performed at [facility], [place] [address], [place], [place] [address]    CULT  08/02/2019 2149    NO GROWTH 5 DAYS Performed at [place] [address], [place],  [address]    CULT  08/02/2019 2149    NO GROWTH 5 DAYS Performed at [place] [address], [place], [place]  [place] 08/08/2019 FINAL 08/02/2019 2149   REPTSTATUS 08/08/2019 FINAL 08/02/2019 2149    Cardiac Enzymes: No results for input(s): CKTOTAL, CKMB, CKMBINDEX, TROPONINI in the last 168 hours. CBG: No results for input(s): GLUCAP in the last 168 hours. Iron Studies: No results for input(s): IRON, TIBC, TRANSFERRIN, FERRITIN in the last 72 hours. @lablastinr3 @ Studies/Results: Ct Abdomen Pelvis W Contrast  Result Date: 09/14/2019 CLINICAL DATA:  Crohn's disease, rectal bleeding EXAM: CT ABDOMEN AND PELVIS WITH CONTRAST TECHNIQUE: Multidetector CT imaging of the abdomen and pelvis was performed using the standard protocol following bolus administration of intravenous contrast. CONTRAST:  18m OMNIPAQUE IOHEXOL 300 MG/ML  SOLN COMPARISON:  05/30/2019 FINDINGS: Lower chest: Small bilateral pleural effusions. Associated lower lobe atelectasis. Hepatobiliary: Liver is within normal limits. Gallbladder is unremarkable. No intrahepatic or extrahepatic ductal dilatation. Pancreas: Within normal limits. Spleen: Calcified splenic  granulomata. Adrenals/Urinary Tract: Adrenal glands are within normal limits. Bilateral renal cysts, measuring up to 2.3 cm in the lateral right lower kidney. No hydronephrosis. Bladder is underdistended but unremarkable. Stomach/Bowel: Stomach is within normal limits. No evidence of bowel obstruction. Terminal ileum is within normal limits (series 7/image 47). However, a loop of mid/distal ileum in the right lower quadrant demonstrates wall thickening/inflammatory changes (series 7/image 86), suggesting infectious/inflammatory enteritis, likely related to Crohn's disease given the patient's clinical history. Sigmoid diverticulosis, without evidence of diverticulitis. No rectal wall thickening/inflammatory changes. Vascular/Lymphatic: No evidence of abdominal aortic aneurysm. Atherosclerotic calcifications of the abdominal aorta and branch vessels. No suspicious abdominopelvic lymphadenopathy. Reproductive: Uterus is within normal limits. Bilateral ovaries are unremarkable. Other: Trace right pelvic ascites. No drainable fluid collection/abscess.  No free air. Musculoskeletal: Grade 1 anterolisthesis of L5 on S1. IMPRESSION: Wall thickening/inflammatory changes involving a loop of mid/distal ileum in the right lower quadrant, suggesting infectious/inflammatory enteritis, likely related to active inflammatory Crohn's disease given the patient's clinical history. Trace right pelvic ascites. No drainable fluid collection/abscess. No free air. Small bilateral pleural effusions with associated lower lobe atelectasis. Electronically Signed   By: SJulian HyM.D.   On: 09/14/2019 13:50   Vas UKoreaUpper Extremity Venous Duplex  Result Date: 09/15/2019 UPPER VENOUS STUDY  Indications: Swelling Limitations: Edema and poor ultrasound/tissue interface. Comparison Study: Prior LUEV study done 08/04/19 is on file for comparison Performing Technologist: CSharion DoveRVS  Examination Guidelines: A complete evaluation  includes B-mode imaging, spectral Doppler, color Doppler, and power Doppler as needed of all accessible portions of each vessel. Bilateral testing is considered an integral part of a complete examination. Limited examinations for reoccurring indications may be performed as noted.  Right Findings: +----------+------------+---------+-----------+----------+-------+ RIGHT     CompressiblePhasicitySpontaneousPropertiesSummary +----------+------------+---------+-----------+----------+-------+ Subclavian               Yes       Yes                      +----------+------------+---------+-----------+----------+-------+  Left Findings: +----------+------------+---------+-----------+----------+--------------+ LEFT      CompressiblePhasicitySpontaneousProperties   Summary     +----------+------------+---------+-----------+----------+--------------+ IJV           Full       Yes       Yes                             +----------+------------+---------+-----------+----------+--------------+ Subclavian               Yes       Yes                             +----------+------------+---------+-----------+----------+--------------+  Axillary                 Yes       Yes                             +----------+------------+---------+-----------+----------+--------------+ Brachial      Full       Yes       Yes                             +----------+------------+---------+-----------+----------+--------------+ Radial        Full                                                 +----------+------------+---------+-----------+----------+--------------+ Ulnar                                               Not visualized +----------+------------+---------+-----------+----------+--------------+ Cephalic      Full                                                 +----------+------------+---------+-----------+----------+--------------+ Basilic                                              Not visualized +----------+------------+---------+-----------+----------+--------------+ Interstitial fluid noted throughout  Summary:  Right: No evidence of thrombosis in the subclavian.  Left: No evidence of deep vein thrombosis in the upper extremity. However, unable to visualize the Ulnar and basilic veins. No evidence of superficial vein thrombosis in the upper extremity. This was a limited study. No change since prior study done 08/04/19.  *See table(s) above for measurements and observations.  Diagnosing physician: Deitra Mayo MD Electronically signed by Deitra Mayo MD on 09/15/2019 at 11:09:17 PM.    Final    Medications: . sodium chloride    . sodium chloride     . sodium chloride   Intravenous Once  . sodium chloride   Intravenous Once  . allopurinol  100 mg Oral Daily  . atorvastatin  20 mg Oral Daily  . carvedilol  6.25 mg Oral BID WC  . Chlorhexidine Gluconate Cloth  6 each Topical Q0600  . dronabinol  2.5 mg Oral BID AC  . escitalopram  20 mg Oral Daily  . feeding supplement  1 Container Oral TID BM  . feeding supplement (PRO-STAT SUGAR FREE 64)  30 mL Oral BID  . hydrALAZINE  10 mg Oral Q8H  . loperamide  2 mg Oral Daily  . methylPREDNISolone (SOLU-MEDROL) injection  60 mg Intravenous Q24H  . multivitamin  1 tablet Oral QHS  . nitroGLYCERIN  0.5 inch Topical Q12H  . pantoprazole  40 mg Oral Q0600  . peg 3350 powder  1 kit Oral Once     Dialysis Orders: [facility]  4 hrs EDW 51 kg 2K/2.25 Ca bath F180 BFR 400/ DFR 800 Access R IJ TDC, no heparin  R AVG (ready for cannulation this week) - Venofer 31m IVq week -Mircera 225 mcgIVq 2 weeks, last given 11/25  Assessment/Plan: 1Fall: in setting of GIB, CT head negative.Dizziness when turning to R may have element of BPPV to it, monitor, per primary.  2. GI bleed and Crohn's: GI consulted, appreciate assistance. On Max ESA as OP, last given 11/25.Rec'd1U PRBC12/03/20. Went for  flex sig per GI today. No obvious source of bleeding. GI recommending colonoscopy, possibly tomorrow.  HGB 9.6 today. 3ESRD:Continue HD per TTS sched -Next HD 09/18/2019. Changed to 2.0 K/3.5 Ca bath No Heparin.Agrees to use AVG next HD, hopefully at OP center.  4Volume/Hypertension:BP controlled - no LE edema.HD 12/05 Net UF 1.0 L Post wt 52.5 kg. Still above OP EDW but volume status seems fine, BP controlled. May need to raise slightly as she might have gained some real wt.  5. Metabolic Bone Disease: Ca 7.5Corr Ca 9.1. Improved with 3.5 Ca Bath. Phos ok. No binders or VDRA 6.Nutrition: Alb exceptionally low - add pro-stat (reports Nepro has caused diarrhea in past) 7. H/O L mastectomy 8. Swelling/Warmth L forearm. Attempted to notify primary. Watch for recurrent cellulitis.  Breslin Hemann H. Keven Soucy NP-C 09/16/2019, 11:21 AM  [phone]

## 2019-09-16 NOTE — Progress Notes (Signed)
PROGRESS NOTE    Samantha Mckenzie  JJO:[id number] DOB: 1936/05/11 DOA: 09/11/2019 PCP: Colon Branch, MD     Brief Narrative:  83-year-WF PMHx depression, LEFT breast cancer s/p LEFT mastectomy + chemotherapy + XRT, anemia of chronic disease, Crohn's ileitis, HTN, CAD, ESRD on  HD T/TH/SAT, GERD  Admitted from home on 12/1 due to a syncopal episode.  She was getting ready for dialysis when she fell on the floor.  She has been having bloody stools for about a month.  She had a colonoscopy in August that showed a terminal ileum stricture and diverticulosis in the left colon, biopsies came back suspicious for Crohn's disease she was sent home on 10 weeks of tapering prednisone.  She is a recent start on hemodialysis about 1 month prior to admission.  She has been experiencing hematochezia for the past month.   Subjective: Last 24 hours afebrile states positive feeling of dizziness when she stands or sits up (presyncopal ).  States is fallen several times.  This fall positive LOC unsure of how long she was out.   Assessment & Plan:   Principal Problem:   Symptomatic anemia Active Problems:   HLD (hyperlipidemia)   Gout   Macrocytic anemia   Essential hypertension   GERD (gastroesophageal reflux disease)   Depression   Crohn's disease of small intestine with rectal bleeding (HCC)   Hypokalemia   ESRD (end stage renal disease) on dialysis ([place])   GI bleed   DNR (do not resuscitate)   Malnutrition of moderate degree   Rectal bleeding   Syncope   Syncopal event --Multifactorial symptomatic anemia, hypotension.  Symptomatic anemia Acute blood loss anemia due to Crohn's colitis on top of anemia of chronic kidney disease -She has been seen by GI this admission.  She refused full prep for colonoscopy and hence proceeded to flex sigmoidoscopy on 12/5. -No obvious source of bleeding on flex sig today. -GI is recommending either colonoscopy or tagged RBC scan if she continues to have  significant bleeding episodes. -Solu-Medrol 60 mg daily -12/2 transfuse 1 unit PRBC -12/3 transfuse 1 unit PRBC Recent Labs  Lab 09/12/19 0812 09/13/19 0146 09/14/19 0145 09/15/19 0958 09/16/19 0310  HGB 7.6* 7.1* 8.7* 9.2* 9.6*  -Appears to stabilized after 2 units of PRBC. -Hemoccult pending -See hypertension -transfuse for hemoglobin<7  ESRD on HD T/TH/SAT -HD per nephrology  Chronic diastolic congestive heart failure -2D echo in August 2018 with ejection fraction of 55% and grade 1 diastolic dysfunction. -Volume management per hemodialysis. -Strict in and out -Daily weight -Coreg 6.25 mg BID -Hydralazine 10 mg TID -Hydralazine PRN-  Essential hypertension -Given patient's new HD status and symptoms would not lower BP. -Daily orthostatic vitals. -See chronic diastolic CHF  Depression -Continue Lexapro, mood appears stable.  HLD -Lipitor 20 mg daily  GERD -Continue PPI   DVT prophylaxis: SCD Code Status: DNR Family Communication:  Disposition Plan: TBD   Consultants:  Nephrology GI    Procedures/Significant Events:  -12/2 transfuse 1 unit PRBC -12/3 transfuse 1 unit PRBC   I have personally reviewed and interpreted all radiology studies and my findings are as above.  VENTILATOR SETTINGS:    Cultures   Antimicrobials: Anti-infectives (From admission, onward)   None       Devices    LINES / TUBES:      Continuous Infusions:  sodium chloride     sodium chloride       Objective: Vitals:   09/15/19 1335 09/15/19 1405  09/15/19 2206 09/16/19 0513  BP: (!) 147/44 (!) 159/63 (!) 132/38 (!) 147/50  Pulse: 73 71 69 74  Resp: 18 16 17 16   Temp: 98 F (36.7 C) 98 F (36.7 C) 98.6 F (37 C) 98.9 F (37.2 C)  TempSrc: Oral Oral Oral Oral  SpO2: 100% 99% 98% 100%  Weight:      Height:        Intake/Output Summary (Last 24 hours) at 09/16/2019 0830 Last data filed at 09/15/2019 1245 Gross per 24 hour  Intake --   Output 1000 ml  Net -1000 ml   Filed Weights   09/13/19 1909 09/15/19 0500 09/15/19 1245  Weight: 53.2 kg 53 kg 52.5 kg    Examination:  General: A/O x4, no acute respiratory distress Eyes: negative scleral hemorrhage, negative anisocoria, negative icterus ENT: Negative Runny nose, negative gingival bleeding, Neck:  Negative scars, masses, torticollis, lymphadenopathy, JVD Lungs: Clear to auscultation bilaterally without wheezes or crackles Cardiovascular: Regular rate and rhythm without murmur gallop or rub normal S1 and S2 Abdomen: negative abdominal pain, nondistended, positive soft, bowel sounds, no rebound, no ascites, no appreciable mass Extremities: No significant cyanosis, clubbing, or edema bilateral lower extremities Skin: Negative rashes, lesions, ulcers Psychiatric:  Negative depression, negative anxiety, negative fatigue, negative mania  Central nervous system:  Cranial nerves II through XII intact, tongue/uvula midline, all extremities muscle strength 5/5, sensation intact throughout,negative dysarthria, negative expressive aphasia, negative receptive aphasia.  .     Data Reviewed: Care during the described time interval was provided by me .  I have reviewed this patient's available data, including medical history, events of note, physical examination, and all test results as part of my evaluation.  CBC: Recent Labs  Lab 09/12/19 0812 09/12/19 1830 09/13/19 0146 09/14/19 0145 09/15/19 0958 09/16/19 0310  WBC 7.4  --  6.6 6.7 7.2 7.9  NEUTROABS  --   --   --  4.3 6.3  --   HGB 7.6*  --  7.1* 8.7* 9.2* 9.6*  HCT 25.9* 24.2* 23.8* 27.2* 29.2* 30.1*  MCV 108.8*  --  109.7* 101.5* 100.7* 101.7*  PLT 171  --  127* 110* 128* 427*   Basic Metabolic Panel: Recent Labs  Lab 09/11/19 1222 09/11/19 1722 09/12/19 0812 09/14/19 0145 09/15/19 0958 09/16/19 0310  NA 133*  --  133* 137 129* 133*  K 3.2*  --  3.2* 3.3* 4.9 4.7  CL 94*  --  97* 101 97* 97*  CO2  19*  --  24 29 23 26   GLUCOSE 97  --  104* 134* 148* 216*  BUN 43*  --  45* 11 34* 18  CREATININE 6.43*  --  6.76* 2.20* 4.28* 2.59*  CALCIUM 8.8*  --  8.1* 6.9* 6.7* 7.5*  MG  --  1.9  --   --   --   --   PHOS  --  2.2* 2.9  --  3.2  --    GFR: Estimated Creatinine Clearance: 13.5 mL/min (A) (by C-G formula based on SCr of 2.59 mg/dL (H)). Liver Function Tests: Recent Labs  Lab 09/12/19 0812 09/15/19 0958  AST 13*  --   ALT 21  --   ALKPHOS 43  --   BILITOT 0.4  --   PROT 4.1*  --   ALBUMIN 1.9* 1.9*   No results for input(s): LIPASE, AMYLASE in the last 168 hours. No results for input(s): AMMONIA in the last 168 hours. Coagulation Profile: Recent Labs  Lab 09/11/19 1722  INR 1.0   Cardiac Enzymes: No results for input(s): CKTOTAL, CKMB, CKMBINDEX, TROPONINI in the last 168 hours. BNP (last 3 results) No results for input(s): PROBNP in the last 8760 hours. HbA1C: No results for input(s): HGBA1C in the last 72 hours. CBG: No results for input(s): GLUCAP in the last 168 hours. Lipid Profile: No results for input(s): CHOL, HDL, LDLCALC, TRIG, CHOLHDL, LDLDIRECT in the last 72 hours. Thyroid Function Tests: No results for input(s): TSH, T4TOTAL, FREET4, T3FREE, THYROIDAB in the last 72 hours. Anemia Panel: No results for input(s): VITAMINB12, FOLATE, FERRITIN, TIBC, IRON, RETICCTPCT in the last 72 hours. Sepsis Labs: Recent Labs  Lab 09/11/19 1722 09/11/19 2000  LATICACIDVEN 1.1 2.6*    Recent Results (from the past 240 hour(s))  SARS CORONAVIRUS 2 (TAT 6-24 HRS) Nasopharyngeal Nasopharyngeal Swab     Status: None   Collection Time: 09/11/19  4:06 PM   Specimen: Nasopharyngeal Swab  Result Value Ref Range Status   SARS Coronavirus 2 NEGATIVE NEGATIVE Final    Comment: (NOTE) SARS-CoV-2 target nucleic acids are NOT DETECTED. The SARS-CoV-2 RNA is generally detectable in upper and lower respiratory specimens during the acute phase of infection.  Negative results do not preclude SARS-CoV-2 infection, do not rule out co-infections with other pathogens, and should not be used as the sole basis for treatment or other patient management decisions. Negative results must be combined with clinical observations, patient history, and epidemiological information. The expected result is Negative. Fact Sheet for Patients: SugarRoll.be Fact Sheet for Healthcare Providers: https://www.-mathews.com/ This test is not yet approved or cleared by the [place] FDA and  has been authorized for detection and/or diagnosis of SARS-CoV-2 by FDA under an Emergency Use Authorization (EUA). This EUA will remain  in effect (meaning this test can be used) for the duration of the COVID-19 declaration under Section 56 4(b)(1) of the Act, 21 U.S.C. section 360bbb-3(b)(1), unless the authorization is terminated or revoked sooner. Performed at [facility], [place] [address], [place], [place] [address]   MRSA PCR Screening     Status: None   Collection Time: 09/12/19  6:50 AM   Specimen: Nasopharyngeal  Result Value Ref Range Status   MRSA by PCR NEGATIVE NEGATIVE Final    Comment:        The GeneXpert MRSA Assay (FDA approved for NASAL specimens only), is one component of a comprehensive MRSA colonization surveillance program. It is not intended to diagnose MRSA infection nor to guide or monitor treatment for MRSA infections. Performed at [facility], [place] [address], [place], [place] [address]          Radiology Studies: Ct Abdomen Pelvis W Contrast  Result Date: 09/14/2019 CLINICAL DATA:  Crohn's disease, rectal bleeding EXAM: CT ABDOMEN AND PELVIS WITH CONTRAST TECHNIQUE: Multidetector CT imaging of the abdomen and pelvis was performed using the standard protocol following bolus administration of intravenous contrast. CONTRAST:  54m OMNIPAQUE IOHEXOL 300 MG/ML  SOLN COMPARISON:   05/30/2019 FINDINGS: Lower chest: Small bilateral pleural effusions. Associated lower lobe atelectasis. Hepatobiliary: Liver is within normal limits. Gallbladder is unremarkable. No intrahepatic or extrahepatic ductal dilatation. Pancreas: Within normal limits. Spleen: Calcified splenic granulomata. Adrenals/Urinary Tract: Adrenal glands are within normal limits. Bilateral renal cysts, measuring up to 2.3 cm in the lateral right lower kidney. No hydronephrosis. Bladder is underdistended but unremarkable. Stomach/Bowel: Stomach is within normal limits. No evidence of bowel obstruction. Terminal ileum is within normal limits (series 7/image  51). However, a loop of mid/distal ileum in the right lower quadrant demonstrates wall thickening/inflammatory changes (series 7/image 86), suggesting infectious/inflammatory enteritis, likely related to Crohn's disease given the patient's clinical history. Sigmoid diverticulosis, without evidence of diverticulitis. No rectal wall thickening/inflammatory changes. Vascular/Lymphatic: No evidence of abdominal aortic aneurysm. Atherosclerotic calcifications of the abdominal aorta and branch vessels. No suspicious abdominopelvic lymphadenopathy. Reproductive: Uterus is within normal limits. Bilateral ovaries are unremarkable. Other: Trace right pelvic ascites. No drainable fluid collection/abscess.  No free air. Musculoskeletal: Grade 1 anterolisthesis of L5 on S1. IMPRESSION: Wall thickening/inflammatory changes involving a loop of mid/distal ileum in the right lower quadrant, suggesting infectious/inflammatory enteritis, likely related to active inflammatory Crohn's disease given the patient's clinical history. Trace right pelvic ascites. No drainable fluid collection/abscess. No free air. Small bilateral pleural effusions with associated lower lobe atelectasis. Electronically Signed   By: Julian Hy M.D.   On: 09/14/2019 13:50   Vas [place] Upper Extremity Venous  Duplex  Result Date: 09/15/2019 UPPER VENOUS STUDY  Indications: Swelling Limitations: Edema and poor ultrasound/tissue interface. Comparison Study: Prior LUEV study done 08/04/19 is on file for comparison Performing Technologist: Sharion Dove RVS  Examination Guidelines: A complete evaluation includes B-mode imaging, spectral Doppler, color Doppler, and power Doppler as needed of all accessible portions of each vessel. Bilateral testing is considered an integral part of a complete examination. Limited examinations for reoccurring indications may be performed as noted.  Right Findings: +----------+------------+---------+-----------+----------+-------+  RIGHT      Compressible Phasicity Spontaneous Properties Summary  +----------+------------+---------+-----------+----------+-------+  Subclavian                 Yes        Yes                         +----------+------------+---------+-----------+----------+-------+  Left Findings: +----------+------------+---------+-----------+----------+--------------+  LEFT       Compressible Phasicity Spontaneous Properties    Summary      +----------+------------+---------+-----------+----------+--------------+  IJV            Full        Yes        Yes                                +----------+------------+---------+-----------+----------+--------------+  Subclavian                 Yes        Yes                                +----------+------------+---------+-----------+----------+--------------+  Axillary                   Yes        Yes                                +----------+------------+---------+-----------+----------+--------------+  Brachial       Full        Yes        Yes                                +----------+------------+---------+-----------+----------+--------------+  Radial         Full                                                      +----------+------------+---------+-----------+----------+--------------+  Ulnar                                                     Not visualized  +----------+------------+---------+-----------+----------+--------------+  Cephalic       Full                                                      +----------+------------+---------+-----------+----------+--------------+  Basilic                                                  Not visualized  +----------+------------+---------+-----------+----------+--------------+ Interstitial fluid noted throughout  Summary:  Right: No evidence of thrombosis in the subclavian.  Left: No evidence of deep vein thrombosis in the upper extremity. However, unable to visualize the Ulnar and basilic veins. No evidence of superficial vein thrombosis in the upper extremity. This was a limited study. No change since prior study done 08/04/19.  *See table(s) above for measurements and observations.  Diagnosing physician: Deitra Mayo MD Electronically signed by Deitra Mayo MD on 09/15/2019 at 11:09:17 PM.    Final         Scheduled Meds:  sodium chloride   Intravenous Once   sodium chloride   Intravenous Once   allopurinol  100 mg Oral Daily   atorvastatin  20 mg Oral Daily   carvedilol  6.25 mg Oral BID WC   Chlorhexidine Gluconate Cloth  6 each Topical Q0600   dronabinol  2.5 mg Oral BID AC   escitalopram  20 mg Oral Daily   feeding supplement  1 Container Oral TID BM   feeding supplement (PRO-STAT SUGAR FREE 64)  30 mL Oral BID   hydrALAZINE  10 mg Oral Q8H   loperamide  2 mg Oral Daily   methylPREDNISolone (SOLU-MEDROL) injection  60 mg Intravenous Q24H   multivitamin  1 tablet Oral QHS   pantoprazole  40 mg Oral Q0600   Continuous Infusions:  sodium chloride     sodium chloride       LOS: 5 days    Time spent:40 min    Hendy Brindle, Geraldo Docker, MD [facility] Pager [phone]  If 7PM-7AM, please contact night-coverage [email or web address] Password [email or web address] 09/16/2019, 8:30 AM

## 2019-09-16 NOTE — H&P (View-Only) (Signed)
Progress Note   Subjective  No more bleeding since yesterday, Hgb has been stable, patient without pain in her abdomen / rectum. Have spoken with son and patient at length about options moving forward.   Objective   Vital signs in last 24 hours: Temp:  [97.8 F (36.6 C)-98.9 F (37.2 C)] 98.9 F (37.2 C) (12/06 0513) Pulse Rate:  [63-74] 74 (12/06 0513) Resp:  [12-18] 16 (12/06 0513) BP: (132-184)/(33-63) 147/50 (12/06 0513) SpO2:  [98 %-100 %] 100 % (12/06 0513) Weight:  [52.5 kg] 52.5 kg (12/05 1245) Last BM Date: 09/12/19 General:    white female in NAD Heart:  Regular rate and rhythm; no murmurs Lungs: Respirations even and unlabored,  Abdomen:  Soft, nontender and nondistended.  Extremities:  Without edema. Neurologic:  Alert and oriented,  grossly normal neurologically. Psych:  Cooperative. Normal mood and affect.  Intake/Output from previous day: 12/05 0701 - 12/06 0700 In: -  Out: 1000  Intake/Output this shift: Total I/O In: 120 [P.O.:120] Out: -   Lab Results: Recent Labs    09/14/19 0145 09/15/19 0958 09/16/19 0310  WBC 6.7 7.2 7.9  HGB 8.7* 9.2* 9.6*  HCT 27.2* 29.2* 30.1*  PLT 110* 128* 131*   BMET Recent Labs    09/14/19 0145 09/15/19 0958 09/16/19 0310  NA 137 129* 133*  K 3.3* 4.9 4.7  CL 101 97* 97*  CO2 29 23 26   GLUCOSE 134* 148* 216*  BUN 11 34* 18  CREATININE 2.20* 4.28* 2.59*  CALCIUM 6.9* 6.7* 7.5*   LFT Recent Labs    09/15/19 0958  ALBUMIN 1.9*   PT/INR No results for input(s): LABPROT, INR in the last 72 hours.  Studies/Results: Ct Abdomen Pelvis W Contrast  Result Date: 09/14/2019 CLINICAL DATA:  Crohn's disease, rectal bleeding EXAM: CT ABDOMEN AND PELVIS WITH CONTRAST TECHNIQUE: Multidetector CT imaging of the abdomen and pelvis was performed using the standard protocol following bolus administration of intravenous contrast. CONTRAST:  48m OMNIPAQUE IOHEXOL 300 MG/ML  SOLN COMPARISON:  05/30/2019 FINDINGS:  Lower chest: Small bilateral pleural effusions. Associated lower lobe atelectasis. Hepatobiliary: Liver is within normal limits. Gallbladder is unremarkable. No intrahepatic or extrahepatic ductal dilatation. Pancreas: Within normal limits. Spleen: Calcified splenic granulomata. Adrenals/Urinary Tract: Adrenal glands are within normal limits. Bilateral renal cysts, measuring up to 2.3 cm in the lateral right lower kidney. No hydronephrosis. Bladder is underdistended but unremarkable. Stomach/Bowel: Stomach is within normal limits. No evidence of bowel obstruction. Terminal ileum is within normal limits (series 7/image 47). However, a loop of mid/distal ileum in the right lower quadrant demonstrates wall thickening/inflammatory changes (series 7/image 86), suggesting infectious/inflammatory enteritis, likely related to Crohn's disease given the patient's clinical history. Sigmoid diverticulosis, without evidence of diverticulitis. No rectal wall thickening/inflammatory changes. Vascular/Lymphatic: No evidence of abdominal aortic aneurysm. Atherosclerotic calcifications of the abdominal aorta and branch vessels. No suspicious abdominopelvic lymphadenopathy. Reproductive: Uterus is within normal limits. Bilateral ovaries are unremarkable. Other: Trace right pelvic ascites. No drainable fluid collection/abscess.  No free air. Musculoskeletal: Grade 1 anterolisthesis of L5 on S1. IMPRESSION: Wall thickening/inflammatory changes involving a loop of mid/distal ileum in the right lower quadrant, suggesting infectious/inflammatory enteritis, likely related to active inflammatory Crohn's disease given the patient's clinical history. Trace right pelvic ascites. No drainable fluid collection/abscess. No free air. Small bilateral pleural effusions with associated lower lobe atelectasis. Electronically Signed   By: SJulian HyM.D.   On: 09/14/2019 13:50   Vas [place] Extremity  Venous Duplex  Result Date:  09/15/2019 UPPER VENOUS STUDY  Indications: Swelling Limitations: Edema and poor ultrasound/tissue interface. Comparison Study: Prior LUEV study done 08/04/19 is on file for comparison Performing Technologist: Sharion Dove RVS  Examination Guidelines: A complete evaluation includes B-mode imaging, spectral Doppler, color Doppler, and power Doppler as needed of all accessible portions of each vessel. Bilateral testing is considered an integral part of a complete examination. Limited examinations for reoccurring indications may be performed as noted.  Right Findings: +----------+------------+---------+-----------+----------+-------+  RIGHT      Compressible Phasicity Spontaneous Properties Summary  +----------+------------+---------+-----------+----------+-------+  Subclavian                 Yes        Yes                         +----------+------------+---------+-----------+----------+-------+  Left Findings: +----------+------------+---------+-----------+----------+--------------+  LEFT       Compressible Phasicity Spontaneous Properties    Summary      +----------+------------+---------+-----------+----------+--------------+  IJV            Full        Yes        Yes                                +----------+------------+---------+-----------+----------+--------------+  Subclavian                 Yes        Yes                                +----------+------------+---------+-----------+----------+--------------+  Axillary                   Yes        Yes                                +----------+------------+---------+-----------+----------+--------------+  Brachial       Full        Yes        Yes                                +----------+------------+---------+-----------+----------+--------------+  Radial         Full                                                      +----------+------------+---------+-----------+----------+--------------+  Ulnar                                                    Not  visualized  +----------+------------+---------+-----------+----------+--------------+  Cephalic       Full                                                      +----------+------------+---------+-----------+----------+--------------+  Basilic                                                  Not visualized  +----------+------------+---------+-----------+----------+--------------+ Interstitial fluid noted throughout  Summary:  Right: No evidence of thrombosis in the subclavian.  Left: No evidence of deep vein thrombosis in the upper extremity. However, unable to visualize the Ulnar and basilic veins. No evidence of superficial vein thrombosis in the upper extremity. This was a limited study. No change since prior study done 08/04/19.  *See table(s) above for measurements and observations.  Diagnosing physician: Deitra Mayo MD Electronically signed by Deitra Mayo MD on 09/15/2019 at 11:09:17 PM.    Final        Assessment / Plan:    83 y/o female with ESRD on HD, presumptive Crohn's disease diagnosed earlier this summer, who presenting with worsening anemia and ongoing rectal bleeding.   Rectal bleeding not new however worsening anemia needing transfusions. She had been on steroids with improvement of her pain but not bleeding, prednisone had been tapered to 75m prior to admission. Repeated CT scan does not show IC valve stricture (previously noted on last colonoscopy) and ileum looks improved compared to previous, but still some active areas of suspected active Crohn's, she is clearly not in remission.   I had recommended colonoscopy previously, patient felt she could not do prep previously, we did a flex sig yesterday, prep was poor, nothing obviously noted other than a very small appearing ? fissure which I'm not convinced is causing all of this, she has no pain from the site either, although will treat with topical nitroglycerin in case related in any way.  Have had a good conversation  with the patient's son and patient this AM. Colonoscopy / EGD is the best way to evaluate her bowel for this issue, try to look into the ileum if we can now that inflammation is better controlled and confirm where this is coming from given her worsening anemia / transfusion needs, as she only has bleeding and her other Crohn's symptoms have resolved, a bit atypical to have solely bleeding from ileal Crohn's disease without other symptoms. After discussion of procedures / options / risks / benefits, son strongly wants to proceed and patient wants to proceed with these exams tomorrow if possible. Clear liquid diet today, prep tonight, hopefully to have procedures done tomorrow with Dr. MRush Landmark   Continue IV solumedrol today as done, may taper tomorrow pending endoscopic appearance back to oral prednisone.   SCarolina Cellar MD [facility]

## 2019-09-17 ENCOUNTER — Encounter (HOSPITAL_COMMUNITY)
Admission: EM | Disposition: A | Discharge status: Home Health | Payer: Self-pay | Source: Home / Self Care | Attending: Family Medicine

## 2019-09-17 ENCOUNTER — Inpatient Hospital Stay (HOSPITAL_COMMUNITY): Payer: Medicare Other | Admitting: Certified Registered Nurse Anesthetist

## 2019-09-17 ENCOUNTER — Encounter (HOSPITAL_COMMUNITY): Payer: Self-pay | Admitting: *Deleted

## 2019-09-17 ENCOUNTER — Other Ambulatory Visit: Payer: Self-pay | Admitting: Internal Medicine

## 2019-09-17 DIAGNOSIS — Z66 Do not resuscitate: Secondary | ICD-10-CM

## 2019-09-17 HISTORY — PX: BIOPSY: SHX5522

## 2019-09-17 HISTORY — PX: POLYPECTOMY: SHX5525

## 2019-09-17 HISTORY — PX: ESOPHAGOGASTRODUODENOSCOPY (EGD) WITH PROPOFOL: SHX5813

## 2019-09-17 HISTORY — PX: COLONOSCOPY WITH PROPOFOL: SHX5780

## 2019-09-17 LAB — POCT I-STAT, CHEM 8
BUN: 34 mg/dL — ABNORMAL HIGH (ref 8–23)
Calcium, Ion: 1.06 mmol/L — ABNORMAL LOW (ref 1.15–1.40)
Chloride: 96 mmol/L — ABNORMAL LOW (ref 98–111)
Creatinine, Ser: 3.7 mg/dL — ABNORMAL HIGH (ref 0.44–1.00)
Glucose, Bld: 135 mg/dL — ABNORMAL HIGH (ref 70–99)
HCT: 29 % — ABNORMAL LOW (ref 36.0–46.0)
Hemoglobin: 9.9 g/dL — ABNORMAL LOW (ref 12.0–15.0)
Potassium: 4.4 mmol/L (ref 3.5–5.1)
Sodium: 134 mmol/L — ABNORMAL LOW (ref 135–145)
TCO2: 26 mmol/L (ref 22–32)

## 2019-09-17 SURGERY — COLONOSCOPY WITH PROPOFOL
Anesthesia: Monitor Anesthesia Care

## 2019-09-17 MED ORDER — CHLORHEXIDINE GLUCONATE CLOTH 2 % EX PADS
6.0000 | MEDICATED_PAD | Freq: Every day | CUTANEOUS | Status: DC
  Administered 2019-09-18: 6 via TOPICAL

## 2019-09-17 MED ORDER — SODIUM CHLORIDE 0.9 % IV SOLN
510.0000 mg | Freq: Once | INTRAVENOUS | Status: AC
  Administered 2019-09-17: 15:00:00 510 mg via INTRAVENOUS
  Filled 2019-09-17: qty 17

## 2019-09-17 MED ORDER — PROPOFOL 10 MG/ML IV BOLUS
INTRAVENOUS | Status: DC | PRN
  Administered 2019-09-17: 20 mg via INTRAVENOUS

## 2019-09-17 MED ORDER — SODIUM CHLORIDE 0.9 % IV SOLN
INTRAVENOUS | Status: DC
  Administered 2019-09-17: 09:00:00 via INTRAVENOUS

## 2019-09-17 MED ORDER — HYDRALAZINE HCL 20 MG/ML IJ SOLN
INTRAMUSCULAR | Status: AC
  Filled 2019-09-17: qty 1

## 2019-09-17 MED ORDER — OLOPATADINE HCL 0.1 % OP SOLN
1.0000 [drp] | Freq: Two times a day (BID) | OPHTHALMIC | Status: DC
  Administered 2019-09-17 – 2019-09-18 (×3): 1 [drp] via OPHTHALMIC
  Filled 2019-09-17: qty 5

## 2019-09-17 MED ORDER — PROPOFOL 500 MG/50ML IV EMUL
INTRAVENOUS | Status: DC | PRN
  Administered 2019-09-17: 125 ug/kg/min via INTRAVENOUS

## 2019-09-17 MED ORDER — METHYLPREDNISOLONE SODIUM SUCC 40 MG IJ SOLR
20.0000 mg | Freq: Two times a day (BID) | INTRAMUSCULAR | Status: DC
  Administered 2019-09-17 – 2019-09-18 (×2): 20 mg via INTRAVENOUS
  Filled 2019-09-17 (×2): qty 1

## 2019-09-17 MED ORDER — HYDROCORTISONE ACETATE 25 MG RE SUPP
25.0000 mg | Freq: Two times a day (BID) | RECTAL | Status: DC
  Administered 2019-09-17 – 2019-09-18 (×3): 25 mg via RECTAL
  Filled 2019-09-17 (×3): qty 1

## 2019-09-17 SURGICAL SUPPLY — 25 items

## 2019-09-17 NOTE — Progress Notes (Signed)
OT Cancellation Note  Patient Details Name: Samantha Mckenzie MRN: [id number] DOB: [birth year]   Cancelled Treatment:    First attempt patient out of room for colonoscopy. Second attempt patient in bed eating her lunch reports feeling fatigued and "getting something else done later." Politely declines OT today. Re-attempt 12/8 as appropriate.   [place] OT office: [place] 09/17/2019, 1:47 PM

## 2019-09-17 NOTE — Plan of Care (Signed)
  Problem: Health Behavior/Discharge Planning: Goal: Ability to manage health-related needs will improve Outcome: Progressing   

## 2019-09-17 NOTE — Progress Notes (Signed)
IV team at bedside 

## 2019-09-17 NOTE — Progress Notes (Signed)
PT Cancellation Note  Patient Details Name: NOVELLA ABRAHA MRN: [id number] DOB: 07/21/1936   Cancelled Treatment:    Reason Eval/Treat Not Completed: Patient at procedure or test/unavailable   Will follow up later today as time allows;  Otherwise, will follow up for PT tomorrow;   Thank you,  Roney Marion, PT  Acute Rehabilitation Services Pager [phone] Office [place] 09/17/2019, 8:54 AM

## 2019-09-17 NOTE — Op Note (Signed)
[facility] Patient Name: Samantha Mckenzie Procedure Date : 09/17/2019 MRN: [id number] Attending MD: Justice Britain , MD Date of Birth: [birth year] CSN: [id number] Age: 83 Admit Type: Inpatient Procedure:                Colonoscopy Indications:              Hematochezia, Iron deficiency anemia secondary to                            chronic blood loss, Crohn's disease, Follow-up of                            Crohn's disease Providers:                Justice Britain, MD, Carlyn Reichert, RN, Lazaro Arms, Technician, Janeece Agee, Technician Referring MD:             Dr. Havery Moros, Dr. Henrene Pastor, Inpatient Hospitalists Medicines:                Monitored Anesthesia Care Complications:            No immediate complications. Estimated Blood Loss:     Estimated blood loss was minimal. Procedure:                Pre-Anesthesia Assessment:                           - Prior to the procedure, a History and Physical                            was performed, and patient medications and                            allergies were reviewed. The patient's tolerance of                            previous anesthesia was also reviewed. The risks                            and benefits of the procedure and the sedation                            options and risks were discussed with the patient.                            All questions were answered, and informed consent                            was obtained. Prior Anticoagulants: The patient has                            taken no previous anticoagulant or antiplatelet  agents. ASA Grade Assessment: III - A patient with                            severe systemic disease. After reviewing the risks                            and benefits, the patient was deemed in                            satisfactory condition to undergo the procedure.                           After obtaining informed  consent, the colonoscope                            was passed under direct vision. Throughout the                            procedure, the patient's blood pressure, pulse, and                            oxygen saturations were monitored continuously. The                            PCF-H190DL ([id number]) Olympus pediatric colonscope                            was introduced through the anus and advanced to the                            4 cm into the ileum. The colonoscopy was performed                            without difficulty. The patient tolerated the                            procedure. The quality of the bowel preparation was                            adequate. The terminal ileum, ileocecal valve,                            appendiceal orifice, and rectum were photographed. Scope In: 9:52:34 AM Scope Out: 10:30:43 AM Scope Withdrawal Time: 0 hours 33 minutes 33 seconds  Total Procedure Duration: 0 hours 38 minutes 9 seconds  Findings:      The digital rectal exam findings include anal fissure and hemorrhoids.       Pertinent negatives include no palpable rectal lesions.      A moderate amount of semi-liquid stool was found in the entire colon,       interfering with visualization. Lavage of the area was performed using       copious amounts, resulting in clearance with adequate visualization.      The ileocecal valve and ileum, 4 cm from the  ileocecal valve appeared       normal. Biopsies were taken with a cold forceps for histology.      Three sessile polyps were found in the descending colon (1), transverse       colon (1), and cecum (1). The polyps were 2 to 4 mm in size. These       polyps were removed with a cold snare. Resection and retrieval were       complete.      Many small and large-mouthed diverticula were found in the recto-sigmoid       colon, sigmoid colon and descending colon.      Normal mucosa was found in the entire colon otherwise. Biopsies were        taken with a cold forceps for histology from the right colon. Biopsies       were taken with a cold forceps for histology from the left colon.       Biopsies were taken with a cold forceps for histology from the rectum.      Non-bleeding non-thrombosed external and internal hemorrhoids were found       during retroflexion, during perianal exam and during digital exam. The       hemorrhoids were Grade II (internal hemorrhoids that prolapse but reduce       spontaneously). Impression:               - Anal fissure and hemorrhoids found on digital                            rectal exam.                           - The examined portion of the ileum was normal.                            Biopsied.                           - Diverticulosis in the recto-sigmoid colon, in the                            sigmoid colon and in the descending colon.                           - Normal mucosa in the entire examined colon.                            Biopsied.                           - Non-bleeding non-thrombosed external and internal                            hemorrhoids. Recommendation:           - The patient will be observed post-procedure,                            until all discharge criteria are met.                           -  Return patient to hospital ward for ongoing care.                           - Await pathology results.                           - Repeat colonoscopy.                           - Anusol BID (once before bedtime).                           - Sitz bathes QHS.                           - Low residue diet and Renal diet                           - Consider IV Iron infusion.                           - If significant bleeding and transfusion dependent                            anemia develops then would recommend Tagged RBC                            scan over VCE due to the inflammation noted on                            recent CT scan to see if this is source of  bleeding.                           - Continue IV Solumedrol transitioning to 20 mg                            Q12H.                           - The findings and recommendations were discussed                            with the patient.                           - The findings and recommendations were discussed                            with the patient's family. Procedure Code(s):        --- Professional ---                           [phone], Colonoscopy, flexible; with removal of                            tumor(s), polyp(s), or other lesion(s)  by snare                            technique                           45380, 59, Colonoscopy, flexible; with biopsy,                            single or multiple Diagnosis Code(s):        --- Professional ---                           K60.2, Anal fissure, unspecified                           K64.1, Second degree hemorrhoids                           K92.1, Melena (includes Hematochezia)                           D50.0, Iron deficiency anemia secondary to blood                            loss (chronic)                           K50.90, Crohn's disease, unspecified, without                            complications                           K57.30, Diverticulosis of large intestine without                            perforation or abscess without bleeding CPT copyright 2019 American Medical Association. All rights reserved. The codes documented in this report are preliminary and upon coder review may  be revised to meet current compliance requirements. Justice Britain, MD 09/17/2019 11:06:11 AM Number of Addenda: 0

## 2019-09-17 NOTE — Interval H&P Note (Signed)
History and Physical Interval Note:  09/17/2019 9:09 AM  Samantha Mckenzie  has presented today for surgery, with the diagnosis of anemia, GI bleeding, history of Crohn's disease.  The various methods of treatment have been discussed with the patient and family. After consideration of risks, benefits and other options for treatment, the patient has consented to  Procedure(s): COLONOSCOPY WITH PROPOFOL (N/A) ESOPHAGOGASTRODUODENOSCOPY (EGD) WITH PROPOFOL (N/A) as a surgical intervention.  The patient's history has been reviewed, patient examined, no change in status, stable for surgery.  I have reviewed the patient's chart and labs.  Questions were answered to the patient's satisfaction.     [employer]

## 2019-09-17 NOTE — Op Note (Signed)
[facility] Patient Name: Samantha Mckenzie Procedure Date : 09/17/2019 MRN: [id number] Attending MD: Justice Britain , MD Date of Birth: 12/19/1935 CSN: [id number] Age: 83 Admit Type: Inpatient Procedure:                Upper GI endoscopy Indications:              Iron deficiency anemia secondary to chronic blood                            loss, Hematochezia, Crohn's disease Providers:                Justice Britain, MD, Carlyn Reichert, RN, Lazaro Arms, Technician, Janeece Agee, Technician Referring MD:             Docia Chuck. Henrene Pastor, MD, Carlota Raspberry. Havery Moros, MD, [facility]                            Hospitalists Medicines:                Monitored Anesthesia Care Complications:            No immediate complications. Estimated Blood Loss:     Estimated blood loss was minimal. Procedure:                Pre-Anesthesia Assessment:                           - Prior to the procedure, a History and Physical                            was performed, and patient medications and                            allergies were reviewed. The patient's tolerance of                            previous anesthesia was also reviewed. The risks                            and benefits of the procedure and the sedation                            options and risks were discussed with the patient.                            All questions were answered, and informed consent                            was obtained. Prior Anticoagulants: The patient has                            taken no previous anticoagulant or antiplatelet  agents. ASA Grade Assessment: III - A patient with                            severe systemic disease. After reviewing the risks                            and benefits, the patient was deemed in                            satisfactory condition to undergo the procedure.                           After obtaining informed consent, the  endoscope was                            passed under direct vision. Throughout the                            procedure, the patient's blood pressure, pulse, and                            oxygen saturations were monitored continuously. The                            GIF-H190 ([id number]) Olympus gastroscope was                            introduced through the mouth, and advanced to the                            second part of duodenum. The upper GI endoscopy was                            accomplished without difficulty. The patient                            tolerated the procedure. Scope In: Scope Out: Findings:      No gross lesions were noted in the entire esophagus.      The Z-line was regular and was found 36 cm from the incisors.      A small hiatal hernia was present.      No gross lesions were noted in the entire examined stomach. Biopsies       were taken with a cold forceps for histology and Helicobacter pylori       testing.      No gross lesions were noted in the duodenal bulb, in the first portion       of the duodenum and in the second portion of the duodenum. Biopsies were       taken with a cold forceps for histology. Impression:               - No gross lesions in esophagus.                           - Z-line regular, 36 cm from the  incisors.                           - Small hiatal hernia.                           - No gross lesions in the stomach. Biopsied.                           - No gross lesions in the duodenal bulb, in the                            first portion of the duodenum and in the second                            portion of the duodenum. Biopsied. Recommendation:           - Proceed to scheduled colonoscopy.                           - Observe patient's clinical course.                           - Await pathology results.                           - The findings and recommendations were discussed                            with the patient.                            - The findings and recommendations were discussed                            with the patient's family. Procedure Code(s):        --- Professional ---                           [phone], Esophagogastroduodenoscopy, flexible,                            transoral; with biopsy, single or multiple Diagnosis Code(s):        --- Professional ---                           K44.9, Diaphragmatic hernia without obstruction or                            gangrene                           D50.0, Iron deficiency anemia secondary to blood                            loss (chronic)  K92.1, Melena (includes Hematochezia)                           K50.90, Crohn's disease, unspecified, without                            complications CPT copyright 2019 American Medical Association. All rights reserved. The codes documented in this report are preliminary and upon coder review may  be revised to meet current compliance requirements. Justice Britain, MD 09/17/2019 10:41:04 AM Number of Addenda: 0

## 2019-09-17 NOTE — Progress Notes (Signed)
[facility] Progress Note   Subjective:  Seen in room - s/p EGD/colonoscopy this morning - no acute source of bleeding - diverticulosis + internal hemorrhoids. No CP/dyspnea.  Objective Vitals:   09/17/19 1130 09/17/19 1140 09/17/19 1150 09/17/19 1200  BP: (!) 177/63 (!) 175/59 (!) 175/61 (!) 185/59  Pulse: 66 65 69 63  Resp: 11 12 11 11   Temp:      TempSrc:      SpO2: 98% 99% 97% 97%  Weight:      Height:       Physical Exam General: Frail appearing woman, NAD. On room air. Heart: RRR; no murmur Lungs: CTA anteriorly Abdomen:soft, non-tender Extremities: No LE edema. L arm 2+ pitting edema (Hx mastectomy), R forearm 1+ edema. Dialysis Access: [facility] in R chest, R AVG + bruit  Additional Objective Labs: Basic Metabolic Panel: Recent Labs  Lab 09/11/19 1722 09/12/19 0812 09/14/19 0145 09/15/19 0958 09/16/19 0310 09/17/19 0930  NA  --  133* 137 129* 133* 134*  K  --  3.2* 3.3* 4.9 4.7 4.4  CL  --  97* 101 97* 97* 96*  CO2  --  24 29 23 26   --   GLUCOSE  --  104* 134* 148* 216* 135*  BUN  --  45* 11 34* 18 34*  CREATININE  --  6.76* 2.20* 4.28* 2.59* 3.70*  CALCIUM  --  8.1* 6.9* 6.7* 7.5*  --   PHOS 2.2* 2.9  --  3.2  --   --    Liver Function Tests: Recent Labs  Lab 09/12/19 0812 09/15/19 0958  AST 13*  --   ALT 21  --   ALKPHOS 43  --   BILITOT 0.4  --   PROT 4.1*  --   ALBUMIN 1.9* 1.9*   CBC: Recent Labs  Lab 09/12/19 0812  09/13/19 0146 09/14/19 0145 09/15/19 0958 09/16/19 0310 09/17/19 0930  WBC 7.4  --  6.6 6.7 7.2 7.9  --   NEUTROABS  --   --   --  4.3 6.3  --   --   HGB 7.6*  --  7.1* 8.7* 9.2* 9.6* 9.9*  HCT 25.9*   < > 23.8* 27.2* 29.2* 30.1* 29.0*  MCV 108.8*  --  109.7* 101.5* 100.7* 101.7*  --   PLT 171  --  127* 110* 128* 131*  --    < > = values in this interval not displayed.   Studies/Results: Vas [place] Upper Extremity Venous Duplex  Result Date: 09/15/2019 UPPER VENOUS STUDY  Indications: Swelling Limitations:  Edema and poor ultrasound/tissue interface. Comparison Study: Prior LUEV study done 08/04/19 is on file for comparison Performing Technologist: Sharion Dove RVS  Examination Guidelines: A complete evaluation includes B-mode imaging, spectral Doppler, color Doppler, and power Doppler as needed of all accessible portions of each vessel. Bilateral testing is considered an integral part of a complete examination. Limited examinations for reoccurring indications may be performed as noted.  Right Findings: +----------+------------+---------+-----------+----------+-------+ RIGHT     CompressiblePhasicitySpontaneousPropertiesSummary +----------+------------+---------+-----------+----------+-------+ Subclavian               Yes       Yes                      +----------+------------+---------+-----------+----------+-------+  Left Findings: +----------+------------+---------+-----------+----------+--------------+ LEFT      CompressiblePhasicitySpontaneousProperties   Summary     +----------+------------+---------+-----------+----------+--------------+ IJV           Full  Yes       Yes                             +----------+------------+---------+-----------+----------+--------------+ Subclavian               Yes       Yes                             +----------+------------+---------+-----------+----------+--------------+ Axillary                 Yes       Yes                             +----------+------------+---------+-----------+----------+--------------+ Brachial      Full       Yes       Yes                             +----------+------------+---------+-----------+----------+--------------+ Radial        Full                                                 +----------+------------+---------+-----------+----------+--------------+ Ulnar                                               Not visualized  +----------+------------+---------+-----------+----------+--------------+ Cephalic      Full                                                 +----------+------------+---------+-----------+----------+--------------+ Basilic                                             Not visualized +----------+------------+---------+-----------+----------+--------------+ Interstitial fluid noted throughout  Summary:  Right: No evidence of thrombosis in the subclavian.  Left: No evidence of deep vein thrombosis in the upper extremity. However, unable to visualize the Ulnar and basilic veins. No evidence of superficial vein thrombosis in the upper extremity. This was a limited study. No change since prior study done 08/04/19.  *See table(s) above for measurements and observations.  Diagnosing physician: Deitra Mayo MD Electronically signed by Deitra Mayo MD on 09/15/2019 at 11:09:17 PM.    Final    Medications: . sodium chloride    . sodium chloride    . ferumoxytol     . sodium chloride   Intravenous Once  . sodium chloride   Intravenous Once  . allopurinol  100 mg Oral Daily  . atorvastatin  20 mg Oral Daily  . carvedilol  6.25 mg Oral BID WC  . Chlorhexidine Gluconate Cloth  6 each Topical Q0600  . dronabinol  2.5 mg Oral BID AC  . escitalopram  20 mg Oral Daily  . feeding supplement  1 Container Oral TID BM  . feeding supplement (PRO-STAT SUGAR FREE 64)  30 mL Oral BID  . hydrALAZINE  10 mg Oral Q8H  . hydrocortisone  25 mg Rectal BID  . loperamide  2 mg Oral Daily  . methylPREDNISolone (SOLU-MEDROL) injection  20 mg Intravenous Q12H  . multivitamin  1 tablet Oral QHS  . nitroGLYCERIN  0.5 inch Topical Q12H  . pantoprazole  40 mg Oral Q0600    Dialysis Orders: [facility]  4 hrs EDW 51 kg 2K/2.25 Ca bath F180 BFR 400/ DFR 800 Access R IJ TDC, no heparin R AVG (ready for cannulation this week) - Venofer 50 q week - Mircera 225 mcg q 2 weeks, last given  11/25  Assessment/Plan: 1.  Syncope and fall: In setting of GIB, CT head negative. Dizziness may be more vertigo/BPPV. Follow. 2. GI bleed and Crohn's: GI following. S/p 2U PRBC this admit. S/p EGD/colonoscopy 12/7. Hgb stabilizing. For 1 dose Feraheme. 3.  ESRD: Continue HD per TTS sched - HD tomorrow, no heparin. 4.  Hypertension: BP high, ^ UF goal with next HD. 5. Metabolic Bone Disease:CorrCa/Phos ok. No binders or VDRA 6. Nutrition: Alb exceptionally low - continue  pro-stat (reports Nepro has caused diarrhea in past) 7.  Hx L mastectomy 8.  DNR   Veneta Penton, PA-C 09/17/2019, 2:24 PM  [facility] Pager: [phone]

## 2019-09-17 NOTE — Progress Notes (Signed)
Received patient from Endo. Patient alert and oriented x4. No complaints of pain at this time. She is concerned about the edema in her left arm, elevated arm. Dr. Bonner Puna at the bedside. Will continue to monitor.

## 2019-09-17 NOTE — Transfer of Care (Signed)
Immediate Anesthesia Transfer of Care Note  Patient: Samantha Mckenzie  Procedure(s) Performed: COLONOSCOPY WITH PROPOFOL (N/A ) ESOPHAGOGASTRODUODENOSCOPY (EGD) WITH PROPOFOL (N/A ) BIOPSY POLYPECTOMY  Patient Location: Endoscopy Unit  Anesthesia Type:MAC  Level of Consciousness: drowsy  Airway & Oxygen Therapy: Patient Spontanous Breathing and Patient connected to nasal cannula oxygen  Post-op Assessment: Report given to RN and Post -op Vital signs reviewed and stable  Post vital signs: Reviewed and stable  Last Vitals:  Vitals Value Taken Time  BP    Temp    Pulse    Resp    SpO2      Last Pain:  Vitals:   09/17/19 0829  TempSrc: Temporal  PainSc: 0-No pain      Patients Stated Pain Goal: 0 (75/44/92 0100)  Complications: No apparent anesthesia complications

## 2019-09-17 NOTE — Anesthesia Postprocedure Evaluation (Signed)
Anesthesia Post Note  Patient: Samantha Mckenzie  Procedure(s) Performed: COLONOSCOPY WITH PROPOFOL (N/A ) ESOPHAGOGASTRODUODENOSCOPY (EGD) WITH PROPOFOL (N/A ) BIOPSY POLYPECTOMY     Patient location during evaluation: Endoscopy Anesthesia Type: MAC Level of consciousness: awake and alert Pain management: pain level controlled Vital Signs Assessment: post-procedure vital signs reviewed and stable Respiratory status: spontaneous breathing, nonlabored ventilation, respiratory function stable and patient connected to nasal cannula oxygen Cardiovascular status: stable and blood pressure returned to baseline Postop Assessment: no apparent nausea or vomiting Anesthetic complications: no    Last Vitals:  Vitals:   09/17/19 1444 09/17/19 1547  BP: (!) 109/50   Pulse: 73   Resp: 12   Temp:  (!) 36.4 C  SpO2: 100%     Last Pain:  Vitals:   09/17/19 1547  TempSrc: Oral  PainSc:                  [name] COKER

## 2019-09-17 NOTE — Anesthesia Preprocedure Evaluation (Addendum)
Anesthesia Evaluation  Patient identified by MRN, date of birth, ID band Patient awake    Reviewed: Allergy & Precautions, NPO status , Patient's Chart, lab work & pertinent test results  Airway Mallampati: II  TM Distance: >3 FB Neck ROM: Full    Dental  (+) Teeth Intact   Pulmonary    breath sounds clear to auscultation       Cardiovascular hypertension,  Rhythm:Regular Rate:Normal     Neuro/Psych    GI/Hepatic   Endo/Other    Renal/GU      Musculoskeletal   Abdominal   Peds  Hematology   Anesthesia Other Findings   Reproductive/Obstetrics                            Anesthesia Physical Anesthesia Plan  ASA: III  Anesthesia Plan: MAC   Post-op Pain Management:    Induction: Intravenous  PONV Risk Score and Plan: Propofol infusion and Ondansetron  Airway Management Planned: Natural Airway and Simple Face Mask  Additional Equipment:   Intra-op Plan:   Post-operative Plan:   Informed Consent: I have reviewed the patients History and Physical, chart, labs and discussed the procedure including the risks, benefits and alternatives for the proposed anesthesia with the patient or authorized representative who has indicated his/her understanding and acceptance.       Plan Discussed with: CRNA and Anesthesiologist  Anesthesia Plan Comments: (Last HD 09/15/19 )       Anesthesia Quick Evaluation

## 2019-09-17 NOTE — Progress Notes (Addendum)
PROGRESS NOTE  Samantha Mckenzie  CEY:[id number] DOB: 04/25/1936 DOA: 09/11/2019 PCP: Colon Branch, MD   Brief Narrative: Samantha Mckenzie is an 83 y.o. female admitted from home on 12/1 due to a syncopal episode.  She was getting ready for dialysis when she fell on the floor.  She has been having bloody stools for about a month.  She had a colonoscopy in August that showed a terminal ileum stricture and diverticulosis in the left colon, biopsies came back suspicious for Crohn's disease she was sent home on 10 weeks of tapering prednisone.  She is a recent start on hemodialysis about 1 month prior to admission.    Assessment & Plan: Principal Problem:   Symptomatic anemia Active Problems:   HLD (hyperlipidemia)   Gout   Macrocytic anemia   Essential hypertension   GERD (gastroesophageal reflux disease)   Depression   Crohn's disease of small intestine with rectal bleeding (HCC)   Hypokalemia   ESRD (end stage renal disease) on dialysis ([place])   GI bleed   DNR (do not resuscitate)   Malnutrition of moderate degree   Rectal bleeding   Syncope  Crohn's ileitis, diverticulosis:  - Continue IV solumedrol per GI.  Internal and external hemorrhoids:  - Anusol, sitz baths  Symptomatic acute blood loss anemia on anemia of chronic renal disease:  - s/p 2u PRBCs earlier this admission. Hgb stable. - IV iron ordered - Continue PPI, advance diet per GI  Syncope, fall at home: Possibly related to decreased EABV with GI bleed. Negative CT head.  - Consider cardiac monitoring as outpatient if recurs.  ESRD:  - Continue TTS HD per nephrology.   HTN: Persistently elevated BPs.  - Continue coreg, UF goal increase per nephrology  Chronic HFpEF: Echo Aug 2018 w/ LVEF 55%, G1DD. - Volume management per nephrology - Continue antihypertensive   Moderate protein calorie malnutrition:  - Supplement with prostat.   Left arm lymphedema with hx left breast CA s/p mastectomy, chemo and XRT:  - Pt  declines sleeve. Will elevate extremity, replace restricted extremity bracelet.  Depression:  - Continue SSRI  HLD:  - Continue statin  GERD: Continue PPI  Thrombocytopenia:  - Continue monitoring.  DVT prophylaxis: SCDs Code Status: DNR Family Communication: None at bedside Disposition Plan: Uncertain  Consultants:   Nephrology  Gastroenterology  Palliative care team  Procedures:   Colonoscopy 09/17/2019:  Impression:       - Anal fissure and hemorrhoids found on digital                            rectal exam.                           - The examined portion of the ileum was normal.                            Biopsied.                           - Diverticulosis in the recto-sigmoid colon, in the                            sigmoid colon and in the descending colon.                           -  Normal mucosa in the entire examined colon.                            Biopsied.                           - Non-bleeding non-thrombosed external and internal                            hemorrhoids. Recommendation:- The patient will be observed post-procedure,                            until all discharge criteria are met.                           - Return patient to hospital ward for ongoing care.                           - Await pathology results.                           - Repeat colonoscopy.                           - Anusol BID (once before bedtime).                           - Sitz bathes QHS.                           - Low residue diet and Renal diet                           - Consider IV Iron infusion.                           - If significant bleeding and transfusion dependent                            anemia develops then would recommend Tagged RBC                            scan over VCE due to the inflammation noted on                            recent CT scan to see if this is source of bleeding.                           - Continue IV Solumedrol  transitioning to 20 mg                            Q12H.                           - The findings and recommendations were discussed  with the patient.                           - The findings and recommendations were discussed                            with the patient's family.  EGD 09/17/2019: Impression:       - No gross lesions in esophagus.                           - Z-line regular, 36 cm from the incisors.                           - Small hiatal hernia.                           - No gross lesions in the stomach. Biopsied.                           - No gross lesions in the duodenal bulb, in the                            first portion of the duodenum and in the second                            portion of the duodenum. Biopsied. Recommendation:- Proceed to scheduled colonoscopy.                           - Observe patient's clinical course.                           - Await pathology results.                           - The findings and recommendations were discussed                            with the patient.                           - The findings and recommendations were discussed                            with the patient's family.  Antimicrobials:  None   Subjective: Following procedure, patient is cold, wants solid food, no abd pain, N/V.   Objective: Vitals:   09/17/19 1150 09/17/19 1200 09/17/19 1442 09/17/19 1444  BP: (!) 175/61 (!) 185/59 (!) 109/50 (!) 109/50  Pulse: 69 63 70 73  Resp: _0 Temp:      TempSrc:      SpO2: 97% 97%  100%  Weight:      Height:        Intake/Output Summary (Last 24 hours) at 09/17/2019 1544 Last data filed at 09/17/2019 1503 Gross per 24 hour  Intake 830.76 ml  Output 1 ml  Net 829.76 ml   [employer]  09/15/19 0500 09/15/19 1245 09/17/19 0420  Weight: 53 kg 52.5 kg 57.5 kg    Gen: 83 y.o. female in no distress  Pulm: Non-labored breathing room air. Clear to auscultation  bilaterally.  CV: Regular rate and rhythm. No murmur, rub, or gallop. No JVD, trace pedal edema. GI: Abdomen soft, non-tender, non-distended, with normoactive bowel sounds. No organomegaly or masses felt. Ext: Warm, Left arm diffuse lymphedema Skin: No other rashes, lesions or ulcers on visualized skin Neuro: Alert and oriented. No focal neurological deficits. Psych: Judgement and insight appear normal. Mood & affect appropriate.   Data Reviewed: I have personally reviewed following labs and imaging studies  CBC: Recent Labs  Lab 09/12/19 0812  09/13/19 0146 09/14/19 0145 09/15/19 0958 09/16/19 0310 09/17/19 0930  WBC 7.4  --  6.6 6.7 7.2 7.9  --   NEUTROABS  --   --   --  4.3 6.3  --   --   HGB 7.6*  --  7.1* 8.7* 9.2* 9.6* 9.9*  HCT 25.9*   < > 23.8* 27.2* 29.2* 30.1* 29.0*  MCV 108.8*  --  109.7* 101.5* 100.7* 101.7*  --   PLT 171  --  127* 110* 128* 131*  --    < > = values in this interval not displayed.   Basic Metabolic Panel: Recent Labs  Lab 09/11/19 1222 09/11/19 1722 09/12/19 0812 09/14/19 0145 09/15/19 0958 09/16/19 0310 09/17/19 0930  NA 133*  --  133* 137 129* 133* 134*  K 3.2*  --  3.2* 3.3* 4.9 4.7 4.4  CL 94*  --  97* 101 97* 97* 96*  CO2 19*  --  _0 --   GLUCOSE 97  --  104* 134* 148* 216* 135*  BUN 43*  --  45* 11 34* 18 34*  CREATININE 6.43*  --  6.76* 2.20* 4.28* 2.59* 3.70*  CALCIUM 8.8*  --  8.1* 6.9* 6.7* 7.5*  --   MG  --  1.9  --   --   --   --   --   PHOS  --  2.2* 2.9  --  3.2  --   --    GFR: Estimated Creatinine Clearance: 9.4 mL/min (A) (by C-G formula based on SCr of 3.7 mg/dL (H)). Liver Function Tests: Recent Labs  Lab 09/12/19 0812 09/15/19 0958  AST 13*  --   ALT 21  --   ALKPHOS 43  --   BILITOT 0.4  --   PROT 4.1*  --   ALBUMIN 1.9* 1.9*   No results for input(s): LIPASE, AMYLASE in the last 168 hours. No results for input(s): AMMONIA in the last 168 hours. Coagulation Profile: Recent Labs  Lab 09/11/19  1722  INR 1.0   Cardiac Enzymes: No results for input(s): CKTOTAL, CKMB, CKMBINDEX, TROPONINI in the last 168 hours. BNP (last 3 results) No results for input(s): PROBNP in the last 8760 hours. HbA1C: No results for input(s): HGBA1C in the last 72 hours. CBG: No results for input(s): GLUCAP in the last 168 hours. Lipid Profile: No results for input(s): CHOL, HDL, LDLCALC, TRIG, CHOLHDL, LDLDIRECT in the last 72 hours. Thyroid Function Tests: No results for input(s): TSH, T4TOTAL, FREET4, T3FREE, THYROIDAB in the last 72 hours. Anemia Panel: No results for input(s): VITAMINB12, FOLATE, FERRITIN, TIBC, IRON, RETICCTPCT in the last 72 hours. Urine analysis:    Component Value Date/Time   COLORURINE YELLOW 08/02/2019 [place] 08/02/2019 2254  LABSPEC 1.010 08/02/2019 2254   PHURINE 5.0 08/02/2019 2254   GLUCOSEU 50 (A) 08/02/2019 2254   HGBUR NEGATIVE 08/02/2019 2254   BILIRUBINUR NEGATIVE 08/02/2019 2254   KETONESUR NEGATIVE 08/02/2019 2254   PROTEINUR NEGATIVE 08/02/2019 2254   UROBILINOGEN 0.2 12/29/2014 1400   NITRITE NEGATIVE 08/02/2019 2254   LEUKOCYTESUR NEGATIVE 08/02/2019 2254   Recent Results (from the past 240 hour(s))  SARS CORONAVIRUS 2 (TAT 6-24 HRS) Nasopharyngeal Nasopharyngeal Swab     Status: None   Collection Time: 09/11/19  4:06 PM   Specimen: Nasopharyngeal Swab  Result Value Ref Range Status   SARS Coronavirus 2 NEGATIVE NEGATIVE Final    Comment: (NOTE) SARS-CoV-2 target nucleic acids are NOT DETECTED. The SARS-CoV-2 RNA is generally detectable in upper and lower respiratory specimens during the acute phase of infection. Negative results do not preclude SARS-CoV-2 infection, do not rule out co-infections with other pathogens, and should not be used as the sole basis for treatment or other patient management decisions. Negative results must be combined with clinical observations, patient history, and epidemiological information. The  expected result is Negative. Fact Sheet for Patients: SugarRoll.be Fact Sheet for Healthcare Providers: https://www.woods-mathews.com/ This test is not yet approved or cleared by the [place] FDA and  has been authorized for detection and/or diagnosis of SARS-CoV-2 by FDA under an Emergency Use Authorization (EUA). This EUA will remain  in effect (meaning this test can be used) for the duration of the COVID-19 declaration under Section 56 4(b)(1) of the Act, 21 U.S.C. section 360bbb-3(b)(1), unless the authorization is terminated or revoked sooner. Performed at [facility], [place] [address] [place], [place] [address]   MRSA PCR Screening     Status: None   Collection Time: 09/12/19  6:50 AM   Specimen: Nasopharyngeal  Result Value Ref Range Status   MRSA by PCR NEGATIVE NEGATIVE Final    Comment:        The GeneXpert MRSA Assay (FDA approved for NASAL specimens only), is one component of a comprehensive MRSA colonization surveillance program. It is not intended to diagnose MRSA infection nor to guide or monitor treatment for MRSA infections. Performed at [facility], [place] [address], [place], [place] [address]       Radiology Studies: Vas [place] Upper Extremity Venous Duplex  Result Date: 09/15/2019 UPPER VENOUS STUDY  Indications: Swelling Limitations: Edema and poor ultrasound/tissue interface. Comparison Study: Prior LUEV study done 08/04/19 is on file for comparison Performing Technologist: Sharion Dove RVS  Examination Guidelines: A complete evaluation includes B-mode imaging, spectral Doppler, color Doppler, and power Doppler as needed of all accessible portions of each vessel. Bilateral testing is considered an integral part of a complete examination. Limited examinations for reoccurring indications may be performed as noted.  Right Findings: +----------+------------+---------+-----------+----------+-------+ RIGHT      CompressiblePhasicitySpontaneousPropertiesSummary +----------+------------+---------+-----------+----------+-------+ Subclavian               Yes       Yes                      +----------+------------+---------+-----------+----------+-------+  Left Findings: +----------+------------+---------+-----------+----------+--------------+ LEFT      CompressiblePhasicitySpontaneousProperties   Summary     +----------+------------+---------+-----------+----------+--------------+ IJV           Full       Yes       Yes                             +----------+------------+---------+-----------+----------+--------------+  Subclavian               Yes       Yes                             +----------+------------+---------+-----------+----------+--------------+ Axillary                 Yes       Yes                             +----------+------------+---------+-----------+----------+--------------+ Brachial      Full       Yes       Yes                             +----------+------------+---------+-----------+----------+--------------+ Radial        Full                                                 +----------+------------+---------+-----------+----------+--------------+ Ulnar                                               Not visualized +----------+------------+---------+-----------+----------+--------------+ Cephalic      Full                                                 +----------+------------+---------+-----------+----------+--------------+ Basilic                                             Not visualized +----------+------------+---------+-----------+----------+--------------+ Interstitial fluid noted throughout  Summary:  Right: No evidence of thrombosis in the subclavian.  Left: No evidence of deep vein thrombosis in the upper extremity. However, unable to visualize the Ulnar and basilic veins. No evidence of superficial vein thrombosis  in the upper extremity. This was a limited study. No change since prior study done 08/04/19.  *See table(s) above for measurements and observations.  Diagnosing physician: Deitra Mayo MD Electronically signed by Deitra Mayo MD on 09/15/2019 at 11:09:17 PM.    Final     Scheduled Meds: . sodium chloride   Intravenous Once  . sodium chloride   Intravenous Once  . allopurinol  100 mg Oral Daily  . atorvastatin  20 mg Oral Daily  . carvedilol  6.25 mg Oral BID WC  . [START ON 09/18/2019] Chlorhexidine Gluconate Cloth  6 each Topical Q0600  . dronabinol  2.5 mg Oral BID AC  . escitalopram  20 mg Oral Daily  . feeding supplement  1 Container Oral TID BM  . feeding supplement (PRO-STAT SUGAR FREE 64)  30 mL Oral BID  . hydrALAZINE  10 mg Oral Q8H  . hydrocortisone  25 mg Rectal BID  . loperamide  2 mg Oral Daily  . methylPREDNISolone (SOLU-MEDROL) injection  20 mg Intravenous Q12H  . multivitamin  1 tablet Oral QHS  . nitroGLYCERIN  0.5 inch Topical Q12H  . olopatadine  1 drop Both Eyes BID  . pantoprazole  40 mg Oral Q0600   Continuous Infusions: . sodium chloride    . sodium chloride       LOS: 6 days   Time spent: 25 minutes.  Patrecia Pour, MD [facility] [email or web address] 09/17/2019, 3:44 PM

## 2019-09-18 ENCOUNTER — Other Ambulatory Visit: Payer: Self-pay | Admitting: Physician Assistant

## 2019-09-18 DIAGNOSIS — K921 Melena: Secondary | ICD-10-CM

## 2019-09-18 DIAGNOSIS — K50019 Crohn's disease of small intestine with unspecified complications: Secondary | ICD-10-CM

## 2019-09-18 DIAGNOSIS — K649 Unspecified hemorrhoids: Secondary | ICD-10-CM

## 2019-09-18 DIAGNOSIS — D649 Anemia, unspecified: Secondary | ICD-10-CM

## 2019-09-18 LAB — COMPREHENSIVE METABOLIC PANEL
ALT: 69 U/L — ABNORMAL HIGH (ref 0–44)
AST: 29 U/L (ref 15–41)
Albumin: 2.1 g/dL — ABNORMAL LOW (ref 3.5–5.0)
Alkaline Phosphatase: 68 U/L (ref 38–126)
Anion gap: 12 (ref 5–15)
BUN: 50 mg/dL — ABNORMAL HIGH (ref 8–23)
CO2: 22 mmol/L (ref 22–32)
Calcium: 7 mg/dL — ABNORMAL LOW (ref 8.9–10.3)
Chloride: 100 mmol/L (ref 98–111)
Creatinine, Ser: 4.59 mg/dL — ABNORMAL HIGH (ref 0.44–1.00)
GFR calc Af Amer: 10 mL/min — ABNORMAL LOW (ref >60)
GFR calc non Af Amer: 8 mL/min — ABNORMAL LOW (ref >60)
Glucose, Bld: 171 mg/dL — ABNORMAL HIGH (ref 70–99)
Potassium: 4.8 mmol/L (ref 3.5–5.1)
Sodium: 134 mmol/L — ABNORMAL LOW (ref 135–145)
Total Bilirubin: 0.6 mg/dL (ref 0.3–1.2)
Total Protein: 4.2 g/dL — ABNORMAL LOW (ref 6.5–8.1)

## 2019-09-18 LAB — CBC
HCT: 30.4 % — ABNORMAL LOW (ref 36.0–46.0)
HCT: 29.1 % — ABNORMAL LOW (ref 36.0–46.0)
Hemoglobin: 9.4 g/dL — ABNORMAL LOW (ref 12.0–15.0)
Hemoglobin: 9 g/dL — ABNORMAL LOW (ref 12.0–15.0)
MCH: 31.4 pg (ref 26.0–34.0)
MCH: 31.5 pg (ref 26.0–34.0)
MCHC: 30.9 g/dL (ref 30.0–36.0)
MCHC: 30.9 g/dL (ref 30.0–36.0)
MCV: 101.7 fL — ABNORMAL HIGH (ref 80.0–100.0)
MCV: 101.7 fL — ABNORMAL HIGH (ref 80.0–100.0)
Platelets: 174 10*3/uL (ref 150–400)
Platelets: 184 10*3/uL (ref 150–400)
RBC: 2.99 MIL/uL — ABNORMAL LOW (ref 3.87–5.11)
RBC: 2.86 MIL/uL — ABNORMAL LOW (ref 3.87–5.11)
RDW: 17.8 % — ABNORMAL HIGH (ref 11.5–15.5)
RDW: 17.9 % — ABNORMAL HIGH (ref 11.5–15.5)
WBC: 11.8 10*3/uL — ABNORMAL HIGH (ref 4.0–10.5)
WBC: 8.3 10*3/uL (ref 4.0–10.5)
nRBC: 0 % (ref 0.0–0.2)
nRBC: 0 % (ref 0.0–0.2)

## 2019-09-18 LAB — RENAL FUNCTION PANEL
Albumin: 1.9 g/dL — ABNORMAL LOW (ref 3.5–5.0)
Anion gap: 16 — ABNORMAL HIGH (ref 5–15)
BUN: 51 mg/dL — ABNORMAL HIGH (ref 8–23)
CO2: 21 mmol/L — ABNORMAL LOW (ref 22–32)
Calcium: 7 mg/dL — ABNORMAL LOW (ref 8.9–10.3)
Chloride: 98 mmol/L (ref 98–111)
Creatinine, Ser: 4.56 mg/dL — ABNORMAL HIGH (ref 0.44–1.00)
GFR calc Af Amer: 10 mL/min — ABNORMAL LOW (ref >60)
GFR calc non Af Amer: 8 mL/min — ABNORMAL LOW (ref >60)
Glucose, Bld: 157 mg/dL — ABNORMAL HIGH (ref 70–99)
Phosphorus: 5.9 mg/dL — ABNORMAL HIGH (ref 2.5–4.6)
Potassium: 4.6 mmol/L (ref 3.5–5.1)
Sodium: 135 mmol/L (ref 135–145)

## 2019-09-18 LAB — SURGICAL PATHOLOGY

## 2019-09-18 MED ORDER — PREDNISONE 20 MG PO TABS: 20.0000 mg | ORAL_TABLET | Freq: Every day | ORAL | Status: DC

## 2019-09-18 MED ORDER — PREDNISONE 10 MG PO TABS: ORAL_TABLET | ORAL | 0 refills | Status: DC

## 2019-09-18 MED ORDER — HYDROCORTISONE ACETATE 25 MG RE SUPP: 25.0000 mg | Freq: Two times a day (BID) | RECTAL | 0 refills | Status: AC

## 2019-09-18 MED ORDER — NITROGLYCERIN 2 % TD OINT: 1.0000 [in_us] | TOPICAL_OINTMENT | Freq: Two times a day (BID) | TRANSDERMAL | 0 refills | Status: AC

## 2019-09-18 MED ORDER — SODIUM CHLORIDE 0.9 % IV SOLN: 100.0000 mL | INTRAVENOUS | Status: DC | PRN

## 2019-09-18 MED ORDER — PREDNISONE 20 MG PO TABS: 20.0000 mg | ORAL_TABLET | Freq: Every day | ORAL | 0 refills | Status: DC

## 2019-09-18 NOTE — Progress Notes (Signed)
Received call from central telemetry stating patient has a 2.1 ST elevation. Patient is asymptomatic, Dr Bonner Puna notified. Will continue to monitor.

## 2019-09-18 NOTE — Progress Notes (Signed)
Daily Rounding Note  09/18/2019, 9:14 AM  LOS: 7 days   SUBJECTIVE:   Chief complaint: Rectal bleeding. Patient feels poorly this morning, weak, dizzy.  Seen during hemodialysis session. No nausea.  No abdominal pain.  OBJECTIVE:         Vital signs in last 24 hours:    Temp:  [97.5 F (36.4 C)-98.6 F (37 C)] 98.6 F (37 C) (12/08 0448) Pulse Rate:  [57-73] 70 (12/08 0448) Resp:  [11-15] 14 (12/08 0448) BP: (109-203)/(50-74) 165/65 (12/08 0617) SpO2:  [97 %-100 %] 99 % (12/08 0448) Weight:  [63.5 kg] 63.5 kg (12/08 0500) Last BM Date: 09/17/19 Filed Weights   09/15/19 1245 09/17/19 0420 09/18/19 0500  Weight: 52.5 kg 57.5 kg 63.5 kg   General: Ashen coloration, looks chronically ill, tired..  Not uncomfortable Heart: RRR. Chest: No labored breathing or cough.  Lungs clear anteriorly. Abdomen: Soft, nontender.  Bowel sounds active. Extremities: Swelling in the left arm.  Attached to dialysis machine via PermCath in right upper chest and right upper extremity AV graft.  Minor amount of blood at site of AV graft. Neuro/Psych: Oriented x3.  No involuntary movements.  Intake/Output from previous day: 12/07 0701 - 12/08 0700 In: 757 [P.O.:240; I.V.:400; IV Piggyback:117] Out: -   Intake/Output this shift: No intake/output data recorded.  Lab Results: Recent Labs    09/16/19 0310 09/17/19 0930 09/18/19 0322 09/18/19 0853  WBC 7.9  --  11.8* 8.3  HGB 9.6* 9.9* 9.4* 9.0*  HCT 30.1* 29.0* 30.4* 29.1*  PLT 131*  --  174 184   BMET Recent Labs    09/15/19 0958 09/16/19 0310 09/17/19 0930 09/18/19 0322  NA 129* 133* 134* 134*  K 4.9 4.7 4.4 4.8  CL 97* 97* 96* 100  CO2 23 26  --  22  GLUCOSE 148* 216* 135* 171*  BUN 34* 18 34* 50*  CREATININE 4.28* 2.59* 3.70* 4.59*  CALCIUM 6.7* 7.5*  --  7.0*   LFT Recent Labs    09/15/19 0958 09/18/19 0322  PROT  --  4.2*  ALBUMIN 1.9* 2.1*  AST  --  29   ALT  --  69*  ALKPHOS  --  68  BILITOT  --  0.6    Studies/Results: No results found.   Scheduled Meds: . allopurinol  100 mg Oral Daily  . atorvastatin  20 mg Oral Daily  . carvedilol  6.25 mg Oral BID WC  . Chlorhexidine Gluconate Cloth  6 each Topical Q0600  . dronabinol  2.5 mg Oral BID AC  . escitalopram  20 mg Oral Daily  . feeding supplement  1 Container Oral TID BM  . feeding supplement (PRO-STAT SUGAR FREE 64)  30 mL Oral BID  . hydrALAZINE  10 mg Oral Q8H  . hydrocortisone  25 mg Rectal BID  . loperamide  2 mg Oral Daily  . methylPREDNISolone (SOLU-MEDROL) injection  20 mg Intravenous Q12H  . multivitamin  1 tablet Oral QHS  . nitroGLYCERIN  0.5 inch Topical Q12H  . olopatadine  1 drop Both Eyes BID  . pantoprazole  40 mg Oral Q0600   Continuous Infusions: . sodium chloride    . sodium chloride     PRN Meds:.sodium chloride, sodium chloride, hydrALAZINE, loperamide, ondansetron **OR** ondansetron (ZOFRAN) IV   ASSESMENT:   *  Minor chronic (a few months) BPR mixed w stool 12/7 Colonoscopy with sup-optimal prep: anal fissure, non-bleeding int/ext  hemorrhoids, recto-sigmoid tics, 2 small polyps cold snared from desc colon O/w normal colon to cecum.   Suspect bleeding is hemorrhoidal.  Day 1-2/3 Anusol HC PR bid , topical NTG PR day 2.    *   Crohns ileitis.  Dx 05/2019.  So far only treated with steroids.   *     Anemia, macrocytic. Low iron, low sats.  Ferritin, b12, folate ok.   Feraheme 03/2019 and 09/17/19.       PLAN   *   Switch solumedrol back to Prednisone, was on 20 mg/day at time of admission.    *   Continue soft, renal diet.   Follow-up with [facility] on 12/30 at 2 PM.       Samantha Mckenzie  09/18/2019, 9:14 AM Phone [phone]

## 2019-09-18 NOTE — Discharge Summary (Signed)
Physician Discharge Summary  Samantha Mckenzie KXF:[id number] DOB: 1936/09/15 DOA: 09/11/2019  PCP: Colon Branch, MD  Admit date: 09/11/2019 Discharge date: 09/18/2019  Admitted From: Home Disposition: Home   Recommendations for Outpatient Follow-up:  1. Follow up with PCP in 1-2 weeks 2. Please obtain BMP/CBC in one week 3. Follow up with nephrology and HD per routine 4. Follow up with Edward Jolly, PA 12/30 at 2:00pm arranged by [facility]. Continue prednisone taper per GI until that time (sent to her pharmacy prednisone 59m daily x2 weeks, then 348mdaily x2 weeks, then planning 2059maily or per GI) 5. Please follow up on the following pending results: Biopsies taken at time of endoscopy 12/7.  Home Health: PT, OT Equipment/Devices: None new (has at home) Discharge Condition: Stable CODE STATUS: DNR Diet recommendation: Heart healthy, renal  Brief/Interim Summary: Samantha Mckenzie an [age]o. female admitted from home on 12/1 due to a syncopal episode. She was getting ready for dialysis when she fell on the floor. She has been having bloody stools for about a month. She had a colonoscopy in August that showed a terminal ileum stricture and diverticulosis in the left colon, biopsies came back suspicious for Crohn's disease she was sent home on 10 weeks of tapering prednisone. She is a recent start on hemodialysis about 1 month prior to admission.  She was admitted to the hospital, received blood transfusions with improved dizziness on standing, worked with PT and progressed with recommendations for home health at discharge. Nephrology was consulted and oversaw routine HD while admitted. GI was consulted, performed colonoscopy and endoscopy on 12/7 (see results below), and made recommendations based on suspicion for hemorrhoidal bleeding. Iron infusion was given and hemoglobin has stabilized, 9.0g/dl at discharge.   Discharge Diagnoses:  Principal Problem:   Symptomatic  anemia Active Problems:   HLD (hyperlipidemia)   Gout   Macrocytic anemia   Essential hypertension   GERD (gastroesophageal reflux disease)   Depression   Crohn's disease of small intestine with rectal bleeding (HCC)   Hypokalemia   [facility] (end stage renal disease) on [facility]   DNR (do not resuscitate)   Malnutrition of moderate degree   Rectal bleeding   Syncope  Crohn's ileitis, diverticulosis:  - Transition IV solumedrol back to prednisone taper 62m20mily >>20mg25mly (down 10mg 50my 2 weeks) per GI.  Internal and external hemorrhoids:  - Anusol, sitz baths, topical NTG  Symptomatic acute blood loss anemia on anemia of chronic renal disease: Possible bleeding from anal fissure and hemorrhoids, no other source of bleeding identified by GI.  - s/p 2u PRBCs earlier this admission. Hgb stable. - IV iron administered 12/7. - Continue PPI, advance diet per GI. [place] [facility]  Syncope, fall at home: Possibly related to decreased EABV with GI bleed. Negative CT head.  - Consider cardiac monitoring as outpatient if recurs.  ESRD:  - Continue TTS HD per nephrology. Used AVG 1:1 w/TDC on 12/8.  - Note RUE should be restricted extremity due to AVG.    HTN: Persistently elevated BPs.  - Continue outpatient medications. UF goal increase per nephrology  Chronic HFpEF: Echo Aug 2018 w/ LVEF 55%, G1DD. - Volume management per nephrology - Continue antihypertensive   Moderate protein calorie malnutrition:  - Supplement with prostat.   Left arm lymphedema with hx left breast CA s/p mastectomy, chemo and XRT:  - Pt declines sleeve. Will elevate extremity, replace  restricted extremity bracelet.  Depression:  - Continue SSRI  HLD:  - Continue statin  GERD: Continue PPI  Thrombocytopenia:  - Continue monitoring.  Discharge Instructions Discharge Instructions    Call MD for:  difficulty breathing, headache or visual  disturbances   Complete by: As directed    Call MD for:  persistant dizziness or light-headedness   Complete by: As directed    Call MD for:  persistant nausea and vomiting   Complete by: As directed    Call MD for:  severe uncontrolled pain   Complete by: As directed    Call MD for:  temperature >100.4   Complete by: As directed    Diet - low sodium heart healthy   Complete by: As directed    Discharge instructions   Complete by: As directed    The source of bleeding is felt to be most likely hemorrhoids and anal fissure. No other definite causes of bleeding were identified. To treat the hemorrhoids, maintain regular, not hard, BMs, apply anusol suppositories as directed and apply nitroglycerin to the rectum twice daily (this helps relax sphincter tone and heal fissures). You may also use sitz baths. If you notice worsening bleeding or you have another fall/passing out at home, you will need to seek medical attention right away. Otherwise, a follow up appointment has been scheduled with GI.   Increase activity slowly   Complete by: As directed      Allergies as of 09/18/2019      Reactions   Codeine Nausea And Vomiting   Morphine Nausea And Vomiting   Oxycodone-acetaminophen Nausea And Vomiting   Sulfonamide Derivatives Nausea And Vomiting   Tramadol Hcl Nausea And Vomiting      Medication List    STOP taking these medications   amLODipine 10 MG tablet Commonly known as: NORVASC     TAKE these medications   acetaminophen 325 MG tablet Commonly known as: TYLENOL Take 2 tablets (650 mg total) by mouth every 6 (six) hours as needed for mild pain, fever or headache (or Fever >/= 101).   allopurinol 100 MG tablet Commonly known as: ZYLOPRIM Take 1 tablet (100 mg total) by mouth daily.   atorvastatin 20 MG tablet Commonly known as: LIPITOR Take 1 tablet (20 mg total) by mouth daily.   CALTRATE 600+D3 PO Take 1 tablet by mouth 2 (two) times daily.   carvedilol 6.25 MG  tablet Commonly known as: COREG Take 1 tablet (6.25 mg total) by mouth 2 (two) times daily with a meal.   CENTRUM SILVER PO Take 1 tablet by mouth daily.   Darbepoetin Alfa 100 MCG/0.5ML Sosy injection Commonly known as: ARANESP Inject 0.5 mLs (100 mcg total) into the vein every Saturday with hemodialysis.   dicyclomine 10 MG capsule Commonly known as: BENTYL Take 2 capsules (20 mg total) by mouth 4 (four) times daily -  before meals and at bedtime. What changed: additional instructions   dronabinol 2.5 MG capsule Commonly known as: Marinol Take 1 capsule (2.5 mg total) by mouth 2 (two) times daily before a meal.   escitalopram 10 MG tablet Commonly known as: Lexapro Take 2 tablets (20 mg total) by mouth daily.   hydrALAZINE 10 MG tablet Commonly known as: APRESOLINE Take 1 tablet (10 mg total) by mouth every 8 (eight) hours.   hydrocortisone 25 MG suppository Commonly known as: ANUSOL-HC Place 1 suppository (25 mg total) rectally 2 (two) times daily for 12 days.   Lasix 40 MG tablet  Generic drug: furosemide Take 40 mg by mouth 2 (two) times daily.   loperamide 2 MG tablet Commonly known as: IMODIUM A-D Take 2 mg by mouth 4 (four) times daily as needed for diarrhea or loose stools.   nitroGLYCERIN 2 % ointment Commonly known as: NITROGLYN Apply 1 inch topically every 12 (twelve) hours. Apply 1 inch topically to rectum every 12 hours   ondansetron 4 MG tablet Commonly known as: ZOFRAN Take 1 tablet (4 mg total) by mouth 4 (four) times daily. What changed: additional instructions   pantoprazole 40 MG tablet Commonly known as: PROTONIX Take 1 tablet (40 mg total) by mouth daily before breakfast.   predniSONE 20 MG tablet Commonly known as: Deltasone Take 1 tablet (20 mg total) by mouth daily with breakfast. What changed:   how much to take  how to take this  when to take this  additional instructions  Another medication with the same name was removed.  Continue taking this medication, and follow the directions you see here.   vitamin B-12 1000 MCG tablet Commonly known as: CYANOCOBALAMIN Take 1,000 mcg by mouth daily.   vitamin C 500 MG tablet Commonly known as: ASCORBIC ACID Take 500 mg by mouth daily.   Vitamin D3 25 MCG (1000 UT) Caps Take 1 capsule by mouth daily.      Follow-up Information    Nicoletta Ba S, PA-C Follow up on 10/10/2019.   Specialty: Gastroenterology Why: 2 PM appointment with Dr. Blanch Media physician assistant.  to follow up Crohns disease.   Contact information: [place] [address] [phone]        Colon Branch, MD Follow up.   Specialty: Internal Medicine Contact information: [address] [place] [address] [phone]          Allergies  Allergen Reactions  . Codeine Nausea And Vomiting  . Morphine Nausea And Vomiting  . Oxycodone-Acetaminophen Nausea And Vomiting  . Sulfonamide Derivatives Nausea And Vomiting  . Tramadol Hcl Nausea And Vomiting    Consultations:  GI  Nephrology  Procedures/Studies: Dg Chest 2 View  Result Date: 09/11/2019 CLINICAL DATA:  Weakness. EXAM: CHEST - 2 VIEW COMPARISON:  May 30, 2019. FINDINGS: The heart size and mediastinal contours are within normal limits. Interval placement of right internal jugular dialysis catheter with distal tip in expected position of right atrium. No pneumothorax or pleural effusion is noted. Mild bibasilar subsegmental atelectasis is noted. The visualized skeletal structures are unremarkable. IMPRESSION: Interval placement of right internal jugular dialysis catheter with distal tip in expected position of right atrium. Mild bibasilar subsegmental atelectasis is noted. Electronically Signed   By: Marijo Conception M.D.   On: 09/11/2019 15:20   Ct Head Wo Contrast  Result Date: 09/11/2019 CLINICAL DATA:  Patient became dizzy and fell. Possible brief loss of consciousness. Patient hit the  back of her head. EXAM: CT HEAD WITHOUT CONTRAST TECHNIQUE: Contiguous axial images were obtained from the base of the skull through the vertex without intravenous contrast. COMPARISON:  05/30/2019 FINDINGS: Brain: No evidence of acute infarction, hemorrhage, hydrocephalus, extra-axial collection or mass lesion/mass effect. There is mild ventricular sulcal enlargement reflecting age related volume loss. Mild periventricular white matter hypoattenuation is also noted consistent with chronic microvascular ischemic change. Vascular: No hyperdense vessel or unexpected calcification. Skull: Normal. Negative for fracture or focal lesion. Sinuses/Orbits: Globes and orbits are unremarkable. The visualized sinuses and mastoid air cells are clear. Other: Small left parietal  scalp hematoma. IMPRESSION: 1. No acute intracranial abnormalities. 2. Age related volume loss. Mild chronic microvascular ischemic change. 3. Small left parietal scalp hematoma. Electronically Signed   By: Lajean Manes M.D.   On: 09/11/2019 15:41   Ct Abdomen Pelvis W Contrast  Result Date: 09/14/2019 CLINICAL DATA:  Crohn's disease, rectal bleeding EXAM: CT ABDOMEN AND PELVIS WITH CONTRAST TECHNIQUE: Multidetector CT imaging of the abdomen and pelvis was performed using the standard protocol following bolus administration of intravenous contrast. CONTRAST:  47m OMNIPAQUE IOHEXOL 300 MG/ML  SOLN COMPARISON:  05/30/2019 FINDINGS: Lower chest: Small bilateral pleural effusions. Associated lower lobe atelectasis. Hepatobiliary: Liver is within normal limits. Gallbladder is unremarkable. No intrahepatic or extrahepatic ductal dilatation. Pancreas: Within normal limits. Spleen: Calcified splenic granulomata. Adrenals/Urinary Tract: Adrenal glands are within normal limits. Bilateral renal cysts, measuring up to 2.3 cm in the lateral right lower kidney. No hydronephrosis. Bladder is underdistended but unremarkable. Stomach/Bowel: Stomach is within normal  limits. No evidence of bowel obstruction. Terminal ileum is within normal limits (series 7/image 47). However, a loop of mid/distal ileum in the right lower quadrant demonstrates wall thickening/inflammatory changes (series 7/image 86), suggesting infectious/inflammatory enteritis, likely related to Crohn's disease given the patient's clinical history. Sigmoid diverticulosis, without evidence of diverticulitis. No rectal wall thickening/inflammatory changes. Vascular/Lymphatic: No evidence of abdominal aortic aneurysm. Atherosclerotic calcifications of the abdominal aorta and branch vessels. No suspicious abdominopelvic lymphadenopathy. Reproductive: Uterus is within normal limits. Bilateral ovaries are unremarkable. Other: Trace right pelvic ascites. No drainable fluid collection/abscess.  No free air. Musculoskeletal: Grade 1 anterolisthesis of L5 on S1. IMPRESSION: Wall thickening/inflammatory changes involving a loop of mid/distal ileum in the right lower quadrant, suggesting infectious/inflammatory enteritis, likely related to active inflammatory Crohn's disease given the patient's clinical history. Trace right pelvic ascites. No drainable fluid collection/abscess. No free air. Small bilateral pleural effusions with associated lower lobe atelectasis. Electronically Signed   By: SJulian HyM.D.   On: 09/14/2019 13:50   Vas UKoreaUpper Extremity Venous Duplex  Result Date: 09/15/2019 UPPER VENOUS STUDY  Indications: Swelling Limitations: Edema and poor ultrasound/tissue interface. Comparison Study: Prior LUEV study done 08/04/19 is on file for comparison Performing Technologist: CSharion DoveRVS  Examination Guidelines: A complete evaluation includes B-mode imaging, spectral Doppler, color Doppler, and power Doppler as needed of all accessible portions of each vessel. Bilateral testing is considered an integral part of a complete examination. Limited examinations for reoccurring indications may be  performed as noted.  Right Findings: +----------+------------+---------+-----------+----------+-------+ RIGHT     CompressiblePhasicitySpontaneousPropertiesSummary +----------+------------+---------+-----------+----------+-------+ Subclavian               Yes       Yes                      +----------+------------+---------+-----------+----------+-------+  Left Findings: +----------+------------+---------+-----------+----------+--------------+ LEFT      CompressiblePhasicitySpontaneousProperties   Summary     +----------+------------+---------+-----------+----------+--------------+ IJV           Full       Yes       Yes                             +----------+------------+---------+-----------+----------+--------------+ Subclavian               Yes       Yes                             +----------+------------+---------+-----------+----------+--------------+  Axillary                 Yes       Yes                             +----------+------------+---------+-----------+----------+--------------+ Brachial      Full       Yes       Yes                             +----------+------------+---------+-----------+----------+--------------+ Radial        Full                                                 +----------+------------+---------+-----------+----------+--------------+ Ulnar                                               Not visualized +----------+------------+---------+-----------+----------+--------------+ Cephalic      Full                                                 +----------+------------+---------+-----------+----------+--------------+ Basilic                                             Not visualized +----------+------------+---------+-----------+----------+--------------+ Interstitial fluid noted throughout  Summary:  Right: No evidence of thrombosis in the subclavian.  Left: No evidence of deep vein thrombosis in the upper  extremity. However, unable to visualize the Ulnar and basilic veins. No evidence of superficial vein thrombosis in the upper extremity. This was a limited study. No change since prior study done 08/04/19.  *See table(s) above for measurements and observations.  Diagnosing physician: Deitra Mayo MD Electronically signed by Deitra Mayo MD on 09/15/2019 at 11:09:17 PM.    Final      Colonoscopy 09/17/2019:  Impression: - Anal fissure and hemorrhoids found on digital  rectal exam. - The examined portion of the ileum was normal.  Biopsied. - Diverticulosis in the recto-sigmoid colon, in the  sigmoid colon and in the descending colon. - Normal mucosa in the entire examined colon.  Biopsied. - Non-bleeding non-thrombosed external and internal  hemorrhoids. Recommendation:- The patient will be observed post-procedure,  until all discharge criteria are met. - Return patient to hospital ward for ongoing care. - Await pathology results. - Repeat colonoscopy. - Anusol BID (once before bedtime). - Sitz bathes QHS. - Low residue diet and Renal diet - Consider IV Iron infusion. - If significant bleeding and transfusion dependent  anemia develops then would recommend Tagged RBC  scan over VCE due to the inflammation noted on  recent CT scan to see if this is source of bleeding. - Continue IV Solumedrol transitioning to 20  mg  Q12H. - The findings and recommendations were discussed  with the patient. - The findings and recommendations were discussed  with the patient's family.  EGD 09/17/2019: Impression: - No gross lesions  in esophagus. - Z-line regular, 36 cm from the incisors. - Small hiatal hernia. - No gross lesions in the stomach. Biopsied. - No gross lesions in the duodenal bulb, in the  first portion of the duodenum and in the second  portion of the duodenum. Biopsied. Recommendation:- Proceed to scheduled colonoscopy. - Observe patient's clinical course. - Await pathology results. - The findings and recommendations were discussed  with the patient. - The findings and recommendations were discussed  with the patient's family.  Subjective: Feels weak during HD. Seen during dialysis. Wants to go home today if able. Denies dyspnea, chest pain, abd pain, rectal pain, N/V/D/constipation or bleeding in stools or elsewhere.  Discharge Exam: Vitals:   09/18/19 1100 09/18/19 1130  BP: (!) 160/43 (!) 185/52  Pulse: 63 63  Resp:    Temp:    SpO2:     General: Pt is alert, awake, not in acute distress Cardiovascular: RRR, S1/S2 +, no rubs, no gallops Respiratory: CTA bilaterally, no wheezing, no rhonchi Abdominal: Soft, NT, ND, bowel sounds + Extremities: LUE with lymphedema diffusely, RUE w/AVG, no LE edema, no cyanosis  Labs: BNP (last 3 results) Recent Labs    04/07/19 2157 08/02/19 2149  BNP 294.1* 956.2*   Basic Metabolic Panel: Recent Labs  Lab  09/11/19 1722 09/12/19 0812 09/14/19 0145 09/15/19 0958 09/16/19 0310 09/17/19 0930 09/18/19 0322 09/18/19 0853  NA  --  133* 137 129* 133* 134* 134* 135  K  --  3.2* 3.3* 4.9 4.7 4.4 4.8 4.6  CL  --  97* 101 97* 97* 96* 100 98  CO2  --  _0 --  22 21*  GLUCOSE  --  104* 134* 148* 216* 135* 171* 157*  BUN  --  45* 11 34* 18 34* 50* 51*  CREATININE  --  6.76* 2.20* 4.28* 2.59* 3.70* 4.59* 4.56*  CALCIUM  --  8.1* 6.9* 6.7* 7.5*  --  7.0* 7.0*  MG 1.9  --   --   --   --   --   --   --   PHOS 2.2* 2.9  --  3.2  --   --   --  5.9*   Liver Function Tests: Recent Labs  Lab 09/12/19 0812 09/15/19 0958 09/18/19 0322 09/18/19 0853  AST 13*  --  29  --   ALT 21  --  69*  --   ALKPHOS 43  --  68  --   BILITOT 0.4  --  0.6  --   PROT 4.1*  --  4.2*  --   ALBUMIN 1.9* 1.9* 2.1* 1.9*   No results for input(s): LIPASE, AMYLASE in the last 168 hours. No results for input(s): AMMONIA in the last 168 hours. CBC: Recent Labs  Lab 09/14/19 0145 09/15/19 0958 09/16/19 0310 09/17/19 0930 09/18/19 0322 09/18/19 0853  WBC 6.7 7.2 7.9  --  11.8* 8.3  NEUTROABS 4.3 6.3  --   --   --   --   HGB 8.7* 9.2* 9.6* 9.9* 9.4* 9.0*  HCT 27.2* 29.2* 30.1* 29.0* 30.4* 29.1*  MCV 101.5* 100.7* 101.7*  --  101.7* 101.7*  PLT 110* 128* 131*  --  174 184   Cardiac Enzymes: No results for input(s): CKTOTAL, CKMB, CKMBINDEX, TROPONINI in the last 168 hours. BNP: Invalid input(s): POCBNP CBG: No results for input(s): GLUCAP in the last 168 hours. D-Dimer No results for input(s): DDIMER in the last 72 hours. Hgb A1c  No results for input(s): HGBA1C in the last 72 hours. Lipid Profile No results for input(s): CHOL, HDL, LDLCALC, TRIG, CHOLHDL, LDLDIRECT in the last 72 hours. Thyroid function studies No results for input(s): TSH, T4TOTAL, T3FREE, THYROIDAB in the last 72 hours.  Invalid input(s): FREET3 Anemia work up No results for input(s): VITAMINB12, FOLATE, FERRITIN, TIBC, IRON,  RETICCTPCT in the last 72 hours. Urinalysis    Component Value Date/Time   COLORURINE YELLOW 08/02/2019 2254   APPEARANCEUR CLEAR 08/02/2019 2254   LABSPEC 1.010 08/02/2019 2254   PHURINE 5.0 08/02/2019 2254   GLUCOSEU 50 (A) 08/02/2019 2254   HGBUR NEGATIVE 08/02/2019 2254   BILIRUBINUR NEGATIVE 08/02/2019 2254   KETONESUR NEGATIVE 08/02/2019 2254   PROTEINUR NEGATIVE 08/02/2019 2254   UROBILINOGEN 0.2 12/29/2014 1400   NITRITE NEGATIVE 08/02/2019 2254   LEUKOCYTESUR NEGATIVE 08/02/2019 2254    Microbiology Recent Results (from the past 240 hour(s))  SARS CORONAVIRUS 2 (TAT 6-24 HRS) Nasopharyngeal Nasopharyngeal Swab     Status: None   Collection Time: 09/11/19  4:06 PM   Specimen: Nasopharyngeal Swab  Result Value Ref Range Status   SARS Coronavirus 2 NEGATIVE NEGATIVE Final    Comment: (NOTE) SARS-CoV-2 target nucleic acids are NOT DETECTED. The SARS-CoV-2 RNA is generally detectable in upper and lower respiratory specimens during the acute phase of infection. Negative results do not preclude SARS-CoV-2 infection, do not rule out co-infections with other pathogens, and should not be used as the sole basis for treatment or other patient management decisions. Negative results must be combined with clinical observations, patient history, and epidemiological information. The expected result is Negative. Fact Sheet for Patients: SugarRoll.be Fact Sheet for Healthcare Providers: https://www.woods-mathews.com/ This test is not yet approved or cleared by the [place] FDA and  has been authorized for detection and/or diagnosis of SARS-CoV-2 by FDA under an Emergency Use Authorization (EUA). This EUA will remain  in effect (meaning this test can be used) for the duration of the COVID-19 declaration under Section 56 4(b)(1) of the Act, 21 U.S.C. section 360bbb-3(b)(1), unless the authorization is terminated or revoked  sooner. Performed at [facility], [address] [place], [place] [address]   MRSA PCR Screening     Status: None   Collection Time: 09/12/19  6:50 AM   Specimen: Nasopharyngeal  Result Value Ref Range Status   MRSA by PCR NEGATIVE NEGATIVE Final    Comment:        The GeneXpert MRSA Assay (FDA approved for NASAL specimens only), is one component of a comprehensive MRSA colonization surveillance program. It is not intended to diagnose MRSA infection nor to guide or monitor treatment for MRSA infections. Performed at [facility], [place] [address] [place], [place] [address]     Time coordinating discharge: Approximately 40 minutes  Patrecia Pour, MD  [facility] 09/18/2019, 11:39 AM

## 2019-09-18 NOTE — Progress Notes (Signed)
Patient discharged to home. Verbalizes understanding of all discharge instructions including discharge medications and follow up MD visits. Patient awaiting ride arrival.

## 2019-09-18 NOTE — Progress Notes (Signed)
PT Cancellation Note  Patient Details Name: MILANY GECK MRN: [id number] DOB: 11/15/1935   Cancelled Treatment:    Reason Eval/Treat Not Completed: Patient at procedure or test/unavailable. Off unit for dialysis. Will follow up when available.    Aeric Burnham 09/18/2019, 12:05 PM

## 2019-09-18 NOTE — Progress Notes (Addendum)
[facility] Progress Note   Subjective:   Seen at onset of dialysis - 3L net UF and tolerating so far. Attempted cannulation of new AVG today - not completely  successful - using in 1:1 with [facility] for dialysis. Has been having BP checked over R AVG as her L arm is also restricted d/t Hx mastectomy. Will place R arm "restricted limb" bracelet and check BP in leg. She denies CP/dyspnea.  Objective Vitals:   09/17/19 2123 09/18/19 0448 09/18/19 0500 09/18/19 0617  BP: (!) 149/62 (!) 165/65  (!) 165/65  Pulse:  70    Resp:  14    Temp:  98.6 F (37 C)    TempSrc:  Oral    SpO2:  99%    Weight:   63.5 kg   Height:       Physical Exam General: Frail appearing woman, NAD. On room air. Heart: RRR; no murmur Lungs: CTA anteriorly Abdomen:soft, non-tender Extremities: No LE edema. L arm 2+ pitting edema (Hx mastectomy), R forearm 1+ edema. Dialysis Access: [facility] in R chest, R AVG + bruit  Additional Objective Labs: Basic Metabolic Panel: Recent Labs  Lab 09/11/19 1722 09/12/19 0812  09/15/19 0958 09/16/19 0310 09/17/19 0930 09/18/19 0322  NA  --  133*   < > 129* 133* 134* 134*  K  --  3.2*   < > 4.9 4.7 4.4 4.8  CL  --  97*   < > 97* 97* 96* 100  CO2  --  24   < > 23 26  --  22  GLUCOSE  --  104*   < > 148* 216* 135* 171*  BUN  --  45*   < > 34* 18 34* 50*  CREATININE  --  6.76*   < > 4.28* 2.59* 3.70* 4.59*  CALCIUM  --  8.1*   < > 6.7* 7.5*  --  7.0*  PHOS 2.2* 2.9  --  3.2  --   --   --    < > = values in this interval not displayed.   Liver Function Tests: Recent Labs  Lab 09/12/19 0812 09/15/19 0958 09/18/19 0322  AST 13*  --  29  ALT 21  --  69*  ALKPHOS 43  --  68  BILITOT 0.4  --  0.6  PROT 4.1*  --  4.2*  ALBUMIN 1.9* 1.9* 2.1*   CBC: Recent Labs  Lab 09/13/19 0146 09/14/19 0145 09/15/19 0958 09/16/19 0310 09/17/19 0930 09/18/19 0322  WBC 6.6 6.7 7.2 7.9  --  11.8*  NEUTROABS  --  4.3 6.3  --   --   --   HGB 7.1* 8.7* 9.2* 9.6* 9.9*  9.4*  HCT 23.8* 27.2* 29.2* 30.1* 29.0* 30.4*  MCV 109.7* 101.5* 100.7* 101.7*  --  101.7*  PLT 127* 110* 128* 131*  --  174   Medications:  . allopurinol  100 mg Oral Daily  . atorvastatin  20 mg Oral Daily  . carvedilol  6.25 mg Oral BID WC  . Chlorhexidine Gluconate Cloth  6 each Topical Q0600  . dronabinol  2.5 mg Oral BID AC  . escitalopram  20 mg Oral Daily  . feeding supplement  1 Container Oral TID BM  . feeding supplement (PRO-STAT SUGAR FREE 64)  30 mL Oral BID  . hydrALAZINE  10 mg Oral Q8H  . hydrocortisone  25 mg Rectal BID  . loperamide  2 mg Oral Daily  . methylPREDNISolone (SOLU-MEDROL)  injection  20 mg Intravenous Q12H  . multivitamin  1 tablet Oral QHS  . nitroGLYCERIN  0.5 inch Topical Q12H  . olopatadine  1 drop Both Eyes BID  . pantoprazole  40 mg Oral Q0600    Dialysis: [facility] TTS   4h  51kg  2/2.25 bath  400/800   Hep none  RIJ TDC/ R AVG (ready for cannulation this week) - Venofer 50 q week -Mircera 225 mcg q 2 weeks, last given 11/25  Assessment/Plan: 1.  Syncope and fall: In setting of GIB, CT head negative.Dizziness may be more vertigo/BPPV. Follow. 2. GI bleed and Crohn's: GI following. S/p 2U PRBC this admit. S/p EGD/colonoscopy 12/7. Hgb stabilizing. For 1 dose Feraheme. 3.  ESRD:Continue HD per TTS sched - HD today - no heparin. Using AVG 1:1 today.   4.  Hypertension:BP high, 3L UF goal. 5. Metabolic Bone Disease:CorrCa/Phos ok. No binders or VDRA 6.Nutrition: Alb exceptionally low - continue  pro-stat (reports Nepro has caused diarrhea in past) 7.  Hx L mastectomy 8.  DNR  Veneta Penton, PA-C 09/18/2019, 8:21 AM  [facility] Pager: [phone]  Pt seen, examined and agree w A/P as above.  Kelly Splinter  MD 09/18/2019, 10:39 AM

## 2019-09-18 NOTE — Progress Notes (Signed)
Physical Therapy Treatment Patient Details Name: Samantha Mckenzie MRN: [id number] DOB: 06/09/1936 Today's Date: 09/18/2019    History of Present Illness 83 yo female admitted after syncopal episode at home when she was getting ready for dialysis and fell and hit her head, unsure if LOC, recently admitted in October for L UE and B LE swelling. PMH includes depression, breast cancer s/p left mastectomy/chemo/radiation, anemia of chronic disease, chrohns ileitis, CAD, ESRD recently started on dialysis    PT Comments    Patient received in bed, agrees to PT. L UE swollen, patient reports it is much better than it was. Reports she is feeling okay, feels a bit dizzy upon sitting. Required min assist to get sitting up on side of bed. Transfers with min guard, ambulated 300 feet with RW, min guard. Slight unsteadiness. Educated patient in safety ( use RW at all times, transition slowly) verbalized understanding. She will continue to benefit from skilled PT to improve strength, safety and independence with mobility.      Follow Up Recommendations  Home health PT;Supervision for mobility/OOB     Equipment Recommendations  None recommended by PT -says she has a walker   Recommendations for Other Services       Precautions / Restrictions Precautions Precautions: Fall Restrictions Weight Bearing Restrictions: No    Mobility  Bed Mobility Overal bed mobility: Needs Assistance Bed Mobility: Supine to Sit     Supine to sit: Min guard Sit to supine: Min assist   General bed mobility comments: required min assist to raise trunk to seated position, required assist to bring LEs back up onto bed.  Transfers Overall transfer level: Needs assistance Equipment used: Rolling walker (2 wheeled)   Sit to Stand: Min guard            Ambulation/Gait Ambulation/Gait assistance: Min guard Gait Distance (Feet): 300 Feet Assistive device: Rolling walker (2 wheeled)   Gait velocity: decreased   General Gait Details: slightly unsteady with ambulation. Reports she is feeling wobbly, had dialysis today.   Stairs             Wheelchair Mobility    Modified Rankin (Stroke Patients Only)       Balance Overall balance assessment: Needs assistance Sitting-balance support: Feet supported Sitting balance-Leahy Scale: Good     Standing balance support: Bilateral upper extremity supported;During functional activity Standing balance-Leahy Scale: Fair Standing balance comment: reliant on external support from RW                            Cognition Arousal/Alertness: Awake/alert Behavior During Therapy: WFL for tasks assessed/performed Overall Cognitive Status: Within Functional Limits for tasks assessed                                        Exercises      General Comments        Pertinent Vitals/Pain Pain Assessment: No/denies pain    Home Living                      Prior Function            PT Goals (current goals can now be found in the care plan section) Acute Rehab PT Goals Patient Stated Goal: get back to walking more PT Goal Formulation: With patient Time For Goal Achievement: 09/27/19 Potential  to Achieve Goals: Good Progress towards PT goals: Progressing toward goals    Frequency    Min 3X/week      PT Plan Current plan remains appropriate    Co-evaluation              AM-PAC PT "6 Clicks" Mobility   Outcome Measure  Help needed turning from your back to your side while in a flat bed without using bedrails?: None Help needed moving from lying on your back to sitting on the side of a flat bed without using bedrails?: A Little Help needed moving to and from a bed to a chair (including a wheelchair)?: A Little Help needed standing up from a chair using your arms (e.g., wheelchair or bedside chair)?: A Little Help needed to walk in hospital room?: A Little Help needed climbing 3-5 steps with  a railing? : A Little 6 Click Score: 19    End of Session Equipment Utilized During Treatment: Gait belt Activity Tolerance: Patient tolerated treatment well Patient left: in bed;with bed alarm set;with call bell/phone within reach Nurse Communication: Mobility status PT Visit Diagnosis: Muscle weakness (generalized) (M62.81);Difficulty in walking, not elsewhere classified (R26.2);History of falling (Z91.81);Dizziness and giddiness (R42);Unsteadiness on feet (R26.81)     Time: 1478-2956 PT Time Calculation (min) (ACUTE ONLY): 21 min  Charges:  $Gait Training: 8-22 mins                     Vonnie Spagnolo, PT, GCS 09/18/19,2:39 PM

## 2019-09-18 NOTE — Consult Note (Signed)
   [facility] Inpatient Consult   09/18/2019  Samantha Mckenzie 08-30-1936 [id number]   Patient screened for extreme high risk score for unplanned readmission score  and for hospitalizations to check if potential [place] [facility] services are needed.  Review of patient's medical record reveals patient is for transition to home today with home health care and with active with [facility] Palliative program.  Chart reviewed for needs.  Called patient's hospital phone without success. Reviewed inpatient [facility] RNCM notes for transition of care.  Primary Care Provider is  Kathlene November, MD with [facility]   Plan:  Follow up with General EMMI at  disposition .    For questions contact:   Natividad Brood, RN BSN [facility] Liaison  [phone] business mobile phone Toll free office [phone]  Fax number: [phone] [name]

## 2019-09-18 NOTE — TOC Initial Note (Addendum)
Transition of Care [facility]) - Initial/Assessment Note    Patient Details  Name: Samantha Mckenzie MRN: [id number] Date of Birth: 06-21-1936  Transition of Care [facility]) CM/SW Contact:    Marilu Favre, RN Phone Number: 09/18/2019, 1:28 PM  Clinical Narrative:                  Confirmed face sheet information with patient. Patient has walker at home needs 3 in 1 same ordered.   Provided choice, patient would like [place] left Sharmon Revere a message awaiting call back , Malachy Mood has accepted referral. Expected Discharge Plan: [place] Barriers to Discharge: No Barriers Identified   Patient Goals and CMS Choice Patient states their goals for this hospitalization and ongoing recovery are:: to go home CMS Medicare.gov Compare Post Acute Care list provided to:: Patient Choice offered to / list presented to : Patient  Expected Discharge Plan and Services Expected Discharge Plan: [place] In-house Referral: Clinical Social Work, Hospice / [place], [facility] Discharge Planning Services: CM Consult Post Acute Care Choice: [place] arrangements for the past 2 months: Single Family Home Expected Discharge Date: 09/18/19               DME Arranged: 3-N-1 DME Agency: [employer] Date DME Agency Contacted: 09/18/19 Time DME Agency Contacted: [phone] Representative spoke with at DME Agency: [place] Arranged: PT, OT [place] Agency: [place] Date [place]: 09/18/19 Time [place]: 23 Representative spoke with at [place]: Malachy Mood left message  Prior Living Arrangements/Services Living arrangements for the past 2 months: Single Family Home Lives with:: Spouse Patient language and need for interpreter reviewed:: Yes Do you feel safe going back to the place where you live?: Yes      Need for Family Participation in Patient Care: Yes (Comment) Care giver support system in place?: Yes  (comment) Current home services: DME Criminal Activity/Legal Involvement Pertinent to Current Situation/Hospitalization: No - Comment as needed  Activities of Daily Living Home Assistive Devices/Equipment: Gilford Rile (specify type) ADL Screening (condition at time of admission) Patient's cognitive ability adequate to safely complete daily activities?: No Is the patient deaf or have difficulty hearing?: No Does the patient have difficulty seeing, even when wearing glasses/contacts?: No Does the patient have difficulty concentrating, remembering, or making decisions?: Yes Patient able to express need for assistance with ADLs?: Yes Does the patient have difficulty dressing or bathing?: No Independently performs ADLs?: Yes (appropriate for developmental age) Does the patient have difficulty walking or climbing stairs?: Yes Weakness of Legs: Both Weakness of Arms/Hands: None  Permission Sought/Granted Permission sought to share information with : Facility Sport and exercise psychologist, Family Supports Permission granted to share information with : Yes, Verbal Permission Granted  Share Information with NAME: Andrell Tallman; Jodi Criscuolo  Permission granted to share info w AGENCY: [facility] , [place] granted to share info w Relationship: son; husband  Permission granted to share info w Contact Information: [phone] (son)  Emotional Assessment Appearance:: Appears stated age Attitude/Demeanor/Rapport: Engaged Affect (typically observed): Accepting Orientation: : Oriented to Self, Oriented to Place, Oriented to  Time, Oriented to Situation Alcohol / Substance Use: Not Applicable Psych Involvement: No (comment)  Admission diagnosis:  Closed head injury, initial encounter [S09.90XA] Gastrointestinal hemorrhage, unspecified gastrointestinal hemorrhage type [K92.2] Syncope, unspecified syncope type [R55] Patient Active Problem List   Diagnosis Date Noted  . Syncope 09/15/2019  .  Rectal bleeding   .  Malnutrition of moderate degree 09/14/2019  . DNR (do not resuscitate) 09/13/2019  . Symptomatic anemia 09/11/2019  . ESRD (end stage renal disease) on dialysis ([place])   . GI bleed   . Cellulitis of arm, left 08/02/2019  . Acidosis   . Dementia ([place]) 06/17/2019  . Abnormal CT scan, small bowel   . Dehydration 05/31/2019  . Acute blood loss anemia   . Ileitis 05/30/2019  . Terminal ileitis with complication ([place]) 04/59/9774  . COPD (chronic obstructive pulmonary disease) ([place]) 04/12/2019  . Crohn's disease of small intestine with rectal bleeding ([place]) 04/07/2019  . Chronic diastolic CHF (congestive heart failure) ([place]) 04/07/2019  . Hypokalemia 04/07/2019  . Hypocalcemia 04/07/2019  . Fall 04/07/2019  . Occult blood positive stool 04/07/2019  . Adjustment disorder with depressed mood   . Generalized abdominal pain   . Dysphasia 03/21/2019  . CAP (community acquired pneumonia) 03/20/2019  . Nausea vomiting and diarrhea 03/20/2019  . Acute respiratory failure with hypoxia ([place]) 03/20/2019  . Acute renal failure superimposed on stage 4 chronic kidney disease ([place]) 03/20/2019  . Lactic acidosis 03/20/2019  . Diabetic peripheral neuropathy associated with type 2 diabetes mellitus ([place]) 03/20/2019  . Hypomagnesemia 06/21/2018  . Generalized weakness 06/20/2018  . PCP NOTES >>> 07/11/2015  . Sepsis ([place]) 04/19/2013  . Depression   . VITAMIN B12 DEFICIENCY 04/01/2008  . GERD (gastroesophageal reflux disease) 12/19/2007  . NEOP, MALIGNANT, FEMALE BREAST NOS 07/07/2007  . Osteoarthritis 07/07/2007  . Diabetes mellitus type 2 with complications ([place]) 14/23/9532  . HLD (hyperlipidemia) 02/28/2007  . Gout 02/28/2007  . Macrocytic anemia 02/28/2007  . Essential hypertension 02/28/2007   PCP:  Colon Branch, MD Pharmacy:   [place], [place] [address] [place] [address] Phone: [phone] Fax:  [place] [address], [place]. [place]. [place] [address] Phone: [phone] Fax: [phone]     Social Determinants of Health (SDOH) Interventions    Readmission Risk Interventions Readmission Risk Prevention Plan 09/12/2019 08/03/2019 08/03/2019  Transportation Screening Complete Complete -  PCP or Specialist Appt within 3-5 Days - - -  HRI or [place] for [place] - - -  Medication Review (RN Care Manager) Referral to Pharmacy Referral to Pharmacy -  PCP or Specialist appointment within 3-5 days of discharge Not Complete Complete -  PCP/Specialist Appt Not Complete comments plan for disposition pending - -  HRI or Home Care Consult Complete Complete -  SW Recovery Care/Counseling Consult Complete Complete Complete  Palliative Care Screening Complete Complete Complete  Skilled Nursing Facility Complete Not Applicable -  Some recent data might be hidden

## 2019-09-18 NOTE — Progress Notes (Signed)
OT Cancellation Note  Patient Details Name: Samantha Mckenzie MRN: [id number] DOB: 1936/06/03   Cancelled Treatment: Attempted to see patient this AM however patient off the floor at dialysis. Will re-attempt as time permits.  [place] OT office: [phone]

## 2019-09-19 ENCOUNTER — Telehealth: Payer: Self-pay | Admitting: *Deleted

## 2019-09-19 ENCOUNTER — Telehealth: Payer: Self-pay | Admitting: Internal Medicine

## 2019-09-19 NOTE — Telephone Encounter (Signed)
Pt's family member dropped off document to be filled out by provider ([employer]) Pt's family member would like to be called when ready at [phone]. Document put at front office tray under providers name.

## 2019-09-19 NOTE — Telephone Encounter (Signed)
Form placed in PCP red folder.

## 2019-09-19 NOTE — Telephone Encounter (Signed)
Transition Care Management Follow-up Telephone Call--Call done with Pt's husband. Okay'd by pt.    Date discharged?09/18/19   How have you been since you were released from the hospital? Very weak   Do you understand why you were in the hospital? yes   Do you understand the discharge instructions? yes  Where were you discharged to?home w/ husband  Items Reviewed:  Medications reviewed: pt's husband states he will have to find list  Allergies reviewed: husband states no changes  Dietary changes reviewed: yes  Referrals reviewed: yes   Functional Questionnaire:   Activities of Daily Living (ADLs):   She states they are independent in the following: ambulation, bathing and hygiene, feeding, continence, grooming, toileting and dressing States they require assistance with the following: uses walker   Any transportation issues/concerns?: no   Any patient concerns? Just that pt is still weak   Confirmed importance and date/time of follow-up visits scheduled yes  Provider Appointment booked with PCP 09/24/19 @1120   Confirmed with patient if condition begins to worsen call PCP or go to the ER.  Patient was given the office number and encouraged to call back with question or concerns.  : yes

## 2019-09-20 ENCOUNTER — Encounter: Payer: Self-pay | Admitting: Gastroenterology

## 2019-09-20 ENCOUNTER — Other Ambulatory Visit: Payer: TRICARE For Life (TFL) | Admitting: *Deleted

## 2019-09-20 DIAGNOSIS — N2581 Secondary hyperparathyroidism of renal origin: Secondary | ICD-10-CM | POA: Diagnosis not present

## 2019-09-20 DIAGNOSIS — D631 Anemia in chronic kidney disease: Secondary | ICD-10-CM | POA: Diagnosis not present

## 2019-09-20 DIAGNOSIS — Z992 Dependence on renal dialysis: Secondary | ICD-10-CM | POA: Diagnosis not present

## 2019-09-20 DIAGNOSIS — Z23 Encounter for immunization: Secondary | ICD-10-CM | POA: Diagnosis not present

## 2019-09-20 DIAGNOSIS — F329 Major depressive disorder, single episode, unspecified: Secondary | ICD-10-CM | POA: Diagnosis not present

## 2019-09-20 DIAGNOSIS — D62 Acute posthemorrhagic anemia: Secondary | ICD-10-CM | POA: Diagnosis not present

## 2019-09-20 DIAGNOSIS — E1129 Type 2 diabetes mellitus with other diabetic kidney complication: Secondary | ICD-10-CM | POA: Diagnosis not present

## 2019-09-20 DIAGNOSIS — Z853 Personal history of malignant neoplasm of breast: Secondary | ICD-10-CM | POA: Diagnosis not present

## 2019-09-20 DIAGNOSIS — Z0279 Encounter for issue of other medical certificate: Secondary | ICD-10-CM

## 2019-09-20 DIAGNOSIS — I120 Hypertensive chronic kidney disease with stage 5 chronic kidney disease or end stage renal disease: Secondary | ICD-10-CM | POA: Diagnosis not present

## 2019-09-20 DIAGNOSIS — E440 Moderate protein-calorie malnutrition: Secondary | ICD-10-CM | POA: Diagnosis not present

## 2019-09-20 DIAGNOSIS — R55 Syncope and collapse: Secondary | ICD-10-CM | POA: Diagnosis not present

## 2019-09-20 DIAGNOSIS — D509 Iron deficiency anemia, unspecified: Secondary | ICD-10-CM | POA: Diagnosis not present

## 2019-09-20 DIAGNOSIS — Z515 Encounter for palliative care: Secondary | ICD-10-CM

## 2019-09-20 DIAGNOSIS — I972 Postmastectomy lymphedema syndrome: Secondary | ICD-10-CM | POA: Diagnosis not present

## 2019-09-20 DIAGNOSIS — N186 End stage renal disease: Secondary | ICD-10-CM | POA: Diagnosis not present

## 2019-09-20 DIAGNOSIS — M1030 Gout due to renal impairment, unspecified site: Secondary | ICD-10-CM | POA: Diagnosis not present

## 2019-09-20 DIAGNOSIS — Z9012 Acquired absence of left breast and nipple: Secondary | ICD-10-CM | POA: Diagnosis not present

## 2019-09-20 DIAGNOSIS — E876 Hypokalemia: Secondary | ICD-10-CM | POA: Diagnosis not present

## 2019-09-20 DIAGNOSIS — K50011 Crohn's disease of small intestine with rectal bleeding: Secondary | ICD-10-CM | POA: Diagnosis not present

## 2019-09-20 NOTE — Progress Notes (Signed)
COMMUNITY PALLIATIVE CARE RN NOTE  PATIENT NAME: Samantha Mckenzie DOB: [birth year] MRN: [id number]  PRIMARY CARE PROVIDER: Colon Branch, MD  RESPONSIBLE PARTY:  Acct ID - Guarantor Home Phone Work Phone Relationship Acct Type  [id number] OLESYA, [phone]  Self P/F     [address] [place], [place] [address]   Due to the COVID-19 crisis, this virtual check-in visit was done via telephone from my office and it was initiated and consent by this patient and or family.  PLAN OF CARE and INTERVENTION:  1. ADVANCE CARE PLANNING/GOALS OF CARE: Goal is for patient to remain in her home for as long as possible. She has a DNR. 2. PATIENT/CAREGIVER EDUCATION: Symptom Management 3. DISEASE STATUS: Virtual check-in visit completed via telephone. Patient has just returned home from dialysis. She denies pain at this time. She is currently finishing eating some dinner. Husband is concerned about patient, as he feels that she has become much weaker and now requires assistance with bathing and dressing since returning home from the hospital. She was hospitalized from 09/11/19 to 09/18/19 after a syncopal episode resulting in patient falling and hitting her head. She had been noticing blood in her stool for the past month and found to be anemic. She has received a blood transfusion and iron transfusion during admission. Last Hgb 9.0. She had a recent fall since she has returned from the hospital without injury. EMS was contacted to get her off of the floor. They had an initial visit with home health this am to start therapy. I scheduled an in person visit for tomorrow at 3:00 pm for follow up. Will continue to monitor.  HISTORY OF PRESENT ILLNESS: This is a 83 yo female who resides at home with her husband. Palliative care team continues to follow patient. Next visit scheduled tomorrow.    CODE STATUS: DNR ADVANCED DIRECTIVES: Y MOST FORM: yes PPS: 40%  (Duration of visit and documentation 30  minutes)  Daryl Eastern, RN BSN

## 2019-09-20 NOTE — Telephone Encounter (Signed)
Completed to the best of my ability

## 2019-09-20 NOTE — Telephone Encounter (Signed)
Spoke w/ Pt's son [name]- informed paperwork ready for pick up at front desk. Copy sent for scanning.

## 2019-09-21 ENCOUNTER — Telehealth: Payer: Self-pay | Admitting: Internal Medicine

## 2019-09-21 ENCOUNTER — Other Ambulatory Visit: Payer: Self-pay | Admitting: *Deleted

## 2019-09-21 ENCOUNTER — Other Ambulatory Visit: Payer: Medicare Other | Admitting: *Deleted

## 2019-09-21 ENCOUNTER — Other Ambulatory Visit: Payer: Self-pay

## 2019-09-21 DIAGNOSIS — Z515 Encounter for palliative care: Secondary | ICD-10-CM

## 2019-09-21 NOTE — Telephone Encounter (Signed)
Spoke w/ Mickel Baas- verbal orders given.

## 2019-09-21 NOTE — Telephone Encounter (Signed)
[place]   Caller/Agency: Dwyane Luo  [facility] Number: [phone] Requesting OT/PT/Skilled Nursing/Social Work/Speech Therapy: Physical Therapy  Frequency: 3x1 2x6  Also medical social work evaluation which will happen next week

## 2019-09-21 NOTE — Patient Outreach (Signed)
[facility]) Care Management  09/21/2019  TOMEIKA WEINMANN 03/31/1936 [id number]   RED ON EMMI ALERT - General Discharge Day # 1 Date: 09/20/2019 Red Alert Reason: Not having discharge papers, unsure of new prescriptions, and unsure of who to call with changes in condition  Outreach attempt #1, successful.  Referral received due to above noted red EMMI alert.  Member recently discharged from hospital with diagnosis of symptomatic anemia, primary MD office will complete TOC assessment.  Per chart, she also has history of HTN, CHF, COPD, GERD, Crohn's, diabetes, ESRD on dialysis Tue/Thur/Sat, hyperlipidemia, and dementia.  Call placed to member, she verifies identity.  This care manager introduced self and stated purpose of call.  [facility] care management services explained.  She report living with her husband but state they are having a hard time managing their care independently.  They have both had multiple falls and are requiring assistance for their son.  Denies any falls since discharge.  State her husband is a English as a second language teacher and they have [place] for life, making them eligible for VA services (Aid and Attendance).  Agrees to have social worker follow up and assist with application process.    Report son manages most of her medical conditions but she also has home health and home based palliative care through [facility], last visit was yesterday.  Member report she has a nurse visiting weekly, unable to review medications as she state the nurse manages her pill box weekly.    She has follow up appointments with PCP next week and with GI on 12/30.  Denies need for transportation assistance.  Denies any urgent concerns at this time but agrees to follow up.   Plan: RN CM will place referral to social worker and follow up with member and/or son within the next 2 weeks.  Fall Risk  09/21/2019 07/10/2019 05/11/2019 06/13/2018 09/10/2016  Falls in the past year? 1 1 (No Data) No No  Comment  - - Emmi Telephone Survey: data to providers prior to load - -  Number falls in past yr: 1 0 (No Data) - -  Comment - - Emmi Telephone Survey Actual Response =  - -  Injury with Fall? 0 1 - - -  Risk for fall due to : Impaired balance/gait;History of fall(s) Impaired balance/gait - - -  Follow up Falls prevention discussed Falls prevention discussed - - -   Depression screen [facility] 2/9 09/21/2019 06/13/2018 09/10/2016 07/11/2015 10/09/2014  Decreased Interest 0 1 0 0 0  Down, Depressed, Hopeless 1 1 0 0 0  PHQ - 2 Score 1 2 0 0 0  Altered sleeping - 0 - - -  Tired, decreased energy - 3 - - -  Change in appetite - 3 - - -  Feeling bad or failure about yourself  - 0 - - -  Trouble concentrating - 1 - - -  Moving slowly or fidgety/restless - 0 - - -  Suicidal thoughts - 0 - - -  PHQ-9 Score - 9 - - -  Difficult doing work/chores - Somewhat difficult - - -  Some recent data might be hidden   [facility] CM Care Plan Problem One     Most Recent Value  Care Plan Problem One  Risk for fall related to anemia as evidenced by recent hospitalization for anemia and falls  Role Documenting the Problem One  Care Management Coordinator  Care Plan for Problem One  Active  [facility] Long Term Goal  Member will not be readmitted to hospital within the next 31 days  [facility] Long Term Goal Start Date  09/21/19  Interventions for Problem One Long Term Goal  Reviewed discharge instructions with member.  Educated on importance of following plan of care, including working with home health for PT in effort to increase strength  [facility] CM Short Term Goal #1   Member will report follow up appointments with primary MD and GI within the next 3 weeks  [facility] CM Short Term Goal #1 Start Date  09/21/19  Interventions for Short Term Goal #1  Upcoming appointments reviewed with member, confirmed she will have transportation to appointments  [facility] CM Short Term Goal #2   Member will report having a plan to increase support in the home within the  next 4 weeks  [facility] CM Short Term Goal #2 Start Date  09/21/19  Interventions for Short Term Goal #2  Referral placed to social worker for community resources and assistance with getting support from [place], Therapist, sports, MSN [place] Manager [phone]

## 2019-09-22 DIAGNOSIS — N2581 Secondary hyperparathyroidism of renal origin: Secondary | ICD-10-CM | POA: Diagnosis not present

## 2019-09-22 DIAGNOSIS — Z992 Dependence on renal dialysis: Secondary | ICD-10-CM | POA: Diagnosis not present

## 2019-09-22 DIAGNOSIS — E1129 Type 2 diabetes mellitus with other diabetic kidney complication: Secondary | ICD-10-CM | POA: Diagnosis not present

## 2019-09-22 DIAGNOSIS — N186 End stage renal disease: Secondary | ICD-10-CM | POA: Diagnosis not present

## 2019-09-22 DIAGNOSIS — D509 Iron deficiency anemia, unspecified: Secondary | ICD-10-CM | POA: Diagnosis not present

## 2019-09-22 DIAGNOSIS — D631 Anemia in chronic kidney disease: Secondary | ICD-10-CM | POA: Diagnosis not present

## 2019-09-24 ENCOUNTER — Other Ambulatory Visit: Payer: Self-pay

## 2019-09-24 ENCOUNTER — Ambulatory Visit (INDEPENDENT_AMBULATORY_CARE_PROVIDER_SITE_OTHER): Payer: Medicare Other | Admitting: Internal Medicine

## 2019-09-24 ENCOUNTER — Encounter: Payer: Self-pay | Admitting: Internal Medicine

## 2019-09-24 VITALS — BP 130/48 | HR 65 | Temp 96.3°F | Resp 18 | Ht 62.0 in

## 2019-09-24 DIAGNOSIS — D631 Anemia in chronic kidney disease: Secondary | ICD-10-CM | POA: Diagnosis not present

## 2019-09-24 DIAGNOSIS — N189 Chronic kidney disease, unspecified: Secondary | ICD-10-CM

## 2019-09-24 DIAGNOSIS — N186 End stage renal disease: Secondary | ICD-10-CM | POA: Diagnosis not present

## 2019-09-24 DIAGNOSIS — F329 Major depressive disorder, single episode, unspecified: Secondary | ICD-10-CM | POA: Diagnosis not present

## 2019-09-24 DIAGNOSIS — I120 Hypertensive chronic kidney disease with stage 5 chronic kidney disease or end stage renal disease: Secondary | ICD-10-CM | POA: Diagnosis not present

## 2019-09-24 DIAGNOSIS — I10 Essential (primary) hypertension: Secondary | ICD-10-CM

## 2019-09-24 DIAGNOSIS — R55 Syncope and collapse: Secondary | ICD-10-CM | POA: Diagnosis not present

## 2019-09-24 DIAGNOSIS — K50011 Crohn's disease of small intestine with rectal bleeding: Secondary | ICD-10-CM | POA: Diagnosis not present

## 2019-09-24 DIAGNOSIS — D62 Acute posthemorrhagic anemia: Secondary | ICD-10-CM | POA: Diagnosis not present

## 2019-09-24 DIAGNOSIS — R531 Weakness: Secondary | ICD-10-CM

## 2019-09-24 DIAGNOSIS — D649 Anemia, unspecified: Secondary | ICD-10-CM

## 2019-09-24 DIAGNOSIS — F32A Depression, unspecified: Secondary | ICD-10-CM

## 2019-09-24 NOTE — Patient Instructions (Signed)
   GO TO THE FRONT DESK Schedule your next appointment    for a checkup in 4 months  Get a supplement called Pro-Stat after you counseled the nutritionist at the hemodialysis center.  Will help you build the protein in your system

## 2019-09-24 NOTE — Progress Notes (Signed)
Pre visit review using our clinic review tool, if applicable. No additional management support is needed unless otherwise documented below in the visit note. 

## 2019-09-24 NOTE — Progress Notes (Signed)
Subjective:    Patient ID: Samantha Mckenzie, female    DOB: [birth year], 83 y.o.   MRN: [id number]  DOS:  09/24/2019 Type of visit - description: TCM   Admitted to the hospital and discharged a week later on 09/18/2019  Was admitted with syncope, was diagnosed with symptomatic anemia. She has a history of Crohn's ileitis and diverticulitis. She received IV Solu-Medrol and then prednisone by mouth  Had a EGD colonoscopy 09/17/2019 The portion of the ileum that was exam was normal. Normal mucosa of the entire colon. Internal and external hemorrhoids  Biopsies reviewed.   Gastric: Chronic gastritis, no H. pylori, duodenum normal. Ileal mucosa: Negative Colon Polyp: Low-grade dysplasia. Please see full reports  Had 2 PRBCs and IV iron. RX PPIs.  HTN: Persistently elevated BP at the hospital  HFpEF: Managed by nephrology  protein calorie malnutrition Was recommended protein supplements    Review of Systems Since she left the hospital she is at home with her husband. Reports good compliance with medications No fever chills No chest pain no difficulty breathing No nausea, vomiting, diarrhea.  Not constipated.  Does not know the color of her stools. She looks very pale and feels weak but that she has no severe or acute symptoms related to anemia: Today she was able to get out of bed, get dressed and come to the office without major problems with orthostatic dizziness Complain of lower extremity and left upper extremity edema, worse than baseline.   Past Medical History:  Diagnosis Date  . Allergic rhinitis   . Anemia   . Breast CA ([place])    surgery, chemo, XRT; had peripheral neuropathy (imbalance at times) after chemo  . Cellulitis    Left arm, recurrent   . Cellulitis LEFT arm recurrent 12/08/2011  . Chronic renal disease, stage IV (HCC)    Dr. Moshe Cipro  . Depression   . Fatigue   . GERD (gastroesophageal reflux disease)    gastritis, EGD 02/2007  . Gout   .  Hyperlipidemia   . Hypertension   . Macular degeneration 2011  . Normal cardiac stress test 12/2014  . Osteoarthritis   . Renal insufficiency   . Syncope 09/12/2019    Past Surgical History:  Procedure Laterality Date  . AV FISTULA PLACEMENT Right 08/10/2019   Procedure: ARTERIOVENOUS (AV) FISTULA CREATION VERSUS GRAFT PLACEMENT;  Surgeon: Angelia Mould, MD;  Location: [place];  Service: Vascular;  Laterality: Right;  . BIOPSY  06/06/2019   Procedure: BIOPSY;  Surgeon: Ladene Artist, MD;  Location: [facility];  Service: Endoscopy;;  . BIOPSY  09/17/2019   Procedure: BIOPSY;  Surgeon: Irving Copas., MD;  Location: [facility] ENDOSCOPY;  Service: Gastroenterology;;  . cataracts bilaterally  1 -2012  . COLONOSCOPY WITH PROPOFOL N/A 06/06/2019   Procedure: COLONOSCOPY WITH PROPOFOL;  Surgeon: Ladene Artist, MD;  Location: [facility] ENDOSCOPY;  Service: Endoscopy;  Laterality: N/A;  . COLONOSCOPY WITH PROPOFOL N/A 09/17/2019   Procedure: COLONOSCOPY WITH PROPOFOL;  Surgeon: Rush Landmark Telford Nab., MD;  Location: [place];  Service: Gastroenterology;  Laterality: N/A;  . ESOPHAGOGASTRODUODENOSCOPY (EGD) WITH PROPOFOL N/A 09/17/2019   Procedure: ESOPHAGOGASTRODUODENOSCOPY (EGD) WITH PROPOFOL;  Surgeon: Rush Landmark Telford Nab., MD;  Location: [place];  Service: Gastroenterology;  Laterality: N/A;  . FLEXIBLE SIGMOIDOSCOPY N/A 09/15/2019   Procedure: FLEXIBLE SIGMOIDOSCOPY;  Surgeon: Yetta Flock, MD;  Location: [place];  Service: Gastroenterology;  Laterality: N/A;  . IR FLUORO GUIDE CV LINE RIGHT  08/03/2019  .  IR US GUIDE VASC ACCESS RIGHT  08/03/2019  . LUNG BIOPSY  1999   neg  . MASTECTOMY Left 1999   and lymphnodes   . POLYPECTOMY  09/17/2019   Procedure: POLYPECTOMY;  Surgeon: Mansouraty, Telford Nab., MD;  Location: [facility] ENDOSCOPY;  Service: Gastroenterology;;    Social History   Socioeconomic History  . Marital status: Married    Spouse name: Not on file    . Number of children: 2  . Years of education: Not on file  . Highest education level: Not on file  Occupational History  . Occupation: retired     Fish farm manager: RETIRED  Tobacco Use  . Smoking status: Never Smoker  . Smokeless tobacco: Never Used  Substance and Sexual Activity  . Alcohol use: Not Currently    Alcohol/week: 0.0 standard drinks    Comment: rarely  . Drug use: No  . Sexual activity: Not Currently  Other Topics Concern  . Not on file  Social History Narrative   Lives w/ husband , 2 children in [place], 2 [place]           Social Determinants of Health   Financial Resource Strain:   . Difficulty of Paying Living Expenses: Not on file  Food Insecurity:   . Worried About Charity fundraiser in the Last Year: Not on file  . Ran Out of Food in the Last Year: Not on file  Transportation Needs: No Transportation Needs  . Lack of Transportation (Medical): No  . Lack of Transportation (Non-Medical): No  Physical Activity:   . Days of Exercise per Week: Not on file  . Minutes of Exercise per Session: Not on file  Stress:   . Feeling of Stress : Not on file  Social Connections:   . Frequency of Communication with Friends and Family: Not on file  . Frequency of Social Gatherings with Friends and Family: Not on file  . Attends Religious Services: Not on file  . Active Member of Clubs or Organizations: Not on file  . Attends Archivist Meetings: Not on file  . Marital Status: Not on file  Intimate Partner Violence:   . Fear of Current or Ex-Partner: Not on file  . Emotionally Abused: Not on file  . Physically Abused: Not on file  . Sexually Abused: Not on file      Allergies as of 09/24/2019      Reactions   Codeine Nausea And Vomiting   Morphine Nausea And Vomiting   Oxycodone-acetaminophen Nausea And Vomiting   Sulfonamide Derivatives Nausea And Vomiting   Tramadol Hcl Nausea And Vomiting      Medication List       Accurate as of September 24, 2019   5:14 PM. If you have any questions, ask your nurse or doctor.        acetaminophen 325 MG tablet Commonly known as: TYLENOL Take 2 tablets (650 mg total) by mouth every 6 (six) hours as needed for mild pain, fever or headache (or Fever >/= 101).   allopurinol 100 MG tablet Commonly known as: ZYLOPRIM Take 1 tablet (100 mg total) by mouth daily.   atorvastatin 20 MG tablet Commonly known as: LIPITOR Take 1 tablet (20 mg total) by mouth daily.   CALTRATE 600+D3 PO Take 1 tablet by mouth 2 (two) times daily.   carvedilol 6.25 MG tablet Commonly known as: COREG Take 1 tablet (6.25 mg total) by mouth 2 (two) times daily with a meal.   CENTRUM  SILVER PO Take 1 tablet by mouth daily.   Darbepoetin Alfa 100 MCG/0.5ML Sosy injection Commonly known as: ARANESP Inject 0.5 mLs (100 mcg total) into the vein every Saturday with hemodialysis.   dicyclomine 10 MG capsule Commonly known as: BENTYL Take 2 capsules (20 mg total) by mouth 4 (four) times daily -  before meals and at bedtime. What changed: additional instructions   dronabinol 2.5 MG capsule Commonly known as: Marinol Take 1 capsule (2.5 mg total) by mouth 2 (two) times daily before a meal.   escitalopram 10 MG tablet Commonly known as: Lexapro Take 2 tablets (20 mg total) by mouth daily.   hydrALAZINE 10 MG tablet Commonly known as: APRESOLINE Take 1 tablet (10 mg total) by mouth every 8 (eight) hours.   hydrocortisone 25 MG suppository Commonly known as: ANUSOL-HC Place 1 suppository (25 mg total) rectally 2 (two) times daily for 12 days.   Lasix 40 MG tablet Generic drug: furosemide Take 40 mg by mouth 2 (two) times daily.   loperamide 2 MG tablet Commonly known as: IMODIUM A-D Take 2 mg by mouth 4 (four) times daily as needed for diarrhea or loose stools.   nitroGLYCERIN 2 % ointment Commonly known as: NITROGLYN Apply 1 inch topically every 12 (twelve) hours. Apply 1 inch topically to rectum every 12 hours    ondansetron 4 MG tablet Commonly known as: ZOFRAN Take 1 tablet (4 mg total) by mouth 4 (four) times daily. What changed: additional instructions   pantoprazole 40 MG tablet Commonly known as: PROTONIX Take 1 tablet (40 mg total) by mouth daily before breakfast.   predniSONE 10 MG tablet Commonly known as: Deltasone Take 4 tablets (40 mg total) by mouth daily with breakfast for 14 days, THEN 3 tablets (30 mg total) daily with breakfast for 14 days, THEN 2 tablets (20 mg total) daily with breakfast for 26 days. Start taking on: September 18, 2019   vitamin B-12 1000 MCG tablet Commonly known as: CYANOCOBALAMIN Take 1,000 mcg by mouth daily.   vitamin C 500 MG tablet Commonly known as: ASCORBIC ACID Take 500 mg by mouth daily.   Vitamin D3 25 MCG (1000 UT) Caps Take 1 capsule by mouth daily.           Objective:   Physical Exam BP (!) 130/48 (BP Location: Right Leg, Patient Position: Sitting, Cuff Size: Small)   Pulse 65   Temp (!) 96.3 F (35.7 C) (Temporal)   Resp 18   Ht 5' 2"  (1.575 m)   SpO2 100%   BMI 25.60 kg/m    Exam limited, the patient is sitting in a chair and declined to take her jacket General:   Well developed, NAD, BMI noted. HEENT:  Normocephalic . Face symmetric, atraumatic Lungs:  CTA B Normal respiratory effort, no intercostal retractions, no accessory muscle use. Heart: RRR,  no murmur.  Soft pitting edema at both ankles. Left upper extremity: Dorsum of the hand is quite swollen.  Unable to see the rest of her arm but she denies redness or warmness Skin: Pale, no jaundice Neurologic:  alert & oriented X3.  Speech normal, gait not tested Psych--  Cognition and judgment appear intact.  Cooperative with normal attention span and concentration.  Behavior appropriate. No anxious or depressed appearing.      Assessment       Assessment  DM- neuropathy (paresthesias, nl pinprick exam), CKD HTN Hyperlipidemia: Lipitor intolerant? CAD?  States see Dr Terrence Dupont, no OV  records. 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 Due for labs however she is going to have her dialysis tomorrow at 11 AM, will defer them to check her hemoglobin. She looks pale however she was able to get up, get dressed and come to this office( with assistance),  she is weak as expected for the degree of anemia but not severe/acute symptoms. Kidney failure: On hemodialysis TTS. Edema: Chronic left upper extremity edema seems to be worse but no cellulitis.  She has periankle edema.  Worsened edema likely related to malnourishment, her last serum Albumin very decreased.  Recommend to start Pro-Stat supplements under the direction of the  hemodialysis nutritionist. HTN: Seems controlled today Anxiety, depression: Continue Lexapro General debility: Still quite weak, she is trying to get some help from the [place], she will let me know if she ever needs records or a letter. RTC 4 months   This visit occurred during the SARS-CoV-2 public health emergency.  Safety protocols were in place, including screening questions prior to the visit, additional usage of staff PPE, and extensive cleaning of exam room while observing appropriate contact time as indicated for disinfecting solutions.

## 2019-09-24 NOTE — Assessment & Plan Note (Signed)
Syncope due to symptomatic anemia: Status post admission, had a colonoscopy and EGD, on prednisone for Crohn's disease. Due for labs however she is going to have her dialysis tomorrow at 11 AM, will defer them to check her hemoglobin. She looks pale however she was able to get up, get dressed and come to this office( with assistance),  she is weak as expected for the degree of anemia but not severe/acute symptoms. Kidney failure: On hemodialysis TTS. Edema: Chronic left upper extremity edema seems to be worse but no cellulitis.  She has periankle edema.  Worsened edema likely related to malnourishment, her last serum Albumin very decreased.  Recommend to start Pro-Stat supplements under the direction of the  hemodialysis nutritionist. HTN: Seems controlled today Anxiety, depression: Continue Lexapro General debility: Still quite weak, she is trying to get some help from the [place], she will let me know if she ever needs records or a letter. RTC 4 months

## 2019-09-25 DIAGNOSIS — N186 End stage renal disease: Secondary | ICD-10-CM | POA: Diagnosis not present

## 2019-09-25 DIAGNOSIS — D509 Iron deficiency anemia, unspecified: Secondary | ICD-10-CM | POA: Diagnosis not present

## 2019-09-25 DIAGNOSIS — Z992 Dependence on renal dialysis: Secondary | ICD-10-CM | POA: Diagnosis not present

## 2019-09-25 DIAGNOSIS — E1129 Type 2 diabetes mellitus with other diabetic kidney complication: Secondary | ICD-10-CM | POA: Diagnosis not present

## 2019-09-25 DIAGNOSIS — N2581 Secondary hyperparathyroidism of renal origin: Secondary | ICD-10-CM | POA: Diagnosis not present

## 2019-09-25 DIAGNOSIS — D631 Anemia in chronic kidney disease: Secondary | ICD-10-CM | POA: Diagnosis not present

## 2019-09-25 NOTE — Progress Notes (Signed)
COMMUNITY PALLIATIVE CARE RN NOTE  PATIENT NAME: Samantha Mckenzie DOB: 04/22/1936 MRN: [id number]  PRIMARY CARE PROVIDER: Paz, Jose E, MD  RESPONSIBLE PARTY:  Acct ID - Guarantor Home Phone Work Phone Relationship Acct Type  [id number] - Trigueros,ROSAL* [phone]  Self P/F     [address], [place], [place] [address]   Covid-19 Pre-screening Negative  PLAN OF CARE and INTERVENTION:  1. ADVANCE CARE PLANNING/GOALS OF CARE: Goal is for patient to get stronger and be able to walk better. She has a DNR. 2. PATIENT/CAREGIVER EDUCATION: Symptom management, Medication management 3. DISEASE STATUS: Met with patient and her husband in their home. Upon arrival, patient is sitting up in her bed awake and alert. She denies pain but says that she is "not doing too good." She says that is having difficulties ambulating with her walker. She states that she can feel herself swaying from side to side and does not feel steady. Her husband is having to assist her with bathing and dressing d/t weakness. She is concerned about this because she says that he is "sick himself." She had a visit from a Physical Therapist through [facility] home health yesterday morning and is going to be starting PT/OT. She had a recent fall 2 days ago and husband called EMS to get her up. No apparent injuries. She spends most of her time lying in bed during the day. She continues with dialysis on Tues, Thurs, Sat. I was able to observe her ambulate with her walker from her room to the living room. She was able to perform this task ok, but had to stop a few times d/t fatigue. She wants to get stronger to be able to care for herself independently again. She says that she is eating about 1 meal/day. She denies any nausea and vomiting. She does experience diarrhea often, but Imodium is effective. She had a BM yesterday, but was unsure if any blood was present. There is edema noted to her left hand and arm along with bilateral legs and feet.  She is trying to keep both elevated during the day to help. I filled her pill box and will continue to do this weekly. She has a follow up appointment with her PCP on 09/24/19. Will continue to monitor.  HISTORY OF PRESENT ILLNESS: This is a 83 yo female who resides at home with her husband. She was recently hospitalized from 09/11/19 to 09/18/19 s/p fall at home where she hit her head. She was found to be anemic and received 2 Units of PRBCs and and iron infusion. Palliative care team continues to follow patient. Next visit scheduled in 1 week.   CODE STATUS: DNR ADVANCED DIRECTIVES: Y MOST FORM: yes PPS: 40%   PHYSICAL EXAM:   LUNGS: clear to auscultation  CARDIAC: Cor RRR EXTREMITIES: Edema in left arm and bilateral feet/legs SKIN: Ashen appearance; no wounds  NEURO: Alert and oriented x 2, increased generalized weakness, ambulatory with walker   (Duration of visit and documentation 75 minutes)    , RN BSN 

## 2019-09-26 ENCOUNTER — Telehealth: Payer: Self-pay | Admitting: Internal Medicine

## 2019-09-26 DIAGNOSIS — R55 Syncope and collapse: Secondary | ICD-10-CM | POA: Diagnosis not present

## 2019-09-26 DIAGNOSIS — K50011 Crohn's disease of small intestine with rectal bleeding: Secondary | ICD-10-CM | POA: Diagnosis not present

## 2019-09-26 DIAGNOSIS — D631 Anemia in chronic kidney disease: Secondary | ICD-10-CM | POA: Diagnosis not present

## 2019-09-26 DIAGNOSIS — D62 Acute posthemorrhagic anemia: Secondary | ICD-10-CM | POA: Diagnosis not present

## 2019-09-26 DIAGNOSIS — N186 End stage renal disease: Secondary | ICD-10-CM | POA: Diagnosis not present

## 2019-09-26 DIAGNOSIS — I120 Hypertensive chronic kidney disease with stage 5 chronic kidney disease or end stage renal disease: Secondary | ICD-10-CM | POA: Diagnosis not present

## 2019-09-26 NOTE — Telephone Encounter (Signed)
Called patient, no answer. Called her son  [name], he reports that Samantha Mckenzie is doing okay emotionally. We agreed that he will keep an eye on things and let me know if something changes.  We could add a  medication (low-dose Wellbutrin)

## 2019-09-26 NOTE — Telephone Encounter (Signed)
Samantha Mckenzie, OT [facility]  Pt is reportedly more depressed/emotional. Possible medication adjustment needed Best contact: [phone]

## 2019-09-27 DIAGNOSIS — Z992 Dependence on renal dialysis: Secondary | ICD-10-CM | POA: Diagnosis not present

## 2019-09-27 DIAGNOSIS — N186 End stage renal disease: Secondary | ICD-10-CM | POA: Diagnosis not present

## 2019-09-27 DIAGNOSIS — N2581 Secondary hyperparathyroidism of renal origin: Secondary | ICD-10-CM | POA: Diagnosis not present

## 2019-09-27 DIAGNOSIS — D631 Anemia in chronic kidney disease: Secondary | ICD-10-CM | POA: Diagnosis not present

## 2019-09-27 DIAGNOSIS — E1129 Type 2 diabetes mellitus with other diabetic kidney complication: Secondary | ICD-10-CM | POA: Diagnosis not present

## 2019-09-27 DIAGNOSIS — D509 Iron deficiency anemia, unspecified: Secondary | ICD-10-CM | POA: Diagnosis not present

## 2019-09-28 ENCOUNTER — Other Ambulatory Visit: Payer: Self-pay

## 2019-09-28 ENCOUNTER — Other Ambulatory Visit: Payer: Medicare Other | Admitting: *Deleted

## 2019-09-28 DIAGNOSIS — M1030 Gout due to renal impairment, unspecified site: Secondary | ICD-10-CM

## 2019-09-28 DIAGNOSIS — R55 Syncope and collapse: Secondary | ICD-10-CM

## 2019-09-28 DIAGNOSIS — D631 Anemia in chronic kidney disease: Secondary | ICD-10-CM

## 2019-09-28 DIAGNOSIS — N186 End stage renal disease: Secondary | ICD-10-CM

## 2019-09-28 DIAGNOSIS — I972 Postmastectomy lymphedema syndrome: Secondary | ICD-10-CM

## 2019-09-28 DIAGNOSIS — Z9012 Acquired absence of left breast and nipple: Secondary | ICD-10-CM

## 2019-09-28 DIAGNOSIS — Z515 Encounter for palliative care: Secondary | ICD-10-CM

## 2019-09-28 DIAGNOSIS — E440 Moderate protein-calorie malnutrition: Secondary | ICD-10-CM

## 2019-09-28 DIAGNOSIS — I120 Hypertensive chronic kidney disease with stage 5 chronic kidney disease or end stage renal disease: Secondary | ICD-10-CM

## 2019-09-28 DIAGNOSIS — D62 Acute posthemorrhagic anemia: Secondary | ICD-10-CM

## 2019-09-28 DIAGNOSIS — F329 Major depressive disorder, single episode, unspecified: Secondary | ICD-10-CM

## 2019-09-28 DIAGNOSIS — K50011 Crohn's disease of small intestine with rectal bleeding: Secondary | ICD-10-CM

## 2019-09-28 DIAGNOSIS — Z853 Personal history of malignant neoplasm of breast: Secondary | ICD-10-CM

## 2019-09-28 DIAGNOSIS — E876 Hypokalemia: Secondary | ICD-10-CM

## 2019-09-28 NOTE — Progress Notes (Signed)
COMMUNITY PALLIATIVE CARE RN NOTE  PATIENT NAME: Samantha Mckenzie DOB: 05-06-1936 MRN: [id number]  PRIMARY CARE PROVIDER: Colon Branch, MD  RESPONSIBLE PARTY:  Acct ID - Guarantor Home Phone Work Phone Relationship Acct Type  [id number] CRICKETT, [phone]  Self P/F     [address] [place], [place] [address]   Covid-19 Pre-screening Negative  PLAN OF CARE and INTERVENTION:  1. ADVANCE CARE PLANNING/GOALS OF CARE: Goal is for patient to get stronger and feel better. She has a DNR. 2. PATIENT/CAREGIVER EDUCATION: Safe Mobility/Transfers, Medication Management 3. DISEASE STATUS: Met with patient and her husband in their home. Upon arrival, patient is sitting up in her recliner in the living room awake and alert. She denies pain. She is able to answer simple questions appropriately, but becomes confused easily when given instructions. She reports continued weakness and fatigue. She is able to ambulate with her walker, but feels dizzy and unsteady at times and requires frequent rest periods. She also needs assistance from her husband with standing from the commode and with dressing. She had a elevated toilet seat/bedside commode delivered today to help with standing after toileting. She has started with PT/OT through [facility]. She hopes to get stronger. Her intake is deteriorating, but she does try to eat something. She does not have much of an appetite. She continues on Marinol twice daily to help stimulate her appetite. She denies dysphagia, nausea or vomiting. She does have intermittent diarrhea, but Imodium is effective in controlling this. She continues with blood in her stool. She is on a Prednisone taper for her Crohn's disease. She also continues with swelling in her left hand/arm and bilateral feet/ankles. She says she does try to keep them elevated during the day. She is on dialysis on Tues, Thurs, Sat. New orders received from the dialysis center to hold am blood pressure  medications prior to dialysis, but to take them when she gets home after dialysis. It is ok for her to take her other am medications prior to dialysis. Filled pill box for patient. Will continue to monitor.  HISTORY OF PRESENT ILLNESS:  This is a 83 yo female who resides at home with her husband. Palliative care team continues to follow patient. Next visit scheduled 10/08/19.  CODE STATUS: DNR ADVANCED DIRECTIVES: Y MOST FORM: yes PPS: 40%   PHYSICAL EXAM:   LUNGS: clear to auscultation  CARDIAC: Cor RRR EXTREMITIES: pitting edema in left hand/arm, bilateral feet/ankles SKIN: Pale in appearance;Exposed skin is dry and intact  NEURO: Alert and oriented to person/place, forgetful, some confusion, ambulatory with walker   (Duration of visit and documentation 75 minutes)   Daryl Eastern, RN BSN

## 2019-09-29 DIAGNOSIS — Z992 Dependence on renal dialysis: Secondary | ICD-10-CM | POA: Diagnosis not present

## 2019-09-29 DIAGNOSIS — D509 Iron deficiency anemia, unspecified: Secondary | ICD-10-CM | POA: Diagnosis not present

## 2019-09-29 DIAGNOSIS — D631 Anemia in chronic kidney disease: Secondary | ICD-10-CM | POA: Diagnosis not present

## 2019-09-29 DIAGNOSIS — N2581 Secondary hyperparathyroidism of renal origin: Secondary | ICD-10-CM | POA: Diagnosis not present

## 2019-09-29 DIAGNOSIS — E1129 Type 2 diabetes mellitus with other diabetic kidney complication: Secondary | ICD-10-CM | POA: Diagnosis not present

## 2019-09-29 DIAGNOSIS — N186 End stage renal disease: Secondary | ICD-10-CM | POA: Diagnosis not present

## 2019-10-01 ENCOUNTER — Other Ambulatory Visit: Payer: Self-pay | Admitting: *Deleted

## 2019-10-01 ENCOUNTER — Encounter: Payer: Self-pay | Admitting: *Deleted

## 2019-10-01 DIAGNOSIS — I120 Hypertensive chronic kidney disease with stage 5 chronic kidney disease or end stage renal disease: Secondary | ICD-10-CM | POA: Diagnosis not present

## 2019-10-01 DIAGNOSIS — D62 Acute posthemorrhagic anemia: Secondary | ICD-10-CM | POA: Diagnosis not present

## 2019-10-01 DIAGNOSIS — K50011 Crohn's disease of small intestine with rectal bleeding: Secondary | ICD-10-CM | POA: Diagnosis not present

## 2019-10-01 DIAGNOSIS — R55 Syncope and collapse: Secondary | ICD-10-CM | POA: Diagnosis not present

## 2019-10-01 DIAGNOSIS — D631 Anemia in chronic kidney disease: Secondary | ICD-10-CM | POA: Diagnosis not present

## 2019-10-01 DIAGNOSIS — N186 End stage renal disease: Secondary | ICD-10-CM | POA: Diagnosis not present

## 2019-10-01 NOTE — Patient Outreach (Signed)
[facility]) Care Management  10/01/2019  Samantha Mckenzie 1936-01-04 [id number]   Call placed to member to follow up on management of chronic conditions, she report she is still not feeling well.  State she remains dizzy and seeing dark blood every time she has a bowel movement.  Report having 1-2 BM's daily.  Remains active with HD on TTS, also has [facility] and [facility] in the home several times a week.  She has been having PT and OT with [facility] and is active with palliative care with [facility], nurse visits weekly and fills pill box.  She denies pain at this time, advised to check blood pressure during conversation. Report blood pressure from her leg (unable to use both arms) was 145/53, HR 67.  Noted she has appointment with GI on 12/30.  Call was placed to office to report member's symptoms, however they are unable to move her appointment up.  She is advised to go to the ED if symptoms get any worse.  Member verbalizes understanding.  She has visit later today with PT, denies any urgent concerns at this time.  Will follow up with member within the next 2 weeks, will contact son at [facility] request to remind him of GI appointment.  THN CM Care Plan Problem One     Most Recent Value  Care Plan Problem One  Risk for fall related to anemia as evidenced by recent hospitalization for anemia and falls  Role Documenting the Problem One  Care Management [place] for Problem One  Active  THN Long Term Goal   Member will not be readmitted to hospital within the next 31 days  THN Long Term Goal Start Date  09/21/19  Interventions for Problem One Long Term Goal  Reviewed signs/symptoms of anemia, discussed when to seek medical attention with symptoms  THN CM Short Term Goal #1   Member will report follow up appointments with primary MD and GI within the next 3 weeks  THN CM Short Term Goal #1 Start Date  09/21/19  Interventions for Short Term Goal #1  Call placed to  GI office to request sooner appointment, will collaborate with son to remind him of next GI appointment. PCP appointment complete  THN CM Short Term Goal #2   Member will report having a plan to increase support in the home within the next 4 weeks  THN CM Short Term Goal #2 Start Date  09/21/19  Interventions for Short Term Goal #2  Collaborate with [facility] social worker and Neurosurgeon to develop plan to increase support in the home     [place], Therapist, sports, MSN [place] Manager [phone]

## 2019-10-02 ENCOUNTER — Inpatient Hospital Stay (HOSPITAL_COMMUNITY)
Admission: EM | Admit: 2019-10-02 | Discharge: 2019-10-04 | DRG: 385 | Disposition: A | Discharge status: Home / Self Care | Payer: Medicare Other | Attending: Internal Medicine | Admitting: Internal Medicine

## 2019-10-02 ENCOUNTER — Emergency Department (HOSPITAL_COMMUNITY): Payer: Medicare Other

## 2019-10-02 ENCOUNTER — Other Ambulatory Visit: Payer: Self-pay

## 2019-10-02 ENCOUNTER — Encounter (HOSPITAL_COMMUNITY): Payer: Self-pay | Admitting: Emergency Medicine

## 2019-10-02 DIAGNOSIS — N2581 Secondary hyperparathyroidism of renal origin: Secondary | ICD-10-CM | POA: Diagnosis present

## 2019-10-02 DIAGNOSIS — E8889 Other specified metabolic disorders: Secondary | ICD-10-CM | POA: Diagnosis present

## 2019-10-02 DIAGNOSIS — D649 Anemia, unspecified: Secondary | ICD-10-CM | POA: Diagnosis not present

## 2019-10-02 DIAGNOSIS — H3530 Unspecified macular degeneration: Secondary | ICD-10-CM | POA: Diagnosis present

## 2019-10-02 DIAGNOSIS — I2510 Atherosclerotic heart disease of native coronary artery without angina pectoris: Secondary | ICD-10-CM | POA: Diagnosis present

## 2019-10-02 DIAGNOSIS — Z79899 Other long term (current) drug therapy: Secondary | ICD-10-CM

## 2019-10-02 DIAGNOSIS — I132 Hypertensive heart and chronic kidney disease with heart failure and with stage 5 chronic kidney disease, or end stage renal disease: Secondary | ICD-10-CM | POA: Diagnosis present

## 2019-10-02 DIAGNOSIS — F329 Major depressive disorder, single episode, unspecified: Secondary | ICD-10-CM | POA: Diagnosis present

## 2019-10-02 DIAGNOSIS — I4891 Unspecified atrial fibrillation: Secondary | ICD-10-CM | POA: Insufficient documentation

## 2019-10-02 DIAGNOSIS — E876 Hypokalemia: Secondary | ICD-10-CM | POA: Diagnosis not present

## 2019-10-02 DIAGNOSIS — K219 Gastro-esophageal reflux disease without esophagitis: Secondary | ICD-10-CM | POA: Diagnosis present

## 2019-10-02 DIAGNOSIS — I120 Hypertensive chronic kidney disease with stage 5 chronic kidney disease or end stage renal disease: Secondary | ICD-10-CM | POA: Diagnosis not present

## 2019-10-02 DIAGNOSIS — M109 Gout, unspecified: Secondary | ICD-10-CM | POA: Diagnosis present

## 2019-10-02 DIAGNOSIS — Z20828 Contact with and (suspected) exposure to other viral communicable diseases: Secondary | ICD-10-CM | POA: Diagnosis present

## 2019-10-02 DIAGNOSIS — R627 Adult failure to thrive: Secondary | ICD-10-CM | POA: Diagnosis present

## 2019-10-02 DIAGNOSIS — Z853 Personal history of malignant neoplasm of breast: Secondary | ICD-10-CM

## 2019-10-02 DIAGNOSIS — Z7952 Long term (current) use of systemic steroids: Secondary | ICD-10-CM

## 2019-10-02 DIAGNOSIS — E1136 Type 2 diabetes mellitus with diabetic cataract: Secondary | ICD-10-CM | POA: Diagnosis present

## 2019-10-02 DIAGNOSIS — Z8249 Family history of ischemic heart disease and other diseases of the circulatory system: Secondary | ICD-10-CM

## 2019-10-02 DIAGNOSIS — F419 Anxiety disorder, unspecified: Secondary | ICD-10-CM | POA: Diagnosis present

## 2019-10-02 DIAGNOSIS — E440 Moderate protein-calorie malnutrition: Secondary | ICD-10-CM | POA: Diagnosis not present

## 2019-10-02 DIAGNOSIS — D631 Anemia in chronic kidney disease: Secondary | ICD-10-CM | POA: Diagnosis present

## 2019-10-02 DIAGNOSIS — D62 Acute posthemorrhagic anemia: Secondary | ICD-10-CM | POA: Diagnosis present

## 2019-10-02 DIAGNOSIS — I480 Paroxysmal atrial fibrillation: Secondary | ICD-10-CM | POA: Diagnosis present

## 2019-10-02 DIAGNOSIS — D539 Nutritional anemia, unspecified: Secondary | ICD-10-CM | POA: Diagnosis present

## 2019-10-02 DIAGNOSIS — N186 End stage renal disease: Secondary | ICD-10-CM | POA: Diagnosis present

## 2019-10-02 DIAGNOSIS — E46 Unspecified protein-calorie malnutrition: Secondary | ICD-10-CM | POA: Diagnosis present

## 2019-10-02 DIAGNOSIS — E8779 Other fluid overload: Secondary | ICD-10-CM | POA: Diagnosis not present

## 2019-10-02 DIAGNOSIS — E1122 Type 2 diabetes mellitus with diabetic chronic kidney disease: Secondary | ICD-10-CM | POA: Diagnosis present

## 2019-10-02 DIAGNOSIS — K5730 Diverticulosis of large intestine without perforation or abscess without bleeding: Secondary | ICD-10-CM | POA: Diagnosis present

## 2019-10-02 DIAGNOSIS — R630 Anorexia: Secondary | ICD-10-CM | POA: Diagnosis present

## 2019-10-02 DIAGNOSIS — D696 Thrombocytopenia, unspecified: Secondary | ICD-10-CM | POA: Diagnosis present

## 2019-10-02 DIAGNOSIS — K922 Gastrointestinal hemorrhage, unspecified: Secondary | ICD-10-CM | POA: Diagnosis not present

## 2019-10-02 DIAGNOSIS — J449 Chronic obstructive pulmonary disease, unspecified: Secondary | ICD-10-CM | POA: Diagnosis present

## 2019-10-02 DIAGNOSIS — Z66 Do not resuscitate: Secondary | ICD-10-CM | POA: Diagnosis present

## 2019-10-02 DIAGNOSIS — I972 Postmastectomy lymphedema syndrome: Secondary | ICD-10-CM | POA: Diagnosis not present

## 2019-10-02 DIAGNOSIS — M1990 Unspecified osteoarthritis, unspecified site: Secondary | ICD-10-CM | POA: Diagnosis present

## 2019-10-02 DIAGNOSIS — Z992 Dependence on renal dialysis: Secondary | ICD-10-CM

## 2019-10-02 DIAGNOSIS — Z9012 Acquired absence of left breast and nipple: Secondary | ICD-10-CM

## 2019-10-02 DIAGNOSIS — E785 Hyperlipidemia, unspecified: Secondary | ICD-10-CM | POA: Diagnosis present

## 2019-10-02 DIAGNOSIS — I5032 Chronic diastolic (congestive) heart failure: Secondary | ICD-10-CM | POA: Diagnosis present

## 2019-10-02 DIAGNOSIS — K50011 Crohn's disease of small intestine with rectal bleeding: Principal | ICD-10-CM | POA: Diagnosis present

## 2019-10-02 DIAGNOSIS — D500 Iron deficiency anemia secondary to blood loss (chronic): Secondary | ICD-10-CM | POA: Diagnosis present

## 2019-10-02 DIAGNOSIS — R55 Syncope and collapse: Secondary | ICD-10-CM | POA: Diagnosis not present

## 2019-10-02 DIAGNOSIS — E1140 Type 2 diabetes mellitus with diabetic neuropathy, unspecified: Secondary | ICD-10-CM | POA: Diagnosis present

## 2019-10-02 DIAGNOSIS — R0602 Shortness of breath: Secondary | ICD-10-CM | POA: Diagnosis not present

## 2019-10-02 DIAGNOSIS — Z515 Encounter for palliative care: Secondary | ICD-10-CM | POA: Diagnosis present

## 2019-10-02 DIAGNOSIS — M1030 Gout due to renal impairment, unspecified site: Secondary | ICD-10-CM | POA: Diagnosis not present

## 2019-10-02 LAB — BASIC METABOLIC PANEL
Anion gap: 13 (ref 5–15)
BUN: 56 mg/dL — ABNORMAL HIGH (ref 8–23)
CO2: 27 mmol/L (ref 22–32)
Calcium: 7.2 mg/dL — ABNORMAL LOW (ref 8.9–10.3)
Chloride: 99 mmol/L (ref 98–111)
Creatinine, Ser: 6.08 mg/dL — ABNORMAL HIGH (ref 0.44–1.00)
GFR calc Af Amer: 7 mL/min — ABNORMAL LOW (ref >60)
GFR calc non Af Amer: 6 mL/min — ABNORMAL LOW (ref >60)
Glucose, Bld: 107 mg/dL — ABNORMAL HIGH (ref 70–99)
Potassium: 3.5 mmol/L (ref 3.5–5.1)
Sodium: 139 mmol/L (ref 135–145)

## 2019-10-02 LAB — POC OCCULT BLOOD, ED: Fecal Occult Bld: POSITIVE — AB

## 2019-10-02 LAB — CBC
HCT: 23.9 % — ABNORMAL LOW (ref 36.0–46.0)
Hemoglobin: 7.1 g/dL — ABNORMAL LOW (ref 12.0–15.0)
MCH: 32.3 pg (ref 26.0–34.0)
MCHC: 29.7 g/dL — ABNORMAL LOW (ref 30.0–36.0)
MCV: 108.6 fL — ABNORMAL HIGH (ref 80.0–100.0)
Platelets: 165 10*3/uL (ref 150–400)
RBC: 2.2 MIL/uL — ABNORMAL LOW (ref 3.87–5.11)
RDW: 19.8 % — ABNORMAL HIGH (ref 11.5–15.5)
WBC: 10.5 10*3/uL (ref 4.0–10.5)
nRBC: 0 % (ref 0.0–0.2)

## 2019-10-02 LAB — CBG MONITORING, ED: Glucose-Capillary: 104 mg/dL — ABNORMAL HIGH (ref 70–99)

## 2019-10-02 LAB — HEMOGLOBIN AND HEMATOCRIT, BLOOD
HCT: 28.3 % — ABNORMAL LOW (ref 36.0–46.0)
Hemoglobin: 9.3 g/dL — ABNORMAL LOW (ref 12.0–15.0)

## 2019-10-02 LAB — PREPARE RBC (CROSSMATCH)

## 2019-10-02 LAB — SARS CORONAVIRUS 2 (TAT 6-24 HRS): SARS Coronavirus 2: NEGATIVE

## 2019-10-02 MED ORDER — DILTIAZEM HCL-DEXTROSE 125-5 MG/125ML-% IV SOLN (PREMIX)
5.0000 mg/h | INTRAVENOUS | Status: DC
  Filled 2019-10-02: qty 125

## 2019-10-02 MED ORDER — ESCITALOPRAM OXALATE 20 MG PO TABS
20.0000 mg | ORAL_TABLET | Freq: Every day | ORAL | Status: DC
  Administered 2019-10-03 – 2019-10-04 (×2): 20 mg via ORAL
  Filled 2019-10-02 (×2): qty 1

## 2019-10-02 MED ORDER — LOPERAMIDE HCL 2 MG PO CAPS
2.0000 mg | ORAL_CAPSULE | Freq: Four times a day (QID) | ORAL | Status: DC | PRN
  Administered 2019-10-02 – 2019-10-04 (×2): 2 mg via ORAL
  Filled 2019-10-02 (×2): qty 1

## 2019-10-02 MED ORDER — CHLORHEXIDINE GLUCONATE CLOTH 2 % EX PADS
6.0000 | MEDICATED_PAD | Freq: Every day | CUTANEOUS | Status: DC
  Administered 2019-10-03 – 2019-10-04 (×2): 6 via TOPICAL

## 2019-10-02 MED ORDER — PREDNISONE 20 MG PO TABS: 30.0000 mg | ORAL_TABLET | Freq: Every day | ORAL | Status: DC

## 2019-10-02 MED ORDER — SODIUM CHLORIDE 0.9% FLUSH
3.0000 mL | Freq: Once | INTRAVENOUS | Status: AC
  Administered 2019-10-02: 3 mL via INTRAVENOUS

## 2019-10-02 MED ORDER — ACETAMINOPHEN 325 MG PO TABS: 650.0000 mg | ORAL_TABLET | Freq: Four times a day (QID) | ORAL | Status: DC | PRN

## 2019-10-02 MED ORDER — PROMETHAZINE HCL 25 MG PO TABS: 12.5000 mg | ORAL_TABLET | Freq: Four times a day (QID) | ORAL | Status: DC | PRN

## 2019-10-02 MED ORDER — CARVEDILOL 6.25 MG PO TABS
6.2500 mg | ORAL_TABLET | Freq: Two times a day (BID) | ORAL | Status: DC
  Administered 2019-10-03: 6.25 mg via ORAL
  Filled 2019-10-02: qty 1

## 2019-10-02 MED ORDER — ATORVASTATIN CALCIUM 10 MG PO TABS
20.0000 mg | ORAL_TABLET | Freq: Every day | ORAL | Status: DC
  Administered 2019-10-03 – 2019-10-04 (×2): 20 mg via ORAL
  Filled 2019-10-02 (×2): qty 2

## 2019-10-02 MED ORDER — DRONABINOL 2.5 MG PO CAPS
2.5000 mg | ORAL_CAPSULE | Freq: Two times a day (BID) | ORAL | Status: DC
  Administered 2019-10-03 – 2019-10-04 (×2): 2.5 mg via ORAL
  Filled 2019-10-02 (×2): qty 1

## 2019-10-02 MED ORDER — DICYCLOMINE HCL 10 MG PO CAPS
20.0000 mg | ORAL_CAPSULE | Freq: Two times a day (BID) | ORAL | Status: DC
  Administered 2019-10-02 – 2019-10-04 (×4): 20 mg via ORAL
  Filled 2019-10-02 (×4): qty 2

## 2019-10-02 MED ORDER — HYDROCORTISONE ACETATE 25 MG RE SUPP
25.0000 mg | Freq: Two times a day (BID) | RECTAL | Status: DC
  Administered 2019-10-02: 25 mg via RECTAL
  Filled 2019-10-02 (×4): qty 1

## 2019-10-02 MED ORDER — METOPROLOL TARTRATE 5 MG/5ML IV SOLN
2.5000 mg | Freq: Once | INTRAVENOUS | Status: AC
  Administered 2019-10-02: 2.5 mg via INTRAVENOUS
  Filled 2019-10-02: qty 5

## 2019-10-02 MED ORDER — PANTOPRAZOLE SODIUM 40 MG PO TBEC
40.0000 mg | DELAYED_RELEASE_TABLET | Freq: Every day | ORAL | Status: DC
  Administered 2019-10-03 – 2019-10-04 (×2): 40 mg via ORAL
  Filled 2019-10-02 (×2): qty 1

## 2019-10-02 MED ORDER — SODIUM CHLORIDE 0.9% IV SOLUTION: Freq: Once | INTRAVENOUS | Status: DC

## 2019-10-02 MED ORDER — NITROGLYCERIN 2 % TD OINT
1.0000 [in_us] | TOPICAL_OINTMENT | Freq: Two times a day (BID) | TRANSDERMAL | Status: DC
  Administered 2019-10-02 – 2019-10-03 (×2): 1 [in_us] via TOPICAL
  Filled 2019-10-02: qty 30

## 2019-10-02 MED ORDER — DILTIAZEM LOAD VIA INFUSION
20.0000 mg | Freq: Once | INTRAVENOUS | Status: DC
  Filled 2019-10-02: qty 20

## 2019-10-02 MED ORDER — PREDNISONE 20 MG PO TABS
20.0000 mg | ORAL_TABLET | Freq: Every day | ORAL | Status: DC
  Administered 2019-10-03 – 2019-10-04 (×2): 20 mg via ORAL
  Filled 2019-10-02 (×2): qty 1

## 2019-10-02 MED ORDER — ALLOPURINOL 100 MG PO TABS
100.0000 mg | ORAL_TABLET | Freq: Every day | ORAL | Status: DC
  Administered 2019-10-03 – 2019-10-04 (×2): 100 mg via ORAL
  Filled 2019-10-02 (×2): qty 1

## 2019-10-02 MED ORDER — DARBEPOETIN ALFA 200 MCG/0.4ML IJ SOSY
200.0000 ug | PREFILLED_SYRINGE | INTRAMUSCULAR | Status: DC
  Filled 2019-10-02: qty 0.4

## 2019-10-02 MED ORDER — FUROSEMIDE 40 MG PO TABS
40.0000 mg | ORAL_TABLET | Freq: Two times a day (BID) | ORAL | Status: DC
  Administered 2019-10-02 – 2019-10-04 (×4): 40 mg via ORAL
  Filled 2019-10-02 (×4): qty 1

## 2019-10-02 NOTE — ED Provider Notes (Signed)
[place] EMERGENCY DEPARTMENT Provider Note   CSN: [id number] Arrival date & time: 10/02/19  1039     History Chief Complaint  Patient presents with  . Shortness of Breath  . Weakness    Samantha Mckenzie is a 83 y.o. female.  82 yo F with a chief complaints of feeling weak.  This been going on for quite some time but worsening since last night.  She has had some dark stool.  Denies abdominal pain denies vomiting or diarrhea.  She denies cough chest pain or shortness of breath.  Denies fevers.  Denies decreased oral intake.  Just felt too weak to get up and move around.  Has dialysis scheduled this morning for 11 but did not think she could make it.  Ended up calling 911.  The history is provided by the patient.  Shortness of Breath Severity:  Mild Onset quality:  Sudden Duration:  1 day Timing:  Constant Progression:  Worsening Chronicity:  New Relieved by:  Nothing Worsened by:  Nothing Ineffective treatments:  None tried Associated symptoms: no abdominal pain, no chest pain, no fever, no headaches, no vomiting and no wheezing   Weakness Associated symptoms: shortness of breath   Associated symptoms: no abdominal pain, no arthralgias, no chest pain, no dizziness, no dysuria, no fever, no headaches, no myalgias, no nausea, no urgency and no vomiting        Past Medical History:  Diagnosis Date  . Allergic rhinitis   . Anemia   . Breast CA ([place])    surgery, chemo, XRT; had peripheral neuropathy (imbalance at times) after chemo  . Cellulitis LEFT arm recurrent 12/08/2011  . Chronic renal disease, stage IV (HCC)    Dr. Moshe Cipro  . Depression   . Fatigue   . GERD (gastroesophageal reflux disease)    gastritis, EGD 02/2007  . Gout   . Hyperlipidemia   . Hypertension   . Macular degeneration 2011  . Normal cardiac stress test 12/2014  . Osteoarthritis   . Renal insufficiency   . Syncope 09/12/2019    Patient Active Problem List   Diagnosis  Date Noted  . Syncope 09/15/2019  . Rectal bleeding   . Malnutrition of moderate degree 09/14/2019  . DNR (do not resuscitate) 09/13/2019  . Symptomatic anemia 09/11/2019  . ESRD (end stage renal disease) on dialysis ([place])   . GI bleed   . Cellulitis of arm, left 08/02/2019  . Acidosis   . Dementia ([place]) 06/17/2019  . Abnormal CT scan, small bowel   . Dehydration 05/31/2019  . Acute blood loss anemia   . Ileitis 05/30/2019  . Terminal ileitis with complication ([place]) 01/77/9390  . COPD (chronic obstructive pulmonary disease) ([place]) 04/12/2019  . Crohn's disease of small intestine with rectal bleeding ([place]) 04/07/2019  . Chronic diastolic CHF (congestive heart failure) ([place]) 04/07/2019  . Hypokalemia 04/07/2019  . Hypocalcemia 04/07/2019  . Fall 04/07/2019  . Occult blood positive stool 04/07/2019  . Adjustment disorder with depressed mood   . Generalized abdominal pain   . Dysphasia 03/21/2019  . CAP (community acquired pneumonia) 03/20/2019  . Nausea vomiting and diarrhea 03/20/2019  . Acute respiratory failure with hypoxia ([place]) 03/20/2019  . Acute renal failure superimposed on stage 4 chronic kidney disease ([place]) 03/20/2019  . Lactic acidosis 03/20/2019  . Diabetic peripheral neuropathy associated with type 2 diabetes mellitus ([place]) 03/20/2019  . Hypomagnesemia 06/21/2018  . Generalized weakness 06/20/2018  . PCP NOTES >>> 07/11/2015  .  Sepsis ([place]) 04/19/2013  . Depression   . VITAMIN B12 DEFICIENCY 04/01/2008  . GERD (gastroesophageal reflux disease) 12/19/2007  . NEOP, MALIGNANT, FEMALE BREAST NOS 07/07/2007  . Osteoarthritis 07/07/2007  . Diabetes mellitus type 2 with complications ([place]) 76/80/8811  . HLD (hyperlipidemia) 02/28/2007  . Gout 02/28/2007  . Macrocytic anemia 02/28/2007  . Essential hypertension 02/28/2007    Past Surgical History:  Procedure Laterality Date  . AV FISTULA PLACEMENT Right 08/10/2019   Procedure: ARTERIOVENOUS (AV) FISTULA CREATION  VERSUS GRAFT PLACEMENT;  Surgeon: Angelia Mould, MD;  Location: [facility];  Service: Vascular;  Laterality: Right;  . BIOPSY  06/06/2019   Procedure: BIOPSY;  Surgeon: Ladene Artist, MD;  Location: [facility];  Service: Endoscopy;;  . BIOPSY  09/17/2019   Procedure: BIOPSY;  Surgeon: Irving Copas., MD;  Location: [facility] ENDOSCOPY;  Service: Gastroenterology;;  . cataracts bilaterally  1 -2012  . COLONOSCOPY WITH PROPOFOL N/A 06/06/2019   Procedure: COLONOSCOPY WITH PROPOFOL;  Surgeon: Ladene Artist, MD;  Location: [facility] ENDOSCOPY;  Service: Endoscopy;  Laterality: N/A;  . COLONOSCOPY WITH PROPOFOL N/A 09/17/2019   Procedure: COLONOSCOPY WITH PROPOFOL;  Surgeon: Rush Landmark Telford Nab., MD;  Location: [place];  Service: Gastroenterology;  Laterality: N/A;  . ESOPHAGOGASTRODUODENOSCOPY (EGD) WITH PROPOFOL N/A 09/17/2019   Procedure: ESOPHAGOGASTRODUODENOSCOPY (EGD) WITH PROPOFOL;  Surgeon: Rush Landmark Telford Nab., MD;  Location: [place];  Service: Gastroenterology;  Laterality: N/A;  . FLEXIBLE SIGMOIDOSCOPY N/A 09/15/2019   Procedure: FLEXIBLE SIGMOIDOSCOPY;  Surgeon: Yetta Flock, MD;  Location: [facility];  Service: Gastroenterology;  Laterality: N/A;  . IR FLUORO GUIDE CV LINE RIGHT  08/03/2019  . IR US GUIDE VASC ACCESS RIGHT  08/03/2019  . LUNG BIOPSY  1999   neg  . MASTECTOMY Left 1999   and lymphnodes   . POLYPECTOMY  09/17/2019   Procedure: POLYPECTOMY;  Surgeon: Mansouraty, Telford Nab., MD;  Location: [facility] ENDOSCOPY;  Service: Gastroenterology;;     OB History   No obstetric history on file.     Family History  Problem Relation Age of Onset  . Breast cancer Other        ? of   . Heart attack Father 13  . Diabetes Sister        2  . Hypertension Sister   . Colon cancer Neg Hx   . Stomach cancer Neg Hx     Social History   Tobacco Use  . Smoking status: Never Smoker  . Smokeless tobacco: Never Used  Substance Use Topics  . Alcohol use:  Not Currently    Alcohol/week: 0.0 standard drinks    Comment: rarely  . Drug use: No    Home Medications Prior to Admission medications   Medication Sig Start Date End Date Taking? Authorizing Provider  acetaminophen (TYLENOL) 325 MG tablet Take 2 tablets (650 mg total) by mouth every 6 (six) hours as needed for mild pain, fever or headache (or Fever >/= 101). 04/12/19  Yes Emokpae, Courage, MD  allopurinol (ZYLOPRIM) 100 MG tablet Take 1 tablet (100 mg total) by mouth daily. 06/29/19  Yes Lassen, Arlo C, PA-C  atorvastatin (LIPITOR) 20 MG tablet Take 1 tablet (20 mg total) by mouth daily. 06/29/19  Yes Lassen, Arlo C, PA-C  Calcium Carb-Cholecalciferol (CALTRATE 600+D3 PO) Take 1 tablet by mouth daily.    Yes [provider]  carvedilol (COREG) 6.25 MG tablet Take 1 tablet (6.25 mg total) by mouth 2 (two) times daily with a meal. 09/10/19  10/10/19 Yes Paz, Alda Berthold, MD  Cholecalciferol (VITAMIN D3) 1000 UNITS CAPS Take 1 capsule by mouth daily.    Yes [provider]  Darbepoetin Alfa (ARANESP) 100 MCG/0.5ML SOSY injection Inject 0.5 mLs (100 mcg total) into the vein every Saturday with hemodialysis. 08/11/19  Yes Kyle, Tyrone A, DO  dicyclomine (BENTYL) 10 MG capsule Take 2 capsules (20 mg total) by mouth 4 (four) times daily -  before meals and at bedtime. Patient taking differently: Take 20 mg by mouth 4 (four) times daily -  before meals and at bedtime. Taking twice daily 06/29/19  Yes Oscar La, Arlo C, PA-C  dronabinol (MARINOL) 2.5 MG capsule Take 1 capsule (2.5 mg total) by mouth 2 (two) times daily before a meal. 09/10/19  Yes Paz, Jacqulyn Bath E, MD  escitalopram (LEXAPRO) 10 MG tablet Take 2 tablets (20 mg total) by mouth daily. 07/06/19  Yes Paz, Alda Berthold, MD  LASIX 40 MG tablet Take 40 mg by mouth 2 (two) times daily. 08/23/19  Yes [provider]  loperamide (IMODIUM A-D) 2 MG tablet Take 2 mg by mouth 4 (four) times daily as needed for diarrhea or loose stools.   Yes  [provider]  Multiple Vitamins-Minerals (CENTRUM SILVER PO) Take 1 tablet by mouth daily.    Yes [provider]  ondansetron (ZOFRAN) 4 MG tablet Take 1 tablet (4 mg total) by mouth 4 (four) times daily. Patient taking differently: Take 4 mg by mouth 4 (four) times daily. Taking twice daily 09/10/19  Yes Paz, Alda Berthold, MD  pantoprazole (PROTONIX) 40 MG tablet Take 1 tablet (40 mg total) by mouth daily before breakfast. 07/24/19  Yes Paz, Alda Berthold, MD  predniSONE (DELTASONE) 10 MG tablet Take 4 tablets (40 mg total) by mouth daily with breakfast for 14 days, THEN 3 tablets (30 mg total) daily with breakfast for 14 days, THEN 2 tablets (20 mg total) daily with breakfast for 26 days. 09/18/19 11/11/19 Yes Patrecia Pour, MD  vitamin B-12 (CYANOCOBALAMIN) 1000 MCG tablet Take 1,000 mcg by mouth daily.     Yes [provider]  vitamin C (ASCORBIC ACID) 500 MG tablet Take 500 mg by mouth daily.   Yes [provider]  hydrALAZINE (APRESOLINE) 10 MG tablet Take 1 tablet (10 mg total) by mouth every 8 (eight) hours. 08/10/19 09/09/19  Cherylann Ratel A, DO  nitroGLYCERIN (NITROGLYN) 2 % ointment Apply 1 inch topically every 12 (twelve) hours. Apply 1 inch topically to rectum every 12 hours 09/18/19   Patrecia Pour, MD    Allergies    Codeine, Morphine, Oxycodone-acetaminophen, Sulfonamide derivatives, and Tramadol hcl  Review of Systems   Review of Systems  Constitutional: Negative for chills and fever.  HENT: Negative for congestion and rhinorrhea.   Eyes: Negative for redness and visual disturbance.  Respiratory: Positive for shortness of breath. Negative for wheezing.   Cardiovascular: Negative for chest pain and palpitations.  Gastrointestinal: Positive for blood in stool. Negative for abdominal pain, nausea and vomiting.  Genitourinary: Negative for dysuria and urgency.  Musculoskeletal: Negative for arthralgias and myalgias.  Skin: Negative for pallor and wound.    Neurological: Positive for weakness. Negative for dizziness and headaches.    Physical Exam Updated Vital Signs BP (!) 107/32   Pulse 66   Temp (!) 97.5 F (36.4 C) (Oral)   Resp 14   SpO2 100%   Physical Exam Vitals and nursing note reviewed.  Constitutional:      General:  She is not in acute distress.    Appearance: She is well-developed. She is not diaphoretic.     Comments: Marked pallor  HENT:     Head: Normocephalic and atraumatic.  Eyes:     Pupils: Pupils are equal, round, and reactive to light.  Cardiovascular:     Rate and Rhythm: Tachycardia present. Rhythm irregularly irregular.     Heart sounds: No murmur. No friction rub. No gallop.   Pulmonary:     Effort: Pulmonary effort is normal.     Breath sounds: No wheezing or rales.     Comments: Tunneled catheter to the right chest wall without erythema drainage Abdominal:     General: There is no distension.     Palpations: Abdomen is soft.     Tenderness: There is no abdominal tenderness.  Musculoskeletal:        General: No tenderness.     Cervical back: Normal range of motion and neck supple.  Skin:    General: Skin is warm and dry.  Neurological:     Mental Status: She is alert and oriented to person, place, and time.  Psychiatric:        Behavior: Behavior normal.     ED Results / Procedures / Treatments   Labs (all labs ordered are listed, but only abnormal results are displayed) Labs Reviewed  BASIC METABOLIC PANEL - Abnormal; Notable for the following components:      Result Value   Glucose, Bld 107 (*)    BUN 56 (*)    Creatinine, Ser 6.08 (*)    Calcium 7.2 (*)    GFR calc non Af Amer 6 (*)    GFR calc Af Amer 7 (*)    All other components within normal limits  CBC - Abnormal; Notable for the following components:   RBC 2.20 (*)    Hemoglobin 7.1 (*)    HCT 23.9 (*)    MCV 108.6 (*)    MCHC 29.7 (*)    RDW 19.8 (*)    All other components within normal limits  CBG MONITORING, ED -  Abnormal; Notable for the following components:   Glucose-Capillary 104 (*)    All other components within normal limits  POC OCCULT BLOOD, ED - Abnormal; Notable for the following components:   Fecal Occult Bld POSITIVE (*)    All other components within normal limits  SARS CORONAVIRUS 2 (TAT 6-24 HRS)  URINALYSIS, ROUTINE W REFLEX MICROSCOPIC  PREPARE RBC (CROSSMATCH)  TYPE AND SCREEN    EKG EKG Interpretation  Date/Time:  Tuesday October 02 2019 10:55:37 EST Ventricular Rate:  158 PR Interval:    QRS Duration: 84 QT Interval:  298 QTC Calculation: 483 R Axis:   73 Text Interpretation: Atrial fibrillation with rapid ventricular response Marked ST abnormality, possible inferior subendocardial injury Abnormal ECG No significant change since last tracing st changes likely rate related Otherwise no significant change Confirmed by Deno Etienne [phone]) on 10/02/2019 11:08:04 AM   Radiology DG Chest Portable 1 View  Result Date: 10/02/2019 CLINICAL DATA:  Dyspnea, weakness EXAM: PORTABLE CHEST 1 VIEW COMPARISON:  09/11/2019 chest radiograph. FINDINGS: Right internal jugular central venous catheter terminates at cavoatrial junction. Surgical clips in the left greater than right axilla, unchanged. Stable cardiomediastinal silhouette with mild cardiomegaly. No pneumothorax. Possible trace bilateral pleural effusions. No pulmonary edema. No acute consolidative airspace disease. Stable minimal scarring versus atelectasis in the mid to lower left lung. IMPRESSION: 1. Mild cardiomegaly without pulmonary edema.  2. Possible trace bilateral pleural effusions. 3. Stable minimal scarring versus atelectasis in the mid to lower left lung. No acute pulmonary disease. Electronically Signed   By: Ilona Sorrel M.D.   On: 10/02/2019 11:23    Procedures Procedures (including critical care time) EJ placement: 18 gauge IV placed in L EJ. Skin prepped with alcohol pads, L EJ identified with Valsalva. Cannulated  with good return of dark, non-pulsatile blood. Tachyderm placed after easily flushed with NS.  Medications Ordered in ED Medications  0.9 %  sodium chloride infusion (Manually program via Guardrails IV Fluids) (has no administration in time range)  diltiazem (CARDIZEM) 1 mg/mL load via infusion 20 mg (has no administration in time range)    And  diltiazem (CARDIZEM) 125 mg in dextrose 5% 125 mL (1 mg/mL) infusion (has no administration in time range)  predniSONE (DELTASONE) tablet 20 mg (has no administration in time range)  hydrocortisone (ANUSOL-HC) suppository 25 mg (has no administration in time range)  Chlorhexidine Gluconate Cloth 2 % PADS 6 each (has no administration in time range)  Darbepoetin Alfa (ARANESP) injection 200 mcg (has no administration in time range)  sodium chloride flush (NS) 0.9 % injection 3 mL (3 mLs Intravenous Given 10/02/19 1359)  metoprolol tartrate (LOPRESSOR) injection 2.5 mg (2.5 mg Intravenous Given 10/02/19 1403)    ED Course  I have reviewed the triage vital signs and the nursing notes.  Pertinent labs & imaging results that were available during my care of the patient were reviewed by me and considered in my medical decision making (see chart for details).    MDM Rules/Calculators/A&P                      83 yo F with a chief complaints of A. fib with RVR.  Patient without history of documented A. fib.  Patient is chronically ill and has multiple possible causes.  Patient has a history of recurrent GI bleeds due to Crohn's.  She has had some blood in her stool.  Hemoglobin is 2 g below her baseline.  Will order transfusion.  We will give a small bolus of metoprolol.  Not clinically fluid overloaded not requiring oxygen.   No significant change in HR, discussed with Dr. Justin Mend, felt dialysis was unlikely to significantly change the patient's heart rate.  He recommended using medicines.  They are agreeable to giving a transfusion.  We will  admit.  CRITICAL CARE Performed by: Cecilio Asper   Total critical care time: 35 minutes  Critical care time was exclusive of separately billable procedures and treating other patients.  Critical care was necessary to treat or prevent imminent or life-threatening deterioration.  Critical care was time spent personally by me on the following activities: development of treatment plan with patient and/or surrogate as well as nursing, discussions with consultants, evaluation of patient's response to treatment, examination of patient, obtaining history from patient or surrogate, ordering and performing treatments and interventions, ordering and review of laboratory studies, ordering and review of radiographic studies, pulse oximetry and re-evaluation of patient's condition.  The patients results and plan were reviewed and discussed.   Any x-rays performed were independently reviewed by myself.   Differential diagnosis were considered with the presenting HPI.  Medications  0.9 %  sodium chloride infusion (Manually program via Guardrails IV Fluids) (has no administration in time range)  diltiazem (CARDIZEM) 1 mg/mL load via infusion 20 mg (has no administration in time range)  And  diltiazem (CARDIZEM) 125 mg in dextrose 5% 125 mL (1 mg/mL) infusion (has no administration in time range)  predniSONE (DELTASONE) tablet 20 mg (has no administration in time range)  hydrocortisone (ANUSOL-HC) suppository 25 mg (has no administration in time range)  Chlorhexidine Gluconate Cloth 2 % PADS 6 each (has no administration in time range)  Darbepoetin Alfa (ARANESP) injection 200 mcg (has no administration in time range)  sodium chloride flush (NS) 0.9 % injection 3 mL (3 mLs Intravenous Given 10/02/19 1359)  metoprolol tartrate (LOPRESSOR) injection 2.5 mg (2.5 mg Intravenous Given 10/02/19 1403)    Vitals:   10/02/19 1430 10/02/19 1500 10/02/19 1530 10/02/19 1531  BP: (!) 100/49 (!) 107/41  (!) 107/32 (!) 107/32  Pulse: 60 63 63 66  Resp: (!) 9 10 (!) 9 14  Temp:    (!) 97.5 F (36.4 C)  TempSrc:    Oral  SpO2: 99% 98% 100%     Final diagnoses:  Atrial fibrillation with rapid ventricular response (HCC)  Symptomatic anemia  Other hypervolemia    Admission/ observation were discussed with the admitting physician, patient and/or family and they are comfortable with the plan.    Final Clinical Impression(s) / ED Diagnoses Final diagnoses:  Atrial fibrillation with rapid ventricular response (HCC)  Symptomatic anemia  Other hypervolemia    Rx / DC Orders ED Discharge Orders    None       Deno Etienne, DO 10/02/19 1544

## 2019-10-02 NOTE — ED Triage Notes (Signed)
C/o generalized weakness and SOB since yesterday.  Denies pain.  Pt due for dialysis today.

## 2019-10-02 NOTE — ED Notes (Signed)
EKG showed Afib with RVR.  Charge RN notified and pt to treatment room

## 2019-10-02 NOTE — Consult Note (Addendum)
[facility] Renal Consultation Note    Indication for Consultation:  Management of ESRD/hemodialysis; anemia, hypertension/volume and secondary hyperparathyroidism PCP:  Kathlene November, MD  HPI: Samantha Mckenzie is a 83 y.o. female who is a relative new start to HD in November 2020 dialyzing with [facility].  She has chronic edema in LE and some in lymphedema in left arm. She is compliant with dialysis.  Her EDW is gradually being lowered.  She called HD today informing them she was weak and couldn't get OOB and was coming to the ED. She noted dark stools yesterday.  Evaluation here has shown progressive decline in hgb to 7.1 EKG showed Afib with RVR, ST changes K is low at 3.5 Patient is agreeable to transfusion.CXR shows no pul edema ? tra bilateral pleural effusions scarring vs abtx mid to LLL. She lives with her husband who also has health issues and has two supportive sons.  A HH RN fills a pill box for her and seh doesn't know if she takes coreg though it is on her list.  She has had chronic lymphedema on left since mastectomy and has peripheral neuropathy related to chemotherapy for breast cancer. Appetite is poor.  She has no fever chills, known COVID contacts, N, or vomiting. She has SOB with exertion and no stamina. Normally she has LE edema.  Past Medical History:  Diagnosis Date  . Allergic rhinitis   . Anemia   . Breast CA ([place])    surgery, chemo, XRT; had peripheral neuropathy (imbalance at times) after chemo  . Cellulitis LEFT arm recurrent 12/08/2011  . Chronic renal disease, stage IV (H[facility])    Dr. Moshe Cipro  . Depression   . Fatigue   . GERD (gastroesophageal reflux disease)    gastritis, EGD 02/2007  . Gout   . Hyperlipidemia   . Hypertension   . Macular degeneration 2011  . Normal cardiac stress test 12/2014  . Osteoarthritis   . Renal insufficiency   . Syncope 09/12/2019   Past Surgical History:  Procedure Laterality Date  . AV FISTULA PLACEMENT Right 08/10/2019    Procedure: ARTERIOVENOUS (AV) FISTULA CREATION VERSUS GRAFT PLACEMENT;  Surgeon: Angelia Mould, MD;  Location: [place];  Service: Vascular;  Laterality: Right;  . BIOPSY  06/06/2019   Procedure: BIOPSY;  Surgeon: Ladene Artist, MD;  Location: [facility];  Service: Endoscopy;;  . BIOPSY  09/17/2019   Procedure: BIOPSY;  Surgeon: Irving Copas., MD;  Location: [facility] ENDOSCOPY;  Service: Gastroenterology;;  . cataracts bilaterally  1 -2012  . COLONOSCOPY WITH PROPOFOL N/A 06/06/2019   Procedure: COLONOSCOPY WITH PROPOFOL;  Surgeon: Ladene Artist, MD;  Location: [facility] ENDOSCOPY;  Service: Endoscopy;  Laterality: N/A;  . COLONOSCOPY WITH PROPOFOL N/A 09/17/2019   Procedure: COLONOSCOPY WITH PROPOFOL;  Surgeon: Rush Landmark Telford Nab., MD;  Location: [place];  Service: Gastroenterology;  Laterality: N/A;  . ESOPHAGOGASTRODUODENOSCOPY (EGD) WITH PROPOFOL N/A 09/17/2019   Procedure: ESOPHAGOGASTRODUODENOSCOPY (EGD) WITH PROPOFOL;  Surgeon: Rush Landmark Telford Nab., MD;  Location: [place];  Service: Gastroenterology;  Laterality: N/A;  . FLEXIBLE SIGMOIDOSCOPY N/A 09/15/2019   Procedure: FLEXIBLE SIGMOIDOSCOPY;  Surgeon: Yetta Flock, MD;  Location: [place];  Service: Gastroenterology;  Laterality: N/A;  . IR FLUORO GUIDE CV LINE RIGHT  08/03/2019  . IR US GUIDE VASC ACCESS RIGHT  08/03/2019  . LUNG BIOPSY  1999   neg  . MASTECTOMY Left 1999   and lymphnodes   . POLYPECTOMY  09/17/2019   Procedure:  POLYPECTOMY;  Surgeon: Mansouraty, Telford Nab., MD;  Location: [facility] ENDOSCOPY;  Service: Gastroenterology;;   Family History  Problem Relation Age of Onset  . Breast cancer Other        ? of   . Heart attack Father 35  . Diabetes Sister        2  . Hypertension Sister   . Colon cancer Neg Hx   . Stomach cancer Neg Hx    Social History:  reports that she has never smoked. She has never used smokeless tobacco. She reports previous alcohol use. She reports that she  does not use drugs. Allergies  Allergen Reactions  . Codeine Nausea And Vomiting  . Morphine Nausea And Vomiting  . Oxycodone-Acetaminophen Nausea And Vomiting  . Sulfonamide Derivatives Nausea And Vomiting  . Tramadol Hcl Nausea And Vomiting   Prior to Admission medications   Medication Sig Start Date End Date Taking? Authorizing Provider  acetaminophen (TYLENOL) 325 MG tablet Take 2 tablets (650 mg total) by mouth every 6 (six) hours as needed for mild pain, fever or headache (or Fever >/= 101). 04/12/19   Emokpae, Courage, MD  allopurinol (ZYLOPRIM) 100 MG tablet Take 1 tablet (100 mg total) by mouth daily. 06/29/19   Granville Lewis C, PA-C  atorvastatin (LIPITOR) 20 MG tablet Take 1 tablet (20 mg total) by mouth daily. 06/29/19   Wille Celeste, PA-C  Calcium Carb-Cholecalciferol (CALTRATE 600+D3 PO) Take 1 tablet by mouth 2 (two) times daily.     [provider]  carvedilol (COREG) 6.25 MG tablet Take 1 tablet (6.25 mg total) by mouth 2 (two) times daily with a meal. 09/10/19 10/10/19  Colon Branch, MD  Cholecalciferol (VITAMIN D3) 1000 UNITS CAPS Take 1 capsule by mouth daily.     [provider]  Darbepoetin Alfa (ARANESP) 100 MCG/0.5ML SOSY injection Inject 0.5 mLs (100 mcg total) into the vein every Saturday with hemodialysis. 08/11/19   Cherylann Ratel A, DO  dicyclomine (BENTYL) 10 MG capsule Take 2 capsules (20 mg total) by mouth 4 (four) times daily -  before meals and at bedtime. Patient taking differently: Take 20 mg by mouth 4 (four) times daily -  before meals and at bedtime. Taking twice daily 06/29/19   Wille Celeste, PA-C  dronabinol (MARINOL) 2.5 MG capsule Take 1 capsule (2.5 mg total) by mouth 2 (two) times daily before a meal. 09/10/19   Colon Branch, MD  escitalopram (LEXAPRO) 10 MG tablet Take 2 tablets (20 mg total) by mouth daily. 07/06/19   Colon Branch, MD  hydrALAZINE (APRESOLINE) 10 MG tablet Take 1 tablet (10 mg total) by mouth every 8 (eight) hours.  08/10/19 09/09/19  Cherylann Ratel A, DO  LASIX 40 MG tablet Take 40 mg by mouth 2 (two) times daily. 08/23/19   [provider]  loperamide (IMODIUM A-D) 2 MG tablet Take 2 mg by mouth 4 (four) times daily as needed for diarrhea or loose stools.    [provider]  Multiple Vitamins-Minerals (CENTRUM SILVER PO) Take 1 tablet by mouth daily.     [provider]  nitroGLYCERIN (NITROGLYN) 2 % ointment Apply 1 inch topically every 12 (twelve) hours. Apply 1 inch topically to rectum every 12 hours 09/18/19   Patrecia Pour, MD  ondansetron (ZOFRAN) 4 MG tablet Take 1 tablet (4 mg total) by mouth 4 (four) times daily. Patient taking differently: Take 4 mg by mouth 4 (four) times daily. Taking twice daily 09/10/19  Colon Branch, MD  pantoprazole (PROTONIX) 40 MG tablet Take 1 tablet (40 mg total) by mouth daily before breakfast. 07/24/19   Colon Branch, MD  predniSONE (DELTASONE) 10 MG tablet Take 4 tablets (40 mg total) by mouth daily with breakfast for 14 days, THEN 3 tablets (30 mg total) daily with breakfast for 14 days, THEN 2 tablets (20 mg total) daily with breakfast for 26 days. 09/18/19 11/11/19  Patrecia Pour, MD  vitamin B-12 (CYANOCOBALAMIN) 1000 MCG tablet Take 1,000 mcg by mouth daily.      [provider]  vitamin C (ASCORBIC ACID) 500 MG tablet Take 500 mg by mouth daily.    [provider]   Current Facility-Administered Medications  Medication Dose Route Frequency Provider Last Rate Last Admin  . 0.9 %  sodium chloride infusion (Manually program via Guardrails IV Fluids)   Intravenous Once Deno Etienne, DO      . diltiazem (CARDIZEM) 1 mg/mL load via infusion 20 mg  20 mg Intravenous Once Deno Etienne, DO       And  . diltiazem (CARDIZEM) 125 mg in dextrose 5% 125 mL (1 mg/mL) infusion  5-15 mg/hr Intravenous Continuous Deno Etienne, DO      . metoprolol tartrate (LOPRESSOR) injection 2.5 mg  2.5 mg Intravenous Once Floyd, Dan, DO      . sodium chloride  flush (NS) 0.9 % injection 3 mL  3 mL Intravenous Once Deno Etienne, DO       Current Outpatient Medications  Medication Sig Dispense Refill  . acetaminophen (TYLENOL) 325 MG tablet Take 2 tablets (650 mg total) by mouth every 6 (six) hours as needed for mild pain, fever or headache (or Fever >/= 101). 15 tablet 0  . allopurinol (ZYLOPRIM) 100 MG tablet Take 1 tablet (100 mg total) by mouth daily. 30 tablet 0  . atorvastatin (LIPITOR) 20 MG tablet Take 1 tablet (20 mg total) by mouth daily. 30 tablet 0  . Calcium Carb-Cholecalciferol (CALTRATE 600+D3 PO) Take 1 tablet by mouth 2 (two) times daily.     . carvedilol (COREG) 6.25 MG tablet Take 1 tablet (6.25 mg total) by mouth 2 (two) times daily with a meal. 60 tablet 6  . Cholecalciferol (VITAMIN D3) 1000 UNITS CAPS Take 1 capsule by mouth daily.     . Darbepoetin Alfa (ARANESP) 100 MCG/0.5ML SOSY injection Inject 0.5 mLs (100 mcg total) into the vein every Saturday with hemodialysis. 4.2 mL   . dicyclomine (BENTYL) 10 MG capsule Take 2 capsules (20 mg total) by mouth 4 (four) times daily -  before meals and at bedtime. (Patient taking differently: Take 20 mg by mouth 4 (four) times daily -  before meals and at bedtime. Taking twice daily) 240 capsule 0  . dronabinol (MARINOL) 2.5 MG capsule Take 1 capsule (2.5 mg total) by mouth 2 (two) times daily before a meal. 60 capsule 1  . escitalopram (LEXAPRO) 10 MG tablet Take 2 tablets (20 mg total) by mouth daily. 180 tablet 0  . hydrALAZINE (APRESOLINE) 10 MG tablet Take 1 tablet (10 mg total) by mouth every 8 (eight) hours. 90 tablet 0  . LASIX 40 MG tablet Take 40 mg by mouth 2 (two) times daily.    Marland Kitchen loperamide (IMODIUM A-D) 2 MG tablet Take 2 mg by mouth 4 (four) times daily as needed for diarrhea or loose stools.    . Multiple Vitamins-Minerals (CENTRUM SILVER PO) Take 1 tablet by mouth daily.     Marland Kitchen  nitroGLYCERIN (NITROGLYN) 2 % ointment Apply 1 inch topically every 12 (twelve) hours. Apply 1 inch  topically to rectum every 12 hours 30 g 0  . ondansetron (ZOFRAN) 4 MG tablet Take 1 tablet (4 mg total) by mouth 4 (four) times daily. (Patient taking differently: Take 4 mg by mouth 4 (four) times daily. Taking twice daily) 60 tablet 0  . pantoprazole (PROTONIX) 40 MG tablet Take 1 tablet (40 mg total) by mouth daily before breakfast. 30 tablet 1  . predniSONE (DELTASONE) 10 MG tablet Take 4 tablets (40 mg total) by mouth daily with breakfast for 14 days, THEN 3 tablets (30 mg total) daily with breakfast for 14 days, THEN 2 tablets (20 mg total) daily with breakfast for 26 days. 150 tablet 0  . vitamin B-12 (CYANOCOBALAMIN) 1000 MCG tablet Take 1,000 mcg by mouth daily.      . vitamin C (ASCORBIC ACID) 500 MG tablet Take 500 mg by mouth daily.     Labs: Basic Metabolic Panel: Recent Labs  Lab 10/02/19 1052  NA 139  K 3.5  CL 99  CO2 27  GLUCOSE 107*  BUN 56*  CREATININE 6.08*  CALCIUM 7.2*   Liver Function Tests: No results for input(s): AST, ALT, ALKPHOS, BILITOT, PROT, ALBUMIN in the last 168 hours. No results for input(s): LIPASE, AMYLASE in the last 168 hours. No results for input(s): AMMONIA in the last 168 hours. CBC: Recent Labs  Lab 10/02/19 1052  WBC 10.5  HGB 7.1*  HCT 23.9*  MCV 108.6*  PLT 165   Cardiac Enzymes: No results for input(s): CKTOTAL, CKMB, CKMBINDEX, TROPONINI in the last 168 hours. CBG: Recent Labs  Lab 10/02/19 1051  GLUCAP 104*   Iron Studies: No results for input(s): IRON, TIBC, TRANSFERRIN, FERRITIN in the last 72 hours. Studies/Results: DG Chest Portable 1 View  Result Date: 10/02/2019 CLINICAL DATA:  Dyspnea, weakness EXAM: PORTABLE CHEST 1 VIEW COMPARISON:  09/11/2019 chest radiograph. FINDINGS: Right internal jugular central venous catheter terminates at cavoatrial junction. Surgical clips in the left greater than right axilla, unchanged. Stable cardiomediastinal silhouette with mild cardiomegaly. No pneumothorax. Possible trace  bilateral pleural effusions. No pulmonary edema. No acute consolidative airspace disease. Stable minimal scarring versus atelectasis in the mid to lower left lung. IMPRESSION: 1. Mild cardiomegaly without pulmonary edema. 2. Possible trace bilateral pleural effusions. 3. Stable minimal scarring versus atelectasis in the mid to lower left lung. No acute pulmonary disease. Electronically Signed   By: Ilona Sorrel M.D.   On: 10/02/2019 11:23    ROS: As per HPI otherwise negative.  Physical Exam: Vitals:   10/02/19 1046 10/02/19 1116 10/02/19 1118 10/02/19 1300  BP: (!) 111/55  102/64 (!) 118/42  Pulse: 90 69  67  Resp: 16 14 16  (!) 8  Temp: 97.9 F (36.6 C)     TempSrc: Oral     SpO2: 96% 100%  98%     General: ill appearing frail elderly woman Head: NCAT sclera not icteric MMM Neck: Supple.  Lungs: CTA bilaterally without wheezes, rales, or rhonchi. Breathing is unlabored. Heart: RRR with S1 S2.  Abdomen: soft NT + BS anasarca Extremities: tr - LE edema but feet elevated no open wounds , L > R upper extremity edema - (left is more so due to lymphedema which she says comes and goes) Neuro: A & O  X 3. Moves all extremities spontaneously. Psych:  Responds to questions appropriately with a normal affect. Dialysis Access:TDC and right AVGG active  for several weeks, some bruising noted  Dialysis Orders: TTS 4 hr AF EDW 51 2K 2.25 Ca TDC no heparin due to hx + stools - venofer 50/Mircera 225 q 2 weeks - last 12/10 has right upper AVGG - and right TDC   Assessment/Plan: 1.  Afib with RVR - on card drip -in NSR now 2. Hypkalemia - Ks running 4- 4.3 at home HD unit but 3.5 today which would have been pre HD - favor changing to 3 K bath at discharge and order weekly Ks 3.  ESRD - TTS - no acute need for HD today - will defer until Wednesday due to high inpatient census - if no problems with AVGG tomorrow should get [place] out while here 4.  Hypertension/volume  - BP stable without drops - average  net UF 2.5 +/- EDW depending on gains 5.  Anemia  - hgb 7.1 drop  From hgb 8.8 12/10 - - has has been receiving max ESA dose of Mircera 225 x 6 weeks - with her last dose being given 12/10 - hx of heme positive stools - needs transfusion 1 unit PRBC- due for redose 12/24 6.  Metabolic bone disease -  No binders or VDRA  7.  Nutrition - poor intake protein low/supplments ordered 8. Hx Crohn's disease - on steroid taper 9. Hx GIB during recent admission - GI following 10. Depression - PCP following - current meds 11. DNR - noted on [facility] note/MOST completed in Epic- her dialysis unit needs to be notified of this at discharge  Myriam Jacobson, PA-C [place] [phone] 10/02/2019, 1:59 PM

## 2019-10-02 NOTE — ED Notes (Signed)
Son and admitting MD at bedside

## 2019-10-02 NOTE — Consult Note (Addendum)
[facility] Consult: 12:59 PM 10/02/2019  LOS: 0 days    Referring Provider: Dr Tyrone Nine in ED  Primary Care Physician:  Colon Branch, MD Primary Gastroenterologist:  Dr. Henrene Pastor     Reason for Consultation:  Acute on chronic anemia.     HPI: Samantha Mckenzie is a 83 y.o. female.  PMH breast cancer.  Anemia, on Venofer and Mircera q 2 weeks.  Thrombocytopenia.  CAD.  ESRD.  Started HD in 07/2019.  Hypertension.  COPD.  Diastolic heart failure.  Diabetes.  Diabetic neuropathy.  Anxiety, depression.  01/2015 EGD.  Normal. Crohn's disease diagnosed 05/2019. 05/30/19 CTAP w/o contrast: Long segment of thickening and inflammatory changes at the terminal ileum.  Mild distention of ileum proximal to area of inflammation, raising concern for stricture but no SBO. 06/06/2019 colonoscopy.  Left-sided diverticulosis.  Stricture at the terminal ileum, pathology showed no significant findings, no inflammation.  Despite negative pathology there was high suspicion for Crohn's disease. Treated with IV Solu-Medrol, discharged on 10-week prednisone taper. At 07/12/2019 office follow-up with Dr. Henrene Pastor outlined prednisone taper from 40 to 20 mg through 11/30.  GI inpatient consult for macrocytic anemia and pt reported black stools during 10/22 -10/30 admission.  Dr. Ardis Hughs advised upping PPI to bid and finishing the prednisone taper.  Readmitted 09/11/2019 -09/18/19 with Hgb 8.5, (8.8 on 10/30).  MCV 111.  4 months of diarrhea mixed with red blood.  Frequency of stools declined to 2 x day with Imodium but still seeing streaks of blood.  No abdominal, rectal pain, no N/V. Ongoing anorexia. 09/14/2019 CTAP with contrast: Thickening and inflammatory changes in a loop of mid to distal ileum suggesting enteritis likely Crohn's.  Trace right pelvic  ascites.  Small pleural effusions and atx.  09/15/2019 flexible sigmoidoscopy: Poor prep large, liquid, brown stool throughout visualized portion, no blood, + sigmoid diverticulosis.  No colonic inflammation.  Nonbleeding internal hemorrhoids. 09/17/2019 EGD: Small HH.  Biopsy of normal-appearing stomach and duodenum: Reactive gastropathy.   09/17/19 colonoscopy with suboptimal prep: Anal fissure, nonbleeding internal/external hemorrhoids.  Rectosigmoid tics, 2 small polyps cold snared from descending colon.  Dr. Rush Landmark suspected hemorrhoidal bleeding.  Random TI, and pan colonic biopsies all normal.  Polyps showed low-grade dysplasia with tubular architecture, negative for high-grade dysplasia.   Anusol suppositories and topical rectal nitroglycerin added.   GI plan was prednisone 20 mg/day.  There is some ? As to whether discharging MD  RXd a new taper starting 09/18/19 at 40 mg/2 weeks, 30 mg/2 weeks, 20 mg/26 days. Has ROV with [place] PA on 12/30  Patient continues to feel very weak, and suffering from general debility.  Feels dizzy and unsteady, requires frequent rest.  Chronic LUE edema.  Her prealbumin is low and Dr. Larose Kells suggested starting Protostat supplements as of the office visit of 12/14.  Patient complains of increased depression and MD also considered adding low-dose Wellbutrin. Pts husband reported occasional confusion.  Weakness progressive to the point where she cannot move about the house without the assistance of her husband.  Shortness  of breath started today.  She continues to have anorexia without nausea or vomiting or abdominal pain.  Still seeing blood in her stools which occur once or twice a day.  She is due for dialysis today. Hgb is 7.1, it was 9 on 09/18/2019.  MCV is 108.6.  Platelets 165.  INR 1. CXR shows mild CM, no pulmonary edema.  ?  Trace bil pleural effusions, scarring vs atx mid to LLL  Past Medical History:  Diagnosis Date  . Allergic rhinitis   . Anemia   .  Breast CA ([place])    surgery, chemo, XRT; had peripheral neuropathy (imbalance at times) after chemo  . Cellulitis    Left arm, recurrent   . Cellulitis LEFT arm recurrent 12/08/2011  . Chronic renal disease, stage IV (HCC)    Dr. Moshe Cipro  . Depression   . Fatigue   . GERD (gastroesophageal reflux disease)    gastritis, EGD 02/2007  . Gout   . Hyperlipidemia   . Hypertension   . Macular degeneration 2011  . Normal cardiac stress test 12/2014  . Osteoarthritis   . Renal insufficiency   . Syncope 09/12/2019    Past Surgical History:  Procedure Laterality Date  . AV FISTULA PLACEMENT Right 08/10/2019   Procedure: ARTERIOVENOUS (AV) FISTULA CREATION VERSUS GRAFT PLACEMENT;  Surgeon: Angelia Mould, MD;  Location: [place];  Service: Vascular;  Laterality: Right;  . BIOPSY  06/06/2019   Procedure: BIOPSY;  Surgeon: Ladene Artist, MD;  Location: [facility];  Service: Endoscopy;;  . BIOPSY  09/17/2019   Procedure: BIOPSY;  Surgeon: Irving Copas., MD;  Location: [facility];  Service: Gastroenterology;;  . cataracts bilaterally  1 -2012  . COLONOSCOPY WITH PROPOFOL N/A 06/06/2019   Procedure: COLONOSCOPY WITH PROPOFOL;  Surgeon: Ladene Artist, MD;  Location: [facility] ENDOSCOPY;  Service: Endoscopy;  Laterality: N/A;  . COLONOSCOPY WITH PROPOFOL N/A 09/17/2019   Procedure: COLONOSCOPY WITH PROPOFOL;  Surgeon: Rush Landmark Telford Nab., MD;  Location: [place];  Service: Gastroenterology;  Laterality: N/A;  . ESOPHAGOGASTRODUODENOSCOPY (EGD) WITH PROPOFOL N/A 09/17/2019   Procedure: ESOPHAGOGASTRODUODENOSCOPY (EGD) WITH PROPOFOL;  Surgeon: Rush Landmark Telford Nab., MD;  Location: [place];  Service: Gastroenterology;  Laterality: N/A;  . FLEXIBLE SIGMOIDOSCOPY N/A 09/15/2019   Procedure: FLEXIBLE SIGMOIDOSCOPY;  Surgeon: Yetta Flock, MD;  Location: [place];  Service: Gastroenterology;  Laterality: N/A;  . IR FLUORO GUIDE CV LINE RIGHT  08/03/2019  . IR US  GUIDE VASC ACCESS RIGHT  08/03/2019  . LUNG BIOPSY  1999   neg  . MASTECTOMY Left 1999   and lymphnodes   . POLYPECTOMY  09/17/2019   Procedure: POLYPECTOMY;  Surgeon: Mansouraty, Telford Nab., MD;  Location: [place];  Service: Gastroenterology;;    Prior to Admission medications   Medication Sig Start Date End Date Taking? Authorizing Provider  acetaminophen (TYLENOL) 325 MG tablet Take 2 tablets (650 mg total) by mouth every 6 (six) hours as needed for mild pain, fever or headache (or Fever >/= 101). 04/12/19   Emokpae, Courage, MD  allopurinol (ZYLOPRIM) 100 MG tablet Take 1 tablet (100 mg total) by mouth daily. 06/29/19   Granville Lewis C, PA-C  atorvastatin (LIPITOR) 20 MG tablet Take 1 tablet (20 mg total) by mouth daily. 06/29/19   Wille Celeste, PA-C  Calcium Carb-Cholecalciferol (CALTRATE 600+D3 PO) Take 1 tablet by mouth 2 (two) times daily.     [provider]  carvedilol (COREG) 6.25 MG tablet Take 1 tablet (  6.25 mg total) by mouth 2 (two) times daily with a meal. 09/10/19 10/10/19  Colon Branch, MD  Cholecalciferol (VITAMIN D3) 1000 UNITS CAPS Take 1 capsule by mouth daily.     [provider]  Darbepoetin Alfa (ARANESP) 100 MCG/0.5ML SOSY injection Inject 0.5 mLs (100 mcg total) into the vein every Saturday with hemodialysis. 08/11/19   Cherylann Ratel A, DO  dicyclomine (BENTYL) 10 MG capsule Take 2 capsules (20 mg total) by mouth 4 (four) times daily -  before meals and at bedtime. Patient taking differently: Take 20 mg by mouth 4 (four) times daily -  before meals and at bedtime. Taking twice daily 06/29/19   Wille Celeste, PA-C  dronabinol (MARINOL) 2.5 MG capsule Take 1 capsule (2.5 mg total) by mouth 2 (two) times daily before a meal. 09/10/19   Colon Branch, MD  escitalopram (LEXAPRO) 10 MG tablet Take 2 tablets (20 mg total) by mouth daily. 07/06/19   Colon Branch, MD  hydrALAZINE (APRESOLINE) 10 MG tablet Take 1 tablet (10 mg total) by mouth every 8 (eight)  hours. 08/10/19 09/09/19  Cherylann Ratel A, DO  LASIX 40 MG tablet Take 40 mg by mouth 2 (two) times daily. 08/23/19   [provider]  loperamide (IMODIUM A-D) 2 MG tablet Take 2 mg by mouth 4 (four) times daily as needed for diarrhea or loose stools.    [provider]  Multiple Vitamins-Minerals (CENTRUM SILVER PO) Take 1 tablet by mouth daily.     [provider]  nitroGLYCERIN (NITROGLYN) 2 % ointment Apply 1 inch topically every 12 (twelve) hours. Apply 1 inch topically to rectum every 12 hours 09/18/19   Patrecia Pour, MD  ondansetron (ZOFRAN) 4 MG tablet Take 1 tablet (4 mg total) by mouth 4 (four) times daily. Patient taking differently: Take 4 mg by mouth 4 (four) times daily. Taking twice daily 09/10/19   Colon Branch, MD  pantoprazole (PROTONIX) 40 MG tablet Take 1 tablet (40 mg total) by mouth daily before breakfast. 07/24/19   Colon Branch, MD  predniSONE (DELTASONE) 10 MG tablet Take 4 tablets (40 mg total) by mouth daily with breakfast for 14 days, THEN 3 tablets (30 mg total) daily with breakfast for 14 days, THEN 2 tablets (20 mg total) daily with breakfast for 26 days. 09/18/19 11/11/19  Patrecia Pour, MD  vitamin B-12 (CYANOCOBALAMIN) 1000 MCG tablet Take 1,000 mcg by mouth daily.      [provider]  vitamin C (ASCORBIC ACID) 500 MG tablet Take 500 mg by mouth daily.    [provider]    Scheduled Meds: . sodium chloride   Intravenous Once  . metoprolol tartrate  2.5 mg Intravenous Once  . sodium chloride flush  3 mL Intravenous Once   Infusions:  PRN Meds:    Allergies as of 10/02/2019 - Review Complete 10/01/2019  Allergen Reaction Noted  . Codeine Nausea And Vomiting 07/07/2007  . Morphine Nausea And Vomiting 07/07/2007  . Oxycodone-acetaminophen Nausea And Vomiting 07/07/2007  . Sulfonamide derivatives Nausea And Vomiting 07/07/2007  . Tramadol hcl Nausea And Vomiting 07/07/2007    Family History  Problem Relation Age  of Onset  . Breast cancer Other        ? of   . Heart attack Father 59  . Diabetes Sister        2  . Hypertension Sister   . Colon cancer Neg Hx   . Stomach  cancer Neg Hx     Social History   Socioeconomic History  . Marital status: Married    Spouse name: Not on file  . Number of children: 2  . Years of education: Not on file  . Highest education level: Not on file  Occupational History  . Occupation: retired     Fish farm manager: RETIRED  Tobacco Use  . Smoking status: Never Smoker  . Smokeless tobacco: Never Used  Substance and Sexual Activity  . Alcohol use: Not Currently    Alcohol/week: 0.0 standard drinks    Comment: rarely  . Drug use: No  . Sexual activity: Not Currently  Other Topics Concern  . Not on file  Social History Narrative   Lives w/ husband , 2 children in [place], 2 [place]           Social Determinants of Health   Financial Resource Strain:   . Difficulty of Paying Living Expenses: Not on file  Food Insecurity:   . Worried About Charity fundraiser in the Last Year: Not on file  . Ran Out of Food in the Last Year: Not on file  Transportation Needs: No Transportation Needs  . Lack of Transportation (Medical): No  . Lack of Transportation (Non-Medical): No  Physical Activity:   . Days of Exercise per Week: Not on file  . Minutes of Exercise per Session: Not on file  Stress:   . Feeling of Stress : Not on file  Social Connections:   . Frequency of Communication with Friends and Family: Not on file  . Frequency of Social Gatherings with Friends and Family: Not on file  . Attends Religious Services: Not on file  . Active Member of Clubs or Organizations: Not on file  . Attends Archivist Meetings: Not on file  . Marital Status: Not on file  Intimate Partner Violence:   . Fear of Current or Ex-Partner: Not on file  . Emotionally Abused: Not on file  . Physically Abused: Not on file  . Sexually Abused: Not on file    REVIEW OF  SYSTEMS: Constitutional: See HPI. ENT:  No nose bleeds Pulm: SOB CV:  No palpitations, no LE edema.  GU:  No hematuria, no frequency GI: See HPI. Heme: Blood in the stool. Transfusions: See if PRBCs in August, October, early December 2020 Neuro:  No headaches, no peripheral tingling or numbness Derm:  No itching, no rash or sores.  Endocrine:  No sweats or chills.  No polyuria or dysuria Immunization: Vaccinations reviewed, she is up-to-date on flu shot. Travel:  None.     PHYSICAL EXAM: Vital signs in last 24 hours: Vitals:   10/02/19 1116 10/02/19 1118  BP:  102/64  Pulse: 69   Resp: 14 16  Temp:    SpO2: 100%    Wt Readings from Last 3 Encounters:  09/18/19 63.5 kg  08/09/19 118 kg  08/02/19 60.6 kg    General: Ashen, pale coloring, looks chronically ill, malnourished, cachectic.  Alert, comfortable.  Laying in Trendelenburg on stretcher. Head: No facial asymmetry or swelling.  No signs of head trauma.  Flesh doughy. Eyes: No scleral icterus.  Conjunctiva pale.  EOMI. Ears: Not hard of hearing Nose: No discharge or congestion Mouth: Good dentition.  Oral mucosa moist, pink, clear.  Tongue midline. Neck: No JVD, no masses, no thyromegaly Lungs: Diminished breath sounds but clear bilaterally.  No labored breathing, no cough. Heart: Currently RRR, rate in 60s.  No MRG.  S1, S2 present. Abdomen: Not tender or distended.  Soft.  No HSM, masses, bruits, hernias.  Bowel sounds active..   Rectal: There is dark pasty stool smeared on the depends.  On DRE no masses or visible hemorrhoids, there is scant streaks of red blood which is FOBT positive.  Stool is soft. Musc/Skeltl: No joint redness or swelling. Extremities: Mild anasarca on the upper and lower extremities.  Right upper extremity AV graft. Neurologic: Alert.  Oriented x3.  Moves all 4 limbs, strength not tested.  No tremors. Skin: No rash, no telangiectasia, no sores. Tattoos: None observed Nodes: No cervical  adenopathy Psych: Flat affect, calm, cooperative.  Intake/Output from previous day: No intake/output data recorded. Intake/Output this shift: No intake/output data recorded.  LAB RESULTS: Recent Labs    10/02/19 1052  WBC 10.5  HGB 7.1*  HCT 23.9*  PLT 165   BMET Lab Results  Component Value Date   NA 139 10/02/2019   NA 135 09/18/2019   NA 134 (L) 09/18/2019   K 3.5 10/02/2019   K 4.6 09/18/2019   K 4.8 09/18/2019   CL 99 10/02/2019   CL 98 09/18/2019   CL 100 09/18/2019   CO2 27 10/02/2019   CO2 21 (L) 09/18/2019   CO2 22 09/18/2019   GLUCOSE 107 (H) 10/02/2019   GLUCOSE 157 (H) 09/18/2019   GLUCOSE 171 (H) 09/18/2019   BUN 56 (H) 10/02/2019   BUN 51 (H) 09/18/2019   BUN 50 (H) 09/18/2019   CREATININE 6.08 (H) 10/02/2019   CREATININE 4.56 (H) 09/18/2019   CREATININE 4.59 (H) 09/18/2019   CALCIUM 7.2 (L) 10/02/2019   CALCIUM 7.0 (L) 09/18/2019   CALCIUM 7.0 (L) 09/18/2019   LFT No results for input(s): PROT, ALBUMIN, AST, ALT, ALKPHOS, BILITOT, BILIDIR, IBILI in the last 72 hours. PT/INR Lab Results  Component Value Date   INR 1.0 09/11/2019   INR 1.0 08/07/2019   INR 1.0 08/03/2019   Hepatitis Panel No results for input(s): HEPBSAG, HCVAB, HEPAIGM, HEPBIGM in the last 72 hours. C-Diff No components found for: CDIFF Lipase     Component Value Date/Time   LIPASE 53 (H) 05/31/2019 0717    Drugs of Abuse     Component Value Date/Time   LABOPIA NONE DETECTED 05/16/2019 [place] DETECTED 05/16/2019 1835   LABBENZ NONE DETECTED 05/16/2019 1835   AMPHETMU NONE DETECTED 05/16/2019 1835   THCU NONE DETECTED 05/16/2019 1835   LABBARB NONE DETECTED 05/16/2019 1835     RADIOLOGY STUDIES: DG Chest Portable 1 View  Result Date: 10/02/2019 CLINICAL DATA:  Dyspnea, weakness EXAM: PORTABLE CHEST 1 VIEW COMPARISON:  09/11/2019 chest radiograph. FINDINGS: Right internal jugular central venous catheter terminates at cavoatrial junction.  Surgical clips in the left greater than right axilla, unchanged. Stable cardiomediastinal silhouette with mild cardiomegaly. No pneumothorax. Possible trace bilateral pleural effusions. No pulmonary edema. No acute consolidative airspace disease. Stable minimal scarring versus atelectasis in the mid to lower left lung. IMPRESSION: 1. Mild cardiomegaly without pulmonary edema. 2. Possible trace bilateral pleural effusions. 3. Stable minimal scarring versus atelectasis in the mid to lower left lung. No acute pulmonary disease. Electronically Signed   By: Ilona Sorrel M.D.   On: 10/02/2019 11:23      IMPRESSION:   *    Acute on chronic, macrocytic, anemia.   On oral B12 daily. Weekly Aranesp.    *    Presumptive dx Crohn's dz.  Ongoing steroids since 05/2019.  Currently  on 20 mg daily Stool frequency of 1-2 daily but still seeing blood as well as dark stools.    Rectal bleeding attributed to hemorrhoids and rectal fissure at colonoscopy 09/17/2019. Pt has not been using Anusol or topical/rectal nitroglycerin at home.    *    Weakness, SOB.  Multifactorial: A fib w RVR, anemia, ESRD and HD, steroids for past 4 months, protein malnutrition, Crohn's  *     A. Fib/RVR.  New Dx.  This was not noted at PMD visit of 12/14 when HR was 65. As of my exam HR was back in NSR in 60s.     *     ESRD. TTS HD started 07/2019.  Will go to HD today.     PLAN:     *   Proceed with planned transfusion of 1 PRBC.  Carb mod/renal diet. Continue prednisone 20/day.   Restart Anusol suppositories. Diet as tolerated. ?  Need to repeat endoscopic studies?[name]  10/02/2019, 12:59 PM Phone [phone]   Attending physician's note   I have taken an interval history, reviewed the chart and examined the patient. I agree with the Advanced Practitioner's note, impression and recommendations. Have reviewed CT September 14, 2019.  I have also discussed with patient's son in detail.   Recurrent GI bleed  (requiring multiple blood transfusions) d/t SB lesion/sticture. CT is not very typical for Crohn's stricture but possible. Neg EGD with SB Bx, colonoscopy with TI intubation (4 cm) and bx 09/17/2019.  No response to steroids.  D/d lymphoma, metastatic recurrent breast CA, SB neoplasm. Weight loss (over 35lb)since June 2020. ESRD on HD Failure to thrive  Plan: -PET/CT in a.m. (have not ordered yet).  If it cannot be done as an inpt, will get CTE. -Transfuse to Hb> 8.  Trend CBC. -May need capsule endo (preceded by patency capsule).  High risk for retention -if continues to bleed. -For now we will continue prednisone.    Carmell Austria, MD Velora Heckler Fabienne Bruns [phone].

## 2019-10-02 NOTE — H&P (Signed)
History and Physical    Samantha Mckenzie:[id number] DOB: [birth year] DOA: 10/02/2019  PCP: Colon Branch, MD (Confirm with patient/family/NH records and if not entered, this has to be entered at [facility] point of entry) Patient coming from: Home  I have personally briefly reviewed patient's old medical records in [place]  Chief Complaint: Rectal bleeding  HPI: Samantha Mckenzie is a 83 y.o. female with medical history significant of medical history significant of depression, breast cancer status post left mastectomy/chemotherapy/radiation therapy, anemia of chronic disease, Crohn's ileitis, coronary artery disease, end-stage renal disease-recently started on hemodialysis 08/2019, TTS, GERD presents to emergency department after repeated rectal bleeding and feeling dizzy for 1 week.  She denies any abdominal pain, has almost once a day rectal bleeding, with blood coating stool with clots.  She had a colonoscopy on 06/06/2019 which showed terminal ileum stricture and diverticulosis in left colon-biopsy came back suspicious for Crohn's disease and was started on slow tapering dosage of prednisone since, now she is on 10 mg daily.  ED Course: Patient was found to have rapid A. Fib and lab work showed hemoglobin drop to last admission.  Transfusion started in the ED.  Review of Systems: As per HPI otherwise 10 point review of systems negative.    Past Medical History:  Diagnosis Date  . Allergic rhinitis   . Anemia   . Breast CA ([place])    surgery, chemo, XRT; had peripheral neuropathy (imbalance at times) after chemo  . Cellulitis LEFT arm recurrent 12/08/2011  . Chronic renal disease, stage IV (HCC)    Dr. Moshe Cipro  . Depression   . Fatigue   . GERD (gastroesophageal reflux disease)    gastritis, EGD 02/2007  . Gout   . Hyperlipidemia   . Hypertension   . Macular degeneration 2011  . Normal cardiac stress test 12/2014  . Osteoarthritis   . Renal insufficiency   . Syncope  09/12/2019    Past Surgical History:  Procedure Laterality Date  . AV FISTULA PLACEMENT Right 08/10/2019   Procedure: ARTERIOVENOUS (AV) FISTULA CREATION VERSUS GRAFT PLACEMENT;  Surgeon: Angelia Mould, MD;  Location: [place];  Service: Vascular;  Laterality: Right;  . BIOPSY  06/06/2019   Procedure: BIOPSY;  Surgeon: Ladene Artist, MD;  Location: [facility];  Service: Endoscopy;;  . BIOPSY  09/17/2019   Procedure: BIOPSY;  Surgeon: Irving Copas., MD;  Location: [facility] ENDOSCOPY;  Service: Gastroenterology;;  . cataracts bilaterally  1 -2012  . COLONOSCOPY WITH PROPOFOL N/A 06/06/2019   Procedure: COLONOSCOPY WITH PROPOFOL;  Surgeon: Ladene Artist, MD;  Location: [facility] ENDOSCOPY;  Service: Endoscopy;  Laterality: N/A;  . COLONOSCOPY WITH PROPOFOL N/A 09/17/2019   Procedure: COLONOSCOPY WITH PROPOFOL;  Surgeon: Rush Landmark Telford Nab., MD;  Location: [place];  Service: Gastroenterology;  Laterality: N/A;  . ESOPHAGOGASTRODUODENOSCOPY (EGD) WITH PROPOFOL N/A 09/17/2019   Procedure: ESOPHAGOGASTRODUODENOSCOPY (EGD) WITH PROPOFOL;  Surgeon: Rush Landmark Telford Nab., MD;  Location: [place];  Service: Gastroenterology;  Laterality: N/A;  . FLEXIBLE SIGMOIDOSCOPY N/A 09/15/2019   Procedure: FLEXIBLE SIGMOIDOSCOPY;  Surgeon: Yetta Flock, MD;  Location: [place];  Service: Gastroenterology;  Laterality: N/A;  . IR FLUORO GUIDE CV LINE RIGHT  08/03/2019  . IR US GUIDE VASC ACCESS RIGHT  08/03/2019  . LUNG BIOPSY  1999   neg  . MASTECTOMY Left 1999   and lymphnodes   . POLYPECTOMY  09/17/2019   Procedure: POLYPECTOMY;  Surgeon: Mansouraty, Telford Nab., MD;  Location:  [facility] ENDOSCOPY;  Service: Gastroenterology;;     reports that she has never smoked. She has never used smokeless tobacco. She reports previous alcohol use. She reports that she does not use drugs.  Allergies  Allergen Reactions  . Codeine Nausea And Vomiting  . Morphine Nausea And Vomiting  .  Oxycodone-Acetaminophen Nausea And Vomiting  . Sulfonamide Derivatives Nausea And Vomiting  . Tramadol Hcl Nausea And Vomiting    Family History  Problem Relation Age of Onset  . Breast cancer Other        ? of   . Heart attack Father 49  . Diabetes Sister        2  . Hypertension Sister   . Colon cancer Neg Hx   . Stomach cancer Neg Hx      Prior to Admission medications   Medication Sig Start Date End Date Taking? Authorizing Provider  acetaminophen (TYLENOL) 325 MG tablet Take 2 tablets (650 mg total) by mouth every 6 (six) hours as needed for mild pain, fever or headache (or Fever >/= 101). 04/12/19  Yes Emokpae, Courage, MD  allopurinol (ZYLOPRIM) 100 MG tablet Take 1 tablet (100 mg total) by mouth daily. 06/29/19  Yes Lassen, Arlo C, PA-C  atorvastatin (LIPITOR) 20 MG tablet Take 1 tablet (20 mg total) by mouth daily. 06/29/19  Yes Lassen, Arlo C, PA-C  Calcium Carb-Cholecalciferol (CALTRATE 600+D3 PO) Take 1 tablet by mouth daily.    Yes [provider]  carvedilol (COREG) 6.25 MG tablet Take 1 tablet (6.25 mg total) by mouth 2 (two) times daily with a meal. 09/10/19 10/10/19 Yes Paz, Alda Berthold, MD  Cholecalciferol (VITAMIN D3) 1000 UNITS CAPS Take 1 capsule by mouth daily.    Yes [provider]  Darbepoetin Alfa (ARANESP) 100 MCG/0.5ML SOSY injection Inject 0.5 mLs (100 mcg total) into the vein every Saturday with hemodialysis. 08/11/19  Yes Kyle, Tyrone A, DO  dicyclomine (BENTYL) 10 MG capsule Take 2 capsules (20 mg total) by mouth 4 (four) times daily -  before meals and at bedtime. Patient taking differently: Take 20 mg by mouth 4 (four) times daily -  before meals and at bedtime. Taking twice daily 06/29/19  Yes Oscar La, Arlo C, PA-C  dronabinol (MARINOL) 2.5 MG capsule Take 1 capsule (2.5 mg total) by mouth 2 (two) times daily before a meal. 09/10/19  Yes Paz, Jacqulyn Bath E, MD  escitalopram (LEXAPRO) 10 MG tablet Take 2 tablets (20 mg total) by mouth daily. 07/06/19   Yes Paz, Alda Berthold, MD  LASIX 40 MG tablet Take 40 mg by mouth 2 (two) times daily. 08/23/19  Yes [provider]  loperamide (IMODIUM A-D) 2 MG tablet Take 2 mg by mouth 4 (four) times daily as needed for diarrhea or loose stools.   Yes [provider]  Multiple Vitamins-Minerals (CENTRUM SILVER PO) Take 1 tablet by mouth daily.    Yes [provider]  nitroGLYCERIN (NITROGLYN) 2 % ointment Apply 1 inch topically every 12 (twelve) hours. Apply 1 inch topically to rectum every 12 hours 09/18/19  Yes Patrecia Pour, MD  ondansetron (ZOFRAN) 4 MG tablet Take 1 tablet (4 mg total) by mouth 4 (four) times daily. Patient taking differently: Take 4 mg by mouth 4 (four) times daily. Taking twice daily 09/10/19  Yes Paz, Alda Berthold, MD  pantoprazole (PROTONIX) 40 MG tablet Take 1 tablet (40 mg total) by mouth daily before breakfast. 07/24/19  Yes Colon Branch, MD  predniSONE (DELTASONE) 10  MG tablet Take 4 tablets (40 mg total) by mouth daily with breakfast for 14 days, THEN 3 tablets (30 mg total) daily with breakfast for 14 days, THEN 2 tablets (20 mg total) daily with breakfast for 26 days. 09/18/19 11/11/19 Yes Patrecia Pour, MD  vitamin B-12 (CYANOCOBALAMIN) 1000 MCG tablet Take 1,000 mcg by mouth daily.     Yes [provider]  vitamin C (ASCORBIC ACID) 500 MG tablet Take 500 mg by mouth daily.   Yes [provider]  hydrALAZINE (APRESOLINE) 10 MG tablet Take 1 tablet (10 mg total) by mouth every 8 (eight) hours. 08/10/19 09/09/19  Jonnie Finner, DO    Physical Exam: Vitals:   10/02/19 1500 10/02/19 1530 10/02/19 1531 10/02/19 1555  BP: (!) 107/41 (!) 107/32 (!) 107/32 (!) 105/39  Pulse: 63 63 66 62  Resp: 10 (!) 9 14 14   Temp:   (!) 97.5 F (36.4 C) (!) 97.5 F (36.4 C)  TempSrc:   Oral Oral  SpO2: 98% 100%      Constitutional: NAD, calm, comfortable Vitals:   10/02/19 1500 10/02/19 1530 10/02/19 1531 10/02/19 1555  BP: (!) 107/41 (!) 107/32 (!) 107/32  (!) 105/39  Pulse: 63 63 66 62  Resp: 10 (!) 9 14 14   Temp:   (!) 97.5 F (36.4 C) (!) 97.5 F (36.4 C)  TempSrc:   Oral Oral  SpO2: 98% 100%     Eyes: PERRL, lids and conjunctivae normal ENMT: Mucous membranes are moist. Posterior pharynx clear of any exudate or lesions.Normal dentition.  Neck: normal, supple, no masses, no thyromegaly Respiratory: clear to auscultation bilaterally, no wheezing, no crackles. Normal respiratory effort. No accessory muscle use.  Cardiovascular: Regular rate and rhythm, no murmurs / rubs / gallops. No extremity edema. 2+ pedal pulses. No carotid bruits.  Abdomen: no tenderness, no masses palpated. No hepatosplenomegaly. Bowel sounds positive.  Musculoskeletal: no clubbing / cyanosis. No joint deformity upper and lower extremities. Good ROM, no contractures. Normal muscle tone.  Skin: no rashes, lesions, ulcers. No induration Neurologic: CN 2-12 grossly intact. Sensation intact, DTR normal. Strength 5/5 in all 4.  Psychiatric: Normal judgment and insight. Alert and oriented x 3. Normal mood.     Labs on Admission: I have personally reviewed following labs and imaging studies  CBC: Recent Labs  Lab 10/02/19 1052  WBC 10.5  HGB 7.1*  HCT 23.9*  MCV 108.6*  PLT 846   Basic Metabolic Panel: Recent Labs  Lab 10/02/19 1052  NA 139  K 3.5  CL 99  CO2 27  GLUCOSE 107*  BUN 56*  CREATININE 6.08*  CALCIUM 7.2*   GFR: CrCl cannot be calculated (Unknown ideal weight.). Liver Function Tests: No results for input(s): AST, ALT, ALKPHOS, BILITOT, PROT, ALBUMIN in the last 168 hours. No results for input(s): LIPASE, AMYLASE in the last 168 hours. No results for input(s): AMMONIA in the last 168 hours. Coagulation Profile: No results for input(s): INR, PROTIME in the last 168 hours. Cardiac Enzymes: No results for input(s): CKTOTAL, CKMB, CKMBINDEX, TROPONINI in the last 168 hours. BNP (last 3 results) No results for input(s): PROBNP in the last  8760 hours. HbA1C: No results for input(s): HGBA1C in the last 72 hours. CBG: Recent Labs  Lab 10/02/19 1051  GLUCAP 104*   Lipid Profile: No results for input(s): CHOL, HDL, LDLCALC, TRIG, CHOLHDL, LDLDIRECT in the last 72 hours. Thyroid Function Tests: No results for input(s): TSH, T4TOTAL, FREET4, T3FREE, THYROIDAB in the  last 72 hours. Anemia Panel: No results for input(s): VITAMINB12, FOLATE, FERRITIN, TIBC, IRON, RETICCTPCT in the last 72 hours. Urine analysis:    Component Value Date/Time   COLORURINE YELLOW 08/02/2019 [place] 08/02/2019 2254   LABSPEC 1.010 08/02/2019 2254   PHURINE 5.0 08/02/2019 2254   GLUCOSEU 50 (A) 08/02/2019 2254   HGBUR NEGATIVE 08/02/2019 2254   BILIRUBINUR NEGATIVE 08/02/2019 2254   KETONESUR NEGATIVE 08/02/2019 2254   PROTEINUR NEGATIVE 08/02/2019 2254   UROBILINOGEN 0.2 12/29/2014 1400   NITRITE NEGATIVE 08/02/2019 2254   LEUKOCYTESUR NEGATIVE 08/02/2019 2254    Radiological Exams on Admission: DG Chest Portable 1 View  Result Date: 10/02/2019 CLINICAL DATA:  Dyspnea, weakness EXAM: PORTABLE CHEST 1 VIEW COMPARISON:  09/11/2019 chest radiograph. FINDINGS: Right internal jugular central venous catheter terminates at cavoatrial junction. Surgical clips in the left greater than right axilla, unchanged. Stable cardiomediastinal silhouette with mild cardiomegaly. No pneumothorax. Possible trace bilateral pleural effusions. No pulmonary edema. No acute consolidative airspace disease. Stable minimal scarring versus atelectasis in the mid to lower left lung. IMPRESSION: 1. Mild cardiomegaly without pulmonary edema. 2. Possible trace bilateral pleural effusions. 3. Stable minimal scarring versus atelectasis in the mid to lower left lung. No acute pulmonary disease. Electronically Signed   By: Ilona Sorrel M.D.   On: 10/02/2019 11:23    EKG: Independently reviewed.  A. fib with nonspecific ST-T changes  Assessment/Plan Active  Problems:   GI bleed   Lower GI bleed  Acute on chronic anemia, secondary to lower GI bleed likely Crohn's flareup.  Cussed with GI PA Gribbin,, and continue home dosage of steroid now, patient is to get blood transfusion repeat H&H tomorrow.  Need Remicade?  For to GI.  Paroxysmal A. Fib, single episode resolved in the ED, discontinue Cardizem drip, poor candidate for any anticoagulation.  Once H&H is stabilized consider aspirin.  ESRD, renal consult appreciated, plan for HD tomorrow.  Chronic diastolic CHF, symptoms signs of decompensation, continue Lasix with parameters.  Depression, continue SSRI.  Gout, allopurinol.  HyperLipidemia, on statin      DVT prophylaxis: SCD Code Status: DNR Family Communication: None at bedside. Disposition Plan: TBD Consults called: Gribbin from GI. Admission status: Tele admit   Lequita Halt MD [facility] Pager [phone]  If 7PM-7AM, please contact night-coverage [email or web address] Password [facility]  10/02/2019, 4:31 PM

## 2019-10-03 ENCOUNTER — Other Ambulatory Visit: Payer: Self-pay | Admitting: *Deleted

## 2019-10-03 ENCOUNTER — Telehealth: Payer: Self-pay

## 2019-10-03 DIAGNOSIS — N186 End stage renal disease: Secondary | ICD-10-CM | POA: Diagnosis not present

## 2019-10-03 DIAGNOSIS — E876 Hypokalemia: Secondary | ICD-10-CM | POA: Diagnosis not present

## 2019-10-03 DIAGNOSIS — K50011 Crohn's disease of small intestine with rectal bleeding: Secondary | ICD-10-CM | POA: Diagnosis not present

## 2019-10-03 DIAGNOSIS — I120 Hypertensive chronic kidney disease with stage 5 chronic kidney disease or end stage renal disease: Secondary | ICD-10-CM | POA: Diagnosis not present

## 2019-10-03 DIAGNOSIS — F329 Major depressive disorder, single episode, unspecified: Secondary | ICD-10-CM | POA: Diagnosis not present

## 2019-10-03 DIAGNOSIS — Z66 Do not resuscitate: Secondary | ICD-10-CM

## 2019-10-03 DIAGNOSIS — D62 Acute posthemorrhagic anemia: Secondary | ICD-10-CM | POA: Diagnosis not present

## 2019-10-03 DIAGNOSIS — D631 Anemia in chronic kidney disease: Secondary | ICD-10-CM | POA: Diagnosis not present

## 2019-10-03 DIAGNOSIS — D649 Anemia, unspecified: Secondary | ICD-10-CM

## 2019-10-03 DIAGNOSIS — M1030 Gout due to renal impairment, unspecified site: Secondary | ICD-10-CM | POA: Diagnosis not present

## 2019-10-03 DIAGNOSIS — E440 Moderate protein-calorie malnutrition: Secondary | ICD-10-CM | POA: Diagnosis not present

## 2019-10-03 DIAGNOSIS — R55 Syncope and collapse: Secondary | ICD-10-CM | POA: Diagnosis not present

## 2019-10-03 DIAGNOSIS — Z853 Personal history of malignant neoplasm of breast: Secondary | ICD-10-CM | POA: Diagnosis not present

## 2019-10-03 DIAGNOSIS — I972 Postmastectomy lymphedema syndrome: Secondary | ICD-10-CM | POA: Diagnosis not present

## 2019-10-03 DIAGNOSIS — I4891 Unspecified atrial fibrillation: Secondary | ICD-10-CM

## 2019-10-03 LAB — BPAM RBC
Blood Product Expiration Date: 202101242359
ISSUE DATE / TIME: 202012221522
Unit Type and Rh: 6200

## 2019-10-03 LAB — BASIC METABOLIC PANEL
Anion gap: 14 (ref 5–15)
BUN: 64 mg/dL — ABNORMAL HIGH (ref 8–23)
CO2: 27 mmol/L (ref 22–32)
Calcium: 7 mg/dL — ABNORMAL LOW (ref 8.9–10.3)
Chloride: 99 mmol/L (ref 98–111)
Creatinine, Ser: 6.86 mg/dL — ABNORMAL HIGH (ref 0.44–1.00)
GFR calc Af Amer: 6 mL/min — ABNORMAL LOW (ref >60)
GFR calc non Af Amer: 5 mL/min — ABNORMAL LOW (ref >60)
Glucose, Bld: 128 mg/dL — ABNORMAL HIGH (ref 70–99)
Potassium: 4.5 mmol/L (ref 3.5–5.1)
Sodium: 140 mmol/L (ref 135–145)

## 2019-10-03 LAB — TYPE AND SCREEN
ABO/RH(D): A POS
Antibody Screen: NEGATIVE
Unit division: 0

## 2019-10-03 LAB — CBC
HCT: 27.6 % — ABNORMAL LOW (ref 36.0–46.0)
Hemoglobin: 9.4 g/dL — ABNORMAL LOW (ref 12.0–15.0)
MCH: 32.6 pg (ref 26.0–34.0)
MCHC: 34.1 g/dL (ref 30.0–36.0)
MCV: 95.8 fL (ref 80.0–100.0)
Platelets: 128 10*3/uL — ABNORMAL LOW (ref 150–400)
RBC: 2.88 MIL/uL — ABNORMAL LOW (ref 3.87–5.11)
RDW: 19.9 % — ABNORMAL HIGH (ref 11.5–15.5)
WBC: 9.3 10*3/uL (ref 4.0–10.5)
nRBC: 0 % (ref 0.0–0.2)

## 2019-10-03 LAB — TSH: TSH: 1.991 u[IU]/mL (ref 0.350–4.500)

## 2019-10-03 MED ORDER — SODIUM CHLORIDE 0.9 % IV SOLN: 125.0000 mg | INTRAVENOUS | Status: DC

## 2019-10-03 MED ORDER — SODIUM CHLORIDE 0.9 % IV SOLN
125.0000 mg | INTRAVENOUS | Status: DC
  Filled 2019-10-03: qty 10

## 2019-10-03 NOTE — Plan of Care (Signed)
  Problem: Education: Goal: Knowledge of disease and its progression will improve Outcome: Progressing   

## 2019-10-03 NOTE — Telephone Encounter (Signed)
Plan of care signed and faxed back to [facility] at [phone]. Form sent to scan.

## 2019-10-03 NOTE — Progress Notes (Signed)
PROGRESS NOTE    Samantha Mckenzie  ZDG:[id number] DOB: May 29, 1936 DOA: 10/02/2019 PCP: Colon Branch, MD   Brief Narrative:  HPI on 10/02/2019 by Dr. Wynetta Fines Samantha Mckenzie is a 83 y.o. female with medical history significant of medical history significant ofdepression, breast cancer status post left mastectomy/chemotherapy/radiation therapy, anemia of chronic disease, Crohn's ileitis, coronary artery disease, end-stage renal disease-recently started on hemodialysis 08/2019, TTS, GERD presents to emergency department after repeated rectal bleeding and feeling dizzy for 1 week.  She denies any abdominal pain, has almost once a day rectal bleeding, with blood coating stool with clots.  She had a colonoscopy on 06/06/2019 which showed terminal ileum stricture and diverticulosis in left colon-biopsy came backsuspicious for Crohn's disease and was started on slow tapering dosage of prednisone since, now she is on 10 mg daily.  Interim history Patient admitted for acute on chronic anemia.  Gastroenterology consulted and appreciated. Patient also with ESRD, nephrology consulted and following. Assessment & Plan   Acute on chronic anemia/symptomatic anemia -Thought to be due to lower GI bleed with possibility of Crohn's -Hemoglobin on admission 7.1, patient transfused 1 unit PRBC.  Hemoglobin currently 9.4 -FOBT positive -Gastroenterology consulted and appreciated -Currently on prednisone -Patient continues to complain of weakness and dizziness  Atrial fibrillation, paroxysmal -Patient was noted to have an episode in the ED, was placed on Cardizem drip however this has been discontinued -Patient is a poor candidate for any anticoagulation given anemia -Continue Coreg  ESRD -Patient dialyzes on Tuesday, Thursday, Saturday -Nephrology consulted and appreciated -Patient did miss dialysis on 10/02/2019, currently in dialysis today  Chronic diastolic heart failure -Continue volume control  with dialysis as well as Lasix  Depression -Continue Lexapro  Gout -Continue allopurinol  Hyperlipidemia -Continue statin  DVT Prophylaxis  SCDs  Code Status: DNR  Family Communication: None at bedside  Disposition Plan: Admitted. Pending further GI recommendations. Dispo TBD  Consultants Gastroenterology Nephrology  Procedures  None  Antibiotics   Anti-infectives (From admission, onward)   None      Subjective:   Samantha Mckenzie seen and examined today.  Patient seen in dialysis today.  Continues to complain of dizziness and weakness.  Denies chest pain or shortness of breath, abdominal pain, nausea or vomiting. States she is having dark stools.    Objective:   Vitals:   10/03/19 0841 10/03/19 0900 10/03/19 0937 10/03/19 1011  BP: (!) 124/27 (!) 142/27 (!) 127/26 (!) 123/28  Pulse: 66 69 70 81  Resp: 11 11 11 11   Temp:      TempSrc:      SpO2:      Weight:      Height:        Intake/Output Summary (Last 24 hours) at 10/03/2019 1023 Last data filed at 10/03/2019 5956 Gross per 24 hour  Intake 635 ml  Output 0 ml  Net 635 ml   Filed Weights   10/02/19 1816 10/02/19 2114 10/03/19 0700  Weight: 53.5 kg 55.1 kg 54.2 kg    Exam  General: Well developed, chronically ill-appearing, NAD  HEENT: NCAT, mucous membranes moist.   Cardiovascular: S1 S2 auscultated, RRR  Respiratory: Clear to auscultation bilaterally   Abdomen: Soft, nontender, nondistended, + bowel sounds  Extremities: warm dry without cyanosis clubbing or edema  Neuro: AAOx3, nonfocal  Psych: Flat   Data Reviewed: I have personally reviewed following labs and imaging studies  CBC: Recent Labs  Lab 10/02/19 1052 10/02/19 2043 10/03/19 0449  WBC  10.5  --  9.3  HGB 7.1* 9.3* 9.4*  HCT 23.9* 28.3* 27.6*  MCV 108.6*  --  95.8  PLT 165  --  295*   Basic Metabolic Panel: Recent Labs  Lab 10/02/19 1052 10/03/19 0449  NA 139 140  K 3.5 4.5  CL 99 99  CO2 27 27  GLUCOSE  107* 128*  BUN 56* 64*  CREATININE 6.08* 6.86*  CALCIUM 7.2* 7.0*   GFR: Estimated Creatinine Clearance: 5.1 mL/min (A) (by C-G formula based on SCr of 6.86 mg/dL (H)). Liver Function Tests: No results for input(s): AST, ALT, ALKPHOS, BILITOT, PROT, ALBUMIN in the last 168 hours. No results for input(s): LIPASE, AMYLASE in the last 168 hours. No results for input(s): AMMONIA in the last 168 hours. Coagulation Profile: No results for input(s): INR, PROTIME in the last 168 hours. Cardiac Enzymes: No results for input(s): CKTOTAL, CKMB, CKMBINDEX, TROPONINI in the last 168 hours. BNP (last 3 results) No results for input(s): PROBNP in the last 8760 hours. HbA1C: No results for input(s): HGBA1C in the last 72 hours. CBG: Recent Labs  Lab 10/02/19 1051  GLUCAP 104*   Lipid Profile: No results for input(s): CHOL, HDL, LDLCALC, TRIG, CHOLHDL, LDLDIRECT in the last 72 hours. Thyroid Function Tests: Recent Labs    10/03/19 0449  TSH 1.991   Anemia Panel: No results for input(s): VITAMINB12, FOLATE, FERRITIN, TIBC, IRON, RETICCTPCT in the last 72 hours. Urine analysis:    Component Value Date/Time   COLORURINE YELLOW 08/02/2019 2254   APPEARANCEUR CLEAR 08/02/2019 2254   LABSPEC 1.010 08/02/2019 2254   PHURINE 5.0 08/02/2019 2254   GLUCOSEU 50 (A) 08/02/2019 2254   HGBUR NEGATIVE 08/02/2019 2254   BILIRUBINUR NEGATIVE 08/02/2019 2254   KETONESUR NEGATIVE 08/02/2019 2254   PROTEINUR NEGATIVE 08/02/2019 2254   UROBILINOGEN 0.2 12/29/2014 1400   NITRITE NEGATIVE 08/02/2019 2254   LEUKOCYTESUR NEGATIVE 08/02/2019 2254   Sepsis Labs: @LABRCNTIP (procalcitonin:4,lacticidven:4)  ) Recent Results (from the past 240 hour(s))  SARS CORONAVIRUS 2 (TAT 6-24 HRS) Nasopharyngeal Nasopharyngeal Swab     Status: None   Collection Time: 10/02/19  1:04 PM   Specimen: Nasopharyngeal Swab  Result Value Ref Range Status   SARS Coronavirus 2 NEGATIVE NEGATIVE Final    Comment:  (NOTE) SARS-CoV-2 target nucleic acids are NOT DETECTED. The SARS-CoV-2 RNA is generally detectable in upper and lower respiratory specimens during the acute phase of infection. Negative results do not preclude SARS-CoV-2 infection, do not rule out co-infections with other pathogens, and should not be used as the sole basis for treatment or other patient management decisions. Negative results must be combined with clinical observations, patient history, and epidemiological information. The expected result is Negative. Fact Sheet for Patients: SugarRoll.be Fact Sheet for Healthcare Providers: https://www.woods-mathews.com/ This test is not yet approved or cleared by the [place] FDA and  has been authorized for detection and/or diagnosis of SARS-CoV-2 by FDA under an Emergency Use Authorization (EUA). This EUA will remain  in effect (meaning this test can be used) for the duration of the COVID-19 declaration under Section 56 4(b)(1) of the Act, 21 U.S.C. section 360bbb-3(b)(1), unless the authorization is terminated or revoked sooner. Performed at [facility], [place] [address], [place], [place] [address]       Radiology Studies: DG Chest Portable 1 View  Result Date: 10/02/2019 CLINICAL DATA:  Dyspnea, weakness EXAM: PORTABLE CHEST 1 VIEW COMPARISON:  09/11/2019 chest radiograph. FINDINGS: Right internal jugular central venous catheter terminates  at cavoatrial junction. Surgical clips in the left greater than right axilla, unchanged. Stable cardiomediastinal silhouette with mild cardiomegaly. No pneumothorax. Possible trace bilateral pleural effusions. No pulmonary edema. No acute consolidative airspace disease. Stable minimal scarring versus atelectasis in the mid to lower left lung. IMPRESSION: 1. Mild cardiomegaly without pulmonary edema. 2. Possible trace bilateral pleural effusions. 3. Stable minimal scarring versus atelectasis in  the mid to lower left lung. No acute pulmonary disease. Electronically Signed   By: Ilona Sorrel M.D.   On: 10/02/2019 11:23     Scheduled Meds: . sodium chloride   Intravenous Once  . allopurinol  100 mg Oral Daily  . atorvastatin  20 mg Oral Daily  . carvedilol  6.25 mg Oral BID WC  . Chlorhexidine Gluconate Cloth  6 each Topical Q0600  . [START ON 10/04/2019] darbepoetin (ARANESP) injection - DIALYSIS  200 mcg Intravenous Q Thu-HD  . dicyclomine  20 mg Oral BID  . diltiazem  20 mg Intravenous Once  . dronabinol  2.5 mg Oral BID AC  . escitalopram  20 mg Oral Daily  . furosemide  40 mg Oral BID  . hydrocortisone  25 mg Rectal BID  . nitroGLYCERIN  1 inch Topical Q12H  . pantoprazole  40 mg Oral QAC breakfast  . predniSONE  20 mg Oral QAC breakfast   Continuous Infusions:   LOS: 1 day   Time Spent in minutes   45 minutes  Reg Bircher D.O. on 10/03/2019 at 10:23 AM  Between 7am to 7pm - Please see pager noted on [email or web address]  After 7pm go to [email or web address]  And look for the night coverage person covering for me after hours  [facility] Office  [phone]

## 2019-10-03 NOTE — Plan of Care (Signed)
  Problem: Nutritional: Goal: Ability to make healthy dietary choices will improve Outcome: Progressing   

## 2019-10-03 NOTE — Progress Notes (Signed)
[facility] Progress Note   Dialysis Orders: TTS 4 hr AF EDW 51 2K 2.25 Ca TDC no heparin due to hx + stools - venofer 50/Mircera 225 q 2 weeks - last 12/10 has right upper AVGG - and right TDC   Assessment/Plan: 1.  Afib with RVR - in NSR this am TSH 1.99 2. Hypkalemia - Ks running 4- 4.3 at home HD unit but 3.5 on admission though up to 4.5 today - favor changing to 3 K bath at discharge and order weekly Ks 3.  ESRD - TTS -  - off schedule HD today- plan short HD Thursday to get back on schedule - hold on [facility] removal pending decision about continuing HD - [place] working fine today. 4.  Hypertension/volume  - BP stable without drops - average net UF 2.5 +/- EDW depending on gains - goal today 2.5 BP 170s 5.  Anemia  - hgb 7.1 drop  From hgb 8.8 12/10 - - has has been receiving max ESA dose of Mircera 225 x 6 weeks - with her last dose being given 12/10 - hx of heme positive stools - needs transfusion 1 unit PRBC- due for redose 12/24- hgb up to 9.4 today  6.  Metabolic bone disease -  No binders or VDRA  7.  Nutrition - poor intake protein low/supplments ordered 8. Hx Crohn's disease - on steroid taper 9. Hx GIB during recent admission - GI following 10. Depression - PCP following - current meds 11. DNR - noted on Community palliative care note/MOST completed in Epic- her dialysis unit needs to be notified of this at discharge if she does not stop dialysis  Myriam Jacobson, PA-C [place] [phone] 10/03/2019,7:56 AM  LOS: 1 day   Subjective:   No c/o today - considering stopping dialysis.   Objective Vitals:   10/02/19 2114 10/03/19 0511 10/03/19 0700 10/03/19 0739  BP: (!) 135/31 (!) 132/36 (!) 148/42 (!) 171/35  Pulse: 64 60 60 (!) 56  Resp: 12 12 14 14   Temp: 98.1 F (36.7 C) 98.2 F (36.8 C) 98.7 F (37.1 C)   TempSrc: Oral Oral Oral   SpO2: 98% 98% 100%   Weight: 55.1 kg  54.2 kg   Height:       Physical Exam goal 2.5   General: NAD on HD Heart: RRR Lungs: no rales Abdomen: soft  w/anasarca Extremities: tr - 1 + LE edema; L > R UE edema Dialysis Access:  Right AVGG Qb 400 and right TDC    Additional Objective Labs: Basic Metabolic Panel: Recent Labs  Lab 10/02/19 1052 10/03/19 0449  NA 139 140  K 3.5 4.5  CL 99 99  CO2 27 27  GLUCOSE 107* 128*  BUN 56* 64*  CREATININE 6.08* 6.86*  CALCIUM 7.2* 7.0*   Liver Function Tests: No results for input(s): AST, ALT, ALKPHOS, BILITOT, PROT, ALBUMIN in the last 168 hours. No results for input(s): LIPASE, AMYLASE in the last 168 hours. CBC: Recent Labs  Lab 10/02/19 1052 10/02/19 2043 10/03/19 0449  WBC 10.5  --  9.3  HGB 7.1* 9.3* 9.4*  HCT 23.9* 28.3* 27.6*  MCV 108.6*  --  95.8  PLT 165  --  128*   Blood Culture    Component Value Date/Time   SDES  08/02/2019 2149    BLOOD RIGHT ANTECUBITAL Performed at [facility], [place] [address], [place], [place] [address]    SDES  08/02/2019 2149  BLOOD RIGHT ANTECUBITAL Performed at [facility] [address] [place], [place] [address]    SPECREQUEST  08/02/2019 2149    BOTTLES DRAWN AEROBIC AND ANAEROBIC Blood Culture adequate volume Performed at [facility] [address] [place], [place] [address]    SPECREQUEST  08/02/2019 2149    BOTTLES DRAWN AEROBIC AND ANAEROBIC Blood Culture results may not be optimal due to an inadequate volume of blood received in culture bottles Performed at [facility], [place] [address], [place], [place] [address]    CULT  08/02/2019 2149    NO GROWTH 5 DAYS Performed at [facility] [address] [place], [place] [address]    CULT  08/02/2019 2149    NO GROWTH 5 DAYS Performed at [place] [address], [place],  [address]    REPTSTATUS 08/08/2019 FINAL 08/02/2019 2149   REPTSTATUS 08/08/2019 FINAL 08/02/2019 2149    Cardiac Enzymes: No  results for input(s): CKTOTAL, CKMB, CKMBINDEX, TROPONINI in the last 168 hours. CBG: Recent Labs  Lab 10/02/19 1051  GLUCAP 104*   Iron Studies: No results for input(s): IRON, TIBC, TRANSFERRIN, FERRITIN in the last 72 hours. Lab Results  Component Value Date   INR 1.0 09/11/2019   INR 1.0 08/07/2019   INR 1.0 08/03/2019   Studies/Results: DG Chest Portable 1 View  Result Date: 10/02/2019 CLINICAL DATA:  Dyspnea, weakness EXAM: PORTABLE CHEST 1 VIEW COMPARISON:  09/11/2019 chest radiograph. FINDINGS: Right internal jugular central venous catheter terminates at cavoatrial junction. Surgical clips in the left greater than right axilla, unchanged. Stable cardiomediastinal silhouette with mild cardiomegaly. No pneumothorax. Possible trace bilateral pleural effusions. No pulmonary edema. No acute consolidative airspace disease. Stable minimal scarring versus atelectasis in the mid to lower left lung. IMPRESSION: 1. Mild cardiomegaly without pulmonary edema. 2. Possible trace bilateral pleural effusions. 3. Stable minimal scarring versus atelectasis in the mid to lower left lung. No acute pulmonary disease. Electronically Signed   By: Ilona Sorrel M.D.   On: 10/02/2019 11:23   Medications:  . sodium chloride   Intravenous Once  . allopurinol  100 mg Oral Daily  . atorvastatin  20 mg Oral Daily  . carvedilol  6.25 mg Oral BID WC  . Chlorhexidine Gluconate Cloth  6 each Topical Q0600  . [START ON 10/04/2019] darbepoetin (ARANESP) injection - DIALYSIS  200 mcg Intravenous Q Thu-HD  . dicyclomine  20 mg Oral BID  . diltiazem  20 mg Intravenous Once  . dronabinol  2.5 mg Oral BID AC  . escitalopram  20 mg Oral Daily  . furosemide  40 mg Oral BID  . hydrocortisone  25 mg Rectal BID  . nitroGLYCERIN  1 inch Topical Q12H  . pantoprazole  40 mg Oral QAC breakfast  . predniSONE  20 mg Oral QAC breakfast

## 2019-10-03 NOTE — Evaluation (Signed)
Physical Therapy Evaluation Patient Details Name: Samantha Mckenzie MRN: [id number] DOB: 03-18-1936 Today's Date: 10/03/2019   History of Present Illness  83 yo female admitted to [facility] on 12/22 with dizziness, heme + stool, and shortness of breath due to anemia secondary to lower GIB. PMH includes depression, breast cancer s/p left mastectomy/chemo/radiation, anemia of chronic disease, chrohns ileitis, CAD, ESRD recently started on dialysis Tues Thurs Sat.  Clinical Impression   Pt presents with generalized weakness, difficulty performing bed mobility, increased time to mobilize, slight unsteadiness in standing, and decreased tolerance for activity. Pt to benefit from acute PT to address deficits. Pt ambulated hallway distance with RW with min guard assist, x1 standing rest break for LE fatigue. PT recommends pt continue with HHPT at home, with assist from husband as needed. PT to progress mobility as tolerated, and will continue to follow acutely.      Follow Up Recommendations Home health PT;Supervision for mobility/OOB    Equipment Recommendations  None recommended by PT    Recommendations for Other Services       Precautions / Restrictions Precautions Precautions: Fall Restrictions Weight Bearing Restrictions: No      Mobility  Bed Mobility Overal bed mobility: Needs Assistance Bed Mobility: Supine to Sit;Sit to Supine     Supine to sit: Min assist Sit to supine: Min assist   General bed mobility comments: min assist for light LE lift assist in and out of bed, increased time with use of bedrails to perform.  Transfers Overall transfer level: Needs assistance Equipment used: Rolling walker (2 wheeled) Transfers: Sit to/from Stand Sit to Stand: Min assist         General transfer comment: min assist for power up, increased time for pt to rise and steady. Verbal cuing for hand placement when rising, not followed.  Ambulation/Gait Ambulation/Gait assistance: Min  guard Gait Distance (Feet): 110 Feet Assistive device: Rolling walker (2 wheeled) Gait Pattern/deviations: Step-through pattern;Trunk flexed;Decreased stride length;Narrow base of support Gait velocity: decr   General Gait Details: min guard for safety, min verbal cuing for placement in RW. Pt with x1 standing rest break for ~30 seconds due to "my legs feel weak".  Stairs            Wheelchair Mobility    Modified Rankin (Stroke Patients Only)       Balance Overall balance assessment: Needs assistance Sitting-balance support: Feet supported Sitting balance-Leahy Scale: Good     Standing balance support: Bilateral upper extremity supported;During functional activity Standing balance-Leahy Scale: Fair Standing balance comment: reliant on external support from RW                             Pertinent Vitals/Pain Pain Assessment: No/denies pain    Home Living Family/patient expects to be discharged to:: Private residence Living Arrangements: Spouse/significant other Available Help at Discharge: Family;Available 24 hours/day Type of Home: House Home Access: Level entry     Home Layout: One level(husband can provide limited physical assist) Home Equipment: Walker - 2 wheels;Walker - 4 wheels      Prior Function Level of Independence: Independent with assistive device(s);Needs assistance   Gait / Transfers Assistance Needed: uses rollator to ambulate  ADL's / Homemaking Assistance Needed: pt's husband had been assisting pt getting off toilet, in/out bed and car PTA. Per last admission: pt states that she has been having difficulty getting up from shower chair and getting dressed lately, reports she does  not cook anymore        Hand Dominance   Dominant Hand: Right    Extremity/Trunk Assessment   Upper Extremity Assessment Upper Extremity Assessment: Generalized weakness(lymphedema bilateral UEs)    Lower Extremity Assessment Lower Extremity  Assessment: Generalized weakness    Cervical / Trunk Assessment Cervical / Trunk Assessment: Normal  Communication   Communication: No difficulties  Cognition Arousal/Alertness: Awake/alert Behavior During Therapy: WFL for tasks assessed/performed Overall Cognitive Status: Within Functional Limits for tasks assessed                                        General Comments General comments (skin integrity, edema, etc.): vss    Exercises     Assessment/Plan    PT Assessment Patient needs continued PT services  PT Problem List Decreased strength;Decreased mobility;Decreased activity tolerance;Decreased balance;Decreased knowledge of use of DME       PT Treatment Interventions DME instruction;Therapeutic exercise;Balance training;Gait training;Stair training;Functional mobility training;Therapeutic activities;Patient/family education    PT Goals (Current goals can be found in the Care Plan section)  Acute Rehab PT Goals Patient Stated Goal: go home to husband PT Goal Formulation: With patient Time For Goal Achievement: 10/17/19 Potential to Achieve Goals: Good    Frequency Min 3X/week   Barriers to discharge        Co-evaluation               AM-PAC PT "6 Clicks" Mobility  Outcome Measure Help needed turning from your back to your side while in a flat bed without using bedrails?: None Help needed moving from lying on your back to sitting on the side of a flat bed without using bedrails?: A Little Help needed moving to and from a bed to a chair (including a wheelchair)?: A Little Help needed standing up from a chair using your arms (e.g., wheelchair or bedside chair)?: A Little Help needed to walk in hospital room?: A Little Help needed climbing 3-5 steps with a railing? : A Little 6 Click Score: 19    End of Session Equipment Utilized During Treatment: Gait belt Activity Tolerance: Patient tolerated treatment well Patient left: in bed;with bed  alarm set;with call bell/phone within reach Nurse Communication: Mobility status PT Visit Diagnosis: Muscle weakness (generalized) (M62.81);Difficulty in walking, not elsewhere classified (R26.2);Unsteadiness on feet (R26.81)    Time: 2800-3491 PT Time Calculation (min) (ACUTE ONLY): 22 min   Charges:   PT Evaluation $PT Eval Low Complexity: 1 Low        Hristopher Missildine E, PT Acute Rehabilitation Services Pager [phone]  Office [phone]   Paquita Printy D Elonda Husky 10/03/2019, 5:01 PM

## 2019-10-03 NOTE — Patient Outreach (Signed)
[facility]) Care Management  10/03/2019  KAIJA KOVACEVIC [birth year] [id number]   Noted that member was readmitted to hospital, less than 30 days.  Hospital liaisons aware, this care manager will follow up with member pending discharge.  Valente David, [place], MSN [place] [phone]

## 2019-10-03 NOTE — Progress Notes (Addendum)
Daily Rounding Note  10/03/2019, 8:35 AM  LOS: 1 day   SUBJECTIVE:   Chief complaint:  Weakness, anemia.   Dark stools and bloody stools.  Presumed Crohns of ileum.   Feels weak, generally unwell.  No nausea, abd pain.  Tolerating solid food.  [place], dark, small BM today..  No SOB.    OBJECTIVE:         Vital signs in last 24 hours:    Temp:  [97.5 F (36.4 C)-98.7 F (37.1 C)] 98.7 F (37.1 C) (12/23 0700) Pulse Rate:  [56-90] 56 (12/23 0810) Resp:  [8-16] 11 (12/23 0810) BP: (95-171)/(31-64) 171/35 (12/23 0810) SpO2:  [96 %-100 %] 100 % (12/23 0700) Weight:  [53.5 kg-55.1 kg] 54.2 kg (12/23 0700) Last BM Date: 10/02/19 Filed Weights   10/02/19 1816 10/02/19 2114 10/03/19 0700  Weight: 53.5 kg 55.1 kg 54.2 kg   General: alert, comfortable, ashen/pale color Heart: RRR Chest: clear bil.  No sob, no cough Abdomen: soft, NT, ND.  Active BS  Extremities: no CCE Neuro/Psych:  Alert, appropriate.  Oriented x 3.  No tremor or gross deficits.    Intake/Output from previous day: 12/22 0701 - 12/23 0700 In: 635 [P.O.:320; Blood:315] Out: 0   Intake/Output this shift: No intake/output data recorded.  Lab Results: Recent Labs    10/02/19 1052 10/02/19 2043 10/03/19 0449  WBC 10.5  --  9.3  HGB 7.1* 9.3* 9.4*  HCT 23.9* 28.3* 27.6*  PLT 165  --  128*   BMET Recent Labs    10/02/19 1052 10/03/19 0449  NA 139 140  K 3.5 4.5  CL 99 99  CO2 27 27  GLUCOSE 107* 128*  BUN 56* 64*  CREATININE 6.08* 6.86*  CALCIUM 7.2* 7.0*   LFT No results for input(s): PROT, ALBUMIN, AST, ALT, ALKPHOS, BILITOT, BILIDIR, IBILI in the last 72 hours. PT/INR No results for input(s): LABPROT, INR in the last 72 hours. Hepatitis Panel No results for input(s): HEPBSAG, HCVAB, HEPAIGM, HEPBIGM in the last 72 hours.  Studies/Results: DG Chest Portable 1 View  Result Date: 10/02/2019 CLINICAL DATA:  Dyspnea, weakness  EXAM: PORTABLE CHEST 1 VIEW COMPARISON:  09/11/2019 chest radiograph. FINDINGS: Right internal jugular central venous catheter terminates at cavoatrial junction. Surgical clips in the left greater than right axilla, unchanged. Stable cardiomediastinal silhouette with mild cardiomegaly. No pneumothorax. Possible trace bilateral pleural effusions. No pulmonary edema. No acute consolidative airspace disease. Stable minimal scarring versus atelectasis in the mid to lower left lung. IMPRESSION: 1. Mild cardiomegaly without pulmonary edema. 2. Possible trace bilateral pleural effusions. 3. Stable minimal scarring versus atelectasis in the mid to lower left lung. No acute pulmonary disease. Electronically Signed   By: Ilona Sorrel M.D.   On: 10/02/2019 11:23   Scheduled Meds: . sodium chloride   Intravenous Once  . allopurinol  100 mg Oral Daily  . atorvastatin  20 mg Oral Daily  . carvedilol  6.25 mg Oral BID WC  . Chlorhexidine Gluconate Cloth  6 each Topical Q0600  . [START ON 10/04/2019] darbepoetin (ARANESP) injection - DIALYSIS  200 mcg Intravenous Q Thu-HD  . dicyclomine  20 mg Oral BID  . diltiazem  20 mg Intravenous Once  . dronabinol  2.5 mg Oral BID AC  . escitalopram  20 mg Oral Daily  . furosemide  40 mg Oral BID  . hydrocortisone  25 mg Rectal BID  . nitroGLYCERIN  1 inch Topical  Q12H  . pantoprazole  40 mg Oral QAC breakfast  . predniSONE  20 mg Oral QAC breakfast   Continuous Infusions: PRN Meds:.acetaminophen, loperamide, promethazine  ASSESMENT:   *   Acute on chronic, macrocytic anemia w anemia of chronic kidney dz and blood loss anemia.   On max ESA, venofer, daily po B12.     *   Crohn's ileitis w stricture.  Hemorrhoids.  Chronic dark stools and BPR.   On Prednisone last 4 months, current dose 20 mg/day.  Stool frequency much improved to 1 or 2/day.    *   ESRD.  Plan HD today and Thursday.  Normal schedule is TTS.    *   FTT, weight loss, anorexia w/o N/V, PCM.       *   Afib w RVR  *   Weakness.  Multifactorial.     PLAN   *   PET scans are outpt only.  CT enterography requires IV access and current int jug line does not meet needs per CT tech; IV not allowed on either UE due to AVG on right and breast CA related lymphedema on left.  CT also has restrictions of no IV contrast for 6 months after initial dx of ESRD and starting HD, but Dr Moshe Cipro ok w proceeding as pt has no hope of renal recovery.    *   ? Capsule endo but would need patency capsule study beforehand  *   Has office GI fup on 12/30.  *   Leave Prednisone at [place]  10/03/2019, 8:35 AM Phone [phone]    Attending physician's note   I have taken an interval history, reviewed the chart and examined the patient. I agree with the Advanced Practitioner's note, impression and recommendations.    Have discussed with Eber Jones (patient's son who works here in [employer] as a security guard) and Dr Ree Kida.  PET cannot be done as an inpatient.  CTE not performed due to IV access problems. She got dialysis today.  And is planned to have it tomorrow on her regular schedule.  Plan to monitor Hb/Hct and transfuse as needed. She has appt with GI as an outpatient on 10/10/2019 We will arrange above studies, starting with PET scan as an outpatient. I will get in touch with Dr. Henrene Pastor and [place] discharge in a.m. We will sign off for now.  Carmell Austria, MD [facility] Heck[facility] Fabienne Bruns [phone].

## 2019-10-03 NOTE — Consult Note (Addendum)
   [facility] Inpatient Consult   10/03/2019  Samantha Mckenzie Oct 05, 1936 [id number]   [facility] Status: Active  Patient is currently active with [facility] for chronic disease management services.  Patient has been engaged by a [place] Coordinator who is made aware of patient's hospitalization.   Our community based plan of care has focused on disease management and community resource support.    Patient admitted with Gastrointestinal Bleeding.  Plan:  Will follow up with Inpatient Transition Of Care [TOC] team member and follow progress and any additional disposition needs.   10/03/2019 Update note - 1250 Patient has been active with [facility] Palliative program and Home Health with [facility].   Of note, [facility] Care Management services does not replace or interfere with any services that are needed or arranged by inpatient [facility] care management team.  For additional questions or referrals please contact:  Natividad Brood, RN BSN [facility] Liaison  [phone] business mobile phone Toll free office [phone]  Fax number: [phone] [name]

## 2019-10-04 LAB — RENAL FUNCTION PANEL
Albumin: 1.8 g/dL — ABNORMAL LOW (ref 3.5–5.0)
Anion gap: 9 (ref 5–15)
BUN: 33 mg/dL — ABNORMAL HIGH (ref 8–23)
CO2: 28 mmol/L (ref 22–32)
Calcium: 6.8 mg/dL — ABNORMAL LOW (ref 8.9–10.3)
Chloride: 100 mmol/L (ref 98–111)
Creatinine, Ser: 3.96 mg/dL — ABNORMAL HIGH (ref 0.44–1.00)
GFR calc Af Amer: 12 mL/min — ABNORMAL LOW (ref >60)
GFR calc non Af Amer: 10 mL/min — ABNORMAL LOW (ref >60)
Glucose, Bld: 111 mg/dL — ABNORMAL HIGH (ref 70–99)
Phosphorus: 2.8 mg/dL (ref 2.5–4.6)
Potassium: 4.5 mmol/L (ref 3.5–5.1)
Sodium: 137 mmol/L (ref 135–145)

## 2019-10-04 LAB — CBC
HCT: 26.8 % — ABNORMAL LOW (ref 36.0–46.0)
Hemoglobin: 8.4 g/dL — ABNORMAL LOW (ref 12.0–15.0)
MCH: 32.2 pg (ref 26.0–34.0)
MCHC: 31.3 g/dL (ref 30.0–36.0)
MCV: 102.7 fL — ABNORMAL HIGH (ref 80.0–100.0)
Platelets: 105 10*3/uL — ABNORMAL LOW (ref 150–400)
RBC: 2.61 MIL/uL — ABNORMAL LOW (ref 3.87–5.11)
RDW: 19.5 % — ABNORMAL HIGH (ref 11.5–15.5)
WBC: 6.3 10*3/uL (ref 4.0–10.5)
nRBC: 0 % (ref 0.0–0.2)

## 2019-10-04 MED ORDER — HEPARIN SODIUM (PORCINE) 1000 UNIT/ML IJ SOLN
INTRAMUSCULAR | Status: AC
  Filled 2019-10-04: qty 4

## 2019-10-04 MED ORDER — DARBEPOETIN ALFA 200 MCG/0.4ML IJ SOSY
PREFILLED_SYRINGE | INTRAMUSCULAR | Status: AC
  Administered 2019-10-04: 200 ug via INTRAVENOUS
  Filled 2019-10-04: qty 0.4

## 2019-10-04 NOTE — Progress Notes (Signed)
I have seen and examined this patient and agree with the plan of care  Sherril Croon 10/04/2019, 1:35 PM  [facility] Progress Note   Dialysis Orders: TTS 4 hr AF EDW 51 2K 2.25 Ca TDC no heparin due to hx + stools - venofer 50/Mircera 225 q 2 weeks - last 12/10 has right upper AVGG - and right TDC   Assessment/Plan: 1.  Afib with RVR - resolved in NSR TSH 1.99 2. Hypokalemia - Ks running 4- 4.3 at home HD unit but 3.5 on admission though up to 4.5 today - favor changing to 3 K bath at discharge and order weekly Ks 3.  ESRD - TTS -  - Had HD Wed and plan short HD today to get back on schedule - [place] working fine today. 4.  Hypertension/volume  - BP stable without drops - average net UF 2.5 +/- EDW depending on gains - net UF 2.25 Wed > 5.26 - goal for EDW today 5.  Anemia  - hgb 7.1 drop  From hgb 8.8 12/10 - - has has been receiving max ESA dose of Mircera 225 x 6 weeks - with her last dose being given 12/10 - hx of heme positive stools - needs transfusion 1 unit PRBC- due for redose 12/24- hgb up to 9.4 Wed - re equilibrated to 8.4 today  6.  Metabolic bone disease -  No binders or VDRA  7.  Nutrition - poor intake protein low/supplments ordered 8. Hx Crohn's disease - on steroid taper 9. Hx GIB during recent admission - GI following - cont pred at 20 , has GI f/u 12/30 -  10. Depression - PCP following - current meds 11. DNR - noted on Community palliative care note/MOST completed in Epic- her dialysis unit needs to be notified of this at discharge   Myriam Jacobson, PA-C [place] [phone] 10/04/2019,1:35 PM  LOS: 2 days   Subjective:   No c/o today - now planning for d/c today  Objective Vitals:   10/04/19 0956 10/04/19 1150 10/04/19 1200 10/04/19 1230  BP: (!) 147/52 (!) 153/42 (!) 161/38 (!) 159/38  Pulse: 69 70 70 68  Resp:  11 11 11   Temp:  98.1 F (36.7 C)    TempSrc:  Oral    SpO2:  98%    Weight:      Height:        Physical Exam goal 2.5  General: NAD on room air Heart: RRR Lungs: no rales Abdomen: soft   Extremities: tr - 1 + LE edema; L > R UE edema Dialysis Access:  Right AVGG + bruit and right [facility]    Additional Objective Labs: Basic Metabolic Panel: Recent Labs  Lab 10/02/19 1052 10/03/19 0449 10/04/19 0502  NA 139 140 137  K 3.5 4.5 4.5  CL 99 99 100  CO2 27 27 28   GLUCOSE 107* 128* 111*  BUN 56* 64* 33*  CREATININE 6.08* 6.86* 3.96*  CALCIUM 7.2* 7.0* 6.8*  PHOS  --   --  2.8   Liver Function Tests: Recent Labs  Lab 10/04/19 0502  ALBUMIN 1.8*   No results for input(s): LIPASE, AMYLASE in the last 168 hours. CBC: Recent Labs  Lab 10/02/19 1052 10/02/19 2043 10/03/19 0449 10/04/19 0502  WBC 10.5  --  9.3 6.3  HGB 7.1* 9.3* 9.4* 8.4*  HCT 23.9* 28.3* 27.6* 26.8*  MCV 108.6*  --  95.8 102.7*  PLT 165  --  128* 105*   Blood Culture    Component Value Date/Time   SDES  08/02/2019 2149    BLOOD RIGHT ANTECUBITAL Performed at [facility] [place] [address] [place], [place] [address]    SDES  08/02/2019 2149    BLOOD RIGHT ANTECUBITAL Performed at [facility], [place] [address], [place], [place] [address]    SPECREQUEST  08/02/2019 2149    BOTTLES DRAWN AEROBIC AND ANAEROBIC Blood Culture adequate volume Performed at [name] [address], [place], [place] [address]    SPECREQUEST  08/02/2019 2149    BOTTLES DRAWN AEROBIC AND ANAEROBIC Blood Culture results may not be optimal due to an inadequate volume of blood received in culture bottles Performed at [facility], [place] [address], [place], [place] [address]    CULT  08/02/2019 2149    NO GROWTH 5 DAYS Performed at [facility] [address], [place], [place] [address]    CULT  08/02/2019 2149    NO GROWTH 5 DAYS Performed at [place] [address], [place], [place] [address]    REPTSTATUS 08/08/2019  FINAL 08/02/2019 2149   REPTSTATUS 08/08/2019 FINAL 08/02/2019 2149    Cardiac Enzymes: No results for input(s): CKTOTAL, CKMB, CKMBINDEX, TROPONINI in the last 168 hours. CBG: Recent Labs  Lab 10/02/19 1051  GLUCAP 104*   Iron Studies: No results for input(s): IRON, TIBC, TRANSFERRIN, FERRITIN in the last 72 hours. Lab Results  Component Value Date   INR 1.0 09/11/2019   INR 1.0 08/07/2019   INR 1.0 08/03/2019   Studies/Results: No results found. Medications: . ferric gluconate (FERRLECIT/NULECIT) IV    . [START ON 10/07/2019] ferric gluconate (FERRLECIT/NULECIT) IV    . [START ON 10/09/2019] ferric gluconate (FERRLECIT/NULECIT) IV     . sodium chloride   Intravenous Once  . allopurinol  100 mg Oral Daily  . atorvastatin  20 mg Oral Daily  . carvedilol  6.25 mg Oral BID WC  . Chlorhexidine Gluconate Cloth  6 each Topical Q0600  . darbepoetin (ARANESP) injection - DIALYSIS  200 mcg Intravenous Q Thu-HD  . dicyclomine  20 mg Oral BID  . diltiazem  20 mg Intravenous Once  . dronabinol  2.5 mg Oral BID AC  . escitalopram  20 mg Oral Daily  . furosemide  40 mg Oral BID  . hydrocortisone  25 mg Rectal BID  . nitroGLYCERIN  1 inch Topical Q12H  . pantoprazole  40 mg Oral QAC breakfast  . predniSONE  20 mg Oral QAC breakfast

## 2019-10-04 NOTE — Progress Notes (Addendum)
[facility] Progress Note   Dialysis Orders: TTS 4 hr AF EDW 51 2K 2.25 Ca TDC no heparin due to hx + stools - venofer 50/Mircera 225 q 2 weeks - last 12/10 has right upper AVGG - and right TDC   Assessment/Plan: 1.  Afib with RVR - resolved in NSR TSH 1.99 2. Hypokalemia - Ks running 4- 4.3 at home HD unit but 3.5 on admission though up to 4.5 today - favor changing to 3 K bath at discharge and order weekly Ks 3.  ESRD - TTS -  - Had HD Wed and plan short HD today to get back on schedule - [place] working fine today. 4.  Hypertension/volume  - BP stable without drops - average net UF 2.5 +/- EDW depending on gains - net UF 2.25 Wed > 5.26 - goal for EDW today 5.  Anemia  - hgb 7.1 drop  From hgb 8.8 12/10 - - has has been receiving max ESA dose of Mircera 225 x 6 weeks - with her last dose being given 12/10 - hx of heme positive stools - needs transfusion 1 unit PRBC- due for redose 12/24- hgb up to 9.4 Wed - re equilibrated to 8.4 today  6.  Metabolic bone disease -  No binders or VDRA  7.  Nutrition - poor intake protein low/supplments ordered 8. Hx Crohn's disease - on steroid taper 9. Hx GIB during recent admission - GI following - cont pred at 20 , has GI f/u 12/30 -  10. Depression - PCP following - current meds 11. DNR - noted on [facility] note/MOST completed in Epic- her dialysis unit needs to be notified of this at discharge   Myriam Jacobson, PA-C [place] [phone] 10/04/2019,9:30 AM  LOS: 2 days   Subjective:   No c/o today - now planning for d/c today  Objective Vitals:   10/03/19 2036 10/04/19 0521 10/04/19 0521 10/04/19 0900  BP: 136/79 (!) 151/49 (!) 151/49 (!) 118/36  Pulse: 68 65 63 75  Resp: 16 16 16 14   Temp: 98.8 F (37.1 C) 97.9 F (36.6 C) 97.9 F (36.6 C) 98 F (36.7 C)  TempSrc: Oral Oral Oral Oral  SpO2: 98% 97% 100% 100%  Weight: 54 kg     Height:       Physical Exam goal 2.5  General: NAD  on room air Heart: RRR Lungs: no rales Abdomen: soft   Extremities: tr - 1 + LE edema; L > R UE edema Dialysis Access:  Right AVGG + bruit and right [facility]    Additional Objective Labs: Basic Metabolic Panel: Recent Labs  Lab 10/02/19 1052 10/03/19 0449 10/04/19 0502  NA 139 140 137  K 3.5 4.5 4.5  CL 99 99 100  CO2 27 27 28   GLUCOSE 107* 128* 111*  BUN 56* 64* 33*  CREATININE 6.08* 6.86* 3.96*  CALCIUM 7.2* 7.0* 6.8*  PHOS  --   --  2.8   Liver Function Tests: Recent Labs  Lab 10/04/19 0502  ALBUMIN 1.8*   No results for input(s): LIPASE, AMYLASE in the last 168 hours. CBC: Recent Labs  Lab 10/02/19 1052 10/02/19 2043 10/03/19 0449 10/04/19 0502  WBC 10.5  --  9.3 6.3  HGB 7.1* 9.3* 9.4* 8.4*  HCT 23.9* 28.3* 27.6* 26.8*  MCV 108.6*  --  95.8 102.7*  PLT 165  --  128* 105*   Blood Culture    Component Value Date/Time  SDES  08/02/2019 2149    BLOOD RIGHT ANTECUBITAL Performed at [facility], [place] [address], [place], [place] [address]    SDES  08/02/2019 2149    BLOOD RIGHT ANTECUBITAL Performed at [facility], [place] [address] [place], [place] [address]    SPECREQUEST  08/02/2019 2149    BOTTLES DRAWN AEROBIC AND ANAEROBIC Blood Culture adequate volume Performed at [facility] [address] [place], [place] [address]    SPECREQUEST  08/02/2019 2149    BOTTLES DRAWN AEROBIC AND ANAEROBIC Blood Culture results may not be optimal due to an inadequate volume of blood received in culture bottles Performed at [facility], [place] [address] [place], [place] [address]    CULT  08/02/2019 2149    NO GROWTH 5 DAYS Performed at [facility] [address], [place], [place] [address]    CULT  08/02/2019 2149    NO GROWTH 5 DAYS Performed at [facility] [address] [place],  [address]    REPTSTATUS 08/08/2019 FINAL 08/02/2019 2149   REPTSTATUS  08/08/2019 FINAL 08/02/2019 2149    Cardiac Enzymes: No results for input(s): CKTOTAL, CKMB, CKMBINDEX, TROPONINI in the last 168 hours. CBG: Recent Labs  Lab 10/02/19 1051  GLUCAP 104*   Iron Studies: No results for input(s): IRON, TIBC, TRANSFERRIN, FERRITIN in the last 72 hours. Lab Results  Component Value Date   INR 1.0 09/11/2019   INR 1.0 08/07/2019   INR 1.0 08/03/2019   Studies/Results: DG Chest Portable 1 View  Result Date: 10/02/2019 CLINICAL DATA:  Dyspnea, weakness EXAM: PORTABLE CHEST 1 VIEW COMPARISON:  09/11/2019 chest radiograph. FINDINGS: Right internal jugular central venous catheter terminates at cavoatrial junction. Surgical clips in the left greater than right axilla, unchanged. Stable cardiomediastinal silhouette with mild cardiomegaly. No pneumothorax. Possible trace bilateral pleural effusions. No pulmonary edema. No acute consolidative airspace disease. Stable minimal scarring versus atelectasis in the mid to lower left lung. IMPRESSION: 1. Mild cardiomegaly without pulmonary edema. 2. Possible trace bilateral pleural effusions. 3. Stable minimal scarring versus atelectasis in the mid to lower left lung. No acute pulmonary disease. Electronically Signed   By: Ilona Sorrel M.D.   On: 10/02/2019 11:23   Medications: . ferric gluconate (FERRLECIT/NULECIT) IV    . [START ON 10/07/2019] ferric gluconate (FERRLECIT/NULECIT) IV    . [START ON 10/09/2019] ferric gluconate (FERRLECIT/NULECIT) IV     . sodium chloride   Intravenous Once  . allopurinol  100 mg Oral Daily  . atorvastatin  20 mg Oral Daily  . carvedilol  6.25 mg Oral BID WC  . Chlorhexidine Gluconate Cloth  6 each Topical Q0600  . darbepoetin (ARANESP) injection - DIALYSIS  200 mcg Intravenous Q Thu-HD  . dicyclomine  20 mg Oral BID  . diltiazem  20 mg Intravenous Once  . dronabinol  2.5 mg Oral BID AC  . escitalopram  20 mg Oral Daily  . furosemide  40 mg Oral BID  . hydrocortisone  25 mg Rectal  BID  . nitroGLYCERIN  1 inch Topical Q12H  . pantoprazole  40 mg Oral QAC breakfast  . predniSONE  20 mg Oral QAC breakfast

## 2019-10-04 NOTE — Progress Notes (Signed)
DISCHARGE NOTE HOME NYAZIA CANEVARI to be discharged Home per MD order. Discussed prescriptions and follow up appointments with the patient. Prescriptions given to patient; medication list explained in detail. Patient verbalized understanding.  Skin clean, dry and intact without evidence of skin break down, no evidence of skin tears noted. IV catheter discontinued intact. Site without signs and symptoms of complications. Dressing and pressure applied. Pt denies pain at the site currently. No complaints noted.  Patient free of lines, drains, and wounds.   An After Visit Summary (AVS) was printed and given to the patient. Patient escorted via wheelchair, and discharged home via private auto.  Arlyss Repress, RN

## 2019-10-04 NOTE — Discharge Summary (Signed)
Physician Discharge Summary  Samantha Mckenzie JKK:[id number] DOB: 1936/07/22 DOA: 10/02/2019  PCP: Colon Branch, MD  Admit date: 10/02/2019 Discharge date: 10/04/2019  Time spent: 45 minutes  Recommendations for Outpatient Follow-up:  Patient will be discharged to home with home health physical thearpy.  Patient will need to follow up with primary care provider within one week of discharge.  Follow up with gastroenterology on 10/10/2019. Continue hemodialsysis as scheduled. Patient should continue medications as prescribed.  Patient should follow a heart healthy diet.   Discharge Diagnoses:  Acute on chronic anemia/symptomatic anemia Atrial fibrillation, paroxysmal ESRD Chronic diastolic heart failure Depression Gout Hyperlipidemia  Discharge Condition: stable  Diet recommendation: heart healthy  Filed Weights   10/03/19 0700 10/03/19 1146 10/03/19 2036  Weight: 54.2 kg 52.6 kg 54 kg    History of present illness:  on 10/02/2019 by Dr. Randall Hiss R Smithis a 83 y.o.femalewith medical history significant ofmedical history significant ofdepression, breast cancer status post left mastectomy/chemotherapy/radiation therapy, anemia of chronic disease, Crohn's ileitis, coronary artery disease, end-stage renal disease-recently started on hemodialysis11/2020, TTS, GERD presents to emergency department afterrepeated rectal bleeding and feeling dizzy for 1 week.She denies any abdominal pain,has almost once a day rectal bleeding,with blood coating stool with clots.  She had a colonoscopy on 06/06/2019 which showed terminal ileum stricture and diverticulosis in left colon-biopsy came backsuspicious for Crohn's disease and wasstarted on slow tapering dosage of prednisone since, now she is on 10 mg daily.  Hospital Course:  Acute on chronic anemia/symptomatic anemia -Thought to be due to lower GI bleed with possibility of Crohn's -Hemoglobin on admission 7.1, patient  transfused 1 unit PRBC.  Hemoglobin currently 8.4 -FOBT positive -Feels dizziness and weakness are mildly improved -Gastroenterology consulted and appreciated. Discussed with Dr. Lyndel Safe, recommended outpatient PET. Could not perform CTE due to IV access issues. Patient to follow up with Dr. Henrene Pastor on 10/10/2019. -Currently on prednisone- continue on discharge  Atrial fibrillation, paroxysmal -Patient was noted to have an episode in the ED, was placed on Cardizem drip however this has been discontinued -Patient is a poor candidate for any anticoagulation given anemia -Continue Coreg  ESRD -Patient dialyzes on Tuesday, Thursday, Saturday -Nephrology consulted and appreciated -Patient did miss dialysis on 10/02/2019, however dialyzed on 12/23 and 12/24.  -Resume normal HD schedule on discharge  Chronic diastolic heart failure -Continue volume control with dialysis as well as Lasix  Depression -Continue Lexapro  Gout -Continue allopurinol  Hyperlipidemia -Continue statin  Consultants Gastroenterology Nephrology  Procedures  None  Discharge Exam: Vitals:   10/04/19 1200 10/04/19 1230  BP: (!) 161/38 (!) 159/38  Pulse: 70 68  Resp: 11 11  Temp:    SpO2:       General: Well developed, chronically ill appearing, NAD  HEENT: NCAT, mucous membranes moist.  Cardiovascular: S1 S2 auscultated, RRR  Respiratory: Clear to auscultation bilaterally   Abdomen: Soft, nontender, nondistended, + bowel sounds  Extremities: warm dry without cyanosis clubbing or edema  Neuro: AAOx3, nonfocal  Psych: Pleasant, appropriate mood and affect  Discharge Instructions Discharge Instructions    Discharge instructions   Complete by: As directed    Patient will be discharged to home with home health physical and occupational thearpy.  Patient will need to follow up with primary care provider within one week of discharge.  Follow up with gastroenterology on 10/10/2019. Continue  hemodialsysis as scheduled. Patient should continue medications as prescribed.  Patient should follow a heart healthy diet.  Allergies as of 10/04/2019      Reactions   Codeine Nausea And Vomiting   Morphine Nausea And Vomiting   Oxycodone-acetaminophen Nausea And Vomiting   Sulfonamide Derivatives Nausea And Vomiting   Tramadol Hcl Nausea And Vomiting      Medication List    STOP taking these medications   hydrALAZINE 10 MG tablet Commonly known as: APRESOLINE     TAKE these medications   acetaminophen 325 MG tablet Commonly known as: TYLENOL Take 2 tablets (650 mg total) by mouth every 6 (six) hours as needed for mild pain, fever or headache (or Fever >/= 101).   allopurinol 100 MG tablet Commonly known as: ZYLOPRIM Take 1 tablet (100 mg total) by mouth daily.   atorvastatin 20 MG tablet Commonly known as: LIPITOR Take 1 tablet (20 mg total) by mouth daily.   CALTRATE 600+D3 PO Take 1 tablet by mouth daily.   carvedilol 6.25 MG tablet Commonly known as: COREG Take 1 tablet (6.25 mg total) by mouth 2 (two) times daily with a meal.   CENTRUM SILVER PO Take 1 tablet by mouth daily.   Darbepoetin Alfa 100 MCG/0.5ML Sosy injection Commonly known as: ARANESP Inject 0.5 mLs (100 mcg total) into the vein every Saturday with hemodialysis.   dicyclomine 10 MG capsule Commonly known as: BENTYL Take 2 capsules (20 mg total) by mouth 4 (four) times daily -  before meals and at bedtime. What changed: additional instructions   dronabinol 2.5 MG capsule Commonly known as: Marinol Take 1 capsule (2.5 mg total) by mouth 2 (two) times daily before a meal.   escitalopram 10 MG tablet Commonly known as: Lexapro Take 2 tablets (20 mg total) by mouth daily.   Lasix 40 MG tablet Generic drug: furosemide Take 40 mg by mouth 2 (two) times daily.   loperamide 2 MG tablet Commonly known as: IMODIUM A-D Take 2 mg by mouth 4 (four) times daily as needed for diarrhea or loose  stools.   nitroGLYCERIN 2 % ointment Commonly known as: NITROGLYN Apply 1 inch topically every 12 (twelve) hours. Apply 1 inch topically to rectum every 12 hours   ondansetron 4 MG tablet Commonly known as: ZOFRAN Take 1 tablet (4 mg total) by mouth 4 (four) times daily. What changed: additional instructions   pantoprazole 40 MG tablet Commonly known as: PROTONIX Take 1 tablet (40 mg total) by mouth daily before breakfast.   predniSONE 10 MG tablet Commonly known as: Deltasone Take 4 tablets (40 mg total) by mouth daily with breakfast for 14 days, THEN 3 tablets (30 mg total) daily with breakfast for 14 days, THEN 2 tablets (20 mg total) daily with breakfast for 26 days. Start taking on: September 18, 2019   vitamin B-12 1000 MCG tablet Commonly known as: CYANOCOBALAMIN Take 1,000 mcg by mouth daily.   vitamin C 500 MG tablet Commonly known as: ASCORBIC ACID Take 500 mg by mouth daily.   Vitamin D3 25 MCG (1000 UT) Caps Take 1 capsule by mouth daily.      Allergies  Allergen Reactions  . Codeine Nausea And Vomiting  . Morphine Nausea And Vomiting  . Oxycodone-Acetaminophen Nausea And Vomiting  . Sulfonamide Derivatives Nausea And Vomiting  . Tramadol Hcl Nausea And Vomiting   Follow-up Information    Colon Branch, MD. Schedule an appointment as soon as possible for a visit in 1 week(s).   Specialty: Internal Medicine Why: Hospital follow up Contact information: [address]  [place] [address] [phone]        Irene Shipper, MD. Schedule an appointment as soon as possible for a visit on 10/10/2019.   Specialty: Gastroenterology Contact information: [address] [place] [address] [phone]            The results of significant diagnostics from this hospitalization (including imaging, microbiology, ancillary and laboratory) are listed below for reference.    Significant Diagnostic Studies: DG Chest 2 View  Result Date:  09/11/2019 CLINICAL DATA:  Weakness. EXAM: CHEST - 2 VIEW COMPARISON:  May 30, 2019. FINDINGS: The heart size and mediastinal contours are within normal limits. Interval placement of right internal jugular dialysis catheter with distal tip in expected position of right atrium. No pneumothorax or pleural effusion is noted. Mild bibasilar subsegmental atelectasis is noted. The visualized skeletal structures are unremarkable. IMPRESSION: Interval placement of right internal jugular dialysis catheter with distal tip in expected position of right atrium. Mild bibasilar subsegmental atelectasis is noted. Electronically Signed   By: Marijo Conception M.D.   On: 09/11/2019 15:20   CT Head Wo Contrast  Result Date: 09/11/2019 CLINICAL DATA:  Patient became dizzy and fell. Possible brief loss of consciousness. Patient hit the back of her head. EXAM: CT HEAD WITHOUT CONTRAST TECHNIQUE: Contiguous axial images were obtained from the base of the skull through the vertex without intravenous contrast. COMPARISON:  05/30/2019 FINDINGS: Brain: No evidence of acute infarction, hemorrhage, hydrocephalus, extra-axial collection or mass lesion/mass effect. There is mild ventricular sulcal enlargement reflecting age related volume loss. Mild periventricular white matter hypoattenuation is also noted consistent with chronic microvascular ischemic change. Vascular: No hyperdense vessel or unexpected calcification. Skull: Normal. Negative for fracture or focal lesion. Sinuses/Orbits: Globes and orbits are unremarkable. The visualized sinuses and mastoid air cells are clear. Other: Small left parietal scalp hematoma. IMPRESSION: 1. No acute intracranial abnormalities. 2. Age related volume loss. Mild chronic microvascular ischemic change. 3. Small left parietal scalp hematoma. Electronically Signed   By: Lajean Manes M.D.   On: 09/11/2019 15:41   CT ABDOMEN PELVIS W CONTRAST  Result Date: 09/14/2019 CLINICAL DATA:  Crohn's disease,  rectal bleeding EXAM: CT ABDOMEN AND PELVIS WITH CONTRAST TECHNIQUE: Multidetector CT imaging of the abdomen and pelvis was performed using the standard protocol following bolus administration of intravenous contrast. CONTRAST:  42m OMNIPAQUE IOHEXOL 300 MG/ML  SOLN COMPARISON:  05/30/2019 FINDINGS: Lower chest: Small bilateral pleural effusions. Associated lower lobe atelectasis. Hepatobiliary: Liver is within normal limits. Gallbladder is unremarkable. No intrahepatic or extrahepatic ductal dilatation. Pancreas: Within normal limits. Spleen: Calcified splenic granulomata. Adrenals/Urinary Tract: Adrenal glands are within normal limits. Bilateral renal cysts, measuring up to 2.3 cm in the lateral right lower kidney. No hydronephrosis. Bladder is underdistended but unremarkable. Stomach/Bowel: Stomach is within normal limits. No evidence of bowel obstruction. Terminal ileum is within normal limits (series 7/image 47). However, a loop of mid/distal ileum in the right lower quadrant demonstrates wall thickening/inflammatory changes (series 7/image 86), suggesting infectious/inflammatory enteritis, likely related to Crohn's disease given the patient's clinical history. Sigmoid diverticulosis, without evidence of diverticulitis. No rectal wall thickening/inflammatory changes. Vascular/Lymphatic: No evidence of abdominal aortic aneurysm. Atherosclerotic calcifications of the abdominal aorta and branch vessels. No suspicious abdominopelvic lymphadenopathy. Reproductive: Uterus is within normal limits. Bilateral ovaries are unremarkable. Other: Trace right pelvic ascites. No drainable fluid collection/abscess.  No free air. Musculoskeletal: Grade 1 anterolisthesis of L5 on S1. IMPRESSION: Wall thickening/inflammatory changes involving a loop of mid/distal ileum  in the right lower quadrant, suggesting infectious/inflammatory enteritis, likely related to active inflammatory Crohn's disease given the patient's clinical  history. Trace right pelvic ascites. No drainable fluid collection/abscess. No free air. Small bilateral pleural effusions with associated lower lobe atelectasis. Electronically Signed   By: Julian Hy M.D.   On: 09/14/2019 13:50   DG Chest Portable 1 View  Result Date: 10/02/2019 CLINICAL DATA:  Dyspnea, weakness EXAM: PORTABLE CHEST 1 VIEW COMPARISON:  09/11/2019 chest radiograph. FINDINGS: Right internal jugular central venous catheter terminates at cavoatrial junction. Surgical clips in the left greater than right axilla, unchanged. Stable cardiomediastinal silhouette with mild cardiomegaly. No pneumothorax. Possible trace bilateral pleural effusions. No pulmonary edema. No acute consolidative airspace disease. Stable minimal scarring versus atelectasis in the mid to lower left lung. IMPRESSION: 1. Mild cardiomegaly without pulmonary edema. 2. Possible trace bilateral pleural effusions. 3. Stable minimal scarring versus atelectasis in the mid to lower left lung. No acute pulmonary disease. Electronically Signed   By: Ilona Sorrel M.D.   On: 10/02/2019 11:23   VAS [place] UPPER EXTREMITY VENOUS DUPLEX  Result Date: 09/15/2019 UPPER VENOUS STUDY  Indications: Swelling Limitations: Edema and poor ultrasound/tissue interface. Comparison Study: Prior LUEV study done 08/04/19 is on file for comparison Performing Technologist: Sharion Dove RVS  Examination Guidelines: A complete evaluation includes B-mode imaging, spectral Doppler, color Doppler, and power Doppler as needed of all accessible portions of each vessel. Bilateral testing is considered an integral part of a complete examination. Limited examinations for reoccurring indications may be performed as noted.  Right Findings: +----------+------------+---------+-----------+----------+-------+ RIGHT     CompressiblePhasicitySpontaneousPropertiesSummary +----------+------------+---------+-----------+----------+-------+ Subclavian                Yes       Yes                      +----------+------------+---------+-----------+----------+-------+  Left Findings: +----------+------------+---------+-----------+----------+--------------+ LEFT      CompressiblePhasicitySpontaneousProperties   Summary     +----------+------------+---------+-----------+----------+--------------+ IJV           Full       Yes       Yes                             +----------+------------+---------+-----------+----------+--------------+ Subclavian               Yes       Yes                             +----------+------------+---------+-----------+----------+--------------+ Axillary                 Yes       Yes                             +----------+------------+---------+-----------+----------+--------------+ Brachial      Full       Yes       Yes                             +----------+------------+---------+-----------+----------+--------------+ Radial        Full                                                 +----------+------------+---------+-----------+----------+--------------+  Ulnar                                               Not visualized +----------+------------+---------+-----------+----------+--------------+ Cephalic      Full                                                 +----------+------------+---------+-----------+----------+--------------+ Basilic                                             Not visualized +----------+------------+---------+-----------+----------+--------------+ Interstitial fluid noted throughout  Summary:  Right: No evidence of thrombosis in the subclavian.  Left: No evidence of deep vein thrombosis in the upper extremity. However, unable to visualize the Ulnar and basilic veins. No evidence of superficial vein thrombosis in the upper extremity. This was a limited study. No change since prior study done 08/04/19.  *See table(s) above for measurements and observations.   Diagnosing physician: Deitra Mayo MD Electronically signed by Deitra Mayo MD on 09/15/2019 at 11:09:17 PM.    Final     Microbiology: Recent Results (from the past 240 hour(s))  SARS CORONAVIRUS 2 (TAT 6-24 HRS) Nasopharyngeal Nasopharyngeal Swab     Status: None   Collection Time: 10/02/19  1:04 PM   Specimen: Nasopharyngeal Swab  Result Value Ref Range Status   SARS Coronavirus 2 NEGATIVE NEGATIVE Final    Comment: (NOTE) SARS-CoV-2 target nucleic acids are NOT DETECTED. The SARS-CoV-2 RNA is generally detectable in upper and lower respiratory specimens during the acute phase of infection. Negative results do not preclude SARS-CoV-2 infection, do not rule out co-infections with other pathogens, and should not be used as the sole basis for treatment or other patient management decisions. Negative results must be combined with clinical observations, patient history, and epidemiological information. The expected result is Negative. Fact Sheet for Patients: SugarRoll.be Fact Sheet for Healthcare Providers: https://www.woods-mathews.com/ This test is not yet approved or cleared by the [place] FDA and  has been authorized for detection and/or diagnosis of SARS-CoV-2 by FDA under an Emergency Use Authorization (EUA). This EUA will remain  in effect (meaning this test can be used) for the duration of the COVID-19 declaration under Section 56 4(b)(1) of the Act, 21 U.S.C. section 360bbb-3(b)(1), unless the authorization is terminated or revoked sooner. Performed at [facility], [place] [address], [place], [place] [address]      Labs: Basic Metabolic Panel: Recent Labs  Lab 10/02/19 1052 10/03/19 0449 10/04/19 0502  NA 139 140 137  K 3.5 4.5 4.5  CL 99 99 100  CO2 27 27 28   GLUCOSE 107* 128* 111*  BUN 56* 64* 33*  CREATININE 6.08* 6.86* 3.96*  CALCIUM 7.2* 7.0* 6.8*  PHOS  --   --  2.8   Liver Function  Tests: Recent Labs  Lab 10/04/19 0502  ALBUMIN 1.8*   No results for input(s): LIPASE, AMYLASE in the last 168 hours. No results for input(s): AMMONIA in the last 168 hours. CBC: Recent Labs  Lab 10/02/19 1052 10/02/19 2043 10/03/19 0449 10/04/19 0502  WBC 10.5  --  9.3 6.3  HGB  7.1* 9.3* 9.4* 8.4*  HCT 23.9* 28.3* 27.6* 26.8*  MCV 108.6*  --  95.8 102.7*  PLT 165  --  128* 105*   Cardiac Enzymes: No results for input(s): CKTOTAL, CKMB, CKMBINDEX, TROPONINI in the last 168 hours. BNP: BNP (last 3 results) Recent Labs    04/07/19 2157 08/02/19 2149  BNP 294.1* 509.3*    ProBNP (last 3 results) No results for input(s): PROBNP in the last 8760 hours.  CBG: Recent Labs  Lab 10/02/19 1051  GLUCAP 104*       Signed:  Cristal Ford  [facility] 10/04/2019, 2:15 PM

## 2019-10-04 NOTE — Discharge Instructions (Signed)
Anemia  Anemia is a condition in which you do not have enough red blood cells or hemoglobin. Hemoglobin is a substance in red blood cells that carries oxygen. When you do not have enough red blood cells or hemoglobin (are anemic), your body cannot get enough oxygen and your organs may not work properly. As a result, you may feel very tired or have other problems. What are the causes? Common causes of anemia include:  Excessive bleeding. Anemia can be caused by excessive bleeding inside or outside the body, including bleeding from the intestine or from periods in women.  Poor nutrition.  Long-lasting (chronic) kidney, thyroid, and liver disease.  Bone marrow disorders.  Cancer and treatments for cancer.  HIV (human immunodeficiency virus) and AIDS (acquired immunodeficiency syndrome).  Treatments for HIV and AIDS.  Spleen problems.  Blood disorders.  Infections, medicines, and autoimmune disorders that destroy red blood cells. What are the signs or symptoms? Symptoms of this condition include:  Minor weakness.  Dizziness.  Headache.  Feeling heartbeats that are irregular or faster than normal (palpitations).  Shortness of breath, especially with exercise.  Paleness.  Cold sensitivity.  Indigestion.  Nausea.  Difficulty sleeping.  Difficulty concentrating. Symptoms may occur suddenly or develop slowly. If your anemia is mild, you may not have symptoms. How is this diagnosed? This condition is diagnosed based on:  Blood tests.  Your medical history.  A physical exam.  Bone marrow biopsy. Your health care provider may also check your stool (feces) for blood and may do additional testing to look for the cause of your bleeding. You may also have other tests, including:  Imaging tests, such as a CT scan or MRI.  Endoscopy.  Colonoscopy. How is this treated? Treatment for this condition depends on the cause. If you continue to lose a lot of blood, you may  need to be treated at a hospital. Treatment may include:  Taking supplements of iron, vitamin S31, or folic acid.  Taking a hormone medicine (erythropoietin) that can help to stimulate red blood cell growth.  Having a blood transfusion. This may be needed if you lose a lot of blood.  Making changes to your diet.  Having surgery to remove your spleen. Follow these instructions at home:  Take over-the-counter and prescription medicines only as told by your health care provider.  Take supplements only as told by your health care provider.  Follow any diet instructions that you were given.  Keep all follow-up visits as told by your health care provider. This is important. Contact a health care provider if:  You develop new bleeding anywhere in the body. Get help right away if:  You are very weak.  You are short of breath.  You have pain in your abdomen or chest.  You are dizzy or feel faint.  You have trouble concentrating.  You have bloody or black, tarry stools.  You vomit repeatedly or you vomit up blood. Summary  Anemia is a condition in which you do not have enough red blood cells or enough of a substance in your red blood cells that carries oxygen (hemoglobin).  Symptoms may occur suddenly or develop slowly.  If your anemia is mild, you may not have symptoms.  This condition is diagnosed with blood tests as well as a medical history and physical exam. Other tests may be needed.  Treatment for this condition depends on the cause of the anemia. This information is not intended to replace advice given to you by  your health care provider. Make sure you discuss any questions you have with your health care provider. Document Released: 11/04/2004 Document Revised: 09/09/2017 Document Reviewed: 10/29/2016 Elsevier Patient Education  2020 [employer].

## 2019-10-04 NOTE — Progress Notes (Signed)
[facility] Progress Note   Dialysis Orders: TTS 4 hr AF EDW 51 2K 2.25 Ca TDC no heparin due to hx + stools - venofer 50/Mircera 225 q 2 weeks - last 12/10 has right upper AVGG - and right TDC   Assessment/Plan: 1.  Afib with RVR - resolved in NSR TSH 1.99 2. Hypokalemia - Ks running 4- 4.3 at home HD unit but 3.5 on admission though up to 4.5 today - favor changing to 3 K bath at discharge and order weekly Ks 3.  ESRD - TTS -  - Had HD Wed and plan short HD today to get back on schedule - [place] working fine today. 4.  Hypertension/volume  - BP stable without drops - average net UF 2.5 +/- EDW depending on gains - net UF 2.25 Wed > 5.26 - goal for EDW today 5.  Anemia  - hgb 7.1 drop  From hgb 8.8 12/10 - - has has been receiving max ESA dose of Mircera 225 x 6 weeks - with her last dose being given 12/10 - hx of heme positive stools - needs transfusion 1 unit PRBC- due for redose 12/24- hgb up to 9.4 Wed - re equilibrated to 8.4 today  6.  Metabolic bone disease -  No binders or VDRA  7.  Nutrition - poor intake protein low/supplments ordered 8. Hx Crohn's disease - on steroid taper 9. Hx GIB during recent admission - GI following - cont pred at 20 , has GI f/u 12/30 -  10. Depression - PCP following - current meds 11. DNR - noted on Community palliative care note/MOST completed in Epic- her dialysis unit needs to be notified of this at discharge   Myriam Jacobson, PA-C [place] [phone] 10/04/2019,1:34 PM  LOS: 2 days   Subjective:   No c/o today - now planning for d/c today  Objective Vitals:   10/04/19 0956 10/04/19 1150 10/04/19 1200 10/04/19 1230  BP: (!) 147/52 (!) 153/42 (!) 161/38 (!) 159/38  Pulse: 69 70 70 68  Resp:  11 11 11   Temp:  98.1 F (36.7 C)    TempSrc:  Oral    SpO2:  98%    Weight:      Height:       Physical Exam goal 2.5  General: NAD on room air Heart: RRR Lungs: no rales Abdomen: soft   Extremities: tr  - 1 + LE edema; L > R UE edema Dialysis Access:  Right AVGG + bruit and right [facility]    Additional Objective Labs: Basic Metabolic Panel: Recent Labs  Lab 10/02/19 1052 10/03/19 0449 10/04/19 0502  NA 139 140 137  K 3.5 4.5 4.5  CL 99 99 100  CO2 27 27 28   GLUCOSE 107* 128* 111*  BUN 56* 64* 33*  CREATININE 6.08* 6.86* 3.96*  CALCIUM 7.2* 7.0* 6.8*  PHOS  --   --  2.8   Liver Function Tests: Recent Labs  Lab 10/04/19 0502  ALBUMIN 1.8*   No results for input(s): LIPASE, AMYLASE in the last 168 hours. CBC: Recent Labs  Lab 10/02/19 1052 10/02/19 2043 10/03/19 0449 10/04/19 0502  WBC 10.5  --  9.3 6.3  HGB 7.1* 9.3* 9.4* 8.4*  HCT 23.9* 28.3* 27.6* 26.8*  MCV 108.6*  --  95.8 102.7*  PLT 165  --  128* 105*   Blood Culture    Component Value Date/Time   SDES  08/02/2019 2149    BLOOD  RIGHT ANTECUBITAL Performed at [facility] [address], [place], [place] [address]    SDES  08/02/2019 2149    BLOOD RIGHT ANTECUBITAL Performed at [facility] [place] [address], [place], [place] [address]    SPECREQUEST  08/02/2019 2149    BOTTLES DRAWN AEROBIC AND ANAEROBIC Blood Culture adequate volume Performed at [facility] [address], [place], [place] [address]    SPECREQUEST  08/02/2019 2149    BOTTLES DRAWN AEROBIC AND ANAEROBIC Blood Culture results may not be optimal due to an inadequate volume of blood received in culture bottles Performed at [facility], [place] [address] [place], [place] [address]    CULT  08/02/2019 2149    NO GROWTH 5 DAYS Performed at [facility] [address], [place], [place] [address]    CULT  08/02/2019 2149    NO GROWTH 5 DAYS Performed at [facility] [address], [place], [place] [address]    REPTSTATUS 08/08/2019 FINAL 08/02/2019 2149   REPTSTATUS 08/08/2019 FINAL 08/02/2019 2149    Cardiac Enzymes: No results for input(s):  CKTOTAL, CKMB, CKMBINDEX, TROPONINI in the last 168 hours. CBG: Recent Labs  Lab 10/02/19 1051  GLUCAP 104*   Iron Studies: No results for input(s): IRON, TIBC, TRANSFERRIN, FERRITIN in the last 72 hours. Lab Results  Component Value Date   INR 1.0 09/11/2019   INR 1.0 08/07/2019   INR 1.0 08/03/2019   Studies/Results: No results found. Medications: . ferric gluconate (FERRLECIT/NULECIT) IV    . [START ON 10/07/2019] ferric gluconate (FERRLECIT/NULECIT) IV    . [START ON 10/09/2019] ferric gluconate (FERRLECIT/NULECIT) IV     . sodium chloride   Intravenous Once  . allopurinol  100 mg Oral Daily  . atorvastatin  20 mg Oral Daily  . carvedilol  6.25 mg Oral BID WC  . Chlorhexidine Gluconate Cloth  6 each Topical Q0600  . darbepoetin (ARANESP) injection - DIALYSIS  200 mcg Intravenous Q Thu-HD  . dicyclomine  20 mg Oral BID  . diltiazem  20 mg Intravenous Once  . dronabinol  2.5 mg Oral BID AC  . escitalopram  20 mg Oral Daily  . furosemide  40 mg Oral BID  . hydrocortisone  25 mg Rectal BID  . nitroGLYCERIN  1 inch Topical Q12H  . pantoprazole  40 mg Oral QAC breakfast  . predniSONE  20 mg Oral QAC breakfast

## 2019-10-07 DIAGNOSIS — D631 Anemia in chronic kidney disease: Secondary | ICD-10-CM | POA: Diagnosis not present

## 2019-10-07 DIAGNOSIS — Z992 Dependence on renal dialysis: Secondary | ICD-10-CM | POA: Diagnosis not present

## 2019-10-07 DIAGNOSIS — D509 Iron deficiency anemia, unspecified: Secondary | ICD-10-CM | POA: Diagnosis not present

## 2019-10-07 DIAGNOSIS — N2581 Secondary hyperparathyroidism of renal origin: Secondary | ICD-10-CM | POA: Diagnosis not present

## 2019-10-07 DIAGNOSIS — N186 End stage renal disease: Secondary | ICD-10-CM | POA: Diagnosis not present

## 2019-10-07 DIAGNOSIS — E1129 Type 2 diabetes mellitus with other diabetic kidney complication: Secondary | ICD-10-CM | POA: Diagnosis not present

## 2019-10-08 ENCOUNTER — Other Ambulatory Visit: Payer: Medicare Other | Admitting: *Deleted

## 2019-10-08 ENCOUNTER — Telehealth: Payer: Self-pay | Admitting: *Deleted

## 2019-10-08 ENCOUNTER — Other Ambulatory Visit: Payer: Self-pay

## 2019-10-08 DIAGNOSIS — Z515 Encounter for palliative care: Secondary | ICD-10-CM

## 2019-10-08 NOTE — Progress Notes (Signed)
COMMUNITY PALLIATIVE CARE RN NOTE  PATIENT NAME: Samantha Mckenzie DOB: 1935/10/29 MRN: [id number]  PRIMARY CARE PROVIDER: Colon Branch, MD  RESPONSIBLE PARTY:  Acct ID - Guarantor Home Phone Work Phone Relationship Acct Type  [id number] TALEEN, PROSSER484 [phone]  Self P/F     [address], [place], [place] [address]   Covid-19 Pre-screening Negative  PLAN OF CARE and INTERVENTION:  1. ADVANCE CARE PLANNING/GOALS OF CARE: Goal is for patient to remain at home for as long as possible. She has a DNR. 2. PATIENT/CAREGIVER EDUCATION: Safe Mobility/Transfers 3. DISEASE STATUS: Met with patient and her husband in their home. Upon arrival, patient is sitting up in her recliner awake and alert, just finishing eating her lunch. She is able to answer questions appropriately, but is forgetful at times. She denies pain. She was recently hospitalized from 10/02/19 to 10/04/19 for c/o continued rectal bleeding and dizziness x 1 week. While there, she received 1 Unit of PRBCs. She did have an episode of Agib and was placed on a Cardizem drip until resolved. No changes made to her current medications. She is ambulatory using her walker. She is now able to stand independently from the commode since an elevated toilet seat is now in place. She requires assistance with dressing and bathing as needed. She does report feelings of dizziness with standing. She knows to change positions slowly. She continues with swelling to her left arm and hand. No redness or warmth noted. Edema also noted in bilateral ankles/feet. She continues to elevate extremities as much as possible. She continues with dialysis on Tues, Thurs, Sat. She reports not having much of an appetite, but does try to eat daily. She denies any nausea/vomiting. She remains on a Prednisone taper for her Crohn's disease. Currently at 30 mg per day. She continues with therapy with [facility]. She has a PT session scheduled for 3:30p this evening. She  is hoping to get stronger. Her husband is having to perform all household chores such as cooking, Office manager. I filled her pill box and will continue to do so weekly. She has an upcoming appointment on 12/30 with her PCP for follow up. Will continue to monitor.  HISTORY OF PRESENT ILLNESS: This is a 83 yo female who resides at home with her husband. Palliative care team continues to follow patient. Next visit scheduled in 1 week.    CODE STATUS: DNR  ADVANCED DIRECTIVES: Y MOST FORM: yes PPS: 50%   PHYSICAL EXAM:   LUNGS: clear to auscultation  CARDIAC: Cor RRR EXTREMITIES:  Edema to left arm/hand and bilateral feet/ankles SKIN: Exposed skin is dry and intact  NEURO: Alert and oriented x 3, forgetful, ambulatory with walker   (Duration of visit and documentation 60 minutes)    Samantha Eastern, RN BSN

## 2019-10-08 NOTE — Telephone Encounter (Signed)
Transition Care Management Follow-up Telephone Call   Date discharged? 10/04/19   How have you been since you were released from the hospital? "Doing okay"   Do you understand why you were in the hospital? yes   Do you understand the discharge instructions? yes   Where were you discharged to? Home with husband   Items Reviewed:  Medications reviewed:"I have a nurse that comes to do them. I don't know."  Allergies reviewed: yes  Dietary changes reviewed: yes  Referrals reviewed: yes   Functional Questionnaire:   Activities of Daily Living (ADLs):   She states they are independent in the following: ambulation, bathing and hygiene, feeding, continence, grooming, toileting and dressing States they require assistance with the following: uses walker. My husband helps me as needed.    Any transportation issues/concerns?: no   Any patient concerns? no   Confirmed importance and date/time of follow-up visits scheduled yes  Provider Appointment booked with  Confirmed with patient if condition begins to worsen call PCP or go to the ER.  Patient was given the office number and encouraged to call back with question or concerns.  : yes

## 2019-10-09 ENCOUNTER — Other Ambulatory Visit: Payer: Self-pay | Admitting: *Deleted

## 2019-10-09 DIAGNOSIS — D631 Anemia in chronic kidney disease: Secondary | ICD-10-CM | POA: Diagnosis not present

## 2019-10-09 DIAGNOSIS — Z992 Dependence on renal dialysis: Secondary | ICD-10-CM | POA: Diagnosis not present

## 2019-10-09 DIAGNOSIS — D509 Iron deficiency anemia, unspecified: Secondary | ICD-10-CM | POA: Diagnosis not present

## 2019-10-09 DIAGNOSIS — E1129 Type 2 diabetes mellitus with other diabetic kidney complication: Secondary | ICD-10-CM | POA: Diagnosis not present

## 2019-10-09 DIAGNOSIS — N186 End stage renal disease: Secondary | ICD-10-CM | POA: Diagnosis not present

## 2019-10-09 DIAGNOSIS — N2581 Secondary hyperparathyroidism of renal origin: Secondary | ICD-10-CM | POA: Diagnosis not present

## 2019-10-09 NOTE — Patient Outreach (Signed)
[facility]) Care Management  10/09/2019  Samantha Mckenzie 20-Nov-1935 [id number]   Noted that member was admitted to hospital 12/22-12/24 for GI bleed.  Call placed to member to follow up on discharge, husband report she is currently at dialysis.  Will follow up within the next 3-5 business days on a non-dialysis day.  Valente David, [place], MSN [place] [phone]

## 2019-10-10 ENCOUNTER — Ambulatory Visit (INDEPENDENT_AMBULATORY_CARE_PROVIDER_SITE_OTHER): Payer: Medicare Other | Admitting: Internal Medicine

## 2019-10-10 ENCOUNTER — Other Ambulatory Visit: Payer: Self-pay | Admitting: Internal Medicine

## 2019-10-10 ENCOUNTER — Encounter: Payer: Self-pay | Admitting: Physician Assistant

## 2019-10-10 ENCOUNTER — Other Ambulatory Visit (INDEPENDENT_AMBULATORY_CARE_PROVIDER_SITE_OTHER): Payer: Medicare Other

## 2019-10-10 ENCOUNTER — Other Ambulatory Visit: Payer: Self-pay

## 2019-10-10 ENCOUNTER — Ambulatory Visit (INDEPENDENT_AMBULATORY_CARE_PROVIDER_SITE_OTHER): Payer: Medicare Other | Admitting: Physician Assistant

## 2019-10-10 VITALS — BP 122/52 | HR 62 | Temp 98.4°F | Ht 62.0 in | Wt 120.0 lb

## 2019-10-10 DIAGNOSIS — N9989 Other postprocedural complications and disorders of genitourinary system: Secondary | ICD-10-CM

## 2019-10-10 DIAGNOSIS — F329 Major depressive disorder, single episode, unspecified: Secondary | ICD-10-CM | POA: Diagnosis not present

## 2019-10-10 DIAGNOSIS — I10 Essential (primary) hypertension: Secondary | ICD-10-CM

## 2019-10-10 DIAGNOSIS — D649 Anemia, unspecified: Secondary | ICD-10-CM | POA: Diagnosis not present

## 2019-10-10 DIAGNOSIS — R935 Abnormal findings on diagnostic imaging of other abdominal regions, including retroperitoneum: Secondary | ICD-10-CM

## 2019-10-10 DIAGNOSIS — R634 Abnormal weight loss: Secondary | ICD-10-CM

## 2019-10-10 DIAGNOSIS — N189 Chronic kidney disease, unspecified: Secondary | ICD-10-CM | POA: Diagnosis not present

## 2019-10-10 DIAGNOSIS — D631 Anemia in chronic kidney disease: Secondary | ICD-10-CM

## 2019-10-10 DIAGNOSIS — F32A Depression, unspecified: Secondary | ICD-10-CM

## 2019-10-10 LAB — BASIC METABOLIC PANEL
BUN: 29 mg/dL — ABNORMAL HIGH (ref 6–23)
CO2: 33 mEq/L — ABNORMAL HIGH (ref 19–32)
Calcium: 7.2 mg/dL — ABNORMAL LOW (ref 8.4–10.5)
Chloride: 98 mEq/L (ref 96–112)
Creatinine, Ser: 3.33 mg/dL — ABNORMAL HIGH (ref 0.40–1.20)
GFR: 13.27 mL/min — CL (ref >60.00)
Glucose, Bld: 258 mg/dL — ABNORMAL HIGH (ref 70–99)
Potassium: 3.7 mEq/L (ref 3.5–5.1)
Sodium: 142 mEq/L (ref 135–145)

## 2019-10-10 LAB — CBC WITH DIFFERENTIAL/PLATELET
Basophils Absolute: 0 10*3/uL (ref 0.0–0.1)
Basophils Relative: 0.1 % (ref 0.0–3.0)
Eosinophils Absolute: 0 10*3/uL (ref 0.0–0.7)
Eosinophils Relative: 0.1 % (ref 0.0–5.0)
HCT: 27.9 % — ABNORMAL LOW (ref 36.0–46.0)
Hemoglobin: 8.8 g/dL — ABNORMAL LOW (ref 12.0–15.0)
Lymphocytes Relative: 5.8 % — ABNORMAL LOW (ref 12.0–46.0)
Lymphs Abs: 0.2 10*3/uL — ABNORMAL LOW (ref 0.7–4.0)
MCHC: 31.5 g/dL (ref 30.0–36.0)
MCV: 103.1 fl — ABNORMAL HIGH (ref 78.0–100.0)
Monocytes Absolute: 0.1 10*3/uL (ref 0.1–1.0)
Monocytes Relative: 2.5 % — ABNORMAL LOW (ref 3.0–12.0)
Neutro Abs: 3.6 10*3/uL (ref 1.4–7.7)
Neutrophils Relative %: 91.5 % — ABNORMAL HIGH (ref 43.0–77.0)
Platelets: 99 10*3/uL — ABNORMAL LOW (ref 150.0–400.0)
RBC: 2.71 Mil/uL — ABNORMAL LOW (ref 3.87–5.11)
RDW: 19.9 % — ABNORMAL HIGH (ref 11.5–15.5)
WBC: 3.9 10*3/uL — ABNORMAL LOW (ref 4.0–10.5)

## 2019-10-10 LAB — SEDIMENTATION RATE: Sed Rate: 12 mm/hr (ref 0–30)

## 2019-10-10 NOTE — Progress Notes (Signed)
Subjective:    Patient ID: Samantha Mckenzie, female    DOB: April 01, 1936, 83 y.o.   MRN: [id number]  DOS:  10/10/2019 Type of visit - description: Attempted  to make this a video visit, due to technical difficulties from the patient side it was not possible  thus we proceeded with a Virtual Visit via Telephone    I connected with above mentioned patient  by telephone and verified that I am speaking with the correct person using two identifiers.  THIS ENCOUNTER IS A VIRTUAL VISIT DUE TO COVID-19 - PATIENT WAS NOT SEEN IN THE OFFICE. PATIENT HAS CONSENTED TO VIRTUAL VISIT / TELEMEDICINE VISIT   Location of patient: home  Location of provider: office  I discussed the limitations, risks, security and privacy concerns of performing an evaluation and management service by telephone and the availability of in person appointments. I also discussed with the patient that there may be a patient responsible charge related to this service. The patient expressed understanding and agreed to proceed.   History of Present Illness:   Hospital follow-up  Patient was admitted to the hospital discharged 10/04/2019. Diagnosis was acute on chronic anemia with symptoms. She was evaluated by the GI team, anemia felt to be due to lower GI bleeding possibly from Crohn's. Hemoglobin upon discharge after 1 PRBC was 8.4. Symptoms including dizziness and weakness improved slightly.   Had an episode of atrial fibrillation treated with Cardizem drip, subsequently she continue with Coreg, she is not a candidate for anticoagulation.   Review of Systems Since she left the hospital she is back home. Continues to feel extremely weak, unable to get up without assistance, "my legs are like noodles".  Denies fever chills No nausea or vomiting Stools are formed, she does not know the color or if they are bloody. Shortness of breath is episodic and at baseline. She is doing her dialysis as a scheduled  Past Medical  History:  Diagnosis Date  . Allergic rhinitis   . Anemia   . Breast CA ([place])    surgery, chemo, XRT; had peripheral neuropathy (imbalance at times) after chemo  . Cellulitis LEFT arm recurrent 12/08/2011  . Chronic renal disease, stage IV (H[facility])    Dr. Moshe Cipro  . Depression   . Fatigue   . GERD (gastroesophageal reflux disease)    gastritis, EGD 02/2007  . Gout   . Hyperlipidemia   . Hypertension   . Macular degeneration 2011  . Normal cardiac stress test 12/2014  . Osteoarthritis   . Renal insufficiency   . Syncope 09/12/2019    Past Surgical History:  Procedure Laterality Date  . AV FISTULA PLACEMENT Right 08/10/2019   Procedure: ARTERIOVENOUS (AV) FISTULA CREATION VERSUS GRAFT PLACEMENT;  Surgeon: Angelia Mould, MD;  Location: [facility];  Service: Vascular;  Laterality: Right;  . BIOPSY  06/06/2019   Procedure: BIOPSY;  Surgeon: Ladene Artist, MD;  Location: [facility];  Service: Endoscopy;;  . BIOPSY  09/17/2019   Procedure: BIOPSY;  Surgeon: Irving Copas., MD;  Location: [facility] ENDOSCOPY;  Service: Gastroenterology;;  . cataracts bilaterally  1 -2012  . COLONOSCOPY WITH PROPOFOL N/A 06/06/2019   Procedure: COLONOSCOPY WITH PROPOFOL;  Surgeon: Ladene Artist, MD;  Location: [facility] ENDOSCOPY;  Service: Endoscopy;  Laterality: N/A;  . COLONOSCOPY WITH PROPOFOL N/A 09/17/2019   Procedure: COLONOSCOPY WITH PROPOFOL;  Surgeon: Rush Landmark Telford Nab., MD;  Location: [facility];  Service: Gastroenterology;  Laterality: N/A;  . ESOPHAGOGASTRODUODENOSCOPY (EGD) WITH PROPOFOL  N/A 09/17/2019   Procedure: ESOPHAGOGASTRODUODENOSCOPY (EGD) WITH PROPOFOL;  Surgeon: Rush Landmark Telford Nab., MD;  Location: [facility];  Service: Gastroenterology;  Laterality: N/A;  . FLEXIBLE SIGMOIDOSCOPY N/A 09/15/2019   Procedure: FLEXIBLE SIGMOIDOSCOPY;  Surgeon: Yetta Flock, MD;  Location: [facility];  Service: Gastroenterology;  Laterality: N/A;  . IR FLUORO GUIDE CV LINE  RIGHT  08/03/2019  . IR US GUIDE VASC ACCESS RIGHT  08/03/2019  . LUNG BIOPSY  1999   neg  . MASTECTOMY Left 1999   and lymphnodes   . POLYPECTOMY  09/17/2019   Procedure: POLYPECTOMY;  Surgeon: Mansouraty, Telford Nab., MD;  Location: [facility] ENDOSCOPY;  Service: Gastroenterology;;    Social History   Socioeconomic History  . Marital status: Married    Spouse name: Not on file  . Number of children: 2  . Years of education: Not on file  . Highest education level: Not on file  Occupational History  . Occupation: retired     Fish farm manager: RETIRED  Tobacco Use  . Smoking status: Never Smoker  . Smokeless tobacco: Never Used  Substance and Sexual Activity  . Alcohol use: Not Currently    Alcohol/week: 0.0 standard drinks    Comment: rarely  . Drug use: No  . Sexual activity: Not Currently  Other Topics Concern  . Not on file  Social History Narrative   Lives w/ husband , 2 children in [place], 2 [place]           Social Determinants of Health   Financial Resource Strain:   . Difficulty of Paying Living Expenses: Not on file  Food Insecurity:   . Worried About Charity fundraiser in the Last Year: Not on file  . Ran Out of Food in the Last Year: Not on file  Transportation Needs: No Transportation Needs  . Lack of Transportation (Medical): No  . Lack of Transportation (Non-Medical): No  Physical Activity:   . Days of Exercise per Week: Not on file  . Minutes of Exercise per Session: Not on file  Stress:   . Feeling of Stress : Not on file  Social Connections:   . Frequency of Communication with Friends and Family: Not on file  . Frequency of Social Gatherings with Friends and Family: Not on file  . Attends Religious Services: Not on file  . Active Member of Clubs or Organizations: Not on file  . Attends Archivist Meetings: Not on file  . Marital Status: Not on file  Intimate Partner Violence:   . Fear of Current or Ex-Partner: Not on file  . Emotionally Abused: Not on  file  . Physically Abused: Not on file  . Sexually Abused: Not on file      Allergies as of 10/10/2019      Reactions   Codeine Nausea And Vomiting   Morphine Nausea And Vomiting   Oxycodone-acetaminophen Nausea And Vomiting   Sulfonamide Derivatives Nausea And Vomiting   Tramadol Hcl Nausea And Vomiting      Medication List       Accurate as of October 10, 2019 11:44 AM. If you have any questions, ask your nurse or doctor.        STOP taking these medications   ondansetron 4 MG tablet Commonly known as: ZOFRAN Stopped by: Kathlene November, MD     TAKE these medications   acetaminophen 325 MG tablet Commonly known as: TYLENOL Take 2 tablets (650 mg total) by mouth every 6 (six) hours as needed  for mild pain, fever or headache (or Fever >/= 101).   allopurinol 100 MG tablet Commonly known as: ZYLOPRIM Take 1 tablet (100 mg total) by mouth daily.   atorvastatin 20 MG tablet Commonly known as: LIPITOR Take 1 tablet (20 mg total) by mouth daily.   CALTRATE 600+D3 PO Take 1 tablet by mouth daily.   carvedilol 6.25 MG tablet Commonly known as: COREG Take 1 tablet (6.25 mg total) by mouth 2 (two) times daily with a meal.   CENTRUM SILVER PO Take 1 tablet by mouth daily.   Darbepoetin Alfa 100 MCG/0.5ML Sosy injection Commonly known as: ARANESP Inject 0.5 mLs (100 mcg total) into the vein every Saturday with hemodialysis.   dicyclomine 10 MG capsule Commonly known as: BENTYL Take 2 capsules (20 mg total) by mouth 4 (four) times daily -  before meals and at bedtime. What changed: additional instructions   dronabinol 2.5 MG capsule Commonly known as: Marinol Take 1 capsule (2.5 mg total) by mouth 2 (two) times daily before a meal.   escitalopram 10 MG tablet Commonly known as: Lexapro Take 2 tablets (20 mg total) by mouth daily.   Lasix 40 MG tablet Generic drug: furosemide Take 40 mg by mouth 2 (two) times daily.   loperamide 2 MG tablet Commonly known as:  IMODIUM A-D Take 2 mg by mouth 4 (four) times daily as needed for diarrhea or loose stools.   nitroGLYCERIN 2 % ointment Commonly known as: NITROGLYN Apply 1 inch topically every 12 (twelve) hours. Apply 1 inch topically to rectum every 12 hours   pantoprazole 40 MG tablet Commonly known as: PROTONIX Take 1 tablet (40 mg total) by mouth daily before breakfast.   predniSONE 10 MG tablet Commonly known as: Deltasone Take 4 tablets (40 mg total) by mouth daily with breakfast for 14 days, THEN 3 tablets (30 mg total) daily with breakfast for 14 days, THEN 2 tablets (20 mg total) daily with breakfast for 26 days. Start taking on: September 18, 2019   vitamin B-12 1000 MCG tablet Commonly known as: CYANOCOBALAMIN Take 1,000 mcg by mouth daily.   vitamin C 500 MG tablet Commonly known as: ASCORBIC ACID Take 500 mg by mouth daily.   Vitamin D3 25 MCG (1000 UT) Caps Take 1 capsule by mouth daily.           Objective:   Physical Exam There were no vitals taken for this visit. This is a telephone virtual visit, she is alert, memory was noted to be poor, she ask about her medications several times.     Assessment     Assessment  DM- neuropathy (paresthesias, nl pinprick exam), CKD HTN Hyperlipidemia: Lipitor intolerant? CAD? States see Dr Terrence Dupont, no OV  records. (-) stress test 12-2014 CKD -- sees nephrology Gout GI: Crohn's ileitis DX 05-2019, colonoscopy GERD, nl EGD 01-2015  H/o Depression Leg pain-- RLS vs neuropathy, on gabapentin since 2013, good results H/o anemia --- no iron def , Cscope 2012, normal EGD 01-2015 Breast cancer:  --S/p Surgery (L mastectomy), chemotherapy, XRT --Peripheral neuropathy felt to be due to chemotherapy --Recurrent left arm cellulitis DJD  PLAN Acute on chronic anemia: Was admitted to the hospital, transfuse 1 PRBC, saw GI, felt to be due to Crohn's, currently on prednisone, dose unclear.  To see virtually GI today.  Hemoglobin levels  monitor at the hemodialysis center. Kidney failure: Following up with the hemodialysis center HTN: Reportedly BPs has been in the low side, the hemodialysis nurse  is advising her to take blood pressure medications as needed. Anxiety depression: Still an issue, with talk about escitalopram and compliance. Failure to thrive: The patient continues to be extremely weak physically. Her memory is also failing. She reports that her son is helping her and they are thinking about getting her private help.    I note that she was visited by the palliative care team, that day she was supposed to do PT but for some reason that never happened.  I will send a message to the palliative care team regards to helping with scheduling PT at home. RTC 4 to 6 weeks.  Virtually.      I discussed the assessment and treatment plan with the patient. The patient was provided an opportunity to ask questions and all were answered. The patient agreed with the plan and demonstrated an understanding of the instructions.   The patient was advised to call back or seek an in-person evaluation if the symptoms worsen or if the condition fails to improve as anticipated.  I provided 22 minutes of non-face-to-face time during this encounter.  Kathlene November, MD

## 2019-10-10 NOTE — Patient Instructions (Addendum)
If you are age 81 or older, your body mass index should be between 23-30. Your Body mass index is 21.95 kg/m. If this is out of the aforementioned range listed, please consider follow up with your Primary Care Provider.  If you are age 12 or younger, your body mass index should be between 19-25. Your Body mass index is 21.95 kg/m. If this is out of the aformentioned range listed, please consider follow up with your Primary Care Provider.   Your provider has requested that you go to the basement level for lab work before leaving today. Press "B" on the elevator. The lab is located at the first door on the left as you exit the elevator.  Continue with prednisone taper as instructed.  Take 30 mg daily until January 1st then decline to 74m daily for 1 month.  Use Imodium as needed Continue all medications   [facility] @10 :00. Please arrive at 9:30 and have had nothing to eat or drink 6 hours prior to your appointment.

## 2019-10-10 NOTE — Progress Notes (Signed)
Subjective:    Patient ID: Samantha Mckenzie, female    DOB: Nov 21, 1935, 83 y.o.   MRN: [id number]  HPI Samantha Mckenzie is a pleasant 83 year old female, established with Dr. Henrene Pastor with multiple comorbidities.  She has a tentative diagnosis of Crohn's ileitis made in August 2020 for which she has been on steroids.  She comes back in today for follow-up after recent hospitalization 1222 through 1220 January 11, 2019. Patient had undergone colonoscopy on June 06, 2019 with finding of a stenotic terminal ileum which could not be intubated.  Biopsies were nondiagnostic, she was started empirically on steroids and had significant improvement in symptoms. She had CT scan done September 14, 2019 due to complaints of ongoing rectal bleeding.  She was noted to have mild bilateral pleural effusions, terminal ileum noted to be within normal limits however there was a loop of mid to distal ileum in the right lower quadrant which demonstrated wall thickening and inflammatory changes suggesting infectious versus inflammatory enteritis, possibly related to Crohn's.  There is no rectal wall thickening or inflammatory changes and no evidence of diverticulitis. She was hospitalized with complaints of weakness on October 02, 2019 which had been progressive over the previous several days.  She had been noticing some dark stool.  She had actually gotten to the point where she felt too weak to get up.  She has recently initiated dialysis. Hemoglobin noted to be 7, 2 g lower than her baseline.  She was also in A. fib with RVR.  She was admitted for transfusion and further work-up.  She was seen in consultation by Dr. Lyndel Safe for GI.  It was felt she would need further evaluation of the abnormal small bowel findings.  She could not have CT enterography due to IV access issues and also concerns about contrast with recent initiation of dialysis.  Dr. Lyndel Safe felt that PET scan would be most helpful to rule out malignancy given her history of breast  cancer etc.  She is also had a significant weight loss of about 35 pounds since June 2020.  Plans were made to have her follow-up and schedule PET scan as an outpatient. She was continued on prednisone and is currently at 30 mg/day, on a very slow taper. She says today that she continues to feel very weak and has fallen at home.  She is using a walker now at home.  She did have a virtual visit with Dr. Larose Kells her PCP earlier today who is getting her set up for home physical therapy.  She has no current complaints of abdominal pain appetite is not great but she is not having any nausea or vomiting.  She denies any diarrhea.  Usually she will have a couple of bowel movements per day and will see a stain of blood in the commode with the stool.  Review of Systems Pertinent positive and negative review of systems were noted in the above HPI section.  All other review of systems was otherwise negative.  Outpatient Encounter Medications as of 10/10/2019  Medication Sig  . acetaminophen (TYLENOL) 325 MG tablet Take 2 tablets (650 mg total) by mouth every 6 (six) hours as needed for mild pain, fever or headache (or Fever >/= 101).  Marland Kitchen allopurinol (ZYLOPRIM) 100 MG tablet Take 1 tablet (100 mg total) by mouth daily.  Marland Kitchen atorvastatin (LIPITOR) 20 MG tablet Take 1 tablet (20 mg total) by mouth daily.  . Calcium Carb-Cholecalciferol (CALTRATE 600+D3 PO) Take 1 tablet by mouth daily.   Marland Kitchen  carvedilol (COREG) 6.25 MG tablet Take 1 tablet (6.25 mg total) by mouth 2 (two) times daily with a meal.  . Cholecalciferol (VITAMIN D3) 1000 UNITS CAPS Take 1 capsule by mouth daily.   . Darbepoetin Alfa (ARANESP) 100 MCG/0.5ML SOSY injection Inject 0.5 mLs (100 mcg total) into the vein every Saturday with hemodialysis.  Marland Kitchen dicyclomine (BENTYL) 10 MG capsule Take 2 capsules (20 mg total) by mouth 4 (four) times daily -  before meals and at bedtime. (Patient taking differently: Take 20 mg by mouth 4 (four) times daily -  before meals  and at bedtime. Taking twice daily)  . dronabinol (MARINOL) 2.5 MG capsule Take 1 capsule (2.5 mg total) by mouth 2 (two) times daily before a meal.  . escitalopram (LEXAPRO) 10 MG tablet Take 2 tablets (20 mg total) by mouth daily.  Marland Kitchen LASIX 40 MG tablet Take 40 mg by mouth 2 (two) times daily.  Marland Kitchen loperamide (IMODIUM A-D) 2 MG tablet Take 2 mg by mouth 4 (four) times daily as needed for diarrhea or loose stools.  . Multiple Vitamins-Minerals (CENTRUM SILVER PO) Take 1 tablet by mouth daily.   . nitroGLYCERIN (NITROGLYN) 2 % ointment Apply 1 inch topically every 12 (twelve) hours. Apply 1 inch topically to rectum every 12 hours  . pantoprazole (PROTONIX) 40 MG tablet Take 1 tablet (40 mg total) by mouth daily before breakfast.  . predniSONE (DELTASONE) 10 MG tablet Take 4 tablets (40 mg total) by mouth daily with breakfast for 14 days, THEN 3 tablets (30 mg total) daily with breakfast for 14 days, THEN 2 tablets (20 mg total) daily with breakfast for 26 days.  . vitamin B-12 (CYANOCOBALAMIN) 1000 MCG tablet Take 1,000 mcg by mouth daily.    . vitamin C (ASCORBIC ACID) 500 MG tablet Take 500 mg by mouth daily.  . [DISCONTINUED] ondansetron (ZOFRAN) 4 MG tablet Take 1 tablet (4 mg total) by mouth 4 (four) times daily. (Patient taking differently: Take 4 mg by mouth 4 (four) times daily. Taking twice daily)   No facility-administered encounter medications on file as of 10/10/2019.   Allergies  Allergen Reactions  . Codeine Nausea And Vomiting  . Morphine Nausea And Vomiting  . Oxycodone-Acetaminophen Nausea And Vomiting  . Sulfonamide Derivatives Nausea And Vomiting  . Tramadol Hcl Nausea And Vomiting   Patient Active Problem List   Diagnosis Date Noted  . Lower GI bleed 10/02/2019  . Atrial fibrillation with rapid ventricular response ([place])   . Syncope 09/15/2019  . Rectal bleeding   . Malnutrition of moderate degree 09/14/2019  . DNR (do not resuscitate) 09/13/2019  . Symptomatic anemia  09/11/2019  . ESRD (end stage renal disease) on dialysis ([place])   . GI bleed   . Cellulitis of arm, left 08/02/2019  . Acidosis   . Dementia ([place]) 06/17/2019  . Abnormal CT scan, small bowel   . Dehydration 05/31/2019  . Acute blood loss anemia   . Ileitis 05/30/2019  . Terminal ileitis with complication ([place]) 65/99/3570  . COPD (chronic obstructive pulmonary disease) ([place]) 04/12/2019  . Crohn's disease of small intestine with rectal bleeding ([place]) 04/07/2019  . Chronic diastolic CHF (congestive heart failure) ([place]) 04/07/2019  . Hypokalemia 04/07/2019  . Hypocalcemia 04/07/2019  . Fall 04/07/2019  . Occult blood positive stool 04/07/2019  . Adjustment disorder with depressed mood   . Generalized abdominal pain   . Dysphasia 03/21/2019  . CAP (community acquired pneumonia) 03/20/2019  . Nausea vomiting and diarrhea 03/20/2019  .  Acute respiratory failure with hypoxia ([place]) 03/20/2019  . Acute renal failure superimposed on stage 4 chronic kidney disease ([place]) 03/20/2019  . Lactic acidosis 03/20/2019  . Diabetic peripheral neuropathy associated with type 2 diabetes mellitus ([place]) 03/20/2019  . Hypomagnesemia 06/21/2018  . Generalized weakness 06/20/2018  . PCP NOTES >>> 07/11/2015  . Sepsis ([place]) 04/19/2013  . Depression   . VITAMIN B12 DEFICIENCY 04/01/2008  . GERD (gastroesophageal reflux disease) 12/19/2007  . NEOP, MALIGNANT, FEMALE BREAST NOS 07/07/2007  . Osteoarthritis 07/07/2007  . Diabetes mellitus type 2 with complications ([place]) 67/59/1638  . HLD (hyperlipidemia) 02/28/2007  . Gout 02/28/2007  . Macrocytic anemia 02/28/2007  . Essential hypertension 02/28/2007   Social History   Socioeconomic History  . Marital status: Married    Spouse name: Not on file  . Number of children: 2  . Years of education: Not on file  . Highest education level: Not on file  Occupational History  . Occupation: retired     Fish farm manager: RETIRED  Tobacco Use  . Smoking status: Never  Smoker  . Smokeless tobacco: Never Used  Substance and Sexual Activity  . Alcohol use: Not Currently    Alcohol/week: 0.0 standard drinks    Comment: rarely  . Drug use: No  . Sexual activity: Not Currently  Other Topics Concern  . Not on file  Social History Narrative   Lives w/ husband , 2 children in [place], 2 [place]           Social Determinants of Health   Financial Resource Strain:   . Difficulty of Paying Living Expenses: Not on file  Food Insecurity:   . Worried About Charity fundraiser in the Last Year: Not on file  . Ran Out of Food in the Last Year: Not on file  Transportation Needs: No Transportation Needs  . Lack of Transportation (Medical): No  . Lack of Transportation (Non-Medical): No  Physical Activity:   . Days of Exercise per Week: Not on file  . Minutes of Exercise per Session: Not on file  Stress:   . Feeling of Stress : Not on file  Social Connections:   . Frequency of Communication with Friends and Family: Not on file  . Frequency of Social Gatherings with Friends and Family: Not on file  . Attends Religious Services: Not on file  . Active Member of Clubs or Organizations: Not on file  . Attends Archivist Meetings: Not on file  . Marital Status: Not on file  Intimate Partner Violence:   . Fear of Current or Ex-Partner: Not on file  . Emotionally Abused: Not on file  . Physically Abused: Not on file  . Sexually Abused: Not on file    Samantha Mckenzie's family history includes Breast cancer in an other family member; Diabetes in her sister; Heart attack (age of onset: 59) in her father; Hypertension in her sister.      Objective:    Vitals:   10/10/19 1405  BP: (!) 122/52  Pulse: 62  Temp: 98.4 F (36.9 C)    Physical Exam Well-developed chronically ill-appearing frail elderly female in a wheelchair, in no acute distress.  Pleasant accompanied by her husband  Weight, 120 BMI 21.9  HEENT; nontraumatic normocephalic, EOMI, PE RR LA, sclera  anicteric. Oropharynx; not examined/mask/Covid Neck; supple, no JVD Cardiovascular; regular rate and rhythm with S1-S2, no murmur rub or gallop Pulmonary; Clear bilaterally Abdomen; soft, nontender, nondistended, no palpable mass or hepatosplenomegaly, bowel sounds are  active Rectal; not done today Skin; benign exam, no jaundice rash or appreciable lesions Extremities; no clubbing cyanosis , lymphedema right upper extremity/chronic Neuro/Psych; alert and oriented x4, grossly nonfocal mood and affect appropriate       Assessment & Plan:   #66 83 year old female with continued severe weakness with multiple comorbid health issues including end-stage renal disease for which she recently initiated dialysis, acute on chronic anemia, congestive heart failure, COPD, atrial fibrillation, adult onset diabetes mellitus, and prior history of breast cancer. Patient with tentative diagnosis of Crohn's ileitis made in August 2020.  She has been on prednisone over the past 4 months and has had improvement in symptoms but continues to have blood noted in her stool. Colonoscopy 8/26 had shown significant stenosis of the TI with inability to intubate TI. Interestingly recent CT not show any abnormality of the terminal ileum but did show abnormal loops of mid to distal ileum with wall thickening and inflammatory changes. Rule out Crohn's , rule out malignancy  #2 end-stage renal disease on dialysis #3 acute on chronic anemia-patient required blood transfusion x1 during hospitalization last week for hemoglobin of 7  Plan; CBC today, be met, sed rate, May need outpatient transfusion for hemoglobin less than 7. We will schedule for PET scan Continue prednisone 30 mg p.o. daily through the end of this week then decrease to 20 mg p.o. daily x1 month. Patient will need office follow-up with Dr. Henrene Pastor or myself in 3 to 4 weeks.  Samantha Mckenzie Genia Harold PA-C 10/10/2019   Cc: Colon Branch, MD

## 2019-10-10 NOTE — Patient Outreach (Signed)
[facility]) Care Management  10/10/2019  Samantha Mckenzie July 28, 1936 [id number]   Social Work referral received from [facility], [place], on 09/26/19.  "Patient lives with husband but states they are unable to care for each other, both are having frequent falls and need increased support in the home. Reports she has VA benefits and interested in Aid and Attendance. She is also active with palliative care team. Although she was very coherent with me, she does have some memory issues. There is a DPR on file for her son, [name]. She did say it was ok to speak with him as well."  Patient admitted to hospital 10/02/19-10/04/19.  Patient now receiving palliative services through [place].  Left voicemail message for ACP Social Worker [place] today.  Per [facility] workflow, plan is to collaborate with Ms. Duffy and likely close [place] Work case unless additional assistance is needed.  Awaiting return call from Ms. Duffy.  Ronn Melena, BSW Social Worker [phone]

## 2019-10-11 DIAGNOSIS — N2581 Secondary hyperparathyroidism of renal origin: Secondary | ICD-10-CM | POA: Diagnosis not present

## 2019-10-11 DIAGNOSIS — N186 End stage renal disease: Secondary | ICD-10-CM | POA: Diagnosis not present

## 2019-10-11 DIAGNOSIS — E1129 Type 2 diabetes mellitus with other diabetic kidney complication: Secondary | ICD-10-CM | POA: Diagnosis not present

## 2019-10-11 DIAGNOSIS — D631 Anemia in chronic kidney disease: Secondary | ICD-10-CM | POA: Diagnosis not present

## 2019-10-11 DIAGNOSIS — Z992 Dependence on renal dialysis: Secondary | ICD-10-CM | POA: Diagnosis not present

## 2019-10-11 DIAGNOSIS — D509 Iron deficiency anemia, unspecified: Secondary | ICD-10-CM | POA: Diagnosis not present

## 2019-10-11 NOTE — Progress Notes (Signed)
Reviewed

## 2019-10-12 DIAGNOSIS — N186 End stage renal disease: Secondary | ICD-10-CM | POA: Diagnosis not present

## 2019-10-12 DIAGNOSIS — E1129 Type 2 diabetes mellitus with other diabetic kidney complication: Secondary | ICD-10-CM | POA: Diagnosis not present

## 2019-10-12 DIAGNOSIS — Z992 Dependence on renal dialysis: Secondary | ICD-10-CM | POA: Diagnosis not present

## 2019-10-13 NOTE — Assessment & Plan Note (Signed)
Acute on chronic anemia: Was admitted to the hospital, transfuse 1 PRBC, saw GI, felt to be due to Crohn's, currently on prednisone, dose unclear.  To see virtually GI today.  Hemoglobin levels monitor at the hemodialysis center. Kidney failure: Following up with the hemodialysis center HTN: Reportedly BPs has been in the low side, the hemodialysis nurse is advising her to take blood pressure medications as needed. Anxiety depression: Still an issue, with talk about escitalopram and compliance. Failure to thrive: The patient continues to be extremely weak physically. Her memory is also failing. She reports that her son is helping her and they are thinking about getting her private help.    I note that she was visited by the palliative care team, that day she was supposed to do PT but for some reason that never happened.  I will send a message to the palliative care team regards to helping with scheduling PT at home. RTC 4 to 6 weeks.  Virtually.

## 2019-10-14 DIAGNOSIS — Z23 Encounter for immunization: Secondary | ICD-10-CM | POA: Diagnosis not present

## 2019-10-14 DIAGNOSIS — N186 End stage renal disease: Secondary | ICD-10-CM | POA: Diagnosis not present

## 2019-10-14 DIAGNOSIS — Z992 Dependence on renal dialysis: Secondary | ICD-10-CM | POA: Diagnosis not present

## 2019-10-14 DIAGNOSIS — D631 Anemia in chronic kidney disease: Secondary | ICD-10-CM | POA: Diagnosis not present

## 2019-10-14 DIAGNOSIS — E1129 Type 2 diabetes mellitus with other diabetic kidney complication: Secondary | ICD-10-CM | POA: Diagnosis not present

## 2019-10-14 DIAGNOSIS — N2581 Secondary hyperparathyroidism of renal origin: Secondary | ICD-10-CM | POA: Diagnosis not present

## 2019-10-14 DIAGNOSIS — D509 Iron deficiency anemia, unspecified: Secondary | ICD-10-CM | POA: Diagnosis not present

## 2019-10-15 ENCOUNTER — Other Ambulatory Visit: Payer: Medicare Other | Admitting: *Deleted

## 2019-10-15 ENCOUNTER — Other Ambulatory Visit: Payer: Self-pay

## 2019-10-15 ENCOUNTER — Other Ambulatory Visit: Payer: Self-pay | Admitting: *Deleted

## 2019-10-15 DIAGNOSIS — Z515 Encounter for palliative care: Secondary | ICD-10-CM

## 2019-10-15 NOTE — Patient Outreach (Signed)
[facility]) Care Management  10/15/2019  Samantha Mckenzie Jun 03, 1936 [id number]   Outreach to patient's son regarding social work referral for assistance with Aide and Attendance through [place]. Explained reason for referral.  Informed son of unsuccessful attempts to collaborate with [facility] Social Worker, Samantha Mckenzie, prior to outreach.  Son stated that he has been in communication with Samantha Mckenzie about social work services for patient.  Son stated that he completed Aide and Attendance paperwork for patient approximately 2-3 weeks ago.  Son denied need for additional assistance at this time since paperwork has been completed and he is already working with LCSW, [name].  Multidisciplinary Case Discussion is scheduled for patient on 10/18/19.  Keeping social work case open at this time but will likely close after review on 10/18/19.   Ronn Melena, BSW Social Worker [phone]

## 2019-10-15 NOTE — Patient Outreach (Signed)
[facility]) Care Management  10/15/2019  Samantha Mckenzie 1936/07/04 [id number]   Social Work referral received from [facility], [place], on 09/26/19.  "Patient lives with husband but states they are unable to care for each other, both are having frequent falls and need increased support in the home. Reports she has VA benefits and interested in Aid and Attendance. She is also active with palliative care team. Although she was very coherent with me, she does have some memory issues. There is a DPR on file for her son, [name]. She did say it was ok to speak with him as well."  Patient admitted to hospital 10/02/19-10/04/19.  Patient now receiving palliative services through [place].  Left second voicemail message for ACP Social Worker [place] today.  Per [facility] workflow, plan is to collaborate with Ms. Duffy and likely close [place] Work case unless additional assistance is needed.  Awaiting return call from Ms. Duffy. Will outreach patient later today if no return call from Ms. Duffy.    Ronn Melena, BSW Social Worker [phone]

## 2019-10-15 NOTE — Patient Outreach (Signed)
[facility]) Care Management  10/15/2019  Samantha Mckenzie June 07, 1936 [id number]   Outreach attempt #2, successful.  Call placed to member to follow up on discharge from GI bleed.  She report she is still not feeling well, remain week and dizzy.  State she still feel as if her legs will give out when she is standing/walking, uses walker.  She did have PT ordered but state she has not seen them in a couple weeks.  Collaborated with Monishia with [facility] and she state she will contact [facility] to inquire about program.  Last visit by Monishia was today.  Member report she is still seeing blood in her stool.  Was seen by GI on 12/30, has PET Scan scheduled for next week.  Didn't need transfusion as her Hgb was greater than 7.  She will have follow up with GI specialist on 2/1 to review results.  She is concerned because the scan is scheduled for Tuesday, one of her dialysis days.  When she go to dialysis tomorrow she will discuss moving her dialysis next week from Tuesday for Monday.    Report she was told that they would start to receive help from the [place] system for increased support in the home, but she is unsure when this will start.  State her son [name] would know more information.  Provided social worker with this update, advised to contact son for further information.  Denies any urgent concerns, will follow up within the next 2 weeks.  THN CM Care Plan Problem One     Most Recent Value  Care Plan Problem One  Risk for fall related to anemia as evidenced by recent hospitalization for anemia and falls  Role Documenting the Problem One  Care Management [place] for Problem One  Active  [facility] Long Term Goal   Member will not be readmitted to hospital within the next 31 days  THN Long Term Goal Start Date  10/15/19 [Not met, date reset]  Interventions for Problem One Long Term Goal  Most recent discharge instructions reviewed with member.  Reviewed signs/symptoms  of bleeding and when to contact MD and seek emergency medical attention  [facility] CM Short Term Goal #1   Member will report follow up appointments with primary MD and GI within the next 3 weeks  THN CM Short Term Goal #1 Start Date  09/21/19  [facility] CM Short Term Goal #1 Met Date  10/15/19  THN CM Short Term Goal #2   Member will report having a plan to increase support in the home within the next 4 weeks  THN CM Short Term Goal #2 Start Date  09/21/19  [facility] CM Short Term Goal #2 Met Date  10/15/19  [facility] CM Short Term Goal #3  Member will report completing PET Scan within the next week  THN CM Short Term Goal #3 Start Date  10/15/19  Interventions for Short [place] Goal #3  Appointment for scan reviewed, provided with instructions regarding NPO status and when to report for test     Valente David, RN, MSN [place] Manager [phone]

## 2019-10-16 DIAGNOSIS — Z992 Dependence on renal dialysis: Secondary | ICD-10-CM | POA: Diagnosis not present

## 2019-10-16 DIAGNOSIS — E1129 Type 2 diabetes mellitus with other diabetic kidney complication: Secondary | ICD-10-CM | POA: Diagnosis not present

## 2019-10-16 DIAGNOSIS — D631 Anemia in chronic kidney disease: Secondary | ICD-10-CM | POA: Diagnosis not present

## 2019-10-16 DIAGNOSIS — D509 Iron deficiency anemia, unspecified: Secondary | ICD-10-CM | POA: Diagnosis not present

## 2019-10-16 DIAGNOSIS — N186 End stage renal disease: Secondary | ICD-10-CM | POA: Diagnosis not present

## 2019-10-16 DIAGNOSIS — N2581 Secondary hyperparathyroidism of renal origin: Secondary | ICD-10-CM | POA: Diagnosis not present

## 2019-10-16 NOTE — Progress Notes (Signed)
COMMUNITY PALLIATIVE CARE RN NOTE  PATIENT NAME: Samantha Mckenzie DOB: 30-Mar-1936 MRN: [id number]  PRIMARY CARE PROVIDER: Colon Branch, MD  RESPONSIBLE PARTY: Crissie Figures (husband) Acct ID - Guarantor Home Phone Work Phone Relationship Acct Type  [id number] NANCEY, [phone]  Self P/F     [address], [place], [place] [address]   Covid-19 Pre-screening Negative  PLAN OF CARE and INTERVENTION:  1. ADVANCE CARE PLANNING/GOALS OF CARE: Goal is for patient to get stronger and remain in her home as long as possible. She has a DNR. 2. PATIENT/CAREGIVER EDUCATION: Safe Mobility/Transfers 3. DISEASE STATUS: Met with patient and her husband in their home. Upon arrival, she is sitting up in her recliner awake and alert. She denies pain. She states that she is not feeling well today. Reports feelings of weakness and dizziness with standing at times. Despite her report of not feeling well, she is pleasant and engaging. She denies pain. She talks about her recent appointment with her GI Specialist and that she has a PET scan scheduled for next week. She is currently on a Prednisone taper. She is to receive 30 mg for the rest of this week, then decrease to 20 mg x 1 month. She was also seen by her PCP. Dr. Larose Kells requested that I look into why patient did not receive her PT/OT last week. I contacted [facility] home health who states that they have her status in their system as on "Hold" d/t her being in the hospital. I advised that patient is now at home as of 12/24. They state that they did not receive information that she has returned home, so they will resume her therapy. She is ambulating using her walker in her home. She says that she tries to walk around her home at least 4-5 times per day to keep the strength that she has. She reports having a fall over the weekend while in the bathroom without injury. She states that her knees became wobbly prior to falling to the floor. She is able to toilet  herself with the use of the elevated toilet seat, but does require some assistance with bathing and dressing. Family is looking into hiring someone for assistance with household chores. She continues with a GI bleed. She is noticing blood in her stool each time she has a BM. She continues on dialysis 3 days/week on Tues, Thurs, Sat. She continues with swelling in her L arm/hand, but appears slightly less today. Pedal edema noted. She continues to report not having much of an appetite, but does try to eat at least 2 meals/day. I continue to fill her pill box weekly. Will send message to PCP regarding refill needs that require authorization. Will continue to monitor.  HISTORY OF PRESENT ILLNESS:  This is a 84 yo patient who resides at home with her husband. Palliative care team continues to follow patient. Next visit scheduled for 10/22/19.   CODE STATUS: DNR ADVANCED DIRECTIVES: Y MOST FORM: yes PPS: 50%   PHYSICAL EXAM:   LUNGS: clear to auscultation  CARDIAC: Cor RRR EXTREMITIES: Left arm/hand swelling, pedal edema SKIN: Exposed skin is dry and intact  NEURO: Alert and oriented x 3, increased forgetfulness, generalized weakness, ambulates with walker   (Duration of visit and documentation 60 minutes)    Daryl Eastern, RN BSN

## 2019-10-17 ENCOUNTER — Telehealth: Payer: Self-pay | Admitting: Physician Assistant

## 2019-10-17 ENCOUNTER — Telehealth: Payer: Self-pay | Admitting: *Deleted

## 2019-10-17 DIAGNOSIS — N186 End stage renal disease: Secondary | ICD-10-CM | POA: Diagnosis not present

## 2019-10-17 DIAGNOSIS — D631 Anemia in chronic kidney disease: Secondary | ICD-10-CM | POA: Diagnosis not present

## 2019-10-17 DIAGNOSIS — K50011 Crohn's disease of small intestine with rectal bleeding: Secondary | ICD-10-CM | POA: Diagnosis not present

## 2019-10-17 DIAGNOSIS — R55 Syncope and collapse: Secondary | ICD-10-CM | POA: Diagnosis not present

## 2019-10-17 DIAGNOSIS — D62 Acute posthemorrhagic anemia: Secondary | ICD-10-CM | POA: Diagnosis not present

## 2019-10-17 DIAGNOSIS — I120 Hypertensive chronic kidney disease with stage 5 chronic kidney disease or end stage renal disease: Secondary | ICD-10-CM | POA: Diagnosis not present

## 2019-10-17 NOTE — Telephone Encounter (Signed)
For her refills do you want to use the old directions or change to what she is really taking?  The directions are different of course from what nurse is saying.  Also for the zofran she was on 37m qid #60 is that ok?

## 2019-10-17 NOTE — Telephone Encounter (Signed)
-----   Message from Colon Branch, MD sent at 10/17/2019  8:30 AM EST ----- Regarding: FW: Medication Refill Authorization Needs [name], Please refill the medications for 3 months JP ----- Message ----- From: Conan Bowens, RN Sent: 10/16/2019  11:53 PM EST To: Colon Branch, MD Subject: Medication Refill Authorization Needs          Dr. Larose Kells,  Samantha Mckenzie needs a refill on the following medications as they require authorization:   1. Bentyl- She is taking 2 capsules twice daily 2. Marinol- twice daily 3. Protonix- daily 4. Ondansetron- she is taking 1 tablet twice daily  She continues with [place] on [employer] so much,  [place]

## 2019-10-17 NOTE — Telephone Encounter (Signed)
Pt needs to r/s Pet scan because she has dialysis on the same day. Pls call her.

## 2019-10-17 NOTE — Telephone Encounter (Signed)
Spoke with the spouse and the patient. She has dialysis on Tuesdays, Thursdays and Saturdays. Rescheduled her PET at [facility] to 10/26/19. Instructed to arrive at 10:45 am. Begin fasting at 5 am the morning of 10/26/19.

## 2019-10-17 NOTE — Telephone Encounter (Signed)
Change to what she is really taking per [facility] RN note

## 2019-10-18 ENCOUNTER — Other Ambulatory Visit: Payer: Self-pay

## 2019-10-18 DIAGNOSIS — N2581 Secondary hyperparathyroidism of renal origin: Secondary | ICD-10-CM | POA: Diagnosis not present

## 2019-10-18 DIAGNOSIS — D631 Anemia in chronic kidney disease: Secondary | ICD-10-CM | POA: Diagnosis not present

## 2019-10-18 DIAGNOSIS — N186 End stage renal disease: Secondary | ICD-10-CM | POA: Diagnosis not present

## 2019-10-18 DIAGNOSIS — Z992 Dependence on renal dialysis: Secondary | ICD-10-CM | POA: Diagnosis not present

## 2019-10-18 DIAGNOSIS — E1129 Type 2 diabetes mellitus with other diabetic kidney complication: Secondary | ICD-10-CM | POA: Diagnosis not present

## 2019-10-18 DIAGNOSIS — D509 Iron deficiency anemia, unspecified: Secondary | ICD-10-CM | POA: Diagnosis not present

## 2019-10-18 MED ORDER — PANTOPRAZOLE SODIUM 40 MG PO TBEC: 40.0000 mg | DELAYED_RELEASE_TABLET | Freq: Every day | ORAL | 0 refills | Status: DC

## 2019-10-18 MED ORDER — DRONABINOL 2.5 MG PO CAPS: 2.5000 mg | ORAL_CAPSULE | Freq: Two times a day (BID) | ORAL | 1 refills | Status: DC

## 2019-10-18 MED ORDER — DICYCLOMINE HCL 10 MG PO CAPS: 20.0000 mg | ORAL_CAPSULE | Freq: Two times a day (BID) | ORAL | 1 refills | Status: AC

## 2019-10-18 MED ORDER — ONDANSETRON 4 MG PO TBDP: 4.0000 mg | ORAL_TABLET | Freq: Two times a day (BID) | ORAL | 0 refills | Status: AC | PRN

## 2019-10-18 NOTE — Telephone Encounter (Signed)
rxs sent in.

## 2019-10-18 NOTE — Patient Outreach (Signed)
[facility]) Care Management  10/18/2019  ZOII FLORER [birth year] [phone]   [facility] Social Work case being closed as patient is receiving social work support through [employer]    Ronn Melena, BSW Social Worker [phone]

## 2019-10-19 DIAGNOSIS — D62 Acute posthemorrhagic anemia: Secondary | ICD-10-CM | POA: Diagnosis not present

## 2019-10-19 DIAGNOSIS — K50011 Crohn's disease of small intestine with rectal bleeding: Secondary | ICD-10-CM | POA: Diagnosis not present

## 2019-10-19 DIAGNOSIS — N2581 Secondary hyperparathyroidism of renal origin: Secondary | ICD-10-CM | POA: Diagnosis not present

## 2019-10-19 DIAGNOSIS — N186 End stage renal disease: Secondary | ICD-10-CM | POA: Diagnosis not present

## 2019-10-19 DIAGNOSIS — R55 Syncope and collapse: Secondary | ICD-10-CM | POA: Diagnosis not present

## 2019-10-19 DIAGNOSIS — E8770 Fluid overload, unspecified: Secondary | ICD-10-CM | POA: Diagnosis not present

## 2019-10-19 DIAGNOSIS — I120 Hypertensive chronic kidney disease with stage 5 chronic kidney disease or end stage renal disease: Secondary | ICD-10-CM | POA: Diagnosis not present

## 2019-10-19 DIAGNOSIS — D631 Anemia in chronic kidney disease: Secondary | ICD-10-CM | POA: Diagnosis not present

## 2019-10-19 DIAGNOSIS — Z992 Dependence on renal dialysis: Secondary | ICD-10-CM | POA: Diagnosis not present

## 2019-10-20 DIAGNOSIS — D509 Iron deficiency anemia, unspecified: Secondary | ICD-10-CM | POA: Diagnosis not present

## 2019-10-20 DIAGNOSIS — Z992 Dependence on renal dialysis: Secondary | ICD-10-CM | POA: Diagnosis not present

## 2019-10-20 DIAGNOSIS — I972 Postmastectomy lymphedema syndrome: Secondary | ICD-10-CM | POA: Diagnosis not present

## 2019-10-20 DIAGNOSIS — K50011 Crohn's disease of small intestine with rectal bleeding: Secondary | ICD-10-CM | POA: Diagnosis not present

## 2019-10-20 DIAGNOSIS — E1129 Type 2 diabetes mellitus with other diabetic kidney complication: Secondary | ICD-10-CM | POA: Diagnosis not present

## 2019-10-20 DIAGNOSIS — D631 Anemia in chronic kidney disease: Secondary | ICD-10-CM | POA: Diagnosis not present

## 2019-10-20 DIAGNOSIS — R55 Syncope and collapse: Secondary | ICD-10-CM | POA: Diagnosis not present

## 2019-10-20 DIAGNOSIS — I120 Hypertensive chronic kidney disease with stage 5 chronic kidney disease or end stage renal disease: Secondary | ICD-10-CM | POA: Diagnosis not present

## 2019-10-20 DIAGNOSIS — N186 End stage renal disease: Secondary | ICD-10-CM | POA: Diagnosis not present

## 2019-10-20 DIAGNOSIS — D62 Acute posthemorrhagic anemia: Secondary | ICD-10-CM | POA: Diagnosis not present

## 2019-10-20 DIAGNOSIS — Z853 Personal history of malignant neoplasm of breast: Secondary | ICD-10-CM | POA: Diagnosis not present

## 2019-10-20 DIAGNOSIS — N2581 Secondary hyperparathyroidism of renal origin: Secondary | ICD-10-CM | POA: Diagnosis not present

## 2019-10-20 DIAGNOSIS — F329 Major depressive disorder, single episode, unspecified: Secondary | ICD-10-CM | POA: Diagnosis not present

## 2019-10-20 DIAGNOSIS — E876 Hypokalemia: Secondary | ICD-10-CM | POA: Diagnosis not present

## 2019-10-20 DIAGNOSIS — E440 Moderate protein-calorie malnutrition: Secondary | ICD-10-CM | POA: Diagnosis not present

## 2019-10-20 DIAGNOSIS — M1030 Gout due to renal impairment, unspecified site: Secondary | ICD-10-CM | POA: Diagnosis not present

## 2019-10-20 DIAGNOSIS — Z9012 Acquired absence of left breast and nipple: Secondary | ICD-10-CM | POA: Diagnosis not present

## 2019-10-22 ENCOUNTER — Telehealth: Payer: Self-pay | Admitting: *Deleted

## 2019-10-22 ENCOUNTER — Other Ambulatory Visit: Payer: Medicare Other | Admitting: *Deleted

## 2019-10-22 ENCOUNTER — Other Ambulatory Visit: Payer: Self-pay

## 2019-10-22 DIAGNOSIS — I871 Compression of vein: Secondary | ICD-10-CM | POA: Diagnosis not present

## 2019-10-22 DIAGNOSIS — T82868A Thrombosis of vascular prosthetic devices, implants and grafts, initial encounter: Secondary | ICD-10-CM | POA: Diagnosis not present

## 2019-10-22 DIAGNOSIS — Z992 Dependence on renal dialysis: Secondary | ICD-10-CM | POA: Diagnosis not present

## 2019-10-22 DIAGNOSIS — Z515 Encounter for palliative care: Secondary | ICD-10-CM

## 2019-10-22 DIAGNOSIS — N186 End stage renal disease: Secondary | ICD-10-CM | POA: Diagnosis not present

## 2019-10-22 NOTE — Telephone Encounter (Signed)
Patient requesting 90 days.

## 2019-10-22 NOTE — Telephone Encounter (Signed)
Contacted patient several times today on her home phone, husband's cell phone and patient's cell phone. Left voicemails, but have not received a return call as of yet. I also contacted patient's son who states that they are not currently at home, but is unsure when they are to return. I will continue to attempt to contact patient in order to fill her pill box, typically done on Mondays.

## 2019-10-23 ENCOUNTER — Ambulatory Visit (HOSPITAL_COMMUNITY): Payer: Medicare Other

## 2019-10-23 ENCOUNTER — Other Ambulatory Visit: Payer: Self-pay | Admitting: Internal Medicine

## 2019-10-23 ENCOUNTER — Other Ambulatory Visit: Payer: Self-pay

## 2019-10-23 DIAGNOSIS — D509 Iron deficiency anemia, unspecified: Secondary | ICD-10-CM | POA: Diagnosis not present

## 2019-10-23 DIAGNOSIS — E1129 Type 2 diabetes mellitus with other diabetic kidney complication: Secondary | ICD-10-CM | POA: Diagnosis not present

## 2019-10-23 DIAGNOSIS — Z992 Dependence on renal dialysis: Secondary | ICD-10-CM | POA: Diagnosis not present

## 2019-10-23 DIAGNOSIS — N2581 Secondary hyperparathyroidism of renal origin: Secondary | ICD-10-CM | POA: Diagnosis not present

## 2019-10-23 DIAGNOSIS — D631 Anemia in chronic kidney disease: Secondary | ICD-10-CM | POA: Diagnosis not present

## 2019-10-23 DIAGNOSIS — N186 End stage renal disease: Secondary | ICD-10-CM | POA: Diagnosis not present

## 2019-10-23 MED ORDER — DRONABINOL 2.5 MG PO CAPS: 2.5000 mg | ORAL_CAPSULE | Freq: Two times a day (BID) | ORAL | 3 refills | Status: AC

## 2019-10-23 NOTE — Progress Notes (Signed)
COMMUNITY PALLIATIVE CARE RN NOTE  PATIENT NAME: Samantha Mckenzie DOB: 1936/05/10 MRN: [id number]  PRIMARY CARE PROVIDER: Colon Branch, MD  RESPONSIBLE PARTY: Samantha Mckenzie (husband) Acct ID - Guarantor Home Phone Work Phone Relationship Acct Type  [id number] Samantha Mckenzie, Samantha [phone]  Self P/F     [address] [place], [place] [address]   Covid-19 Pre-screening Negative  PLAN OF CARE and INTERVENTION:  1. ADVANCE CARE PLANNING/GOALS OF CARE: Goal is for patient to get stronger and remain at home with her husband. She has a DNR and a MOST form. 2. PATIENT/CAREGIVER EDUCATION: Safe Mobility/Transfers 3. DISEASE STATUS: Met with patient and her husband in their home. Upon arrival, patient is lying in bed in her room. She states that she has been out several hours at a doctor's appointment and this made her more tired. She denies pain. She does say that she experiences some dyspnea, even with conversation, but recovers fairly quickly at rest. She continues to try and ambulate using her walker throughout her home several times per day to help build her strength up. Her husband assists with bathing and dressing as necessary, and she is able to toilet herself independently. Her husband cooks and performs household chores, as she is unable to do so. She states that her appetite is poor. She is mainly only eating one meal per day. She is taking her medications regularly as prescribed. I continue to fill her pill box weekly. She has a PET scan scheduled for 10/26/19. She continues on dialysis Tuesdays, Thursdays and Saturdays. She continues with left arm/hand edema and pedal edema. She tries and keeps both elevated as much as possible. She continues with blood noted in her stool. Occasional diarrhea controlled well with Imodium. Will continue to monitor.   HISTORY OF PRESENT ILLNESS:  This is a 84 yo female who resides at home with her husband. Palliative care team continues to follow patient. Will  continue to visit patient weekly to fill pill box.  CODE STATUS: DNR ADVANCED DIRECTIVES: Y MOST FORM: yes PPS: 50%   PHYSICAL EXAM:   LUNGS: clear to auscultation  CARDIAC: Cor RRR EXTREMITIES: left arm/hand swelling, 2+ pitting pedal edema SKIN: Skin is ashen in appearance; exposed skin is dry and intact  NEURO: Alert and oriented x 3, forgetful, generalized weakness, ambulatory w/walker   (Duration of visit and documentation 60 minutes)   Daryl Eastern, RN BSN

## 2019-10-24 DIAGNOSIS — N186 End stage renal disease: Secondary | ICD-10-CM | POA: Diagnosis not present

## 2019-10-24 DIAGNOSIS — D631 Anemia in chronic kidney disease: Secondary | ICD-10-CM | POA: Diagnosis not present

## 2019-10-24 DIAGNOSIS — I120 Hypertensive chronic kidney disease with stage 5 chronic kidney disease or end stage renal disease: Secondary | ICD-10-CM | POA: Diagnosis not present

## 2019-10-24 DIAGNOSIS — D62 Acute posthemorrhagic anemia: Secondary | ICD-10-CM | POA: Diagnosis not present

## 2019-10-24 DIAGNOSIS — R55 Syncope and collapse: Secondary | ICD-10-CM | POA: Diagnosis not present

## 2019-10-24 DIAGNOSIS — K50011 Crohn's disease of small intestine with rectal bleeding: Secondary | ICD-10-CM | POA: Diagnosis not present

## 2019-10-25 DIAGNOSIS — D631 Anemia in chronic kidney disease: Secondary | ICD-10-CM | POA: Diagnosis not present

## 2019-10-25 DIAGNOSIS — D509 Iron deficiency anemia, unspecified: Secondary | ICD-10-CM | POA: Diagnosis not present

## 2019-10-25 DIAGNOSIS — E1129 Type 2 diabetes mellitus with other diabetic kidney complication: Secondary | ICD-10-CM | POA: Diagnosis not present

## 2019-10-25 DIAGNOSIS — Z992 Dependence on renal dialysis: Secondary | ICD-10-CM | POA: Diagnosis not present

## 2019-10-25 DIAGNOSIS — N186 End stage renal disease: Secondary | ICD-10-CM | POA: Diagnosis not present

## 2019-10-25 DIAGNOSIS — N2581 Secondary hyperparathyroidism of renal origin: Secondary | ICD-10-CM | POA: Diagnosis not present

## 2019-10-26 ENCOUNTER — Encounter (HOSPITAL_COMMUNITY)
Admission: RE | Admit: 2019-10-26 | Discharge: 2019-10-26 | Disposition: A | Discharge status: Home / Self Care | Payer: Medicare Other | Source: Ambulatory Visit | Attending: Physician Assistant | Admitting: Physician Assistant

## 2019-10-26 ENCOUNTER — Other Ambulatory Visit: Payer: Self-pay

## 2019-10-26 DIAGNOSIS — R634 Abnormal weight loss: Secondary | ICD-10-CM | POA: Insufficient documentation

## 2019-10-26 DIAGNOSIS — D631 Anemia in chronic kidney disease: Secondary | ICD-10-CM | POA: Insufficient documentation

## 2019-10-26 DIAGNOSIS — I120 Hypertensive chronic kidney disease with stage 5 chronic kidney disease or end stage renal disease: Secondary | ICD-10-CM | POA: Diagnosis not present

## 2019-10-26 DIAGNOSIS — N186 End stage renal disease: Secondary | ICD-10-CM | POA: Diagnosis not present

## 2019-10-26 DIAGNOSIS — N9989 Other postprocedural complications and disorders of genitourinary system: Secondary | ICD-10-CM | POA: Insufficient documentation

## 2019-10-26 DIAGNOSIS — K8020 Calculus of gallbladder without cholecystitis without obstruction: Secondary | ICD-10-CM | POA: Diagnosis not present

## 2019-10-26 DIAGNOSIS — K6389 Other specified diseases of intestine: Secondary | ICD-10-CM | POA: Diagnosis not present

## 2019-10-26 DIAGNOSIS — K50011 Crohn's disease of small intestine with rectal bleeding: Secondary | ICD-10-CM | POA: Diagnosis not present

## 2019-10-26 DIAGNOSIS — R55 Syncope and collapse: Secondary | ICD-10-CM | POA: Diagnosis not present

## 2019-10-26 DIAGNOSIS — R935 Abnormal findings on diagnostic imaging of other abdominal regions, including retroperitoneum: Secondary | ICD-10-CM | POA: Insufficient documentation

## 2019-10-26 DIAGNOSIS — D62 Acute posthemorrhagic anemia: Secondary | ICD-10-CM | POA: Diagnosis not present

## 2019-10-26 DIAGNOSIS — N189 Chronic kidney disease, unspecified: Secondary | ICD-10-CM | POA: Insufficient documentation

## 2019-10-26 LAB — GLUCOSE, CAPILLARY: Glucose-Capillary: 125 mg/dL — ABNORMAL HIGH (ref 70–99)

## 2019-10-26 MED ORDER — FLUDEOXYGLUCOSE F - 18 (FDG) INJECTION
5.9700 | Freq: Once | INTRAVENOUS | Status: AC
  Administered 2019-10-26: 5.97 via INTRAVENOUS

## 2019-10-27 ENCOUNTER — Other Ambulatory Visit: Payer: Self-pay

## 2019-10-27 ENCOUNTER — Inpatient Hospital Stay (HOSPITAL_COMMUNITY)
Admission: EM | Admit: 2019-10-27 | Discharge: 2019-10-30 | DRG: 385 | Disposition: A | Discharge status: Home / Self Care | Payer: Medicare Other | Source: Ambulatory Visit | Attending: Internal Medicine | Admitting: Internal Medicine

## 2019-10-27 ENCOUNTER — Encounter (HOSPITAL_COMMUNITY): Payer: Self-pay | Admitting: Emergency Medicine

## 2019-10-27 DIAGNOSIS — N186 End stage renal disease: Secondary | ICD-10-CM

## 2019-10-27 DIAGNOSIS — Z833 Family history of diabetes mellitus: Secondary | ICD-10-CM

## 2019-10-27 DIAGNOSIS — D631 Anemia in chronic kidney disease: Secondary | ICD-10-CM | POA: Diagnosis not present

## 2019-10-27 DIAGNOSIS — Z9841 Cataract extraction status, right eye: Secondary | ICD-10-CM

## 2019-10-27 DIAGNOSIS — Z20822 Contact with and (suspected) exposure to covid-19: Secondary | ICD-10-CM | POA: Diagnosis present

## 2019-10-27 DIAGNOSIS — I10 Essential (primary) hypertension: Secondary | ICD-10-CM | POA: Diagnosis not present

## 2019-10-27 DIAGNOSIS — N2581 Secondary hyperparathyroidism of renal origin: Secondary | ICD-10-CM | POA: Diagnosis present

## 2019-10-27 DIAGNOSIS — D649 Anemia, unspecified: Secondary | ICD-10-CM

## 2019-10-27 DIAGNOSIS — E7800 Pure hypercholesterolemia, unspecified: Secondary | ICD-10-CM | POA: Diagnosis not present

## 2019-10-27 DIAGNOSIS — R531 Weakness: Secondary | ICD-10-CM | POA: Diagnosis not present

## 2019-10-27 DIAGNOSIS — Z992 Dependence on renal dialysis: Secondary | ICD-10-CM

## 2019-10-27 DIAGNOSIS — E118 Type 2 diabetes mellitus with unspecified complications: Secondary | ICD-10-CM | POA: Diagnosis present

## 2019-10-27 DIAGNOSIS — Z79899 Other long term (current) drug therapy: Secondary | ICD-10-CM

## 2019-10-27 DIAGNOSIS — Z9012 Acquired absence of left breast and nipple: Secondary | ICD-10-CM

## 2019-10-27 DIAGNOSIS — F329 Major depressive disorder, single episode, unspecified: Secondary | ICD-10-CM | POA: Diagnosis present

## 2019-10-27 DIAGNOSIS — E1129 Type 2 diabetes mellitus with other diabetic kidney complication: Secondary | ICD-10-CM | POA: Diagnosis not present

## 2019-10-27 DIAGNOSIS — D62 Acute posthemorrhagic anemia: Secondary | ICD-10-CM | POA: Diagnosis not present

## 2019-10-27 DIAGNOSIS — D61818 Other pancytopenia: Secondary | ICD-10-CM | POA: Diagnosis present

## 2019-10-27 DIAGNOSIS — Z66 Do not resuscitate: Secondary | ICD-10-CM | POA: Diagnosis present

## 2019-10-27 DIAGNOSIS — J449 Chronic obstructive pulmonary disease, unspecified: Secondary | ICD-10-CM | POA: Diagnosis present

## 2019-10-27 DIAGNOSIS — K50012 Crohn's disease of small intestine with intestinal obstruction: Secondary | ICD-10-CM | POA: Diagnosis present

## 2019-10-27 DIAGNOSIS — Z885 Allergy status to narcotic agent status: Secondary | ICD-10-CM

## 2019-10-27 DIAGNOSIS — Z923 Personal history of irradiation: Secondary | ICD-10-CM

## 2019-10-27 DIAGNOSIS — Z8249 Family history of ischemic heart disease and other diseases of the circulatory system: Secondary | ICD-10-CM

## 2019-10-27 DIAGNOSIS — K219 Gastro-esophageal reflux disease without esophagitis: Secondary | ICD-10-CM | POA: Diagnosis present

## 2019-10-27 DIAGNOSIS — Z853 Personal history of malignant neoplasm of breast: Secondary | ICD-10-CM

## 2019-10-27 DIAGNOSIS — K50011 Crohn's disease of small intestine with rectal bleeding: Principal | ICD-10-CM

## 2019-10-27 DIAGNOSIS — K922 Gastrointestinal hemorrhage, unspecified: Secondary | ICD-10-CM | POA: Diagnosis not present

## 2019-10-27 DIAGNOSIS — E1122 Type 2 diabetes mellitus with diabetic chronic kidney disease: Secondary | ICD-10-CM | POA: Diagnosis present

## 2019-10-27 DIAGNOSIS — E785 Hyperlipidemia, unspecified: Secondary | ICD-10-CM | POA: Diagnosis present

## 2019-10-27 DIAGNOSIS — Z7952 Long term (current) use of systemic steroids: Secondary | ICD-10-CM

## 2019-10-27 DIAGNOSIS — Z9842 Cataract extraction status, left eye: Secondary | ICD-10-CM

## 2019-10-27 DIAGNOSIS — R519 Headache, unspecified: Secondary | ICD-10-CM | POA: Diagnosis not present

## 2019-10-27 DIAGNOSIS — I5032 Chronic diastolic (congestive) heart failure: Secondary | ICD-10-CM | POA: Diagnosis not present

## 2019-10-27 DIAGNOSIS — Z803 Family history of malignant neoplasm of breast: Secondary | ICD-10-CM

## 2019-10-27 DIAGNOSIS — D509 Iron deficiency anemia, unspecified: Secondary | ICD-10-CM | POA: Diagnosis not present

## 2019-10-27 DIAGNOSIS — E1136 Type 2 diabetes mellitus with diabetic cataract: Secondary | ICD-10-CM | POA: Diagnosis present

## 2019-10-27 DIAGNOSIS — M1990 Unspecified osteoarthritis, unspecified site: Secondary | ICD-10-CM | POA: Diagnosis present

## 2019-10-27 DIAGNOSIS — M109 Gout, unspecified: Secondary | ICD-10-CM | POA: Diagnosis present

## 2019-10-27 DIAGNOSIS — I480 Paroxysmal atrial fibrillation: Secondary | ICD-10-CM | POA: Diagnosis present

## 2019-10-27 DIAGNOSIS — E876 Hypokalemia: Secondary | ICD-10-CM | POA: Diagnosis present

## 2019-10-27 DIAGNOSIS — I132 Hypertensive heart and chronic kidney disease with heart failure and with stage 5 chronic kidney disease, or end stage renal disease: Secondary | ICD-10-CM | POA: Diagnosis present

## 2019-10-27 DIAGNOSIS — Z882 Allergy status to sulfonamides status: Secondary | ICD-10-CM

## 2019-10-27 DIAGNOSIS — Z9221 Personal history of antineoplastic chemotherapy: Secondary | ICD-10-CM

## 2019-10-27 LAB — CBC
HCT: 27.1 % — ABNORMAL LOW (ref 36.0–46.0)
Hemoglobin: 7.4 g/dL — ABNORMAL LOW (ref 12.0–15.0)
MCH: 32.2 pg (ref 26.0–34.0)
MCHC: 27.3 g/dL — ABNORMAL LOW (ref 30.0–36.0)
MCV: 117.8 fL — ABNORMAL HIGH (ref 80.0–100.0)
Platelets: 96 10*3/uL — ABNORMAL LOW (ref 150–400)
RBC: 2.3 MIL/uL — ABNORMAL LOW (ref 3.87–5.11)
RDW: 18.4 % — ABNORMAL HIGH (ref 11.5–15.5)
WBC: 5.4 10*3/uL (ref 4.0–10.5)
nRBC: 0 % (ref 0.0–0.2)

## 2019-10-27 LAB — COMPREHENSIVE METABOLIC PANEL
ALT: 19 U/L (ref 0–44)
AST: 24 U/L (ref 15–41)
Albumin: 1.9 g/dL — ABNORMAL LOW (ref 3.5–5.0)
Alkaline Phosphatase: 47 U/L (ref 38–126)
Anion gap: 9 (ref 5–15)
BUN: 9 mg/dL (ref 8–23)
CO2: 32 mmol/L (ref 22–32)
Calcium: 7.5 mg/dL — ABNORMAL LOW (ref 8.9–10.3)
Chloride: 98 mmol/L (ref 98–111)
Creatinine, Ser: 1.72 mg/dL — ABNORMAL HIGH (ref 0.44–1.00)
GFR calc Af Amer: 32 mL/min — ABNORMAL LOW (ref >60)
GFR calc non Af Amer: 27 mL/min — ABNORMAL LOW (ref >60)
Glucose, Bld: 143 mg/dL — ABNORMAL HIGH (ref 70–99)
Potassium: 3 mmol/L — ABNORMAL LOW (ref 3.5–5.1)
Sodium: 139 mmol/L (ref 135–145)
Total Bilirubin: 0.7 mg/dL (ref 0.3–1.2)
Total Protein: 4.5 g/dL — ABNORMAL LOW (ref 6.5–8.1)

## 2019-10-27 MED ORDER — PANTOPRAZOLE SODIUM 40 MG IV SOLR
40.0000 mg | Freq: Two times a day (BID) | INTRAVENOUS | Status: DC
  Administered 2019-10-27 – 2019-10-30 (×5): 40 mg via INTRAVENOUS
  Filled 2019-10-27 (×5): qty 40

## 2019-10-27 MED ORDER — SODIUM CHLORIDE 0.9% IV SOLUTION: Freq: Once | INTRAVENOUS | Status: AC

## 2019-10-27 MED ORDER — SODIUM CHLORIDE 0.9 % IV SOLN: INTRAVENOUS | Status: DC

## 2019-10-27 MED ORDER — CARVEDILOL 6.25 MG PO TABS
6.2500 mg | ORAL_TABLET | Freq: Two times a day (BID) | ORAL | Status: DC
  Administered 2019-10-28 – 2019-10-30 (×5): 6.25 mg via ORAL
  Filled 2019-10-27 (×5): qty 1

## 2019-10-27 MED ORDER — ADULT MULTIVITAMIN W/MINERALS CH
1.0000 | ORAL_TABLET | Freq: Every day | ORAL | Status: DC
  Administered 2019-10-29 – 2019-10-30 (×2): 1 via ORAL
  Filled 2019-10-27 (×2): qty 1

## 2019-10-27 MED ORDER — VITAMIN B-12 1000 MCG PO TABS
1000.0000 ug | ORAL_TABLET | Freq: Every day | ORAL | Status: DC
  Administered 2019-10-27 – 2019-10-30 (×3): 1000 ug via ORAL
  Filled 2019-10-27 (×3): qty 1

## 2019-10-27 MED ORDER — ATORVASTATIN CALCIUM 10 MG PO TABS
20.0000 mg | ORAL_TABLET | Freq: Every day | ORAL | Status: DC
  Administered 2019-10-27 – 2019-10-30 (×3): 20 mg via ORAL
  Filled 2019-10-27 (×3): qty 2

## 2019-10-27 MED ORDER — ACETAMINOPHEN 325 MG PO TABS
650.0000 mg | ORAL_TABLET | Freq: Four times a day (QID) | ORAL | Status: DC | PRN
  Administered 2019-10-30: 650 mg via ORAL

## 2019-10-27 MED ORDER — ALLOPURINOL 100 MG PO TABS
100.0000 mg | ORAL_TABLET | Freq: Every day | ORAL | Status: DC
  Administered 2019-10-27 – 2019-10-30 (×3): 100 mg via ORAL
  Filled 2019-10-27 (×3): qty 1

## 2019-10-27 MED ORDER — DICYCLOMINE HCL 10 MG PO CAPS
20.0000 mg | ORAL_CAPSULE | Freq: Two times a day (BID) | ORAL | Status: DC
  Administered 2019-10-28 – 2019-10-30 (×4): 20 mg via ORAL
  Filled 2019-10-27 (×4): qty 2

## 2019-10-27 MED ORDER — VITAMIN D 25 MCG (1000 UNIT) PO TABS
1000.0000 [IU] | ORAL_TABLET | Freq: Every day | ORAL | Status: DC
  Administered 2019-10-29 – 2019-10-30 (×2): 1000 [IU] via ORAL
  Filled 2019-10-27 (×2): qty 1

## 2019-10-27 MED ORDER — ASCORBIC ACID 500 MG PO TABS
500.0000 mg | ORAL_TABLET | Freq: Every day | ORAL | Status: DC
  Administered 2019-10-27 – 2019-10-30 (×3): 500 mg via ORAL
  Filled 2019-10-27 (×3): qty 1

## 2019-10-27 MED ORDER — CALCIUM CARBONATE-VITAMIN D 500-200 MG-UNIT PO TABS
1.0000 | ORAL_TABLET | Freq: Every day | ORAL | Status: DC
  Administered 2019-10-29 – 2019-10-30 (×2): 1 via ORAL
  Filled 2019-10-27 (×3): qty 1

## 2019-10-27 MED ORDER — POTASSIUM CHLORIDE CRYS ER 20 MEQ PO TBCR
40.0000 meq | EXTENDED_RELEASE_TABLET | Freq: Once | ORAL | Status: AC
  Administered 2019-10-27: 40 meq via ORAL
  Filled 2019-10-27: qty 2

## 2019-10-27 MED ORDER — DRONABINOL 2.5 MG PO CAPS
2.5000 mg | ORAL_CAPSULE | Freq: Two times a day (BID) | ORAL | Status: DC
  Administered 2019-10-28 – 2019-10-30 (×5): 2.5 mg via ORAL
  Filled 2019-10-27 (×5): qty 1

## 2019-10-27 NOTE — H&P (Signed)
History and Physical        Hospital Admission Note Date: 10/27/2019  Patient name: Samantha Mckenzie Medical record number: [id number] Date of birth: 09-24-1936 Age: 84 y.o. Gender: female  PCP: Colon Branch, MD    Patient coming from:   I have reviewed all records in the [facility].    Chief Complaint:  Rectal Bleeding, Fatigue   HPI: Samantha Mckenzie is a 84 y.o. female with PMH of ESRD on HD and HTN. Patient also has a history of breast cancer in remission. She had recently been diagnosed with some type of gastrointestinal bleeding that was thought to be related to Crohn's disease though now it is not as clear whether that is the cause or not.  She has been admitted to the hospital several times, most recently approximately 3 weeks ago when she had atrial fibrillation with symptomatic anemia, again prior to that was September 11, 2019 with symptomatic anemia and syncope, in October admitted for cellulitis of her left arm, and in August she was admitted with "terminal ileitis"   Patient presents today with fatigue and generalized weakness. Reports rectal bleeding has been ongoing. She was found to be anemic at dialysis today and sent to ED.   Based on the notes from the gastroenterology service from a couple of weeks ago the patient had been on prednisone over the last several months but continued to have blood in her stool.  She had a colonoscopy August 26 she had stenosis of the terminal ileum with inability to intubated, was informed that she needed transfusions that were symptomatic or if her hemoglobin is less than 7 and a PET scan was can be ordered.  The PET scan was ordered yesterday, please see the results below.  CLINICAL DATA: Initial treatment strategy for wall thickening in a loop of mid-distal ileum seen on previous CT.  EXAM: NUCLEAR MEDICINE PET SKULL BASE TO THIGH   IMPRESSION: 1. Hypermetabolism  is identified in distal ileal loops, corresponding to abnormal loops seen on previous diagnostic abdomen/pelvis CT. While there is relatively diffuse physiologic small bowel uptake, the distal ileal loops with wall thickening show increased levels over background. Imaging features could be compatible with an infectious/inflammatory etiology or neoplasm. No associated small bowel obstruction. 2. Small hypermetabolic focus identified in the left neck without underlying lymphadenopathy discernible on CT data obtained for attenuation correction. Dedicated CT neck with contrast recommended to further evaluate. 3. Interstitial and ground-glass opacity in the left apex may be radiation fibrosis given the history of left breast cancer. While stable since 05/30/2027, no older comparative imaging studies are available. Consider follow-up CT chest without contrast in 3-6 months to ensure stability. 4. Cholelithiasis. 5. Bilateral renal atrophy with bilateral renal cysts of varying size and attenuation. No hypermetabolic renal lesion evident on PET imaging. 6. Left colonic diverticulosis.    ED work-up/course:   Review of the medical record shows that the patient has had multiple episodes of recurrent anemia over time.  She needs transfusion when she gets low.  At this time she is symptomatic, generally weak and continues to have bleeding per rectum.  The PET scan shows the  same findings of the increased uptake in the terminal ileum.  Will discuss with hospitalist for admission and gastroenterology as well.  Dr. Henrene Pastor is GI consultant she has seen. Pt is DNR  Due to poor IV access I was asked to place a peripheral IV.  Her left external jugular vein was patent and free, play successfully on first attempt, see procedure note  I discussed the case with Dr. Hilarie Fredrickson of the gastroenterology service who will see the patient in the morning, agreeable to admission to the hospitalist service with  transfusion overnight.  The patient is definitely weak deconditioned and borderline hypotensive though she does did have dialysis.  Type and screen ordered  I discussed the case with the hospitalist who will admit.   Review of Systems: Positives marked in 'bold' Constitutional: Denies fever, chills, diaphoresis, poor appetite and fatigue.  HEENT: Denies photophobia, eye pain, redness, hearing loss, ear pain, congestion, sore throat, rhinorrhea, sneezing, mouth sores, trouble swallowing, neck pain, neck stiffness and tinnitus.   Respiratory: Denies SOB, DOE, cough, chest tightness,  and wheezing.   Cardiovascular: Denies chest pain, palpitations and leg swelling.  Gastrointestinal: Denies nausea, vomiting, abdominal pain, diarrhea, constipation, blood in stool and abdominal distention.  Genitourinary: Denies dysuria, urgency, frequency, hematuria, flank pain and difficulty urinating.  Musculoskeletal: Denies myalgias, back pain, joint swelling, arthralgias and gait problem.  Skin: Denies pallor, rash and wound.  Neurological: Denies dizziness, seizures, syncope, weakness, light-headedness, numbness and headaches.  Hematological: Denies adenopathy. Easy bruising, personal or family bleeding history  Psychiatric/Behavioral: Denies suicidal ideation, mood changes, confusion, nervousness, sleep disturbance and agitation  Past Medical History: Past Medical History:  Diagnosis Date  . Allergic rhinitis   . Anemia   . Breast CA ([place])    surgery, chemo, XRT; had peripheral neuropathy (imbalance at times) after chemo  . Cellulitis LEFT arm recurrent 12/08/2011  . Chronic renal disease, stage IV (HCC)    Dr. Moshe Cipro  . Depression   . Fatigue   . GERD (gastroesophageal reflux disease)    gastritis, EGD 02/2007  . Gout   . Hyperlipidemia   . Hypertension   . Macular degeneration 2011  . Normal cardiac stress test 12/2014  . Osteoarthritis   . Renal insufficiency   . Syncope 09/12/2019      Past Surgical History:  Procedure Laterality Date  . AV FISTULA PLACEMENT Right 08/10/2019   Procedure: ARTERIOVENOUS (AV) FISTULA CREATION VERSUS GRAFT PLACEMENT;  Surgeon: Angelia Mould, MD;  Location: [facility];  Service: Vascular;  Laterality: Right;  . BIOPSY  06/06/2019   Procedure: BIOPSY;  Surgeon: Ladene Artist, MD;  Location: [facility];  Service: Endoscopy;;  . BIOPSY  09/17/2019   Procedure: BIOPSY;  Surgeon: Irving Copas., MD;  Location: [facility] END[facility]COPY;  Service: Gastroenterology;;  . cataracts bilaterally  1 -2012  . COLONOSCOPY WITH PROPOFOL N/A 06/06/2019   Procedure: COLONOSCOPY WITH PROPOFOL;  Surgeon: Ladene Artist, MD;  Location: [facility] ENDOSCOPY;  Service: Endoscopy;  Laterality: N/A;  . COLONOSCOPY WITH PROPOFOL N/A 09/17/2019   Procedure: COLONOSCOPY WITH PROPOFOL;  Surgeon: Rush Landmark Telford Nab., MD;  Location: [facility];  Service: Gastroenterology;  Laterality: N/A;  . ESOPHAGOGASTRODUODENOSCOPY (EGD) WITH PROPOFOL N/A 09/17/2019   Procedure: ESOPHAGOGASTRODUODENOSCOPY (EGD) WITH PROPOFOL;  Surgeon: Rush Landmark Telford Nab., MD;  Location: [facility];  Service: Gastroenterology;  Laterality: N/A;  . FLEXIBLE SIGMOIDOSCOPY N/A 09/15/2019   Procedure: FLEXIBLE SIGMOIDOSCOPY;  Surgeon: Yetta Flock, MD;  Location: [facility];  Service: Gastroenterology;  Laterality: N/A;  . IR FLUORO GUIDE CV LINE RIGHT  08/03/2019  . IR US GUIDE VASC ACCESS RIGHT  08/03/2019  . LUNG BIOPSY  1999   neg  . MASTECTOMY Left 1999   and lymphnodes   . POLYPECTOMY  09/17/2019   Procedure: POLYPECTOMY;  Surgeon: Mansouraty, Telford Nab., MD;  Location: [name];  Service: Gastroenterology;;    Medications: Prior to Admission medications   Medication Sig Start Date End Date Taking? Authorizing Provider  acetaminophen (TYLENOL) 325 MG tablet Take 2 tablets (650 mg total) by mouth every 6 (six) hours as needed for mild pain, fever or headache (or Fever >/=  101). 04/12/19   Emokpae, Courage, MD  allopurinol (ZYLOPRIM) 100 MG tablet Take 1 tablet (100 mg total) by mouth daily. 06/29/19   Granville Lewis C, PA-C  atorvastatin (LIPITOR) 20 MG tablet Take 1 tablet (20 mg total) by mouth daily. 06/29/19   Wille Celeste, PA-C  Calcium Carb-Cholecalciferol (CALTRATE 600+D3 PO) Take 1 tablet by mouth daily.     [provider]  carvedilol (COREG) 6.25 MG tablet Take 1 tablet (6.25 mg total) by mouth 2 (two) times daily with a meal. 09/10/19 10/10/19  Colon Branch, MD  Cholecalciferol (VITAMIN D3) 1000 UNITS CAPS Take 1 capsule by mouth daily.     [provider]  Darbepoetin Alfa (ARANESP) 100 MCG/0.5ML SOSY injection Inject 0.5 mLs (100 mcg total) into the vein every Saturday with hemodialysis. 08/11/19   Cherylann Ratel A, DO  dicyclomine (BENTYL) 10 MG capsule Take 2 capsules (20 mg total) by mouth 2 (two) times daily. 10/18/19   Colon Branch, MD  dronabinol (MARINOL) 2.5 MG capsule Take 1 capsule (2.5 mg total) by mouth 2 (two) times daily before a meal. 10/23/19   Colon Branch, MD  escitalopram (LEXAPRO) 10 MG tablet Take 2 tablets (20 mg total) by mouth daily. 07/06/19   Colon Branch, MD  LASIX 40 MG tablet Take 40 mg by mouth 2 (two) times daily. 08/23/19   [provider]  loperamide (IMODIUM A-D) 2 MG tablet Take 2 mg by mouth 4 (four) times daily as needed for diarrhea or loose stools.    [provider]  Multiple Vitamins-Minerals (CENTRUM SILVER PO) Take 1 tablet by mouth daily.     [provider]  nitroGLYCERIN (NITROGLYN) 2 % ointment Apply 1 inch topically every 12 (twelve) hours. Apply 1 inch topically to rectum every 12 hours 09/18/19   Patrecia Pour, MD  ondansetron (ZOFRAN ODT) 4 MG disintegrating tablet Take 1 tablet (4 mg total) by mouth 2 (two) times daily as needed for nausea or vomiting. 10/18/19   Colon Branch, MD  pantoprazole (PROTONIX) 40 MG tablet Take 1 tablet (40 mg total) by mouth daily. 10/18/19   Colon Branch, MD  predniSONE (DELTASONE) 10 MG tablet Take 4 tablets (40 mg total) by mouth daily with breakfast for 14 days, THEN 3 tablets (30 mg total) daily with breakfast for 14 days, THEN 2 tablets (20 mg total) daily with breakfast for 26 days. 09/18/19 11/11/19  Patrecia Pour, MD  vitamin B-12 (CYANOCOBALAMIN) 1000 MCG tablet Take 1,000 mcg by mouth daily.      [provider]  vitamin C (ASCORBIC ACID) 500 MG tablet Take 500 mg by mouth daily.    [provider]    Allergies:   Allergies  Allergen Reactions  . Codeine Nausea And Vomiting  . Morphine Nausea And  Vomiting  . Oxycodone-Acetaminophen Nausea And Vomiting  . Sulfonamide Derivatives Nausea And Vomiting  . Tramadol Hcl Nausea And Vomiting    Social History:  reports that she has never smoked. She has never used smokeless tobacco. She reports previous alcohol use. She reports that she does not use drugs.  Family History: Family History  Problem Relation Age of Onset  . Breast cancer Other        ? of   . Heart attack Father 68  . Diabetes Sister        2  . Hypertension Sister   . Colon cancer Neg Hx   . Stomach cancer Neg Hx     Physical Exam: Blood pressure (!) 95/31, pulse 66, temperature 99 F (37.2 C), temperature source Oral, resp. rate 12, SpO2 90 %. General: Alert, awake, oriented x3, in no acute distress.Pallor present.  Eyes:conjunctival pallor,anicteric sclera, pupils equal and reactive to light and accomodation, HEENT: normocephalic, atraumatic, oropharynx clear Neck: supple, no masses or lymphadenopathy, no goiter, no bruits, no JVD CVS: Regular rate and rhythm, without murmurs, rubs or gallops. AV fistula in right arm without erythema or redness. Chronic severe left UE edema. Mild bilateral lower extremity edema, symmetrical in nature.  Resp : Clear to auscultation bilaterally, no wheezing, rales or rhonchi. GI : Soft, nontender, nondistended, positive bowel sounds, no masses. No  hepatomegaly. No hernia.  Musculoskeletal: No clubbing or cyanosis, positive pedal pulses. No contracture. ROM intact  Neuro: Grossly intact, no focal neurological deficits, strength 5/5 upper and lower extremities bilaterally Psych: alert and oriented x 3, normal mood and affect Skin: no rashes or lesions, warm and dry. Catheter present in right upper chest wall.    LABS on Admission: I have personally reviewed all the labs and imagings below    Basic Metabolic Panel: Recent Labs  Lab 10/27/19 1656  NA 139  K 3.0*  CL 98  CO2 32  GLUCOSE 143*  BUN 9  CREATININE 1.72*  CALCIUM 7.5*   Liver Function Tests: Recent Labs  Lab 10/27/19 1656  AST 24  ALT 19  ALKPHOS 47  BILITOT 0.7  PROT 4.5*  ALBUMIN 1.9*   No results for input(s): LIPASE, AMYLASE in the last 168 hours. No results for input(s): AMMONIA in the last 168 hours. CBC: Recent Labs  Lab 10/27/19 1656  WBC 5.4  HGB 7.4*  HCT 27.1*  MCV 117.8*  PLT 96*   Cardiac Enzymes: No results for input(s): CKTOTAL, CKMB, CKMBINDEX, TROPONINI in the last 168 hours. BNP: Invalid input(s): POCBNP CBG: Recent Labs  Lab 10/26/19 1105  GLUCAP 125*    Radiological Exams on Admission:  NM PET Image Initial (PI) Skull Base To Thigh  Result Date: 10/26/2019 CLINICAL DATA:  Initial treatment strategy for wall thickening in a loop of mid-distal ileum seen on previous CT. EXAM: NUCLEAR MEDICINE PET SKULL BASE TO THIGH TECHNIQUE: 6.0 mCi F-18 FDG was injected intravenously. Full-ring PET imaging was performed from the skull base to thigh after the radiotracer. CT data was obtained and used for attenuation correction and anatomic localization. Fasting blood glucose: 125 mg/dl COMPARISON:  CT scan 09/14/2019 FINDINGS: Mediastinal blood pool activity: SUV max 3.0 Liver activity: SUV max NA NECK: Small hypermetabolic focus identified left neck (level II) without a discrete abnormal lymph node visible on noncontrast CT imaging.  Incidental CT findings: none CHEST: Bandlike uptake in the deep left anterior chest wall, site of mastectomy, likely related to the prior surgery. No hypermetabolic mediastinal  or hilar nodes. No suspicious pulmonary nodules on the CT scan. Incidental CT findings: Heart size upper normal. Coronary artery calcification is evident. Atherosclerotic calcification is noted in the wall of the thoracic aorta. Right IJ dialysis catheter tip is in the lower right atrium. Small bilateral pleural effusions noted. Interstitial and ground-glass opacity in the left apex is similar to cervical spine CT of 05/30/2019, potentially scarring. Architectural distortion and scarring in the anterior left lung along a staple line suggest prior wedge resection. There is atelectasis in the lower lungs bilaterally. Mosaic attenuation is nonspecific but can be related to air trapping secondary to small airways disease. ABDOMEN/PELVIS: No abnormal hypermetabolic activity within the liver, pancreas, adrenal glands, or spleen. No hypermetabolic lymph nodes in the abdomen or pelvis. Relatively diffuse FDG accumulation is identified in small bowel loops of the abdomen and pelvis, but is more intense in the loop demonstrating abnormal circumferential wall thickening as better characterized on CT scan of 09/14/2019. SUV max = 5.8 Incidental CT findings: Tiny calcified gallstones evident. Bilateral renal atrophy is associated with bilateral renal lesions of varying size and attenuation. As no hypermetabolic renal lesion is evident, these likely represent a combination of simple and complex cyst. There is abdominal aortic atherosclerosis without aneurysm. Diverticular changes are noted in the left colon. SKELETON: No focal hypermetabolic activity to suggest skeletal metastasis. Incidental CT findings: No worrisome lytic or sclerotic osseous abnormality. IMPRESSION: 1. Hypermetabolism is identified in distal ileal loops, corresponding to abnormal loops  seen on previous diagnostic abdomen/pelvis CT. While there is relatively diffuse physiologic small bowel uptake, the distal ileal loops with wall thickening show increased levels over background. Imaging features could be compatible with an infectious/inflammatory etiology or neoplasm. No associated small bowel obstruction. 2. Small hypermetabolic focus identified in the left neck without underlying lymphadenopathy discernible on CT data obtained for attenuation correction. Dedicated CT neck with contrast recommended to further evaluate. 3. Interstitial and ground-glass opacity in the left apex may be radiation fibrosis given the history of left breast cancer. While stable since 05/30/2027, no older comparative imaging studies are available. Consider follow-up CT chest without contrast in 3-6 months to ensure stability. 4. Cholelithiasis. 5. Bilateral renal atrophy with bilateral renal cysts of varying size and attenuation. No hypermetabolic renal lesion evident on PET imaging. 6. Left colonic diverticulosis. 7.  Aortic Atherosclerois (ICD10-170.0) Electronically Signed   By: Misty Stanley M.D.   On: 10/26/2019 12:56      EKG: Independently reviewed. No acute ST segment changes. Appears consistent with prior EKGs.    Assessment/Plan Active Problems:   Diabetes mellitus type 2 with complications (HCC)   HLD (hyperlipidemia)   Essential hypertension   Generalized weakness   Chronic diastolic CHF (congestive heart failure) (HCC)   COPD (chronic obstructive pulmonary disease) (HCC)   Acute blood loss anemia   ESRD (end stage renal disease) on dialysis ([place])   DNR (do not resuscitate)   Gastrointestinal hemorrhage  Gastrointestinal Hemorrhage with acute on chronic blood loss anemia  Patient presenting with continued rectal bleeding. Has had extensive work up and is followed by GI. See HPI for further details. Most recently had PET scan with  increased uptake in the terminal ileum. HgB 7.4 at  presentation. Platelets 96. Patient symptomatic with fatigue, generalized weakness, and occasional SOB. Hemodynamically stable.  -admit to observation, telemetry with continuous pulse ox  -transfuse with [facility]  -follow CBC  -GI consulted, will see in AM; potential plan for capsule endoscopy inpatient vs. Outpatient  -  NPO for now pending possible scope  -PT eval  -NS at 100 cc/hr    ESRD on HD  Completed HD this AM.  -will need nephro consult for HD if extended hospital stay planned  Hypokalemia  K 3.0 at admission. No acute EKG changes.  -Kdur 40 mEq, replete cautiously given ESRD  -hold Lasix for now   HTN  Some borderline soft BPs at admission.  -continue Coreg for now, hold if needed  -hold Lasix   Chronic HFpEF  Last echo in Aug 2018 with EF 55% and Grade I diastolic dysfunction.  -continue Coreg for now, hold if needed  -hold Lasix due to hypokalemia and low BPs   HLD  -continue statin therapy   T2DM  Last A1c 5.2 in Aug 2020. Not on any medications. Glucose 143 at admission. Patient has recently been prescribed Prednisone which may be contributing to increased glucose.  -monitor on BMET, add more frequent CBG checks if needed   DVT prophylaxis: SCDs due to active bleed   CODE STATUS: DNR   Consults called: GI called by ED provider   Family Communication: Admission, patients condition and plan of care including tests being ordered have been discussed with the patient who indicates understanding and agree with the plan and Code Status  Admission status: Observation   The medical decision making on this patient was of high complexity and the patient is at high risk for clinical deterioration, therefore this is a level 3 admission.  Severity of Illness:     Moderate  The appropriate patient status for this patient is OBSERVATION. Observation status is judged to be reasonable and necessary in order to provide the required intensity of service to ensure the patient's  safety. The patient's presenting symptoms, physical exam findings, and initial radiographic and laboratory data in the context of their medical condition is felt to place them at decreased risk for further clinical deterioration. Furthermore, it is anticipated that the patient will be medically stable for discharge from the hospital within 2 midnights of admission. The following factors support the patient status of observation.   " The patient's presenting symptoms include generalized weakness, fatigue, SOB. " The physical exam findings include pallor, extremity edema. " The initial radiographic and laboratory data are HgB 7.4, platelets 96.      Time Spent on Admission: 37 minutes      Melina Schools D.O.  [facility] 10/27/2019, 7:54 PM

## 2019-10-27 NOTE — ED Notes (Signed)
Pt unable to stand to do orthostatics pt. States she's too weak.

## 2019-10-27 NOTE — ED Provider Notes (Addendum)
[facility]ERGENCY DEPARTMENT Provider Note   CSN: [id number] Arrival date & time: 10/27/19  1620     History Chief Complaint  Patient presents with  . low Hgb  . Rectal Bleeding    Samantha Mckenzie is a 84 y.o. female.  HPI   This patient is an 84 year old female who is accompanied by her son, they are both historians as the patient has difficulty with much history, the son is a much better historian.  Additionally the medical record was extensively reviewed showing the patient's prior history.  This patient is a very pleasant 84 year old female, she has a history of end-stage renal disease and has been on dialysis since about August.  She had recently been diagnosed with some type of gastrointestinal bleeding that was thought to be related to Crohn's disease though now it is not as clear whether that is the cause or not.  She has been admitted to the hospital several times, most recently approximately 3 weeks ago when she had atrial fibrillation with symptomatic anemia, again prior to that was September 11, 2019 with symptomatic anemia and syncope, in October admitted for cellulitis of her left arm, and in August she was admitted with "terminal ileitis"  The patient had a history of breast cancer for which she underwent resection including lymph node resection of the axilla and has had chronic left upper extremity swelling after that.  She presents today with increasing generalized weakness, ongoing rectal bleeding and was found to be very anemic at dialysis so after she finished dialysis she was sent to the emergency department.  This anemia is recurrent, seems to be persistent, severe, temporarily improved with transfusions but because of ongoing rectal bleeding gets worse.  Based on the notes from the gastroenterology service from a couple of weeks ago the patient had been on prednisone over the last several months but continued to have blood in her stool.  She had a  colonoscopy August 26 she had stenosis of the terminal ileum with inability to intubated, was informed that she needed transfusions that were symptomatic or if her hemoglobin is less than 7 and a PET scan was can be ordered.  The PET scan was ordered yesterday, please see the results below.  CLINICAL DATA:  Initial treatment strategy for wall thickening in a loop of mid-distal ileum seen on previous CT.  EXAM: NUCLEAR MEDICINE PET SKULL BASE TO THIGH   IMPRESSION: 1. Hypermetabolism is identified in distal ileal loops, corresponding to abnormal loops seen on previous diagnostic abdomen/pelvis CT. While there is relatively diffuse physiologic small bowel uptake, the distal ileal loops with wall thickening show increased levels over background. Imaging features could be compatible with an infectious/inflammatory etiology or neoplasm. No associated small bowel obstruction. 2. Small hypermetabolic focus identified in the left neck without underlying lymphadenopathy discernible on CT data obtained for attenuation correction. Dedicated CT neck with contrast recommended to further evaluate. 3. Interstitial and ground-glass opacity in the left apex may be radiation fibrosis given the history of left breast cancer. While stable since 05/30/2027, no older comparative imaging studies are available. Consider follow-up CT chest without contrast in 3-6 months to ensure stability. 4. Cholelithiasis. 5. Bilateral renal atrophy with bilateral renal cysts of varying size and attenuation. No hypermetabolic renal lesion evident on PET imaging. 6. Left colonic diverticulosis.   Past Medical History:  Diagnosis Date  . Allergic rhinitis   . Anemia   . Breast CA ([place])    surgery, chemo,  XRT; had peripheral neuropathy (imbalance at times) after chemo  . Cellulitis LEFT arm recurrent 12/08/2011  . Chronic renal disease, stage IV (HCC)    Dr. Moshe Cipro  . Depression   . Fatigue   . GERD  (gastroesophageal reflux disease)    gastritis, EGD 02/2007  . Gout   . Hyperlipidemia   . Hypertension   . Macular degeneration 2011  . Normal cardiac stress test 12/2014  . Osteoarthritis   . Renal insufficiency   . Syncope 09/12/2019    Patient Active Problem List   Diagnosis Date Noted  . Lower GI bleed 10/02/2019  . Atrial fibrillation with rapid ventricular response ([place])   . Syncope 09/15/2019  . Rectal bleeding   . Malnutrition of moderate degree 09/14/2019  . DNR (do not resuscitate) 09/13/2019  . Symptomatic anemia 09/11/2019  . ESRD (end stage renal disease) on dialysis ([place])   . GI bleed   . Cellulitis of arm, left 08/02/2019  . Acidosis   . Dementia ([place]) 06/17/2019  . Abnormal CT scan, small bowel   . Dehydration 05/31/2019  . Acute blood loss anemia   . Ileitis 05/30/2019  . Terminal ileitis with complication ([place]) 81/19/1478  . COPD (chronic obstructive pulmonary disease) ([place]) 04/12/2019  . Crohn's disease of small intestine with rectal bleeding ([place]) 04/07/2019  . Chronic diastolic CHF (congestive heart failure) ([place]) 04/07/2019  . Hypokalemia 04/07/2019  . Hypocalcemia 04/07/2019  . Fall 04/07/2019  . Occult blood positive stool 04/07/2019  . Adjustment disorder with depressed mood   . Generalized abdominal pain   . Dysphasia 03/21/2019  . CAP (community acquired pneumonia) 03/20/2019  . Nausea vomiting and diarrhea 03/20/2019  . Acute respiratory failure with hypoxia ([place]) 03/20/2019  . Acute renal failure superimposed on stage 4 chronic kidney disease ([place]) 03/20/2019  . Lactic acidosis 03/20/2019  . Diabetic peripheral neuropathy associated with type 2 diabetes mellitus ([place]) 03/20/2019  . Hypomagnesemia 06/21/2018  . Generalized weakness 06/20/2018  . PCP NOTES >>>>>>>>>> 07/11/2015  . Sepsis ([place]) 04/19/2013  . Depression   . VITAMIN B12 DEFICIENCY 04/01/2008  . GERD (gastroesophageal reflux disease) 12/19/2007  . NEOP, MALIGNANT, FEMALE  BREAST NOS 07/07/2007  . Osteoarthritis 07/07/2007  . Diabetes mellitus type 2 with complications ([place]) 29/56/2130  . HLD (hyperlipidemia) 02/28/2007  . Gout 02/28/2007  . Macrocytic anemia 02/28/2007  . Essential hypertension 02/28/2007    Past Surgical History:  Procedure Laterality Date  . AV FISTULA PLACEMENT Right 08/10/2019   Procedure: ARTERIOVENOUS (AV) FISTULA CREATION VERSUS GRAFT PLACEMENT;  Surgeon: Angelia Mould, MD;  Location: [facility];  Service: Vascular;  Laterality: Right;  . BIOPSY  06/06/2019   Procedure: BIOPSY;  Surgeon: Ladene Artist, MD;  Location: [facility];  Service: Endoscopy;;  . BIOPSY  09/17/2019   Procedure: BIOPSY;  Surgeon: Irving Copas., MD;  Location: [facility]COPY;  Service: Gastroenterology;;  . cataracts bilaterally  1 -2012  . COLONOSCOPY WITH PROPOFOL N/A 06/06/2019   Procedure: COLONOSCOPY WITH PROPOFOL;  Surgeon: Ladene Artist, MD;  Location: [facility] ENDOSCOPY;  Service: Endoscopy;  Laterality: N/A;  . COLONOSCOPY WITH PROPOFOL N/A 09/17/2019   Procedure: COLONOSCOPY WITH PROPOFOL;  Surgeon: Rush Landmark Telford Nab., MD;  Location: [facility];  Service: Gastroenterology;  Laterality: N/A;  . ESOPHAGOGASTRODUODENOSCOPY (EGD) WITH PROPOFOL N/A 09/17/2019   Procedure: ESOPHAGOGASTRODUODENOSCOPY (EGD) WITH PROPOFOL;  Surgeon: Rush Landmark Telford Nab., MD;  Location: [facility];  Service: Gastroenterology;  Laterality: N/A;  . FLEXIBLE SIGMOIDOSCOPY N/A 09/15/2019   Procedure:  FLEXIBLE SIGMOIDOSCOPY;  Surgeon: Yetta Flock, MD;  Location: [facility];  Service: Gastroenterology;  Laterality: N/A;  . IR FLUORO GUIDE CV LINE RIGHT  08/03/2019  . IR US GUIDE VASC ACCESS RIGHT  08/03/2019  . LUNG BIOPSY  1999   neg  . MASTECTOMY Left 1999   and lymphnodes   . POLYPECTOMY  09/17/2019   Procedure: POLYPECTOMY;  Surgeon: Mansouraty, Telford Nab., MD;  Location: [facility] ENDOSCOPY;  Service: Gastroenterology;;     OB History   No  obstetric history on file.     Family History  Problem Relation Age of Onset  . Breast cancer Other        ? of   . Heart attack Father 82  . Diabetes Sister        2  . Hypertension Sister   . Colon cancer Neg Hx   . Stomach cancer Neg Hx     Social History   Tobacco Use  . Smoking status: Never Smoker  . Smokeless tobacco: Never Used  Substance Use Topics  . Alcohol use: Not Currently    Alcohol/week: 0.0 standard drinks    Comment: rarely  . Drug use: No    Home Medications Prior to Admission medications   Medication Sig Start Date End Date Taking? Authorizing Provider  acetaminophen (TYLENOL) 325 MG tablet Take 2 tablets (650 mg total) by mouth every 6 (six) hours as needed for mild pain, fever or headache (or Fever >/= 101). 04/12/19   Emokpae, Courage, MD  allopurinol (ZYLOPRIM) 100 MG tablet Take 1 tablet (100 mg total) by mouth daily. 06/29/19   [place] Lewis C, PA-C  atorvastatin (LIPITOR) 20 MG tablet Take 1 tablet (20 mg total) by mouth daily. 06/29/19   Wille Celeste, PA-C  Calcium Carb-Cholecalciferol (CALTRATE 600+D3 PO) Take 1 tablet by mouth daily.     [provider]  carvedilol (COREG) 6.25 MG tablet Take 1 tablet (6.25 mg total) by mouth 2 (two) times daily with a meal. 09/10/19 10/10/19  Colon Branch, MD  Cholecalciferol (VITAMIN D3) 1000 UNITS CAPS Take 1 capsule by mouth daily.     [provider]  Darbepoetin Alfa (ARANESP) 100 MCG/0.5ML SOSY injection Inject 0.5 mLs (100 mcg total) into the vein every Saturday with hemodialysis. 08/11/19   Cherylann Ratel A, DO  dicyclomine (BENTYL) 10 MG capsule Take 2 capsules (20 mg total) by mouth 2 (two) times daily. 10/18/19   Colon Branch, MD  dronabinol (MARINOL) 2.5 MG capsule Take 1 capsule (2.5 mg total) by mouth 2 (two) times daily before a meal. 10/23/19   Colon Branch, MD  escitalopram (LEXAPRO) 10 MG tablet Take 2 tablets (20 mg total) by mouth daily. 07/06/19   Colon Branch, MD  LASIX 40 MG tablet  Take 40 mg by mouth 2 (two) times daily. 08/23/19   [provider]  loperamide (IMODIUM A-D) 2 MG tablet Take 2 mg by mouth 4 (four) times daily as needed for diarrhea or loose stools.    [provider]  Multiple Vitamins-Minerals (CENTRUM SILVER PO) Take 1 tablet by mouth daily.     [provider]  nitroGLYCERIN (NITROGLYN) 2 % ointment Apply 1 inch topically every 12 (twelve) hours. Apply 1 inch topically to rectum every 12 hours 09/18/19   Patrecia Pour, MD  ondansetron (ZOFRAN ODT) 4 MG disintegrating tablet Take 1 tablet (4 mg total) by mouth 2 (two) times daily as needed for nausea or vomiting.  10/18/19   Colon Branch, MD  pantoprazole (PROTONIX) 40 MG tablet Take 1 tablet (40 mg total) by mouth daily. 10/18/19   Colon Branch, MD  predniSONE (DELTASONE) 10 MG tablet Take 4 tablets (40 mg total) by mouth daily with breakfast for 14 days, THEN 3 tablets (30 mg total) daily with breakfast for 14 days, THEN 2 tablets (20 mg total) daily with breakfast for 26 days. 09/18/19 11/11/19  Patrecia Pour, MD  vitamin B-12 (CYANOCOBALAMIN) 1000 MCG tablet Take 1,000 mcg by mouth daily.      [provider]  vitamin C (ASCORBIC ACID) 500 MG tablet Take 500 mg by mouth daily.    [provider]    Allergies    Codeine, Morphine, Oxycodone-acetaminophen, Sulfonamide derivatives, and Tramadol hcl  Review of Systems   Review of Systems  All other systems reviewed and are negative.   Physical Exam Updated Vital Signs BP 97/65 (BP Location: Right Leg)   Pulse 82   Temp 99 F (37.2 C) (Oral)   Resp 16   SpO2 97%   Physical Exam Vitals and nursing note reviewed.  Constitutional:      General: She is not in acute distress.    Appearance: She is well-developed.  HENT:     Head: Normocephalic and atraumatic.     Mouth/Throat:     Pharynx: No oropharyngeal exudate.  Eyes:     General: No scleral icterus.       Right eye: No discharge.        Left eye: No  discharge.     Pupils: Pupils are equal, round, and reactive to light.     Comments: Pale conjunctive a  Neck:     Thyroid: No thyromegaly.     Vascular: No JVD.  Cardiovascular:     Rate and Rhythm: Normal rate and regular rhythm.     Heart sounds: Normal heart sounds. No murmur. No friction rub. No gallop.      Comments: Regular rate, catheter in the right upper chest wall appears clean, vascular access in the right upper extremity is clear without redness warmth or drainage Pulmonary:     Effort: Pulmonary effort is normal. No respiratory distress.     Breath sounds: Normal breath sounds. No wheezing or rales.  Abdominal:     General: Bowel sounds are normal. There is no distension.     Palpations: Abdomen is soft. There is no mass.     Tenderness: There is no abdominal tenderness.  Musculoskeletal:        General: No tenderness. Normal range of motion.     Cervical back: Normal range of motion and neck supple.     Comments: There is severe left upper extremity edema which is chronic according to the patient, mild bilateral lower extremity edema, symmetrical  Lymphadenopathy:     Cervical: No cervical adenopathy.  Skin:    General: Skin is warm and dry.     Findings: No erythema or rash.  Neurological:     Mental Status: She is alert.     Coordination: Coordination normal.     Comments: The patient is awake alert and able to follow commands, she is generally weak  Psychiatric:        Behavior: Behavior normal.     ED Results / Procedures / Treatments   Labs (all labs ordered are listed, but only abnormal results are displayed) Labs Reviewed  COMPREHENSIVE METABOLIC PANEL - Abnormal; Notable for the  following components:      Result Value   Potassium 3.0 (*)    Glucose, Bld 143 (*)    Creatinine, Ser 1.72 (*)    Calcium 7.5 (*)    Total Protein 4.5 (*)    Albumin 1.9 (*)    GFR calc non Af Amer 27 (*)    GFR calc Af Amer 32 (*)    All other components within normal  limits  CBC - Abnormal; Notable for the following components:   RBC 2.30 (*)    Hemoglobin 7.4 (*)    HCT 27.1 (*)    MCV 117.8 (*)    MCHC 27.3 (*)    RDW 18.4 (*)    Platelets 96 (*)    All other components within normal limits  POC OCCULT BLOOD, ED  TYPE AND SCREEN    EKG EKG Interpretation  Date/Time:  Saturday October 27 2019 16:35:46 EST Ventricular Rate:  81 PR Interval:  142 QRS Duration: 82 QT Interval:  412 QTC Calculation: 478 R Axis:   21 Text Interpretation: Normal sinus rhythm Minimal voltage criteria for LVH, may be normal variant ( Cornell product ) Borderline ECG abnormal T waves compare with prior Confirmed by Noemi Chapel [phone]) on 10/27/2019 4:45:52 PM   Radiology NM PET Image Initial (PI) Skull Base To Thigh  Result Date: 10/26/2019 CLINICAL DATA:  Initial treatment strategy for wall thickening in a loop of mid-distal ileum seen on previous CT. EXAM: NUCLEAR MEDICINE PET SKULL BASE TO THIGH TECHNIQUE: 6.0 mCi F-18 FDG was injected intravenously. Full-ring PET imaging was performed from the skull base to thigh after the radiotracer. CT data was obtained and used for attenuation correction and anatomic localization. Fasting blood glucose: 125 mg/dl COMPARISON:  CT scan 09/14/2019 FINDINGS: Mediastinal blood pool activity: SUV max 3.0 Liver activity: SUV max NA NECK: Small hypermetabolic focus identified left neck (level II) without a discrete abnormal lymph node visible on noncontrast CT imaging. Incidental CT findings: none CHEST: Bandlike uptake in the deep left anterior chest wall, site of mastectomy, likely related to the prior surgery. No hypermetabolic mediastinal or hilar nodes. No suspicious pulmonary nodules on the CT scan. Incidental CT findings: Heart size upper normal. Coronary artery calcification is evident. Atherosclerotic calcification is noted in the wall of the thoracic aorta. Right IJ dialysis catheter tip is in the lower right atrium. Small  bilateral pleural effusions noted. Interstitial and ground-glass opacity in the left apex is similar to cervical spine CT of 05/30/2019, potentially scarring. Architectural distortion and scarring in the anterior left lung along a staple line suggest prior wedge resection. There is atelectasis in the lower lungs bilaterally. Mosaic attenuation is nonspecific but can be related to air trapping secondary to small airways disease. ABDOMEN/PELVIS: No abnormal hypermetabolic activity within the liver, pancreas, adrenal glands, or spleen. No hypermetabolic lymph nodes in the abdomen or pelvis. Relatively diffuse FDG accumulation is identified in small bowel loops of the abdomen and pelvis, but is more intense in the loop demonstrating abnormal circumferential wall thickening as better characterized on CT scan of 09/14/2019. SUV max = 5.8 Incidental CT findings: Tiny calcified gallstones evident. Bilateral renal atrophy is associated with bilateral renal lesions of varying size and attenuation. As no hypermetabolic renal lesion is evident, these likely represent a combination of simple and complex cyst. There is abdominal aortic atherosclerosis without aneurysm. Diverticular changes are noted in the left colon. SKELETON: No focal hypermetabolic activity to suggest skeletal metastasis. Incidental CT findings: No  worrisome lytic or sclerotic osseous abnormality. IMPRESSION: 1. Hypermetabolism is identified in distal ileal loops, corresponding to abnormal loops seen on previous diagnostic abdomen/pelvis CT. While there is relatively diffuse physiologic small bowel uptake, the distal ileal loops with wall thickening show increased levels over background. Imaging features could be compatible with an infectious/inflammatory etiology or neoplasm. No associated small bowel obstruction. 2. Small hypermetabolic focus identified in the left neck without underlying lymphadenopathy discernible on CT data obtained for attenuation  correction. Dedicated CT neck with contrast recommended to further evaluate. 3. Interstitial and ground-glass opacity in the left apex may be radiation fibrosis given the history of left breast cancer. While stable since 05/30/2027, no older comparative imaging studies are available. Consider follow-up CT chest without contrast in 3-6 months to ensure stability. 4. Cholelithiasis. 5. Bilateral renal atrophy with bilateral renal cysts of varying size and attenuation. No hypermetabolic renal lesion evident on PET imaging. 6. Left colonic diverticulosis. 7.  Aortic Atherosclerois (ICD10-170.0) Electronically Signed   By: Misty Stanley M.D.   On: 10/26/2019 12:56    Procedures Ultrasound ED Peripheral IV (Provider)  Date/Time: 10/27/2019 6:53 PM Performed by: Noemi Chapel, MD Authorized by: Noemi Chapel, MD   Procedure details:    Indications: multiple failed IV attempts and poor IV access     Location: Left EJ.   Angiocath:  20 G   Bedside Ultrasound Guided: No     Patient tolerated procedure without complications: Yes     Dressing applied: Yes   Comments:        .Critical Care Performed by: Noemi Chapel, MD Authorized by: Noemi Chapel, MD   Critical care provider statement:    Critical care time (minutes):  35   Critical care time was exclusive of:  Separately billable procedures and treating other patients   Critical care was necessary to treat or prevent imminent or life-threatening deterioration of the following conditions: severe anemia with rectal ableeding.   Critical care was time spent personally by me on the following activities:  Discussions with consultants, evaluation of patient's response to treatment, examination of patient, ordering and performing treatments and interventions, ordering and review of laboratory studies, ordering and review of radiographic studies, pulse oximetry, re-evaluation of patient's condition, obtaining history from patient or surrogate and review  of old charts   (including critical care time)  Medications Ordered in ED Medications - No data to display  ED Course  I have reviewed the triage vital signs and the nursing notes.  Pertinent labs & imaging results that were available during my care of the patient were reviewed by me and considered in my medical decision making (see chart for details).    MDM Rules/Calculators/A&P                      Review of the medical record shows that the patient has had multiple episodes of recurrent anemia over time.  She needs transfusion when she gets low.  At this time she is symptomatic, generally weak and continues to have bleeding per rectum.  The PET scan shows the same findings of the increased uptake in the terminal ileum.  Will discuss with hospitalist for admission and gastroenterology as well.  Dr. Henrene Pastor is GI consultant she has seen. Pt is DNR  Due to poor IV access I was asked to place a peripheral IV.  Her left external jugular vein was patent and free, play successfully on first attempt, see procedure note  I discussed  the case with Dr. Hilarie Fredrickson of the gastroenterology service who will see the patient in the morning, agreeable to admission to the hospitalist service with transfusion overnight.  The patient is definitely weak deconditioned and borderline hypotensive though she does did have dialysis.  Type and screen ordered  I discussed the case with the hospitalist who will admit.  Final Clinical Impression(s) / ED Diagnoses Final diagnoses:  Gastrointestinal hemorrhage, unspecified gastrointestinal hemorrhage type  Acute on chronic anemia    Rx / DC Orders ED Discharge Orders    None       Noemi Chapel, MD 10/27/19 Marko Stai    Noemi Chapel, MD 11/05/19 1139

## 2019-10-27 NOTE — ED Triage Notes (Signed)
Pt had dialysis today and was told to come to ED due to low Hgb.  Pt states she did finish dialysis.  Reports chronic rectal bleeding that initially was diagnosed as Crohn's disease but son states they are now unsure that is the cause.  Pt reports generalized weakness.

## 2019-10-28 DIAGNOSIS — J449 Chronic obstructive pulmonary disease, unspecified: Secondary | ICD-10-CM | POA: Diagnosis present

## 2019-10-28 DIAGNOSIS — D649 Anemia, unspecified: Secondary | ICD-10-CM | POA: Diagnosis not present

## 2019-10-28 DIAGNOSIS — Z66 Do not resuscitate: Secondary | ICD-10-CM | POA: Diagnosis present

## 2019-10-28 DIAGNOSIS — R531 Weakness: Secondary | ICD-10-CM | POA: Diagnosis not present

## 2019-10-28 DIAGNOSIS — M1990 Unspecified osteoarthritis, unspecified site: Secondary | ICD-10-CM | POA: Diagnosis present

## 2019-10-28 DIAGNOSIS — I120 Hypertensive chronic kidney disease with stage 5 chronic kidney disease or end stage renal disease: Secondary | ICD-10-CM | POA: Diagnosis not present

## 2019-10-28 DIAGNOSIS — R933 Abnormal findings on diagnostic imaging of other parts of digestive tract: Secondary | ICD-10-CM

## 2019-10-28 DIAGNOSIS — N2581 Secondary hyperparathyroidism of renal origin: Secondary | ICD-10-CM | POA: Diagnosis present

## 2019-10-28 DIAGNOSIS — D631 Anemia in chronic kidney disease: Secondary | ICD-10-CM | POA: Diagnosis present

## 2019-10-28 DIAGNOSIS — D61818 Other pancytopenia: Secondary | ICD-10-CM | POA: Diagnosis present

## 2019-10-28 DIAGNOSIS — E876 Hypokalemia: Secondary | ICD-10-CM | POA: Diagnosis present

## 2019-10-28 DIAGNOSIS — M109 Gout, unspecified: Secondary | ICD-10-CM | POA: Diagnosis present

## 2019-10-28 DIAGNOSIS — Z992 Dependence on renal dialysis: Secondary | ICD-10-CM | POA: Diagnosis not present

## 2019-10-28 DIAGNOSIS — I132 Hypertensive heart and chronic kidney disease with heart failure and with stage 5 chronic kidney disease, or end stage renal disease: Secondary | ICD-10-CM | POA: Diagnosis present

## 2019-10-28 DIAGNOSIS — N186 End stage renal disease: Secondary | ICD-10-CM | POA: Diagnosis present

## 2019-10-28 DIAGNOSIS — K219 Gastro-esophageal reflux disease without esophagitis: Secondary | ICD-10-CM | POA: Diagnosis present

## 2019-10-28 DIAGNOSIS — K50012 Crohn's disease of small intestine with intestinal obstruction: Secondary | ICD-10-CM | POA: Diagnosis present

## 2019-10-28 DIAGNOSIS — D62 Acute posthemorrhagic anemia: Secondary | ICD-10-CM | POA: Diagnosis present

## 2019-10-28 DIAGNOSIS — Z853 Personal history of malignant neoplasm of breast: Secondary | ICD-10-CM | POA: Diagnosis not present

## 2019-10-28 DIAGNOSIS — K922 Gastrointestinal hemorrhage, unspecified: Secondary | ICD-10-CM | POA: Diagnosis not present

## 2019-10-28 DIAGNOSIS — E1122 Type 2 diabetes mellitus with diabetic chronic kidney disease: Secondary | ICD-10-CM | POA: Diagnosis present

## 2019-10-28 DIAGNOSIS — Z923 Personal history of irradiation: Secondary | ICD-10-CM | POA: Diagnosis not present

## 2019-10-28 DIAGNOSIS — F329 Major depressive disorder, single episode, unspecified: Secondary | ICD-10-CM | POA: Diagnosis present

## 2019-10-28 DIAGNOSIS — R195 Other fecal abnormalities: Secondary | ICD-10-CM | POA: Diagnosis not present

## 2019-10-28 DIAGNOSIS — E118 Type 2 diabetes mellitus with unspecified complications: Secondary | ICD-10-CM | POA: Diagnosis not present

## 2019-10-28 DIAGNOSIS — K50011 Crohn's disease of small intestine with rectal bleeding: Secondary | ICD-10-CM | POA: Diagnosis present

## 2019-10-28 DIAGNOSIS — E785 Hyperlipidemia, unspecified: Secondary | ICD-10-CM | POA: Diagnosis present

## 2019-10-28 DIAGNOSIS — I5032 Chronic diastolic (congestive) heart failure: Secondary | ICD-10-CM | POA: Diagnosis present

## 2019-10-28 DIAGNOSIS — Z20822 Contact with and (suspected) exposure to covid-19: Secondary | ICD-10-CM | POA: Diagnosis present

## 2019-10-28 DIAGNOSIS — I10 Essential (primary) hypertension: Secondary | ICD-10-CM | POA: Diagnosis not present

## 2019-10-28 DIAGNOSIS — I480 Paroxysmal atrial fibrillation: Secondary | ICD-10-CM | POA: Diagnosis present

## 2019-10-28 DIAGNOSIS — Z9221 Personal history of antineoplastic chemotherapy: Secondary | ICD-10-CM | POA: Diagnosis not present

## 2019-10-28 LAB — CBC
HCT: 34.9 % — ABNORMAL LOW (ref 36.0–46.0)
HCT: 35.7 % — ABNORMAL LOW (ref 36.0–46.0)
Hemoglobin: 11.4 g/dL — ABNORMAL LOW (ref 12.0–15.0)
Hemoglobin: 11.6 g/dL — ABNORMAL LOW (ref 12.0–15.0)
MCH: 31.1 pg (ref 26.0–34.0)
MCH: 31 pg (ref 26.0–34.0)
MCHC: 32.7 g/dL (ref 30.0–36.0)
MCHC: 32.5 g/dL (ref 30.0–36.0)
MCV: 95.1 fL (ref 80.0–100.0)
MCV: 95.5 fL (ref 80.0–100.0)
Platelets: 87 10*3/uL — ABNORMAL LOW (ref 150–400)
Platelets: 106 10*3/uL — ABNORMAL LOW (ref 150–400)
RBC: 3.67 MIL/uL — ABNORMAL LOW (ref 3.87–5.11)
RBC: 3.74 MIL/uL — ABNORMAL LOW (ref 3.87–5.11)
RDW: 18.6 % — ABNORMAL HIGH (ref 11.5–15.5)
RDW: 19.2 % — ABNORMAL HIGH (ref 11.5–15.5)
WBC: 6.1 10*3/uL (ref 4.0–10.5)
WBC: 6.1 10*3/uL (ref 4.0–10.5)
nRBC: 0.3 % — ABNORMAL HIGH (ref 0.0–0.2)
nRBC: 0 % (ref 0.0–0.2)

## 2019-10-28 LAB — BASIC METABOLIC PANEL
Anion gap: 10 (ref 5–15)
BUN: 16 mg/dL (ref 8–23)
CO2: 30 mmol/L (ref 22–32)
Calcium: 8 mg/dL — ABNORMAL LOW (ref 8.9–10.3)
Chloride: 100 mmol/L (ref 98–111)
Creatinine, Ser: 2.65 mg/dL — ABNORMAL HIGH (ref 0.44–1.00)
GFR calc Af Amer: 19 mL/min — ABNORMAL LOW (ref >60)
GFR calc non Af Amer: 16 mL/min — ABNORMAL LOW (ref >60)
Glucose, Bld: 71 mg/dL (ref 70–99)
Potassium: 4.2 mmol/L (ref 3.5–5.1)
Sodium: 140 mmol/L (ref 135–145)

## 2019-10-28 LAB — SAVE SMEAR(SSMR), FOR PROVIDER SLIDE REVIEW

## 2019-10-28 LAB — SARS CORONAVIRUS 2 (TAT 6-24 HRS): SARS Coronavirus 2: NEGATIVE

## 2019-10-28 MED ORDER — PREDNISONE 20 MG PO TABS
20.0000 mg | ORAL_TABLET | Freq: Every day | ORAL | Status: AC
  Administered 2019-10-28 – 2019-10-30 (×3): 20 mg via ORAL
  Filled 2019-10-28 (×3): qty 1

## 2019-10-28 MED ORDER — CHLORHEXIDINE GLUCONATE CLOTH 2 % EX PADS
6.0000 | MEDICATED_PAD | Freq: Every day | CUTANEOUS | Status: DC
  Administered 2019-10-28 – 2019-10-29 (×2): 6 via TOPICAL

## 2019-10-28 MED ORDER — PEG-KCL-NACL-NASULF-NA ASC-C 100 G PO SOLR
0.5000 | Freq: Once | ORAL | Status: AC
  Administered 2019-10-28: 100 g via ORAL
  Filled 2019-10-28 (×2): qty 1

## 2019-10-28 NOTE — Progress Notes (Signed)
PROGRESS NOTE    Samantha Mckenzie  UPJ:[id number]  DOB: Feb 15, 1936  DOA: 10/27/2019 PCP: Colon Branch, MD Outpatient Specialists:   Hospital course:  84 year old female with ESRD on HD, HFpEF, PAF with multiple recent admissions for GI bleed and symptomatic anemia was admitted 10/27/2019 with recurrent GI bleed and symptomatic anemia.  Patient recently underwent work-up including bidirectional endoscopy notable for stenosis of the terminal ileum.  A tentative diagnosis of Crohn's disease was made and patient was started on prednisone.  Her anemia was treated with blood transfusions.  Subjective:  Patient states she feels a little bit better but not a lot after her blood transfusion yesterday.  She notes that her rectal bleeding never really did stop even with the prednisone.  Patient states she is tired of being sick and wishes they could find out what is going on with her.  Objective: Vitals:   10/28/19 0246 10/28/19 0315 10/28/19 0522 10/28/19 1319  BP: (!) 114/37 (!) 108/42 120/67 126/62  Pulse: (!) 59 64 69 77  Resp: _0 Temp: 97.8 F (36.6 C) 98.2 F (36.8 C) (!) 97.3 F (36.3 C) 98.9 F (37.2 C)  TempSrc:    Oral  SpO2: 93% 98% 97% 96%    Intake/Output Summary (Last 24 hours) at 10/28/2019 1657 Last data filed at 10/28/2019 1500 Gross per 24 hour  Intake 999 ml  Output 2 ml  Net 997 ml   There were no vitals filed for this visit.   Assessment & Plan:   GI bleed Patient with unclear cause of ongoing GI bleed despite bidirectional endoscopy. Stenosis of terminal ileum was thought to be secondary to terminal ileitis and likely Crohn's disease however initiation of prednisone has not improved patient's symptoms. Patient seen by GI today who recommend capsule endoscopy for tomorrow. Patient is continued on prednisone although this can be reassessed by GI as warranted  Symptomatic anemia/developing pancytopenia with rising MCV Of note patient does not feel  much improved with 2 units PRBC despite rise in hemoglobin from 7 to 11. Also of note, MCV prior to transfusion was 117. Also of note patient's WBC has been declining as have her platelets. Patient is on vitamin B12 and her B12 levels are elevated. Discussed with Dr. Alvy Bimler who agrees that developing pancytopenia and markedly MCV warrants a work-up and she recommends outpatient follow-up with her within 1 to 2 weeks after discharge.  ESRD Patient was dialyzed yesterday 10/27/2019 We will contact renal to let them know she is staying in house for ongoing dialysis as needed  HTN Continue carvedilol Lasix being held given GI bleed  PAF On carvedilol for rate control Not on anticoagulation due to ongoing GI bleed  HFpEF Compensated for now  Lasix being held as noted above   DVT prophylaxis: Not on any anticoagulation due to ongoing GI bleed.  SCDs requested Code Status: DNR Family Communication: Patient states her family knows she is here Disposition Plan: Home   Consultants:  GI, Dr. Hilarie Fredrickson  Procedures:  Scheduled for capsule endoscopy tomorrow  Antimicrobials:  None   Exam:  General: Friendly somewhat pale female sitting up in chair in no acute cute distress. Eyes: sclera anicteric, conjuctiva mild injection bilaterally CVS: S1-S2 no murmur rubs or gallops Respiratory:  normal effort, symmetrical excursion, CTA without adventitious sounds.  GI: NABS, soft, NT, ND, no palpable masses.  LE: 1-2+ lower extremity edema left greater than right, patient states this is chronic Neuro: A/O x  3, Moving all extremities equally with normal strength, CN 3-12 intact, grossly nonfocal.  Psych: patient is logical and coherent, judgement and insight appear normal, mood and affect appropriate to situation.   Data Reviewed: Basic Metabolic Panel: Recent Labs  Lab 10/27/19 1656 10/28/19 0848  NA 139 140  K 3.0* 4.2  CL 98 100  CO2 32 30  GLUCOSE 143* 71  BUN 9 16    CREATININE 1.72* 2.65*  CALCIUM 7.5* 8.0*   Liver Function Tests: Recent Labs  Lab 10/27/19 1656  AST 24  ALT 19  ALKPHOS 47  BILITOT 0.7  PROT 4.5*  ALBUMIN 1.9*   No results for input(s): LIPASE, AMYLASE in the last 168 hours. No results for input(s): AMMONIA in the last 168 hours. CBC: Recent Labs  Lab 10/27/19 1656 10/28/19 0709 10/28/19 0848  WBC 5.4 6.1 6.1  HGB 7.4* 11.4* 11.6*  HCT 27.1* 34.9* 35.7*  MCV 117.8* 95.1 95.5  PLT 96* 87* 106*   Cardiac Enzymes: No results for input(s): CKTOTAL, CKMB, CKMBINDEX, TROPONINI in the last 168 hours. BNP (last 3 results) No results for input(s): PROBNP in the last 8760 hours. CBG: Recent Labs  Lab 10/26/19 1105  GLUCAP 125*    Recent Results (from the past 240 hour(s))  SARS CORONAVIRUS 2 (TAT 6-24 HRS) Nasopharyngeal Nasopharyngeal Swab     Status: None   Collection Time: 10/27/19  7:35 PM   Specimen: Nasopharyngeal Swab  Result Value Ref Range Status   SARS Coronavirus 2 NEGATIVE NEGATIVE Final    Comment: (NOTE) SARS-CoV-2 target nucleic acids are NOT DETECTED. The SARS-CoV-2 RNA is generally detectable in upper and lower respiratory specimens during the acute phase of infection. Negative results do not preclude SARS-CoV-2 infection, do not rule out co-infections with other pathogens, and should not be used as the sole basis for treatment or other patient management decisions. Negative results must be combined with clinical observations, patient history, and epidemiological information. The expected result is Negative. Fact Sheet for Patients: SugarRoll.be Fact Sheet for Healthcare Providers: https://www.woods-mathews.com/ This test is not yet approved or cleared by the [place] FDA and  has been authorized for detection and/or diagnosis of SARS-CoV-2 by FDA under an Emergency Use Authorization (EUA). This EUA will remain  in effect (meaning this test can be  used) for the duration of the COVID-19 declaration under Section 56 4(b)(1) of the Act, 21 U.S.C. section 360bbb-3(b)(1), unless the authorization is terminated or revoked sooner. Performed at [facility], [place] [address], [place], [place] [address]       Studies: No results found.   Scheduled Meds: . allopurinol  100 mg Oral Daily  . vitamin C  500 mg Oral Daily  . atorvastatin  20 mg Oral Daily  . calcium-vitamin D  1 tablet Oral Daily  . carvedilol  6.25 mg Oral BID WC  . Chlorhexidine Gluconate Cloth  6 each Topical Daily  . cholecalciferol  1,000 Units Oral Daily  . dicyclomine  20 mg Oral BID  . dronabinol  2.5 mg Oral BID AC  . multivitamin with minerals  1 tablet Oral Daily  . pantoprazole (PROTONIX) IV  40 mg Intravenous Q12H  . peg 3350 powder  0.5 kit Oral Once  . predniSONE  20 mg Oral QAC breakfast  . vitamin B-12  1,000 mcg Oral Daily   Continuous Infusions: . sodium chloride 100 mL/hr at 10/27/19 2225    Active Problems:   Diabetes mellitus type 2 with  complications (HCC)   HLD (hyperlipidemia)   Essential hypertension   Generalized weakness   Chronic diastolic CHF (congestive heart failure) (HCC)   COPD (chronic obstructive pulmonary disease) (HCC)   Acute blood loss anemia   Symptomatic anemia   ESRD (end stage renal disease) on dialysis [facility])   DNR (do not resuscitate)   Gastrointestinal hemorrhage     Vashti Hey, MD, FACP, [facility]. [facility]  If 7PM-7AM, please contact night-coverage [email or web address] Password [email or web address] 10/28/2019, 4:57 PM    LOS: 0 days

## 2019-10-28 NOTE — Plan of Care (Signed)

## 2019-10-28 NOTE — Evaluation (Signed)
Physical Therapy Evaluation Patient Details Name: Samantha Mckenzie MRN: [id number] DOB: [birth year] Today's Date: 10/28/2019   History of Present Illness  Patient is a 84 y/o female who presents with decreased hemoglobin, rectal bleeding and generalized weakness. Awaiting GI follow up. PMH includes depression, breast ca s/p mastectomy/chemo/radiation, chronic anemia, CAD, ESRD on HD TTS.  Clinical Impression  Patient presents with generalized weakness, tingling in BLEs, impaired balance and impaired mobility s/p above. Pt reports being independent with ambulation and ADLs PTA. Reports multiple falls at home. Lives with spouse. Today, pt requires Min A for bed mobility and gait training with use of RW for support. Noted to have some imbalance esp with turns. Pt is a high fall risk.  Sp02 90% on RA during activity. Will follow acutely to maximize independence and mobility prior to return home.    Follow Up Recommendations Home health PT;Supervision for mobility/OOB    Equipment Recommendations  Rolling walker with 5" wheels    Recommendations for Other Services       Precautions / Restrictions Precautions Precautions: Fall Precaution Comments: multiple falls at home Restrictions Weight Bearing Restrictions: No      Mobility  Bed Mobility Overal bed mobility: Needs Assistance Bed Mobility: Supine to Sit;Sit to Supine     Supine to sit: Min assist;HOB elevated Sit to supine: Min assist;HOB elevated   General bed mobility comments: Able to initiate bringing LEs to EOB, needs help with trunk and cues for technique. Assist to bring RLE into bed.  Transfers Overall transfer level: Needs assistance Equipment used: Rolling walker (2 wheeled) Transfers: Sit to/from Stand Sit to Stand: Min guard         General transfer comment: Min guard for safety. Stood from [employer].  Ambulation/Gait Ambulation/Gait assistance: Min assist Gait Distance (Feet): 150 Feet Assistive device:  Rolling walker (2 wheeled) Gait Pattern/deviations: Step-through pattern;Decreased stride length;Narrow base of support;Scissoring Gait velocity: decreased   General Gait Details: SLow, unsteady gait with narrow BoS and scissoring gait x3 esp with turns, Min A for balance. Sp02 90% on RA.  Stairs            Wheelchair Mobility    Modified Rankin (Stroke Patients Only)       Balance Overall balance assessment: Needs assistance Sitting-balance support: Feet supported;No upper extremity supported Sitting balance-Leahy Scale: Good     Standing balance support: During functional activity Standing balance-Leahy Scale: Poor Standing balance comment: Requires UE support in standing.                             Pertinent Vitals/Pain Pain Assessment: No/denies pain    Home Living Family/patient expects to be discharged to:: Private residence Living Arrangements: Spouse/significant other Available Help at Discharge: Family;Available 24 hours/day Type of Home: House Home Access: Level entry(1 step in)     Home Layout: One level Home Equipment: Walker - 2 wheels;Walker - 4 wheels;Cane - single point;Tub bench      Prior Function Level of Independence: Independent with assistive device(s)   Gait / Transfers Assistance Needed: Uses Rollator for ambulation per report but does report lots of falls  ADL's / Homemaking Assistance Needed: Does her own ADls. Sits in shower.        Hand Dominance   Dominant Hand: Right    Extremity/Trunk Assessment   Upper Extremity Assessment Upper Extremity Assessment: Defer to OT evaluation    Lower Extremity Assessment Lower Extremity Assessment: Generalized  weakness(Reports tingling and heavy feeling BLEs esp post chemo)       Communication   Communication: No difficulties  Cognition Arousal/Alertness: Awake/alert Behavior During Therapy: WFL for tasks assessed/performed Overall Cognitive Status: No  family/caregiver present to determine baseline cognitive functioning                                 General Comments: A&Ox4. however difficulty stating details of PLOF/history. "I sometimes forget those things," referring to the date/month.      General Comments General comments (skin integrity, edema, etc.): Son present at beginning of session. Works in [employer].    Exercises     Assessment/Plan    PT Assessment Patient needs continued PT services  PT Problem List Decreased strength;Decreased mobility;Decreased safety awareness;Decreased balance;Decreased activity tolerance;Decreased cognition       PT Treatment Interventions Therapeutic activities;Gait training;Therapeutic exercise;Patient/family education;Balance training;Functional mobility training;Cognitive remediation;DME instruction    PT Goals (Current goals can be found in the Care Plan section)  Acute Rehab PT Goals Patient Stated Goal: to get stronger PT Goal Formulation: With patient Time For Goal Achievement: 11/11/19 Potential to Achieve Goals: Good    Frequency Min 3X/week   Barriers to discharge Decreased caregiver support reports spouse falls at home too    Co-evaluation               AM-PAC PT "6 Clicks" Mobility  Outcome Measure Help needed turning from your back to your side while in a flat bed without using bedrails?: A Little Help needed moving from lying on your back to sitting on the side of a flat bed without using bedrails?: A Little Help needed moving to and from a bed to a chair (including a wheelchair)?: A Little Help needed standing up from a chair using your arms (e.g., wheelchair or bedside chair)?: A Little Help needed to walk in hospital room?: A Little Help needed climbing 3-5 steps with a railing? : A Little 6 Click Score: 18    End of Session Equipment Utilized During Treatment: Gait belt Activity Tolerance: Patient tolerated treatment  well Patient left: in bed;with call bell/phone within reach;with bed alarm set Nurse Communication: Mobility status PT Visit Diagnosis: Muscle weakness (generalized) (M62.81);Unsteadiness on feet (R26.81);Difficulty in walking, not elsewhere classified (R26.2)    Time: 1245-8099 PT Time Calculation (min) (ACUTE ONLY): 25 min   Charges:   PT Evaluation $PT Eval Moderate Complexity: 1 Mod PT Treatments $Gait Training: 8-22 mins        Marisa Severin, PT, DPT Acute Rehabilitation Services Pager [phone] Office [place] 10/28/2019, 12:35 PM

## 2019-10-28 NOTE — Consult Note (Signed)
Consultation  Referring Provider: Family medicine service /[place]  primary Care Physician:  Colon Branch, MD Primary Gastroenterologist:  Dr.Perry  Reason for Consultation: GI bleeding, anemia  HPI: Samantha Mckenzie is a 84 y.o. female  , admitted last evening with generalized weakness and ongoing complaints of rectal bleeding.  She had been at dialysis yesterday noted to be markedly anemic with hemoglobin 7.4 and directed to the emergency room post dialysis. Patient has history of end-stage renal disease, hypertension, congestive heart failure, chronic anemia, COPD, history of A. fib, prior history of breast cancer.  She has been evaluated by GI over the past several months and had a tentative diagnosis of Crohn's ileitis made in August 2020 after she had work-up during that hospitalization.  She had colonoscopy on 06/06/2019 that showed significant stenosis of the terminal ileum with inability to intubate the TI.  Interestingly recent CT scan did not show any abnormality of the terminal ileum but did show abnormal loops in the mid to distal ileum wall thickening and inflammatory changes. She had been started on prednisone late August which has been very slowly tapered.  She should be on 20 mg daily currently. Because of the abnormal loops noted on most recent CT scan question of malignancy was raised and it was felt that PET scan would be helpful to sort out.  She had PET scan on Friday, 10/26/2019 which did show a small hyper metabolic focus in the left neck, there was hyper metabolism identified in the distal ileal loops corresponding to the abnormal loops seen on previous diagnostic CT of the abdomen.  Features felt to be consistent with either infectious/inflammatory or neoplasm, there is no associated small bowel obstruction.  Patient has been transfused 2 units since admission and hemoglobin is 11.4 this morning. She says she feels pretty well currently.  She has not been having any  recent abdominal pain, she continues to have diarrhea, and sees red blood mixed with her bowel movements most of the time.  She says when she has an episode of diarrhea she will take Imodium and then may not have another bowel movement for a few days and diarrhea returns.   Past Medical History:  Diagnosis Date  . Allergic rhinitis   . Anemia   . Breast CA ([place])    surgery, chemo, XRT; had peripheral neuropathy (imbalance at times) after chemo  . Cellulitis LEFT arm recurrent 12/08/2011  . Chronic renal disease, stage IV (HCC)    Dr. Moshe Cipro  . Depression   . Fatigue   . GERD (gastroesophageal reflux disease)    gastritis, EGD 02/2007  . Gout   . Hyperlipidemia   . Hypertension   . Macular degeneration 2011  . Normal cardiac stress test 12/2014  . Osteoarthritis   . Renal insufficiency   . Syncope 09/12/2019    Past Surgical History:  Procedure Laterality Date  . AV FISTULA PLACEMENT Right 08/10/2019   Procedure: ARTERIOVENOUS (AV) FISTULA CREATION VERSUS GRAFT PLACEMENT;  Surgeon: Angelia Mould, MD;  Location: [place];  Service: Vascular;  Laterality: Right;  . BIOPSY  06/06/2019   Procedure: BIOPSY;  Surgeon: Ladene Artist, MD;  Location: [facility];  Service: Endoscopy;;  . BIOPSY  09/17/2019   Procedure: BIOPSY;  Surgeon: Irving Copas., MD;  Location: [facility] END[facility];  Service: Gastroenterology;;  . cataracts bilaterally  1 -2012  . COLONOSCOPY WITH PROPOFOL N/A 06/06/2019   Procedure: COLONOSCOPY WITH PROPOFOL;  Surgeon: Ladene Artist, MD;  Location: [facility] ENDOSCOPY;  Service: Endoscopy;  Laterality: N/A;  . COLONOSCOPY WITH PROPOFOL N/A 09/17/2019   Procedure: COLONOSCOPY WITH PROPOFOL;  Surgeon: Rush Landmark Telford Nab., MD;  Location: [place];  Service: Gastroenterology;  Laterality: N/A;  . ESOPHAGOGASTRODUODENOSCOPY (EGD) WITH PROPOFOL N/A 09/17/2019   Procedure: ESOPHAGOGASTRODUODENOSCOPY (EGD) WITH PROPOFOL;  Surgeon: Rush Landmark Telford Nab.,  MD;  Location: [place];  Service: Gastroenterology;  Laterality: N/A;  . FLEXIBLE SIGMOIDOSCOPY N/A 09/15/2019   Procedure: FLEXIBLE SIGMOIDOSCOPY;  Surgeon: Yetta Flock, MD;  Location: [place];  Service: Gastroenterology;  Laterality: N/A;  . IR FLUORO GUIDE CV LINE RIGHT  08/03/2019  . IR US GUIDE VASC ACCESS RIGHT  08/03/2019  . LUNG BIOPSY  1999   neg  . MASTECTOMY Left 1999   and lymphnodes   . POLYPECTOMY  09/17/2019   Procedure: POLYPECTOMY;  Surgeon: Mansouraty, Telford Nab., MD;  Location: [place];  Service: Gastroenterology;;    Prior to Admission medications   Medication Sig Start Date End Date Taking? Authorizing Provider  acetaminophen (TYLENOL) 325 MG tablet Take 2 tablets (650 mg total) by mouth every 6 (six) hours as needed for mild pain, fever or headache (or Fever >/= 101). 04/12/19  Yes Emokpae, Courage, MD  allopurinol (ZYLOPRIM) 100 MG tablet Take 1 tablet (100 mg total) by mouth daily. 06/29/19  Yes Lassen, Arlo C, PA-C  atorvastatin (LIPITOR) 20 MG tablet Take 1 tablet (20 mg total) by mouth daily. 06/29/19  Yes Lassen, Arlo C, PA-C  Calcium Carb-Cholecalciferol (CALTRATE 600+D3 PO) Take 1 tablet by mouth daily.    Yes [provider]  carvedilol (COREG) 6.25 MG tablet Take 1 tablet (6.25 mg total) by mouth 2 (two) times daily with a meal. 09/10/19 10/27/19 Yes Paz, Alda Berthold, MD  Cholecalciferol (VITAMIN D3) 1000 UNITS CAPS Take 1 capsule by mouth daily.    Yes [provider]  Darbepoetin Alfa (ARANESP) 100 MCG/0.5ML SOSY injection Inject 0.5 mLs (100 mcg total) into the vein every Saturday with hemodialysis. 08/11/19  Yes Kyle, Tyrone A, DO  dicyclomine (BENTYL) 10 MG capsule Take 2 capsules (20 mg total) by mouth 2 (two) times daily. 10/18/19  Yes Colon Branch, MD  dronabinol (MARINOL) 2.5 MG capsule Take 1 capsule (2.5 mg total) by mouth 2 (two) times daily before a meal. 10/23/19  Yes Paz, Alda Berthold, MD  escitalopram (LEXAPRO) 10 MG tablet  Take 2 tablets (20 mg total) by mouth daily. 07/06/19  Yes Paz, Alda Berthold, MD  LASIX 40 MG tablet Take 40 mg by mouth 2 (two) times daily. 08/23/19  Yes [provider]  loperamide (IMODIUM A-D) 2 MG tablet Take 2 mg by mouth 4 (four) times daily as needed for diarrhea or loose stools.   Yes [provider]  Multiple Vitamins-Minerals (CENTRUM SILVER PO) Take 1 tablet by mouth daily.    Yes [provider]  nitroGLYCERIN (NITROGLYN) 2 % ointment Apply 1 inch topically every 12 (twelve) hours. Apply 1 inch topically to rectum every 12 hours 09/18/19  Yes Patrecia Pour, MD  ondansetron (ZOFRAN ODT) 4 MG disintegrating tablet Take 1 tablet (4 mg total) by mouth 2 (two) times daily as needed for nausea or vomiting. 10/18/19  Yes Paz, Alda Berthold, MD  pantoprazole (PROTONIX) 40 MG tablet Take 1 tablet (40 mg total) by mouth daily. 10/18/19  Yes Paz, Alda Berthold, MD  vitamin B-12 (CYANOCOBALAMIN) 1000 MCG tablet Take 1,000 mcg by mouth daily.     Yes [provider]  vitamin C (ASCORBIC ACID) 500 MG tablet Take 500 mg by mouth daily.   Yes [provider]  predniSONE (DELTASONE) 10 MG tablet Take 4 tablets (40 mg total) by mouth daily with breakfast for 14 days, THEN 3 tablets (30 mg total) daily with breakfast for 14 days, THEN 2 tablets (20 mg total) daily with breakfast for 26 days. Patient not taking: Reported on 10/27/2019 09/18/19 11/11/19  Patrecia Pour, MD    Current Facility-Administered Medications  Medication Dose Route Frequency Provider Last Rate Last Admin  . 0.9 %  sodium chloride infusion   Intravenous Continuous Nicolette Bang, DO 100 mL/hr at 10/27/19 2225 New Bag at 10/27/19 2225  . acetaminophen (TYLENOL) tablet 650 mg  650 mg Oral Q6H PRN Nicolette Bang, DO      . allopurinol (ZYLOPRIM) tablet 100 mg  100 mg Oral Daily Nicolette Bang, DO   Stopped at 10/28/19 0825  . ascorbic acid (VITAMIN C) tablet 500 mg  500 mg Oral Daily  Nicolette Bang, DO   Stopped at 10/28/19 0825  . atorvastatin (LIPITOR) tablet 20 mg  20 mg Oral Daily Nicolette Bang, DO   Stopped at 10/28/19 0825  . calcium-vitamin D (OSCAL WITH D) 500-200 MG-UNIT per tablet 1 tablet  1 tablet Oral Daily Nicolette Bang, DO   Stopped at 10/28/19 [phone]  . carvedilol (COREG) tablet 6.25 mg  6.25 mg Oral BID WC Nicolette Bang, DO   Stopped at 10/28/19 [phone]  . Chlorhexidine Gluconate Cloth 2 % PADS 6 each  6 each Topical Daily Nicolette Bang, DO   6 each at 10/28/19 [phone]  . cholecalciferol (VITAMIN D3) tablet 1,000 Units  1,000 Units Oral Daily Nicolette Bang, DO   Stopped at 10/28/19 [phone]  . dicyclomine (BENTYL) capsule 20 mg  20 mg Oral BID Nicolette Bang, DO   Stopped at 10/28/19 [phone]  . dronabinol (MARINOL) capsule 2.5 mg  2.5 mg Oral BID AC Nicolette Bang, DO   Stopped at 10/28/19 0827  . multivitamin with minerals tablet 1 tablet  1 tablet Oral Daily Nicolette Bang, DO   Stopped at 10/28/19 0827  . pantoprazole (PROTONIX) injection 40 mg  40 mg Intravenous Q12H Nicolette Bang, DO   Stopped at 10/28/19 0827  . vitamin B-12 (CYANOCOBALAMIN) tablet 1,000 mcg  1,000 mcg Oral Daily Nicolette Bang, DO   Stopped at 10/28/19 0827    Allergies as of 10/27/2019 - Review Complete 10/27/2019  Allergen Reaction Noted  . Codeine Nausea And Vomiting 07/07/2007  . Morphine Nausea And Vomiting 07/07/2007  . Oxycodone-acetaminophen Nausea And Vomiting 07/07/2007  . Sulfonamide derivatives Nausea And Vomiting 07/07/2007  . Tramadol hcl Nausea And Vomiting 07/07/2007    Family History  Problem Relation Age of Onset  . Breast cancer Other        ? of   . Heart attack Father 74  . Diabetes Sister        2  . Hypertension Sister   . Colon cancer Neg Hx   . Stomach cancer Neg Hx     Social History   Socioeconomic History  . Marital status: Married     Spouse name: Not on file  . Number of children: 2  . Years of education: Not on file  . Highest education level: Not on file  Occupational History  . Occupation: retired     Fish farm manager: RETIRED  Tobacco Use  .  Smoking status: Never Smoker  . Smokeless tobacco: Never Used  Substance and Sexual Activity  . Alcohol use: Not Currently    Alcohol/week: 0.0 standard drinks    Comment: rarely  . Drug use: No  . Sexual activity: Not Currently  Other Topics Concern  . Not on file  Social History Narrative   Lives w/ husband , 2 children in [place], [place]           Social Determinants of Health   Financial Resource Strain:   . Difficulty of Paying Living Expenses: Not on file  Food Insecurity:   . Worried About Charity fundraiser in the Last Year: Not on file  . Ran Out of Food in the Last Year: Not on file  Transportation Needs: No Transportation Needs  . Lack of Transportation (Medical): No  . Lack of Transportation (Non-Medical): No  Physical Activity:   . Days of Exercise per Week: Not on file  . Minutes of Exercise per Session: Not on file  Stress:   . Feeling of Stress : Not on file  Social Connections:   . Frequency of Communication with Friends and Family: Not on file  . Frequency of Social Gatherings with Friends and Family: Not on file  . Attends Religious Services: Not on file  . Active Member of Clubs or Organizations: Not on file  . Attends Archivist Meetings: Not on file  . Marital Status: Not on file  Intimate Partner Violence:   . Fear of Current or Ex-Partner: Not on file  . Emotionally Abused: Not on file  . Physically Abused: Not on file  . Sexually Abused: Not on file    Review of Systems: Pertinent positive and negative review of systems were noted in the above HPI section.  All other review of systems was otherwise negative.  Physical Exam: Vital signs in last 24 hours: Temp:  [97.3 F (36.3 C)-99 F (37.2 C)] 97.3 F (36.3 C) (01/17  0522) Pulse Rate:  [59-82] 69 (01/17 0522) Resp:  [11-18] 16 (01/17 0522) BP: (95-127)/(28-67) 120/67 (01/17 0522) SpO2:  [88 %-100 %] 97 % (01/17 0522) Last BM Date: 10/28/19 General:   Alert,  Well-developed, well-nourished, elderly Hispanic female pleasant and cooperative in NAD Head:  Normocephalic and atraumatic. Eyes:  Sclera clear, no icterus.   Conjunctiva pink. Ears:  Normal auditory acuity. Nose:  No deformity, discharge,  or lesions. Mouth:  No deformity or lesions.   Neck:  Supple; no masses or thyromegaly. Lungs:  Clear throughout to auscultation.   No wheezes, crackles, or rhonchi.  Heart:  Regular rate and rhythm; no murmurs, clicks, rubs,  or gallops. Abdomen:  Soft,nontender, BS active,nonpalp mass or hsm.   Rectal:  Deferred  Msk:  Symmetrical without gross deformities. . Pulses:  Normal pulses noted. Extremities:  Without clubbing or edema. Neurologic:  Alert and  oriented x4;  grossly normal neurologically. Skin:  Intact without significant lesions or rashes.. Psych:  Alert and cooperative. Normal mood and affect.  Intake/Output from previous day: 01/16 0701 - 01/17 0700 In: 479 [I.V.:164; Blood:315] Out: -  Intake/Output this shift: No intake/output data recorded.  Lab Results: Recent Labs    10/27/19 1656 10/28/19 0709 10/28/19 0848  WBC 5.4 6.1 6.1  HGB 7.4* 11.4* 11.6*  HCT 27.1* 34.9* 35.7*  PLT 96* 87* 106*   BMET Recent Labs    10/27/19 1656 10/28/19 0848  NA 139 140  K 3.0* 4.2  CL 98 100  CO2 32 30  GLUCOSE 143* 71  BUN 9 16  CREATININE 1.72* 2.65*  CALCIUM 7.5* 8.0*   LFT Recent Labs    10/27/19 1656  PROT 4.5*  ALBUMIN 1.9*  AST 24  ALT 19  ALKPHOS 47  BILITOT 0.7   PT/INR No results for input(s): LABPROT, INR in the last 72 hours. Hepatitis Panel No results for input(s): HEPBSAG, HCVAB, HEPAIGM, HEPBIGM in the last 72 hours.    IMPRESSION:   #52 84 year old female with end-stage renal disease, on dialysis  admitted with progressive weakness and anemia with complaints of ongoing rectal bleeding. -Hemoglobin 6.9 on admission.  She has been transfused 2 units and hemoglobin 11.6 today  Patient had tentative diagnosis made of Crohn's ileitis in August 2020 based on CT imaging.  Colonoscopy was done but unable to intubate the terminal ileum due to stenosis.  She was placed on a course of steroids which she has been on over the past 5 months.  She had improvement in abdominal pain but has continued to have loose stool and small-volume hematochezia.  PET scan was obtained on 10/26/2019 because of concern for possible underlying malignancy. There is hypermetabolism of the distal ileal loops, but unclear whether this is inflammatory versus neoplastic Also noted to have 1 small hypermetabolic focus in the left neck   #2 history of breast cancer #3 hypertension.   PLAN: Regular diet for lunch today, then clear liquids, then n.p.o. after midnight Serial hemoglobins and transfuse for hemoglobin less than 7  .  Patient will be scheduled for capsule endoscopy for tomorrow 10/29/2019.  I discussed procedure with her in detail including indications risks and benefits.  We specifically discussed potential risk of capsule retention and she understands.  She has not had any evidence of bowel obstruction and has no current abdominal pain so reasonable to proceed with capsule.  Prep this evening Will resume prednisone 20 mg p.o. daily  Thank you will follow with you   Alma Mohiuddin PA-C 10/28/2019, 11:54 AM

## 2019-10-29 ENCOUNTER — Encounter (HOSPITAL_COMMUNITY)
Admission: EM | Disposition: A | Discharge status: Home / Self Care | Payer: Self-pay | Source: Ambulatory Visit | Attending: Internal Medicine

## 2019-10-29 ENCOUNTER — Other Ambulatory Visit: Payer: Self-pay

## 2019-10-29 ENCOUNTER — Encounter (HOSPITAL_COMMUNITY): Payer: Self-pay | Admitting: Internal Medicine

## 2019-10-29 DIAGNOSIS — Z992 Dependence on renal dialysis: Secondary | ICD-10-CM

## 2019-10-29 DIAGNOSIS — I5032 Chronic diastolic (congestive) heart failure: Secondary | ICD-10-CM

## 2019-10-29 DIAGNOSIS — N186 End stage renal disease: Secondary | ICD-10-CM

## 2019-10-29 DIAGNOSIS — I10 Essential (primary) hypertension: Secondary | ICD-10-CM

## 2019-10-29 DIAGNOSIS — K50011 Crohn's disease of small intestine with rectal bleeding: Principal | ICD-10-CM

## 2019-10-29 DIAGNOSIS — E118 Type 2 diabetes mellitus with unspecified complications: Secondary | ICD-10-CM

## 2019-10-29 DIAGNOSIS — K922 Gastrointestinal hemorrhage, unspecified: Secondary | ICD-10-CM

## 2019-10-29 HISTORY — PX: GIVENS CAPSULE STUDY: SHX5432

## 2019-10-29 LAB — TYPE AND SCREEN
ABO/RH(D): A POS
Antibody Screen: NEGATIVE
Unit division: 0
Unit division: 0

## 2019-10-29 LAB — BASIC METABOLIC PANEL
Anion gap: 11 (ref 5–15)
BUN: 26 mg/dL — ABNORMAL HIGH (ref 8–23)
CO2: 23 mmol/L (ref 22–32)
Calcium: 7.6 mg/dL — ABNORMAL LOW (ref 8.9–10.3)
Chloride: 103 mmol/L (ref 98–111)
Creatinine, Ser: 3.8 mg/dL — ABNORMAL HIGH (ref 0.44–1.00)
GFR calc Af Amer: 12 mL/min — ABNORMAL LOW (ref >60)
GFR calc non Af Amer: 11 mL/min — ABNORMAL LOW (ref >60)
Glucose, Bld: 169 mg/dL — ABNORMAL HIGH (ref 70–99)
Potassium: 4.5 mmol/L (ref 3.5–5.1)
Sodium: 137 mmol/L (ref 135–145)

## 2019-10-29 LAB — BPAM RBC
Blood Product Expiration Date: 202102082359
Blood Product Expiration Date: 202102082359
ISSUE DATE / TIME: 202101162255
ISSUE DATE / TIME: 202101170249
Unit Type and Rh: 6200
Unit Type and Rh: 6200

## 2019-10-29 LAB — CBC
HCT: 33.3 % — ABNORMAL LOW (ref 36.0–46.0)
Hemoglobin: 10.7 g/dL — ABNORMAL LOW (ref 12.0–15.0)
MCH: 31.1 pg (ref 26.0–34.0)
MCHC: 32.1 g/dL (ref 30.0–36.0)
MCV: 96.8 fL (ref 80.0–100.0)
Platelets: 93 10*3/uL — ABNORMAL LOW (ref 150–400)
RBC: 3.44 MIL/uL — ABNORMAL LOW (ref 3.87–5.11)
RDW: 18.7 % — ABNORMAL HIGH (ref 11.5–15.5)
WBC: 4.4 10*3/uL (ref 4.0–10.5)
nRBC: 0 % (ref 0.0–0.2)

## 2019-10-29 SURGERY — IMAGING PROCEDURE, GI TRACT, INTRALUMINAL, VIA CAPSULE
Anesthesia: LOCAL

## 2019-10-29 MED ORDER — ENSURE ENLIVE PO LIQD
237.0000 mL | Freq: Two times a day (BID) | ORAL | Status: DC
  Administered 2019-10-29: 237 mL via ORAL

## 2019-10-29 MED ORDER — NEPRO/CARBSTEADY PO LIQD: 237.0000 mL | Freq: Two times a day (BID) | ORAL | Status: DC

## 2019-10-29 MED ORDER — CHLORHEXIDINE GLUCONATE CLOTH 2 % EX PADS: 6.0000 | MEDICATED_PAD | Freq: Every day | CUTANEOUS | Status: DC

## 2019-10-29 SURGICAL SUPPLY — 1 items: TOWEL COTTON PACK 4EA (MISCELLANEOUS) ×4 IMPLANT

## 2019-10-29 NOTE — TOC Initial Note (Signed)
Transition of Care [facility]) - Initial/Assessment Note    Patient Details  Name: Samantha Mckenzie MRN: [id number] Date of Birth: December 11, 1935  [facility] CM/SW Contact:    Pollie Friar, RN Phone Number: 10/29/2019, 3:48 PM  Clinical Narrative:                 Pt is active with [facility] for PT. CM verified with Malachy Mood at [facility]. Pt wants to continue with them at d/c.  TOC following for [place] orders and d/c needs.   Expected Discharge Plan: [place] Barriers to Discharge: Continued Medical Work up   Patient Goals and CMS Choice   CMS Medicare.gov Compare Post Acute Care list provided to:: Patient Choice offered to / list presented to : Patient, Adult Children  Expected Discharge Plan and Services Expected Discharge Plan: [place]   Discharge Planning Services: CM Consult Post Acute Care Choice: [place] arrangements for the past 2 months: [place]: [place]        Prior Living Arrangements/Services Living arrangements for the past 2 months: Single Family Home Lives with:: Spouse Patient language and need for interpreter reviewed:: Yes Do you feel safe going back to the place where you live?: Yes      Need for Family Participation in Patient Care: Yes (Comment)   Current home services: Home PT([facility]) Criminal Activity/Legal Involvement Pertinent to Current Situation/Hospitalization: No - Comment as needed  Activities of Daily Living Home Assistive Devices/Equipment: Walker (specify type) ADL Screening (condition at time of admission) Patient's cognitive ability adequate to safely complete daily activities?: Yes Is the patient deaf or have difficulty hearing?: No Does the patient have difficulty seeing, even when wearing glasses/contacts?: No Does the patient have difficulty concentrating, remembering, or making decisions?:  No Patient able to express need for assistance with ADLs?: Yes Does the patient have difficulty dressing or bathing?: No Independently performs ADLs?: Yes (appropriate for developmental age) Does the patient have difficulty walking or climbing stairs?: Yes Weakness of Legs: Both Weakness of Arms/Hands: None  Permission Sought/Granted                  Emotional Assessment Appearance:: Appears stated age Attitude/Demeanor/Rapport: Engaged Affect (typically observed): Accepting Orientation: : Oriented to Self, Oriented to Place, Oriented to  Time, Oriented to Situation   Psych Involvement: No (comment)  Admission diagnosis:  Acute blood loss anemia [D62] Gastrointestinal hemorrhage, unspecified gastrointestinal hemorrhage type [K92.2] Acute on chronic anemia [D64.9] Symptomatic anemia [D64.9] Patient Active Problem List   Diagnosis Date Noted  . Gastrointestinal hemorrhage 10/27/2019  . Lower GI bleed 10/02/2019  . Atrial fibrillation with rapid ventricular response ([place])   . Syncope 09/15/2019  . Rectal bleeding   . Malnutrition of moderate degree 09/14/2019  . DNR (do not resuscitate) 09/13/2019  . Symptomatic anemia 09/11/2019  . ESRD (end stage renal disease) on dialysis ([place])   . GI bleed   . Cellulitis of arm, left 08/02/2019  . Acidosis   . Dementia ([place]) 06/17/2019  . Abnormal CT scan, small bowel   . Dehydration 05/31/2019  . Acute blood loss anemia   . Ileitis 05/30/2019  . Terminal ileitis with complication ([place]) 83/15/1761  . COPD (chronic obstructive pulmonary disease) ([place]) 04/12/2019  .  Crohn's disease of small intestine with rectal bleeding ([place]) 04/07/2019  . Chronic diastolic CHF (congestive heart failure) ([place]) 04/07/2019  . Hypokalemia 04/07/2019  . Hypocalcemia 04/07/2019  . Fall 04/07/2019  . Occult blood positive stool 04/07/2019  . Adjustment disorder with depressed mood   . Generalized abdominal pain   . Dysphasia 03/21/2019  . CAP  (community acquired pneumonia) 03/20/2019  . Nausea vomiting and diarrhea 03/20/2019  . Acute respiratory failure with hypoxia ([place]) 03/20/2019  . Acute renal failure superimposed on stage 4 chronic kidney disease ([place]) 03/20/2019  . Lactic acidosis 03/20/2019  . Diabetic peripheral neuropathy associated with type 2 diabetes mellitus ([place]) 03/20/2019  . Hypomagnesemia 06/21/2018  . Generalized weakness 06/20/2018  . PCP NOTES >>>>>>>>>> 07/11/2015  . Sepsis ([place]) 04/19/2013  . Depression   . VITAMIN B12 DEFICIENCY 04/01/2008  . GERD (gastroesophageal reflux disease) 12/19/2007  . NEOP, MALIGNANT, FEMALE BREAST NOS 07/07/2007  . Osteoarthritis 07/07/2007  . Diabetes mellitus type 2 with complications ([place]) 93/71/6967  . HLD (hyperlipidemia) 02/28/2007  . Gout 02/28/2007  . Macrocytic anemia 02/28/2007  . Essential hypertension 02/28/2007   PCP:  Colon Branch, MD Pharmacy:   [facility] [place] [address] [place] [address] Phone: [phone] Fax: [facility] [address] [place]. [place]. [place] [address] Phone: [phone] Fax: [phone]     Social Determinants of Health (SDOH) Interventions    Readmission Risk Interventions Readmission Risk Prevention Plan 09/12/2019 08/03/2019 08/03/2019  Transportation Screening Complete Complete -  PCP or Specialist Appt within 3-5 Days - - -  HRI or [place] for [place] - - -  Medication Review (RN Care Manager) Referral to Pharmacy Referral to Pharmacy -  PCP or Specialist appointment within 3-5 days of discharge Not Complete Complete -  PCP/Specialist Appt Not Complete comments plan for disposition pending - -  HRI or Home Care Consult Complete Complete -  SW Recovery Care/Counseling Consult Complete  Complete Complete  Palliative Care Screening Complete Complete Complete  Skilled Nursing Facility Complete Not Applicable -  Some recent data might be hidden

## 2019-10-29 NOTE — Consult Note (Addendum)
I have seen and examined this patient and agree with the plan of care   TTS dialysis patient admitted with symptomatic anemia and GI work up. It appears that there was a concern for a small hypermetabolic area in the terminal ileum that could indicate a possible neoplasm. We shall plan dialysis 10/29/18   Sherril Croon 10/29/2019, 10:15 PM [facility] Renal Consultation Note    Indication for Consultation:  Management of ESRD/hemodialysis; anemia, hypertension/volume and secondary hyperparathyroidism  PCP:Paz, Alda Berthold, MD  HPI: Samantha Mckenzie is a 84 y.o. female. ESRD on HD TTS at [facility], first starting in Nov 2020.  Past medical history significant for Hx breast CA s/p resection, chronic LUE edema, GERD, gout, HLD, HTN, A fib, and recent diagnosis of GI bleed thought to be related to Crohn's disease.  Patient has had multiple admissions, most recently 3 weeks ago with A fib and symptomatic anemia.  Of note patient is compliant with prescribed dialysis regimen.  Working to lower edw and reduce edema.  Last HD 10/27/19.     Seen and examined at bedside with no family present.  Patient is a poor historian, reviewed notes for additional history. Sent to the ED post dialysis due to low hemoglobin.  Reports intermittent GI bleeding x 1 year with dark cherry colored stool.  Denies abdominal pain, n/v and CP.  Admits to weakness, chronic diarrhea, occasional SOB and edema.  Appetite is returning.  Hemoglobin on admission 7.4.   Per GI tentative diagnosis of Crohn's ileitis made in August and has been on prednisone taper which should have resulted in a reduction of symptoms.  Colonoscopy in August showed significant stenosis of terminal  ileum with inability to intubate the TI. CT scan did not indicate abnormality in terminal ileum but showed a abnormal loops which raised concern for malignancy and PET scan performed on 10/26/19.  PET scan showed small hypermetabolic focus in L neck and distal  ileal loops corresponding to abnormal loops seen on CT and thought to be consistent with either infectious/inflammatory or neoplasm.      Patient has been admitted for further evaluation and management.    Past Medical History:  Diagnosis Date  . Allergic rhinitis   . Anemia   . Breast CA ([place])    surgery, chemo, XRT; had peripheral neuropathy (imbalance at times) after chemo  . Cellulitis LEFT arm recurrent 12/08/2011  . Chronic renal disease, stage IV (HCC)    Dr. Moshe Cipro  . Depression   . Fatigue   . GERD (gastroesophageal reflux disease)    gastritis, EGD 02/2007  . Gout   . Hyperlipidemia   . Hypertension   . Macular degeneration 2011  . Normal cardiac stress test 12/2014  . Osteoarthritis   . Renal insufficiency   . Syncope 09/12/2019   Past Surgical History:  Procedure Laterality Date  . AV FISTULA PLACEMENT Right 08/10/2019   Procedure: ARTERIOVENOUS (AV) FISTULA CREATION VERSUS GRAFT PLACEMENT;  Surgeon: Angelia Mould, MD;  Location: [place];  Service: Vascular;  Laterality: Right;  . BIOPSY  06/06/2019   Procedure: BIOPSY;  Surgeon: Ladene Artist, MD;  Location: [facility];  Service: Endoscopy;;  . BIOPSY  09/17/2019   Procedure: BIOPSY;  Surgeon: Irving Copas., MD;  Location: [facility] ENDOSCOPY;  Service: Gastroenterology;;  . cataracts bilaterally  1 -2012  . COLONOSCOPY WITH PROPOFOL N/A 06/06/2019   Procedure: COLONOSCOPY WITH PROPOFOL;  Surgeon: Ladene Artist, MD;  Location: [facility] ENDOSCOPY;  Service: Endoscopy;  Laterality: N/A;  . COLONOSCOPY WITH PROPOFOL N/A 09/17/2019   Procedure: COLONOSCOPY WITH PROPOFOL;  Surgeon: Rush Landmark Telford Nab., MD;  Location: [place];  Service: Gastroenterology;  Laterality: N/A;  . ESOPHAGOGASTRODUODENOSCOPY (EGD) WITH PROPOFOL N/A 09/17/2019   Procedure: ESOPHAGOGASTRODUODENOSCOPY (EGD) WITH PROPOFOL;  Surgeon: Rush Landmark Telford Nab., MD;  Location: [place];  Service: Gastroenterology;  Laterality:  N/A;  . FLEXIBLE SIGMOIDOSCOPY N/A 09/15/2019   Procedure: FLEXIBLE SIGMOIDOSCOPY;  Surgeon: Yetta Flock, MD;  Location: [place];  Service: Gastroenterology;  Laterality: N/A;  . IR FLUORO GUIDE CV LINE RIGHT  08/03/2019  . IR US GUIDE VASC ACCESS RIGHT  08/03/2019  . LUNG BIOPSY  1999   neg  . MASTECTOMY Left 1999   and lymphnodes   . POLYPECTOMY  09/17/2019   Procedure: POLYPECTOMY;  Surgeon: Mansouraty, Telford Nab., MD;  Location: [facility] ENDOSCOPY;  Service: Gastroenterology;;   Family History  Problem Relation Age of Onset  . Breast cancer Other        ? of   . Heart attack Father 41  . Diabetes Sister        2  . Hypertension Sister   . Colon cancer Neg Hx   . Stomach cancer Neg Hx    Social History:  reports that she has never smoked. She has never used smokeless tobacco. She reports previous alcohol use. She reports that she does not use drugs. Allergies  Allergen Reactions  . Codeine Nausea And Vomiting  . Morphine Nausea And Vomiting  . Oxycodone-Acetaminophen Nausea And Vomiting  . Sulfonamide Derivatives Nausea And Vomiting  . Tramadol Hcl Nausea And Vomiting   Prior to Admission medications   Medication Sig Start Date End Date Taking? Authorizing Provider  acetaminophen (TYLENOL) 325 MG tablet Take 2 tablets (650 mg total) by mouth every 6 (six) hours as needed for mild pain, fever or headache (or Fever >/= 101). 04/12/19  Yes Emokpae, Courage, MD  allopurinol (ZYLOPRIM) 100 MG tablet Take 1 tablet (100 mg total) by mouth daily. 06/29/19  Yes Lassen, Arlo C, PA-C  atorvastatin (LIPITOR) 20 MG tablet Take 1 tablet (20 mg total) by mouth daily. 06/29/19  Yes Lassen, Arlo C, PA-C  Calcium Carb-Cholecalciferol (CALTRATE 600+D3 PO) Take 1 tablet by mouth daily.    Yes [provider]  carvedilol (COREG) 6.25 MG tablet Take 1 tablet (6.25 mg total) by mouth 2 (two) times daily with a meal. 09/10/19 10/27/19 Yes Paz, Alda Berthold, MD  Cholecalciferol (VITAMIN D3)  1000 UNITS CAPS Take 1 capsule by mouth daily.    Yes [provider]  Darbepoetin Alfa (ARANESP) 100 MCG/0.5ML SOSY injection Inject 0.5 mLs (100 mcg total) into the vein every Saturday with hemodialysis. 08/11/19  Yes Kyle, Tyrone A, DO  dicyclomine (BENTYL) 10 MG capsule Take 2 capsules (20 mg total) by mouth 2 (two) times daily. 10/18/19  Yes Colon Branch, MD  dronabinol (MARINOL) 2.5 MG capsule Take 1 capsule (2.5 mg total) by mouth 2 (two) times daily before a meal. 10/23/19  Yes Paz, Alda Berthold, MD  escitalopram (LEXAPRO) 10 MG tablet Take 2 tablets (20 mg total) by mouth daily. 07/06/19  Yes Paz, Alda Berthold, MD  LASIX 40 MG tablet Take 40 mg by mouth 2 (two) times daily. 08/23/19  Yes [provider]  loperamide (IMODIUM A-D) 2 MG tablet Take 2 mg by mouth 4 (four) times daily as needed for diarrhea or loose stools.   Yes [provider]  Multiple Vitamins-Minerals (CENTRUM SILVER PO) Take 1 tablet by mouth daily.    Yes [provider]  ondansetron (ZOFRAN ODT) 4 MG disintegrating tablet Take 1 tablet (4 mg total) by mouth 2 (two) times daily as needed for nausea or vomiting. 10/18/19  Yes Paz, Alda Berthold, MD  pantoprazole (PROTONIX) 40 MG tablet Take 1 tablet (40 mg total) by mouth daily. 10/18/19  Yes Paz, Alda Berthold, MD  vitamin B-12 (CYANOCOBALAMIN) 1000 MCG tablet Take 1,000 mcg by mouth daily.     Yes [provider]  vitamin C (ASCORBIC ACID) 500 MG tablet Take 500 mg by mouth daily.   Yes [provider]  nitroGLYCERIN (NITROGLYN) 2 % ointment Apply 1 inch topically every 12 (twelve) hours. Apply 1 inch topically to rectum every 12 hours Patient not taking: Reported on 10/28/2019 09/18/19   Patrecia Pour, MD  predniSONE (DELTASONE) 10 MG tablet Take 4 tablets (40 mg total) by mouth daily with breakfast for 14 days, THEN 3 tablets (30 mg total) daily with breakfast for 14 days, THEN 2 tablets (20 mg total) daily with breakfast for 26 days. Patient not taking:  Reported on 10/27/2019 09/18/19 11/11/19  Patrecia Pour, MD   Current Facility-Administered Medications  Medication Dose Route Frequency Provider Last Rate Last Admin  . 0.9 %  sodium chloride infusion   Intravenous Continuous Nicolette Bang, DO 100 mL/hr at 10/29/19 0529 New Bag at 10/29/19 0529  . acetaminophen (TYLENOL) tablet 650 mg  650 mg Oral Q6H PRN Nicolette Bang, DO      . allopurinol (ZYLOPRIM) tablet 100 mg  100 mg Oral Daily Nicolette Bang, DO   100 mg at 10/29/19 1021  . ascorbic acid (VITAMIN C) tablet 500 mg  500 mg Oral Daily Nicolette Bang, DO   500 mg at 10/29/19 1022  . atorvastatin (LIPITOR) tablet 20 mg  20 mg Oral Daily Nicolette Bang, DO   20 mg at 10/29/19 1022  . calcium-vitamin D (OSCAL WITH D) 500-200 MG-UNIT per tablet 1 tablet  1 tablet Oral Daily Nicolette Bang, DO   1 tablet at 10/29/19 1022  . carvedilol (COREG) tablet 6.25 mg  6.25 mg Oral BID WC Nicolette Bang, DO   6.25 mg at 10/29/19 1022  . Chlorhexidine Gluconate Cloth 2 % PADS 6 each  6 each Topical Daily Nicolette Bang, DO   6 each at 10/29/19 1024  . cholecalciferol (VITAMIN D3) tablet 1,000 Units  1,000 Units Oral Daily Nicolette Bang, DO   1,000 Units at 10/29/19 1020  . dicyclomine (BENTYL) capsule 20 mg  20 mg Oral BID Nicolette Bang, DO   20 mg at 10/29/19 1021  . dronabinol (MARINOL) capsule 2.5 mg  2.5 mg Oral BID AC Nicolette Bang, DO   2.5 mg at 10/29/19 1022  . feeding supplement (ENSURE ENLIVE) (ENSURE ENLIVE) liquid 237 mL  237 mL Oral BID BM Hoyt Koch, MD      . multivitamin with minerals tablet 1 tablet  1 tablet Oral Daily Nicolette Bang, DO   1 tablet at 10/29/19 1024  . pantoprazole (PROTONIX) injection 40 mg  40 mg Intravenous Q12H Nicolette Bang, DO   40 mg at 10/29/19 1024  . predniSONE (DELTASONE) tablet 20 mg  20 mg Oral QAC breakfast  Esterwood, Amy S, PA-C   20 mg at 10/29/19 1022  . vitamin B-12 (CYANOCOBALAMIN) tablet 1,000 mcg  1,000 mcg Oral  Daily Nicolette Bang, DO   1,000 mcg at 10/29/19 1021   Labs: Basic Metabolic Panel: Recent Labs  Lab 10/27/19 1656 10/28/19 0848 10/29/19 0211  NA 139 140 137  K 3.0* 4.2 4.5  CL 98 100 103  CO2 32 30 23  GLUCOSE 143* 71 169*  BUN 9 16 26*  CREATININE 1.72* 2.65* 3.80*  CALCIUM 7.5* 8.0* 7.6*   Liver Function Tests: Recent Labs  Lab 10/27/19 1656  AST 24  ALT 19  ALKPHOS 47  BILITOT 0.7  PROT 4.5*  ALBUMIN 1.9*   CBC: Recent Labs  Lab 10/27/19 1656 10/27/19 1656 10/28/19 0709 10/28/19 0848 10/29/19 0211  WBC 5.4   < > 6.1 6.1 4.4  HGB 7.4*   < > 11.4* 11.6* 10.7*  HCT 27.1*   < > 34.9* 35.7* 33.3*  MCV 117.8*  --  95.1 95.5 96.8  PLT 96*   < > 87* 106* 93*   < > = values in this interval not displayed.   CBG: Recent Labs  Lab 10/26/19 1105  GLUCAP 125*    ROS: All others negative except those listed in HPI.  Physical Exam: Vitals:   10/28/19 1319 10/28/19 1955 10/29/19 0500 10/29/19 0755  BP: 126/62 (!) 116/51 (!) 154/71   Pulse: 77 85 62   Resp: 18 16 14    Temp: 98.9 F (37.2 C) 98.6 F (37 C) 98.5 F (36.9 C)   TempSrc: Oral Oral Oral   SpO2: 96% 96% 96%   Weight:    52.2 kg  Height:    5' 2"  (1.575 m)     General: WDWN, NAD, pleasant female Head: NCAT sclera not icteric MMM Neck: Supple. No lymphadenopathy Lungs: CTA bilaterally. No wheeze, rales or rhonchi. Breathing is unlabored. Heart: RRR. No murmur, rubs or gallops.  Abdomen: soft, nontender, +BS, no guarding, no rebound tenderness Extremities: 3+ edema LUE, 2+ LE no edema w/ no ischemic changes, or open wounds  Neuro: AAOx3. Moves all extremities spontaneously. Psych:  Responds to questions appropriately with a normal affect. Dialysis Access: LU AVF +b  Dialysis Orders:  TTS - [facility]  4hrs, BFR 400, DFR 800,  EDW 49.5kg, 2K/ 2.25Ca  Access: RU AVG   Heparin None Mircera 225 mcg q2wks - last 10/18/19 Venofer 52m qwk - last 1/16  Assessment/Plan: 1.  GI Bleed - capsule study today.  Thought to be Crohn's diease but now unsure.  Recent PET w/increased uptake in terminal ileum - inflammation vs neoplasm.  On prednisone. Per GI.  2.  ESRD -  On HD TTS.  Plan for HD tomorrow per regular schedule. K 4.5 s/p KDur.   3.  Hypertension/volume  - Blood pressure variable, but mostly well controlled. Continue carvedilol.  4.  Anemia of CKD - Hgb 7.4> 10.7 s/p 2 units pRBC. Continue ESA, due 1/21. See #1   5.  Secondary Hyperparathyroidism -  CCa 9.18. Will check phos. Not on binders or VDRA.  6.  Nutrition - On full liquid diet. Nepro. Vit. 7. Diastolic HF 8. DMT2  LJen Mow PA-C [facility] Pager: [phone]/18/2021, 12:50 PM

## 2019-10-29 NOTE — Consult Note (Signed)
   [facility] Inpatient Consult   10/29/2019  STACYE NOORI April 17, 1936 [id number]   Patient is currently active with [facility] for chronic disease management services.  Patient has been engaged by a [facility]  Our community based plan of care has focused on disease management and community resource support.  Admitted with GI Bleeding and for further studies. Patient was also active with [facility] Palliative program.  Plan:  Follow for progress and transitional needs with Inpatient Transition Of Care [TOC] team member and to make aware that [facility] following.   Of note, [facility] Care Management services does not replace or interfere with any services that are needed or arranged by inpatient [facility] care management team.  For additional questions or referrals please contact:  Natividad Brood, RN BSN [facility] Liaison  [phone] business mobile phone Toll free office [phone]  Fax number: [phone] [name]

## 2019-10-29 NOTE — Evaluation (Signed)
Occupational Therapy Evaluation Patient Details Name: Samantha Mckenzie MRN: [id number] DOB: Dec 15, 1935 Today's Date: 10/29/2019    History of Present Illness Patient is a 84 y/o female who presents with decreased hemoglobin, rectal bleeding and generalized weakness. Awaiting GI follow up. PMH includes depression, breast ca s/p mastectomy/chemo/radiation, chronic anemia, CAD, ESRD on HD TTS.   Clinical Impression   Patient is an 84 year old female that lives with her spouse in a single level home with level entry. Patient is modified independent at baseline using rollator for ambulation, per chart has history of falls. Currently patient was mod A with sit to stand from edge of bed and bedside commode, mod A without AD to take few steps from commode to recliner chair and set up for hygiene. Due to decreased activity tolerance, strength, safety awareness, balance recommend continued acute OT services to maximize patient independence with self care.   Unclear if patient's spouse is able to assist physically, today patient states to OT he could assist with transfers if needed "he has been doing that already, I call to him when I need help." However, per chart states husband cannot physically assist.    Follow Up Recommendations  Home health OT;Supervision/Assistance - 24 hour    Equipment Recommendations  3 in 1 bedside commode       Precautions / Restrictions Precautions Precautions: Fall Precaution Comments: multiple falls at home Restrictions Weight Bearing Restrictions: No      Mobility Bed Mobility Overal bed mobility: Needs Assistance Bed Mobility: Supine to Sit     Supine to sit: Min assist;HOB elevated     General bed mobility comments: Able to initiate bringing LEs to EOB, needs help with trunk and cues for technique.  Transfers Overall transfer level: Needs assistance Equipment used: 1 person hand held assist Transfers: Sit to/from Stand Sit to Stand: Mod assist          General transfer comment: mod A to power up from edge of bed, commode. posterior lean against bed frame upon standing    Balance Overall balance assessment: Needs assistance Sitting-balance support: Feet supported;No upper extremity supported Sitting balance-Leahy Scale: Good   Postural control: Posterior lean Standing balance support: During functional activity;Single extremity supported Standing balance-Leahy Scale: Poor Standing balance comment: requires UE support                           ADL either performed or assessed with clinical judgement   ADL Overall ADL's : Needs assistance/impaired     Grooming: Wash/dry hands;Set up;Sitting   Upper Body Bathing: Set up;Sitting   Lower Body Bathing: Moderate assistance;Sitting/lateral leans;Sit to/from stand   Upper Body Dressing : Set up;Sitting   Lower Body Dressing: Moderate assistance;Sitting/lateral leans;Sit to/from stand   Toilet Transfer: Moderate assistance;BSC;Ambulation;Cueing for safety Toilet Transfer Details (indicate cue type and reason): increased assist to power up to standing Toileting- Clothing Manipulation and Hygiene: Moderate assistance;Sit to/from stand Toileting - Clothing Manipulation Details (indicate cue type and reason): mod A for standing balance as patient performs peri care after small bowel movement     Functional mobility during ADLs: Moderate assistance;Cueing for safety General ADL Comments: decreased strength, activity tolerance, safety impacting patient indepencence with self care                  Pertinent Vitals/Pain Pain Assessment: No/denies pain     Hand Dominance Right   Extremity/Trunk Assessment Upper Extremity Assessment Upper Extremity Assessment:  Generalized weakness;LUE deficits/detail LUE: (L UE edematous, history of breast CA)   Lower Extremity Assessment Lower Extremity Assessment: Defer to PT evaluation       Communication  Communication Communication: No difficulties   Cognition Arousal/Alertness: Awake/alert Behavior During Therapy: WFL for tasks assessed/performed Overall Cognitive Status: No family/caregiver present to determine baseline cognitive functioning                                 General Comments: patient following directions appropriately, having difficulty recalling events from earlier in AM   General Comments  VSS            Home Living Family/patient expects to be discharged to:: Private residence Living Arrangements: Spouse/significant other Available Help at Discharge: Family;Available 24 hours/day Type of Home: House Home Access: Level entry     Home Layout: One level     Bathroom Shower/Tub: Occupational psychologist: Standard     Home Equipment: Environmental consultant - 2 wheels;Walker - 4 wheels;Cane - single point;Shower seat;Grab bars - tub/shower   Additional Comments: husband can only provide supervision      Prior Functioning/Environment Level of Independence: Independent with assistive device(s)  Gait / Transfers Assistance Needed: Uses Rollator for ambulation per report but does report lots of falls ADL's / Homemaking Assistance Needed: Does her own ADls. Sits in shower.   Comments: uses rollator at home, states that she has been having difficulty getting up from shower chair and getting dressed lately, reports she does not cook anymore        OT Problem List: Decreased strength;Decreased activity tolerance;Impaired balance (sitting and/or standing);Decreased safety awareness;Decreased cognition;Increased edema      OT Treatment/Interventions: Self-care/ADL training;Therapeutic exercise;Energy conservation;DME and/or AE instruction;Therapeutic activities;Cognitive remediation/compensation;Patient/family education;Balance training    OT Goals(Current goals can be found in the care plan section) Acute Rehab OT Goals Patient Stated Goal: to get  stronger OT Goal Formulation: With patient Time For Goal Achievement: 11/12/19 Potential to Achieve Goals: Good  OT Frequency: Min 2X/week    AM-PAC OT "6 Clicks" Daily Activity     Outcome Measure Help from another person eating meals?: None Help from another person taking care of personal grooming?: A Little Help from another person toileting, which includes using toliet, bedpan, or urinal?: A Lot Help from another person bathing (including washing, rinsing, drying)?: A Lot Help from another person to put on and taking off regular upper body clothing?: A Little Help from another person to put on and taking off regular lower body clothing?: A Lot 6 Click Score: 16   End of Session Nurse Communication: Mobility status  Activity Tolerance: Patient tolerated treatment well Patient left: in chair;with call bell/phone within reach  OT Visit Diagnosis: Other abnormalities of gait and mobility (R26.89);History of falling (Z91.81);Muscle weakness (generalized) (M62.81);Unsteadiness on feet (R26.81)                Time: 2633-3545 OT Time Calculation (min): 31 min Charges:  OT General Charges $OT Visit: 1 Visit OT Evaluation $OT Eval Moderate Complexity: 1 Mod OT Treatments $Self Care/Home Management : 8-22 mins  Shon Millet OT OT office: [place] 10/29/2019, 2:46 PM

## 2019-10-29 NOTE — Progress Notes (Signed)
IV site assessment: L EJ kinked under dressing. Dressing changed, brisk blood return noted. Pt with bilateral arm restrictions. RUE HD fistula, LUE lymphedema. Pt has R chest HD permcath she reports is no longer being used. Please consider tunneled central line if prolonged IV medications are needed.

## 2019-10-29 NOTE — Progress Notes (Signed)
PROGRESS NOTE    Samantha Mckenzie  ZJQ:[id number] DOB: Mar 25, 1936 DOA: 10/27/2019 PCP: Colon Branch, MD   Brief Narrative:  84 year old female with ESRD on HD, HFpEF, PAF with multiple recent admissions for GI bleed and symptomatic anemia was admitted 10/27/2019 with recurrent GI bleed and symptomatic anemia.  Patient recently underwent work-up including bidirectional endoscopy notable for stenosis of the terminal ileum.  A tentative diagnosis of Crohn's disease was made and patient was started on prednisone.  Her anemia was treated with blood transfusions.  10/29/19: due to recurrent bleeding while inpatient she is having capsule endoscopy today to evaluate SI which had thickening on imaging.   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 -Patient seen by GI who recommend capsule endoscopy  -Patient is continued on prednisone although this can be reassessed by GI as warranted  Symptomatic anemia/developing pancytopenia with rising MCV -Feeling okay today with fairly stable Hg 10.7 from 7 on admit -2 units PRBC this admission -capsule endoscopy 10/29/19 -Also of note, MCV prior to transfusion was 117. -Also of note patient's WBC has been declining as have her platelets. -Patient is on vitamin B12 and her B12 levels are elevated. -Discussed with Dr. Alvy Bimler who agrees that developing  pancytopenia and markedly MCV warrants a work-up and she recommends outpatient follow-up with her within 1 to 2 weeks after discharge.  ESRD -Tu/Th/Sa with last done 10/27/2019 -due tomorrow for dialysis and likely she will be here  HTN -Continue carvedilol -Lasix being held given GI bleed  PAF -On carvedilol for rate control -Not on anticoagulation due to ongoing GI bleed  HFpEF -Compensated for now  -Lasix being held as noted above  DVT prophylaxis: SCDs Code Status: DNR Family Communication: patient only Disposition Plan: home when stable  Consultants:   GI  Procedures:  2 unit PRBC this admission  Antimicrobials:   none   Subjective: Did not sleep much last night due to prep. Swallowed capsule this morning and hoping to get some answers from this. Yesterday with BM with small blood. Normal for her. Denies large volume blood loss. Denies BM today. Denies chest pains or SOB. Denies abdominal pain. Denies nausea or vomiting. Overall tired after the prep last night.   Objective: Vitals:   10/28/19 1319 10/28/19 1955 10/29/19 0500 10/29/19 0755  BP: 126/62 (!) 116/51 (!) 154/71   Pulse: 77 85 62   Resp: 18 16 14    Temp: 98.9 F (37.2 C) 98.6 F (37 C) 98.5 F (36.9 C)   TempSrc: Oral Oral Oral   SpO2: 96% 96% 96%   Weight:    52.2 kg  Height:    5' 2"  (1.575 m)    Intake/Output Summary (Last 24 hours) at 10/29/2019 1019 Last data filed at 10/29/2019 0500 Gross per 24 hour  Intake 2032.9 ml  Output 2 ml  Net 2030.9 ml   Filed Weights   10/29/19  0755  Weight: 52.2 kg    Examination:  General exam: Appears calm and comfortable, sleepy Respiratory system: Clear to auscultation. Respiratory effort normal. Cardiovascular system: S1 & S2 heard, RRR. No JVD, murmurs, rubs, gallops or clicks. No pedal edema. Gastrointestinal system: Abdomen is nondistended, soft and nontender. No organomegaly or masses felt. Normal bowel sounds heard. Central nervous  system: Alert and oriented. No focal neurological deficits. Extremities: Symmetric 5 x 5 power. Skin: No rashes, lesions or ulcers Psychiatry: Judgement and insight appear normal. Mood & affect appropriate.   Data Reviewed: I have personally reviewed following labs and imaging studies  CBC: Recent Labs  Lab 10/27/19 1656 10/28/19 0709 10/28/19 0848 10/29/19 0211  WBC 5.4 6.1 6.1 4.4  HGB 7.4* 11.4* 11.6* 10.7*  HCT 27.1* 34.9* 35.7* 33.3*  MCV 117.8* 95.1 95.5 96.8  PLT 96* 87* 106* 93*   Basic Metabolic Panel: Recent Labs  Lab 10/27/19 1656 10/28/19 0848 10/29/19 0211  NA 139 140 137  K 3.0* 4.2 4.5  CL 98 100 103  CO2 32 30 23  GLUCOSE 143* 71 169*  BUN 9 16 26*  CREATININE 1.72* 2.65* 3.80*  CALCIUM 7.5* 8.0* 7.6*   GFR: Estimated Creatinine Clearance: 9.2 mL/min (A) (by C-G formula based on SCr of 3.8 mg/dL (H)). Liver Function Tests: Recent Labs  Lab 10/27/19 1656  AST 24  ALT 19  ALKPHOS 47  BILITOT 0.7  PROT 4.5*  ALBUMIN 1.9*   No results for input(s): LIPASE, AMYLASE in the last 168 hours. No results for input(s): AMMONIA in the last 168 hours. Coagulation Profile: No results for input(s): INR, PROTIME in the last 168 hours. Cardiac Enzymes: No results for input(s): CKTOTAL, CKMB, CKMBINDEX, TROPONINI in the last 168 hours. BNP (last 3 results) No results for input(s): PROBNP in the last 8760 hours. HbA1C: No results for input(s): HGBA1C in the last 72 hours. CBG: Recent Labs  Lab 10/26/19 1105  GLUCAP 125*   Lipid Profile: No results for input(s): CHOL, HDL, LDLCALC, TRIG, CHOLHDL, LDLDIRECT in the last 72 hours. Thyroid Function Tests: No results for input(s): TSH, T4TOTAL, FREET4, T3FREE, THYROIDAB in the last 72 hours. Anemia Panel: No results for input(s): VITAMINB12, FOLATE, FERRITIN, TIBC, IRON, RETICCTPCT in the last 72 hours. Sepsis Labs: No results for input(s): PROCALCITON, LATICACIDVEN in the last 168 hours.  Recent Results  (from the past 240 hour(s))  SARS CORONAVIRUS 2 (TAT 6-24 HRS) Nasopharyngeal Nasopharyngeal Swab     Status: None   Collection Time: 10/27/19  7:35 PM   Specimen: Nasopharyngeal Swab  Result Value Ref Range Status   SARS Coronavirus 2 NEGATIVE NEGATIVE Final    Comment: (NOTE) SARS-CoV-2 target nucleic acids are NOT DETECTED. The SARS-CoV-2 RNA is generally detectable in upper and lower respiratory specimens during the acute phase of infection. Negative results do not preclude SARS-CoV-2 infection, do not rule out co-infections with other pathogens, and should not be used as the sole basis for treatment or other patient management decisions. Negative results must be combined with clinical observations, patient history, and epidemiological information. The expected result is Negative. Fact Sheet for Patients: SugarRoll.be Fact Sheet for Healthcare Providers: https://www.woods-mathews.com/ This test is not yet approved or cleared by the [place] FDA and  has been authorized for detection and/or diagnosis of SARS-CoV-2 by FDA under an Emergency Use Authorization (EUA). This EUA will remain  in effect (meaning this test can be used) for the duration of the COVID-19 declaration under Section 56 4(b)(1)  of the Act, 21 U.S.C. section 360bbb-3(b)(1), unless the authorization is terminated or revoked sooner. Performed at [facility], [address] [place], [place] [address]      Radiology Studies: No results found.  Scheduled Meds: . allopurinol  100 mg Oral Daily  . vitamin C  500 mg Oral Daily  . atorvastatin  20 mg Oral Daily  . calcium-vitamin D  1 tablet Oral Daily  . carvedilol  6.25 mg Oral BID WC  . Chlorhexidine Gluconate Cloth  6 each Topical Daily  . cholecalciferol  1,000 Units Oral Daily  . dicyclomine  20 mg Oral BID  . dronabinol  2.5 mg Oral BID AC  . feeding supplement (ENSURE ENLIVE)  237 mL Oral BID BM  .  multivitamin with minerals  1 tablet Oral Daily  . pantoprazole (PROTONIX) IV  40 mg Intravenous Q12H  . predniSONE  20 mg Oral QAC breakfast  . vitamin B-12  1,000 mcg Oral Daily   Continuous Infusions: . sodium chloride 100 mL/hr at 10/29/19 0529     LOS: 1 day   Time spent: [place], MD [facility] Pager [phone]  If 7PM-7AM, please contact night-coverage [email or web address] Password [facility] 10/29/2019, 10:19 AM

## 2019-10-29 NOTE — Progress Notes (Signed)
Patient ingested capsule at 0810. Swallowed capsule with no difficulties. Patient educated and verbalized understanding.

## 2019-10-29 NOTE — Progress Notes (Signed)
Initial Nutrition Assessment  RD working remotely.  DOCUMENTATION CODES:   Not applicable  INTERVENTION:   -Ensure Enlive po BID, each supplement provides 350 kcal and 20 grams of protein -Renal MVI daily  NUTRITION DIAGNOSIS:   Increased nutrient needs related to chronic illness(ESRD on HD) as evidenced by estimated needs.  GOAL:   Patient will meet greater than or equal to 90% of their needs  MONITOR:   Supplement acceptance, Labs, PO intake, Weight trends, Skin, I & O's  REASON FOR ASSESSMENT:   Malnutrition Screening Tool    ASSESSMENT:   Samantha Mckenzie is a 84 y.o. female with PMH of ESRD on HD and HTN. Patient also has a history of breast cancer in remission. She had recently been diagnosed with some type of gastrointestinal bleeding that was thought to be related to Crohn's disease though now it is not as clear whether that is the cause or not.  She has been admitted to the hospital several times, most recently approximately 3 weeks ago when she had atrial fibrillation with symptomatic anemia, again prior to that was September 11, 2019 with symptomatic anemia and syncope, in October admitted for cellulitis of her left arm, and in August she was admitted with "terminal ileitis"  Pt admitted with gastrointestinal hemorrhage and acute on chronic blood loss anemia.   1/18- s/p capsule endoscopy  Reviewed I/O's: +2 L x 24 hours and +2.6 L since admission  Attempted to speak with pt via phone, however, no answer.   Pt currently on full liquid diet. Per MD notes, her appetite is returning. Documented meal completion 100%.   Reviewed wt hx; noted pt has experienced 10.3% wt loss overt the past 3 months, which is significant for time frame. Per nephology notes, dry weight is 49.5 kg.   Given weight loss and restrictions of full liquid diet, pt would greatly benefit from addition of oral nutrition supplements.   Therapy recommends home health at discharge.  Labs  reviewed.   Diet Order:   Diet Order            Diet full liquid Room service appropriate? Yes; Fluid consistency: Thin  Diet effective now              EDUCATION NEEDS:   No education needs have been identified at this time  Skin:  Skin Assessment: Reviewed RN Assessment  Last BM:  10/29/19  Height:   Ht Readings from Last 1 Encounters:  10/29/19 5' 2"  (1.575 m)    Weight:   Wt Readings from Last 1 Encounters:  10/29/19 52.2 kg    Ideal Body Weight:  50 kg  BMI:  Body mass index is 21.03 kg/m.  Estimated Nutritional Needs:   Kcal:  1600-1800  Protein:  80-95 grams  Fluid:  1000 ml + UOP    Quamesha Mullet A. Jimmye Norman, RD, LDN, [place] Registered Dietitian II Certified Diabetes Care and Education Specialist Pager: [phone] After hours Pager: [phone]

## 2019-10-30 ENCOUNTER — Other Ambulatory Visit: Payer: Self-pay | Admitting: *Deleted

## 2019-10-30 ENCOUNTER — Ambulatory Visit: Payer: Self-pay | Admitting: *Deleted

## 2019-10-30 ENCOUNTER — Other Ambulatory Visit: Payer: Self-pay

## 2019-10-30 ENCOUNTER — Encounter (HOSPITAL_COMMUNITY): Payer: Self-pay | Admitting: Internal Medicine

## 2019-10-30 DIAGNOSIS — T184XXA Foreign body in colon, initial encounter: Secondary | ICD-10-CM

## 2019-10-30 DIAGNOSIS — K50919 Crohn's disease, unspecified, with unspecified complications: Secondary | ICD-10-CM

## 2019-10-30 DIAGNOSIS — K625 Hemorrhage of anus and rectum: Secondary | ICD-10-CM

## 2019-10-30 DIAGNOSIS — J449 Chronic obstructive pulmonary disease, unspecified: Secondary | ICD-10-CM

## 2019-10-30 LAB — CBC WITH DIFFERENTIAL/PLATELET
Abs Immature Granulocytes: 0.06 10*3/uL (ref 0.00–0.07)
Basophils Absolute: 0 10*3/uL (ref 0.0–0.1)
Basophils Relative: 0 %
Eosinophils Absolute: 0 10*3/uL (ref 0.0–0.5)
Eosinophils Relative: 0 %
HCT: 31.7 % — ABNORMAL LOW (ref 36.0–46.0)
Hemoglobin: 10.2 g/dL — ABNORMAL LOW (ref 12.0–15.0)
Immature Granulocytes: 1 %
Lymphocytes Relative: 12 %
Lymphs Abs: 0.7 10*3/uL (ref 0.7–4.0)
MCH: 31.1 pg (ref 26.0–34.0)
MCHC: 32.2 g/dL (ref 30.0–36.0)
MCV: 96.6 fL (ref 80.0–100.0)
Monocytes Absolute: 0.3 10*3/uL (ref 0.1–1.0)
Monocytes Relative: 6 %
Neutro Abs: 4.9 10*3/uL (ref 1.7–7.7)
Neutrophils Relative %: 81 %
Platelets: 103 10*3/uL — ABNORMAL LOW (ref 150–400)
RBC: 3.28 MIL/uL — ABNORMAL LOW (ref 3.87–5.11)
RDW: 17.5 % — ABNORMAL HIGH (ref 11.5–15.5)
WBC: 6 10*3/uL (ref 4.0–10.5)
nRBC: 0 % (ref 0.0–0.2)

## 2019-10-30 LAB — BASIC METABOLIC PANEL
Anion gap: 10 (ref 5–15)
BUN: 33 mg/dL — ABNORMAL HIGH (ref 8–23)
CO2: 22 mmol/L (ref 22–32)
Calcium: 7.6 mg/dL — ABNORMAL LOW (ref 8.9–10.3)
Chloride: 107 mmol/L (ref 98–111)
Creatinine, Ser: 4.64 mg/dL — ABNORMAL HIGH (ref 0.44–1.00)
GFR calc Af Amer: 10 mL/min — ABNORMAL LOW (ref >60)
GFR calc non Af Amer: 8 mL/min — ABNORMAL LOW (ref >60)
Glucose, Bld: 113 mg/dL — ABNORMAL HIGH (ref 70–99)
Potassium: 4.3 mmol/L (ref 3.5–5.1)
Sodium: 139 mmol/L (ref 135–145)

## 2019-10-30 MED ORDER — PANTOPRAZOLE SODIUM 40 MG PO TBEC: 40.0000 mg | DELAYED_RELEASE_TABLET | Freq: Two times a day (BID) | ORAL | 0 refills | Status: AC

## 2019-10-30 MED ORDER — PREDNISONE 20 MG PO TABS: 40.0000 mg | ORAL_TABLET | Freq: Every day | ORAL | 0 refills | Status: AC

## 2019-10-30 MED ORDER — ACETAMINOPHEN 325 MG PO TABS
ORAL_TABLET | ORAL | Status: AC
  Filled 2019-10-30: qty 2

## 2019-10-30 NOTE — Progress Notes (Signed)
Hemodialysis completed without issue. UF 2.4L. Tolerated well. Goal met. Patient did receive tylenol for headache. No further complaints. Currently denies pain. Report called to primary RN.

## 2019-10-30 NOTE — Progress Notes (Signed)
PROGRESS NOTE    LEXINGTON DEVINE  WLS:[id number] DOB: 08/05/1936 DOA: 10/27/2019 PCP: Colon Branch, MD   Brief Narrative:  84 year old female with ESRD on HD, HFpEF, PAF with multiple recent admissions for GI bleed and symptomatic anemia was admitted 10/27/2019 with recurrent GI bleed and symptomatic anemia. Patient recently underwent work-up including bidirectional endoscopy notable for stenosis of the terminal ileum. A tentative diagnosis of Crohn's disease was made and patient was started on prednisone. Her anemia was treated with blood transfusions.  10/30/19: due to recurrent bleeding while inpatient she did have capsule endoscopy 10/29/19 to evaluate SI which had thickening on imaging. Awaiting results. Hg relatively stable, HD done today.   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 -Patient seen by GI who recommend capsule endoscopy done 10/29/19  -Patient is continued on prednisone although this can be reassessed by GI as warranted -GI did not see patient yesterday and results of capsule endoscopy I cannot find  Symptomatic anemia/developing pancytopenia with rising MCV -Feeling okay today with fairly stable Hg 10.2 today from 10.7 yesterday -2 units PRBC this admission -capsule endoscopy 10/29/19 -Also of note, MCV prior to transfusion was 117. -Also of note patient's WBC has been  declining as have her platelets. -Patient is on vitamin B12 and her B12 levels are elevated. -Discussed with Dr. Alvy Bimler who agrees that developing pancytopenia and markedlyMCV warrants a work-up and she recommends outpatient follow-up with her within 1 to 2 weeks after discharge.  ESRD -Tu/Th/Sa with last done 10/30/2019  HTN -Continue carvedilol -Lasix being held given GI bleed  PAF -On carvedilol for rate control -Not on anticoagulation due to ongoing GI bleed  HFpEF -Compensated for now  -Lasix being held as noted above  DVT prophylaxis: SCDs Code Status: DNR Family Communication:patient only Disposition Plan: pending results of capsule endoscopy, stable Hg   Consultants:   GI  Nephrology  Procedures:   HD Tues/Thur/Sat  Antimicrobials:   none  Subjective: Feeling well overall. BM yesterday which she did not look at as she is frustrated with seeing blood in it. Denies chest pains or SOB or coughing. Denies abdominal pain. Had headache in dialysis and given tylenol and feeling better from that. Got fluids in dialysis as well due to low BP.   Objective: Vitals:   10/30/19 0930 10/30/19 1000 10/30/19 1011 10/30/19 1116  BP: (!) 139/28 (!) 135/35 (!) 150/41 (!) 115/48  Pulse: 68 68 70 74  Resp: 17 19 15 16   Temp:   97.6 F (36.4 C) 97.6 F (36.4 C)  TempSrc:   Oral Oral  SpO2:   95% 97%  Weight:   52.5 kg   Height:        Intake/Output Summary (Last 24 hours) at 10/30/2019 1123 Last data filed at 10/30/2019 1011 Gross per 24 hour  Intake --  Output 2400 ml  Net -2400 ml   [employer]   10/29/19  6378 10/30/19 0700 10/30/19 1011  Weight: 52.2 kg 54.7 kg 52.5 kg    Examination:  General exam: Appears calm and comfortable  Respiratory system: Clear to auscultation. Respiratory effort normal. Cardiovascular system: S1 & S2 heard, RRR. No JVD, murmurs, rubs, gallops or clicks. No pedal edema. Gastrointestinal system: Abdomen is nondistended,  soft and nontender. No organomegaly or masses felt. Normal bowel sounds heard. Central nervous system: Alert and oriented. No focal neurological deficits. Extremities: Symmetric 5 x 5 power. Skin: No rashes, lesions or ulcers Psychiatry: Judgement and insight appear normal. Mood & affect appropriate.   Data Reviewed: I have personally reviewed following labs and imaging studies  CBC: Recent Labs  Lab 10/27/19 1656 10/28/19 0709 10/28/19 0848 10/29/19 0211 10/30/19 0531  WBC 5.4 6.1 6.1 4.4 6.0  NEUTROABS  --   --   --   --  4.9  HGB 7.4* 11.4* 11.6* 10.7* 10.2*  HCT 27.1* 34.9* 35.7* 33.3* 31.7*  MCV 117.8* 95.1 95.5 96.8 96.6  PLT 96* 87* 106* 93* 588*   Basic Metabolic Panel: Recent Labs  Lab 10/27/19 1656 10/28/19 0848 10/29/19 0211 10/30/19 0531  NA 139 140 137 139  K 3.0* 4.2 4.5 4.3  CL 98 100 103 107  CO2 32 30 23 22   GLUCOSE 143* 71 169* 113*  BUN 9 16 26* 33*  CREATININE 1.72* 2.65* 3.80* 4.64*  CALCIUM 7.5* 8.0* 7.6* 7.6*   GFR: Estimated Creatinine Clearance: 7.5 mL/min (A) (by C-G formula based on SCr of 4.64 mg/dL (H)). Liver Function Tests: Recent Labs  Lab 10/27/19 1656  AST 24  ALT 19  ALKPHOS 47  BILITOT 0.7  PROT 4.5*  ALBUMIN 1.9*   No results for input(s): LIPASE, AMYLASE in the last 168 hours. No results for input(s): AMMONIA in the last 168 hours. Coagulation Profile: No results for input(s): INR, PROTIME in the last 168 hours. Cardiac Enzymes: No results for input(s): CKTOTAL, CKMB, CKMBINDEX, TROPONINI in the last 168 hours. BNP (last 3 results) No results for input(s): PROBNP in the last 8760 hours. HbA1C: No results for input(s): HGBA1C in the last 72 hours. CBG: Recent Labs  Lab 10/26/19 1105  GLUCAP 125*   Lipid Profile: No results for input(s): CHOL, HDL, LDLCALC, TRIG, CHOLHDL, LDLDIRECT in the last 72 hours. Thyroid Function Tests: No results for input(s): TSH, T4TOTAL, FREET4, T3FREE, THYROIDAB in the last 72  hours. Anemia Panel: No results for input(s): VITAMINB12, FOLATE, FERRITIN, TIBC, IRON, RETICCTPCT in the last 72 hours. Sepsis Labs: No results for input(s): PROCALCITON, LATICACIDVEN in the last 168 hours.  Recent Results (from the past 240 hour(s))  SARS CORONAVIRUS 2 (TAT 6-24 HRS) Nasopharyngeal Nasopharyngeal Swab     Status: None   Collection Time: 10/27/19  7:35 PM   Specimen: Nasopharyngeal Swab  Result Value Ref Range Status   SARS Coronavirus 2 NEGATIVE NEGATIVE Final    Comment: (NOTE) SARS-CoV-2 target nucleic acids are NOT DETECTED. The SARS-CoV-2 RNA is generally detectable in upper and lower respiratory specimens during the acute phase of infection. Negative results do not preclude SARS-CoV-2 infection, do not rule out co-infections with other pathogens, and should not be used as the sole basis for treatment or other patient management decisions. Negative results must be combined with clinical observations, patient history, and epidemiological information. The expected result is Negative. Fact Sheet for Patients: SugarRoll.be Fact Sheet for Healthcare Providers: https://www.woods-mathews.com/ This test is not yet approved or cleared by the [place] FDA and  has  been authorized for detection and/or diagnosis of SARS-CoV-2 by FDA under an Emergency Use Authorization (EUA). This EUA will remain  in effect (meaning this test can be used) for the duration of the COVID-19 declaration under Section 56 4(b)(1) of the Act, 21 U.S.C. section 360bbb-3(b)(1), unless the authorization is terminated or revoked sooner. Performed at [facility], [place] [address] [place], [place] [address]      Radiology Studies: No results found.  Scheduled Meds: . acetaminophen      . allopurinol  100 mg Oral Daily  . vitamin C  500 mg Oral Daily  . atorvastatin  20 mg Oral Daily  . calcium-vitamin D  1 tablet Oral Daily  . carvedilol   6.25 mg Oral BID WC  . Chlorhexidine Gluconate Cloth  6 each Topical Q0600  . cholecalciferol  1,000 Units Oral Daily  . dicyclomine  20 mg Oral BID  . dronabinol  2.5 mg Oral BID AC  . feeding supplement (ENSURE ENLIVE)  237 mL Oral BID BM  . multivitamin with minerals  1 tablet Oral Daily  . pantoprazole (PROTONIX) IV  40 mg Intravenous Q12H  . predniSONE  20 mg Oral QAC breakfast  . vitamin B-12  1,000 mcg Oral Daily   Continuous Infusions: . sodium chloride 100 mL/hr at 10/30/19 0624    LOS: 2 days   Time spent: [place], MD [facility] Pager [phone]  If 7PM-7AM, please contact night-coverage [email or web address] Password [facility] 10/30/2019, 11:23 AM

## 2019-10-30 NOTE — Discharge Summary (Signed)
Physician Discharge Summary  IVAH GIRARDOT JKD:[id number] DOB: 24-Apr-1936 DOA: 10/27/2019  PCP: Colon Branch, MD  Admit date: 10/27/2019 Discharge date: 10/30/2019  Admitted From: home Disposition:  home  Recommendations for Outpatient Follow-up:  1. Follow up with PCP in 1-2 weeks 2. Follow up GI as scheduled 3. Please obtain BMP/CBC in one week 4. Please follow up on the following pending results: none  Home Health:yes, continue from prior to admit Equipment/Devices:none  Discharge Condition:stable CODE STATUS:dnr Diet recommendation: Renal  Brief/Interim Summary: 84 year old female with ESRD on HD, HFpEF, PAF with multiple recent admissions for GI bleed and symptomatic anemia was admitted 10/27/2019 with recurrent GI bleed and symptomatic anemia. Patient recently underwent work-up including bidirectional endoscopy notable for stenosis of the terminal ileum. A tentative diagnosis of Crohn's disease was made and patient was started on prednisone. Her anemia was treated with blood transfusions. She received a total of 2 units PRBC during admission and Hg increased from 7 on admission to 10.2 on discharge which is appropriate. Due to continued bleeding inpatient she had capsule endoscopy which was read day of discharge to be likely SI findings related to crohn's disease. GI recommended increase of prednisone dosing to 40 mg daily and close follow up with primary care for CBC and GI for medication adjustment.   Discharge Diagnoses:  Active Problems:   Diabetes mellitus type 2 with complications (HCC)   HLD (hyperlipidemia)   Essential hypertension   Generalized weakness   Chronic diastolic CHF (congestive heart failure) (HCC)   COPD (chronic obstructive pulmonary disease) (HCC)   Crohn's ileitis, with rectal bleeding (HCC)   Acute blood loss anemia   Symptomatic anemia   ESRD (end stage renal disease) on dialysis [facility])   DNR (do not resuscitate)   Gastrointestinal  hemorrhage   Discharge Instructions  Discharge Instructions    Call MD for:  persistant dizziness or light-headedness   Complete by: As directed    Diet - low sodium heart healthy   Complete by: As directed    Increase activity slowly   Complete by: As directed      Allergies as of 10/30/2019      Reactions   Codeine Nausea And Vomiting   Morphine Nausea And Vomiting   Oxycodone-acetaminophen Nausea And Vomiting   Sulfonamide Derivatives Nausea And Vomiting   Tramadol Hcl Nausea And Vomiting      Medication List    TAKE these medications   acetaminophen 325 MG tablet Commonly known as: TYLENOL Take 2 tablets (650 mg total) by mouth every 6 (six) hours as needed for mild pain, fever or headache (or Fever >/= 101).   allopurinol 100 MG tablet Commonly known as: ZYLOPRIM Take 1 tablet (100 mg total) by mouth daily.   atorvastatin 20 MG tablet Commonly known as: LIPITOR Take 1 tablet (20 mg total) by mouth daily.   CALTRATE 600+D3 PO Take 1 tablet by mouth daily.   carvedilol 6.25 MG tablet Commonly known as: COREG Take 1 tablet (6.25 mg total) by mouth 2 (two) times daily with a meal.   CENTRUM SILVER PO Take 1 tablet by mouth daily.   Darbepoetin Alfa 100 MCG/0.5ML Sosy injection Commonly known as: ARANESP Inject 0.5 mLs (100 mcg total) into the vein every Saturday with hemodialysis.   dicyclomine 10 MG capsule Commonly known as: BENTYL Take 2 capsules (20 mg total) by mouth 2 (two) times daily.   dronabinol 2.5 MG capsule Commonly known as: Marinol Take 1 capsule (2.5 mg  total) by mouth 2 (two) times daily before a meal.   escitalopram 10 MG tablet Commonly known as: Lexapro Take 2 tablets (20 mg total) by mouth daily.   Lasix 40 MG tablet Generic drug: furosemide Take 40 mg by mouth 2 (two) times daily.   loperamide 2 MG tablet Commonly known as: IMODIUM A-D Take 2 mg by mouth 4 (four) times daily as needed for diarrhea or loose stools.    nitroGLYCERIN 2 % ointment Commonly known as: NITROGLYN Apply 1 inch topically every 12 (twelve) hours. Apply 1 inch topically to rectum every 12 hours   ondansetron 4 MG disintegrating tablet Commonly known as: Zofran ODT Take 1 tablet (4 mg total) by mouth 2 (two) times daily as needed for nausea or vomiting.   pantoprazole 40 MG tablet Commonly known as: PROTONIX Take 1 tablet (40 mg total) by mouth 2 (two) times daily. What changed: when to take this   predniSONE 20 MG tablet Commonly known as: DELTASONE Take 2 tablets (40 mg total) by mouth daily with breakfast. What changed:   medication strength  See the new instructions.   vitamin B-12 1000 MCG tablet Commonly known as: CYANOCOBALAMIN Take 1,000 mcg by mouth daily.   vitamin C 500 MG tablet Commonly known as: ASCORBIC ACID Take 500 mg by mouth daily.   Vitamin D3 25 MCG (1000 UT) Caps Take 1 capsule by mouth daily.      Follow-up Information    Irene Shipper, MD Follow up on 11/05/2019.   Specialty: Gastroenterology Why: Go to lab in basement and do blood test and to xray for xray also Contact information: [address] [place] [address] [phone]        Colon Branch, MD. Schedule an appointment as soon as possible for a visit.   Specialty: Internal Medicine Contact information: [address] [place] [address] [phone]          Allergies  Allergen Reactions  . Codeine Nausea And Vomiting  . Morphine Nausea And Vomiting  . Oxycodone-Acetaminophen Nausea And Vomiting  . Sulfonamide Derivatives Nausea And Vomiting  . Tramadol Hcl Nausea And Vomiting    Consultations:  GI  Nephrology   Procedures/Studies: NM PET Image Initial (PI) Skull Base To Thigh  Result Date: 10/26/2019 CLINICAL DATA:  Initial treatment strategy for wall thickening in a loop of mid-distal ileum seen on previous CT. EXAM: NUCLEAR MEDICINE PET SKULL BASE TO THIGH TECHNIQUE: 6.0 mCi  F-18 FDG was injected intravenously. Full-ring PET imaging was performed from the skull base to thigh after the radiotracer. CT data was obtained and used for attenuation correction and anatomic localization. Fasting blood glucose: 125 mg/dl COMPARISON:  CT scan 09/14/2019 FINDINGS: Mediastinal blood pool activity: SUV max 3.0 Liver activity: SUV max NA NECK: Small hypermetabolic focus identified left neck (level II) without a discrete abnormal lymph node visible on noncontrast CT imaging. Incidental CT findings: none CHEST: Bandlike uptake in the deep left anterior chest wall, site of mastectomy, likely related to the prior surgery. No hypermetabolic mediastinal or hilar nodes. No suspicious pulmonary nodules on the CT scan. Incidental CT findings: Heart size upper normal. Coronary artery calcification is evident. Atherosclerotic calcification is noted in the wall of the thoracic aorta. Right IJ dialysis catheter tip is in the lower right atrium. Small bilateral pleural effusions noted. Interstitial and ground-glass opacity in the left apex is similar to cervical spine CT of 05/30/2019, potentially scarring. Architectural distortion and  scarring in the anterior left lung along a staple line suggest prior wedge resection. There is atelectasis in the lower lungs bilaterally. Mosaic attenuation is nonspecific but can be related to air trapping secondary to small airways disease. ABDOMEN/PELVIS: No abnormal hypermetabolic activity within the liver, pancreas, adrenal glands, or spleen. No hypermetabolic lymph nodes in the abdomen or pelvis. Relatively diffuse FDG accumulation is identified in small bowel loops of the abdomen and pelvis, but is more intense in the loop demonstrating abnormal circumferential wall thickening as better characterized on CT scan of 09/14/2019. SUV max = 5.8 Incidental CT findings: Tiny calcified gallstones evident. Bilateral renal atrophy is associated with bilateral renal lesions of varying  size and attenuation. As no hypermetabolic renal lesion is evident, these likely represent a combination of simple and complex cyst. There is abdominal aortic atherosclerosis without aneurysm. Diverticular changes are noted in the left colon. SKELETON: No focal hypermetabolic activity to suggest skeletal metastasis. Incidental CT findings: No worrisome lytic or sclerotic osseous abnormality. IMPRESSION: 1. Hypermetabolism is identified in distal ileal loops, corresponding to abnormal loops seen on previous diagnostic abdomen/pelvis CT. While there is relatively diffuse physiologic small bowel uptake, the distal ileal loops with wall thickening show increased levels over background. Imaging features could be compatible with an infectious/inflammatory etiology or neoplasm. No associated small bowel obstruction. 2. Small hypermetabolic focus identified in the left neck without underlying lymphadenopathy discernible on CT data obtained for attenuation correction. Dedicated CT neck with contrast recommended to further evaluate. 3. Interstitial and ground-glass opacity in the left apex may be radiation fibrosis given the history of left breast cancer. While stable since 05/30/2027, no older comparative imaging studies are available. Consider follow-up CT chest without contrast in 3-6 months to ensure stability. 4. Cholelithiasis. 5. Bilateral renal atrophy with bilateral renal cysts of varying size and attenuation. No hypermetabolic renal lesion evident on PET imaging. 6. Left colonic diverticulosis. 7.  Aortic Atherosclerois (ICD10-170.0) Electronically Signed   By: Misty Stanley M.D.   On: 10/26/2019 12:56   DG Chest Portable 1 View  Result Date: 10/02/2019 CLINICAL DATA:  Dyspnea, weakness EXAM: PORTABLE CHEST 1 VIEW COMPARISON:  09/11/2019 chest radiograph. FINDINGS: Right internal jugular central venous catheter terminates at cavoatrial junction. Surgical clips in the left greater than right axilla, unchanged.  Stable cardiomediastinal silhouette with mild cardiomegaly. No pneumothorax. Possible trace bilateral pleural effusions. No pulmonary edema. No acute consolidative airspace disease. Stable minimal scarring versus atelectasis in the mid to lower left lung. IMPRESSION: 1. Mild cardiomegaly without pulmonary edema. 2. Possible trace bilateral pleural effusions. 3. Stable minimal scarring versus atelectasis in the mid to lower left lung. No acute pulmonary disease. Electronically Signed   By: Ilona Sorrel M.D.   On: 10/02/2019 11:23  Capsule endoscopy:Inflammatory changes consistent with Crohn's disease of the small bowel - presumably in mid ileum  Ulceration, edema and mucosal bridge seen - capseule held up herem then passed, but did not reach colon   Subjective: Feeling well overall, wants to go home. Wants to figure out what is going on with the GI bleeding and problems. No abdominal pain, diarrhea, constipation. No chest pains or SOB or cough.   Discharge Exam: Vitals:   10/30/19 1011 10/30/19 1116  BP: (!) 150/41 (!) 115/48  Pulse: 70 74  Resp: 15 16  Temp: 97.6 F (36.4 C) 97.6 F (36.4 C)  SpO2: 95% 97%   Vitals:   10/30/19 0930 10/30/19 1000 10/30/19 1011 10/30/19 1116  BP: (!) 139/28 Marland Kitchen)  135/35 (!) 150/41 (!) 115/48  Pulse: 68 68 70 74  Resp: 17 19 15 16   Temp:   97.6 F (36.4 C) 97.6 F (36.4 C)  TempSrc:   Oral Oral  SpO2:   95% 97%  Weight:   52.5 kg   Height:        General: Pt is alert, awake, not in acute distress Cardiovascular: RRR, S1/S2 +, no rubs, no gallops Respiratory: CTA bilaterally, no wheezing, no rhonchi Abdominal: Soft, NT, ND, bowel sounds + Extremities: no edema, no cyanosis  The results of significant diagnostics from this hospitalization (including imaging, microbiology, ancillary and laboratory) are listed below for reference.     Microbiology: Recent Results (from the past 240 hour(s))  SARS CORONAVIRUS 2 (TAT 6-24 HRS) Nasopharyngeal  Nasopharyngeal Swab     Status: None   Collection Time: 10/27/19  7:35 PM   Specimen: Nasopharyngeal Swab  Result Value Ref Range Status   SARS Coronavirus 2 NEGATIVE NEGATIVE Final    Comment: (NOTE) SARS-CoV-2 target nucleic acids are NOT DETECTED. The SARS-CoV-2 RNA is generally detectable in upper and lower respiratory specimens during the acute phase of infection. Negative results do not preclude SARS-CoV-2 infection, do not rule out co-infections with other pathogens, and should not be used as the sole basis for treatment or other patient management decisions. Negative results must be combined with clinical observations, patient history, and epidemiological information. The expected result is Negative. Fact Sheet for Patients: SugarRoll.be Fact Sheet for Healthcare Providers: https://www.woods-mathews.com/ This test is not yet approved or cleared by the [place] FDA and  has been authorized for detection and/or diagnosis of SARS-CoV-2 by FDA under an Emergency Use Authorization (EUA). This EUA will remain  in effect (meaning this test can be used) for the duration of the COVID-19 declaration under Section 56 4(b)(1) of the Act, 21 U.S.C. section 360bbb-3(b)(1), unless the authorization is terminated or revoked sooner. Performed at [facility], [place] [address] [place], [place] [address]      Labs: BNP (last 3 results) Recent Labs    04/07/19 2157 08/02/19 2149  BNP 294.1* 308.6*   Basic Metabolic Panel: Recent Labs  Lab 10/27/19 1656 10/28/19 0848 10/29/19 0211 10/30/19 0531  NA 139 140 137 139  K 3.0* 4.2 4.5 4.3  CL 98 100 103 107  CO2 32 30 23 22   GLUCOSE 143* 71 169* 113*  BUN 9 16 26* 33*  CREATININE 1.72* 2.65* 3.80* 4.64*  CALCIUM 7.5* 8.0* 7.6* 7.6*   Liver Function Tests: Recent Labs  Lab 10/27/19 1656  AST 24  ALT 19  ALKPHOS 47  BILITOT 0.7  PROT 4.5*  ALBUMIN 1.9*   No results for  input(s): LIPASE, AMYLASE in the last 168 hours. No results for input(s): AMMONIA in the last 168 hours. CBC: Recent Labs  Lab 10/27/19 1656 10/28/19 0709 10/28/19 0848 10/29/19 0211 10/30/19 0531  WBC 5.4 6.1 6.1 4.4 6.0  NEUTROABS  --   --   --   --  4.9  HGB 7.4* 11.4* 11.6* 10.7* 10.2*  HCT 27.1* 34.9* 35.7* 33.3* 31.7*  MCV 117.8* 95.1 95.5 96.8 96.6  PLT 96* 87* 106* 93* 103*   Cardiac Enzymes: No results for input(s): CKTOTAL, CKMB, CKMBINDEX, TROPONINI in the last 168 hours. BNP: Invalid input(s): POCBNP CBG: Recent Labs  Lab 10/26/19 1105  GLUCAP 125*   D-Dimer No results for input(s): DDIMER in the last 72 hours. Hgb A1c No results for input(s): HGBA1C  in the last 72 hours. Lipid Profile No results for input(s): CHOL, HDL, LDLCALC, TRIG, CHOLHDL, LDLDIRECT in the last 72 hours. Thyroid function studies No results for input(s): TSH, T4TOTAL, T3FREE, THYROIDAB in the last 72 hours.  Invalid input(s): FREET3 Anemia work up No results for input(s): VITAMINB12, FOLATE, FERRITIN, TIBC, IRON, RETICCTPCT in the last 72 hours. Urinalysis    Component Value Date/Time   COLORURINE YELLOW 08/02/2019 2254   APPEARANCEUR CLEAR 08/02/2019 2254   LABSPEC 1.010 08/02/2019 2254   PHURINE 5.0 08/02/2019 2254   GLUCOSEU 50 (A) 08/02/2019 2254   HGBUR NEGATIVE 08/02/2019 2254   BILIRUBINUR NEGATIVE 08/02/2019 2254   KETONESUR NEGATIVE 08/02/2019 2254   PROTEINUR NEGATIVE 08/02/2019 2254   UROBILINOGEN 0.2 12/29/2014 1400   NITRITE NEGATIVE 08/02/2019 2254   LEUKOCYTESUR NEGATIVE 08/02/2019 2254   Sepsis Labs Invalid input(s): PROCALCITONIN,  WBC,  LACTICIDVEN Microbiology Recent Results (from the past 240 hour(s))  SARS CORONAVIRUS 2 (TAT 6-24 HRS) Nasopharyngeal Nasopharyngeal Swab     Status: None   Collection Time: 10/27/19  7:35 PM   Specimen: Nasopharyngeal Swab  Result Value Ref Range Status   SARS Coronavirus 2 NEGATIVE NEGATIVE Final    Comment:  (NOTE) SARS-CoV-2 target nucleic acids are NOT DETECTED. The SARS-CoV-2 RNA is generally detectable in upper and lower respiratory specimens during the acute phase of infection. Negative results do not preclude SARS-CoV-2 infection, do not rule out co-infections with other pathogens, and should not be used as the sole basis for treatment or other patient management decisions. Negative results must be combined with clinical observations, patient history, and epidemiological information. The expected result is Negative. Fact Sheet for Patients: SugarRoll.be Fact Sheet for Healthcare Providers: https://www.woods-mathews.com/ This test is not yet approved or cleared by the [place] FDA and  has been authorized for detection and/or diagnosis of SARS-CoV-2 by FDA under an Emergency Use Authorization (EUA). This EUA will remain  in effect (meaning this test can be used) for the duration of the COVID-19 declaration under Section 56 4(b)(1) of the Act, 21 U.S.C. section 360bbb-3(b)(1), unless the authorization is terminated or revoked sooner. Performed at [facility], [place] [address] [place], [place] [address]     Time coordinating discharge: Over 30 minutes  SIGNED:  Hoyt Koch, MD  [facility] 10/30/2019, 6:44 PM Pager   If 7PM-7AM, please contact night-coverage [email or web address] Password [email or web address]

## 2019-10-30 NOTE — Patient Outreach (Signed)
[place] [facility]) Care Management  10/30/2019  JANNETH KRASNER 1936/01/29 [id number]   Member scheduled for outreach today however she is currently hospitalized.  Will follow up pending discharge, hospital liaisons aware of admission.  Valente David, [place], MSN [place] [phone]

## 2019-10-30 NOTE — Progress Notes (Signed)
  [facility] Progress Note   Dialysis Orders:  TTS - [facility]  4hrs, BFR 400, DFR 800,  EDW 49.5kg, 2K/ 2.25Ca  Access: RU AVG  Heparin None Mircera 225 mcg q2wks - last 10/18/19 Venofer 67m qwk - last 1/16  Assessment/ Plan:   1.  GI Bleed - capsule study.  Thought to be Crohn's disease but now unsure.  Recent PET w/increased uptake in terminal ileum - inflammation vs neoplasm.  On prednisone. Per GI.  2.  ESRD -  On HD TTS.   Seen on HD 133/42 UF 3.5 L (net 3L) RUA AVG  2K bath Appears a little uncomfortable (cold) .           3.  Hypertension/volume  - Blood pressure variable, but mostly well controlled. Continue carvedilol.  4.  Anemia of CKD - Hgb 7.4> 10.7 s/p 2 units pRBC. Continue ESA, due 1/21. See #1   5.  Secondary Hyperparathyroidism -  CCa 9.18. Ordered a  Phos for tomorrow AM. Not on binders or VDRA.  6.  Nutrition - On full liquid diet. Nepro. Vit. 7. Diastolic HF 8. DMT2  Subjective:   C/o being cold, denies f/c/n/v/cp   Objective:   BP (!) 155/27   Pulse 68   Temp 97.9 F (36.6 C) (Oral)   Resp 15   Ht 5' 2"  (1.575 m)   Wt 54.7 kg Comment: STANDING  SpO2 95%   BMI 22.06 kg/m  No intake or output data in the 24 hours ending 10/30/19 0929 Weight change:   Physical Exam: General: Pleasant  female Lungs: CTA bilaterally.  Heart: RRR. No murmur, rubs or gallops.  Abdomen: soft, nontender, +BS Extremities: 2+ edema LUE, 1+ LE no edema w/ no ischemic changes, or open wounds  Neuro: AAOx3. Moves all extremities spontaneously. Dialysis Access: LU AVF +b  Imaging: No results found.  Labs: BMET Recent Labs  Lab 10/27/19 1656 10/28/19 0848 10/29/19 0211 10/30/19 0531  NA 139 140 137 139  K 3.0* 4.2 4.5 4.3  CL 98 100 103 107  CO2 32 30 23 22   GLUCOSE 143* 71 169* 113*  BUN 9 16 26* 33*  CREATININE 1.72* 2.65* 3.80* 4.64*  CALCIUM 7.5* 8.0* 7.6* 7.6*   CBC Recent Labs  Lab 10/28/19 0709 10/28/19 0848 10/29/19 0211  10/30/19 0531  WBC 6.1 6.1 4.4 6.0  NEUTROABS  --   --   --  4.9  HGB 11.4* 11.6* 10.7* 10.2*  HCT 34.9* 35.7* 33.3* 31.7*  MCV 95.1 95.5 96.8 96.6  PLT 87* 106* 93* 103*    Medications:    . acetaminophen      . allopurinol  100 mg Oral Daily  . vitamin C  500 mg Oral Daily  . atorvastatin  20 mg Oral Daily  . calcium-vitamin D  1 tablet Oral Daily  . carvedilol  6.25 mg Oral BID WC  . Chlorhexidine Gluconate Cloth  6 each Topical Q0600  . cholecalciferol  1,000 Units Oral Daily  . dicyclomine  20 mg Oral BID  . dronabinol  2.5 mg Oral BID AC  . feeding supplement (ENSURE ENLIVE)  237 mL Oral BID BM  . multivitamin with minerals  1 tablet Oral Daily  . pantoprazole (PROTONIX) IV  40 mg Intravenous Q12H  . predniSONE  20 mg Oral QAC breakfast  . vitamin B-12  1,000 mcg Oral Daily      JOtelia Santee MD 10/30/2019, 9:29 AM

## 2019-10-30 NOTE — Progress Notes (Addendum)
   Patient Name: Samantha Mckenzie Date of Encounter: 10/30/2019, 4:14 PM    Subjective  Feels ok Wants to go home   Objective  BP (!) 115/48 (BP Location: Right Leg)   Pulse 74   Temp 97.6 F (36.4 C) (Oral)   Resp 16   Ht 5' 2"  (1.575 m)   Wt 52.5 kg   SpO2 97%   BMI 21.17 kg/m  NAD  CBC Latest Ref Rng & Units 10/30/2019 10/29/2019 10/28/2019  WBC 4.0 - 10.5 K/uL 6.0 4.4 6.1  Hemoglobin 12.0 - 15.0 g/dL 10.2(L) 10.7(L) 11.6(L)  Hematocrit 36.0 - 46.0 % 31.7(L) 33.3(L) 35.7(L)  Platelets 150 - 400 K/uL 103(L) 93(L) 106(L)      Assessment and Plan  Crohn's ileitis based upon all available evidence to date (working dx)  ESRD  Anemia of chronic disease  Anemia from bleeding intermittently    OK to DC  Change prednisone to 40 mg qd - stay on that dose until she sees Dr. Scarlette Shorts as scheduled 11/12/2019  CBC at our office on Monday Jan 25 (placed in dc section) Will do KUB that day also to f/u on the capsule  (? Passage)  Needs close f/u CBC and support prn to hopefully avoid admissions  Gatha Mayer, MD, [facility] 10/30/2019 4:20 PM  [phone]

## 2019-10-30 NOTE — Procedures (Signed)
INDICATION: Abnormal small bowel on PET scan - has hx Crohn's ? malignancy  FINDINGS: Inflammatory changes consistent with Crohn's disease of the small bowel - presumably in mid ileum Ulceration, edema and mucosal bridge seen - capseule held up herem then passed, but did not reach colon  Prep adequate  RECOMMENDATIONS:  KUB tomorrow - check for capsule position  Needs long-term treatment - steroids vs other depending upon goals of care

## 2019-10-30 NOTE — Progress Notes (Signed)
Hemodialysis initiated via R AVG using 15g needles x2 without issue. No heparin treatment. Patient currently without complaints. Call bell within reach. Continue to monitor.

## 2019-10-30 NOTE — Progress Notes (Signed)
Manufacturing engineer [facility])  Hospital Liaison note.  This patient is currently enrolled with the [facility] palliative program. [facility] hospital liaison will follow while hospitalized and coordinate with [facility] palliative team to resume service once discharged.   Please call if you have any questions regarding the [facility] palliative or hospice services.  Thank you, Farrel Gordon, RN, [facility] [place]  [phone] ( listed on AMION)

## 2019-10-30 NOTE — Progress Notes (Signed)
Physical Therapy Treatment Patient Details Name: Samantha Mckenzie MRN: [id number] DOB: 08-Aug-1936 Today's Date: 10/30/2019    History of Present Illness Patient is a 84 y/o female who presents with decreased hemoglobin, rectal bleeding and generalized weakness. Awaiting GI follow up. PMH includes depression, breast ca s/p mastectomy/chemo/radiation, chronic anemia, CAD, ESRD on HD TTS.    PT Comments    Pt received resting in bed and eager for PT. Pt is progressing well towards PT goals. Pt required min A to get EOB and transfer to standing. Pt with posterior lean upon initial standing requiring min A and cuing for anterior weight shift to correct. Pt progressed ambulation tolerance with improved gait pattern. Pt mildly unsteady during wide turn. Pt reporting that she felt better yesterday. Pt performed seated LE therex with therapist providing manual resistance. Pt instructed on supine LE therex to perform on her own to continue progressing LE strength and endurance. Pt presents with decreased strength, balance (several falls at home), power and activity tolerance that would benefit from further acute PT to improve in order to maximize functional mobility. Recommendation for HHPT remains appropriate.     Follow Up Recommendations  Home health PT;Supervision for mobility/OOB     Equipment Recommendations  Rolling walker with 5" wheels    Recommendations for Other Services       Precautions / Restrictions Precautions Precautions: Fall Precaution Comments: multiple falls at home Restrictions Weight Bearing Restrictions: No    Mobility  Bed Mobility Overal bed mobility: Needs Assistance Bed Mobility: Supine to Sit     Supine to sit: Min assist;HOB elevated     General bed mobility comments: min A for trunk elevation to get EOB  Transfers Overall transfer level: Needs assistance Equipment used: Rolling walker (2 wheeled) Transfers: Sit to/from Stand Sit to Stand: Min assist         General transfer comment: min A to power to upright standing with RW, posterior lean against bed with initial standing requiring min-mod A for anterior weight shift, required cuing for technique with RW  Ambulation/Gait Ambulation/Gait assistance: Min guard Gait Distance (Feet): 200 Feet Assistive device: Rolling walker (2 wheeled) Gait Pattern/deviations: Step-through pattern;Decreased stride length;Narrow base of support Gait velocity: decreased   General Gait Details: pt ambulated in hallway, mild unsteadiness during wide turn with RW, slow cautious gait pattern, pt reporting feeling better yesterday however appears like today was improved gait pattern with no scissoring noted   Stairs             Wheelchair Mobility    Modified Rankin (Stroke Patients Only)       Balance Overall balance assessment: Needs assistance Sitting-balance support: Feet supported;No upper extremity supported Sitting balance-Leahy Scale: Good Sitting balance - Comments: steady EOB Postural control: Posterior lean Standing balance support: During functional activity;Single extremity supported Standing balance-Leahy Scale: Poor Standing balance comment: requires bil UE support for ambulation, min A to correct posterior lean with initial standing                            Cognition Arousal/Alertness: Awake/alert Behavior During Therapy: WFL for tasks assessed/performed Overall Cognitive Status: No family/caregiver present to determine baseline cognitive functioning                                        Exercises General Exercises - Lower Extremity  Long Arc Quad: AROM;Both;10 reps Hip Flexion/Marching: AROM;Both;10 reps;Seated(against therapist resistance) Toe Raises: AROM;Both;10 reps;Seated Heel Raises: AROM;Both;10 reps;Seated Other Exercises Other Exercises: isometric hip abd/add against therapist resistance x10 reps Other Exercises: pt instructed  on supine LE therex to perform as HEP including hip abd, SLR, heel slides, AP    General Comments General comments (skin integrity, edema, etc.): VSS      Pertinent Vitals/Pain Pain Assessment: No/denies pain    Home Living                      Prior Function            PT Goals (current goals can now be found in the care plan section) Progress towards PT goals: Progressing toward goals    Frequency    Min 3X/week      PT Plan Current plan remains appropriate    Co-evaluation              AM-PAC PT "6 Clicks" Mobility   Outcome Measure  Help needed turning from your back to your side while in a flat bed without using bedrails?: A Little Help needed moving from lying on your back to sitting on the side of a flat bed without using bedrails?: A Little Help needed moving to and from a bed to a chair (including a wheelchair)?: A Little Help needed standing up from a chair using your arms (e.g., wheelchair or bedside chair)?: A Little Help needed to walk in hospital room?: A Little Help needed climbing 3-5 steps with a railing? : A Little 6 Click Score: 18    End of Session Equipment Utilized During Treatment: Gait belt Activity Tolerance: Patient tolerated treatment well Patient left: in chair;with call bell/phone within reach Nurse Communication: Mobility status PT Visit Diagnosis: Muscle weakness (generalized) (M62.81);Unsteadiness on feet (R26.81);Difficulty in walking, not elsewhere classified (R26.2)     Time: 0017-4944 PT Time Calculation (min) (ACUTE ONLY): 24 min  Charges:  $Therapeutic Exercise: 8-22 mins                     Zachary George PT, DPT 4:43 PM,10/30/19    Kaushik Maul Drucilla Chalet 10/30/2019, 4:39 PM

## 2019-10-30 NOTE — Progress Notes (Signed)
Patient received discharge summary. Reviewed all medications with patient. Patient ready for discharge.

## 2019-10-30 NOTE — Discharge Instructions (Signed)
We would like you to keep the upcoming visit on February 1st with GI. Take 40 mg prednisone daily until that time. We have sent in a new prescription for 20 mg tablets (take 2 daily). If you have 10 mg tablets left at home (4 pills daily).   You will need to see you regular doctor for a repeat blood count in 1 week.   If you have worsening bleeding call your doctor immediately.

## 2019-10-30 NOTE — Progress Notes (Signed)
PT Cancellation Note  Pt currently off the unit for hemodialysis and not available for PT treatment. PT will follow up at later date/time as able and pt appropriate.   Zachary George PT, DPT 10:18 AM,10/30/19

## 2019-10-31 ENCOUNTER — Telehealth: Payer: Self-pay | Admitting: *Deleted

## 2019-10-31 ENCOUNTER — Telehealth: Payer: Self-pay | Admitting: Nurse Practitioner

## 2019-10-31 NOTE — Telephone Encounter (Signed)
Attempted calling patient's home phone several times and the line was busy. Contacted patient's husband, [name], and was able to speak with him. I received a message today that patient has discharged from the hospital. Visit scheduled in the am prior to dialysis at 10a, in order to fill her pill box.

## 2019-10-31 NOTE — Telephone Encounter (Signed)
Transition Care Management Follow-up Telephone Call   Date discharged? 10/30/19   How have you been since you were released from the hospital? "not doing well. I keep going to the hospital and no one ever fixes me"   Do you understand why you were in the hospital? yes   Do you understand the discharge instructions? yes   Where were you discharged to? Home w/ husband   Items Reviewed:  Medications reviewed: yes  Allergies reviewed: yes  Dietary changes reviewed: yes  Referrals reviewed: yes   Functional Questionnaire:   Activities of Daily Living (ADLs):   She states they are independent in the following: bathing and hygiene, feeding, continence, grooming, toileting and dressing States they require assistance with the following: using walker   Any transportation issues/concerns?: no   Any patient concerns? no   Confirmed importance and date/time of follow-up visits scheduled yes  Provider Appointment booked with PCP 11/02/19 @1120   Confirmed with patient if condition begins to worsen call PCP or go to the ER.  Patient was given the office number and encouraged to call back with question or concerns.  : yes

## 2019-10-31 NOTE — Telephone Encounter (Signed)
Transition of care contact from inpatient facility  Date of discharge: Date of contact: Method of contact: Phone  Attempted to contact patients to discuss transition of care from inpatient admission. Patient did not answer the phone. Message was left on the patient's voicemail with call back number [phone].

## 2019-11-01 ENCOUNTER — Other Ambulatory Visit: Payer: Self-pay

## 2019-11-01 ENCOUNTER — Telehealth: Payer: Self-pay | Admitting: Nephrology

## 2019-11-01 ENCOUNTER — Other Ambulatory Visit: Payer: Medicare Other | Admitting: *Deleted

## 2019-11-01 ENCOUNTER — Other Ambulatory Visit: Payer: Self-pay | Admitting: *Deleted

## 2019-11-01 DIAGNOSIS — D509 Iron deficiency anemia, unspecified: Secondary | ICD-10-CM | POA: Diagnosis not present

## 2019-11-01 DIAGNOSIS — N186 End stage renal disease: Secondary | ICD-10-CM | POA: Diagnosis not present

## 2019-11-01 DIAGNOSIS — K922 Gastrointestinal hemorrhage, unspecified: Secondary | ICD-10-CM | POA: Diagnosis not present

## 2019-11-01 DIAGNOSIS — Z515 Encounter for palliative care: Secondary | ICD-10-CM

## 2019-11-01 DIAGNOSIS — N2581 Secondary hyperparathyroidism of renal origin: Secondary | ICD-10-CM | POA: Diagnosis not present

## 2019-11-01 DIAGNOSIS — E1129 Type 2 diabetes mellitus with other diabetic kidney complication: Secondary | ICD-10-CM | POA: Diagnosis not present

## 2019-11-01 DIAGNOSIS — D631 Anemia in chronic kidney disease: Secondary | ICD-10-CM | POA: Diagnosis not present

## 2019-11-01 DIAGNOSIS — Z992 Dependence on renal dialysis: Secondary | ICD-10-CM | POA: Diagnosis not present

## 2019-11-01 NOTE — Patient Outreach (Signed)
[facility]) Care Management  11/01/2019  LASHINA MILLES [birth year] [id number]   Noted that member was discharged home from the hospital on 1/19 after being readmitted to hospital for GE bleed.  Primary MD office will complete transition of care assessment.  Call placed to member to follow up on discharge, husband report she is currently at dialysis.  Will follow up next week on non-dialysis day.  Valente David, [place], MSN [place] [phone]

## 2019-11-01 NOTE — Progress Notes (Signed)
COMMUNITY PALLIATIVE CARE RN NOTE  PATIENT NAME: Samantha Mckenzie DOB: 02/03/1936 MRN: [id number]  PRIMARY CARE PROVIDER: Colon Branch, MD  RESPONSIBLE PARTY: Crissie Figures (husband) Acct ID - Guarantor Home Phone Work Phone Relationship Acct Type  [id number] MALINI, [phone]  Self P/F     [address] [place], [place] [address]   Covid-19 Pre-screening Negative  PLAN OF CARE and INTERVENTION:  1. ADVANCE CARE PLANNING/GOALS OF CARE: Goal is for patient to get stronger. She has a DNR. 2. PATIENT/CAREGIVER EDUCATION: Safe Mobility/Transfers, Symptom Management 3. DISEASE STATUS: Met with patient and her husband in their home. Visit made to fill patient's pill box. Patient was hospitalized from 10/27/19 to 10/30/19 for a recurrent GI bleed and symptomatic anemia. She received 2 Units of PRBCs. Her hgb on arrival was 7.0 and increased to 10.2 after her transfusions. She had a capsule endoscopy which from results, appears that her GI bleed is d/t her Crohn's disease. I filled her pill box per hospital discharge instructions. Her Protonix has been increased to 40 mg twice daily and Prednisone to 40 mg daily. She reports continued weakness. She also has some slight dyspnea with conversation and exertion. She requires assistance with bathing and dressing, but remains able to toilet herself d/t elevated toilet seat. She needed my assistance in standing from her Rollator walker today. She is heading to dialysis now and continues on Tues, Thurs, Sat. Will continue to monitor.   HISTORY OF PRESENT ILLNESS:  This is a 84 yo female who resides at home with her husband. Palliative care continues to follow patient. Will continue to visit patient weekly and prn.   CODE STATUS: DNR ADVANCED DIRECTIVES: Y MOST FORM: yes PPS: 50%   PHYSICAL EXAM:   LUNGS: clear to auscultation  CARDIAC: Cor RRR EXTREMITIES: 2+ and pitting edema bilateral legs/ankles/feet, Increased swelling noted to her L  hand/arm (no redness) SKIN: Pale in appearance, thin/frail skin, multiple bruises to bilateral arms  NEURO: Alert and oriented x 3, forgetful, generalized weakness, ambulatory w/walker   (Duration of visit and documentation 45 minutes)   Daryl Eastern, RN BSN

## 2019-11-01 NOTE — Telephone Encounter (Signed)
Transition of Care Contact from [place]  Date of Discharge: 10/30/19 Date of Contact: 11/01/19 Method of contact: phone  Attempted to contact patient to discuss transition of care from inpatient admission due. Patient did not answer the phone.  Will attempt to call them again and if unable to reach will follow up at dialysis.  Jen Mow, PA-C [facility] Pager: [phone]

## 2019-11-02 ENCOUNTER — Ambulatory Visit (INDEPENDENT_AMBULATORY_CARE_PROVIDER_SITE_OTHER): Payer: Medicare Other | Admitting: Internal Medicine

## 2019-11-02 DIAGNOSIS — F329 Major depressive disorder, single episode, unspecified: Secondary | ICD-10-CM

## 2019-11-02 DIAGNOSIS — D649 Anemia, unspecified: Secondary | ICD-10-CM | POA: Diagnosis not present

## 2019-11-02 DIAGNOSIS — N189 Chronic kidney disease, unspecified: Secondary | ICD-10-CM | POA: Diagnosis not present

## 2019-11-02 DIAGNOSIS — F32A Depression, unspecified: Secondary | ICD-10-CM

## 2019-11-02 NOTE — Progress Notes (Signed)
Subjective:    Patient ID: Samantha Mckenzie, female    DOB: 02/08/1936, 84 y.o.   MRN: [id number]  DOS:  11/02/2019 Type of visit - description: Attempted  to make this a video visit, due to technical difficulties from the patient side it was not possible  thus we proceeded with a Virtual Visit via Telephone    I connected with above mentioned patient  by telephone and verified that I am speaking with the correct person using two identifiers.  THIS ENCOUNTER IS A VIRTUAL VISIT DUE TO COVID-19 - PATIENT WAS NOT SEEN IN THE OFFICE. PATIENT HAS CONSENTED TO VIRTUAL VISIT / TELEMEDICINE VISIT   Location of patient: home  Location of provider: office  I discussed the limitations, risks, security and privacy concerns of performing an evaluation and management service by telephone and the availability of in person appointments. I also discussed with the patient that there may be a patient responsible charge related to this service. The patient expressed understanding and agreed to proceed.  TCM Admitted to the hospital and discharged 10/30/2019.  Was admitted with rectal bleeding, hemoglobin on presentation was 7.4. Platelets 96. The patient was symptomatic with fatigue, weakness and occasional shortness of breath.  The hospital, she was transfused and provided supportive care. GI was consulted.  She is now at home, she still reports feeling quite fatigue. Had a fall today trying to get from the garage to home. Denies any head or neck injury, reports no new problems since the fall. Denies fever chills No chest pain Shortness of breath still there, and at baseline Bowel movement still look red.  Apparently they never look completely normal even at the hospital.  Review of Systems See above   Past Medical History:  Diagnosis Date  . Allergic rhinitis   . Anemia   . Breast CA ([place])    surgery, chemo, XRT; had peripheral neuropathy (imbalance at times) after chemo  . Cellulitis LEFT  arm recurrent 12/08/2011  . Chronic renal disease, stage IV ([facility])    Dr. Moshe Cipro  . Depression   . Fatigue   . GERD (gastroesophageal reflux disease)    gastritis, EGD 02/2007  . Gout   . Hyperlipidemia   . Hypertension   . Macular degeneration 2011  . Normal cardiac stress test 12/2014  . Osteoarthritis   . Renal insufficiency   . Syncope 09/12/2019    Past Surgical History:  Procedure Laterality Date  . AV FISTULA PLACEMENT Right 08/10/2019   Procedure: ARTERIOVENOUS (AV) FISTULA CREATION VERSUS GRAFT PLACEMENT;  Surgeon: Angelia Mould, MD;  Location: [place];  Service: Vascular;  Laterality: Right;  . BIOPSY  06/06/2019   Procedure: BIOPSY;  Surgeon: Ladene Artist, MD;  Location: [facility];  Service: Endoscopy;;  . BIOPSY  09/17/2019   Procedure: BIOPSY;  Surgeon: Irving Copas., MD;  Location: [facility] END[facility]COPY;  Service: Gastroenterology;;  . cataracts bilaterally  1 -2012  . COLONOSCOPY WITH PROPOFOL N/A 06/06/2019   Procedure: COLONOSCOPY WITH PROPOFOL;  Surgeon: Ladene Artist, MD;  Location: [facility] ENDOSCOPY;  Service: Endoscopy;  Laterality: N/A;  . COLONOSCOPY WITH PROPOFOL N/A 09/17/2019   Procedure: COLONOSCOPY WITH PROPOFOL;  Surgeon: Rush Landmark Telford Nab., MD;  Location: [place];  Service: Gastroenterology;  Laterality: N/A;  . ESOPHAGOGASTRODUODENOSCOPY (EGD) WITH PROPOFOL N/A 09/17/2019   Procedure: ESOPHAGOGASTRODUODENOSCOPY (EGD) WITH PROPOFOL;  Surgeon: Rush Landmark Telford Nab., MD;  Location: [place];  Service: Gastroenterology;  Laterality: N/A;  . FLEXIBLE SIGMOIDOSCOPY N/A 09/15/2019  Procedure: FLEXIBLE SIGMOIDOSCOPY;  Surgeon: Yetta Flock, MD;  Location: [place];  Service: Gastroenterology;  Laterality: N/A;  . GIVENS CAPSULE STUDY N/A 10/29/2019   Procedure: GIVENS CAPSULE STUDY;  Surgeon: Jerene Bears, MD;  Location: [place];  Service: Gastroenterology;  Laterality: N/A;  . IR FLUORO GUIDE CV LINE RIGHT   08/03/2019  . IR US GUIDE VASC ACCESS RIGHT  08/03/2019  . LUNG BIOPSY  1999   neg  . MASTECTOMY Left 1999   and lymphnodes   . POLYPECTOMY  09/17/2019   Procedure: POLYPECTOMY;  Surgeon: Mansouraty, Telford Nab., MD;  Location: [place];  Service: Gastroenterology;;        Objective:   Physical Exam There were no vitals taken for this visit. This is a telephone virtual visit, she is alert oriented x3, she actually sound pretty good today, speaking in complete sentences, does not seem sad or depressed.    Assessment     Assessment  DM- neuropathy (paresthesias, nl pinprick exam), CKD HTN Hyperlipidemia: Lipitor intolerant? CAD? States see Dr Terrence Dupont, no OV  records. (-) stress test 12-2014 CKD -- sees nephrology Gout GI: Crohn's ileitis DX 05-2019, colonoscopy GERD, nl EGD 01-2015  H/o Depression Leg pain-- RLS vs neuropathy, on gabapentin since 2013, good results H/o anemia --- no iron def , Cscope 2012, normal EGD 01-2015 Breast cancer:  --S/p Surgery (L mastectomy), chemotherapy, XRT --Peripheral neuropathy felt to be due to chemotherapy --Recurrent left arm cellulitis DJD  PLAN Acute on chronic anemia: Admitted to the hospital and discharge 10/30/2019, status post 2 RBCs.  Hemoglobin at discharge 10.2.  Had dialysis yesterday ( they typically check hemoglobin levels). Crohn's ileitis:Multiple recent admissions with GI bleed, extensive work-up including a capsule endoscopy that confirms this suspicious of Crohn's disease, she is closely follow-up by GI, on prednisone.  She still has red stools but apparently they never stop even during the hospital stay. Recommend to follow-up with GI as recommended Kidney failure: Last hemodialysis tomorrow. Anxiety depression, on escitalopram, she is sounds better today. Med compliance: Reports that she is getting assistance getting her medications organized.  In fact this morning they were organized for her and the instructions from  the discharge summary will follow. For now, she will be frequently seen by renal and GI thus we agreed that she will call me as needed.  I discussed the assessment and treatment plan with the patient. The patient was provided an opportunity to ask questions and all were answered. The patient agreed with the plan and demonstrated an understanding of the instructions.   The patient was advised to call back or seek an in-person evaluation if the symptoms worsen or if the condition fails to improve as anticipated.  I provided 33 minutes of non-face-to-face time during this encounter.  Kathlene November, MD

## 2019-11-03 DIAGNOSIS — D631 Anemia in chronic kidney disease: Secondary | ICD-10-CM | POA: Diagnosis not present

## 2019-11-03 DIAGNOSIS — E1129 Type 2 diabetes mellitus with other diabetic kidney complication: Secondary | ICD-10-CM | POA: Diagnosis not present

## 2019-11-03 DIAGNOSIS — N186 End stage renal disease: Secondary | ICD-10-CM | POA: Diagnosis not present

## 2019-11-03 DIAGNOSIS — D509 Iron deficiency anemia, unspecified: Secondary | ICD-10-CM | POA: Diagnosis not present

## 2019-11-03 DIAGNOSIS — N2581 Secondary hyperparathyroidism of renal origin: Secondary | ICD-10-CM | POA: Diagnosis not present

## 2019-11-03 DIAGNOSIS — Z992 Dependence on renal dialysis: Secondary | ICD-10-CM | POA: Diagnosis not present

## 2019-11-03 NOTE — Assessment & Plan Note (Signed)
Acute on chronic anemia: Admitted to the hospital and discharge 10/30/2019, status post 2 RBCs.  Hemoglobin at discharge 10.2.  Had dialysis yesterday ( they typically check hemoglobin levels). Crohn's ileitis:Multiple recent admissions with GI bleed, extensive work-up including a capsule endoscopy that confirms this suspicious of Crohn's disease, she is closely follow-up by GI, on prednisone.  She still has red stools but apparently they never stop even during the hospital stay. Recommend to follow-up with GI as recommended Kidney failure: Last hemodialysis tomorrow. Anxiety depression, on escitalopram, she is sounds better today. Med compliance: Reports that she is getting assistance getting her medications organized.  In fact this morning they were organized for her and the instructions from the discharge summary will follow. For now, she will be frequently seen by renal and GI thus we agreed that she will call me as needed.

## 2019-11-06 ENCOUNTER — Ambulatory Visit: Payer: Medicare Other | Admitting: Internal Medicine

## 2019-11-06 DIAGNOSIS — D631 Anemia in chronic kidney disease: Secondary | ICD-10-CM | POA: Diagnosis not present

## 2019-11-06 DIAGNOSIS — D509 Iron deficiency anemia, unspecified: Secondary | ICD-10-CM | POA: Diagnosis not present

## 2019-11-06 DIAGNOSIS — Z992 Dependence on renal dialysis: Secondary | ICD-10-CM | POA: Diagnosis not present

## 2019-11-06 DIAGNOSIS — N186 End stage renal disease: Secondary | ICD-10-CM | POA: Diagnosis not present

## 2019-11-06 DIAGNOSIS — N2581 Secondary hyperparathyroidism of renal origin: Secondary | ICD-10-CM | POA: Diagnosis not present

## 2019-11-06 DIAGNOSIS — E1129 Type 2 diabetes mellitus with other diabetic kidney complication: Secondary | ICD-10-CM | POA: Diagnosis not present

## 2019-11-07 ENCOUNTER — Other Ambulatory Visit: Payer: Self-pay

## 2019-11-07 ENCOUNTER — Other Ambulatory Visit: Payer: Medicare Other | Admitting: *Deleted

## 2019-11-07 ENCOUNTER — Other Ambulatory Visit: Payer: Self-pay | Admitting: *Deleted

## 2019-11-07 DIAGNOSIS — Z452 Encounter for adjustment and management of vascular access device: Secondary | ICD-10-CM | POA: Diagnosis not present

## 2019-11-07 DIAGNOSIS — Z515 Encounter for palliative care: Secondary | ICD-10-CM

## 2019-11-07 NOTE — Progress Notes (Signed)
COMMUNITY PALLIATIVE CARE RN NOTE  PATIENT NAME: Samantha Mckenzie DOB: December 11, 1935 MRN: [id number]  PRIMARY CARE PROVIDER: Colon Branch, MD  RESPONSIBLE PARTY: Crissie Figures (husband) Acct ID - Guarantor Home Phone Work Phone Relationship Acct Type  [id number] KIRBY, [phone]  Self P/F     [address], [place], [place] [address]   Covid-19 Pre-screening Negative  PLAN OF CARE and INTERVENTION:  1. ADVANCE CARE PLANNING/GOALS OF CARE: Goal is for patient to remain at home with her husband. She has a DNR. 2. PATIENT/CAREGIVER EDUCATION: Symptom Management, Safe Mobility 3. DISEASE STATUS: Met with patient and her husband in their home. Upon arrival, patient is standing up at the kitchen sink washing dishes. She states that even though she feels tired, she tries to do what she can around the home. She is no longer able to perform any household chores or laundry. She does try to walk several times per day using her walker around her home to maintain her strength. She denies pain at this time. She does have bilateral lower extremity edema and left arm swelling. She continues on dialysis three times weekly on Tues, Thurs, Sat. I filled patient's pill box. Refill called into pharmacy for [place]. Each week I visit, she wants to make sure that she is taking something for depression. She had several questions regarding hospice, and asks when is the time for a patient to transition to this service. Hospice education provided. She says that she is tired of dialysis, and is back and forth on how long she feels she will be able to continue going. She is currently on Lexapro daily. She states that she has a counselor coming to her home tomorrow. Although she is asking these type of questions, her mood seemed pleasant during my visit. She reports having an appointment earlier today to get her central venous catheter removed. She has a fistula for dialysis currently being used in her right upper arm.  She states that on Friday, she is supposed to go back to the dialysis center to get fluid removed to help decrease her edema. Will continue to monitor.   HISTORY OF PRESENT ILLNESS: This is a 84 yo female who resides at home with her husband. Palliative care continues to follow patient. Will continue to visit patient weekly and PRN.    CODE STATUS: DNR ADVANCED DIRECTIVES: Y MOST FORM: yes PPS: 50%   PHYSICAL EXAM:   LUNGS: clear to auscultation  CARDIAC: Cor RRR EXTREMITIES: Bilateral lower extremity edema and left arm swelling SKIN: Exposed skin is dry and intact  NEURO: Alert and oriented x 3, forgetful, generalized weakness, ambulatory w/walker   (Duration of visit and documentation 60 minutes)   Daryl Eastern, RN BSN

## 2019-11-07 NOTE — Patient Outreach (Signed)
[facility]) Care Management  11/07/2019  Samantha Mckenzie 07-28-1936 [id number]   Call placed to member to follow up on recent discharge, no answer.  HIPAA compliant voice message left, will follow up within the next 3-4 business days.  Valente David, [place], MSN [place] [phone]

## 2019-11-08 ENCOUNTER — Other Ambulatory Visit: Payer: Self-pay | Admitting: *Deleted

## 2019-11-08 DIAGNOSIS — D631 Anemia in chronic kidney disease: Secondary | ICD-10-CM | POA: Diagnosis not present

## 2019-11-08 DIAGNOSIS — E1129 Type 2 diabetes mellitus with other diabetic kidney complication: Secondary | ICD-10-CM | POA: Diagnosis not present

## 2019-11-08 DIAGNOSIS — N2581 Secondary hyperparathyroidism of renal origin: Secondary | ICD-10-CM | POA: Diagnosis not present

## 2019-11-08 DIAGNOSIS — Z992 Dependence on renal dialysis: Secondary | ICD-10-CM | POA: Diagnosis not present

## 2019-11-08 DIAGNOSIS — D509 Iron deficiency anemia, unspecified: Secondary | ICD-10-CM | POA: Diagnosis not present

## 2019-11-08 DIAGNOSIS — N186 End stage renal disease: Secondary | ICD-10-CM | POA: Diagnosis not present

## 2019-11-08 NOTE — Patient Outreach (Signed)
[facility]) Care Management  11/08/2019  DAYANNE YIU 1936-10-06 [id number]   Case conference completed with multidisciplinary team to discuss management of member's care plan.  Notified team that this care manager has not been able to contact member since her discharge however per chart, [facility] RN has continued to have weekly visits with member.  Will follow up with member within the next 3 days as planned.  Valente David, [place], MSN [place] [phone]

## 2019-11-09 ENCOUNTER — Telehealth: Payer: Self-pay | Admitting: *Deleted

## 2019-11-09 DIAGNOSIS — Z992 Dependence on renal dialysis: Secondary | ICD-10-CM | POA: Diagnosis not present

## 2019-11-09 DIAGNOSIS — N2581 Secondary hyperparathyroidism of renal origin: Secondary | ICD-10-CM | POA: Diagnosis not present

## 2019-11-09 DIAGNOSIS — E8770 Fluid overload, unspecified: Secondary | ICD-10-CM | POA: Diagnosis not present

## 2019-11-09 DIAGNOSIS — N186 End stage renal disease: Secondary | ICD-10-CM | POA: Diagnosis not present

## 2019-11-09 NOTE — Telephone Encounter (Signed)
11:16 am Received a call from a nurse from [facility]. He advised that the doctor was in today and is placing patient on Midodrine to take 30 minutes prior to dialysis to help with low BPs during dialysis. He gave her husband, [name], 3 tablets for the next 3 dialysis treatments. If this medication is effective, then they will order a prescription. He also advised that patient had a fall at home. The husband called the kidney center asking if they had anyone there who could help her up. They advised that they are unable to provide this type of service. EMS was called and assisted patient up prior to her going to the dialysis center today. He denies any injuries, bruises, etc. He will update me if the medication is effective and they prescribe this.

## 2019-11-10 DIAGNOSIS — E1129 Type 2 diabetes mellitus with other diabetic kidney complication: Secondary | ICD-10-CM | POA: Diagnosis not present

## 2019-11-10 DIAGNOSIS — Z992 Dependence on renal dialysis: Secondary | ICD-10-CM | POA: Diagnosis not present

## 2019-11-10 DIAGNOSIS — N2581 Secondary hyperparathyroidism of renal origin: Secondary | ICD-10-CM | POA: Diagnosis not present

## 2019-11-10 DIAGNOSIS — N186 End stage renal disease: Secondary | ICD-10-CM | POA: Diagnosis not present

## 2019-11-10 DIAGNOSIS — D509 Iron deficiency anemia, unspecified: Secondary | ICD-10-CM | POA: Diagnosis not present

## 2019-11-10 DIAGNOSIS — D631 Anemia in chronic kidney disease: Secondary | ICD-10-CM | POA: Diagnosis not present

## 2019-11-12 ENCOUNTER — Ambulatory Visit (INDEPENDENT_AMBULATORY_CARE_PROVIDER_SITE_OTHER): Payer: Medicare Other | Admitting: Internal Medicine

## 2019-11-12 ENCOUNTER — Other Ambulatory Visit: Payer: Self-pay

## 2019-11-12 ENCOUNTER — Other Ambulatory Visit: Payer: Self-pay | Admitting: *Deleted

## 2019-11-12 ENCOUNTER — Encounter: Payer: Self-pay | Admitting: Internal Medicine

## 2019-11-12 ENCOUNTER — Telehealth: Payer: Self-pay

## 2019-11-12 VITALS — HR 66 | Temp 98.0°F

## 2019-11-12 DIAGNOSIS — N186 End stage renal disease: Secondary | ICD-10-CM | POA: Diagnosis not present

## 2019-11-12 DIAGNOSIS — K625 Hemorrhage of anus and rectum: Secondary | ICD-10-CM | POA: Diagnosis not present

## 2019-11-12 DIAGNOSIS — R935 Abnormal findings on diagnostic imaging of other abdominal regions, including retroperitoneum: Secondary | ICD-10-CM | POA: Diagnosis not present

## 2019-11-12 DIAGNOSIS — K50919 Crohn's disease, unspecified, with unspecified complications: Secondary | ICD-10-CM | POA: Diagnosis not present

## 2019-11-12 DIAGNOSIS — Z992 Dependence on renal dialysis: Secondary | ICD-10-CM | POA: Diagnosis not present

## 2019-11-12 DIAGNOSIS — E1129 Type 2 diabetes mellitus with other diabetic kidney complication: Secondary | ICD-10-CM | POA: Diagnosis not present

## 2019-11-12 NOTE — Patient Outreach (Signed)
[facility]) Care Management  11/12/2019  BRENLEE KOSKELA 20-Sep-1936 [id number]   Outreach attempt #3, successful.  Call placed to member to follow up on recent discharge.  State she is still feeling weak, continues to have some bloody stools.  She uses a walker but report needing assistance due to fear for falling.  She confirms that she has restarted home health PT, still has palliative care nurse visiting weekly.  Report having [facility] now, aide is in the home 3 times a week for 3 hours.  Has follow up appointment with GI scheduled for today, report her son is working and her husband will take her.    Denies any urgent concerns at this time, will follow up within the next month.  [facility] CM Care Plan Problem One     Most Recent Value  Care Plan Problem One  Risk for fall related to anemia as evidenced by recent hospitalization for anemia and falls  Role Documenting the Problem One  Care Management [place] for Problem One  Active  [facility] Long Term Goal   Member will not be readmitted to hospital within the next 31 days  [facility] Long Term Goal Start Date  10/15/19 [Not met, date reset]  Inter[facility] for Problem One Long Term Goal  Confirmed with member that home health PT has restarted since discharge.  [facility] CM Short Term Goal #3  Member will report completing PET Scan within the next week  [facility] CM Short Term Goal #3 Start Date  10/15/19  [facility] CM Short Term Goal #3 Met Date  11/12/19    [facility] CM Care Plan Problem Two     Most Recent Value  Care Plan Problem Two  Risk for readmission related to GI bleed as evidenced by recurrent hospitalizations  Role Documenting the Problem Two  Care Management [place] for Problem Two  Active  Interventions for Problem Two Long Term Goal   Most recent discharge papers reviewed with member.  Reminded of symptoms of bleed and when to contact MD  [place]  Member will not be readmitted to hospital within the  next 31 days  [facility] Long Term Goal Start Date  11/12/19  [facility] CM Short Term Goal #1   Member will report attending follow up appointment with GI within the next week  [facility] CM Short Term Goal #1 Start Date  11/12/19  Interventions for Short Term Goal #2   Member reminded of upcoming appointment, confirmed she will have transportation.     Valente David, [place], MSN [place] [phone]

## 2019-11-12 NOTE — Telephone Encounter (Signed)
-----   Message from Irene Shipper, MD sent at 11/12/2019  3:11 PM EST ----- Regarding: Needs plain films of the abdomen Samantha Mckenzie,This patient was in today.  It does not appear that she had her abdominal films as requested and ordered.  I noticed this after she left.  Please contact the patient and arrange for abdominal films to rule out retention of capsule from her capsule endoscopy.  ThanksJP

## 2019-11-12 NOTE — Telephone Encounter (Signed)
Left message for pt to call back  °

## 2019-11-12 NOTE — Progress Notes (Signed)
HISTORY OF PRESENT ILLNESS:  Samantha Mckenzie is a 84 y.o. female with multiple significant medical problems including history of breast cancer, end-stage renal disease on 3 times a week hemodialysis, hypertension, hyperlipidemia, osteoarthritis, and Crohn's disease of the small bowel diagnosed over the past year.  I last saw the patient in this office July 12, 2019 regarding Crohn's ileitis improved on steroids.  Anemia secondary to the same.  Also having problems with weight loss and abdominal pain secondary to the same.  See that dictation.  Since that time she has been in and out of the hospital several times for various reasons.  She has had GI bleeding due to her small bowel Crohn's.  She has chronic multifactorial anemia.  She has had extensive work-up to secure the diagnosis of Crohn's including complete colonoscopy with intubation of the most distal ileum (normal mucosa grossly and microscopically), CT enterography, PET scan, and capsule endoscopy.  She is currently on 40 mg of prednisone daily.  She is accompanied by her husband [name].  Her chief complaint is fatigue and weakness.  She has daily bowel movements, generally formed.  If not she takes Imodium.  Still with some intermittent bleeding.  Last hemoglobin stable at 10.3.  Placed on Marinol and Lexapro to help with appetite, energy, fatigue, and depression.  REVIEW OF SYSTEMS:  All non-GI ROS negative unless otherwise stated in the HPI except for anxiety, arthritis, back pain, visual change, confusion, cough, depression, fatigue, headaches, skin rash, extremity swelling, increased thirst, voice change, shortness of breath  Past Medical History:  Diagnosis Date  . Allergic rhinitis   . Anemia   . Breast CA ([place])    surgery, chemo, XRT; had peripheral neuropathy (imbalance at times) after chemo  . Cellulitis LEFT arm recurrent 12/08/2011  . Chronic renal disease, stage IV (HCC)    Dr. Moshe Cipro  . Depression   . Fatigue   . GERD  (gastroesophageal reflux disease)    gastritis, EGD 02/2007  . Gout   . Hyperlipidemia   . Hypertension   . Macular degeneration 2011  . Normal cardiac stress test 12/2014  . Osteoarthritis   . Renal insufficiency   . Syncope 09/12/2019    Past Surgical History:  Procedure Laterality Date  . AV FISTULA PLACEMENT Right 08/10/2019   Procedure: ARTERIOVENOUS (AV) FISTULA CREATION VERSUS GRAFT PLACEMENT;  Surgeon: Angelia Mould, MD;  Location: [place];  Service: Vascular;  Laterality: Right;  . BIOPSY  06/06/2019   Procedure: BIOPSY;  Surgeon: Ladene Artist, MD;  Location[facility];  Service: Endoscopy;;  . BIOPSY  09/17/2019   Procedure: BIOPSY;  Surgeon: Irving Copas., MD;  Location: [facility];  Service: Gastroenterology;;  . cataracts bilaterally  1 -2012  . COLONOSCOPY WITH PROPOFOL N/A 06/06/2019   Procedure: COLONOSCOPY WITH PROPOFOL;  Surgeon: Ladene Artist, MD;  [facility]: [facility]COPY;  Service: Endoscopy;  Laterality: N/A;  . COLONOSCOPY WITH PROPOFOL N/A 09/17/2019   Procedure: COLONOSCOPY WITH PROPOFOL;  Surgeon: Rush Landmark Telford Nab., MD;  Location: [place];  Service: Gastroenterology;  Laterality: N/A;  . ESOPHAGOGASTRODUODENOSCOPY (EGD) WITH PROPOFOL N/A 09/17/2019   Procedure: ESOPHAGOGASTRODUODENOSCOPY (EGD) WITH PROPOFOL;  Surgeon: Rush Landmark Telford Nab., MD;  Location: [place];  Service: Gastroenterology;  Laterality: N/A;  . FLEXIBLE SIGMOIDOSCOPY N/A 09/15/2019   Procedure: FLEXIBLE SIGMOIDOSCOPY;  Surgeon: Yetta Flock, MD;  Location: [place];  Service: Gastroenterology;  Laterality: N/A;  . GIVENS CAPSULE STUDY N/A 10/29/2019   Procedure: GIVENS CAPSULE STUDY;  Surgeon: Jerene Bears, MD;  Location: [facility] ENDOSCOPY;  Service: Gastroenterology;  Laterality: N/A;  . IR FLUORO GUIDE CV LINE RIGHT  08/03/2019  . IR US GUIDE VASC ACCESS RIGHT  08/03/2019  . LUNG BIOPSY  1999   neg  . MASTECTOMY Left 1999   and lymphnodes    . POLYPECTOMY  09/17/2019   Procedure: POLYPECTOMY;  Surgeon: Mansouraty, Telford Nab., MD;  Location: [place];  Service: Gastroenterology;;    Social History [place]  reports that she has never smoked. She has never used smokeless tobacco. She reports previous alcohol use. She reports that she does not use drugs.  family history includes Breast cancer in an other family member; Diabetes in her sister; Heart attack (age of onset: 46) in her father; Hypertension in her sister.  Allergies  Allergen Reactions  . Codeine Nausea And Vomiting  . Morphine Nausea And Vomiting  . Oxycodone-Acetaminophen Nausea And Vomiting  . Sulfonamide Derivatives Nausea And Vomiting  . Tramadol Hcl Nausea And Vomiting       PHYSICAL EXAMINATION: Vital signs: Pulse 66   Temp 98 F (36.7 C)   Constitutional: Chronically ill-appearing, in wheelchair, no acute distress Psychiatric: alert and oriented x3, cooperative Eyes: extraocular movements intact, anicteric, conjunctiva pink Mouth: oral pharynx moist, no lesions Neck: supple no lymphadenopathy Cardiovascular: heart regular rate and rhythm, no murmur Lungs: clear to auscultation bilaterally Abdomen: soft, nontender, nondistended, no obvious ascites, no peritoneal signs, normal bowel sounds, no organomegaly Rectal: Omitted Extremities: no clubbing or cyanosis.  1+ lower extremity edema bilaterally.  Marked edema of the left upper extremity throughout Skin: no lesions on visible extremities Neuro: No focal deficits.  Cranial nerves intact  ASSESSMENT:  1.  Small bowel Crohn's.  On prednisone.  Problems with abdominal pain have resolved.  Still with intermittent bleeding 2.  Chronic multifactorial anemia.  Stable 3.  Weakness, fatigue multifactorial 4.  End-stage renal disease on dialysis   PLAN:  1.  Continue prednisone 40 mg daily.  Hopefully can begin to taper at the time of next visit. 2.  Continue dialysis and renal care per  nephrology 3.  Did not have follow-up films after capsule endoscopy.  We will arrange 4.  Would like to see the patient in 4 weeks.  She requests a virtual visit.  Okay with me A total of 30 minutes spent preparing to see the patient, reviewing a myriad of procedure and test results from her hospitalizations, hospital records, obtaining history, performing comprehensive medical examination today, counseling regarding her Crohn's disease, chronic anemia, and generalized complaints, ordering appropriate medications and tests including follow-up x-rays, and documenting clinical information in the health record

## 2019-11-12 NOTE — Patient Instructions (Signed)
Continue on Prednisone.  Please follow up with a telephone visit on _________________

## 2019-11-13 ENCOUNTER — Emergency Department (HOSPITAL_COMMUNITY): Payer: Medicare Other

## 2019-11-13 ENCOUNTER — Encounter (HOSPITAL_COMMUNITY): Payer: Self-pay | Admitting: Emergency Medicine

## 2019-11-13 ENCOUNTER — Emergency Department (HOSPITAL_COMMUNITY)
Admission: EM | Admit: 2019-11-13 | Discharge: 2019-11-14 | Disposition: A | Discharge status: Home / Self Care | Payer: Medicare Other | Attending: Emergency Medicine | Admitting: Emergency Medicine

## 2019-11-13 ENCOUNTER — Other Ambulatory Visit: Payer: Self-pay

## 2019-11-13 DIAGNOSIS — R0602 Shortness of breath: Secondary | ICD-10-CM | POA: Diagnosis not present

## 2019-11-13 DIAGNOSIS — R519 Headache, unspecified: Secondary | ICD-10-CM | POA: Diagnosis not present

## 2019-11-13 DIAGNOSIS — I120 Hypertensive chronic kidney disease with stage 5 chronic kidney disease or end stage renal disease: Secondary | ICD-10-CM | POA: Diagnosis not present

## 2019-11-13 DIAGNOSIS — N186 End stage renal disease: Secondary | ICD-10-CM | POA: Insufficient documentation

## 2019-11-13 DIAGNOSIS — Z20822 Contact with and (suspected) exposure to covid-19: Secondary | ICD-10-CM | POA: Insufficient documentation

## 2019-11-13 DIAGNOSIS — Z992 Dependence on renal dialysis: Secondary | ICD-10-CM | POA: Insufficient documentation

## 2019-11-13 DIAGNOSIS — R531 Weakness: Secondary | ICD-10-CM | POA: Diagnosis not present

## 2019-11-13 DIAGNOSIS — E1122 Type 2 diabetes mellitus with diabetic chronic kidney disease: Secondary | ICD-10-CM | POA: Insufficient documentation

## 2019-11-13 DIAGNOSIS — F0390 Unspecified dementia without behavioral disturbance: Secondary | ICD-10-CM | POA: Insufficient documentation

## 2019-11-13 DIAGNOSIS — Z79899 Other long term (current) drug therapy: Secondary | ICD-10-CM | POA: Insufficient documentation

## 2019-11-13 DIAGNOSIS — D509 Iron deficiency anemia, unspecified: Secondary | ICD-10-CM | POA: Diagnosis not present

## 2019-11-13 DIAGNOSIS — D631 Anemia in chronic kidney disease: Secondary | ICD-10-CM | POA: Diagnosis not present

## 2019-11-13 DIAGNOSIS — E1129 Type 2 diabetes mellitus with other diabetic kidney complication: Secondary | ICD-10-CM | POA: Diagnosis not present

## 2019-11-13 DIAGNOSIS — N2581 Secondary hyperparathyroidism of renal origin: Secondary | ICD-10-CM | POA: Diagnosis not present

## 2019-11-13 LAB — BASIC METABOLIC PANEL
Anion gap: 12 (ref 5–15)
BUN: 23 mg/dL (ref 8–23)
CO2: 30 mmol/L (ref 22–32)
Calcium: 7.6 mg/dL — ABNORMAL LOW (ref 8.9–10.3)
Chloride: 97 mmol/L — ABNORMAL LOW (ref 98–111)
Creatinine, Ser: 3.16 mg/dL — ABNORMAL HIGH (ref 0.44–1.00)
GFR calc Af Amer: 15 mL/min — ABNORMAL LOW (ref >60)
GFR calc non Af Amer: 13 mL/min — ABNORMAL LOW (ref >60)
Glucose, Bld: 100 mg/dL — ABNORMAL HIGH (ref 70–99)
Potassium: 3.2 mmol/L — ABNORMAL LOW (ref 3.5–5.1)
Sodium: 139 mmol/L (ref 135–145)

## 2019-11-13 LAB — CBC
HCT: 30.5 % — ABNORMAL LOW (ref 36.0–46.0)
Hemoglobin: 9.7 g/dL — ABNORMAL LOW (ref 12.0–15.0)
MCH: 31.4 pg (ref 26.0–34.0)
MCHC: 31.8 g/dL (ref 30.0–36.0)
MCV: 98.7 fL (ref 80.0–100.0)
Platelets: 75 10*3/uL — ABNORMAL LOW (ref 150–400)
RBC: 3.09 MIL/uL — ABNORMAL LOW (ref 3.87–5.11)
RDW: 20.1 % — ABNORMAL HIGH (ref 11.5–15.5)
WBC: 6.1 10*3/uL (ref 4.0–10.5)
nRBC: 0 % (ref 0.0–0.2)

## 2019-11-13 LAB — RESPIRATORY PANEL BY RT PCR (FLU A&B, COVID)
Influenza A by PCR: NEGATIVE
Influenza B by PCR: NEGATIVE
SARS Coronavirus 2 by RT PCR: NEGATIVE

## 2019-11-13 MED ORDER — SODIUM CHLORIDE 0.9% FLUSH: 3.0000 mL | Freq: Once | INTRAVENOUS | Status: DC

## 2019-11-13 MED ORDER — BUTALBITAL-APAP-CAFFEINE 50-325-40 MG PO TABS
1.0000 | ORAL_TABLET | Freq: Once | ORAL | Status: AC
  Administered 2019-11-13: 20:00:00 1 via ORAL
  Filled 2019-11-13: qty 1

## 2019-11-13 NOTE — ED Notes (Signed)
IV team unable to get access. Dr. Wilson Singer notified.

## 2019-11-13 NOTE — Progress Notes (Signed)
CSW was informed by Dr. Wilson Singer that discharge planningwill be moving forward with SNF placement. Pt has PASSR # in [facility] handoff notes. CSW will update [facility] casemanager via chat.

## 2019-11-13 NOTE — ED Provider Notes (Signed)
[place] EMERGENCY DEPARTMENT Provider Note   CSN: [id number] Arrival date & time: 11/13/19  1656     History Chief Complaint  Patient presents with  . Weakness  . Headache  . Shortness of Breath    Samantha Mckenzie is a 84 y.o. female.  HPI   84 year old female with generalized weakness.  Extensive past medical history including end-stage renal disease on dialysis, Crohn's disease of the small bowel diagnosed in the past year after extensive work-up, hypertension, breast cancer, hyperlipidemia and osteoarthritis.  She has had issues with multifactorial anemia including related to her Crohn's.  She is currently on prednisone for this.  She feels like she has no energy in her body is very weak.  No focality.  Mild frontal headache since last night.  Some shortness of breath and occasional cough.  She has chronic edema in her lower extremities and in her left arm.  This fluctuates and currently "not too bad."  She has had a poor appetite.  Recently started on Marinol which is helped.  She states that she has no acute pain aside from the headache.  No vomiting or diarrhea.  She makes minimal urine. No fever. No sick contacts that she is aware of.   Past Medical History:  Diagnosis Date  . Allergic rhinitis   . Anemia   . Breast CA ([place])    surgery, chemo, XRT; had peripheral neuropathy (imbalance at times) after chemo  . Cellulitis LEFT arm recurrent 12/08/2011  . Chronic renal disease, stage IV (HCC)    Dr. Moshe Cipro  . Depression   . Fatigue   . GERD (gastroesophageal reflux disease)    gastritis, EGD 02/2007  . Gout   . Hyperlipidemia   . Hypertension   . Macular degeneration 2011  . Normal cardiac stress test 12/2014  . Osteoarthritis   . Renal insufficiency   . Syncope 09/12/2019    Patient Active Problem List   Diagnosis Date Noted  . Gastrointestinal hemorrhage 10/27/2019  . Lower GI bleed 10/02/2019  . Atrial fibrillation with rapid  ventricular response ([place])   . Syncope 09/15/2019  . Rectal bleeding   . Malnutrition of moderate degree 09/14/2019  . DNR (do not resuscitate) 09/13/2019  . Symptomatic anemia 09/11/2019  . ESRD (end stage renal disease) on dialysis ([place])   . GI bleed   . Cellulitis of arm, left 08/02/2019  . Acidosis   . Dementia ([place]) 06/17/2019  . Abnormal CT scan, small bowel   . Dehydration 05/31/2019  . Acute blood loss anemia   . Crohn's ileitis, with rectal bleeding ([place]) 05/30/2019  . Terminal ileitis with complication ([place]) 30/16/0109  . COPD (chronic obstructive pulmonary disease) ([place]) 04/12/2019  . Crohn's disease of small intestine with rectal bleeding ([place]) 04/07/2019  . Chronic diastolic CHF (congestive heart failure) ([place]) 04/07/2019  . Hypokalemia 04/07/2019  . Hypocalcemia 04/07/2019  . Fall 04/07/2019  . Occult blood positive stool 04/07/2019  . Adjustment disorder with depressed mood   . Generalized abdominal pain   . Dysphasia 03/21/2019  . CAP (community acquired pneumonia) 03/20/2019  . Nausea vomiting and diarrhea 03/20/2019  . Acute respiratory failure with hypoxia ([place]) 03/20/2019  . Acute renal failure superimposed on stage 4 chronic kidney disease ([place]) 03/20/2019  . Lactic acidosis 03/20/2019  . Diabetic peripheral neuropathy associated with type 2 diabetes mellitus ([place]) 03/20/2019  . Hypomagnesemia 06/21/2018  . Generalized weakness 06/20/2018  . PCP NOTES >>>>>>>>>> 07/11/2015  . Sepsis ([place])  04/19/2013  . Depression   . VITAMIN B12 DEFICIENCY 04/01/2008  . GERD (gastroesophageal reflux disease) 12/19/2007  . NEOP, MALIGNANT, FEMALE BREAST NOS 07/07/2007  . Osteoarthritis 07/07/2007  . Diabetes mellitus type 2 with complications ([place]) 62/56/3893  . HLD (hyperlipidemia) 02/28/2007  . Gout 02/28/2007  . Macrocytic anemia 02/28/2007  . Essential hypertension 02/28/2007    Past Surgical History:  Procedure Laterality Date  . AV FISTULA PLACEMENT Right  08/10/2019   Procedure: ARTERIOVENOUS (AV) FISTULA CREATION VERSUS GRAFT PLACEMENT;  Surgeon: Angelia Mould, MD;  Location: [place];  Service: Vascular;  Laterality: Right;  . BIOPSY  06/06/2019   Procedure: BIOPSY;  Surgeon: Ladene Artist, MD;  [facility];  Service: Endoscopy;;  . BIOPSY  09/17/2019   Procedure: BIOPSY;  Surgeon: Irving Copas., MD;  Location: [facility] ENDOSCOPY;  Service: Gastroenterology;;  . cataracts bilaterally  1 -2012  . COLONOSCOPY WITH PROPOFOL N/A 06/06/2019   Procedure: COLONOSCOPY WITH PROPOFOL;  Surgeon: Ladene Artist, MD;  [facility];  Service: Endoscopy;  Laterality: N/A;  . COLONOSCOPY WITH PROPOFOL N/A 09/17/2019   Procedure: COLONOSCOPY WITH PROPOFOL;  Surgeon: Rush Landmark Telford Nab., MD;  Location: [place];  Service: Gastroenterology;  Laterality: N/A;  . ESOPHAGOGASTRODUODENOSCOPY (EGD) WITH PROPOFOL N/A 09/17/2019   Procedure: ESOPHAGOGASTRODUODENOSCOPY (EGD) WITH PROPOFOL;  Surgeon: Rush Landmark Telford Nab., MD;  Location: [place];  Service: Gastroenterology;  Laterality: N/A;  . FLEXIBLE SIGMOIDOSCOPY N/A 09/15/2019   Procedure: FLEXIBLE SIGMOIDOSCOPY;  Surgeon: Yetta Flock, MD;  Location: [place];  Service: Gastroenterology;  Laterality: N/A;  . GIVENS CAPSULE STUDY N/A 10/29/2019   Procedure: GIVENS CAPSULE STUDY;  Surgeon: Jerene Bears, MD;  Location: [place];  Service: Gastroenterology;  Laterality: N/A;  . IR FLUORO GUIDE CV LINE RIGHT  08/03/2019  . IR US GUIDE VASC ACCESS RIGHT  08/03/2019  . LUNG BIOPSY  1999   neg  . MASTECTOMY Left 1999   and lymphnodes   . POLYPECTOMY  09/17/2019   Procedure: POLYPECTOMY;  Surgeon: Mansouraty, Telford Nab., MD;  Location: [facility] ENDOSCOPY;  Service: Gastroenterology;;     OB History   No obstetric history on file.     Family History  Problem Relation Age of Onset  . Breast cancer Other        ? of   . Heart attack Father 43  . Diabetes  Sister        2  . Hypertension Sister   . Colon cancer Neg Hx   . Stomach cancer Neg Hx     Social History   Tobacco Use  . Smoking status: Never Smoker  . Smokeless tobacco: Never Used  Substance Use Topics  . Alcohol use: Not Currently    Alcohol/week: 0.0 standard drinks    Comment: rarely  . Drug use: No    Home Medications Prior to Admission medications   Medication Sig Start Date End Date Taking? Authorizing Provider  acetaminophen (TYLENOL) 325 MG tablet Take 2 tablets (650 mg total) by mouth every 6 (six) hours as needed for mild pain, fever or headache (or Fever >/= 101). 04/12/19   Emokpae, Courage, MD  allopurinol (ZYLOPRIM) 100 MG tablet Take 1 tablet (100 mg total) by mouth daily. 06/29/19   [place]an[place] Lewis C, PA-C  atorvastatin (LIPITOR) 20 MG tablet Take 1 tablet (20 mg total) by mouth daily. 06/29/19   Wille Celeste, PA-C  Calcium Carb-Cholecalciferol (CALTRATE 600+D3 PO) Take 1 tablet by mouth daily.  [provider]  carvedilol (COREG) 6.25 MG tablet Take 1 tablet (6.25 mg total) by mouth 2 (two) times daily with a meal. 09/10/19 11/12/19  Colon Branch, MD  Cholecalciferol (VITAMIN D3) 1000 UNITS CAPS Take 1 capsule by mouth daily.     [provider]  Darbepoetin Alfa (ARANESP) 100 MCG/0.5ML SOSY injection Inject 0.5 mLs (100 mcg total) into the vein every Saturday with hemodialysis. 08/11/19   Cherylann Ratel A, DO  dicyclomine (BENTYL) 10 MG capsule Take 2 capsules (20 mg total) by mouth 2 (two) times daily. 10/18/19   Colon Branch, MD  dronabinol (MARINOL) 2.5 MG capsule Take 1 capsule (2.5 mg total) by mouth 2 (two) times daily before a meal. 10/23/19   Colon Branch, MD  escitalopram (LEXAPRO) 10 MG tablet Take 2 tablets (20 mg total) by mouth daily. 07/06/19   Colon Branch, MD  LASIX 40 MG tablet Take 40 mg by mouth 2 (two) times daily. 08/23/19   [provider]  loperamide (IMODIUM A-D) 2 MG tablet Take 2 mg by mouth 4 (four) times daily as  needed for diarrhea or loose stools.    [provider]  Multiple Vitamins-Minerals (CENTRUM SILVER PO) Take 1 tablet by mouth daily.     [provider]  nitroGLYCERIN (NITROGLYN) 2 % ointment Apply 1 inch topically every 12 (twelve) hours. Apply 1 inch topically to rectum every 12 hours 09/18/19   Patrecia Pour, MD  ondansetron (ZOFRAN ODT) 4 MG disintegrating tablet Take 1 tablet (4 mg total) by mouth 2 (two) times daily as needed for nausea or vomiting. 10/18/19   Colon Branch, MD  pantoprazole (PROTONIX) 40 MG tablet Take 1 tablet (40 mg total) by mouth 2 (two) times daily. 10/30/19   Hoyt Koch, MD  predniSONE (DELTASONE) 20 MG tablet Take 2 tablets (40 mg total) by mouth daily with breakfast. 10/30/19   Hoyt Koch, MD  vitamin B-12 (CYANOCOBALAMIN) 1000 MCG tablet Take 1,000 mcg by mouth daily.      [provider]  vitamin C (ASCORBIC ACID) 500 MG tablet Take 500 mg by mouth daily.    [provider]    Allergies    Codeine, Morphine, Oxycodone-acetaminophen, Sulfonamide derivatives, and Tramadol hcl  Review of Systems   Review of Systems All systems reviewed and negative, other than as noted in HPI.  Physical Exam Updated Vital Signs BP (!) 105/40   Pulse 64   Temp (!) 97.5 F (36.4 C) (Oral)   Resp 12   SpO2 100%   Physical Exam Vitals and nursing note reviewed.  Constitutional:      General: She is not in acute distress.    Appearance: She is well-developed.     Comments: Laying in bed.  Appears tired, but nontoxic.  HENT:     Head: Normocephalic and atraumatic.  Eyes:     General:        Right eye: No discharge.        Left eye: No discharge.     Conjunctiva/sclera: Conjunctivae normal.  Cardiovascular:     Rate and Rhythm: Normal rate and regular rhythm.     Heart sounds: Normal heart sounds. No murmur. No friction rub. No gallop.      Comments: We reviewed fistula right upper extremity with palpable  thrill Pulmonary:     Effort: Pulmonary effort is normal. No respiratory distress.     Breath sounds: Normal breath sounds.  Abdominal:  General: There is no distension.     Palpations: Abdomen is soft.     Tenderness: There is no abdominal tenderness.  Musculoskeletal:        General: No tenderness.     Cervical back: Neck supple.     Right lower leg: Edema present.     Left lower leg: Edema present.     Comments: Marked edema of the left upper extremity.  Symmetric pitting bilateral lower extremity edema.  Skin:    General: Skin is warm and dry.  Neurological:     Mental Status: She is alert.     Cranial Nerves: No cranial nerve deficit.     Motor: No weakness.     Coordination: Coordination normal.     ED Results / Procedures / Treatments   Labs (all labs ordered are listed, but only abnormal results are displayed) Labs Reviewed  RESPIRATORY PANEL BY RT PCR (FLU A&B, COVID)  BASIC METABOLIC PANEL  CBC  URINALYSIS, ROUTINE W REFLEX MICROSCOPIC    EKG EKG Interpretation  Date/Time:  Tuesday November 13 2019 17:08:13 EST Ventricular Rate:  78 PR Interval:  148 QRS Duration: 64 QT Interval:  402 QTC Calculation: 458 R Axis:   106 Text Interpretation: Normal sinus rhythm Rightward axis Anterior infarct , age undetermined Abnormal ECG Artifact Confirmed by Virgel Manifold [phone]) on 11/13/2019 8:36:24 PM   Radiology DG Chest 2 View  Result Date: 11/13/2019 CLINICAL DATA:  Shortness of breath. EXAM: CHEST - 2 VIEW COMPARISON:  October 02, 2019 FINDINGS: The right internal jugular venous catheter seen on the prior study has been removed. Mild atelectasis and/or infiltrate is suspected within the left lung base. There is a small left pleural effusion. This is increased in size when compared to the prior study. No pneumothorax is identified. The cardiac silhouette is mildly enlarged. Radiopaque surgical clips are seen along the lateral aspect of the mid to upper left chest  wall. The visualized skeletal structures are unremarkable. IMPRESSION: 1. Mild left basilar atelectasis and/or infiltrate. 2. Small left pleural effusion, increased in size when compared to the prior study dated October 02, 2019. Electronically Signed   By: Virgina Norfolk M.D.   On: 11/13/2019 18:06    Procedures Procedures (including critical care time)  Medications Ordered in ED Medications  sodium chloride flush (NS) 0.9 % injection 3 mL (has no administration in time range)  butalbital-acetaminophen-caffeine (FIORICET) 50-325-40 MG per tablet 1 tablet (1 tablet Oral Given 11/13/19 1949)    ED Course  I have reviewed the triage vital signs and the nursing notes.  Pertinent labs & imaging results that were available during my care of the patient were reviewed by me and considered in my medical decision making (see chart for details).    MDM Rules/Calculators/A&P                      84 year old female with generalized weakness.  Neuro exam is nonfocal.  Headache.  No nuchal rigidity.  Afebrile.  Per review of records, recent notes of mention generalized weakness, poor appetite,etc  Seems like she has become increasingly weaker/deconditioned.  She lives at home with her husband.  She is progressing to the point where he is having increasing difficulty helping her with ADLs.    My name initial impression that this is a failure to thrive type of picture.  She does have a history of anemia and will need some basic labs checked.  Blood pressure soft, but  this appears to be close to her baseline.  She is already getting pretty extensive home health services.    Final Clinical Impression(s) / ED Diagnoses Final diagnoses:  Generalized weakness    Rx / DC Orders ED Discharge Orders    None       Virgel Manifold, MD 11/18/19 1527

## 2019-11-13 NOTE — ED Notes (Signed)
Pt transported to CT ?

## 2019-11-13 NOTE — Social Work (Signed)
EDCSW spoke with Dr. Wilson Singer to inform him that Pt already has extensive [place] set up through [facility].   TOC team will continue follow up if SNF placement is required.  CSW alerted [facility] care manager that Pt had presented in ED.

## 2019-11-13 NOTE — ED Triage Notes (Signed)
C/o SOB, generalized weakness, and headache since last night.  Pt finished dialysis today.

## 2019-11-13 NOTE — Progress Notes (Signed)
Assessed for venous access.  Both arms restricted.  Right arm restricted for HD.  Left arm restricted secondary due to breast CA and very swollen.  No suitable place to place a PIV.

## 2019-11-14 DIAGNOSIS — R531 Weakness: Secondary | ICD-10-CM | POA: Diagnosis not present

## 2019-11-14 NOTE — Discharge Planning (Signed)
Pt currently active with [facility] for [facility] services.

## 2019-11-14 NOTE — ED Notes (Signed)
From SW/CSM standpoint pt OK for discharge, PT states patient could benefit from SNF placement. Spoke with husband and son at length. Patient and husband refuse SNF and husband states he will be taking care of patient @ home. States he is responsible for taking care of patient @ home getting patient to and from dialysis, he feels OK with continuing to do so. Son concerned with hgb and questioning if patient needs transfusion before discharge. Told son hgb is stable and patient does not require a transfusion at this time.  PTAR was offered but husband and patient refused. States he will be able to get patient into home safely. Husband en route to take patient home.

## 2019-11-14 NOTE — Evaluation (Signed)
Physical Therapy Evaluation Patient Details Name: Samantha Mckenzie MRN: [id number] DOB: 1936/02/07 Today's Date: 11/14/2019   History of Present Illness  Pt is an 84 y/o female admitted secondary to worsening SOB and weakness. CT of head negative for acute abnormality. PMH includes ESRD on HD, breast cancer, chron's disease, and HTN.   Clinical Impression  Pt admitted secondary to problem above with deficits below. Pt requiring mod A +2 to stand and take side steps at EOB. Pt with increased weakness and reports multiple falls at home. Feel she would benefit from SNF level therapies at d/c. Will continue to follow acutely to maximize functional mobility independence and safety.     Follow Up Recommendations SNF;Supervision/Assistance - 24 hour    Equipment Recommendations  None recommended by PT    Recommendations for Other Services       Precautions / Restrictions Precautions Precautions: Fall Precaution Comments: multiple falls at home Restrictions Weight Bearing Restrictions: No      Mobility  Bed Mobility Overal bed mobility: Needs Assistance Bed Mobility: Supine to Sit;Sit to Supine     Supine to sit: Mod assist Sit to supine: Mod assist   General bed mobility comments: Mod A for trunk elevation and LE assist. Required assist for LE for return to supine.   Transfers Overall transfer level: Needs assistance Equipment used: 2 person hand held assist Transfers: Sit to/from Stand Sit to Stand: Mod assist;+2 physical assistance         General transfer comment: Mod A +2 for lift assist and steadying.   Ambulation/Gait Ambulation/Gait assistance: Mod assist;+2 physical assistance   Assistive device: 2 person hand held assist       General Gait Details: Performed side steps at EOB. Pt requiring mod A +2 for steadying.   Stairs            Wheelchair Mobility    Modified Rankin (Stroke Patients Only)       Balance Overall balance assessment: Needs  assistance Sitting-balance support: Feet supported;No upper extremity supported Sitting balance-Leahy Scale: Fair     Standing balance support: Bilateral upper extremity supported;During functional activity Standing balance-Leahy Scale: Poor Standing balance comment: Reliant on UE and external support                              Pertinent Vitals/Pain Pain Assessment: No/denies pain    Home Living Family/patient expects to be discharged to:: Private residence Living Arrangements: Spouse/significant other Available Help at Discharge: Family;Available 24 hours/day Type of Home: House Home Access: Stairs to enter Entrance Stairs-Rails: None Entrance Stairs-Number of Steps: 1 Home Layout: One level Home Equipment: Walker - 2 wheels;Walker - 4 wheels;Cane - single point;Shower seat;Grab bars - tub/shower      Prior Function Level of Independence: Independent with assistive device(s)   Gait / Transfers Assistance Needed: Uses RW for ambulatino, however, reports a lot of falls           Hand Dominance        Extremity/Trunk Assessment   Upper Extremity Assessment Upper Extremity Assessment: Generalized weakness;RUE deficits/detail;LUE deficits/detail RUE Deficits / Details: Swelling noted In RUE LUE Deficits / Details: Increased swelling noted    Lower Extremity Assessment Lower Extremity Assessment: Generalized weakness       Communication   Communication: No difficulties  Cognition Arousal/Alertness: Awake/alert Behavior During Therapy: WFL for tasks assessed/performed Overall Cognitive Status: No family/caregiver present to determine baseline cognitive functioning  General Comments      Exercises     Assessment/Plan    PT Assessment Patient needs continued PT services  PT Problem List Decreased strength;Decreased mobility;Decreased balance;Decreased activity tolerance;Decreased  cognition;Decreased safety awareness       PT Treatment Interventions Therapeutic activities;Gait training;Therapeutic exercise;Patient/family education;Balance training;Functional mobility training;Cognitive remediation;DME instruction    PT Goals (Current goals can be found in the Care Plan section)  Acute Rehab PT Goals Patient Stated Goal: to stop falling PT Goal Formulation: With patient Time For Goal Achievement: 11/28/19 Potential to Achieve Goals: Good    Frequency Min 2X/week   Barriers to discharge        Co-evaluation               AM-PAC PT "6 Clicks" Mobility  Outcome Measure Help needed turning from your back to your side while in a flat bed without using bedrails?: A Lot Help needed moving from lying on your back to sitting on the side of a flat bed without using bedrails?: A Lot Help needed moving to and from a bed to a chair (including a wheelchair)?: A Lot Help needed standing up from a chair using your arms (e.g., wheelchair or bedside chair)?: A Lot Help needed to walk in hospital room?: A Lot Help needed climbing 3-5 steps with a railing? : Total 6 Click Score: 11    End of Session Equipment Utilized During Treatment: Gait belt Activity Tolerance: Patient tolerated treatment well Patient left: with call bell/phone within reach;in bed Nurse Communication: Mobility status PT Visit Diagnosis: Muscle weakness (generalized) (M62.81);Unsteadiness on feet (R26.81);Difficulty in walking, not elsewhere classified (R26.2);History of falling (Z91.81);Repeated falls (R29.6)    Time: 6440-3474 PT Time Calculation (min) (ACUTE ONLY): 13 min   Charges:   PT Evaluation $PT Eval Moderate Complexity: 1 Mod          Reuel Derby, PT, DPT  Acute Rehabilitation Services  Pager: [phone] Office: [phone]   Rudean Hitt 11/14/2019, 1:50 PM

## 2019-11-14 NOTE — Telephone Encounter (Signed)
Attempted to reach pt again but no answer. Called the mobile number listed for the pt and her son answered. He states his mother is at the hospital again. He reports she has to go to the hospital about every 3 weeks and sometimes gets blood. Per ER note it mentions possibly discharging pt to a skilled nursing facility. Dr. Henrene Pastor notified.

## 2019-11-14 NOTE — ED Provider Notes (Signed)
Physical therapy and transitional management recommend d/c. VSS.    Sherwood Gambler, MD 11/14/19 [phone]

## 2019-11-14 NOTE — ED Notes (Signed)
Pt arrived hypotensive and continues to remain hypotensive. Dr. Betsey Holiday made aware.

## 2019-11-15 DIAGNOSIS — D509 Iron deficiency anemia, unspecified: Secondary | ICD-10-CM | POA: Diagnosis not present

## 2019-11-15 DIAGNOSIS — N2581 Secondary hyperparathyroidism of renal origin: Secondary | ICD-10-CM | POA: Diagnosis not present

## 2019-11-15 DIAGNOSIS — D631 Anemia in chronic kidney disease: Secondary | ICD-10-CM | POA: Diagnosis not present

## 2019-11-15 DIAGNOSIS — E1129 Type 2 diabetes mellitus with other diabetic kidney complication: Secondary | ICD-10-CM | POA: Diagnosis not present

## 2019-11-15 DIAGNOSIS — N186 End stage renal disease: Secondary | ICD-10-CM | POA: Diagnosis not present

## 2019-11-15 DIAGNOSIS — Z992 Dependence on renal dialysis: Secondary | ICD-10-CM | POA: Diagnosis not present

## 2019-11-16 ENCOUNTER — Other Ambulatory Visit: Payer: Medicare Other | Admitting: *Deleted

## 2019-11-16 ENCOUNTER — Other Ambulatory Visit: Payer: Self-pay

## 2019-11-16 DIAGNOSIS — Z515 Encounter for palliative care: Secondary | ICD-10-CM

## 2019-11-16 NOTE — Progress Notes (Signed)
[facility]ARE RN NOTE  PATIENT NAME: Samantha Mckenzie DOB: 04-Jul-1936 MRN: [id number]  PRIMARY CARE PROVIDER: Colon Branch, MD  RESPONSIBLE PARTY: Samantha Mckenzie (husband) Acct ID - Guarantor Home Phone Work Phone Relationship Acct Type  [id number] ARIAH, [id number]  Self P/F     [address] [place], [place] [address]   Covid-19 Pre-screening Negative  PLAN OF CARE and INTERVENTION:  1. ADVANCE CARE PLANNING/GOALS OF CARE: Goal is for patient to remain at home with her husband. She has a DNR. 2. PATIENT/CAREGIVER EDUCATION: Hospice Education, Symptom Management 3. DISEASE STATUS: Met with patient and her husband, Samantha Mckenzie, in their home. Upon arrival, patient answered the door. She remains ambulatory using her walker, but is very weak, tired and pale in appearance. She reports having increasing difficulties standing and getting out of her bed. Her husband is assisting her with bathing and dressing. She is having more instances of shortness of breath with exertion. She went to the [facility] ED on 11/13/19 d/t weakness, headache and shortness of breath, and was released back home the next day once stable. No changes were made to her medications or plan of care. She continues dialysis treatments on Tuesday, Thursday, Saturday. She became tearful today during conversation, as she is contemplating more and more whether or not she wants to continue dialysis. She continues with blood noted in her stool. She has pitting edema to bilateral lower extremities, and swelling to her left arm/hand. Her son has arranged for a CNA and RN for patient to assist with personal care, household chores and their RN will be taking over filling patient's pill box starting next week. I filled patient's pill box today and left the medication list underneath the pill box. I spoke with the DON of [facility], Sonia Baller, to discuss patient's medication list. I also spoke with patient's son to provide patient  update. Will continue to monitor.     CODE STATUS: DNR  ADVANCED DIRECTIVES: Y MOST FORM: yes PPS: 40%   PHYSICAL EXAM:   LUNGS: clear to auscultation  CARDIAC: Cor RRR EXTREMITIES: pitting edema bilateral lower extremities, swelling to left arm/hand SKIN: Skin is thin/frail, pale appearance, scattered bruising noted to bilateral arms, right upper arm fistula WNL  NEURO: Alert and oriented x 3, forgeful, intermittent confusion, ambulatory w/walker   (Duration of visit and documentation 75 minutes)   Daryl Eastern, RN BSN

## 2019-11-17 ENCOUNTER — Encounter: Payer: Self-pay | Admitting: Internal Medicine

## 2019-11-19 ENCOUNTER — Telehealth: Payer: Self-pay | Admitting: *Deleted

## 2019-11-19 NOTE — Telephone Encounter (Signed)
Received a phone call from patient's son, Divina Neale, requesting a hospice consult for this patient. He states that she has decided to discontinue her dialysis treatments. Her last dialysis treatment to his knowledge was 11/15/19 so she did not go to her scheduled treatment on 11/17/19. He also states that he has spoken to Dr. Larose Kells about this and that he agrees to hospice. I sent a message to Dr. Larose Kells via Epic requesting an order for a hospice consult. Dr. Larose Kells replied and gave the order for a hospice consult and agreed to be her Attending MD while she is under hospice care. Information will be forwarded to [facility] referral center to set up date/time for hospice Admission visit. Communication sent to patient's son to advise of this information as well.

## 2019-11-21 DIAGNOSIS — Z6821 Body mass index (BMI) 21.0-21.9, adult: Secondary | ICD-10-CM | POA: Diagnosis not present

## 2019-11-21 DIAGNOSIS — E1122 Type 2 diabetes mellitus with diabetic chronic kidney disease: Secondary | ICD-10-CM | POA: Diagnosis not present

## 2019-11-21 DIAGNOSIS — K50011 Crohn's disease of small intestine with rectal bleeding: Secondary | ICD-10-CM | POA: Diagnosis not present

## 2019-11-21 DIAGNOSIS — K219 Gastro-esophageal reflux disease without esophagitis: Secondary | ICD-10-CM | POA: Diagnosis not present

## 2019-11-21 DIAGNOSIS — D631 Anemia in chronic kidney disease: Secondary | ICD-10-CM | POA: Diagnosis not present

## 2019-11-21 DIAGNOSIS — E785 Hyperlipidemia, unspecified: Secondary | ICD-10-CM | POA: Diagnosis not present

## 2019-11-21 DIAGNOSIS — I5032 Chronic diastolic (congestive) heart failure: Secondary | ICD-10-CM | POA: Diagnosis not present

## 2019-11-21 DIAGNOSIS — I132 Hypertensive heart and chronic kidney disease with heart failure and with stage 5 chronic kidney disease, or end stage renal disease: Secondary | ICD-10-CM | POA: Diagnosis not present

## 2019-11-21 DIAGNOSIS — K922 Gastrointestinal hemorrhage, unspecified: Secondary | ICD-10-CM | POA: Diagnosis not present

## 2019-11-21 DIAGNOSIS — I4891 Unspecified atrial fibrillation: Secondary | ICD-10-CM | POA: Diagnosis not present

## 2019-11-21 DIAGNOSIS — M109 Gout, unspecified: Secondary | ICD-10-CM | POA: Diagnosis not present

## 2019-11-21 DIAGNOSIS — N186 End stage renal disease: Secondary | ICD-10-CM | POA: Diagnosis not present

## 2019-11-21 DIAGNOSIS — Z853 Personal history of malignant neoplasm of breast: Secondary | ICD-10-CM | POA: Diagnosis not present

## 2019-11-22 DIAGNOSIS — N186 End stage renal disease: Secondary | ICD-10-CM | POA: Diagnosis not present

## 2019-11-22 DIAGNOSIS — I5032 Chronic diastolic (congestive) heart failure: Secondary | ICD-10-CM | POA: Diagnosis not present

## 2019-11-22 DIAGNOSIS — I132 Hypertensive heart and chronic kidney disease with heart failure and with stage 5 chronic kidney disease, or end stage renal disease: Secondary | ICD-10-CM | POA: Diagnosis not present

## 2019-11-22 DIAGNOSIS — E1122 Type 2 diabetes mellitus with diabetic chronic kidney disease: Secondary | ICD-10-CM | POA: Diagnosis not present

## 2019-11-22 DIAGNOSIS — I4891 Unspecified atrial fibrillation: Secondary | ICD-10-CM | POA: Diagnosis not present

## 2019-11-22 DIAGNOSIS — D631 Anemia in chronic kidney disease: Secondary | ICD-10-CM | POA: Diagnosis not present

## 2019-11-26 DIAGNOSIS — N186 End stage renal disease: Secondary | ICD-10-CM | POA: Diagnosis not present

## 2019-11-26 DIAGNOSIS — I132 Hypertensive heart and chronic kidney disease with heart failure and with stage 5 chronic kidney disease, or end stage renal disease: Secondary | ICD-10-CM | POA: Diagnosis not present

## 2019-11-26 DIAGNOSIS — E1122 Type 2 diabetes mellitus with diabetic chronic kidney disease: Secondary | ICD-10-CM | POA: Diagnosis not present

## 2019-11-26 DIAGNOSIS — D631 Anemia in chronic kidney disease: Secondary | ICD-10-CM | POA: Diagnosis not present

## 2019-11-26 DIAGNOSIS — I4891 Unspecified atrial fibrillation: Secondary | ICD-10-CM | POA: Diagnosis not present

## 2019-11-26 DIAGNOSIS — I5032 Chronic diastolic (congestive) heart failure: Secondary | ICD-10-CM | POA: Diagnosis not present

## 2019-11-27 ENCOUNTER — Other Ambulatory Visit: Payer: Self-pay | Admitting: *Deleted

## 2019-11-27 NOTE — Patient Outreach (Signed)
[facility]) Care Management  11/27/2019  MAKELLA BUCKINGHAM 1936/04/14 [id number]   Call placed to [facility] to follow up on admission to hospice.  Voice message was left, call received back from [facility].  Confirmed that member was admitted to the hospice program on 2/10.  Will close case at this time, will notify MD of closure.  Valente David, [place], MSN [place] [phone]

## 2019-11-28 DIAGNOSIS — I132 Hypertensive heart and chronic kidney disease with heart failure and with stage 5 chronic kidney disease, or end stage renal disease: Secondary | ICD-10-CM | POA: Diagnosis not present

## 2019-11-28 DIAGNOSIS — I4891 Unspecified atrial fibrillation: Secondary | ICD-10-CM | POA: Diagnosis not present

## 2019-11-28 DIAGNOSIS — E1122 Type 2 diabetes mellitus with diabetic chronic kidney disease: Secondary | ICD-10-CM | POA: Diagnosis not present

## 2019-11-28 DIAGNOSIS — I5032 Chronic diastolic (congestive) heart failure: Secondary | ICD-10-CM | POA: Diagnosis not present

## 2019-11-28 DIAGNOSIS — D631 Anemia in chronic kidney disease: Secondary | ICD-10-CM | POA: Diagnosis not present

## 2019-11-28 DIAGNOSIS — N186 End stage renal disease: Secondary | ICD-10-CM | POA: Diagnosis not present

## 2019-11-30 DIAGNOSIS — E1122 Type 2 diabetes mellitus with diabetic chronic kidney disease: Secondary | ICD-10-CM | POA: Diagnosis not present

## 2019-11-30 DIAGNOSIS — N186 End stage renal disease: Secondary | ICD-10-CM | POA: Diagnosis not present

## 2019-11-30 DIAGNOSIS — I4891 Unspecified atrial fibrillation: Secondary | ICD-10-CM | POA: Diagnosis not present

## 2019-11-30 DIAGNOSIS — D631 Anemia in chronic kidney disease: Secondary | ICD-10-CM | POA: Diagnosis not present

## 2019-11-30 DIAGNOSIS — I5032 Chronic diastolic (congestive) heart failure: Secondary | ICD-10-CM | POA: Diagnosis not present

## 2019-11-30 DIAGNOSIS — I132 Hypertensive heart and chronic kidney disease with heart failure and with stage 5 chronic kidney disease, or end stage renal disease: Secondary | ICD-10-CM | POA: Diagnosis not present

## 2019-12-01 DIAGNOSIS — I132 Hypertensive heart and chronic kidney disease with heart failure and with stage 5 chronic kidney disease, or end stage renal disease: Secondary | ICD-10-CM | POA: Diagnosis not present

## 2019-12-01 DIAGNOSIS — I4891 Unspecified atrial fibrillation: Secondary | ICD-10-CM | POA: Diagnosis not present

## 2019-12-01 DIAGNOSIS — N186 End stage renal disease: Secondary | ICD-10-CM | POA: Diagnosis not present

## 2019-12-01 DIAGNOSIS — E1122 Type 2 diabetes mellitus with diabetic chronic kidney disease: Secondary | ICD-10-CM | POA: Diagnosis not present

## 2019-12-01 DIAGNOSIS — I5032 Chronic diastolic (congestive) heart failure: Secondary | ICD-10-CM | POA: Diagnosis not present

## 2019-12-01 DIAGNOSIS — D631 Anemia in chronic kidney disease: Secondary | ICD-10-CM | POA: Diagnosis not present

## 2019-12-03 ENCOUNTER — Telehealth: Payer: Self-pay | Admitting: Internal Medicine

## 2019-12-03 NOTE — Telephone Encounter (Signed)
Samantha Mckenzie informed Death certificate ready for pick up at front desk. Copy sent for scanning.

## 2019-12-03 NOTE — Telephone Encounter (Signed)
Death certificate w/ PCP.

## 2019-12-03 NOTE — Telephone Encounter (Signed)
Called, no answer.

## 2019-12-03 NOTE — Telephone Encounter (Signed)
[place] dropped off document (death certificate) to be filled out, please call [facility] at [phone] when ready to pick up. Document given tp CMA.

## 2019-12-03 NOTE — Telephone Encounter (Signed)
Mr. Couvillon called and wanted Dr. Larose Kells to call him back. States Mrs Mcandrew passed away on 12-19-2022 AT  4PM  Mr. Pixley really would like Dr. Larose Kells to call him back. Thanks  [phone]

## 2019-12-03 NOTE — Telephone Encounter (Signed)
Spoke with the patient, condolences provided, I encouraged him to reach out for help if he has difficulty with the grieving process. He does have doctor. He also has the support of his children. It was a very nice conversation. He knows to call back anytime if he has questions.

## 2019-12-10 DEATH — deceased

## 2019-12-12 ENCOUNTER — Ambulatory Visit: Payer: Medicare Other | Admitting: Internal Medicine

## 2019-12-17 IMAGING — CT CT HEAD W/O CM
4 series · 16 of 47 positions shown, 18 images · non-contrast
Comparison: 05/30/2019

CLINICAL DATA: Patient became dizzy and fell. Possible brief loss
of consciousness. Patient hit the back of her head.

EXAM:
CT HEAD WITHOUT CONTRAST
TECHNIQUE: Contiguous axial images were obtained from the base of the skull
through the vertex without intravenous contrast.

[Series 3: head bone · axial · 0.43mm/px · z∈[-88,-56]mm · 3 of 80 slices shown]
[im 8/80  bone]
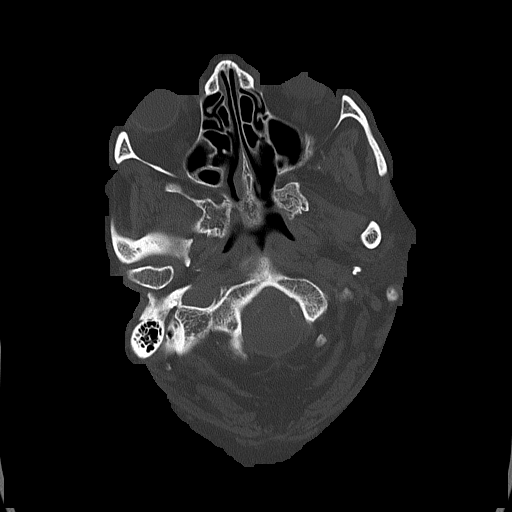
[im 16/80  bone]
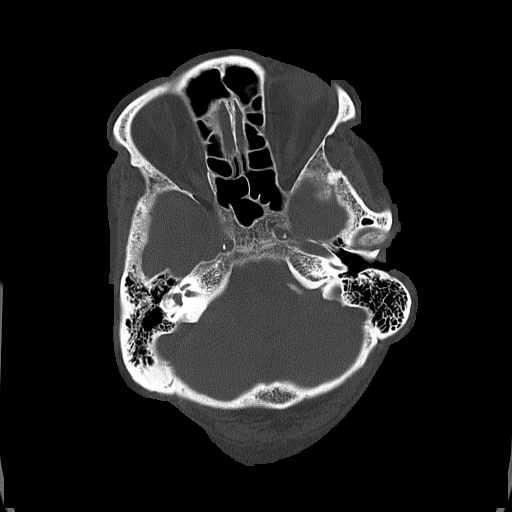
[im 24/80  bone]
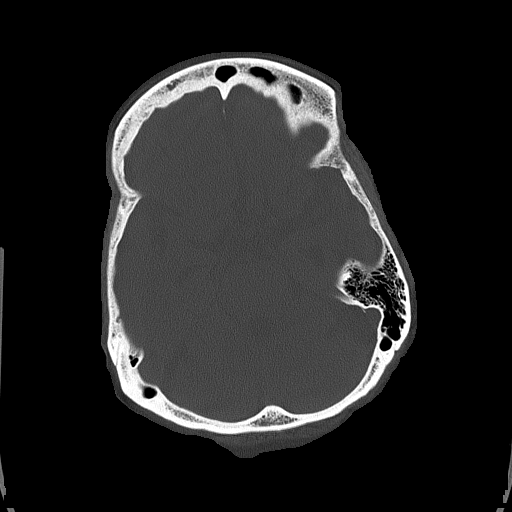

[Series 4: head without cor · coronal · non-contrast · 0.31mm/px · 3 of 67 slices shown]
[im 23/67  brain]
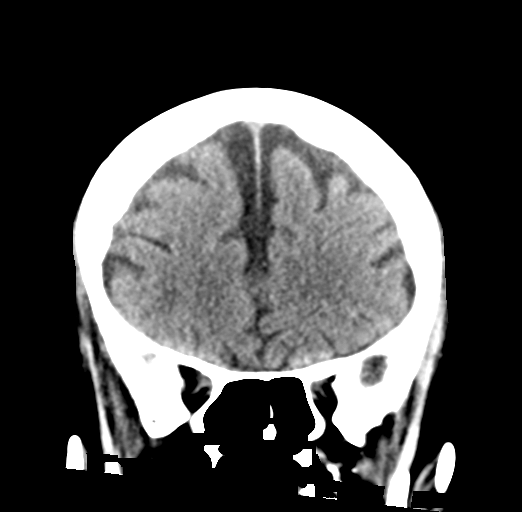
[im 30/67  brain]
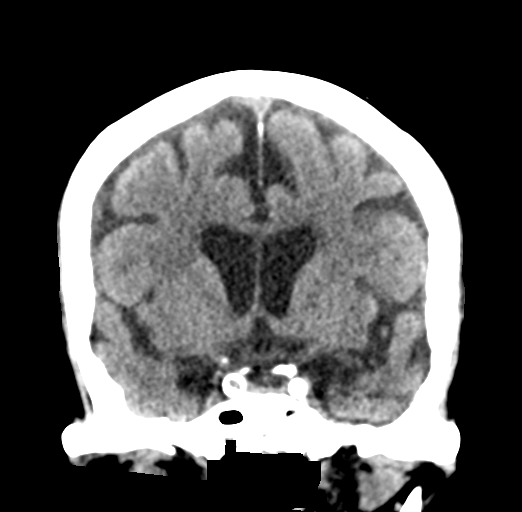
[im 37/67  brain]
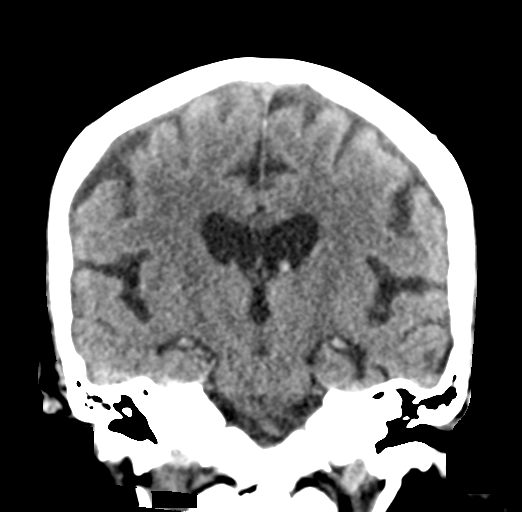

[Series 5: head without · axial · non-contrast · 0.43mm/px · z∈[-87,+33]mm · 7 of 32 slices shown, 9 images]
[im 4/32  brain]
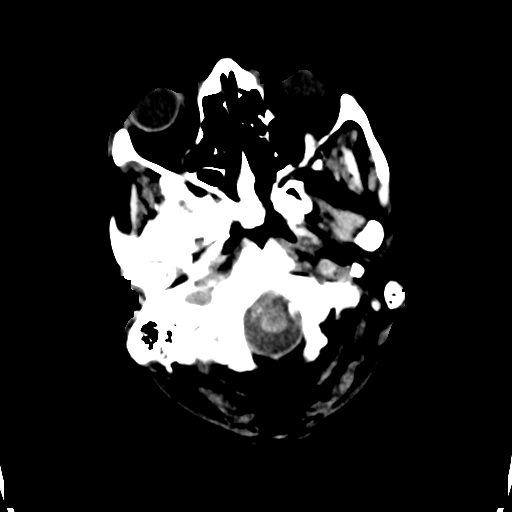
[im 4/32  bone]
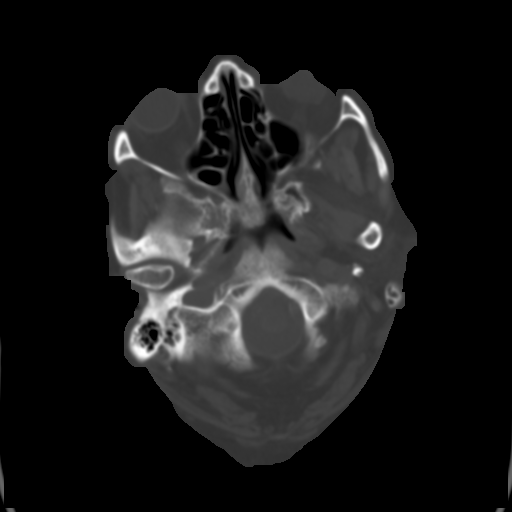
[im 8/32  brain]
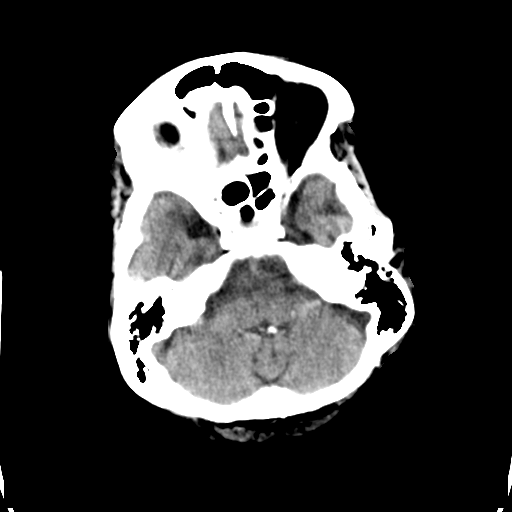
[im 12/32  brain]
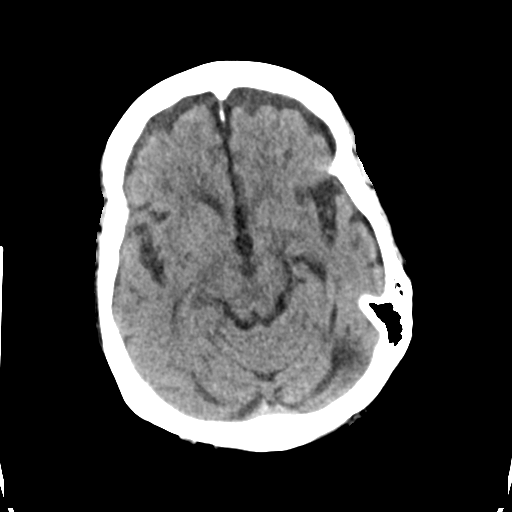
[im 16/32  brain]
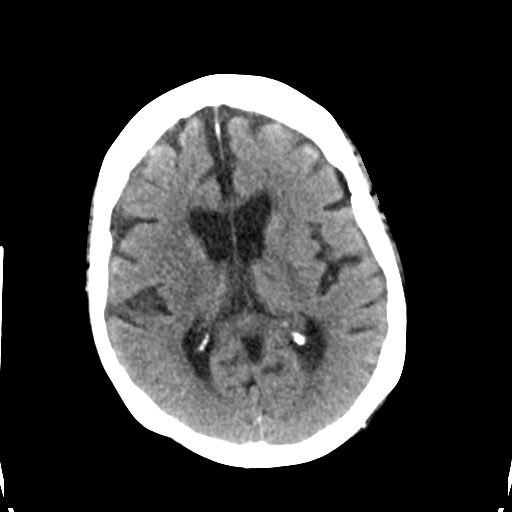
[im 20/32  brain]
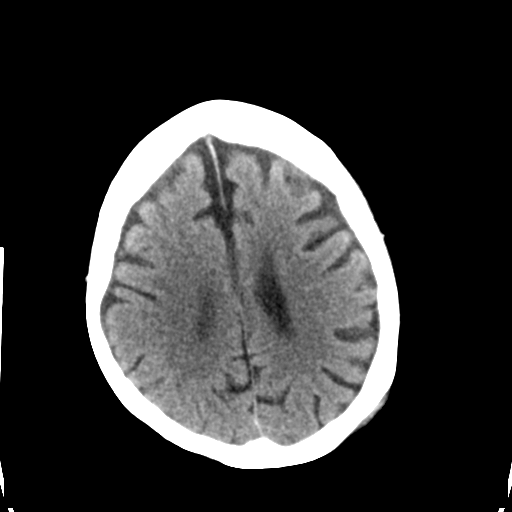
[im 20/32  bone]
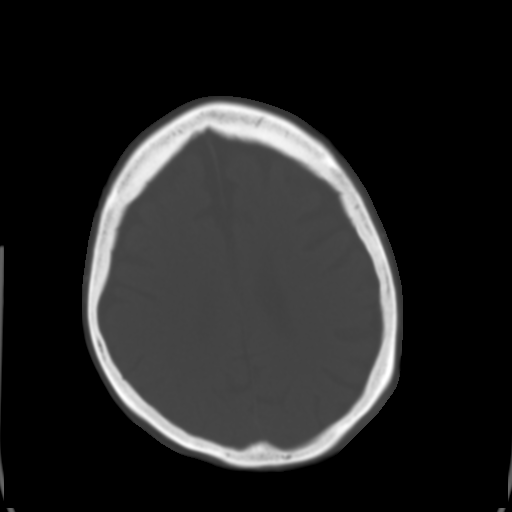
[im 24/32  brain]
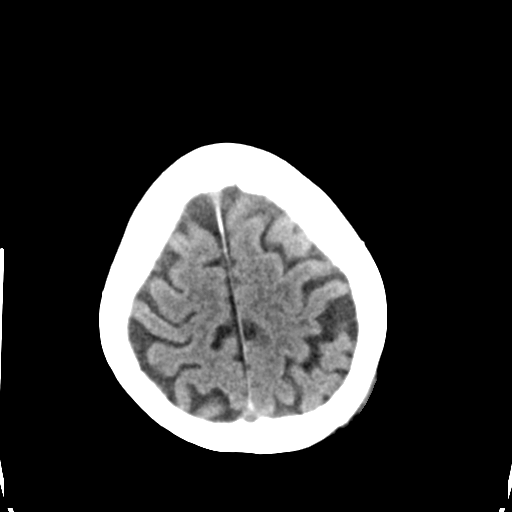
[im 28/32  brain]
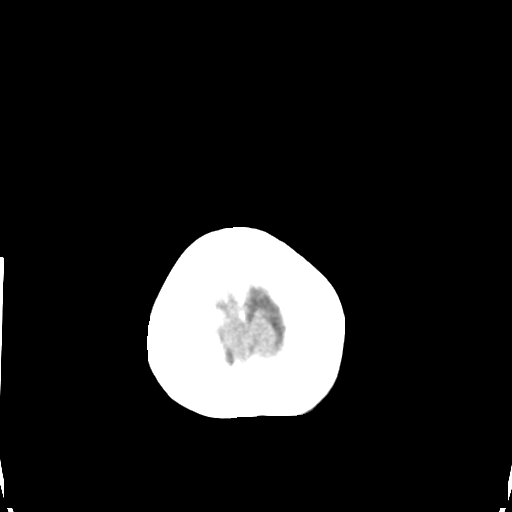

[Series 6: head without sag · sagittal · non-contrast · 0.31mm/px · 3 of 50 slices shown]
[im 17/50  brain]
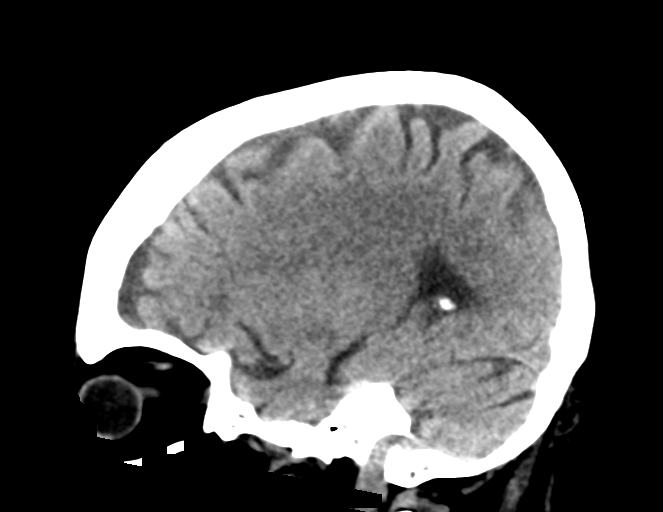
[im 25/50  brain]
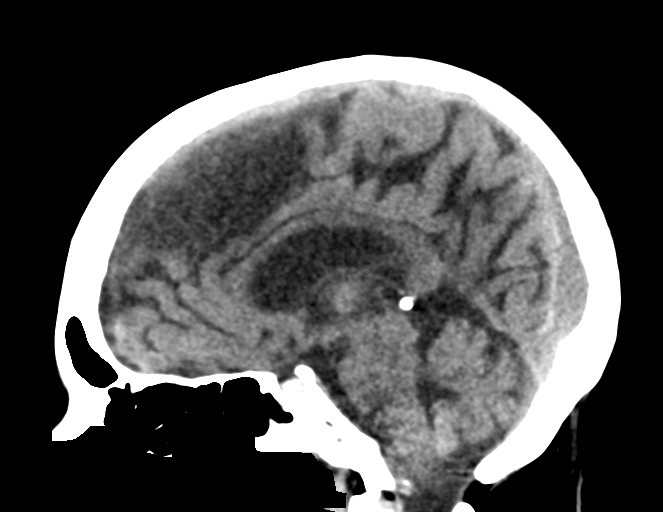
[im 33/50  brain]
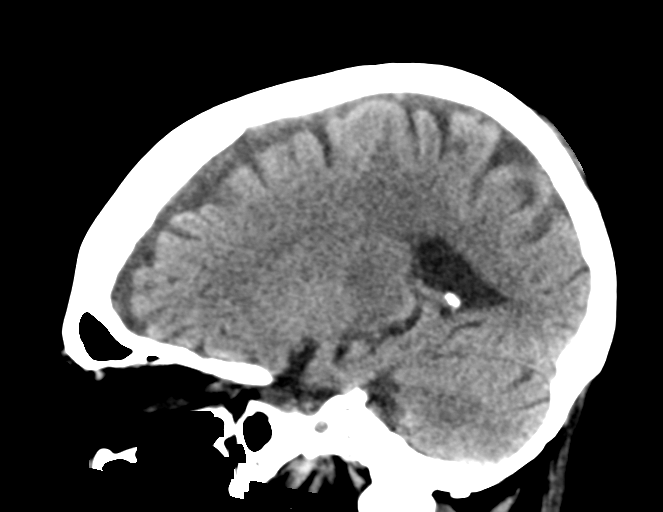

[16 of 47 positions shown; findings below may reference images not displayed]

FINDINGS: Brain: No evidence of acute infarction, hemorrhage, hydrocephalus,
extra-axial collection or mass lesion/mass effect.

There is mild ventricular sulcal enlargement reflecting age related
volume loss. Mild periventricular white matter hypoattenuation is
also noted consistent with chronic microvascular ischemic change.

Vascular: No hyperdense vessel or unexpected calcification.

Skull: Normal. Negative for fracture or focal lesion.

Sinuses/Orbits: Globes and orbits are unremarkable. The visualized
sinuses and mastoid air cells are clear.

Other: Small left parietal scalp hematoma.
IMPRESSION: 1. No acute intracranial abnormalities.
2. Age related volume loss. Mild chronic microvascular ischemic
change.
3. Small left parietal scalp hematoma.

## 2020-01-23 ENCOUNTER — Ambulatory Visit: Payer: Medicare Other | Admitting: Internal Medicine
